# Patient Record
Sex: Female | Born: 1940 | Race: White | Hispanic: No | Smoking: Former smoker
Health system: Southern US, Community
[De-identification: ages and names within clinical notes are randomized; demographics above are authoritative.]

## PROBLEM LIST (undated history)

## (undated) DIAGNOSIS — Z7901 Long term (current) use of anticoagulants: Secondary | ICD-10-CM

## (undated) DIAGNOSIS — M8580 Other specified disorders of bone density and structure, unspecified site: Secondary | ICD-10-CM

## (undated) DIAGNOSIS — R29898 Other symptoms and signs involving the musculoskeletal system: Secondary | ICD-10-CM

## (undated) DIAGNOSIS — E559 Vitamin D deficiency, unspecified: Secondary | ICD-10-CM

## (undated) DIAGNOSIS — K2289 Other specified disease of esophagus: Secondary | ICD-10-CM

## (undated) DIAGNOSIS — T8859XA Other complications of anesthesia, initial encounter: Secondary | ICD-10-CM

## (undated) DIAGNOSIS — N390 Urinary tract infection, site not specified: Secondary | ICD-10-CM

## (undated) DIAGNOSIS — Z9989 Dependence on other enabling machines and devices: Secondary | ICD-10-CM

## (undated) DIAGNOSIS — R0602 Shortness of breath: Secondary | ICD-10-CM

## (undated) DIAGNOSIS — I5189 Other ill-defined heart diseases: Secondary | ICD-10-CM

## (undated) DIAGNOSIS — Z950 Presence of cardiac pacemaker: Secondary | ICD-10-CM

## (undated) DIAGNOSIS — F509 Eating disorder, unspecified: Secondary | ICD-10-CM

## (undated) DIAGNOSIS — J8410 Pulmonary fibrosis, unspecified: Secondary | ICD-10-CM

## (undated) DIAGNOSIS — R7689 Other specified abnormal immunological findings in serum: Secondary | ICD-10-CM

## (undated) DIAGNOSIS — R918 Other nonspecific abnormal finding of lung field: Secondary | ICD-10-CM

## (undated) DIAGNOSIS — G4733 Obstructive sleep apnea (adult) (pediatric): Secondary | ICD-10-CM

## (undated) DIAGNOSIS — I455 Other specified heart block: Secondary | ICD-10-CM

## (undated) DIAGNOSIS — M549 Dorsalgia, unspecified: Secondary | ICD-10-CM

## (undated) DIAGNOSIS — K219 Gastro-esophageal reflux disease without esophagitis: Secondary | ICD-10-CM

## (undated) DIAGNOSIS — I4891 Unspecified atrial fibrillation: Secondary | ICD-10-CM

## (undated) DIAGNOSIS — Z148 Genetic carrier of other disease: Secondary | ICD-10-CM

## (undated) DIAGNOSIS — M1990 Unspecified osteoarthritis, unspecified site: Secondary | ICD-10-CM

## (undated) DIAGNOSIS — R768 Other specified abnormal immunological findings in serum: Secondary | ICD-10-CM

## (undated) DIAGNOSIS — I700 Atherosclerosis of aorta: Secondary | ICD-10-CM

## (undated) DIAGNOSIS — F32A Depression, unspecified: Secondary | ICD-10-CM

## (undated) DIAGNOSIS — I495 Sick sinus syndrome: Secondary | ICD-10-CM

## (undated) DIAGNOSIS — I480 Paroxysmal atrial fibrillation: Secondary | ICD-10-CM

## (undated) DIAGNOSIS — E88819 Insulin resistance, unspecified: Secondary | ICD-10-CM

## (undated) DIAGNOSIS — G459 Transient cerebral ischemic attack, unspecified: Secondary | ICD-10-CM

## (undated) DIAGNOSIS — E669 Obesity, unspecified: Secondary | ICD-10-CM

## (undated) DIAGNOSIS — I341 Nonrheumatic mitral (valve) prolapse: Secondary | ICD-10-CM

## (undated) DIAGNOSIS — K228 Other specified diseases of esophagus: Secondary | ICD-10-CM

## (undated) DIAGNOSIS — I493 Ventricular premature depolarization: Secondary | ICD-10-CM

## (undated) DIAGNOSIS — T4145XA Adverse effect of unspecified anesthetic, initial encounter: Secondary | ICD-10-CM

## (undated) DIAGNOSIS — I639 Cerebral infarction, unspecified: Secondary | ICD-10-CM

## (undated) DIAGNOSIS — I519 Heart disease, unspecified: Secondary | ICD-10-CM

## (undated) DIAGNOSIS — M19041 Primary osteoarthritis, right hand: Secondary | ICD-10-CM

## (undated) DIAGNOSIS — E8881 Metabolic syndrome: Secondary | ICD-10-CM

## (undated) DIAGNOSIS — G4730 Sleep apnea, unspecified: Secondary | ICD-10-CM

## (undated) DIAGNOSIS — M19042 Primary osteoarthritis, left hand: Secondary | ICD-10-CM

## (undated) DIAGNOSIS — E781 Pure hyperglyceridemia: Secondary | ICD-10-CM

## (undated) DIAGNOSIS — I10 Essential (primary) hypertension: Secondary | ICD-10-CM

## (undated) HISTORY — DX: Vitamin D deficiency, unspecified: E55.9

## (undated) HISTORY — DX: Presence of cardiac pacemaker: Z95.0

## (undated) HISTORY — DX: Long term (current) use of anticoagulants: Z79.01

## (undated) HISTORY — DX: Other specified disorders of bone density and structure, unspecified site: M85.80

## (undated) HISTORY — DX: Paroxysmal atrial fibrillation: I48.0

## (undated) HISTORY — DX: Other symptoms and signs involving the musculoskeletal system: R29.898

## (undated) HISTORY — DX: Pure hyperglyceridemia: E78.1

## (undated) HISTORY — DX: Pulmonary fibrosis, unspecified: J84.10

## (undated) HISTORY — DX: Depression, unspecified: F32.A

## (undated) HISTORY — DX: Obstructive sleep apnea (adult) (pediatric): G47.33

## (undated) HISTORY — PX: ESOPHAGEAL DILATION: SHX303

## (undated) HISTORY — DX: Shortness of breath: R06.02

## (undated) HISTORY — PX: TUBAL LIGATION: SHX77

## (undated) HISTORY — DX: Genetic carrier of other disease: Z14.8

## (undated) HISTORY — DX: Dependence on other enabling machines and devices: Z99.89

## (undated) HISTORY — DX: Dorsalgia, unspecified: M54.9

## (undated) HISTORY — DX: Other specified abnormal immunological findings in serum: R76.89

## (undated) HISTORY — DX: Other nonspecific abnormal finding of lung field: R91.8

## (undated) HISTORY — DX: Sleep apnea, unspecified: G47.30

## (undated) HISTORY — DX: Ventricular premature depolarization: I49.3

## (undated) HISTORY — DX: Other ill-defined heart diseases: I51.89

## (undated) HISTORY — PX: EXCISION VAGINAL CYST: SHX5825

## (undated) HISTORY — DX: Atherosclerosis of aorta: I70.0

## (undated) HISTORY — DX: Unspecified osteoarthritis, unspecified site: M19.90

## (undated) HISTORY — DX: Insulin resistance, unspecified: E88.819

## (undated) HISTORY — DX: Primary osteoarthritis, right hand: M19.041

## (undated) HISTORY — DX: Obesity, unspecified: E66.9

## (undated) HISTORY — DX: Eating disorder, unspecified: F50.9

## (undated) HISTORY — DX: Nonrheumatic mitral (valve) prolapse: I34.1

## (undated) HISTORY — DX: Heart disease, unspecified: I51.9

## (undated) HISTORY — DX: Primary osteoarthritis, right hand: M19.042

## (undated) HISTORY — DX: Metabolic syndrome: E88.81

## (undated) HISTORY — DX: Other specified disease of esophagus: K22.89

## (undated) HISTORY — DX: Other specified abnormal immunological findings in serum: R76.8

## (undated) HISTORY — DX: Transient cerebral ischemic attack, unspecified: G45.9

---
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
## 1998-10-31 ENCOUNTER — Other Ambulatory Visit: Admission: RE | Admit: 1998-10-31 | Discharge: 1998-10-31 | Payer: Self-pay | Admitting: Obstetrics & Gynecology

## 1999-08-10 ENCOUNTER — Ambulatory Visit (HOSPITAL_COMMUNITY): Admission: RE | Admit: 1999-08-10 | Discharge: 1999-08-10 | Payer: Self-pay | Admitting: Gastroenterology

## 1999-09-27 ENCOUNTER — Encounter (INDEPENDENT_AMBULATORY_CARE_PROVIDER_SITE_OTHER): Payer: Self-pay

## 1999-09-27 ENCOUNTER — Other Ambulatory Visit: Admission: RE | Admit: 1999-09-27 | Discharge: 1999-09-27 | Payer: Self-pay | Admitting: Obstetrics & Gynecology

## 2000-06-10 ENCOUNTER — Other Ambulatory Visit: Admission: RE | Admit: 2000-06-10 | Discharge: 2000-06-10 | Payer: Self-pay | Admitting: Obstetrics & Gynecology

## 2000-08-29 ENCOUNTER — Other Ambulatory Visit: Admission: RE | Admit: 2000-08-29 | Discharge: 2000-08-29 | Payer: Self-pay | Admitting: Gastroenterology

## 2000-09-02 ENCOUNTER — Other Ambulatory Visit: Admission: RE | Admit: 2000-09-02 | Discharge: 2000-09-02 | Payer: Self-pay | Admitting: Obstetrics and Gynecology

## 2001-07-28 ENCOUNTER — Other Ambulatory Visit: Admission: RE | Admit: 2001-07-28 | Discharge: 2001-07-28 | Payer: Self-pay | Admitting: Obstetrics & Gynecology

## 2003-01-11 ENCOUNTER — Other Ambulatory Visit: Admission: RE | Admit: 2003-01-11 | Discharge: 2003-01-11 | Payer: Self-pay | Admitting: Obstetrics & Gynecology

## 2004-01-16 ENCOUNTER — Other Ambulatory Visit: Admission: RE | Admit: 2004-01-16 | Discharge: 2004-01-16 | Payer: Self-pay | Admitting: Obstetrics & Gynecology

## 2004-03-05 ENCOUNTER — Encounter: Admission: RE | Admit: 2004-03-05 | Discharge: 2004-03-05 | Payer: Self-pay | Admitting: Obstetrics & Gynecology

## 2005-01-30 ENCOUNTER — Other Ambulatory Visit: Admission: RE | Admit: 2005-01-30 | Discharge: 2005-01-30 | Payer: Self-pay | Admitting: Obstetrics & Gynecology

## 2005-05-09 ENCOUNTER — Ambulatory Visit: Payer: Self-pay | Admitting: Cardiology

## 2006-08-01 ENCOUNTER — Ambulatory Visit: Payer: Self-pay | Admitting: Cardiology

## 2006-09-11 ENCOUNTER — Encounter: Admission: RE | Admit: 2006-09-11 | Discharge: 2006-09-11 | Payer: Self-pay | Admitting: Orthopedic Surgery

## 2007-02-19 ENCOUNTER — Ambulatory Visit: Payer: Self-pay | Admitting: Cardiology

## 2007-10-09 ENCOUNTER — Ambulatory Visit: Payer: Self-pay | Admitting: Internal Medicine

## 2007-10-14 ENCOUNTER — Ambulatory Visit: Payer: Self-pay | Admitting: Internal Medicine

## 2007-10-19 ENCOUNTER — Telehealth: Payer: Self-pay | Admitting: Internal Medicine

## 2007-10-20 ENCOUNTER — Telehealth: Payer: Self-pay | Admitting: Internal Medicine

## 2007-12-18 HISTORY — PX: NM MYOCAR PERF WALL MOTION: HXRAD629

## 2008-01-07 DIAGNOSIS — Z8679 Personal history of other diseases of the circulatory system: Secondary | ICD-10-CM | POA: Insufficient documentation

## 2008-01-07 DIAGNOSIS — J40 Bronchitis, not specified as acute or chronic: Secondary | ICD-10-CM

## 2008-01-07 DIAGNOSIS — J209 Acute bronchitis, unspecified: Secondary | ICD-10-CM | POA: Insufficient documentation

## 2008-01-12 ENCOUNTER — Encounter: Payer: Self-pay | Admitting: Internal Medicine

## 2008-07-15 ENCOUNTER — Encounter: Payer: Self-pay | Admitting: Internal Medicine

## 2008-07-19 ENCOUNTER — Encounter: Admission: RE | Admit: 2008-07-19 | Discharge: 2008-07-19 | Payer: Self-pay | Admitting: Gastroenterology

## 2008-08-15 DIAGNOSIS — M129 Arthropathy, unspecified: Secondary | ICD-10-CM | POA: Insufficient documentation

## 2008-09-09 ENCOUNTER — Ambulatory Visit: Payer: Self-pay | Admitting: Pulmonary Disease

## 2010-05-15 HISTORY — PX: US ECHOCARDIOGRAPHY: HXRAD669

## 2010-10-01 ENCOUNTER — Encounter: Admission: RE | Admit: 2010-10-01 | Discharge: 2010-10-01 | Payer: Self-pay | Admitting: Gastroenterology

## 2010-12-22 ENCOUNTER — Encounter: Payer: Self-pay | Admitting: Gastroenterology

## 2011-04-16 NOTE — Assessment & Plan Note (Signed)
[facility]                             PULMONARY OFFICE NOTE   NAME:[name] [name], [name] [name]                        MRN:          [id number]  DATE:10/09/2007                            DOB:          12-14-1940    PROBLEM:  This is a 70 year old woman previously followed at my old  office where she was last seen around 2002 coming now as a new patient  to establish because of bronchitis. She sees Dr. Berneta Sages for primary  care and Dr. Mar Daring for cardiology. She is an alpha-1 heterozygote  (MZ) and her daughter is followed by me for alpha-1 antitrypsin  deficiency with severe chronic obstructive pulmonary disease. The  patient used to smoke, quitting years ago. She has noted increased  shortness of breath in the last 3 to 4 months, such that it has gotten  in the way of her water aerobics. Onset has been gradual and associated  with a nearly 30 pound weight gain. 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 She had a  history of cardiac arrhythmia controlled with Toprol. Occasional  palpitations, not much. Her feet do not swell. There is no chest pain.  She sleeps on one pillow with no increase in nocturia. She may feel a  little heavier in the chest, but nothing specific. Minor congestion in  the last few weeks, mainly in her head, blamed on weather change, and  her right ear hurts her a little. She says that she goes to a gym, but  admits mostly she is just eating more.   MEDICATIONS:  1. Folic acid.  2. MiraLax.  3. Prilosec 20 mg.  4. Toprol 25 mg.  5. Zoloft 25 mg.  6. Esterase.  7. Aspirin 81 mg daily.  8. Omega-3.  9. CoQ 10.  10.Vitamin D.  11.Calcium.  12.Tumeric.   ALLERGIES:  No medication allergy.   OBJECTIVE:  Weight 230 pounds compared with 202 pounds when recorded by  cardiology in March and 192 pounds when reported by cardiology in 2006.  Blood pressure 118/72, pulse 63, room air  saturation 97%. Alert, calm,  apparently comfortable overweight woman. Lung fields are clear.  Diaphragm excursion seems normal. There is no dullness. Heart sounds are  regular without murmur or gallop. I find no neck vein distension, no  peripheral edema, no cyanosis or clubbing. Homan's negative. Tympanic  membranes are little retracted. Heart sounds are slow without murmur.   IMPRESSION:  1. Dyspnea seems mostly related to deconditioning and weight gain.  2. Eustachian dysfunction.   PLAN:  1. Pulmonary function test.  2. Chest x-ray.  3. I emphasized weight loss and regular gradual resumption of exercise      for endurance and weight loss.  4. Flu vaccine discussed and given.  5. Schedule return 3 months, earlier p.r.n.     Clinton D. Annamaria Boots, MD, [facility], [place]  Electronically Signed    CDY/MedQ  DD: 10/10/2007  DT: 10/11/2007  Job #: [id number]   cc:   Berneta Sages,  M.D. 

## 2011-04-19 NOTE — Assessment & Plan Note (Signed)
[facility]                              CARDIOLOGY OFFICE NOTE   NAME:[name], [name] GASSER                        MRN:          [id number]  DATE:08/01/2006                            DOB:          July 23, 1941    [name] [name] returns today for further management of her palpitations.  She  carries a diagnosis of mitral valve prolapse.  Her last echo showed no  prolapse.  She has had a good year.  She states she is very happy but has  gained a considerable amount of weight.  Her weight is up from 192 to 214  from last June.   She denies any chest pain, shortness of breath, or ischemic symptoms.  She  has had very few palpitations.   She is maintained on Toprol XL 25 mg a day.   She wants to have some eye surgery for some eyelid droop on the left.  She  is going to be operated on by Dr. Penni Homans in [place]:  VITAL SIGNS:  Her blood pressure today is 124/60.  Her pulse 56 and regular.  Weight is 214.  NECK:  Carotids are full without JVD.  There is no thyromegaly.  Trachea is  midline.  LUNGS:  Clear.  HEART:  Reveals a regular rate and rhythm without murmur, rub, or gallop, or  a click.  ABDOMEN:  Soft.  EXTREMITIES:  Reveal no edema.  Pulses are intact.   EKG shows a sinus bradycardia otherwise normal.   ASSESSMENT/PLAN:  [name] [name] is doing well.  I have advised her to get her  weight down before she starts becoming glucose intolerant and has other  obesity related issues.  I have cleared her for surgery on her eye.  I will  see her back in a year.                               Thomas C. Verl Blalock, MD, [facility]    TCW/MedQ  DD:  08/01/2006  DT:  08/02/2006  Job #:  [id number]   cc:   Berneta Sages, MD

## 2011-04-19 NOTE — Assessment & Plan Note (Signed)
[facility]                            CARDIOLOGY OFFICE NOTE   NAME:[name] [name], [name] [name]                        MRN:          [id number]  DATE:02/19/2007                            DOB:          04-Mar-1941    [name] comes in today as an add on because of some palpitations on  Sunday at church.  She thinks she might have missed a couple of doses of  Toprol inadvertently.  She has a pillbox and is very compliant.  She  thinks she may have left the Toprol out.   She carries a diagnosis of mitral valve prolapse, but her last echo at  [facility] showed no prolapse.   She also had some aching in the middle of her shoulder blades.  It was  very well localized and did not radiate.  She had no shortness of  breath, diaphoresis, or nausea or vomiting.   She took her Toprol today and is having a good day.   1. Her only cardiac med is Toprol XL 25 mg a day.  2. She also takes an enteric-coated aspirin 81 mg a day.   EXAM:  Blood pressure 110/70, pulse is 58 and regular.  EKG shows sinus  brady with some low voltage.  Otherwise, normal.  HEENT:  Normocephalic, atraumatic.  PERRLA.  Extraocular movements are  intact.  Sclerae clear.  Facial symmetry is normal.  Carotid upstrokes  are equal bilaterally without bruits.  There is no JVD.  Thyroid is not  enlarged.  Trachea is midline.  LUNGS:  Clear.  HEART:  Nondisplaced PMI.  She has normal S1, S2 without click or  murmur.  ABDOMEN:  Soft with good bowel sounds.  EXTREMITIES:  No cyanosis, clubbing, or edema.  Pulses are intact.  NEURO:  Intact.   ASSESSMENT AND PLAN:  I suspect [name] [name] had an episode of tachy  palpitations.  This may have been related to not taking her Toprol.  We  renewed this today with a year's worth of refills.  I have reassured her  otherwise.     Thomas C. Verl Blalock, MD, [facility]  Electronically Signed    TCW/MedQ  DD: 02/19/2007  DT: 02/19/2007  Job #: [id number]   cc:   [name] [name], M.D.

## 2011-05-08 ENCOUNTER — Other Ambulatory Visit: Payer: Self-pay | Admitting: Orthopedic Surgery

## 2011-05-08 DIAGNOSIS — M25551 Pain in right hip: Secondary | ICD-10-CM

## 2011-05-09 ENCOUNTER — Ambulatory Visit
Admission: RE | Admit: 2011-05-09 | Discharge: 2011-05-09 | Disposition: A | Discharge status: Home / Self Care | Payer: Medicare Other | Source: Ambulatory Visit | Attending: Orthopedic Surgery | Admitting: Orthopedic Surgery

## 2011-05-09 DIAGNOSIS — M25551 Pain in right hip: Secondary | ICD-10-CM

## 2011-11-21 ENCOUNTER — Other Ambulatory Visit (HOSPITAL_COMMUNITY): Payer: Self-pay | Admitting: Family Medicine

## 2011-11-21 DIAGNOSIS — R0600 Dyspnea, unspecified: Secondary | ICD-10-CM

## 2011-12-19 ENCOUNTER — Ambulatory Visit (HOSPITAL_COMMUNITY)
Admission: RE | Admit: 2011-12-19 | Discharge: 2011-12-19 | Disposition: A | Discharge status: Home / Self Care | Payer: Medicare Other | Source: Ambulatory Visit | Attending: Family Medicine | Admitting: Family Medicine

## 2011-12-19 ENCOUNTER — Encounter (HOSPITAL_COMMUNITY): Payer: Medicare Other

## 2011-12-19 DIAGNOSIS — R0989 Other specified symptoms and signs involving the circulatory and respiratory systems: Secondary | ICD-10-CM | POA: Insufficient documentation

## 2011-12-19 DIAGNOSIS — R0609 Other forms of dyspnea: Secondary | ICD-10-CM | POA: Insufficient documentation

## 2012-02-14 ENCOUNTER — Other Ambulatory Visit: Payer: Self-pay | Admitting: Obstetrics & Gynecology

## 2012-12-03 DIAGNOSIS — I059 Rheumatic mitral valve disease, unspecified: Secondary | ICD-10-CM | POA: Diagnosis not present

## 2012-12-21 DIAGNOSIS — N390 Urinary tract infection, site not specified: Secondary | ICD-10-CM | POA: Diagnosis not present

## 2012-12-21 DIAGNOSIS — Z1212 Encounter for screening for malignant neoplasm of rectum: Secondary | ICD-10-CM | POA: Diagnosis not present

## 2012-12-21 DIAGNOSIS — N949 Unspecified condition associated with female genital organs and menstrual cycle: Secondary | ICD-10-CM | POA: Diagnosis not present

## 2012-12-21 DIAGNOSIS — Z124 Encounter for screening for malignant neoplasm of cervix: Secondary | ICD-10-CM | POA: Diagnosis not present

## 2012-12-21 DIAGNOSIS — N72 Inflammatory disease of cervix uteri: Secondary | ICD-10-CM | POA: Diagnosis not present

## 2013-01-11 DIAGNOSIS — L578 Other skin changes due to chronic exposure to nonionizing radiation: Secondary | ICD-10-CM | POA: Diagnosis not present

## 2013-01-11 DIAGNOSIS — L821 Other seborrheic keratosis: Secondary | ICD-10-CM | POA: Diagnosis not present

## 2013-01-11 DIAGNOSIS — L820 Inflamed seborrheic keratosis: Secondary | ICD-10-CM | POA: Diagnosis not present

## 2013-01-22 DIAGNOSIS — J209 Acute bronchitis, unspecified: Secondary | ICD-10-CM | POA: Diagnosis not present

## 2013-02-22 DIAGNOSIS — Z1231 Encounter for screening mammogram for malignant neoplasm of breast: Secondary | ICD-10-CM | POA: Diagnosis not present

## 2013-06-08 DIAGNOSIS — R350 Frequency of micturition: Secondary | ICD-10-CM | POA: Diagnosis not present

## 2013-06-08 DIAGNOSIS — F329 Major depressive disorder, single episode, unspecified: Secondary | ICD-10-CM | POA: Diagnosis not present

## 2013-06-08 DIAGNOSIS — Z13220 Encounter for screening for lipoid disorders: Secondary | ICD-10-CM | POA: Diagnosis not present

## 2013-06-08 DIAGNOSIS — I471 Supraventricular tachycardia: Secondary | ICD-10-CM | POA: Diagnosis not present

## 2013-06-08 DIAGNOSIS — D126 Benign neoplasm of colon, unspecified: Secondary | ICD-10-CM | POA: Diagnosis not present

## 2013-06-08 DIAGNOSIS — Z0000 Encounter for general adult medical examination without abnormal findings: Secondary | ICD-10-CM | POA: Diagnosis not present

## 2013-06-08 DIAGNOSIS — F3289 Other specified depressive episodes: Secondary | ICD-10-CM | POA: Diagnosis not present

## 2013-06-16 ENCOUNTER — Telehealth: Payer: Self-pay | Admitting: Cardiovascular Disease

## 2013-06-16 MED ORDER — METOPROLOL SUCCINATE ER 25 MG PO TB24: ORAL_TABLET | ORAL | Status: DC

## 2013-06-16 NOTE — Telephone Encounter (Signed)
Returned call.  Pt stated Dr. Loletha Grayer has her on Toprol for tachycardia and he told her she can take more if she needs to.  Stated she went to get a refill and was told it couldn't be refilled until August 3rd.  Pt asking for 120-days worth of meds.  Pt informed 90-day supply can be sent in, but will need to clarify w/ Dr. Sallyanne Kuster.  Pt verbalized understanding and agreed w/ plan.  Dr. Sallyanne Kuster notified and advised pt can take an extra 1/2 tab prn.  Call to pt and informed.  Verbalized understanding and Rx sent to [facility] per pt request.

## 2013-06-16 NOTE — Telephone Encounter (Signed)
Please call-said message was too long-just better that she talked to you!

## 2013-06-17 ENCOUNTER — Telehealth: Payer: Self-pay | Admitting: Cardiovascular Disease

## 2013-06-17 NOTE — Telephone Encounter (Signed)
PT WAS CHECKING ON HER MEDICINE AT [facility] IT WAS NOT THERE-SOUND OUT IT [place] UP!

## 2013-07-16 DIAGNOSIS — R300 Dysuria: Secondary | ICD-10-CM | POA: Diagnosis not present

## 2013-07-16 DIAGNOSIS — Z0000 Encounter for general adult medical examination without abnormal findings: Secondary | ICD-10-CM | POA: Diagnosis not present

## 2013-07-16 DIAGNOSIS — H68009 Unspecified Eustachian salpingitis, unspecified ear: Secondary | ICD-10-CM | POA: Diagnosis not present

## 2013-07-30 DIAGNOSIS — Z23 Encounter for immunization: Secondary | ICD-10-CM | POA: Diagnosis not present

## 2013-09-16 DIAGNOSIS — M629 Disorder of muscle, unspecified: Secondary | ICD-10-CM | POA: Diagnosis not present

## 2013-09-16 DIAGNOSIS — IMO0001 Reserved for inherently not codable concepts without codable children: Secondary | ICD-10-CM | POA: Diagnosis not present

## 2013-09-16 DIAGNOSIS — M62838 Other muscle spasm: Secondary | ICD-10-CM | POA: Diagnosis not present

## 2013-09-16 DIAGNOSIS — M999 Biomechanical lesion, unspecified: Secondary | ICD-10-CM | POA: Diagnosis not present

## 2013-09-20 DIAGNOSIS — IMO0001 Reserved for inherently not codable concepts without codable children: Secondary | ICD-10-CM | POA: Diagnosis not present

## 2013-09-20 DIAGNOSIS — M999 Biomechanical lesion, unspecified: Secondary | ICD-10-CM | POA: Diagnosis not present

## 2013-09-20 DIAGNOSIS — M62838 Other muscle spasm: Secondary | ICD-10-CM | POA: Diagnosis not present

## 2013-09-20 DIAGNOSIS — M629 Disorder of muscle, unspecified: Secondary | ICD-10-CM | POA: Diagnosis not present

## 2013-09-24 DIAGNOSIS — M629 Disorder of muscle, unspecified: Secondary | ICD-10-CM | POA: Diagnosis not present

## 2013-09-24 DIAGNOSIS — IMO0001 Reserved for inherently not codable concepts without codable children: Secondary | ICD-10-CM | POA: Diagnosis not present

## 2013-09-24 DIAGNOSIS — M62838 Other muscle spasm: Secondary | ICD-10-CM | POA: Diagnosis not present

## 2013-09-24 DIAGNOSIS — M999 Biomechanical lesion, unspecified: Secondary | ICD-10-CM | POA: Diagnosis not present

## 2013-10-01 DIAGNOSIS — IMO0001 Reserved for inherently not codable concepts without codable children: Secondary | ICD-10-CM | POA: Diagnosis not present

## 2013-10-01 DIAGNOSIS — M999 Biomechanical lesion, unspecified: Secondary | ICD-10-CM | POA: Diagnosis not present

## 2013-10-01 DIAGNOSIS — M62838 Other muscle spasm: Secondary | ICD-10-CM | POA: Diagnosis not present

## 2013-10-01 DIAGNOSIS — M629 Disorder of muscle, unspecified: Secondary | ICD-10-CM | POA: Diagnosis not present

## 2013-10-08 DIAGNOSIS — M62838 Other muscle spasm: Secondary | ICD-10-CM | POA: Diagnosis not present

## 2013-10-08 DIAGNOSIS — M999 Biomechanical lesion, unspecified: Secondary | ICD-10-CM | POA: Diagnosis not present

## 2013-10-08 DIAGNOSIS — IMO0001 Reserved for inherently not codable concepts without codable children: Secondary | ICD-10-CM | POA: Diagnosis not present

## 2013-10-08 DIAGNOSIS — M629 Disorder of muscle, unspecified: Secondary | ICD-10-CM | POA: Diagnosis not present

## 2013-10-19 ENCOUNTER — Telehealth: Payer: Self-pay | Admitting: Cardiovascular Disease

## 2013-10-19 MED ORDER — METOPROLOL SUCCINATE ER 50 MG PO TB24: ORAL_TABLET | ORAL | Status: DC

## 2013-10-19 NOTE — Telephone Encounter (Signed)
Last time her prescription was wrong. She need Metoprolol 50 mg #90

## 2013-10-19 NOTE — Telephone Encounter (Signed)
Returned call and pt verified x 2.  Pt requesting Rx for metoprolol for 50 mg tabs instead of 25 mg tabs.  Stated it costs her the same thing and she can split them in half.  Pt informed Rx can be sent, but she will NOT be able to 1/4 the pill for the additional 12.5 mg Dr. Sallyanne Kuster has authorized.  Pt stated she still has the 25 mg tabs and can use them.  Pt informed if Rx sent for 50 mg tabs, then she will have to use the 1/2 of the 25 mg tabs to get the 12.5 mg prn dose.  Pt verbalized understanding and agreed w/ plan.    Rx sent to pharmacy per pt request for #90 w/o refills and appt due in January.  Pt aware.

## 2013-10-21 DIAGNOSIS — F430 Acute stress reaction: Secondary | ICD-10-CM | POA: Diagnosis not present

## 2013-10-21 DIAGNOSIS — I10 Essential (primary) hypertension: Secondary | ICD-10-CM | POA: Diagnosis not present

## 2013-10-21 DIAGNOSIS — R413 Other amnesia: Secondary | ICD-10-CM | POA: Diagnosis not present

## 2013-11-08 DIAGNOSIS — F4323 Adjustment disorder with mixed anxiety and depressed mood: Secondary | ICD-10-CM | POA: Diagnosis not present

## 2013-11-19 DIAGNOSIS — N390 Urinary tract infection, site not specified: Secondary | ICD-10-CM | POA: Diagnosis not present

## 2013-11-19 DIAGNOSIS — N760 Acute vaginitis: Secondary | ICD-10-CM | POA: Diagnosis not present

## 2013-11-24 ENCOUNTER — Telehealth: Payer: Self-pay | Admitting: Cardiovascular Disease

## 2013-11-24 NOTE — Telephone Encounter (Signed)
Patient states that he blood pressure yesterday was 182/101 and this am it was 170/99.  Please call.

## 2013-11-24 NOTE — Telephone Encounter (Signed)
Returned call and informed pt per instructions by MD.  Pt verbalized understanding and stated she is not in any pain or have any stress.  Stated she doesn't know why her BP is up and why MD doesn't think it's a concern.  Pt informed she did state that she took Valium to calm herself down and that may have caused MD to look at stress as a factor.  Pt informed she can come in for BP check w/ pharmacist or PA/NP on Monday and advised she continue to monitor BP over the rest of the week and weekend.  Pt stated she doesn't see why the doctor doesn't want to see her.  Pt informed Dr. Loletha Grayer did not state he did not want to see her and informed his next available appt is in mid-January.  Pt informed this is why the other options were advised by RN.  Pt verbalized understanding and stated she will wait until after Christmas to call back for appt.  Pt again advised to continue to monitor BP and go to ER for severe pain or other symptoms.  Pt verbalized understanding and agreed w/ plan.

## 2013-11-24 NOTE — Telephone Encounter (Signed)
Returned call and pt verified x 2.  Pt stated she has never had problems w/ her BP and had HA yesterday.  Stated she took her BP and it was 182/101 and she took another dose of Toprol and Valium to calm her down.  Stated when she rechecked BP it was 133/79 later yesterday evening.  Stated BP was 170/99 this morning.  Pt rechecked BP while on phone and BP 152/90 HR 52.  Stated she took an extra metoprolol (total of 50 mg) about 45 mins ago.  Pt c/o a "feeling" at the top of her head and denied blurred vision or dizziness.  Pt also c/o a bluish tinge around her mouth, under her nose.  Stated she has started two new medicines, one is a vaginal cream.  RN asked pt to name meds: clobetasol propionate and nystatin/triamcinolone ointment and cipro.  Pt informed Dr. Sallyanne Kuster will be notified for further instructions and RN will call her back.  Pt verbalized understanding and agreed w/ plan.  Message forwarded to Dr. Sallyanne Kuster.

## 2013-11-24 NOTE — Telephone Encounter (Signed)
Neither one of those new meds should increase her BP. BP will increase if she is in pain/distress. Go to ED if her headache is severe. Should not take more than on eextra metoprolol dose (as she has done) since it also slows down her heart rate

## 2013-11-29 ENCOUNTER — Telehealth: Payer: Self-pay | Admitting: *Deleted

## 2013-11-29 NOTE — Telephone Encounter (Signed)
Pt was calling in regards to her elevated BP she stated that she was supposed to monitor it this weekend and call back today to speak to someone. Was also told to double her dose of Metoprolol.  [place]

## 2013-11-29 NOTE — Telephone Encounter (Signed)
Returned call and pt verified x 2.  Pt stated BP was 168/94 and 166/87.  Pt stated she was supposed to call back to be seen.  RN offered BP check today w/ NP or pharmacist and pt declined as she would rather see a PA.  Appt scheduled for Wednesday, 12.31.14 at 1:40pm w/ Kerin Ransom, PA-C for BP check.

## 2013-12-01 ENCOUNTER — Ambulatory Visit (INDEPENDENT_AMBULATORY_CARE_PROVIDER_SITE_OTHER): Payer: Medicare Other | Admitting: Cardiology

## 2013-12-01 ENCOUNTER — Encounter: Payer: Self-pay | Admitting: Cardiology

## 2013-12-01 VITALS — BP 130/84 | HR 56 | Ht 66.5 in | Wt 235.9 lb

## 2013-12-01 DIAGNOSIS — E669 Obesity, unspecified: Secondary | ICD-10-CM

## 2013-12-01 DIAGNOSIS — I10 Essential (primary) hypertension: Secondary | ICD-10-CM

## 2013-12-01 DIAGNOSIS — Z8679 Personal history of other diseases of the circulatory system: Secondary | ICD-10-CM | POA: Diagnosis not present

## 2013-12-01 NOTE — Progress Notes (Signed)
12/01/2013 [name] [name]   [birth year]  [id number]  Primary Physicia Reginia Naas, MD Primary Cardiologist: Dr Sallyanne Kuster  HPI:  72 y/o pleasant female followed in our practice for years. She remarried 3 years ago, they met on [address]. Her husband accompanied her today.She has been seen by Dr Rollene Fare, Dr Gwenlyn Found, and Dr Claiborne Billings in the past.  She now sees Dr Sallyanne Kuster. She has a history of palpitations. A Myoview in 2009 was negative, an echo in 2011 was normal. She takes Toprol 25 mg daily and CoQ10 which has helped her symptoms.           She and her husband recently attended a health screening and the pt was told she was "at increased risk for a stroke", she is not sure why they told her that. She started checking her B/P at home and noted systolic pressures as high as 076 and diastolic pressures as high as 100. She called the office and her Toprol was increased to 50 mg daily and she was given this appointment. She denies exertional chest pressure or SOB. Her B/P has been under better control with the increased dose of Toprol.    Current Outpatient Prescriptions  Medication Sig Dispense Refill  . aspirin EC 81 MG tablet Take 81 mg by mouth daily.      . Calcium Carbonate-Vit D-Min (CALCIUM 1200 PO) Take 800 mg by mouth daily.      . cholecalciferol (VITAMIN D) 1000 UNITS tablet Take 1,000 Units by mouth daily. 2 daily      . co-enzyme Q-10 30 MG capsule Take 30 mg by mouth daily.      Marland Kitchen estradiol (ESTRACE) 0.1 MG/GM vaginal cream Place 1 Applicatorful vaginally at bedtime.      . folic acid (FOLVITE) 1 MG tablet Take 1 mg by mouth daily.      Marland Kitchen glucosamine-chondroitin 500-400 MG tablet Take 1 tablet by mouth 3 (three) times daily.      Marland Kitchen loratadine (CLARITIN) 10 MG tablet Take 10 mg by mouth daily.      . metoprolol succinate (TOPROL-XL) 50 MG 24 hr tablet Take 50 mg by mouth daily. Take 1/2 tab daily and as directed. Need January appointment.      Marland Kitchen omega-3 acid ethyl esters (LOVAZA)  1 G capsule Take 1 g by mouth daily.      Marland Kitchen omeprazole (PRILOSEC) 20 MG capsule Take 20 mg by mouth daily.      . sertraline (ZOLOFT) 50 MG tablet Take 50 mg by mouth daily.      . simethicone (MYLICON) 226 MG chewable tablet Chew 125 mg by mouth every 6 (six) hours as needed for flatulence.      . vitamin E 100 UNIT capsule Take 800 Units by mouth daily.      . Clobetasol Prop Emollient Base 0.05 % emollient cream        No current facility-administered medications for this visit.    Allergies  Allergen Reactions  . Caffeine Palpitations    History   Social History  . Marital Status: Married    Spouse Name: N/A    Number of Children: N/A  . Years of Education: N/A   Occupational History  . Not on file.   Social History Main Topics  . Smoking status: Former Smoker -- 2.00 packs/day for 18 years    Types: Cigarettes  . Smokeless tobacco: Not on file  . Alcohol Use: Not on file  . Drug Use: Not  on file  . Sexual Activity: Not on file   Other Topics Concern  . Not on file   Social History Narrative  . No narrative on file     Review of Systems: General: negative for chills, fever, night sweats or weight changes.  Cardiovascular: negative for chest pain, dyspnea on exertion, edema, orthopnea, palpitations, paroxysmal nocturnal dyspnea or shortness of breath Dermatological: negative for rash Respiratory: negative for cough or wheezing Urologic: negative for hematuria Abdominal: negative for nausea, vomiting, diarrhea, bright red blood per rectum, melena, or hematemesis Neurologic: negative for visual changes, syncope, or dizziness She has GERD and DJD She does snore and admits to daytime sleepiness and fatigue.  Her husband has sleep apnea and thinks she has similar symptoms and needs to be checked. All other systems reviewed and are otherwise negative except as noted above.    Blood pressure 130/84, pulse 56, height 5' 6.5" (1.689 m), weight 235 lb 14.4 oz (107.004  kg).  General appearance: alert, cooperative, no distress and moderately obese Neck: no carotid bruit and no JVD Lungs: clear to auscultation bilaterally Heart: regular rate and rhythm Extremities: no edema  EKG NSR without acute changes  ASSESSMENT AND PLAN:   HTN (hypertension) Her B/P has recently been elevated  PALPITATIONS, HX OF Currently stable  Obesity (BMI 30-39.9) We discussed wgt loss.    PLAN: For now we will continue her current dose of Toprol. She will keep track of her B/P at home. 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 I suggested a sleep study and she will contact us about this in the future. I will arrange for her to follow up with Dr Sallyanne Kuster in three months. She may need a second agent if her B/P is till elevated - possibly a thiazide diuretic.   [facility] KPA-C 12/01/2013 2:53 PM

## 2013-12-01 NOTE — Assessment & Plan Note (Signed)
Currently stable.

## 2013-12-01 NOTE — Assessment & Plan Note (Signed)
We discussed wgt loss.

## 2013-12-01 NOTE — Assessment & Plan Note (Signed)
Her B/P has recently been elevated

## 2013-12-01 NOTE — Patient Instructions (Addendum)
Continue Toprol XL 50 mg daily. Low salt diet, low fat diet, walking for exercise. Check you blood pressure 3-4 times a week at different times of the day. Call if your top number consistently runs higher that 160, or your bottom number runs higher than 100. Your physician recommends that you schedule a follow-up appointment in:3 months with Dr Sallyanne Kuster Call if you are ready to have a sleep study

## 2013-12-03 ENCOUNTER — Encounter: Payer: Self-pay | Admitting: Cardiology

## 2013-12-14 DIAGNOSIS — F4323 Adjustment disorder with mixed anxiety and depressed mood: Secondary | ICD-10-CM | POA: Diagnosis not present

## 2013-12-23 DIAGNOSIS — F4323 Adjustment disorder with mixed anxiety and depressed mood: Secondary | ICD-10-CM | POA: Diagnosis not present

## 2013-12-25 ENCOUNTER — Other Ambulatory Visit: Payer: Self-pay | Admitting: Cardiovascular Disease

## 2013-12-27 NOTE — Telephone Encounter (Signed)
Rx was sent to pharmacy electronically.

## 2013-12-28 DIAGNOSIS — Z130 Encounter for screening for diseases of the blood and blood-forming organs and certain disorders involving the immune mechanism: Secondary | ICD-10-CM | POA: Diagnosis not present

## 2013-12-28 DIAGNOSIS — Z1212 Encounter for screening for malignant neoplasm of rectum: Secondary | ICD-10-CM | POA: Diagnosis not present

## 2013-12-28 DIAGNOSIS — N390 Urinary tract infection, site not specified: Secondary | ICD-10-CM | POA: Diagnosis not present

## 2014-01-04 DIAGNOSIS — H2589 Other age-related cataract: Secondary | ICD-10-CM | POA: Diagnosis not present

## 2014-01-11 DIAGNOSIS — F4323 Adjustment disorder with mixed anxiety and depressed mood: Secondary | ICD-10-CM | POA: Diagnosis not present

## 2014-01-17 DIAGNOSIS — F4323 Adjustment disorder with mixed anxiety and depressed mood: Secondary | ICD-10-CM | POA: Diagnosis not present

## 2014-01-30 DIAGNOSIS — I639 Cerebral infarction, unspecified: Secondary | ICD-10-CM

## 2014-01-30 HISTORY — DX: Cerebral infarction, unspecified: I63.9

## 2014-02-01 ENCOUNTER — Other Ambulatory Visit: Payer: Self-pay

## 2014-02-01 MED ORDER — METOPROLOL SUCCINATE ER 50 MG PO TB24: 50.0000 mg | ORAL_TABLET | Freq: Every day | ORAL | Status: DC

## 2014-02-01 NOTE — Telephone Encounter (Signed)
Rx was sent to pharmacy electronically.

## 2014-02-08 ENCOUNTER — Telehealth: Payer: Self-pay | Admitting: Cardiovascular Disease

## 2014-02-08 NOTE — Telephone Encounter (Signed)
Please call,she is unable to get her Metoprolol.Please call today. Her medicine was increased and that have made her use more medicine.

## 2014-02-08 NOTE — Telephone Encounter (Signed)
Returned call and pt verified x 2.  Pt stated she lives in [place] and [place] and the last time she was seen by the PA her Toprol was increased to a "full dose."  Stated [facility] said they don't have the prescription on file and she needs a refill.  Pt informed records indicate she should be taking Toprol 50 mg daily and script was last sent for 90-day supply on 3.3.15 w/ refills.  Pt informed she can call pharmacy to make sure she is not requesting a refill on her old Rx w/ previous dose instructions.  Pt stated she is not in [place] now, but in [place].  Pt informed she can have Rx transferred to a [facility] near her and pt stated there isn't one.  Pt advised to have Rx transferred to pharmacy of her choice as it has refills.  Pt verbalized understanding and agreed w/ plan.  Number to [facility] given to pt.

## 2014-02-09 DIAGNOSIS — F4323 Adjustment disorder with mixed anxiety and depressed mood: Secondary | ICD-10-CM | POA: Diagnosis not present

## 2014-02-16 DIAGNOSIS — F4323 Adjustment disorder with mixed anxiety and depressed mood: Secondary | ICD-10-CM | POA: Diagnosis not present

## 2014-02-23 DIAGNOSIS — Z1231 Encounter for screening mammogram for malignant neoplasm of breast: Secondary | ICD-10-CM | POA: Diagnosis not present

## 2014-03-08 ENCOUNTER — Emergency Department (HOSPITAL_COMMUNITY): Payer: Medicare Other

## 2014-03-08 ENCOUNTER — Observation Stay (HOSPITAL_COMMUNITY)
Admission: EM | Admit: 2014-03-08 | Discharge: 2014-03-09 | Disposition: A | Discharge status: Home / Self Care | Payer: Medicare Other | Attending: Internal Medicine | Admitting: Internal Medicine

## 2014-03-08 ENCOUNTER — Encounter (HOSPITAL_COMMUNITY): Payer: Self-pay | Admitting: Radiology

## 2014-03-08 DIAGNOSIS — I491 Atrial premature depolarization: Secondary | ICD-10-CM | POA: Insufficient documentation

## 2014-03-08 DIAGNOSIS — I499 Cardiac arrhythmia, unspecified: Secondary | ICD-10-CM | POA: Diagnosis not present

## 2014-03-08 DIAGNOSIS — Z79899 Other long term (current) drug therapy: Secondary | ICD-10-CM | POA: Insufficient documentation

## 2014-03-08 DIAGNOSIS — R29818 Other symptoms and signs involving the nervous system: Secondary | ICD-10-CM | POA: Diagnosis not present

## 2014-03-08 DIAGNOSIS — R300 Dysuria: Secondary | ICD-10-CM

## 2014-03-08 DIAGNOSIS — G459 Transient cerebral ischemic attack, unspecified: Principal | ICD-10-CM | POA: Insufficient documentation

## 2014-03-08 DIAGNOSIS — E669 Obesity, unspecified: Secondary | ICD-10-CM

## 2014-03-08 DIAGNOSIS — I10 Essential (primary) hypertension: Secondary | ICD-10-CM | POA: Insufficient documentation

## 2014-03-08 DIAGNOSIS — Z87891 Personal history of nicotine dependence: Secondary | ICD-10-CM | POA: Insufficient documentation

## 2014-03-08 DIAGNOSIS — R4789 Other speech disturbances: Secondary | ICD-10-CM | POA: Diagnosis not present

## 2014-03-08 DIAGNOSIS — M129 Arthropathy, unspecified: Secondary | ICD-10-CM | POA: Diagnosis not present

## 2014-03-08 DIAGNOSIS — Z7982 Long term (current) use of aspirin: Secondary | ICD-10-CM | POA: Diagnosis not present

## 2014-03-08 DIAGNOSIS — I6789 Other cerebrovascular disease: Secondary | ICD-10-CM | POA: Diagnosis not present

## 2014-03-08 HISTORY — DX: Unspecified osteoarthritis, unspecified site: M19.90

## 2014-03-08 HISTORY — DX: Essential (primary) hypertension: I10

## 2014-03-08 LAB — I-STAT TROPONIN, ED: Troponin i, poc: 0.01 ng/mL (ref 0.00–0.08)

## 2014-03-08 LAB — CBC
HEMATOCRIT: 44.9 % (ref 36.0–46.0)
HEMOGLOBIN: 15.6 g/dL — AB (ref 12.0–15.0)
MCH: 30.6 pg (ref 26.0–34.0)
MCHC: 34.7 g/dL (ref 30.0–36.0)
MCV: 88 fL (ref 78.0–100.0)
Platelets: 235 10*3/uL (ref 150–400)
RBC: 5.1 MIL/uL (ref 3.87–5.11)
RDW: 13.2 % (ref 11.5–15.5)
WBC: 11.2 10*3/uL — AB (ref 4.0–10.5)

## 2014-03-08 LAB — URINALYSIS, ROUTINE W REFLEX MICROSCOPIC
Bilirubin Urine: NEGATIVE
Glucose, UA: NEGATIVE mg/dL
Hgb urine dipstick: NEGATIVE
Ketones, ur: NEGATIVE mg/dL
Nitrite: NEGATIVE
PH: 5 (ref 5.0–8.0)
Protein, ur: NEGATIVE mg/dL
SPECIFIC GRAVITY, URINE: 1.018 (ref 1.005–1.030)
Urobilinogen, UA: 0.2 mg/dL (ref 0.0–1.0)

## 2014-03-08 LAB — I-STAT CHEM 8, ED
BUN: 26 mg/dL — AB (ref 6–23)
CALCIUM ION: 1.16 mmol/L (ref 1.13–1.30)
CHLORIDE: 102 meq/L (ref 96–112)
CREATININE: 0.9 mg/dL (ref 0.50–1.10)
GLUCOSE: 138 mg/dL — AB (ref 70–99)
HCT: 50 % — ABNORMAL HIGH (ref 36.0–46.0)
Hemoglobin: 17 g/dL — ABNORMAL HIGH (ref 12.0–15.0)
Potassium: 4 mEq/L (ref 3.7–5.3)
Sodium: 141 mEq/L (ref 137–147)
TCO2: 26 mmol/L (ref 0–100)

## 2014-03-08 LAB — DIFFERENTIAL
Basophils Absolute: 0.1 10*3/uL (ref 0.0–0.1)
Basophils Relative: 0 % (ref 0–1)
EOS PCT: 2 % (ref 0–5)
Eosinophils Absolute: 0.3 10*3/uL (ref 0.0–0.7)
LYMPHS PCT: 34 % (ref 12–46)
Lymphs Abs: 3.8 10*3/uL (ref 0.7–4.0)
MONO ABS: 1.2 10*3/uL — AB (ref 0.1–1.0)
MONOS PCT: 10 % (ref 3–12)
Neutro Abs: 6 10*3/uL (ref 1.7–7.7)
Neutrophils Relative %: 54 % (ref 43–77)

## 2014-03-08 LAB — COMPREHENSIVE METABOLIC PANEL
ALT: 16 U/L (ref 0–35)
AST: 22 U/L (ref 0–37)
Albumin: 3.5 g/dL (ref 3.5–5.2)
Alkaline Phosphatase: 76 U/L (ref 39–117)
BUN: 25 mg/dL — ABNORMAL HIGH (ref 6–23)
CALCIUM: 9.1 mg/dL (ref 8.4–10.5)
CO2: 22 mEq/L (ref 19–32)
CREATININE: 0.88 mg/dL (ref 0.50–1.10)
Chloride: 100 mEq/L (ref 96–112)
GFR calc Af Amer: 74 mL/min — ABNORMAL LOW (ref >90)
GFR, EST NON AFRICAN AMERICAN: 64 mL/min — AB (ref >90)
Glucose, Bld: 137 mg/dL — ABNORMAL HIGH (ref 70–99)
Potassium: 4.3 mEq/L (ref 3.7–5.3)
Sodium: 139 mEq/L (ref 137–147)
Total Protein: 7.4 g/dL (ref 6.0–8.3)

## 2014-03-08 LAB — URINE MICROSCOPIC-ADD ON

## 2014-03-08 LAB — PROTIME-INR
INR: 0.93 (ref 0.00–1.49)
Prothrombin Time: 12.3 seconds (ref 11.6–15.2)

## 2014-03-08 LAB — ETHANOL: Alcohol, Ethyl (B): <11 mg/dL (ref 0–11)

## 2014-03-08 LAB — APTT: aPTT: 26 seconds (ref 24–37)

## 2014-03-08 NOTE — ED Provider Notes (Signed)
CSN: [id number]     Arrival date & time 03/08/14  2217 History   First MD Initiated Contact with Patient 03/08/14 2307     Chief Complaint  Patient presents with  . Code Stroke    An emergency department physician performed an initial assessment on this suspected stroke patient at 2218. (Consider location/radiation/quality/duration/timing/severity/associated sxs/prior Treatment) HPI 73 yo wf brought in via [facility] for sudden onset Lt side weakness & slurred speech. Per pt she sat down at 2000 to watch TV and noticed around 2130 that she had Lt side weakness & then slurred speech when she spoke with a friend. Sx resolved as the patient was being brought into the ED by paramedics. No headache.      Past Medical History  Diagnosis Date  . Hypertension   . Arthritis    History reviewed. No pertinent past surgical history. History reviewed. No pertinent family history. History  Substance Use Topics  . Smoking status: Former Smoker -- 2.00 packs/day for 18 years    Types: Cigarettes  . Smokeless tobacco: Never Used  . Alcohol Use: Yes   OB History   Grav Para Term Preterm Abortions TAB SAB Ect Mult Living                 Review of Systems Ten point review of symptoms performed and is negative with the exception of symptoms noted above.     Allergies  Caffeine  Home Medications   Current Outpatient Rx  Name  Route  Sig  Dispense  Refill  . aspirin 81 MG tablet   Oral   Take 324 mg by mouth once.         Marland Kitchen aspirin EC 81 MG tablet   Oral   Take 81 mg by mouth daily.         . Calcium Carbonate-Vit D-Min (CALCIUM 1200 PO)   Oral   Take 1 tablet by mouth daily.          . Clobetasol Prop Emollient Base 0.05 % emollient cream   Topical   Apply 1 application topically daily as needed. Apply to external vagina         . co-enzyme Q-10 30 MG capsule   Oral   Take 30 mg by mouth daily.         Marland Kitchen estradiol (ESTRACE) 0.1 MG/GM vaginal cream   Vaginal   Place 1  Applicatorful vaginally 3 (three) times a week.          Marland Kitchen Fexofenadine HCl (ALLER-EASE PO)   Oral   Take 1 tablet by mouth daily as needed (for seasonal allergies).         Marland Kitchen glucosamine-chondroitin 500-400 MG tablet   Oral   Take 1 tablet by mouth daily.          . metoprolol succinate (TOPROL-XL) 50 MG 24 hr tablet   Oral   Take 1 tablet (50 mg total) by mouth daily. Take with or immediately following a meal.   90 tablet   2   . omega-3 acid ethyl esters (LOVAZA) 1 G capsule   Oral   Take 1 g by mouth daily.         Marland Kitchen omeprazole (PRILOSEC) 20 MG capsule   Oral   Take 20 mg by mouth daily as needed.          . sertraline (ZOLOFT) 50 MG tablet   Oral   Take 25 mg by mouth daily.          Marland Kitchen  simethicone (MYLICON) 352 MG chewable tablet   Oral   Chew 125 mg by mouth every 6 (six) hours as needed for flatulence.         Marland Kitchen VITAMIN E PO   Oral   Take 1 capsule by mouth daily.          BP 164/72  Pulse 56  Temp(Src) 97.7 F (36.5 C) (Oral)  Resp 21  Ht _0  (1.702 m)  Wt 231 lb 7.7 oz (104.999 kg)  BMI 36.25 kg/m2  SpO2 98% Physical Exam Gen: well developed and well nourished appearing Head: NCAT Eyes: PERL, EOMI Nose: no epistaixis or rhinorrhea Mouth/throat: mucosa is moist and pink Neck: supple, no stridor Lungs: CTA B, no wheezing, rhonchi or rales CV: RRR, no murmur, extremities appear well perfused.  Abd: soft, notender, nondistended Back: no ttp, no cva ttp Skin: warm and dry Ext: normal to inspection, no dependent edema Neuro: CN ii-xii grossly intact, normal speech, normal finger to nose. no focal deficits Psyche; normal affect,  calm and cooperative.   ED Course  Procedures (including critical care time) Labs Review Labs Reviewed  CBC - Abnormal; Notable for the following:    WBC 11.2 (*)    Hemoglobin 15.6 (*)    All other components within normal limits  DIFFERENTIAL - Abnormal; Notable for the following:    Monocytes  Absolute 1.2 (*)    All other components within normal limits  COMPREHENSIVE METABOLIC PANEL - Abnormal; Notable for the following:    Glucose, Bld 137 (*)    BUN 25 (*)    Total Bilirubin <0.2 (*)    GFR calc non Af Amer 64 (*)    GFR calc Af Amer 74 (*)    All other components within normal limits  I-STAT CHEM 8, ED - Abnormal; Notable for the following:    BUN 26 (*)    Glucose, Bld 138 (*)    Hemoglobin 17.0 (*)    HCT 50.0 (*)    All other components within normal limits  ETHANOL  APTT  PROTIME-INR  URINE RAPID DRUG SCREEN (HOSP PERFORMED)  URINALYSIS, ROUTINE W REFLEX MICROSCOPIC  I-STAT TROPOININ, ED  I-STAT TROPOININ, ED   Imaging Review Ct Head Wo Contrast  03/08/2014   CLINICAL DATA:  Left-sided weakness.  EXAM: CT HEAD WITHOUT CONTRAST  TECHNIQUE: Contiguous axial images were obtained from the base of the skull through the vertex without intravenous contrast.  COMPARISON:  None.  FINDINGS: Ventricles, cisterns and other CSF spaces are normal. There is no mass, mass effect, shift of midline structures or acute hemorrhage. No findings to suggest acute infarction. Remaining bones soft tissues are within normal.  IMPRESSION: No acute intracranial findings.  These results were called by telephone at the time of interpretation on 03/08/2014 at 10:37 PM to Dr. Mingo Amber, who verbally acknowledged these results.   Electronically Signed   By: Marin Olp M.D.   On: 03/08/2014 22:38    EKG: nsr, no acute ischemic changes, normal intervals, normal axis, normal qrs complex  MDM   Code stroke called. Dr. Leonel Ramsay examined the patient and emergency department. Her head CT is unremarkable. Patient's symptoms have resolved. She'll be admitted to medicine service TIA workup.     Elyn Peers, MD 03/13/14 [phone]

## 2014-03-08 NOTE — Consult Note (Signed)
Neurology Consultation Reason for Consult: Left sided weakness Referring Physician: Mingo Amber, B  CC: Left sided weakness  History is obtained from:patient   HPI: [name] [name] is a 73 y.o. female with a history of left sided weakness that she noticed around 9:30pm. She stood up and walked in to the hallway, then coughed and noticed something sounded funny. She reached down to pick something up and had some clumsiness of her hand and could not grip well.   She had been watching TV and sitting down since 8pm, so she is not sure she would have noticed her symptoms in the interim.   She is currently completely back to baseline.   There was some questions of Afib in EMS transport, however strips were reviewed with clear P waves.    LKW: 8pm tpa given?: no, resolved symptoms.     ROS: A 14 point ROS was performed and is negative except as noted in the HPI.  PMH: Palpitations.  Hypertension.   Family History: multiple family members with TIA or stroke.   Social History: Tob: remote history of smoking.   Exam: Current vital signs: BP 160/63  Resp 18  Wt 104.999 kg (231 lb 7.7 oz)  SpO2 98% Vital signs in last 24 hours: Resp:  [18] 18 (04/07 2249) BP: (160)/(63) 160/63 mmHg (04/07 2249) SpO2:  [98 %] 98 % (04/07 2249) Weight:  [104.999 kg (231 lb 7.7 oz)] 104.999 kg (231 lb 7.7 oz) (04/07 2249)  General: in bed, NAD CV: RRR Mental Status: Patient is awake, alert, oriented to person, place, month, year, and situation. Immediate and remote memory are intact. Patient is able to give a clear and coherent history. No signs of aphasia or neglect Cranial Nerves: II: Visual Fields are full. Pupils are equal, round, and reactive to light.  Discs are difficult to visualize. III,IV, VI: EOMI without ptosis or diploplia.  V: Facial sensation is symmetric to temperature VII: Facial movement is symmetric.  VIII: hearing is intact to voice X: Uvula elevates symmetrically XI:  Shoulder shrug is symmetric. XII: tongue is midline without atrophy or fasciculations.  Motor: Tone is normal. Bulk is normal. 5/5 strength was present in all four extremities.  Sensory: Sensation is symmetric to light touch and temperature in the arms and legs. Deep Tendon Reflexes: 2+ and symmetric in the biceps and patellae.  Plantars: Toes are downgoing bilaterally.  Cerebellar: FNF and HKS are intact bilaterally Gait: Not assessed due to acute nature of evaluation and multiple medical monitors in ED setting.   I have reviewed labs in epic and the results pertinent to this consultation are: Mild leukocytosis.   I have reviewed the images obtained: CT head - negative.   Impression: 73 yo F with a history of hypertension and "palpitations" who presents with transient left sided weakness. Her history is consistent with TIA and she will need to be admitted for TIA workup.   Recommendations: 1. HgbA1c, fasting lipid panel 2. MRI, MRA  of the brain without contrast 3. Frequent neuro checks 4. Echocardiogram 5. Carotid dopplers 6. Prophylactic therapy-Antiplatelet med: Aspirin - dose 354m PO or 3056mPR 7. Risk factor modification 8. Telemetry monitoring 9. PT consult, OT consult, Speech consult    McRoland RackMD [facility] [phone] 7pm- 7am, please page neurology on call as listed in [place]

## 2014-03-08 NOTE — H&P (Signed)
[facility] History and Physical  Patient: [name] [name]  XLK:[id number]  DOB: 07-Jan-1941  DOS: the patient was seen and examined on 03/08/2014 PCP: Reginia Naas, MD  Chief Complaint: Left-sided weakness  HPI: [name] [name] is a 73 y.o. female with Past medical history of hypertension and arthritis. The patient presented with complaints of left-sided weakness. She mentions that she was at her baseline at around 9:30 and was lying down. When she felt a funny sensation on her face. She stood up and walk to the hallway where she had felt that there was some numbness on her left side of the face. This was followed by difficulty with picking of stuff and coordinating the left hand. She also started having difficulty walking around and therefore they called EMS. At the time of my evaluation the patient mentions she is at her baseline without any symptoms. Pt denies any fever, chills, headache, cough, chest pain, palpitation, shortness of breath, orthopnea, PND, nausea, vomiting, abdominal pain, diarrhea, constipation, active bleeding, burning urination, dizziness, pedal edema,  focal neurological deficit.  Patient mentions she is compliant with her medication and is taking her medications regularly. No recent change in her medications. She mentions she is going through a difficult time with a lot of stress in her life.  The patient is coming from home. and at her baseline Independent for most of her  ADL.  Review of Systems: as mentioned in the history of present illness.  A Comprehensive review of the other systems is negative.  Past Medical History  Diagnosis Date  . Hypertension   . Arthritis    History reviewed. No pertinent past surgical history. Social History:  reports that she has quit smoking. Her smoking use included Cigarettes. She has a 36 pack-year smoking history. She has never used smokeless tobacco. She reports that she drinks alcohol. She reports that she does  not use illicit drugs.  Allergies  Allergen Reactions  . Caffeine Palpitations    History reviewed. No pertinent family history.  Prior to Admission medications   Medication Sig Start Date End Date Taking? Authorizing Provider  aspirin 81 MG tablet Take 324 mg by mouth once.   Yes Historical Provider, MD  aspirin EC 81 MG tablet Take 81 mg by mouth daily.   Yes Historical Provider, MD  Calcium Carbonate-Vit D-Min (CALCIUM 1200 PO) Take 1 tablet by mouth daily.    Yes Historical Provider, MD  Clobetasol Prop Emollient Base 0.05 % emollient cream Apply 1 application topically daily as needed. Apply to external vagina 11/19/13  Yes Historical Provider, MD  co-enzyme Q-10 30 MG capsule Take 30 mg by mouth daily.   Yes Historical Provider, MD  estradiol (ESTRACE) 0.1 MG/GM vaginal cream Place 1 Applicatorful vaginally 3 (three) times a week.    Yes Historical Provider, MD  Fexofenadine HCl (ALLER-EASE PO) Take 1 tablet by mouth daily as needed (for seasonal allergies).   Yes Historical Provider, MD  glucosamine-chondroitin 500-400 MG tablet Take 1 tablet by mouth daily.    Yes Historical Provider, MD  metoprolol succinate (TOPROL-XL) 50 MG 24 hr tablet Take 1 tablet (50 mg total) by mouth daily. Take with or immediately following a meal. 02/01/14  Yes Erlene Quan, PA-C  omega-3 acid ethyl esters (LOVAZA) 1 G capsule Take 1 g by mouth daily.   Yes Historical Provider, MD  omeprazole (PRILOSEC) 20 MG capsule Take 20 mg by mouth daily as needed.    Yes Historical Provider, MD  sertraline (  ZOLOFT) 50 MG tablet Take 25 mg by mouth daily.    Yes Historical Provider, MD  simethicone (MYLICON) 784 MG chewable tablet Chew 125 mg by mouth every 6 (six) hours as needed for flatulence.   Yes Historical Provider, MD  VITAMIN E PO Take 1 capsule by mouth daily.   Yes Historical Provider, MD    Physical Exam: Filed Vitals:   03/08/14 2249 03/08/14 2257 03/08/14 2300  BP: 160/63  164/72  Pulse: 60  56   Temp: 97.7 F (36.5 C) 97.7 F (36.5 C)   TempSrc: Oral    Resp: 18  21  Height: _0  (1.702 m)    Weight: 104.999 kg (231 lb 7.7 oz)    SpO2: 98%  98%    General: Alert, Awake and Oriented to Time, Place and Person. Appear in mild distress Eyes: PERRL ENT: Oral Mucosa clear moist. Neck: no JVD Cardiovascular: S1 and S2 Present, no Murmur, Peripheral Pulses Present Respiratory: Bilateral Air entry equal and Decreased, Clear to Auscultation,  no Crackles,no wheezes Abdomen: Bowel Sound Present, Soft and Non tender Skin: no Rash Extremities: no Pedal edema, no calf tenderness Neurologic: Mental status AAOx3, speech normal, attention normal, Cranial Nerves PERRL, EOM normal and present, facial sensation to light touch present: , Motor strength bilateral equal strength 5/5, Sensation present to light touch, reflexes present knee and biceps, babinski negative, Proprioception normal,  Cerebellar test abnormal finger nose finger on the left as well as difficulty with repeated movements. Normal heel-to-shin bilaterally..   Labs on Admission:  CBC:  Recent Labs Lab 03/08/14 2233 03/08/14 2243  WBC 11.2*  --   NEUTROABS 6.0  --   HGB 15.6* 17.0*  HCT 44.9 50.0*  MCV 88.0  --   PLT 235  --     CMP     Component Value Date/Time   NA 141 03/08/2014 2243   K 4.0 03/08/2014 2243   CL 102 03/08/2014 2243   CO2 22 03/08/2014 2233   GLUCOSE 138* 03/08/2014 2243   BUN 26* 03/08/2014 2243   CREATININE 0.90 03/08/2014 2243   CALCIUM 9.1 03/08/2014 2233   PROT 7.4 03/08/2014 2233   ALBUMIN 3.5 03/08/2014 2233   AST 22 03/08/2014 2233   ALT 16 03/08/2014 2233   ALKPHOS 76 03/08/2014 2233   BILITOT <0.2* 03/08/2014 2233   GFRNONAA 64* 03/08/2014 2233   GFRAA 74* 03/08/2014 2233    No results found for this basename: LIPASE, AMYLASE,  in the last 168 hours No results found for this basename: AMMONIA,  in the last 168 hours  No results found for this basename: CKTOTAL, CKMB, CKMBINDEX, TROPONINI,  in the  last 168 hours BNP (last 3 results) No results found for this basename: PROBNP,  in the last 8760 hours  Radiological Exams on Admission: Ct Head Wo Contrast  03/08/2014   CLINICAL DATA:  Left-sided weakness.  EXAM: CT HEAD WITHOUT CONTRAST  TECHNIQUE: Contiguous axial images were obtained from the base of the skull through the vertex without intravenous contrast.  COMPARISON:  None.  FINDINGS: Ventricles, cisterns and other CSF spaces are normal. There is no mass, mass effect, shift of midline structures or acute hemorrhage. No findings to suggest acute infarction. Remaining bones soft tissues are within normal.  IMPRESSION: No acute intracranial findings.  These results were called by telephone at the time of interpretation on 03/08/2014 at 10:37 PM to Dr. Mingo Amber, who verbally acknowledged these results.   Electronically Signed   By:  Marin Olp M.D.   On: 03/08/2014 22:38    EKG: Independently reviewed. normal sinus rhythm, PAC's noted.  Assessment/Plan Principal Problem:   TIA (transient ischemic attack) Active Problems:   CARDIAC ARRHYTHMIA   HTN (hypertension)   1. TIA (transient ischemic attack) The patient is presenting with complaints of left-sided weakness left-sided numbness involving upper and lower extremity as well as her face. Her symptoms are completely resolved at time of my evaluation. Initial CT scan is negative as well as initial lab work does not show any acute abnormality. She has some difficulty with coordination and repeated movements. With this she will be admitted to the hospital for further workup including MRI and MRA brain, echocardiogram, carotid Doppler, serial neuro checks, telemetry monitoring, secondary prophylaxis. Continue with aspirin 325 Add Lipitor 40 Permissive hypertension  2. Hypertension Patient initially had elevated blood pressure but currently her blood pressure is acceptable therefore I would continue monitoring her blood pressure.  3.  Premature atrial contraction Patient has multiple irregular beats with premature atrial contraction. At present she does not have any prior significant history of CVA or A. Fib Continue to monitor  Consults: Neurology  DVT Prophylaxis: subcutaneous Heparin Nutrition: Cardiac diet  Code Status: Full  Family Communication: Daughter was present at bedside, opportunity was given to ask question and all questions were answered satisfactorily at the time of interview. Disposition: Admitted to observation in telemetrye unit.  Author: Berle Mull, MD [facility] Pager: [phone] 03/08/2014, 11:59 PM    If 7PM-7AM, please contact night-coverage [email or web address] Password [email or web address]

## 2014-03-08 NOTE — ED Notes (Signed)
Family at bedside. 

## 2014-03-08 NOTE — ED Notes (Signed)
Per EMS: pt coming from home with c/o left side weakness and slurred speech. Pt was watching TV during onset. Slurred speech has resolved. LSN 2000. Pt took four 81 mg asa. 20 g left AC. Pt is A&Ox4, respirations equal and unlabored, skin warm and dry.

## 2014-03-08 NOTE — ED Notes (Signed)
Pt returned from CT. Dr. Leonel Ramsay at bedside

## 2014-03-08 NOTE — Code Documentation (Signed)
73 yo wf brought in via [facility] for sudden onset Lt side weakness & slurred speech.  Per pt she sat down at 2000 to watch TV and noticed around 2130 that she had Lt side weakness & then slurred speech when she spoke with a friend.  By arrival to [facility] her symptoms had resolved.  Pt will be on TIA alert.  See doc flowsheets for code stroke times.  Will admit to medicine service.

## 2014-03-09 ENCOUNTER — Encounter (HOSPITAL_COMMUNITY): Payer: Self-pay | Admitting: *Deleted

## 2014-03-09 ENCOUNTER — Observation Stay (HOSPITAL_COMMUNITY): Payer: Medicare Other

## 2014-03-09 DIAGNOSIS — I10 Essential (primary) hypertension: Secondary | ICD-10-CM

## 2014-03-09 DIAGNOSIS — G459 Transient cerebral ischemic attack, unspecified: Secondary | ICD-10-CM | POA: Diagnosis not present

## 2014-03-09 DIAGNOSIS — R300 Dysuria: Secondary | ICD-10-CM | POA: Diagnosis not present

## 2014-03-09 DIAGNOSIS — I519 Heart disease, unspecified: Secondary | ICD-10-CM | POA: Diagnosis not present

## 2014-03-09 DIAGNOSIS — I658 Occlusion and stenosis of other precerebral arteries: Secondary | ICD-10-CM | POA: Diagnosis not present

## 2014-03-09 LAB — CBC WITH DIFFERENTIAL/PLATELET
Basophils Absolute: 0 10*3/uL (ref 0.0–0.1)
Basophils Relative: 0 % (ref 0–1)
Eosinophils Absolute: 0.2 10*3/uL (ref 0.0–0.7)
Eosinophils Relative: 2 % (ref 0–5)
HEMATOCRIT: 43.8 % (ref 36.0–46.0)
Hemoglobin: 14.8 g/dL (ref 12.0–15.0)
LYMPHS PCT: 33 % (ref 12–46)
Lymphs Abs: 3.6 10*3/uL (ref 0.7–4.0)
MCH: 29.8 pg (ref 26.0–34.0)
MCHC: 33.8 g/dL (ref 30.0–36.0)
MCV: 88.1 fL (ref 78.0–100.0)
MONO ABS: 1.1 10*3/uL — AB (ref 0.1–1.0)
MONOS PCT: 10 % (ref 3–12)
NEUTROS ABS: 6 10*3/uL (ref 1.7–7.7)
Neutrophils Relative %: 55 % (ref 43–77)
Platelets: 240 10*3/uL (ref 150–400)
RBC: 4.97 MIL/uL (ref 3.87–5.11)
RDW: 13.4 % (ref 11.5–15.5)
WBC: 11 10*3/uL — AB (ref 4.0–10.5)

## 2014-03-09 LAB — COMPREHENSIVE METABOLIC PANEL
ALBUMIN: 3.4 g/dL — AB (ref 3.5–5.2)
ALK PHOS: 62 U/L (ref 39–117)
ALT: 14 U/L (ref 0–35)
AST: 22 U/L (ref 0–37)
BUN: 22 mg/dL (ref 6–23)
CHLORIDE: 104 meq/L (ref 96–112)
CO2: 27 meq/L (ref 19–32)
Calcium: 9.1 mg/dL (ref 8.4–10.5)
Creatinine, Ser: 0.87 mg/dL (ref 0.50–1.10)
GFR calc Af Amer: 75 mL/min — ABNORMAL LOW (ref >90)
GFR calc non Af Amer: 65 mL/min — ABNORMAL LOW (ref >90)
Glucose, Bld: 98 mg/dL (ref 70–99)
Potassium: 5.2 mEq/L (ref 3.7–5.3)
Sodium: 144 mEq/L (ref 137–147)
TOTAL PROTEIN: 6.9 g/dL (ref 6.0–8.3)
Total Bilirubin: 0.3 mg/dL (ref 0.3–1.2)

## 2014-03-09 LAB — LIPID PANEL
CHOL/HDL RATIO: 2.6 ratio
CHOLESTEROL: 152 mg/dL (ref 0–200)
HDL: 58 mg/dL (ref >39)
LDL Cholesterol: 77 mg/dL (ref 0–99)
TRIGLYCERIDES: 87 mg/dL (ref <150)
VLDL: 17 mg/dL (ref 0–40)

## 2014-03-09 LAB — RAPID URINE DRUG SCREEN, HOSP PERFORMED
Amphetamines: NOT DETECTED
BENZODIAZEPINES: NOT DETECTED
Barbiturates: NOT DETECTED
Cocaine: NOT DETECTED
Opiates: NOT DETECTED
Tetrahydrocannabinol: NOT DETECTED

## 2014-03-09 LAB — TROPONIN I: Troponin I: <0.3 ng/mL (ref <0.30)

## 2014-03-09 LAB — GLUCOSE, CAPILLARY
GLUCOSE-CAPILLARY: 103 mg/dL — AB (ref 70–99)
Glucose-Capillary: 112 mg/dL — ABNORMAL HIGH (ref 70–99)
Glucose-Capillary: 103 mg/dL — ABNORMAL HIGH (ref 70–99)

## 2014-03-09 LAB — HEMOGLOBIN A1C
Hgb A1c MFr Bld: 5.9 % — ABNORMAL HIGH (ref <5.7)
MEAN PLASMA GLUCOSE: 123 mg/dL — AB (ref <117)

## 2014-03-09 MED ORDER — ASPIRIN EC 325 MG PO TBEC
325.0000 mg | DELAYED_RELEASE_TABLET | Freq: Every day | ORAL | Status: DC
  Administered 2014-03-09: 325 mg via ORAL
  Filled 2014-03-09: qty 1

## 2014-03-09 MED ORDER — STUDY - INVESTIGATIONAL DRUG SIMPLE RECORD
100.0000 mg | Freq: Every day | Status: DC
  Filled 2014-03-09: qty 100

## 2014-03-09 MED ORDER — STUDY - INVESTIGATIONAL DRUG SIMPLE RECORD
180.0000 mg | Freq: Once | Status: AC
  Administered 2014-03-09: 180 mg via ORAL
  Filled 2014-03-09: qty 180

## 2014-03-09 MED ORDER — ENOXAPARIN SODIUM 40 MG/0.4ML ~~LOC~~ SOLN
40.0000 mg | SUBCUTANEOUS | Status: DC
  Administered 2014-03-09: 40 mg via SUBCUTANEOUS
  Filled 2014-03-09: qty 0.4

## 2014-03-09 MED ORDER — CIPROFLOXACIN HCL 500 MG PO TABS
500.0000 mg | ORAL_TABLET | Freq: Two times a day (BID) | ORAL | Status: DC
Start: 2014-03-09 — End: 2014-03-09
  Filled 2014-03-09 (×2): qty 1

## 2014-03-09 MED ORDER — STUDY - INVESTIGATIONAL DRUG SIMPLE RECORD
90.0000 mg | Freq: Two times a day (BID) | Status: DC
  Filled 2014-03-09: qty 90

## 2014-03-09 MED ORDER — CIPROFLOXACIN HCL 500 MG PO TABS: 500.0000 mg | ORAL_TABLET | Freq: Two times a day (BID) | ORAL | Status: DC

## 2014-03-09 MED ORDER — STUDY - INVESTIGATIONAL DRUG SIMPLE RECORD
300.0000 mg | Freq: Once | Status: AC
  Administered 2014-03-09: 300 mg via ORAL
  Filled 2014-03-09: qty 300

## 2014-03-09 NOTE — Evaluation (Signed)
Physical Therapy Evaluation Patient Details Name: [name] [name] MRN: [id number] DOB: 12-Aug-1941 Today's Date: 03/09/2014   History of Present Illness  HPI: [name] [name] is a 73 y.o. female with Past medical history of hypertension and arthritis. Patient with resolved left sided weakness and paresthesias. Work up for TIA.  Clinical Impression  Patient at baseline mobility at this time. Educated patient and family regarding BEFAST stroke signs. Patient and family appreciative. No further acute PT needs, will sign off.    Follow Up Recommendations No PT follow up    Equipment Recommendations  None recommended by PT    Recommendations for Other Services       Precautions / Restrictions        Mobility  Bed Mobility Overal bed mobility: Modified Independent             General bed mobility comments: increased time to perform, educated to sit EOB for several seconds as patient did experience some dizziness initially but this resolved quickly   Transfers Overall transfer level: Independent Equipment used: None                Ambulation/Gait Ambulation/Gait assistance: Independent Ambulation Distance (Feet): 380 Feet Assistive device: None Gait Pattern/deviations: WFL(Within Functional Limits) Gait velocity: decreased   General Gait Details: VCs for increased cadence, some baseline instability noted, educated patient and family on balance improvement strategies  Stairs            Wheelchair Mobility    Modified Rankin (Stroke Patients Only) Modified Rankin (Stroke Patients Only) Pre-Morbid Rankin Score: No symptoms Modified Rankin: No significant disability     Balance                                 Standardized Balance Assessment Standardized Balance Assessment : Dynamic Gait Index   Dynamic Gait Index Level Surface: Normal Change in Gait Speed: Mild Impairment Gait with Horizontal Head Turns: Mild Impairment Gait with  Vertical Head Turns: Mild Impairment Gait and Pivot Turn: Normal Step Over Obstacle: Normal Step Around Obstacles: Normal Steps: Mild Impairment Total Score: 20       Pertinent Vitals/Pain 4/10 headache reported at this time    Home Living Family/patient expects to be discharged to:: Private residence Living Arrangements: Alone Available Help at Discharge: Family Type of Home: House Home Access: Stairs to enter Entrance Stairs-Rails: Right Entrance Stairs-Number of Steps: 2 Home Layout: One level Home Equipment: None      Prior Function Level of Independence: Independent               Hand Dominance   Dominant Hand: Right    Extremity/Trunk Assessment   Upper Extremity Assessment: Overall WFL for tasks assessed           Lower Extremity Assessment: Overall WFL for tasks assessed (+ arthritic changes)         Communication   Communication: No difficulties  Cognition Arousal/Alertness: Awake/alert Behavior During Therapy: WFL for tasks assessed/performed Overall Cognitive Status: Within Functional Limits for tasks assessed                      General Comments General comments (skin integrity, edema, etc.): educated family at length regarding BE FAST signs and symptoms of stroke, educated patient regarding balance imporvement strategies.     Exercises        Assessment/Plan    PT Assessment Patent does  not need any further PT services  PT Diagnosis     PT Problem List    PT Treatment Interventions     PT Goals (Current goals can be found in the Care Plan section) Acute Rehab PT Goals Patient Stated Goal: to go home PT Goal Formulation: No goals set, d/c therapy Time For Goal Achievement: 03/23/14    Frequency     Barriers to discharge        Co-evaluation               End of Session Equipment Utilized During Treatment: Gait belt Activity Tolerance: Patient tolerated treatment well Patient left: in bed;with  family/visitor present (seated EOB with MD in room) Nurse Communication: Mobility status         Time: 4268-3419 PT Time Calculation (min): 25 min   Charges:   PT Evaluation $Initial PT Evaluation Tier I: 1 Procedure PT Treatments $Gait Training: 8-22 mins $Self Care/Home Management: 8-22   PT G Codes:          Duncan Dull 03/09/2014, 3:18 PM Alben Deeds, [place] DPT  [phone]

## 2014-03-09 NOTE — Progress Notes (Signed)
OT Cancellation Note  Patient Details Name: [name] [name] MRN: [id number] DOB: 01/21/1941   Cancelled Treatment:    Reason Eval/Treat Not Completed: OT screened, no needs identified, will sign off.   Benito Mccreedy OTR/L [phone] 03/09/2014, 3:43 PM

## 2014-03-09 NOTE — Progress Notes (Signed)
VASCULAR LAB PRELIMINARY  PRELIMINARY  PRELIMINARY  PRELIMINARY  Carotid duplex completed.    Preliminary report:  Bilateral:  1-39% ICA stenosis.  Vertebral artery flow is antegrade.      Nani Ravens, RVT 03/09/2014, 9:36 AM

## 2014-03-09 NOTE — Progress Notes (Signed)
Patient is discharged from room 4N24 at this time. Alert and in stable condition. IV site d/c'd as well as tele. Instructions read to patient and understanding verbalized. Meds given to take home per order. Patient left unit via wheelchair with belongings at side.

## 2014-03-09 NOTE — Progress Notes (Signed)
OT Cancellation Note  Patient Details Name: PAYTON MODER MRN: [id number] DOB: 24-Oct-1941   Cancelled Treatment:    Reason Eval/Treat Not Completed: Patient at procedure or test/ unavailable-MRI. Benito Mccreedy OTR/L [phone] 03/09/2014, 8:50 AM

## 2014-03-09 NOTE — Discharge Summary (Signed)
Physician Discharge Summary  [name] [name] YNW:[id number] DOB: 1941/02/28 DOA: 03/08/2014  PCP: Reginia Naas, MD  Admit date: 03/08/2014 Discharge date: 03/09/2014  Time spent: 65 minutes  Recommendations for Outpatient Follow-up:  1. Follow up with Dr. Leonie Man of neurology in 1 month. Patient has been enrolled in the Socrates trial. 2. Follow up with Reginia Naas, MD in 1-2 weeks.  Discharge Diagnoses:  Principal Problem:   TIA (transient ischemic attack) Active Problems:   CARDIAC ARRHYTHMIA   HTN (hypertension)   Dysuria   Discharge Condition: stable and improved  Diet recommendation: heart healthy  Filed Weights   03/08/14 2249  Weight: 104.999 kg (231 lb 7.7 oz)    History of present illness:  [name] [name] is a 73 y.o. female with Past medical history of hypertension and arthritis.  The patient presented with complaints of left-sided weakness. She mentions that she was at her baseline at around 9:30 and was lying down. When she felt a funny sensation on her face. She stood up and walk to the hallway where she had felt that there was some numbness on her left side of the face. This was followed by difficulty with picking of stuff and coordinating the left hand. She also started having difficulty walking around and therefore they called EMS. At the time of my evaluation the patient mentions she is at her baseline without any symptoms.  Pt denies any fever, chills, headache, cough, chest pain, palpitation, shortness of breath, orthopnea, PND, nausea, vomiting, abdominal pain, diarrhea, constipation, active bleeding, burning urination, dizziness, pedal edema, focal neurological deficit.  Patient mentions she is compliant with her medication and is taking her medications regularly. No recent change in her medications.  She mentions she is going through a difficult time with a lot of stress in her life.  The patient is coming from home. and at her baseline  Independent for most of her ADL.   Hospital Course:  #1. TIA Patient had presented with left-sided weakness and numbness which improved rapidly and a such was deemed not a TPA candidate. Patient was admitted for stroke workup. CT of the head which was done was unremarkable. MRI of the head which was done was negative for any acute abnormalities. Preliminary carotid Dopplers with no significant ICA stenosis. 2-D echo was unremarkable. LDL which was done came back at 77.hemoglobin A1c which was obtained came back at 5.9. Patient was initially placed on a full dose aspirin and neurology consultation was obtained. Patient was seen in consultation by Dr. Leonel Ramsay I was in agreement for stroke workup. Patient improved clinically and was back to baseline by day of discharge. Patient was enrolled in the Socrates trial and will followup with Dr. Leonie Man of neurology in 1 month. Patient discharged in stable and improved condition.  #2 hypertension Stable.  #3 dysuria Patient did state that has multiple UTIs.urinalysis which was done was nitrite negative had small leukocytes and 3-6 WBCs. Urine cultures was never obtained. Patient be discharged home on 3 days of oral ciprofloxacin to complete a course of antibiotic therapy.  #4 premature atrial contraction Stable.  Procedures:  MRI/MRA  03/09/14  CT head 03/08/14  Carotid Dopplers 03/09/2014  2-D echo 03/09/2014  Consultations:  Neurology: Dr Leonel Ramsay 03/08/14  Discharge Exam: Filed Vitals:   03/09/14 1420  BP: 114/69  Pulse: 81  Temp: 97.5 F (36.4 C)  Resp: 18    General: NAD Cardiovascular: RRR Respiratory: CTAB  Discharge Instructions You were cared for by a hospitalist  during your hospital stay. If you have any questions about your discharge medications or the care you received while you were in the hospital after you are discharged, you can call the unit and asked to speak with the hospitalist on call if the hospitalist that  took care of you is not available. Once you are discharged, your primary care physician will handle any further medical issues. Please note that NO REFILLS for any discharge medications will be authorized once you are discharged, as it is imperative that you return to your primary care physician (or establish a relationship with a primary care physician if you do not have one) for your aftercare needs so that they can reassess your need for medications and monitor your lab values.      Discharge Orders   Future Orders Complete By Expires   Diet - low sodium heart healthy  As directed    Discharge instructions  As directed    Increase activity slowly  As directed        Medication List    STOP taking these medications       aspirin 81 MG tablet     aspirin EC 81 MG tablet      TAKE these medications       ALLER-EASE PO  Take 1 tablet by mouth daily as needed (for seasonal allergies).     CALCIUM 1200 PO  Take 1 tablet by mouth daily.     ciprofloxacin 500 MG tablet  Commonly known as:  CIPRO  Take 1 tablet (500 mg total) by mouth 2 (two) times daily. Take for 3 days then stop.     Clobetasol Prop Emollient Base 0.05 % emollient cream  Apply 1 application topically daily as needed. Apply to external vagina     co-enzyme Q-10 30 MG capsule  Take 30 mg by mouth daily.     estradiol 0.1 MG/GM vaginal cream  Commonly known as:  ESTRACE  Place 1 Applicatorful vaginally 3 (three) times a week.     glucosamine-chondroitin 500-400 MG tablet  Take 1 tablet by mouth daily.     metoprolol succinate 50 MG 24 hr tablet  Commonly known as:  TOPROL-XL  Take 1 tablet (50 mg total) by mouth daily. Take with or immediately following a meal.     omega-3 acid ethyl esters 1 G capsule  Commonly known as:  LOVAZA  Take 1 g by mouth daily.     omeprazole 20 MG capsule  Commonly known as:  PRILOSEC  Take 20 mg by mouth daily as needed.     sertraline 50 MG tablet  Commonly known as:   ZOLOFT  Take 25 mg by mouth daily.     simethicone 125 MG chewable tablet  Commonly known as:  MYLICON  Chew 416 mg by mouth every 6 (six) hours as needed for flatulence.     VITAMIN E PO  Take 1 capsule by mouth daily.       Allergies  Allergen Reactions  . Caffeine Palpitations   Follow-up Information   Follow up with Reginia Naas, MD. Schedule an appointment as soon as possible for a visit in 1 week.   Specialty:  Family Medicine   Contact information:   [address], [place] [address] [phone]       Follow up with Forbes Cellar, MD In 1 month.   Specialties:  Neurology, Radiology   Contact information:   [place]  [place] [address] [phone]        The results of significant diagnostics from this hospitalization (including imaging, microbiology, ancillary and laboratory) are listed below for reference.    Significant Diagnostic Studies: Ct Head Wo Contrast  03/08/2014   CLINICAL DATA:  Left-sided weakness.  EXAM: CT HEAD WITHOUT CONTRAST  TECHNIQUE: Contiguous axial images were obtained from the base of the skull through the vertex without intravenous contrast.  COMPARISON:  None.  FINDINGS: Ventricles, cisterns and other CSF spaces are normal. There is no mass, mass effect, shift of midline structures or acute hemorrhage. No findings to suggest acute infarction. Remaining bones soft tissues are within normal.  IMPRESSION: No acute intracranial findings.  These results were called by telephone at the time of interpretation on 03/08/2014 at 10:37 PM to Dr. Mingo Amber, who verbally acknowledged these results.   Electronically Signed   By: Marin Olp M.D.   On: 03/08/2014 22:38   Mri Brain Without Contrast  03/09/2014   CLINICAL DATA:  TIA.  Left-sided weakness.  EXAM: MRI HEAD WITHOUT CONTRAST  MRA HEAD WITHOUT CONTRAST  TECHNIQUE: Multiplanar, multiecho pulse sequences of the brain and surrounding structures were  obtained without intravenous contrast. Angiographic images of the head were obtained using MRA technique without contrast.  COMPARISON:  CT 03/08/2014  FINDINGS: MRI HEAD FINDINGS  Ventricle size is normal. Craniocervical junction is normal. Pituitary normal in size.  Age-appropriate atrophy. Negative for acute infarct. No significant chronic ischemia. Cerebral white matter is normal. Basal ganglia and brainstem are normal.  Negative for hemorrhage or mass lesion.  Paranasal sinuses are clear.  MRA HEAD FINDINGS  Both vertebral arteries are patent to the basilar. PICA is patent bilaterally. Left vertebral artery is dominant. The basilar is widely patent. Mild disease in the posterior cerebral artery bilaterally without large vessel occlusion.  The internal carotid artery is widely patent bilaterally. There is an artifact of processing through the left A1 segment however this vessel shows no significant stenosis on source images. Anterior and middle cerebral arteries are patent bilaterally without stenosis  Negative for cerebral aneurysm.  IMPRESSION: Normal MRI of the head for age.  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 Otherwise negative MRA of the head.   Electronically Signed   By: Franchot Gallo M.D.   On: 03/09/2014 10:25   Mr Jodene Nam Head/brain Wo Cm  03/09/2014   CLINICAL DATA:  TIA.  Left-sided weakness.  EXAM: MRI HEAD WITHOUT CONTRAST  MRA HEAD WITHOUT CONTRAST  TECHNIQUE: Multiplanar, multiecho pulse sequences of the brain and surrounding structures were obtained without intravenous contrast. Angiographic images of the head were obtained using MRA technique without contrast.  COMPARISON:  CT 03/08/2014  FINDINGS: MRI HEAD FINDINGS  Ventricle size is normal. Craniocervical junction is normal. Pituitary normal in size.  Age-appropriate atrophy. Negative for acute infarct. No significant chronic ischemia. Cerebral white matter is normal. Basal ganglia and brainstem are normal.  Negative  for hemorrhage or mass lesion.  Paranasal sinuses are clear.  MRA HEAD FINDINGS  Both vertebral arteries are patent to the basilar. PICA is patent bilaterally. Left vertebral artery is dominant. The basilar is widely patent. Mild disease in the posterior cerebral artery bilaterally without large vessel occlusion.  The internal carotid artery is widely patent bilaterally. There is an artifact of processing through the left A1 segment however this vessel shows no significant stenosis on source images. Anterior and middle cerebral arteries are patent bilaterally without stenosis  Negative for cerebral aneurysm.  IMPRESSION: Normal MRI of the head for age.  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 Otherwise negative MRA of the head.   Electronically Signed   By: Franchot Gallo M.D.   On: 03/09/2014 10:25    Microbiology: No results found for this or any previous visit (from the past 240 hour(s)).   Labs: Basic Metabolic Panel:  Recent Labs Lab 03/08/14 2233 03/08/14 2243 03/09/14 0440  NA 139 141 144  K 4.3 4.0 5.2  CL 100 102 104  CO2 22  --  27  GLUCOSE 137* 138* 98  BUN 25* 26* 22  CREATININE 0.88 0.90 0.87  CALCIUM 9.1  --  9.1   Liver Function Tests:  Recent Labs Lab 03/08/14 2233 03/09/14 0440  AST 22 22  ALT 16 14  ALKPHOS 76 62  BILITOT <0.2* 0.3  PROT 7.4 6.9  ALBUMIN 3.5 3.4*   No results found for this basename: LIPASE, AMYLASE,  in the last 168 hours No results found for this basename: AMMONIA,  in the last 168 hours CBC:  Recent Labs Lab 03/08/14 2233 03/08/14 2243 03/09/14 0440  WBC 11.2*  --  11.0*  NEUTROABS 6.0  --  6.0  HGB 15.6* 17.0* 14.8  HCT 44.9 50.0* 43.8  MCV 88.0  --  88.1  PLT 235  --  240   Cardiac Enzymes:  Recent Labs Lab 03/09/14 0440  TROPONINI <0.30   BNP: BNP (last 3 results) No results found for this basename: PROBNP,  in the last 8760 hours CBG:  Recent Labs Lab 03/09/14 0630 03/09/14 1142  GLUCAP  112* 103*       Signed:  Eugenie Filler MD  [facility] 03/09/2014, 3:34 PM

## 2014-03-09 NOTE — Progress Notes (Signed)
Subjective: Patient has had no further focal events.  Reports that she is back to baseline.    Objective: Current vital signs: BP 147/62  Pulse 54  Temp(Src) 98.5 F (36.9 C) (Oral)  Resp 18  Ht _0  (1.702 m)  Wt 104.999 kg (231 lb 7.7 oz)  BMI 36.25 kg/m2  SpO2 96% Vital signs in last 24 hours: Temp:  [97.7 F (36.5 C)-98.5 F (36.9 C)] 98.5 F (36.9 C) (04/08 0610) Pulse Rate:  [48-60] 54 (04/08 0610) Resp:  [18-21] 18 (04/08 0610) BP: (146-164)/(61-72) 147/62 mmHg (04/08 0610) SpO2:  [95 %-98 %] 96 % (04/08 0610) Weight:  [104.999 kg (231 lb 7.7 oz)] 104.999 kg (231 lb 7.7 oz) (04/07 2249)  Intake/Output from previous day:   Intake/Output this shift:   Nutritional status: Cardiac  Neurologic Exam: Mental Status:  Awake and alert.  Follows three step commands.  Speech fluent.   Cranial Nerves:  II: Visual Fields are full. Pupils are equal, round, and reactive to light. Discs are difficult to visualize.  III,IV, VI: EOMI without ptosis or diploplia.  V: Facial sensation is symmetric to temperature  VII: Facial movement is symmetric.  VIII: hearing is intact to voice  X: Uvula elevates symmetrically  XI: Shoulder shrug is symmetric.  XII: tongue is midline without atrophy or fasciculations.  Motor:  Tone is normal. Bulk is normal. 5/5 strength was present in all four extremities.  Sensory:  Sensation is symmetric to light touch and temperature in the arms and legs.  Deep Tendon Reflexes:  2+ throughout Plantars:  Toes are downgoing bilaterally.  Cerebellar:  FNF and HKS are intact bilaterally    Lab Results: Basic Metabolic Panel:  Recent Labs Lab 03/08/14 2233 03/08/14 2243 03/09/14 0440  NA 139 141 144  K 4.3 4.0 5.2  CL 100 102 104  CO2 22  --  27  GLUCOSE 137* 138* 98  BUN 25* 26* 22  CREATININE 0.88 0.90 0.87  CALCIUM 9.1  --  9.1    Liver Function Tests:  Recent Labs Lab 03/08/14 2233 03/09/14 0440  AST 22 22  ALT 16 14  ALKPHOS  76 62  BILITOT <0.2* 0.3  PROT 7.4 6.9  ALBUMIN 3.5 3.4*   No results found for this basename: LIPASE, AMYLASE,  in the last 168 hours No results found for this basename: AMMONIA,  in the last 168 hours  CBC:  Recent Labs Lab 03/08/14 2233 03/08/14 2243 03/09/14 0440  WBC 11.2*  --  11.0*  NEUTROABS 6.0  --  6.0  HGB 15.6* 17.0* 14.8  HCT 44.9 50.0* 43.8  MCV 88.0  --  88.1  PLT 235  --  240    Cardiac Enzymes:  Recent Labs Lab 03/09/14 0440  TROPONINI <0.30    Lipid Panel:  Recent Labs Lab 03/09/14 0440  CHOL 152  TRIG 87  HDL 58  CHOLHDL 2.6  VLDL 17  LDLCALC 77    CBG:  Recent Labs Lab 03/09/14 0630 03/09/14 1142  GLUCAP 112* 103*    Microbiology: No results found for this or any previous visit.  Coagulation Studies:  Recent Labs  03/08/14 2233  LABPROT 12.3  INR 0.93    Imaging: Ct Head Wo Contrast  03/08/2014   CLINICAL DATA:  Left-sided weakness.  EXAM: CT HEAD WITHOUT CONTRAST  TECHNIQUE: Contiguous axial images were obtained from the base of the skull through the vertex without intravenous contrast.  COMPARISON:  None.  FINDINGS: Ventricles, cisterns  and other CSF spaces are normal. There is no mass, mass effect, shift of midline structures or acute hemorrhage. No findings to suggest acute infarction. Remaining bones soft tissues are within normal.  IMPRESSION: No acute intracranial findings.  These results were called by telephone at the time of interpretation on 03/08/2014 at 10:37 PM to Dr. Mingo Amber, who verbally acknowledged these results.   Electronically Signed   By: Marin Olp M.D.   On: 03/08/2014 22:38   Mri Brain Without Contrast  03/09/2014   CLINICAL DATA:  TIA.  Left-sided weakness.  EXAM: MRI HEAD WITHOUT CONTRAST  MRA HEAD WITHOUT CONTRAST  TECHNIQUE: Multiplanar, multiecho pulse sequences of the brain and surrounding structures were obtained without intravenous contrast. Angiographic images of the head were obtained using MRA  technique without contrast.  COMPARISON:  CT 03/08/2014  FINDINGS: MRI HEAD FINDINGS  Ventricle size is normal. Craniocervical junction is normal. Pituitary normal in size.  Age-appropriate atrophy. Negative for acute infarct. No significant chronic ischemia. Cerebral white matter is normal. Basal ganglia and brainstem are normal.  Negative for hemorrhage or mass lesion.  Paranasal sinuses are clear.  MRA HEAD FINDINGS  Both vertebral arteries are patent to the basilar. PICA is patent bilaterally. Left vertebral artery is dominant. The basilar is widely patent. Mild disease in the posterior cerebral artery bilaterally without large vessel occlusion.  The internal carotid artery is widely patent bilaterally. There is an artifact of processing through the left A1 segment however this vessel shows no significant stenosis on source images. Anterior and middle cerebral arteries are patent bilaterally without stenosis  Negative for cerebral aneurysm.  IMPRESSION: Normal MRI of the head for age.  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 Otherwise negative MRA of the head.   Electronically Signed   By: Franchot Gallo M.D.   On: 03/09/2014 10:25   Mr Jodene Nam Head/brain Wo Cm  03/09/2014   CLINICAL DATA:  TIA.  Left-sided weakness.  EXAM: MRI HEAD WITHOUT CONTRAST  MRA HEAD WITHOUT CONTRAST  TECHNIQUE: Multiplanar, multiecho pulse sequences of the brain and surrounding structures were obtained without intravenous contrast. Angiographic images of the head were obtained using MRA technique without contrast.  COMPARISON:  CT 03/08/2014  FINDINGS: MRI HEAD FINDINGS  Ventricle size is normal. Craniocervical junction is normal. Pituitary normal in size.  Age-appropriate atrophy. Negative for acute infarct. No significant chronic ischemia. Cerebral white matter is normal. Basal ganglia and brainstem are normal.  Negative for hemorrhage or mass lesion.  Paranasal sinuses are clear.  MRA HEAD FINDINGS  Both vertebral  arteries are patent to the basilar. PICA is patent bilaterally. Left vertebral artery is dominant. The basilar is widely patent. Mild disease in the posterior cerebral artery bilaterally without large vessel occlusion.  The internal carotid artery is widely patent bilaterally. There is an artifact of processing through the left A1 segment however this vessel shows no significant stenosis on source images. Anterior and middle cerebral arteries are patent bilaterally without stenosis  Negative for cerebral aneurysm.  IMPRESSION: Normal MRI of the head for age.  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 Otherwise negative MRA of the head.   Electronically Signed   By: Franchot Gallo M.D.   On: 03/09/2014 10:25    Medications:  I have reviewed the patient's current medications. Scheduled: . aspirin EC  325 mg Oral Daily  . enoxaparin (LOVENOX) injection  40 mg Subcutaneous Q24H  . research study medication  300 mg Oral Once  Followed by  . [START ON 03/10/2014] research study medication  100 mg Oral Daily  . research study medication  180 mg Oral Once   Followed by  . research study medication  90 mg Oral BID    Assessment/Plan: 73 year old female admitted after an episode of left sided weakness felt to represent a TIA.  Patient now back to baseline.  MRI of the brain reviewed and shows no acute changes.  Carotid dopplers show no significant stenosis.  Echocardiogram is unremarkable.  LDL is 77.  A1c is unremarkable.    Recommendations: 1.  Patient has been enrolled in a trial.  Stroke work up complete.  Agree with discharge and follow up with Dr. Leonie Man as an outpatient.      LOS: 1 day   Alexis Goodell, MD [facility] [phone] 03/09/2014  3:09 PM

## 2014-03-09 NOTE — Progress Notes (Signed)
PT Cancellation Note  Patient Details Name: [name] [name] MRN: [id number] DOB: 04/26/1941   Cancelled Treatment:    Reason Eval/Treat Not Completed: Patient at procedure or test/unavailable (MRI)   Duncan Dull 03/09/2014, 8:30 AM

## 2014-03-09 NOTE — Progress Notes (Signed)
UR completed.

## 2014-03-15 ENCOUNTER — Telehealth: Payer: Self-pay | Admitting: Neurology

## 2014-03-15 DIAGNOSIS — R42 Dizziness and giddiness: Secondary | ICD-10-CM | POA: Diagnosis not present

## 2014-03-15 NOTE — Telephone Encounter (Signed)
Gave message to Dr Sethi-03/15/14

## 2014-03-17 DIAGNOSIS — R300 Dysuria: Secondary | ICD-10-CM | POA: Diagnosis not present

## 2014-03-17 DIAGNOSIS — R42 Dizziness and giddiness: Secondary | ICD-10-CM | POA: Diagnosis not present

## 2014-03-18 ENCOUNTER — Encounter (INDEPENDENT_AMBULATORY_CARE_PROVIDER_SITE_OTHER): Payer: Medicare Other

## 2014-03-18 DIAGNOSIS — Z0289 Encounter for other administrative examinations: Secondary | ICD-10-CM

## 2014-03-18 DIAGNOSIS — F4323 Adjustment disorder with mixed anxiety and depressed mood: Secondary | ICD-10-CM | POA: Diagnosis not present

## 2014-03-23 DIAGNOSIS — F4323 Adjustment disorder with mixed anxiety and depressed mood: Secondary | ICD-10-CM | POA: Diagnosis not present

## 2014-03-25 DIAGNOSIS — R339 Retention of urine, unspecified: Secondary | ICD-10-CM | POA: Diagnosis not present

## 2014-03-25 DIAGNOSIS — M899 Disorder of bone, unspecified: Secondary | ICD-10-CM | POA: Diagnosis not present

## 2014-03-25 DIAGNOSIS — M949 Disorder of cartilage, unspecified: Secondary | ICD-10-CM | POA: Diagnosis not present

## 2014-03-25 DIAGNOSIS — R109 Unspecified abdominal pain: Secondary | ICD-10-CM | POA: Diagnosis not present

## 2014-04-01 DIAGNOSIS — F4323 Adjustment disorder with mixed anxiety and depressed mood: Secondary | ICD-10-CM | POA: Diagnosis not present

## 2014-04-06 DIAGNOSIS — F4323 Adjustment disorder with mixed anxiety and depressed mood: Secondary | ICD-10-CM | POA: Diagnosis not present

## 2014-04-07 ENCOUNTER — Encounter: Payer: Self-pay | Admitting: *Deleted

## 2014-04-07 DIAGNOSIS — G459 Transient cerebral ischemic attack, unspecified: Secondary | ICD-10-CM | POA: Diagnosis not present

## 2014-04-11 ENCOUNTER — Telehealth: Payer: Self-pay | Admitting: Cardiovascular Disease

## 2014-04-11 NOTE — Telephone Encounter (Signed)
Returned a call to patient. She complains of feeling sluggish. Just not feeling well. She has appointment to see Dr. Sallyanne Kuster this Friday and wanted to know if her appointment can be moved up. She states that she is under lots of stress. Presently going through a separation. Recently had a TIA. She is concerned because she took her B/P and it was 110/70 with a 51 heart rate. I informed her that these numbers are okay. She then proceeds to tell me that without a doctors order she increased her metoprolol ER from 25 mg to 50 mg for a few weeks own her own. The last couple of days she has dropped back down to 25 mg. I informed her the increased dose of metoprolol can cause her symptoms. She should start to feel a little better now that she has decreased the medication. Recommended that she keep her scheduled appointment with Dr. Sallyanne Kuster this Friday 04/15/14. If her symptoms change and or increase she should go to the nearest ED or urgent care. Patient voiced her understanding of this.

## 2014-04-11 NOTE — Telephone Encounter (Signed)
Said since taking plavix she is feeling very draggy and tired that feels like she would like to be seen sooner.  Her BP low for her normal  51 HR  Please call

## 2014-04-15 ENCOUNTER — Encounter (HOSPITAL_COMMUNITY): Payer: Self-pay | Admitting: Pharmacy Technician

## 2014-04-15 ENCOUNTER — Ambulatory Visit (INDEPENDENT_AMBULATORY_CARE_PROVIDER_SITE_OTHER): Payer: Medicare Other | Admitting: Cardiovascular Disease

## 2014-04-15 ENCOUNTER — Encounter: Payer: Self-pay | Admitting: Cardiovascular Disease

## 2014-04-15 VITALS — BP 120/68 | HR 62 | Resp 16 | Ht 67.0 in | Wt 226.1 lb

## 2014-04-15 DIAGNOSIS — G459 Transient cerebral ischemic attack, unspecified: Secondary | ICD-10-CM

## 2014-04-15 DIAGNOSIS — R002 Palpitations: Secondary | ICD-10-CM

## 2014-04-15 MED ORDER — METOPROLOL SUCCINATE ER 50 MG PO TB24: ORAL_TABLET | ORAL | Status: DC

## 2014-04-15 NOTE — Patient Instructions (Signed)
Wean off of Metoprolol - take 1/4 tablet x 4 days then stop.  Dr. Sallyanne Kuster recommends you have a Loop Recorder implanted which is a device inserted under the skin on your chest.  This is an outpatient procedure done at [facility]

## 2014-04-17 ENCOUNTER — Encounter: Payer: Self-pay | Admitting: Cardiovascular Disease

## 2014-04-17 NOTE — Progress Notes (Signed)
[name], female   DOB: [birth year], 73 y.o.   MRN: [id number]      Reason for office visit Palpitations, recent TIA  [name] [name] was recently hospitalized for a TIA (left sided weakness and left facial numbness, left hand ataxia). CT and MRI of the head, carotid Dopplers, echocardiogram were all unrevealing. She was started on Plavix as part of the SOCRATES trial. She has mild HTN and long standing palpitations.  She wonders whether her symptoms were stress related (undergoing a separation from her husband of three years).  She has occasional palpitations which she can interrupt with the Valsalva maneuver. She has reduced her metoprolol dose because of fatigue.   Allergies  Allergen Reactions  . Caffeine Palpitations    Current Outpatient Prescriptions  Medication Sig Dispense Refill  . Calcium Carbonate-Vit D-Min (CALCIUM 1200 PO) Take 2 tablets by mouth 2 (two) times daily.       . clopidogrel (PLAVIX) 75 MG tablet Take 75 mg by mouth daily with breakfast.      . estradiol (ESTRACE) 0.1 MG/GM vaginal cream Place 1 Applicatorful vaginally 3 (three) times a week.       . metoprolol succinate (TOPROL-XL) 50 MG 24 hr tablet Take 1/4 tablet x 4 days then stop.   Take with or immediately following a meal.  30 tablet  0  . sertraline (ZOLOFT) 50 MG tablet Take 50 mg by mouth daily.       . simethicone (MYLICON) 276 MG chewable tablet Chew 125 mg by mouth every 6 (six) hours as needed for flatulence.      . Biotin 5000 MCG CAPS Take 1 capsule by mouth daily.      . cetirizine (ZYRTEC) 10 MG tablet Take 10 mg by mouth daily.      . cholecalciferol (VITAMIN D) 1000 UNITS tablet Take 2,000 Units by mouth daily.      Marland Kitchen Co-Enzyme Q10 100 MG CAPS Take 300 mg by mouth daily.      . diazepam (VALIUM) 5 MG tablet Take 5 mg by mouth every 6 (six) hours as needed for anxiety.      . folic acid (FOLVITE) 394 MCG tablet Take 400 mcg by mouth daily.      . Glucosamine-Chondroit-Vit  C-Mn (GLUCOSAMINE CHONDR 1500 COMPLX PO) Take 2 tablets by mouth daily.      . meclizine (ANTIVERT) 25 MG tablet Take 25 mg by mouth as needed for dizziness.       . RESVERATROL PO Take 1 capsule by mouth daily.      . vitamin E (VITAMIN E) 400 UNIT capsule Take 400 Units by mouth daily.       No current facility-administered medications for this visit.    Past Medical History  Diagnosis Date  . Hypertension   . Arthritis   . MVP (mitral valve prolapse)   . PVC's (premature ventricular contractions)   . Obesity     Past Surgical History  Procedure Laterality Date  . Excision vaginal cyst      benign  . Tubal ligation    . US echocardiography  05/15/2010    LA mildly dilated,mild mitral annular ca+, AOV mildly sclerotic, mild asymmetric LVH  . Nm myocar perf wall motion  12/18/2007    normal    Family History  Problem Relation Age of Onset  . Heart failure Mother   . Stroke Father   . Heart failure Brother     History  Social History  . Marital Status: Married    Spouse Name: N/A    Number of Children: N/A  . Years of Education: N/A   Occupational History  . Not on file.   Social History Main Topics  . Smoking status: Former Smoker -- 2.00 packs/day for 18 years    Types: Cigarettes  . Smokeless tobacco: Never Used  . Alcohol Use: Yes  . Drug Use: No  . Sexual Activity: Not on file   Other Topics Concern  . Not on file   Social History Narrative  . No narrative on file    Review of systems: The patient specifically denies any chest pain at rest or with exertion, dyspnea at rest or with exertion, orthopnea, paroxysmal nocturnal dyspnea, syncope, intermittent claudication, lower extremity edema, unexplained weight gain, cough, hemoptysis or wheezing.  The patient also denies abdominal pain, nausea, vomiting, dysphagia, diarrhea, constipation, polyuria, polydipsia, dysuria, hematuria, frequency, urgency, abnormal bleeding or bruising, fever, chills,  unexpected weight changes, mood swings, change in skin or hair texture, change in voice quality, auditory or visual problems, allergic reactions or rashes, new musculoskeletal complaints other than usual "aches and pains".   PHYSICAL EXAM BP 120/68  Pulse 62  Resp 16  Ht _0  (1.702 m)  Wt 226 lb 1.6 oz (102.558 kg)  BMI 35.40 kg/m2  General: Alert, oriented x3, no distress Head: no evidence of trauma, PERRL, EOMI, no exophtalmos or lid lag, no myxedema, no xanthelasma; normal ears, nose and oropharynx Neck: normal jugular venous pulsations and no hepatojugular reflux; brisk carotid pulses without delay and no carotid bruits Chest: clear to auscultation, no signs of consolidation by percussion or palpation, normal fremitus, symmetrical and full respiratory excursions Cardiovascular: normal position and quality of the apical impulse, regular rhythm, normal first and second heart sounds, no murmurs, rubs or gallops Abdomen: no tenderness or distention, no masses by palpation, no abnormal pulsatility or arterial bruits, normal bowel sounds, no hepatosplenomegaly Extremities: no clubbing, cyanosis or edema; 2+ radial, ulnar and brachial pulses bilaterally; 2+ right femoral, posterior tibial and dorsalis pedis pulses; 2+ left femoral, posterior tibial and dorsalis pedis pulses; no subclavian or femoral bruits Neurological: grossly nonfocal   EKG: NSR  Lipid Panel     Component Value Date/Time   CHOL 152 03/09/2014 0440   TRIG 87 03/09/2014 0440   HDL 58 03/09/2014 0440   CHOLHDL 2.6 03/09/2014 0440   VLDL 17 03/09/2014 0440   LDLCALC 77 03/09/2014 0440    BMET    Component Value Date/Time   NA 144 03/09/2014 0440   K 5.2 03/09/2014 0440   CL 104 03/09/2014 0440   CO2 27 03/09/2014 0440   GLUCOSE 98 03/09/2014 0440   BUN 22 03/09/2014 0440   CREATININE 0.87 03/09/2014 0440   CALCIUM 9.1 03/09/2014 0440   GFRNONAA 65* 03/09/2014 0440   GFRAA 75* 03/09/2014 0440     ASSESSMENT AND PLAN TIA (transient  ischemic attack) No known history or major risk factors for atrial fibrillation, except age >43. It is reasonable to place a loop recorder, especially since she has rapid palpitations. 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 A loop recorder will be valuable for arrhythmia diagnosis. Do not intend to stop the clopidogrel for the implantation.   Orders Placed This Encounter  Procedures  . LOOP RECORDER IMPLANT   Meds ordered this encounter  Medications  . clopidogrel (PLAVIX) 75 MG tablet  Sig: Take 75 mg by mouth daily with breakfast.  . DISCONTD: metoprolol succinate (TOPROL-XL) 50 MG 24 hr tablet    Sig: Take 25 mg by mouth daily. Take with or immediately following a meal.  . meclizine (ANTIVERT) 25 MG tablet    Sig: Take 25 mg by mouth as needed for dizziness.   . metoprolol succinate (TOPROL-XL) 50 MG 24 hr tablet    Sig: Take 1/4 tablet x 4 days then stop.   Take with or immediately following a meal.    Dispense:  30 tablet    Refill:  0    Phil Michels  Sanda Klein, MD, [facility] [phone] office [phone] pager

## 2014-04-17 NOTE — Assessment & Plan Note (Signed)
No known history or major risk factors for atrial fibrillation, except age >73. It is reasonable to place a loop recorder, especially since she has rapid palpitations. 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 A loop recorder will be valuable for arrhythmia diagnosis. Do not intend to stop the clopidogrel for the implantation.

## 2014-04-18 ENCOUNTER — Other Ambulatory Visit: Payer: Self-pay | Admitting: *Deleted

## 2014-04-18 DIAGNOSIS — R002 Palpitations: Secondary | ICD-10-CM

## 2014-04-19 ENCOUNTER — Encounter (HOSPITAL_COMMUNITY)
Admission: RE | Disposition: A | Discharge status: Home / Self Care | Payer: Self-pay | Source: Ambulatory Visit | Attending: Cardiovascular Disease

## 2014-04-19 ENCOUNTER — Ambulatory Visit (HOSPITAL_COMMUNITY)
Admission: RE | Admit: 2014-04-19 | Discharge: 2014-04-19 | Disposition: A | Discharge status: Home / Self Care | Payer: Medicare Other | Source: Ambulatory Visit | Attending: Cardiovascular Disease | Admitting: Cardiovascular Disease

## 2014-04-19 DIAGNOSIS — Z7902 Long term (current) use of antithrombotics/antiplatelets: Secondary | ICD-10-CM | POA: Diagnosis not present

## 2014-04-19 DIAGNOSIS — I10 Essential (primary) hypertension: Secondary | ICD-10-CM | POA: Diagnosis not present

## 2014-04-19 DIAGNOSIS — E669 Obesity, unspecified: Secondary | ICD-10-CM | POA: Insufficient documentation

## 2014-04-19 DIAGNOSIS — G459 Transient cerebral ischemic attack, unspecified: Secondary | ICD-10-CM

## 2014-04-19 DIAGNOSIS — I499 Cardiac arrhythmia, unspecified: Secondary | ICD-10-CM

## 2014-04-19 DIAGNOSIS — I6350 Cerebral infarction due to unspecified occlusion or stenosis of unspecified cerebral artery: Secondary | ICD-10-CM

## 2014-04-19 DIAGNOSIS — Z87891 Personal history of nicotine dependence: Secondary | ICD-10-CM | POA: Diagnosis not present

## 2014-04-19 DIAGNOSIS — Z8673 Personal history of transient ischemic attack (TIA), and cerebral infarction without residual deficits: Secondary | ICD-10-CM | POA: Diagnosis not present

## 2014-04-19 DIAGNOSIS — R002 Palpitations: Secondary | ICD-10-CM | POA: Insufficient documentation

## 2014-04-19 DIAGNOSIS — I4949 Other premature depolarization: Secondary | ICD-10-CM | POA: Diagnosis not present

## 2014-04-19 HISTORY — PX: LOOP RECORDER IMPLANT: SHX5477

## 2014-04-19 SURGERY — LOOP RECORDER IMPLANT
Anesthesia: LOCAL

## 2014-04-19 MED ORDER — LIDOCAINE-EPINEPHRINE 1 %-1:100000 IJ SOLN
INTRAMUSCULAR | Status: AC
  Filled 2014-04-19: qty 1

## 2014-04-19 NOTE — Interval H&P Note (Signed)
History and Physical Interval Note:  04/19/2014 2:34 PM  [name] [name]  has presented today for surgery, with the diagnosis of syncope  The various methods of treatment have been discussed with the patient and family. After consideration of risks, benefits and other options for treatment, the patient has consented to  Procedure(s): LOOP RECORDER IMPLANT (N/A) as a surgical intervention .  The patient's history has been reviewed, patient examined, no change in status, stable for surgery.  I have reviewed the patient's chart and labs.  Questions were answered to the patient's satisfaction.     Elin Seats

## 2014-04-19 NOTE — Op Note (Signed)
LOOP RECORDER IMPLANT   Procedure report  Procedure performed:  1. Loop recorder implantation  Reason for procedure:  1. Cryptogenic stroke/TIA, palpitations Procedure performed by:  Sanda Klein, MD  Complications:  None  Estimated blood loss:  <5 mL  Medications administered during procedure:  Lidocaine 1% w epi 15 mL locally Device details:  Medtronic Reveal Linq model number [id number], serial number [id number] Procedure details:  After the risks and benefits of the procedure were discussed the patient provided informed consent. He was brought to the cardiac catheter lab in the fasting state. The patient was prepped and draped in usual sterile fashion. Local anesthesia with 1% lidocaine was administered to an area 2 cm to the left of the sternum in the 4th intercostal space. A horizontal incision was made using the incision tool. The introducer was then used to create a subcutaneous tunnel and carefully deploy the device. Local pressure was held to ensure hemostasis.  The incision was closed with SteriStrips and a sterile dressing was applied.   Sanda Klein, MD, [place] [phone] office [phone] pager 04/19/2014 2:35 PM

## 2014-04-19 NOTE — Discharge Instructions (Signed)
HOME CARE INSTRUCTIONS GIVEN BY [employer].

## 2014-04-19 NOTE — H&P (View-Only) (Signed)
[name], female   DOB: 12/29/1940, 73 y.o.   MRN: [id number]      Reason for office visit Palpitations, recent TIA  [name] was recently hospitalized for a TIA (left sided weakness and left facial numbness, left hand ataxia). CT and MRI of the head, carotid Dopplers, echocardiogram were all unrevealing. She was started on Plavix as part of the SOCRATES trial. She has mild HTN and long standing palpitations.  She wonders whether her symptoms were stress related (undergoing a separation from her husband of three years).  She has occasional palpitations which she can interrupt with the Valsalva maneuver. She has reduced her metoprolol dose because of fatigue.   Allergies  Allergen Reactions  . Caffeine Palpitations    Current Outpatient Prescriptions  Medication Sig Dispense Refill  . Calcium Carbonate-Vit D-Min (CALCIUM 1200 PO) Take 2 tablets by mouth 2 (two) times daily.       . clopidogrel (PLAVIX) 75 MG tablet Take 75 mg by mouth daily with breakfast.      . estradiol (ESTRACE) 0.1 MG/GM vaginal cream Place 1 Applicatorful vaginally 3 (three) times a week.       . metoprolol succinate (TOPROL-XL) 50 MG 24 hr tablet Take 1/4 tablet x 4 days then stop.   Take with or immediately following a meal.  30 tablet  0  . sertraline (ZOLOFT) 50 MG tablet Take 50 mg by mouth daily.       . simethicone (MYLICON) 125 MG chewable tablet Chew 125 mg by mouth every 6 (six) hours as needed for flatulence.      . Biotin 5000 MCG CAPS Take 1 capsule by mouth daily.      . cetirizine (ZYRTEC) 10 MG tablet Take 10 mg by mouth daily.      . cholecalciferol (VITAMIN D) 1000 UNITS tablet Take 2,000 Units by mouth daily.      . Co-Enzyme Q10 100 MG CAPS Take 300 mg by mouth daily.      . diazepam (VALIUM) 5 MG tablet Take 5 mg by mouth every 6 (six) hours as needed for anxiety.      . folic acid (FOLVITE) 400 MCG tablet Take 400 mcg by mouth daily.      . Glucosamine-Chondroit-Vit  C-Mn (GLUCOSAMINE CHONDR 1500 COMPLX PO) Take 2 tablets by mouth daily.      . meclizine (ANTIVERT) 25 MG tablet Take 25 mg by mouth as needed for dizziness.       . RESVERATROL PO Take 1 capsule by mouth daily.      . vitamin E (VITAMIN E) 400 UNIT capsule Take 400 Units by mouth daily.       No current facility-administered medications for this visit.    Past Medical History  Diagnosis Date  . Hypertension   . Arthritis   . MVP (mitral valve prolapse)   . PVC's (premature ventricular contractions)   . Obesity     Past Surgical History  Procedure Laterality Date  . Excision vaginal cyst      benign  . Tubal ligation    . Us echocardiography  05/15/2010    LA mildly dilated,mild mitral annular ca+, AOV mildly sclerotic, mild asymmetric LVH  . Nm myocar perf wall motion  12/18/2007    normal    Family History  Problem Relation Age of Onset  . Heart failure Mother   . Stroke Father   . Heart failure Brother     History     Social History  . Marital Status: Married    Spouse Name: N/A    Number of Children: N/A  . Years of Education: N/A   Occupational History  . Not on file.   Social History Main Topics  . Smoking status: Former Smoker -- 2.00 packs/day for 18 years    Types: Cigarettes  . Smokeless tobacco: Never Used  . Alcohol Use: Yes  . Drug Use: No  . Sexual Activity: Not on file   Other Topics Concern  . Not on file   Social History Narrative  . No narrative on file    Review of systems: The patient specifically denies any chest pain at rest or with exertion, dyspnea at rest or with exertion, orthopnea, paroxysmal nocturnal dyspnea, syncope, intermittent claudication, lower extremity edema, unexplained weight gain, cough, hemoptysis or wheezing.  The patient also denies abdominal pain, nausea, vomiting, dysphagia, diarrhea, constipation, polyuria, polydipsia, dysuria, hematuria, frequency, urgency, abnormal bleeding or bruising, fever, chills,  unexpected weight changes, mood swings, change in skin or hair texture, change in voice quality, auditory or visual problems, allergic reactions or rashes, new musculoskeletal complaints other than usual "aches and pains".   PHYSICAL EXAM BP 120/68  Pulse 62  Resp 16  Ht 5' 7" (1.702 m)  Wt 226 lb 1.6 oz (102.558 kg)  BMI 35.40 kg/m2  General: Alert, oriented x3, no distress Head: no evidence of trauma, PERRL, EOMI, no exophtalmos or lid lag, no myxedema, no xanthelasma; normal ears, nose and oropharynx Neck: normal jugular venous pulsations and no hepatojugular reflux; brisk carotid pulses without delay and no carotid bruits Chest: clear to auscultation, no signs of consolidation by percussion or palpation, normal fremitus, symmetrical and full respiratory excursions Cardiovascular: normal position and quality of the apical impulse, regular rhythm, normal first and second heart sounds, no murmurs, rubs or gallops Abdomen: no tenderness or distention, no masses by palpation, no abnormal pulsatility or arterial bruits, normal bowel sounds, no hepatosplenomegaly Extremities: no clubbing, cyanosis or edema; 2+ radial, ulnar and brachial pulses bilaterally; 2+ right femoral, posterior tibial and dorsalis pedis pulses; 2+ left femoral, posterior tibial and dorsalis pedis pulses; no subclavian or femoral bruits Neurological: grossly nonfocal   EKG: NSR  Lipid Panel     Component Value Date/Time   CHOL 152 03/09/2014 0440   TRIG 87 03/09/2014 0440   HDL 58 03/09/2014 0440   CHOLHDL 2.6 03/09/2014 0440   VLDL 17 03/09/2014 0440   LDLCALC 77 03/09/2014 0440    BMET    Component Value Date/Time   NA 144 03/09/2014 0440   K 5.2 03/09/2014 0440   CL 104 03/09/2014 0440   CO2 27 03/09/2014 0440   GLUCOSE 98 03/09/2014 0440   BUN 22 03/09/2014 0440   CREATININE 0.87 03/09/2014 0440   CALCIUM 9.1 03/09/2014 0440   GFRNONAA 65* 03/09/2014 0440   GFRAA 75* 03/09/2014 0440     ASSESSMENT AND PLAN TIA (transient  ischemic attack) No known history or major risk factors for atrial fibrillation, except age >70. It is reasonable to place a loop recorder, especially since she has rapid palpitations. 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 A loop recorder will be valuable for arrhythmia diagnosis. Do not intend to stop the clopidogrel for the implantation.   Orders Placed This Encounter  Procedures  . LOOP RECORDER IMPLANT   Meds ordered this encounter  Medications  . clopidogrel (PLAVIX) 75 MG tablet      Sig: Take 75 mg by mouth daily with breakfast.  . DISCONTD: metoprolol succinate (TOPROL-XL) 50 MG 24 hr tablet    Sig: Take 25 mg by mouth daily. Take with or immediately following a meal.  . meclizine (ANTIVERT) 25 MG tablet    Sig: Take 25 mg by mouth as needed for dizziness.   . metoprolol succinate (TOPROL-XL) 50 MG 24 hr tablet    Sig: Take 1/4 tablet x 4 days then stop.   Take with or immediately following a meal.    Dispense:  30 tablet    Refill:  0    [name] Lefevre  Starlyn Droge, MD, FACC [facility] [phone] office [phone] pager   

## 2014-04-21 ENCOUNTER — Encounter: Payer: Self-pay | Admitting: *Deleted

## 2014-04-21 ENCOUNTER — Telehealth: Payer: Self-pay | Admitting: Cardiovascular Disease

## 2014-04-21 ENCOUNTER — Telehealth: Payer: Self-pay | Admitting: *Deleted

## 2014-04-21 NOTE — Telephone Encounter (Signed)
[name] [name] enrolled patient. Monitor now working. Pt aware.

## 2014-04-21 NOTE — Telephone Encounter (Signed)
Pt. Asked if her loop was working correctly Phone # [phone]

## 2014-04-21 NOTE — Telephone Encounter (Signed)
Pt had a loop recorder put in on Tuesday,04-19-14. She wants to know if it is doing what it is suppose to do.

## 2014-04-21 NOTE — Telephone Encounter (Signed)
[name] [name] called stated she didn't think her loop was working, shakila said it wasn't and to let you know

## 2014-04-21 NOTE — Telephone Encounter (Signed)
Spoke with patient and let her know her remote is transmitting.

## 2014-04-27 LAB — PACEMAKER DEVICE OBSERVATION

## 2014-04-28 LAB — PACEMAKER DEVICE OBSERVATION

## 2014-05-02 ENCOUNTER — Telehealth: Payer: Self-pay | Admitting: Cardiovascular Disease

## 2014-05-02 NOTE — Telephone Encounter (Signed)
Pt. Wanted you to know that she was on vacation and forgot about the monitor

## 2014-05-02 NOTE — Telephone Encounter (Signed)
Pt is on vacation at [facility] and she wanted you to know she forgot her monitor.

## 2014-05-02 NOTE — Telephone Encounter (Signed)
Will let Tobin Chad know to follow up on the downloads

## 2014-05-04 NOTE — Telephone Encounter (Signed)
Forwarded to [employer] as Juluis Rainier

## 2014-05-20 ENCOUNTER — Ambulatory Visit (INDEPENDENT_AMBULATORY_CARE_PROVIDER_SITE_OTHER): Payer: Medicare Other | Admitting: *Deleted

## 2014-05-20 DIAGNOSIS — G459 Transient cerebral ischemic attack, unspecified: Secondary | ICD-10-CM

## 2014-05-27 NOTE — Progress Notes (Signed)
Loop recorder

## 2014-05-30 ENCOUNTER — Telehealth: Payer: Self-pay | Admitting: Cardiovascular Disease

## 2014-05-30 NOTE — Telephone Encounter (Signed)
Forward to [employer] Patient has loop recorder

## 2014-05-30 NOTE — Telephone Encounter (Signed)
Please call-wants to see what was recorded from the weekend,she had some episodes.

## 2014-05-30 NOTE — Telephone Encounter (Signed)
LMOVM w/ my direct #. 

## 2014-05-31 NOTE — Telephone Encounter (Signed)
Dr. Rayann Heman spoke w/ [name] [name] at [facility]. [name] [name] would like for [place] to address anti-coagulation. Pt will be contacted by Claiborne Billings to stop plavix and start eliquis.

## 2014-06-01 ENCOUNTER — Telehealth: Payer: Self-pay | Admitting: Cardiovascular Disease

## 2014-06-01 DIAGNOSIS — F4323 Adjustment disorder with mixed anxiety and depressed mood: Secondary | ICD-10-CM | POA: Diagnosis not present

## 2014-06-01 NOTE — Telephone Encounter (Signed)
Pt says she still have not received a call since she called on Monday.

## 2014-06-02 ENCOUNTER — Telehealth: Payer: Self-pay | Admitting: *Deleted

## 2014-06-02 ENCOUNTER — Other Ambulatory Visit: Payer: Self-pay | Admitting: Neurology

## 2014-06-02 NOTE — Telephone Encounter (Signed)
Gave message from Lillian at Dr Kendell Bane office (to contact them) to research to give to Ms [name] [name] when she comes in for her 4:00 appt. today

## 2014-06-02 NOTE — Telephone Encounter (Signed)
Left patient a message for patient that her LINQ monitor showed afib and Dr Rayann Heman had spoken with Burnetta Sabin at [facility] in regards to her Plavix as she is in a study.  Dr Rayann Heman recommends stopping Plavix and starting Eliquis 74m twice daily.  As Dr C is out I have arranged for her to see LCecilie Kicks NP on 06/06/14 to discuss recommendations.

## 2014-06-06 ENCOUNTER — Ambulatory Visit (INDEPENDENT_AMBULATORY_CARE_PROVIDER_SITE_OTHER): Payer: Medicare Other | Admitting: Cardiology

## 2014-06-06 ENCOUNTER — Telehealth: Payer: Self-pay | Admitting: Cardiovascular Disease

## 2014-06-06 ENCOUNTER — Encounter: Payer: Self-pay | Admitting: Cardiology

## 2014-06-06 VITALS — BP 100/60 | HR 75 | Ht 67.0 in | Wt 216.0 lb

## 2014-06-06 DIAGNOSIS — I4819 Other persistent atrial fibrillation: Secondary | ICD-10-CM

## 2014-06-06 DIAGNOSIS — I495 Sick sinus syndrome: Secondary | ICD-10-CM

## 2014-06-06 DIAGNOSIS — I4891 Unspecified atrial fibrillation: Secondary | ICD-10-CM | POA: Diagnosis not present

## 2014-06-06 DIAGNOSIS — I480 Paroxysmal atrial fibrillation: Secondary | ICD-10-CM | POA: Insufficient documentation

## 2014-06-06 DIAGNOSIS — G459 Transient cerebral ischemic attack, unspecified: Secondary | ICD-10-CM | POA: Diagnosis not present

## 2014-06-06 MED ORDER — APIXABAN 5 MG PO TABS: 5.0000 mg | ORAL_TABLET | Freq: Two times a day (BID) | ORAL | Status: DC

## 2014-06-06 NOTE — Assessment & Plan Note (Signed)
Now with documented atrial fib. Have added anticoagulant.

## 2014-06-06 NOTE — Progress Notes (Signed)
06/06/2014   PCP: Reginia Naas, MD   Chief Complaint  Patient presents with  . Follow-up    new a fibb    Primary Cardiologist:Dr. Jerilynn Mages. Croitoru   HPI: Pt here today after found to be in a fib on loop recorder.  She was recently hospitalized for TIA and rec'd loop recorder to rule out PAF as cause.  PAF is noted, HR 124.  She also relates her HR is down to 50 at times. She was started on Eliquis last Thursday and her Plavix was stopped.    Today she is in SR rate of 75, with 1st degree AV block.  PR 212 ms. She is concerned about her slow HR and her fast HR.  She had been on Toprol in the past but was stopped due to fatigue.  When her HR was 124 she did not feel well.   Allergies  Allergen Reactions  . Caffeine Palpitations    Current Outpatient Prescriptions  Medication Sig Dispense Refill  . Biotin 5000 MCG CAPS Take 1 capsule by mouth daily.      . Calcium Carbonate-Vit D-Min (CALCIUM 1200 PO) Take 2 tablets by mouth 2 (two) times daily.       . cholecalciferol (VITAMIN D) 1000 UNITS tablet Take 2,000 Units by mouth daily.      Marland Kitchen Co-Enzyme Q10 100 MG CAPS Take 300 mg by mouth daily.      . diazepam (VALIUM) 5 MG tablet Take 5 mg by mouth every 6 (six) hours as needed for anxiety.      Marland Kitchen estradiol (ESTRACE) 0.1 MG/GM vaginal cream Place 1 Applicatorful vaginally 3 (three) times a week.       . folic acid (FOLVITE) 335 MCG tablet Take 400 mcg by mouth daily.      . Glucosamine-Chondroit-Vit C-Mn (GLUCOSAMINE CHONDR 1500 COMPLX PO) Take 2 tablets by mouth daily.      . meclizine (ANTIVERT) 25 MG tablet Take 25 mg by mouth as needed for dizziness.       . RESVERATROL PO Take 1 capsule by mouth daily.      . sertraline (ZOLOFT) 50 MG tablet Take 50 mg by mouth daily.       . vitamin E (VITAMIN E) 400 UNIT capsule Take 400 Units by mouth daily.      Marland Kitchen apixaban (ELIQUIS) 5 MG TABS tablet Take 1 tablet (5 mg total) by mouth 2 (two) times daily.  60 tablet  6    No current facility-administered medications for this visit.    Past Medical History  Diagnosis Date  . Hypertension   . Arthritis   . MVP (mitral valve prolapse)   . PVC's (premature ventricular contractions)   . Obesity     Past Surgical History  Procedure Laterality Date  . Excision vaginal cyst      benign  . Tubal ligation    . US echocardiography  05/15/2010    LA mildly dilated,mild mitral annular ca+, AOV mildly sclerotic, mild asymmetric LVH  . Nm myocar perf wall motion  12/18/2007    normal    KTG:YBWLSLH:TD colds or fevers, + weight loss of 15 pounds, she has been trying. Skin:no rashes or ulcers HEENT:no blurred vision, no congestion CV:see HPI PUL:see HPI GI:no diarrhea constipation or melena, no indigestion GU:no hematuria, no dysuria MS:no joint pain, no claudication Neuro:no syncope, no lightheadedness Endo:no diabetes, no thyroid disease  Wt Readings from Last 3  Encounters:  06/06/14 216 lb (97.977 kg)  04/15/14 226 lb 1.6 oz (102.558 kg)  03/08/14 231 lb 7.7 oz (104.999 kg)    PHYSICAL EXAM BP 100/60  Pulse 75  Ht _0  (1.702 m)  Wt 216 lb (97.977 kg)  BMI 33.82 kg/m2 General:Pleasant affect, NAD Skin:Warm and dry, brisk capillary refill HEENT:normocephalic, sclera clear, mucus membranes moist Neck:supple, no JVD, no bruits  Heart:S1S2 RRR without murmur, gallup, rub or click Lungs:clear without rales, rhonchi, or wheezes KNL:ZJQB, non tender, + BS, do not palpate liver spleen or masses Ext:no lower ext edema, 2+ pedal pulses, 2+ radial pulses Neuro:alert and oriented, MAE, follows commands, + facial symmetry Psych:  Pt anxious about fast HR.  EKG:SR with PACs, no acute changes.  ASSESSMENT AND PLAN PAF (paroxysmal atrial fibrillation) After loop implanted pt has had episodes of PAF with rate at 120 at times.  She has been started on eliquis and her plavix stopped. She has no bleeding. Her eliquis will be quite expensive and other  agents will be as well.  She could take coumadin, but is preferring Eliquis.  We gave samples of eliquis and 30 day free card to allow her time to decide on treatment of eliquis vs. coumadin.     I will see her back on a day Dr. Jerilynn Mages. Croitoru is in the office in 2-3 weeks.    TIA (transient ischemic attack) Now with documented atrial fib. Have added anticoagulant.  Tachy-brady syndrome Pt relates her HR is down to 50 at some OV with Neuro. I do not have these records. Previously she had been on toprol but stopped due to fatigue.  Will discuss with Dr. Sallyanne Kuster.    Pt became tearful with the cost of eliquis.

## 2014-06-06 NOTE — Assessment & Plan Note (Addendum)
Pt relates her HR is down to 50 at some OV with Neuro. I do not have these records. Previously she had been on toprol but stopped due to fatigue.  Will discuss with Dr. Sallyanne Kuster.

## 2014-06-06 NOTE — Patient Instructions (Signed)
1. Your physician recommends that you schedule a follow-up appointment in: September to see Dr. Danford Bad  2. Your physician recommends that you schedule a follow-up appointment in: 3-4 weeks to see Cecilie Kicks on a day that Dr. Sallyanne Kuster is here

## 2014-06-06 NOTE — Telephone Encounter (Signed)
Patient has appt on 07/06 with Dr Dorene Ar, notes say D/C Plavix, starting Eliquis for AF

## 2014-06-06 NOTE — Telephone Encounter (Signed)
REVIEWED PREVIOUS   PHONE NOTE   PT  IS  TO  F/U WITH LAURA INGOLD NP  AT    3:00  TODAY  AND  ALSO HAS   SWITCHED MEDS AS   STATED IN NOTE  PT  WILL KEEP APPT  TO  DISCUSS  RECOMMENDATIONS./CY

## 2014-06-06 NOTE — Telephone Encounter (Signed)
New message     Returning [facility] call from last week.

## 2014-06-06 NOTE — Assessment & Plan Note (Signed)
After loop implanted pt has had episodes of PAF with rate at 120 at times.  She has been started on eliquis and her plavix stopped. She has no bleeding. Her eliquis will be quite expensive and other agents will be as well.  She could take coumadin, but is preferring Eliquis.  We gave samples of eliquis and 30 day free card to allow her time to decide on treatment of eliquis vs. coumadin.     I will see her back on a day Dr. Jerilynn Mages. Croitoru is in the office in 2-3 weeks.

## 2014-06-07 LAB — BASIC METABOLIC PANEL WITH GFR
BUN: 15 mg/dL (ref 6–23)
CHLORIDE: 101 meq/L (ref 96–112)
CO2: 28 mEq/L (ref 19–32)
Calcium: 9.6 mg/dL (ref 8.4–10.5)
Creat: 1 mg/dL (ref 0.50–1.10)
GFR, EST NON AFRICAN AMERICAN: 56 mL/min — AB
GFR, Est African American: 65 mL/min
GLUCOSE: 95 mg/dL (ref 70–99)
POTASSIUM: 4.4 meq/L (ref 3.5–5.3)
Sodium: 138 mEq/L (ref 135–145)

## 2014-06-07 LAB — TSH: TSH: 1.529 u[IU]/mL (ref 0.350–4.500)

## 2014-06-08 ENCOUNTER — Telehealth: Payer: Self-pay | Admitting: Cardiovascular Disease

## 2014-06-08 NOTE — Telephone Encounter (Signed)
Pt says her loop recorder is not feeling right.

## 2014-06-08 NOTE — Telephone Encounter (Signed)
Left message for pt to call

## 2014-06-20 ENCOUNTER — Ambulatory Visit (INDEPENDENT_AMBULATORY_CARE_PROVIDER_SITE_OTHER): Payer: Medicare Other | Admitting: *Deleted

## 2014-06-20 DIAGNOSIS — I4891 Unspecified atrial fibrillation: Secondary | ICD-10-CM

## 2014-06-20 DIAGNOSIS — I480 Paroxysmal atrial fibrillation: Secondary | ICD-10-CM

## 2014-06-22 NOTE — Progress Notes (Signed)
Loop recorder 

## 2014-06-27 ENCOUNTER — Encounter: Payer: Self-pay | Admitting: Cardiovascular Disease

## 2014-06-27 ENCOUNTER — Ambulatory Visit (INDEPENDENT_AMBULATORY_CARE_PROVIDER_SITE_OTHER): Payer: Medicare Other | Admitting: Cardiology

## 2014-06-27 ENCOUNTER — Encounter: Payer: Self-pay | Admitting: Cardiology

## 2014-06-27 VITALS — BP 111/70 | HR 63 | Ht 67.0 in | Wt 214.0 lb

## 2014-06-27 DIAGNOSIS — Z7901 Long term (current) use of anticoagulants: Secondary | ICD-10-CM | POA: Diagnosis not present

## 2014-06-27 DIAGNOSIS — I10 Essential (primary) hypertension: Secondary | ICD-10-CM | POA: Diagnosis not present

## 2014-06-27 DIAGNOSIS — R300 Dysuria: Secondary | ICD-10-CM | POA: Diagnosis not present

## 2014-06-27 DIAGNOSIS — N3000 Acute cystitis without hematuria: Secondary | ICD-10-CM

## 2014-06-27 DIAGNOSIS — G459 Transient cerebral ischemic attack, unspecified: Secondary | ICD-10-CM

## 2014-06-27 DIAGNOSIS — I480 Paroxysmal atrial fibrillation: Secondary | ICD-10-CM

## 2014-06-27 DIAGNOSIS — I4891 Unspecified atrial fibrillation: Secondary | ICD-10-CM

## 2014-06-27 DIAGNOSIS — N390 Urinary tract infection, site not specified: Secondary | ICD-10-CM | POA: Insufficient documentation

## 2014-06-27 DIAGNOSIS — Z79899 Other long term (current) drug therapy: Secondary | ICD-10-CM | POA: Insufficient documentation

## 2014-06-27 DIAGNOSIS — Z5181 Encounter for therapeutic drug level monitoring: Secondary | ICD-10-CM | POA: Insufficient documentation

## 2014-06-27 MED ORDER — CIPROFLOXACIN HCL 250 MG PO TABS: 250.0000 mg | ORAL_TABLET | Freq: Two times a day (BID) | ORAL | Status: DC

## 2014-06-27 NOTE — Assessment & Plan Note (Signed)
BP borderline without meds.

## 2014-06-27 NOTE — Progress Notes (Addendum)
06/27/2014   PCP: Reginia Naas, MD   Chief Complaint  Patient presents with  . Appointment    sob    Primary Cardiologist: Dr. Bertrum Sol   HPI:  73 year old female back today for follow up. She was recently hospitalized for TIA and rec'd loop recorder to rule out PAF as cause. PAF is noted, HR 124. She also relates her HR is down to 50 at times. She was started on Eliquis last Thursday and her Plavix was stopped.  Today she is in SR rate of 63, with borderline 1st degree AV block. PR  186 ms. She is concerned about her slow HR and her fast HR. She had been on Toprol in the past but was stopped due to fatigue. When her HR was 124 she did not feel well.    Today she presents to decide if she wishes to continue with eliquis or change to coumadin which would be easier to afford.  She was supposed to be scheduled on day with Dr. Jerilynn Mages. Croitoru in the office but that did not happen.  I gave her the choice to re schedule.    She had bad days last week with reported BP by home machine of 88 systolic.  With loop it did appear at least on one of the days her BP was low she had atrial fib.  Rate with better control.  Pt is stressed, though not as tearful today.  She is going through many role changes and is now afraid she may die with a stroke.  She realizes she needs to find things to do she enjoys. She is not yet there.  We did discuss stroke support group which may help- the phone number was given.    She also complains of urinary burning. She has freq UTIs, usually treated with Cipro.     Allergies  Allergen Reactions  . Caffeine Palpitations    Current Outpatient Prescriptions  Medication Sig Dispense Refill  . apixaban (ELIQUIS) 5 MG TABS tablet Take 1 tablet (5 mg total) by mouth 2 (two) times daily.  60 tablet  6  . Biotin 5000 MCG CAPS Take 1 capsule by mouth daily.      . Calcium Carbonate-Vit D-Min (CALCIUM 1200 PO) Take 2 tablets by mouth 2 (two) times daily.        . cholecalciferol (VITAMIN D) 1000 UNITS tablet Take 2,000 Units by mouth daily.      Marland Kitchen Co-Enzyme Q10 100 MG CAPS Take 300 mg by mouth daily.      . diazepam (VALIUM) 5 MG tablet Take 5 mg by mouth every 6 (six) hours as needed for anxiety.      Marland Kitchen estradiol (ESTRACE) 0.1 MG/GM vaginal cream Place 1 Applicatorful vaginally 3 (three) times a week.       . folic acid (FOLVITE) 244 MCG tablet Take 400 mcg by mouth daily.      . Glucosamine-Chondroit-Vit C-Mn (GLUCOSAMINE CHONDR 1500 COMPLX PO) Take 2 tablets by mouth daily.      Marland Kitchen MAGNESIUM CITRATE PO Take by mouth. 1 tab daily      . meclizine (ANTIVERT) 25 MG tablet Take 25 mg by mouth as needed for dizziness.       . NON FORMULARY All  cleor      . RESVERATROL PO Take 1 capsule by mouth daily.      . sertraline (ZOLOFT) 50 MG tablet Take 50 mg  by mouth daily.       . vitamin E (VITAMIN E) 400 UNIT capsule Take 400 Units by mouth daily.      . ciprofloxacin (CIPRO) 250 MG tablet Take 1 tablet (250 mg total) by mouth 2 (two) times daily.  10 tablet  0   No current facility-administered medications for this visit.    Past Medical History  Diagnosis Date  . Hypertension   . Arthritis   . MVP (mitral valve prolapse)   . PVC's (premature ventricular contractions)   . Obesity     Past Surgical History  Procedure Laterality Date  . Excision vaginal cyst      benign  . Tubal ligation    . US echocardiography  05/15/2010    LA mildly dilated,mild mitral annular ca+, AOV mildly sclerotic, mild asymmetric LVH  . Nm myocar perf wall motion  12/18/2007    normal    BHA:LPFXTKW:IO colds or fevers, + weight loss pt has been trying- she was congratulated.  Skin:no rashes or ulcers HEENT:no blurred vision, no congestion CV:see HPI PUL:see HPI GI:no diarrhea constipation or melena, no indigestion GU:no hematuria, no dysuria MS:no joint pain, no claudication Neuro:no syncope, + lightheadedness when her BP is low. Endo:no diabetes, no  thyroid disease  Wt Readings from Last 3 Encounters:  06/27/14 214 lb (97.07 kg)  06/06/14 216 lb (97.977 kg)  04/15/14 226 lb 1.6 oz (102.558 kg)    PHYSICAL EXAM BP 111/70  Pulse 63  Ht _0  (1.702 m)  Wt 214 lb (97.07 kg)  BMI 33.51 kg/m2 BP lying 110/62, p 62 BP sitting 104/61 p 63 Standing  107/61 P 70  Standing after 3 min  97/63  General:Pleasant affect, NAD Skin:Warm and dry, brisk capillary refill HEENT:normocephalic, sclera clear, mucus membranes moist Neck:supple, no JVD, no bruits  Heart:S1S2 RRR without murmur, gallup, rub or click Lungs:clear without rales, rhonchi, or wheezes XBD:ZHGD, non tender, + BS, do not palpate liver spleen or masses Ext:no lower ext edema, 2+ pedal pulses, 2+ radial pulses Neuro:alert and oriented, MAE, follows commands, + facial symmetry   EKG:SR with PACs  ASSESSMENT AND PLAN PAF (paroxysmal atrial fibrillation) Still with episodes of PAF and she feels very tired and BP with her cuff at home has systolic BP of 88.  Loop with A fib detected on one of the days she felt bad.  Her BP is borderline- she may need antiarrythmic. Will defer to Dr. Jerilynn Mages. Croitoru.  Will have him review chart and decide.  Chronic anticoagulation Since TIA/CVA she has been on eliquis but it is pricey for her.  She would like to go to coumadin but wants to talk with Dr. Jerilynn Mages. Croitoru first.    HTN (hypertension) BP borderline without meds.    UTI (urinary tract infection), prob Check u/a and add Cipro 250 mg BID for 5 days.    Will have Dr. Jerilynn Mages. Croitoru weigh in and make follow up according to his recommendation.

## 2014-06-27 NOTE — Assessment & Plan Note (Signed)
Since TIA/CVA she has been on eliquis but it is pricey for her.  She would like to go to coumadin but wants to talk with Dr. Jerilynn Mages. Croitoru first.

## 2014-06-27 NOTE — Assessment & Plan Note (Signed)
Still with episodes of PAF and she feels very tired and BP with her cuff at home has systolic BP of 88.  Loop with A fib detected on one of the days she felt bad.  Her BP is borderline- she may need antiarrythmic. Will defer to Dr. Jerilynn Mages. Croitoru.  Will have him review chart and decide.

## 2014-06-27 NOTE — Assessment & Plan Note (Signed)
Check u/a and add Cipro 250 mg BID for 5 days.

## 2014-06-27 NOTE — Patient Instructions (Addendum)
[name] Kicks, NP, has ordered a urinalysis to assess for bladder infection.   Your physician has recommended making the following medication changes:  START Cipro 250 mg - take 1 tablet twice daily for FIVE DAYS  We will call you with a follow-up appointment.  [place] stroke support group  [phone]  Try to call - they meet the 3rd Sunday of the month, except not in June, July and August.

## 2014-06-28 ENCOUNTER — Encounter: Payer: Self-pay | Admitting: Cardiovascular Disease

## 2014-06-28 ENCOUNTER — Telehealth: Payer: Self-pay | Admitting: *Deleted

## 2014-06-28 ENCOUNTER — Encounter: Payer: Self-pay | Admitting: Cardiology

## 2014-06-28 LAB — URINALYSIS, ROUTINE W REFLEX MICROSCOPIC
BILIRUBIN URINE: NEGATIVE
Glucose, UA: NEGATIVE mg/dL
Hgb urine dipstick: NEGATIVE
Ketones, ur: NEGATIVE mg/dL
NITRITE: NEGATIVE
Protein, ur: NEGATIVE mg/dL
SPECIFIC GRAVITY, URINE: 1.012 (ref 1.005–1.030)
UROBILINOGEN UA: 0.2 mg/dL (ref 0.0–1.0)
pH: 7 (ref 5.0–8.0)

## 2014-06-28 LAB — URINALYSIS, MICROSCOPIC ONLY
CRYSTALS: NONE SEEN
Casts: NONE SEEN

## 2014-06-28 MED ORDER — DIGOXIN 125 MCG PO TABS: 0.1250 mg | ORAL_TABLET | Freq: Every day | ORAL | Status: DC

## 2014-06-28 NOTE — Telephone Encounter (Signed)
Please ask Cyril Mourning to talk with pt about coumadin, though pt was going to wait one more month before she switched.  Also please let pt know about lanoxin that Dr. Ginnie Smart wants to start.  This will not cause BP problems and then when she does see Dr. Jerilynn Mages. Croitoru they will discuss further options.  She needs to be reassured.  Thank you.

## 2014-06-28 NOTE — Telephone Encounter (Signed)
Forward to [employer]

## 2014-06-28 NOTE — Telephone Encounter (Signed)
Returning your call.

## 2014-06-28 NOTE — Telephone Encounter (Signed)
MESSAGE FROM DR CROITORU--Please ask her to start digoxin 0.125 mg daily - this will help reduce HR in AF without causing low BP or dropping HR during normal rhythm. I have no problem with the switch to warfarin, but will ask Cyril Mourning to see if there is a cheaper NOAC option for her, if not to coordinate the transition to warfarin. Will review need for antiarrhythmics after we have a little longer data recording - Her appt in September will be a good time to review MCr   ----- Message ----- From: [name] Kicks, NP Sent: 06/27/2014 7:41 PM To: Sanda Klein, MD Please review, pt was supposed to follow up with me on day you were in the office, but she was scheduled on the wrong half of your half day. She has PAF with hypotension and on visits she is borderline hypotensive. She has the loop. ? Antiarrythmic - please let me know, though she is very anxious and will want to see you to discuss before she changes anything. Thank you  [name]. INFORMATION GIVEN TO START DIGOXIN 0.125MG-- E-SENT  RX - [facility]  WILLROUTE TO kristin to handle the warfarin vs cheaper  NOAC

## 2014-06-29 MED ORDER — RIVAROXABAN 20 MG PO TABS: 20.0000 mg | ORAL_TABLET | Freq: Every day | ORAL | Status: DC

## 2014-06-29 NOTE — Telephone Encounter (Signed)
Spoke with patient - her insurance copay for Eliquis $300/month.  Will try Xarelto 64m for her to see if copay is cheaper.   Rx sent to [facility] [place] pt is to call with copay information before picking up.  Explained SE profile and bleeding concerns with patient.  Pt voiced understanding.  If still cost prohibitive, will consider switch to warfarin

## 2014-06-29 NOTE — Telephone Encounter (Signed)
Pt has been seen

## 2014-06-30 ENCOUNTER — Telehealth: Payer: Self-pay | Admitting: *Deleted

## 2014-06-30 MED ORDER — APIXABAN 5 MG PO TABS: 5.0000 mg | ORAL_TABLET | Freq: Two times a day (BID) | ORAL | Status: DC

## 2014-06-30 NOTE — Telephone Encounter (Signed)
Samples of eliquis 5 mg 4 boxes   lot [id number]  exp 10/2016

## 2014-06-30 NOTE — Telephone Encounter (Signed)
Enter in error

## 2014-07-08 DIAGNOSIS — F4323 Adjustment disorder with mixed anxiety and depressed mood: Secondary | ICD-10-CM | POA: Diagnosis not present

## 2014-07-18 ENCOUNTER — Encounter: Payer: Self-pay | Admitting: Cardiovascular Disease

## 2014-07-19 ENCOUNTER — Ambulatory Visit (INDEPENDENT_AMBULATORY_CARE_PROVIDER_SITE_OTHER): Payer: Medicare Other | Admitting: *Deleted

## 2014-07-19 DIAGNOSIS — G459 Transient cerebral ischemic attack, unspecified: Secondary | ICD-10-CM | POA: Diagnosis not present

## 2014-07-22 NOTE — Progress Notes (Signed)
Loop recorder 

## 2014-07-25 ENCOUNTER — Telehealth: Payer: Self-pay | Admitting: Pharmacist Clinician (PhC)/ Clinical Pharmacy Specialist

## 2014-07-25 NOTE — Telephone Encounter (Signed)
Pt LMOM, has filled out assistance paperwork for Eliquis, needs [place] to fill out our portion.  Also needs samples.  Returned call [facility], have samples for patient to pick up,  Need to leave paperwork, with proof of income and pharamcy expenses for 2015.  We will sign and fax to [facility]

## 2014-07-26 DIAGNOSIS — F4323 Adjustment disorder with mixed anxiety and depressed mood: Secondary | ICD-10-CM | POA: Diagnosis not present

## 2014-07-27 DIAGNOSIS — L821 Other seborrheic keratosis: Secondary | ICD-10-CM | POA: Diagnosis not present

## 2014-07-27 DIAGNOSIS — D485 Neoplasm of uncertain behavior of skin: Secondary | ICD-10-CM | POA: Diagnosis not present

## 2014-07-27 DIAGNOSIS — L570 Actinic keratosis: Secondary | ICD-10-CM | POA: Diagnosis not present

## 2014-08-01 LAB — MDC_IDC_ENUM_SESS_TYPE_REMOTE: Date Time Interrogation Session: 20150722040500

## 2014-08-09 DIAGNOSIS — F4323 Adjustment disorder with mixed anxiety and depressed mood: Secondary | ICD-10-CM | POA: Diagnosis not present

## 2014-08-10 ENCOUNTER — Telehealth: Payer: Self-pay | Admitting: Cardiovascular Disease

## 2014-08-10 ENCOUNTER — Encounter: Payer: Self-pay | Admitting: Cardiovascular Disease

## 2014-08-11 ENCOUNTER — Encounter: Payer: Self-pay | Admitting: Cardiovascular Disease

## 2014-08-11 ENCOUNTER — Encounter: Payer: Medicare Other | Admitting: Cardiovascular Disease

## 2014-08-12 ENCOUNTER — Telehealth: Payer: Self-pay | Admitting: Pharmacist Clinician (PhC)/ Clinical Pharmacy Specialist

## 2014-08-12 LAB — MDC_IDC_ENUM_SESS_TYPE_REMOTE

## 2014-08-12 NOTE — Telephone Encounter (Signed)
Pt LMOM this am, concerned that with recent weight loss (25+ pounds), she may be on wrong dose of Eliquis.  Returned call, LMOM, explained guidelines for decreasing dose, she is still appropriately dosed at 26m twice daily.

## 2014-08-15 NOTE — Telephone Encounter (Signed)
Closed encounter °

## 2014-08-17 LAB — MDC_IDC_ENUM_SESS_TYPE_REMOTE: Date Time Interrogation Session: 20150813040500

## 2014-08-19 ENCOUNTER — Ambulatory Visit (INDEPENDENT_AMBULATORY_CARE_PROVIDER_SITE_OTHER): Payer: Medicare Other | Admitting: *Deleted

## 2014-08-19 DIAGNOSIS — G459 Transient cerebral ischemic attack, unspecified: Secondary | ICD-10-CM | POA: Diagnosis not present

## 2014-08-20 ENCOUNTER — Other Ambulatory Visit: Payer: Self-pay | Admitting: Family Medicine

## 2014-08-20 ENCOUNTER — Encounter: Payer: Self-pay | Admitting: Cardiovascular Disease

## 2014-08-20 ENCOUNTER — Encounter: Payer: Self-pay | Admitting: Family Medicine

## 2014-08-20 ENCOUNTER — Encounter: Payer: Self-pay | Admitting: Anesthesiology

## 2014-08-23 LAB — MDC_IDC_ENUM_SESS_TYPE_REMOTE

## 2014-08-23 NOTE — Progress Notes (Signed)
Loop recorder 

## 2014-08-30 ENCOUNTER — Encounter: Payer: Self-pay | Admitting: Cardiovascular Disease

## 2014-08-31 ENCOUNTER — Encounter: Payer: Self-pay | Admitting: Cardiovascular Disease

## 2014-08-31 ENCOUNTER — Other Ambulatory Visit: Payer: Self-pay | Admitting: *Deleted

## 2014-08-31 MED ORDER — APIXABAN 5 MG PO TABS: 5.0000 mg | ORAL_TABLET | Freq: Two times a day (BID) | ORAL | Status: DC

## 2014-08-31 NOTE — Telephone Encounter (Signed)
Rx for Eliquis 3 month supply faxed to [employer] [phone] for patient assistance.

## 2014-09-01 ENCOUNTER — Telehealth: Payer: Self-pay | Admitting: Nurse Practitioner

## 2014-09-01 ENCOUNTER — Ambulatory Visit: Payer: Self-pay | Admitting: Neurology

## 2014-09-01 ENCOUNTER — Ambulatory Visit: Payer: Self-pay | Admitting: Nurse Practitioner

## 2014-09-01 NOTE — Telephone Encounter (Signed)
Patient was no show for today's office appointment.

## 2014-09-06 ENCOUNTER — Telehealth: Payer: Self-pay | Admitting: Neurology

## 2014-09-07 ENCOUNTER — Telehealth: Payer: Self-pay

## 2014-09-07 NOTE — Telephone Encounter (Signed)
I returned patient's call and left a voice mail message to call me back with my phone number.  I called both her home and cell numbers.

## 2014-09-07 NOTE — Telephone Encounter (Signed)
Pt. Called regarding research trial. I told the pt. That Research Coordinator will return her call.

## 2014-09-12 ENCOUNTER — Ambulatory Visit: Payer: Self-pay | Admitting: Internal Medicine

## 2014-09-12 ENCOUNTER — Telehealth: Payer: Self-pay | Admitting: Pharmacist Clinician (PhC)/ Clinical Pharmacy Specialist

## 2014-09-12 NOTE — Telephone Encounter (Signed)
Gave 2 weeks samples Eliquis.  Also gave card for 30 day free Xarelto (don't believe pt has used previously), to switch in case we are unable to sample.

## 2014-09-16 ENCOUNTER — Ambulatory Visit (INDEPENDENT_AMBULATORY_CARE_PROVIDER_SITE_OTHER): Payer: Medicare Other | Admitting: *Deleted

## 2014-09-16 DIAGNOSIS — G459 Transient cerebral ischemic attack, unspecified: Secondary | ICD-10-CM

## 2014-09-19 DIAGNOSIS — N390 Urinary tract infection, site not specified: Secondary | ICD-10-CM | POA: Diagnosis not present

## 2014-09-19 DIAGNOSIS — Z23 Encounter for immunization: Secondary | ICD-10-CM | POA: Diagnosis not present

## 2014-09-22 ENCOUNTER — Telehealth: Payer: Self-pay | Admitting: *Deleted

## 2014-09-22 NOTE — Telephone Encounter (Signed)
Pt had pause episode on 09-18-14 around 129 am. Pt was sleeping at time of episode. Pt had episode on 10-17 in the pm where pt felt real tired and not felt well at all. Based on recordings from [facility], pt went into AF around 1804 that lasted 14 hours 46 minutes. Pt aware of pause episode and is concerned.

## 2014-09-26 ENCOUNTER — Ambulatory Visit (INDEPENDENT_AMBULATORY_CARE_PROVIDER_SITE_OTHER): Payer: Medicare Other | Admitting: *Deleted

## 2014-09-26 ENCOUNTER — Encounter: Payer: Self-pay | Admitting: Cardiovascular Disease

## 2014-09-26 ENCOUNTER — Ambulatory Visit (INDEPENDENT_AMBULATORY_CARE_PROVIDER_SITE_OTHER): Payer: Medicare Other | Admitting: Cardiovascular Disease

## 2014-09-26 ENCOUNTER — Other Ambulatory Visit: Payer: Self-pay | Admitting: Cardiovascular Disease

## 2014-09-26 VITALS — BP 112/66 | HR 62 | Resp 16 | Ht 67.0 in | Wt 209.0 lb

## 2014-09-26 DIAGNOSIS — R5381 Other malaise: Secondary | ICD-10-CM

## 2014-09-26 DIAGNOSIS — I480 Paroxysmal atrial fibrillation: Secondary | ICD-10-CM

## 2014-09-26 DIAGNOSIS — I455 Other specified heart block: Secondary | ICD-10-CM

## 2014-09-26 DIAGNOSIS — Z7901 Long term (current) use of anticoagulants: Secondary | ICD-10-CM

## 2014-09-26 DIAGNOSIS — I495 Sick sinus syndrome: Secondary | ICD-10-CM

## 2014-09-26 DIAGNOSIS — G459 Transient cerebral ischemic attack, unspecified: Secondary | ICD-10-CM

## 2014-09-26 DIAGNOSIS — I4891 Unspecified atrial fibrillation: Secondary | ICD-10-CM

## 2014-09-26 DIAGNOSIS — Z79899 Other long term (current) drug therapy: Secondary | ICD-10-CM | POA: Diagnosis not present

## 2014-09-26 DIAGNOSIS — I10 Essential (primary) hypertension: Secondary | ICD-10-CM

## 2014-09-26 LAB — MDC_IDC_ENUM_SESS_TYPE_INCLINIC

## 2014-09-26 NOTE — Assessment & Plan Note (Signed)
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 We discussed options for therapy including implantation of a pacemaker or referral for radiofrequency ablation. While a successful ablation might prevent atrial fibrillation and thus prevents the postconversion pauses, it will have no significant impact on her fatigue which is probably due to chronotropic incompetence.  We discussed pacemaker implantation in detail. Reviewed the potential procedural complications as well as the risks and benefits of long-term pacemaker therapy. She understands and agrees to proceed. She will stop her anticoagulant 24 hours before the procedure and resume it as soon as we are done. The loop recorder will be explanted at the time of the pacemaker implantation.

## 2014-09-26 NOTE — Patient Instructions (Signed)
Your physician has recommended that you have a pacemaker inserted. A pacemaker is a small device that is placed under the skin of your chest or abdomen to help control abnormal heart rhythms. This device uses electrical pulses to prompt the heart to beat at a normal rate. Pacemakers are used to treat heart rhythms that are too slow. Wire (leads) are attached to the pacemaker that goes into the chambers of you heart. This is done in the hospital and usually requires and overnight stay. Please see the instruction sheet given to you today for more information.  Your physician recommends that you return for lab work in: 5-7 days prior to the procedure.

## 2014-09-26 NOTE — Progress Notes (Signed)
Patient ID: [name] GAHM, female   DOB: March 25, 1941, 73 y.o.   MRN: [id number]      Reason for office visit atrial fibrillation, sinus pauses on loop recorder  Mrs. Tera Mater has a long-standing history of palpitations. Earlier this year she was hospitalized for TIA (transient left sided hemiparesis, facial numbness and ataxia). Her implantable loop recorder showed evidence of paroxysmal atrial fibrillation and she is now being treated with anticoagulants. Since May, the burden of atrial fibrillation has been around 3%. The longest episode lasted for roughly 40 hours.  A week ago her loop recorder showed evidence of 2 episodes of significant sinus node arrest, both during the early hours of October 17. One lasted for 3 seconds, the other for 4 seconds. Both occurred after the termination of atrial fibrillation and were asymptomatic (she was probably still in bed with both episodes). Preceding the sinus node pauses she had had a roughly 15 hour episode of paroxysmal atrial fibrillation and she felt very poorly. She remembers feeling unusually short of breath carrying light luggage during the trip to the beach. She also complains of constant fatigue and sluggishness. She has "no pep". She is currently not taking any beta blocker and is only on a very low-dose of digoxin. Ventricular rate control was fair during the episode of atrial fibrillation with average ventricular rate of 96 bpm, but with peak ventricular rates in the 160s.  He has not had any new neurological events and denies any bleeding complications on Eliquis. (Xarelto is also on her list of meds, but she is not taking it).  She has normal left ventricular systolic function and a left atrial want the upper limit of normal in size. There are no significant valvular abnormalities. No angina. Normal nuclear stress test in 2009.   Allergies  Allergen Reactions  . Caffeine Palpitations    Current Outpatient Prescriptions  Medication Sig  Dispense Refill  . apixaban (ELIQUIS) 5 MG TABS tablet Take 1 tablet (5 mg total) by mouth 2 (two) times daily.  180 tablet  3  . Biotin 5000 MCG CAPS Take 1 capsule by mouth daily.      . Calcium Carbonate-Vit D-Min (CALCIUM 1200 PO) Take 2 tablets by mouth 2 (two) times daily.       . cholecalciferol (VITAMIN D) 1000 UNITS tablet Take 2,000 Units by mouth daily.      . ciprofloxacin (CIPRO) 250 MG tablet Take 250 mg by mouth 2 (two) times daily as needed (bladder infection).      Marland Kitchen Co-Enzyme Q10 100 MG CAPS Take 300 mg by mouth daily.      . diazepam (VALIUM) 5 MG tablet Take 5 mg by mouth daily as needed for anxiety.       . digoxin (LANOXIN) 0.125 MG tablet Take 1 tablet (0.125 mg total) by mouth daily.  30 tablet  11  . estradiol (ESTRACE) 0.1 MG/GM vaginal cream Place 1 Applicatorful vaginally 3 (three) times a week.       . folic acid (FOLVITE) 664 MCG tablet Take 400 mcg by mouth daily.      . Glucosamine-Chondroit-Vit C-Mn (GLUCOSAMINE CHONDR 1500 COMPLX PO) Take 2 tablets by mouth daily.      Marland Kitchen MAGNESIUM CITRATE PO Take by mouth. 1 tab daily      . meclizine (ANTIVERT) 25 MG tablet Take 25 mg by mouth as needed for dizziness.       . NON FORMULARY All  cleor      .  RESVERATROL PO Take 1 capsule by mouth daily.      . rivaroxaban (XARELTO) 20 MG TABS tablet Take 1 tablet (20 mg total) by mouth daily with supper.  30 tablet  5  . sertraline (ZOLOFT) 50 MG tablet Take 50 mg by mouth daily.       . vitamin E (VITAMIN E) 400 UNIT capsule Take 400 Units by mouth daily.       No current facility-administered medications for this visit.    Past Medical History  Diagnosis Date  . Hypertension   . Arthritis   . MVP (mitral valve prolapse)   . PVC's (premature ventricular contractions)   . Obesity     Past Surgical History  Procedure Laterality Date  . Excision vaginal cyst      benign  . Tubal ligation    . US echocardiography  05/15/2010    LA mildly dilated,mild mitral annular  ca+, AOV mildly sclerotic, mild asymmetric LVH  . Nm myocar perf wall motion  12/18/2007    normal    Family History  Problem Relation Age of Onset  . Heart failure Mother   . Stroke Father   . Heart failure Brother     History   Social History  . Marital Status: Married    Spouse Name: N/A    Number of Children: N/A  . Years of Education: N/A   Occupational History  . Not on file.   Social History Main Topics  . Smoking status: Former Smoker -- 2.00 packs/day for 18 years    Types: Cigarettes  . Smokeless tobacco: Never Used  . Alcohol Use: Yes  . Drug Use: No  . Sexual Activity: Not on file   Other Topics Concern  . Not on file   Social History Narrative  . No narrative on file    Review of systems: Fatigue, shortness of breath on exertion during atrial fibrillation, no dyspnea at rest, no syncope or presyncope. The patient specifically denies any chest pain at rest or with exertion, orthopnea, paroxysmal nocturnal dyspnea, new focal neurological deficits, intermittent claudication, lower extremity edema, unexplained weight gain, cough, hemoptysis or wheezing.  The patient also denies abdominal pain, nausea, vomiting, dysphagia, diarrhea, constipation, polyuria, polydipsia, dysuria, hematuria, frequency, urgency, abnormal bleeding or bruising, fever, chills, unexpected weight changes, mood swings, change in skin or hair texture, change in voice quality, auditory or visual problems, allergic reactions or rashes, new musculoskeletal complaints other than usual "aches and pains".   PHYSICAL EXAM BP 112/66  Pulse 62  Resp 16  Ht _0  (1.702 m)  Wt 209 lb (94.802 kg)  BMI 32.73 kg/m2 General: Alert, oriented x3, no distress  Head: no evidence of trauma, PERRL, EOMI, no exophtalmos or lid lag, no myxedema, no xanthelasma; normal ears, nose and oropharynx  Neck: normal jugular venous pulsations and no hepatojugular reflux; brisk carotid pulses without delay and no  carotid bruits  Chest: clear to auscultation, no signs of consolidation by percussion or palpation, normal fremitus, symmetrical and full respiratory excursions ; healthy loop recorder site Cardiovascular: normal position and quality of the apical impulse, regular rhythm, normal first and second heart sounds, no murmurs, rubs or gallops  Abdomen: no tenderness or distention, no masses by palpation, no abnormal pulsatility or arterial bruits, normal bowel sounds, no hepatosplenomegaly  Extremities: no clubbing, cyanosis or edema; 2+ radial, ulnar and brachial pulses bilaterally; 2+ right femoral, posterior tibial and dorsalis pedis pulses; 2+ left femoral, posterior tibial and dorsalis  pedis pulses; no subclavian or femoral bruits  Neurological: grossly nonfocal   EKG: NSR  Lipid Panel     Component Value Date/Time   CHOL 152 03/09/2014 0440   TRIG 87 03/09/2014 0440   HDL 58 03/09/2014 0440   CHOLHDL 2.6 03/09/2014 0440   VLDL 17 03/09/2014 0440   LDLCALC 77 03/09/2014 0440    BMET    Component Value Date/Time   NA 138 06/06/2014 1653   K 4.4 06/06/2014 1653   CL 101 06/06/2014 1653   CO2 28 06/06/2014 1653   GLUCOSE 95 06/06/2014 1653   BUN 15 06/06/2014 1653   CREATININE 1.00 06/06/2014 1653   CREATININE 0.87 03/09/2014 0440   CALCIUM 9.6 06/06/2014 1653   GFRNONAA 56* 06/06/2014 1653   GFRNONAA 65* 03/09/2014 0440   GFRAA 65 06/06/2014 1653   GFRAA 75* 03/09/2014 0440     ASSESSMENT AND PLAN Tachy-brady syndrome Jalayia 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 We discussed options for therapy including implantation of a pacemaker or referral for radiofrequency ablation. While a successful ablation might prevent atrial fibrillation and thus prevents the postconversion pauses, it will have no significant impact on her fatigue which is probably due to chronotropic incompetence.  We discussed pacemaker  implantation in detail. Reviewed the potential procedural complications as well as the risks and benefits of long-term pacemaker therapy. She understands and agrees to proceed. She will stop her anticoagulant 24 hours before the procedure and resume it as soon as we are done. The loop recorder will be explanted at the time of the pacemaker implantation.  TIA (transient ischemic attack) Retrospectively, this is most likely secondary to atrial fibrillation. Keep interruption of anticoagulation to a minimum.  PAF (paroxysmal atrial fibrillation) Once the pacemaker is in will review the need for rate control versus rhythm control medications. She understands that if a pacemaker is implanted, radiofrequency ablation cannot be performed for a lengthy period of time, until the leads mature.   Orders Placed This Encounter  Procedures  . CBC  . Comprehensive metabolic panel  . APTT  . Protime-INR  . EKG 12-Lead  . PERMANENT PACEMAKER INSERTION   Meds ordered this encounter  Medications  . ciprofloxacin (CIPRO) 250 MG tablet    Sig: Take 250 mg by mouth 2 (two) times daily as needed (bladder infection).    Holli Humbles, MD, [place] [phone] office [phone] pager

## 2014-09-26 NOTE — Assessment & Plan Note (Signed)
Once the pacemaker is in will review the need for rate control versus rhythm control medications. She understands that if a pacemaker is implanted, radiofrequency ablation cannot be performed for a lengthy period of time, until the leads mature.

## 2014-09-26 NOTE — Assessment & Plan Note (Signed)
Excellent control without medications.

## 2014-09-26 NOTE — Assessment & Plan Note (Signed)
Retrospectively, this is most likely secondary to atrial fibrillation. Keep interruption of anticoagulation to a minimum.

## 2014-09-27 ENCOUNTER — Encounter (HOSPITAL_COMMUNITY): Payer: Self-pay | Admitting: Pharmacy Technician

## 2014-09-28 ENCOUNTER — Other Ambulatory Visit: Payer: Self-pay | Admitting: Cardiovascular Disease

## 2014-09-28 DIAGNOSIS — Z79899 Other long term (current) drug therapy: Secondary | ICD-10-CM | POA: Diagnosis not present

## 2014-09-28 DIAGNOSIS — Z7901 Long term (current) use of anticoagulants: Secondary | ICD-10-CM | POA: Diagnosis not present

## 2014-09-28 DIAGNOSIS — R5381 Other malaise: Secondary | ICD-10-CM | POA: Diagnosis not present

## 2014-09-28 LAB — CBC
HEMATOCRIT: 44.5 % (ref 36.0–46.0)
Hemoglobin: 14.7 g/dL (ref 12.0–15.0)
MCH: 29 pg (ref 26.0–34.0)
MCHC: 33 g/dL (ref 30.0–36.0)
MCV: 87.8 fL (ref 78.0–100.0)
Platelets: 252 10*3/uL (ref 150–400)
RBC: 5.07 MIL/uL (ref 3.87–5.11)
RDW: 12.7 % (ref 11.5–15.5)
WBC: 8.4 10*3/uL (ref 4.0–10.5)

## 2014-09-28 LAB — COMPREHENSIVE METABOLIC PANEL
ALT: 15 U/L (ref 0–35)
AST: 19 U/L (ref 0–37)
Albumin: 3.9 g/dL (ref 3.5–5.2)
Alkaline Phosphatase: 72 U/L (ref 39–117)
BUN: 18 mg/dL (ref 6–23)
CO2: 25 mEq/L (ref 19–32)
CREATININE: 0.86 mg/dL (ref 0.50–1.10)
Calcium: 8.9 mg/dL (ref 8.4–10.5)
Chloride: 103 mEq/L (ref 96–112)
Glucose, Bld: 82 mg/dL (ref 70–99)
Potassium: 4.6 mEq/L (ref 3.5–5.3)
Sodium: 139 mEq/L (ref 135–145)
Total Bilirubin: 0.4 mg/dL (ref 0.2–1.2)
Total Protein: 6.8 g/dL (ref 6.0–8.3)

## 2014-09-28 LAB — APTT: aPTT: 33 seconds (ref 24–37)

## 2014-09-28 LAB — PROTIME-INR
INR: 1.11 (ref <1.50)
Prothrombin Time: 14.3 seconds (ref 11.6–15.2)

## 2014-09-29 ENCOUNTER — Encounter: Payer: Self-pay | Admitting: Cardiovascular Disease

## 2014-09-29 DIAGNOSIS — Z23 Encounter for immunization: Secondary | ICD-10-CM | POA: Diagnosis not present

## 2014-09-29 LAB — MDC_IDC_ENUM_SESS_TYPE_REMOTE: MDC IDC SESS DTM: 20151022040500

## 2014-09-30 ENCOUNTER — Other Ambulatory Visit: Payer: Self-pay | Admitting: *Deleted

## 2014-09-30 ENCOUNTER — Other Ambulatory Visit: Payer: Self-pay

## 2014-09-30 DIAGNOSIS — I495 Sick sinus syndrome: Secondary | ICD-10-CM

## 2014-09-30 NOTE — Progress Notes (Signed)
Loop recorder 

## 2014-10-02 DIAGNOSIS — I455 Other specified heart block: Secondary | ICD-10-CM

## 2014-10-02 DIAGNOSIS — I495 Sick sinus syndrome: Secondary | ICD-10-CM

## 2014-10-02 HISTORY — DX: Sick sinus syndrome: I49.5

## 2014-10-02 HISTORY — DX: Other specified heart block: I45.5

## 2014-10-03 ENCOUNTER — Telehealth: Payer: Self-pay | Admitting: Cardiovascular Disease

## 2014-10-03 DIAGNOSIS — I480 Paroxysmal atrial fibrillation: Secondary | ICD-10-CM | POA: Diagnosis not present

## 2014-10-03 DIAGNOSIS — Z79899 Other long term (current) drug therapy: Secondary | ICD-10-CM | POA: Diagnosis not present

## 2014-10-03 DIAGNOSIS — E669 Obesity, unspecified: Secondary | ICD-10-CM | POA: Diagnosis not present

## 2014-10-03 DIAGNOSIS — Z7901 Long term (current) use of anticoagulants: Secondary | ICD-10-CM | POA: Diagnosis not present

## 2014-10-03 DIAGNOSIS — T50905A Adverse effect of unspecified drugs, medicaments and biological substances, initial encounter: Secondary | ICD-10-CM | POA: Diagnosis not present

## 2014-10-03 DIAGNOSIS — I10 Essential (primary) hypertension: Secondary | ICD-10-CM | POA: Diagnosis not present

## 2014-10-03 DIAGNOSIS — I455 Other specified heart block: Secondary | ICD-10-CM | POA: Diagnosis not present

## 2014-10-03 DIAGNOSIS — Z8673 Personal history of transient ischemic attack (TIA), and cerebral infarction without residual deficits: Secondary | ICD-10-CM | POA: Diagnosis not present

## 2014-10-03 DIAGNOSIS — I495 Sick sinus syndrome: Secondary | ICD-10-CM | POA: Diagnosis not present

## 2014-10-03 DIAGNOSIS — R21 Rash and other nonspecific skin eruption: Secondary | ICD-10-CM | POA: Diagnosis not present

## 2014-10-03 DIAGNOSIS — Z87891 Personal history of nicotine dependence: Secondary | ICD-10-CM | POA: Diagnosis not present

## 2014-10-03 DIAGNOSIS — R001 Bradycardia, unspecified: Secondary | ICD-10-CM | POA: Diagnosis not present

## 2014-10-03 DIAGNOSIS — Z6832 Body mass index (BMI) 32.0-32.9, adult: Secondary | ICD-10-CM | POA: Diagnosis not present

## 2014-10-03 MED ORDER — SODIUM CHLORIDE 0.9 % IJ SOLN: 3.0000 mL | INTRAMUSCULAR | Status: DC | PRN

## 2014-10-03 MED ORDER — CEFAZOLIN SODIUM-DEXTROSE 2-3 GM-% IV SOLR: 2.0000 g | INTRAVENOUS | Status: DC

## 2014-10-03 MED ORDER — MUPIROCIN 2 % EX OINT
1.0000 "application " | TOPICAL_OINTMENT | Freq: Once | CUTANEOUS | Status: AC
  Administered 2014-10-04: 1 via TOPICAL
  Filled 2014-10-03: qty 22

## 2014-10-03 MED ORDER — GENTAMICIN SULFATE 40 MG/ML IJ SOLN
80.0000 mg | INTRAMUSCULAR | Status: DC
  Filled 2014-10-03: qty 2

## 2014-10-03 MED ORDER — SODIUM CHLORIDE 0.9 % IV SOLN
INTRAVENOUS | Status: DC
  Administered 2014-10-04: 15:00:00 via INTRAVENOUS

## 2014-10-03 NOTE — Telephone Encounter (Signed)
Pt called in stating that she is going to have surgery tomorrow and was not told if she needed to come off her Eliquis prior to surgery. Please call  Thanks

## 2014-10-03 NOTE — Telephone Encounter (Signed)
Answering machine.

## 2014-10-03 NOTE — Telephone Encounter (Signed)
Returned call to patient [name] [name] already told patient about holding eliquis.Patient stated her PM insertion will be at 3:00 pm tomorrow wanting to know if she can eat breakfast.Spoke to Dr.Croitoru he advised nothing to eat or drink 6 hours before procedure.

## 2014-10-04 ENCOUNTER — Encounter (HOSPITAL_COMMUNITY)
Admission: RE | Disposition: A | Discharge status: Home / Self Care | Payer: Self-pay | Source: Ambulatory Visit | Attending: Cardiovascular Disease

## 2014-10-04 ENCOUNTER — Ambulatory Visit (HOSPITAL_COMMUNITY)
Admission: RE | Admit: 2014-10-04 | Discharge: 2014-10-05 | Disposition: A | Discharge status: Home / Self Care | Payer: Medicare Other | Source: Ambulatory Visit | Attending: Cardiovascular Disease | Admitting: Cardiovascular Disease

## 2014-10-04 ENCOUNTER — Encounter (HOSPITAL_COMMUNITY): Payer: Self-pay | Admitting: General Practice

## 2014-10-04 DIAGNOSIS — Z6832 Body mass index (BMI) 32.0-32.9, adult: Secondary | ICD-10-CM | POA: Insufficient documentation

## 2014-10-04 DIAGNOSIS — Z79899 Other long term (current) drug therapy: Secondary | ICD-10-CM | POA: Insufficient documentation

## 2014-10-04 DIAGNOSIS — R001 Bradycardia, unspecified: Secondary | ICD-10-CM | POA: Insufficient documentation

## 2014-10-04 DIAGNOSIS — Z950 Presence of cardiac pacemaker: Secondary | ICD-10-CM | POA: Diagnosis present

## 2014-10-04 DIAGNOSIS — Z7901 Long term (current) use of anticoagulants: Secondary | ICD-10-CM

## 2014-10-04 DIAGNOSIS — E669 Obesity, unspecified: Secondary | ICD-10-CM | POA: Insufficient documentation

## 2014-10-04 DIAGNOSIS — T50905A Adverse effect of unspecified drugs, medicaments and biological substances, initial encounter: Secondary | ICD-10-CM | POA: Insufficient documentation

## 2014-10-04 DIAGNOSIS — I495 Sick sinus syndrome: Secondary | ICD-10-CM | POA: Diagnosis not present

## 2014-10-04 DIAGNOSIS — R21 Rash and other nonspecific skin eruption: Secondary | ICD-10-CM | POA: Insufficient documentation

## 2014-10-04 DIAGNOSIS — I480 Paroxysmal atrial fibrillation: Secondary | ICD-10-CM | POA: Insufficient documentation

## 2014-10-04 DIAGNOSIS — I455 Other specified heart block: Secondary | ICD-10-CM | POA: Insufficient documentation

## 2014-10-04 DIAGNOSIS — Z87891 Personal history of nicotine dependence: Secondary | ICD-10-CM | POA: Insufficient documentation

## 2014-10-04 DIAGNOSIS — Z8673 Personal history of transient ischemic attack (TIA), and cerebral infarction without residual deficits: Secondary | ICD-10-CM | POA: Insufficient documentation

## 2014-10-04 DIAGNOSIS — G459 Transient cerebral ischemic attack, unspecified: Secondary | ICD-10-CM | POA: Diagnosis present

## 2014-10-04 DIAGNOSIS — I10 Essential (primary) hypertension: Secondary | ICD-10-CM | POA: Insufficient documentation

## 2014-10-04 HISTORY — DX: Adverse effect of unspecified anesthetic, initial encounter: T41.45XA

## 2014-10-04 HISTORY — DX: Other specified heart block: I45.5

## 2014-10-04 HISTORY — DX: Cerebral infarction, unspecified: I63.9

## 2014-10-04 HISTORY — DX: Other complications of anesthesia, initial encounter: T88.59XA

## 2014-10-04 HISTORY — DX: Unspecified atrial fibrillation: I48.91

## 2014-10-04 HISTORY — PX: INSERT / REPLACE / REMOVE PACEMAKER: SUR710

## 2014-10-04 HISTORY — DX: Urinary tract infection, site not specified: N39.0

## 2014-10-04 HISTORY — DX: Gastro-esophageal reflux disease without esophagitis: K21.9

## 2014-10-04 HISTORY — PX: LOOP RECORDER EXPLANT: SHX5476

## 2014-10-04 HISTORY — PX: PERMANENT PACEMAKER INSERTION: SHX5480

## 2014-10-04 HISTORY — DX: Sick sinus syndrome: I49.5

## 2014-10-04 HISTORY — DX: Presence of cardiac pacemaker: Z95.0

## 2014-10-04 LAB — MDC_IDC_ENUM_SESS_TYPE_INCLINIC

## 2014-10-04 LAB — URINE MICROSCOPIC-ADD ON

## 2014-10-04 LAB — SURGICAL PCR SCREEN
MRSA, PCR: NEGATIVE
Staphylococcus aureus: NEGATIVE

## 2014-10-04 LAB — URINALYSIS, ROUTINE W REFLEX MICROSCOPIC
Bilirubin Urine: NEGATIVE
GLUCOSE, UA: NEGATIVE mg/dL
HGB URINE DIPSTICK: NEGATIVE
Ketones, ur: NEGATIVE mg/dL
Nitrite: NEGATIVE
Protein, ur: NEGATIVE mg/dL
Specific Gravity, Urine: 1.013 (ref 1.005–1.030)
Urobilinogen, UA: 0.2 mg/dL (ref 0.0–1.0)
pH: 6 (ref 5.0–8.0)

## 2014-10-04 SURGERY — LOOP RECORDER EXPLANT
Anesthesia: LOCAL

## 2014-10-04 MED ORDER — HYDROCODONE-ACETAMINOPHEN 5-325 MG PO TABS: 1.0000 | ORAL_TABLET | ORAL | Status: DC | PRN

## 2014-10-04 MED ORDER — ACETAMINOPHEN 325 MG PO TABS
325.0000 mg | ORAL_TABLET | ORAL | Status: DC | PRN
  Administered 2014-10-05: 650 mg via ORAL
  Filled 2014-10-04: qty 2

## 2014-10-04 MED ORDER — LIDOCAINE HCL (PF) 1 % IJ SOLN
INTRAMUSCULAR | Status: AC
  Filled 2014-10-04: qty 30

## 2014-10-04 MED ORDER — LIDOCAINE HCL (PF) 1 % IJ SOLN
INTRAMUSCULAR | Status: AC
  Filled 2014-10-04: qty 60

## 2014-10-04 MED ORDER — YOU HAVE A PACEMAKER BOOK
Freq: Once | Status: DC
  Filled 2014-10-04: qty 1

## 2014-10-04 MED ORDER — DIAZEPAM 5 MG PO TABS
5.0000 mg | ORAL_TABLET | Freq: Every day | ORAL | Status: DC | PRN
  Administered 2014-10-04: 22:00:00 5 mg via ORAL
  Filled 2014-10-04: qty 1

## 2014-10-04 MED ORDER — CEFAZOLIN SODIUM-DEXTROSE 2-3 GM-% IV SOLR
INTRAVENOUS | Status: AC
  Filled 2014-10-04: qty 50

## 2014-10-04 MED ORDER — FENTANYL CITRATE 0.05 MG/ML IJ SOLN
INTRAMUSCULAR | Status: AC
  Filled 2014-10-04: qty 2

## 2014-10-04 MED ORDER — DIGOXIN 125 MCG PO TABS
0.1250 mg | ORAL_TABLET | Freq: Every day | ORAL | Status: DC
  Administered 2014-10-05: 11:00:00 0.125 mg via ORAL
  Filled 2014-10-04 (×2): qty 1

## 2014-10-04 MED ORDER — MIDAZOLAM HCL 5 MG/5ML IJ SOLN
INTRAMUSCULAR | Status: AC
  Filled 2014-10-04: qty 5

## 2014-10-04 MED ORDER — SODIUM CHLORIDE 0.9 % IV SOLN: INTRAVENOUS | Status: DC

## 2014-10-04 MED ORDER — SERTRALINE HCL 50 MG PO TABS
50.0000 mg | ORAL_TABLET | Freq: Every day | ORAL | Status: DC
  Administered 2014-10-05: 11:00:00 50 mg via ORAL
  Filled 2014-10-04 (×2): qty 1

## 2014-10-04 MED ORDER — SIMETHICONE 80 MG PO CHEW
160.0000 mg | CHEWABLE_TABLET | Freq: Four times a day (QID) | ORAL | Status: DC | PRN
  Filled 2014-10-04: qty 2

## 2014-10-04 MED ORDER — HEPARIN (PORCINE) IN NACL 2-0.9 UNIT/ML-% IJ SOLN
INTRAMUSCULAR | Status: AC
  Filled 2014-10-04: qty 500

## 2014-10-04 MED ORDER — CEFAZOLIN SODIUM 1-5 GM-% IV SOLN
1.0000 g | Freq: Four times a day (QID) | INTRAVENOUS | Status: DC
  Administered 2014-10-04 – 2014-10-05 (×2): 1 g via INTRAVENOUS
  Filled 2014-10-04 (×4): qty 50

## 2014-10-04 MED ORDER — MUPIROCIN 2 % EX OINT
TOPICAL_OINTMENT | CUTANEOUS | Status: AC
  Administered 2014-10-04: 1 via TOPICAL
  Filled 2014-10-04: qty 22

## 2014-10-04 MED ORDER — ONDANSETRON HCL 4 MG/2ML IJ SOLN: 4.0000 mg | Freq: Four times a day (QID) | INTRAMUSCULAR | Status: DC | PRN

## 2014-10-04 NOTE — Progress Notes (Signed)
Patient had been taking Bactrim for UTI, but not sure if she took it long enough (does not recall start date). Check UA to see if still necessary - I suspect not.

## 2014-10-04 NOTE — Plan of Care (Signed)
Problem: Consults Goal: Permanent Pacemaker Patient Education (See Patient Education module for education specifics.) Outcome: Completed/Met Date Met:  10/04/14

## 2014-10-04 NOTE — Interval H&P Note (Signed)
History and Physical Interval Note:  10/04/2014 1:12 PM  [name] [name]  has presented today for surgery, with the diagnosis of bradycardia  The various methods of treatment have been discussed with the patient and family. After consideration of risks, benefits and other options for treatment, the patient has consented to  Procedure(s): LOOP RECORDER EXPLANT (N/A) PERMANENT PACEMAKER INSERTION (N/A) as a surgical intervention .  The patient's history has been reviewed, patient examined, no change in status, stable for surgery.  I have reviewed the patient's chart and labs.  Questions were answered to the patient's satisfaction.     [name] [name]

## 2014-10-04 NOTE — Op Note (Signed)
Procedure report  Procedure performed:  1. Implantation of new dual chamber permanent pacemaker 2. Fluoroscopy 3. Light sedation 4. Loop recorder explantation  Reason for procedure: Symptomatic bradycardia due to: Sinus arrest Tachycardia-bradycardia syndrome Bradycardia due to necessary medications  Procedure performed by: Sanda Klein, MD  Complications: None  Estimated blood loss: <10 mL  Medications administered during procedure: Ancef 2g intravenously Lidocaine 1% 30 mL locally,  Fentanyl 100 mcg intravenously Versed 4 mg intravenously  Device details: Generator Medtronic Advisa model [id number] serial number [id number] H Right atrial lead Medtronic [id number] serial number [id number] Right ventricular lead Medtronic [id number] serial number [id number]  Procedure details:  After the risks and benefits of the procedure were discussed the patient provided informed consent and was brought to the cardiac cath lab in the fasting state. The patient was prepped and draped in usual sterile fashion. Local anesthesia with 1% lidocaine was administered to to the left infraclavicular area. A 5-6 cm horizontal incision was made parallel with and 2-3 cm caudal to the left clavicle. Using electrocautery and blunt dissection a prepectoral pocket was created down to the level of the pectoralis major muscle fascia. The pocket was carefully inspected for hemostasis. An antibiotic-soaked sponge was placed in the pocket.  Under fluoroscopic guidance and using the modified Seldinger technique a single venipunctures was performed to access the left subclavian vein. No difficulty was encountered accessing the vein. Two J-tipped guidewires were placed through the sheath and subsequently exchanged for two 7 French safe sheaths.  Under fluoroscopic guidance the ventricular lead was advanced to level of the mid to apical right ventricular septum and thet active-fixation helix was deployed. Prominent  current of injury was seen. Satisfactory pacing and sensing parameters were recorded. There was no evidence of diaphragmatic stimulation at maximum device output. The safe sheath was peeled away and the lead was secured in place with 2-0 silk.  In similar fashion the right atrial lead was advanced to the level of the atrial appendage. The active-fixation helix was deployed. There was prominent current of injury. Satisfactory  pacing and sensing parameters were recorded. There was no evidence of diaphragmatic stimulation with pacing at maximum device output. The safe sheath was peeled away and the lead was secured in place with 2-0 silk.  The antibiotic-soaked sponge was removed from the pocket. The pocket was flushed with copious amounts of antibiotic solution. Reinspection showed excellent hemostasis..  The ventricular lead was connected to the generator and appropriate ventricular pacing was seen. Subsequently the atrial lead was also connected. Repeat testing of the lead parameters later showed excellent values.  The entire system was then carefully inserted in the pocket with care been taking that the leads and device assumed a comfortable position without pressure on the incision. Great care was taken that the leads be located deep to the generator. The pocket was then closed in layers using 2 layers of 2-0 Vicryl and cutaneous staples, after which a sterile dressing was applied.  At the end of the procedure the following lead parameters were encountered:  Right atrial lead  sensed P waves 3.4 mV, impedance 925ohms, threshold 0.7 V at 0.5 ms pulse width.  Right ventricular lead sensed R waves 7.1 mV, impedance 1088ohms, threshold 0.5 V at 0.5 ms pulse width.  Sanda Klein, MD, [facility] [phone] office [phone] pager

## 2014-10-04 NOTE — H&P (View-Only) (Signed)
[name], female   DOB: 02/02/1941, 72 y.o.   MRN: [id number]      Reason for office visit atrial fibrillation, sinus pauses on loop recorder  [name] [name] has a long-standing history of palpitations. Earlier this year she was hospitalized for TIA (transient left sided hemiparesis, facial numbness and ataxia). Her implantable loop recorder showed evidence of paroxysmal atrial fibrillation and she is now being treated with anticoagulants. Since May, the burden of atrial fibrillation has been around 3%. The longest episode lasted for roughly 40 hours.  A week ago her loop recorder showed evidence of 2 episodes of significant sinus node arrest, both during the early hours of October 17. One lasted for 3 seconds, the other for 4 seconds. Both occurred after the termination of atrial fibrillation and were asymptomatic (she was probably still in bed with both episodes). Preceding the sinus node pauses she had had a roughly 15 hour episode of paroxysmal atrial fibrillation and she felt very poorly. She remembers feeling unusually short of breath carrying light luggage during the trip to the beach. She also complains of constant fatigue and sluggishness. She has "no pep". She is currently not taking any beta blocker and is only on a very low-dose of digoxin. Ventricular rate control was fair during the episode of atrial fibrillation with average ventricular rate of 96 bpm, but with peak ventricular rates in the 160s.  He has not had any new neurological events and denies any bleeding complications on Eliquis. (Xarelto is also on her list of meds, but she is not taking it).  She has normal left ventricular systolic function and a left atrial want the upper limit of normal in size. There are no significant valvular abnormalities. No angina. Normal nuclear stress test in 2009.   Allergies  Allergen Reactions  . Caffeine Palpitations    Current Outpatient Prescriptions  Medication Sig  Dispense Refill  . apixaban (ELIQUIS) 5 MG TABS tablet Take 1 tablet (5 mg total) by mouth 2 (two) times daily.  180 tablet  3  . Biotin 5000 MCG CAPS Take 1 capsule by mouth daily.      . Calcium Carbonate-Vit D-Min (CALCIUM 1200 PO) Take 2 tablets by mouth 2 (two) times daily.       . cholecalciferol (VITAMIN D) 1000 UNITS tablet Take 2,000 Units by mouth daily.      . ciprofloxacin (CIPRO) 250 MG tablet Take 250 mg by mouth 2 (two) times daily as needed (bladder infection).      . Co-Enzyme Q10 100 MG CAPS Take 300 mg by mouth daily.      . diazepam (VALIUM) 5 MG tablet Take 5 mg by mouth daily as needed for anxiety.       . digoxin (LANOXIN) 0.125 MG tablet Take 1 tablet (0.125 mg total) by mouth daily.  30 tablet  11  . estradiol (ESTRACE) 0.1 MG/GM vaginal cream Place 1 Applicatorful vaginally 3 (three) times a week.       . folic acid (FOLVITE) 400 MCG tablet Take 400 mcg by mouth daily.      . Glucosamine-Chondroit-Vit C-Mn (GLUCOSAMINE CHONDR 1500 COMPLX PO) Take 2 tablets by mouth daily.      . MAGNESIUM CITRATE PO Take by mouth. 1 tab daily      . meclizine (ANTIVERT) 25 MG tablet Take 25 mg by mouth as needed for dizziness.       . NON FORMULARY All  cleor      .   RESVERATROL PO Take 1 capsule by mouth daily.      . rivaroxaban (XARELTO) 20 MG TABS tablet Take 1 tablet (20 mg total) by mouth daily with supper.  30 tablet  5  . sertraline (ZOLOFT) 50 MG tablet Take 50 mg by mouth daily.       . vitamin E (VITAMIN E) 400 UNIT capsule Take 400 Units by mouth daily.       No current facility-administered medications for this visit.    Past Medical History  Diagnosis Date  . Hypertension   . Arthritis   . MVP (mitral valve prolapse)   . PVC's (premature ventricular contractions)   . Obesity     Past Surgical History  Procedure Laterality Date  . Excision vaginal cyst      benign  . Tubal ligation    . Us echocardiography  05/15/2010    LA mildly dilated,mild mitral annular  ca+, AOV mildly sclerotic, mild asymmetric LVH  . Nm myocar perf wall motion  12/18/2007    normal    Family History  Problem Relation Age of Onset  . Heart failure Mother   . Stroke Father   . Heart failure Brother     History   Social History  . Marital Status: Married    Spouse Name: N/A    Number of Children: N/A  . Years of Education: N/A   Occupational History  . Not on file.   Social History Main Topics  . Smoking status: Former Smoker -- 2.00 packs/day for 18 years    Types: Cigarettes  . Smokeless tobacco: Never Used  . Alcohol Use: Yes  . Drug Use: No  . Sexual Activity: Not on file   Other Topics Concern  . Not on file   Social History Narrative  . No narrative on file    Review of systems: Fatigue, shortness of breath on exertion during atrial fibrillation, no dyspnea at rest, no syncope or presyncope. The patient specifically denies any chest pain at rest or with exertion, orthopnea, paroxysmal nocturnal dyspnea, new focal neurological deficits, intermittent claudication, lower extremity edema, unexplained weight gain, cough, hemoptysis or wheezing.  The patient also denies abdominal pain, nausea, vomiting, dysphagia, diarrhea, constipation, polyuria, polydipsia, dysuria, hematuria, frequency, urgency, abnormal bleeding or bruising, fever, chills, unexpected weight changes, mood swings, change in skin or hair texture, change in voice quality, auditory or visual problems, allergic reactions or rashes, new musculoskeletal complaints other than usual "aches and pains".   PHYSICAL EXAM BP 112/66  Pulse 62  Resp 16  Ht 5' 7" (1.702 m)  Wt 209 lb (94.802 kg)  BMI 32.73 kg/m2 General: Alert, oriented x3, no distress  Head: no evidence of trauma, PERRL, EOMI, no exophtalmos or lid lag, no myxedema, no xanthelasma; normal ears, nose and oropharynx  Neck: normal jugular venous pulsations and no hepatojugular reflux; brisk carotid pulses without delay and no  carotid bruits  Chest: clear to auscultation, no signs of consolidation by percussion or palpation, normal fremitus, symmetrical and full respiratory excursions ; healthy loop recorder site Cardiovascular: normal position and quality of the apical impulse, regular rhythm, normal first and second heart sounds, no murmurs, rubs or gallops  Abdomen: no tenderness or distention, no masses by palpation, no abnormal pulsatility or arterial bruits, normal bowel sounds, no hepatosplenomegaly  Extremities: no clubbing, cyanosis or edema; 2+ radial, ulnar and brachial pulses bilaterally; 2+ right femoral, posterior tibial and dorsalis pedis pulses; 2+ left femoral, posterior tibial and dorsalis   pedis pulses; no subclavian or femoral bruits  Neurological: grossly nonfocal   EKG: NSR  Lipid Panel     Component Value Date/Time   CHOL 152 03/09/2014 0440   TRIG 87 03/09/2014 0440   HDL 58 03/09/2014 0440   CHOLHDL 2.6 03/09/2014 0440   VLDL 17 03/09/2014 0440   LDLCALC 77 03/09/2014 0440    BMET    Component Value Date/Time   NA 138 06/06/2014 1653   K 4.4 06/06/2014 1653   CL 101 06/06/2014 1653   CO2 28 06/06/2014 1653   GLUCOSE 95 06/06/2014 1653   BUN 15 06/06/2014 1653   CREATININE 1.00 06/06/2014 1653   CREATININE 0.87 03/09/2014 0440   CALCIUM 9.6 06/06/2014 1653   GFRNONAA 56* 06/06/2014 1653   GFRNONAA 65* 03/09/2014 0440   GFRAA 65 06/06/2014 1653   GFRAA 75* 03/09/2014 0440     ASSESSMENT AND PLAN Tachy-brady syndrome [name] [name] 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 We discussed options for therapy including implantation of a pacemaker or referral for radiofrequency ablation. While a successful ablation might prevent atrial fibrillation and thus prevents the postconversion pauses, it will have no significant impact on her fatigue which is probably due to chronotropic incompetence.  We discussed pacemaker  implantation in detail. Reviewed the potential procedural complications as well as the risks and benefits of long-term pacemaker therapy. She understands and agrees to proceed. She will stop her anticoagulant 24 hours before the procedure and resume it as soon as we are done. The loop recorder will be explanted at the time of the pacemaker implantation.  TIA (transient ischemic attack) Retrospectively, this is most likely secondary to atrial fibrillation. Keep interruption of anticoagulation to a minimum.  PAF (paroxysmal atrial fibrillation) Once the pacemaker is in will review the need for rate control versus rhythm control medications. She understands that if a pacemaker is implanted, radiofrequency ablation cannot be performed for a lengthy period of time, until the leads mature.   Orders Placed This Encounter  Procedures  . CBC  . Comprehensive metabolic panel  . APTT  . Protime-INR  . EKG 12-Lead  . PERMANENT PACEMAKER INSERTION   Meds ordered this encounter  Medications  . ciprofloxacin (CIPRO) 250 MG tablet    Sig: Take 250 mg by mouth 2 (two) times daily as needed (bladder infection).    [name] [name]  Calina Patrie, MD, FACC [facility] [phone] office [phone] pager   

## 2014-10-04 NOTE — Progress Notes (Signed)
Loop check in clinic (industry checked).  Pt with 0 tachy episodes;0 brady episodes; 2 asystole episodes---max dur. 4 sec; 38 AF episodes---overall burden 2.8% + Eliquis. Plan to follow up with a ppm implant first available.

## 2014-10-04 NOTE — Discharge Instructions (Signed)
Supplemental Discharge Instructions for  Pacemaker/Defibrillator Patients  Activity Do not raise your left/right arm above shoulder level or extend it backward beyond shoulder level for 2 weeks. Wear the arm sling as a reminder or as needed for comfort for 2 weeks. No heavy lifting or vigorous activity with your left/right arm for 6-8 weeks.    NO DRIVING is preferable for 2 weeks; If absolutely necessary, drive only short, familiar routes. DO wear your seatbelt, even if it crosses over the pacemaker site.  WOUND CARE - Keep the wound area clean and dry.  Remove the dressing the day after you return home (usually 48 hours after the procedure). - DO NOT SUBMERGE UNDER WATER UNTIL FULLY HEALED (no tub baths, hot tubs, swimming pools, etc.).  - You  may shower or take a sponge bath after the dressing is removed. DO NOT SOAK the area and do not allow the shower to directly spray on the site. - If you have staples, these will be removed in the office in 7-14 days. - If you have tape/steri-strips on your wound, these will fall off; do not pull them off prematurely.   - No bandage is needed on the site.  DO  NOT apply any creams, oils, or ointments to the wound area. - If you notice any drainage or discharge from the wound, any swelling, excessive redness or bruising at the site, or if you develop a fever > 101? F after you are discharged home, call the office at once.  Special Instructions - You are still able to use cellular telephones.  Avoid carrying your cellular phone near your device. - When traveling through airports, show security personnel your identification card to avoid being screened in the metal detectors.  - Avoid arc welding equipment, MRI testing (magnetic resonance imaging), TENS units (transcutaneous nerve stimulators).  Call the office for questions about other devices. - Avoid electrical appliances that are in poor condition or are not properly grounded. - Microwave ovens are  safe to be near or to operate.  Additional information for defibrillator patients should your device go off: - If your device goes off ONCE and you feel fine afterward, notify the clinic at [phone]. - If your device goes off ONCE and you do not feel well afterward, call 911. - If your device goes off TWICE or more in one day, call 911.  DO NOT DRIVE YOURSELF OR A FAMILY MEMBER WITH A DEFIBRILLATOR TO THE HOSPITAL--CALL 911.

## 2014-10-05 ENCOUNTER — Encounter (HOSPITAL_COMMUNITY): Payer: Self-pay | Admitting: Physician Assistant

## 2014-10-05 ENCOUNTER — Telehealth: Payer: Self-pay | Admitting: Cardiovascular Disease

## 2014-10-05 ENCOUNTER — Ambulatory Visit (HOSPITAL_COMMUNITY): Payer: Medicare Other

## 2014-10-05 DIAGNOSIS — Z7901 Long term (current) use of anticoagulants: Secondary | ICD-10-CM

## 2014-10-05 DIAGNOSIS — T50905A Adverse effect of unspecified drugs, medicaments and biological substances, initial encounter: Secondary | ICD-10-CM | POA: Diagnosis not present

## 2014-10-05 DIAGNOSIS — Z950 Presence of cardiac pacemaker: Secondary | ICD-10-CM | POA: Diagnosis not present

## 2014-10-05 DIAGNOSIS — I455 Other specified heart block: Secondary | ICD-10-CM | POA: Diagnosis not present

## 2014-10-05 DIAGNOSIS — R001 Bradycardia, unspecified: Secondary | ICD-10-CM | POA: Diagnosis not present

## 2014-10-05 DIAGNOSIS — I495 Sick sinus syndrome: Secondary | ICD-10-CM | POA: Diagnosis not present

## 2014-10-05 MED ORDER — DIPHENHYDRAMINE HCL 50 MG PO TABS: 25.0000 mg | ORAL_TABLET | Freq: Four times a day (QID) | ORAL | Status: DC | PRN

## 2014-10-05 MED ORDER — PREDNISONE 20 MG PO TABS: 60.0000 mg | ORAL_TABLET | Freq: Every day | ORAL | Status: DC

## 2014-10-05 MED ORDER — DIPHENHYDRAMINE HCL 50 MG/ML IJ SOLN: 25.0000 mg | Freq: Four times a day (QID) | INTRAMUSCULAR | Status: DC | PRN

## 2014-10-05 MED ORDER — CLOBETASOL PROPIONATE 0.05 % EX OINT
TOPICAL_OINTMENT | Freq: Two times a day (BID) | CUTANEOUS | Status: DC
  Administered 2014-10-05: 11:00:00 via TOPICAL
  Filled 2014-10-05 (×2): qty 15

## 2014-10-05 MED ORDER — APIXABAN 5 MG PO TABS: 5.0000 mg | ORAL_TABLET | Freq: Two times a day (BID) | ORAL | Status: DC

## 2014-10-05 MED ORDER — HYDROCORTISONE 1 % EX LOTN: 1.0000 "application " | TOPICAL_LOTION | Freq: Two times a day (BID) | CUTANEOUS | Status: DC

## 2014-10-05 MED ORDER — DIPHENHYDRAMINE HCL 50 MG/ML IJ SOLN
25.0000 mg | Freq: Four times a day (QID) | INTRAMUSCULAR | Status: DC | PRN
  Administered 2014-10-05: 07:00:00 25 mg via INTRAVENOUS
  Filled 2014-10-05: qty 1

## 2014-10-05 NOTE — Discharge Summary (Signed)
CARDIOLOGY DISCHARGE SUMMARY   [name] MRN: [id number] DOB/AGE: Jul 07, 1941 73 y.o.  Admit date: 10/04/2014 Discharge date: 10/05/2014  PCP: Reginia Naas, MD Primary Cardiologist: Dr. Sallyanne Kuster  Primary Discharge Diagnosis:    Sinus arrest -s/p Medtronic Advisa model [id number] serial number [id number] H - DDDR PPM  Secondary Discharge Diagnosis:    TIA (transient ischemic attack)   Tachy-brady syndrome   Chronic anticoagulation   Pacemaker  Procedure performed: 1. Implantation of new dual chamber permanent pacemaker 2. Fluoroscopy 3. Light sedation 4. Loop recorder explantation 5. Post-PPM CXR  Hospital Course: [name] [name] is a 73 y.o. female with a history of TIA, palpitations, loop recorder implanted--> PAF, now on Eliquis. Sinus arrest also seen, as well as symptomatic bradycardia. Pacemaker was scheduled and she came to the hospital for the procedure on 11/03.  She had a Medtronic dual lead pacemaker implanted without immediate complication.   She received IV and death per pacemaker protocol and initially tolerated this well. However, after the second dose she developed itching and welts. She was started on steroids topically and Benadryl. She felt that her symptoms were well-controlled with these medications. She did not have any swelling or shortness of breath. There was no wheezing noted and no perioral edema.  On 11/04 she was seen by Dr. Debara Pickett and all data were reviewed. Her symptoms were improved by the steroid cream and Benadryl. She was having no other problems with the pacemaker and her post-procedure chest x-ray showed no pneumothorax. No further inpatient workup was indicated and she is considered stable for discharge, to follow up as an outpatient.  Labs:   Lab Results  Component Value Date   WBC 8.4 09/28/2014   HGB 14.7 09/28/2014   HCT 44.5 09/28/2014   MCV 87.8 09/28/2014   PLT 252 09/28/2014    No results for input(s):  NA, K, CL, CO2, BUN, CREATININE, CALCIUM, PROT, BILITOT, ALKPHOS, ALT, AST, GLUCOSE in the last 168 hours.  Invalid input(s): LABALBU    Radiology: Dg Chest 2 View 10/05/2014   CLINICAL DATA:  Status post pacemaker placement  EXAM: CHEST  2 VIEW  COMPARISON:  10/09/2007  FINDINGS: A pacing device is now seen on the left. No pneumothorax is noted. The lungs are well aerated bilaterally with mild interstitial changes stable from the previous study. No acute bony abnormality is seen. The cardiac shadow is within normal limits.  IMPRESSION: No evidence of pneumothorax following pacemaker placement. No acute abnormality is seen.   Electronically Signed   By: Inez Catalina M.D.   On: 10/05/2014 08:12    Op note: 10/04/2014 Device details: Generator Medtronic Advisa model [id number] serial number [id number] H Right atrial lead Medtronic [id number] serial number [id number] Right ventricular lead Medtronic [id number] serial number [id number] Right atrial lead sensed P waves 3.4 mV, impedance 925ohms, threshold 0.7 V at 0.5 ms pulse width. Right ventricular lead sensed R waves 7.1 mV, impedance 1088ohms, threshold 0.5 V at 0.5 ms pulse width.  EKG: 10/05/2014 SR, A pacing, AV pacing, some fusion beats  FOLLOW UP PLANS AND APPOINTMENTS Allergies  Allergen Reactions  . Ancef [Cefazolin] Hives and Itching  . Caffeine Palpitations     Medication List    TAKE these medications        acetaminophen 325 MG tablet  Commonly known as:  TYLENOL  Take 325 mg by mouth daily as needed (pain).     apixaban 5 MG Tabs tablet  Commonly known as:  ELIQUIS  Take 1 tablet (5 mg total) by mouth 2 (two) times daily. HOLD for now, resume with 11/05 PM dose.     BIOFREEZE EX  Apply 1 application topically daily as needed (hip pain).     Biotin 5000 MCG Caps  Take 5,000 mcg by mouth daily.     CALCIUM 1200 PO  Take 2,400 mg by mouth daily.     cholecalciferol 1000 UNITS tablet  Commonly known as:  VITAMIN D    Take 1,000 Units by mouth daily.     ciprofloxacin 250 MG tablet  Commonly known as:  CIPRO  Take 250 mg by mouth See admin instructions. Take 1 tablet (250 mg) twice daily for 3 days as need for bladder infections     Co-Enzyme Q10 100 MG Caps  Take 100 mg by mouth daily.     diazepam 5 MG tablet  Commonly known as:  VALIUM  Take 5 mg by mouth daily as needed for anxiety.     digoxin 0.125 MG tablet  Commonly known as:  LANOXIN  Take 1 tablet (0.125 mg total) by mouth daily.     diphenhydrAMINE 50 MG tablet  Commonly known as:  BENADRYL  Take 0.5-1 tablets (25-50 mg total) by mouth every 6 (six) hours as needed for itching.  Notes to Patient:  New med     estradiol 0.1 MG/GM vaginal cream  Commonly known as:  ESTRACE  Place 1 Applicatorful vaginally 3 (three) times a week.     folic acid 498 MCG tablet  Commonly known as:  FOLVITE  Take 400 mcg by mouth daily.     GAS-X PO  Take 2 tablets by mouth daily as needed (bloating).     GLUCOSAMINE CHONDR 1500 COMPLX PO  Take 1,500 mg by mouth daily.     hydrocortisone 1 % lotion  Apply 1 application topically 2 (two) times daily.  Notes to Patient:  New med     KLS ALLERCLEAR 10 MG tablet  Generic drug:  loratadine  Take 10 mg by mouth daily.     MAGNESIUM CITRATE PO  Take 250 mg by mouth daily. 1 tab daily     meclizine 25 MG tablet  Commonly known as:  ANTIVERT  Take 25 mg by mouth daily as needed for dizziness.     Resveratrol 250 MG Caps  Take 250 mg by mouth daily.     sertraline 50 MG tablet  Commonly known as:  ZOLOFT  Take 50 mg by mouth daily.     sulfamethoxazole-trimethoprim 800-160 MG per tablet  Commonly known as:  BACTRIM DS,SEPTRA DS  Take 1 tablet by mouth 2 (two) times daily.     vitamin E 400 UNIT capsule  Generic drug:  vitamin E  Take 400 Units by mouth daily.        Discharge Instructions    Call MD for:  redness, tenderness, or signs of infection (pain, swelling, redness, odor or  green/yellow discharge around incision site)    Complete by:  As directed      Diet - low sodium heart healthy    Complete by:  As directed      Increase activity slowly    Complete by:  As directed           Follow-up Information    Follow up with Sanda Klein, MD.   Specialty:  Cardiology   Why:  The office will call with an appointment for a wound check and  follow-up with Dr. Sallyanne Kuster.   Contact information:   [address] [place] [address] [phone]       BRING ALL MEDICATIONS WITH YOU TO FOLLOW UP APPOINTMENTS  Time spent with patient to include physician time: 41 min Signed: Rosaria Ferries, PA-C 10/05/2014, 4:44 PM Co-Sign MD

## 2014-10-05 NOTE — Telephone Encounter (Signed)
Instructed to take Prednisone 15m daily x 3 days. Patient instructed and voiced understanding.  Rx sent to pharmacy electronically.

## 2014-10-05 NOTE — Progress Notes (Signed)
Notified Rosaria Ferries P.A. About patient developing itching with small whelps in inner upper legs and bilateral abdomen. Not sure of the cause the only new medication given is Ancef. Benadryl given and will continue to monitor.

## 2014-10-05 NOTE — Telephone Encounter (Signed)
Pt's daughter-in- law Rod Holler called in stating that [name] is having an allergic reaction to the antibiotic that was given to her in the hospital and she would like to know what to do. Pt has taken a benadryl which has not seem to help, she has hives. Please call   Thanks

## 2014-10-05 NOTE — Progress Notes (Signed)
Patient Name: [name] [name] Date of Encounter: 10/05/2014  Principal Problem:   Sinus arrest Active Problems:   TIA (transient ischemic attack)   Tachy-brady syndrome   Chronic anticoagulation   Pacemaker    Patient Profile: 73 yo female w/ hx TIA, palpitations, had loop--> PAF, now on Eliquis. Sinus arrest also seen, symptomatic bradycardia. Pt admitted 11/03 for PPM and loop explant.   SUBJECTIVE: Patient is having no significant issues or concerns except for welts and itching that started on her upper inner thighs and are now on her back and extremities.  OBJECTIVE Filed Vitals:   10/04/14 1730 10/04/14 2128 10/05/14 0001 10/05/14 0355  BP: 135/55 113/37 136/53 155/50  Pulse: 59 61 64 71  Temp: 98.1 F (36.7 C) 97.3 F (36.3 C) 97.8 F (36.6 C) 97.7 F (36.5 C)  TempSrc: Oral Oral Oral Oral  Resp:  _0 Height:      Weight:      SpO2: 94% 94% 93% 95%    Intake/Output Summary (Last 24 hours) at 10/05/14 0827 Last data filed at 10/05/14 0100  Gross per 24 hour  Intake 699.17 ml  Output    250 ml  Net 449.17 ml   Filed Weights   10/04/14 1402  Weight: 205 lb (92.987 kg)    PHYSICAL EXAM General: Well developed, well nourished, female in no acute distress. Head: Normocephalic, atraumatic.  Neck: Supple without bruits, JVD. Lungs:  Resp regular and unlabored, CTA. Heart: RRR, S1, S2, no S3, S4, or murmur; no rub. Abdomen: Soft, non-tender, non-distended, BS + x 4.  Extremities: No clubbing, cyanosis, edema.  Neuro: Alert and oriented X 3. Moves all extremities spontaneously. Psych: Normal affect.  LABS: None  TELE:  SR, A pacing, AV pacing, some fusion beats.      ECG: SR  Radiology/Studies: Dg Chest 2 View 10/05/2014   CLINICAL DATA:  Status post pacemaker placement  EXAM: CHEST  2 VIEW  COMPARISON:  10/09/2007  FINDINGS: A pacing device is now seen on the left. No pneumothorax is noted. The lungs are well aerated bilaterally with mild  interstitial changes stable from the previous study. No acute bony abnormality is seen. The cardiac shadow is within normal limits.  IMPRESSION: No evidence of pneumothorax following pacemaker placement. No acute abnormality is seen.   Electronically Signed   By: Inez Catalina M.D.   On: 10/05/2014 08:12   Current Medications:  .  ceFAZolin (ANCEF) IV  1 g Intravenous Q6H  . digoxin  0.125 mg Oral Daily  . sertraline  50 mg Oral Daily  . you have a pacemaker book   Does not apply Once   . sodium chloride 50 mL/hr at 10/05/14 0100    ASSESSMENT AND PLAN: Principal Problem:   Sinus arrest - s/p MDT PPM 11/03, funct well, CXR OK, f/u in office   Active Problems:   TIA (transient ischemic attack) - restart home rx    Tachy-brady syndrome - see above    Chronic anticoagulation - restart Eliquis when OK with MD, ?today    Pacemaker - Medtronic Advisa model [id number] serial number [id number]    Allergic reaction - she has multiple hives and itching, we'll try Benadryl and steroid cream, hopefully avoid oral steroids  Plan - use Benadryl and steroid cream for the allergic reaction, likely secondary to cefazolin, make sure pacemaker check is performed, hopefully discharged today.  Jonetta Speak , PA-C 8:27 AM 10/05/2014

## 2014-10-05 NOTE — Telephone Encounter (Signed)
Per Dr. Sallyanne Kuster take Prednisone 47m a day x 3 days.  Patient and daughter notified and voice understanding.  Rx sent to pharmacy.

## 2014-10-05 NOTE — Telephone Encounter (Signed)
The patient had a PPM inserted yesterday by Dr. Sallyanne Kuster. The patient's daughter in law is reporting that the patient may have had a reaction antibiotics given to her at the hospital. The patient did receive ancef and this has already been listed as a drug allergy prior to leaving the hospital this morning due to itching and hives. The patient was instructed to take benadryl as needed for itching. Per the patient's daughter in law, the patient took two benadryl ~ 1:00 pm. She had hives prior to discharge this morning, but since she has been home, she has developed a full body rash, swelling in between her legs and to her hands. She is breathing ok, but is shaking and very uncomfortable at this point. I spoke with Elberta Leatherwood, pharm D. I do not know when she received her last dose of ancef, but this should clear her system within 6 hours per Gay Filler. Will forward to Dr. Sallyanne Kuster for review. I am unsure if the patient will require steroid treatment.

## 2014-10-05 NOTE — Care Management Note (Addendum)
  Page 1 of 1   10/05/2014     10:01:38 AM CARE MANAGEMENT NOTE 10/05/2014  Patient:  [name] [name], [name] [name]   Account Number:  [id number]  Date Initiated:  10/05/2014  Documentation initiated by:  Mariann Laster  Subjective/Objective Assessment:   Bradycardia     Action/Plan:   CM to follow for disposition needs   Anticipated DC Date:  10/05/2014   Anticipated DC Plan:  HOME/SELF CARE         Choice offered to / List presented to:             Status of service:  Completed, signed off Medicare Important Message given?   (If response is "NO", the following Medicare IM given date fields will be blank) Date Medicare IM given:   Medicare IM given by:   Date Additional Medicare IM given:   Additional Medicare IM given by:    Discharge Disposition:  HOME/SELF CARE  Per UR Regulation:  Reviewed for med. necessity/level of care/duration of stay  If discussed at [place] of Stay Meetings, dates discussed:    Comments:  Salene Mohamud RN, BSN, MSHL, CCM  Nurse - Case Manager,  (Unit [phone]  10/05/2014 Specialty Med Review:  None identified Dispo Plan:  Home / [place]

## 2014-10-06 ENCOUNTER — Encounter: Payer: Self-pay | Admitting: Cardiovascular Disease

## 2014-10-07 ENCOUNTER — Telehealth: Payer: Self-pay | Admitting: Cardiovascular Disease

## 2014-10-07 ENCOUNTER — Telehealth: Payer: Self-pay | Admitting: *Deleted

## 2014-10-07 NOTE — Telephone Encounter (Signed)
Spoke with patient. She missed 1 IV abx dose r/t allergic rxn. Wanted to know if this was OK and if she needed PO abx to cover her. Spoke to [place], [place] for clarification and she should be fine. Communicated this to patient. She voiced understanding.

## 2014-10-07 NOTE — Telephone Encounter (Signed)
Calling because she is wanting to let Dr.Croitoru know that she did not get her last antibiotic after the placement of the pacemaker because she was allergic to the antibiotic and want to know shouldn't she have had something else to replace that. Please her at [phone]  Thanks

## 2014-10-11 ENCOUNTER — Encounter: Payer: Self-pay | Admitting: Cardiovascular Disease

## 2014-10-13 ENCOUNTER — Encounter: Payer: Self-pay | Admitting: Cardiology

## 2014-10-13 ENCOUNTER — Ambulatory Visit (INDEPENDENT_AMBULATORY_CARE_PROVIDER_SITE_OTHER): Payer: Medicare Other | Admitting: Cardiology

## 2014-10-13 VITALS — BP 140/70 | HR 68 | Ht 67.0 in | Wt 205.3 lb

## 2014-10-13 DIAGNOSIS — Z950 Presence of cardiac pacemaker: Secondary | ICD-10-CM

## 2014-10-13 NOTE — Progress Notes (Signed)
10/13/2014   PCP: Reginia Naas, MD   Chief Complaint  Patient presents with  . Follow-up    staple removal    Primary Cardiologist:Dr. Bertrum Sol   HPI:  73 y.o. female with a history of TIA, palpitations, loop recorder implanted--> PAF, now on Eliquis. Sinus arrest also seen, as well as symptomatic bradycardia. Pacemaker was scheduled.  10/04/14 she had a Medtronic dual lead pacemaker implanted without immediate complication.  She is back for staple removal.  No complaints except her lt shoulder painful.  She will begin moving her lt arm.  Also post procedure with ancef she developed whelp, chart now notes allergy.  She does have mild macular papular rash, easily blanches around pacer site.  She used sanitary pad to protect site and has since had this rash.  Also placed gel around area.  Not itching.       Allergies  Allergen Reactions  . Ancef [Cefazolin] Hives and Itching  . Caffeine Palpitations    Current Outpatient Prescriptions  Medication Sig Dispense Refill  . acetaminophen (TYLENOL) 325 MG tablet Take 325 mg by mouth daily as needed (pain).    Marland Kitchen apixaban (ELIQUIS) 5 MG TABS tablet Take 1 tablet (5 mg total) by mouth 2 (two) times daily. HOLD for now, resume with 11/05 PM dose. 180 tablet 3  . Biotin 5000 MCG CAPS Take 5,000 mcg by mouth daily.     . Calcium Carbonate-Vit D-Min (CALCIUM 1200 PO) Take 2,400 mg by mouth daily.     . cholecalciferol (VITAMIN D) 1000 UNITS tablet Take 1,000 Units by mouth daily.     . ciprofloxacin (CIPRO) 250 MG tablet Take 250 mg by mouth See admin instructions. Take 1 tablet (250 mg) twice daily for 3 days as need for bladder infections    . Co-Enzyme Q10 100 MG CAPS Take 100 mg by mouth daily.     . diazepam (VALIUM) 5 MG tablet Take 5 mg by mouth daily as needed for anxiety.     . digoxin (LANOXIN) 0.125 MG tablet Take 1 tablet (0.125 mg total) by mouth daily. 30 tablet 11  . diphenhydrAMINE (BENADRYL) 50 MG  tablet Take 0.5-1 tablets (25-50 mg total) by mouth every 6 (six) hours as needed for itching. 30 tablet 0  . estradiol (ESTRACE) 0.1 MG/GM vaginal cream Place 1 Applicatorful vaginally 3 (three) times a week.     . folic acid (FOLVITE) 962 MCG tablet Take 400 mcg by mouth daily.    . Glucosamine-Chondroit-Vit C-Mn (GLUCOSAMINE CHONDR 1500 COMPLX PO) Take 1,500 mg by mouth daily.     . hydrocortisone 1 % lotion Apply 1 application topically 2 (two) times daily. 118 mL 0  . loratadine (KLS ALLERCLEAR) 10 MG tablet Take 10 mg by mouth daily.    Marland Kitchen MAGNESIUM CITRATE PO Take 250 mg by mouth daily. 1 tab daily    . meclizine (ANTIVERT) 25 MG tablet Take 25 mg by mouth daily as needed for dizziness.     . Menthol, Topical Analgesic, (BIOFREEZE EX) Apply 1 application topically daily as needed (hip pain).    . Resveratrol 250 MG CAPS Take 250 mg by mouth daily.    . sertraline (ZOLOFT) 50 MG tablet Take 50 mg by mouth daily.     . Simethicone (GAS-X PO) Take 2 tablets by mouth daily as needed (bloating).    . vitamin E (VITAMIN E) 400 UNIT capsule Take 400 Units by mouth  daily.     No current facility-administered medications for this visit.    Past Medical History  Diagnosis Date  . Hypertension   . MVP (mitral valve prolapse)   . PVC's (premature ventricular contractions)   . Obesity   . Presence of permanent cardiac pacemaker   . Complication of anesthesia     "I woke up during 2 different procedures" (10/04/2014)  . Atrial fibrillation   . Stroke 01/2014    denies deficits on 10/04/2014  . GERD (gastroesophageal reflux disease)   . Arthritis     "all over"  . Frequent UTI   . 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 Sinus arrest 10/2014    s/p Medtronic Advisa model [id number] serial number [id number]    Past Surgical History  Procedure Laterality Date  . Excision vaginal cyst      benign nodules  . Tubal ligation    . US echocardiography  05/15/2010    LA mildly dilated,mild mitral annular  ca+, AOV mildly sclerotic, mild asymmetric LVH  . Nm myocar perf wall motion  12/18/2007    normal  . Insert / replace / remove pacemaker  10/04/2014    Medtronic Advisa model [id number] serial number [id number] H    PQD:IYMEBRA:XE colds or fevers, no weight changes Skin:+ rashes no ulcers HEENT:no blurred vision, no congestion CV:see HPI PUL:see HPI  Wt Readings from Last 3 Encounters:  10/13/14 205 lb 4.8 oz (93.123 kg)  10/04/14 205 lb (92.987 kg)  09/26/14 209 lb (94.802 kg)    PHYSICAL EXAM BP 140/70 mmHg  Pulse 68  Ht _0  (1.702 m)  Wt 205 lb 4.8 oz (93.123 kg)  BMI 32.15 kg/m2 General:Pleasant affect, NAD Skin:Warm and dry, brisk capillary refill, + rash papular lt ant chest, blanches easily HEENT:normocephalic, sclera clear, mucus membranes moist Heart:S1S2 RRR without murmur, gallup, rub or click Lungs:clear without rales, rhonchi, or wheezes Pacer site stable no redness, site cleaned and staples removed.    ASSESSMENT AND PLAN Pacemaker Staples removed without complications to follow up with device check. Follow up with Dr. Jerilynn Mages. Croitoru in 3 months.

## 2014-10-13 NOTE — Patient Instructions (Addendum)
Your physician recommends that you schedule a follow-up appointment in: keep appointment with Dr Sallyanne Kuster on 11/24  Your physician has recommended you make the following change in your medication: Take an over the counter benadryl as needed  Move left arm so it doesn't freeze

## 2014-10-13 NOTE — Assessment & Plan Note (Signed)
Staples removed without complications to follow up with device check. Follow up with Dr. Jerilynn Mages. Croitoru in 3 months.

## 2014-10-17 NOTE — Telephone Encounter (Signed)
Pt never returned my call concerning her ppm site. Patient saw Cecilie Kicks on 11-12. Site appeared stable at that appt be ofc note.

## 2014-10-18 ENCOUNTER — Telehealth: Payer: Self-pay | Admitting: Cardiovascular Disease

## 2014-10-18 NOTE — Telephone Encounter (Signed)
[name] [name] is calling because when she came in on last week and saw Mickel Baas , her stitches were removed but the two staples were not removed . Would it be ok for them to say until she sees Dr.Croitoru on  next week . Please call

## 2014-10-19 ENCOUNTER — Telehealth: Payer: Self-pay | Admitting: Cardiology

## 2014-10-19 NOTE — Telephone Encounter (Signed)
Updated pt to leave staples in place until appt w/ Dr. Sallyanne Kuster. Pt will comply. Pt also states she feel substantially better since getting her new pacemaker.

## 2014-10-19 NOTE — Telephone Encounter (Signed)
Opened in error

## 2014-10-24 DIAGNOSIS — I10 Essential (primary) hypertension: Secondary | ICD-10-CM | POA: Diagnosis not present

## 2014-10-24 DIAGNOSIS — Z0000 Encounter for general adult medical examination without abnormal findings: Secondary | ICD-10-CM | POA: Diagnosis not present

## 2014-10-24 DIAGNOSIS — K219 Gastro-esophageal reflux disease without esophagitis: Secondary | ICD-10-CM | POA: Diagnosis not present

## 2014-10-24 DIAGNOSIS — F329 Major depressive disorder, single episode, unspecified: Secondary | ICD-10-CM | POA: Diagnosis not present

## 2014-10-24 DIAGNOSIS — Z13220 Encounter for screening for lipoid disorders: Secondary | ICD-10-CM | POA: Diagnosis not present

## 2014-10-25 ENCOUNTER — Ambulatory Visit (INDEPENDENT_AMBULATORY_CARE_PROVIDER_SITE_OTHER): Payer: Medicare Other | Admitting: Cardiovascular Disease

## 2014-10-25 ENCOUNTER — Encounter: Payer: Self-pay | Admitting: Cardiovascular Disease

## 2014-10-25 VITALS — BP 132/84 | HR 92 | Resp 16 | Ht 67.0 in | Wt 207.1 lb

## 2014-10-25 DIAGNOSIS — Z950 Presence of cardiac pacemaker: Secondary | ICD-10-CM

## 2014-10-25 DIAGNOSIS — I495 Sick sinus syndrome: Secondary | ICD-10-CM | POA: Diagnosis not present

## 2014-10-25 DIAGNOSIS — Z7901 Long term (current) use of anticoagulants: Secondary | ICD-10-CM

## 2014-10-25 DIAGNOSIS — I480 Paroxysmal atrial fibrillation: Secondary | ICD-10-CM

## 2014-10-25 DIAGNOSIS — I455 Other specified heart block: Secondary | ICD-10-CM

## 2014-10-25 DIAGNOSIS — G459 Transient cerebral ischemic attack, unspecified: Secondary | ICD-10-CM

## 2014-10-25 LAB — MDC_IDC_ENUM_SESS_TYPE_INCLINIC
Battery Remaining Longevity: 12
Brady Statistic AP VP Percent: 0.1 % — CL
Lead Channel Impedance Value: 684 Ohm
Lead Channel Pacing Threshold Amplitude: 0.75 V
Lead Channel Pacing Threshold Pulse Width: 0.4 ms
Lead Channel Pacing Threshold Pulse Width: 0.4 ms
Lead Channel Sensing Intrinsic Amplitude: 11.8 mV
Lead Channel Setting Pacing Amplitude: 3.5 V
Lead Channel Setting Pacing Pulse Width: 0.4 ms
Lead Channel Setting Sensing Sensitivity: 2.8 mV
MDC IDC MSMT BATTERY VOLTAGE: 3.11 V
MDC IDC MSMT LEADCHNL RA IMPEDANCE VALUE: 437 Ohm
MDC IDC MSMT LEADCHNL RA PACING THRESHOLD AMPLITUDE: 0.5 V
MDC IDC MSMT LEADCHNL RA SENSING INTR AMPL: 5.1 mV
MDC IDC SET LEADCHNL RV PACING AMPLITUDE: 3.5 V
MDC IDC STAT BRADY AP VS PERCENT: 53.6 %
MDC IDC STAT BRADY AS VP PERCENT: 0.1 % — AB
MDC IDC STAT BRADY AS VS PERCENT: 46.3 %

## 2014-10-25 LAB — CBC AND DIFFERENTIAL
HEMATOCRIT: 42 % (ref 36–46)
HEMOGLOBIN: 13.6 g/dL (ref 12.0–16.0)
NEUTROS ABS: 4 /uL
PLATELETS: 215 10*3/uL (ref 150–399)
WBC: 8 10*3/mL

## 2014-10-25 LAB — BASIC METABOLIC PANEL
BUN: 17 mg/dL (ref 4–21)
CREATININE: 0.9 mg/dL (ref <=1.1)
Glucose: 94 mg/dL
Potassium: 4.2 mmol/L (ref 3.4–5.3)
SODIUM: 138 mmol/L (ref 137–147)

## 2014-10-25 LAB — LIPID PANEL
CHOLESTEROL: 184 mg/dL (ref 0–200)
HDL: 46 mg/dL (ref 35–70)
LDL Cholesterol: 138 mg/dL
TRIGLYCERIDES: 210 mg/dL — AB (ref 40–160)

## 2014-10-25 LAB — HEPATIC FUNCTION PANEL
ALT: 16 U/L (ref 7–35)
AST: 18 U/L (ref 13–35)
Alkaline Phosphatase: 71 U/L (ref 25–125)
BILIRUBIN, TOTAL: 0.3 mg/dL

## 2014-10-25 NOTE — Patient Instructions (Signed)
Dr. Sallyanne Kuster recommends that you schedule a follow-up appointment in: 3 months with device check.

## 2014-10-26 ENCOUNTER — Encounter: Payer: Self-pay | Admitting: Cardiovascular Disease

## 2014-10-26 NOTE — Progress Notes (Signed)
[name], female   DOB: [birth year], 73 y.o.   MRN: [id number]      Reason for office visit PAFib, sinus node arrest, s/p recent dual chamber pacemaker   [name] [name] underwent dual chamber Medtronic pacemaker implantation after her loop recorder (implanted for cryptogenic TIA) showed evidence of both PAF and sinus node arrest. The wound has healed well and her rash has resolved.   She states she feels better (I wonder if this is a placebo effect or related to sinus node dysfunction that was worse than we recognized - she has >50% atrial pacing). Lead parameters are excellent, but it is premature to reprogram to chronic output settings. No new AFib since pacemaker placement - her ILR had shown roughly 3% AF burden.  She is currently not taking any beta blocker and is only on a very low-dose of digoxin. Ventricular rate control was fair during the episode of atrial fibrillation with average ventricular rate of 96 bpm, but with peak ventricular rates in the 160s.  She has not had any new neurological events and denies any bleeding complications on Eliquis. Has trouble affording the Eliquis and we are looking into solutions until the end of the year.  She has normal left ventricular systolic function and a left atrium atthe upper limit of normal in size. There are no significant valvular abnormalities. No angina. Normal nuclear stress test in 2009.   Allergies  Allergen Reactions  . Ancef [Cefazolin] Hives and Itching  . Caffeine Palpitations      Past Medical History  Diagnosis Date  . Hypertension   . MVP (mitral valve prolapse)   . PVC's (premature ventricular contractions)   . Obesity   . Presence of permanent cardiac pacemaker   . Complication of anesthesia     "I woke up during 2 different procedures" (10/04/2014)  . Atrial fibrillation   . Stroke 01/2014    denies deficits on 10/04/2014  . GERD (gastroesophageal reflux disease)   . Arthritis     "all over"  .  Frequent UTI   . 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 Sinus arrest 10/2014    s/p Medtronic Advisa model [id number] serial number [id number] H    Past Surgical History  Procedure Laterality Date  . Excision vaginal cyst      benign nodules  . Tubal ligation    . US echocardiography  05/15/2010    LA mildly dilated,mild mitral annular ca+, AOV mildly sclerotic, mild asymmetric LVH  . Nm myocar perf wall motion  12/18/2007    normal  . Insert / replace / remove pacemaker  10/04/2014    Medtronic Advisa model A2DR01 serial number [id number] H    Family History  Problem Relation Age of Onset  . Heart failure Mother   . Stroke Father   . Heart failure Brother     History   Social History  . Marital Status: Married    Spouse Name: N/A    Number of Children: N/A  . Years of Education: N/A   Occupational History  . Not on file.   Social History Main Topics  . Smoking status: Former Smoker -- 2.00 packs/day for 18 years    Types: Cigarettes  . Smokeless tobacco: Never Used     Comment: "quit smoking in 1971"  . Alcohol Use: Yes     Comment: 10/04/2014 "might have a drink a couple times/yr"  . Drug Use: No  . Sexual Activity: No   Other Topics Concern  .  Not on file   Social History Narrative    Review of systems: Improved fatigue and dyspnea. The patient specifically denies any chest pain at rest or with exertion, orthopnea, paroxysmal nocturnal dyspnea, new focal neurological deficits, intermittent claudication, lower extremity edema, unexplained weight gain, cough, hemoptysis or wheezing.  The patient also denies abdominal pain, nausea, vomiting, dysphagia, diarrhea, constipation, polyuria, polydipsia, dysuria, hematuria, frequency, urgency, abnormal bleeding or bruising, fever, chills, unexpected weight changes, mood swings, change in skin or hair texture, change in voice quality, auditory or visual problems, allergic reactions or rashes, new musculoskeletal complaints other than  usual "aches and pains".  PHYSICAL EXAM BP 132/84 mmHg  Pulse 92  Resp 16  Ht _0  (1.702 m)  Wt 207 lb 1.6 oz (93.94 kg)  BMI 32.43 kg/m2 General: Alert, oriented x3, no distress  Head: no evidence of trauma, PERRL, EOMI, no exophtalmos or lid lag, no myxedema, no xanthelasma; normal ears, nose and oropharynx  Neck: normal jugular venous pulsations and no hepatojugular reflux; brisk carotid pulses without delay and no carotid bruits  Chest: clear to auscultation, no signs of consolidation by percussion or palpation, normal fremitus, symmetrical and full respiratory excursions ; healthy loop recorder scar and pacemaker site. Cardiovascular: normal position and quality of the apical impulse, regular rhythm, normal first and second heart sounds, no murmurs, rubs or gallops  Abdomen: no tenderness or distention, no masses by palpation, no abnormal pulsatility or arterial bruits, normal bowel sounds, no hepatosplenomegaly  Extremities: no clubbing, cyanosis or edema; 2+ radial, ulnar and brachial pulses bilaterally; 2+ right femoral, posterior tibial and dorsalis pedis pulses; 2+ left femoral, posterior tibial and dorsalis pedis pulses; no subclavian or femoral bruits  Neurological: grossly nonfocal  Lipid Panel     Component Value Date/Time   CHOL 152 03/09/2014 0440   TRIG 87 03/09/2014 0440   HDL 58 03/09/2014 0440   CHOLHDL 2.6 03/09/2014 0440   VLDL 17 03/09/2014 0440   LDLCALC 77 03/09/2014 0440    BMET    Component Value Date/Time   NA 139 09/28/2014 1557   K 4.6 09/28/2014 1557   CL 103 09/28/2014 1557   CO2 25 09/28/2014 1557   GLUCOSE 82 09/28/2014 1557   BUN 18 09/28/2014 1557   CREATININE 0.86 09/28/2014 1557   CREATININE 0.87 03/09/2014 0440   CALCIUM 8.9 09/28/2014 1557   GFRNONAA 56* 06/06/2014 1653   GFRNONAA 65* 03/09/2014 0440   GFRAA 65 06/06/2014 1653   GFRAA 75* 03/09/2014 0440     ASSESSMENT AND PLAN  Tachy-brady syndrome [name] [name] appears to  have improved functional status due to treatment for SSS with chronotropic incompetence (also had postconversion sinus node arrest lasting up to 4 seconds).  Will probably be able to improve upon this as we optimize sensor settings over time.  TIA (transient ischemic attack) Retrospectively, this is most likely secondary to atrial fibrillation. Keep on anticoagulation.  PAF (paroxysmal atrial fibrillation) No recurrence of AF recently. Will review the need for rate control versus rhythm control medications. She understands that if a pacemaker is implanted, radiofrequency ablation cannot be performed for a lengthy period of time, until the leads mature. Low threshold for digoxin discontinuation. Better off on beta blocker if RVR occurs. Discuss at next appointment.  Holli Humbles, MD, [place] [phone] office [phone] pager

## 2014-11-01 ENCOUNTER — Encounter: Payer: Self-pay | Admitting: Cardiovascular Disease

## 2014-11-01 DIAGNOSIS — N3020 Other chronic cystitis without hematuria: Secondary | ICD-10-CM | POA: Diagnosis not present

## 2014-11-08 ENCOUNTER — Encounter: Payer: Self-pay | Admitting: Cardiovascular Disease

## 2014-11-10 ENCOUNTER — Encounter (HOSPITAL_COMMUNITY): Payer: Self-pay | Admitting: Cardiovascular Disease

## 2014-11-16 ENCOUNTER — Telehealth: Payer: Self-pay | Admitting: Cardiovascular Disease

## 2014-11-16 MED ORDER — DIGOXIN 125 MCG PO TABS: 0.1250 mg | ORAL_TABLET | Freq: Every day | ORAL | Status: DC

## 2014-11-16 NOTE — Telephone Encounter (Signed)
Pt called in wanting to see if she could get her Digoxin script in a 90 day supply. She stated that she will be going to [facility] to get that prescription today. Please call  Thanks

## 2014-12-19 ENCOUNTER — Ambulatory Visit: Payer: Medicare Other | Admitting: Internal Medicine

## 2014-12-22 ENCOUNTER — Telehealth: Payer: Self-pay | Admitting: Pharmacist Clinician (PhC)/ Clinical Pharmacy Specialist

## 2014-12-22 NOTE — Telephone Encounter (Signed)
Pt LMOM concerned about Prior Auth for Eliquis  Returned call, pt not sure if insurance will require PA for her Eliquis.  Picked up first Rx on 1/2 without problem, copay was high due to deductible.  Advised her that if a PA is required the insurance will not pay, so probably not needed.  Advised that she call for refill in next 5-6 days and if the PA is required the pharmacy will call us.  Pt seems confused, but will call if not able to get at pharmacy.

## 2014-12-28 DIAGNOSIS — M7551 Bursitis of right shoulder: Secondary | ICD-10-CM | POA: Diagnosis not present

## 2014-12-28 DIAGNOSIS — M75101 Unspecified rotator cuff tear or rupture of right shoulder, not specified as traumatic: Secondary | ICD-10-CM | POA: Diagnosis not present

## 2014-12-28 DIAGNOSIS — M25511 Pain in right shoulder: Secondary | ICD-10-CM | POA: Diagnosis not present

## 2015-01-04 DIAGNOSIS — H3531 Nonexudative age-related macular degeneration: Secondary | ICD-10-CM | POA: Diagnosis not present

## 2015-01-04 DIAGNOSIS — H25813 Combined forms of age-related cataract, bilateral: Secondary | ICD-10-CM | POA: Diagnosis not present

## 2015-01-06 DIAGNOSIS — R1013 Epigastric pain: Secondary | ICD-10-CM | POA: Diagnosis not present

## 2015-01-09 DIAGNOSIS — M25511 Pain in right shoulder: Secondary | ICD-10-CM | POA: Diagnosis not present

## 2015-01-10 ENCOUNTER — Other Ambulatory Visit: Payer: Self-pay | Admitting: Gastroenterology

## 2015-01-10 DIAGNOSIS — IMO0001 Reserved for inherently not codable concepts without codable children: Secondary | ICD-10-CM

## 2015-01-10 DIAGNOSIS — R1310 Dysphagia, unspecified: Secondary | ICD-10-CM | POA: Diagnosis not present

## 2015-01-10 DIAGNOSIS — K219 Gastro-esophageal reflux disease without esophagitis: Secondary | ICD-10-CM | POA: Diagnosis not present

## 2015-01-11 ENCOUNTER — Ambulatory Visit
Admission: RE | Admit: 2015-01-11 | Discharge: 2015-01-11 | Disposition: A | Discharge status: Home / Self Care | Payer: Medicare Other | Source: Ambulatory Visit | Attending: Gastroenterology | Admitting: Gastroenterology

## 2015-01-11 DIAGNOSIS — IMO0001 Reserved for inherently not codable concepts without codable children: Secondary | ICD-10-CM

## 2015-01-11 DIAGNOSIS — K219 Gastro-esophageal reflux disease without esophagitis: Principal | ICD-10-CM

## 2015-01-11 DIAGNOSIS — R1310 Dysphagia, unspecified: Secondary | ICD-10-CM | POA: Diagnosis not present

## 2015-01-13 ENCOUNTER — Telehealth: Payer: Self-pay | Admitting: *Deleted

## 2015-01-13 NOTE — Telephone Encounter (Signed)
PA done for Eliquis 27m through CoverMyMeds.

## 2015-01-14 ENCOUNTER — Emergency Department (INDEPENDENT_AMBULATORY_CARE_PROVIDER_SITE_OTHER)
Admission: EM | Admit: 2015-01-14 | Discharge: 2015-01-14 | Disposition: A | Discharge status: Home / Self Care | Payer: Medicare Other | Source: Home / Self Care | Attending: Family Medicine | Admitting: Family Medicine

## 2015-01-14 ENCOUNTER — Encounter (HOSPITAL_COMMUNITY): Payer: Self-pay

## 2015-01-14 ENCOUNTER — Telehealth: Payer: Self-pay | Admitting: Physician Assistant

## 2015-01-14 DIAGNOSIS — N939 Abnormal uterine and vaginal bleeding, unspecified: Secondary | ICD-10-CM | POA: Diagnosis not present

## 2015-01-14 NOTE — Telephone Encounter (Signed)
Pt called in the answering service this morning c/o bright red bleeding from her vagina when she went to the bathroom this AM. She is on Eliquis at the appropriate dose per most recent labs in chart.  Has h/o stroke with CHADSVASC at least 5 so unfortunately just stopping Eliquis is not a good option.  Denies any other other symptoms.  Regardless of blood thinner status, post-menopausal bleeding is almost never normal thus I advised she proceed to urgent care for further evaluation today (for probable pelvic exam including r/o infections; will likely need further workup needed with GYN). She verbalized understanding and gratitude and plans to proceed. Samil Mecham PA-C

## 2015-01-14 NOTE — Discharge Instructions (Signed)
Call dr Verlon Au office on mon for recheck, [facility] if bleeding increases.[name] [name]

## 2015-01-14 NOTE — ED Notes (Signed)
States vaginal bleeding onset today

## 2015-01-14 NOTE — ED Provider Notes (Signed)
CSN: [id number]     Arrival date & time 01/14/15  1225 History   First MD Initiated Contact with Patient 01/14/15 1336     Chief Complaint  Patient presents with  . Vaginal Bleeding   (Consider location/radiation/quality/duration/timing/severity/associated sxs/prior Treatment) Patient is a 74 y.o. female presenting with vaginal bleeding. The history is provided by the patient.  Vaginal Bleeding Quality:  Spotting Severity:  Mild Onset quality:  Sudden Duration:  6 hours Progression:  Unchanged Chronicity:  New Number of pads used:  1 Possible pregnancy: no   Context: spontaneously   Context comment:  Started on eliquis 1-2 mos ago. Worsened by:  Nothing tried Ineffective treatments:  None tried Associated symptoms: no abdominal pain, no back pain, no dizziness, no fatigue and no vaginal discharge     Past Medical History  Diagnosis Date  . Hypertension   . MVP (mitral valve prolapse)   . PVC's (premature ventricular contractions)   . Obesity   . Presence of permanent cardiac pacemaker   . Complication of anesthesia     "I woke up during 2 different procedures" (10/04/2014)  . Atrial fibrillation   . Stroke 01/2014    denies deficits on 10/04/2014  . GERD (gastroesophageal reflux disease)   . Arthritis     "all over"  . Frequent UTI   . 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 Sinus arrest 10/2014    s/p Medtronic Advisa model [id number] serial number [id number]   Past Surgical History  Procedure Laterality Date  . Excision vaginal cyst      benign nodules  . Tubal ligation    . US echocardiography  05/15/2010    LA mildly dilated,mild mitral annular ca+, AOV mildly sclerotic, mild asymmetric LVH  . Nm myocar perf wall motion  12/18/2007    normal  . Insert / replace / remove pacemaker  10/04/2014    Medtronic Advisa model [id number] serial number [id number]  . Loop recorder implant N/A 04/19/2014    Procedure: LOOP RECORDER IMPLANT;  Surgeon: Sanda Klein, MD;  Location: [place];  Service: Cardiovascular;  Laterality: N/A;  . Loop recorder explant N/A 10/04/2014    Procedure: LOOP RECORDER EXPLANT;  Surgeon: Sanda Klein, MD;  Location: [facility] [facility];  Service: Cardiovascular;  Laterality: N/A;  . Permanent pacemaker insertion N/A 10/04/2014    Procedure: PERMANENT PACEMAKER INSERTION;  Surgeon: Sanda Klein, MD;  Location: [facility] [facility];  Service: Cardiovascular;  Laterality: N/A;   Family History  Problem Relation Age of Onset  . Heart failure Mother   . Stroke Father   . Heart failure Brother    History  Substance Use Topics  . Smoking status: Former Smoker -- 2.00 packs/day for 18 years    Types: Cigarettes  . Smokeless tobacco: Never Used     Comment: "quit smoking in 1971"  . Alcohol Use: Yes     Comment: 10/04/2014 "might have a drink a couple times/yr"   OB History    No data available     Review of Systems  Constitutional: Negative.  Negative for fatigue.  Gastrointestinal: Negative.  Negative for abdominal pain.  Genitourinary: Positive for vaginal bleeding. Negative for vaginal discharge and pelvic pain.  Musculoskeletal: Negative for back pain.  Neurological: Negative for dizziness.    Allergies  Ancef and Caffeine  Home Medications   Prior to Admission medications   Medication Sig Start Date End Date Taking? Authorizing Provider  acetaminophen (TYLENOL) 325 MG tablet Take 325 mg by mouth  daily as needed (pain).    Historical Provider, MD  apixaban (ELIQUIS) 5 MG TABS tablet Take 1 tablet (5 mg total) by mouth 2 (two) times daily. HOLD for now, resume with 11/05 PM dose. 10/05/14   Evelene Croon Barrett, PA-C  Biotin 5000 MCG CAPS Take 5,000 mcg by mouth daily.     Historical Provider, MD  Calcium Carbonate-Vit D-Min (CALCIUM 1200 PO) Take 2,400 mg by mouth daily.     Historical Provider, MD  cholecalciferol (VITAMIN D) 1000 UNITS tablet Take 1,000 Units by mouth daily.     Historical Provider, MD  ciprofloxacin (CIPRO) 250 MG tablet Take  250 mg by mouth See admin instructions. Take 1 tablet (250 mg) twice daily for 3 days as need for bladder infections 06/27/14   Isaiah Serge, NP  Co-Enzyme Q10 100 MG CAPS Take 100 mg by mouth daily.     Historical Provider, MD  diazepam (VALIUM) 5 MG tablet Take 5 mg by mouth daily as needed for anxiety.     Historical Provider, MD  digoxin (LANOXIN) 0.125 MG tablet Take 1 tablet (0.125 mg total) by mouth daily. 11/16/14   Mihai Croitoru, MD  diphenhydrAMINE (BENADRYL) 50 MG tablet Take 0.5-1 tablets (25-50 mg total) by mouth every 6 (six) hours as needed for itching. 10/05/14   Rhonda G Barrett, PA-C  estradiol (ESTRACE) 0.1 MG/GM vaginal cream Place 1 Applicatorful vaginally 3 (three) times a week.     Historical Provider, MD  folic acid (FOLVITE) 818 MCG tablet Take 400 mcg by mouth daily.    Historical Provider, MD  Glucosamine-Chondroit-Vit C-Mn (GLUCOSAMINE CHONDR 1500 COMPLX PO) Take 1,500 mg by mouth daily.     Historical Provider, MD  hydrocortisone 1 % lotion Apply 1 application topically 2 (two) times daily. 10/05/14   Rhonda G Barrett, PA-C  loratadine (KLS ALLERCLEAR) 10 MG tablet Take 10 mg by mouth daily.    Historical Provider, MD  MAGNESIUM CITRATE PO Take 250 mg by mouth daily. 1 tab daily    Historical Provider, MD  meclizine (ANTIVERT) 25 MG tablet Take 25 mg by mouth daily as needed for dizziness.  03/31/14   Historical Provider, MD  Menthol, Topical Analgesic, (BIOFREEZE EX) Apply 1 application topically daily as needed (hip pain).    Historical Provider, MD  Resveratrol 250 MG CAPS Take 250 mg by mouth daily.    Historical Provider, MD  sertraline (ZOLOFT) 50 MG tablet Take 50 mg by mouth daily.     Historical Provider, MD  Simethicone (GAS-X PO) Take 2 tablets by mouth daily as needed (bloating).    Historical Provider, MD  vitamin E (VITAMIN E) 400 UNIT capsule Take 400 Units by mouth daily.    Historical Provider, MD   BP 138/81 mmHg  Pulse 74  Temp(Src) 98 F (36.7 C)  (Oral)  Resp 16  SpO2 96% Physical Exam  Constitutional: She is oriented to person, place, and time. She appears well-developed and well-nourished. No distress.  Abdominal: Soft. Bowel sounds are normal. She exhibits no mass. There is no tenderness.  Neurological: She is alert and oriented to person, place, and time.  Skin: Skin is warm and dry.  Nursing note and vitals reviewed.   ED Course  Procedures (including critical care time) Labs Review Labs Reviewed - No data to display  Imaging Review No results found.   MDM   1. Abnormal vaginal bleeding    Discussed with dr Matthew Saras, pt can be seen on mon in  office for eval.    Billy Fischer, MD 01/14/15 [phone]

## 2015-01-16 ENCOUNTER — Telehealth: Payer: Self-pay | Admitting: *Deleted

## 2015-01-16 DIAGNOSIS — N950 Postmenopausal bleeding: Secondary | ICD-10-CM | POA: Diagnosis not present

## 2015-01-16 NOTE — Telephone Encounter (Signed)
PA approved for Eliquis 01/13/15 through 01/13/16 by [employer].

## 2015-01-19 DIAGNOSIS — M25511 Pain in right shoulder: Secondary | ICD-10-CM | POA: Diagnosis not present

## 2015-01-20 ENCOUNTER — Telehealth: Payer: Self-pay | Admitting: Cardiovascular Disease

## 2015-01-20 ENCOUNTER — Encounter: Payer: Self-pay | Admitting: Cardiovascular Disease

## 2015-01-20 NOTE — Telephone Encounter (Signed)
Letter faxed.

## 2015-01-20 NOTE — Telephone Encounter (Signed)
Hold eliquis 48 h before procedure. Will have letter ready in Epic if you could fax please

## 2015-01-20 NOTE — Telephone Encounter (Signed)
Pt is on  Eliquis,she is scheduled for Hysteroscopy with a DNC on 01-25-15. Does she need to hold her Eliquis and if so how long? Please fax this to [phone] Att:Tanya Please do this today if possible.

## 2015-01-31 ENCOUNTER — Ambulatory Visit (INDEPENDENT_AMBULATORY_CARE_PROVIDER_SITE_OTHER): Payer: Medicare Other | Admitting: Cardiovascular Disease

## 2015-01-31 ENCOUNTER — Encounter: Payer: Self-pay | Admitting: Cardiovascular Disease

## 2015-01-31 VITALS — BP 107/70 | HR 79 | Resp 16 | Ht 67.0 in | Wt 215.8 lb

## 2015-01-31 DIAGNOSIS — R5383 Other fatigue: Secondary | ICD-10-CM | POA: Diagnosis not present

## 2015-01-31 DIAGNOSIS — R0683 Snoring: Secondary | ICD-10-CM

## 2015-01-31 DIAGNOSIS — Z950 Presence of cardiac pacemaker: Secondary | ICD-10-CM

## 2015-01-31 DIAGNOSIS — I480 Paroxysmal atrial fibrillation: Secondary | ICD-10-CM | POA: Diagnosis not present

## 2015-01-31 DIAGNOSIS — I455 Other specified heart block: Secondary | ICD-10-CM | POA: Diagnosis not present

## 2015-01-31 DIAGNOSIS — G4700 Insomnia, unspecified: Secondary | ICD-10-CM

## 2015-01-31 DIAGNOSIS — I495 Sick sinus syndrome: Secondary | ICD-10-CM

## 2015-01-31 LAB — HM MAMMOGRAPHY: HM Mammogram: NORMAL (ref 0–4)

## 2015-01-31 LAB — HM PAP SMEAR

## 2015-01-31 NOTE — Progress Notes (Signed)
[name], female   DOB: 10-May-1941, 74 y.o.   MRN: [id number]     Cardiology Office Note   Date:  02/07/2015   ID:  [name] [name], DOB June 06, 1941, MRN [id number]  PCP:  Reginia Naas, MD  Cardiologist:   Sanda Klein, MD   Chief Complaint  Patient presents with  . Follow-up    3 months:  C/O SOB with minimal activity, fatigues easily.  No chest pain      History of Present Illness: [name] [name] is a 74 y.o. female who presents for paroxysmal atrial fibrillation, pacemaker for SSS and tachy-brady sd and HTN. She complains of fatigue, daytime sleepiness, inappropriate drowsiness/sleep and is a loud snorer. Had mild vaginal bleeding that has stopped and may need a D&C or biopsy in near future. No other eeding or embolic events on Eliquis.  Normal pacemaker check. Medtronic Advisa, estimated longevity 11 years. 62% A paced, 0.2% V paced. No AFib recorded and 3 brief episodes of NSVT, up to 18 beats.  She has normal left ventricular systolic function and a left atrium at the upper limit of normal in size. There are no significant valvular abnormalities. No angina. Normal nuclear stress test in 2009.     Past Medical History  Diagnosis Date  . Hypertension   . MVP (mitral valve prolapse)   . PVC's (premature ventricular contractions)   . Obesity   . Presence of permanent cardiac pacemaker   . Complication of anesthesia     "I woke up during 2 different procedures" (10/04/2014)  . Atrial fibrillation   . Stroke 01/2014    denies deficits on 10/04/2014  . GERD (gastroesophageal reflux disease)   . Arthritis     "all over"  . Frequent UTI   . 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 Sinus arrest 10/2014    s/p Medtronic Advisa model [id number] serial number [id number]    Past Surgical History  Procedure Laterality Date  . Excision vaginal cyst      benign nodules  . Tubal ligation    . US echocardiography  05/15/2010    LA mildly dilated,mild mitral  annular ca+, AOV mildly sclerotic, mild asymmetric LVH  . Nm myocar perf wall motion  12/18/2007    normal  . Insert / replace / remove pacemaker  10/04/2014    Medtronic Advisa model A2DR01 serial number [id number]  . Loop recorder implant N/A 04/19/2014    Procedure: LOOP RECORDER IMPLANT;  Surgeon: Sanda Klein, MD;  Location: [place] [facility];  Service: Cardiovascular;  Laterality: N/A;  . Loop recorder explant N/A 10/04/2014    Procedure: LOOP RECORDER EXPLANT;  Surgeon: Sanda Klein, MD;  Location: [facility];  Service: Cardiovascular;  Laterality: N/A;  . Permanent pacemaker insertion N/A 10/04/2014    Procedure: PERMANENT PACEMAKER INSERTION;  Surgeon: Sanda Klein, MD;  Location: [facility];  Service: Cardiovascular;  Laterality: N/A;     Current Outpatient Prescriptions  Medication Sig Dispense Refill  . acetaminophen (TYLENOL) 325 MG tablet Take 325 mg by mouth daily as needed (pain).    Marland Kitchen apixaban (ELIQUIS) 5 MG TABS tablet Take 1 tablet (5 mg total) by mouth 2 (two) times daily. HOLD for now, resume with 11/05 PM dose. 180 tablet 3  . Biotin 5000 MCG CAPS Take 5,000 mcg by mouth daily.     . Calcium Carbonate-Vit D-Min (CALCIUM 1200 PO) Take 2,400 mg by mouth daily.     . cholecalciferol (VITAMIN D) 1000 UNITS tablet  Take 1,000 Units by mouth daily.     . ciprofloxacin (CIPRO) 250 MG tablet Take 250 mg by mouth See admin instructions. Take 1 tablet (250 mg) twice daily for 3 days as need for bladder infections    . Co-Enzyme Q10 100 MG CAPS Take 100 mg by mouth daily.     . diazepam (VALIUM) 5 MG tablet Take 5 mg by mouth daily as needed for anxiety.     . diphenhydrAMINE (BENADRYL) 50 MG tablet Take 0.5-1 tablets (25-50 mg total) by mouth every 6 (six) hours as needed for itching. 30 tablet 0  . estradiol (ESTRACE) 0.1 MG/GM vaginal cream Place 1 Applicatorful vaginally 3 (three) times a week.     . folic acid (FOLVITE) 951 MCG tablet Take 400 mcg by mouth daily.    .  Glucosamine-Chondroit-Vit C-Mn (GLUCOSAMINE CHONDR 1500 COMPLX PO) Take 1,500 mg by mouth daily.     . hydrocortisone 1 % lotion Apply 1 application topically 2 (two) times daily. 118 mL 0  . loratadine (KLS ALLERCLEAR) 10 MG tablet Take 10 mg by mouth daily.    Marland Kitchen MAGNESIUM CITRATE PO Take 250 mg by mouth daily. 1 tab daily    . meclizine (ANTIVERT) 25 MG tablet Take 25 mg by mouth daily as needed for dizziness.     . Menthol, Topical Analgesic, (BIOFREEZE EX) Apply 1 application topically daily as needed (hip pain).    . Resveratrol 250 MG CAPS Take 250 mg by mouth daily.    . sertraline (ZOLOFT) 50 MG tablet Take 50 mg by mouth daily.     . Simethicone (GAS-X PO) Take 2 tablets by mouth daily as needed (bloating).    . vitamin E (VITAMIN E) 400 UNIT capsule Take 400 Units by mouth daily.     No current facility-administered medications for this visit.    Allergies:   Ancef and Caffeine    Social History:  The patient  reports that she has quit smoking. Her smoking use included Cigarettes. She has a 36 pack-year smoking history. She has never used smokeless tobacco. She reports that she drinks alcohol. She reports that she does not use illicit drugs.   Family History:  The patient's family history includes Heart failure in her brother and mother; Stroke in her father.    ROS:  Please see the history of present illness.     Mild (improved) fatigue and dyspnea. The patient specifically denies any chest pain at rest or with exertion, orthopnea, paroxysmal nocturnal dyspnea, new focal neurological deficits, intermittent claudication, lower extremity edema, unexplained weight gain, cough, hemoptysis or wheezing.  The patient also denies abdominal pain, nausea, vomiting, dysphagia, diarrhea, constipation, polyuria, polydipsia, dysuria, hematuria, frequency, urgency, abnormal bleeding or bruising, fever, chills, unexpected weight changes, mood swings, change in skin or hair texture, change in  voice quality, auditory or visual problems, allergic reactions or rashes, new musculoskeletal complaints other than usual "aches and pains".herwise, review of systems are positive for none.   All other systems are reviewed and negative.    PHYSICAL EXAM: VS:  BP 107/70 mmHg  Pulse 79  Resp 16  Ht _0  (1.702 m)  Wt 215 lb 12.8 oz (97.886 kg)  BMI 33.79 kg/m2 , BMI Body mass index is 33.79 kg/(m^2). General: Alert, oriented x3, no distress  Head: no evidence of trauma, PERRL, EOMI, no exophtalmos or lid lag, no myxedema, no xanthelasma; normal ears, nose and oropharynx  Neck: normal jugular venous pulsations and no hepatojugular  reflux; brisk carotid pulses without delay and no carotid bruits  Chest: clear to auscultation, no signs of consolidation by percussion or palpation, normal fremitus, symmetrical and full respiratory excursions ; healthy loop recorder scar and pacemaker site. Cardiovascular: normal position and quality of the apical impulse, regular rhythm, normal first and second heart sounds, no murmurs, rubs or gallops  Abdomen: no tenderness or distention, no masses by palpation, no abnormal pulsatility or arterial bruits, normal bowel sounds, no hepatosplenomegaly  Extremities: no clubbing, cyanosis or edema; 2+ radial, ulnar and brachial pulses bilaterally; 2+ right femoral, posterior tibial and dorsalis pedis pulses; 2+ left femoral, posterior tibial and dorsalis pedis pulses; no subclavian or femoral bruits  Neurological: grossly nonfocal Psych: euthymic mood, full affect   EKG:  EKG is not ordered today.    Recent Labs: 06/06/2014: TSH 1.529 09/28/2014: ALT 15; BUN 18; Creatinine 0.86; Hemoglobin 14.7; Platelets 252; Potassium 4.6; Sodium 139    Lipid Panel    Component Value Date/Time   CHOL 152 03/09/2014 0440   TRIG 87 03/09/2014 0440   HDL 58 03/09/2014 0440   CHOLHDL 2.6 03/09/2014 0440   VLDL 17 03/09/2014 0440   LDLCALC 77 03/09/2014 0440      Wt  Readings from Last 3 Encounters:  01/31/15 215 lb 12.8 oz (97.886 kg)  10/25/14 207 lb 1.6 oz (93.94 kg)  10/13/14 205 lb 4.8 oz (93.123 kg)     ASSESSMENT AND PLAN:  Burden of atrial arrhythmia is low. Indication for digoxin is marginal. Now that there is backup pacing there are better choices for AV node blocking agents without worrying about bradycardia. Stop digoxin.  Normal pacemaker function.  Symptoms suggest OSA. Loud snorer. Schedule outpatient sleep study.  If she needs gynecological procedures, stop Eliquis for 48 hours before the procedure and resume as soon as considered safe by the gynecologist (depending on bleeding risk of procedure).   Current medicines are reviewed at length with the patient today.  The patient does not have concerns regarding medicines.  The following changes have been made:  Stop digoxin  Labs/ tests ordered today include:  Orders Placed This Encounter  Procedures  . Implantable device check  . Split night study   Patient Instructions  STOP the Digoxin.  Your physician has recommended that you have a sleep study. This test records several body functions during sleep, including: brain activity, eye movement, oxygen and carbon dioxide blood levels, heart rate and rhythm, breathing rate and rhythm, the flow of air through your mouth and nose, snoring, body muscle movements, and chest and belly movement.  This study will be scheduled at [facility].  Dr. Sallyanne Kuster recommends that you schedule a follow-up appointment in: 3 months with Medtronic pacemaker check (blue).      Mikael Spray, MD  02/07/2015 5:02 PM    [facility] [address], [place], [place]  [address] Phone: [phone]; Fax: [phone]

## 2015-01-31 NOTE — Patient Instructions (Addendum)
STOP the Digoxin.  Your physician has recommended that you have a sleep study. This test records several body functions during sleep, including: brain activity, eye movement, oxygen and carbon dioxide blood levels, heart rate and rhythm, breathing rate and rhythm, the flow of air through your mouth and nose, snoring, body muscle movements, and chest and belly movement.  This study will be scheduled at [facility].  Dr. Sallyanne Kuster recommends that you schedule a follow-up appointment in: 3 months with Medtronic pacemaker check (blue).

## 2015-02-02 DIAGNOSIS — N950 Postmenopausal bleeding: Secondary | ICD-10-CM | POA: Diagnosis not present

## 2015-02-07 ENCOUNTER — Encounter: Payer: Self-pay | Admitting: Cardiovascular Disease

## 2015-02-07 LAB — MDC_IDC_ENUM_SESS_TYPE_INCLINIC
Battery Remaining Longevity: 132 mo
Battery Voltage: 3.05 V
Brady Statistic AP VP Percent: 0.04 %
Brady Statistic AS VS Percent: 37.98 %
Brady Statistic RA Percent Paced: 62 %
Brady Statistic RV Percent Paced: 0.06 %
Lead Channel Impedance Value: 570 Ohm
Lead Channel Pacing Threshold Pulse Width: 0.4 ms
Lead Channel Sensing Intrinsic Amplitude: 5 mV
Lead Channel Sensing Intrinsic Amplitude: 5 mV
Lead Channel Setting Pacing Amplitude: 3.5 V
Lead Channel Setting Pacing Amplitude: 3.5 V
Lead Channel Setting Pacing Pulse Width: 0.4 ms
Lead Channel Setting Sensing Sensitivity: 2.8 mV
MDC IDC MSMT LEADCHNL RA IMPEDANCE VALUE: 399 Ohm
MDC IDC MSMT LEADCHNL RA IMPEDANCE VALUE: 532 Ohm
MDC IDC MSMT LEADCHNL RA PACING THRESHOLD AMPLITUDE: 0.75 V
MDC IDC MSMT LEADCHNL RV IMPEDANCE VALUE: 475 Ohm
MDC IDC MSMT LEADCHNL RV PACING THRESHOLD AMPLITUDE: 0.5 V
MDC IDC MSMT LEADCHNL RV PACING THRESHOLD PULSEWIDTH: 0.4 ms
MDC IDC MSMT LEADCHNL RV SENSING INTR AMPL: 7.5 mV
MDC IDC MSMT LEADCHNL RV SENSING INTR AMPL: 7.5 mV
MDC IDC SESS DTM: 20160301174007
MDC IDC SET ZONE DETECTION INTERVAL: 400 ms
MDC IDC STAT BRADY AP VS PERCENT: 61.96 %
MDC IDC STAT BRADY AS VP PERCENT: 0.02 %
Zone Setting Detection Interval: 400 ms

## 2015-02-08 ENCOUNTER — Other Ambulatory Visit: Payer: Self-pay | Admitting: Obstetrics and Gynecology

## 2015-02-08 DIAGNOSIS — N719 Inflammatory disease of uterus, unspecified: Secondary | ICD-10-CM | POA: Diagnosis not present

## 2015-02-08 DIAGNOSIS — N950 Postmenopausal bleeding: Secondary | ICD-10-CM | POA: Diagnosis not present

## 2015-02-08 DIAGNOSIS — N8500 Endometrial hyperplasia, unspecified: Secondary | ICD-10-CM | POA: Diagnosis not present

## 2015-02-08 DIAGNOSIS — R938 Abnormal findings on diagnostic imaging of other specified body structures: Secondary | ICD-10-CM | POA: Diagnosis not present

## 2015-02-09 ENCOUNTER — Telehealth: Payer: Self-pay | Admitting: Cardiovascular Disease

## 2015-02-09 ENCOUNTER — Encounter: Payer: Self-pay | Admitting: Cardiovascular Disease

## 2015-02-09 NOTE — Telephone Encounter (Signed)
New message       1. Has your device fired? no 2. Is you device beeping? Yes----buzzing  3. Are you experiencing draining or swelling at device site? no  4. Are you calling to see if we received your device transmission? no  5. Have you passed out? no

## 2015-02-09 NOTE — Telephone Encounter (Signed)
Called pt and instructed her to send manual transmission. Pt had a D&C on Thursday. They did not use a magnet. Transmission sent successfully will have a device tech look at it and call pt back.

## 2015-02-09 NOTE — Telephone Encounter (Signed)
Pt having intermittent buzzing sensation. She further describes the sensation as a butterfly coming from/near her pacemaker. She sent a manual transmission. Remote shows very brief episodes of PAF--- estimated ~14mn in duration; <0.1% of time overall. I explained it appears she is having brief AF or Afl episodes. Pt aware her device will not intervene during PAF (atrial ATP currently deactivated). I advised pt to continue to her normal lifestyle routine of exercise, etc. She wanted advice on an ablation. I defined it briefly and explained that would be better discussed w/ Dr. CLoletha Grayer  Pt requested I update Dr. CSallyanne Kuster I let her know she would be contacted only if necessary prior to her appt. Otherwise, we will see her in June.   Pt does take her eliquis as instructed.  Pt aware ROV w/ Dr. CSallyanne Kuster6/7/16.

## 2015-02-13 ENCOUNTER — Telehealth: Payer: Self-pay | Admitting: *Deleted

## 2015-02-13 NOTE — Telephone Encounter (Signed)
LMOVM w/ my direct #.  Re: update from Dr. Loletha Grayer that recent remote was acceptable  (no need to call back unless she desires)

## 2015-02-27 DIAGNOSIS — Z1231 Encounter for screening mammogram for malignant neoplasm of breast: Secondary | ICD-10-CM | POA: Diagnosis not present

## 2015-03-20 ENCOUNTER — Telehealth: Payer: Self-pay | Admitting: Cardiovascular Disease

## 2015-03-20 MED ORDER — APIXABAN 5 MG PO TABS: 5.0000 mg | ORAL_TABLET | Freq: Two times a day (BID) | ORAL | Status: DC

## 2015-03-20 NOTE — Telephone Encounter (Signed)
Refill submitted to patient's preferred pharmacy. Informed patient. Pt voiced understanding, no other stated concerns at this time.

## 2015-03-20 NOTE — Telephone Encounter (Signed)
1. Which medications need to be refilled? Eliquis  2. Which pharmacy is medication to be sent to?[facility] on [address]  3. Do they need a 30 day or 90 day supply? 90 and refills 4. Would they like a call back once the medication has been sent to the pharmacy? yes

## 2015-03-21 NOTE — Telephone Encounter (Signed)
Closed encounter °

## 2015-03-27 ENCOUNTER — Telehealth: Payer: Self-pay | Admitting: Cardiovascular Disease

## 2015-03-27 NOTE — Telephone Encounter (Signed)
Spoke with pt, she describes a "twinge" of pain under the left breast that comes and goes. It has been happening for the last 2 to 3 days, once or twice a day. She can not really relate it to anything and it goes away so fast she can not describe it. It is not related to exertion and she can not reproduce the twinge. Reassurance given to the patient. She will call back if the symptoms change or worsen. Pt agreed with this plan.

## 2015-03-27 NOTE — Telephone Encounter (Signed)
Pt is having a twinging in her left side,nerve had this before. Please call,pt is concerned.

## 2015-04-07 ENCOUNTER — Encounter (HOSPITAL_BASED_OUTPATIENT_CLINIC_OR_DEPARTMENT_OTHER): Payer: Medicare Other

## 2015-04-24 ENCOUNTER — Telehealth: Payer: Self-pay | Admitting: Internal Medicine

## 2015-04-24 ENCOUNTER — Encounter (HOSPITAL_BASED_OUTPATIENT_CLINIC_OR_DEPARTMENT_OTHER): Payer: Medicare Other

## 2015-04-24 NOTE — Telephone Encounter (Signed)
Patient called concerned about having accidentally taken 4 melatonin capsules instead of cranberry pills and was concerned about overdose.  I reassured her that there should be no problem with this and that if anything changed to feel free to call back.

## 2015-04-25 DIAGNOSIS — J370 Chronic laryngitis: Secondary | ICD-10-CM | POA: Diagnosis not present

## 2015-04-25 DIAGNOSIS — J309 Allergic rhinitis, unspecified: Secondary | ICD-10-CM | POA: Diagnosis not present

## 2015-04-26 DIAGNOSIS — N644 Mastodynia: Secondary | ICD-10-CM | POA: Diagnosis not present

## 2015-05-09 ENCOUNTER — Ambulatory Visit (INDEPENDENT_AMBULATORY_CARE_PROVIDER_SITE_OTHER): Payer: Medicare Other | Admitting: Cardiovascular Disease

## 2015-05-09 ENCOUNTER — Encounter: Payer: Self-pay | Admitting: Cardiovascular Disease

## 2015-05-09 VITALS — BP 111/70 | HR 83 | Resp 16 | Ht 67.0 in | Wt 224.4 lb

## 2015-05-09 DIAGNOSIS — I10 Essential (primary) hypertension: Secondary | ICD-10-CM

## 2015-05-09 DIAGNOSIS — G459 Transient cerebral ischemic attack, unspecified: Secondary | ICD-10-CM | POA: Diagnosis not present

## 2015-05-09 DIAGNOSIS — I455 Other specified heart block: Secondary | ICD-10-CM

## 2015-05-09 DIAGNOSIS — I480 Paroxysmal atrial fibrillation: Secondary | ICD-10-CM | POA: Diagnosis not present

## 2015-05-09 DIAGNOSIS — I495 Sick sinus syndrome: Secondary | ICD-10-CM

## 2015-05-09 LAB — CUP PACEART INCLINIC DEVICE CHECK
Battery Remaining Longevity: 130 mo
Battery Voltage: 3.04 V
Brady Statistic AP VS Percent: 60 %
Brady Statistic AS VS Percent: 39.92 %
Brady Statistic RV Percent Paced: 0.08 %
Date Time Interrogation Session: 20160607135404
Lead Channel Impedance Value: 437 Ohm
Lead Channel Impedance Value: 475 Ohm
Lead Channel Impedance Value: 494 Ohm
Lead Channel Pacing Threshold Pulse Width: 0.4 ms
Lead Channel Sensing Intrinsic Amplitude: 4.5 mV
Lead Channel Sensing Intrinsic Amplitude: 4.5 mV
Lead Channel Sensing Intrinsic Amplitude: 6.5 mV
Lead Channel Setting Pacing Amplitude: 1.5 V
Lead Channel Setting Pacing Amplitude: 2 V
MDC IDC MSMT LEADCHNL RA IMPEDANCE VALUE: 399 Ohm
MDC IDC MSMT LEADCHNL RA PACING THRESHOLD AMPLITUDE: 0.75 V
MDC IDC MSMT LEADCHNL RV PACING THRESHOLD AMPLITUDE: 0.625 V
MDC IDC MSMT LEADCHNL RV PACING THRESHOLD PULSEWIDTH: 0.4 ms
MDC IDC MSMT LEADCHNL RV SENSING INTR AMPL: 6.5 mV
MDC IDC SET LEADCHNL RV PACING PULSEWIDTH: 0.4 ms
MDC IDC SET LEADCHNL RV SENSING SENSITIVITY: 2.8 mV
MDC IDC SET ZONE DETECTION INTERVAL: 400 ms
MDC IDC SET ZONE DETECTION INTERVAL: 400 ms
MDC IDC STAT BRADY AP VP PERCENT: 0.06 %
MDC IDC STAT BRADY AS VP PERCENT: 0.02 %
MDC IDC STAT BRADY RA PERCENT PACED: 60.06 %

## 2015-05-09 NOTE — Patient Instructions (Signed)
Remote monitoring is used to monitor your Pacemaker or ICD from home. This monitoring reduces the number of office visits required to check your device to one time per year. It allows [place] to monitor the functioning of your device to ensure it is working properly. You are scheduled for a device check from home on August 10, 2015. You may send your transmission at any time that day. If you have a wireless device, the transmission will be sent automatically. After your physician reviews your transmission, you will receive a postcard with your next transmission date.  Your physician recommends that you schedule a follow-up appointment in: 6 months.

## 2015-05-09 NOTE — Progress Notes (Signed)
[name], female   DOB: 08/12/1941, 74 y.o.   MRN: [id number]     Cardiology Office Note   Date:  05/09/2015   ID:  [name] [name], DOB 1941/01/16, MRN [id number]  PCP:  Reginia Naas, MD  Cardiologist:   Sanda Klein, MD   Chief Complaint  Patient presents with  . ROV 3 months    patient reports shortness of breath with minimal exertion.      History of Present Illness: [name] [name] is a 74 y.o. female who presents for follow-up of paroxysmal atrial fibrillation and sinus node dysfunction status post implantation of a dual-chamber permanent pacemaker (Medtronic advisa November 2015). She is tolerating anticoagulation with Ellik was without any bleeding side effects. She has a relatively recent history of transient ischemic attack which led to implantation of her loop recorder and ultimately the diagnosis of atrial fibrillation. No new neurological events have occurred since the index event.   Interrogation of her pacemaker shows normal device function and infrequent episodes of paroxysmal atrial tachycardia. The longest lasted for just a couple of seconds, roughly 10 beats. No new episodes of atrial fibrillation have been detected. Frequent premature atrial complexes are noted during device interrogation. She has very rare ventricular pacing. Battery longevity is estimated at 10.5 years.  She complains of shortness of breath with light exertion. She has gained weight. Relatively recent echocardiography showed normal left ventricular systolic function and minimal signs of diastolic dysfunction. Heart rate histograms do not suggest chronotropic incompetence.    Past Medical History  Diagnosis Date  . Hypertension   . MVP (mitral valve prolapse)   . PVC's (premature ventricular contractions)   . Obesity   . Presence of permanent cardiac pacemaker   . Complication of anesthesia     "I woke up during 2 different procedures" (10/04/2014)  . Atrial fibrillation     . Stroke 01/2014    denies deficits on 10/04/2014  . GERD (gastroesophageal reflux disease)   . Arthritis     "all over"  . Frequent UTI   . 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 Sinus arrest 10/2014    s/p Medtronic Advisa model [id number] serial number [id number]    Past Surgical History  Procedure Laterality Date  . Excision vaginal cyst      benign nodules  . Tubal ligation    . US echocardiography  05/15/2010    LA mildly dilated,mild mitral annular ca+, AOV mildly sclerotic, mild asymmetric LVH  . Nm myocar perf wall motion  12/18/2007    normal  . Insert / replace / remove pacemaker  10/04/2014    Medtronic Advisa model [id number] serial number [id number]  . Loop recorder implant N/A 04/19/2014    Procedure: LOOP RECORDER IMPLANT;  Surgeon: Sanda Klein, MD;  Location: [place] [facility];  Service: Cardiovascular;  Laterality: N/A;  . Loop recorder explant N/A 10/04/2014    Procedure: LOOP RECORDER EXPLANT;  Surgeon: Sanda Klein, MD;  Location: [place] [facility];  Service: Cardiovascular;  Laterality: N/A;  . Permanent pacemaker insertion N/A 10/04/2014    Procedure: PERMANENT PACEMAKER INSERTION;  Surgeon: Sanda Klein, MD;  Location: [place] [facility];  Service: Cardiovascular;  Laterality: N/A;     Current Outpatient Prescriptions  Medication Sig Dispense Refill  . acetaminophen (TYLENOL) 325 MG tablet Take 325 mg by mouth daily as needed (pain).    Marland Kitchen apixaban (ELIQUIS) 5 MG TABS tablet Take 1 tablet (5 mg total) by mouth 2 (two) times daily. 180 tablet  3  . Biotin 5000 MCG CAPS Take 5,000 mcg by mouth daily.     . Calcium Carbonate-Vit D-Min (CALCIUM 1200 PO) Take 2,400 mg by mouth daily.     . cholecalciferol (VITAMIN D) 1000 UNITS tablet Take 1,000 Units by mouth daily.     . ciprofloxacin (CIPRO) 250 MG tablet Take 250 mg by mouth See admin instructions. Take 1 tablet (250 mg) twice daily for 3 days as need for bladder infections    . Co-Enzyme Q10 100 MG CAPS Take 100 mg by mouth daily.     .  diazepam (VALIUM) 5 MG tablet Take 5 mg by mouth as needed for anxiety.     . diphenhydrAMINE (BENADRYL) 50 MG tablet Take 0.5-1 tablets (25-50 mg total) by mouth every 6 (six) hours as needed for itching. 30 tablet 0  . folic acid (FOLVITE) 607 MCG tablet Take 400 mcg by mouth daily.    . Glucosamine-Chondroit-Vit C-Mn (GLUCOSAMINE CHONDR 1500 COMPLX PO) Take 1,500 mg by mouth daily.     . hydrocortisone cream 1 % Apply 1 application topically as needed for itching.    . loratadine (KLS ALLERCLEAR) 10 MG tablet Take 10 mg by mouth daily.    Marland Kitchen MAGNESIUM CITRATE PO Take 250 mg by mouth daily. 1 tab daily    . meclizine (ANTIVERT) 25 MG tablet Take 25 mg by mouth as needed for dizziness.     . Menthol, Topical Analgesic, (BIOFREEZE EX) Apply 1 application topically as needed (hip pain).     . Resveratrol 250 MG CAPS Take 250 mg by mouth daily.    . sertraline (ZOLOFT) 50 MG tablet Take 50 mg by mouth daily.     . Simethicone (GAS-X PO) Take 2 tablets by mouth daily as needed (bloating).    . vitamin E (VITAMIN E) 400 UNIT capsule Take 400 Units by mouth daily.     No current facility-administered medications for this visit.    Allergies:   Ancef and Caffeine    Social History:  The patient  reports that she has quit smoking. Her smoking use included Cigarettes. She has a 36 pack-year smoking history. She has never used smokeless tobacco. She reports that she drinks alcohol. She reports that she does not use illicit drugs.   Family History:  The patient's family history includes Heart failure in her brother and mother; Stroke in her father.    ROS:  Please see the history of present illness.    Otherwise, review of systems positive for none.   All other systems are reviewed and negative.    PHYSICAL EXAM: VS:  BP 111/70 mmHg  Pulse 83  Resp 16  Ht _0  (1.702 m)  Wt 101.787 kg (224 lb 6.4 oz)  BMI 35.14 kg/m2 , BMI Body mass index is 35.14 kg/(m^2).  General: Alert, oriented x3,  no distress Head: no evidence of trauma, PERRL, EOMI, no exophtalmos or lid lag, no myxedema, no xanthelasma; normal ears, nose and oropharynx Neck: normal jugular venous pulsations and no hepatojugular reflux; brisk carotid pulses without delay and no carotid bruits Chest: clear to auscultation, no signs of consolidation by percussion or palpation, normal fremitus, symmetrical and full respiratory excursions Cardiovascular: normal position and quality of the apical impulse, regular rhythm, normal first and second heart sounds, no murmurs, rubs or gallops Abdomen: no tenderness or distention, no masses by palpation, no abnormal pulsatility or arterial bruits, normal bowel sounds, no hepatosplenomegaly Extremities: no clubbing, cyanosis or edema;  2+ radial, ulnar and brachial pulses bilaterally; 2+ right femoral, posterior tibial and dorsalis pedis pulses; 2+ left femoral, posterior tibial and dorsalis pedis pulses; no subclavian or femoral bruits Neurological: grossly nonfocal Psych: euthymic mood, full affect   EKG:  EKG is ordered today. The ekg ordered today demonstrates atrial paced ventricular sensed rhythm   Recent Labs: 06/06/2014: TSH 1.529 09/28/2014: ALT 15; BUN 18; Creatinine 0.86; Hemoglobin 14.7; Platelets 252; Potassium 4.6; Sodium 139    Lipid Panel    Component Value Date/Time   CHOL 152 03/09/2014 0440   TRIG 87 03/09/2014 0440   HDL 58 03/09/2014 0440   CHOLHDL 2.6 03/09/2014 0440   VLDL 17 03/09/2014 0440   LDLCALC 77 03/09/2014 0440      Wt Readings from Last 3 Encounters:  05/09/15 101.787 kg (224 lb 6.4 oz)  01/31/15 97.886 kg (215 lb 12.8 oz)  10/25/14 93.94 kg (207 lb 1.6 oz)     ASSESSMENT AND PLAN:  1. Paroxysmal atrial fibrillation with very infrequent events but with previous history of probable transient ischemic attack. CHADSVasc 4 (will actually be a score of 5 in November after her next birthday). Continue anticoagulation. She does have some  bursts of paroxysmal atrial tachycardia. Antiarrhythmics/rate control medications do not appear to be justified since her events are so infrequent and essentially asymptomatic. 2. Normal function of dual-chamber permanent pacemaker implanted for tachycardia-bradycardia syndrome. No programming changes were necessary today.   Current medicines are reviewed at length with the patient today.  The patient does not have concerns regarding medicines.  The following changes have been made:  no change  Labs/ tests ordered today include:  Orders Placed This Encounter  Procedures  . EKG 12-Lead    Patient Instructions  Remote monitoring is used to monitor your Pacemaker or ICD from home. This monitoring reduces the number of office visits required to check your device to one time per year. It allows [place] to monitor the functioning of your device to ensure it is working properly. You are scheduled for a device check from home on August 10, 2015. You may send your transmission at any time that day. If you have a wireless device, the transmission will be sent automatically. After your physician reviews your transmission, you will receive a postcard with your next transmission date.  Your physician recommends that you schedule a follow-up appointment in: 6 months.      Mikael Spray, MD  05/09/2015 1:42 PM    Sanda Klein, MD, [facility] [phone] office [phone] pager

## 2015-05-10 ENCOUNTER — Other Ambulatory Visit: Payer: Self-pay | Admitting: Obstetrics & Gynecology

## 2015-05-10 DIAGNOSIS — Z6835 Body mass index (BMI) 35.0-35.9, adult: Secondary | ICD-10-CM | POA: Diagnosis not present

## 2015-05-10 DIAGNOSIS — Z124 Encounter for screening for malignant neoplasm of cervix: Secondary | ICD-10-CM | POA: Diagnosis not present

## 2015-05-10 DIAGNOSIS — Z01419 Encounter for gynecological examination (general) (routine) without abnormal findings: Secondary | ICD-10-CM | POA: Diagnosis not present

## 2015-05-11 LAB — CYTOLOGY - PAP

## 2015-05-26 ENCOUNTER — Encounter: Payer: Self-pay | Admitting: Cardiovascular Disease

## 2015-06-28 ENCOUNTER — Telehealth: Payer: Self-pay | Admitting: Cardiovascular Disease

## 2015-06-28 DIAGNOSIS — Z8673 Personal history of transient ischemic attack (TIA), and cerebral infarction without residual deficits: Secondary | ICD-10-CM

## 2015-06-28 DIAGNOSIS — R5383 Other fatigue: Secondary | ICD-10-CM

## 2015-06-28 NOTE — Telephone Encounter (Signed)
Pt called in wanting to speak with a nurse about some stroke -like symptoms. She states that she had a stroke about a year ago and wanted to ask some questions about being able to recognize what the symptoms are. Please call  Thanks

## 2015-06-28 NOTE — Telephone Encounter (Signed)
Left message - advised for stroke-like symptoms go to ER - o/w can f/u w/ [place] - left callback number.

## 2015-06-29 NOTE — Telephone Encounter (Signed)
Patient states she has not received a call back from yesterday

## 2015-06-29 NOTE — Telephone Encounter (Signed)
Please call home phone number

## 2015-06-29 NOTE — Telephone Encounter (Signed)
Patient reports recent feelings of fatigue and swimmy-headedness for a couple of weeks now. States she feels like something is not quite right but she is unsure what it is, although she says it reminds her of her recovery from a previous stroke (March 2015). Denies tingling, numbness, facial droop, slurred speech, or other s/s stroke. Patient was able to verbalize s/s of stroke. Patient states she has life alert button and would call 911 if she felt like she was having sx.  Patient previously seen by Dr. Leonie Man, neurology. Patient has not seen him recently.  Patient prefers to go to another neurologist and would like Dr. Loletha Grayer to recommend someone to her that can provide her neurology care. Patient would also like appointment with Dr. Loletha Grayer - scheduled for mid-August. Routing message to Dr. Loletha Grayer for his recommendation for Neurology. Reiterated to patient that should she experience any sx of possible stroke to call 911 and be seen in ED. Patient verbalized understanding and agreement. Patient does take Eliquis.

## 2015-07-03 NOTE — Telephone Encounter (Signed)
LMTCB

## 2015-07-03 NOTE — Telephone Encounter (Signed)
Patient notified that referral to [facility] Neurology, Dr. Carles Collet, has been placed. She was given the phone number for this office.

## 2015-07-03 NOTE — Telephone Encounter (Signed)
Please refer to [facility] Neurology

## 2015-07-03 NOTE — Telephone Encounter (Signed)
Pt says she is still waiting to hear something.

## 2015-07-14 ENCOUNTER — Ambulatory Visit (INDEPENDENT_AMBULATORY_CARE_PROVIDER_SITE_OTHER): Payer: Medicare Other | Admitting: Cardiovascular Disease

## 2015-07-14 ENCOUNTER — Telehealth: Payer: Self-pay | Admitting: *Deleted

## 2015-07-14 ENCOUNTER — Encounter: Payer: Self-pay | Admitting: Cardiovascular Disease

## 2015-07-14 VITALS — BP 128/70 | HR 80 | Ht 67.0 in | Wt 228.0 lb

## 2015-07-14 DIAGNOSIS — I495 Sick sinus syndrome: Secondary | ICD-10-CM | POA: Diagnosis not present

## 2015-07-14 DIAGNOSIS — I471 Supraventricular tachycardia: Secondary | ICD-10-CM

## 2015-07-14 DIAGNOSIS — I455 Other specified heart block: Secondary | ICD-10-CM

## 2015-07-14 MED ORDER — VERAPAMIL HCL 120 MG PO TABS: 120.0000 mg | ORAL_TABLET | Freq: Three times a day (TID) | ORAL | Status: DC

## 2015-07-14 NOTE — Patient Instructions (Addendum)
Your physician wants you to follow-up in: 1 Year. You will receive a reminder letter in the mail two months in advance. If you don't receive a letter, please call our office to schedule the follow-up appointment.  Remote monitoring is used to monitor your Pacemaker of ICD from home. This monitoring reduces the number of office visits required to check your device to one time per year. It allows [place] to keep an eye on the functioning of your device to ensure it is working properly. You are scheduled for a device check from home on 10/03/2015. You may send your transmission at any time that day. If you have a wireless device, the transmission will be sent automatically. After your physician reviews your transmission, you will receive a postcard with your next transmission date.  Start verapamil SR 120 mg daily.  Possible side effects include ankle swelling, constipation or dizziness weakness due to low blood pressure. Please call us if he develop any of these complaints.

## 2015-07-14 NOTE — Telephone Encounter (Signed)
Left message that a new AVS- SUMMARY FROM 07/14/15 WAS MAILED TO HER WITH NEW MEDICATION INSTRUCTION FOR VERAPMIL CAN CALL BACK IF NEEDED

## 2015-07-14 NOTE — Progress Notes (Signed)
[name], female   DOB: Nov 06, 1941, 74 y.o.   MRN: [id number]      Cardiology Office Note   Date:  07/14/2015   ID:  GWYNN CHALKER, DOB 05/30/1941, MRN [id number]  PCP:  Reginia Naas, MD  Cardiologist:   Sanda Klein, MD   Chief Complaint  Patient presents with  . pacer check    Patient has felt light headed, dizzy, and SOB.      History of Present Illness: RASHAUNDA [name] is a 74 y.o. female who presents for "symptoms of stroke". This happened on July 24,  around 10:00 in the morning in church.  It only lasted for several seconds. She has had a few other milder episodes before and sense.  The symptoms are a poorly described uneasy sensation in her head, reminiscent of her hospitalization for transient ischemic attack last year. That previous event to implantation of a loop recorder that identified recurrent episodes of paroxysmal atrial tachycardia, but also at least one episode of atrial fibrillation.  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    interrogation of her pacemaker shows that she had a brief episode of nonsustained atrial tachycardia with a 1-1 atrioventricular conduction at about 175 bpm around the time of her symptoms. She has not had any episodes of sustained atrial tachycardia or atrial fibrillation since her last pacemaker check. She is taking her Eliquis faithfully and has not had bleeding events or any true focal neurological problems. She is not taking any rate control medications since her blood pressure tends to run low.  Past Medical History  Diagnosis Date  . Hypertension   . MVP (mitral valve prolapse)   . PVC's (premature ventricular contractions)   . Obesity   . Presence of permanent cardiac pacemaker   . Complication of anesthesia     "I woke up during 2 different procedures" (10/04/2014)  . Atrial fibrillation   . Stroke 01/2014    denies  deficits on 10/04/2014  . GERD (gastroesophageal reflux disease)   . Arthritis     "all over"  . Frequent UTI   . 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 Sinus arrest 10/2014    s/p Medtronic Advisa model [id number] serial number [id number]    Past Surgical History  Procedure Laterality Date  . Excision vaginal cyst      benign nodules  . Tubal ligation    . US echocardiography  05/15/2010    LA mildly dilated,mild mitral annular ca+, AOV mildly sclerotic, mild asymmetric LVH  . Nm myocar perf wall motion  12/18/2007    normal  . Insert / replace / remove pacemaker  10/04/2014    Medtronic Advisa model A2DR01 serial number [id number]  . Loop recorder implant N/A 04/19/2014    Procedure: LOOP RECORDER IMPLANT;  Surgeon: Sanda Klein, MD;  Location: [place] CATH LAB;  Service: Cardiovascular;  Laterality: N/A;  . Loop recorder explant N/A 10/04/2014    Procedure: LOOP RECORDER EXPLANT;  Surgeon: Sanda Klein, MD;  Location: [place] CATH [facility];  Service: Cardiovascular;  Laterality: N/A;  . Permanent pacemaker insertion N/A 10/04/2014    Procedure: PERMANENT PACEMAKER INSERTION;  Surgeon: Sanda Klein, MD;  Location: [place] C[facility];  Service: Cardiovascular;  Laterality: N/A;     Current Outpatient Prescriptions  Medication Sig Dispense Refill  . acetaminophen (TYLENOL) 325 MG tablet Take 325 mg by mouth daily as needed (pain).    Marland Kitchen apixaban (ELIQUIS) 5 MG TABS tablet  Take 1 tablet (5 mg total) by mouth 2 (two) times daily. 180 tablet 3  . Biotin 5000 MCG CAPS Take 5,000 mcg by mouth daily.     . Calcium Carbonate-Vit D-Min (CALCIUM 1200 PO) Take 2,400 mg by mouth daily.     . cholecalciferol (VITAMIN D) 1000 UNITS tablet Take 1,000 Units by mouth daily.     . ciprofloxacin (CIPRO) 250 MG tablet Take 250 mg by mouth See admin instructions. Take 1 tablet (250 mg) twice daily for 3 days as need for bladder infections    . Co-Enzyme Q10 100 MG CAPS Take 100 mg by mouth daily.     . diazepam (VALIUM) 5 MG tablet  Take 5 mg by mouth as needed for anxiety.     . diphenhydrAMINE (BENADRYL) 50 MG tablet Take 0.5-1 tablets (25-50 mg total) by mouth every 6 (six) hours as needed for itching. 30 tablet 0  . folic acid (FOLVITE) 631 MCG tablet Take 400 mcg by mouth daily.    . Glucosamine-Chondroit-Vit C-Mn (GLUCOSAMINE CHONDR 1500 COMPLX PO) Take 1,500 mg by mouth daily.     . hydrocortisone cream 1 % Apply 1 application topically as needed for itching.    . loratadine (KLS ALLERCLEAR) 10 MG tablet Take 10 mg by mouth daily.    Marland Kitchen LORazepam (ATIVAN) 0.5 MG tablet Take 0.5 mg by mouth as needed for anxiety.    Marland Kitchen MAGNESIUM CITRATE PO Take 250 mg by mouth daily. 1 tab daily    . meclizine (ANTIVERT) 25 MG tablet Take 25 mg by mouth as needed for dizziness.     . Menthol, Topical Analgesic, (BIOFREEZE EX) Apply 1 application topically as needed (hip pain).     . Resveratrol 250 MG CAPS Take 250 mg by mouth daily.    . sertraline (ZOLOFT) 50 MG tablet Take 50 mg by mouth daily.     . Simethicone (GAS-X PO) Take 2 tablets by mouth daily as needed (bloating).    . vitamin E (VITAMIN E) 400 UNIT capsule Take 400 Units by mouth daily.     No current facility-administered medications for this visit.    Allergies:   Ancef and Caffeine    Social History:  The patient  reports that she has quit smoking. Her smoking use included Cigarettes. She has a 36 pack-year smoking history. She has never used smokeless tobacco. She reports that she drinks alcohol. She reports that she does not use illicit drugs.   Family History:  The patient's family history includes Heart failure in her brother and mother; Stroke in her father.    ROS:  Please see the history of present illness.    Otherwise, review of systems positive for none.   She  Rashes, mood swings.specifically denies exertional dyspnea, angina, focal weakness or loss of sensation, visual or auditory disturbances, syncope, lower extremity edema, nausea or vomiting,  diarrhea or constipation, gastrointestinal bleeding, easy bruising or bleeding , Or Tenderness, Cough, Hemoptysis, Pleurisy , intolerance to heat or cold , polyuria, polydipsia, dysuria, frequency, urgency All other systems are reviewed and negative.    PHYSICAL EXAM: VS:  BP 128/70 mmHg  Pulse 80  Ht _0  (1.702 m)  Wt 228 lb (103.42 kg)  BMI 35.70 kg/m2 , BMI Body mass index is 35.7 kg/(m^2).  General: Alert, oriented x3, no distress Head: no evidence of trauma, PERRL, EOMI, no exophtalmos or lid lag, no myxedema, no xanthelasma; normal ears, nose and oropharynx Neck: normal jugular venous pulsations  and no hepatojugular reflux; brisk carotid pulses without delay and no carotid bruits Chest: clear to auscultation, no signs of consolidation by percussion or palpation, normal fremitus, symmetrical and full respiratory excursions,  Healthy left subclavian pacemaker site Cardiovascular: normal position and quality of the apical impulse, regular rhythm, normal first and second heart sounds, no murmurs, rubs or gallops Abdomen: no tenderness or distention, no masses by palpation, no abnormal pulsatility or arterial bruits, normal bowel sounds, no hepatosplenomegaly Extremities: no clubbing, cyanosis or edema; 2+ radial, ulnar and brachial pulses bilaterally; 2+ right femoral, posterior tibial and dorsalis pedis pulses; 2+ left femoral, posterior tibial and dorsalis pedis pulses; no subclavian or femoral bruits Neurological: grossly nonfocal Psych: euthymic mood, full affect   EKG:  EKG is not ordered today.   Recent Labs: 09/28/2014: ALT 15; BUN 18; Creat 0.86; Hemoglobin 14.7; Platelets 252; Potassium 4.6; Sodium 139    Lipid Panel    Component Value Date/Time   CHOL 152 03/09/2014 0440   TRIG 87 03/09/2014 0440   HDL 58 03/09/2014 0440   CHOLHDL 2.6 03/09/2014 0440   VLDL 17 03/09/2014 0440   LDLCALC 77 03/09/2014 0440      Wt Readings from Last 3 Encounters:  07/14/15 228 lb  (103.42 kg)  05/09/15 224 lb 6.4 oz (101.787 kg)  01/31/15 215 lb 12.8 oz (97.886 kg)     ASSESSMENT AND PLAN:  1.  Paroxysmal atrial tachycardia was probably the cause of her "stroke like symptoms". I think she is describing the equivalent of dizziness or near syncope. The episodes are very brief but quite fast at nearly 180 bpm. I recommended she start taking verapamil sustained release 180 mg daily and warned her of possible side effects. She is reluctant due to previous problems with low blood pressure. She is even more concerned about taking true antiarrhythmics due to there are multiple side effects.  If she should develop side effects with the verapamil, it is not unreasonable to withhold therapy since the symptoms are infrequent and not particularly severe.  2.  Paroxysmal atrial fibrillation detected by loop recorder in the past without recent recurrence. CHADSVasc 4. On appropriate anticoagulation.  3.  Normally functioning dual-chamber permanent pacemaker.  CareLink download in 3 months, office visit in 12 months.    Current medicines are reviewed at length with the patient today.  The patient has concerns regarding medicines.   Labs/ tests ordered today include:  No orders of the defined types were placed in this encounter.    Patient Instructions  Your physician wants you to follow-up in: 1 Year. You will receive a reminder letter in the mail two months in advance. If you don't receive a letter, please call our office to schedule the follow-up appointment.  Remote monitoring is used to monitor your Pacemaker of ICD from home. This monitoring reduces the number of office visits required to check your device to one time per year. It allows [place] to keep an eye on the functioning of your device to ensure it is working properly. You are scheduled for a device check from home on 10/03/2015. You may send your transmission at any time that day. If you have a wireless device, the  transmission will be sent automatically. After your physician reviews your transmission, you will receive a postcard with your next transmission date.  Start verapamil SR 120 mg daily.  Possible side effects include ankle swelling, constipation or dizziness weakness due to low blood pressure. Please call us if he  develop any of these complaints.       Mikael Spray, MD  07/14/2015 5:53 PM    Sanda Klein, MD, [facility] [phone] office [phone] pager

## 2015-07-21 LAB — CUP PACEART INCLINIC DEVICE CHECK
Brady Statistic AP VP Percent: 0.04 %
Brady Statistic AP VS Percent: 56.19 %
Brady Statistic AS VP Percent: 0.02 %
Date Time Interrogation Session: 20160812195231
Lead Channel Impedance Value: 399 Ohm
Lead Channel Impedance Value: 437 Ohm
Lead Channel Impedance Value: 551 Ohm
Lead Channel Pacing Threshold Amplitude: 0.5 V
Lead Channel Pacing Threshold Pulse Width: 0.4 ms
Lead Channel Sensing Intrinsic Amplitude: 6.625 mV
Lead Channel Sensing Intrinsic Amplitude: 6.625 mV
Lead Channel Setting Pacing Amplitude: 2 V
Lead Channel Setting Sensing Sensitivity: 2.8 mV
MDC IDC MSMT BATTERY REMAINING LONGEVITY: 125 mo
MDC IDC MSMT BATTERY VOLTAGE: 3.03 V
MDC IDC MSMT LEADCHNL RA PACING THRESHOLD AMPLITUDE: 0.75 V
MDC IDC MSMT LEADCHNL RA PACING THRESHOLD PULSEWIDTH: 0.4 ms
MDC IDC MSMT LEADCHNL RA SENSING INTR AMPL: 4.75 mV
MDC IDC MSMT LEADCHNL RA SENSING INTR AMPL: 4.75 mV
MDC IDC MSMT LEADCHNL RV IMPEDANCE VALUE: 475 Ohm
MDC IDC SET LEADCHNL RA PACING AMPLITUDE: 1.5 V
MDC IDC SET LEADCHNL RV PACING PULSEWIDTH: 0.4 ms
MDC IDC SET ZONE DETECTION INTERVAL: 400 ms
MDC IDC STAT BRADY AS VS PERCENT: 43.75 %
MDC IDC STAT BRADY RA PERCENT PACED: 56.23 %
MDC IDC STAT BRADY RV PERCENT PACED: 0.06 %
Zone Setting Detection Interval: 400 ms

## 2015-07-27 DIAGNOSIS — L821 Other seborrheic keratosis: Secondary | ICD-10-CM | POA: Diagnosis not present

## 2015-07-31 ENCOUNTER — Telehealth: Payer: Self-pay | Admitting: Cardiovascular Disease

## 2015-07-31 DIAGNOSIS — I480 Paroxysmal atrial fibrillation: Secondary | ICD-10-CM

## 2015-07-31 NOTE — Telephone Encounter (Signed)
[name]. She states when she established care w/ Dr. Sallyanne Kuster she was given option to f/u w an EP for issues she's been having w/ her heart - wanted to know if this was OK.  Advised should be fine to do EP referral, will sent to Dr. Loletha Grayer for specific recommendations - patient voiced understanding.

## 2015-07-31 NOTE — Telephone Encounter (Signed)
Pt called in stating that when she was last in to see Dr. Loletha Grayer, they talked about referring her to a electrophysiologist and she would like to look into setting up an appt with one. Please call  Thanks

## 2015-08-01 NOTE — Telephone Encounter (Signed)
Please make referral, no particular provider, first available.

## 2015-08-02 NOTE — Telephone Encounter (Signed)
Referral order submitted. Called patient to notify.

## 2015-08-14 ENCOUNTER — Encounter: Payer: Self-pay | Admitting: Cardiovascular Disease

## 2015-08-21 ENCOUNTER — Ambulatory Visit (INDEPENDENT_AMBULATORY_CARE_PROVIDER_SITE_OTHER): Payer: Medicare Other | Admitting: Neurology

## 2015-08-21 ENCOUNTER — Encounter: Payer: Self-pay | Admitting: Neurology

## 2015-08-21 VITALS — BP 128/80 | HR 65 | Temp 98.1°F | Resp 16 | Ht 67.0 in | Wt 227.6 lb

## 2015-08-21 DIAGNOSIS — I480 Paroxysmal atrial fibrillation: Secondary | ICD-10-CM | POA: Diagnosis not present

## 2015-08-21 DIAGNOSIS — I495 Sick sinus syndrome: Secondary | ICD-10-CM

## 2015-08-21 DIAGNOSIS — Z8673 Personal history of transient ischemic attack (TIA), and cerebral infarction without residual deficits: Secondary | ICD-10-CM

## 2015-08-21 DIAGNOSIS — R531 Weakness: Secondary | ICD-10-CM | POA: Diagnosis not present

## 2015-08-21 DIAGNOSIS — F411 Generalized anxiety disorder: Secondary | ICD-10-CM

## 2015-08-21 NOTE — Progress Notes (Signed)
NEUROLOGY CONSULTATION NOTE  [name] [name] MRN: [id number] DOB: 11/27/1941  Referring provider: Dr. Sallyanne Kuster (cardiologist) Primary care provider: Dr. Tamala Julian  Reason for consult:  TIAs  HISTORY OF PRESENT ILLNESS: [name] [name] is a 74 year old right-handed female with paroxysmal atrial fibrillation, tachy-brady syndrome, cardiac pacemaker, hypertension, GERD and history of stroke who presents for TIA.  History obtained from patient, cardiology note and prior hospital notes.  Images of brain MRI and MRA and CT from April 2015 reviewed.  Labs reviewed.  On 03/08/14, she was admitted to [facility] for TIA.  She presented with left sided weakness and numbness with difficulty walking.  Symptoms resolved when she arrived at hospital.  CT of head was unremarkable.  MRI of brain was negative for acute stroke or bleed.  MRA of head showed mild stenosis of both PCAs.  Carotid doppler showed no hemodynamically significant ICA stenosis.  2D echo showed  LVEF 50-55%.  She was initially placed on Plavix.  Hgb A1c was 5.9.  LDL was 77.  After discharge, she followed up with cardiology for palpitations.  A loop recorder revealed recurrent episodes of paroxysmal atrial tachycardia and at least one episode of atrial fibrillation.  She was started on anticoagulation therapy and had a dual-chamber permanent pacemaker implanted for tachycardia-bradycardia syndrome.    On 06/25/15, she was in church when she developed vague episode of strange head sensation and lightheadedness.  It lasted only a few seconds.  However, she has been experiencing these spells off an on ever since.  She said she had a similar sensation after her TIA for quite a while and then it spontaneously resolved.  There was no associated facial droop, focal numbness or weakness, headache, or vertigo.  Interrogation of her pacemaker showed brief episode of nonsustained atrial tachycardia during the time of the spell, but no sustained atrial tachycardia or  atrial fibrillation.  She does report anxiety, some of it related to the health of her daughter who has alpha-1-antitrypsin deficiency.  She is on sertraline and thinks it may need to be increased.  PAST MEDICAL HISTORY: Past Medical History  Diagnosis Date  . Hypertension   . MVP (mitral valve prolapse)   . PVC's (premature ventricular contractions)   . Obesity   . Presence of permanent cardiac pacemaker   . Complication of anesthesia     "I woke up during 2 different procedures" (10/04/2014)  . Atrial fibrillation   . Stroke 01/2014    denies deficits on 10/04/2014  . GERD (gastroesophageal reflux disease)   . Arthritis     "all over"  . Frequent UTI   . 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 Sinus arrest 10/2014    s/p Medtronic Advisa model [id number] serial number [id number]    PAST SURGICAL HISTORY: Past Surgical History  Procedure Laterality Date  . Excision vaginal cyst      benign nodules  . Tubal ligation    . US echocardiography  05/15/2010    LA mildly dilated,mild mitral annular ca+, AOV mildly sclerotic, mild asymmetric LVH  . Nm myocar perf wall motion  12/18/2007    normal  . Insert / replace / remove pacemaker  10/04/2014    Medtronic Advisa model [id number] serial number [id number]  . Loop recorder implant N/A 04/19/2014    Procedure: LOOP RECORDER IMPLANT;  Surgeon: Sanda Klein, MD;  Location: [place] CATH LAB;  Service: Cardiovascular;  Laterality: N/A;  . Loop recorder explant N/A 10/04/2014    Procedure: LOOP RECORDER  EXPLANT;  Surgeon: Sanda Klein, MD;  Location: [facility] CATH LAB;  Service: Cardiovascular;  Laterality: N/A;  . Permanent pacemaker insertion N/A 10/04/2014    Procedure: PERMANENT PACEMAKER INSERTION;  Surgeon: Sanda Klein, MD;  Location: [place] CATH LAB;  Service: Cardiovascular;  Laterality: N/A;    MEDICATIONS: Current Outpatient Prescriptions on File Prior to Visit  Medication Sig Dispense Refill  . acetaminophen (TYLENOL) 325 MG tablet Take 325 mg by mouth  daily as needed (pain).    Marland Kitchen apixaban (ELIQUIS) 5 MG TABS tablet Take 1 tablet (5 mg total) by mouth 2 (two) times daily. 180 tablet 3  . Biotin 5000 MCG CAPS Take 5,000 mcg by mouth daily.     . Calcium Carbonate-Vit D-Min (CALCIUM 1200 PO) Take 2,400 mg by mouth daily.     . cholecalciferol (VITAMIN D) 1000 UNITS tablet Take 1,000 Units by mouth daily.     . ciprofloxacin (CIPRO) 250 MG tablet Take 250 mg by mouth See admin instructions. Take 1 tablet (250 mg) twice daily for 3 days as need for bladder infections    . Co-Enzyme Q10 100 MG CAPS Take 100 mg by mouth daily.     . diazepam (VALIUM) 5 MG tablet Take 5 mg by mouth as needed for anxiety.     . diphenhydrAMINE (BENADRYL) 50 MG tablet Take 0.5-1 tablets (25-50 mg total) by mouth every 6 (six) hours as needed for itching. 30 tablet 0  . Glucosamine-Chondroit-Vit C-Mn (GLUCOSAMINE CHONDR 1500 COMPLX PO) Take 1,500 mg by mouth daily.     . hydrocortisone cream 1 % Apply 1 application topically as needed for itching.    . loratadine (KLS ALLERCLEAR) 10 MG tablet Take 10 mg by mouth daily.    Marland Kitchen LORazepam (ATIVAN) 0.5 MG tablet Take 0.5 mg by mouth as needed for anxiety.    Marland Kitchen MAGNESIUM CITRATE PO Take 250 mg by mouth daily. 1 tab daily    . Menthol, Topical Analgesic, (BIOFREEZE EX) Apply 1 application topically as needed (hip pain).     . Resveratrol 250 MG CAPS Take 250 mg by mouth daily.    . sertraline (ZOLOFT) 50 MG tablet Take 50 mg by mouth daily.     . Simethicone (GAS-X PO) Take 2 tablets by mouth daily as needed (bloating).    . vitamin E (VITAMIN E) 400 UNIT capsule Take 400 Units by mouth daily.    . folic acid (FOLVITE) 945 MCG tablet Take 400 mcg by mouth daily.    . meclizine (ANTIVERT) 25 MG tablet Take 25 mg by mouth as needed for dizziness.     . verapamil (CALAN) 120 MG tablet Take 1 tablet (120 mg total) by mouth 3 (three) times daily. (Patient not taking: Reported on 08/21/2015) 30 tablet 11   No current  facility-administered medications on file prior to visit.    ALLERGIES: Allergies  Allergen Reactions  . Ancef [Cefazolin] Hives and Itching  . Caffeine Palpitations    FAMILY HISTORY: Family History  Problem Relation Age of Onset  . Heart failure Mother   . Stroke Father   . Heart failure Brother     SOCIAL HISTORY: Social History   Social History  . Marital Status: Married    Spouse Name: N/A  . Number of Children: 3  . Years of Education: college   Occupational History  . Not on file.   Social History Main Topics  . Smoking status: Former Smoker -- 2.00 packs/day for 18 years  Types: Cigarettes  . Smokeless tobacco: Never Used     Comment: "quit smoking in 1971"  . Alcohol Use: 0.0 oz/week    0 Standard drinks or equivalent per week     Comment: 10/04/2014 "might have a drink a couple times/yr"  . Drug Use: No  . Sexual Activity: No   Other Topics Concern  . Not on file   Social History Narrative   Patient lives alone in one story house.   Patient is right handed.   Patient has a college degree.   Patient is retired.    REVIEW OF SYSTEMS: Constitutional: No fevers, chills, or sweats, no generalized fatigue, change in appetite Eyes: No visual changes, double vision, eye pain Ear, nose and throat: No hearing loss, ear pain, nasal congestion, sore throat Cardiovascular: No chest pain, palpitations Respiratory:  No shortness of breath at rest or with exertion, wheezes GastrointestinaI: No nausea, vomiting, diarrhea, abdominal pain, fecal incontinence Genitourinary:  No dysuria, urinary retention or frequency Musculoskeletal:  No neck pain, back pain Integumentary: No rash, pruritus, skin lesions Neurological: as above Psychiatric: No depression, insomnia, anxiety Endocrine: No palpitations, fatigue, diaphoresis, mood swings, change in appetite, change in weight, increased thirst Hematologic/Lymphatic:  No anemia, purpura,  petechiae. Allergic/Immunologic: no itchy/runny eyes, nasal congestion, recent allergic reactions, rashes  PHYSICAL EXAM: Filed Vitals:   08/21/15 1109  BP: 128/80  Pulse: 65  Temp: 98.1 F (36.7 C)  Resp: 16   General: No acute distress.  Patient appears well-groomed.  A little anxious. Head:  Normocephalic/atraumatic Eyes:  fundi unremarkable, without vessel changes, exudates, hemorrhages or papilledema. Neck: supple, no paraspinal tenderness, full range of motion Back: No paraspinal tenderness Heart: regular rate and rhythm Lungs: Clear to auscultation bilaterally. Vascular: No carotid bruits. Neurological Exam: Mental status: alert and oriented to person, place, and time, recent and remote memory intact, fund of knowledge intact, attention and concentration intact, speech fluent and not dysarthric, language intact. Cranial nerves: CN I: not tested CN II: pupils equal, round and reactive to light, visual fields intact, fundi unremarkable, without vessel changes, exudates, hemorrhages or papilledema. CN III, IV, VI:  full range of motion, no nystagmus, no ptosis CN V: facial sensation intact CN VII: upper and lower face symmetric CN VIII: hearing intact CN IX, X: gag intact, uvula midline CN XI: sternocleidomastoid and trapezius muscles intact CN XII: tongue midline Bulk & Tone: normal, no fasciculations. Motor:  5/5 throughout Sensation:   temperature intact. Decreased vibration sensation in toe of right foot. Deep Tendon Reflexes:  2+ throughout, toes downgoing.  Finger to nose testing:  Without dysmetria.  Heel to shin:  Without dysmetria.  Gait:  Antalgic gait (sprained ankle and needs hip replacement).  Able to tandem walk.  Romberg with mild sway.  IMPRESSION: Episodes of fatigue.  I don't suspect recurrent TIAs. Symptoms are vague and non-focal.  They are likely related to her arrhythmia (as it seems to correlate with spells).  They may also be enhanced by her  anxiety. Tachy-brady syndrome History of TIA Hypertension anxiety  PLAN: 1.  Continue Eliquis 2.  Continue care via cardiologist 3.  Repeat carotid doppler in April 2017 with follow up soon afterwards.  Thank you for allowing me to take part in the care of this patient.  Metta Clines, DO  CC:  Carol Ada, MD  Sanda Klein, MD

## 2015-08-21 NOTE — Patient Instructions (Signed)
I think the spells are related to the arrhythmia and maybe made worse by anxiety.  I don't suspect new TIAs. I think we should repeat Carotid doppler in April 2017 and follow up soon afterwards.

## 2015-08-23 ENCOUNTER — Ambulatory Visit (INDEPENDENT_AMBULATORY_CARE_PROVIDER_SITE_OTHER): Payer: Medicare Other | Admitting: Cardiology

## 2015-08-23 ENCOUNTER — Encounter: Payer: Self-pay | Admitting: Cardiovascular Disease

## 2015-08-23 ENCOUNTER — Encounter: Payer: Self-pay | Admitting: Cardiology

## 2015-08-23 VITALS — BP 118/70 | HR 75 | Ht 67.0 in | Wt 227.8 lb

## 2015-08-23 DIAGNOSIS — I471 Supraventricular tachycardia: Secondary | ICD-10-CM | POA: Diagnosis not present

## 2015-08-23 DIAGNOSIS — I480 Paroxysmal atrial fibrillation: Secondary | ICD-10-CM

## 2015-08-23 MED ORDER — METOPROLOL TARTRATE 25 MG PO TABS: 25.0000 mg | ORAL_TABLET | Freq: Two times a day (BID) | ORAL | Status: DC

## 2015-08-23 NOTE — Addendum Note (Signed)
Addended by: Stanton Kidney on: 08/23/2015 02:39 PM   Modules accepted: Orders

## 2015-08-23 NOTE — Patient Instructions (Addendum)
Medication Instructions:  Your physician has recommended you make the following change in your medication:  1) START Metoprolol 25 mg twice a day  Labwork: None ordered  Testing/Procedures: Your physician has recommended that you have a sleep study. This test records several body functions during sleep, including: brain activity, eye movement, oxygen and carbon dioxide blood levels, heart rate and rhythm, breathing rate and rhythm, the flow of air through your mouth and nose, snoring, body muscle movements, and chest and belly movement.  Follow-Up: No follow up is needed at this time with Dr. Curt Bears.  He will see you on an as needed basis.  Any Other Special Instructions Will Be Listed Below (If Applicable). Thank you for choosing [facility]!!   Trinidad Curet, RN [phone]   Metoprolol tablets What is this medicine? METOPROLOL (me TOE proe lole) is a beta-blocker. Beta-blockers reduce the workload on the heart and help it to beat more regularly. This medicine is used to treat high blood pressure and to prevent chest pain. It is also used to after a heart attack and to prevent an additional heart attack from occurring. This medicine may be used for other purposes; ask your health care provider or pharmacist if you have questions. COMMON BRAND NAME(S): Lopressor What should I tell my health care provider before I take this medicine? They need to know if you have any of these conditions: -diabetes -heart or vessel disease like slow heart rate, worsening heart failure, heart block, sick sinus syndrome or Raynaud's disease -kidney disease -liver disease -lung or breathing disease, like asthma or emphysema -pheochromocytoma -thyroid disease -an unusual or allergic reaction to metoprolol, other beta-blockers, medicines, foods, dyes, or preservatives -pregnant or trying to get pregnant -breast-feeding How should I use this medicine? Take this medicine by mouth with a drink of  water. Follow the directions on the prescription label. Take this medicine immediately after meals. Take your doses at regular intervals. Do not take more medicine than directed. Do not stop taking this medicine suddenly. This could lead to serious heart-related effects. Talk to your pediatrician regarding the use of this medicine in children. Special care may be needed. Overdosage: If you think you have taken too much of this medicine contact a poison control center or emergency room at once. NOTE: This medicine is only for you. Do not share this medicine with others. What if I miss a dose? If you miss a dose, take it as soon as you can. If it is almost time for your next dose, take only that dose. Do not take double or extra doses. What may interact with this medicine? This medicine may interact with the following medications: -certain medicines for blood pressure, heart disease, irregular heart beat -certain medicines for depression like monoamine oxidase (MAO) inhibitors, fluoxetine, or paroxetine -clonidine -dobutamine -epinephrine -isoproterenol -reserpine This list may not describe all possible interactions. Give your health care provider a list of all the medicines, herbs, non-prescription drugs, or dietary supplements you use. Also tell them if you smoke, drink alcohol, or use illegal drugs. Some items may interact with your medicine. What should I watch for while using this medicine? Visit your doctor or health care professional for regular check ups. Contact your doctor right away if your symptoms worsen. Check your blood pressure and pulse rate regularly. Ask your health care professional what your blood pressure and pulse rate should be, and when you should contact them. You may get drowsy or dizzy. Do not drive, use machinery,  or do anything that needs mental alertness until you know how this medicine affects you. Do not sit or stand up quickly, especially if you are an older patient.  This reduces the risk of dizzy or fainting spells. Contact your doctor if these symptoms continue. Alcohol may interfere with the effect of this medicine. Avoid alcoholic drinks. What side effects may I notice from receiving this medicine? Side effects that you should report to your doctor or health care professional as soon as possible: -allergic reactions like skin rash, itching or hives -cold or numb hands or feet -depression -difficulty breathing -faint -fever with sore throat -irregular heartbeat, chest pain -rapid weight gain -swollen legs or ankles Side effects that usually do not require medical attention (report to your doctor or health care professional if they continue or are bothersome): -anxiety or nervousness -change in sex drive or performance -dry skin -headache -nightmares or trouble sleeping -short term memory loss -stomach upset or diarrhea -unusually tired This list may not describe all possible side effects. Call your doctor for medical advice about side effects. You may report side effects to FDA at 1-800-FDA-1088. Where should I keep my medicine? Keep out of the reach of children. Store at room temperature between 15 and 30 degrees C (59 and 86 degrees F). Throw away any unused medicine after the expiration date. NOTE: This sheet is a summary. It may not cover all possible information. If you have questions about this medicine, talk to your doctor, pharmacist, or health care provider.  2015, [employer]. (2013-07-23 14:40:36)

## 2015-08-23 NOTE — Progress Notes (Signed)
Electrophysiology Office Note   Date:  08/23/2015   ID:  RIKA DAUGHDRILL, DOB Feb 05, 1941, MRN [id number]  PCP:  [name] Naas, MD  Cardiologist:  [name] Klein, MD Primary Electrophysiologist:  [name] Haw, MD    Chief Complaint  Patient presents with  . PAF     History of Present Illness: [name] [name] is a 74 y.o. female who presents today for electrophysiology evaluation.   She has a history of PVCs, pacemaker for tachy-brady syndrome, AF, stroke in 2015.  She had "symptoms of stroke". This happened on July 24, around 10:00 in the morning in church. It only lasted for several seconds. She has had a few other milder episodes before and sense. The symptoms are a poorly described uneasy sensation in her head, reminiscent of her hospitalization for transient ischemic attack last year. That previous event to implantation of a loop recorder that identified recurrent episodes of paroxysmal atrial tachycardia, but also at least one episode of atrial fibrillation. 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   Interrogation of her pacemaker shows that she had a brief episode of nonsustained atrial tachycardia with a 1-1 atrioventricular conduction at about 175 bpm around the time of her symptoms. She has not had any episodes of sustained atrial tachycardia or atrial fibrillation since her last pacemaker check.   Today, she denies symptoms of palpitations, chest pain, shortness of breath, orthopnea, PND, lower extremity edema, claudication, dizziness, presyncope, syncope, bleeding, or neurologic sequela. The patient is tolerating medications without difficulties and is otherwise without complaint today.  She does have fatigue at times.  This is unlikely related to tachycardia based off of her device interrogation.  She feels that it could be due to depression, and her daughter feels that she  may have OSA which could contribute.     Past Medical History  Diagnosis Date  . Hypertension   . MVP (mitral valve prolapse)   . PVC's (premature ventricular contractions)   . Obesity   . Presence of permanent cardiac pacemaker   . Complication of anesthesia     "I woke up during 2 different procedures" (10/04/2014)  . Atrial fibrillation   . Stroke 01/2014    denies deficits on 10/04/2014  . GERD (gastroesophageal reflux disease)   . Arthritis     "all over"  . Frequent UTI   . 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 Sinus arrest 10/2014    s/p Medtronic Advisa model [id number] serial number [id number]   Past Surgical History  Procedure Laterality Date  . Excision vaginal cyst      benign nodules  . Tubal ligation    . US echocardiography  05/15/2010    LA mildly dilated,mild mitral annular ca+, AOV mildly sclerotic, mild asymmetric LVH  . Nm myocar perf wall motion  12/18/2007    normal  . Insert / replace / remove pacemaker  10/04/2014    Medtronic Advisa model [id number] serial number [id number]  . Loop recorder implant N/A 04/19/2014    Procedure: LOOP RECORDER IMPLANT;  Surgeon: [name] Klein, MD;  Location: [place] [facility];  Service: Cardiovascular;  Laterality: N/A;  . Loop recorder explant N/A 10/04/2014    Procedure: LOOP RECORDER EXPLANT;  Surgeon: [name] Klein, MD;  Location: [place] [facility];  Service: Cardiovascular;  Laterality: N/A;  . Permanent pacemaker insertion N/A 10/04/2014    Procedure: PERMANENT PACEMAKER INSERTION;  Surgeon: [name] Klein, MD;  Location: [place] [facility];  Service: Cardiovascular;  Laterality: N/A;     Current Outpatient Prescriptions  Medication Sig Dispense Refill  . acetaminophen (TYLENOL) 325 MG tablet Take 325 mg by mouth daily as needed (pain).    Marland Kitchen apixaban (ELIQUIS) 5 MG TABS tablet Take 1 tablet (5 mg total) by mouth 2 (two) times daily. 180 tablet 3  . Biotin 5000 MCG CAPS Take 5,000 mcg by mouth daily.     . Calcium Carbonate-Vit D-Min (CALCIUM 1200  PO) Take 2,400 mg by mouth daily.     . cholecalciferol (VITAMIN D) 1000 UNITS tablet Take 1,000 Units by mouth daily.     . ciprofloxacin (CIPRO) 250 MG tablet Take 250 mg by mouth See admin instructions. Take 1 tablet (250 mg) twice daily for 3 days as need for bladder infections    . Co-Enzyme Q10 100 MG CAPS Take 100 mg by mouth daily.     . diazepam (VALIUM) 5 MG tablet Take 5 mg by mouth as needed for anxiety.     . diphenhydrAMINE (BENADRYL) 50 MG tablet Take 0.5-1 tablets (25-50 mg total) by mouth every 6 (six) hours as needed for itching. 30 tablet 0  . folic acid (FOLVITE) 604 MCG tablet Take 400 mcg by mouth daily.    . Glucosamine-Chondroit-Vit C-Mn (GLUCOSAMINE CHONDR 1500 COMPLX PO) Take 1,500 mg by mouth daily.     . hydrocortisone cream 1 % Apply 1 application topically as needed for itching.    . loratadine (KLS ALLERCLEAR) 10 MG tablet Take 10 mg by mouth daily.    Marland Kitchen LORazepam (ATIVAN) 0.5 MG tablet Take 0.5 mg by mouth as needed for anxiety.    Marland Kitchen MAGNESIUM CITRATE PO Take 250 mg by mouth daily. 1 tab daily    . meclizine (ANTIVERT) 25 MG tablet Take 25 mg by mouth as needed for dizziness.     . Menthol, Topical Analgesic, (BIOFREEZE EX) Apply 1 application topically as needed (hip pain).     . Resveratrol 250 MG CAPS Take 250 mg by mouth daily.    . sertraline (ZOLOFT) 50 MG tablet Take 50 mg by mouth daily.     . Simethicone (GAS-X PO) Take 2 tablets by mouth daily as needed (bloating).    . vitamin E (VITAMIN E) 400 UNIT capsule Take 400 Units by mouth daily.     No current facility-administered medications for this visit.    Allergies:   Ancef and Caffeine   Social History:  The patient  reports that she has quit smoking. Her smoking use included Cigarettes. She has a 36 pack-year smoking history. She has never used smokeless tobacco. She reports that she drinks alcohol. She reports that she does not use illicit drugs.   Family History:  The patient's family history  includes Heart failure in her brother and mother; Stroke in her father.    ROS:  Please see the history of present illness.   Otherwise, review of systems is positive for shortness of breath with activity, back pain, dizziness/lightheadedness, fatigue, skipped heart beats, irregular heart beats.   All other systems are reviewed and negative.    PHYSICAL EXAM: VS:  BP 118/70 mmHg  Pulse 75  Ht _0  (1.702 m)  Wt 227 lb 12.8 oz (103.329 kg)  BMI 35.67 kg/m2 , BMI Body mass index is 35.67 kg/(m^2). GEN: Well nourished, well developed, in no acute distress HEENT: normal Neck: no JVD, carotid bruits, or masses Cardiac: RRR; no murmurs, rubs, or gallops,no edema  Respiratory:  clear to  auscultation bilaterally, normal work of breathing GI: soft, nontender, nondistended, + BS MS: no deformity or atrophy Skin: warm and dry Neuro:  Strength and sensation are intact Psych: euthymic mood, full affect  EKG:  EKG is ordered today. The ekg ordered today shows rate 75, sinus rhythm  Device interrogation is reviewed today in detail.  See PaceArt for details.   Recent Labs: 09/28/2014: ALT 15; BUN 18; Creat 0.86; Hemoglobin 14.7; Platelets 252; Potassium 4.6; Sodium 139    Lipid Panel     Component Value Date/Time   CHOL 152 03/09/2014 0440   TRIG 87 03/09/2014 0440   HDL 58 03/09/2014 0440   CHOLHDL 2.6 03/09/2014 0440   VLDL 17 03/09/2014 0440   LDLCALC 77 03/09/2014 0440     Wt Readings from Last 3 Encounters:  08/23/15 227 lb 12.8 oz (103.329 kg)  08/21/15 227 lb 9.6 oz (103.239 kg)  07/14/15 228 lb (103.42 kg)      Other studies Reviewed: Additional studies/ records that were reviewed today include: TTE 03/09/14 Review of the above records today demonstrates:  Left ventricle: The cavity size was normal. Systolic function was normal. The estimated ejection fraction was in the range of 50% to 55%. Wall motion was normal; there were no regional wall motion abnormalities.  There was an increased relative contribution of atrial contraction to ventricular filling. Doppler parameters are consistent with abnormal left ventricular relaxation (grade 1 diastolic dysfunction).    ASSESSMENT AND PLAN:  1.  Atrial tachycardia: Seen on device interrogation with symptoms during church.  Has tried verapaiml in the past with increased fatigue.  Has also been on metoprolol in the past which was well tolerated.  She says that she her BP was low on metoprolol but she did not have any symptoms of weakness or tiredness.  Due to that, we will order metoprolol 15 mg BID.  This will hopefully decrease any further arrhythmia.  Did discuss ablation with her but she is not keen on that yet and will think about it in the future.  2. Paroxysmal atrial fibrillation: on Eliquis for anticoagulation and tolerating well.  She is not on any rate controlling medications.  Has CHADS2VASc of 5.  This patients CHA2DS2-VASc Score and unadjusted Ischemic Stroke Rate (% per year) is equal to 7.2 % stroke rate/year from a score of 5  Above score calculated as 1 point each if present [CHF, HTN, DM, Vascular=MI/PAD/Aortic Plaque, Age if 65-74, or Female] Above score calculated as 2 points each if present [Age > 75, or Stroke/TIA/TE]  3. Tachy-Brady syndrome: Pacemaker working appropriately, no changes necessary.  4. Fatigue: likely multifactorial.  Sleep study ordered.   Current medicines are reviewed at length with the patient today.   The patient does not have concerns regarding her medicines.  The following changes were made today:  Metoprolol 25 mg BID.  Labs/ tests ordered today include: sleep study  No orders of the defined types were placed in this encounter.     Disposition:   FU with Primary cardiologist to call if issue continues or worsens.  Signed, [name], MD  08/23/2015 1:54 PM     [address] [place]  [phone] (office) [phone] (fax)

## 2015-08-28 ENCOUNTER — Ambulatory Visit: Payer: Medicare Other | Admitting: Neurology

## 2015-09-15 LAB — CUP PACEART INCLINIC DEVICE CHECK
Battery Remaining Longevity: 125 mo
Battery Voltage: 3.03 V
Brady Statistic AS VP Percent: 0.1 %
Brady Statistic RA Percent Paced: 59.01 %
Implantable Lead Implant Date: 20151103
Implantable Lead Model: 5076
Implantable Lead Model: 5076
Lead Channel Impedance Value: 380 Ohm
Lead Channel Pacing Threshold Amplitude: 0.875 V
Lead Channel Pacing Threshold Pulse Width: 0.4 ms
Lead Channel Pacing Threshold Pulse Width: 0.4 ms
Lead Channel Sensing Intrinsic Amplitude: 5.125 mV
Lead Channel Sensing Intrinsic Amplitude: 6.25 mV
Lead Channel Sensing Intrinsic Amplitude: 8 mV
Lead Channel Setting Pacing Amplitude: 2 V
Lead Channel Setting Sensing Sensitivity: 2.8 mV
MDC IDC LEAD IMPLANT DT: 20151103
MDC IDC LEAD LOCATION: 753859
MDC IDC LEAD LOCATION: 753860
MDC IDC MSMT LEADCHNL RA IMPEDANCE VALUE: 532 Ohm
MDC IDC MSMT LEADCHNL RV IMPEDANCE VALUE: 456 Ohm
MDC IDC MSMT LEADCHNL RV IMPEDANCE VALUE: 475 Ohm
MDC IDC MSMT LEADCHNL RV PACING THRESHOLD AMPLITUDE: 0.5 V
MDC IDC MSMT LEADCHNL RV SENSING INTR AMPL: 6 mV
MDC IDC SESS DTM: 20160921174523
MDC IDC SET LEADCHNL RA PACING AMPLITUDE: 1.75 V
MDC IDC SET LEADCHNL RV PACING PULSEWIDTH: 0.4 ms
MDC IDC SET ZONE DETECTION INTERVAL: 400 ms
MDC IDC SET ZONE DETECTION INTERVAL: 400 ms
MDC IDC STAT BRADY AP VP PERCENT: 0.04 %
MDC IDC STAT BRADY AP VS PERCENT: 58.97 %
MDC IDC STAT BRADY AS VS PERCENT: 40.89 %
MDC IDC STAT BRADY RV PERCENT PACED: 0.14 %

## 2015-10-07 DIAGNOSIS — Z23 Encounter for immunization: Secondary | ICD-10-CM | POA: Diagnosis not present

## 2015-10-11 DIAGNOSIS — F419 Anxiety disorder, unspecified: Secondary | ICD-10-CM | POA: Diagnosis not present

## 2015-10-16 ENCOUNTER — Telehealth: Payer: Self-pay | Admitting: Cardiology

## 2015-10-16 ENCOUNTER — Ambulatory Visit (INDEPENDENT_AMBULATORY_CARE_PROVIDER_SITE_OTHER): Payer: Medicare Other | Admitting: *Deleted

## 2015-10-16 DIAGNOSIS — I480 Paroxysmal atrial fibrillation: Secondary | ICD-10-CM

## 2015-10-16 NOTE — Telephone Encounter (Signed)
Spoke with pt and reminded pt of remote transmission that is due today. Pt verbalized understanding.   

## 2015-10-17 NOTE — Progress Notes (Signed)
Loop recorder

## 2015-10-25 LAB — CUP PACEART REMOTE DEVICE CHECK
Battery Voltage: 3.03 V
Brady Statistic AS VP Percent: 0.01 %
Brady Statistic AS VS Percent: 11.79 %
Brady Statistic RA Percent Paced: 88.21 %
Implantable Lead Implant Date: 20151103
Implantable Lead Location: 753860
Implantable Lead Model: 5076
Lead Channel Impedance Value: 399 Ohm
Lead Channel Impedance Value: 532 Ohm
Lead Channel Pacing Threshold Amplitude: 0.5 V
Lead Channel Pacing Threshold Amplitude: 0.75 V
Lead Channel Pacing Threshold Pulse Width: 0.4 ms
Lead Channel Pacing Threshold Pulse Width: 0.4 ms
Lead Channel Sensing Intrinsic Amplitude: 4.375 mV
Lead Channel Sensing Intrinsic Amplitude: 4.375 mV
Lead Channel Sensing Intrinsic Amplitude: 5.75 mV
Lead Channel Setting Pacing Amplitude: 1.5 V
Lead Channel Setting Sensing Sensitivity: 2.8 mV
MDC IDC LEAD IMPLANT DT: 20151103
MDC IDC LEAD LOCATION: 753859
MDC IDC MSMT BATTERY REMAINING LONGEVITY: 118 mo
MDC IDC MSMT LEADCHNL RV IMPEDANCE VALUE: 456 Ohm
MDC IDC MSMT LEADCHNL RV IMPEDANCE VALUE: 494 Ohm
MDC IDC MSMT LEADCHNL RV SENSING INTR AMPL: 5.75 mV
MDC IDC SESS DTM: 20161114233024
MDC IDC SET LEADCHNL RV PACING AMPLITUDE: 2 V
MDC IDC SET LEADCHNL RV PACING PULSEWIDTH: 0.4 ms
MDC IDC STAT BRADY AP VP PERCENT: 0.15 %
MDC IDC STAT BRADY AP VS PERCENT: 88.06 %
MDC IDC STAT BRADY RV PERCENT PACED: 0.16 %

## 2015-11-01 ENCOUNTER — Other Ambulatory Visit: Payer: Self-pay | Admitting: Pharmacist Clinician (PhC)/ Clinical Pharmacy Specialist

## 2015-11-01 ENCOUNTER — Encounter: Payer: Self-pay | Admitting: Cardiology

## 2015-11-01 MED ORDER — APIXABAN 5 MG PO TABS: 5.0000 mg | ORAL_TABLET | Freq: Two times a day (BID) | ORAL | Status: DC

## 2015-11-30 ENCOUNTER — Ambulatory Visit (HOSPITAL_BASED_OUTPATIENT_CLINIC_OR_DEPARTMENT_OTHER): Payer: Medicare Other | Attending: Cardiology | Admitting: *Deleted

## 2015-11-30 VITALS — Ht 67.0 in | Wt 237.0 lb

## 2015-11-30 DIAGNOSIS — R5383 Other fatigue: Secondary | ICD-10-CM | POA: Insufficient documentation

## 2015-11-30 DIAGNOSIS — R0683 Snoring: Secondary | ICD-10-CM | POA: Diagnosis not present

## 2015-11-30 DIAGNOSIS — I480 Paroxysmal atrial fibrillation: Secondary | ICD-10-CM | POA: Insufficient documentation

## 2015-11-30 DIAGNOSIS — Z7901 Long term (current) use of anticoagulants: Secondary | ICD-10-CM | POA: Insufficient documentation

## 2015-11-30 DIAGNOSIS — I471 Supraventricular tachycardia: Secondary | ICD-10-CM | POA: Diagnosis not present

## 2015-11-30 DIAGNOSIS — I493 Ventricular premature depolarization: Secondary | ICD-10-CM | POA: Insufficient documentation

## 2015-11-30 DIAGNOSIS — I10 Essential (primary) hypertension: Secondary | ICD-10-CM | POA: Insufficient documentation

## 2015-11-30 DIAGNOSIS — Z79899 Other long term (current) drug therapy: Secondary | ICD-10-CM | POA: Insufficient documentation

## 2015-11-30 DIAGNOSIS — G4733 Obstructive sleep apnea (adult) (pediatric): Secondary | ICD-10-CM | POA: Diagnosis not present

## 2015-12-11 NOTE — Sleep Study (Signed)
NAME: [name] [name]  Patient Name: [name], [name] Date: 11/30/2015 Gender: Female D.O.B: 1941-07-24 Age (years): 66 Referring Provider: Will Camnitz Height (inches): 67 Interpreting Physician: Shelva Majestic MD, ABSM Weight (lbs): 237 RPSGT: Gerhard Perches BMI: 37 MRN: [id number] Neck Size: 15.00  CLINICAL INFORMATION Sleep Study Type: Split Night CPAP Indication for sleep study: Fatigue, Hypertension Epworth Sleepiness Score: 13  SLEEP STUDY TECHNIQUE As per the AASM Manual for the Scoring of Sleep and Associated Events v2.3 (April 2016) with a hypopnea requiring 4% desaturations. The channels recorded and monitored were frontal, central and occipital EEG, electrooculogram (EOG), submentalis EMG (chin), nasal and oral airflow, thoracic and abdominal wall motion, anterior tibialis EMG, snore microphone, electrocardiogram, and pulse oximetry. Continuous positive airway pressure (CPAP) was initiated when the patient met split night criteria and was titrated according to treat sleep-disordered breathing.  MEDICATIONS  acetaminophen (TYLENOL) 325 MG tablet 325 mg, Daily PRN     apixaban (ELIQUIS) 5 MG TABS tablet 5 mg, 2 times daily     Biotin 5000 MCG CAPS 5,000 mcg, Daily     Calcium Carbonate-Vit D-Min (CALCIUM 1200 PO) 2,400 mg, Daily     cholecalciferol (VITAMIN D) 1000 UNITS tablet 1,000 Units, Daily     ciprofloxacin (CIPRO) 250 MG tablet 250 mg, See admin instructions     Co-Enzyme Q10 100 MG CAPS 100 mg, Daily     diazepam (VALIUM) 5 MG tablet 5 mg, As needed     diphenhydrAMINE (BENADRYL) 50 MG tablet 25-50 mg, Every 6 hours PRN     folic acid (FOLVITE) 409 MCG tablet 400 mcg, Daily     Glucosamine-Chondroit-Vit C-Mn (GLUCOSAMINE CHONDR 1500 COMPLX PO) 1,500 mg, Daily     hydrocortisone cream 1 % 1 application, As needed     loratadine (KLS ALLERCLEAR) 10 MG tablet 10 mg, Daily     LORazepam (ATIVAN) 0.5 MG tablet 0.5 mg, As needed     MAGNESIUM CITRATE PO 250  mg, Daily     meclizine (ANTIVERT) 25 MG tablet 25 mg, As needed     Note: Received from: External Pharmacy Received Sig: (Written 04/15/2014 1059)   Menthol, Topical Analgesic, (BIOFREEZE EX) 1 application, As needed     metoprolol tartrate (LOPRESSOR) 25 MG tablet 25 mg, 2 times daily     Resveratrol 250 MG CAPS 250 mg, Daily     sertraline (ZOLOFT) 50 MG tablet 50 mg, Daily     Simethicone (GAS-X PO) 2 tablet, Daily PRN     vitamin E (VITAMIN E) 400 UNIT capsule 400 Units, Daily   Medications administered by patient during sleep study : No sleep medicine administered.  RESPIRATORY PARAMETERS Diagnostic Total AHI (/hr): 18.2 RDI (/hr): 29.2 OA Index (/hr): 1.3 CA Index (/hr): 0.0 REM AHI (/hr): N/A NREM AHI (/hr): 18.2 Supine AHI (/hr): 47.8 Non-supine AHI (/hr): 0.00 Min O2 Sat (%): 85.00 Mean O2 (%): 89.98 Time below 88% (min): 28.1   Titration Optimal Pressure (cm): 11 AHI at Optimal Pressure (/hr): 0.0 Min O2 at Optimal Pressure (%): 89.0 Supine % at Optimal (%): 61 Sleep % at Optimal (%): 86    SLEEP ARCHITECTURE The recording time for the entire night was 363.2 minutes.  During a baseline period of 170.9 minutes, the patient slept for 135.5 minutes in REM and nonREM, yielding a sleep efficiency of 79.3%. Sleep onset after lights out was 22.8 minutes with a REM latency of N/A minutes. The patient spent 16.61% of the night in stage  N1 sleep, 83.39% in stage N2 sleep, 0.00% in stage N3 and 0.00% in REM.   During the titration period of 180.3 minutes, the patient slept for 129.0 minutes in REM and nonREM, yielding a sleep efficiency of 71.6%. Sleep onset after CPAP initiation was 30.3 minutes with a REM latency of 36.5 minutes. The patient spent 17.83% of the night in stage N1 sleep, 49.99% in stage N2 sleep, 0.00% in stage N3 and 32.17% in REM.  CARDIAC DATA The 2 lead EKG demonstrated pacemaker generated. The mean heart rate was 64.49 beats per minute. Other EKG findings include:  PVCs.  LEG MOVEMENT DATA The total Periodic Limb Movements of Sleep (PLMS) were 429. The PLMS index was 97.32 .  IMPRESSIONS   - Moderate obstructive sleep apnea occurred during the diagnostic portion of the study (AHI = 18.2/hour). However, REM sleep was not achieved on the baseline study and the severity may be underestimated . An optimal PAP pressure was selected for this patient ( 11 cm of water) - No significant central sleep apnea occurred during the diagnostic portion of the study (CAI = 0.0/hour). - Mildly reduced sleep efficiency. - Mild oxygen desaturation was noted during the diagnostic portion of the study to a nadir of 85.00%. - The patient snored with Soft snoring volume during the diagnostic portion of the study which resolved with CPAP. - EKG findings include PVCs. - Clinically significant periodic limb movements did not occur during sleep.  DIAGNOSIS - Obstructive Sleep Apnea (327.23 [G47.33 ICD-10])  RECOMMENDATIONS - Recommend an initial trial of CPAP with an EPR of 3 at 11 cm H2O with heated humidification. A  Medium size Resmed Full Face Mask AirFit F10 mask was used during the titration study. - Efforts should be made to optimize nasal and oropharyngeal patency.  - Avoid alcohol, sedatives and other CNS depressants that may worsen sleep apnea and disrupt normal sleep architecture. - Sleep hygiene should be reviewed to assess factors that may improve sleep quality. - Weight management (BMI 37) and regular exercise should be initiated or continued. - Recommend a download be obtained in 30 days and sleep clinic evaluation.  Troy Sine, MD, [place], American Board of Sleep Medicine  ELECTRONICALLY SIGNED ON:  12/11/2015, 7:40 PM [place] PH: [phone]   FX: [phone] [place]

## 2015-12-14 ENCOUNTER — Telehealth: Payer: Self-pay | Admitting: Cardiology

## 2015-12-14 NOTE — Telephone Encounter (Signed)
New message       Calling to get sleep study results

## 2015-12-18 ENCOUNTER — Telehealth: Payer: Self-pay | Admitting: Cardiology

## 2015-12-18 NOTE — Telephone Encounter (Signed)
New message     Calling for sleep study results.  Pt said it has been 3 weeks since she had the study

## 2015-12-19 NOTE — Telephone Encounter (Signed)
Informed patient that I will forward request to Dr. Evette Georges assist, Barry Brunner, to call her and give her detailed findings of sleep study. Patient understands someone will call her from [place] office.

## 2015-12-19 NOTE — Telephone Encounter (Signed)
F/u  Pt returning RN phone call. Please call back and discuss.

## 2015-12-21 NOTE — Telephone Encounter (Signed)
Informed patient of sleep study results and recommendations. Referral made to [facility] home [facility].

## 2015-12-21 NOTE — Progress Notes (Signed)
Patient notified of sleep  Study results and recommendations. Referred to [facility] home [facility].

## 2015-12-25 NOTE — Telephone Encounter (Signed)
ok 

## 2016-01-08 DIAGNOSIS — J019 Acute sinusitis, unspecified: Secondary | ICD-10-CM | POA: Diagnosis not present

## 2016-01-11 ENCOUNTER — Telehealth: Payer: Self-pay | Admitting: Cardiovascular Disease

## 2016-01-11 NOTE — Telephone Encounter (Signed)
Spoke w/ Mariann Laster. This has been brought to her attention recently. There is an epic order which was sent which requires Dr. Berneice Heinrich. Alternatively, original paperwork can be signed (instead of stamp which was done) and resubmitted so medicare will cover equipment.  Pt aware I will defer to physician.

## 2016-01-11 NOTE — Telephone Encounter (Signed)
Pt is calling wanting to speak with [name] [name] about Dr. Claiborne Billings signing a form for her CPAP machine. Please f/u with her  Thanks

## 2016-01-12 DIAGNOSIS — J019 Acute sinusitis, unspecified: Secondary | ICD-10-CM | POA: Diagnosis not present

## 2016-01-15 ENCOUNTER — Ambulatory Visit (INDEPENDENT_AMBULATORY_CARE_PROVIDER_SITE_OTHER): Payer: Medicare Other | Admitting: *Deleted

## 2016-01-15 ENCOUNTER — Telehealth: Payer: Self-pay | Admitting: Cardiology

## 2016-01-15 ENCOUNTER — Encounter: Payer: Self-pay | Admitting: Cardiology

## 2016-01-15 DIAGNOSIS — I495 Sick sinus syndrome: Secondary | ICD-10-CM

## 2016-01-15 NOTE — Telephone Encounter (Signed)
Spoke with pt and reminded pt of remote transmission that is due today. Pt verbalized understanding.   

## 2016-01-16 DIAGNOSIS — H5203 Hypermetropia, bilateral: Secondary | ICD-10-CM | POA: Diagnosis not present

## 2016-01-16 DIAGNOSIS — H2513 Age-related nuclear cataract, bilateral: Secondary | ICD-10-CM | POA: Diagnosis not present

## 2016-01-16 DIAGNOSIS — H524 Presbyopia: Secondary | ICD-10-CM | POA: Diagnosis not present

## 2016-01-16 DIAGNOSIS — H52223 Regular astigmatism, bilateral: Secondary | ICD-10-CM | POA: Diagnosis not present

## 2016-01-16 NOTE — Progress Notes (Signed)
Remote pacemaker transmission.

## 2016-01-17 ENCOUNTER — Encounter: Payer: Self-pay | Admitting: Cardiology

## 2016-01-17 LAB — CUP PACEART REMOTE DEVICE CHECK
Battery Voltage: 3.03 V
Brady Statistic AP VP Percent: 0.15 %
Brady Statistic AP VS Percent: 89.36 %
Brady Statistic AS VP Percent: 0.2 %
Brady Statistic AS VS Percent: 10.29 %
Date Time Interrogation Session: 20170214065837
Implantable Lead Implant Date: 20151103
Implantable Lead Location: 753860
Implantable Lead Model: 5076
Lead Channel Impedance Value: 513 Ohm
Lead Channel Pacing Threshold Amplitude: 0.5 V
Lead Channel Pacing Threshold Pulse Width: 0.4 ms
Lead Channel Sensing Intrinsic Amplitude: 5 mV
Lead Channel Sensing Intrinsic Amplitude: 9.125 mV
Lead Channel Setting Pacing Amplitude: 2 V
Lead Channel Setting Pacing Pulse Width: 0.4 ms
Lead Channel Setting Sensing Sensitivity: 2.8 mV
MDC IDC LEAD IMPLANT DT: 20151103
MDC IDC LEAD LOCATION: 753859
MDC IDC MSMT BATTERY REMAINING LONGEVITY: 117 mo
MDC IDC MSMT LEADCHNL RA IMPEDANCE VALUE: 418 Ohm
MDC IDC MSMT LEADCHNL RA IMPEDANCE VALUE: 551 Ohm
MDC IDC MSMT LEADCHNL RA SENSING INTR AMPL: 5 mV
MDC IDC MSMT LEADCHNL RV IMPEDANCE VALUE: 532 Ohm
MDC IDC MSMT LEADCHNL RV SENSING INTR AMPL: 9.125 mV
MDC IDC SET LEADCHNL RA PACING AMPLITUDE: 1.5 V
MDC IDC STAT BRADY RA PERCENT PACED: 89.51 %
MDC IDC STAT BRADY RV PERCENT PACED: 0.35 %

## 2016-01-17 NOTE — Telephone Encounter (Signed)
Pt is very concerned,still have not gotten a signature from Dr Claiborne Billings. She had her test in December.

## 2016-01-18 DIAGNOSIS — R05 Cough: Secondary | ICD-10-CM | POA: Diagnosis not present

## 2016-01-19 ENCOUNTER — Telehealth: Payer: Self-pay | Admitting: Cardiovascular Disease

## 2016-01-19 NOTE — Telephone Encounter (Signed)
Mariann Laster handling this order.

## 2016-01-19 NOTE — Telephone Encounter (Signed)
Discussed w/ Mariann Laster - this will be signed on Monday.

## 2016-01-19 NOTE — Telephone Encounter (Signed)
I do not know where to sign

## 2016-01-19 NOTE — Telephone Encounter (Signed)
New message      Calling to let [name] [name] know that you will have to call your superuser because she does not know the answer to your question regarding putting an order in epic.

## 2016-01-25 ENCOUNTER — Telehealth: Payer: Self-pay | Admitting: Cardiology

## 2016-01-25 NOTE — Telephone Encounter (Signed)
[name] [name], please call this patient. I think she just got a machine. If so she will need to be scheduled out 2 months from the time that she got the machine in a sleep clinic.

## 2016-01-25 NOTE — Telephone Encounter (Signed)
Yes ma'am I will call her today and take care of it

## 2016-01-25 NOTE — Telephone Encounter (Signed)
New message   Pt calling for a sleep study appt with Claiborne Billings  Please call pt

## 2016-01-26 DIAGNOSIS — Z0000 Encounter for general adult medical examination without abnormal findings: Secondary | ICD-10-CM | POA: Diagnosis not present

## 2016-01-26 DIAGNOSIS — R350 Frequency of micturition: Secondary | ICD-10-CM | POA: Diagnosis not present

## 2016-01-26 DIAGNOSIS — R300 Dysuria: Secondary | ICD-10-CM | POA: Diagnosis not present

## 2016-02-21 ENCOUNTER — Telehealth: Payer: Self-pay | Admitting: Cardiovascular Disease

## 2016-02-21 NOTE — Telephone Encounter (Signed)
No atrial fibrillation since 01/16/16 remote transmission. HR histograms show highest ventricular rates in the 130-140bpm "bin". Spoke with patient and she is very excited by this finding. She was not symptomatic when her HRs were elevated. I explained that gym equipment might not read her HR accurately. She verbalizes understanding.

## 2016-02-21 NOTE — Telephone Encounter (Signed)
I'll be keeping my eyes open. Please make sure the device clinic knows that she is sending it

## 2016-02-21 NOTE — Telephone Encounter (Signed)
New Message  Pt stated that at the gym, the machine she was on recorded her HR @ 198. Please call back and discuss.   If home # unavailable please use cell #- [phone]

## 2016-02-21 NOTE — Telephone Encounter (Signed)
Spoke w/ pt and she is going to send a manual transmission. She wants to know if she had any Afib when she was at the gym.

## 2016-03-04 DIAGNOSIS — Z1231 Encounter for screening mammogram for malignant neoplasm of breast: Secondary | ICD-10-CM | POA: Diagnosis not present

## 2016-03-04 DIAGNOSIS — Z803 Family history of malignant neoplasm of breast: Secondary | ICD-10-CM | POA: Diagnosis not present

## 2016-03-07 ENCOUNTER — Other Ambulatory Visit: Payer: Self-pay | Admitting: Neurology

## 2016-03-07 ENCOUNTER — Ambulatory Visit (HOSPITAL_COMMUNITY)
Admission: RE | Admit: 2016-03-07 | Discharge: 2016-03-07 | Disposition: A | Discharge status: Home / Self Care | Payer: Medicare Other | Source: Ambulatory Visit | Attending: Cardiovascular Disease | Admitting: Cardiovascular Disease

## 2016-03-07 DIAGNOSIS — I10 Essential (primary) hypertension: Secondary | ICD-10-CM | POA: Insufficient documentation

## 2016-03-07 DIAGNOSIS — Z8673 Personal history of transient ischemic attack (TIA), and cerebral infarction without residual deficits: Secondary | ICD-10-CM | POA: Insufficient documentation

## 2016-03-07 DIAGNOSIS — Z87891 Personal history of nicotine dependence: Secondary | ICD-10-CM | POA: Diagnosis not present

## 2016-03-07 DIAGNOSIS — I6523 Occlusion and stenosis of bilateral carotid arteries: Secondary | ICD-10-CM | POA: Diagnosis not present

## 2016-03-08 ENCOUNTER — Other Ambulatory Visit: Payer: Self-pay

## 2016-03-08 ENCOUNTER — Telehealth: Payer: Self-pay

## 2016-03-08 MED ORDER — METOPROLOL TARTRATE 25 MG PO TABS: 25.0000 mg | ORAL_TABLET | Freq: Two times a day (BID) | ORAL | Status: DC

## 2016-03-08 NOTE — Telephone Encounter (Signed)
-----  Message from Pieter Partridge, DO sent at 03/08/2016  7:22 AM EDT ----- Carotid doppler looks okay

## 2016-03-08 NOTE — Telephone Encounter (Signed)
[name], MD at 08/23/2015 1:33 PM  Ordered Medications       Disp Refills Start End    metoprolol tartrate (LOPRESSOR) 25 MG tablet 60 tablet 6 08/23/2015     Take 1 tablet (25 mg total) by mouth 2 (two) times daily. - Oral    Notes to Pharmacy: New medication    Patient Instructions     Medication Instructions:  Your physician has recommended you make the following change in your medication:  1) START Metoprolol 25 mg twice a day

## 2016-03-08 NOTE — Telephone Encounter (Signed)
Message relayed to patient. Verbalized understanding and denied questions.   

## 2016-03-15 LAB — HM MAMMOGRAPHY: HM MAMMO: NORMAL (ref 0–4)

## 2016-03-27 ENCOUNTER — Telehealth: Payer: Self-pay | Admitting: Cardiovascular Disease

## 2016-03-27 NOTE — Telephone Encounter (Signed)
New message       What dental office are you calling from? [facility]. What is your office phone and fax number?  Fax [phone]  What type of procedure is the patient having performed? cleaning What date is procedure scheduled? Not scheduled 2. What is your question (ex. Antibiotics prior to procedure, holding medication-we need to know how long dentist wants pt to hold med)? Need to know if pt needs an antibiotic for cleaning, extractions, etc.  Need a note to have in her chart stating antibiotic requirements

## 2016-03-27 NOTE — Telephone Encounter (Signed)
Please advise.  Routing to Dr. Sallyanne Kuster.

## 2016-03-28 ENCOUNTER — Encounter: Payer: Self-pay | Admitting: Cardiovascular Disease

## 2016-03-28 NOTE — Telephone Encounter (Signed)
Provider not in epic. Addressed letter to PCP. Please fax to [facility]

## 2016-03-28 NOTE — Telephone Encounter (Signed)
Clearance letter printed and faxed to [facility]

## 2016-04-15 ENCOUNTER — Ambulatory Visit (INDEPENDENT_AMBULATORY_CARE_PROVIDER_SITE_OTHER): Payer: Medicare Other | Admitting: *Deleted

## 2016-04-15 ENCOUNTER — Telehealth: Payer: Self-pay | Admitting: Cardiology

## 2016-04-15 DIAGNOSIS — I495 Sick sinus syndrome: Secondary | ICD-10-CM

## 2016-04-15 NOTE — Progress Notes (Signed)
Remote pacemaker transmission.

## 2016-04-15 NOTE — Telephone Encounter (Signed)
Spoke with pt and reminded pt of remote transmission that is due today. Pt verbalized understanding.   

## 2016-04-16 DIAGNOSIS — R350 Frequency of micturition: Secondary | ICD-10-CM | POA: Diagnosis not present

## 2016-04-16 DIAGNOSIS — N390 Urinary tract infection, site not specified: Secondary | ICD-10-CM | POA: Diagnosis not present

## 2016-04-16 DIAGNOSIS — R300 Dysuria: Secondary | ICD-10-CM | POA: Diagnosis not present

## 2016-04-16 DIAGNOSIS — Z0000 Encounter for general adult medical examination without abnormal findings: Secondary | ICD-10-CM | POA: Diagnosis not present

## 2016-04-19 ENCOUNTER — Encounter: Payer: Self-pay | Admitting: Neurology

## 2016-04-19 ENCOUNTER — Telehealth: Payer: Self-pay | Admitting: Neurology

## 2016-04-19 ENCOUNTER — Ambulatory Visit (INDEPENDENT_AMBULATORY_CARE_PROVIDER_SITE_OTHER): Payer: Medicare Other | Admitting: Neurology

## 2016-04-19 VITALS — BP 118/70 | HR 84 | Ht 67.0 in | Wt 256.0 lb

## 2016-04-19 DIAGNOSIS — I495 Sick sinus syndrome: Secondary | ICD-10-CM | POA: Diagnosis not present

## 2016-04-19 DIAGNOSIS — Z8673 Personal history of transient ischemic attack (TIA), and cerebral infarction without residual deficits: Secondary | ICD-10-CM

## 2016-04-19 DIAGNOSIS — I480 Paroxysmal atrial fibrillation: Secondary | ICD-10-CM | POA: Diagnosis not present

## 2016-04-19 DIAGNOSIS — I6523 Occlusion and stenosis of bilateral carotid arteries: Secondary | ICD-10-CM | POA: Diagnosis not present

## 2016-04-19 NOTE — Telephone Encounter (Signed)
[name] [name] 2041-11-29. She called and left a message for you to please call her back. I believe she said she was returning a cal. Her number is [phone]. Thank you

## 2016-04-19 NOTE — Progress Notes (Signed)
NEUROLOGY FOLLOW UP OFFICE NOTE  [name] [id number]  HISTORY OF PRESENT ILLNESS: [name] [name] is a 75 year old right-handed female with paroxysmal atrial fibrillation, tachy-brady syndrome, cardiac pacemaker, hypertension, GERD and history of stroke who follows up for history of TIA.  UPDATE: Repeat carotid doppler was performed on 03/07/16 and did not demonstrate hemodynamically significant stenosis.  Since 01/16/16 remote transmission, there has been no demonstrated atrial fibrillation.    She has been feeling well.  She denies dizzy spells.  HISTORY: On 03/08/14, she was admitted to [facility] for TIA.  She presented with left sided weakness and numbness with difficulty walking.  Symptoms resolved when she arrived at hospital.  CT of head was unremarkable.  MRI of brain was negative for acute stroke or bleed.  MRA of head showed mild stenosis of both PCAs.  Carotid doppler showed no hemodynamically significant ICA stenosis.  2D echo showed  LVEF 50-55%.  She was initially placed on Plavix.  Hgb A1c was 5.9.  LDL was 77.  After discharge, she followed up with cardiology for palpitations.  A loop recorder revealed recurrent episodes of paroxysmal atrial tachycardia and at least one episode of atrial fibrillation.  She was started on anticoagulation therapy and had a dual-chamber permanent pacemaker implanted for tachycardia-bradycardia syndrome.    On 06/25/15, she was in church when she developed vague episode of strange head sensation and lightheadedness.  It lasted only a few seconds.  However, she has been experiencing these spells off an on ever since.  She said she had a similar sensation after her TIA for quite a while and then it spontaneously resolved.  There was no associated facial droop, focal numbness or weakness, headache, or vertigo.  Interrogation of her pacemaker showed brief episode of nonsustained atrial tachycardia during the time of the spell, but no sustained atrial  tachycardia or atrial fibrillation.  She does report anxiety, some of it related to the health of her daughter who has alpha-1-antitrypsin deficiency.  She is on sertraline and thinks it may need to be increased.  PAST MEDICAL HISTORY: Past Medical History  Diagnosis Date  . Hypertension   . MVP (mitral valve prolapse)   . PVC's (premature ventricular contractions)   . Obesity   . Presence of permanent cardiac pacemaker   . Complication of anesthesia     "I woke up during 2 different procedures" (10/04/2014)  . Atrial fibrillation ([place])   . Stroke [facility]) 01/2014    denies deficits on 10/04/2014  . GERD (gastroesophageal reflux disease)   . Arthritis     "all over"  . Frequent UTI   . Tachy-brady syndrome ([place]) 10/2014  . Sinus arrest 10/2014    s/p Medtronic Advisa model [id number] serial number [id number]    MEDICATIONS: Current Outpatient Prescriptions on File Prior to Visit  Medication Sig Dispense Refill  . acetaminophen (TYLENOL) 325 MG tablet Take 325 mg by mouth daily as needed (pain).    Marland Kitchen apixaban (ELIQUIS) 5 MG TABS tablet Take 1 tablet (5 mg total) by mouth 2 (two) times daily. 42 tablet 0  . Biotin 5000 MCG CAPS Take 5,000 mcg by mouth daily.     . Calcium Carbonate-Vit D-Min (CALCIUM 1200 PO) Take 2,400 mg by mouth daily.     . cholecalciferol (VITAMIN D) 1000 UNITS tablet Take 1,000 Units by mouth daily.     Marland Kitchen Co-Enzyme Q10 100 MG CAPS Take 100 mg by mouth daily.     Marland Kitchen  diazepam (VALIUM) 5 MG tablet Take 5 mg by mouth as needed for anxiety.     . diphenhydrAMINE (BENADRYL) 50 MG tablet Take 0.5-1 tablets (25-50 mg total) by mouth every 6 (six) hours as needed for itching. 30 tablet 0  . folic acid (FOLVITE) 421 MCG tablet Take 400 mcg by mouth daily.    . Glucosamine-Chondroit-Vit C-Mn (GLUCOSAMINE CHONDR 1500 COMPLX PO) Take 1,500 mg by mouth daily.     . hydrocortisone cream 1 % Apply 1 application topically as needed for itching.    . loratadine (KLS ALLERCLEAR) 10 MG  tablet Take 10 mg by mouth daily.    Marland Kitchen MAGNESIUM CITRATE PO Take 250 mg by mouth daily. 1 tab daily    . meclizine (ANTIVERT) 25 MG tablet Take 25 mg by mouth as needed for dizziness.     . Menthol, Topical Analgesic, (BIOFREEZE EX) Apply 1 application topically as needed (hip pain).     . metoprolol tartrate (LOPRESSOR) 25 MG tablet Take 1 tablet (25 mg total) by mouth 2 (two) times daily. 60 tablet 3  . Resveratrol 250 MG CAPS Take 250 mg by mouth daily.    . Simethicone (GAS-X PO) Take 2 tablets by mouth daily as needed (bloating).     No current facility-administered medications on file prior to visit.    ALLERGIES: Allergies  Allergen Reactions  . Ancef [Cefazolin] Hives and Itching  . Caffeine Palpitations    FAMILY HISTORY: Family History  Problem Relation Age of Onset  . Heart failure Mother   . Stroke Father   . Heart failure Brother     SOCIAL HISTORY: Social History   Social History  . Marital Status: Married    Spouse Name: N/A  . Number of Children: 3  . Years of Education: college   Occupational History  . Not on file.   Social History Main Topics  . Smoking status: Former Smoker -- 2.00 packs/day for 18 years    Types: Cigarettes  . Smokeless tobacco: Never Used     Comment: "quit smoking in 1971"  . Alcohol Use: 0.0 oz/week    0 Standard drinks or equivalent per week     Comment: 10/04/2014 "might have a drink a couple times/yr"  . Drug Use: No  . Sexual Activity: No   Other Topics Concern  . Not on file   Social History Narrative   Patient lives alone in one story house.   Patient is right handed.   Patient has a college degree.   Patient is retired.    REVIEW OF SYSTEMS: Constitutional: No fevers, chills, or sweats, no generalized fatigue, change in appetite Eyes: No visual changes, double vision, eye pain Ear, nose and throat: No hearing loss, ear pain, nasal congestion, sore throat Cardiovascular: No chest pain,  palpitations Respiratory:  No shortness of breath at rest or with exertion, wheezes GastrointestinaI: No nausea, vomiting, diarrhea, abdominal pain, fecal incontinence Genitourinary:  No dysuria, urinary retention or frequency Musculoskeletal:  No neck pain, back pain Integumentary: No rash, pruritus, skin lesions Neurological: as above Psychiatric: No depression, insomnia, anxiety Endocrine: No palpitations, fatigue, diaphoresis, mood swings, change in appetite, change in weight, increased thirst Hematologic/Lymphatic:  No purpura, petechiae. Allergic/Immunologic: no itchy/runny eyes, nasal congestion, recent allergic reactions, rashes  PHYSICAL EXAM: Filed Vitals:   04/19/16 1531  BP: 118/70  Pulse: 84   General: No acute distress.  Patient appears well-groomed.   Head:  Normocephalic/atraumatic Eyes:  Fundi examined but not visualized  Neck: supple, no paraspinal tenderness, full range of motion Heart:  Regular rate and rhythm Lungs:  Clear to auscultation bilaterally Back: No paraspinal tenderness Neurological Exam: alert and oriented to person, place, and time. Attention span and concentration intact, recent and remote memory intact, fund of knowledge intact.  Speech fluent and not dysarthric, language intact.  CN II-XII intact. Bulk and tone normal, muscle strength 5/5 throughout.  Sensation to light touch, temperature and vibration intact.  Deep tendon reflexes 2+ throughout.  Finger to nose and heel to shin testing intact.  Gait normal,   IMPRESSION: Episodes of fatigue.  I don't suspect recurrent TIAs.  Tachy-brady syndrome History of TIA Hypertension Anxiety  PLAN: 1.  Continue Eliquis 2.  Continue care via cardiologist 3.  Blood pressure control 4.  Recommend statin therapy as managed by PCP with LDL goal less than 70 5.  Weight loss 6.  Follow up as needed.  28 minutes spent face to face with patient, over 50% spent discussing management.  Metta Clines, DO  CC:   Carol Ada, MD

## 2016-04-19 NOTE — Patient Instructions (Signed)
I am glad you are feeling well.  You can follow up with me as needed.

## 2016-04-30 ENCOUNTER — Encounter: Payer: Self-pay | Admitting: Cardiovascular Disease

## 2016-04-30 ENCOUNTER — Ambulatory Visit (INDEPENDENT_AMBULATORY_CARE_PROVIDER_SITE_OTHER): Payer: Medicare Other | Admitting: Cardiovascular Disease

## 2016-04-30 VITALS — BP 130/86 | HR 76 | Ht 67.0 in | Wt 258.8 lb

## 2016-04-30 DIAGNOSIS — G4733 Obstructive sleep apnea (adult) (pediatric): Secondary | ICD-10-CM

## 2016-04-30 DIAGNOSIS — I480 Paroxysmal atrial fibrillation: Secondary | ICD-10-CM

## 2016-04-30 DIAGNOSIS — I495 Sick sinus syndrome: Secondary | ICD-10-CM | POA: Diagnosis not present

## 2016-04-30 DIAGNOSIS — Z9989 Dependence on other enabling machines and devices: Principal | ICD-10-CM

## 2016-04-30 DIAGNOSIS — Z950 Presence of cardiac pacemaker: Secondary | ICD-10-CM

## 2016-04-30 NOTE — Patient Instructions (Signed)
Your physician wants you to follow-up in: 1 year or sooner for sleep  If needed. You will receive a reminder letter in the mail two months in advance. If you don't receive a letter, please call our office to schedule the follow-up appointment.

## 2016-05-03 LAB — CUP PACEART REMOTE DEVICE CHECK
Battery Remaining Longevity: 114 mo
Battery Voltage: 3.02 V
Brady Statistic AS VS Percent: 14.86 %
Date Time Interrogation Session: 20170515143424
Implantable Lead Implant Date: 20151103
Implantable Lead Location: 753859
Implantable Lead Location: 753860
Lead Channel Impedance Value: 399 Ohm
Lead Channel Impedance Value: 475 Ohm
Lead Channel Impedance Value: 513 Ohm
Lead Channel Pacing Threshold Amplitude: 0.625 V
Lead Channel Pacing Threshold Amplitude: 0.75 V
Lead Channel Pacing Threshold Pulse Width: 0.4 ms
Lead Channel Pacing Threshold Pulse Width: 0.4 ms
Lead Channel Sensing Intrinsic Amplitude: 4.375 mV
Lead Channel Setting Pacing Amplitude: 1.5 V
MDC IDC LEAD IMPLANT DT: 20151103
MDC IDC MSMT LEADCHNL RA SENSING INTR AMPL: 4.375 mV
MDC IDC MSMT LEADCHNL RV IMPEDANCE VALUE: 513 Ohm
MDC IDC MSMT LEADCHNL RV SENSING INTR AMPL: 6.125 mV
MDC IDC MSMT LEADCHNL RV SENSING INTR AMPL: 6.125 mV
MDC IDC SET LEADCHNL RV PACING AMPLITUDE: 2 V
MDC IDC SET LEADCHNL RV PACING PULSEWIDTH: 0.4 ms
MDC IDC SET LEADCHNL RV SENSING SENSITIVITY: 2.8 mV
MDC IDC STAT BRADY AP VP PERCENT: 0.04 %
MDC IDC STAT BRADY AP VS PERCENT: 84.98 %
MDC IDC STAT BRADY AS VP PERCENT: 0.12 %
MDC IDC STAT BRADY RA PERCENT PACED: 85.02 %
MDC IDC STAT BRADY RV PERCENT PACED: 0.16 %

## 2016-05-04 ENCOUNTER — Encounter: Payer: Self-pay | Admitting: Cardiovascular Disease

## 2016-05-04 DIAGNOSIS — Z9989 Dependence on other enabling machines and devices: Principal | ICD-10-CM

## 2016-05-04 DIAGNOSIS — G4733 Obstructive sleep apnea (adult) (pediatric): Secondary | ICD-10-CM | POA: Insufficient documentation

## 2016-05-04 NOTE — Progress Notes (Signed)
[name], female   DOB: 1941-08-30, 75 y.o.   MRN: [id number]    PCP: Reginia Naas, MD Cardiologist: Sanda Klein, MD Primary Electrophysiologist: Constance Haw, MD   HPI: [name] [name] is a 75 y.o. female the clinic evaluation following initiation of CPAP therapy.  [name] [name] has a history of paroxysmal atrial fibrillation, sinus node dysfunction with tachybradycardia syndrome and is status post implantation of a dual-chamber permanent pacemaker.  She has a history of PVCs, and prior stroke in 2015.  She had developed significant fatigue, snoring, and daytime sleepiness.  He was referred for a sleep study which was performed in a split-night protocol on 11/30/2015.  On the diagnostic portion of the study, she was found to have moderate obstructive sleep apnea with an AHI of 18.2 per hour.  However, she was unable to achieve any rim sleep on the baseline study, and the severity may be underestimated.  She had mildly reduced sleep efficiency.  There was oxygen desaturation to a nadir of 85%.  She had evidence for soft snoring, and throughout the protocol had occasional PVCs.  She underwent CPAP titration and was titrated up to 11 cm water pressure with resolution of snoring and with an excellent AHI.  [facility] is her DME supply company.  Since initiating CPAP therapy, her sleep quality has improved.  She now admits that she is dreaming and could not recall previously dreaming suggestive that she is now being able to achieve REM sleep.  She is using a nasal mask.  She typically goes to bed between 12:30 to 1 AM and wakes up at 6:30 and is only sleeping approximate 6 hours.   A download was obtained from 03/31/2016 through 04/29/2016.  This shows 100% of usage stays.  There is 97% of days greater than 4 hours use and she is meeting Medicare compliance standards.  Average usage stays, however, is 6 hours and 19 minutes.  Her AHI is excellent at 1.3  per hour.  There is evidence for increased leak at 41.6 L/m. Marland Kitchen  She states that she does at times have her mouth open and has not been using a chinstrap.  An Epworth Sleepiness Scale score was recalculated in the office today and this endorsed at 11 and shown below.   Epworth Sleepiness Scale: Situation   Chance of Dozing/Sleeping (0 = never , 1 = slight chance , 2 = moderate chance , 3 = high chance )   sitting and reading 3   watching TV 2   sitting inactive in a public place 1   being a passenger in a motor vehicle for an hour or more 1   lying down in the afternoon 3   sitting and talking to someone 0   sitting quietly after lunch (no alcohol) 1   while stopped for a few minutes in traffic as the driver 0   Total Score  11    Past Medical History  Diagnosis Date  . Hypertension   . MVP (mitral valve prolapse)   . PVC's (premature ventricular contractions)   . Obesity   . Presence of permanent cardiac pacemaker   . Complication of anesthesia     "I woke up during 2 different procedures" (10/04/2014)  . Atrial fibrillation ([place])   . Stroke [facility]) 01/2014    denies deficits on 10/04/2014  . GERD (gastroesophageal reflux disease)   . Arthritis     "all over"  . Frequent UTI   .  Tachy-brady syndrome ([place]) 10/2014  . Sinus arrest 10/2014    s/p Medtronic Advisa model [id number] serial number [id number]    Past Surgical History  Procedure Laterality Date  . Excision vaginal cyst      benign nodules  . Tubal ligation    . US echocardiography  05/15/2010    LA mildly dilated,mild mitral annular ca+, AOV mildly sclerotic, mild asymmetric LVH  . Nm myocar perf wall motion  12/18/2007    normal  . Insert / replace / remove pacemaker  10/04/2014    Medtronic Advisa model [id number] serial number [id number]  . Loop recorder implant N/A 04/19/2014    Procedure: LOOP RECORDER IMPLANT;  Surgeon: Sanda Klein, MD;  Location: [place] CATH LAB;  Service: Cardiovascular;  Laterality: N/A;  . Loop  recorder explant N/A 10/04/2014    Procedure: LOOP RECORDER EXPLANT;  Surgeon: Sanda Klein, MD;  Location: [place] CATH LAB;  Service: Cardiovascular;  Laterality: N/A;  . Permanent pacemaker insertion N/A 10/04/2014    Procedure: PERMANENT PACEMAKER INSERTION;  Surgeon: Sanda Klein, MD;  Location: [place] CATH LAB;  Service: Cardiovascular;  Laterality: N/A;    Allergies  Allergen Reactions  . Ancef [Cefazolin] Hives and Itching  . Caffeine Palpitations    Current Outpatient Prescriptions  Medication Sig Dispense Refill  . acetaminophen (TYLENOL) 325 MG tablet Take 325 mg by mouth daily as needed (pain).    Marland Kitchen apixaban (ELIQUIS) 5 MG TABS tablet Take 1 tablet (5 mg total) by mouth 2 (two) times daily. 42 tablet 0  . Biotin 5000 MCG CAPS Take 5,000 mcg by mouth daily.     . Calcium Carbonate-Vit D-Min (CALCIUM 1200 PO) Take 2,400 mg by mouth daily.     . cholecalciferol (VITAMIN D) 1000 UNITS tablet Take 1,000 Units by mouth daily.     Marland Kitchen Co-Enzyme Q10 100 MG CAPS Take 100 mg by mouth daily.     . diazepam (VALIUM) 5 MG tablet Take 5 mg by mouth as needed for anxiety.     . diphenhydrAMINE (BENADRYL) 50 MG tablet Take 0.5-1 tablets (25-50 mg total) by mouth every 6 (six) hours as needed for itching. 30 tablet 0  . folic acid (FOLVITE) 401 MCG tablet Take 400 mcg by mouth daily.    . Glucosamine-Chondroit-Vit C-Mn (GLUCOSAMINE CHONDR 1500 COMPLX PO) Take 1,500 mg by mouth daily.     . hydrocortisone cream 1 % Apply 1 application topically as needed for itching.    . loratadine (KLS ALLERCLEAR) 10 MG tablet Take 10 mg by mouth daily.    Marland Kitchen MAGNESIUM CITRATE PO Take 250 mg by mouth daily. 1 tab daily    . meclizine (ANTIVERT) 25 MG tablet Take 25 mg by mouth as needed for dizziness.     . Menthol, Topical Analgesic, (BIOFREEZE EX) Apply 1 application topically as needed (hip pain).     . metoprolol tartrate (LOPRESSOR) 25 MG tablet Take 1 tablet (25 mg total) by mouth 2 (two) times daily. 60 tablet  3  . Resveratrol 250 MG CAPS Take 250 mg by mouth daily.    . sertraline (ZOLOFT) 100 MG tablet Take 100 mg by mouth. Take 1/2 tablet daily    . Simethicone (GAS-X PO) Take 2 tablets by mouth daily as needed (bloating).     No current facility-administered medications for this visit.    Social History   Social History  . Marital Status: Married    Spouse Name: N/A  . Number of Children: 3  .  Years of Education: college   Occupational History  . Not on file.   Social History Main Topics  . Smoking status: Former Smoker -- 2.00 packs/day for 18 years    Types: Cigarettes  . Smokeless tobacco: Never Used     Comment: "quit smoking in 1971"  . Alcohol Use: 0.0 oz/week    0 Standard drinks or equivalent per week     Comment: 10/04/2014 "might have a drink a couple times/yr"  . Drug Use: No  . Sexual Activity: No   Other Topics Concern  . Not on file   Social History Narrative   Patient lives alone in one story house.   Patient is right handed.   Patient has a college degree.   Patient is retired.    Family History  Problem Relation Age of Onset  . Heart failure Mother   . Stroke Father   . Heart failure Brother      ROS General: Negative; No fevers, chills, or night sweats HEENT: Negative; No changes in vision or hearing, sinus congestion, difficulty swallowing Pulmonary: Negative; No cough, wheezing, shortness of breath, hemoptysis Cardiovascular: Negative; No chest pain, presyncope, syncope, palpatations GI: Negative; No nausea, vomiting, diarrhea, or abdominal pain GU: Negative; No dysuria, hematuria, or difficulty voiding Musculoskeletal: Negative; no myalgias, joint pain, or weakness Hematologic: Negative; no easy bruising, bleeding Endocrine: Negative; no heat/cold intolerance Neuro: remote CVA Skin: Negative; No rashes or skin lesions Psychiatric: Negative; No behavioral problems, depression Sleep: See HPI, no bruxism, restless legs, hypnogognic  hallucinations, no cataplexy   Physical Exam BP 130/86 mmHg  Pulse 76  Ht _0  (1.702 m)  Wt 258 lb 12.8 oz (117.391 kg)  BMI 40.52 kg/m2  Morbid obesity.  Wt Readings from Last 3 Encounters:  04/30/16 258 lb 12.8 oz (117.391 kg)  04/19/16 256 lb (116.121 kg)  11/30/15 237 lb (107.502 kg)   General: Alert, oriented, no distress.  Skin: normal turgor, no rashes HEENT: Normocephalic, atraumatic. Pupils round and reactive; sclera anicteric; extraocular muscles intact; Fundi Without hemorrhages or exudates.  Disc flat. Nose without nasal septal hypertrophy Mouth/Parynx benign; Mallinpatti scale 3 Neck: No JVD, no carotid bruits Lungs: clear to ausculatation and percussion; no wheezing or rales  Chest wall: No tenderness to palpation Heart: RRR, s1 s2 normal; 1/6 systolic murmur.  No diastolic murmur.  No rubs thrills or heaves. Abdomen: soft, nontender; no hepatosplenomehaly, BS+; abdominal aorta nontender and not dilated by palpation. [name]: No CVA tenderness Pulses 2+ Extremities: no clubbinbg cyanosis or edema, Homan's sign negative  Neurologic: grossly nonfocal; cranial nerves intact. Psychological: Normal affect and mood.  ECG (independently read by me): Not done in the office today, but the 08/23/2015 ECG was reviewed which showed normal sinus rhythm at 75 with normal intervals.  LABS:  BMP Latest Ref Rng 09/28/2014 06/06/2014 03/09/2014  Glucose 70 - 99 mg/dL 82 95 98  BUN 6 - 23 mg/dL _1 Creatinine 0.50 - 1.10 mg/dL 0.86 1.00 0.87  Sodium 135 - 145 mEq/L 139 138 144  Potassium 3.5 - 5.3 mEq/L 4.6 4.4 5.2  Chloride 96 - 112 mEq/L 103 101 104  CO2 19 - 32 mEq/L _2 Calcium 8.4 - 10.5 mg/dL 8.9 9.6 9.1     Hepatic Function Latest Ref Rng 09/28/2014 03/09/2014 03/08/2014  Total Protein 6.0 - 8.3 g/dL 6.8 6.9 7.4  Albumin 3.5 - 5.2 g/dL 3.9 3.4(L) 3.5  AST 0 - 37 U/L 19 22 22  ALT 0 - 35 U/L _0 Alk Phosphatase 39 - 117 U/L 72 62 76  Total Bilirubin 0.2  - 1.2 mg/dL 0.4 0.3 <0.2(L)     CBC Latest Ref Rng 09/28/2014 03/09/2014 03/08/2014  WBC 4.0 - 10.5 K/uL 8.4 11.0(H) -  Hemoglobin 12.0 - 15.0 g/dL 14.7 14.8 17.0(H)  Hematocrit 36.0 - 46.0 % 44.5 43.8 50.0(H)  Platelets 150 - 400 K/uL 252 240 -   Lab Results  Component Value Date   MCV 87.8 09/28/2014   MCV 88.1 03/09/2014   MCV 88.0 03/08/2014    Lab Results  Component Value Date   TSH 1.529 06/06/2014    Lipid Panel     Component Value Date/Time   CHOL 152 03/09/2014 0440   TRIG 87 03/09/2014 0440   HDL 58 03/09/2014 0440   CHOLHDL 2.6 03/09/2014 0440   VLDL 17 03/09/2014 0440   LDLCALC 77 03/09/2014 0440     RADIOLOGY: No results found.   ASSESSMENT AND PLAN: [name] [name] is a 75 year old strip paroxysmal atrial fibrillation and is status post dual-chamber permanent pacemaker insertion for bradycardia tachycardia syndrome and sinus node dysfunction.  She is remote history of TIA.  I reviewed her sleep study in detail with her.  The diagnostic portion of her split-night protocol reveals moderate sleep apnea but I suspect this may be underestimated since she was unable to achieve any rim sleep on the diagnostic portion of the test.  There was evidence for oxygen desaturation to 85% with non-REM sleep.  She also had occasional PVCs.  Since initiating CPAP therapy and her current pressure of 11 cm water pressure. She has felt significantly improved since initiating therapy.  She is now dreaming vividly.  She is unaware of any breakthrough snoring.  She's is meeting Medicare compliance standards.  However, she still is mildly sleepy during the day.  I suspect this is due to inadequate sleep duration and that she is only sleeping  6 hours per night.  I discussed with her optimal sleep duration for an adult is 7-8 hours.  Her AHI is excellent on her current pressure.  However, she admits to oftentimes breathing through her mouth.  I will order a chinstrap to be help with her nasal  mask to prevent oral venting.  I discussed her morbid obesity and its impact on sleep apnea.  We also discussed the importance of continued CPAP compliance and reviewed with her data demonstrating a doubling of risk of recurrent atrial fibrillation if her CPAP is left untreated versus if she was using CPAP.  Discussed normal sleep architecture and the impact that her sleep apnea had on her previous abnormal sleep architecture, and absence of REM sleep.  She denies any awareness of recurrent arrhythmias on CPAP therapy.  He denies any chest pain.  She denies any neurologic symptoms.  Per Medicare guidelines, I will see her one year for follow-up evaluation.  Time spent: 25 minutes  Troy Sine, MD, [facility]  05/04/2016 12:57 PM

## 2016-05-06 ENCOUNTER — Encounter: Payer: Self-pay | Admitting: Cardiology

## 2016-05-15 ENCOUNTER — Encounter: Payer: Self-pay | Admitting: Cardiology

## 2016-05-20 ENCOUNTER — Other Ambulatory Visit: Payer: Self-pay | Admitting: *Deleted

## 2016-05-20 MED ORDER — METOPROLOL TARTRATE 25 MG PO TABS: 25.0000 mg | ORAL_TABLET | Freq: Two times a day (BID) | ORAL | Status: DC

## 2016-05-24 ENCOUNTER — Encounter: Payer: Self-pay | Admitting: General Practice

## 2016-05-24 DIAGNOSIS — R1310 Dysphagia, unspecified: Secondary | ICD-10-CM | POA: Diagnosis not present

## 2016-05-24 DIAGNOSIS — K219 Gastro-esophageal reflux disease without esophagitis: Secondary | ICD-10-CM | POA: Diagnosis not present

## 2016-06-01 ENCOUNTER — Other Ambulatory Visit: Payer: Self-pay | Admitting: Cardiovascular Disease

## 2016-06-01 LAB — HM DEXA SCAN: HM DEXA SCAN: NORMAL

## 2016-06-03 ENCOUNTER — Telehealth: Payer: Self-pay | Admitting: Cardiovascular Disease

## 2016-06-03 MED ORDER — APIXABAN 5 MG PO TABS: 5.0000 mg | ORAL_TABLET | Freq: Two times a day (BID) | ORAL | Status: DC

## 2016-06-03 NOTE — Telephone Encounter (Signed)
*  STAT* If patient is at the pharmacy, call can be transferred to refill team.   1. Which medications need to be refilled? (please list name of each medication and dose if known) Eliquis 5 mg   2. Which pharmacy/location (including street and city if local pharmacy) is medication to be sent to? [place]   3. Do they need a 30 day or 90 day supply? n/a

## 2016-06-03 NOTE — Telephone Encounter (Signed)
Refill sent.

## 2016-06-10 DIAGNOSIS — N958 Other specified menopausal and perimenopausal disorders: Secondary | ICD-10-CM | POA: Diagnosis not present

## 2016-06-13 ENCOUNTER — Telehealth: Payer: Self-pay | Admitting: *Deleted

## 2016-06-13 NOTE — Telephone Encounter (Signed)
Faxed for supporting documentation for CPAP.  04/30/16 office note faxed to [facility].

## 2016-06-17 ENCOUNTER — Telehealth: Payer: Self-pay

## 2016-06-21 NOTE — Telephone Encounter (Signed)
Patient to have colonoscopy and EGD with Dr Earlean Shawl.   Clearance given to pharmacist for review.  "Patient with history of TIA. Hold Eliquis for 2 days prior to procedure and restart the day after. Per office protocol."  Faxed clearance to [facility] on 06/21/2016.

## 2016-07-11 DIAGNOSIS — K641 Second degree hemorrhoids: Secondary | ICD-10-CM | POA: Diagnosis not present

## 2016-07-11 DIAGNOSIS — Z1211 Encounter for screening for malignant neoplasm of colon: Secondary | ICD-10-CM | POA: Diagnosis not present

## 2016-07-11 DIAGNOSIS — Z86010 Personal history of colonic polyps: Secondary | ICD-10-CM | POA: Diagnosis not present

## 2016-07-11 DIAGNOSIS — R1310 Dysphagia, unspecified: Secondary | ICD-10-CM | POA: Diagnosis not present

## 2016-07-11 DIAGNOSIS — K219 Gastro-esophageal reflux disease without esophagitis: Secondary | ICD-10-CM | POA: Diagnosis not present

## 2016-07-11 DIAGNOSIS — Z09 Encounter for follow-up examination after completed treatment for conditions other than malignant neoplasm: Secondary | ICD-10-CM | POA: Diagnosis not present

## 2016-07-11 DIAGNOSIS — K5730 Diverticulosis of large intestine without perforation or abscess without bleeding: Secondary | ICD-10-CM | POA: Diagnosis not present

## 2016-07-11 LAB — HM COLONOSCOPY: HM COLON: NORMAL

## 2016-07-12 ENCOUNTER — Encounter: Payer: Self-pay | Admitting: Emergency Medicine

## 2016-07-12 ENCOUNTER — Telehealth: Payer: Self-pay | Admitting: Emergency Medicine

## 2016-07-12 NOTE — Telephone Encounter (Signed)
Pre-Visit Call completed with patient and chart updated.   Pre-Visit Info documented in Specialty Comments under [employer].

## 2016-07-15 ENCOUNTER — Ambulatory Visit (INDEPENDENT_AMBULATORY_CARE_PROVIDER_SITE_OTHER): Payer: Medicare Other | Admitting: Family Medicine

## 2016-07-15 ENCOUNTER — Encounter: Payer: Self-pay | Admitting: Family Medicine

## 2016-07-15 ENCOUNTER — Encounter: Payer: Self-pay | Admitting: General Practice

## 2016-07-15 VITALS — BP 126/83 | HR 80 | Temp 97.9°F | Resp 16 | Ht 67.0 in | Wt 248.1 lb

## 2016-07-15 DIAGNOSIS — I6523 Occlusion and stenosis of bilateral carotid arteries: Secondary | ICD-10-CM | POA: Diagnosis not present

## 2016-07-15 DIAGNOSIS — F411 Generalized anxiety disorder: Secondary | ICD-10-CM | POA: Diagnosis not present

## 2016-07-15 DIAGNOSIS — I10 Essential (primary) hypertension: Secondary | ICD-10-CM | POA: Diagnosis not present

## 2016-07-15 DIAGNOSIS — I480 Paroxysmal atrial fibrillation: Secondary | ICD-10-CM

## 2016-07-15 DIAGNOSIS — E669 Obesity, unspecified: Secondary | ICD-10-CM | POA: Diagnosis not present

## 2016-07-15 LAB — TSH: TSH: 2.7 u[IU]/mL (ref 0.35–4.50)

## 2016-07-15 LAB — HEPATIC FUNCTION PANEL
ALBUMIN: 3.9 g/dL (ref 3.5–5.2)
ALT: 17 U/L (ref 0–35)
AST: 21 U/L (ref 0–37)
Alkaline Phosphatase: 77 U/L (ref 39–117)
BILIRUBIN DIRECT: 0.1 mg/dL (ref 0.0–0.3)
TOTAL PROTEIN: 6.6 g/dL (ref 6.0–8.3)
Total Bilirubin: 0.4 mg/dL (ref 0.2–1.2)

## 2016-07-15 LAB — LIPID PANEL
Cholesterol: 163 mg/dL (ref 0–200)
HDL: 45.7 mg/dL (ref >39.00)
NonHDL: 117.18
TRIGLYCERIDES: 208 mg/dL — AB (ref 0.0–149.0)
Total CHOL/HDL Ratio: 4
VLDL: 41.6 mg/dL — AB (ref 0.0–40.0)

## 2016-07-15 LAB — CBC WITH DIFFERENTIAL/PLATELET
BASOS ABS: 0 10*3/uL (ref 0.0–0.1)
Basophils Relative: 0.5 % (ref 0.0–3.0)
Eosinophils Absolute: 0.3 10*3/uL (ref 0.0–0.7)
Eosinophils Relative: 3.9 % (ref 0.0–5.0)
HEMATOCRIT: 43.7 % (ref 36.0–46.0)
HEMOGLOBIN: 15 g/dL (ref 12.0–15.0)
LYMPHS PCT: 37.1 % (ref 12.0–46.0)
Lymphs Abs: 3.1 10*3/uL (ref 0.7–4.0)
MCHC: 34.4 g/dL (ref 30.0–36.0)
MCV: 84.1 fl (ref 78.0–100.0)
MONOS PCT: 12.5 % — AB (ref 3.0–12.0)
Monocytes Absolute: 1 10*3/uL (ref 0.1–1.0)
Neutro Abs: 3.9 10*3/uL (ref 1.4–7.7)
Neutrophils Relative %: 46 % (ref 43.0–77.0)
Platelets: 228 10*3/uL (ref 150.0–400.0)
RBC: 5.19 Mil/uL — AB (ref 3.87–5.11)
RDW: 13.1 % (ref 11.5–15.5)
WBC: 8.4 10*3/uL (ref 4.0–10.5)

## 2016-07-15 LAB — BASIC METABOLIC PANEL
BUN: 12 mg/dL (ref 6–23)
CALCIUM: 9.3 mg/dL (ref 8.4–10.5)
CO2: 31 meq/L (ref 19–32)
CREATININE: 0.91 mg/dL (ref 0.40–1.20)
Chloride: 103 mEq/L (ref 96–112)
GFR: 63.93 mL/min (ref >60.00)
GLUCOSE: 92 mg/dL (ref 70–99)
Potassium: 4.2 mEq/L (ref 3.5–5.1)
SODIUM: 140 meq/L (ref 135–145)

## 2016-07-15 LAB — LDL CHOLESTEROL, DIRECT: Direct LDL: 81 mg/dL

## 2016-07-15 NOTE — Patient Instructions (Addendum)
Schedule your complete physical in 6 months We'll notify you of your lab results and make any changes if needed Continue to work on healthy diet and regular exercise- this is very important!!  You can do it!!! Call with any questions or concerns Welcome!!  We're glad to have you!!!

## 2016-07-15 NOTE — Progress Notes (Signed)
Pre visit review using our clinic review tool, if applicable. No additional management support is needed unless otherwise documented below in the visit note

## 2016-07-15 NOTE — Progress Notes (Signed)
   Subjective:    [name], female    DOB: [birth year], 75 y.o.   MRN: [id number]  HPI New to establish.  Previous MD- Suann Larry.  Health Maintenance- UTD on pap and mammo w/ Dr Nori Riis, UTD on colonoscopy ([facility]).  UTD on immunizations.   PAF- chronic problem, following w/ Cards (Dr Sallyanne Kuster)  On Eliquis for anticoagulation.  Metoprolol for rate control.  Denies CP, + SOB.  No abnormal bleeding/bruising.  + palpitations.  Mild fatigue.  HTN- chronic problem, currently well controlled on Metoprolol daily.  Denies CP, + SOB.  No HAs, visual changes, edema.    Anxiety- chronic problem, on Sertraline daily and Valium prn.  Pt reports sxs are well controlled, 'i'm just a little high strung'.   Review of Systems For ROS see HPI     Objective:   Physical Exam  Constitutional: She is oriented to person, place, and time. She appears well-developed and well-nourished. No distress.  HENT:  Head: Normocephalic and atraumatic.  Eyes: Conjunctivae and EOM are normal. Pupils are equal, round, and reactive to light.  Neck: Normal range of motion. Neck supple. No thyromegaly present.  Cardiovascular: Normal rate, regular rhythm, normal heart sounds and intact distal pulses.   No murmur heard. Pulmonary/Chest: Effort normal and breath sounds normal. No respiratory distress.  Abdominal: Soft. She exhibits no distension. There is no tenderness.  Musculoskeletal: She exhibits no edema.  Lymphadenopathy:    She has no cervical adenopathy.  Neurological: She is alert and oriented to person, place, and time.  Skin: Skin is warm and dry.  Psychiatric: She has a normal mood and affect. Her behavior is normal.  Vitals reviewed.         Assessment & Plan:

## 2016-07-16 ENCOUNTER — Encounter: Payer: Self-pay | Admitting: General Practice

## 2016-07-17 NOTE — Assessment & Plan Note (Signed)
New to provider, ongoing for pt.  Well controlled on Metoprolol.  Asymptomatic.  Stressed need for healthy diet and regular exercise.  No med changes at this time.

## 2016-07-17 NOTE — Assessment & Plan Note (Signed)
New to provider, ongoing for pt.  Following w/ Cardiology.  On Metoprolol for rate control.  On Eliquis for anticoagulation.  Will follow along and assist as able

## 2016-07-17 NOTE — Assessment & Plan Note (Signed)
New to provider, ongoing for pt.  Stressed need for healthy diet and regular exercise.  Check labs to risk stratify.  Will follow.

## 2016-07-17 NOTE — Assessment & Plan Note (Signed)
New to provider, ongoing for pt.  Currently well controlled on Sertraline and Valium (prn).  No med changes at this time.  Will follow.

## 2016-07-30 ENCOUNTER — Encounter: Payer: Self-pay | Admitting: Cardiovascular Disease

## 2016-07-30 ENCOUNTER — Ambulatory Visit (INDEPENDENT_AMBULATORY_CARE_PROVIDER_SITE_OTHER): Payer: Medicare Other | Admitting: Cardiovascular Disease

## 2016-07-30 VITALS — BP 117/75 | HR 84 | Ht 67.0 in | Wt 251.6 lb

## 2016-07-30 DIAGNOSIS — I480 Paroxysmal atrial fibrillation: Secondary | ICD-10-CM

## 2016-07-30 DIAGNOSIS — I471 Supraventricular tachycardia: Secondary | ICD-10-CM | POA: Diagnosis not present

## 2016-07-30 DIAGNOSIS — Z950 Presence of cardiac pacemaker: Secondary | ICD-10-CM | POA: Diagnosis not present

## 2016-07-30 DIAGNOSIS — Z6835 Body mass index (BMI) 35.0-35.9, adult: Secondary | ICD-10-CM

## 2016-07-30 DIAGNOSIS — R0602 Shortness of breath: Secondary | ICD-10-CM | POA: Diagnosis not present

## 2016-07-30 DIAGNOSIS — I455 Other specified heart block: Secondary | ICD-10-CM | POA: Diagnosis not present

## 2016-07-30 DIAGNOSIS — I495 Sick sinus syndrome: Secondary | ICD-10-CM

## 2016-07-30 DIAGNOSIS — Z7901 Long term (current) use of anticoagulants: Secondary | ICD-10-CM

## 2016-07-30 DIAGNOSIS — I6523 Occlusion and stenosis of bilateral carotid arteries: Secondary | ICD-10-CM

## 2016-07-30 LAB — CUP PACEART INCLINIC DEVICE CHECK
Battery Voltage: 3.02 V
Brady Statistic RA Percent Paced: 86.88 %
Date Time Interrogation Session: 20170829152445
Implantable Lead Location: 753860
Implantable Lead Model: 5076
Implantable Lead Model: 5076
Lead Channel Pacing Threshold Pulse Width: 0.4 ms
Lead Channel Pacing Threshold Pulse Width: 0.4 ms
Lead Channel Sensing Intrinsic Amplitude: 4.375 mV
Lead Channel Setting Pacing Pulse Width: 0.4 ms
Lead Channel Setting Sensing Sensitivity: 2.8 mV
MDC IDC LEAD IMPLANT DT: 20151103
MDC IDC LEAD IMPLANT DT: 20151103
MDC IDC LEAD LOCATION: 753859
MDC IDC MSMT BATTERY REMAINING LONGEVITY: 105 mo
MDC IDC MSMT LEADCHNL RA IMPEDANCE VALUE: 399 Ohm
MDC IDC MSMT LEADCHNL RA IMPEDANCE VALUE: 513 Ohm
MDC IDC MSMT LEADCHNL RA PACING THRESHOLD AMPLITUDE: 0.625 V
MDC IDC MSMT LEADCHNL RA SENSING INTR AMPL: 4.375 mV
MDC IDC MSMT LEADCHNL RV IMPEDANCE VALUE: 494 Ohm
MDC IDC MSMT LEADCHNL RV IMPEDANCE VALUE: 532 Ohm
MDC IDC MSMT LEADCHNL RV PACING THRESHOLD AMPLITUDE: 0.5 V
MDC IDC MSMT LEADCHNL RV SENSING INTR AMPL: 7.625 mV
MDC IDC MSMT LEADCHNL RV SENSING INTR AMPL: 7.625 mV
MDC IDC SET LEADCHNL RA PACING AMPLITUDE: 1.5 V
MDC IDC SET LEADCHNL RV PACING AMPLITUDE: 2 V
MDC IDC STAT BRADY AP VP PERCENT: 0.09 %
MDC IDC STAT BRADY AP VS PERCENT: 86.79 %
MDC IDC STAT BRADY AS VP PERCENT: 0.06 %
MDC IDC STAT BRADY AS VS PERCENT: 13.06 %
MDC IDC STAT BRADY RV PERCENT PACED: 0.15 %

## 2016-07-30 NOTE — Progress Notes (Signed)
Cardiology Office Note    Date:  07/31/2016   ID:  [name] [name], DOB 05-Sep-1941, MRN [id number]  PCP:  Annye Asa, MD  Cardiologist:   Sanda Klein, MD   Chief Complaint  Patient presents with  . Follow-up    History of Present Illness:  [name] [name] is a 75 y.o. female with complaints of dyspnea and cough. She specifically inquires whether she has congestive heart failure.  Her echocardiogram in April 2015 showed evidence of diastolic dysfunction without elevated filling pressures. She has not had clinically evident congestive heart failure in the past. She does not have systemic hypertension or coronary artery disease. She has a history of paroxysmal atrial fibrillation and transient ischemic attack as well as tachycardia-bradycardia syndrome with sinus node arrest for which she received a dual-chamber permanent pacemaker roughly a year ago.  Her pacemaker is a dual chamber Medtronic device MRI conditional device. Battery voltage is 3.02 V with an estimated longevity of 8.5 years. Lead parameters are normal. She has 87% atrial pacing but does not have a ventricular pacing. Heart rate histogram is relatively favorable without clear evidence of need for adjustment of the sensor settings. Burden of atrial fibrillation is low at only 0.1%, primarily made up of a 7 hour episode of atrial fibrillation in January and a 3-1/2 hour episode in May. During atrial fibrillation there is good ventricular rate control at 70-80 bpm.   She is compliant with anticoagulation and has not had any bleeding problems or new neurological complaints. She denies angina pectoris at rest or with exertion, syncope, palpitations, leg edema, orthopnea or PND.   Past Medical History:  Diagnosis Date  . Alpha-1-antitrypsin deficiency carrier ([place])   . Arthritis    "all over"  . Atrial fibrillation ([place])   . Complication of anesthesia    "I woke up during 2 different procedures" (10/04/2014)  . Frequent  UTI   . GERD (gastroesophageal reflux disease)   . Hypertension   . MVP (mitral valve prolapse)   . Obesity   . Presence of permanent cardiac pacemaker   . PVC's (premature ventricular contractions)   . 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 Stroke [facility]) 01/2014   denies deficits on 10/04/2014  . Tachy-brady syndrome ([place]) 10/2014    Past Surgical History:  Procedure Laterality Date  . EXCISION VAGINAL CYST     benign nodules  . INSERT / REPLACE / REMOVE PACEMAKER  10/04/2014   Medtronic Advisa model [id number] serial number [id number]  . LOOP RECORDER EXPLANT N/A 10/04/2014   Procedure: LOOP RECORDER EXPLANT;  Surgeon: Sanda Klein, MD;  Location: [facility] CATH LAB;  Service: Cardiovascular;  Laterality: N/A;  . LOOP RECORDER IMPLANT N/A 04/19/2014   Procedure: LOOP RECORDER IMPLANT;  Surgeon: Sanda Klein, MD;  Location: [facility] CATH LAB;  Service: Cardiovascular;  Laterality: N/A;  . NM MYOCAR PERF WALL MOTION  12/18/2007   normal  . PERMANENT PACEMAKER INSERTION N/A 10/04/2014   Procedure: PERMANENT PACEMAKER INSERTION;  Surgeon: Sanda Klein, MD;  Location: [facility] CATH LAB;  Service: Cardiovascular;  Laterality: N/A;  . TUBAL LIGATION    . US ECHOCARDIOGRAPHY  05/15/2010   LA mildly dilated,mild mitral annular ca+, AOV mildly sclerotic, mild asymmetric LVH    Current Medications: Outpatient Medications Prior to Visit  Medication Sig Dispense Refill  . apixaban (ELIQUIS) 5 MG TABS tablet Take 1 tablet (5 mg total) by mouth 2 (two) times daily. 180 tablet  0  . Biotin 5000 MCG CAPS Take 5,000 mcg by mouth daily.     . Calcium Carbonate-Vit D-Min (CALCIUM 1200 PO) Take 2,400 mg by mouth daily.     . cholecalciferol (VITAMIN D) 1000 UNITS tablet Take 1,000 Units by mouth daily.     Marland Kitchen Co-Enzyme Q10 100 MG CAPS Take 100 mg by mouth daily.     . diazepam (VALIUM) 5 MG tablet Take 5 mg by mouth as needed for anxiety.     . folic acid (FOLVITE) 937 MCG  tablet Take 400 mcg by mouth daily.    Marland Kitchen loratadine (KLS ALLERCLEAR) 10 MG tablet Take 10 mg by mouth daily.    Marland Kitchen MAGNESIUM CITRATE PO Take 250 mg by mouth daily. 1 tab daily    . metoprolol tartrate (LOPRESSOR) 25 MG tablet Take 1 tablet (25 mg total) by mouth 2 (two) times daily. 180 tablet 1  . Resveratrol 250 MG CAPS Take 250 mg by mouth daily.    . sertraline (ZOLOFT) 100 MG tablet Take 100 mg by mouth. Take 1/2 tablet daily    . Simethicone (GAS-X PO) Take 2 tablets by mouth daily as needed (bloating).    . Glucosamine-Chondroit-Vit C-Mn (GLUCOSAMINE CHONDR 1500 COMPLX PO) Take 1,500 mg by mouth daily.     . Menthol, Topical Analgesic, (BIOFREEZE EX) Apply 1 application topically as needed (hip pain).      No facility-administered medications prior to visit.      Allergies:   Ancef [cefazolin] and Caffeine   Social History   Social History  . Marital status: Married    Spouse name: N/A  . Number of children: 3  . Years of education: college   Social History Main Topics  . Smoking status: Former Smoker    Packs/day: 2.00    Years: 18.00    Types: Cigarettes  . Smokeless tobacco: Never Used     Comment: "quit smoking in 1971"  . Alcohol use 0.0 oz/week     Comment: 10/04/2014 "might have a drink a couple times/yr"  . Drug use: No  . Sexual activity: No   Other Topics Concern  . None   Social History Narrative   Patient lives alone in one story house.   Patient is right handed.   Patient has a college degree.   Patient is retired.     Family History:  The patient's family history includes Heart attack in her mother; Heart failure in her brother and father; Stroke in her brother and father.   ROS:   Please see the history of present illness.    ROS All other systems reviewed and are negative.   PHYSICAL EXAM:   VS:  BP 117/75 (BP Location: Left Arm, Patient Position: Sitting, Cuff Size: Normal)   Pulse 84   Ht _0  (1.702 m)   Wt 251 lb 9.6 oz (114.1 kg)    SpO2 93%   BMI 39.41 kg/m    GEN: Well nourished, well developed, in no acute distress  HEENT: normal  Neck: no JVD, carotid bruits, or masses Cardiac: RRR; no murmurs, rubs, or gallops,no edema ,  LC left subclavian pacemaker site Respiratory:  clear to auscultation bilaterally, normal work of breathing GI: soft, nontender, nondistended, + BS MS: no deformity or atrophy  Skin: warm and dry, no rash Neuro:  Alert and Oriented x 3, Strength and sensation are intact Psych: euthymic mood, full affect  Wt Readings from Last 3 Encounters:  07/30/16 251 lb  9.6 oz (114.1 kg)  07/15/16 248 lb 2 oz (112.5 kg)  04/30/16 258 lb 12.8 oz (117.4 kg)      Studies/Labs Reviewed:   EKG:  EKG is ordered today.  The ekg ordered today demonstrates Atrial paced ventricular sensed rhythm with long AV delay of 264 ms, QTC 427 ms there is otherwise normal  Recent Labs: 07/15/2016: ALT 17; BUN 12; Creatinine, Ser 0.91; Hemoglobin 15.0; Platelets 228.0; Potassium 4.2; Sodium 140; TSH 2.70   Lipid Panel    Component Value Date/Time   CHOL 163 07/15/2016 1419   TRIG 208.0 (H) 07/15/2016 1419   HDL 45.70 07/15/2016 1419   CHOLHDL 4 07/15/2016 1419   VLDL 41.6 (H) 07/15/2016 1419   LDLCALC 138 10/25/2014   LDLDIRECT 81.0 07/15/2016 1419    ASSESSMENT:    1. PAF (paroxysmal atrial fibrillation) ([place])   2. Sinus arrest   3. Pacemaker   4. Shortness of breath   5. Long term current use of anticoagulant      PLAN:  In order of problems listed above:  1. AFib: Arrhythmia burden is low and is not temporally associated with her complaints of dyspnea. Continue anticoagulation. CHADSVasc 5 Age 35, gender, TIA). 2. SSS: I don't think her shortness of breath is due to sensor settings/chronotropic incompetence, but may consider further evaluation with a treadmill test. 3. PPM: Normal device function, remote downloads every 3 months and yearly office visits 4. Dyspnea: Check echo to evaluate diastolic  function and filling pressures. She could have diastolic heart failure. Alternatively, obesity and deconditioning may be responsible for her dyspnea 5. Anticoagulation: tolerating Eliquis well without bleeding complications. 6. Obesity: Strongly recommend weight loss which will help with her dyspnea and reduced the burden of arrhythmia.    Medication Adjustments/Labs and Tests Ordered: Current medicines are reviewed at length with the patient today.  Concerns regarding medicines are outlined above.  Medication changes, Labs and Tests ordered today are listed in the Patient Instructions below. Patient Instructions  Dr Sallyanne Kuster recommends that you continue on your current medications as directed. Please refer to the Current Medication list given to you today.  Your physician has requested that you have an echocardiogram. Echocardiography is a painless test that uses sound waves to create images of your heart. It provides your doctor with information about the size and shape of your heart and how well your heart's chambers and valves are working. This procedure takes approximately one hour. There are no restrictions for this procedure. This will be done at our [facility] location - [address], [address].  Remote monitoring is used to monitor your Pacemaker of ICD from home. This monitoring reduces the number of office visits required to check your device to one time per year. It allows [place] to keep an eye on the functioning of your device to ensure it is working properly. You are scheduled for a device check from home on Tuesday, November 28th, 2017. You may send your transmission at any time that day. If you have a wireless device, the transmission will be sent automatically. After your physician reviews your transmission, you will receive a postcard with your next transmission date.  Dr Sallyanne Kuster recommends that you schedule a follow-up appointment in 12 months with a pacemaker check. You will  receive a reminder letter in the mail two months in advance. If you don't receive a letter, please call our office to schedule the follow-up appointment.  If you need a refill on  your cardiac medications before your next appointment, please call your pharmacy.    Signed, Sanda Klein, MD  07/31/2016 1:51 PM    [facility] [place] [place], [place], [place]  [address] Phone: [phone]; Fax: [phone]

## 2016-07-30 NOTE — Patient Instructions (Addendum)
Dr Sallyanne Kuster recommends that you continue on your current medications as directed. Please refer to the Current Medication list given to you today.  Your physician has requested that you have an echocardiogram. Echocardiography is a painless test that uses sound waves to create images of your heart. It provides your doctor with information about the size and shape of your heart and how well your heart's chambers and valves are working. This procedure takes approximately one hour. There are no restrictions for this procedure. This will be done at our [facility] location - [address], [address].  Remote monitoring is used to monitor your Pacemaker of ICD from home. This monitoring reduces the number of office visits required to check your device to one time per year. It allows [place] to keep an eye on the functioning of your device to ensure it is working properly. You are scheduled for a device check from home on Tuesday, November 28th, 2017. You may send your transmission at any time that day. If you have a wireless device, the transmission will be sent automatically. After your physician reviews your transmission, you will receive a postcard with your next transmission date.  Dr Sallyanne Kuster recommends that you schedule a follow-up appointment in 12 months with a pacemaker check. You will receive a reminder letter in the mail two months in advance. If you don't receive a letter, please call our office to schedule the follow-up appointment.  If you need a refill on your cardiac medications before your next appointment, please call your pharmacy.

## 2016-08-13 ENCOUNTER — Other Ambulatory Visit: Payer: Self-pay

## 2016-08-13 ENCOUNTER — Ambulatory Visit (HOSPITAL_COMMUNITY): Payer: Medicare Other | Attending: Cardiology

## 2016-08-13 DIAGNOSIS — Z8673 Personal history of transient ischemic attack (TIA), and cerebral infarction without residual deficits: Secondary | ICD-10-CM | POA: Insufficient documentation

## 2016-08-13 DIAGNOSIS — I495 Sick sinus syndrome: Secondary | ICD-10-CM | POA: Insufficient documentation

## 2016-08-13 DIAGNOSIS — R0602 Shortness of breath: Secondary | ICD-10-CM | POA: Diagnosis not present

## 2016-08-13 DIAGNOSIS — R0600 Dyspnea, unspecified: Secondary | ICD-10-CM | POA: Diagnosis present

## 2016-08-13 DIAGNOSIS — I4891 Unspecified atrial fibrillation: Secondary | ICD-10-CM | POA: Insufficient documentation

## 2016-08-13 DIAGNOSIS — I071 Rheumatic tricuspid insufficiency: Secondary | ICD-10-CM | POA: Diagnosis not present

## 2016-08-13 DIAGNOSIS — I119 Hypertensive heart disease without heart failure: Secondary | ICD-10-CM | POA: Insufficient documentation

## 2016-08-13 DIAGNOSIS — E669 Obesity, unspecified: Secondary | ICD-10-CM | POA: Diagnosis not present

## 2016-08-13 DIAGNOSIS — Z6839 Body mass index (BMI) 39.0-39.9, adult: Secondary | ICD-10-CM | POA: Insufficient documentation

## 2016-08-19 ENCOUNTER — Telehealth: Payer: Self-pay | Admitting: Cardiovascular Disease

## 2016-08-19 NOTE — Telephone Encounter (Signed)
Reviewed w/ patient, results given, she voiced understanding and thanks.

## 2016-08-19 NOTE — Telephone Encounter (Signed)
Returning [name] [name]'s call from Thursday,it might have been about her test results.

## 2016-08-21 ENCOUNTER — Telehealth: Payer: Self-pay | Admitting: Cardiovascular Disease

## 2016-08-21 DIAGNOSIS — L720 Epidermal cyst: Secondary | ICD-10-CM | POA: Diagnosis not present

## 2016-08-21 NOTE — Telephone Encounter (Signed)
New message     Pt states she had an "episode" last Monday around  5-6pm.  She was very weak, no chest pain and she always is sob. Pt has a pacemaker.  Can you look and see if anything showed up on her pacemaker readings?  Please call before 11:30-----pt has an appt at noon.

## 2016-08-22 NOTE — Telephone Encounter (Signed)
New Message  Pt states she had an episode last Monday around 5-6 pm. She was very weak, no chest pain and she always is sob. Pt has a pacemaker. Can you look and see if anything showed up on her pacemaker readings?  Please f/u with pt.

## 2016-08-22 NOTE — Telephone Encounter (Signed)
Pt returned your call.

## 2016-08-23 ENCOUNTER — Telehealth: Payer: Self-pay | Admitting: Family Medicine

## 2016-08-23 NOTE — Telephone Encounter (Signed)
Ok to increase Zoloft based on the discussion she had w/ the Cardiology nurse to 1 tab daily but I would like her to have an appt next week to discuss her symptoms and make sure there's nothing else going on

## 2016-08-23 NOTE — Telephone Encounter (Signed)
Please advise cardiology has not returned call to pt in regards to her weakness, they are suppose to be looking into whether or not she has had a cardiac event with her pacemaker

## 2016-08-23 NOTE — Telephone Encounter (Signed)
Pt informed of PCP recommendations and appt made for Monday at 1pm.

## 2016-08-23 NOTE — Telephone Encounter (Signed)
Spoke with patient and informed her that per her transmission there where no episodes that would explain her reported symptoms. She expressed that she was feeling "down", listless, and maybe a little depressed. She asked if she could increase her zoloft to help with this. I encouraged her to call her primary care doctor and discuss this option. She verbalized understanding and will call with any other concerns.

## 2016-08-23 NOTE — Telephone Encounter (Signed)
Patient calling requesting to speak with Dr. Virgil Benedict assistant.  Patient declined to provide specifics about what she needed other than she feels weak a lot.    (Note:  It appears patient called cardiologist for this concern as well)

## 2016-08-26 ENCOUNTER — Encounter: Payer: Self-pay | Admitting: Family Medicine

## 2016-08-26 ENCOUNTER — Ambulatory Visit (INDEPENDENT_AMBULATORY_CARE_PROVIDER_SITE_OTHER): Payer: Medicare Other | Admitting: Family Medicine

## 2016-08-26 VITALS — BP 130/82 | HR 68 | Temp 98.1°F | Resp 16 | Ht 67.0 in | Wt 255.0 lb

## 2016-08-26 DIAGNOSIS — F411 Generalized anxiety disorder: Secondary | ICD-10-CM | POA: Diagnosis not present

## 2016-08-26 DIAGNOSIS — R05 Cough: Secondary | ICD-10-CM

## 2016-08-26 DIAGNOSIS — R059 Cough, unspecified: Secondary | ICD-10-CM

## 2016-08-26 DIAGNOSIS — I6523 Occlusion and stenosis of bilateral carotid arteries: Secondary | ICD-10-CM | POA: Diagnosis not present

## 2016-08-26 MED ORDER — SERTRALINE HCL 100 MG PO TABS: 100.0000 mg | ORAL_TABLET | Freq: Every day | ORAL | 1 refills | Status: DC

## 2016-08-26 NOTE — Patient Instructions (Signed)
Go to [facility] and get your chest xray Restart the Prilosec daily to improve reflux and hopefully the cough Continue with the full tab of Zoloft daily Keep up the good work on regular activity- just ease into it!!! Call with any questions or concerns Happy Fall!!!

## 2016-08-26 NOTE — Assessment & Plan Note (Signed)
Deteriorated.  Pt is having increased anxiety as she is fearful about her heart and 'being old'.  She reports, 'it's not hard for me to go dark'.  Her biggest fears are b/c she doesn't fully understand what her specialists are telling her but she's too embarrassed to ask.  Encouraged her to speak up b/c it is our job to make sure she understands.  This made her feel better.  She does notice mild improvement in her sxs since increasing to 1 tab daily

## 2016-08-26 NOTE — Progress Notes (Signed)
Pre visit review using our clinic review tool, if applicable. No additional management support is needed unless otherwise documented below in the visit note

## 2016-08-26 NOTE — Progress Notes (Signed)
   Subjective:    [name], female    DOB: 12-30-1940, 75 y.o.   MRN: [id number]  HPI Cough- pt reports she has had ongoing sxs x18 months.  Pt reports feeling well despite the cough.  Not at any certain time during the day.  No fevers.  Taking Claritin daily.  + GERD.  Anxiety- chronic problem, pt sent MyChart message asking to increase her Zoloft due to increased anxiety and depression.  Pt reports she is still able to get together w/ friends, enjoy herself.  Pt reports she is having a hard time adjusting to 'being old'.  Decreased motivation which is unusual for her.  Pt is dealing w/ her daughter's very serious illness.  Pt is anxious about her heart conditions   Review of Systems For ROS see HPI     Objective:   Physical Exam  Constitutional: She is oriented to person, place, and time. She appears well-developed and well-nourished. No distress.  obese  HENT:  Head: Normocephalic and atraumatic.  Eyes: Conjunctivae and EOM are normal. Pupils are equal, round, and reactive to light.  Neck: Normal range of motion. Neck supple. No thyromegaly present.  Cardiovascular: Normal rate, regular rhythm, normal heart sounds and intact distal pulses.   No murmur heard. Pulmonary/Chest: Effort normal and breath sounds normal. No respiratory distress.  Abdominal: Soft. She exhibits no distension. There is no tenderness.  Musculoskeletal: She exhibits no edema.  Lymphadenopathy:    She has no cervical adenopathy.  Neurological: She is alert and oriented to person, place, and time.  Skin: Skin is warm and dry.  Psychiatric: She has a normal mood and affect. Her behavior is normal.  Vitals reviewed.         Assessment & Plan:  Cough- pt reports 18 months of sxs.  Denies fever.  Reports otherwise feeling well.  She has hx of GERD and PND due to allergies.  On daily antihistamine.  Encouraged her to restart PPI.  Will get CXR to assess.  Reviewed supportive care and red flags  that should prompt return.  Pt expressed understanding and is in agreement w/ plan.

## 2016-09-02 ENCOUNTER — Ambulatory Visit (INDEPENDENT_AMBULATORY_CARE_PROVIDER_SITE_OTHER)
Admission: RE | Admit: 2016-09-02 | Discharge: 2016-09-02 | Disposition: A | Discharge status: Home / Self Care | Payer: Medicare Other | Source: Ambulatory Visit | Attending: Family Medicine | Admitting: Family Medicine

## 2016-09-02 DIAGNOSIS — R05 Cough: Secondary | ICD-10-CM

## 2016-09-02 DIAGNOSIS — R059 Cough, unspecified: Secondary | ICD-10-CM

## 2016-09-05 ENCOUNTER — Telehealth: Payer: Self-pay | Admitting: Cardiovascular Disease

## 2016-09-05 NOTE — Telephone Encounter (Signed)
New Message  Pt call requesting to speak with RN. Pt stats she went to pick up meds from the pharmacy and was to expensive. Pt states she can not afford $400. Pt would like a call back from the RN to discuss other alternatives for getting the medication. Please call back to discuss

## 2016-09-05 NOTE — Telephone Encounter (Signed)
[name], and she states she wanted to speak to [name] regarding a medication switch - notes this was in reference to a previous discussion about her medication costs. She prefers to speak to [name] only. Aware I will route for f/u.

## 2016-09-06 NOTE — Telephone Encounter (Signed)
[name]. Copay for Eliquis has gone up she requests samples to get her through. Samples provided.   Pt states appreciation.

## 2016-09-13 DIAGNOSIS — Z23 Encounter for immunization: Secondary | ICD-10-CM | POA: Diagnosis not present

## 2016-10-03 ENCOUNTER — Telehealth: Payer: Self-pay | Admitting: Cardiovascular Disease

## 2016-10-03 NOTE — Telephone Encounter (Signed)
Pt request Eliquis samples. Samples left at front desk.   Pt states appreciation.

## 2016-10-03 NOTE — Telephone Encounter (Signed)
New Message  Pt voiced wanting to speak to someone in coumadin.  Please f/u

## 2016-10-09 ENCOUNTER — Telehealth: Payer: Self-pay | Admitting: Pharmacist Clinician (PhC)/ Clinical Pharmacy Specialist

## 2016-10-09 MED ORDER — APIXABAN 5 MG PO TABS: 5.0000 mg | ORAL_TABLET | Freq: Two times a day (BID) | ORAL | 0 refills | Status: DC

## 2016-10-09 NOTE — Telephone Encounter (Signed)
Pt called, was given 3 weeks of Eliquis samples last week, lost 2 of the boxes.    Gave 2 more boxes today

## 2016-10-10 DIAGNOSIS — H0011 Chalazion right upper eyelid: Secondary | ICD-10-CM | POA: Diagnosis not present

## 2016-10-21 ENCOUNTER — Telehealth: Payer: Self-pay | Admitting: Pharmacist

## 2016-10-21 NOTE — Telephone Encounter (Signed)
Pt calling for Eliquis samples - samples provided.

## 2016-10-29 ENCOUNTER — Ambulatory Visit (INDEPENDENT_AMBULATORY_CARE_PROVIDER_SITE_OTHER): Payer: Medicare Other | Admitting: *Deleted

## 2016-10-29 ENCOUNTER — Telehealth: Payer: Self-pay | Admitting: Cardiology

## 2016-10-29 DIAGNOSIS — I495 Sick sinus syndrome: Secondary | ICD-10-CM

## 2016-10-29 NOTE — Telephone Encounter (Signed)
Spoke with pt and reminded pt of remote transmission that is due today. Pt verbalized understanding.   

## 2016-10-30 DIAGNOSIS — D225 Melanocytic nevi of trunk: Secondary | ICD-10-CM | POA: Diagnosis not present

## 2016-10-30 DIAGNOSIS — L821 Other seborrheic keratosis: Secondary | ICD-10-CM | POA: Diagnosis not present

## 2016-10-30 DIAGNOSIS — D485 Neoplasm of uncertain behavior of skin: Secondary | ICD-10-CM | POA: Diagnosis not present

## 2016-10-30 DIAGNOSIS — D2339 Other benign neoplasm of skin of other parts of face: Secondary | ICD-10-CM | POA: Diagnosis not present

## 2016-10-30 NOTE — Progress Notes (Signed)
Remote pacemaker transmission.   

## 2016-10-31 ENCOUNTER — Encounter: Payer: Self-pay | Admitting: Cardiology

## 2016-11-18 LAB — CUP PACEART REMOTE DEVICE CHECK
Battery Voltage: 3.02 V
Brady Statistic AP VP Percent: 0.07 %
Brady Statistic AS VP Percent: 0 %
Brady Statistic RA Percent Paced: 92.59 %
Brady Statistic RV Percent Paced: 0.07 %
Implantable Lead Implant Date: 20151103
Implantable Lead Location: 753860
Implantable Lead Model: 5076
Implantable Lead Model: 5076
Implantable Pulse Generator Implant Date: 20151103
Lead Channel Impedance Value: 437 Ohm
Lead Channel Impedance Value: 513 Ohm
Lead Channel Impedance Value: 551 Ohm
Lead Channel Pacing Threshold Amplitude: 0.75 V
Lead Channel Pacing Threshold Pulse Width: 0.4 ms
Lead Channel Sensing Intrinsic Amplitude: 6.875 mV
Lead Channel Setting Pacing Amplitude: 1.5 V
Lead Channel Setting Pacing Pulse Width: 0.4 ms
Lead Channel Setting Sensing Sensitivity: 2.8 mV
MDC IDC LEAD IMPLANT DT: 20151103
MDC IDC LEAD LOCATION: 753859
MDC IDC MSMT BATTERY REMAINING LONGEVITY: 103 mo
MDC IDC MSMT LEADCHNL RA IMPEDANCE VALUE: 532 Ohm
MDC IDC MSMT LEADCHNL RA SENSING INTR AMPL: 4.125 mV
MDC IDC MSMT LEADCHNL RA SENSING INTR AMPL: 4.125 mV
MDC IDC MSMT LEADCHNL RV PACING THRESHOLD AMPLITUDE: 0.5 V
MDC IDC MSMT LEADCHNL RV PACING THRESHOLD PULSEWIDTH: 0.4 ms
MDC IDC MSMT LEADCHNL RV SENSING INTR AMPL: 6.875 mV
MDC IDC SESS DTM: 20171128182432
MDC IDC SET LEADCHNL RV PACING AMPLITUDE: 2 V
MDC IDC STAT BRADY AP VS PERCENT: 92.52 %
MDC IDC STAT BRADY AS VS PERCENT: 7.41 %

## 2016-11-28 ENCOUNTER — Telehealth: Payer: Self-pay | Admitting: Family Medicine

## 2016-11-28 ENCOUNTER — Telehealth: Payer: Self-pay | Admitting: Internal Medicine

## 2016-11-28 MED ORDER — DOXYCYCLINE HYCLATE 100 MG PO TABS: ORAL_TABLET | ORAL | 0 refills | Status: DC

## 2016-11-28 MED ORDER — PREDNISONE 20 MG PO TABS: 20.0000 mg | ORAL_TABLET | Freq: Every day | ORAL | 0 refills | Status: DC

## 2016-11-28 NOTE — Telephone Encounter (Signed)
I absolutely recommend ER evaluation immediately.  In fact, I recommend EMS transport so she can get immediate O2 if needed and eliminate any waiting at ER.  I do not know how to be more clear in my recommendations.

## 2016-11-28 NOTE — Telephone Encounter (Signed)
Called and spoke with pt who advised that she is having SOB (which is worse than she normally is), and a blue discoloration to her lips. Pt stated that her daughter was urging her to be seen. pt was winded with conversation, but was trying to downplay her symptoms.    Pt was again advised that per PCP she needed ER for evaluation as se has multiple diagnosis that could be a cause for concern. Pt stated that she "didn't feel like sitting in the Er for hours" and she was going to call pulmonology to see if they have any appts for today.   Pt refused ER three times on our conversation.

## 2016-11-28 NOTE — Telephone Encounter (Signed)
Spoke with pt, who states she hasn't felt good the past 4-5d. Pt states she has had increased sob, prod cough with green mucus & wheezing. Pt denies fever, sweats or chills.  Pt states her daughter feels that pt needs to be seen. We do not have any availability the rest of this week.  CY please advise. Thanks.

## 2016-11-28 NOTE — Telephone Encounter (Signed)
Per [facility] call I approach dr Birdie Riddle with patient refusal of 911 outcome. Dr Birdie Riddle agreed with 911 outcome. I called the patient and advised of dr Tabori's advice, which is to call 911 so they can bring O2 to her. She refused and states this is normal for her and that she isnt going to go to the ER or call 911

## 2016-11-28 NOTE — Telephone Encounter (Signed)
Prednisone won't affect either Afib or her pacemaker

## 2016-11-28 NOTE — Telephone Encounter (Signed)
Encounter routed to PCP for FYI.

## 2016-11-28 NOTE — Telephone Encounter (Signed)
[facility] Patient Name: [name] [name] DOB: 04/18/1941 Initial Comment Caller states she's coughing up green stuff. She feels like she isn't get enough air. Nurse Assessment Nurse: Andria Frames, RN, Aeriel Date/Time Eilene Ghazi Time): 11/28/2016 2:21:28 PM Confirm and document reason for call. If symptomatic, describe symptoms. ---Caller states, she's coughing up green stuff. For the last four of 5 days she feels like she can't get enough air. This morning she started coughing up the green stuff. No fever. Does the patient have any new or worsening symptoms? ---Yes Will a triage be completed? ---Yes Related visit to physician within the last 2 weeks? ---No Does the PT have any chronic conditions? (i.e. diabetes, asthma, etc.) ---Yes List chronic conditions. ---hx of tia, has a pacemaker Is this a behavioral health or substance abuse call? ---No Guidelines Guideline Title Affirmed Question Affirmed Notes Cough - Acute Productive Bluish lips, tongue, or face now Final Disposition User Call EMS 911 Now Hensel, RN, Aeriel Disagree/Comply: Comply

## 2016-11-28 NOTE — Telephone Encounter (Signed)
Spoke with pt and advised of Dr Janee Morn recommendations.  Rx sent to pharmacy.  Nothing further needed.

## 2016-11-28 NOTE — Telephone Encounter (Signed)
Called and spoke with pt and she is aware of CY recs.  She wanted to make sure that CY is aware that she has a pacemaker and afib and she was worried about taking the prednisone.  CY please advise. Thanks  Allergies  Allergen Reactions  . Ancef [Cefazolin] Hives and Itching  . Caffeine Palpitations

## 2016-11-28 NOTE — Telephone Encounter (Signed)
Since no openings to be seen in office- Offer:                           Doxycycline 100 mg, # 8, 2 today then one daily                            prednisone 20 mg, # 5, 1 daily

## 2016-12-08 ENCOUNTER — Other Ambulatory Visit: Payer: Self-pay | Admitting: Cardiovascular Disease

## 2016-12-10 ENCOUNTER — Encounter: Payer: Self-pay | Admitting: Adult Health

## 2016-12-10 ENCOUNTER — Ambulatory Visit (INDEPENDENT_AMBULATORY_CARE_PROVIDER_SITE_OTHER): Payer: Medicare Other | Admitting: Adult Health

## 2016-12-10 ENCOUNTER — Other Ambulatory Visit (INDEPENDENT_AMBULATORY_CARE_PROVIDER_SITE_OTHER): Payer: Medicare Other

## 2016-12-10 ENCOUNTER — Other Ambulatory Visit: Payer: Self-pay | Admitting: *Deleted

## 2016-12-10 ENCOUNTER — Telehealth: Payer: Self-pay | Admitting: Cardiology

## 2016-12-10 ENCOUNTER — Ambulatory Visit (INDEPENDENT_AMBULATORY_CARE_PROVIDER_SITE_OTHER)
Admission: RE | Admit: 2016-12-10 | Discharge: 2016-12-10 | Disposition: A | Discharge status: Home / Self Care | Payer: Medicare Other | Source: Ambulatory Visit | Attending: Adult Health | Admitting: Adult Health

## 2016-12-10 VITALS — BP 118/60 | HR 80 | Temp 98.1°F | Ht 67.0 in | Wt 267.0 lb

## 2016-12-10 DIAGNOSIS — R0600 Dyspnea, unspecified: Secondary | ICD-10-CM

## 2016-12-10 DIAGNOSIS — I495 Sick sinus syndrome: Secondary | ICD-10-CM

## 2016-12-10 DIAGNOSIS — R0609 Other forms of dyspnea: Secondary | ICD-10-CM

## 2016-12-10 DIAGNOSIS — I471 Supraventricular tachycardia: Secondary | ICD-10-CM

## 2016-12-10 DIAGNOSIS — K219 Gastro-esophageal reflux disease without esophagitis: Secondary | ICD-10-CM

## 2016-12-10 DIAGNOSIS — I480 Paroxysmal atrial fibrillation: Secondary | ICD-10-CM | POA: Diagnosis not present

## 2016-12-10 LAB — CBC WITH DIFFERENTIAL/PLATELET
Basophils Absolute: 0 10*3/uL (ref 0.0–0.1)
Basophils Relative: 0.1 % (ref 0.0–3.0)
EOS PCT: 4.2 % (ref 0.0–5.0)
Eosinophils Absolute: 0.5 10*3/uL (ref 0.0–0.7)
HCT: 45.8 % (ref 36.0–46.0)
Hemoglobin: 15.8 g/dL — ABNORMAL HIGH (ref 12.0–15.0)
LYMPHS ABS: 2.7 10*3/uL (ref 0.7–4.0)
Lymphocytes Relative: 24.1 % (ref 12.0–46.0)
MCHC: 34.4 g/dL (ref 30.0–36.0)
MCV: 84.4 fl (ref 78.0–100.0)
MONOS PCT: 14.5 % — AB (ref 3.0–12.0)
Monocytes Absolute: 1.6 10*3/uL — ABNORMAL HIGH (ref 0.1–1.0)
NEUTROS ABS: 6.5 10*3/uL (ref 1.4–7.7)
NEUTROS PCT: 57.1 % (ref 43.0–77.0)
PLATELETS: 273 10*3/uL (ref 150.0–400.0)
RBC: 5.43 Mil/uL — AB (ref 3.87–5.11)
RDW: 13.3 % (ref 11.5–15.5)
WBC: 11.4 10*3/uL — ABNORMAL HIGH (ref 4.0–10.5)

## 2016-12-10 MED ORDER — METOPROLOL TARTRATE 25 MG PO TABS
25.0000 mg | ORAL_TABLET | Freq: Two times a day (BID) | ORAL | 2 refills | Status: DC
Start: 2016-12-10 — End: 2017-08-20

## 2016-12-10 NOTE — Assessment & Plan Note (Addendum)
Dypsnea progressive DOE over last several months ? Etiology  Spirometry shows normal lung function .  She has had a recent URI/Bronchitis s/p ABX and steorids - check  cxr today to r/o acute illness.  Check labs with CBC, bmet and BNP  EKG shows A Fib w/ controlled rate. -get in with cardiology to discuss/ rule out card source Have her return for for full PFT - ? Asthma component   Plan  Patient Instructions  Chest xray today .  Labs today .  Refer to cardiology .  Mucinex DM Twice daily  As needed  Cough/congestion .  Begin Prilosec 20m daily before meal .  GERD diet .  Stop oil supplement .  Follow up with Dr. YAnnamaria Boots In 2  months with PFT and As needed

## 2016-12-10 NOTE — Progress Notes (Signed)
_0  ID: [name] [name], female    DOB: [birth year], 76 y.o.   MRN: [id number]  Chief Complaint  Patient presents with  . Acute Visit    sob     Referring provider: Midge Minium, MD  HPI: 76 yo former smoker , quit age 30 (smoked on/off x 11  Yr 1-2 PPD ) , MZ alpha 1  Hx of A Fib, tachybrachy syndrome  , s/p pacemaker on Eliquis  Has OSA on CPAP At bedtime   Daughter has alpha 1 antitrypsin on proloastin   TEST  Spirometry 12/12/2016 >FEV1 94%, ratio 85, FVC 84% Echo 08/2016 EF 50-55%, gr 1 DD   12/12/2016 Acute OV  Pt present for an acute office visit. Complains of that breathing has been getting worse for last several months. Gets winded easily . Complains over last 2 weeks of increased sob , cough, , congestion with green mucus . Last night she had episode hard to catch breath that last 30-60 min and then went away . She was called in Doxcycyline and prednisone last week . She says congestion is better but still has come cough and dyspnea. Gets winded if walking long distance.  Last seen in 2008 with Dr. Annamaria Boots   CXR in 09/2016 showed clear .  Says she has bad heartburn that goes up in throat . Taking essential oils for GERD . Takes gas x . Has taken prilosec in past but not for good while .  Wt is up 50 lbs last 2 yr She has tightness intermittently in along chest , no exertional chest pain or syncope.  She is on Elquis w/ no missed doses.      Allergies  Allergen Reactions  . Ancef [Cefazolin] Hives and Itching  . Caffeine Palpitations    Immunization History  Administered Date(s) Administered  . Influenza Whole 09/23/2008  . Influenza-Unspecified 09/17/2014, 09/09/2016  . Pneumococcal Conjugate-13 10/16/2015  . Pneumococcal-Unspecified 10/01/2014    Past Medical History:  Diagnosis Date  . Alpha-1-antitrypsin deficiency carrier ([place])   . Arthritis    "all over"  . Atrial fibrillation ([place])   . Complication of anesthesia    "I woke up during 2  different procedures" (10/04/2014)  . Frequent UTI   . GERD (gastroesophageal reflux disease)   . Hypertension   . MVP (mitral valve prolapse)   . Obesity   . Presence of permanent cardiac pacemaker   . PVC's (premature ventricular contractions)   . 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 Stroke [facility]) 01/2014   denies deficits on 10/04/2014  . Tachy-brady syndrome ([place]) 10/2014    Tobacco History: History  Smoking Status  . Former Smoker  . Packs/day: 2.00  . Years: 18.00  . Types: Cigarettes  Smokeless Tobacco  . Never Used    Comment: "quit smoking in 1971"   Counseling given: Not Answered   Outpatient Encounter Prescriptions as of 12/10/2016  Medication Sig  . apixaban (ELIQUIS) 5 MG TABS tablet Take 1 tablet (5 mg total) by mouth 2 (two) times daily.  . Biotin 5000 MCG CAPS Take 5,000 mcg by mouth daily.   . Calcium Carbonate-Vit D-Min (CALCIUM 1200 PO) Take 2,400 mg by mouth daily.   . cholecalciferol (VITAMIN D) 1000 UNITS tablet Take 1,000 Units by mouth daily.   Marland Kitchen Co-Enzyme Q10 100 MG CAPS Take 100 mg by mouth daily.   . diazepam (VALIUM) 5 MG tablet Take 5 mg by  mouth as needed for anxiety.   . folic acid (FOLVITE) 993 MCG tablet Take 400 mcg by mouth daily.  Marland Kitchen glucosamine-chondroitin 500-400 MG tablet Take 1 tablet by mouth daily.   Marland Kitchen loratadine (KLS ALLERCLEAR) 10 MG tablet Take 10 mg by mouth daily.  Marland Kitchen MAGNESIUM CITRATE PO Take 250 mg by mouth daily. 1 tab daily  . Resveratrol 250 MG CAPS Take 250 mg by mouth daily.  . sertraline (ZOLOFT) 100 MG tablet Take 1 tablet (100 mg total) by mouth daily.  . Simethicone (GAS-X PO) Take 2 tablets by mouth daily as needed (bloating).  . [DISCONTINUED] metoprolol tartrate (LOPRESSOR) 25 MG tablet Take 1 tablet (25 mg total) by mouth 2 (two) times daily.  . [DISCONTINUED] doxycycline (VIBRA-TABS) 100 MG tablet Take 2 tablets today then 1 tablet daily until gone  . [DISCONTINUED]  ELIQUIS 5 MG TABS tablet TAKE ONE TABLET BY MOUTH TWICE DAILY  . [DISCONTINUED] predniSONE (DELTASONE) 20 MG tablet Take 1 tablet (20 mg total) by mouth daily with breakfast.   No facility-administered encounter medications on file as of 12/10/2016.      Review of Systems  Constitutional:   No  weight loss, night sweats,  Fevers, chills, + fatigue, or  lassitude.  HEENT:   No headaches,  Difficulty swallowing,  Tooth/dental problems, or  Sore throat,                No sneezing, itching, ear ache, nasal congestion, post nasal drip,   CV:  No chest pain,  Orthopnea, PND, swelling in lower extremities, anasarca, dizziness, palpitations, syncope.   GI  No   abdominal pain, nausea, vomiting, diarrhea, change in bowel habits, loss of appetite, bloody stools.   Resp   No coughing up of blood.  No change in color of mucus.  No wheezing.  No chest wall deformity  Skin: no rash or lesions.  GU: no dysuria, change in color of urine, no urgency or frequency.  No flank pain, no hematuria   MS:  No joint pain or swelling.  No decreased range of motion.  No back pain.    Physical Exam  BP 118/60   Pulse 80   Temp 98.1 F (36.7 C) (Oral)   Ht _0  (1.702 m)   Wt 267 lb (121.1 kg)   SpO2 95%   BMI 41.82 kg/m   GEN: A/Ox3; pleasant , NAD, well nourished    HEENT:  [place]/AT,  EACs-clear, TMs-wnl, NOSE-clear, THROAT-clear, no lesions, no postnasal drip or exudate noted.   NECK:  Supple w/ fair ROM; no JVD; normal carotid impulses w/o bruits; no thyromegaly or nodules palpated; no lymphadenopathy.    RESP  Clear  P & A; w/o, wheezes/ rales/ or rhonchi. no accessory muscle use, no dullness to percussion  CARD: Irregular no m/r/g, no peripheral edema, pulses intact, no cyanosis or clubbing.  GI:   Soft & nt; nml bowel sounds; no organomegaly or masses detected.   Musco: Warm bil, no deformities or joint swelling noted.   Neuro: alert, no focal deficits noted.    Skin: Warm, no lesions  or rashes  Psych:  No change in mood or affect. No depression or anxiety.  No memory loss.  Lab Results:  CBC    Component Value Date/Time   WBC 11.4 (H) 12/10/2016 1724   RBC 5.43 (H) 12/10/2016 1724   HGB 15.8 (H) 12/10/2016 1724   HCT 45.8 12/10/2016 1724   PLT 273.0 12/10/2016 1724  MCV 84.4 12/10/2016 1724   MCH 29.0 09/28/2014 1557   MCHC 34.4 12/10/2016 1724   RDW 13.3 12/10/2016 1724   LYMPHSABS 2.7 12/10/2016 1724   MONOABS 1.6 (H) 12/10/2016 1724   EOSABS 0.5 12/10/2016 1724   BASOSABS 0.0 12/10/2016 1724    BMET    Component Value Date/Time   NA 135 12/10/2016 1724   NA 138 10/25/2014   K 4.9 12/10/2016 1724   CL 101 12/10/2016 1724   CO2 27 12/10/2016 1724   GLUCOSE 100 (H) 12/10/2016 1724   BUN 22 12/10/2016 1724   BUN 17 10/25/2014   CREATININE 0.93 12/10/2016 1724   CREATININE 0.86 09/28/2014 1557   CALCIUM 9.3 12/10/2016 1724   GFRNONAA 56 (L) 06/06/2014 1653   GFRAA 65 06/06/2014 1653    BNP No results found for: BNP  ProBNP    Component Value Date/Time   PROBNP 99.0 12/10/2016 1724    Imaging: Dg Chest 2 View  Result Date: 12/10/2016 CLINICAL DATA:  Three days of dyspnea. History atrial fibrillation, mitral valve prolapse, and pacemaker placement. Former smoker. EXAM: CHEST  2 VIEW COMPARISON:  PA and lateral chest x-ray of September 02, 2016 FINDINGS: The lungs are adequately inflated. The interstitial markings are coarse though stable. There is no alveolar infiltrate or pleural effusion. The heart and pulmonary vascularity are normal. There is calcification in the wall of the thoracic aorta. The ICD is in stable position. The bony thorax exhibits no acute abnormality. IMPRESSION: Chronic bronchitic changes. No pneumonia, CHF, nor other acute cardiopulmonary abnormality. Electronically Signed   By: David  Martinique M.D.   On: 12/10/2016 17:25    EKG >show A fib w/ rate 90   Assessment & Plan:   Dyspnea on exertion Dypsnea progressive DOE over  last several months ? Etiology  Spirometry shows normal lung function .  She has had a recent URI/Bronchitis s/p ABX and steorids - check  cxr today to r/o acute illness.  Check labs with CBC, bmet and BNP  EKG shows A Fib w/ controlled rate. -get in with cardiology to discuss/ rule out card source Have her return for for full PFT - ? Asthma component   Plan  Patient Instructions  Chest xray today .  Labs today .  Refer to cardiology .  Mucinex DM Twice daily  As needed  Cough/congestion .  Begin Prilosec 50m daily before meal .  GERD diet .  Stop oil supplement .  Follow up with Dr. YAnnamaria Boots In 2  months with PFT and As needed           TRexene Edison NP 12/12/2016

## 2016-12-10 NOTE — Telephone Encounter (Signed)
Remote transmission received. Presenting rhythm: ApVs. Ap: 93.3%, Vp: <1%. (1) VT-NS episode from 12/26 x 15bts. (Last echo 08/13/16--EF 50-55%).  Patient called in with c/o ShOB last night. She said that the ShOB lasted roughly one hour and then spontaneously went away. Patient denied any CP or other sx's. Patient stated that she had been taking an antibiotic and prednisone x 5days (Rx'd by pulmonologist) for a cold. I advised patient to contact her pulmonologist about her sx's since completing the medications.  Patient voiced understanding of information given.

## 2016-12-10 NOTE — Patient Instructions (Addendum)
Chest xray today .  Labs today .  Refer to cardiology .  Mucinex DM Twice daily  As needed  Cough/congestion .  Begin Prilosec 20m daily before meal .  GERD diet .  Stop oil supplement .  Follow up with Dr. YAnnamaria Boots In 2  months with PFT and As needed

## 2016-12-10 NOTE — Telephone Encounter (Signed)
Pt called and stated that last night she had a hard time catching her breath. No chest tightness, no chest pain, no dizziness or fatigue. Instructed pt to send a remote transmission.

## 2016-12-11 ENCOUNTER — Telehealth: Payer: Self-pay | Admitting: Adult Health

## 2016-12-11 DIAGNOSIS — R0600 Dyspnea, unspecified: Secondary | ICD-10-CM

## 2016-12-11 LAB — BRAIN NATRIURETIC PEPTIDE: Pro B Natriuretic peptide (BNP): 99 pg/mL (ref 0.0–100.0)

## 2016-12-11 LAB — BASIC METABOLIC PANEL
BUN: 22 mg/dL (ref 6–23)
CALCIUM: 9.3 mg/dL (ref 8.4–10.5)
CO2: 27 meq/L (ref 19–32)
CREATININE: 0.93 mg/dL (ref 0.40–1.20)
Chloride: 101 mEq/L (ref 96–112)
GFR: 62.28 mL/min (ref >60.00)
Glucose, Bld: 100 mg/dL — ABNORMAL HIGH (ref 70–99)
Potassium: 4.9 mEq/L (ref 3.5–5.1)
Sodium: 135 mEq/L (ref 135–145)

## 2016-12-11 NOTE — Telephone Encounter (Signed)
Pt returned call to Rexene Edison, NP  Pt states that if she cannot be reached at home to call her cell # [phone]

## 2016-12-11 NOTE — Telephone Encounter (Signed)
Tried to call , no answer Please notify of labs/xray results

## 2016-12-11 NOTE — Telephone Encounter (Signed)
Spoke with [name] [name] who confirmed that pt is already aware of results, only needs a hrct scheduled at this point.  hrct ordered.  Nothing further needed.

## 2016-12-11 NOTE — Telephone Encounter (Signed)
Result Notes   Notes Recorded by Jannette Spanner, CMA on 12/11/2016 at 12:18 PM EST Pt advised of lab results and verbalized understanding. She would like to schedule CT.   ------  Notes Recorded by Melvenia Needles, NP on 12/11/2016 at 10:20 AM EST Bronchitic changes in lungs . With her dyspnea would like to set up for HRCT Chest to further look at this to make sure not scarring /fibrosis  Please contact office for sooner follow up if symptoms do not improve or worsen or seek emergency care  Make sure she keeps follow up with cards , Dr. Annamaria Boots With PFT   Notes Recorded by Melvenia Needles, NP on 12/11/2016 at 12:45 PM EST Labs look ok  Cont w/ ov recs  Please contact office for sooner follow up if symptoms do not improve or worsen or seek emergency care    Please order High Res CT Chest once you have spoken with patient.

## 2016-12-12 ENCOUNTER — Ambulatory Visit (INDEPENDENT_AMBULATORY_CARE_PROVIDER_SITE_OTHER): Payer: Medicare Other | Admitting: Cardiology

## 2016-12-12 ENCOUNTER — Encounter: Payer: Self-pay | Admitting: Cardiology

## 2016-12-12 VITALS — BP 122/62 | HR 92 | Ht 67.0 in | Wt 268.0 lb

## 2016-12-12 DIAGNOSIS — R079 Chest pain, unspecified: Secondary | ICD-10-CM | POA: Diagnosis not present

## 2016-12-12 DIAGNOSIS — G4733 Obstructive sleep apnea (adult) (pediatric): Secondary | ICD-10-CM

## 2016-12-12 DIAGNOSIS — Z950 Presence of cardiac pacemaker: Secondary | ICD-10-CM | POA: Diagnosis not present

## 2016-12-12 DIAGNOSIS — Z7901 Long term (current) use of anticoagulants: Secondary | ICD-10-CM

## 2016-12-12 DIAGNOSIS — G458 Other transient cerebral ischemic attacks and related syndromes: Secondary | ICD-10-CM

## 2016-12-12 DIAGNOSIS — I480 Paroxysmal atrial fibrillation: Secondary | ICD-10-CM

## 2016-12-12 DIAGNOSIS — R0609 Other forms of dyspnea: Secondary | ICD-10-CM | POA: Diagnosis not present

## 2016-12-12 DIAGNOSIS — Z9989 Dependence on other enabling machines and devices: Secondary | ICD-10-CM

## 2016-12-12 DIAGNOSIS — R0600 Dyspnea, unspecified: Secondary | ICD-10-CM

## 2016-12-12 DIAGNOSIS — K219 Gastro-esophageal reflux disease without esophagitis: Secondary | ICD-10-CM | POA: Insufficient documentation

## 2016-12-12 MED ORDER — NITROGLYCERIN 0.4 MG SL SUBL: 0.4000 mg | SUBLINGUAL_TABLET | SUBLINGUAL | 5 refills | Status: DC | PRN

## 2016-12-12 NOTE — Assessment & Plan Note (Signed)
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

## 2016-12-12 NOTE — Assessment & Plan Note (Signed)
BMI 41 

## 2016-12-12 NOTE — Patient Instructions (Addendum)
Medication Instructions:  Use your NTG under your tongue for recurrent chest pain. May take one tablet every 5 minutes. If you are still having discomfort after 3 tablets in 15 minutes, call 911.  Labwork: NONE  Testing/Procedures: Your physician has requested that you have a lexiscan myoview. For further information please visit [email or web address]. Please follow instruction sheet, as given.. 2 DAY STUDY  Follow-Up: Your physician recommends that you schedule a follow-up appointment in: 3-4 [place]  If you need a refill on your cardiac medications before your next appointment, please call your pharmacy.

## 2016-12-12 NOTE — Assessment & Plan Note (Signed)
GERD sx  Would stop oil supplement  Begin PPI /GERD diet   Plan  Patient Instructions  Chest xray today .  Labs today .  Refer to cardiology .  Mucinex DM Twice daily  As needed  Cough/congestion .  Begin Prilosec 66m daily before meal .  GERD diet .  Stop oil supplement .  Follow up with Dr. YAnnamaria Boots In 2  months with PFT and As needed

## 2016-12-12 NOTE — Assessment & Plan Note (Signed)
In AF with CVR today

## 2016-12-12 NOTE — Progress Notes (Signed)
12/12/2016 [name] [name]   01-15-1941  [id number]  Primary Physician Annye Asa, MD Primary Cardiologist: Dr Sallyanne Kuster  HPI:  76 y.o. morbidly obese female(BMI 41) with complaints of dyspnea and cough. She presents to the office accompanied by her neighbor who is an Therapist, sports. The pt is followed by Dr Canary Brim for PAF and a pacemaker. She has no history of CAD. MI, or DM. She has treated OSA and is compliant with C-pap. Myoview in 2009 was low risk and an echo Sept 2017 showed an EF of 50-55% with grade 1 DD. She recently saw Rexene Edison NP at the pulmonary office. The relayed a history to her that was concerning for a possible cardiac etiology for her symptoms. The's spirometry was unrevealing. She had minor changes on her CXR and is to have a HRCT scan.   The pt says she has had epsisodes at rest of high chest and throat "burning" and SOB. When she went to see Tammy she noted early DOE and tachycardia. The pt does have a FMHx of CAD- her father had an MI in his 72's.   Current Outpatient Prescriptions  Medication Sig Dispense Refill  . apixaban (ELIQUIS) 5 MG TABS tablet Take 1 tablet (5 mg total) by mouth 2 (two) times daily. 28 tablet 0  . Biotin 5000 MCG CAPS Take 5,000 mcg by mouth daily.     . Calcium Carbonate-Vit D-Min (CALCIUM 1200 PO) Take 2,400 mg by mouth daily.     . cholecalciferol (VITAMIN D) 1000 UNITS tablet Take 1,000 Units by mouth daily.     Marland Kitchen Co-Enzyme Q10 100 MG CAPS Take 100 mg by mouth daily.     . diazepam (VALIUM) 5 MG tablet Take 5 mg by mouth as needed for anxiety.     . folic acid (FOLVITE) 767 MCG tablet Take 400 mcg by mouth daily.    Marland Kitchen glucosamine-chondroitin 500-400 MG tablet Take 1 tablet by mouth daily.     Marland Kitchen loratadine (KLS ALLERCLEAR) 10 MG tablet Take 10 mg by mouth daily.    Marland Kitchen MAGNESIUM CITRATE PO Take 250 mg by mouth daily. 1 tab daily    . metoprolol tartrate (LOPRESSOR) 25 MG tablet Take 1 tablet (25 mg total) by mouth 2 (two) times daily. 180  tablet 2  . Resveratrol 250 MG CAPS Take 250 mg by mouth daily.    . sertraline (ZOLOFT) 100 MG tablet Take 1 tablet (100 mg total) by mouth daily. 90 tablet 1  . Simethicone (GAS-X PO) Take 2 tablets by mouth daily as needed (bloating).    . nitroGLYCERIN (NITROSTAT) 0.4 MG SL tablet Place 1 tablet (0.4 mg total) under the tongue every 5 (five) minutes as needed for chest pain. 25 tablet 5   No current facility-administered medications for this visit.     Allergies  Allergen Reactions  . Ancef [Cefazolin] Hives and Itching  . Caffeine Palpitations    Social History   Social History  . Marital status: Married    Spouse name: N/A  . Number of children: 3  . Years of education: college   Occupational History  . Not on file.   Social History Main Topics  . Smoking status: Former Smoker    Packs/day: 2.00    Years: 18.00    Types: Cigarettes  . Smokeless tobacco: Never Used     Comment: "quit smoking in 1971"  . Alcohol use 0.0 oz/week     Comment: 10/04/2014 "might have a drink  a couple times/yr"  . Drug use: No  . Sexual activity: No   Other Topics Concern  . Not on file   Social History Narrative   Patient lives alone in one story house.   Patient is right handed.   Patient has a college degree.   Patient is retired.     Review of Systems: General: negative for chills, fever, night sweats or weight changes.  Cardiovascular: negative for chest pain, dyspnea on exertion, edema, orthopnea, palpitations, paroxysmal nocturnal dyspnea or shortness of breath Dermatological: negative for rash Respiratory: negative for cough or wheezing Urologic: negative for hematuria Abdominal: negative for nausea, vomiting, diarrhea, bright red blood per rectum, melena, or hematemesis Neurologic: negative for visual changes, syncope, or dizziness All other systems reviewed and are otherwise negative except as noted above.    Blood pressure 122/62, pulse 92, height 5' 7" (1.702 m),  weight 268 lb (121.6 kg).  General appearance: alert, cooperative, no distress and morbidly obese Neck: no carotid bruit and no JVD Lungs: clear to auscultation bilaterally Heart: regular rate and rhythm Extremities: extremities normal, atraumatic, no cyanosis or edema Pulses: 2+ and symmetric Skin: Skin color, texture, turgor normal. 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 I did give an Rx for SL NTG prn. F/U with Dr Sallyanne Kuster after Myoview.   Kerin Ransom PA-C 12/12/2016 11:52 AM

## 2016-12-12 NOTE — Assessment & Plan Note (Signed)
CHADs VASc=5

## 2016-12-12 NOTE — Assessment & Plan Note (Signed)
2011 MRI negative for CVA

## 2016-12-12 NOTE — Assessment & Plan Note (Addendum)
Pt complains of upper chest and throat discomfort concerning for angina

## 2016-12-12 NOTE — Assessment & Plan Note (Signed)
Reports compliance with C-pap

## 2016-12-12 NOTE — Assessment & Plan Note (Signed)
Pt referred for history of DOE and chest, throat discomfort

## 2016-12-13 ENCOUNTER — Telehealth (HOSPITAL_COMMUNITY): Payer: Self-pay

## 2016-12-13 NOTE — Telephone Encounter (Signed)
Encounter complete. 

## 2016-12-16 ENCOUNTER — Telehealth: Payer: Self-pay | Admitting: Cardiology

## 2016-12-16 NOTE — Telephone Encounter (Signed)
New message   Pt verbalized that she is calling to speak to [facility]  Pt wants to speak about the Echo that he stated she had 3 months ago

## 2016-12-16 NOTE — Telephone Encounter (Signed)
Spoke with pt states that she does remember having Echo done in September. Just wanted to call and let him/Nathan know that she did remember having this study done.

## 2016-12-18 ENCOUNTER — Inpatient Hospital Stay (HOSPITAL_COMMUNITY): Admission: RE | Admit: 2016-12-18 | Payer: Medicare Other | Source: Ambulatory Visit

## 2016-12-19 ENCOUNTER — Ambulatory Visit (HOSPITAL_COMMUNITY): Payer: Medicare Other

## 2016-12-24 ENCOUNTER — Telehealth (HOSPITAL_COMMUNITY): Payer: Self-pay

## 2016-12-24 NOTE — Telephone Encounter (Signed)
Encounter complete. 

## 2016-12-26 ENCOUNTER — Ambulatory Visit (HOSPITAL_COMMUNITY)
Admission: RE | Admit: 2016-12-26 | Discharge: 2016-12-26 | Disposition: A | Discharge status: Home / Self Care | Payer: Medicare Other | Source: Ambulatory Visit | Attending: Cardiovascular Disease | Admitting: Cardiovascular Disease

## 2016-12-26 DIAGNOSIS — R079 Chest pain, unspecified: Secondary | ICD-10-CM | POA: Diagnosis not present

## 2016-12-26 DIAGNOSIS — R0609 Other forms of dyspnea: Secondary | ICD-10-CM | POA: Diagnosis not present

## 2016-12-26 MED ORDER — REGADENOSON 0.4 MG/5ML IV SOLN
0.4000 mg | Freq: Once | INTRAVENOUS | Status: AC
  Administered 2016-12-26: 0.4 mg via INTRAVENOUS

## 2016-12-26 MED ORDER — TECHNETIUM TC 99M TETROFOSMIN IV KIT
31.6000 | PACK | Freq: Once | INTRAVENOUS | Status: AC | PRN
  Administered 2016-12-26: 31.6 via INTRAVENOUS
  Filled 2016-12-26: qty 32

## 2016-12-26 MED ORDER — AMINOPHYLLINE 25 MG/ML IV SOLN
75.0000 mg | Freq: Once | INTRAVENOUS | Status: AC
  Administered 2016-12-26: 75 mg via INTRAVENOUS

## 2016-12-27 ENCOUNTER — Ambulatory Visit (HOSPITAL_COMMUNITY)
Admission: RE | Admit: 2016-12-27 | Discharge: 2016-12-27 | Disposition: A | Discharge status: Home / Self Care | Payer: Medicare Other | Source: Ambulatory Visit | Attending: Cardiology | Admitting: Cardiology

## 2016-12-27 ENCOUNTER — Telehealth: Payer: Self-pay

## 2016-12-27 LAB — MYOCARDIAL PERFUSION IMAGING
Peak HR: 71 {beats}/min
Rest HR: 63 {beats}/min
SDS: 0
SRS: 0
SSS: 0
TID: 0.81

## 2016-12-27 MED ORDER — TECHNETIUM TC 99M TETROFOSMIN IV KIT
29.3000 | PACK | Freq: Once | INTRAVENOUS | Status: AC | PRN
  Administered 2016-12-27: 29.3 via INTRAVENOUS

## 2016-12-27 NOTE — Telephone Encounter (Signed)
LM requesting call back regarding AWV. Requesting patient have AWV completed by [facility] on 01/15/17 at 12:30 or 2pm to be followed by OV with PCP.

## 2016-12-31 ENCOUNTER — Telehealth: Payer: Self-pay | Admitting: Adult Health

## 2016-12-31 ENCOUNTER — Telehealth: Payer: Self-pay | Admitting: Cardiovascular Disease

## 2016-12-31 NOTE — Telephone Encounter (Signed)
[name]. She notes she'd really wanted to speak to [facility].   Expressed understanding regarding her nml stress test findings, but voiced frustration bc she still has unclear explanation for her DOE. Notes she already had pulmology r/o lung concerns. Reminded her that she has upcoming appt w Dr. Sallyanne Kuster on 2/5 and she notes she would like to discuss this further then. Confirmed appt details. She's aware to call if new concerns.  FYI

## 2016-12-31 NOTE — Telephone Encounter (Signed)
New Message   Pt requesting call back regarding stress test. Per pt would please like call back from nurse or PA.

## 2016-12-31 NOTE — Telephone Encounter (Signed)
lmomtcb x1 

## 2017-01-01 NOTE — Telephone Encounter (Signed)
PCCs  Spoke with pt. And she stated she is ready to schedule her ct scan

## 2017-01-01 NOTE — Telephone Encounter (Signed)
[phone] pt calling back

## 2017-01-02 NOTE — Telephone Encounter (Signed)
Ct is scheduled 01/06/17_0  pt is aware [name] [place]

## 2017-01-06 ENCOUNTER — Ambulatory Visit (INDEPENDENT_AMBULATORY_CARE_PROVIDER_SITE_OTHER): Payer: Medicare Other | Admitting: Cardiovascular Disease

## 2017-01-06 ENCOUNTER — Encounter: Payer: Self-pay | Admitting: Cardiovascular Disease

## 2017-01-06 ENCOUNTER — Ambulatory Visit (INDEPENDENT_AMBULATORY_CARE_PROVIDER_SITE_OTHER)
Admission: RE | Admit: 2017-01-06 | Discharge: 2017-01-06 | Disposition: A | Discharge status: Home / Self Care | Payer: Medicare Other | Source: Ambulatory Visit | Attending: Adult Health | Admitting: Adult Health

## 2017-01-06 VITALS — BP 119/67 | HR 86 | Ht 67.0 in | Wt 268.0 lb

## 2017-01-06 DIAGNOSIS — I495 Sick sinus syndrome: Secondary | ICD-10-CM | POA: Diagnosis not present

## 2017-01-06 DIAGNOSIS — Z950 Presence of cardiac pacemaker: Secondary | ICD-10-CM

## 2017-01-06 DIAGNOSIS — R0600 Dyspnea, unspecified: Secondary | ICD-10-CM | POA: Diagnosis not present

## 2017-01-06 DIAGNOSIS — R0609 Other forms of dyspnea: Secondary | ICD-10-CM | POA: Diagnosis not present

## 2017-01-06 DIAGNOSIS — J8410 Pulmonary fibrosis, unspecified: Secondary | ICD-10-CM | POA: Diagnosis not present

## 2017-01-06 DIAGNOSIS — Z7901 Long term (current) use of anticoagulants: Secondary | ICD-10-CM

## 2017-01-06 DIAGNOSIS — I480 Paroxysmal atrial fibrillation: Secondary | ICD-10-CM

## 2017-01-06 NOTE — Progress Notes (Signed)
Cardiology Office Note    Date:  01/06/2017   ID:  [name] [name], DOB 1941-08-25, MRN [id number]  PCP:  Annye Asa, MD  Cardiologist:   Sanda Klein, MD   Chief Complaint  Patient presents with  . Follow-up    pt c/o DOE     History of Present Illness:  [name] [name] is a 76 y.o. female with complaints of worsening dyspnea and cough.   She reports that she has to stop 3 times before she can finish making her bed. Getting dressed sometimes it makes her short of breath. She was in the pulmonology office recently and was doing a walking oximetry test. The nurse told her that her heart "went crazy" and that she should see her heart doctor urgently. It sounds like her oxygen levels did not drop, but the rhythm was irregular. He is scheduled for a high-resolution chest CT later today. She does not recall ever having pulmonary function tests, but I found a report of PFTs performed on January 11. FEV1 94%, FVC 84%. She has a follow-up visit with Dr. Annamaria Boots in March with PFTs again.  About 2 weeks ago she had a normal Lexiscan Myoview study. Chest x-ray last month showed chronic bronchitic changes, no acute cardiopulmonary disease.  Termination of her pacemaker today shows frequent episodes of very brief paroxysmal atrial fibrillation, usually lasting for a few minutes. Some of the very brief episodes of atrial fibrillation are associated with more rapid ventricular rates in the 140s or 150s Occasionally the episodes are a little longer lasting up to about 6 hours (for example in January 2017, April 2017 and  December 10, 2016. During these longer episodes ventricular rate control is generally very good, ventricular rates in the 70s and 80s. Overall burden of atrial fibrillation is very low at 0.2%.  Otherwise pacemaker function is normal. She has 92% atrial pacing and virtually no ventricular pacing. Heart rate histogram distribution is quite favorable suggesting that the current rate response  sensor settings are appropriate. Activity is relatively constant at 4 hours a day. Estimated battery longevity is about 8.5 years.  Her echocardiogram in April 2015 showed evidence of diastolic dysfunction without elevated filling pressures. She has not had clinically evident congestive heart failure in the past. She does not have systemic hypertension or coronary artery disease. She has a history of paroxysmal atrial fibrillation and transient ischemic attack as well as tachycardia-bradycardia syndrome with sinus node arrest for which she received a dual-chamber permanent pacemaker roughly a year ago.  She is compliant with anticoagulation and has not had any bleeding problems or new neurological complaints. She denies angina pectoris at rest or with exertion, syncope, palpitations, leg edema, orthopnea or PND.  Her daughter has alpha-1 antitrypsin deficiency and advanced emphysema.   Past Medical History:  Diagnosis Date  . Alpha-1-antitrypsin deficiency carrier ([place])   . Arthritis    "all over"  . Atrial fibrillation ([place])   . Complication of anesthesia    "I woke up during 2 different procedures" (10/04/2014)  . Frequent UTI   . GERD (gastroesophageal reflux disease)   . Hypertension   . MVP (mitral valve prolapse)   . Obesity   . Presence of permanent cardiac pacemaker   . PVC's (premature ventricular contractions)   . 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 Stroke [facility]) 01/2014   denies deficits on 10/04/2014  . Tachy-brady syndrome ([place]) 10/2014    Past  Surgical History:  Procedure Laterality Date  . EXCISION VAGINAL CYST     benign nodules  . INSERT / REPLACE / REMOVE PACEMAKER  10/04/2014   Medtronic Advisa model [id number] serial number [id number]  . LOOP RECORDER EXPLANT N/A 10/04/2014   Procedure: LOOP RECORDER EXPLANT;  Surgeon: Sanda Klein, MD;  Location: [facility] CATH LAB;  Service: Cardiovascular;  Laterality: N/A;  . LOOP  RECORDER IMPLANT N/A 04/19/2014   Procedure: LOOP RECORDER IMPLANT;  Surgeon: Sanda Klein, MD;  Location: [place] CATH LAB;  Service: Cardiovascular;  Laterality: N/A;  . NM MYOCAR PERF WALL MOTION  12/18/2007   normal  . PERMANENT PACEMAKER INSERTION N/A 10/04/2014   Procedure: PERMANENT PACEMAKER INSERTION;  Surgeon: Sanda Klein, MD;  Location: [place] [facility] CATH LAB;  Service: Cardiovascular;  Laterality: N/A;  . TUBAL LIGATION    . US ECHOCARDIOGRAPHY  05/15/2010   LA mildly dilated,mild mitral annular ca+, AOV mildly sclerotic, mild asymmetric LVH    Current Medications: Outpatient Medications Prior to Visit  Medication Sig Dispense Refill  . apixaban (ELIQUIS) 5 MG TABS tablet Take 1 tablet (5 mg total) by mouth 2 (two) times daily. 28 tablet 0  . Biotin 5000 MCG CAPS Take 5,000 mcg by mouth daily.     . Calcium Carbonate-Vit D-Min (CALCIUM 1200 PO) Take 2,400 mg by mouth daily.     . cholecalciferol (VITAMIN D) 1000 UNITS tablet Take 1,000 Units by mouth daily.     Marland Kitchen Co-Enzyme Q10 100 MG CAPS Take 100 mg by mouth daily.     . diazepam (VALIUM) 5 MG tablet Take 5 mg by mouth as needed for anxiety.     . folic acid (FOLVITE) 536 MCG tablet Take 400 mcg by mouth daily.    Marland Kitchen glucosamine-chondroitin 500-400 MG tablet Take 1 tablet by mouth daily.     Marland Kitchen loratadine (KLS ALLERCLEAR) 10 MG tablet Take 10 mg by mouth daily.    Marland Kitchen MAGNESIUM CITRATE PO Take 250 mg by mouth daily. 1 tab daily    . metoprolol tartrate (LOPRESSOR) 25 MG tablet Take 1 tablet (25 mg total) by mouth 2 (two) times daily. 180 tablet 2  . nitroGLYCERIN (NITROSTAT) 0.4 MG SL tablet Place 1 tablet (0.4 mg total) under the tongue every 5 (five) minutes as needed for chest pain. 25 tablet 5  . Resveratrol 250 MG CAPS Take 250 mg by mouth daily.    . sertraline (ZOLOFT) 100 MG tablet Take 1 tablet (100 mg total) by mouth daily. 90 tablet 1  . Simethicone (GAS-X PO) Take 2 tablets by mouth daily as needed (bloating).     No  facility-administered medications prior to visit.      Allergies:   Ancef [cefazolin] and Caffeine   Social History   Social History  . Marital status: Married    Spouse name: N/A  . Number of children: 3  . Years of education: college   Social History Main Topics  . Smoking status: Former Smoker    Packs/day: 2.00    Years: 18.00    Types: Cigarettes  . Smokeless tobacco: Never Used     Comment: "quit smoking in 1971"  . Alcohol use 0.0 oz/week     Comment: 10/04/2014 "might have a drink a couple times/yr"  . Drug use: No  . Sexual activity: No   Other Topics Concern  . None   Social History Narrative   Patient lives alone in one story house.   Patient is right handed.  Patient has a college degree.   Patient is retired.     Family History:  The patient's family history includes Heart attack in her mother; Heart failure in her brother and father; Stroke in her brother and father.   ROS:   Please see the history of present illness.    ROS All other systems reviewed and are negative.   PHYSICAL EXAM:   VS:  BP 119/67 (BP Location: Left Arm, Patient Position: Sitting, Cuff Size: Large)   Pulse 86   Ht _0  (1.702 m)   Wt 121.6 kg (268 lb)   BMI 41.97 kg/m    GEN: Well nourished, well developed, in no acute distress  HEENT: normal  Neck: no JVD, carotid bruits, or masses Cardiac: RRR; no murmurs, rubs, or gallops,no edema ,  LC left subclavian pacemaker site Respiratory:  clear to auscultation bilaterally, normal work of breathing GI: soft, nontender, nondistended, + BS MS: no deformity or atrophy  Skin: warm and dry, no rash Neuro:  Alert and Oriented x 3, Strength and sensation are intact Psych: euthymic mood, full affect  Wt Readings from Last 3 Encounters:  01/06/17 121.6 kg (268 lb)  12/26/16 121.6 kg (268 lb)  12/12/16 121.6 kg (268 lb)      Studies/Labs Reviewed:   EKG:  EKG is ordered today.  The ekg ordered today demonstrates Atrial paced  ventricular sensed rhythm with long AV delay of 264 ms, QTC 427 ms there is otherwise normal  Recent Labs: 07/15/2016: ALT 17; TSH 2.70 12/10/2016: BUN 22; Creatinine, Ser 0.93; Hemoglobin 15.8; Platelets 273.0; Potassium 4.9; Pro B Natriuretic peptide (BNP) 99.0; Sodium 135   Lipid Panel    Component Value Date/Time   CHOL 163 07/15/2016 1419   TRIG 208.0 (H) 07/15/2016 1419   HDL 45.70 07/15/2016 1419   CHOLHDL 4 07/15/2016 1419   VLDL 41.6 (H) 07/15/2016 1419   LDLCALC 138 10/25/2014   LDLDIRECT 81.0 07/15/2016 1419    ASSESSMENT:    1. PAF (paroxysmal atrial fibrillation) ([place])   2. SSS (sick sinus syndrome) ([place])   3. Pacemaker   4. Exertional dyspnea   5. Long term current use of anticoagulant   6. Severe obesity (BMI >= 40) ([facility])      PLAN:  In order of problems listed above:  1. AFib: Arrhythmia burden is very low, so's are asymptomatic and are not temporally associated with her complaints of dyspnea. Continue anticoagulation. CHADSVasc 5 (Age 51, gender, TIA). Rate control during the longer episodes of atrial fibrillation is excellent. I don't think she requires additional treatment for the very brief episodes of paroxysmal atrial tachycardia with rapid rates. I do not think atrial fibrillation can explain her dyspnea. 2. SSS: He heart rate histogram distribution on her pacemaker suggest that we are adequately compensating for chronotropic incompetence with the current device rate response settings.. 3. PPM: Normal device function, remote downloads every 3 months and yearly office visits 4. Dyspnea: Her echo does show diastolic function, at least at rest her filling pressures are normal. Is possible that filling pressures increase with activity. Despite obesity and previous smoking and family history of alpha-1 antitrypsin deficiency, her PFTs were not that bad. May need to consider a cardiopulmonary stress test or right and left heart catheterization to clarify the mechanism  of her dyspnea.. Alternatively, obesity and deconditioning may be responsible for her dyspnea 5. Anticoagulation: tolerating Eliquis well without bleeding complications. 6. Obesity: Strongly recommend weight loss which will help with her  dyspnea and reduce the burden of arrhythmia.    Medication Adjustments/Labs and Tests Ordered: Current medicines are reviewed at length with the patient today.  Concerns regarding medicines are outlined above.  Medication changes, Labs and Tests ordered today are listed in the Patient Instructions below. Patient Instructions  Dr Sallyanne Kuster recommends that you continue on your current medications as directed. Please refer to the Current Medication list given to you today.  Remote monitoring is used to monitor your Pacemaker of ICD from home. This monitoring reduces the number of office visits required to check your device to one time per year. It allows [place] to keep an eye on the functioning of your device to ensure it is working properly. You are scheduled for a device check from home on Monday, May 7th, 2018. You may send your transmission at any time that day. If you have a wireless device, the transmission will be sent automatically. After your physician reviews your transmission, you will receive a postcard with your next transmission date.  Dr Sallyanne Kuster recommends that you schedule a follow-up appointment in 6 months with a pacemaker check. You will receive a reminder letter in the mail two months in advance. If you don't receive a letter, please call our office to schedule the follow-up appointment.  If you need a refill on your cardiac medications before your next appointment, please call your pharmacy.    Signed, Sanda Klein, MD  01/06/2017 2:26 PM    [address], [place], [place]  [address] Phone: [phone]; Fax: [phone]

## 2017-01-06 NOTE — Patient Instructions (Signed)
Dr Sallyanne Kuster recommends that you continue on your current medications as directed. Please refer to the Current Medication list given to you today.  Remote monitoring is used to monitor your Pacemaker of ICD from home. This monitoring reduces the number of office visits required to check your device to one time per year. It allows [place] to keep an eye on the functioning of your device to ensure it is working properly. You are scheduled for a device check from home on Monday, May 7th, 2018. You may send your transmission at any time that day. If you have a wireless device, the transmission will be sent automatically. After your physician reviews your transmission, you will receive a postcard with your next transmission date.  Dr Sallyanne Kuster recommends that you schedule a follow-up appointment in 6 months with a pacemaker check. You will receive a reminder letter in the mail two months in advance. If you don't receive a letter, please call our office to schedule the follow-up appointment.  If you need a refill on your cardiac medications before your next appointment, please call your pharmacy.

## 2017-01-07 ENCOUNTER — Encounter: Payer: Self-pay | Admitting: Adult Health

## 2017-01-07 DIAGNOSIS — R918 Other nonspecific abnormal finding of lung field: Secondary | ICD-10-CM | POA: Insufficient documentation

## 2017-01-07 NOTE — Progress Notes (Signed)
Thanks, Clint. I'll wait for the pulmonary results before a CPX. The weight gain is definitely part of the problem. Would it be best for me to order the PFTs or should it be done through your office? Darrio Bade

## 2017-01-09 ENCOUNTER — Telehealth: Payer: Self-pay | Admitting: Internal Medicine

## 2017-01-09 DIAGNOSIS — R0602 Shortness of breath: Secondary | ICD-10-CM

## 2017-01-09 NOTE — Telephone Encounter (Signed)
Notes Recorded by Melvenia Needles, NP on 01/07/2017 at 9:19 AM EST CT chest does show some scarring in bases of lungs and scattered very small lung nodules  This could be contributing to some of her dyspnea.  Would like her to have full PFT and ov to discuss in more detail with Dr. Annamaria Boots In 6 weeks .  Will need CT chest w/o contrast in 3 months to follow lung nodules.  ----------------------------------------------------- Spoke with pt. She is aware of CT results. PFT and OV have been scheduled. Nothing further was needed.

## 2017-01-10 ENCOUNTER — Telehealth: Payer: Self-pay | Admitting: Internal Medicine

## 2017-01-10 LAB — CUP PACEART INCLINIC DEVICE CHECK
Battery Remaining Longevity: 102 mo
Battery Voltage: 3.02 V
Brady Statistic AP VP Percent: 0.07 %
Brady Statistic AS VS Percent: 7.63 %
Brady Statistic RA Percent Paced: 91.52 %
Implantable Lead Implant Date: 20151103
Implantable Lead Implant Date: 20151103
Implantable Lead Location: 753860
Implantable Pulse Generator Implant Date: 20151103
Lead Channel Impedance Value: 418 Ohm
Lead Channel Impedance Value: 532 Ohm
Lead Channel Impedance Value: 551 Ohm
Lead Channel Pacing Threshold Amplitude: 0.75 V
Lead Channel Pacing Threshold Pulse Width: 0.4 ms
Lead Channel Pacing Threshold Pulse Width: 0.4 ms
Lead Channel Sensing Intrinsic Amplitude: 5.125 mV
Lead Channel Sensing Intrinsic Amplitude: 5.125 mV
Lead Channel Setting Pacing Amplitude: 1.5 V
Lead Channel Setting Sensing Sensitivity: 2.8 mV
MDC IDC LEAD LOCATION: 753859
MDC IDC MSMT LEADCHNL RA IMPEDANCE VALUE: 513 Ohm
MDC IDC MSMT LEADCHNL RV PACING THRESHOLD AMPLITUDE: 0.5 V
MDC IDC MSMT LEADCHNL RV SENSING INTR AMPL: 7.375 mV
MDC IDC MSMT LEADCHNL RV SENSING INTR AMPL: 7.375 mV
MDC IDC SESS DTM: 20180205163939
MDC IDC SET LEADCHNL RV PACING AMPLITUDE: 2 V
MDC IDC SET LEADCHNL RV PACING PULSEWIDTH: 0.4 ms
MDC IDC STAT BRADY AP VS PERCENT: 92.24 %
MDC IDC STAT BRADY AS VP PERCENT: 0.06 %
MDC IDC STAT BRADY RV PERCENT PACED: 0.14 %

## 2017-01-10 NOTE — Telephone Encounter (Signed)
Spoke with pt. We spoke yesterday about her CT results. TP wanted her to be scheduled for a PFT and OV with CY in 6 weeks. These appointments were made for 02/24/17. Pt was concerned with waiting this long for her PFT and OV. I reassured her that if TP wanted these done ASAP, we would have worked things out to do that. Pt was reassured again about her CT results and that the lung nodules were small. Before hanging up with the pt, she was feeling much better about waiting until the end of March for her appointments. Nothing further was needed at this time.

## 2017-01-15 ENCOUNTER — Encounter: Payer: Self-pay | Admitting: Family Medicine

## 2017-01-15 ENCOUNTER — Encounter: Payer: Self-pay | Admitting: General Practice

## 2017-01-15 ENCOUNTER — Ambulatory Visit (INDEPENDENT_AMBULATORY_CARE_PROVIDER_SITE_OTHER): Payer: Medicare Other | Admitting: Family Medicine

## 2017-01-15 DIAGNOSIS — K219 Gastro-esophageal reflux disease without esophagitis: Secondary | ICD-10-CM | POA: Diagnosis not present

## 2017-01-15 DIAGNOSIS — R918 Other nonspecific abnormal finding of lung field: Secondary | ICD-10-CM | POA: Diagnosis not present

## 2017-01-15 DIAGNOSIS — I495 Sick sinus syndrome: Secondary | ICD-10-CM

## 2017-01-15 DIAGNOSIS — Z0000 Encounter for general adult medical examination without abnormal findings: Secondary | ICD-10-CM | POA: Diagnosis not present

## 2017-01-15 LAB — HEPATIC FUNCTION PANEL
ALK PHOS: 72 U/L (ref 39–117)
ALT: 15 U/L (ref 0–35)
AST: 22 U/L (ref 0–37)
Albumin: 3.9 g/dL (ref 3.5–5.2)
BILIRUBIN DIRECT: 0.1 mg/dL (ref 0.0–0.3)
TOTAL PROTEIN: 6.8 g/dL (ref 6.0–8.3)
Total Bilirubin: 0.3 mg/dL (ref 0.2–1.2)

## 2017-01-15 LAB — BASIC METABOLIC PANEL
BUN: 14 mg/dL (ref 6–23)
CHLORIDE: 104 meq/L (ref 96–112)
CO2: 32 meq/L (ref 19–32)
Calcium: 9.1 mg/dL (ref 8.4–10.5)
Creatinine, Ser: 0.97 mg/dL (ref 0.40–1.20)
GFR: 59.31 mL/min — ABNORMAL LOW (ref >60.00)
GLUCOSE: 92 mg/dL (ref 70–99)
Potassium: 4.5 mEq/L (ref 3.5–5.1)
SODIUM: 139 meq/L (ref 135–145)

## 2017-01-15 LAB — CBC WITH DIFFERENTIAL/PLATELET
BASOS ABS: 0.1 10*3/uL (ref 0.0–0.1)
Basophils Relative: 0.8 % (ref 0.0–3.0)
EOS ABS: 0.4 10*3/uL (ref 0.0–0.7)
Eosinophils Relative: 4.6 % (ref 0.0–5.0)
HCT: 43.8 % (ref 36.0–46.0)
Hemoglobin: 14.7 g/dL (ref 12.0–15.0)
LYMPHS ABS: 2.6 10*3/uL (ref 0.7–4.0)
Lymphocytes Relative: 31.4 % (ref 12.0–46.0)
MCHC: 33.5 g/dL (ref 30.0–36.0)
MCV: 86.3 fl (ref 78.0–100.0)
Monocytes Absolute: 1.1 10*3/uL — ABNORMAL HIGH (ref 0.1–1.0)
Monocytes Relative: 12.7 % — ABNORMAL HIGH (ref 3.0–12.0)
NEUTROS ABS: 4.2 10*3/uL (ref 1.4–7.7)
NEUTROS PCT: 50.5 % (ref 43.0–77.0)
PLATELETS: 248 10*3/uL (ref 150.0–400.0)
RBC: 5.08 Mil/uL (ref 3.87–5.11)
RDW: 13.5 % (ref 11.5–15.5)
WBC: 8.4 10*3/uL (ref 4.0–10.5)

## 2017-01-15 LAB — LIPID PANEL
CHOLESTEROL: 182 mg/dL (ref 0–200)
HDL: 54.6 mg/dL (ref >39.00)
LDL Cholesterol: 94 mg/dL (ref 0–99)
NONHDL: 127.79
Total CHOL/HDL Ratio: 3
Triglycerides: 168 mg/dL — ABNORMAL HIGH (ref 0.0–149.0)
VLDL: 33.6 mg/dL (ref 0.0–40.0)

## 2017-01-15 MED ORDER — DIAZEPAM 5 MG PO TABS: 5.0000 mg | ORAL_TABLET | ORAL | 1 refills | Status: DC | PRN

## 2017-01-15 MED ORDER — PANTOPRAZOLE SODIUM 40 MG PO TBEC
40.0000 mg | DELAYED_RELEASE_TABLET | Freq: Every day | ORAL | 3 refills | Status: DC
Start: 2017-01-15 — End: 2017-05-28

## 2017-01-15 NOTE — Addendum Note (Signed)
Addended by: Gerilyn Nestle on: 01/15/2017 03:24 PM   Modules accepted: Miquel Dunn

## 2017-01-15 NOTE — Patient Instructions (Addendum)
Follow up in 6 months to recheck weight loss progress and BP We'll notify you of your lab results and make any changes if needed You are up to date on mammo (due April), colonoscopy (you don't need another!), and immunizations- yay!!! Try and make healthy food choices and get regular exercise- you can do it!! STOP the Omeprazole and START the Protonix for the reflux Call with any questions or concerns Happy Valentine's Day!!!  Try to eat heart healthy diet (full of fruits, vegetables, whole grains, lean protein, water--limit salt, fat, and sugar intake) and increase physical activity as tolerated.  Continue doing brain stimulating activities (puzzles, reading, adult coloring books, staying active) to keep memory sharp.   Bring a copy of your advance directives to your next office visit.     Fall Prevention in the Home Introduction Falls can cause injuries. They can happen to people of all ages. There are many things you can do to make your home safe and to help prevent falls. What can I do on the outside of my home?  Regularly fix the edges of walkways and driveways and fix any cracks.  Remove anything that might make you trip as you walk through a door, such as a raised step or threshold.  Trim any bushes or trees on the path to your home.  Use bright outdoor lighting.  Clear any walking paths of anything that might make someone trip, such as rocks or tools.  Regularly check to see if handrails are loose or broken. Make sure that both sides of any steps have handrails.  Any raised decks and porches should have guardrails on the edges.  Have any leaves, snow, or ice cleared regularly.  Use sand or salt on walking paths during winter.  Clean up any spills in your garage right away. This includes oil or grease spills. What can I do in the bathroom?  Use night lights.  Install grab bars by the toilet and in the tub and shower. Do not use towel bars as grab bars.  Use  non-skid mats or decals in the tub or shower.  If you need to sit down in the shower, use a plastic, non-slip stool.  Keep the floor dry. Clean up any water that spills on the floor as soon as it happens.  Remove soap buildup in the tub or shower regularly.  Attach bath mats securely with double-sided non-slip rug tape.  Do not have throw rugs and other things on the floor that can make you trip. What can I do in the bedroom?  Use night lights.  Make sure that you have a light by your bed that is easy to reach.  Do not use any sheets or blankets that are too big for your bed. They should not hang down onto the floor.  Have a firm chair that has side arms. You can use this for support while you get dressed.  Do not have throw rugs and other things on the floor that can make you trip. What can I do in the kitchen?  Clean up any spills right away.  Avoid walking on wet floors.  Keep items that you use a lot in easy-to-reach places.  If you need to reach something above you, use a strong step stool that has a grab bar.  Keep electrical cords out of the way.  Do not use floor polish or wax that makes floors slippery. If you must use wax, use non-skid floor wax.  Do  not have throw rugs and other things on the floor that can make you trip. What can I do with my stairs?  Do not leave any items on the stairs.  Make sure that there are handrails on both sides of the stairs and use them. Fix handrails that are broken or loose. Make sure that handrails are as long as the stairways.  Check any carpeting to make sure that it is firmly attached to the stairs. Fix any carpet that is loose or worn.  Avoid having throw rugs at the top or bottom of the stairs. If you do have throw rugs, attach them to the floor with carpet tape.  Make sure that you have a light switch at the top of the stairs and the bottom of the stairs. If you do not have them, ask someone to add them for you. What  else can I do to help prevent falls?  Wear shoes that:  Do not have high heels.  Have rubber bottoms.  Are comfortable and fit you well.  Are closed at the toe. Do not wear sandals.  If you use a stepladder:  Make sure that it is fully opened. Do not climb a closed stepladder.  Make sure that both sides of the stepladder are locked into place.  Ask someone to hold it for you, if possible.  Clearly mark and make sure that you can see:  Any grab bars or handrails.  First and last steps.  Where the edge of each step is.  Use tools that help you move around (mobility aids) if they are needed. These include:  Canes.  Walkers.  Scooters.  Crutches.  Turn on the lights when you go into a dark area. Replace any light bulbs as soon as they burn out.  Set up your furniture so you have a clear path. Avoid moving your furniture around.  If any of your floors are uneven, fix them.  If there are any pets around you, be aware of where they are.  Review your medicines with your doctor. Some medicines can make you feel dizzy. This can increase your chance of falling. Ask your doctor what other things that you can do to help prevent falls. This information is not intended to replace advice given to you by your health care provider. Make sure you discuss any questions you have with your health care provider. Document Released: 09/14/2009 Document Revised: 04/25/2016 Document Reviewed: 12/23/2014  2017 [employer]  Health Maintenance, Female Introduction Adopting a healthy lifestyle and getting preventive care can go a long way to promote health and wellness. Talk with your health care provider about what schedule of regular examinations is right for you. This is a good chance for you to check in with your provider about disease prevention and staying healthy. In between checkups, there are plenty of things you can do on your own. Experts have done a lot of research about which  lifestyle changes and preventive measures are most likely to keep you healthy. Ask your health care provider for more information. Weight and diet Eat a healthy diet  Be sure to include plenty of vegetables, fruits, low-fat dairy products, and lean protein.  Do not eat a lot of foods high in solid fats, added sugars, or salt.  Get regular exercise. This is one of the most important things you can do for your health.  Most adults should exercise for at least 150 minutes each week. The exercise should increase your heart rate  and make you sweat (moderate-intensity exercise).  Most adults should also do strengthening exercises at least twice a week. This is in addition to the moderate-intensity exercise. Maintain a healthy weight  Body mass index (BMI) is a measurement that can be used to identify possible weight problems. It estimates body fat based on height and weight. Your health care provider can help determine your BMI and help you achieve or maintain a healthy weight.  For females 53 years of age and older:  A BMI below 18.5 is considered underweight.  A BMI of 18.5 to 24.9 is normal.  A BMI of 25 to 29.9 is considered overweight.  A BMI of 30 and above is considered obese. Watch levels of cholesterol and blood lipids  You should start having your blood tested for lipids and cholesterol at 77 years of age, then have this test every 5 years.  You may need to have your cholesterol levels checked more often if:  Your lipid or cholesterol levels are high.  You are older than 77 years of age.  You are at high risk for heart disease. Cancer screening Lung Cancer  Lung cancer screening is recommended for adults 18-24 years old who are at high risk for lung cancer because of a history of smoking.  A yearly low-dose CT scan of the lungs is recommended for people who:  Currently smoke.  Have quit within the past 15 years.  Have at least a 30-pack-year history of smoking. A  pack year is smoking an average of one pack of cigarettes a day for 1 year.  Yearly screening should continue until it has been 15 years since you quit.  Yearly screening should stop if you develop a health problem that would prevent you from having lung cancer treatment. Breast Cancer  Practice breast self-awareness. This means understanding how your breasts normally appear and feel.  It also means doing regular breast self-exams. Let your health care provider know about any changes, no matter how small.  If you are in your 20s or 30s, you should have a clinical breast exam (CBE) by a health care provider every 1-3 years as part of a regular health exam.  If you are 21 or older, have a CBE every year. Also consider having a breast X-ray (mammogram) every year.  If you have a family history of breast cancer, talk to your health care provider about genetic screening.  If you are at high risk for breast cancer, talk to your health care provider about having an MRI and a mammogram every year.  Breast cancer gene (BRCA) assessment is recommended for women who have family members with BRCA-related cancers. BRCA-related cancers include:  Breast.  Ovarian.  Tubal.  Peritoneal cancers.  Results of the assessment will determine the need for genetic counseling and BRCA1 and BRCA2 testing. Cervical Cancer  Your health care provider may recommend that you be screened regularly for cancer of the pelvic organs (ovaries, uterus, and vagina). This screening involves a pelvic examination, including checking for microscopic changes to the surface of your cervix (Pap test). You may be encouraged to have this screening done every 3 years, beginning at age 74.  For women ages 43-65, health care providers may recommend pelvic exams and Pap testing every 3 years, or they may recommend the Pap and pelvic exam, combined with testing for human papilloma virus (HPV), every 5 years. Some types of HPV increase  your risk of cervical cancer. Testing for HPV may also be  done on women of any age with unclear Pap test results.  Other health care providers may not recommend any screening for nonpregnant women who are considered low risk for pelvic cancer and who do not have symptoms. Ask your health care provider if a screening pelvic exam is right for you.  If you have had past treatment for cervical cancer or a condition that could lead to cancer, you need Pap tests and screening for cancer for at least 20 years after your treatment. If Pap tests have been discontinued, your risk factors (such as having a new sexual partner) need to be reassessed to determine if screening should resume. Some women have medical problems that increase the chance of getting cervical cancer. In these cases, your health care provider may recommend more frequent screening and Pap tests. Colorectal Cancer  This type of cancer can be detected and often prevented.  Routine colorectal cancer screening usually begins at 77 years of age and continues through 77 years of age.  Your health care provider may recommend screening at an earlier age if you have risk factors for colon cancer.  Your health care provider may also recommend using home test kits to check for hidden blood in the stool.  A small camera at the end of a tube can be used to examine your colon directly (sigmoidoscopy or colonoscopy). This is done to check for the earliest forms of colorectal cancer.  Routine screening usually begins at age 7.  Direct examination of the colon should be repeated every 5-10 years through 77 years of age. However, you may need to be screened more often if early forms of precancerous polyps or small growths are found. Skin Cancer  Check your skin from head to toe regularly.  Tell your health care provider about any new moles or changes in moles, especially if there is a change in a mole's shape or color.  Also tell your health care  provider if you have a mole that is larger than the size of a pencil eraser.  Always use sunscreen. Apply sunscreen liberally and repeatedly throughout the day.  Protect yourself by wearing long sleeves, pants, a wide-brimmed hat, and sunglasses whenever you are outside. Heart disease, diabetes, and high blood pressure  High blood pressure causes heart disease and increases the risk of stroke. High blood pressure is more likely to develop in:  People who have blood pressure in the high end of the normal range (130-139/85-89 mm Hg).  People who are overweight or obese.  People who are African American.  If you are 67-15 years of age, have your blood pressure checked every 3-5 years. If you are 2 years of age or older, have your blood pressure checked every year. You should have your blood pressure measured twice-once when you are at a hospital or clinic, and once when you are not at a hospital or clinic. Record the average of the two measurements. To check your blood pressure when you are not at a hospital or clinic, you can use:  An automated blood pressure machine at a pharmacy.  A home blood pressure monitor.  If you are between 73 years and 74 years old, ask your health care provider if you should take aspirin to prevent strokes.  Have regular diabetes screenings. This involves taking a blood sample to check your fasting blood sugar level.  If you are at a normal weight and have a low risk for diabetes, have this test once every three years  after 77 years of age.  If you are overweight and have a high risk for diabetes, consider being tested at a younger age or more often. Preventing infection Hepatitis B  If you have a higher risk for hepatitis B, you should be screened for this virus. You are considered at high risk for hepatitis B if:  You were born in a country where hepatitis B is common. Ask your health care provider which countries are considered high risk.  Your parents  were born in a high-risk country, and you have not been immunized against hepatitis B (hepatitis B vaccine).  You have HIV or AIDS.  You use needles to inject street drugs.  You live with someone who has hepatitis B.  You have had sex with someone who has hepatitis B.  You get hemodialysis treatment.  You take certain medicines for conditions, including cancer, organ transplantation, and autoimmune conditions. Hepatitis C  Blood testing is recommended for:  Everyone born from [birth year] through 1966.  Anyone with known risk factors for hepatitis C. Sexually transmitted infections (STIs)  You should be screened for sexually transmitted infections (STIs) including gonorrhea and chlamydia if:  You are sexually active and are younger than 77 years of age.  You are older than 77 years of age and your health care provider tells you that you are at risk for this type of infection.  Your sexual activity has changed since you were last screened and you are at an increased risk for chlamydia or gonorrhea. Ask your health care provider if you are at risk.  If you do not have HIV, but are at risk, it may be recommended that you take a prescription medicine daily to prevent HIV infection. This is called pre-exposure prophylaxis (PrEP). You are considered at risk if:  You are sexually active and do not regularly use condoms or know the HIV status of your partner(s).  You take drugs by injection.  You are sexually active with a partner who has HIV. Talk with your health care provider about whether you are at high risk of being infected with HIV. If you choose to begin PrEP, you should first be tested for HIV. You should then be tested every 3 months for as long as you are taking PrEP. Pregnancy  If you are premenopausal and you may become pregnant, ask your health care provider about preconception counseling.  If you may become pregnant, take 400 to 800 micrograms (mcg) of folic acid every  day.  If you want to prevent pregnancy, talk to your health care provider about birth control (contraception). Osteoporosis and menopause  Osteoporosis is a disease in which the bones lose minerals and strength with aging. This can result in serious bone fractures. Your risk for osteoporosis can be identified using a bone density scan.  If you are 49 years of age or older, or if you are at risk for osteoporosis and fractures, ask your health care provider if you should be screened.  Ask your health care provider whether you should take a calcium or vitamin D supplement to lower your risk for osteoporosis.  Menopause may have certain physical symptoms and risks.  Hormone replacement therapy may reduce some of these symptoms and risks. Talk to your health care provider about whether hormone replacement therapy is right for you. Follow these instructions at home:  Schedule regular health, dental, and eye exams.  Stay current with your immunizations.  Do not use any tobacco products including cigarettes, chewing tobacco,  or electronic cigarettes.  If you are pregnant, do not drink alcohol.  If you are breastfeeding, limit how much and how often you drink alcohol.  Limit alcohol intake to no more than 1 drink per day for nonpregnant women. One drink equals 12 ounces of beer, 5 ounces of wine, or 1 ounces of hard liquor.  Do not use street drugs.  Do not share needles.  Ask your health care provider for help if you need support or information about quitting drugs.  Tell your health care provider if you often feel depressed.  Tell your health care provider if you have ever been abused or do not feel safe at home. This information is not intended to replace advice given to you by your health care provider. Make sure you discuss any questions you have with your health care provider. Document Released: 06/03/2011 Document Revised: 04/25/2016 Document Reviewed: 08/22/2015  2017  Elsevier  Heart-Healthy Eating Plan Introduction Heart-healthy meal planning includes:  Limiting unhealthy fats.  Increasing healthy fats.  Making other small dietary changes. You may need to talk with your doctor or a diet specialist (dietitian) to create an eating plan that is right for you. What types of fat should I choose?  Choose healthy fats. These include olive oil and canola oil, flaxseeds, walnuts, almonds, and seeds.  Eat more omega-3 fats. These include salmon, mackerel, sardines, tuna, flaxseed oil, and ground flaxseeds. Try to eat fish at least twice each week.  Limit saturated fats.  Saturated fats are often found in animal products, such as meats, butter, and cream.  Plant sources of saturated fats include palm oil, palm kernel oil, and coconut oil.  Avoid foods with partially hydrogenated oils in them. These include stick margarine, some tub margarines, cookies, crackers, and other baked goods. These contain trans fats. What general guidelines do I need to follow?  Check food labels carefully. Identify foods with trans fats or high amounts of saturated fat.  Fill one half of your plate with vegetables and green salads. Eat 4-5 servings of vegetables per day. A serving of vegetables is:  1 cup of raw leafy vegetables.   cup of raw or cooked cut-up vegetables.   cup of vegetable juice.  Fill one fourth of your plate with whole grains. Look for the word "whole" as the first word in the ingredient list.  Fill one fourth of your plate with lean protein foods.  Eat 4-5 servings of fruit per day. A serving of fruit is:  One medium whole fruit.   cup of dried fruit.   cup of fresh, frozen, or canned fruit.   cup of 100% fruit juice.  Eat more foods that contain soluble fiber. These include apples, broccoli, carrots, beans, peas, and barley. Try to get 20-30 g of fiber per day.  Eat more home-cooked food. Eat less restaurant, buffet, and fast  food.  Limit or avoid alcohol.  Limit foods high in starch and sugar.  Avoid fried foods.  Avoid frying your food. Try baking, boiling, grilling, or broiling it instead. You can also reduce fat by:  Removing the skin from poultry.  Removing all visible fats from meats.  Skimming the fat off of stews, soups, and gravies before serving them.  Steaming vegetables in water or broth.  Lose weight if you are overweight.  Eat 4-5 servings of nuts, legumes, and seeds per week:  One serving of dried beans or legumes equals  cup after being cooked.  One serving of  nuts equals 1 ounces.  One serving of seeds equals  ounce or one tablespoon.  You may need to keep track of how much salt or sodium you eat. This is especially true if you have high blood pressure. Talk with your doctor or dietitian to get more information. What foods can I eat? Grains  Breads, including [place], white, pita, wheat, raisin, rye, oatmeal, and [place]. Tortillas that are neither fried nor made with lard or trans fat. Low-fat rolls, including hotdog and hamburger buns and English muffins. Biscuits. Muffins. Waffles. Pancakes. Light popcorn. Whole-grain cereals. Flatbread. Melba toast. Pretzels. Breadsticks. Rusks. Low-fat snacks. Low-fat crackers, including oyster, saltine, matzo, graham, animal, and rye. Rice and pasta, including brown rice and pastas that are made with whole wheat. Vegetables  All vegetables. Fruits  All fruits, but limit coconut. Meats and Other Protein Sources  Lean, well-trimmed beef, veal, pork, and lamb. Chicken and [place] without skin. All fish and shellfish. Wild duck, rabbit, pheasant, and venison. Egg whites or low-cholesterol egg substitutes. Dried beans, peas, lentils, and tofu. Seeds and most nuts. Dairy  Low-fat or nonfat cheeses, including ricotta, string, and mozzarella. Skim or 1% milk that is liquid, powdered, or evaporated. Buttermilk that is made with low-fat milk. Nonfat or  low-fat yogurt. Beverages  Mineral water. Diet carbonated beverages. Sweets and Desserts  Sherbets and fruit ices. Honey, jam, marmalade, jelly, and syrups. Meringues and gelatins. Pure sugar candy, such as hard candy, jelly beans, gumdrops, mints, marshmallows, and small amounts of dark chocolate. [employer] Eat all sweets and desserts in moderation. Fats and Oils  Nonhydrogenated (trans-free) margarines. Vegetable oils, including soybean, sesame, sunflower, olive, peanut, safflower, corn, canola, and cottonseed. Salad dressings or mayonnaise made with a vegetable oil. Limit added fats and oils that you use for cooking, baking, salads, and as spreads. Other  Cocoa powder. Coffee and tea. All seasonings and condiments. The items listed above may not be a complete list of recommended foods or beverages. Contact your dietitian for more options.  What foods are not recommended? Grains  Breads that are made with saturated or trans fats, oils, or whole milk. Croissants. Butter rolls. Cheese breads. Sweet rolls. Donuts. Buttered popcorn. Chow mein noodles. High-fat crackers, such as cheese or butter crackers. Meats and Other Protein Sources  Fatty meats, such as hotdogs, short ribs, sausage, spareribs, bacon, rib eye roast or steak, and mutton. High-fat deli meats, such as salami and bologna. Caviar. Domestic duck and goose. Organ meats, such as kidney, liver, sweetbreads, and heart. Dairy  Cream, sour cream, cream cheese, and creamed cottage cheese. Whole-milk cheeses, including blue (bleu), Monte[place] Jack, [place], [place], [place], [place], Swiss, cheddar, [place], and [place]. Whole or 2% milk that is liquid, evaporated, or condensed. Whole buttermilk. Cream sauce or high-fat cheese sauce. Yogurt that is made from whole milk. Beverages  Regular sodas and juice drinks with added sugar. Sweets and Desserts  Frosting. Pudding. Cookies. Cakes other than angel food cake. Candy that has milk  chocolate or white chocolate, hydrogenated fat, butter, coconut, or unknown ingredients. Buttered syrups. Full-fat ice cream or ice cream drinks. Fats and Oils  Gravy that has suet, meat fat, or shortening. Cocoa butter, hydrogenated oils, palm oil, coconut oil, palm kernel oil. These can often be found in baked products, candy, fried foods, nondairy creamers, and whipped toppings. Solid fats and shortenings, including bacon fat, salt pork, lard, and butter. Nondairy cream substitutes, such as coffee creamers and sour cream substitutes. Salad dressings that are made  of unknown oils, cheese, or sour cream. The items listed above may not be a complete list of foods and beverages to avoid. Contact your dietitian for more information.  This information is not intended to replace advice given to you by your health care provider. Make sure you discuss any questions you have with your health care provider. Document Released: 05/19/2012 Document Revised: 04/25/2016 Document Reviewed: 05/12/2014  2017 [employer]vier

## 2017-01-15 NOTE — Progress Notes (Addendum)
Subjective:   [name] [name] is a 76 y.o. female who presents for Medicare Annual (Subsequent) preventive examination.  Review of Systems:  No ROS.  Medicare Wellness Visit.  Cardiac Risk Factors include: advanced age (>92mn, >>33women);family history of premature cardiovascular disease;sedentary lifestyle;obesity (BMI >30kg/m2)   Sleep patterns:Uses CPAP, sleeps 6 hours. Home Safety/Smoke Alarms:  Smoke detectors in place.  Living environment; residence and Firearm Safety: Lives alone in 1 story home with few steps at door, uses rail. Brother currently lives with patient (temporary).  Seat Belt Safety/Bike Helmet: Wears seat belt.   Counseling:   Eye Exam-Last exam 01/2016, yearly by Dr. MCharlesetta Shanksexam 07/2016, yearly by Dr. NMariea Clonts Dentures on top. Gum care discussed.   Female:   Pap-2016      Mammo-03/15/2016, normal      Dexa scan-06/01/2016, normal.       CCS-colonoscopy 07/11/2016, normal.       Objective:     Vitals: BP 120/72   Pulse 66   Temp 98.1 F (36.7 C) (Oral)   Resp 16   Ht _0  (1.702 m)   Wt 269 lb 4 oz (122.1 kg)   SpO2 98%   BMI 42.17 kg/m   Body mass index is 42.17 kg/m.   Tobacco History  Smoking Status  . Former Smoker  . Packs/day: 2.00  . Years: 18.00  . Types: Cigarettes  Smokeless Tobacco  . Never Used    Comment: "quit smoking in 1971"     Counseling given: Yes   Past Medical History:  Diagnosis Date  . 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 Arthritis    "all over"  . Atrial fibrillation ([place]   . Complication of anesthesia    "I woke up during 2 different procedures" (10/04/2014)  . Frequent UTI   . GERD (gastroesophageal reflux disease)   . Hypertension   . MVP (mitral valve prolapse)   . Obesity   . Presence of permanent cardiac pacemaker   . PVC's (premature ventricular contractions)   . Sinus arrest 10/2014   s/p Medtronic Advisa model [id number] number [id number]  . Stroke ([facility] 01/2014     denies deficits on 10/04/2014  . Tachy-brady syndrome ([place] 10/2014   Past Surgical History:  Procedure Laterality Date  . EXCISION VAGINAL CYST     benign nodules  . INSERT / REPLACE / REMOVE PACEMAKER  10/04/2014   Medtronic Advisa model [id number] number [id number]  . LOOP RECORDER EXPLANT N/A 10/04/2014   Procedure: LOOP RECORDER EXPLANT;  Surgeon: MSanda Klein MD;  [facility];  Service: Cardiovascular;  Laterality: N/A;  . LOOP RECORDER IMPLANT N/A 04/19/2014   Procedure: LOOP RECORDER IMPLANT;  Surgeon: MSanda Klein MD;  [place] [facility];  Service: Cardiovascular;  Laterality: N/A;  . NM MYOCAR PERF WALL MOTION  12/18/2007   normal  . PERMANENT PACEMAKER INSERTION N/A 10/04/2014   Procedure: PERMANENT PACEMAKER INSERTION;  Surgeon: MSanda Klein MD;  [place];  Service: Cardiovascular;  Laterality: N/A;  . TUBAL LIGATION    . [place]  05/15/2010   LA mildly dilated,mild mitral annular ca+, AOV mildly sclerotic, mild asymmetric LVH   Family History  Problem Relation Age of Onset  . Heart attack Mother   . Stroke Father   . Heart failure Father   . Heart failure Brother   . Stroke Brother    History  Sexual Activity  . Sexual activity: No  Outpatient Encounter Prescriptions as of 01/15/2017  Medication Sig  . apixaban (ELIQUIS) 5 MG TABS tablet Take 1 tablet (5 mg total) by mouth 2 (two) times daily.  . Biotin 5000 MCG CAPS Take 5,000 mcg by mouth daily.   . Calcium Carbonate-Vit D-Min (CALCIUM 1200 PO) Take 2,400 mg by mouth daily.   . cholecalciferol (VITAMIN D) 1000 UNITS tablet Take 1,000 Units by mouth daily.   Marland Kitchen Co-Enzyme Q10 100 MG CAPS Take 100 mg by mouth daily.   . diazepam (VALIUM) 5 MG tablet Take 1 tablet (5 mg total) by mouth as needed for anxiety.  . folic acid (FOLVITE) 761 MCG tablet Take 400 mcg by mouth daily.  Marland Kitchen glucosamine-chondroitin 500-400 MG tablet Take 1 tablet by mouth daily.   Marland Kitchen loratadine  (KLS ALLERCLEAR) 10 MG tablet Take 10 mg by mouth daily.  Marland Kitchen MAGNESIUM CITRATE PO Take 250 mg by mouth daily. 1 tab daily  . metoprolol tartrate (LOPRESSOR) 25 MG tablet Take 1 tablet (25 mg total) by mouth 2 (two) times daily.  . nitroGLYCERIN (NITROSTAT) 0.4 MG SL tablet Place 1 tablet (0.4 mg total) under the tongue every 5 (five) minutes as needed for chest pain.  Marland Kitchen Resveratrol 250 MG CAPS Take 250 mg by mouth daily.  . sertraline (ZOLOFT) 100 MG tablet Take 1 tablet (100 mg total) by mouth daily.  . Simethicone (GAS-X PO) Take 2 tablets by mouth daily as needed (bloating).  . [DISCONTINUED] diazepam (VALIUM) 5 MG tablet Take 5 mg by mouth as needed for anxiety.   . pantoprazole (PROTONIX) 40 MG tablet Take 1 tablet (40 mg total) by mouth daily.   No facility-administered encounter medications on file as of 01/15/2017.     Activities of Daily Living In your present state of health, do you have any difficulty performing the following activities: 01/15/2017 01/15/2017  Hearing? N -  Vision? N -  Difficulty concentrating or making decisions? N -  Walking or climbing stairs? Y -  Dressing or bathing? N -  Doing errands, shopping? N -  Preparing Food and eating ? N N  Using the Toilet? N N  In the past six months, have you accidently leaked urine? N Y  Do you have problems with loss of bowel control? N N  Managing your Medications? N N  Managing your Finances? N N  Housekeeping or managing your Housekeeping? N N  Some recent data might be hidden    Patient Care Team: Midge Minium, MD as PCP - General (Family Medicine) Maisie Fus, MD as Consulting Physician (Obstetrics and Gynecology) Richmond Campbell, MD as Consulting Physician (Gastroenterology) Sanda Klein, MD as Consulting Physician (Cardiology) Kathie Rhodes, MD as Consulting Physician (Urology) Deneise Lever, MD as Consulting Physician (Pulmonary Disease) Danella Sensing, MD as Consulting Physician (Dermatology)     Assessment:    Physical assessment deferred to PCP.  Exercise Activities and Dietary recommendations Current Exercise Habits: The patient does not participate in regular exercise at present, Exercise limited by: cardiac condition(s);orthopedic condition(s);respiratory conditions(s)  Diet (meal preparation, eat out, water intake, caffeinated beverages, dairy products, fruits and vegetables): Drinks water.   Breakfast: cereal, fruit Lunch and Dinner: sandwich, soup, left overs.   Snacks: candy, pb crackers, chips with dip.  Discussed and provided heart healthy diet. Encouraged to be more active (as tolerated and approved by cardiology and pulmonologist).   Goals    . Weight (lb) < 240 lb (108.9 kg)  Would like to lose weight by increasing activity and making healthier food choices.       Fall Risk Fall Risk  01/15/2017 01/15/2017 07/15/2016  Falls in the past year? No No No   Depression Screen PHQ 2/9 Scores 01/15/2017 07/15/2016  PHQ - 2 Score 0 0     Cognitive Function       Ad8 score reviewed for issues:  Issues making decisions: no  Less interest in hobbies / activities:  Repeats questions, stories (family complaining):no  Trouble using ordinary gadgets (microwave, computer, phone):no  Forgets the month or year: no  Mismanaging finances:  no  Remembering appts: no  Daily problems with thinking and/or memory:no Ad8 score is=0     Immunization History  Administered Date(s) Administered  . Influenza Whole 09/23/2008  . Influenza-Unspecified 09/17/2014, 09/09/2016  . Pneumococcal Conjugate-13 10/16/2015  . Pneumococcal-Unspecified 10/01/2014   Screening Tests Health Maintenance  Topic Date Due  . ZOSTAVAX  09/01/2017 (Originally 10/20/2000)  . TETANUS/TDAP  09/01/2017 (Originally 10/21/1959)  . MAMMOGRAM  03/15/2017  . INFLUENZA VACCINE  Completed  . DEXA SCAN  Completed  . PNA vac Low Risk Adult  Completed      Plan:     Try to eat  heart healthy diet (full of fruits, vegetables, whole grains, lean protein, water--limit salt, fat, and sugar intake) and increase physical activity as tolerated.  Continue doing brain stimulating activities (puzzles, reading, adult coloring books, staying active) to keep memory sharp.   Bring a copy of your advance directives to your next office visit.   During the course of the visit the patient was educated and counseled about the following appropriate screening and preventive services:   Vaccines to include Pneumoccal, Influenza, Hepatitis B, Td, Zostavax, HCV  Cardiovascular Disease  Colorectal cancer screening  Bone density screening  Diabetes screening  Glaucoma screening  Mammography/PAP  Nutrition counseling   Patient Instructions (the written plan) was given to the patient.   Gerilyn Nestle, RN  01/15/2017

## 2017-01-15 NOTE — Assessment & Plan Note (Signed)
Pt w/ multiple lung nodules.  Following w/ Dr Annamaria Boots- has appt upcoming.  Continues to have SOB and cough.  No evidence of bacterial infxn on exam today.  Will follow along.

## 2017-01-15 NOTE — Assessment & Plan Note (Signed)
Ongoing issue for pt.  She continues to gain weight.  BMI now 42.17.  Stressed need for healthy diet and regular exercise.  Check labs to risk stratify.  Will follow.

## 2017-01-15 NOTE — Assessment & Plan Note (Signed)
Ongoing issue.  Pt is following w/ Dr C.  Pulse was initially 31 but came up to 66.  Pt is asymptomatic at this time.  Has pacer in place.  Will not adjust meds at this time.  Will follow along and assist as able.

## 2017-01-15 NOTE — Progress Notes (Signed)
Pre visit review using our clinic review tool, if applicable. No additional management support is needed unless otherwise documented below in the visit note

## 2017-01-15 NOTE — Progress Notes (Addendum)
   Subjective:    Patient ID: [name], female    DOB: 1941/06/26, 76 y.o.   MRN: [id number]  HPI Obesity- pt's BMI is 42.17  Pt is using CPAP nightly due to size.  Not exercising or following a particular diet  Pulmonary nodules- noted on CT of chest.  Based on recommendations she is supposed to have f/u CT in 3 months.  She has upcoming appt and PFTs w/ Dr Annamaria Boots.  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  Pt reports ongoing cough, SOB x9 months.  Pt is a former smoker.  Tachy-Brady- pt's pulse today was 31 initially but rebounded to 66.  Denies CP.  + ongoing SOB.  No edema.  Anxiety- Well controlled on Zoloft and Valium prn.  Pt has been getting Valium from Dr Nori Riis but would prefer to get meds here if possible.  GERD- ongoing issue for pt.  Taking OTC Omeprazole w/o improvement.  Continues to have regular symptoms.     Review of Systems For ROS see HPI     Objective:   Physical Exam General Appearance:    Alert, cooperative, no distress, appears stated age, obese  Head:    Normocephalic, without obvious abnormality, atraumatic  Eyes:    PERRL, conjunctiva/corneas clear, EOM's intact, fundi    benign, both eyes  Ears:    Normal TM's and external ear canals, both ears  Nose:   Nares normal, septum midline, mucosa normal, no drainage    or sinus tenderness  Throat:   Lips, mucosa, and tongue normal; teeth and gums normal  Neck:   Supple, symmetrical, trachea midline, no adenopathy;    Thyroid: no enlargement/tenderness/nodules  Back:     Symmetric, no curvature, ROM normal, no CVA tenderness  Lungs:     Clear to auscultation bilaterally, respirations unlabored  Chest Wall:    No tenderness or deformity   Heart:    Regular rate and rhythm, S1 and S2 normal, no murmur, rub   or gallop  Breast Exam:    Deferred to GYN  Abdomen:     Soft, non-tender, bowel sounds active all four quadrants,    no masses, no organomegaly  Genitalia:    Deferred to GYN  Rectal:      Extremities:   Extremities normal, atraumatic, no cyanosis or edema  Pulses:   2+ and symmetric all extremities  Skin:   Skin color, texture, turgor normal, no rashes or lesions  Lymph nodes:   Cervical, supraclavicular, and axillary nodes normal  Neurologic:   CNII-XII intact, normal strength, sensation and reflexes    throughout          Assessment & Plan:  Reviewed AWV documentation provided by RN- agree w/ what is written.

## 2017-01-15 NOTE — Assessment & Plan Note (Signed)
Deteriorated.  Stop OTC Omeprazole and switch to Protonix.  Reviewed dietary and lifestyle modifications.  Will follow.

## 2017-01-16 ENCOUNTER — Encounter: Payer: Self-pay | Admitting: General Practice

## 2017-01-17 ENCOUNTER — Telehealth: Payer: Self-pay | Admitting: Family Medicine

## 2017-01-17 NOTE — Telephone Encounter (Signed)
My fault, I accidentally placed it at the front desk for pick up. It will be faxed today.

## 2017-01-17 NOTE — Telephone Encounter (Signed)
Please call [facility] to provide clarification on diazepam sig. Attached phone # above

## 2017-01-17 NOTE — Telephone Encounter (Signed)
Called and medication SIG clarified.

## 2017-01-17 NOTE — Telephone Encounter (Signed)
Patient is calling because valium was not at the pharmacy.  She said only one of her medications was called in.  Thank you,  -LL

## 2017-01-28 ENCOUNTER — Ambulatory Visit (INDEPENDENT_AMBULATORY_CARE_PROVIDER_SITE_OTHER): Payer: Medicare Other | Admitting: Family Medicine

## 2017-01-28 ENCOUNTER — Encounter: Payer: Self-pay | Admitting: Family Medicine

## 2017-01-28 VITALS — BP 121/76 | HR 70 | Temp 98.1°F | Resp 16 | Ht 67.0 in

## 2017-01-28 DIAGNOSIS — M7061 Trochanteric bursitis, right hip: Secondary | ICD-10-CM

## 2017-01-28 DIAGNOSIS — R300 Dysuria: Secondary | ICD-10-CM

## 2017-01-28 DIAGNOSIS — R8299 Other abnormal findings in urine: Secondary | ICD-10-CM

## 2017-01-28 DIAGNOSIS — R82998 Other abnormal findings in urine: Secondary | ICD-10-CM

## 2017-01-28 LAB — POCT URINALYSIS DIPSTICK
Bilirubin, UA: NEGATIVE
GLUCOSE UA: NEGATIVE
Ketones, UA: NEGATIVE
NITRITE UA: NEGATIVE
Protein, UA: NEGATIVE
Spec Grav, UA: 1.015
UROBILINOGEN UA: 0.2
pH, UA: 6

## 2017-01-28 MED ORDER — NITROFURANTOIN MONOHYD MACRO 100 MG PO CAPS
100.0000 mg | ORAL_CAPSULE | Freq: Two times a day (BID) | ORAL | 0 refills | Status: DC
Start: 2017-01-28 — End: 2017-04-21

## 2017-01-28 MED ORDER — PREDNISONE 20 MG PO TABS: 20.0000 mg | ORAL_TABLET | Freq: Every day | ORAL | 0 refills | Status: DC

## 2017-01-28 MED ORDER — CIPROFLOXACIN HCL 250 MG PO TABS: 250.0000 mg | ORAL_TABLET | Freq: Two times a day (BID) | ORAL | 0 refills | Status: DC

## 2017-01-28 NOTE — Patient Instructions (Signed)
Follow up as needed/scheduled Start Prednisone once daily w/ food Do some gentle stretching of the R hip- rotate outward until you feel a pull and then hold, pull knee to chest and hold Alternate ice and heat- or whichever feels better Call with any questions or concerns- particularly if no improvement Hang in there!!!

## 2017-01-28 NOTE — Progress Notes (Signed)
Pre visit review using our clinic review tool, if applicable. No additional management support is needed unless otherwise documented below in the visit note

## 2017-01-28 NOTE — Addendum Note (Signed)
Addended by: Midge Minium on: 01/28/2017 11:37 AM   Modules accepted: Orders

## 2017-01-28 NOTE — Progress Notes (Signed)
   Subjective:    [name], female    DOB: 01-15-1941, 76 y.o.   MRN: [id number]  HPI Leg pain- sxs started 5 days, R sided.  Describes it as a 'buckling'.  Pt reports pain started in foot, traveled up leg and across anterior knee cap, up thigh and into R groin.  Pt spoke w/ nurse through [facility] and they told her to work on some stretching.  Pt reports this has helped somewhat.  Able to walk on level ground in a straight line.  External rotation of hip is still painful.  Dysuria- started this AM.  Hx of UTIs.  No blood.  No frequency.  Review of Systems For ROS see HPI     Objective:   Physical Exam  Constitutional: She is oriented to person, place, and time. She appears well-developed and well-nourished. No distress.  HENT:  Head: Normocephalic and atraumatic.  Cardiovascular: Intact distal pulses.   Musculoskeletal: She exhibits tenderness (TTP over R greater trochanteric bursa). She exhibits no edema.  Pain w/ external rotation of R hip Full flexion/extension of R hip No pain w/ internal rotation of R hip  Neurological: She is alert and oriented to person, place, and time. She has normal reflexes. No cranial nerve deficit. Coordination normal.  Skin: Skin is warm and dry. No rash noted. No erythema.  Psychiatric: She has a normal mood and affect. Her behavior is normal. Thought content normal.  Vitals reviewed.         Assessment & Plan:  R trochanteric bursitis- new.  Pt is on Eliquis and not able to take NSAIDs.  Start low dose pred burst to improve pain.  Reviewed stretching w/ pt.  Alternate ice/heat.  Reviewed supportive care and red flags that should prompt return.  Pt expressed understanding and is in agreement w/ plan.   Dysuria- pt has hx of UTIs.  UA + for blood and leuks.  Start Cipro until culture results available.  Pt expressed understanding and is in agreement w/ plan.

## 2017-01-29 LAB — URINE CULTURE

## 2017-02-04 DIAGNOSIS — H5203 Hypermetropia, bilateral: Secondary | ICD-10-CM | POA: Diagnosis not present

## 2017-02-04 DIAGNOSIS — H2513 Age-related nuclear cataract, bilateral: Secondary | ICD-10-CM | POA: Diagnosis not present

## 2017-02-04 DIAGNOSIS — H52223 Regular astigmatism, bilateral: Secondary | ICD-10-CM | POA: Diagnosis not present

## 2017-02-04 DIAGNOSIS — H43813 Vitreous degeneration, bilateral: Secondary | ICD-10-CM | POA: Diagnosis not present

## 2017-02-07 ENCOUNTER — Telehealth: Payer: Self-pay | Admitting: Cardiovascular Disease

## 2017-02-12 DIAGNOSIS — L821 Other seborrheic keratosis: Secondary | ICD-10-CM | POA: Diagnosis not present

## 2017-02-13 ENCOUNTER — Telehealth: Payer: Self-pay | Admitting: Internal Medicine

## 2017-02-13 NOTE — Telephone Encounter (Signed)
Spoke with the pt  She is asking when her CT Chest needs to be repeated  I advised per result notes on last ct done 01/06/17 that CT would need to be repeated in 3 months:   Notes Recorded by Melvenia Needles, NP on 01/07/2017 at 9:19 AM EST CT chest does show some scarring in bases of lungs and scattered very small lung nodules  This could be contributing to some of her dyspnea.  Would like her to have full PFT and ov to discuss in more detail with Dr. Annamaria Boots In 6 weeks .  Will need CT chest w/o contrast in 3 months to follow lung nodules.  She has ov with CY 02/24/17  I advised to just keep ov and we can set up ct for May at that time  She verbalized understanding and ok with this plan  Nothing further needed

## 2017-02-19 DIAGNOSIS — R350 Frequency of micturition: Secondary | ICD-10-CM | POA: Diagnosis not present

## 2017-02-19 DIAGNOSIS — R300 Dysuria: Secondary | ICD-10-CM | POA: Diagnosis not present

## 2017-02-24 ENCOUNTER — Encounter: Payer: Self-pay | Admitting: Internal Medicine

## 2017-02-24 ENCOUNTER — Ambulatory Visit (INDEPENDENT_AMBULATORY_CARE_PROVIDER_SITE_OTHER): Payer: Medicare Other | Admitting: Internal Medicine

## 2017-02-24 ENCOUNTER — Other Ambulatory Visit (INDEPENDENT_AMBULATORY_CARE_PROVIDER_SITE_OTHER): Payer: Medicare Other

## 2017-02-24 VITALS — BP 136/80 | HR 93 | Ht 66.0 in | Wt 271.0 lb

## 2017-02-24 DIAGNOSIS — R918 Other nonspecific abnormal finding of lung field: Secondary | ICD-10-CM

## 2017-02-24 DIAGNOSIS — R0609 Other forms of dyspnea: Secondary | ICD-10-CM | POA: Diagnosis not present

## 2017-02-24 DIAGNOSIS — M1990 Unspecified osteoarthritis, unspecified site: Secondary | ICD-10-CM

## 2017-02-24 DIAGNOSIS — Z9989 Dependence on other enabling machines and devices: Secondary | ICD-10-CM | POA: Diagnosis not present

## 2017-02-24 DIAGNOSIS — G4733 Obstructive sleep apnea (adult) (pediatric): Secondary | ICD-10-CM

## 2017-02-24 DIAGNOSIS — R0602 Shortness of breath: Secondary | ICD-10-CM

## 2017-02-24 LAB — PULMONARY FUNCTION TEST
DL/VA % PRED: 79 %
DL/VA: 4.01 ml/min/mmHg/L
DLCO cor % pred: 55 %
DLCO cor: 14.93 ml/min/mmHg
DLCO unc % pred: 59 %
DLCO unc: 16.15 ml/min/mmHg
FEF 25-75 POST: 2.79 L/s
FEF 25-75 PRE: 2.67 L/s
FEF2575-%Change-Post: 4 %
FEF2575-%PRED-POST: 163 %
FEF2575-%PRED-PRE: 156 %
FEV1-%Change-Post: 1 %
FEV1-%Pred-Post: 94 %
FEV1-%Pred-Pre: 93 %
FEV1-PRE: 2.12 L
FEV1-Post: 2.14 L
FEV1FVC-%Change-Post: 6 %
FEV1FVC-%PRED-PRE: 109 %
FEV6-%Change-Post: -4 %
FEV6-%PRED-POST: 85 %
FEV6-%Pred-Pre: 90 %
FEV6-POST: 2.45 L
FEV6-Pre: 2.58 L
FEV6FVC-%PRED-POST: 105 %
FEV6FVC-%Pred-Pre: 105 %
FVC-%CHANGE-POST: -4 %
FVC-%Pred-Post: 81 %
FVC-%Pred-Pre: 85 %
FVC-PRE: 2.58 L
FVC-Post: 2.45 L
PRE FEV1/FVC RATIO: 82 %
Post FEV1/FVC ratio: 87 %
Post FEV6/FVC ratio: 100 %
Pre FEV6/FVC Ratio: 100 %

## 2017-02-24 LAB — CBC WITH DIFFERENTIAL/PLATELET
Basophils Absolute: 0.1 10*3/uL (ref 0.0–0.1)
Basophils Relative: 0.8 % (ref 0.0–3.0)
EOS ABS: 0.3 10*3/uL (ref 0.0–0.7)
Eosinophils Relative: 3.6 % (ref 0.0–5.0)
HCT: 43.2 % (ref 36.0–46.0)
Hemoglobin: 14.6 g/dL (ref 12.0–15.0)
Lymphocytes Relative: 30.9 % (ref 12.0–46.0)
Lymphs Abs: 2.8 10*3/uL (ref 0.7–4.0)
MCHC: 33.9 g/dL (ref 30.0–36.0)
MCV: 84.8 fl (ref 78.0–100.0)
MONO ABS: 1.3 10*3/uL — AB (ref 0.1–1.0)
Monocytes Relative: 14 % — ABNORMAL HIGH (ref 3.0–12.0)
NEUTROS ABS: 4.6 10*3/uL (ref 1.4–7.7)
Neutrophils Relative %: 50.7 % (ref 43.0–77.0)
PLATELETS: 251 10*3/uL (ref 150.0–400.0)
RBC: 5.09 Mil/uL (ref 3.87–5.11)
RDW: 13.5 % (ref 11.5–15.5)
WBC: 9.1 10*3/uL (ref 4.0–10.5)

## 2017-02-24 LAB — SEDIMENTATION RATE: SED RATE: 31 mm/h — AB (ref 0–30)

## 2017-02-24 MED ORDER — FLUTICASONE FUROATE-VILANTEROL 100-25 MCG/INH IN AEPB: 1.0000 | INHALATION_SPRAY | Freq: Every day | RESPIRATORY_TRACT | 0 refills | Status: DC

## 2017-02-24 NOTE — Progress Notes (Signed)
PFT done today.

## 2017-02-24 NOTE — Progress Notes (Signed)
HPI  female former smoker followed for dyspnea on exertion, lung nodules, fibrosis, complicated by A. 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 She does not qualify for portable oxygen ------------------------------------------------------------------------------------------  02/24/2017-76 year old female former smoker followed for dyspnea on exertion, lung nodules, fibrosis, complicated by A. 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 fib, schedule PFT. Reviewed CT chest. Which shows very minimal nonspecific fibrotic changes peripherally. This will need to be watched. Normal nuclear stress test by cardiology 12/27/2016 CBC 01/15/2017-not anemic Disability parking. She has gained most of 40 pounds in the last year and she accepts this is important for her dyspnea on exertion. There is some variation from day-to-day with her dyspnea. Much cough with white/yellow/green sputum. No sinus discomfort, sweating or fever. No adenopathy or blood. Admits some wheezing. Admits she is very depressed about declining health of her daughter who has alpha-1 antitrypsin disease ZZ / chronic respiratory failure, oxygen and wheelchair dependent. Cardiology manages her CPAP and she says she sleeps better using it/Advanced. PFT 02/24/2017-moderate restriction, moderately severe diffusion deficit FVC 2.45/81%, FEV1 2.14/94%, ratio 0.87, TLC 62%, DLCO 59% Walk Test on room air 02/24/2017-94%, 93%, 92%, 92%. She does not qualify for portable oxygen CT chest 01/06/2017 IMPRESSION: 1. Mild basilar predominant subpleural fibrosis, new or progressive from 07/19/2008. Differential diagnosis includes nonspecific interstitial pneumonitis as well as early/mild  usual interstitial pneumonitis. 2. Scattered pulmonary nodules measure up to 8 mm. Non-contrast chest CT at 3-6 months is recommended. If the nodules are stable at time of repeat CT, then future CT at 18-24 months (from today's scan) is considered optional for low-risk patients, but is recommended for high-risk patients. This recommendation follows the consensus statement: Guidelines for Management of Incidental Pulmonary Nodules Detected on CT Images: From the Fleischner Society 2017; Radiology 2017; 284:228-243. 3.  Aortic atherosclerosis (ICD10-170.0).

## 2017-02-24 NOTE — Patient Instructions (Addendum)
Order- walk test on room air    Dx dyspnea on exertion  Sample Breo 100   Inhale 1 puff then rinse mouth, once daily  I really think if you can make a steady effort to move and exercise and walk more, you will feel your stamina improving  Order- schedule CT chest- no contrast - in June- dx lung nodules  Order- lab- CBC w diff, ANA, sed rate, Rheumatoid factor    Dx arthritis  Please call as needed

## 2017-02-25 ENCOUNTER — Telehealth: Payer: Self-pay | Admitting: Internal Medicine

## 2017-02-25 LAB — RHEUMATOID FACTOR

## 2017-02-25 NOTE — Telephone Encounter (Signed)
Pt wanted to know when she should call back to let us know how Memory Dance is working for her. Pt will contact us after 10 days of use.   Also, patient is concerned about dx from [facility] yesterday based on CT results below: CT chest 01/06/2017 IMPRESSION: 1. Mild basilar predominant subpleural fibrosis, new or progressive from 07/19/2008. Differential diagnosis includes nonspecific interstitial pneumonitis as well as early/mild usual interstitial pneumonitis. 2. Scattered pulmonary nodules measure up to 8 mm. Non-contrast chest CT at 3-6 months is recommended. If the nodules are stable at time of repeat CT, then future CT at 18-24 months (from today's scan) is considered optional for low-risk patients, but is recommended for high-risk patients. This recommendation follows the consensus statement: Guidelines for Management of Incidental Pulmonary Nodules Detected on CT Images: From the Fleischner Society 2017; Radiology 2017; 284:228-243. 3. Aortic atherosclerosis (ICD10-170.0).    Pt requests a call from CY to discuss in better detail. Thanks.

## 2017-02-26 LAB — ANTI-NUCLEAR AB-TITER (ANA TITER)

## 2017-02-26 LAB — ANA: Anti Nuclear Antibody(ANA): POSITIVE — AB

## 2017-02-26 NOTE — Telephone Encounter (Signed)
I returned her call as requested, left message to call us back on her ans machine.

## 2017-03-02 NOTE — Assessment & Plan Note (Signed)
She describes good compliance and control. CPAP is managed by cardiology.

## 2017-03-02 NOTE — Assessment & Plan Note (Signed)
She has gained most of 40 pounds in the past year. She attributes this to depression about the declining health of her daughter. The vicious cycle of difficulty exercising because of overweight is also apparent.

## 2017-03-02 NOTE — Assessment & Plan Note (Signed)
Not sure if her obesity alone is sufficient to explain reduced diffusion capacity but it would explain her restrictive pattern on PFT. Very mild changes of fibrosis on CT don't look severe enough to explain all of this yet but will indicate significant problems if they are progressive. Certainly correcting her obesity with substantially help her exercise tolerance.

## 2017-03-02 NOTE — Assessment & Plan Note (Addendum)
We reviewed her CT scan images and discussed her PFT. Plan-follow-up CT in June, watching for progression of fibrosis and nodules. Labs ordered looking for inflammatory markers which might correlate with interstitial fibrosis

## 2017-03-03 ENCOUNTER — Telehealth: Payer: Self-pay | Admitting: Internal Medicine

## 2017-03-03 NOTE — Telephone Encounter (Signed)
lmtcb X1 for pt  

## 2017-03-04 ENCOUNTER — Telehealth: Payer: Self-pay | Admitting: Family Medicine

## 2017-03-04 DIAGNOSIS — R700 Elevated erythrocyte sedimentation rate: Secondary | ICD-10-CM

## 2017-03-04 DIAGNOSIS — R768 Other specified abnormal immunological findings in serum: Secondary | ICD-10-CM

## 2017-03-04 DIAGNOSIS — J8410 Pulmonary fibrosis, unspecified: Secondary | ICD-10-CM

## 2017-03-04 MED ORDER — FLUTICASONE FUROATE-VILANTEROL 100-25 MCG/INH IN AEPB: 1.0000 | INHALATION_SPRAY | Freq: Every day | RESPIRATORY_TRACT | 11 refills | Status: DC

## 2017-03-04 NOTE — Telephone Encounter (Signed)
Pt calling regarding labs from Dr. Annamaria Boots, pt was advise to call us for results.

## 2017-03-04 NOTE — Telephone Encounter (Signed)
Spoke with pt and advised of results. Referral placed.

## 2017-03-04 NOTE — Telephone Encounter (Signed)
Reviewing in Dr. Virgil Benedict absence. Would recommend giving positive ESR, ANA and ANA titer, that we set her up with rheumatology for further evaluation of potential lupus.

## 2017-03-04 NOTE — Telephone Encounter (Signed)
Spoke with the pt She states that she thinks that she may have taken her Memory Dance wrong  She could not explain why she thought this, and seemed offended that I asked in what way she thinks she used it wrong She does state it seems to be helping her, despite using it wrong one time? I will send rx per her request  We discussed her labs and she is aware to call Dr Birdie Riddle to discuss  Lastly, she states that she was speaking with CDY regarding her CT results and wants to speak with him again  Please advise thanks

## 2017-03-04 NOTE — Telephone Encounter (Signed)
See PN dated 03/03/17

## 2017-03-04 NOTE — Telephone Encounter (Signed)
LMOM to call us back tomorrow if she still wishes to discuss results.

## 2017-03-04 NOTE — Progress Notes (Signed)
Spoke with pt and notified of results per Dr. Annamaria Boots. Pt verbalized understanding and denied any questions.

## 2017-03-04 NOTE — Telephone Encounter (Signed)
Please advise? Per chart Dr. Janee Morn office advised of results today.

## 2017-03-06 NOTE — Telephone Encounter (Signed)
Pt is returning CY's call in regards to CT results. Pt states she will be home until 2pm today.  CY please. Thanks.

## 2017-03-06 NOTE — Telephone Encounter (Signed)
She wanted me to go over the results of CT again in context of elevated ANA. Has appointment with Rheumatology. She was scared by on-line discussion of pulmonary fibrosis. I explained that discussion may not apply to her.

## 2017-03-06 NOTE — Telephone Encounter (Signed)
Patient returned phone call, contact # [phone].[name] [name]

## 2017-03-12 DIAGNOSIS — Z1231 Encounter for screening mammogram for malignant neoplasm of breast: Secondary | ICD-10-CM | POA: Diagnosis not present

## 2017-03-12 DIAGNOSIS — Z803 Family history of malignant neoplasm of breast: Secondary | ICD-10-CM | POA: Diagnosis not present

## 2017-03-22 LAB — HM MAMMOGRAPHY: HM Mammogram: NORMAL (ref 0–4)

## 2017-04-01 DIAGNOSIS — Z6841 Body Mass Index (BMI) 40.0 and over, adult: Secondary | ICD-10-CM | POA: Diagnosis not present

## 2017-04-01 DIAGNOSIS — J849 Interstitial pulmonary disease, unspecified: Secondary | ICD-10-CM | POA: Diagnosis not present

## 2017-04-01 DIAGNOSIS — M2550 Pain in unspecified joint: Secondary | ICD-10-CM | POA: Diagnosis not present

## 2017-04-01 DIAGNOSIS — R768 Other specified abnormal immunological findings in serum: Secondary | ICD-10-CM | POA: Diagnosis not present

## 2017-04-07 ENCOUNTER — Ambulatory Visit (INDEPENDENT_AMBULATORY_CARE_PROVIDER_SITE_OTHER): Payer: Medicare Other | Admitting: *Deleted

## 2017-04-07 DIAGNOSIS — I495 Sick sinus syndrome: Secondary | ICD-10-CM | POA: Diagnosis not present

## 2017-04-07 NOTE — Progress Notes (Signed)
Remote pacemaker transmission.

## 2017-04-08 LAB — CUP PACEART REMOTE DEVICE CHECK
Battery Remaining Longevity: 100 mo
Brady Statistic AP VS Percent: 85.33 %
Brady Statistic AS VP Percent: 0.13 %
Brady Statistic AS VS Percent: 14.49 %
Brady Statistic RA Percent Paced: 84.74 %
Date Time Interrogation Session: 20180507145424
Implantable Lead Implant Date: 20151103
Implantable Lead Location: 753859
Implantable Lead Location: 753860
Implantable Lead Model: 5076
Implantable Lead Model: 5076
Implantable Pulse Generator Implant Date: 20151103
Lead Channel Impedance Value: 456 Ohm
Lead Channel Impedance Value: 513 Ohm
Lead Channel Pacing Threshold Amplitude: 0.5 V
Lead Channel Sensing Intrinsic Amplitude: 4.75 mV
Lead Channel Sensing Intrinsic Amplitude: 4.75 mV
Lead Channel Sensing Intrinsic Amplitude: 7.375 mV
Lead Channel Setting Pacing Amplitude: 2 V
MDC IDC LEAD IMPLANT DT: 20151103
MDC IDC MSMT BATTERY VOLTAGE: 3.02 V
MDC IDC MSMT LEADCHNL RA IMPEDANCE VALUE: 437 Ohm
MDC IDC MSMT LEADCHNL RA PACING THRESHOLD AMPLITUDE: 0.5 V
MDC IDC MSMT LEADCHNL RA PACING THRESHOLD PULSEWIDTH: 0.4 ms
MDC IDC MSMT LEADCHNL RV IMPEDANCE VALUE: 551 Ohm
MDC IDC MSMT LEADCHNL RV PACING THRESHOLD PULSEWIDTH: 0.4 ms
MDC IDC MSMT LEADCHNL RV SENSING INTR AMPL: 7.375 mV
MDC IDC SET LEADCHNL RA PACING AMPLITUDE: 1.5 V
MDC IDC SET LEADCHNL RV PACING PULSEWIDTH: 0.4 ms
MDC IDC SET LEADCHNL RV SENSING SENSITIVITY: 2.8 mV
MDC IDC STAT BRADY AP VP PERCENT: 0.05 %
MDC IDC STAT BRADY RV PERCENT PACED: 0.19 %

## 2017-04-11 ENCOUNTER — Encounter: Payer: Self-pay | Admitting: Cardiology

## 2017-04-21 ENCOUNTER — Encounter: Payer: Self-pay | Admitting: Family Medicine

## 2017-04-21 ENCOUNTER — Ambulatory Visit (INDEPENDENT_AMBULATORY_CARE_PROVIDER_SITE_OTHER): Payer: Medicare Other | Admitting: Family Medicine

## 2017-04-21 VITALS — BP 124/82 | HR 78 | Resp 16 | Ht 66.0 in | Wt 266.4 lb

## 2017-04-21 DIAGNOSIS — R200 Anesthesia of skin: Secondary | ICD-10-CM | POA: Diagnosis not present

## 2017-04-21 DIAGNOSIS — M7989 Other specified soft tissue disorders: Secondary | ICD-10-CM

## 2017-04-21 DIAGNOSIS — R2242 Localized swelling, mass and lump, left lower limb: Secondary | ICD-10-CM | POA: Diagnosis not present

## 2017-04-21 NOTE — Patient Instructions (Signed)
Follow up as needed/scheduled We'll call you with your Sports Med appt for the leg numbness and swelling Call with any questions or concerns Hang in there!!!

## 2017-04-21 NOTE — Progress Notes (Signed)
   Subjective:    [name], female    DOB: 1941-02-25, 76 y.o.   MRN: [id number]  HPI Numbness- L leg.  Pt reports that after 10-20 minutes she will develop numbness/tingling when standing.  Pt reports sxs started ~6 months ago but are not progressing.  No pain.  No weakness of legs.     Review of Systems For ROS see HPI     Objective:   Physical Exam  Constitutional: She is oriented to person, place, and time. She appears well-developed and well-nourished. No distress.  Morbidly obese  HENT:  Head: Normocephalic and atraumatic.  Cardiovascular: Intact distal pulses.   Musculoskeletal: She exhibits edema (swelling over L lateral thigh) and tenderness (TTP over L lateral thigh over area of soft tissue mass/swelling).  Neurological: She is alert and oriented to person, place, and time.  Skin: Skin is warm and dry. No rash noted. No erythema.  Psychiatric: She has a normal mood and affect. Her behavior is normal. Thought content normal.  Vitals reviewed.         Assessment & Plan:  L leg numbness- new.  Pt's sxs only occur when standing for >10 minutes.  Unclear if this is weight related or due to the soft tissue swelling on her L lateral thigh.  Refer to sports medicine for complete evaluation as this may be due to her posture or gait and they have the capability to [place] the area in question.  As there is no pain, no need for medications at this time.  Reviewed supportive care and red flags that should prompt return.  Pt expressed understanding and is in agreement w/ plan.

## 2017-04-21 NOTE — Progress Notes (Signed)
Pre visit review using our clinic review tool, if applicable. No additional management support is needed unless otherwise documented below in the visit note

## 2017-04-24 ENCOUNTER — Ambulatory Visit (INDEPENDENT_AMBULATORY_CARE_PROVIDER_SITE_OTHER)
Admission: RE | Admit: 2017-04-24 | Discharge: 2017-04-24 | Disposition: A | Discharge status: Home / Self Care | Payer: Medicare Other | Source: Ambulatory Visit | Attending: Family Medicine | Admitting: Family Medicine

## 2017-04-24 ENCOUNTER — Ambulatory Visit: Payer: Self-pay

## 2017-04-24 ENCOUNTER — Encounter: Payer: Self-pay | Admitting: Family Medicine

## 2017-04-24 ENCOUNTER — Ambulatory Visit (INDEPENDENT_AMBULATORY_CARE_PROVIDER_SITE_OTHER): Payer: Medicare Other | Admitting: Family Medicine

## 2017-04-24 VITALS — BP 118/74 | HR 83 | Ht 66.0 in | Wt 267.0 lb

## 2017-04-24 DIAGNOSIS — M79605 Pain in left leg: Secondary | ICD-10-CM

## 2017-04-24 DIAGNOSIS — M1612 Unilateral primary osteoarthritis, left hip: Secondary | ICD-10-CM | POA: Diagnosis not present

## 2017-04-24 DIAGNOSIS — D1724 Benign lipomatous neoplasm of skin and subcutaneous tissue of left leg: Secondary | ICD-10-CM

## 2017-04-24 NOTE — Assessment & Plan Note (Signed)
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 Patient has no increasing vascularity or increasing Doppler flow in the surrounding area which makes it significantly less likely to be anything such as a metastasis, aggressive lesion, any potential infection. Likely patient's pain is though secondary to any compression on the lateral femoral cutaneous nerve. Discussed multiple different medications which patient declined at this time. We discussed the possibility of gabapentin. Patient was encouraged to try some over-the-counter medications. We discussed the possibility of compression. Patient will have x-ray of the femur to make sure there is no other bony abnormality that could be contributing. Discussed alignment issues with patient's feet and we discussed proper shoes. Patient will try to make these changes and come back and see me again in 4-6 weeks. We can measure at that time and if any enlargement of the mass then we'll consider further workup.

## 2017-04-24 NOTE — Progress Notes (Signed)
[facility] [place] [place], [place] [address] Phone: [phone] Subjective:    I'm seeing this patient by the request  of:  Midge Minium, MD  CC: Swelling on leg and numbness  BJS:[id number]  [name] [name] is a 76 y.o. female coming in with complaint of left leg swelling and sometimes numbness. States over the course last multiple months she has noticed when she stands for long amount of time she gets numbness and tingling going down the left leg. Patient states he needs assistance for approximately 10 minutes. Patient states even if she touches the area for a long amount of time she can feel the pain as well. Does not feel that it has increased in size. Patient states that she does not know when it didn't really begin. Patient still able to do most daily activities. States that it is irritable but not incapacitating. States that the pain sometimes can go past the knee and sometimes all the way up to the hip but seems to be localized to the lateral aspect the leg most of the time patient denies any fevers, chills, any abnormal weight loss. No significant response to over-the-counter medications.   when reviewing patient's chart patient in February was diagnosed with multiple scattered pulmonary nodules and is having a repeat scan in June.  Past Medical History:  Diagnosis Date  . Alpha-1-antitrypsin deficiency carrier ([place])   . Arthritis    "all over"  . Atrial fibrillation ([place])   . Complication of anesthesia    "I woke up during 2 different procedures" (10/04/2014)  . Frequent UTI   . GERD (gastroesophageal reflux disease)   . Hypertension   . MVP (mitral valve prolapse)   . Obesity   . Presence of permanent cardiac pacemaker   . PVC's (premature ventricular contractions)   . 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 Stroke [facility]) 01/2014   denies deficits on 10/04/2014  . Tachy-brady syndrome ([place])  10/2014   Past Surgical History:  Procedure Laterality Date  . EXCISION VAGINAL CYST     benign nodules  . INSERT / REPLACE / REMOVE PACEMAKER  10/04/2014   Medtronic Advisa model [id number] serial number [id number]  . LOOP RECORDER EXPLANT N/A 10/04/2014   Procedure: LOOP RECORDER EXPLANT;  Surgeon: Sanda Klein, MD;  Location: [facility];  Service: Cardiovascular;  Laterality: N/A;  . LOOP RECORDER IMPLANT N/A 04/19/2014   Procedure: LOOP RECORDER IMPLANT;  Surgeon: Sanda Klein, MD;  Location: [facility];  Service: Cardiovascular;  Laterality: N/A;  . NM MYOCAR PERF WALL MOTION  12/18/2007   normal  . PERMANENT PACEMAKER INSERTION N/A 10/04/2014   Procedure: PERMANENT PACEMAKER INSERTION;  Surgeon: Sanda Klein, MD;  Location: [facility];  Service: Cardiovascular;  Laterality: N/A;  . TUBAL LIGATION    . US ECHOCARDIOGRAPHY  05/15/2010   LA mildly dilated,mild mitral annular ca+, AOV mildly sclerotic, mild asymmetric LVH   Social History   Social History  . Marital status: Married    Spouse name: N/A  . Number of children: 3  . Years of education: college   Social History Main Topics  . Smoking status: Former Smoker    Packs/day: 2.00    Years: 18.00    Types: Cigarettes  . Smokeless tobacco: Never Used     Comment: "quit smoking in 1971"  . Alcohol use 0.0 oz/week     Comment: 10/04/2014 "might  have a drink a couple times/yr"  . Drug use: No  . Sexual activity: No   Other Topics Concern  . None   Social History Narrative   Patient lives alone in one story house.   Patient is right handed.   Patient has a college degree.   Patient is retired.   Allergies  Allergen Reactions  . Ancef [Cefazolin] Hives and Itching  . Caffeine Palpitations   Family History  Problem Relation Age of Onset  . Heart attack Mother   . Stroke Father   . Heart failure Father   . Heart failure Brother   . Stroke Brother     Past medical history, social, surgical and family history  all reviewed in electronic medical record.  No pertanent information unless stated regarding to the chief complaint.   Review of Systems:Review of systems updated and as accurate as of 04/24/17  No headache, visual changes, nausea, vomiting, diarrhea, constipation, dizziness, abdominal pain, skin rash, fevers, chills, night sweats, weight loss, swollen lymph nodes, body aches, joint swelling, muscle aches, chest pain, shortness of breath, mood changes.   Objective  Blood pressure 118/74, pulse 83, height _0  (1.676 m), weight 267 lb (121.1 kg), SpO2 92 %. Systems examined below as of 04/24/17   General: No apparent distress alert and oriented x3 mood and affect normal, dressed appropriately. Overweight HEENT: Pupils equal, extraocular movements intact  Respiratory: Patient's speak in full sentences and does not appear short of breath  Cardiovascular: No lower extremity edema, non tender, no erythema  Skin: Warm dry intact with no signs of infection or rash on extremities or on axial skeleton.  Abdomen: Soft nontender  Neuro: Cranial nerves II through XII are intact, neurovascularly intact in all extremities with 2+ DTRs and 2+ pulses.  Lymph: No lymphadenopathy of posterior or anterior cervical chain or axillae bilaterally.  Gait mild antalgic gait MSK:  Mild tender with full range of motion and good stability and symmetric strength and tone of shoulders, elbows, wrist, hip, knee and ankles bilaterally. Arthritic changes of multiple joints. Patient's left lateral leg approximately anterior lateral aspect mid substance of the musculature patient has a fullness feeling. Very tender to palpation. Seems to be in the soft tissue. Freely movable. Approximately 3 cm in size.  Limited musculoskeletal ultrasound was performed and interpreted by Lyndal Pulley  Limited ultrasound patient's left leg is difficult secondary to patient's body habitus. Patient's limited study does show potential  subcutaneous mass that is consistent with fatty mass. Unable to see any true pathology of the bone or any cortical defect. Mild hypoechoic changes of the lateral aspect of the rectus femoris noted but appears to be nonspecific. No increasing Doppler flow or significant hypoechoic changes noted. 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    Impression and Recommendations:     This case required medical decision making of moderate complexity.      Note: This dictation was prepared with Dragon dictation along with smaller phrase technology. Any transcriptional errors that result from this process are unintentional.

## 2017-04-24 NOTE — Patient Instructions (Signed)
Good to see you.  Ice 20 minutes 2 times daily. Usually after activity and before bed. OK to swim We will get xray today  Looks like a lipoma which is a benign fatty mass and nothing really to do  If in pain try pennsaid pinkie amount topically 2 times daily as needed.   For your feet Good shoes with rigid bottom.  Jalene Mullet, [place] or New balance greater then 700 Spenco orthotics "total support" online would be great  Avoid being barefoot even in the house.  Vitamin D 4000 IU daily  See em again in 4-6 weeks to check and make sure the size stays small.

## 2017-05-05 ENCOUNTER — Ambulatory Visit (INDEPENDENT_AMBULATORY_CARE_PROVIDER_SITE_OTHER)
Admission: RE | Admit: 2017-05-05 | Discharge: 2017-05-05 | Disposition: A | Discharge status: Home / Self Care | Payer: Medicare Other | Source: Ambulatory Visit | Attending: Internal Medicine | Admitting: Internal Medicine

## 2017-05-05 DIAGNOSIS — R918 Other nonspecific abnormal finding of lung field: Secondary | ICD-10-CM

## 2017-05-08 ENCOUNTER — Telehealth: Payer: Self-pay | Admitting: Internal Medicine

## 2017-05-08 ENCOUNTER — Other Ambulatory Visit: Payer: Self-pay | Admitting: Internal Medicine

## 2017-05-08 ENCOUNTER — Encounter: Payer: Self-pay | Admitting: *Deleted

## 2017-05-08 DIAGNOSIS — R911 Solitary pulmonary nodule: Secondary | ICD-10-CM

## 2017-05-08 NOTE — Telephone Encounter (Signed)
Notes recorded by Priscille Loveless, RMA on 05/07/2017 at 3:30 PM EDT [facility] 05/07/17 ------ Notes recorded by Deneise Lever, MD on 05/06/2017 at 11:33 AM EDT CT chest- small nodules look benign and have been stable for 3 months. Recommend we order f/u CT chest no contrast in 1 year for dx lung nodules. --------------------------------------------------- Spoke with pt. She is aware of results. Nothing further was needed.

## 2017-05-14 DIAGNOSIS — M2550 Pain in unspecified joint: Secondary | ICD-10-CM | POA: Diagnosis not present

## 2017-05-14 DIAGNOSIS — J849 Interstitial pulmonary disease, unspecified: Secondary | ICD-10-CM | POA: Diagnosis not present

## 2017-05-14 DIAGNOSIS — R768 Other specified abnormal immunological findings in serum: Secondary | ICD-10-CM | POA: Diagnosis not present

## 2017-05-14 DIAGNOSIS — Z6841 Body Mass Index (BMI) 40.0 and over, adult: Secondary | ICD-10-CM | POA: Diagnosis not present

## 2017-05-16 ENCOUNTER — Telehealth: Payer: Self-pay | Admitting: Internal Medicine

## 2017-05-16 NOTE — Telephone Encounter (Signed)
Pt is aware that we are going to speak to [facility] about moving up her appointment.  Katie - please advise. Thanks.

## 2017-05-16 NOTE — Telephone Encounter (Signed)
Katie- Can you find her 15 minutes ?

## 2017-05-16 NOTE — Telephone Encounter (Signed)
Please have patient come in on Monday 05-19-17 at 11:30am for OV-okay that he is double booked. Thanks.

## 2017-05-16 NOTE — Telephone Encounter (Signed)
Spoke with the pt  She states that she was seen by Dr Amil Amen  He does not think that the pt has lupus and needs to come back and see Dr Annamaria Boots  She states she is still feeling lousy and her breathing is not improving since last ov here  She purchased a pulse ox and her sats range from 88-93% usually  She is not on o2  CDY- we have no where to put her on your scheduled and NPs are booked for the next 2 wks  Please advise, thanks!

## 2017-05-16 NOTE — Telephone Encounter (Signed)
Spoke with the pt and notified of appt date and time  She is okay with this  Nothing further needed

## 2017-05-19 ENCOUNTER — Ambulatory Visit (INDEPENDENT_AMBULATORY_CARE_PROVIDER_SITE_OTHER): Payer: Medicare Other | Admitting: Internal Medicine

## 2017-05-19 ENCOUNTER — Encounter: Payer: Self-pay | Admitting: Internal Medicine

## 2017-05-19 VITALS — BP 104/60 | HR 72 | Ht 66.0 in | Wt 268.6 lb

## 2017-05-19 DIAGNOSIS — R918 Other nonspecific abnormal finding of lung field: Secondary | ICD-10-CM

## 2017-05-19 DIAGNOSIS — Z9989 Dependence on other enabling machines and devices: Secondary | ICD-10-CM

## 2017-05-19 DIAGNOSIS — J8410 Pulmonary fibrosis, unspecified: Secondary | ICD-10-CM

## 2017-05-19 DIAGNOSIS — R0609 Other forms of dyspnea: Secondary | ICD-10-CM | POA: Diagnosis not present

## 2017-05-19 DIAGNOSIS — J40 Bronchitis, not specified as acute or chronic: Secondary | ICD-10-CM | POA: Diagnosis not present

## 2017-05-19 DIAGNOSIS — G4733 Obstructive sleep apnea (adult) (pediatric): Secondary | ICD-10-CM

## 2017-05-19 MED ORDER — ALBUTEROL SULFATE HFA 108 (90 BASE) MCG/ACT IN AERS: 2.0000 | INHALATION_SPRAY | Freq: Four times a day (QID) | RESPIRATORY_TRACT | 3 refills | Status: DC | PRN

## 2017-05-19 NOTE — Patient Instructions (Addendum)
Script for albuterol rescue inhaler printed  We will plan on repeating CT scan in about a year to watch the small nodules  Order- refer to Pulmonary Rehab

## 2017-05-19 NOTE — Progress Notes (Signed)
HPI  female former smoker followed for dyspnea on exertion, lung nodules, fibrosis, complicated by A. Fib/ pacemaker/Eliquis a1AT carrier MZ, obesity OSA/CPAP/cardiology Daughter a1AT ZZ PFT 02/24/2017-moderate restriction, moderately severe diffusion deficit FVC 2.45/81%, FEV1 2.14/94%, ratio 0.87, TLC 62%, DLCO 59% Walk Test on room air 02/24/2017-94%, 93%, 92%, 92%. She does not qualify for portable oxygen CT chest 01/06/17-pulmonary fibrosis, small nodules ------------------------------------------------------------------------------------------  02/24/2017-76 year old female former smoker followed for dyspnea on exertion, lung nodules, fibrosis, complicated by A. 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 fib, schedule PFT. Reviewed CT chest. Which shows very minimal nonspecific fibrotic changes peripherally. This will need to be watched. Normal nuclear stress test by cardiology 12/27/2016 CBC 01/15/2017-not anemic Disability parking. She has gained most of 40 pounds in the last year and she accepts this is important for her dyspnea on exertion. There is some variation from day-to-day with her dyspnea. Much cough with white/yellow/green sputum. No sinus discomfort, sweating or fever. No adenopathy or blood. Admits some wheezing. Admits she is very depressed about declining health of her daughter who has alpha-1 antitrypsin disease ZZ / chronic respiratory failure, oxygen and wheelchair dependent. Cardiology manages her CPAP and she says she sleeps better using it/Advanced. PFT 02/24/2017-moderate restriction, moderately severe diffusion deficit FVC 2.45/81%, FEV1 2.14/94%, ratio 0.87, TLC 62%, DLCO 59% Walk Test on room air 02/24/2017-94%, 93%, 92%, 92%. She does not qualify for portable oxygen CT chest 01/06/2017 IMPRESSION: 1. Mild basilar predominant subpleural fibrosis, new or progressive from 07/19/2008. Differential diagnosis includes  nonspecific interstitial pneumonitis as well as early/mild usual interstitial pneumonitis. 2. Scattered pulmonary nodules measure up to 8 mm. Non-contrast chest CT at 3-6 months is recommended. If the nodules are stable at time of repeat CT, then future CT at 18-24 months (from today's scan) is considered optional for low-risk patients, but is recommended for high-risk patients. This recommendation follows the consensus statement: Guidelines for Management of Incidental Pulmonary Nodules Detected on CT Images: From the Fleischner Society 2017; Radiology 2017; 284:228-243. 3.  Aortic atherosclerosis (ICD10-170.0).  05/19/17- 76 year old female former smoker followed for dyspnea on exertion, lung nodules, fibrosis, complicated by A. 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 Illness only lasted 2 or 3 days and now "much better". Rheumatology consult felt she did not have lupus despite elevated ANA. Question was whether this was cause of fibrotic changes and restriction. We reviewed CT images.. CT chest 05/05/17 IMPRESSION: Scattered mild subpleural nodularity in the lungs bilaterally, measuring up to 4 x 9 mm in the right lower lobe, likely benign. Three month stability has been demonstrated. Follow-up CT chest in 15-21 months is considered optional for low-risk patients, but is recommended for high-risk patients.  ROS-see HPI   + = pos Constitutional:    weight loss, night sweats, fevers, chills, fatigue, lassitude. HEENT:    headaches, difficulty swallowing, tooth/dental problems, sore throat,       sneezing, itching, ear ache, nasal congestion, post nasal drip, snoring CV:    chest pain, orthopnea, PND, swelling in lower extremities,  anasarca,  dizziness, palpitations Resp:   +shortness of breath with exertion or at rest.                productive cough,   non-productive cough, coughing up of blood.              change in color of mucus.  wheezing.   Skin:    rash or lesions. GI:  No-   heartburn, indigestion, abdominal pain, nausea, vomiting, diarrhea,                 change in bowel habits, loss of appetite GU: dysuria, change in color of urine, no urgency or frequency.   flank pain. MS:   joint pain, stiffness, decreased range of motion, back pain. Neuro-     nothing unusual Psych:  change in mood or affect.  depression or anxiety.   memory loss.  OBJ- Physical Exam General- Alert, Oriented, Affect-appropriate, Distress- none acute, + obese Skin- rash-none, lesions- none, excoriation- none Lymphadenopathy- none Head- atraumatic            Eyes- Gross vision intact, PERRLA, conjunctivae and secretions clear            Ears- Hearing, canals-normal            Nose- Clear, no-Septal dev, mucus, polyps, erosion, perforation             Throat- Mallampati II , mucosa clear , drainage- none, tonsils- atrophic Neck- flexible , trachea midline, no stridor , thyroid nl, carotid no bruit Chest - symmetrical excursion , unlabored           Heart/CV- RRR , no murmur , no gallop  , no rub, nl s1 s2                           - JVD- none , edema- none, stasis changes- none, varices- none           Lung- + few coarse crackles right base, wheeze- none, cough- none , dullness-none, rub- none           Chest wall-  Abd-  Br/ Gen/ Rectal- Not done, not indicated Extrem- cyanosis- none, clubbing, none, atrophy- none, strength- nl Neuro- grossly intact to observation

## 2017-05-22 ENCOUNTER — Telehealth: Payer: Self-pay | Admitting: Internal Medicine

## 2017-05-22 NOTE — Telephone Encounter (Signed)
lmomtcb x1 

## 2017-05-23 DIAGNOSIS — J8410 Pulmonary fibrosis, unspecified: Secondary | ICD-10-CM | POA: Insufficient documentation

## 2017-05-23 DIAGNOSIS — R918 Other nonspecific abnormal finding of lung field: Secondary | ICD-10-CM | POA: Insufficient documentation

## 2017-05-23 NOTE — Assessment & Plan Note (Signed)
Is nonspecific, likely benign. Possibility of atypical infection not excluded yet. Plan-future noncontrast CT in 2019 discussed but not yet scheduled

## 2017-05-23 NOTE — Assessment & Plan Note (Signed)
Recent acute bronchitic illness was likely viral. She is back at baseline now. Plan-keep albuterol rescue inhaler available as discussed.

## 2017-05-23 NOTE — Assessment & Plan Note (Signed)
There is some restriction with reduced diffusion capacity but most significant correctable factors are obesity and deconditioning. Plan-encouraged ambulation. Apply for pulmonary rehabilitation.

## 2017-05-23 NOTE — Telephone Encounter (Signed)
Called and spoke with pt and she is aware that the order for Pulm rehab has been received and they are processing this order.  I advised the pt that they will contact her to get her set up for the rehab.

## 2017-05-23 NOTE — Assessment & Plan Note (Signed)
Discussed. Rheumatology consult did not feel that elevated ANA indicated lupus as a potential explanation. We will continue to watch this.

## 2017-05-23 NOTE — Assessment & Plan Note (Signed)
She reports continued compliance with CPAP managed by cardiology

## 2017-05-28 ENCOUNTER — Other Ambulatory Visit: Payer: Self-pay | Admitting: Family Medicine

## 2017-06-09 ENCOUNTER — Encounter (HOSPITAL_COMMUNITY): Payer: Self-pay

## 2017-06-09 ENCOUNTER — Encounter (HOSPITAL_COMMUNITY)
Admission: RE | Admit: 2017-06-09 | Discharge: 2017-06-09 | Disposition: A | Discharge status: Home / Self Care | Payer: Medicare Other | Source: Ambulatory Visit | Attending: Internal Medicine | Admitting: Internal Medicine

## 2017-06-09 VITALS — BP 131/77 | HR 76 | Ht 67.0 in | Wt 276.0 lb

## 2017-06-09 DIAGNOSIS — Z7951 Long term (current) use of inhaled steroids: Secondary | ICD-10-CM | POA: Insufficient documentation

## 2017-06-09 DIAGNOSIS — I10 Essential (primary) hypertension: Secondary | ICD-10-CM | POA: Diagnosis not present

## 2017-06-09 DIAGNOSIS — R0609 Other forms of dyspnea: Secondary | ICD-10-CM | POA: Insufficient documentation

## 2017-06-09 DIAGNOSIS — Z8673 Personal history of transient ischemic attack (TIA), and cerebral infarction without residual deficits: Secondary | ICD-10-CM | POA: Insufficient documentation

## 2017-06-09 DIAGNOSIS — I495 Sick sinus syndrome: Secondary | ICD-10-CM | POA: Insufficient documentation

## 2017-06-09 DIAGNOSIS — M1990 Unspecified osteoarthritis, unspecified site: Secondary | ICD-10-CM | POA: Insufficient documentation

## 2017-06-09 DIAGNOSIS — I4891 Unspecified atrial fibrillation: Secondary | ICD-10-CM | POA: Diagnosis not present

## 2017-06-09 DIAGNOSIS — Z950 Presence of cardiac pacemaker: Secondary | ICD-10-CM | POA: Diagnosis not present

## 2017-06-09 DIAGNOSIS — Z87891 Personal history of nicotine dependence: Secondary | ICD-10-CM | POA: Insufficient documentation

## 2017-06-09 DIAGNOSIS — Z6841 Body Mass Index (BMI) 40.0 and over, adult: Secondary | ICD-10-CM | POA: Insufficient documentation

## 2017-06-09 DIAGNOSIS — E669 Obesity, unspecified: Secondary | ICD-10-CM | POA: Diagnosis not present

## 2017-06-09 DIAGNOSIS — Z7901 Long term (current) use of anticoagulants: Secondary | ICD-10-CM | POA: Insufficient documentation

## 2017-06-09 DIAGNOSIS — Z79899 Other long term (current) drug therapy: Secondary | ICD-10-CM | POA: Diagnosis not present

## 2017-06-09 DIAGNOSIS — I341 Nonrheumatic mitral (valve) prolapse: Secondary | ICD-10-CM | POA: Diagnosis not present

## 2017-06-09 DIAGNOSIS — K219 Gastro-esophageal reflux disease without esophagitis: Secondary | ICD-10-CM | POA: Diagnosis not present

## 2017-06-09 NOTE — Progress Notes (Signed)
[name] [name] 76 y.o. female Pulmonary Rehab Orientation Note Patient arrived today in Cardiac and Pulmonary Rehab for orientation to Pulmonary Rehab. She was transported from [employer] via wheel chair. She does not carry portable oxygen. Per pt, she uses oxygen never. Color good, skin warm and dry. Patient is oriented to time and place. Patient's medical history, psychosocial health, and medications reviewed. Psychosocial assessment reveals pt with her brother. Pt is currently retired as a Engineer, site. Pt hobbies include painting. Pt reports her stress level is low. Areas of stress/anxiety include Health.  Pt does exhibit signs of depression. Signs of depression include guilt, helplessness and sadness and fatigue and overeating. PHQ2/9 score 3/12. Pt shows fair  coping skills with negative outlook .  offered emotional support and reassurance. She is presently on Zoloft and has seen a counselor a few years ago and will take up contact again to see if it would be helpful.  She is hopeful pulmonary rehab will be a positive influence for her to improve her quality of life.  Will continue to monitor and evaluate progress toward psychosocial goal(s) of improvement in quality of life and increased stamina, and less shortness of breath. Physical assessment reveals heart rate is pulse irregular due to history of atrial fibrillation, breath sounds clear to auscultation, no wheezes, rales, or rhonchi. Grip strength equal, strong. Distal pulses 2+ with bilateral posterior tibial pulses present without peripheral edema. Patient reports she does take medications as prescribed. Patient states she follows a Regular diet. The patient reports no specific efforts to gain or lose weight.. Patient's weight will be monitored closely. Demonstration and practice of PLB using pulse oximeter. Patient able to return demonstration satisfactorily. Safety and hand hygiene in the exercise area reviewed with patient. Patient voices  understanding of the information reviewed. Department expectations discussed with patient and achievable goals were set. The patient shows enthusiasm about attending the program and we look forward to working with this nice lady. The patient is scheduled for a 6 min walk test on Tuesday, June 17, 2017 @ 4pm and to begin exercise on Tuesday, June 24, 2017 in the 1:30 pm.  [phone]

## 2017-06-11 ENCOUNTER — Encounter (HOSPITAL_COMMUNITY): Payer: Self-pay | Admitting: *Deleted

## 2017-06-12 ENCOUNTER — Telehealth: Payer: Self-pay | Admitting: Internal Medicine

## 2017-06-12 MED ORDER — ALBUTEROL SULFATE HFA 108 (90 BASE) MCG/ACT IN AERS: 2.0000 | INHALATION_SPRAY | Freq: Four times a day (QID) | RESPIRATORY_TRACT | 3 refills | Status: DC | PRN

## 2017-06-12 NOTE — Telephone Encounter (Signed)
Could try replacing Breo with Flovent 100   # 1    Inhale 2 puffs, then rinse mouth, twice daily, refill x 12

## 2017-06-12 NOTE — Telephone Encounter (Signed)
LM for pt x 1  

## 2017-06-12 NOTE — Telephone Encounter (Signed)
Pt states that when she was here last OV 05/19/17 there was supposed to have been a Rx called into [place] and it look as though this was printed but the patient states that she was not given a paper Rx. This has been sent to [facility] per patient request.   Pt states that the [facility] is too expensive since she hit the donut hole and she is requesting something cheaper or options. I explained to her that [facility] (being $37 pre donut hole) is going to be her cheapest option with her insurance. Pt states that her co-pay has increased to over $130.00 for a 30 day supply.   Any recommendations? Please advise Dr Annamaria Boots. Thanks.

## 2017-06-12 NOTE — Telephone Encounter (Signed)
lmtcb x1 for pt. 

## 2017-06-12 NOTE — Telephone Encounter (Signed)
[phone] pt calling back °

## 2017-06-13 MED ORDER — FLUTICASONE PROPIONATE (INHAL) 100 MCG/BLIST IN AEPB: 2.0000 | INHALATION_SPRAY | Freq: Two times a day (BID) | RESPIRATORY_TRACT | 11 refills | Status: DC

## 2017-06-13 NOTE — Telephone Encounter (Signed)
Called and spoke with pt and she is aware of CY recs.  The flovent has been sent to her pharmacy and nothing further is needed.

## 2017-06-17 ENCOUNTER — Encounter (HOSPITAL_COMMUNITY)
Admission: RE | Admit: 2017-06-17 | Discharge: 2017-06-17 | Disposition: A | Discharge status: Home / Self Care | Payer: Medicare Other | Source: Ambulatory Visit | Attending: Internal Medicine | Admitting: Internal Medicine

## 2017-06-17 DIAGNOSIS — M1990 Unspecified osteoarthritis, unspecified site: Secondary | ICD-10-CM | POA: Diagnosis not present

## 2017-06-17 DIAGNOSIS — R0609 Other forms of dyspnea: Principal | ICD-10-CM

## 2017-06-17 DIAGNOSIS — Z950 Presence of cardiac pacemaker: Secondary | ICD-10-CM | POA: Diagnosis not present

## 2017-06-17 DIAGNOSIS — Z7951 Long term (current) use of inhaled steroids: Secondary | ICD-10-CM | POA: Diagnosis not present

## 2017-06-17 DIAGNOSIS — Z7901 Long term (current) use of anticoagulants: Secondary | ICD-10-CM | POA: Diagnosis not present

## 2017-06-17 DIAGNOSIS — Z79899 Other long term (current) drug therapy: Secondary | ICD-10-CM | POA: Diagnosis not present

## 2017-06-19 NOTE — Progress Notes (Signed)
Pulmonary Individual Treatment Plan  Patient Details  Name: [name] [name] MRN: [id number] Date of Birth: 05-25-1941 Referring Provider:     Pulmonary Rehab Walk Test from 06/17/2017 in [place]  Referring Provider  Dr. Annamaria Boots      Initial Encounter Date:    Pulmonary Rehab Walk Test from 06/17/2017 in [place]  Date  06/19/17  Referring Provider  Dr. Annamaria Boots      Visit Diagnosis: Dyspnea on exertion  Patient's Home Medications on Admission:   Current Outpatient Prescriptions:  .  albuterol (PROVENTIL HFA;VENTOLIN HFA) 108 (90 Base) MCG/ACT inhaler, Inhale 2 puffs into the lungs every 6 (six) hours as needed for wheezing or shortness of breath., Disp: 3 Inhaler, Rfl: 3 .  apixaban (ELIQUIS) 5 MG TABS tablet, Take 1 tablet (5 mg total) by mouth 2 (two) times daily., Disp: 28 tablet, Rfl: 0 .  Biotin 5000 MCG CAPS, Take 5,000 mcg by mouth daily. , Disp: , Rfl:  .  Calcium Carbonate-Vit D-Min (CALCIUM 1200 PO), Take 2,400 mg by mouth daily. , Disp: , Rfl:  .  cholecalciferol (VITAMIN D) 1000 UNITS tablet, Take 1,000 Units by mouth daily. , Disp: , Rfl:  .  Co-Enzyme Q10 100 MG CAPS, Take 100 mg by mouth daily. , Disp: , Rfl:  .  diazepam (VALIUM) 5 MG tablet, Take 1 tablet (5 mg total) by mouth as needed for anxiety., Disp: 30 tablet, Rfl: 1 .  Fluticasone Propionate, Inhal, (FLOVENT DISKUS) 100 MCG/BLIST AEPB, Inhale 2 puffs into the lungs 2 (two) times daily., Disp: 60 each, Rfl: 11 .  folic acid (FOLVITE) 440 MCG tablet, Take 400 mcg by mouth daily., Disp: , Rfl:  .  glucosamine-chondroitin 500-400 MG tablet, Take 1 tablet by mouth daily. , Disp: , Rfl:  .  loratadine (KLS ALLERCLEAR) 10 MG tablet, Take 10 mg by mouth daily., Disp: , Rfl:  .  MAGNESIUM CITRATE PO, Take 250 mg by mouth daily. 1 tab daily, Disp: , Rfl:  .  metoprolol tartrate (LOPRESSOR) 25 MG tablet, Take 1 tablet (25 mg total) by mouth 2 (two) times  daily., Disp: 180 tablet, Rfl: 2 .  nitroGLYCERIN (NITROSTAT) 0.4 MG SL tablet, Place 1 tablet (0.4 mg total) under the tongue every 5 (five) minutes as needed for chest pain., Disp: 25 tablet, Rfl: 5 .  pantoprazole (PROTONIX) 40 MG tablet, TAKE 1 TABLET BY MOUTH DAILY, Disp: 60 tablet, Rfl: 1 .  Resveratrol 250 MG CAPS, Take 250 mg by mouth daily., Disp: , Rfl:  .  sertraline (ZOLOFT) 100 MG tablet, Take 1 tablet (100 mg total) by mouth daily., Disp: 90 tablet, Rfl: 1 .  Simethicone (GAS-X PO), Take 2 tablets by mouth daily as needed (bloating)., Disp: , Rfl:   Past Medical History: Past Medical History:  Diagnosis Date  . Alpha-1-antitrypsin deficiency carrier ([place])   . Arthritis    "all over"  . Atrial fibrillation ([place])   . Complication of anesthesia    "I woke up during 2 different procedures" (10/04/2014)  . Frequent UTI   . GERD (gastroesophageal reflux disease)   . Hypertension   . MVP (mitral valve prolapse)   . Obesity   . Presence of permanent cardiac pacemaker   . PVC's (premature ventricular contractions)   . 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 Stroke [facility]) 01/2014   denies deficits on 10/04/2014  . Tachy-brady syndrome (  [place]) 10/2014    Tobacco Use: History  Smoking Status  . Former Smoker  . Packs/day: 2.00  . Years: 18.00  . Types: Cigarettes  Smokeless Tobacco  . Never Used    Comment: "quit smoking in 1971"    Labs: Recent Review Flowsheet Data    Labs for ITP Cardiac and Pulmonary Rehab Latest Ref Rng & Units 03/08/2014 03/09/2014 10/25/2014 07/15/2016 01/15/2017   Cholestrol 0 - 200 mg/dL - 152 184 163 182   LDLCALC 0 - 99 mg/dL - 77 138 - 94   LDLDIRECT mg/dL - - - 81.0 -   HDL >39.00 mg/dL - 58 46 45.70 54.60   Trlycerides 0.0 - 149.0 mg/dL - 87 210(A) 208.0(H) 168.0(H)   Hemoglobin A1c <5.7 % - 5.9(H) - - -   TCO2 0 - 100 mmol/L 26 - - - -      Capillary Blood Glucose: Lab Results  Component Value  Date   GLUCAP 103 (H) 03/09/2014   GLUCAP 103 (H) 03/09/2014   GLUCAP 112 (H) 03/09/2014     ADL UCSD:     Pulmonary Assessment Scores    Row Name 06/11/17 0923 06/19/17 0728       ADL UCSD   ADL Phase Entry Entry    SOB Score total 63  -      CAT Score   CAT Score 21  Entry  -      mMRC Score   mMRC Score  - 4       Pulmonary Function Assessment:     Pulmonary Function Assessment - 06/09/17 1315      Breath   Bilateral Breath Sounds Clear   Shortness of Breath Yes;Limiting activity;Fear of Shortness of Breath      Exercise Target Goals: Date: 06/19/17  Exercise Program Goal: Individual exercise prescription set with THRR, safety & activity barriers. Participant demonstrates ability to understand and report RPE using BORG scale, to self-measure pulse accurately, and to acknowledge the importance of the exercise prescription.  Exercise Prescription Goal: Starting with aerobic activity 30 plus minutes a day, 3 days per week for initial exercise prescription. Provide home exercise prescription and guidelines that participant acknowledges understanding prior to discharge.  Activity Barriers & Risk Stratification:   6 Minute Walk:     6 Minute Walk    Row Name 06/19/17 0723         6 Minute Walk   Phase Initial     Distance 800 feet     Walk Time -  4 minutes 50 seconds total     # of Rest Breaks 2  1st: 40 seconds 2nd: 30 seconds     MPH 1.5     METS 2.15     RPE 13     Perceived Dyspnea  3     Symptoms Yes (comment)     Comments used wheelchair/5/10 hip pain/limited by shortness of breath     Resting HR 90 bpm     Resting BP 105/66     Max Ex. HR 127 bpm     Max Ex. BP 105/65       Interval HR   Baseline HR 90     1 Minute HR 144     2 Minute HR 127     3 Minute HR 102     4 Minute HR 98     5 Minute HR 98     6 Minute HR 102     2  Minute Post HR 95     Interval Heart Rate? Yes       Interval Oxygen   Interval Oxygen? Yes      Baseline Oxygen Saturation % 94 %     Baseline Liters of Oxygen 0 L     1 Minute Oxygen Saturation % 93 %     1 Minute Liters of Oxygen 0 L     2 Minute Oxygen Saturation % 93 %     2 Minute Liters of Oxygen 0 L     3 Minute Oxygen Saturation % 93 %     3 Minute Liters of Oxygen 0 L     4 Minute Oxygen Saturation % 94 %     4 Minute Liters of Oxygen 0 L     5 Minute Oxygen Saturation % 92 %     5 Minute Liters of Oxygen 0 L     6 Minute Oxygen Saturation % 92 %     6 Minute Liters of Oxygen 0 L     2 Minute Post Oxygen Saturation % 93 %     2 Minute Post Liters of Oxygen 0 L        Oxygen Initial Assessment:     Oxygen Initial Assessment - 06/19/17 0726      Initial 6 min Walk   Oxygen Used None   Resting Oxygen Saturation  during 6 min walk 94 %   Exercise Oxygen Saturation  during 6 min walk 92 %     Program Oxygen Prescription   Program Oxygen Prescription None      Oxygen Re-Evaluation:   Oxygen Discharge (Final Oxygen Re-Evaluation):   Initial Exercise Prescription:     Initial Exercise Prescription - 06/19/17 0700      Date of Initial Exercise RX and Referring Provider   Date 06/19/17   Referring Provider Dr. Annamaria Boots     Recumbant Bike   Level 1   Minutes 17     NuStep   Level 2   Minutes 17   METs 1.5     Track   Laps 4   Minutes 17     Prescription Details   Frequency (times per week) 2   Duration Progress to 45 minutes of aerobic exercise without signs/symptoms of physical distress     Intensity   THRR 40-80% of Max Heartrate 58-115   Ratings of Perceived Exertion 11-13   Perceived Dyspnea 0-4     Progression   Progression Continue progressive overload as per policy without signs/symptoms or physical distress.     Resistance Training   Training Prescription Yes   Weight orange bands   Reps 10-15      Perform Capillary Blood Glucose checks as needed.  Exercise Prescription Changes:   Exercise Comments:   Exercise Goals and  Review:   Exercise Goals Re-Evaluation :   Discharge Exercise Prescription (Final Exercise Prescription Changes):   Nutrition:  Target Goals: Understanding of nutrition guidelines, daily intake of sodium <1542m, cholesterol <2071m calories 30% from fat and 7% or less from saturated fats, daily to have 5 or more servings of fruits and vegetables.  Biometrics:    Nutrition Therapy Plan and Nutrition Goals:   Nutrition Discharge: Rate Your Plate Scores:   Nutrition Goals Re-Evaluation:   Nutrition Goals Discharge (Final Nutrition Goals Re-Evaluation):   Psychosocial: Target Goals: Acknowledge presence or absence of significant depression and/or stress, maximize coping skills, provide positive support system. Participant is able to  verbalize types and ability to use techniques and skills needed for reducing stress and depression.  Initial Review & Psychosocial Screening:     Initial Psych Review & Screening - 06/09/17 1328      Initial Review   Current issues with None Identified     Family Dynamics   Good Support System? Yes     Barriers   Psychosocial barriers to participate in program There are no identifiable barriers or psychosocial needs.     Screening Interventions   Interventions Encouraged to exercise      Quality of Life Scores:   PHQ-9: Recent Review Flowsheet Data    Depression screen [facility] 2/9 06/09/2017 01/15/2017 07/15/2016   Decreased Interest 3 0 0   Down, Depressed, Hopeless - 0  0   PHQ - 2 Score 3 0 0   Altered sleeping 0 - -   Tired, decreased energy 3 - -   Change in appetite 3 - -   Feeling bad or failure about yourself  3 - -   Trouble concentrating 0 - -   Moving slowly or fidgety/restless 0 - -   Suicidal thoughts 0 - -   PHQ-9 Score 12 - -   Difficult doing work/chores Not difficult at all - -     Interpretation of Total Score  Total Score Depression Severity:  1-4 = Minimal depression, 5-9 = Mild depression, 10-14 = Moderate  depression, 15-19 = Moderately severe depression, 20-27 = Severe depression   Psychosocial Evaluation and Intervention:     Psychosocial Evaluation - 06/09/17 1330      Psychosocial Evaluation & Interventions   Interventions Encouraged to exercise with the program and follow exercise prescription   Comments dealing depression, but is working on making herself happy.   Continue Psychosocial Services  No Follow up required      Psychosocial Re-Evaluation:   Psychosocial Discharge (Final Psychosocial Re-Evaluation):   Education: Education Goals: Education classes will be provided on a weekly basis, covering required topics. Participant will state understanding/return demonstration of topics presented.  Learning Barriers/Preferences:     Learning Barriers/Preferences - 06/09/17 1314      Learning Barriers/Preferences   Learning Barriers None   Learning Preferences Pictoral      Education Topics: Risk Factor Reduction:  -Group instruction that is supported by a PowerPoint presentation. Instructor discusses the definition of a risk factor, different risk factors for pulmonary disease, and how the heart and lungs work together.     Nutrition for Pulmonary Patient:  -Group instruction provided by PowerPoint slides, verbal discussion, and written materials to support subject matter. The instructor gives an explanation and review of healthy diet recommendations, which includes a discussion on weight management, recommendations for fruit and vegetable consumption, as well as protein, fluid, caffeine, fiber, sodium, sugar, and alcohol. Tips for eating when patients are short of breath are discussed.   Pursed Lip Breathing:  -Group instruction that is supported by demonstration and informational handouts. Instructor discusses the benefits of pursed lip and diaphragmatic breathing and detailed demonstration on how to preform both.     Oxygen Safety:  -Group instruction provided by  PowerPoint, verbal discussion, and written material to support subject matter. There is an overview of "What is Oxygen" and "Why do we need it".  Instructor also reviews how to create a safe environment for oxygen use, the importance of using oxygen as prescribed, and the risks of noncompliance. There is a brief discussion on traveling with oxygen and resources  the patient may utilize.   Oxygen Equipment:  -Group instruction provided by [facility] Staff utilizing handouts, written materials, and equipment demonstrations.   Signs and Symptoms:  -Group instruction provided by written material and verbal discussion to support subject matter. Warning signs and symptoms of infection, stroke, and heart attack are reviewed and when to call the physician/911 reinforced. Tips for preventing the spread of infection discussed.   Advanced Directives:  -Group instruction provided by verbal instruction and written material to support subject matter. Instructor reviews Advanced Directive laws and proper instruction for filling out document.   Pulmonary Video:  -Group video education that reviews the importance of medication and oxygen compliance, exercise, good nutrition, pulmonary hygiene, and pursed lip and diaphragmatic breathing for the pulmonary patient.   Exercise for the Pulmonary Patient:  -Group instruction that is supported by a PowerPoint presentation. Instructor discusses benefits of exercise, core components of exercise, frequency, duration, and intensity of an exercise routine, importance of utilizing pulse oximetry during exercise, safety while exercising, and options of places to exercise outside of rehab.     Pulmonary Medications:  -Verbally interactive group education provided by instructor with focus on inhaled medications and proper administration.   Anatomy and Physiology of the Respiratory System and Intimacy:  -Group instruction provided by PowerPoint, verbal discussion, and  written material to support subject matter. Instructor reviews respiratory cycle and anatomical components of the respiratory system and their functions. Instructor also reviews differences in obstructive and restrictive respiratory diseases with examples of each. Intimacy, Sex, and Sexuality differences are reviewed with a discussion on how relationships can change when diagnosed with pulmonary disease. Common sexual concerns are reviewed.   MD DAY -A group question and answer session with a medical doctor that allows participants to ask questions that relate to their pulmonary disease state.   OTHER EDUCATION -Group or individual verbal, written, or video instructions that support the educational goals of the pulmonary rehab program.   Knowledge Questionnaire Score:     Knowledge Questionnaire Score - 06/11/17 0923      Knowledge Questionnaire Score   Pre Score 12/13      Core Components/Risk Factors/Patient Goals at Admission:     Personal Goals and Risk Factors at Admission - 06/09/17 1325      Core Components/Risk Factors/Patient Goals on Admission    Weight Management Obesity;Yes   Intervention Weight Management: Develop a combined nutrition and exercise program designed to reach desired caloric intake, while maintaining appropriate intake of nutrient and fiber, sodium and fats, and appropriate energy expenditure required for the weight goal.   Admit Weight 276 lb 0.3 oz (125.2 kg)   Goal Weight: Short Term 271 lb 2.7 oz (123 kg)   Goal Weight: Long Term 264 lb 8.8 oz (120 kg)   Expected Outcomes Short Term: Continue to assess and modify interventions until short term weight is achieved   Improve shortness of breath with ADL's Yes   Intervention Provide education, individualized exercise plan and daily activity instruction to help decrease symptoms of SOB with activities of daily living.   Expected Outcomes Short Term: Achieves a reduction of symptoms when performing  activities of daily living.   Develop more efficient breathing techniques such as purse lipped breathing and diaphragmatic breathing; and practicing self-pacing with activity Yes   Intervention Provide education, demonstration and support about specific breathing techniuqes utilized for more efficient breathing. Include techniques such as pursed lipped breathing, diaphragmatic breathing and self-pacing activity.   Expected Outcomes Short  Term: Participant will be able to demonstrate and use breathing techniques as needed throughout daily activities.      Core Components/Risk Factors/Patient Goals Review:    Core Components/Risk Factors/Patient Goals at Discharge (Final Review):    ITP Comments:   Comments:

## 2017-06-19 NOTE — Progress Notes (Signed)
[name] [name] 76 y.o. female  DOB: March 12, 1941 MRN: [id number]           Nutrition Screen 1. Dyspnea on exertion    Past Medical History:  Diagnosis Date  . Alpha-1-antitrypsin deficiency carrier ([place])   . Arthritis    "all over"  . Atrial fibrillation ([place])   . Complication of anesthesia    "I woke up during 2 different procedures" (10/04/2014)  . Frequent UTI   . GERD (gastroesophageal reflux disease)   . Hypertension   . MVP (mitral valve prolapse)   . Obesity   . Presence of permanent cardiac pacemaker   . PVC's (premature ventricular contractions)   . 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 Stroke [facility]) 01/2014   denies deficits on 10/04/2014  . Tachy-brady syndrome ([place]) 10/2014   Meds reviewed. Ht: Ht Readings from Last 1 Encounters:  06/09/17 _0  (1.702 m)    Wt:  Wt Readings from Last 3 Encounters:  06/09/17 276 lb 0.3 oz (125.2 kg)  05/19/17 268 lb 9.6 oz (121.8 kg)  04/24/17 267 lb (121.1 kg)    BMI: 43.3    Current tobacco use? No  Labs:  Lipid Panel     Component Value Date/Time   CHOL 182 01/15/2017 1417   TRIG 168.0 (H) 01/15/2017 1417   HDL 54.60 01/15/2017 1417   CHOLHDL 3 01/15/2017 1417   VLDL 33.6 01/15/2017 1417   LDLCALC 94 01/15/2017 1417   LDLDIRECT 81.0 07/15/2016 1419   Nutrition Diagnosis ? Food-and nutrition-related knowledge deficit related to lack of exposure to information as related to diagnosis of pulmonary disease ? 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 Identify food quantities necessary to achieve wt loss of  -2# per week to a goal wt loss of 2.7-10.9 kg (6-24 lb) at graduation from pulmonary rehab.  Plan:  Pt to attend Pulmonary Nutrition class Will provide client-centered nutrition education as part of interdisciplinary care.   Monitor and evaluate progress toward nutrition goal with team.  Monitor and Evaluate progress  toward nutrition goal with team.   Derek Mound, M.Ed, RD, LDN, CDE 06/19/2017 8:36 AM

## 2017-06-24 ENCOUNTER — Other Ambulatory Visit: Payer: Self-pay | Admitting: General Practice

## 2017-06-24 ENCOUNTER — Encounter (HOSPITAL_COMMUNITY)
Admission: RE | Admit: 2017-06-24 | Discharge: 2017-06-24 | Disposition: A | Discharge status: Home / Self Care | Payer: Medicare Other | Source: Ambulatory Visit | Attending: Internal Medicine | Admitting: Internal Medicine

## 2017-06-24 VITALS — Wt 276.9 lb

## 2017-06-24 DIAGNOSIS — Z7951 Long term (current) use of inhaled steroids: Secondary | ICD-10-CM | POA: Diagnosis not present

## 2017-06-24 DIAGNOSIS — R0609 Other forms of dyspnea: Principal | ICD-10-CM

## 2017-06-24 DIAGNOSIS — Z950 Presence of cardiac pacemaker: Secondary | ICD-10-CM | POA: Diagnosis not present

## 2017-06-24 DIAGNOSIS — Z79899 Other long term (current) drug therapy: Secondary | ICD-10-CM | POA: Diagnosis not present

## 2017-06-24 DIAGNOSIS — M1990 Unspecified osteoarthritis, unspecified site: Secondary | ICD-10-CM | POA: Diagnosis not present

## 2017-06-24 DIAGNOSIS — Z7901 Long term (current) use of anticoagulants: Secondary | ICD-10-CM | POA: Diagnosis not present

## 2017-06-24 MED ORDER — SERTRALINE HCL 100 MG PO TABS: 100.0000 mg | ORAL_TABLET | Freq: Every day | ORAL | 0 refills | Status: DC

## 2017-06-24 NOTE — Progress Notes (Signed)
Daily Session Note  Patient Details  Name: BEAUX VERNE MRN: [id number] Date of Birth: 06/15/1941 Referring Provider:     Pulmonary Rehab Walk Test from 06/17/2017 in [place]  Referring Provider  Dr. Annamaria Boots      Encounter Date: 06/24/2017  Check In:     Session Check In - 06/24/17 1527      Check-In   Location [facility]   Staff Present Rosebud Poles, RN, BSN;Ramon Dredge, RN, MHA;Jourdin Gens Ysidro Evert, RN;Olinty Celesta Aver, MS, ACSM CEP, Exercise Physiologist   Supervising physician immediately available to respond to emergencies [facility] immediately available   Physician(s) Dr. Burnis Medin   Medication changes reported     No   Fall or balance concerns reported    No   Tobacco Cessation No Change   Warm-up and Cool-down Performed as group-led instruction   Resistance Training Performed Yes   VAD Patient? No     Pain Assessment   Currently in Pain? No/denies   Multiple Pain Sites No      Capillary Blood Glucose: No results found for this or any previous visit (from the past 24 hour(s)).      Exercise Prescription Changes - 06/24/17 1500      Response to Exercise   Blood Pressure (Admit) 104/60   Blood Pressure (Exercise) 124/70   Blood Pressure (Exit) 100/60   Heart Rate (Admit) 79 bpm   Heart Rate (Exercise) 105 bpm   Heart Rate (Exit) 83 bpm   Oxygen Saturation (Admit) 94 %   Oxygen Saturation (Exercise) 94 %   Oxygen Saturation (Exit) 93 %   Rating of Perceived Exertion (Exercise) 13   Perceived Dyspnea (Exercise) 1   Duration Progress to 45 minutes of aerobic exercise without signs/symptoms of physical distress   Intensity Other (comment)  40-8-% of HRR     Progression   Progression Continue to progress workloads to maintain intensity without signs/symptoms of physical distress.     Resistance Training   Training Prescription Yes   Weight orange bands   Reps 10-15   Time 10 Minutes     NuStep   Level 2   Minutes 17   METs 1.9     Arm Ergometer   Level 1   Minutes 17     Track   Laps 5   Minutes 17      History  Smoking Status  . Former Smoker  . Packs/day: 2.00  . Years: 18.00  . Types: Cigarettes  Smokeless Tobacco  . Never Used    Comment: "quit smoking in 1971"    Goals Met:  No report of cardiac concerns or symptoms Strength training completed today  Goals Unmet:  Not Applicable  Comments: Service time is from 1330 to 1505    Dr. Rush Farmer is Medical Director for Pulmonary Rehab at [facility].

## 2017-06-26 ENCOUNTER — Encounter (HOSPITAL_COMMUNITY)
Admission: RE | Admit: 2017-06-26 | Discharge: 2017-06-26 | Disposition: A | Discharge status: Home / Self Care | Payer: Medicare Other | Source: Ambulatory Visit | Attending: Internal Medicine | Admitting: Internal Medicine

## 2017-06-26 VITALS — Wt 274.7 lb

## 2017-06-26 DIAGNOSIS — Z7901 Long term (current) use of anticoagulants: Secondary | ICD-10-CM | POA: Diagnosis not present

## 2017-06-26 DIAGNOSIS — Z7951 Long term (current) use of inhaled steroids: Secondary | ICD-10-CM | POA: Diagnosis not present

## 2017-06-26 DIAGNOSIS — R0609 Other forms of dyspnea: Principal | ICD-10-CM

## 2017-06-26 DIAGNOSIS — M1990 Unspecified osteoarthritis, unspecified site: Secondary | ICD-10-CM | POA: Diagnosis not present

## 2017-06-26 DIAGNOSIS — Z79899 Other long term (current) drug therapy: Secondary | ICD-10-CM | POA: Diagnosis not present

## 2017-06-26 DIAGNOSIS — Z950 Presence of cardiac pacemaker: Secondary | ICD-10-CM | POA: Diagnosis not present

## 2017-06-26 NOTE — Progress Notes (Signed)
Daily Session Note  Patient Details  Name: [name] [name] MRN: [id number] Date of Birth: 1941/07/28 Referring Provider:     Pulmonary Rehab Walk Test from 06/17/2017 in [place]  Referring Provider  Dr. Annamaria Boots      Encounter Date: 06/26/2017  Check In:     Session Check In - 06/26/17 1508      Check-In   Location [facility]   Staff Present Su Hilt, MS, ACSM RCEP, Exercise Physiologist;Joan Leonia Reeves, RN, Roque Cash, RN   Supervising physician immediately available to respond to emergencies [facility] immediately available   Physician(s) Dr. [place] Pink   Medication changes reported     No   Fall or balance concerns reported    No   Tobacco Cessation No Change   Warm-up and Cool-down Performed as group-led instruction   Resistance Training Performed Yes   VAD Patient? No     Pain Assessment   Currently in Pain? No/denies   Multiple Pain Sites No      Capillary Blood Glucose: No results found for this or any previous visit (from the past 24 hour(s)).      Exercise Prescription Changes - 06/26/17 1500      Response to Exercise   Blood Pressure (Admit) 120/60   Blood Pressure (Exercise) 130/84   Blood Pressure (Exit) 108/74   Heart Rate (Admit) 78 bpm   Heart Rate (Exercise) 94 bpm   Heart Rate (Exit) 70 bpm   Oxygen Saturation (Admit) 94 %   Oxygen Saturation (Exercise) 93 %   Oxygen Saturation (Exit) 95 %   Rating of Perceived Exertion (Exercise) 11   Perceived Dyspnea (Exercise) 1   Duration Progress to 45 minutes of aerobic exercise without signs/symptoms of physical distress   Intensity THRR unchanged     Progression   Progression Continue to progress workloads to maintain intensity without signs/symptoms of physical distress.     Resistance Training   Training Prescription Yes   Weight orange bands   Reps 10-15   Time 10 Minutes     NuStep   Level 2   Minutes 17   METs 1.1     Arm  Ergometer   Level 3   Minutes 17      History  Smoking Status  . Former Smoker  . Packs/day: 2.00  . Years: 18.00  . Types: Cigarettes  Smokeless Tobacco  . Never Used    Comment: "quit smoking in 1971"    Goals Met:  Exercise tolerated well No report of cardiac concerns or symptoms Strength training completed today  Goals Unmet:  Not Applicable  Comments: Service time is from 1330 to 1500   Dr. Rush Farmer is Medical Director for Pulmonary Rehab at [facility].

## 2017-06-30 ENCOUNTER — Ambulatory Visit (INDEPENDENT_AMBULATORY_CARE_PROVIDER_SITE_OTHER): Payer: Medicare Other | Admitting: Internal Medicine

## 2017-06-30 ENCOUNTER — Encounter: Payer: Self-pay | Admitting: Internal Medicine

## 2017-06-30 VITALS — BP 126/72 | HR 82 | Ht 66.0 in | Wt 273.6 lb

## 2017-06-30 DIAGNOSIS — Z9989 Dependence on other enabling machines and devices: Secondary | ICD-10-CM

## 2017-06-30 DIAGNOSIS — G4733 Obstructive sleep apnea (adult) (pediatric): Secondary | ICD-10-CM

## 2017-06-30 DIAGNOSIS — R918 Other nonspecific abnormal finding of lung field: Secondary | ICD-10-CM

## 2017-06-30 DIAGNOSIS — J40 Bronchitis, not specified as acute or chronic: Secondary | ICD-10-CM | POA: Diagnosis not present

## 2017-06-30 DIAGNOSIS — J8410 Pulmonary fibrosis, unspecified: Secondary | ICD-10-CM

## 2017-06-30 NOTE — Patient Instructions (Addendum)
Ok to stop Breo since it doesn't seem to be helping your breathing  Referral number to Bariatric Program for weight loss options  Order- future CT scan no contrast    June, 2019      Dx lung nodules  Please call as needed

## 2017-06-30 NOTE — Progress Notes (Signed)
HPI  female former smoker followed for dyspnea on exertion, lung nodules, fibrosis, complicated by A. 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 She does not qualify for portable oxygen CT chest 01/06/17-pulmonary fibrosis, small nodules ANA 1:160 ------------------------------------------------------------------------------------------ 05/19/17- 76 year old female former smoker followed for dyspnea on exertion, lung nodules, fibrosis, complicated by A. 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 Illness only lasted 2 or 3 days and now "much better". Rheumatology consult felt she did not have lupus despite elevated ANA. Question was whether this was cause of fibrotic changes and restriction. We reviewed CT images.. CT chest 05/05/17 IMPRESSION: Scattered mild subpleural nodularity in the lungs bilaterally, measuring up to 4 x 9 mm in the right lower lobe, likely benign. Three month stability has been demonstrated. Follow-up CT chest in 15-21 months is considered optional for low-risk patients, but is recommended for high-risk patients.  06/30/17- 76 year old female former smoker followed for dyspnea on exertion, lung nodules, fibrosis, ANA 3:664, complicated by A. Fib/ pacemaker/Eliquis ,a1AT carrierMZ, obesity OSA/CPAP/Advanced-cardiology FOLLOWS FOR: Pt continues to have SOB with exertion. Pt states Breo inhaler has caused her to have burning in mouth and no taste. Pt has been rinising her mouth well after use.  Likes her  pulmonary debilitation course. Cardiology manages her CPAP/Advanced? 41 We reviewed her CT image. There is minimal subpleural fibrosis, nonspecific. Tiny nodules have been stable. CT chest 05/05/17 IMPRESSION: Scattered mild subpleural nodularity in the lungs bilaterally, measuring up to 4 x 9 mm in the right lower lobe, likely benign. Three month stability has been demonstrated. Follow-up CT chest in 15-21 months is considered optional for low-risk patients, but is recommended for high-risk patients. This recommendation follows the consensus statement: Guidelines for Management of Incidental Pulmonary Nodules Detected on CT Images: From the Fleischner Society 2017; Radiology 2017; 284:228-243.  ROS-see HPI   + = pos Constitutional:    weight loss, night sweats, fevers, chills, fatigue, lassitude. HEENT:    headaches, difficulty swallowing, tooth/dental problems, sore throat,       sneezing, itching, ear ache, nasal congestion, post nasal drip, snoring CV:    chest pain, orthopnea, PND, swelling in lower extremities, anasarca,                                                   dizziness, palpitations Resp:   +shortness of breath with exertion or at rest.                productive cough,   non-productive cough, coughing up of blood.              change in color of mucus.  wheezing.   Skin:    rash or lesions. GI:  No-   heartburn, indigestion, abdominal pain, nausea, vomiting, diarrhea,                 change in bowel habits, loss of appetite GU: dysuria, change in color of urine, no urgency or frequency.  flank pain. MS:   joint pain, stiffness, decreased range of motion, back pain. Neuro-     nothing unusual Psych:  change in mood or affect.  depression or anxiety.   memory loss.  OBJ- Physical Exam General- Alert, Oriented, Affect-appropriate, Distress- none acute, + obese Skin- rash-none, lesions- none, excoriation- none Lymphadenopathy- none Head- atraumatic            Eyes-  Gross vision intact, PERRLA, conjunctivae and secretions clear            Ears- Hearing, canals-normal            Nose- Clear, no-Septal dev, mucus, polyps, erosion, perforation             Throat- Mallampati II , mucosa clear , drainage- none, tonsils- atrophic Neck- flexible , trachea midline, no stridor , thyroid nl, carotid no bruit Chest - symmetrical excursion , unlabored           Heart/CV- RRR , no murmur , no gallop  , no rub, nl s1 s2                           - JVD- none , edema- none, stasis changes- none, varices- none           Lung- + clear, wheeze- none, cough- none , dullness-none, rub- none           Chest wall- +Pacemaker Left Abd-  Br/ Gen/ Rectal- Not done, not indicated Extrem- cyanosis- none, clubbing, none, atrophy- none, strength- nl Neuro- grossly intact to observation

## 2017-07-01 ENCOUNTER — Encounter (HOSPITAL_COMMUNITY)
Admission: RE | Admit: 2017-07-01 | Discharge: 2017-07-01 | Disposition: A | Discharge status: Home / Self Care | Payer: Medicare Other | Source: Ambulatory Visit | Attending: Internal Medicine | Admitting: Internal Medicine

## 2017-07-01 VITALS — BP 100/56 | HR 97 | Wt 274.5 lb

## 2017-07-01 DIAGNOSIS — Z950 Presence of cardiac pacemaker: Secondary | ICD-10-CM | POA: Diagnosis not present

## 2017-07-01 DIAGNOSIS — R0609 Other forms of dyspnea: Secondary | ICD-10-CM | POA: Diagnosis not present

## 2017-07-01 DIAGNOSIS — Z7901 Long term (current) use of anticoagulants: Secondary | ICD-10-CM | POA: Diagnosis not present

## 2017-07-01 DIAGNOSIS — M1990 Unspecified osteoarthritis, unspecified site: Secondary | ICD-10-CM | POA: Diagnosis not present

## 2017-07-01 DIAGNOSIS — Z79899 Other long term (current) drug therapy: Secondary | ICD-10-CM | POA: Diagnosis not present

## 2017-07-01 DIAGNOSIS — Z7951 Long term (current) use of inhaled steroids: Secondary | ICD-10-CM | POA: Diagnosis not present

## 2017-07-01 NOTE — Progress Notes (Signed)
Daily Session Note  Patient Details  Name: [name] [name] MRN: [id number] Date of Birth: 1941/05/02 Referring Provider:     Pulmonary Rehab Walk Test from 06/17/2017 in [place]  Referring Provider  Dr. Annamaria Boots      Encounter Date: 07/01/2017  Check In:     Session Check In - 07/01/17 1524      Check-In   Location [facility]   Staff Present Su Hilt, MS, ACSM RCEP, Exercise Physiologist;Lisa Ysidro Evert, RN;Hanni Milford Celesta Aver, MS, ACSM CEP, Exercise Physiologist   Supervising physician immediately available to respond to emergencies [facility] immediately available   Physician(s) Dr. Maryland Pink   Medication changes reported     No   Fall or balance concerns reported    No   Tobacco Cessation No Change   Warm-up and Cool-down Performed as group-led instruction   Resistance Training Performed Yes   VAD Patient? No     Pain Assessment   Currently in Pain? No/denies   Multiple Pain Sites No      Capillary Blood Glucose: No results found for this or any previous visit (from the past 24 hour(s)).      Exercise Prescription Changes - 07/01/17 1500      Response to Exercise   Blood Pressure (Admit) 100/56   Blood Pressure (Exercise) 122/82   Blood Pressure (Exit) 104/60   Heart Rate (Admit) 97 bpm   Heart Rate (Exercise) 107 bpm   Heart Rate (Exit) 79 bpm   Oxygen Saturation (Admit) 96 %   Oxygen Saturation (Exercise) 93 %   Oxygen Saturation (Exit) 94 %   Rating of Perceived Exertion (Exercise) 13   Perceived Dyspnea (Exercise) 2   Duration Progress to 45 minutes of aerobic exercise without signs/symptoms of physical distress   Intensity THRR unchanged     Progression   Progression Continue to progress workloads to maintain intensity without signs/symptoms of physical distress.     Resistance Training   Training Prescription Yes   Weight orange bands   Reps 10-15   Time 10 Minutes     NuStep   Level 2   Minutes 17   METs 1.9     Arm Ergometer   Level 3   Minutes 17     Track   Laps 9   Minutes 17      History  Smoking Status  . Former Smoker  . Packs/day: 2.00  . Years: 18.00  . Types: Cigarettes  Smokeless Tobacco  . Never Used    Comment: "quit smoking in 1971"    Goals Met:  Exercise tolerated well Queuing for purse lip breathing No report of cardiac concerns or symptoms Strength training completed today  Goals Unmet:  Not Applicable  Comments: Service time is from 1330 to 1450    Dr. Rush Farmer is Medical Director for Pulmonary Rehab at [facility].

## 2017-07-01 NOTE — Progress Notes (Signed)
Pulmonary Individual Treatment Plan  Patient Details  Name: [name] [name] MRN: [id number] Date of Birth: 1941-02-23 Referring Provider:     Pulmonary Rehab Walk Test from 06/17/2017 in [place]  Referring Provider  Dr. Annamaria Boots      Initial Encounter Date:    Pulmonary Rehab Walk Test from 06/17/2017 in [place]  Date  06/19/17  Referring Provider  Dr. Annamaria Boots      Visit Diagnosis: Dyspnea on exertion  Patient's Home Medications on Admission:   Current Outpatient Prescriptions:  .  apixaban (ELIQUIS) 5 MG TABS tablet, Take 1 tablet (5 mg total) by mouth 2 (two) times daily., Disp: 28 tablet, Rfl: 0 .  Biotin 5000 MCG CAPS, Take 5,000 mcg by mouth daily. , Disp: , Rfl:  .  BREO ELLIPTA 100-25 MCG/INH AEPB, Take 1 puff by mouth daily., Disp: , Rfl:  .  Calcium Carbonate-Vit D-Min (CALCIUM 1200 PO), Take 2,400 mg by mouth daily. , Disp: , Rfl:  .  cholecalciferol (VITAMIN D) 1000 UNITS tablet, Take 1,000 Units by mouth daily. , Disp: , Rfl:  .  Co-Enzyme Q10 100 MG CAPS, Take 100 mg by mouth daily. , Disp: , Rfl:  .  diazepam (VALIUM) 5 MG tablet, Take 1 tablet (5 mg total) by mouth as needed for anxiety., Disp: 30 tablet, Rfl: 1 .  folic acid (FOLVITE) 193 MCG tablet, Take 400 mcg by mouth daily., Disp: , Rfl:  .  glucosamine-chondroitin 500-400 MG tablet, Take 1 tablet by mouth daily. , Disp: , Rfl:  .  loratadine (KLS ALLERCLEAR) 10 MG tablet, Take 10 mg by mouth daily., Disp: , Rfl:  .  MAGNESIUM CITRATE PO, Take 250 mg by mouth daily. 1 tab daily, Disp: , Rfl:  .  metoprolol tartrate (LOPRESSOR) 25 MG tablet, Take 1 tablet (25 mg total) by mouth 2 (two) times daily., Disp: 180 tablet, Rfl: 2 .  pantoprazole (PROTONIX) 40 MG tablet, TAKE 1 TABLET BY MOUTH DAILY, Disp: 60 tablet, Rfl: 1 .  Resveratrol 250 MG CAPS, Take 250 mg by mouth daily., Disp: , Rfl:  .  sertraline (ZOLOFT) 100 MG tablet, Take 1 tablet (100 mg  total) by mouth daily., Disp: 90 tablet, Rfl: 0 .  Simethicone (GAS-X PO), Take 2 tablets by mouth daily as needed (bloating)., Disp: , Rfl:   Past Medical History: Past Medical History:  Diagnosis Date  . Alpha-1-antitrypsin deficiency carrier ([place])   . Arthritis    "all over"  . Atrial fibrillation ([place])   . Complication of anesthesia    "I woke up during 2 different procedures" (10/04/2014)  . Frequent UTI   . GERD (gastroesophageal reflux disease)   . Hypertension   . MVP (mitral valve prolapse)   . Obesity   . Presence of permanent cardiac pacemaker   . PVC's (premature ventricular contractions)   . 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 Stroke [facility]) 01/2014   denies deficits on 10/04/2014  . Tachy-brady syndrome ([place]) 10/2014    Tobacco Use: History  Smoking Status  . Former Smoker  . Packs/day: 2.00  . Years: 18.00  . Types: Cigarettes  Smokeless Tobacco  . Never Used    Comment: "quit smoking in 1971"    Labs: Recent Review Flowsheet Data    Labs for ITP Cardiac and Pulmonary Rehab Latest Ref Rng & Units 03/08/2014 03/09/2014 10/25/2014 07/15/2016 01/15/2017   Cholestrol  0 - 200 mg/dL - 152 184 163 182   LDLCALC 0 - 99 mg/dL - 77 138 - 94   LDLDIRECT mg/dL - - - 81.0 -   HDL >39.00 mg/dL - 58 46 45.70 54.60   Trlycerides 0.0 - 149.0 mg/dL - 87 210(A) 208.0(H) 168.0(H)   Hemoglobin A1c <5.7 % - 5.9(H) - - -   TCO2 0 - 100 mmol/L 26 - - - -      Capillary Blood Glucose: Lab Results  Component Value Date   GLUCAP 103 (H) 03/09/2014   GLUCAP 103 (H) 03/09/2014   GLUCAP 112 (H) 03/09/2014     ADL UCSD:     Pulmonary Assessment Scores    Row Name 06/11/17 0923 06/19/17 0728       ADL UCSD   ADL Phase Entry Entry    SOB Score total 63  -      CAT Score   CAT Score 21  Entry  -      mMRC Score   mMRC Score  - 4       Pulmonary Function Assessment:     Pulmonary Function Assessment - 06/09/17 1315       Breath   Bilateral Breath Sounds Clear   Shortness of Breath Yes;Limiting activity;Fear of Shortness of Breath      Exercise Target Goals:    Exercise Program Goal: Individual exercise prescription set with THRR, safety & activity barriers. Participant demonstrates ability to understand and report RPE using BORG scale, to self-measure pulse accurately, and to acknowledge the importance of the exercise prescription.  Exercise Prescription Goal: Starting with aerobic activity 30 plus minutes a day, 3 days per week for initial exercise prescription. Provide home exercise prescription and guidelines that participant acknowledges understanding prior to discharge.  Activity Barriers & Risk Stratification:   6 Minute Walk:     6 Minute Walk    Row Name 06/19/17 0723         6 Minute Walk   Phase Initial     Distance 800 feet     Walk Time -  4 minutes 50 seconds total     # of Rest Breaks 2  1st: 40 seconds 2nd: 30 seconds     MPH 1.5     METS 2.15     RPE 13     Perceived Dyspnea  3     Symptoms Yes (comment)     Comments used wheelchair/5/10 hip pain/limited by shortness of breath     Resting HR 90 bpm     Resting BP 105/66     Max Ex. HR 127 bpm     Max Ex. BP 105/65       Interval HR   Baseline HR 90     1 Minute HR 144     2 Minute HR 127     3 Minute HR 102     4 Minute HR 98     5 Minute HR 98     6 Minute HR 102     2 Minute Post HR 95     Interval Heart Rate? Yes       Interval Oxygen   Interval Oxygen? Yes     Baseline Oxygen Saturation % 94 %     Baseline Liters of Oxygen 0 L     1 Minute Oxygen Saturation % 93 %     1 Minute Liters of Oxygen 0 L     2 Minute  Oxygen Saturation % 93 %     2 Minute Liters of Oxygen 0 L     3 Minute Oxygen Saturation % 93 %     3 Minute Liters of Oxygen 0 L     4 Minute Oxygen Saturation % 94 %     4 Minute Liters of Oxygen 0 L     5 Minute Oxygen Saturation % 92 %     5 Minute Liters of Oxygen 0 L     6  Minute Oxygen Saturation % 92 %     6 Minute Liters of Oxygen 0 L     2 Minute Post Oxygen Saturation % 93 %     2 Minute Post Liters of Oxygen 0 L        Oxygen Initial Assessment:     Oxygen Initial Assessment - 06/19/17 0726      Initial 6 min Walk   Oxygen Used None   Resting Oxygen Saturation  during 6 min walk 94 %   Exercise Oxygen Saturation  during 6 min walk 92 %     Program Oxygen Prescription   Program Oxygen Prescription None      Oxygen Re-Evaluation:     Oxygen Re-Evaluation    Row Name 07/01/17 1924             Program Oxygen Prescription   Program Oxygen Prescription None         Home Oxygen   Home Oxygen Device None       Sleep Oxygen Prescription CPAP       Liters per minute 0       Home Exercise Oxygen Prescription None       Home at Rest Exercise Oxygen Prescription None       Compliance with Home Oxygen Use Yes          Oxygen Discharge (Final Oxygen Re-Evaluation):     Oxygen Re-Evaluation - 07/01/17 1924      Program Oxygen Prescription   Program Oxygen Prescription None     Home Oxygen   Home Oxygen Device None   Sleep Oxygen Prescription CPAP   Liters per minute 0   Home Exercise Oxygen Prescription None   Home at Rest Exercise Oxygen Prescription None   Compliance with Home Oxygen Use Yes      Initial Exercise Prescription:     Initial Exercise Prescription - 06/19/17 0700      Date of Initial Exercise RX and Referring Provider   Date 06/19/17   Referring Provider Dr. Annamaria Boots     Recumbant Bike   Level 1   Minutes 17     NuStep   Level 2   Minutes 17   METs 1.5     Track   Laps 4   Minutes 17     Prescription Details   Frequency (times per week) 2   Duration Progress to 45 minutes of aerobic exercise without signs/symptoms of physical distress     Intensity   THRR 40-80% of Max Heartrate 58-115   Ratings of Perceived Exertion 11-13   Perceived Dyspnea 0-4     Progression   Progression Continue  progressive overload as per policy without signs/symptoms or physical distress.     Resistance Training   Training Prescription Yes   Weight orange bands   Reps 10-15      Perform Capillary Blood Glucose checks as needed.  Exercise Prescription Changes:     Exercise Prescription Changes  Row Name 06/24/17 1500 06/26/17 1500 07/01/17 1500         Response to Exercise   Blood Pressure (Admit) 104/60 120/60 100/56     Blood Pressure (Exercise) 124/70 130/84 122/82     Blood Pressure (Exit) 100/60 108/74 104/60     Heart Rate (Admit) 79 bpm 78 bpm 97 bpm     Heart Rate (Exercise) 105 bpm 94 bpm 107 bpm     Heart Rate (Exit) 83 bpm 70 bpm 79 bpm     Oxygen Saturation (Admit) 94 % 94 % 96 %     Oxygen Saturation (Exercise) 94 % 93 % 93 %     Oxygen Saturation (Exit) 93 % 95 % 94 %     Rating of Perceived Exertion (Exercise) _0 Perceived Dyspnea (Exercise) _1 Duration Progress to 45 minutes of aerobic exercise without signs/symptoms of physical distress Progress to 45 minutes of aerobic exercise without signs/symptoms of physical distress Progress to 45 minutes of aerobic exercise without signs/symptoms of physical distress     Intensity Other (comment)  40-8-% of HRR THRR unchanged THRR unchanged       Progression   Progression Continue to progress workloads to maintain intensity without signs/symptoms of physical distress. Continue to progress workloads to maintain intensity without signs/symptoms of physical distress. Continue to progress workloads to maintain intensity without signs/symptoms of physical distress.       Resistance Training   Training Prescription Yes Yes Yes     Weight orange bands orange bands orange bands     Reps 10-15 10-15 10-15     Time 10 Minutes 10 Minutes 10 Minutes       NuStep   Level _2 Minutes _3 METs 1.9 1.1 1.9       Arm Ergometer   Level _4 Minutes _5 Track   Laps 5  - 9      Minutes 17  - 17        Exercise Comments:   Exercise Goals and Review:   Exercise Goals Re-Evaluation :     Exercise Goals Re-Evaluation    Row Name 06/27/17 1042             Exercise Goal Re-Evaluation   Exercise Goals Review Increase Physical Activity;Increase Strenth and Stamina       Comments Patient has only attended two exercise sessions. Will cont. to monitor and progress as able.        Expected Outcomes Through exercising at rehab and at home, patient will increase physical strength and stamina and find ADL's easier to perform.           Discharge Exercise Prescription (Final Exercise Prescription Changes):     Exercise Prescription Changes - 07/01/17 1500      Response to Exercise   Blood Pressure (Admit) 100/56   Blood Pressure (Exercise) 122/82   Blood Pressure (Exit) 104/60   Heart Rate (Admit) 97 bpm   Heart Rate (Exercise) 107 bpm   Heart Rate (Exit) 79 bpm   Oxygen Saturation (Admit) 96 %   Oxygen Saturation (Exercise) 93 %   Oxygen Saturation (Exit) 94 %   Rating of Perceived Exertion (Exercise) 13   Perceived Dyspnea (Exercise) 2   Duration Progress to 45 minutes of aerobic exercise  without signs/symptoms of physical distress   Intensity THRR unchanged     Progression   Progression Continue to progress workloads to maintain intensity without signs/symptoms of physical distress.     Resistance Training   Training Prescription Yes   Weight orange bands   Reps 10-15   Time 10 Minutes     NuStep   Level 2   Minutes 17   METs 1.9     Arm Ergometer   Level 3   Minutes 17     Track   Laps 9   Minutes 17      Nutrition:  Target Goals: Understanding of nutrition guidelines, daily intake of sodium <1571m, cholesterol <2025m calories 30% from fat and 7% or less from saturated fats, daily to have 5 or more servings of fruits and vegetables.  Biometrics:    Nutrition Therapy Plan and Nutrition Goals:     Nutrition Therapy &  Goals - 06/19/17 0842      Nutrition Therapy   Diet General, Healthful     Personal Nutrition Goals   Nutrition Goal Identify food quantities necessary to achieve wt loss of  -2# per week to a goal wt loss of 2.7-10.9 kg (6-24 lb) at graduation from pulmonary rehab.     Intervention Plan   Intervention Prescribe, educate and counsel regarding individualized specific dietary modifications aiming towards targeted core components such as weight, hypertension, lipid management, diabetes, heart failure and other comorbidities.   Expected Outcomes Short Term Goal: Understand basic principles of dietary content, such as calories, fat, sodium, cholesterol and nutrients.;Long Term Goal: Adherence to prescribed nutrition plan.      Nutrition Discharge: Rate Your Plate Scores:     Nutrition Assessments - 06/19/17 0836      Rate Your Plate Scores   Pre Score 46      Nutrition Goals Re-Evaluation:   Nutrition Goals Discharge (Final Nutrition Goals Re-Evaluation):   Psychosocial: Target Goals: Acknowledge presence or absence of significant depression and/or stress, maximize coping skills, provide positive support system. Participant is able to verbalize types and ability to use techniques and skills needed for reducing stress and depression.  Initial Review & Psychosocial Screening:     Initial Psych Review & Screening - 06/09/17 1328      Initial Review   Current issues with None Identified     Family Dynamics   Good Support System? Yes     Barriers   Psychosocial barriers to participate in program There are no identifiable barriers or psychosocial needs.     Screening Interventions   Interventions Encouraged to exercise      Quality of Life Scores:   PHQ-9: Recent Review Flowsheet Data    [facility]9 06/09/2017 01/15/2017 07/15/2016   Decreased Interest 3 0 0   Down, Depressed, Hopeless - 0  0   PHQ - 2 Score 3 0 0   Altered sleeping 0 - -   Tired,  decreased energy 3 - -   Change in appetite 3 - -   Feeling bad or failure about yourself  3 - -   Trouble concentrating 0 - -   Moving slowly or fidgety/restless 0 - -   Suicidal thoughts 0 - -   PHQ-9 Score 12 - -   Difficult doing work/chores Not difficult at all - -     Interpretation of Total Score  Total Score Depression Severity:  1-4 = Minimal depression, 5-9 = Mild depression, 10-14 = Moderate depression,  15-19 = Moderately severe depression, 20-27 = Severe depression   Psychosocial Evaluation and Intervention:     Psychosocial Evaluation - 06/09/17 1330      Psychosocial Evaluation & Interventions   Interventions Encouraged to exercise with the program and follow exercise prescription   Comments dealing depression, but is working on making herself happy.   Continue Psychosocial Services  No Follow up required      Psychosocial Re-Evaluation:     Psychosocial Re-Evaluation    [place] Name 07/01/17 1926             Psychosocial Re-Evaluation   Current issues with None Identified          Psychosocial Discharge (Final Psychosocial Re-Evaluation):     Psychosocial Re-Evaluation - 07/01/17 1926      Psychosocial Re-Evaluation   Current issues with None Identified      Education: Education Goals: Education classes will be provided on a weekly basis, covering required topics. Participant will state understanding/return demonstration of topics presented.  Learning Barriers/Preferences:     Learning Barriers/Preferences - 06/09/17 1314      Learning Barriers/Preferences   Learning Barriers None   Learning Preferences Pictoral      Education Topics: Risk Factor Reduction:  -Group instruction that is supported by a PowerPoint presentation. Instructor discusses the definition of a risk factor, different risk factors for pulmonary disease, and how the heart and lungs work together.     PULMONARY REHAB OTHER RESPIRATORY from 06/26/2017 in [place]  Date  06/26/17  Educator  ep  Instruction Review Code  2- meets goals/outcomes      Nutrition for Pulmonary Patient:  -Group instruction provided by PowerPoint slides, verbal discussion, and written materials to support subject matter. The instructor gives an explanation and review of healthy diet recommendations, which includes a discussion on weight management, recommendations for fruit and vegetable consumption, as well as protein, fluid, caffeine, fiber, sodium, sugar, and alcohol. Tips for eating when patients are short of breath are discussed.   Pursed Lip Breathing:  -Group instruction that is supported by demonstration and informational handouts. Instructor discusses the benefits of pursed lip and diaphragmatic breathing and detailed demonstration on how to preform both.     Oxygen Safety:  -Group instruction provided by PowerPoint, verbal discussion, and written material to support subject matter. There is an overview of "What is Oxygen" and "Why do we need it".  Instructor also reviews how to create a safe environment for oxygen use, the importance of using oxygen as prescribed, and the risks of noncompliance. There is a brief discussion on traveling with oxygen and resources the patient may utilize.   Oxygen Equipment:  -Group instruction provided by [facility] Staff utilizing handouts, written materials, and equipment demonstrations.   Signs and Symptoms:  -Group instruction provided by written material and verbal discussion to support subject matter. Warning signs and symptoms of infection, stroke, and heart attack are reviewed and when to call the physician/911 reinforced. Tips for preventing the spread of infection discussed.   Advanced Directives:  -Group instruction provided by verbal instruction and written material to support subject matter. Instructor reviews Advanced Directive laws and proper instruction for filling out  document.   Pulmonary Video:  -Group video education that reviews the importance of medication and oxygen compliance, exercise, good nutrition, pulmonary hygiene, and pursed lip and diaphragmatic breathing for the pulmonary patient.   Exercise for the Pulmonary Patient:  -Group instruction that is supported  by a PowerPoint presentation. Instructor discusses benefits of exercise, core components of exercise, frequency, duration, and intensity of an exercise routine, importance of utilizing pulse oximetry during exercise, safety while exercising, and options of places to exercise outside of rehab.     Pulmonary Medications:  -Verbally interactive group education provided by instructor with focus on inhaled medications and proper administration.   Anatomy and Physiology of the Respiratory System and Intimacy:  -Group instruction provided by PowerPoint, verbal discussion, and written material to support subject matter. Instructor reviews respiratory cycle and anatomical components of the respiratory system and their functions. Instructor also reviews differences in obstructive and restrictive respiratory diseases with examples of each. Intimacy, Sex, and Sexuality differences are reviewed with a discussion on how relationships can change when diagnosed with pulmonary disease. Common sexual concerns are reviewed.   MD DAY -A group question and answer session with a medical doctor that allows participants to ask questions that relate to their pulmonary disease state.   OTHER EDUCATION -Group or individual verbal, written, or video instructions that support the educational goals of the pulmonary rehab program.   Knowledge Questionnaire Score:     Knowledge Questionnaire Score - 06/11/17 0923      Knowledge Questionnaire Score   Pre Score 12/13      Core Components/Risk Factors/Patient Goals at Admission:     Personal Goals and Risk Factors at Admission - 06/09/17 1325      Core  Components/Risk Factors/Patient Goals on Admission    Weight Management Obesity;Yes   Intervention Weight Management: Develop a combined nutrition and exercise program designed to reach desired caloric intake, while maintaining appropriate intake of nutrient and fiber, sodium and fats, and appropriate energy expenditure required for the weight goal.   Admit Weight 276 lb 0.3 oz (125.2 kg)   Goal Weight: Short Term 271 lb 2.7 oz (123 kg)   Goal Weight: Long Term 264 lb 8.8 oz (120 kg)   Expected Outcomes Short Term: Continue to assess and modify interventions until short term weight is achieved   Improve shortness of breath with ADL's Yes   Intervention Provide education, individualized exercise plan and daily activity instruction to help decrease symptoms of SOB with activities of daily living.   Expected Outcomes Short Term: Achieves a reduction of symptoms when performing activities of daily living.   Develop more efficient breathing techniques such as purse lipped breathing and diaphragmatic breathing; and practicing self-pacing with activity Yes   Intervention Provide education, demonstration and support about specific breathing techniuqes utilized for more efficient breathing. Include techniques such as pursed lipped breathing, diaphragmatic breathing and self-pacing activity.   Expected Outcomes Short Term: Participant will be able to demonstrate and use breathing techniques as needed throughout daily activities.      Core Components/Risk Factors/Patient Goals Review:      Goals and Risk Factor Review    Row Name 07/01/17 1924             Core Components/Risk Factors/Patient Goals Review   Review patient has only attended 3 exercise sessions since admission and is too soon to evaluate progression towards rehab goals       Expected Outcomes see admission expected outcomes          Core Components/Risk Factors/Patient Goals at Discharge (Final Review):      Goals and Risk  Factor Review - 07/01/17 1924      Core Components/Risk Factors/Patient Goals Review   Review patient has only attended 3 exercise sessions  since admission and is too soon to evaluate progression towards rehab goals   Expected Outcomes see admission expected outcomes      ITP Comments:   Comments: ITP REVIEW Unable to evaluate patients expected progress toward pulmonary rehab goals after completing 3 sessions. Expect to see progress over the next 30 days. Recommend continued exercise, life style modification, education, and utilization of breathing techniques to increase stamina and strength and decrease shortness of breath with exertion.

## 2017-07-03 ENCOUNTER — Encounter (HOSPITAL_COMMUNITY)
Admission: RE | Admit: 2017-07-03 | Discharge: 2017-07-03 | Disposition: A | Discharge status: Home / Self Care | Payer: Medicare Other | Source: Ambulatory Visit | Attending: Internal Medicine | Admitting: Internal Medicine

## 2017-07-03 VITALS — Wt 274.5 lb

## 2017-07-03 DIAGNOSIS — E669 Obesity, unspecified: Secondary | ICD-10-CM | POA: Insufficient documentation

## 2017-07-03 DIAGNOSIS — Z87891 Personal history of nicotine dependence: Secondary | ICD-10-CM | POA: Diagnosis not present

## 2017-07-03 DIAGNOSIS — Z950 Presence of cardiac pacemaker: Secondary | ICD-10-CM | POA: Diagnosis not present

## 2017-07-03 DIAGNOSIS — R0609 Other forms of dyspnea: Secondary | ICD-10-CM | POA: Diagnosis not present

## 2017-07-03 DIAGNOSIS — Z7901 Long term (current) use of anticoagulants: Secondary | ICD-10-CM | POA: Diagnosis not present

## 2017-07-03 DIAGNOSIS — I10 Essential (primary) hypertension: Secondary | ICD-10-CM | POA: Diagnosis not present

## 2017-07-03 DIAGNOSIS — M1990 Unspecified osteoarthritis, unspecified site: Secondary | ICD-10-CM | POA: Insufficient documentation

## 2017-07-03 DIAGNOSIS — Z7951 Long term (current) use of inhaled steroids: Secondary | ICD-10-CM | POA: Insufficient documentation

## 2017-07-03 DIAGNOSIS — Z79899 Other long term (current) drug therapy: Secondary | ICD-10-CM | POA: Insufficient documentation

## 2017-07-03 DIAGNOSIS — I495 Sick sinus syndrome: Secondary | ICD-10-CM | POA: Diagnosis not present

## 2017-07-03 DIAGNOSIS — I341 Nonrheumatic mitral (valve) prolapse: Secondary | ICD-10-CM | POA: Diagnosis not present

## 2017-07-03 DIAGNOSIS — K219 Gastro-esophageal reflux disease without esophagitis: Secondary | ICD-10-CM | POA: Insufficient documentation

## 2017-07-03 DIAGNOSIS — Z6841 Body Mass Index (BMI) 40.0 and over, adult: Secondary | ICD-10-CM | POA: Insufficient documentation

## 2017-07-03 DIAGNOSIS — I4891 Unspecified atrial fibrillation: Secondary | ICD-10-CM | POA: Insufficient documentation

## 2017-07-03 DIAGNOSIS — Z8673 Personal history of transient ischemic attack (TIA), and cerebral infarction without residual deficits: Secondary | ICD-10-CM | POA: Insufficient documentation

## 2017-07-03 NOTE — Progress Notes (Signed)
Daily Session Note  Patient Details  Name: [name] [name] MRN: [id number] Date of Birth: [birth year] Referring Provider:     Pulmonary Rehab Walk Test from 06/17/2017 in [place]  Referring Provider  Dr. Annamaria Boots      Encounter Date: 07/03/2017  Check In:     Session Check In - 07/03/17 1339      Check-In   Location [facility]   Staff Present Su Hilt, MS, ACSM RCEP, Exercise Physiologist;Keylor Rands Ysidro Evert, RN;Portia Rollene Rotunda, RN, BSN   Supervising physician immediately available to respond to emergencies [facility] immediately available   Physician(s) Dr. Jonnie Finner   Medication changes reported     No   Fall or balance concerns reported    No   Tobacco Cessation No Change   Warm-up and Cool-down Performed as group-led instruction   Resistance Training Performed Yes   VAD Patient? No     Pain Assessment   Currently in Pain? No/denies   Multiple Pain Sites No      Capillary Blood Glucose: No results found for this or any previous visit (from the past 24 hour(s)).      Exercise Prescription Changes - 07/03/17 1500      Response to Exercise   Blood Pressure (Admit) 96/60   Blood Pressure (Exercise) 130/86   Blood Pressure (Exit) 126/60   Heart Rate (Admit) 79 bpm   Heart Rate (Exercise) 105 bpm   Heart Rate (Exit) 71 bpm   Oxygen Saturation (Admit) 96 %   Oxygen Saturation (Exercise) 87 %  Sat increased to 92% with rest   Oxygen Saturation (Exit) 93 %   Rating of Perceived Exertion (Exercise) 15   Perceived Dyspnea (Exercise) 3   Duration Progress to 45 minutes of aerobic exercise without signs/symptoms of physical distress   Intensity THRR unchanged     Progression   Progression Continue to progress workloads to maintain intensity without signs/symptoms of physical distress.     Resistance Training   Training Prescription Yes   Weight orange bands   Reps 10-15   Time 10 Minutes     Arm Ergometer   Level  3   Minutes 17     Track   Laps 11   Minutes 17      History  Smoking Status  . Former Smoker  . Packs/day: 2.00  . Years: 18.00  . Types: Cigarettes  Smokeless Tobacco  . Never Used    Comment: "quit smoking in 1971"    Goals Met:  Exercise tolerated well No report of cardiac concerns or symptoms Strength training completed today  Goals Unmet:  Not Applicable  Comments: Service time is from 1330 to 1515    Dr. Rush Farmer is Medical Director for Pulmonary Rehab at [facility].

## 2017-07-04 NOTE — Assessment & Plan Note (Signed)
She can't tell that Memory Dance is making any difference so we're going to let her stop for observation. She is not demonstrating a reactive component. This is mainly emphysema.

## 2017-07-04 NOTE — Assessment & Plan Note (Signed)
She continues to benefit from CPAP managed by cardiology/[facility]

## 2017-07-04 NOTE — Assessment & Plan Note (Signed)
Currently stable. For future CT.

## 2017-07-04 NOTE — Assessment & Plan Note (Addendum)
Minimal nonspecific subpleural interstitial prominence Plan-continue to follow with future CT scan

## 2017-07-04 NOTE — Assessment & Plan Note (Signed)
She is anxious to have some help with weight loss. I offered bariatric referral for counseling and she accepted.

## 2017-07-07 ENCOUNTER — Ambulatory Visit (INDEPENDENT_AMBULATORY_CARE_PROVIDER_SITE_OTHER): Payer: Medicare Other | Admitting: *Deleted

## 2017-07-07 DIAGNOSIS — I495 Sick sinus syndrome: Secondary | ICD-10-CM

## 2017-07-08 ENCOUNTER — Encounter (HOSPITAL_COMMUNITY)
Admission: RE | Admit: 2017-07-08 | Discharge: 2017-07-08 | Disposition: A | Discharge status: Home / Self Care | Payer: Medicare Other | Source: Ambulatory Visit | Attending: Internal Medicine | Admitting: Internal Medicine

## 2017-07-08 VITALS — Wt 276.5 lb

## 2017-07-08 DIAGNOSIS — R0609 Other forms of dyspnea: Principal | ICD-10-CM

## 2017-07-08 DIAGNOSIS — Z950 Presence of cardiac pacemaker: Secondary | ICD-10-CM | POA: Diagnosis not present

## 2017-07-08 DIAGNOSIS — Z7951 Long term (current) use of inhaled steroids: Secondary | ICD-10-CM | POA: Diagnosis not present

## 2017-07-08 DIAGNOSIS — Z7901 Long term (current) use of anticoagulants: Secondary | ICD-10-CM | POA: Diagnosis not present

## 2017-07-08 DIAGNOSIS — Z79899 Other long term (current) drug therapy: Secondary | ICD-10-CM | POA: Diagnosis not present

## 2017-07-08 DIAGNOSIS — M1990 Unspecified osteoarthritis, unspecified site: Secondary | ICD-10-CM | POA: Diagnosis not present

## 2017-07-08 NOTE — Progress Notes (Signed)
Daily Session Note  Patient Details  Name: [name] [name] MRN: [id number] Date of Birth: December 20, 1940 Referring Provider:     Pulmonary Rehab Walk Test from 06/17/2017 in [place]  Referring Provider  Dr. Annamaria Boots      Encounter Date: 07/08/2017  Check In:     Session Check In - 07/08/17 1330      Check-In   Location [facility]   Staff Present Su Hilt, MS, ACSM RCEP, Exercise Physiologist;Lisa Ysidro Evert, RN;Portia Rollene Rotunda, Therapist, sports, BSN;Ramon Dredge, RN, [facility]   Supervising physician immediately available to respond to emergencies [facility] immediately available   Physician(s) Dr. Allyson Sabal   Medication changes reported     No   Fall or balance concerns reported    No   Tobacco Cessation No Change   Warm-up and Cool-down Performed as group-led instruction   Resistance Training Performed Yes   VAD Patient? No     Pain Assessment   Currently in Pain? No/denies   Multiple Pain Sites No      Capillary Blood Glucose: No results found for this or any previous visit (from the past 24 hour(s)).      Exercise Prescription Changes - 07/08/17 1500      Response to Exercise   Blood Pressure (Admit) 110/64   Blood Pressure (Exercise) 118/70   Blood Pressure (Exit) 108/78   Heart Rate (Admit) 72 bpm   Heart Rate (Exercise) 94 bpm   Heart Rate (Exit) 76 bpm   Oxygen Saturation (Admit) 94 %   Oxygen Saturation (Exercise) 93 %  Sat increased to 92% with rest   Oxygen Saturation (Exit) 96 %   Rating of Perceived Exertion (Exercise) 13   Perceived Dyspnea (Exercise) 1   Duration Progress to 45 minutes of aerobic exercise without signs/symptoms of physical distress   Intensity THRR unchanged     Progression   Progression Continue to progress workloads to maintain intensity without signs/symptoms of physical distress.     Resistance Training   Training Prescription Yes   Weight orange bands   Reps 10-15   Time 10 Minutes      NuStep   Level 2   Minutes 17   METs 1.8     Arm Ergometer   Level 3   Minutes 17     Track   Laps 12   Minutes 17      History  Smoking Status  . Former Smoker  . Packs/day: 2.00  . Years: 18.00  . Types: Cigarettes  Smokeless Tobacco  . Never Used    Comment: "quit smoking in 1971"    Goals Met:  Exercise tolerated well No report of cardiac concerns or symptoms Strength training completed today  Goals Unmet:  Not Applicable  Comments: Service time is from 1:30P to 3:10P    Dr. Rush Farmer is Medical Director for Pulmonary Rehab at [facility].

## 2017-07-08 NOTE — Progress Notes (Signed)
Remote pacemaker transmission.   

## 2017-07-09 ENCOUNTER — Encounter: Payer: Self-pay | Admitting: Cardiology

## 2017-07-10 ENCOUNTER — Encounter (HOSPITAL_COMMUNITY)
Admission: RE | Admit: 2017-07-10 | Discharge: 2017-07-10 | Disposition: A | Discharge status: Home / Self Care | Payer: Medicare Other | Source: Ambulatory Visit | Attending: Internal Medicine | Admitting: Internal Medicine

## 2017-07-10 VITALS — Wt 276.2 lb

## 2017-07-10 DIAGNOSIS — Z7951 Long term (current) use of inhaled steroids: Secondary | ICD-10-CM | POA: Diagnosis not present

## 2017-07-10 DIAGNOSIS — Z79899 Other long term (current) drug therapy: Secondary | ICD-10-CM | POA: Diagnosis not present

## 2017-07-10 DIAGNOSIS — M1990 Unspecified osteoarthritis, unspecified site: Secondary | ICD-10-CM | POA: Diagnosis not present

## 2017-07-10 DIAGNOSIS — Z7901 Long term (current) use of anticoagulants: Secondary | ICD-10-CM | POA: Diagnosis not present

## 2017-07-10 DIAGNOSIS — Z950 Presence of cardiac pacemaker: Secondary | ICD-10-CM | POA: Diagnosis not present

## 2017-07-10 DIAGNOSIS — R0609 Other forms of dyspnea: Secondary | ICD-10-CM | POA: Diagnosis not present

## 2017-07-10 NOTE — Progress Notes (Signed)
Daily Session Note  Patient Details  Name: [name] [name] MRN: [id number] Date of Birth: May 16, 1941 Referring Provider:     Pulmonary Rehab Walk Test from 06/17/2017 in [place]  Referring Provider  Dr. Annamaria Boots      Encounter Date: 07/10/2017  Check In:     Session Check In - 07/10/17 1348      Check-In   [facility]   Staff Present Su Hilt, MS, ACSM RCEP, Exercise Physiologist;Dameir Gentzler Ysidro Evert, RN;Portia Rollene Rotunda, RN, BSN   Supervising physician immediately available to respond to emergencies [facility] immediately available   Physician(s) Dr. Clementeen Graham   Medication changes reported     No   Fall or balance concerns reported    No   Tobacco Cessation No Change   Warm-up and Cool-down Performed as group-led instruction   Resistance Training Performed Yes   VAD Patient? No     Pain Assessment   Currently in Pain? No/denies   Multiple Pain Sites No      Capillary Blood Glucose: No results found for this or any previous visit (from the past 24 hour(s)).      Exercise Prescription Changes - 07/10/17 1500      Response to Exercise   Blood Pressure (Admit) 96/64   Blood Pressure (Exercise) 112/86   Blood Pressure (Exit) 100/60   Heart Rate (Admit) 75 bpm   Heart Rate (Exercise) 88 bpm   Heart Rate (Exit) 74 bpm   Oxygen Saturation (Admit) 96 %   Oxygen Saturation (Exercise) 95 %   Oxygen Saturation (Exit) 96 %   Rating of Perceived Exertion (Exercise) 11   Perceived Dyspnea (Exercise) 1   Duration Progress to 45 minutes of aerobic exercise without signs/symptoms of physical distress   Intensity THRR unchanged     Progression   Progression Continue to progress workloads to maintain intensity without signs/symptoms of physical distress.     Resistance Training   Training Prescription Yes   Weight orange bands   Reps 10-15   Time 10 Minutes     NuStep   Level 3   Minutes 17   METs 2.4     Arm  Ergometer   Level 3   Minutes 17      History  Smoking Status  . Former Smoker  . Packs/day: 2.00  . Years: 18.00  . Types: Cigarettes  Smokeless Tobacco  . Never Used    Comment: "quit smoking in 1971"    Goals Met:  Exercise tolerated well No report of cardiac concerns or symptoms Strength training completed today  Goals Unmet:  Not Applicable  Comments: Service time is from 1330 to 1530    Dr. Rush Farmer is Medical Director for Pulmonary Rehab at [facility].

## 2017-07-15 ENCOUNTER — Encounter (HOSPITAL_COMMUNITY)
Admission: RE | Admit: 2017-07-15 | Discharge: 2017-07-15 | Disposition: A | Discharge status: Home / Self Care | Payer: Medicare Other | Source: Ambulatory Visit | Attending: Internal Medicine | Admitting: Internal Medicine

## 2017-07-15 VITALS — Wt 275.4 lb

## 2017-07-15 DIAGNOSIS — R0609 Other forms of dyspnea: Secondary | ICD-10-CM | POA: Diagnosis not present

## 2017-07-15 DIAGNOSIS — Z79899 Other long term (current) drug therapy: Secondary | ICD-10-CM | POA: Diagnosis not present

## 2017-07-15 DIAGNOSIS — M1990 Unspecified osteoarthritis, unspecified site: Secondary | ICD-10-CM | POA: Diagnosis not present

## 2017-07-15 DIAGNOSIS — Z7901 Long term (current) use of anticoagulants: Secondary | ICD-10-CM | POA: Diagnosis not present

## 2017-07-15 DIAGNOSIS — Z950 Presence of cardiac pacemaker: Secondary | ICD-10-CM | POA: Diagnosis not present

## 2017-07-15 DIAGNOSIS — Z7951 Long term (current) use of inhaled steroids: Secondary | ICD-10-CM | POA: Diagnosis not present

## 2017-07-15 NOTE — Progress Notes (Signed)
Daily Session Note  Patient Details  Name: [name] [name] MRN: [id number] Date of Birth: 16-Oct-1941 Referring Provider:     Pulmonary Rehab Walk Test from 06/17/2017 in [place]  Referring Provider  Dr. Annamaria Boots      Encounter Date: 07/15/2017  Check In:   Capillary Blood Glucose: No results found for this or any previous visit (from the past 24 hour(s)).      Exercise Prescription Changes - 07/15/17 1500      Response to Exercise   Blood Pressure (Admit) 94/60   Blood Pressure (Exercise) 110/74   Blood Pressure (Exit) 100/64   Heart Rate (Admit) 70 bpm   Heart Rate (Exercise) 77 bpm   Heart Rate (Exit) 92 bpm   Oxygen Saturation (Admit) 93 %   Oxygen Saturation (Exercise) 94 %   Oxygen Saturation (Exit) 96 %   Rating of Perceived Exertion (Exercise) 13   Perceived Dyspnea (Exercise) 1   Duration Progress to 45 minutes of aerobic exercise without signs/symptoms of physical distress   Intensity THRR unchanged     Progression   Progression Continue to progress workloads to maintain intensity without signs/symptoms of physical distress.     Resistance Training   Training Prescription Yes   Weight orange bands   Reps 10-15   Time 10 Minutes     NuStep   Level 4   Minutes 17   METs 2     Arm Ergometer   Level 3   Minutes 17      History  Smoking Status  . Former Smoker  . Packs/day: 2.00  . Years: 18.00  . Types: Cigarettes  Smokeless Tobacco  . Never Used    Comment: "quit smoking in 1971"    Goals Met:  Exercise tolerated well No report of cardiac concerns or symptoms Strength training completed today  Goals Unmet:  Not Applicable  Comments: Service time is from 1330 to 1500    Dr. Rush Farmer is Medical Director for Pulmonary Rehab at [facility].

## 2017-07-17 ENCOUNTER — Encounter (HOSPITAL_COMMUNITY)
Admission: RE | Admit: 2017-07-17 | Discharge: 2017-07-17 | Disposition: A | Discharge status: Home / Self Care | Payer: Medicare Other | Source: Ambulatory Visit | Attending: Internal Medicine | Admitting: Internal Medicine

## 2017-07-17 VITALS — Wt 274.9 lb

## 2017-07-17 DIAGNOSIS — R0609 Other forms of dyspnea: Secondary | ICD-10-CM | POA: Diagnosis not present

## 2017-07-17 DIAGNOSIS — Z7951 Long term (current) use of inhaled steroids: Secondary | ICD-10-CM | POA: Diagnosis not present

## 2017-07-17 DIAGNOSIS — Z79899 Other long term (current) drug therapy: Secondary | ICD-10-CM | POA: Diagnosis not present

## 2017-07-17 DIAGNOSIS — Z950 Presence of cardiac pacemaker: Secondary | ICD-10-CM | POA: Diagnosis not present

## 2017-07-17 DIAGNOSIS — Z7901 Long term (current) use of anticoagulants: Secondary | ICD-10-CM | POA: Diagnosis not present

## 2017-07-17 DIAGNOSIS — M1990 Unspecified osteoarthritis, unspecified site: Secondary | ICD-10-CM | POA: Diagnosis not present

## 2017-07-17 NOTE — Progress Notes (Signed)
Daily Session Note  Patient Details  Name: [name] [name] MRN: [id number] Date of Birth: [birth year] Referring Provider:     Pulmonary Rehab Walk Test from 06/17/2017 in [place]  Referring Provider  Dr. Annamaria Boots      Encounter Date: 07/17/2017  Check In:     Session Check In - 07/17/17 1514      Check-In   Location [facility]   Staff Present Trish Fountain, RN, BSN;Trinda Harlacher Ysidro Evert, RN;Molly diVincenzo, MS, ACSM RCEP, Exercise Physiologist   Supervising physician immediately available to respond to emergencies [facility] immediately available   Physician(s) Dr. Wendee Beavers   Medication changes reported     No   Fall or balance concerns reported    No   Tobacco Cessation No Change   Warm-up and Cool-down Performed as group-led instruction   Resistance Training Performed Yes   VAD Patient? No     Pain Assessment   Currently in Pain? Yes   Pain Score 2    Pain Location Groin   Pain Descriptors / Indicators Aching   Pain Type Chronic pain   Pain Onset More than a month ago   Pain Frequency Constant   Multiple Pain Sites No      Capillary Blood Glucose: No results found for this or any previous visit (from the past 24 hour(s)).      Exercise Prescription Changes - 07/17/17 1500      Response to Exercise   Blood Pressure (Admit) 90/50   Blood Pressure (Exercise) 98/50   Blood Pressure (Exit) 100/60   Heart Rate (Admit) 80 bpm   Heart Rate (Exercise) 94 bpm   Heart Rate (Exit) 75 bpm   Oxygen Saturation (Admit) 96 %   Oxygen Saturation (Exercise) 90 %   Oxygen Saturation (Exit) 95 %   Rating of Perceived Exertion (Exercise) 15   Perceived Dyspnea (Exercise) 3   Duration Progress to 45 minutes of aerobic exercise without signs/symptoms of physical distress   Intensity THRR unchanged     Progression   Progression Continue to progress workloads to maintain intensity without signs/symptoms of physical distress.     Resistance Training   Training Prescription Yes   Weight orange bands   Reps 10-15   Time 10 Minutes      History  Smoking Status  . Former Smoker  . Packs/day: 2.00  . Years: 18.00  . Types: Cigarettes  Smokeless Tobacco  . Never Used    Comment: "quit smoking in 1971"    Goals Met:  Exercise tolerated well No report of cardiac concerns or symptoms Strength training completed today  Goals Unmet:  Not Applicable  Comments: Service time is from 1330 to 1500    Dr. Rush Farmer is Medical Director for Pulmonary Rehab at [facility].

## 2017-07-22 ENCOUNTER — Telehealth (HOSPITAL_COMMUNITY): Payer: Self-pay | Admitting: Family Medicine

## 2017-07-22 ENCOUNTER — Encounter (HOSPITAL_COMMUNITY): Payer: Medicare Other

## 2017-07-24 ENCOUNTER — Encounter (HOSPITAL_COMMUNITY)
Admission: RE | Admit: 2017-07-24 | Discharge: 2017-07-24 | Disposition: A | Discharge status: Home / Self Care | Payer: Medicare Other | Source: Ambulatory Visit | Attending: Internal Medicine | Admitting: Internal Medicine

## 2017-07-24 DIAGNOSIS — M1990 Unspecified osteoarthritis, unspecified site: Secondary | ICD-10-CM | POA: Diagnosis not present

## 2017-07-24 DIAGNOSIS — Z7951 Long term (current) use of inhaled steroids: Secondary | ICD-10-CM | POA: Diagnosis not present

## 2017-07-24 DIAGNOSIS — Z7901 Long term (current) use of anticoagulants: Secondary | ICD-10-CM | POA: Diagnosis not present

## 2017-07-24 DIAGNOSIS — R0609 Other forms of dyspnea: Secondary | ICD-10-CM | POA: Diagnosis not present

## 2017-07-24 DIAGNOSIS — Z950 Presence of cardiac pacemaker: Secondary | ICD-10-CM | POA: Diagnosis not present

## 2017-07-24 DIAGNOSIS — Z79899 Other long term (current) drug therapy: Secondary | ICD-10-CM | POA: Diagnosis not present

## 2017-07-24 LAB — CUP PACEART REMOTE DEVICE CHECK
Battery Remaining Longevity: 80 mo
Battery Voltage: 3.01 V
Brady Statistic RA Percent Paced: 8.95 %
Brady Statistic RV Percent Paced: 23.89 %
Date Time Interrogation Session: 20180806145959
Implantable Lead Implant Date: 20151103
Implantable Lead Location: 753859
Implantable Lead Location: 753860
Implantable Lead Model: 5076
Implantable Lead Model: 5076
Implantable Pulse Generator Implant Date: 20151103
Lead Channel Impedance Value: 551 Ohm
Lead Channel Pacing Threshold Amplitude: 0.5 V
Lead Channel Pacing Threshold Pulse Width: 0.4 ms
Lead Channel Sensing Intrinsic Amplitude: 2 mV
Lead Channel Setting Pacing Amplitude: 2 V
Lead Channel Setting Sensing Sensitivity: 2.8 mV
MDC IDC LEAD IMPLANT DT: 20151103
MDC IDC MSMT LEADCHNL RA IMPEDANCE VALUE: 399 Ohm
MDC IDC MSMT LEADCHNL RA IMPEDANCE VALUE: 437 Ohm
MDC IDC MSMT LEADCHNL RA SENSING INTR AMPL: 2 mV
MDC IDC MSMT LEADCHNL RV IMPEDANCE VALUE: 513 Ohm
MDC IDC MSMT LEADCHNL RV PACING THRESHOLD AMPLITUDE: 0.5 V
MDC IDC MSMT LEADCHNL RV PACING THRESHOLD PULSEWIDTH: 0.4 ms
MDC IDC MSMT LEADCHNL RV SENSING INTR AMPL: 7.75 mV
MDC IDC MSMT LEADCHNL RV SENSING INTR AMPL: 7.75 mV
MDC IDC SET LEADCHNL RA PACING AMPLITUDE: 1.5 V
MDC IDC SET LEADCHNL RV PACING PULSEWIDTH: 0.4 ms
MDC IDC STAT BRADY AP VP PERCENT: 0.01 %
MDC IDC STAT BRADY AP VS PERCENT: 14.35 %
MDC IDC STAT BRADY AS VP PERCENT: 23.16 %
MDC IDC STAT BRADY AS VS PERCENT: 62.49 %

## 2017-07-24 NOTE — Progress Notes (Signed)
Daily Session Note  Patient Details  Name: [name] [name] MRN: [id number] Date of Birth: 16-May-1941 Referring Provider:     Pulmonary Rehab Walk Test from 06/17/2017 in [place]  Referring Provider  Dr. Annamaria Boots      Encounter Date: 07/24/2017  Check In:   Capillary Blood Glucose: No results found for this or any previous visit (from the past 24 hour(s)).      Exercise Prescription Changes - 07/24/17 1605      Response to Exercise   Blood Pressure (Admit) 104/70   Blood Pressure (Exercise) 108/64   Blood Pressure (Exit) 104/60   Heart Rate (Admit) 74 bpm   Heart Rate (Exercise) 88 bpm   Heart Rate (Exit) 86 bpm   Oxygen Saturation (Admit) 93 %   Oxygen Saturation (Exercise) 94 %   Oxygen Saturation (Exit) 96 %   Rating of Perceived Exertion (Exercise) 13   Perceived Dyspnea (Exercise) 1   Duration Progress to 45 minutes of aerobic exercise without signs/symptoms of physical distress   Intensity THRR unchanged     Progression   Progression Continue to progress workloads to maintain intensity without signs/symptoms of physical distress.     Resistance Training   Training Prescription Yes   Weight orange bands   Reps 10-15   Time 10 Minutes     NuStep   Level 4   Minutes 17   METs 1.4     Arm Ergometer   Level 5   Minutes 17      History  Smoking Status  . Former Smoker  . Packs/day: 2.00  . Years: 18.00  . Types: Cigarettes  Smokeless Tobacco  . Never Used    Comment: "quit smoking in 1971"    Goals Met:  Independence with exercise equipment Improved SOB with ADL's Using PLB without cueing & demonstrates good technique Exercise tolerated well No report of cardiac concerns or symptoms Strength training completed today  Goals Unmet:  Not Applicable  Comments: Service time is from 1330 to 1525   Dr. Rush Farmer is Medical Director for Pulmonary Rehab at [facility].

## 2017-07-28 ENCOUNTER — Other Ambulatory Visit: Payer: Self-pay | Admitting: Cardiovascular Disease

## 2017-07-28 ENCOUNTER — Encounter: Payer: Self-pay | Admitting: Cardiology

## 2017-07-29 ENCOUNTER — Encounter (HOSPITAL_COMMUNITY)
Admission: RE | Admit: 2017-07-29 | Discharge: 2017-07-29 | Disposition: A | Discharge status: Home / Self Care | Payer: Medicare Other | Source: Ambulatory Visit | Attending: Internal Medicine | Admitting: Internal Medicine

## 2017-07-30 ENCOUNTER — Telehealth: Payer: Self-pay | Admitting: Cardiovascular Disease

## 2017-07-30 NOTE — Telephone Encounter (Signed)
Returned call to patient advised office out of Eliquis samples.

## 2017-07-30 NOTE — Progress Notes (Signed)
Pulmonary Individual Treatment Plan  Patient Details  Name: [name] [name] MRN: [id number] Date of Birth: Nov 24, 1941 Referring Provider:     Pulmonary Rehab Walk Test from 06/17/2017 in [place]  Referring Provider  Dr. Annamaria Boots      Initial Encounter Date:    Pulmonary Rehab Walk Test from 06/17/2017 in [place]  Date  06/19/17  Referring Provider  Dr. Annamaria Boots      Visit Diagnosis: Dyspnea on exertion  Patient's Home Medications on Admission:   Current Outpatient Prescriptions:  .  apixaban (ELIQUIS) 5 MG TABS tablet, Take 1 tablet (5 mg total) by mouth 2 (two) times daily., Disp: 28 tablet, Rfl: 0 .  Biotin 5000 MCG CAPS, Take 5,000 mcg by mouth daily. , Disp: , Rfl:  .  BREO ELLIPTA 100-25 MCG/INH AEPB, Take 1 puff by mouth daily., Disp: , Rfl:  .  Calcium Carbonate-Vit D-Min (CALCIUM 1200 PO), Take 2,400 mg by mouth daily. , Disp: , Rfl:  .  cholecalciferol (VITAMIN D) 1000 UNITS tablet, Take 1,000 Units by mouth daily. , Disp: , Rfl:  .  Co-Enzyme Q10 100 MG CAPS, Take 100 mg by mouth daily. , Disp: , Rfl:  .  diazepam (VALIUM) 5 MG tablet, Take 1 tablet (5 mg total) by mouth as needed for anxiety., Disp: 30 tablet, Rfl: 1 .  ELIQUIS 5 MG TABS tablet, TAKE ONE TABLET BY MOUTH TWICE DAILY, Disp: 180 tablet, Rfl: 1 .  folic acid (FOLVITE) 379 MCG tablet, Take 400 mcg by mouth daily., Disp: , Rfl:  .  glucosamine-chondroitin 500-400 MG tablet, Take 1 tablet by mouth daily. , Disp: , Rfl:  .  loratadine (KLS ALLERCLEAR) 10 MG tablet, Take 10 mg by mouth daily., Disp: , Rfl:  .  MAGNESIUM CITRATE PO, Take 250 mg by mouth daily. 1 tab daily, Disp: , Rfl:  .  metoprolol tartrate (LOPRESSOR) 25 MG tablet, Take 1 tablet (25 mg total) by mouth 2 (two) times daily., Disp: 180 tablet, Rfl: 2 .  pantoprazole (PROTONIX) 40 MG tablet, TAKE 1 TABLET BY MOUTH DAILY, Disp: 60 tablet, Rfl: 1 .  Resveratrol 250 MG CAPS, Take 250 mg by  mouth daily., Disp: , Rfl:  .  sertraline (ZOLOFT) 100 MG tablet, Take 1 tablet (100 mg total) by mouth daily., Disp: 90 tablet, Rfl: 0 .  Simethicone (GAS-X PO), Take 2 tablets by mouth daily as needed (bloating)., Disp: , Rfl:   Past Medical History: Past Medical History:  Diagnosis Date  . Alpha-1-antitrypsin deficiency carrier ([place])   . Arthritis    "all over"  . Atrial fibrillation ([place])   . Complication of anesthesia    "I woke up during 2 different procedures" (10/04/2014)  . Frequent UTI   . GERD (gastroesophageal reflux disease)   . Hypertension   . MVP (mitral valve prolapse)   . Obesity   . Presence of permanent cardiac pacemaker   . PVC's (premature ventricular contractions)   . 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 Stroke [facility]) 01/2014   denies deficits on 10/04/2014  . Tachy-brady syndrome ([place]) 10/2014    Tobacco Use: History  Smoking Status  . Former Smoker  . Packs/day: 2.00  . Years: 18.00  . Types: Cigarettes  Smokeless Tobacco  . Never Used    Comment: "quit smoking in 1971"    Labs: Recent Review [employer]  for ITP Cardiac and Pulmonary Rehab Latest Ref Rng & Units 03/08/2014 03/09/2014 10/25/2014 07/15/2016 01/15/2017   Cholestrol 0 - 200 mg/dL - 152 184 163 182   LDLCALC 0 - 99 mg/dL - 77 138 - 94   LDLDIRECT mg/dL - - - 81.0 -   HDL >39.00 mg/dL - 58 46 45.70 54.60   Trlycerides 0.0 - 149.0 mg/dL - 87 210(A) 208.0(H) 168.0(H)   Hemoglobin A1c <5.7 % - 5.9(H) - - -   TCO2 0 - 100 mmol/L 26 - - - -      Capillary Blood Glucose: Lab Results  Component Value Date   GLUCAP 103 (H) 03/09/2014   GLUCAP 103 (H) 03/09/2014   GLUCAP 112 (H) 03/09/2014       POCT Glucose    Row Name 07/15/17 1555             POCT Blood Glucose   Pre-Exercise 220 mg/dL       Post-Exercise 165 mg/dL          Pulmonary Assessment Scores:     Pulmonary Assessment Scores    Row Name 06/11/17 0923  06/19/17 0728       ADL UCSD   ADL Phase Entry Entry    SOB Score total 63  -      CAT Score   CAT Score 21  Entry  -      mMRC Score   mMRC Score  - 4       Pulmonary Function Assessment:     Pulmonary Function Assessment - 06/09/17 1315      Breath   Bilateral Breath Sounds Clear   Shortness of Breath Yes;Limiting activity;Fear of Shortness of Breath      Exercise Target Goals:    Exercise Program Goal: Individual exercise prescription set with THRR, safety & activity barriers. Participant demonstrates ability to understand and report RPE using BORG scale, to self-measure pulse accurately, and to acknowledge the importance of the exercise prescription.  Exercise Prescription Goal: Starting with aerobic activity 30 plus minutes a day, 3 days per week for initial exercise prescription. Provide home exercise prescription and guidelines that participant acknowledges understanding prior to discharge.  Activity Barriers & Risk Stratification:   6 Minute Walk:     6 Minute Walk    Row Name 06/19/17 0723         6 Minute Walk   Phase Initial     Distance 800 feet     Walk Time -  4 minutes 50 seconds total     # of Rest Breaks 2  1st: 40 seconds 2nd: 30 seconds     MPH 1.5     METS 2.15     RPE 13     Perceived Dyspnea  3     Symptoms Yes (comment)     Comments used wheelchair/5/10 hip pain/limited by shortness of breath     Resting HR 90 bpm     Resting BP 105/66     Max Ex. HR 127 bpm     Max Ex. BP 105/65       Interval HR   Baseline HR (retired) 90     1 Minute HR 144     2 Minute HR 127     3 Minute HR 102     4 Minute HR 98     5 Minute HR 98     6 Minute HR 102     2 Minute Post HR  95     Interval Heart Rate? Yes       Interval Oxygen   Interval Oxygen? Yes     Baseline Oxygen Saturation % 94 %     Resting Liters of Oxygen 0 L     1 Minute Oxygen Saturation % 93 %     1 Minute Liters of Oxygen 0 L     2 Minute Oxygen Saturation % 93 %      2 Minute Liters of Oxygen 0 L     3 Minute Oxygen Saturation % 93 %     3 Minute Liters of Oxygen 0 L     4 Minute Oxygen Saturation % 94 %     4 Minute Liters of Oxygen 0 L     5 Minute Oxygen Saturation % 92 %     5 Minute Liters of Oxygen 0 L     6 Minute Oxygen Saturation % 92 %     6 Minute Liters of Oxygen 0 L     2 Minute Post Oxygen Saturation % 93 %     2 Minute Post Liters of Oxygen 0 L        Oxygen Initial Assessment:     Oxygen Initial Assessment - 06/19/17 0726      Initial 6 min Walk   Oxygen Used None   Resting Oxygen Saturation  94 %   Exercise Oxygen Saturation  during 6 min walk 92 %     Program Oxygen Prescription   Program Oxygen Prescription None      Oxygen Re-Evaluation:     Oxygen Re-Evaluation    Row Name 07/01/17 1924 07/30/17 2103           Program Oxygen Prescription   Program Oxygen Prescription None None        Home Oxygen   Home Oxygen Device None None      Sleep Oxygen Prescription CPAP CPAP      Liters per minute 0  -      Home Exercise Oxygen Prescription None None      Home at Rest Exercise Oxygen Prescription None None      Compliance with Home Oxygen Use Yes  -         Oxygen Discharge (Final Oxygen Re-Evaluation):     Oxygen Re-Evaluation - 07/30/17 2103      Program Oxygen Prescription   Program Oxygen Prescription None     Home Oxygen   Home Oxygen Device None   Sleep Oxygen Prescription CPAP   Home Exercise Oxygen Prescription None   Home at Rest Exercise Oxygen Prescription None      Initial Exercise Prescription:     Initial Exercise Prescription - 06/19/17 0700      Date of Initial Exercise RX and Referring Provider   Date 06/19/17   Referring Provider Dr. Annamaria Boots     Recumbant Bike   Level 1   Minutes 17     NuStep   Level 2   Minutes 17   METs 1.5     Track   Laps 4   Minutes 17     Prescription Details   Frequency (times per week) 2   Duration Progress to 45 minutes of aerobic  exercise without signs/symptoms of physical distress     Intensity   THRR 40-80% of Max Heartrate 58-115   Ratings of Perceived Exertion 11-13   Perceived Dyspnea 0-4     Progression   Progression Continue  progressive overload as per policy without signs/symptoms or physical distress.     Resistance Training   Training Prescription Yes   Weight orange bands   Reps 10-15      Perform Capillary Blood Glucose checks as needed.  Exercise Prescription Changes:     Exercise Prescription Changes    Row Name 06/24/17 1500 06/26/17 1500 07/01/17 1500 07/03/17 1500 07/08/17 1500     Response to Exercise   Blood Pressure (Admit) 104/60 120/60 100/56 96/60 110/64   Blood Pressure (Exercise) 124/70 130/84 122/82 130/86 118/70   Blood Pressure (Exit) 100/60 108/74 104/60 126/60 108/78   Heart Rate (Admit) 79 bpm 78 bpm 97 bpm 79 bpm 72 bpm   Heart Rate (Exercise) 105 bpm 94 bpm 107 bpm 105 bpm 94 bpm   Heart Rate (Exit) 83 bpm 70 bpm 79 bpm 71 bpm 76 bpm   Oxygen Saturation (Admit) 94 % 94 % 96 % 96 % 94 %   Oxygen Saturation (Exercise) 94 % 93 % 93 % 87 %  Sat increased to 92% with rest 93 %  Sat increased to 92% with rest   Oxygen Saturation (Exit) 93 % 95 % 94 % 93 % 96 %   Rating of Perceived Exertion (Exercise) _0 Perceived Dyspnea (Exercise) _1 Duration Progress to 45 minutes of aerobic exercise without signs/symptoms of physical distress Progress to 45 minutes of aerobic exercise without signs/symptoms of physical distress Progress to 45 minutes of aerobic exercise without signs/symptoms of physical distress Progress to 45 minutes of aerobic exercise without signs/symptoms of physical distress Progress to 45 minutes of aerobic exercise without signs/symptoms of physical distress   Intensity Other (comment)  40-8-% of HRR THRR unchanged THRR unchanged THRR unchanged THRR unchanged     Progression   Progression Continue to progress workloads to maintain  intensity without signs/symptoms of physical distress. Continue to progress workloads to maintain intensity without signs/symptoms of physical distress. Continue to progress workloads to maintain intensity without signs/symptoms of physical distress. Continue to progress workloads to maintain intensity without signs/symptoms of physical distress. Continue to progress workloads to maintain intensity without signs/symptoms of physical distress.     Resistance Training   Training Prescription _2    Weight _3    Reps 10-15 10-15 10-15 10-15 10-15   Time 10 Minutes 10 Minutes 10 Minutes 10 Minutes 10 Minutes     NuStep   Level _4 - 2   Minutes _5 - 17   METs 1.9 1.1 1.9  - 1.8     Arm Ergometer   Level _6 Minutes _7 Track   Laps 5  - _8 Minutes 17  - _9 Row Name 07/10/17 1500 07/15/17 1500 07/17/17 1500 07/24/17 1605       Response to Exercise   Blood Pressure (Admit) 96/64 94/60 90/50 104/70    Blood Pressure (Exercise) 112/86 110/74 98/50 108/64    Blood Pressure (Exit) 100/60 100/64 100/60 104/60    Heart Rate (Admit) 75 bpm 70 bpm 80 bpm 74 bpm    Heart Rate (Exercise) 88 bpm 77 bpm 94 bpm 88 bpm    Heart Rate (Exit) 74 bpm 92 bpm 75 bpm 86 bpm  Oxygen Saturation (Admit) 96 % 93 % 96 % 93 %    Oxygen Saturation (Exercise) 95 % 94 % 90 % 94 %    Oxygen Saturation (Exit) 96 % 96 % 95 % 96 %    Rating of Perceived Exertion (Exercise) _0 Perceived Dyspnea (Exercise) _1 Duration Progress to 45 minutes of aerobic exercise without signs/symptoms of physical distress Progress to 45 minutes of aerobic exercise without signs/symptoms of physical distress Progress to 45 minutes of aerobic exercise without signs/symptoms of physical distress Progress to 45 minutes of aerobic exercise without signs/symptoms of physical distress    Intensity THRR  unchanged THRR unchanged THRR unchanged THRR unchanged      Progression   Progression Continue to progress workloads to maintain intensity without signs/symptoms of physical distress. Continue to progress workloads to maintain intensity without signs/symptoms of physical distress. Continue to progress workloads to maintain intensity without signs/symptoms of physical distress. Continue to progress workloads to maintain intensity without signs/symptoms of physical distress.      Resistance Training   Training Prescription Yes Yes Yes Yes    Weight orange bands orange bands orange bands orange bands    Reps 10-15 10-15 10-15 10-15    Time 10 Minutes 10 Minutes 10 Minutes 10 Minutes      NuStep   Level 3 4  - 4    Minutes 17 17  - 17    METs 2.4 2  - 1.4      Arm Ergometer   Level 3 3  - 5    Minutes 17 17  - 17       Exercise Comments:   Exercise Goals and Review:   Exercise Goals Re-Evaluation :     Exercise Goals Re-Evaluation    Row Name 06/27/17 1042 07/29/17 1005           Exercise Goal Re-Evaluation   Exercise Goals Review Increase Physical Activity;Increase Strenth and Stamina Increase Physical Activity;Increase Strength and Stamina;Able to understand and use Dyspnea scale;Able to understand and use rate of perceived exertion (RPE) scale;Knowledge and understanding of Target Heart Rate Range (THRR);Understanding of Exercise Prescription      Comments Patient has only attended two exercise sessions. Will cont. to monitor and progress as able.  Patient is progressing slowly in program. She is limited by orthopedic issues. Will cont. to progress as able.      Expected Outcomes Through exercising at rehab and at home, patient will increase physical strength and stamina and find ADL's easier to perform.  Through exercising at rehab and at home, patient will increase physical strength and stamina and find ADL's easier to perform.          Discharge Exercise Prescription  (Final Exercise Prescription Changes):     Exercise Prescription Changes - 07/24/17 1605      Response to Exercise   Blood Pressure (Admit) 104/70   Blood Pressure (Exercise) 108/64   Blood Pressure (Exit) 104/60   Heart Rate (Admit) 74 bpm   Heart Rate (Exercise) 88 bpm   Heart Rate (Exit) 86 bpm   Oxygen Saturation (Admit) 93 %   Oxygen Saturation (Exercise) 94 %   Oxygen Saturation (Exit) 96 %   Rating of Perceived Exertion (Exercise) 13   Perceived Dyspnea (Exercise) 1   Duration Progress to 45 minutes of aerobic exercise without signs/symptoms of physical distress   Intensity THRR unchanged  Progression   Progression Continue to progress workloads to maintain intensity without signs/symptoms of physical distress.     Resistance Training   Training Prescription Yes   Weight orange bands   Reps 10-15   Time 10 Minutes     NuStep   Level 4   Minutes 17   METs 1.4     Arm Ergometer   Level 5   Minutes 17      Nutrition:  Target Goals: Understanding of nutrition guidelines, daily intake of sodium <1529m, cholesterol <205m calories 30% from fat and 7% or less from saturated fats, daily to have 5 or more servings of fruits and vegetables.  Biometrics:    Nutrition Therapy Plan and Nutrition Goals:     Nutrition Therapy & Goals - 06/19/17 0842      Nutrition Therapy   Diet General, Healthful     Personal Nutrition Goals   Nutrition Goal Identify food quantities necessary to achieve wt loss of  -2# per week to a goal wt loss of 2.7-10.9 kg (6-24 lb) at graduation from pulmonary rehab.     Intervention Plan   Intervention Prescribe, educate and counsel regarding individualized specific dietary modifications aiming towards targeted core components such as weight, hypertension, lipid management, diabetes, heart failure and other comorbidities.   Expected Outcomes Short Term Goal: Understand basic principles of dietary content, such as calories, fat, sodium,  cholesterol and nutrients.;Long Term Goal: Adherence to prescribed nutrition plan.      Nutrition Discharge: Rate Your Plate Scores:     Nutrition Assessments - 06/19/17 0836      Rate Your Plate Scores   Pre Score 46      Nutrition Goals Re-Evaluation:   Nutrition Goals Discharge (Final Nutrition Goals Re-Evaluation):   Psychosocial: Target Goals: Acknowledge presence or absence of significant depression and/or stress, maximize coping skills, provide positive support system. Participant is able to verbalize types and ability to use techniques and skills needed for reducing stress and depression.  Initial Review & Psychosocial Screening:     Initial Psych Review & Screening - 06/09/17 1328      Initial Review   Current issues with None Identified     Family Dynamics   Good Support System? Yes     Barriers   Psychosocial barriers to participate in program There are no identifiable barriers or psychosocial needs.     Screening Interventions   Interventions Encouraged to exercise      Quality of Life Scores:   PHQ-9: Recent Review Flowsheet Data    [facility] 06/09[facility]2017 01/15/2017 07/15/2016   Decreased Interest 3 0 0   Down, Depressed, Hopeless - 0  0   PHQ - 2 Score 3 0 0   Altered sleeping 0 - -   Tired, decreased energy 3 - -   Change in appetite 3 - -   Feeling bad or failure about yourself  3 - -   Trouble concentrating 0 - -   Moving slowly or fidgety/restless 0 - -   Suicidal thoughts 0 - -   PHQ-9 Score 12 - -   Difficult doing work/chores Not difficult at all - -     Interpretation of Total Score  Total Score Depression Severity:  1-4 = Minimal depression, 5-9 = Mild depression, 10-14 = Moderate depression, 15-19 = Moderately severe depression, 20-27 = Severe depression   Psychosocial Evaluation and Intervention:     Psychosocial Evaluation - 06/09/17 1330  Psychosocial Evaluation & Interventions   Interventions Encouraged  to exercise with the program and follow exercise prescription   Comments dealing depression, but is working on making herself happy.   Continue Psychosocial Services  No Follow up required      Psychosocial Re-Evaluation:     Psychosocial Re-Evaluation    [place] Name 07/01/17 1926 07/30/17 2105           Psychosocial Re-Evaluation   Current issues with None Identified None Identified      Expected Outcomes  - patient will remain free from psychosocial barriers to participation      Interventions  - Encouraged to attend Pulmonary Rehabilitation for the exercise      Continue Psychosocial Services   - No Follow up required         Psychosocial Discharge (Final Psychosocial Re-Evaluation):     Psychosocial Re-Evaluation - 07/30/17 2105      Psychosocial Re-Evaluation   Current issues with None Identified   Expected Outcomes patient will remain free from psychosocial barriers to participation   Interventions Encouraged to attend Pulmonary Rehabilitation for the exercise   Continue Psychosocial Services  No Follow up required      Education: Education Goals: Education classes will be provided on a weekly basis, covering required topics. Participant will state understanding/return demonstration of topics presented.  Learning Barriers/Preferences:     Learning Barriers/Preferences - 06/09/17 1314      Learning Barriers/Preferences   Learning Barriers None   Learning Preferences Pictoral      Education Topics: Risk Factor Reduction:  -Group instruction that is supported by a PowerPoint presentation. Instructor discusses the definition of a risk factor, different risk factors for pulmonary disease, and how the heart and lungs work together.     PULMONARY REHAB OTHER RESPIRATORY from 07/24/2017 in [place]  Date  06/26/17  Educator  ep  Instruction Review Code  2- meets goals/outcomes      Nutrition for Pulmonary Patient:  -Group  instruction provided by PowerPoint slides, verbal discussion, and written materials to support subject matter. The instructor gives an explanation and review of healthy diet recommendations, which includes a discussion on weight management, recommendations for fruit and vegetable consumption, as well as protein, fluid, caffeine, fiber, sodium, sugar, and alcohol. Tips for eating when patients are short of breath are discussed.   PULMONARY REHAB OTHER RESPIRATORY from 07/24/2017 in [place]  Date  07/24/17  Educator  [place]na  Instruction Review Code  2- meets goals/outcomes      Pursed Lip Breathing:  -Group instruction that is supported by demonstration and informational handouts. Instructor discusses the benefits of pursed lip and diaphragmatic breathing and detailed demonstration on how to preform both.     Oxygen Safety:  -Group instruction provided by PowerPoint, verbal discussion, and written material to support subject matter. There is an overview of "What is Oxygen" and "Why do we need it".  Instructor also reviews how to create a safe environment for oxygen use, the importance of using oxygen as prescribed, and the risks of noncompliance. There is a brief discussion on traveling with oxygen and resources the patient may utilize.   PULMONARY REHAB OTHER RESPIRATORY from 07/24/2017 in [place]  Date  07/10/17  Educator  Truddie Crumble  Instruction Review Code  2- meets goals/outcomes      Oxygen Equipment:  -Group instruction provided by [facility] Staff utilizing handouts, written materials, and  equipment demonstrations.   Signs and Symptoms:  -Group instruction provided by written material and verbal discussion to support subject matter. Warning signs and symptoms of infection, stroke, and heart attack are reviewed and when to call the physician/911 reinforced. Tips for preventing the spread of infection  discussed.   Advanced Directives:  -Group instruction provided by verbal instruction and written material to support subject matter. Instructor reviews Advanced Directive laws and proper instruction for filling out document.   Pulmonary Video:  -Group video education that reviews the importance of medication and oxygen compliance, exercise, good nutrition, pulmonary hygiene, and pursed lip and diaphragmatic breathing for the pulmonary patient.   Exercise for the Pulmonary Patient:  -Group instruction that is supported by a PowerPoint presentation. Instructor discusses benefits of exercise, core components of exercise, frequency, duration, and intensity of an exercise routine, importance of utilizing pulse oximetry during exercise, safety while exercising, and options of places to exercise outside of rehab.     Pulmonary Medications:  -Verbally interactive group education provided by instructor with focus on inhaled medications and proper administration.   Anatomy and Physiology of the Respiratory System and Intimacy:  -Group instruction provided by PowerPoint, verbal discussion, and written material to support subject matter. Instructor reviews respiratory cycle and anatomical components of the respiratory system and their functions. Instructor also reviews differences in obstructive and restrictive respiratory diseases with examples of each. Intimacy, Sex, and Sexuality differences are reviewed with a discussion on how relationships can change when diagnosed with pulmonary disease. Common sexual concerns are reviewed.   MD DAY -A group question and answer session with a medical doctor that allows participants to ask questions that relate to their pulmonary disease state.   OTHER EDUCATION -Group or individual verbal, written, or video instructions that support the educational goals of the pulmonary rehab program.   Knowledge Questionnaire Score:     Knowledge Questionnaire Score -  06/11/17 0923      Knowledge Questionnaire Score   Pre Score 12/13      Core Components/Risk Factors/Patient Goals at Admission:     Personal Goals and Risk Factors at Admission - 06/09/17 1325      Core Components/Risk Factors/Patient Goals on Admission    Weight Management Obesity;Yes   Intervention Weight Management: Develop a combined nutrition and exercise program designed to reach desired caloric intake, while maintaining appropriate intake of nutrient and fiber, sodium and fats, and appropriate energy expenditure required for the weight goal.   Admit Weight 276 lb 0.3 oz (125.2 kg)   Goal Weight: Short Term 271 lb 2.7 oz (123 kg)   Goal Weight: Long Term 264 lb 8.8 oz (120 kg)   Expected Outcomes Short Term: Continue to assess and modify interventions until short term weight is achieved   Improve shortness of breath with ADL's Yes   Intervention Provide education, individualized exercise plan and daily activity instruction to help decrease symptoms of SOB with activities of daily living.   Expected Outcomes Short Term: Achieves a reduction of symptoms when performing activities of daily living.   Develop more efficient breathing techniques such as purse lipped breathing and diaphragmatic breathing; and practicing self-pacing with activity Yes   Intervention Provide education, demonstration and support about specific breathing techniuqes utilized for more efficient breathing. Include techniques such as pursed lipped breathing, diaphragmatic breathing and self-pacing activity.   Expected Outcomes Short Term: Participant will be able to demonstrate and use breathing techniques as needed throughout daily activities.  Core Components/Risk Factors/Patient Goals Review:      Goals and Risk Factor Review    Row Name 07/01/17 1924 07/30/17 2104           Core Components/Risk Factors/Patient Goals Review   Personal Goals Review  - Weight Management/Obesity;Improve shortness of  breath with ADL's;Develop more efficient breathing techniques such as purse lipped breathing and diaphragmatic breathing and practicing self-pacing with activity.      Review patient has only attended 3 exercise sessions since admission and is too soon to evaluate progression towards rehab goals patient doing well in rehab. tolerating workload increases and utilizing PLB for her shortness of breath      Expected Outcomes see admission expected outcomes see admission expected outcomes         Core Components/Risk Factors/Patient Goals at Discharge (Final Review):      Goals and Risk Factor Review - 07/30/17 2104      Core Components/Risk Factors/Patient Goals Review   Personal Goals Review Weight Management/Obesity;Improve shortness of breath with ADL's;Develop more efficient breathing techniques such as purse lipped breathing and diaphragmatic breathing and practicing self-pacing with activity.   Review patient doing well in rehab. tolerating workload increases and utilizing PLB for her shortness of breath   Expected Outcomes see admission expected outcomes      ITP Comments:   Comments: ITP REVIEW Pt is making slow expected progress toward pulmonary rehab goals after completing 9 sessions. Recommend continued exercise, life style modification, education, and utilization of breathing techniques to increase stamina and strength and decrease shortness of breath with exertion.

## 2017-07-30 NOTE — Telephone Encounter (Signed)
New message       Patient calling the office for samples of medication:   1.  What medication and dosage are you requesting samples for? eliquis 14m  2.  Are you currently out of this medication? Almost out

## 2017-07-31 ENCOUNTER — Encounter (HOSPITAL_COMMUNITY)
Admission: RE | Admit: 2017-07-31 | Discharge: 2017-07-31 | Disposition: A | Discharge status: Home / Self Care | Payer: Medicare Other | Source: Ambulatory Visit | Attending: Internal Medicine | Admitting: Internal Medicine

## 2017-07-31 VITALS — Wt 274.5 lb

## 2017-07-31 DIAGNOSIS — Z7901 Long term (current) use of anticoagulants: Secondary | ICD-10-CM | POA: Diagnosis not present

## 2017-07-31 DIAGNOSIS — Z950 Presence of cardiac pacemaker: Secondary | ICD-10-CM | POA: Diagnosis not present

## 2017-07-31 DIAGNOSIS — Z79899 Other long term (current) drug therapy: Secondary | ICD-10-CM | POA: Diagnosis not present

## 2017-07-31 DIAGNOSIS — Z7951 Long term (current) use of inhaled steroids: Secondary | ICD-10-CM | POA: Diagnosis not present

## 2017-07-31 DIAGNOSIS — M1990 Unspecified osteoarthritis, unspecified site: Secondary | ICD-10-CM | POA: Diagnosis not present

## 2017-07-31 DIAGNOSIS — R0609 Other forms of dyspnea: Secondary | ICD-10-CM | POA: Diagnosis not present

## 2017-07-31 NOTE — Progress Notes (Signed)
[name] [name] 76 y.o. female  DOB: [birth year] MRN: [id number]           Nutrition Note 1. Dyspnea on exertion   Spoke with pt. Pt is obese and wants to lose wt. Pt states she gained 53 lb over the past 2 years "due to divorce and her daughter has a terminal illness." Pt has an appt in Sept with Dr. Leafy Ro, who specializes in bariatics and wt loss. There are some ways the pt can make her eating habits healthier Pt's Rate Your Plate results reviewed with pt. Per discussion, barriers to pt eating healthier and losing wt include pt is an emotional eater. Techniques to help pt eat more mindfully discussed. Pt states she does not avoid salty food "because I never thought I needed to." The role of sodium in lung disease reviewed with pt. Pt expressed understanding of the information reviewed via feedback method.    Nutrition Diagnosis ? Food-and nutrition-related knowledge deficit related to lack of exposure to information as related to diagnosis of pulmonary disease ? 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 Identify food quantities necessary to achieve wt loss of  -2# per week to a goal wt loss of 2.7-10.9 kg (6-24 lb) at graduation from pulmonary rehab.  Plan:  Pt to attend Pulmonary Nutrition class - met; 07/24/17 Will provide client-centered nutrition education as part of interdisciplinary care.   Monitor and evaluate progress toward nutrition goal with team.  Monitor and Evaluate progress toward nutrition goal with team.   Derek Mound, M.Ed, RD, LDN, CDE 07/31/2017 3:31 PM

## 2017-07-31 NOTE — Progress Notes (Signed)
Daily Session Note  Patient Details  Name: [name] [name] MRN: [id number] Date of Birth: 03/07/1941 Referring Provider:     Pulmonary Rehab Walk Test from 06/17/2017 in [facility]  Referring Provider  Dr. Young      Encounter Date: 07/31/2017  Check In:     Session Check In - 07/31/17 1330      Check-In   Location [facility]   Staff Present Annedrea Stackhouse, RN, MHA;Portia Payne, RN, BSN; , RN   Supervising physician immediately available to respond to emergencies [facility] immediately available   Physician(s) Dr. Vega   Medication changes reported     No   Fall or balance concerns reported    No   Tobacco Cessation No Change   Warm-up and Cool-down Performed as group-led instruction   Resistance Training Performed Yes   VAD Patient? No     Pain Assessment   Currently in Pain? No/denies   Multiple Pain Sites No      Capillary Blood Glucose: No results found for this or any previous visit (from the past 24 hour(s)).      Exercise Prescription Changes - 07/31/17 1600      Response to Exercise   Blood Pressure (Admit) 110/62   Blood Pressure (Exercise) 114/60   Blood Pressure (Exit) 98/0   Heart Rate (Admit) 80 bpm   Heart Rate (Exercise) 88 bpm   Heart Rate (Exit) 83 bpm   Oxygen Saturation (Admit) 94 %   Oxygen Saturation (Exercise) 95 %   Oxygen Saturation (Exit) 93 %   Rating of Perceived Exertion (Exercise) 13   Perceived Dyspnea (Exercise) 1   Duration Progress to 45 minutes of aerobic exercise without signs/symptoms of physical distress   Intensity THRR unchanged     Progression   Progression Continue to progress workloads to maintain intensity without signs/symptoms of physical distress.     Resistance Training   Training Prescription Yes   Weight orange bands   Reps 10-15   Time 10 Minutes     NuStep   Level 5   Minutes 17   METs 1.7     Arm Ergometer   Level 6   Minutes  17      History  Smoking Status  . Former Smoker  . Packs/day: 2.00  . Years: 18.00  . Types: Cigarettes  Smokeless Tobacco  . Never Used    Comment: "quit smoking in 1971"    Goals Met:  Exercise tolerated well No report of cardiac concerns or symptoms Strength training completed today  Goals Unmet:  Not Applicable  Comments: Service time is from 1330 to 1520    Dr. Wesam G. Yacoub is Medical Director for Pulmonary Rehab at  Hospital. 

## 2017-08-05 ENCOUNTER — Telehealth: Payer: Self-pay

## 2017-08-05 ENCOUNTER — Encounter (HOSPITAL_COMMUNITY)
Admission: RE | Admit: 2017-08-05 | Discharge: 2017-08-05 | Disposition: A | Discharge status: Home / Self Care | Payer: Medicare Other | Source: Ambulatory Visit | Attending: Internal Medicine | Admitting: Internal Medicine

## 2017-08-05 DIAGNOSIS — Z87891 Personal history of nicotine dependence: Secondary | ICD-10-CM | POA: Diagnosis not present

## 2017-08-05 DIAGNOSIS — Z7951 Long term (current) use of inhaled steroids: Secondary | ICD-10-CM | POA: Diagnosis not present

## 2017-08-05 DIAGNOSIS — I4891 Unspecified atrial fibrillation: Secondary | ICD-10-CM | POA: Diagnosis not present

## 2017-08-05 DIAGNOSIS — M1990 Unspecified osteoarthritis, unspecified site: Secondary | ICD-10-CM | POA: Diagnosis not present

## 2017-08-05 DIAGNOSIS — Z8673 Personal history of transient ischemic attack (TIA), and cerebral infarction without residual deficits: Secondary | ICD-10-CM | POA: Insufficient documentation

## 2017-08-05 DIAGNOSIS — I10 Essential (primary) hypertension: Secondary | ICD-10-CM | POA: Insufficient documentation

## 2017-08-05 DIAGNOSIS — I495 Sick sinus syndrome: Secondary | ICD-10-CM | POA: Diagnosis not present

## 2017-08-05 DIAGNOSIS — I341 Nonrheumatic mitral (valve) prolapse: Secondary | ICD-10-CM | POA: Insufficient documentation

## 2017-08-05 DIAGNOSIS — Z7901 Long term (current) use of anticoagulants: Secondary | ICD-10-CM | POA: Diagnosis not present

## 2017-08-05 DIAGNOSIS — R0609 Other forms of dyspnea: Secondary | ICD-10-CM | POA: Insufficient documentation

## 2017-08-05 DIAGNOSIS — E669 Obesity, unspecified: Secondary | ICD-10-CM | POA: Insufficient documentation

## 2017-08-05 DIAGNOSIS — Z79899 Other long term (current) drug therapy: Secondary | ICD-10-CM | POA: Diagnosis not present

## 2017-08-05 DIAGNOSIS — Z950 Presence of cardiac pacemaker: Secondary | ICD-10-CM | POA: Insufficient documentation

## 2017-08-05 DIAGNOSIS — Z6841 Body Mass Index (BMI) 40.0 and over, adult: Secondary | ICD-10-CM | POA: Insufficient documentation

## 2017-08-05 DIAGNOSIS — K219 Gastro-esophageal reflux disease without esophagitis: Secondary | ICD-10-CM | POA: Diagnosis not present

## 2017-08-05 NOTE — Progress Notes (Signed)
Daily Session Note  Patient Details  Name: [name] [name] MRN: [id number] Date of Birth: 1941/04/21 Referring Provider:     Pulmonary Rehab Walk Test from 06/17/2017 in [place]  Referring Provider  Dr. Annamaria Boots      Encounter Date: 08/05/2017  Check In:     Session Check In - 08/05/17 1330      Check-In   [facility]   Staff Present Kathline Magic, MS, ACSM RCEP, Exercise Physiologist;Iyad Deroo Ysidro Evert, RN;Portia Rollene Rotunda, RN, BSN   Supervising physician immediately available to respond to emergencies [facility] immediately available   Physician(s) Dr. Bonner Puna   Medication changes reported     No   Fall or balance concerns reported    No   Tobacco Cessation No Change   Warm-up and Cool-down Performed as group-led instruction   Resistance Training Performed Yes   VAD Patient? No     Pain Assessment   Currently in Pain? No/denies   Multiple Pain Sites No      Capillary Blood Glucose: No results found for this or any previous visit (from the past 24 hour(s)).      Exercise Prescription Changes - 08/05/17 1500      Response to Exercise   Blood Pressure (Admit) 100/60   Blood Pressure (Exercise) 122/66   Blood Pressure (Exit) 110/60   Heart Rate (Admit) 76 bpm   Heart Rate (Exercise) 94 bpm   Heart Rate (Exit) 65 bpm   Oxygen Saturation (Admit) 96 %   Oxygen Saturation (Exercise) 94 %   Oxygen Saturation (Exit) 91 %   Rating of Perceived Exertion (Exercise) 14   Perceived Dyspnea (Exercise) 3   Duration Progress to 45 minutes of aerobic exercise without signs/symptoms of physical distress   Intensity THRR unchanged     Progression   Progression Continue to progress workloads to maintain intensity without signs/symptoms of physical distress.     Resistance Training   Training Prescription Yes   Weight orange bands   Reps 10-15   Time 10 Minutes     NuStep   Level 5   Minutes 17   METs 2.2     Arm Ergometer   Level 6.5   Minutes 17      History  Smoking Status  . Former Smoker  . Packs/day: 2.00  . Years: 18.00  . Types: Cigarettes  Smokeless Tobacco  . Never Used    Comment: "quit smoking in 1971"    Goals Met:  Exercise tolerated well No report of cardiac concerns or symptoms Strength training completed today  Goals Unmet:  Not Applicable  Comments: Service time is from 1330 to 1500    Dr. Rush Farmer is Medical Director for Pulmonary Rehab at [facility].

## 2017-08-05 NOTE — Telephone Encounter (Signed)
Left message on personal voice mail Eliquis 5 mg samples left at [facility] office front desk.

## 2017-08-07 ENCOUNTER — Encounter (HOSPITAL_COMMUNITY): Payer: Medicare Other

## 2017-08-12 ENCOUNTER — Encounter (HOSPITAL_COMMUNITY)
Admission: RE | Admit: 2017-08-12 | Discharge: 2017-08-12 | Disposition: A | Discharge status: Home / Self Care | Payer: Medicare Other | Source: Ambulatory Visit | Attending: Internal Medicine | Admitting: Internal Medicine

## 2017-08-12 VITALS — Wt 272.7 lb

## 2017-08-12 DIAGNOSIS — R0609 Other forms of dyspnea: Principal | ICD-10-CM

## 2017-08-12 DIAGNOSIS — Z7951 Long term (current) use of inhaled steroids: Secondary | ICD-10-CM | POA: Diagnosis not present

## 2017-08-12 DIAGNOSIS — Z950 Presence of cardiac pacemaker: Secondary | ICD-10-CM | POA: Diagnosis not present

## 2017-08-12 DIAGNOSIS — M1990 Unspecified osteoarthritis, unspecified site: Secondary | ICD-10-CM | POA: Diagnosis not present

## 2017-08-12 DIAGNOSIS — Z7901 Long term (current) use of anticoagulants: Secondary | ICD-10-CM | POA: Diagnosis not present

## 2017-08-12 DIAGNOSIS — Z79899 Other long term (current) drug therapy: Secondary | ICD-10-CM | POA: Diagnosis not present

## 2017-08-12 NOTE — Progress Notes (Signed)
Daily Session Note  Patient Details  Name: [name] [name] MRN: [id number] Date of Birth: Oct 24, 1941 Referring Provider:     Pulmonary Rehab Walk Test from 06/17/2017 in [place]  Referring Provider  Dr. Annamaria Boots      Encounter Date: 08/12/2017  Check In:     Session Check In - 08/12/17 1330      Check-In   Location [facility]   Staff Present Ramon Dredge, RN, MHA;Endre Coutts Rollene Rotunda, RN, BSN;Molly diVincenzo, MS, ACSM RCEP, Exercise Physiologist   Supervising physician immediately available to respond to emergencies [facility] immediately available   Physician(s) Dr. Bonner Puna   Medication changes reported     No   Fall or balance concerns reported    No   Tobacco Cessation No Change   Warm-up and Cool-down Performed as group-led instruction   Resistance Training Performed Yes   VAD Patient? No     Pain Assessment   Currently in Pain? No/denies   Multiple Pain Sites No      Capillary Blood Glucose: No results found for this or any previous visit (from the past 24 hour(s)).      Exercise Prescription Changes - 08/12/17 1559      Response to Exercise   Blood Pressure (Admit) 100/62   Blood Pressure (Exercise) 104/62   Blood Pressure (Exit) 104/64   Heart Rate (Admit) 70 bpm   Heart Rate (Exercise) 99 bpm   Heart Rate (Exit) 70 bpm   Oxygen Saturation (Admit) 95 %   Oxygen Saturation (Exercise) 96 %   Oxygen Saturation (Exit) 95 %   Rating of Perceived Exertion (Exercise) 13   Perceived Dyspnea (Exercise) 3   Duration Progress to 45 minutes of aerobic exercise without signs/symptoms of physical distress   Intensity THRR unchanged     Progression   Progression Continue to progress workloads to maintain intensity without signs/symptoms of physical distress.     Resistance Training   Training Prescription Yes   Weight orange bands   Reps 10-15   Time 10 Minutes     NuStep   Level 5   Minutes 17   METs 1.9      Arm Ergometer   Level 6.5   Minutes 17      History  Smoking Status  . Former Smoker  . Packs/day: 2.00  . Years: 18.00  . Types: Cigarettes  Smokeless Tobacco  . Never Used    Comment: "quit smoking in 1971"    Goals Met:  Independence with exercise equipment Improved SOB with ADL's Using PLB without cueing & demonstrates good technique Exercise tolerated well Personal goals reviewed No report of cardiac concerns or symptoms Strength training completed today  Goals Unmet:  Not Applicable  Comments: Service time is from 1330 to 1530   Dr. Rush Farmer is Medical Director for Pulmonary Rehab at [facility].

## 2017-08-14 ENCOUNTER — Encounter (HOSPITAL_COMMUNITY)
Admission: RE | Admit: 2017-08-14 | Discharge: 2017-08-14 | Disposition: A | Discharge status: Home / Self Care | Payer: Medicare Other | Source: Ambulatory Visit | Attending: Internal Medicine | Admitting: Internal Medicine

## 2017-08-14 VITALS — Wt 273.1 lb

## 2017-08-14 DIAGNOSIS — Z7901 Long term (current) use of anticoagulants: Secondary | ICD-10-CM | POA: Diagnosis not present

## 2017-08-14 DIAGNOSIS — R0609 Other forms of dyspnea: Principal | ICD-10-CM

## 2017-08-14 DIAGNOSIS — Z7951 Long term (current) use of inhaled steroids: Secondary | ICD-10-CM | POA: Diagnosis not present

## 2017-08-14 DIAGNOSIS — Z950 Presence of cardiac pacemaker: Secondary | ICD-10-CM | POA: Diagnosis not present

## 2017-08-14 DIAGNOSIS — M1990 Unspecified osteoarthritis, unspecified site: Secondary | ICD-10-CM | POA: Diagnosis not present

## 2017-08-14 DIAGNOSIS — Z79899 Other long term (current) drug therapy: Secondary | ICD-10-CM | POA: Diagnosis not present

## 2017-08-14 NOTE — Progress Notes (Signed)
Daily Session Note  Patient Details  Name: [name] [name] MRN: [id number] Date of Birth: 12/14/1940 Referring Provider:     Pulmonary Rehab Walk Test from 06/17/2017 in [place]  Referring Provider  Dr. Annamaria Boots      Encounter Date: 08/14/2017  Check In:     Session Check In - 08/14/17 1528      Check-In   [facility]   Staff Present Su Hilt, MS, ACSM RCEP, Exercise Physiologist;Lilias Lorensen Rollene Rotunda, RN, BSN   Supervising physician immediately available to respond to emergencies [facility] immediately available   Physician(s) Dr. Carolin Sicks   Medication changes reported     No   Fall or balance concerns reported    No   Tobacco Cessation No Change   Warm-up and Cool-down Performed as group-led instruction   Resistance Training Performed Yes   VAD Patient? No     Pain Assessment   Currently in Pain? No/denies   Multiple Pain Sites No      Capillary Blood Glucose: No results found for this or any previous visit (from the past 24 hour(s)).      Exercise Prescription Changes - 08/14/17 1546      Response to Exercise   Blood Pressure (Admit) 98/62   Blood Pressure (Exercise) 98/56   Blood Pressure (Exit) 100/54   Heart Rate (Admit) 77 bpm   Heart Rate (Exercise) 86 bpm   Heart Rate (Exit) 82 bpm   Oxygen Saturation (Admit) 95 %   Oxygen Saturation (Exercise) 95 %   Oxygen Saturation (Exit) 96 %   Rating of Perceived Exertion (Exercise) 12   Perceived Dyspnea (Exercise) 2   Duration Progress to 45 minutes of aerobic exercise without signs/symptoms of physical distress   Intensity THRR unchanged     Progression   Progression Continue to progress workloads to maintain intensity without signs/symptoms of physical distress.     Resistance Training   Training Prescription Yes   Weight orange bands   Reps 10-15   Time 10 Minutes     NuStep   Level 5   Minutes 51   METs 2      History  Smoking  Status  . Former Smoker  . Packs/day: 2.00  . Years: 18.00  . Types: Cigarettes  Smokeless Tobacco  . Never Used    Comment: "quit smoking in 1971"    Goals Met:  Independence with exercise equipment Improved SOB with ADL's Using PLB without cueing & demonstrates good technique Exercise tolerated well No report of cardiac concerns or symptoms Strength training completed today  Goals Unmet:  Not Applicable  Comments: Service time is from 1330 to 1450   Dr. Rush Farmer is Medical Director for Pulmonary Rehab at [facility].

## 2017-08-14 NOTE — Progress Notes (Signed)
I have reviewed a Home Exercise Prescription with [name] [name] . [name] [name] is not currently exercising at home.  The patient was advised to walk 2-3 days a week for 30 minutes.  [name] [name] and I discussed how to progress their exercise prescription.  The patient stated that their goals were to increase walking distances and gain strength.  The patient stated that they understand the exercise prescription.  We reviewed exercise guidelines, target heart rate during exercise, oxygen use, weather, home pulse oximeter, endpoints for exercise, and goals.  Patient is encouraged to come to me with any questions. I will continue to follow up with the patient to assist them with progression and safety.

## 2017-08-19 ENCOUNTER — Encounter (HOSPITAL_COMMUNITY)
Admission: RE | Admit: 2017-08-19 | Discharge: 2017-08-19 | Disposition: A | Discharge status: Home / Self Care | Payer: Medicare Other | Source: Ambulatory Visit | Attending: Internal Medicine | Admitting: Internal Medicine

## 2017-08-19 VITALS — Wt 273.4 lb

## 2017-08-19 DIAGNOSIS — Z79899 Other long term (current) drug therapy: Secondary | ICD-10-CM | POA: Diagnosis not present

## 2017-08-19 DIAGNOSIS — Z7901 Long term (current) use of anticoagulants: Secondary | ICD-10-CM | POA: Diagnosis not present

## 2017-08-19 DIAGNOSIS — Z950 Presence of cardiac pacemaker: Secondary | ICD-10-CM | POA: Diagnosis not present

## 2017-08-19 DIAGNOSIS — R0609 Other forms of dyspnea: Secondary | ICD-10-CM | POA: Diagnosis not present

## 2017-08-19 DIAGNOSIS — M1990 Unspecified osteoarthritis, unspecified site: Secondary | ICD-10-CM | POA: Diagnosis not present

## 2017-08-19 DIAGNOSIS — Z7951 Long term (current) use of inhaled steroids: Secondary | ICD-10-CM | POA: Diagnosis not present

## 2017-08-19 NOTE — Progress Notes (Signed)
Daily Session Note  Patient Details  Name: [name] [name] MRN: [id number] Date of Birth: 11-23-1941 Referring Provider:     Pulmonary Rehab Walk Test from 06/17/2017 in [place]  Referring Provider  Dr. Annamaria Boots      Encounter Date: 08/19/2017  Check In:     Session Check In - 08/19/17 1330      Check-In   Location [facility]   Staff Present Rodney Langton, RN;Molly diVincenzo, MS, ACSM RCEP, Exercise Physiologist;Katty Fretwell Rollene Rotunda, RN, BSN   Supervising physician immediately available to respond to emergencies [facility] immediately available   Physician(s) Dr. Carolin Sicks   Medication changes reported     No   Fall or balance concerns reported    No   Tobacco Cessation No Change   Warm-up and Cool-down Performed as group-led instruction   Resistance Training Performed Yes   VAD Patient? No     Pain Assessment   Currently in Pain? No/denies   Multiple Pain Sites No      Capillary Blood Glucose: No results found for this or any previous visit (from the past 24 hour(s)).      Exercise Prescription Changes - 08/19/17 1555      Response to Exercise   Blood Pressure (Admit) 98/62   Blood Pressure (Exercise) 97/67   Blood Pressure (Exit) 114/74   Heart Rate (Admit) 68 bpm   Heart Rate (Exercise) 100 bpm   Heart Rate (Exit) 90 bpm   Oxygen Saturation (Admit) 94 %   Oxygen Saturation (Exercise) 94 %   Oxygen Saturation (Exit) 94 %   Rating of Perceived Exertion (Exercise) 14   Perceived Dyspnea (Exercise) 3   Duration Progress to 45 minutes of aerobic exercise without signs/symptoms of physical distress   Intensity THRR unchanged     Progression   Progression Continue to progress workloads to maintain intensity without signs/symptoms of physical distress.     Resistance Training   Training Prescription Yes   Weight orange bands   Reps 10-15   Time 10 Minutes     NuStep   Level 5   Minutes 17   METs 2.1     Arm  Ergometer   Level 6   Minutes 17     Track   Laps 8   Minutes 17      History  Smoking Status  . Former Smoker  . Packs/day: 2.00  . Years: 18.00  . Types: Cigarettes  Smokeless Tobacco  . Never Used    Comment: "quit smoking in 1971"    Goals Met:  Improved SOB with ADL's Using PLB without cueing & demonstrates good technique Exercise tolerated well Strength training completed today  Goals Unmet:  Not Applicable  Comments: Service time is from 1330 to 1505   Dr. Rush Farmer is Medical Director for Pulmonary Rehab at [facility].

## 2017-08-19 NOTE — Progress Notes (Deleted)
Daily Session Note  Patient Details  Name: [name] [name] MRN: [id number] Date of Birth: 09/22/1941 Referring Provider:     Pulmonary Rehab Walk Test from 06/17/2017 in [place]  Referring Provider  Dr. Annamaria Boots      Encounter Date: 08/19/2017  Check In:     Session Check In - 08/19/17 1330      Check-In   Location [facility]   Staff Present Rodney Langton, RN;Molly diVincenzo, MS, ACSM RCEP, Exercise Physiologist;Katelee Schupp Rollene Rotunda, RN, BSN   Supervising physician immediately available to respond to emergencies [facility] immediately available   Physician(s) Dr. Carolin Sicks   Medication changes reported     No   Fall or balance concerns reported    No   Tobacco Cessation No Change   Warm-up and Cool-down Performed as group-led instruction   Resistance Training Performed Yes   VAD Patient? No     Pain Assessment   Currently in Pain? No/denies   Multiple Pain Sites No      Capillary Blood Glucose: No results found for this or any previous visit (from the past 24 hour(s)).      Exercise Prescription Changes - 08/19/17 1555      Response to Exercise   Blood Pressure (Admit) 100/60   Blood Pressure (Exercise) 122/66   Blood Pressure (Exit) 110/60   Heart Rate (Admit) 76 bpm   Heart Rate (Exercise) 94 bpm   Heart Rate (Exit) 65 bpm   Oxygen Saturation (Admit) 96 %   Oxygen Saturation (Exercise) 94 %   Oxygen Saturation (Exit) 91 %   Rating of Perceived Exertion (Exercise) 14   Perceived Dyspnea (Exercise) 3   Duration Progress to 45 minutes of aerobic exercise without signs/symptoms of physical distress   Intensity THRR unchanged     Progression   Progression Continue to progress workloads to maintain intensity without signs/symptoms of physical distress.     Resistance Training   Training Prescription Yes   Weight orange bands   Reps 10-15   Time 10 Minutes     NuStep   Level 5   Minutes 17   METs 2.2     Arm  Ergometer   Level 6.5   Minutes 17      History  Smoking Status  . Former Smoker  . Packs/day: 2.00  . Years: 18.00  . Types: Cigarettes  Smokeless Tobacco  . Never Used    Comment: "quit smoking in 1971"    Goals Met:  Using PLB without cueing & demonstrates good technique Exercise tolerated well No report of cardiac concerns or symptoms Strength training completed today  Goals Unmet:  Not Applicable  Comments: Service time is from 1330 to 1505   Dr. Rush Farmer is Medical Director for Pulmonary Rehab at [facility].

## 2017-08-20 ENCOUNTER — Ambulatory Visit (INDEPENDENT_AMBULATORY_CARE_PROVIDER_SITE_OTHER): Payer: Medicare Other | Admitting: Cardiovascular Disease

## 2017-08-20 ENCOUNTER — Encounter: Payer: Self-pay | Admitting: Cardiovascular Disease

## 2017-08-20 VITALS — BP 102/70 | HR 77 | Ht 66.0 in | Wt 273.4 lb

## 2017-08-20 DIAGNOSIS — Z950 Presence of cardiac pacemaker: Secondary | ICD-10-CM

## 2017-08-20 DIAGNOSIS — Z7901 Long term (current) use of anticoagulants: Secondary | ICD-10-CM | POA: Diagnosis not present

## 2017-08-20 DIAGNOSIS — I495 Sick sinus syndrome: Secondary | ICD-10-CM

## 2017-08-20 DIAGNOSIS — I480 Paroxysmal atrial fibrillation: Secondary | ICD-10-CM | POA: Diagnosis not present

## 2017-08-20 DIAGNOSIS — R0609 Other forms of dyspnea: Secondary | ICD-10-CM | POA: Diagnosis not present

## 2017-08-20 DIAGNOSIS — Z9989 Dependence on other enabling machines and devices: Secondary | ICD-10-CM

## 2017-08-20 DIAGNOSIS — G4733 Obstructive sleep apnea (adult) (pediatric): Secondary | ICD-10-CM | POA: Diagnosis not present

## 2017-08-20 MED ORDER — METOPROLOL TARTRATE 25 MG PO TABS: 12.5000 mg | ORAL_TABLET | Freq: Two times a day (BID) | ORAL | 3 refills | Status: DC

## 2017-08-20 NOTE — Patient Instructions (Signed)
Dr Sallyanne Kuster has recommended making the following medication changes: 1. DECREASE Metoprolol to 12.5 mg (0.5 tablet) twice daily  Your physician has recommended that you have a Cardioversion (DCCV). This has been scheduled for Tuesday, October 2nd, 2018 with Dr Sallyanne Kuster. Please arrive to the hospital no later than 12:30p. Electrical Cardioversion uses a jolt of electricity to your heart either through paddles or wired patches attached to your chest. This is a controlled, usually prescheduled, procedure. Defibrillation is done under light anesthesia in the hospital, and you usually go home the day of the procedure. This is done to get your heart back into a normal rhythm. You are not awake for the procedure. Please see the instruction sheet given to you today.

## 2017-08-20 NOTE — Progress Notes (Signed)
 Cardiology Office Note    Date:  08/20/2017   ID:  [name] [name], DOB 11/18/1941, MRN [id number]  PCP:  Tabori, Katherine E, MD  Cardiologist:   Merinda Victorino, MD   Chief Complaint  Patient presents with  . Follow-up    pt c/o SOB and fatigue, pt denied any chest pain    History of Present Illness:  [name] [name] is a 75 y.o. female with complaints of worsening dyspnea and cough.   These problems have been going on for about a year. She has been seeing Dr. Young for lung nodules and fibrosis. These findings seem to be relatively stable on 3 month CT. 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 [name] [name] has a mildly positive ANA at 1:160, but felt not to have lupus after rheumatology consultation. She has a long-standing history of obstructive sleep apnea but uses CPAP. 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 She does not have angina pectoris. Nuclear stress test in January 2018 was normal/low risk.  She has had episodes of brief paroxysmal atrial fibrillation the past, usually lasting a few minutes to a maximum of 6 hours. Interrogation of her pacemaker today shows that she has been an uninterrupted persistent atrial fibrillation for about 3 months. Detailed interrogation about her symptoms and functional ability does not appear to suggest that there has been any additional deterioration in her symptoms after the onset of persistent atrial fibrillation. She relates the onset of her fatigue and dyspnea to a year ago.  By pacemaker monitoring ventricular rate control is very good. This is also confirmed with the frequent vital signs submitted from pulmonary rehabilitation. At rest her heart rate is typically  in the 70s. With exercise it peaks at about 105-110  Otherwise pacemaker function is normal. Before she had virtually no ventricular pacing, after atrial fibrillation onset she has roughly 16% ventricular pacing due to the erratic R-R interval. Heart rate histogram distribution is  appropriate. Activity is relatively constant at 2.6 hours a day, this is substantially worse than the average 4 hours per day at her last office pacemaker check.. Estimated battery longevity is about 6.5 years.  Her echocardiogram in April 2015 showed evidence of diastolic dysfunction without elevated filling pressures. Similar findings on echo in September 2017 with EF 50-55% and grade 1 diastolic dysfunction without elevated filling pressures. She has not had clinically evident congestive heart failure in the past. She does not have systemic hypertension.in fact, her blood pressure has been running relatively low (she does take metoprolol twice daily for ventricular rate control).   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  She is compliant with anticoagulation and has not had any bleeding problems or new neurological complaints. She denies angina pectoris at rest or with exertion, syncope, palpitations, leg edema, orthopnea or PND.     Past Medical History:  Diagnosis Date  . Alpha-1-antitrypsin deficiency carrier (HCC)   . Arthritis    "all over"  . Atrial fibrillation (HCC)   . Complication of anesthesia    "I woke up during 2 different procedures" (10/04/2014)  . Frequent UTI   . GERD (gastroesophageal reflux disease)   .   Hypertension   . MVP (mitral valve prolapse)   . Obesity   . Presence of permanent cardiac pacemaker   . PVC's (premature ventricular contractions)   . 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 Stroke [facility])  01/2014   denies deficits on 10/04/2014  . Tachy-brady syndrome ([place]) 10/2014    Past Surgical History:  Procedure Laterality Date  . EXCISION VAGINAL CYST     benign nodules  . INSERT / REPLACE / REMOVE PACEMAKER  10/04/2014   Medtronic Advisa model [id number] serial number [id number]  . LOOP RECORDER EXPLANT N/A 10/04/2014   Procedure: LOOP RECORDER EXPLANT;  Surgeon: Sanda Klein, MD;  Location: [facility];  Service: Cardiovascular;  Laterality: N/A;  . LOOP RECORDER IMPLANT N/A 04/19/2014   Procedure: LOOP RECORDER IMPLANT;  Surgeon: Sanda Klein, MD;  Location: [facility];  Service: Cardiovascular;  Laterality: N/A;  . NM MYOCAR PERF WALL MOTION  12/18/2007   normal  . PERMANENT PACEMAKER INSERTION N/A 10/04/2014   Procedure: PERMANENT PACEMAKER INSERTION;  Surgeon: Sanda Klein, MD;  Location: [facility];  Service: Cardiovascular;  Laterality: N/A;  . TUBAL LIGATION    . US ECHOCARDIOGRAPHY  05/15/2010   LA mildly dilated,mild mitral annular ca+, AOV mildly sclerotic, mild asymmetric LVH    Current Medications: Outpatient Medications Prior to Visit  Medication Sig Dispense Refill  . apixaban (ELIQUIS) 5 MG TABS tablet Take 1 tablet (5 mg total) by mouth 2 (two) times daily. 28 tablet 0  . Biotin 5000 MCG CAPS Take 5,000 mcg by mouth daily.     . Calcium Carbonate-Vit D-Min (CALCIUM 1200 PO) Take 2,400 mg by mouth daily.     . cholecalciferol (VITAMIN D) 1000 UNITS tablet Take 1,000 Units by mouth daily.     Marland Kitchen Co-Enzyme Q10 100 MG CAPS Take 100 mg by mouth daily.     . diazepam (VALIUM) 5 MG tablet Take 1 tablet (5 mg total) by mouth as needed for anxiety. 30 tablet 1  . ELIQUIS 5 MG TABS tablet TAKE ONE TABLET BY MOUTH TWICE DAILY 034 tablet 1  . folic acid (FOLVITE) 742 MCG tablet Take 400 mcg by mouth daily.    Marland Kitchen glucosamine-chondroitin 500-400 MG tablet Take 1 tablet by mouth daily.     Marland Kitchen loratadine (KLS ALLERCLEAR) 10 MG tablet Take 10 mg by mouth daily.    Marland Kitchen MAGNESIUM  CITRATE PO Take 250 mg by mouth daily. 1 tab daily    . pantoprazole (PROTONIX) 40 MG tablet TAKE 1 TABLET BY MOUTH DAILY 60 tablet 1  . Resveratrol 250 MG CAPS Take 250 mg by mouth daily.    . sertraline (ZOLOFT) 100 MG tablet Take 1 tablet (100 mg total) by mouth daily. 90 tablet 0  . Simethicone (GAS-X PO) Take 2 tablets by mouth daily as needed (bloating).    . metoprolol tartrate (LOPRESSOR) 25 MG tablet Take 1 tablet (25 mg total) by mouth 2 (two) times daily. 180 tablet 2  . BREO ELLIPTA 100-25 MCG/INH AEPB Take 1 puff by mouth daily.     No facility-administered medications prior to visit.      Allergies:   Ancef [cefazolin]; Pseudoephedrine hcl; and Caffeine   Social History   Social History  . Marital status: Married    Spouse name: N/A  . Number of children: 3  . Years of education: college   Social History Main Topics  . Smoking status: Former Smoker  Packs/day: 2.00    Years: 18.00    Types: Cigarettes  . Smokeless tobacco: Never Used     Comment: "quit smoking in 1971"  . Alcohol use 0.0 oz/week     Comment: 10/04/2014 "might have a drink a couple times/yr"  . Drug use: No  . Sexual activity: No   Other Topics Concern  . Not on file   Social History Narrative   Patient lives alone in one story house.   Patient is right handed.   Patient has a college degree.   Patient is retired.     Family History:  The patient's family history includes Heart attack in her mother; Heart failure in her brother and father; Stroke in her brother and father.   ROS:   Please see the history of present illness.    ROS All other systems reviewed and are negative.   PHYSICAL EXAM:   VS:  BP 102/70   Pulse 77   Ht 5' 6" (1.676 m)   Wt 273 lb 6.4 oz (124 kg)   BMI 44.13 kg/m    GEN: Well nourished, well developed, in no acute distress  HEENT: normal  Neck: no JVD, carotid bruits, or masses Cardiac: RRR; no murmurs, rubs, or gallops,no edema ,  LC left subclavian  pacemaker site Respiratory:  clear to auscultation bilaterally, normal work of breathing GI: soft, nontender, nondistended, + BS MS: no deformity or atrophy  Skin: warm and dry, no rash Neuro:  Alert and Oriented x 3, Strength and sensation are intact Psych: euthymic mood, full affect  Wt Readings from Last 3 Encounters:  08/20/17 273 lb 6.4 oz (124 kg)  08/19/17 273 lb 5.9 oz (124 kg)  08/14/17 273 lb 2.4 oz (123.9 kg)      Studies/Labs Reviewed:   EKG:  EKG is ordered today.  The ekg ordered today demonstrates atrial fibrillation, ventricular pacing occurs roughly 50% of the time on 12-lead tracing, QTC 402 ms  Recent Labs: 12/10/2016: Pro B Natriuretic peptide (BNP) 99.0 01/15/2017: ALT 15; BUN 14; Creatinine, Ser 0.97; Potassium 4.5; Sodium 139 02/24/2017: Hemoglobin 14.6; Platelets 251.0   Lipid Panel    Component Value Date/Time   CHOL 182 01/15/2017 1417   TRIG 168.0 (H) 01/15/2017 1417   HDL 54.60 01/15/2017 1417   CHOLHDL 3 01/15/2017 1417   VLDL 33.6 01/15/2017 1417   LDLCALC 94 01/15/2017 1417   LDLDIRECT 81.0 07/15/2016 1419    ASSESSMENT:    1. PAF (paroxysmal atrial fibrillation) (HCC)   2. Tachy-brady syndrome (HCC)   3. Pacemaker   4. Dyspnea on exertion   5. Long term current use of anticoagulant   6. Morbid obesity (HCC)   7. OSA on CPAP      PLAN:  In order of problems listed above:  1. AFib: this is her first episode of persistent atrial fibrillation and it has been going on for about 3 months. As far as I can tell it's really not responsible for any of her symptoms. It seems to be the consequence of respiratory problems, rather than its cause. Rate control is adequate and there is probably room to reduce the dose of beta blocker. We'll cut back metoprolol to 12.5 mg twice daily. CHADSVasc 5 (Age 1, gender, TIA). Rate control during the longer episodes of atrial fibrillation is excellent.I would recommend a 1 time shot at cardioversion. If there is  substantial improvement in symptoms when she is in normal rhythm, that would justify further aggressive   antiarrhythmic pursuits (whether with medications or ablation). However, I anticipate there'll be little symptom improvement when we return to normal rhythm. This procedure has been fully reviewed with the patient and written informed consent has been obtained. 2. SSS: prior to onset of atrial fibrillation she had a very high prevalence of atrial pacing with satisfactory heart rate histogram distribution with the current pacemaker settings. 3. PPM: Normal device function, remote downloads every 3 months and yearly office visits 4. Dyspnea: unclear to what degree the abnormal radiological findings are responsible for her symptoms. The PFTs were really not that bad. I also don't think that her dyspnea is related to heart failure. Ultimately could consider right and left heart catheterization to clarify this. Will discuss with Dr. Young. I had always assumed that a lot of her complaints were related to obesity and deconditioning, but there does seem to be a significant downturn over the last 1 year. 5. Anticoagulation: no bleeding complications on Eliquis 6. Obesity: Strongly recommend weight loss which will help with her dyspnea and reduce the burden of arrhythmia. 7. OSA reports compliance with CPAP    Medication Adjustments/Labs and Tests Ordered: Current medicines are reviewed at length with the patient today.  Concerns regarding medicines are outlined above.  Medication changes, Labs and Tests ordered today are listed in the Patient Instructions below. Patient Instructions  Dr Coran Dipaola has recommended making the following medication changes: 1. DECREASE Metoprolol to 12.5 mg (0.5 tablet) twice daily  Your physician has recommended that you have a Cardioversion (DCCV). This has been scheduled for Tuesday, October 2nd, 2018 with Dr Shenea Giacobbe. Please arrive to the hospital no later than 12:30p.  Electrical Cardioversion uses a jolt of electricity to your heart either through paddles or wired patches attached to your chest. This is a controlled, usually prescheduled, procedure. Defibrillation is done under light anesthesia in the hospital, and you usually go home the day of the procedure. This is done to get your heart back into a normal rhythm. You are not awake for the procedure. Please see the instruction sheet given to you today.    Signed, Jajaira Ruis, MD  08/20/2017 4:33 PM    [facility] [address], [place], [place]  [address] Phone: [phone]; Fax: [phone]   

## 2017-08-21 ENCOUNTER — Encounter (HOSPITAL_COMMUNITY)
Admission: RE | Admit: 2017-08-21 | Discharge: 2017-08-21 | Disposition: A | Discharge status: Home / Self Care | Payer: Medicare Other | Source: Ambulatory Visit | Attending: Internal Medicine | Admitting: Internal Medicine

## 2017-08-21 VITALS — Wt 276.2 lb

## 2017-08-21 DIAGNOSIS — Z950 Presence of cardiac pacemaker: Secondary | ICD-10-CM | POA: Diagnosis not present

## 2017-08-21 DIAGNOSIS — Z7901 Long term (current) use of anticoagulants: Secondary | ICD-10-CM | POA: Diagnosis not present

## 2017-08-21 DIAGNOSIS — R0609 Other forms of dyspnea: Secondary | ICD-10-CM | POA: Diagnosis not present

## 2017-08-21 DIAGNOSIS — M1990 Unspecified osteoarthritis, unspecified site: Secondary | ICD-10-CM | POA: Diagnosis not present

## 2017-08-21 DIAGNOSIS — Z7951 Long term (current) use of inhaled steroids: Secondary | ICD-10-CM | POA: Diagnosis not present

## 2017-08-21 DIAGNOSIS — Z79899 Other long term (current) drug therapy: Secondary | ICD-10-CM | POA: Diagnosis not present

## 2017-08-21 LAB — CUP PACEART INCLINIC DEVICE CHECK
Battery Remaining Longevity: 78 mo
Brady Statistic AP VS Percent: 40.23 %
Brady Statistic AS VP Percent: 16.25 %
Brady Statistic AS VS Percent: 43.5 %
Brady Statistic RV Percent Paced: 16.89 %
Date Time Interrogation Session: 20180919160626
Implantable Lead Implant Date: 20151103
Implantable Lead Location: 753860
Implantable Lead Model: 5076
Lead Channel Impedance Value: 475 Ohm
Lead Channel Impedance Value: 532 Ohm
Lead Channel Pacing Threshold Amplitude: 0.5 V
Lead Channel Sensing Intrinsic Amplitude: 1.875 mV
Lead Channel Sensing Intrinsic Amplitude: 2.25 mV
Lead Channel Sensing Intrinsic Amplitude: 7.375 mV
Lead Channel Setting Pacing Amplitude: 2 V
MDC IDC LEAD IMPLANT DT: 20151103
MDC IDC LEAD LOCATION: 753859
MDC IDC MSMT BATTERY VOLTAGE: 3.01 V
MDC IDC MSMT LEADCHNL RA IMPEDANCE VALUE: 380 Ohm
MDC IDC MSMT LEADCHNL RA PACING THRESHOLD AMPLITUDE: 0.5 V
MDC IDC MSMT LEADCHNL RA PACING THRESHOLD PULSEWIDTH: 0.4 ms
MDC IDC MSMT LEADCHNL RV IMPEDANCE VALUE: 494 Ohm
MDC IDC MSMT LEADCHNL RV PACING THRESHOLD PULSEWIDTH: 0.4 ms
MDC IDC MSMT LEADCHNL RV SENSING INTR AMPL: 9.625 mV
MDC IDC PG IMPLANT DT: 20151103
MDC IDC SET LEADCHNL RA PACING AMPLITUDE: 1.5 V
MDC IDC SET LEADCHNL RV PACING PULSEWIDTH: 0.4 ms
MDC IDC SET LEADCHNL RV SENSING SENSITIVITY: 2.8 mV
MDC IDC STAT BRADY AP VP PERCENT: 0.02 %
MDC IDC STAT BRADY RA PERCENT PACED: 29.04 %

## 2017-08-21 NOTE — Progress Notes (Signed)
Daily Session Note  Patient Details  Name: [name] [name] MRN: [id number] Date of Birth: [birth year] Referring Provider:     Pulmonary Rehab Walk Test from 06/17/2017 in [facility]  Referring Provider  Dr. Annamaria Boots      Encounter Date: 08/21/2017  Check In:     Session Check In - 08/21/17 1352      Check-In   Location [facility]   Staff Present Su Hilt, MS, ACSM RCEP, Exercise Physiologist;Portia Rollene Rotunda, RN, BSN   Supervising physician immediately available to respond to emergencies [facility] immediately available   Physician(s) Dr. Wynelle Cleveland   Medication changes reported     No   Fall or balance concerns reported    No   Tobacco Cessation No Change   Warm-up and Cool-down Performed as group-led instruction   Resistance Training Performed Yes   VAD Patient? No     Pain Assessment   Currently in Pain? No/denies   Multiple Pain Sites No      Capillary Blood Glucose: No results found for this or any previous visit (from the past 24 hour(s)).      Exercise Prescription Changes - 08/21/17 1600      Response to Exercise   Blood Pressure (Admit) 92/68   Blood Pressure (Exercise) 112/62   Blood Pressure (Exit) 100/80   Heart Rate (Admit) 80 bpm   Heart Rate (Exercise) 98 bpm   Heart Rate (Exit) 86 bpm   Oxygen Saturation (Admit) 96 %   Oxygen Saturation (Exercise) 95 %   Oxygen Saturation (Exit) 96 %   Rating of Perceived Exertion (Exercise) 13   Perceived Dyspnea (Exercise) 2   Duration Progress to 45 minutes of aerobic exercise without signs/symptoms of physical distress   Intensity THRR unchanged     Progression   Progression Continue to progress workloads to maintain intensity without signs/symptoms of physical distress.     Resistance Training   Training Prescription Yes   Weight orange bands   Reps 10-15   Time 10 Minutes     NuStep   Level 5   Minutes 17   METs 1.2     Arm Ergometer   Level  6   Minutes 17      History  Smoking Status  . Former Smoker  . Packs/day: 2.00  . Years: 18.00  . Types: Cigarettes  Smokeless Tobacco  . Never Used    Comment: "quit smoking in 1971"    Goals Met:  Exercise tolerated well No report of cardiac concerns or symptoms Strength training completed today  Goals Unmet:  Not Applicable  Comments: Service time is from 1:30p to 3:30p    Dr. Rush Farmer is Medical Director for Pulmonary Rehab at [facility].

## 2017-08-22 NOTE — Progress Notes (Signed)
Pulmonary Individual Treatment Plan  Patient Details  Name: [name] [name] MRN: [id number] Date of Birth: July 09, 1941 Referring Provider:     Pulmonary Rehab Walk Test from 06/17/2017 in [place]  Referring Provider  Dr. Annamaria Boots      Initial Encounter Date:    Pulmonary Rehab Walk Test from 06/17/2017 in [place]  Date  06/19/17  Referring Provider  Dr. Annamaria Boots      Visit Diagnosis: Dyspnea on exertion  Patient's Home Medications on Admission:   Current Outpatient Prescriptions:  .  apixaban (ELIQUIS) 5 MG TABS tablet, Take 1 tablet (5 mg total) by mouth 2 (two) times daily., Disp: 28 tablet, Rfl: 0 .  Biotin 5000 MCG CAPS, Take 5,000 mcg by mouth daily. , Disp: , Rfl:  .  Calcium Carbonate-Vit D-Min (CALCIUM 1200 PO), Take 2,400 mg by mouth daily. , Disp: , Rfl:  .  cholecalciferol (VITAMIN D) 1000 UNITS tablet, Take 1,000 Units by mouth daily. , Disp: , Rfl:  .  Co-Enzyme Q10 100 MG CAPS, Take 100 mg by mouth daily. , Disp: , Rfl:  .  diazepam (VALIUM) 5 MG tablet, Take 1 tablet (5 mg total) by mouth as needed for anxiety., Disp: 30 tablet, Rfl: 1 .  ELIQUIS 5 MG TABS tablet, TAKE ONE TABLET BY MOUTH TWICE DAILY, Disp: 180 tablet, Rfl: 1 .  folic acid (FOLVITE) 240 MCG tablet, Take 400 mcg by mouth daily., Disp: , Rfl:  .  glucosamine-chondroitin 500-400 MG tablet, Take 1 tablet by mouth daily. , Disp: , Rfl:  .  loratadine (KLS ALLERCLEAR) 10 MG tablet, Take 10 mg by mouth daily., Disp: , Rfl:  .  MAGNESIUM CITRATE PO, Take 250 mg by mouth daily. 1 tab daily, Disp: , Rfl:  .  metoprolol tartrate (LOPRESSOR) 25 MG tablet, Take 0.5 tablets (12.5 mg total) by mouth 2 (two) times daily., Disp: 90 tablet, Rfl: 3 .  pantoprazole (PROTONIX) 40 MG tablet, TAKE 1 TABLET BY MOUTH DAILY, Disp: 60 tablet, Rfl: 1 .  Resveratrol 250 MG CAPS, Take 250 mg by mouth daily., Disp: , Rfl:  .  sertraline (ZOLOFT) 100 MG tablet, Take 1  tablet (100 mg total) by mouth daily., Disp: 90 tablet, Rfl: 0 .  Simethicone (GAS-X PO), Take 2 tablets by mouth daily as needed (bloating)., Disp: , Rfl:   Past Medical History: Past Medical History:  Diagnosis Date  . Alpha-1-antitrypsin deficiency carrier ([place])   . Arthritis    "all over"  . Atrial fibrillation ([place])   . Complication of anesthesia    "I woke up during 2 different procedures" (10/04/2014)  . Frequent UTI   . GERD (gastroesophageal reflux disease)   . Hypertension   . MVP (mitral valve prolapse)   . Obesity   . Presence of permanent cardiac pacemaker   . PVC's (premature ventricular contractions)   . 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 Stroke [facility]) 01/2014   denies deficits on 10/04/2014  . Tachy-brady syndrome ([place]) 10/2014    Tobacco Use: History  Smoking Status  . Former Smoker  . Packs/day: 2.00  . Years: 18.00  . Types: Cigarettes  Smokeless Tobacco  . Never Used    Comment: "quit smoking in 1971"    Labs: Recent Review Flowsheet Data    Labs for ITP Cardiac and Pulmonary Rehab Latest Ref Rng & Units 03/08/2014 03/09/2014 10/25/2014 07/15/2016 01/15/2017  Cholestrol 0 - 200 mg/dL - 152 184 163 182   LDLCALC 0 - 99 mg/dL - 77 138 - 94   LDLDIRECT mg/dL - - - 81.0 -   HDL >39.00 mg/dL - 58 46 45.70 54.60   Trlycerides 0.0 - 149.0 mg/dL - 87 210(A) 208.0(H) 168.0(H)   Hemoglobin A1c <5.7 % - 5.9(H) - - -   TCO2 0 - 100 mmol/L 26 - - - -      Capillary Blood Glucose: Lab Results  Component Value Date   GLUCAP 103 (H) 03/09/2014   GLUCAP 103 (H) 03/09/2014   GLUCAP 112 (H) 03/09/2014       POCT Glucose    Row Name 07/15/17 1555             POCT Blood Glucose   Pre-Exercise 220 mg/dL       Post-Exercise 165 mg/dL          Pulmonary Assessment Scores:     Pulmonary Assessment Scores    Row Name 06/19/17 0728         ADL UCSD   ADL Phase Entry       mMRC Score   mMRC Score 4         Pulmonary Function Assessment:     Pulmonary Function Assessment - 06/09/17 1315      Breath   Bilateral Breath Sounds Clear   Shortness of Breath Yes;Limiting activity;Fear of Shortness of Breath      Exercise Target Goals:    Exercise Program Goal: Individual exercise prescription set with THRR, safety & activity barriers. Participant demonstrates ability to understand and report RPE using BORG scale, to self-measure pulse accurately, and to acknowledge the importance of the exercise prescription.  Exercise Prescription Goal: Starting with aerobic activity 30 plus minutes a day, 3 days per week for initial exercise prescription. Provide home exercise prescription and guidelines that participant acknowledges understanding prior to discharge.  Activity Barriers & Risk Stratification:   6 Minute Walk:     6 Minute Walk    Row Name 06/19/17 0723         6 Minute Walk   Phase Initial     Distance 800 feet     Walk Time -  4 minutes 50 seconds total     # of Rest Breaks 2  1st: 40 seconds 2nd: 30 seconds     MPH 1.5     METS 2.15     RPE 13     Perceived Dyspnea  3     Symptoms Yes (comment)     Comments used wheelchair/5/10 hip pain/limited by shortness of breath     Resting HR 90 bpm     Resting BP 105/66     Max Ex. HR 127 bpm     Max Ex. BP 105/65       Interval HR   Baseline HR (retired) 90     1 Minute HR 144     2 Minute HR 127     3 Minute HR 102     4 Minute HR 98     5 Minute HR 98     6 Minute HR 102     2 Minute Post HR 95     Interval Heart Rate? Yes       Interval Oxygen   Interval Oxygen? Yes     Baseline Oxygen Saturation % 94 %     Resting Liters of Oxygen 0 L  1 Minute Oxygen Saturation % 93 %     1 Minute Liters of Oxygen 0 L     2 Minute Oxygen Saturation % 93 %     2 Minute Liters of Oxygen 0 L     3 Minute Oxygen Saturation % 93 %     3 Minute Liters of Oxygen 0 L     4 Minute Oxygen Saturation % 94 %     4 Minute  Liters of Oxygen 0 L     5 Minute Oxygen Saturation % 92 %     5 Minute Liters of Oxygen 0 L     6 Minute Oxygen Saturation % 92 %     6 Minute Liters of Oxygen 0 L     2 Minute Post Oxygen Saturation % 93 %     2 Minute Post Liters of Oxygen 0 L        Oxygen Initial Assessment:     Oxygen Initial Assessment - 06/19/17 0726      Initial 6 min Walk   Oxygen Used None   Resting Oxygen Saturation  94 %   Exercise Oxygen Saturation  during 6 min walk 92 %     Program Oxygen Prescription   Program Oxygen Prescription None      Oxygen Re-Evaluation:     Oxygen Re-Evaluation    Row Name 07/01/17 1924 07/30/17 2103 08/22/17 1357         Program Oxygen Prescription   Program Oxygen Prescription None None None       Home Oxygen   Home Oxygen Device None None None     Sleep Oxygen Prescription CPAP CPAP CPAP     Liters per minute 0  -  -     Home Exercise Oxygen Prescription None None None     Home at Rest Exercise Oxygen Prescription None None None     Compliance with Home Oxygen Use Yes  -  -        Oxygen Discharge (Final Oxygen Re-Evaluation):     Oxygen Re-Evaluation - 08/22/17 1357      Program Oxygen Prescription   Program Oxygen Prescription None     Home Oxygen   Home Oxygen Device None   Sleep Oxygen Prescription CPAP   Home Exercise Oxygen Prescription None   Home at Rest Exercise Oxygen Prescription None      Initial Exercise Prescription:     Initial Exercise Prescription - 06/19/17 0700      Date of Initial Exercise RX and Referring Provider   Date 06/19/17   Referring Provider Dr. Annamaria Boots     Recumbant Bike   Level 1   Minutes 17     NuStep   Level 2   Minutes 17   METs 1.5     Track   Laps 4   Minutes 17     Prescription Details   Frequency (times per week) 2   Duration Progress to 45 minutes of aerobic exercise without signs/symptoms of physical distress     Intensity   THRR 40-80% of Max Heartrate 58-115   Ratings of  Perceived Exertion 11-13   Perceived Dyspnea 0-4     Progression   Progression Continue progressive overload as per policy without signs/symptoms or physical distress.     Resistance Training   Training Prescription Yes   Weight orange bands   Reps 10-15      Perform Capillary Blood Glucose checks as needed.  Exercise  Prescription Changes:     Exercise Prescription Changes    Row Name 06/24/17 1500 06/26/17 1500 07/01/17 1500 07/03/17 1500 07/08/17 1500     Response to Exercise   Blood Pressure (Admit) 104/60 120/60 100/56 96/60 110/64   Blood Pressure (Exercise) 124/70 130/84 122/82 130/86 118/70   Blood Pressure (Exit) 100/60 108/74 104/60 126/60 108/78   Heart Rate (Admit) 79 bpm 78 bpm 97 bpm 79 bpm 72 bpm   Heart Rate (Exercise) 105 bpm 94 bpm 107 bpm 105 bpm 94 bpm   Heart Rate (Exit) 83 bpm 70 bpm 79 bpm 71 bpm 76 bpm   Oxygen Saturation (Admit) 94 % 94 % 96 % 96 % 94 %   Oxygen Saturation (Exercise) 94 % 93 % 93 % 87 %  Sat increased to 92% with rest 93 %  Sat increased to 92% with rest   Oxygen Saturation (Exit) 93 % 95 % 94 % 93 % 96 %   Rating of Perceived Exertion (Exercise) _0 Perceived Dyspnea (Exercise) _1 Duration Progress to 45 minutes of aerobic exercise without signs/symptoms of physical distress Progress to 45 minutes of aerobic exercise without signs/symptoms of physical distress Progress to 45 minutes of aerobic exercise without signs/symptoms of physical distress Progress to 45 minutes of aerobic exercise without signs/symptoms of physical distress Progress to 45 minutes of aerobic exercise without signs/symptoms of physical distress   Intensity Other (comment)  40-8-% of HRR THRR unchanged THRR unchanged THRR unchanged THRR unchanged     Progression   Progression Continue to progress workloads to maintain intensity without signs/symptoms of physical distress. Continue to progress workloads to maintain intensity without  signs/symptoms of physical distress. Continue to progress workloads to maintain intensity without signs/symptoms of physical distress. Continue to progress workloads to maintain intensity without signs/symptoms of physical distress. Continue to progress workloads to maintain intensity without signs/symptoms of physical distress.     Resistance Training   Training Prescription _2    Weight _3    Reps 10-15 10-15 10-15 10-15 10-15   Time 10 Minutes 10 Minutes 10 Minutes 10 Minutes 10 Minutes     NuStep   Level _4 - 2   Minutes _5 - 17   METs 1.9 1.1 1.9  - 1.8     Arm Ergometer   Level _6 Minutes _7 Track   Laps 5  - _8 Minutes 17  - _9 Row Name 07/10/17 1500 07/15/17 1500 07/17/17 1500 07/24/17 1605 07/31/17 1600     Response to Exercise   Blood Pressure (Admit) _10 104/70 110/62   Blood Pressure (Exercise) 112/86 110/74 98/50 108/64 114/60   Blood Pressure (Exit) 100/60 100/64 100/60 104/60 98/0   Heart Rate (Admit) 75 bpm 70 bpm 80 bpm 74 bpm 80 bpm   Heart Rate (Exercise) 88 bpm 77 bpm 94 bpm 88 bpm 88 bpm   Heart Rate (Exit) 74 bpm 92 bpm 75 bpm 86 bpm 83 bpm   Oxygen Saturation (Admit) 96 % 93 % 96 % 93 % 94 %   Oxygen Saturation (Exercise) 95 % 94 % 90 % 94 % 95 %   Oxygen Saturation (Exit) 96 % 96 % 95 % 96 %  93 %   Rating of Perceived Exertion (Exercise) _0 Perceived Dyspnea (Exercise) _1 Duration Progress to 45 minutes of aerobic exercise without signs/symptoms of physical distress Progress to 45 minutes of aerobic exercise without signs/symptoms of physical distress Progress to 45 minutes of aerobic exercise without signs/symptoms of physical distress Progress to 45 minutes of aerobic exercise without signs/symptoms of physical distress Progress to 45 minutes of aerobic exercise without signs/symptoms of  physical distress   Intensity _2      Progression   Progression Continue to progress workloads to maintain intensity without signs/symptoms of physical distress. Continue to progress workloads to maintain intensity without signs/symptoms of physical distress. Continue to progress workloads to maintain intensity without signs/symptoms of physical distress. Continue to progress workloads to maintain intensity without signs/symptoms of physical distress. Continue to progress workloads to maintain intensity without signs/symptoms of physical distress.     Resistance Training   Training Prescription _3    Weight _4    Reps 10-15 10-15 10-15 10-15 10-15   Time 10 Minutes 10 Minutes 10 Minutes 10 Minutes 10 Minutes     NuStep   Level 3 4  - 4 5   Minutes 17 17  - 17 17   METs 2.4 2  - 1.4 1.7     Arm Ergometer   Level 3 3  - 5 6   Minutes 17 17  - 17 17   Row Name 08/05/17 1500 08/12/17 1559 08/14/17 1500 08/14/17 1546 08/19/17 1555     Response to Exercise   Blood Pressure (Admit) 100/60 100/62  - 98/62 98/62   Blood Pressure (Exercise) 122/66 104/62  - 98/56 97/67   Blood Pressure (Exit) 110/60 104/64  - 100/54 114/74   Heart Rate (Admit) 76 bpm 70 bpm  - 77 bpm 68 bpm   Heart Rate (Exercise) 94 bpm 99 bpm  - 86 bpm 100 bpm   Heart Rate (Exit) 65 bpm 70 bpm  - 82 bpm 90 bpm   Oxygen Saturation (Admit) 96 % 95 %  - 95 % 94 %   Oxygen Saturation (Exercise) 94 % 96 %  - 95 % 94 %   Oxygen Saturation (Exit) 91 % 95 %  - 96 % 94 %   Rating of Perceived Exertion (Exercise) 14 13  - 12 14   Perceived Dyspnea (Exercise) 3 3  - 2 3   Duration Progress to 45 minutes of aerobic exercise without signs/symptoms of physical distress Progress to 45 minutes of aerobic exercise without signs/symptoms of physical distress  - Progress to 45 minutes of aerobic  exercise without signs/symptoms of physical distress Progress to 45 minutes of aerobic exercise without signs/symptoms of physical distress   Intensity THRR unchanged THRR unchanged  - THRR unchanged THRR unchanged     Progression   Progression Continue to progress workloads to maintain intensity without signs/symptoms of physical distress. Continue to progress workloads to maintain intensity without signs/symptoms of physical distress.  - Continue to progress workloads to maintain intensity without signs/symptoms of physical distress. Continue to progress workloads to maintain intensity without signs/symptoms of physical distress.     Resistance Training   Training Prescription Yes Yes  - Yes Yes   Weight orange bands orange bands  - orange bands orange bands   Reps 10-15  10-15  - 10-15 10-15   Time 10 Minutes 10 Minutes  - 10 Minutes 10 Minutes     NuStep   Level 5 5  - 5 5   Minutes 17 17  - 51 17   METs 2.2 1.9  - 2 2.1     Arm Ergometer   Level 6.5 6.5  -  - 6   Minutes 17 17  -  - 17     Track   Laps  -  -  -  - 8   Minutes  -  -  -  - 17     Home Exercise Plan   Plans to continue exercise at  -  - [employer] (comment)  water treading at [employer] and walking at home  -  -   Frequency  -  - Add 2 additional days to program exercise sessions.  -  -   Row Name 08/21/17 1600             Response to Exercise   Blood Pressure (Admit) 92/68       Blood Pressure (Exercise) 112/62       Blood Pressure (Exit) 100/80       Heart Rate (Admit) 80 bpm       Heart Rate (Exercise) 98 bpm       Heart Rate (Exit) 86 bpm       Oxygen Saturation (Admit) 96 %       Oxygen Saturation (Exercise) 95 %       Oxygen Saturation (Exit) 96 %       Rating of Perceived Exertion (Exercise) 13       Perceived Dyspnea (Exercise) 2       Duration Progress to 45 minutes of aerobic exercise without signs/symptoms of physical distress       Intensity THRR unchanged         Progression    Progression Continue to progress workloads to maintain intensity without signs/symptoms of physical distress.         Resistance Training   Training Prescription Yes       Weight orange bands       Reps 10-15       Time 10 Minutes         NuStep   Level 5       Minutes 17       METs 1.2         Arm Ergometer   Level 6       Minutes 17          Exercise Comments:     Exercise Comments    Row Name 08/14/17 1530           Exercise Comments home exercise completed          Exercise Goals and Review:   Exercise Goals Re-Evaluation :     Exercise Goals Re-Evaluation    [place] Name 06/27/17 1042 07/29/17 1005 08/18/17 1402         Exercise Goal Re-Evaluation   Exercise Goals Review Increase Physical Activity;Increase Strenth and Stamina Increase Physical Activity;Increase Strength and Stamina;Able to understand and use Dyspnea scale;Able to understand and use rate of perceived exertion (RPE) scale;Knowledge and understanding of Target Heart Rate Range (THRR);Understanding of Exercise Prescription Increase Strength and Stamina;Increase Physical Activity;Able to understand and use Dyspnea scale;Able to understand and use rate of perceived exertion (RPE) scale;Knowledge and understanding of Target Heart Rate Range (THRR);Understanding of  Exercise Prescription     Comments Patient has only attended two exercise sessions. Will cont. to monitor and progress as able.  Patient is progressing slowly in program. She is limited by orthopedic issues. Will cont. to progress as able. Patient is doing well in program. She is showing new initiative to push herself. Home exercise completed. Hesitant to exercise at home. Will cont. to monitor and motivate patient.      Expected Outcomes Through exercising at rehab and at home, patient will increase physical strength and stamina and find ADL's easier to perform.  Through exercising at rehab and at home, patient will increase physical strength and  stamina and find ADL's easier to perform.  Through exercising at rehab and at home, patient will increase physical strength and stamina and find ADL's easier to perform.         Discharge Exercise Prescription (Final Exercise Prescription Changes):     Exercise Prescription Changes - 08/21/17 1600      Response to Exercise   Blood Pressure (Admit) 92/68   Blood Pressure (Exercise) 112/62   Blood Pressure (Exit) 100/80   Heart Rate (Admit) 80 bpm   Heart Rate (Exercise) 98 bpm   Heart Rate (Exit) 86 bpm   Oxygen Saturation (Admit) 96 %   Oxygen Saturation (Exercise) 95 %   Oxygen Saturation (Exit) 96 %   Rating of Perceived Exertion (Exercise) 13   Perceived Dyspnea (Exercise) 2   Duration Progress to 45 minutes of aerobic exercise without signs/symptoms of physical distress   Intensity THRR unchanged     Progression   Progression Continue to progress workloads to maintain intensity without signs/symptoms of physical distress.     Resistance Training   Training Prescription Yes   Weight orange bands   Reps 10-15   Time 10 Minutes     NuStep   Level 5   Minutes 17   METs 1.2     Arm Ergometer   Level 6   Minutes 17      Nutrition:  Target Goals: Understanding of nutrition guidelines, daily intake of sodium <153m, cholesterol <2043m calories 30% from fat and 7% or less from saturated fats, daily to have 5 or more servings of fruits and vegetables.  Biometrics:    Nutrition Therapy Plan and Nutrition Goals:     Nutrition Therapy & Goals - 06/19/17 0842      Nutrition Therapy   Diet General, Healthful     Personal Nutrition Goals   Nutrition Goal Identify food quantities necessary to achieve wt loss of  -2# per week to a goal wt loss of 2.7-10.9 kg (6-24 lb) at graduation from pulmonary rehab.     Intervention Plan   Intervention Prescribe, educate and counsel regarding individualized specific dietary modifications aiming towards targeted core components  such as weight, hypertension, lipid management, diabetes, heart failure and other comorbidities.   Expected Outcomes Short Term Goal: Understand basic principles of dietary content, such as calories, fat, sodium, cholesterol and nutrients.;Long Term Goal: Adherence to prescribed nutrition plan.      Nutrition Discharge: Rate Your Plate Scores:     Nutrition Assessments - 06/19/17 0836      Rate Your Plate Scores   Pre Score 46      Nutrition Goals Re-Evaluation:   Nutrition Goals Discharge (Final Nutrition Goals Re-Evaluation):   Psychosocial: Target Goals: Acknowledge presence or absence of significant depression and/or stress, maximize coping skills, provide positive support system. Participant is able to verbalize types  and ability to use techniques and skills needed for reducing stress and depression.  Initial Review & Psychosocial Screening:     Initial Psych Review & Screening - 06/09/17 1328      Initial Review   Current issues with None Identified     Family Dynamics   Good Support System? Yes     Barriers   Psychosocial barriers to participate in program There are no identifiable barriers or psychosocial needs.     Screening Interventions   Interventions Encouraged to exercise      Quality of Life Scores:   PHQ-9: Recent Review Flowsheet Data    Depression screen [facility] 2/9 06/09/2017 01/15/2017 07/15/2016   Decreased Interest 3 0 0   Down, Depressed, Hopeless - 0  0   PHQ - 2 Score 3 0 0   Altered sleeping 0 - -   Tired, decreased energy 3 - -   Change in appetite 3 - -   Feeling bad or failure about yourself  3 - -   Trouble concentrating 0 - -   Moving slowly or fidgety/restless 0 - -   Suicidal thoughts 0 - -   PHQ-9 Score 12 - -   Difficult doing work/chores Not difficult at all - -     Interpretation of Total Score  Total Score Depression Severity:  1-4 = Minimal depression, 5-9 = Mild depression, 10-14 = Moderate depression, 15-19 = Moderately  severe depression, 20-27 = Severe depression   Psychosocial Evaluation and Intervention:     Psychosocial Evaluation - 06/09/17 1330      Psychosocial Evaluation & Interventions   Interventions Encouraged to exercise with the program and follow exercise prescription   Comments dealing depression, but is working on making herself happy.   Continue Psychosocial Services  No Follow up required      Psychosocial Re-Evaluation:     Psychosocial Re-Evaluation    [place] Name 07/01/17 1926 07/30/17 2105 08/22/17 1357         Psychosocial Re-Evaluation   Current issues with None Identified None Identified Current Stress Concerns     Comments  -  - patient worries about dauther who has terminal pulmonary disease and requires high flow oxygen. She worries that she is unable to help her daughter because of her own shortness of breath and deconditioning.      Expected Outcomes  - patient will remain free from psychosocial barriers to participation patient will remain free from psychosocial barriers to participation     Interventions  - Encouraged to attend Pulmonary Rehabilitation for the exercise Encouraged to attend Pulmonary Rehabilitation for the exercise     Continue Psychosocial Services   - No Follow up required No Follow up required     Comments  -  - daughters terminal pulmonary illness       Initial Review   Source of Stress Concerns  -  - Family        Psychosocial Discharge (Final Psychosocial Re-Evaluation):     Psychosocial Re-Evaluation - 08/22/17 1357      Psychosocial Re-Evaluation   Current issues with Current Stress Concerns   Comments patient worries about dauther who has terminal pulmonary disease and requires high flow oxygen. She worries that she is unable to help her daughter because of her own shortness of breath and deconditioning.    Expected Outcomes patient will remain free from psychosocial barriers to participation   Interventions Encouraged to attend  Pulmonary Rehabilitation for the exercise  Continue Psychosocial Services  No Follow up required   Comments daughters terminal pulmonary illness     Initial Review   Source of Stress Concerns Family      Education: Education Goals: Education classes will be provided on a weekly basis, covering required topics. Participant will state understanding/return demonstration of topics presented.  Learning Barriers/Preferences:     Learning Barriers/Preferences - 06/09/17 1314      Learning Barriers/Preferences   Learning Barriers None   Learning Preferences Pictoral      Education Topics: Risk Factor Reduction:  -Group instruction that is supported by a PowerPoint presentation. Instructor discusses the definition of a risk factor, different risk factors for pulmonary disease, and how the heart and lungs work together.     PULMONARY REHAB OTHER RESPIRATORY from 08/21/2017 in [place]  Date  06/26/17  Educator  ep  Instruction Review Code  2- meets goals/outcomes      Nutrition for Pulmonary Patient:  -Group instruction provided by PowerPoint slides, verbal discussion, and written materials to support subject matter. The instructor gives an explanation and review of healthy diet recommendations, which includes a discussion on weight management, recommendations for fruit and vegetable consumption, as well as protein, fluid, caffeine, fiber, sodium, sugar, and alcohol. Tips for eating when patients are short of breath are discussed.   PULMONARY REHAB OTHER RESPIRATORY from 08/21/2017 in [place]  Date  07/24/17  Educator  edna  Instruction Review Code  2- meets goals/outcomes      Pursed Lip Breathing:  -Group instruction that is supported by demonstration and informational handouts. Instructor discusses the benefits of pursed lip and diaphragmatic breathing and detailed demonstration on how to preform both.      Oxygen Safety:  -Group instruction provided by PowerPoint, verbal discussion, and written material to support subject matter. There is an overview of "What is Oxygen" and "Why do we need it".  Instructor also reviews how to create a safe environment for oxygen use, the importance of using oxygen as prescribed, and the risks of noncompliance. There is a brief discussion on traveling with oxygen and resources the patient may utilize.   PULMONARY REHAB OTHER RESPIRATORY from 08/21/2017 in [place]  Date  07/10/17  Educator  Truddie Crumble  Instruction Review Code  2- meets goals/outcomes      Oxygen Equipment:  -Group instruction provided by [employer] Staff utilizing handouts, written materials, and equipment demonstrations.   Signs and Symptoms:  -Group instruction provided by written material and verbal discussion to support subject matter. Warning signs and symptoms of infection, stroke, and heart attack are reviewed and when to call the physician/911 reinforced. Tips for preventing the spread of infection discussed.   PULMONARY REHAB OTHER RESPIRATORY from 08/21/2017 in [place]  Date  08/21/17  Educator  rn  Instruction Review Code  2- meets goals/outcomes      Advanced Directives:  -Group instruction provided by verbal instruction and written material to support subject matter. Instructor reviews Advanced Directive laws and proper instruction for filling out document.   Pulmonary Video:  -Group video education that reviews the importance of medication and oxygen compliance, exercise, good nutrition, pulmonary hygiene, and pursed lip and diaphragmatic breathing for the pulmonary patient.   Exercise for the Pulmonary Patient:  -Group instruction that is supported by a PowerPoint presentation. Instructor discusses benefits of exercise, core components of exercise, frequency, duration, and intensity  of an exercise  routine, importance of utilizing pulse oximetry during exercise, safety while exercising, and options of places to exercise outside of rehab.     PULMONARY REHAB OTHER RESPIRATORY from 08/21/2017 in [place]  Date  08/12/17  Educator  ep  Instruction Review Code  2- meets goals/outcomes      Pulmonary Medications:  -Verbally interactive group education provided by instructor with focus on inhaled medications and proper administration.   PULMONARY REHAB OTHER RESPIRATORY from 08/21/2017 in [place]  Date  07/31/17  Educator  pharmacist  Instruction Review Code  2- meets goals/outcomes      Anatomy and Physiology of the Respiratory System and Intimacy:  -Group instruction provided by PowerPoint, verbal discussion, and written material to support subject matter. Instructor reviews respiratory cycle and anatomical components of the respiratory system and their functions. Instructor also reviews differences in obstructive and restrictive respiratory diseases with examples of each. Intimacy, Sex, and Sexuality differences are reviewed with a discussion on how relationships can change when diagnosed with pulmonary disease. Common sexual concerns are reviewed.   MD DAY -A group question and answer session with a medical doctor that allows participants to ask questions that relate to their pulmonary disease state.   OTHER EDUCATION -Group or individual verbal, written, or video instructions that support the educational goals of the pulmonary rehab program.   Knowledge Questionnaire Score:   Core Components/Risk Factors/Patient Goals at Admission:     Personal Goals and Risk Factors at Admission - 06/09/17 1325      Core Components/Risk Factors/Patient Goals on Admission    Weight Management Obesity;Yes   Intervention Weight Management: Develop a combined nutrition and exercise program designed to reach desired caloric  intake, while maintaining appropriate intake of nutrient and fiber, sodium and fats, and appropriate energy expenditure required for the weight goal.   Admit Weight 276 lb 0.3 oz (125.2 kg)   Goal Weight: Short Term 271 lb 2.7 oz (123 kg)   Goal Weight: Long Term 264 lb 8.8 oz (120 kg)   Expected Outcomes Short Term: Continue to assess and modify interventions until short term weight is achieved   Improve shortness of breath with ADL's Yes   Intervention Provide education, individualized exercise plan and daily activity instruction to help decrease symptoms of SOB with activities of daily living.   Expected Outcomes Short Term: Achieves a reduction of symptoms when performing activities of daily living.   Develop more efficient breathing techniques such as purse lipped breathing and diaphragmatic breathing; and practicing self-pacing with activity Yes   Intervention Provide education, demonstration and support about specific breathing techniuqes utilized for more efficient breathing. Include techniques such as pursed lipped breathing, diaphragmatic breathing and self-pacing activity.   Expected Outcomes Short Term: Participant will be able to demonstrate and use breathing techniques as needed throughout daily activities.      Core Components/Risk Factors/Patient Goals Review:      Goals and Risk Factor Review    Row Name 07/01/17 1924 07/30/17 2104 08/22/17 1357         Core Components/Risk Factors/Patient Goals Review   Personal Goals Review  - Weight Management/Obesity;Improve shortness of breath with ADL's;Develop more efficient breathing techniques such as purse lipped breathing and diaphragmatic breathing and practicing self-pacing with activity. Weight Management/Obesity;Improve shortness of breath with ADL's;Develop more efficient breathing techniques such as purse lipped breathing and diaphragmatic breathing and practicing self-pacing with activity.  Review patient has only  attended 3 exercise sessions since admission and is too soon to evaluate progression towards rehab goals patient doing well in rehab. tolerating workload increases and utilizing PLB for her shortness of breath patient doing well in rehab. tolerating workload increases and utilizing PLB for her shortness of breath     Expected Outcomes see admission expected outcomes see admission expected outcomes see admission expected outcomes        Core Components/Risk Factors/Patient Goals at Discharge (Final Review):      Goals and Risk Factor Review - 08/22/17 1357      Core Components/Risk Factors/Patient Goals Review   Personal Goals Review Weight Management/Obesity;Improve shortness of breath with ADL's;Develop more efficient breathing techniques such as purse lipped breathing and diaphragmatic breathing and practicing self-pacing with activity.   Review patient doing well in rehab. tolerating workload increases and utilizing PLB for her shortness of breath   Expected Outcomes see admission expected outcomes      ITP Comments:   Comments: ITP REVIEW Pt is making expected progress toward pulmonary rehab goals after completing 15 sessions. Recommend continued exercise, life style modification, education, and utilization of breathing techniques to increase stamina and strength and decrease shortness of breath with exertion.

## 2017-08-26 ENCOUNTER — Encounter (HOSPITAL_COMMUNITY)
Admission: RE | Admit: 2017-08-26 | Discharge: 2017-08-26 | Disposition: A | Discharge status: Home / Self Care | Payer: Medicare Other | Source: Ambulatory Visit | Attending: Internal Medicine | Admitting: Internal Medicine

## 2017-08-26 ENCOUNTER — Encounter (INDEPENDENT_AMBULATORY_CARE_PROVIDER_SITE_OTHER): Payer: Medicare Other

## 2017-08-26 VITALS — Wt 276.7 lb

## 2017-08-26 DIAGNOSIS — R0609 Other forms of dyspnea: Secondary | ICD-10-CM | POA: Diagnosis not present

## 2017-08-26 DIAGNOSIS — Z7901 Long term (current) use of anticoagulants: Secondary | ICD-10-CM | POA: Diagnosis not present

## 2017-08-26 DIAGNOSIS — Z7951 Long term (current) use of inhaled steroids: Secondary | ICD-10-CM | POA: Diagnosis not present

## 2017-08-26 DIAGNOSIS — Z950 Presence of cardiac pacemaker: Secondary | ICD-10-CM | POA: Diagnosis not present

## 2017-08-26 DIAGNOSIS — M1990 Unspecified osteoarthritis, unspecified site: Secondary | ICD-10-CM | POA: Diagnosis not present

## 2017-08-26 DIAGNOSIS — Z79899 Other long term (current) drug therapy: Secondary | ICD-10-CM | POA: Diagnosis not present

## 2017-08-26 NOTE — Progress Notes (Signed)
Daily Session Note  Patient Details  Name: [name] [name] MRN: [id number] Date of Birth: 1941/05/28 Referring Provider:     Pulmonary Rehab Walk Test from 06/17/2017 in [place]  Referring Provider  Dr. Annamaria Boots      Encounter Date: 08/26/2017  Check In:     Session Check In - 08/26/17 1330      Check-In   Location [facility]   Staff Present Su Hilt, MS, ACSM RCEP, Exercise Physiologist;Portia Rollene Rotunda, RN, Roque Cash, RN   Supervising physician immediately available to respond to emergencies [facility] immediately available   Physician(s) Dr. Wynelle Cleveland   Medication changes reported     No   Fall or balance concerns reported    No   Tobacco Cessation No Change   Warm-up and Cool-down Performed as group-led instruction   Resistance Training Performed Yes   VAD Patient? No     Pain Assessment   Currently in Pain? No/denies   Multiple Pain Sites No      Capillary Blood Glucose: No results found for this or any previous visit (from the past 24 hour(s)).      Exercise Prescription Changes - 08/26/17 1500      Response to Exercise   Blood Pressure (Admit) 104/64   Blood Pressure (Exercise) 110/60   Blood Pressure (Exit) 118/62   Heart Rate (Admit) 83 bpm   Heart Rate (Exercise) 110 bpm   Heart Rate (Exit) 78 bpm   Oxygen Saturation (Admit) 95 %   Oxygen Saturation (Exercise) 93 %   Oxygen Saturation (Exit) 95 %   Rating of Perceived Exertion (Exercise) 13   Perceived Dyspnea (Exercise) 2   Duration Progress to 45 minutes of aerobic exercise without signs/symptoms of physical distress   Intensity THRR unchanged     Progression   Progression Continue to progress workloads to maintain intensity without signs/symptoms of physical distress.     Resistance Training   Training Prescription Yes   Weight orange bands   Reps 10-15   Time 10 Minutes     NuStep   Level 5   Minutes 17   METs 2.3     Arm  Ergometer   Level 6   Minutes 17     Track   Laps 9   Minutes 17      History  Smoking Status  . Former Smoker  . Packs/day: 2.00  . Years: 18.00  . Types: Cigarettes  Smokeless Tobacco  . Never Used    Comment: "quit smoking in 1971"    Goals Met:  Exercise tolerated well No report of cardiac concerns or symptoms Strength training completed today  Goals Unmet:  Not Applicable  Comments: Service time is from 1330 to 1455    Dr. Rush Farmer is Medical Director for Pulmonary Rehab at [facility].

## 2017-08-27 ENCOUNTER — Telehealth: Payer: Self-pay | Admitting: Cardiovascular Disease

## 2017-08-27 ENCOUNTER — Telehealth: Payer: Self-pay | Admitting: Cardiology

## 2017-08-27 DIAGNOSIS — I480 Paroxysmal atrial fibrillation: Secondary | ICD-10-CM | POA: Diagnosis not present

## 2017-08-27 NOTE — Telephone Encounter (Signed)
Returned call to patient, I spoke to [name] [name] she advised cardioversion scheduled as planned.Advised to have lab work today.

## 2017-08-27 NOTE — Telephone Encounter (Signed)
New message    Patient concerned about cardioversion. Has questions about what to expect and how far in advance should she get blood work. Please call

## 2017-08-27 NOTE — Telephone Encounter (Signed)
Patient called and stated that she is supposed to have a cardioversion on 09-02-17 and she wants to send a remote transmission before going to the hospital. I informed pt that she can send a remote transmission on 09-02-17 at 8:00 AM and to call our office after the transmission is completed. Patient verbalized understanding.

## 2017-08-27 NOTE — Telephone Encounter (Signed)
Returned call to patient she was calling to ask [name] [name] if cardioversion still scheduled for 10/2.Stated she is suppose to have lab work today.Advised I will send message to [facility].

## 2017-08-28 ENCOUNTER — Encounter (HOSPITAL_COMMUNITY)
Admission: RE | Admit: 2017-08-28 | Discharge: 2017-08-28 | Disposition: A | Discharge status: Home / Self Care | Payer: Medicare Other | Source: Ambulatory Visit | Attending: Internal Medicine | Admitting: Internal Medicine

## 2017-08-28 VITALS — Wt 276.2 lb

## 2017-08-28 DIAGNOSIS — Z950 Presence of cardiac pacemaker: Secondary | ICD-10-CM | POA: Diagnosis not present

## 2017-08-28 DIAGNOSIS — M1990 Unspecified osteoarthritis, unspecified site: Secondary | ICD-10-CM | POA: Diagnosis not present

## 2017-08-28 DIAGNOSIS — R0609 Other forms of dyspnea: Principal | ICD-10-CM

## 2017-08-28 DIAGNOSIS — Z7901 Long term (current) use of anticoagulants: Secondary | ICD-10-CM | POA: Diagnosis not present

## 2017-08-28 DIAGNOSIS — Z7951 Long term (current) use of inhaled steroids: Secondary | ICD-10-CM | POA: Diagnosis not present

## 2017-08-28 DIAGNOSIS — Z79899 Other long term (current) drug therapy: Secondary | ICD-10-CM | POA: Diagnosis not present

## 2017-08-28 LAB — CBC
HEMOGLOBIN: 15 g/dL (ref 11.1–15.9)
Hematocrit: 45.9 % (ref 34.0–46.6)
MCH: 28.7 pg (ref 26.6–33.0)
MCHC: 32.7 g/dL (ref 31.5–35.7)
MCV: 88 fL (ref 79–97)
Platelets: 258 10*3/uL (ref 150–379)
RBC: 5.22 x10E6/uL (ref 3.77–5.28)
RDW: 13.7 % (ref 12.3–15.4)
WBC: 9.2 10*3/uL (ref 3.4–10.8)

## 2017-08-28 LAB — BASIC METABOLIC PANEL
BUN/Creatinine Ratio: 16 (ref 12–28)
BUN: 16 mg/dL (ref 8–27)
CALCIUM: 9.2 mg/dL (ref 8.7–10.3)
CHLORIDE: 103 mmol/L (ref 96–106)
CO2: 25 mmol/L (ref 20–29)
Creatinine, Ser: 1.03 mg/dL — ABNORMAL HIGH (ref 0.57–1.00)
GFR calc Af Amer: 61 mL/min/{1.73_m2} (ref >59)
GFR, EST NON AFRICAN AMERICAN: 53 mL/min/{1.73_m2} — AB (ref >59)
Glucose: 80 mg/dL (ref 65–99)
POTASSIUM: 4.9 mmol/L (ref 3.5–5.2)
SODIUM: 140 mmol/L (ref 134–144)

## 2017-08-28 LAB — PROTIME-INR
INR: 1.1 (ref 0.8–1.2)
Prothrombin Time: 11.2 s (ref 9.1–12.0)

## 2017-08-28 NOTE — Progress Notes (Signed)
Daily Session Note  Patient Details  Name: [name] [name] MRN: [id number] Date of Birth: [birth year] Referring Provider:     Pulmonary Rehab Walk Test from 06/17/2017 in [place]  Referring Provider  Dr. Annamaria Boots      Encounter Date: 08/28/2017  Check In:     Session Check In - 08/28/17 1340      Check-In   Location [facility]   Staff Present Su Hilt, MS, ACSM RCEP, Exercise Physiologist;Portia Rollene Rotunda, RN, Roque Cash, RN   Supervising physician immediately available to respond to emergencies [facility] immediately available   Physician(s) Dr. Wendee Beavers   Medication changes reported     No   Fall or balance concerns reported    No   Tobacco Cessation No Change   Warm-up and Cool-down Performed as group-led instruction   Resistance Training Performed Yes   VAD Patient? No     Pain Assessment   Currently in Pain? No/denies   Multiple Pain Sites No      Capillary Blood Glucose: No results found for this or any previous visit (from the past 24 hour(s)).      Exercise Prescription Changes - 08/28/17 1500      Response to Exercise   Blood Pressure (Admit) 100/60   Blood Pressure (Exercise) 130/74   Blood Pressure (Exit) 98/64   Heart Rate (Admit) 71 bpm   Heart Rate (Exercise) 88 bpm   Heart Rate (Exit) 69 bpm   Oxygen Saturation (Admit) 96 %   Oxygen Saturation (Exercise) 95 %   Oxygen Saturation (Exit) 94 %   Rating of Perceived Exertion (Exercise) 11   Perceived Dyspnea (Exercise) 1   Duration Progress to 45 minutes of aerobic exercise without signs/symptoms of physical distress   Intensity THRR unchanged     Progression   Progression Continue to progress workloads to maintain intensity without signs/symptoms of physical distress.     Resistance Training   Training Prescription Yes   Weight orange bands   Reps 10-15   Time 10 Minutes     NuStep   Level 6   Minutes 17   METs 2     Arm  Ergometer   Level 6.5   Minutes 17      History  Smoking Status  . Former Smoker  . Packs/day: 2.00  . Years: 18.00  . Types: Cigarettes  Smokeless Tobacco  . Never Used    Comment: "quit smoking in 1971"    Goals Met:  Exercise tolerated well No report of cardiac concerns or symptoms Strength training completed today  Goals Unmet:  Not Applicable  Comments: Service time is from 1330 to 1505    Dr. Rush Farmer is Medical Director for Pulmonary Rehab at [facility].

## 2017-09-01 ENCOUNTER — Other Ambulatory Visit: Payer: Self-pay | Admitting: Family Medicine

## 2017-09-01 NOTE — Telephone Encounter (Signed)
Medication filled to pharmacy as requested.   

## 2017-09-01 NOTE — Telephone Encounter (Signed)
Last OV 04/21/17 Diazepam last filled 01/15/17 #30 with 1

## 2017-09-02 ENCOUNTER — Ambulatory Visit (HOSPITAL_COMMUNITY): Payer: Medicare Other | Admitting: Anesthesiology

## 2017-09-02 ENCOUNTER — Encounter (HOSPITAL_COMMUNITY): Payer: Self-pay | Admitting: Anesthesiology

## 2017-09-02 ENCOUNTER — Encounter (HOSPITAL_COMMUNITY): Payer: Medicare Other

## 2017-09-02 ENCOUNTER — Encounter (HOSPITAL_COMMUNITY)
Admission: RE | Disposition: A | Discharge status: Home / Self Care | Payer: Self-pay | Source: Ambulatory Visit | Attending: Cardiovascular Disease

## 2017-09-02 ENCOUNTER — Ambulatory Visit (HOSPITAL_COMMUNITY)
Admission: RE | Admit: 2017-09-02 | Discharge: 2017-09-02 | Disposition: A | Discharge status: Home / Self Care | Payer: Medicare Other | Source: Ambulatory Visit | Attending: Cardiovascular Disease | Admitting: Cardiovascular Disease

## 2017-09-02 ENCOUNTER — Ambulatory Visit (INDEPENDENT_AMBULATORY_CARE_PROVIDER_SITE_OTHER): Payer: Self-pay | Admitting: *Deleted

## 2017-09-02 DIAGNOSIS — K219 Gastro-esophageal reflux disease without esophagitis: Secondary | ICD-10-CM | POA: Diagnosis not present

## 2017-09-02 DIAGNOSIS — I480 Paroxysmal atrial fibrillation: Secondary | ICD-10-CM | POA: Diagnosis not present

## 2017-09-02 DIAGNOSIS — I10 Essential (primary) hypertension: Secondary | ICD-10-CM | POA: Insufficient documentation

## 2017-09-02 DIAGNOSIS — Z6841 Body Mass Index (BMI) 40.0 and over, adult: Secondary | ICD-10-CM | POA: Diagnosis not present

## 2017-09-02 DIAGNOSIS — I4819 Other persistent atrial fibrillation: Secondary | ICD-10-CM

## 2017-09-02 DIAGNOSIS — I481 Persistent atrial fibrillation: Secondary | ICD-10-CM | POA: Insufficient documentation

## 2017-09-02 DIAGNOSIS — Z8249 Family history of ischemic heart disease and other diseases of the circulatory system: Secondary | ICD-10-CM | POA: Insufficient documentation

## 2017-09-02 DIAGNOSIS — Z950 Presence of cardiac pacemaker: Secondary | ICD-10-CM

## 2017-09-02 DIAGNOSIS — I341 Nonrheumatic mitral (valve) prolapse: Secondary | ICD-10-CM | POA: Insufficient documentation

## 2017-09-02 DIAGNOSIS — J8489 Other specified interstitial pulmonary diseases: Secondary | ICD-10-CM | POA: Diagnosis not present

## 2017-09-02 DIAGNOSIS — Z7901 Long term (current) use of anticoagulants: Secondary | ICD-10-CM | POA: Insufficient documentation

## 2017-09-02 DIAGNOSIS — Z87440 Personal history of urinary (tract) infections: Secondary | ICD-10-CM | POA: Insufficient documentation

## 2017-09-02 DIAGNOSIS — Z8673 Personal history of transient ischemic attack (TIA), and cerebral infarction without residual deficits: Secondary | ICD-10-CM | POA: Insufficient documentation

## 2017-09-02 DIAGNOSIS — G4733 Obstructive sleep apnea (adult) (pediatric): Secondary | ICD-10-CM | POA: Diagnosis not present

## 2017-09-02 DIAGNOSIS — I499 Cardiac arrhythmia, unspecified: Secondary | ICD-10-CM | POA: Diagnosis not present

## 2017-09-02 DIAGNOSIS — Z87891 Personal history of nicotine dependence: Secondary | ICD-10-CM | POA: Diagnosis not present

## 2017-09-02 DIAGNOSIS — I495 Sick sinus syndrome: Secondary | ICD-10-CM | POA: Diagnosis not present

## 2017-09-02 DIAGNOSIS — G459 Transient cerebral ischemic attack, unspecified: Secondary | ICD-10-CM | POA: Diagnosis not present

## 2017-09-02 HISTORY — PX: CARDIOVERSION: SHX1299

## 2017-09-02 SURGERY — CARDIOVERSION
Anesthesia: General

## 2017-09-02 MED ORDER — PROPOFOL 10 MG/ML IV BOLUS
INTRAVENOUS | Status: DC | PRN
  Administered 2017-09-02: 60 mg via INTRAVENOUS

## 2017-09-02 MED ORDER — LIDOCAINE 2% (20 MG/ML) 5 ML SYRINGE
INTRAMUSCULAR | Status: DC | PRN
  Administered 2017-09-02: 40 mg via INTRAVENOUS

## 2017-09-02 MED ORDER — SODIUM CHLORIDE 0.9 % IV SOLN
INTRAVENOUS | Status: DC
  Administered 2017-09-02: 13:00:00 via INTRAVENOUS

## 2017-09-02 NOTE — Progress Notes (Signed)
Remote pacemaker transmission.   

## 2017-09-02 NOTE — Anesthesia Preprocedure Evaluation (Addendum)
Anesthesia Evaluation  Patient identified by MRN, date of birth, ID band Patient awake    Reviewed: Allergy & Precautions, NPO status , Patient's Chart, lab work & pertinent test results  Airway Mallampati: II  TM Distance: >3 FB Neck ROM: Full    Dental no notable dental hx. (+) Lower Dentures, Upper Dentures   Pulmonary former smoker,    Pulmonary exam normal breath sounds clear to auscultation       Cardiovascular hypertension, Normal cardiovascular exam Rhythm:Regular Rate:Normal     Neuro/Psych    GI/Hepatic   Endo/Other    Renal/GU      Musculoskeletal   Abdominal   Peds  Hematology   Anesthesia Other Findings   Reproductive/Obstetrics                                                             Anesthesia Evaluation  Patient identified by MRN, date of birth, ID band Patient awake    Reviewed: Allergy & Precautions, NPO status , Patient's Chart, lab work & pertinent test results  Airway Mallampati: II       Dental  (+) Lower Dentures, Upper Dentures   Pulmonary former smoker,           Cardiovascular hypertension,      Neuro/Psych    GI/Hepatic   Endo/Other    Renal/GU      Musculoskeletal   Abdominal   Peds  Hematology   Anesthesia Other Findings   Reproductive/Obstetrics                             Anesthesia Physical Anesthesia Plan  ASA: III  Anesthesia Plan: General   Post-op Pain Management:    Induction: Intravenous  PONV Risk Score and Plan:   Airway Management Planned: Mask  Additional Equipment:   Intra-op Plan:   Post-operative Plan:   Informed Consent: I have reviewed the patients History and Physical, chart, labs and discussed the procedure including the risks, benefits and alternatives for the proposed anesthesia with the patient or authorized representative who has indicated his/her  understanding and acceptance.   Dental advisory given  Plan Discussed with: CRNA and Anesthesiologist  Anesthesia Plan Comments:         Anesthesia Quick Evaluation  Anesthesia Physical Anesthesia Plan  ASA: III  Anesthesia Plan: General   Post-op Pain Management:    Induction: Intravenous  PONV Risk Score and Plan:   Airway Management Planned: Mask  Additional Equipment:   Intra-op Plan:   Post-operative Plan:   Informed Consent: I have reviewed the patients History and Physical, chart, labs and discussed the procedure including the risks, benefits and alternatives for the proposed anesthesia with the patient or authorized representative who has indicated his/her understanding and acceptance.   Dental advisory given  Plan Discussed with: CRNA and Anesthesiologist  Anesthesia Plan Comments:         Anesthesia Quick Evaluation

## 2017-09-02 NOTE — Anesthesia Procedure Notes (Signed)
Procedure Name: General with mask airway Date/Time: 09/02/2017 1:41 PM Performed by: Rush Farmer E Pre-anesthesia Checklist: Patient identified, Emergency Drugs available, Suction available, Patient being monitored and Timeout performed Patient Re-evaluated:Patient Re-evaluated prior to induction Oxygen Delivery Method: Ambu bag Preoxygenation: Pre-oxygenation with 100% oxygen Induction Type: IV induction Ventilation: Mask ventilation without difficulty Placement Confirmation: breath sounds checked- equal and bilateral Dental Injury: Teeth and Oropharynx as per pre-operative assessment

## 2017-09-02 NOTE — Anesthesia Postprocedure Evaluation (Signed)
Anesthesia Post Note  Patient: [name] [name]  Procedure(s) Performed: CARDIOVERSION (N/A )     Patient location during evaluation: PACU Anesthesia Type: General Level of consciousness: awake and alert Pain management: pain level controlled Vital Signs Assessment: post-procedure vital signs reviewed and stable Respiratory status: spontaneous breathing, nonlabored ventilation and respiratory function stable Cardiovascular status: blood pressure returned to baseline and stable Postop Assessment: no apparent nausea or vomiting Anesthetic complications: no    Last Vitals:  Vitals:   09/02/17 1400 09/02/17 1406  BP: (!) 121/46 (!) 132/96  Pulse: 64 61  Resp: 16 19  Temp:    SpO2: 99% 94%    Last Pain:  Vitals:   09/02/17 1349  TempSrc: Oral                 Lynda Rainwater

## 2017-09-02 NOTE — Interval H&P Note (Signed)
History and Physical Interval Note:  09/02/2017 12:49 PM  [name] [name]  has presented today for surgery, with the diagnosis of afib  The various methods of treatment have been discussed with the patient and family. After consideration of risks, benefits and other options for treatment, the patient has consented to  Procedure(s): CARDIOVERSION (N/A) as a surgical intervention .  The patient's history has been reviewed, patient examined, no change in status, stable for surgery.  I have reviewed the patient's chart and labs.  Questions were answered to the patient's satisfaction.     Adamariz Gillott

## 2017-09-02 NOTE — H&P (View-Only) (Signed)
Cardiology Office Note    Date:  08/20/2017   ID:  [name] [name], DOB 1941/11/29, MRN [id number]  PCP:  Midge Minium, MD  Cardiologist:   Sanda Klein, MD   Chief Complaint  Patient presents with  . Follow-up    pt c/o SOB and fatigue, pt denied any chest pain    History of Present Illness:  [name] [name] is a 76 y.o. female with complaints of worsening dyspnea and cough.   These problems have been going on for about a year. She has been seeing Dr. Annamaria Boots for lung nodules and fibrosis. These findings seem to be relatively stable on 3 month CT. 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 [name] has a mildly positive ANA at 1:160, but felt not to have lupus after rheumatology consultation. She has a long-standing history of obstructive sleep apnea but uses CPAP. 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), her daughter has severe chronic respiratory failure due to homozygous (ZZ) status and is deteriorating, now on 10 L of oxygen.Mrs  She has evidence of atherosclerosis of the aorta. She does not have angina pectoris. Nuclear stress test in January 2018 was normal/low risk.  She has had episodes of brief paroxysmal atrial fibrillation the past, usually lasting a few minutes to a maximum of 6 hours. Interrogation of her pacemaker today shows that she has been an uninterrupted persistent atrial fibrillation for about 3 months. Detailed interrogation about her symptoms and functional ability does not appear to suggest that there has been any additional deterioration in her symptoms after the onset of persistent atrial fibrillation. She relates the onset of her fatigue and dyspnea to a year ago.  By pacemaker monitoring ventricular rate control is very good. This is also confirmed with the frequent vital signs submitted from pulmonary rehabilitation. At rest her heart rate is typically  in the 70s. With exercise it peaks at about 105-110  Otherwise pacemaker function is normal. Before she had virtually no ventricular pacing, after atrial fibrillation onset she has roughly 16% ventricular pacing due to the erratic R-R interval. Heart rate histogram distribution is  appropriate. Activity is relatively constant at 2.6 hours a day, this is substantially worse than the average 4 hours per day at her last office pacemaker check.. Estimated battery longevity is about 6.5 years.  Her echocardiogram in April 2015 showed evidence of diastolic dysfunction without elevated filling pressures. Similar findings on echo in September 2017 with EF 50-55% and grade 1 diastolic dysfunction without elevated filling pressures. She has not had clinically evident congestive heart failure in the past. She does not have systemic hypertension.in fact, her blood pressure has been running relatively low (she does take metoprolol twice daily for ventricular rate control).   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  She is compliant with anticoagulation and has not had any bleeding problems or new neurological complaints. She denies angina pectoris at rest or with exertion, syncope, palpitations, leg edema, orthopnea or PND.     Past Medical History:  Diagnosis Date  . Alpha-1-antitrypsin deficiency carrier ([place])   . Arthritis    "all over"  . Atrial fibrillation ([place])   . Complication of anesthesia    "I woke up during 2 different procedures" (10/04/2014)  . Frequent UTI   . GERD (gastroesophageal reflux disease)   .  Hypertension   . MVP (mitral valve prolapse)   . Obesity   . Presence of permanent cardiac pacemaker   . PVC's (premature ventricular contractions)   . 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 Stroke [facility])  01/2014   denies deficits on 10/04/2014  . Tachy-brady syndrome ([place]) 10/2014    Past Surgical History:  Procedure Laterality Date  . EXCISION VAGINAL CYST     benign nodules  . INSERT / REPLACE / REMOVE PACEMAKER  10/04/2014   Medtronic Advisa model [id number] serial number [id number]  . LOOP RECORDER EXPLANT N/A 10/04/2014   Procedure: LOOP RECORDER EXPLANT;  Surgeon: Sanda Klein, MD;  Location: [facility] CATH LAB;  Service: Cardiovascular;  Laterality: N/A;  . LOOP RECORDER IMPLANT N/A 04/19/2014   Procedure: LOOP RECORDER IMPLANT;  Surgeon: Sanda Klein, MD;  Location: [facility] CATH LAB;  Service: Cardiovascular;  Laterality: N/A;  . NM MYOCAR PERF WALL MOTION  12/18/2007   normal  . PERMANENT PACEMAKER INSERTION N/A 10/04/2014   Procedure: PERMANENT PACEMAKER INSERTION;  Surgeon: Sanda Klein, MD;  Location: [facility] CATH LAB;  Service: Cardiovascular;  Laterality: N/A;  . TUBAL LIGATION    . US ECHOCARDIOGRAPHY  05/15/2010   LA mildly dilated,mild mitral annular ca+, AOV mildly sclerotic, mild asymmetric LVH    Current Medications: Outpatient Medications Prior to Visit  Medication Sig Dispense Refill  . apixaban (ELIQUIS) 5 MG TABS tablet Take 1 tablet (5 mg total) by mouth 2 (two) times daily. 28 tablet 0  . Biotin 5000 MCG CAPS Take 5,000 mcg by mouth daily.     . Calcium Carbonate-Vit D-Min (CALCIUM 1200 PO) Take 2,400 mg by mouth daily.     . cholecalciferol (VITAMIN D) 1000 UNITS tablet Take 1,000 Units by mouth daily.     Marland Kitchen Co-Enzyme Q10 100 MG CAPS Take 100 mg by mouth daily.     . diazepam (VALIUM) 5 MG tablet Take 1 tablet (5 mg total) by mouth as needed for anxiety. 30 tablet 1  . ELIQUIS 5 MG TABS tablet TAKE ONE TABLET BY MOUTH TWICE DAILY 034 tablet 1  . folic acid (FOLVITE) 742 MCG tablet Take 400 mcg by mouth daily.    Marland Kitchen glucosamine-chondroitin 500-400 MG tablet Take 1 tablet by mouth daily.     Marland Kitchen loratadine (KLS ALLERCLEAR) 10 MG tablet Take 10 mg by mouth daily.    Marland Kitchen MAGNESIUM  CITRATE PO Take 250 mg by mouth daily. 1 tab daily    . pantoprazole (PROTONIX) 40 MG tablet TAKE 1 TABLET BY MOUTH DAILY 60 tablet 1  . Resveratrol 250 MG CAPS Take 250 mg by mouth daily.    . sertraline (ZOLOFT) 100 MG tablet Take 1 tablet (100 mg total) by mouth daily. 90 tablet 0  . Simethicone (GAS-X PO) Take 2 tablets by mouth daily as needed (bloating).    . metoprolol tartrate (LOPRESSOR) 25 MG tablet Take 1 tablet (25 mg total) by mouth 2 (two) times daily. 180 tablet 2  . BREO ELLIPTA 100-25 MCG/INH AEPB Take 1 puff by mouth daily.     No facility-administered medications prior to visit.      Allergies:   Ancef [cefazolin]; Pseudoephedrine hcl; and Caffeine   Social History   Social History  . Marital status: Married    Spouse name: N/A  . Number of children: 3  . Years of education: college   Social History Main Topics  . Smoking status: Former Smoker  Packs/day: 2.00    Years: 18.00    Types: Cigarettes  . Smokeless tobacco: Never Used     Comment: "quit smoking in 1971"  . Alcohol use 0.0 oz/week     Comment: 10/04/2014 "might have a drink a couple times/yr"  . Drug use: No  . Sexual activity: No   Other Topics Concern  . Not on file   Social History Narrative   Patient lives alone in one story house.   Patient is right handed.   Patient has a college degree.   Patient is retired.     Family History:  The patient's family history includes Heart attack in her mother; Heart failure in her brother and father; Stroke in her brother and father.   ROS:   Please see the history of present illness.    ROS All other systems reviewed and are negative.   PHYSICAL EXAM:   VS:  BP 102/70   Pulse 77   Ht _0  (1.676 m)   Wt 273 lb 6.4 oz (124 kg)   BMI 44.13 kg/m    GEN: Well nourished, well developed, in no acute distress  HEENT: normal  Neck: no JVD, carotid bruits, or masses Cardiac: RRR; no murmurs, rubs, or gallops,no edema ,  LC left subclavian  pacemaker site Respiratory:  clear to auscultation bilaterally, normal work of breathing GI: soft, nontender, nondistended, + BS MS: no deformity or atrophy  Skin: warm and dry, no rash Neuro:  Alert and Oriented x 3, Strength and sensation are intact Psych: euthymic mood, full affect  Wt Readings from Last 3 Encounters:  08/20/17 273 lb 6.4 oz (124 kg)  08/19/17 273 lb 5.9 oz (124 kg)  08/14/17 273 lb 2.4 oz (123.9 kg)      Studies/Labs Reviewed:   EKG:  EKG is ordered today.  The ekg ordered today demonstrates atrial fibrillation, ventricular pacing occurs roughly 50% of the time on 12-lead tracing, QTC 402 ms  Recent Labs: 12/10/2016: Pro B Natriuretic peptide (BNP) 99.0 01/15/2017: ALT 15; BUN 14; Creatinine, Ser 0.97; Potassium 4.5; Sodium 139 02/24/2017: Hemoglobin 14.6; Platelets 251.0   Lipid Panel    Component Value Date/Time   CHOL 182 01/15/2017 1417   TRIG 168.0 (H) 01/15/2017 1417   HDL 54.60 01/15/2017 1417   CHOLHDL 3 01/15/2017 1417   VLDL 33.6 01/15/2017 1417   LDLCALC 94 01/15/2017 1417   LDLDIRECT 81.0 07/15/2016 1419    ASSESSMENT:    1. PAF (paroxysmal atrial fibrillation) ([place])   2. Tachy-brady syndrome ([place])   3. Pacemaker   4. Dyspnea on exertion   5. Long term current use of anticoagulant   6. Morbid obesity ([place])   7. OSA on CPAP      PLAN:  In order of problems listed above:  1. AFib: this is her first episode of persistent atrial fibrillation and it has been going on for about 3 months. As far as I can tell it's really not responsible for any of her symptoms. It seems to be the consequence of respiratory problems, rather than its cause. Rate control is adequate and there is probably room to reduce the dose of beta blocker. We'll cut back metoprolol to 12.5 mg twice daily. CHADSVasc 5 (Age 57, gender, TIA). Rate control during the longer episodes of atrial fibrillation is excellent.I would recommend a 1 time shot at cardioversion. If there is  substantial improvement in symptoms when she is in normal rhythm, that would justify further aggressive  antiarrhythmic pursuits (whether with medications or ablation). However, I anticipate there'll be little symptom improvement when we return to normal rhythm. This procedure has been fully reviewed with the patient and written informed consent has been obtained. 2. SSS: prior to onset of atrial fibrillation she had a very high prevalence of atrial pacing with satisfactory heart rate histogram distribution with the current pacemaker settings. 3. PPM: Normal device function, remote downloads every 3 months and yearly office visits 4. Dyspnea: unclear to what degree the abnormal radiological findings are responsible for her symptoms. The PFTs were really not that bad. I also don't think that her dyspnea is related to heart failure. Ultimately could consider right and left heart catheterization to clarify this. Will discuss with Dr. Annamaria Boots. I had always assumed that a lot of her complaints were related to obesity and deconditioning, but there does seem to be a significant downturn over the last 1 year. 5. Anticoagulation: no bleeding complications on Eliquis 6. Obesity: Strongly recommend weight loss which will help with her dyspnea and reduce the burden of arrhythmia. 7. OSA reports compliance with CPAP    Medication Adjustments/Labs and Tests Ordered: Current medicines are reviewed at length with the patient today.  Concerns regarding medicines are outlined above.  Medication changes, Labs and Tests ordered today are listed in the Patient Instructions below. Patient Instructions  Dr Sallyanne Kuster has recommended making the following medication changes: 1. DECREASE Metoprolol to 12.5 mg (0.5 tablet) twice daily  Your physician has recommended that you have a Cardioversion (DCCV). This has been scheduled for Tuesday, October 2nd, 2018 with Dr Sallyanne Kuster. Please arrive to the hospital no later than 12:30p.  Electrical Cardioversion uses a jolt of electricity to your heart either through paddles or wired patches attached to your chest. This is a controlled, usually prescheduled, procedure. Defibrillation is done under light anesthesia in the hospital, and you usually go home the day of the procedure. This is done to get your heart back into a normal rhythm. You are not awake for the procedure. Please see the instruction sheet given to you today.    Signed, Sanda Klein, MD  08/20/2017 4:33 PM    [address], [place],   [address] Phone: [phone]; Fax: [phone]

## 2017-09-02 NOTE — Op Note (Signed)
Procedure: Electrical Cardioversion Indications:  Atrial Fibrillation  Procedure Details:  Consent: Risks of procedure as well as the alternatives and risks of each were explained to the (patient/caregiver).  Consent for procedure obtained.  Time Out: Verified patient identification, verified procedure, site/side was marked, verified correct patient position, special equipment/implants available, medications/allergies/relevent history reviewed, required imaging and test results available.  Performed  Patient placed on cardiac monitor, pulse oximetry, supplemental oxygen as necessary.  Sedation given: propofol 60 mg IV. Dr. Sabra Heck Pacer pads placed anterior and posterior chest.  Cardioverted 2 time(s).  Initial 120J shock was unsuccessful. Cardioversion with synchronized biphasic 200J shock.  Evaluation: Findings: Post procedure EKG shows: A paced V sensed rhythm with PACs Complications: None Patient did tolerate procedure well.  Time Spent Directly with the Patient:  30 minutes   [name] [name] 09/02/2017, 1:51 PM

## 2017-09-02 NOTE — Transfer of Care (Signed)
Immediate Anesthesia Transfer of Care Note  Patient: [name] [name]  Procedure(s) Performed: CARDIOVERSION (N/A )  Patient Location: Endoscopy Unit  Anesthesia Type:General  Level of Consciousness: awake, alert  and oriented  Airway & Oxygen Therapy: Patient Spontanous Breathing and Patient connected to nasal cannula oxygen  Post-op Assessment: Report given to RN, Post -op Vital signs reviewed and stable and Patient moving all extremities X 4  Post vital signs: Reviewed and stable  Last Vitals:  Vitals:   09/02/17 1304 09/02/17 1349  BP: (!) 133/55 (!) 117/52  Pulse: 75 63  Resp: 15 (!) 26  Temp: 36.8 C 36.7 C  SpO2: 98% 98%    Last Pain:  Vitals:   09/02/17 1349  TempSrc: Oral         Complications: No apparent anesthesia complications

## 2017-09-03 ENCOUNTER — Encounter: Payer: Self-pay | Admitting: Cardiology

## 2017-09-03 ENCOUNTER — Encounter (HOSPITAL_COMMUNITY): Payer: Self-pay | Admitting: Cardiovascular Disease

## 2017-09-04 ENCOUNTER — Encounter (HOSPITAL_COMMUNITY): Payer: Medicare Other

## 2017-09-04 DIAGNOSIS — Z23 Encounter for immunization: Secondary | ICD-10-CM | POA: Diagnosis not present

## 2017-09-04 LAB — CUP PACEART REMOTE DEVICE CHECK
Battery Remaining Longevity: 78 mo
Battery Voltage: 3.01 V
Brady Statistic RA Percent Paced: 0 %
Date Time Interrogation Session: 20181002132842
Implantable Lead Implant Date: 20151103
Implantable Lead Location: 753859
Implantable Lead Location: 753860
Implantable Lead Model: 5076
Lead Channel Pacing Threshold Pulse Width: 0.4 ms
Lead Channel Sensing Intrinsic Amplitude: 2.625 mV
Lead Channel Sensing Intrinsic Amplitude: 2.625 mV
Lead Channel Setting Pacing Pulse Width: 0.4 ms
MDC IDC LEAD IMPLANT DT: 20151103
MDC IDC MSMT LEADCHNL RA IMPEDANCE VALUE: 380 Ohm
MDC IDC MSMT LEADCHNL RA IMPEDANCE VALUE: 475 Ohm
MDC IDC MSMT LEADCHNL RA PACING THRESHOLD AMPLITUDE: 0.5 V
MDC IDC MSMT LEADCHNL RV IMPEDANCE VALUE: 494 Ohm
MDC IDC MSMT LEADCHNL RV IMPEDANCE VALUE: 513 Ohm
MDC IDC MSMT LEADCHNL RV PACING THRESHOLD AMPLITUDE: 0.5 V
MDC IDC MSMT LEADCHNL RV PACING THRESHOLD PULSEWIDTH: 0.4 ms
MDC IDC MSMT LEADCHNL RV SENSING INTR AMPL: 7.75 mV
MDC IDC MSMT LEADCHNL RV SENSING INTR AMPL: 7.75 mV
MDC IDC PG IMPLANT DT: 20151103
MDC IDC SET LEADCHNL RA PACING AMPLITUDE: 1.5 V
MDC IDC SET LEADCHNL RV PACING AMPLITUDE: 2 V
MDC IDC SET LEADCHNL RV SENSING SENSITIVITY: 2.8 mV
MDC IDC STAT BRADY AP VP PERCENT: 0 %
MDC IDC STAT BRADY AP VS PERCENT: 0 %
MDC IDC STAT BRADY AS VP PERCENT: 24.51 %
MDC IDC STAT BRADY AS VS PERCENT: 75.49 %
MDC IDC STAT BRADY RV PERCENT PACED: 25.62 %

## 2017-09-04 NOTE — Telephone Encounter (Signed)
Spoke with pt and informed her that Dr. Loletha Grayer would like for her to send a transmission next week to verify that she is maintaining NSR~ pt agree to send transmission on 09/10/2017

## 2017-09-04 NOTE — Telephone Encounter (Signed)
-----  Message from Sanda Klein, MD sent at 09/04/2017  2:08 PM EDT ----- Remote reviewed.   Not pacemaker dependent. Battery status is good. Lead measurements are stable. Heart rate histogram is favorable. Persistent atrial fibrillation for last 3 months.  Had cardioversion on 09-02-2017. Please ask her to download next week to see if she is maintaining normal rhythm and call to see if symptoms are better after the DCCV.

## 2017-09-09 ENCOUNTER — Encounter (HOSPITAL_COMMUNITY)
Admission: RE | Admit: 2017-09-09 | Discharge: 2017-09-09 | Disposition: A | Discharge status: Home / Self Care | Payer: Medicare Other | Source: Ambulatory Visit | Attending: Internal Medicine | Admitting: Internal Medicine

## 2017-09-09 VITALS — Wt 272.9 lb

## 2017-09-09 DIAGNOSIS — Z6841 Body Mass Index (BMI) 40.0 and over, adult: Secondary | ICD-10-CM | POA: Diagnosis not present

## 2017-09-09 DIAGNOSIS — Z8673 Personal history of transient ischemic attack (TIA), and cerebral infarction without residual deficits: Secondary | ICD-10-CM | POA: Insufficient documentation

## 2017-09-09 DIAGNOSIS — M1990 Unspecified osteoarthritis, unspecified site: Secondary | ICD-10-CM | POA: Insufficient documentation

## 2017-09-09 DIAGNOSIS — I341 Nonrheumatic mitral (valve) prolapse: Secondary | ICD-10-CM | POA: Insufficient documentation

## 2017-09-09 DIAGNOSIS — I495 Sick sinus syndrome: Secondary | ICD-10-CM | POA: Insufficient documentation

## 2017-09-09 DIAGNOSIS — Z7951 Long term (current) use of inhaled steroids: Secondary | ICD-10-CM | POA: Diagnosis not present

## 2017-09-09 DIAGNOSIS — Z950 Presence of cardiac pacemaker: Secondary | ICD-10-CM | POA: Diagnosis not present

## 2017-09-09 DIAGNOSIS — Z7901 Long term (current) use of anticoagulants: Secondary | ICD-10-CM | POA: Insufficient documentation

## 2017-09-09 DIAGNOSIS — Z87891 Personal history of nicotine dependence: Secondary | ICD-10-CM | POA: Diagnosis not present

## 2017-09-09 DIAGNOSIS — K219 Gastro-esophageal reflux disease without esophagitis: Secondary | ICD-10-CM | POA: Insufficient documentation

## 2017-09-09 DIAGNOSIS — I10 Essential (primary) hypertension: Secondary | ICD-10-CM | POA: Insufficient documentation

## 2017-09-09 DIAGNOSIS — R0609 Other forms of dyspnea: Secondary | ICD-10-CM | POA: Insufficient documentation

## 2017-09-09 DIAGNOSIS — Z79899 Other long term (current) drug therapy: Secondary | ICD-10-CM | POA: Diagnosis not present

## 2017-09-09 DIAGNOSIS — E669 Obesity, unspecified: Secondary | ICD-10-CM | POA: Diagnosis not present

## 2017-09-09 DIAGNOSIS — I4891 Unspecified atrial fibrillation: Secondary | ICD-10-CM | POA: Insufficient documentation

## 2017-09-09 NOTE — Progress Notes (Signed)
Daily Session Note  Patient Details  Name: [name] [name] MRN: [id number] Date of Birth: 11/25/1941 Referring Provider:     Pulmonary Rehab Walk Test from 06/17/2017 in [facility]  Referring Provider  Dr. Young      Encounter Date: 09/09/2017  Check In:     Session Check In - 09/09/17 1354      Check-In   Location [facility]   Staff Present Molly diVincenzo, MS, ACSM RCEP, Exercise Physiologist;Portia Payne, RN, BSN   Supervising physician immediately available to respond to emergencies [facility] immediately available   Physician(s) Dr. Matthews   Medication changes reported     No   Fall or balance concerns reported    No   Tobacco Cessation No Change   Warm-up and Cool-down Performed as group-led instruction   Resistance Training Performed Yes   VAD Patient? No     Pain Assessment   Currently in Pain? No/denies   Multiple Pain Sites No      Capillary Blood Glucose: No results found for this or any previous visit (from the past 24 hour(s)).      Exercise Prescription Changes - 09/09/17 1500      Response to Exercise   Blood Pressure (Admit) 96/64   Blood Pressure (Exercise) 110/66   Blood Pressure (Exit) 104/60   Heart Rate (Admit) 64 bpm   Heart Rate (Exercise) 94 bpm   Heart Rate (Exit) 87 bpm   Oxygen Saturation (Admit) 96 %   Oxygen Saturation (Exercise) 93 %   Oxygen Saturation (Exit) 93 %   Rating of Perceived Exertion (Exercise) 11   Perceived Dyspnea (Exercise) 3   Duration Progress to 45 minutes of aerobic exercise without signs/symptoms of physical distress   Intensity THRR unchanged     Progression   Progression Continue to progress workloads to maintain intensity without signs/symptoms of physical distress.     Resistance Training   Training Prescription Yes   Weight orange bands   Reps 10-15   Time 10 Minutes     NuStep   Level 6   Minutes 17   METs 2.2     Track   Laps 12   Minutes 17      History  Smoking Status  . Former Smoker  . Packs/day: 2.00  . Years: 18.00  . Types: Cigarettes  Smokeless Tobacco  . Never Used    Comment: "quit smoking in 1971"    Goals Met:  Exercise tolerated well No report of cardiac concerns or symptoms Strength training completed today  Goals Unmet:  Not Applicable  Comments: Service time is from 1330 to 1520    Dr. Wesam G. Yacoub is Medical Director for Pulmonary Rehab at  Hospital. 

## 2017-09-10 ENCOUNTER — Ambulatory Visit (INDEPENDENT_AMBULATORY_CARE_PROVIDER_SITE_OTHER): Payer: Self-pay | Admitting: *Deleted

## 2017-09-10 DIAGNOSIS — I495 Sick sinus syndrome: Secondary | ICD-10-CM

## 2017-09-10 LAB — CUP PACEART REMOTE DEVICE CHECK
Brady Statistic AP VP Percent: 0.07 %
Brady Statistic AS VP Percent: 0.01 %
Brady Statistic AS VS Percent: 12.06 %
Date Time Interrogation Session: 20181010124728
Implantable Lead Implant Date: 20151103
Implantable Lead Location: 753860
Implantable Lead Model: 5076
Lead Channel Impedance Value: 418 Ohm
Lead Channel Impedance Value: 475 Ohm
Lead Channel Pacing Threshold Amplitude: 0.625 V
Lead Channel Pacing Threshold Pulse Width: 0.4 ms
Lead Channel Pacing Threshold Pulse Width: 0.4 ms
Lead Channel Setting Pacing Amplitude: 1.5 V
Lead Channel Setting Pacing Pulse Width: 0.4 ms
MDC IDC LEAD IMPLANT DT: 20151103
MDC IDC LEAD LOCATION: 753859
MDC IDC MSMT BATTERY REMAINING LONGEVITY: 78 mo
MDC IDC MSMT BATTERY VOLTAGE: 3.02 V
MDC IDC MSMT LEADCHNL RA IMPEDANCE VALUE: 475 Ohm
MDC IDC MSMT LEADCHNL RA SENSING INTR AMPL: 5.5 mV
MDC IDC MSMT LEADCHNL RA SENSING INTR AMPL: 5.5 mV
MDC IDC MSMT LEADCHNL RV IMPEDANCE VALUE: 513 Ohm
MDC IDC MSMT LEADCHNL RV PACING THRESHOLD AMPLITUDE: 0.5 V
MDC IDC MSMT LEADCHNL RV SENSING INTR AMPL: 6.25 mV
MDC IDC MSMT LEADCHNL RV SENSING INTR AMPL: 6.25 mV
MDC IDC PG IMPLANT DT: 20151103
MDC IDC SET LEADCHNL RV PACING AMPLITUDE: 2 V
MDC IDC SET LEADCHNL RV SENSING SENSITIVITY: 2.8 mV
MDC IDC STAT BRADY AP VS PERCENT: 87.85 %
MDC IDC STAT BRADY RA PERCENT PACED: 87.65 %
MDC IDC STAT BRADY RV PERCENT PACED: 0.09 %

## 2017-09-10 NOTE — Progress Notes (Signed)
Remote pacemaker transmission.   

## 2017-09-11 ENCOUNTER — Encounter (HOSPITAL_COMMUNITY): Payer: Medicare Other

## 2017-09-11 ENCOUNTER — Telehealth: Payer: Self-pay | Admitting: *Deleted

## 2017-09-11 NOTE — Telephone Encounter (Signed)
Remote transmission from 09/10/17 received. Presenting rhythm: ApVs. No episodes since DCCV. Stable device function.  Called patient about sx's. Patient states that she has noticed an improvement in her sx's since the DCCV. She said that she's able to be more active and her breathing is better. She said overall she thinks she's improved by at least 30%. Will forward information to Dr.Croitoru.

## 2017-09-11 NOTE — Telephone Encounter (Signed)
-----  Message from Sanda Klein, MD sent at 09/04/2017  2:08 PM EDT ----- Remote reviewed.   Not pacemaker dependent. Battery status is good. Lead measurements are stable. Heart rate histogram is favorable. Persistent atrial fibrillation for last 3 months.  Had cardioversion on 09-02-2017. Please ask her to download next week to see if she is maintaining normal rhythm and call to see if symptoms are better after the DCCV.

## 2017-09-12 ENCOUNTER — Encounter: Payer: Self-pay | Admitting: Cardiology

## 2017-09-16 ENCOUNTER — Encounter (HOSPITAL_COMMUNITY)
Admission: RE | Admit: 2017-09-16 | Discharge: 2017-09-16 | Disposition: A | Discharge status: Home / Self Care | Payer: Medicare Other | Source: Ambulatory Visit | Attending: Internal Medicine | Admitting: Internal Medicine

## 2017-09-16 VITALS — Wt 274.3 lb

## 2017-09-16 DIAGNOSIS — Z7901 Long term (current) use of anticoagulants: Secondary | ICD-10-CM | POA: Diagnosis not present

## 2017-09-16 DIAGNOSIS — Z7951 Long term (current) use of inhaled steroids: Secondary | ICD-10-CM | POA: Diagnosis not present

## 2017-09-16 DIAGNOSIS — M1990 Unspecified osteoarthritis, unspecified site: Secondary | ICD-10-CM | POA: Diagnosis not present

## 2017-09-16 DIAGNOSIS — R0609 Other forms of dyspnea: Principal | ICD-10-CM

## 2017-09-16 DIAGNOSIS — Z79899 Other long term (current) drug therapy: Secondary | ICD-10-CM | POA: Diagnosis not present

## 2017-09-16 DIAGNOSIS — Z950 Presence of cardiac pacemaker: Secondary | ICD-10-CM | POA: Diagnosis not present

## 2017-09-16 NOTE — Progress Notes (Signed)
Daily Session Note  Patient Details  Name: [name] [name] MRN: [id number] Date of Birth: Mar 04, 1941 Referring Provider:     Pulmonary Rehab Walk Test from 06/17/2017 in [facility]  Referring Provider  Dr. Annamaria Boots      Encounter Date: 09/16/2017  Check In:     Session Check In - 09/16/17 1333      Check-In   Location [facility]   Staff Present Su Hilt, MS, ACSM RCEP, Exercise Physiologist;Lisa Ysidro Evert, RN;Vihan Santagata Rollene Rotunda, RN, BSN   Supervising physician immediately available to respond to emergencies [facility] immediately available   Physician(s) Dr. Verlon Au   Medication changes reported     No   Fall or balance concerns reported    No   Tobacco Cessation No Change   Warm-up and Cool-down Performed as group-led instruction   Resistance Training Performed Yes   VAD Patient? No     Pain Assessment   Currently in Pain? No/denies   Multiple Pain Sites No      Capillary Blood Glucose: No results found for this or any previous visit (from the past 24 hour(s)).      Exercise Prescription Changes - 09/16/17 1529      Response to Exercise   Blood Pressure (Admit) 144/64   Blood Pressure (Exercise) 120/74   Blood Pressure (Exit) 126/60   Heart Rate (Admit) 66 bpm   Heart Rate (Exercise) 88 bpm   Heart Rate (Exit) 61 bpm   Oxygen Saturation (Admit) 95 %   Oxygen Saturation (Exercise) 94 %   Oxygen Saturation (Exit) 91 %   Rating of Perceived Exertion (Exercise) 13   Perceived Dyspnea (Exercise) 2   Duration Progress to 45 minutes of aerobic exercise without signs/symptoms of physical distress   Intensity THRR unchanged     Progression   Progression Continue to progress workloads to maintain intensity without signs/symptoms of physical distress.     Resistance Training   Training Prescription Yes   Weight orange bands   Reps 10-15   Time 10 Minutes     NuStep   Level 6   Minutes 34   METs 2.2     Track   Laps 12   Minutes 17      History  Smoking Status  . Former Smoker  . Packs/day: 2.00  . Years: 18.00  . Types: Cigarettes  Smokeless Tobacco  . Never Used    Comment: "quit smoking in 1971"    Goals Met:  Improved SOB with ADL's Using PLB without cueing & demonstrates good technique Exercise tolerated well No report of cardiac concerns or symptoms Strength training completed today  Goals Unmet:  Not Applicable  Comments: Service time is from 1330 to 1505   Dr. Rush Farmer is Medical Director for Pulmonary Rehab at [facility].

## 2017-09-17 ENCOUNTER — Ambulatory Visit (INDEPENDENT_AMBULATORY_CARE_PROVIDER_SITE_OTHER): Payer: Medicare Other | Admitting: Family Medicine

## 2017-09-17 ENCOUNTER — Encounter (INDEPENDENT_AMBULATORY_CARE_PROVIDER_SITE_OTHER): Payer: Self-pay | Admitting: Family Medicine

## 2017-09-17 VITALS — BP 91/58 | HR 70 | Temp 98.0°F | Ht 67.0 in | Wt 267.0 lb

## 2017-09-17 DIAGNOSIS — I4891 Unspecified atrial fibrillation: Secondary | ICD-10-CM | POA: Diagnosis not present

## 2017-09-17 DIAGNOSIS — Z6841 Body Mass Index (BMI) 40.0 and over, adult: Secondary | ICD-10-CM

## 2017-09-17 DIAGNOSIS — R739 Hyperglycemia, unspecified: Secondary | ICD-10-CM

## 2017-09-17 DIAGNOSIS — Z1331 Encounter for screening for depression: Secondary | ICD-10-CM

## 2017-09-17 DIAGNOSIS — E559 Vitamin D deficiency, unspecified: Secondary | ICD-10-CM | POA: Diagnosis not present

## 2017-09-17 DIAGNOSIS — R5383 Other fatigue: Secondary | ICD-10-CM

## 2017-09-17 DIAGNOSIS — Z0289 Encounter for other administrative examinations: Secondary | ICD-10-CM

## 2017-09-17 DIAGNOSIS — R0602 Shortness of breath: Secondary | ICD-10-CM | POA: Diagnosis not present

## 2017-09-17 DIAGNOSIS — E781 Pure hyperglyceridemia: Secondary | ICD-10-CM | POA: Diagnosis not present

## 2017-09-17 NOTE — Progress Notes (Signed)
.  Office: [phone]  /  Fax: [phone]   HPI:   Chief Complaint: OBESITY  [name] [name] (MR# [id number]) is a 76 y.o. female who presents on 09/17/2017 for obesity evaluation and treatment. Current BMI is Body mass index is 41.82 kg/m.Marland Kitchen [name] [name] has struggled with obesity for years and has been unsuccessful in either losing weight or maintaining long term weight loss. [name] [name] attended our information session and states she is currently in the action stage of change and ready to dedicate time achieving and maintaining a healthier weight.  [name] [name] states she struggles with family and or coworkers weight loss sabotage her desired weight loss is 89 lbs she started gaining weight 2 1/2 yrs ago her heaviest weight ever was 274 lbs. she is a picky eater and doesn't like to eat healthier foods  she has significant food cravings issues  she snacks frequently in the evenings she frequently makes poor food choices she frequently eats larger portions than normal  she has binge eating behaviors she struggles with emotional eating    Fatigue [name] [name] feels her energy is lower than it should be. This has worsened with weight gain and has not worsened recently. [name] [name] admits to daytime somnolence and  admits to waking up still tired. Patient is at risk for obstructive sleep apnea. Patent has a history of symptoms of daytime fatigue. Patient generally gets 7 hours of sleep per night, and states they generally have restful sleep. Snoring is present. Apneic episodes are not present. Epworth Sleepiness Score is 16  Atrial Fibrillation [name] [name] has a positive history of TIA and is on Eliquis and metoprolol. She is status post pacemaker and [name] [name] denies chest pain.  Vitamin D deficiency [name] [name] has a diagnosis of vitamin D deficiency. She is currently taking OTC vit D and admits fatigue but denies nausea, vomiting or muscle weakness.  Hyperglycemia [name] [name] has had fasting glucose levels > 99 in the past and  she has a positive family history of diabetes. She denies polyphagia.  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 She is not on oxygen and has some lung scarring, likely her weight is also affecting her breathing and exercise tolerance. She notes increasing shortness of breath with activity and seems to be worsening over time with weight gain. She notes getting out of breath sooner with activity than she used to. This has not gotten worse recently. [name] [name] denies shortness of breath at rest or orthopnea.  Hypertriglyceridemia [name] [name] has hypertriglyceridemia and is not currently on statin. She is attempting to improve her cholesterol levels with intensive lifestyle modification including a low saturated fat diet, exercise and weight loss. She denies any chest pain, claudication or myalgias.  Depression Screen [name] [name]'s Food and Mood (modified PHQ-9) score was  Depression screen PHQ 2/9 09/17/2017  Decreased Interest 2  Down, Depressed, Hopeless 2  PHQ - 2 Score 4  Altered sleeping 0  Tired, decreased energy 3  Change in appetite 3  Feeling bad or failure about yourself  2  Trouble concentrating 3  Moving slowly or fidgety/restless 0  Suicidal thoughts 0  PHQ-9 Score 15  Difficult doing work/chores Very difficult    ALLERGIES: Allergies  Allergen Reactions  . Ancef [Cefazolin] Hives and Itching  . Pseudoephedrine Hcl     Other reaction(s): palpitations (moderate)  . Caffeine Palpitations    MEDICATIONS: Current Outpatient Prescriptions on File Prior to Visit  Medication Sig Dispense Refill  . apixaban (ELIQUIS) 5 MG TABS tablet Take  1 tablet (5 mg total) by mouth 2 (two) times daily. 28 tablet 0  . Biotin 5000 MCG CAPS Take 5,000 mcg by mouth daily.     . Calcium Carbonate-Vit D-Min (CALCIUM 1200 PO) Take 2,400 mg by mouth daily.     . cholecalciferol (VITAMIN D) 1000 UNITS tablet Take 1,000 Units by mouth daily.     Marland Kitchen Co-Enzyme Q10 100 MG CAPS  Take 100 mg by mouth daily.     . diazepam (VALIUM) 5 MG tablet TAKE 1 TABLET BY MOUTH ONCE DAILY AS NEEDED FOR ANXIETY 30 tablet 1  . folic acid (FOLVITE) 277 MCG tablet Take 400 mcg by mouth daily.    Marland Kitchen glucosamine-chondroitin 500-400 MG tablet Take 1 tablet by mouth daily.     Marland Kitchen loratadine (KLS ALLERCLEAR) 10 MG tablet Take 10 mg by mouth daily.    Marland Kitchen MAGNESIUM CITRATE PO Take 250 mg by mouth daily. 1 tab daily    . metoprolol tartrate (LOPRESSOR) 25 MG tablet Take 0.5 tablets (12.5 mg total) by mouth 2 (two) times daily. 90 tablet 3  . pantoprazole (PROTONIX) 40 MG tablet TAKE 1 TABLET BY MOUTH DAILY 60 tablet 1  . Resveratrol 250 MG CAPS Take 250 mg by mouth daily.    . sertraline (ZOLOFT) 100 MG tablet Take 1 tablet (100 mg total) by mouth daily. 90 tablet 0  . Simethicone (GAS-X PO) Take 2 tablets by mouth daily as needed (bloating).     No current facility-administered medications on file prior to visit.     PAST MEDICAL HISTORY: Past Medical History:  Diagnosis Date  . Alpha-1-antitrypsin deficiency carrier ([place])   . Arthritis    "all over"  . Atrial fibrillation ([place])   . Back pain   . Complication of anesthesia    "I woke up during 2 different procedures" (10/04/2014)  . Frequent UTI   . GERD (gastroesophageal reflux disease)   . Hypertension   . MVP (mitral valve prolapse)   . Obesity   . Osteoarthritis   . Presence of permanent cardiac pacemaker   . PVC's (premature ventricular contractions)   . Shortness of breath   . 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 Sleep apnea   . Stroke [facility]) 01/2014   denies deficits on 10/04/2014  . Tachy-brady syndrome ([place]) 10/2014  . TIA (transient ischemic attack)     PAST SURGICAL HISTORY: Past Surgical History:  Procedure Laterality Date  . CARDIOVERSION N/A 09/02/2017   Procedure: CARDIOVERSION;  Surgeon: Sanda Klein, MD;  Location: [facility] ENDOSCOPY;  Service: Cardiovascular;   Laterality: N/A;  . EXCISION VAGINAL CYST     benign nodules  . INSERT / REPLACE / REMOVE PACEMAKER  10/04/2014   Medtronic Advisa model [id number] serial number [id number]  . LOOP RECORDER EXPLANT N/A 10/04/2014   Procedure: LOOP RECORDER EXPLANT;  Surgeon: Sanda Klein, MD;  Location: [place] CATH LAB;  Service: Cardiovascular;  Laterality: N/A;  . LOOP RECORDER IMPLANT N/A 04/19/2014   Procedure: LOOP RECORDER IMPLANT;  Surgeon: Sanda Klein, MD;  Location: [place] CATH LAB;  Service: Cardiovascular;  Laterality: N/A;  . NM MYOCAR PERF WALL MOTION  12/18/2007   normal  . PERMANENT PACEMAKER INSERTION N/A 10/04/2014   Procedure: PERMANENT PACEMAKER INSERTION;  Surgeon: Sanda Klein, MD;  Location: [place] CATH LAB;  Service: Cardiovascular;  Laterality: N/A;  . TUBAL LIGATION    . US ECHOCARDIOGRAPHY  05/15/2010   LA mildly dilated,mild mitral annular  ca+, AOV mildly sclerotic, mild asymmetric LVH    SOCIAL HISTORY: Social History  Substance Use Topics  . Smoking status: Former Smoker    Packs/day: 2.00    Years: 18.00    Types: Cigarettes  . Smokeless tobacco: Never Used     Comment: "quit smoking in 1971"  . Alcohol use 0.0 oz/week     Comment: 10/04/2014 "might have a drink a couple times/yr"    FAMILY HISTORY: Family History  Problem Relation Age of Onset  . Heart attack Mother   . Sudden death Mother   . Depression Mother   . Stroke Father   . Heart failure Father   . Depression Father   . Heart failure Brother   . Stroke Brother     ROS: Review of Systems  Constitutional: Positive for malaise/fatigue.  HENT: Positive for hearing loss.        Dentures Dry Mouth  Eyes:       Vision changes Wear Glasses or Contacts  Respiratory: Positive for cough and shortness of breath.        Painful or Difficulty Breathing  Cardiovascular: Positive for palpitations. Negative for chest pain, orthopnea and claudication.       Shortness of Breath with Activity  Gastrointestinal: Positive  for heartburn. Negative for nausea and vomiting.  Musculoskeletal: Positive for back pain and neck pain. Negative for myalgias.       Neck Stiffness Muscle Stiffness Muscle or Joint Pain Negative muscle weakness  Skin: Positive for itching.       Dryness   Endo/Heme/Allergies:       Negative polyphagia  Psychiatric/Behavioral: Positive for depression.    PHYSICAL EXAM: Blood pressure (!) 91/58, pulse 70, temperature 98 F (36.7 C), temperature source Oral, height 5' 7" (1.702 m), weight 267 lb (121.1 kg), SpO2 97 %. Body mass index is 41.82 kg/m. Physical Exam  Constitutional: She is oriented to person, place, and time. She appears well-developed and well-nourished.  Cardiovascular: Normal rate.   Pulmonary/Chest: Effort normal.  Musculoskeletal: Normal range of motion.  Neurological: She is oriented to person, place, and time.  Skin: Skin is warm and dry.  Psychiatric: She has a normal mood and affect. Her behavior is normal.  Vitals reviewed.   RECENT LABS AND TESTS: BMET    Component Value Date/Time   NA 140 08/27/2017 1509   K 4.9 08/27/2017 1509   CL 103 08/27/2017 1509   CO2 25 08/27/2017 1509   GLUCOSE 80 08/27/2017 1509   GLUCOSE 92 01/15/2017 1417   BUN 16 08/27/2017 1509   CREATININE 1.03 (H) 08/27/2017 1509   CREATININE 0.86 09/28/2014 1557   CALCIUM 9.2 08/27/2017 1509   GFRNONAA 53 (L) 08/27/2017 1509   GFRNONAA 56 (L) 06/06/2014 1653   GFRAA 61 08/27/2017 1509   GFRAA 65 06/06/2014 1653   Lab Results  Component Value Date   HGBA1C 5.9 (H) 03/09/2014   No results found for: INSULIN CBC    Component Value Date/Time   WBC 9.2 08/27/2017 1509   WBC 9.1 02/24/2017 1555   RBC 5.22 08/27/2017 1509   RBC 5.09 02/24/2017 1555   HGB 15.0 08/27/2017 1509   HCT 45.9 08/27/2017 1509   PLT 258 08/27/2017 1509   MCV 88 08/27/2017 1509   MCH 28.7 08/27/2017 1509   MCH 29.0 09/28/2014 1557   MCHC 32.7 08/27/2017 1509   MCHC 33.9 02/24/2017 1555   RDW  13.7 08/27/2017 1509   LYMPHSABS 2.8 02/24/2017 1555  MONOABS 1.3 (H) 02/24/2017 1555   EOSABS 0.3 02/24/2017 1555   BASOSABS 0.1 02/24/2017 1555   Iron/TIBC/Ferritin/ %Sat No results found for: IRON, TIBC, FERRITIN, IRONPCTSAT Lipid Panel     Component Value Date/Time   CHOL 182 01/15/2017 1417   TRIG 168.0 (H) 01/15/2017 1417   HDL 54.60 01/15/2017 1417   CHOLHDL 3 01/15/2017 1417   VLDL 33.6 01/15/2017 1417   LDLCALC 94 01/15/2017 1417   LDLDIRECT 81.0 07/15/2016 1419   Hepatic Function Panel     Component Value Date/Time   PROT 6.8 01/15/2017 1417   ALBUMIN 3.9 01/15/2017 1417   AST 22 01/15/2017 1417   ALT 15 01/15/2017 1417   ALKPHOS 72 01/15/2017 1417   BILITOT 0.3 01/15/2017 1417   BILIDIR 0.1 01/15/2017 1417      Component Value Date/Time   TSH 2.70 07/15/2016 1419   TSH 1.529 06/06/2014 1608    ECG  shows NSR with a rate of 77 BPM INDIRECT CALORIMETER done today shows a VO2 of 256 and a REE of 1779. Her calculated basal metabolic rate is 9833 thus her basal metabolic rate is worse than expected.    ASSESSMENT AND PLAN: Other fatigue - Plan: CBC With Differential, Lipid Panel With LDL/HDL Ratio, T3, T4, free, TSH, CANCELED: EKG 12-Lead  Shortness of breath on exertion  Atrial fibrillation, unspecified type (HCC)  Vitamin D deficiency - Plan: VITAMIN D 25 Hydroxy (Vit-D Deficiency, Fractures)  Hyperglycemia - Plan: Comprehensive metabolic panel, Hemoglobin A1c, Insulin, random  Depression screening  Class 3 severe obesity with serious comorbidity and body mass index (BMI) of 40.0 to 44.9 in adult, unspecified obesity type (HCC)  PLAN:  Fatigue [name] [name] was informed that her fatigue may be related to obesity, depression or many other causes. Labs will be ordered, and in the meanwhile [name] [name] has agreed to work on diet, exercise and weight loss to help with fatigue. Proper sleep hygiene was discussed including the need for 7-8 hours of quality sleep  each night. A sleep study was not ordered based on symptoms and Epworth score.  Atrial Fibrillation We will factor cardiac issues into exercise plan and will monitor closely. [name] [name] agrees to follow up with our clinic in 2 weeks.  Vitamin D Deficiency [name] [name] was informed that low vitamin D levels contributes to fatigue and are associated with obesity, breast, and colon cancer. She agrees to continue to take OTC vitamin D for now. We will check labs and will follow up for routine testing of vitamin D, at least 2-3 times per year. She was informed of the risk of over-replacement of vitamin D and agrees to not increase her dose unless he discusses this with [place] first. [name] [name] agrees to follow up with our clinic in 2 weeks.  Hyperglycemia Fasting labs will be obtained and results with be discussed with [name] [name] in 2 weeks at her follow up visit. In the meanwhile Syona was started on a lower simple carbohydrate diet and will work on weight loss efforts.  Dyspnea with Activity We will order indirect calorimetry and will check labs. She has agreed to work on weight loss and gradually increase exercise to treat her exercise induced shortness of breath. If [name] [name] follows our instructions and loses weight without improvement of her shortness of breath, we will plan to refer to pulmonology. [name] [name] agrees to this plan. [name] [name] agrees to follow up with our clinic in 2 weeks.  Hypertriglyceridemia [name] [name] was informed of the American Heart Association Guidelines emphasizing intensive lifestyle  modifications as the first line treatment for hypertriglyceridemia. We discussed many lifestyle modifications today in depth, and [name] [name] will continue to work on decreasing saturated fats such as fatty red meat, butter and many fried foods. She will also increase vegetables and lean protein in her diet and continue to work on exercise and weight loss efforts. We will check labs and [name] [name] agrees to follow up with our clinic in 2  weeks.  Depression Screen [name] [name] had a strongly positive depression screening. Depression is commonly associated with obesity and often results in emotional eating behaviors. We will monitor this closely and work on CBT to help improve the non-hunger eating patterns. Referral to Psychology may be required if no improvement is seen as she continues in our clinic.  Obesity [name] [name] is currently in the action stage of change and her goal is to continue with weight loss efforts She has agreed to follow the Category 2 plan +100 calories [name] [name] has been instructed to work up to a goal of 150 minutes of combined cardio and strengthening exercise per week for weight loss and overall health benefits. We discussed the following Behavioral Modification Strategies today: no skipping meals, increasing lean protein intake and decreasing simple carbohydrates   Keira has agreed to follow up with our clinic in 2 weeks. She was informed of the importance of frequent follow up visits to maximize her success with intensive lifestyle modifications for her multiple health conditions. She was informed we would discuss her lab results at her next visit unless there is a critical issue that needs to be addressed sooner. Jasmyn agreed to keep her next visit at the agreed upon time to discuss these results.  I, Doreene Nest, am acting as transcriptionist for Dennard Nip, MD  I have reviewed the above documentation for accuracy and completeness, and I agree with the above. -Dennard Nip, MD    OBESITY BEHAVIORAL INTERVENTION VISIT  Today's visit was # 1 out of 35.  Starting weight: 267 lbs Starting date: 09/17/17 Today's weight : 267 lbs  Today's date: 09/17/2017 Total lbs lost to date: 0 (Patients must lose 7 lbs in the first 6 months to continue with counseling)   ASK: We discussed the diagnosis of obesity with [name] today and Birda agreed to give [place] permission to discuss obesity behavioral  modification therapy today.  ASSESS: Kiffany has the diagnosis of obesity and her BMI today is 44.81 Rande is in the action stage of change   ADVISE: Tokiko was educated on the multiple health risks of obesity as well as the benefit of weight loss to improve her health. She was advised of the need for long term treatment and the importance of lifestyle modifications.  AGREE: Multiple dietary modification options and treatment options were discussed and  Chaney agreed to follow the Category 2 plan +100 calories We discussed the following Behavioral Modification Strategies today: no skipping meals, increasing lean protein intake and decreasing simple carbohydrates

## 2017-09-18 ENCOUNTER — Encounter (HOSPITAL_COMMUNITY): Payer: Medicare Other

## 2017-09-18 ENCOUNTER — Encounter: Payer: Self-pay | Admitting: Internal Medicine

## 2017-09-18 ENCOUNTER — Ambulatory Visit (INDEPENDENT_AMBULATORY_CARE_PROVIDER_SITE_OTHER): Payer: Medicare Other | Admitting: Internal Medicine

## 2017-09-18 DIAGNOSIS — R911 Solitary pulmonary nodule: Secondary | ICD-10-CM | POA: Diagnosis not present

## 2017-09-18 DIAGNOSIS — R918 Other nonspecific abnormal finding of lung field: Secondary | ICD-10-CM | POA: Diagnosis not present

## 2017-09-18 DIAGNOSIS — Z6841 Body Mass Index (BMI) 40.0 and over, adult: Secondary | ICD-10-CM | POA: Diagnosis not present

## 2017-09-18 LAB — COMPREHENSIVE METABOLIC PANEL
A/G RATIO: 1.3 (ref 1.2–2.2)
ALT: 16 IU/L (ref 0–32)
AST: 30 IU/L (ref 0–40)
Albumin: 3.8 g/dL (ref 3.5–4.8)
Alkaline Phosphatase: 81 IU/L (ref 39–117)
BUN/Creatinine Ratio: 13 (ref 12–28)
BUN: 14 mg/dL (ref 8–27)
Bilirubin Total: 0.4 mg/dL (ref 0.0–1.2)
CALCIUM: 8.9 mg/dL (ref 8.7–10.3)
CO2: 21 mmol/L (ref 20–29)
Chloride: 102 mmol/L (ref 96–106)
Creatinine, Ser: 1.07 mg/dL — ABNORMAL HIGH (ref 0.57–1.00)
GFR calc Af Amer: 58 mL/min/{1.73_m2} — ABNORMAL LOW (ref >59)
GFR calc non Af Amer: 51 mL/min/{1.73_m2} — ABNORMAL LOW (ref >59)
GLUCOSE: 97 mg/dL (ref 65–99)
Globulin, Total: 3 g/dL (ref 1.5–4.5)
POTASSIUM: 4.2 mmol/L (ref 3.5–5.2)
Sodium: 139 mmol/L (ref 134–144)
TOTAL PROTEIN: 6.8 g/dL (ref 6.0–8.5)

## 2017-09-18 LAB — LIPID PANEL WITH LDL/HDL RATIO
CHOLESTEROL TOTAL: 164 mg/dL (ref 100–199)
HDL: 47 mg/dL (ref >39)
LDL Calculated: 94 mg/dL (ref 0–99)
LDl/HDL Ratio: 2 ratio (ref 0.0–3.2)
Triglycerides: 117 mg/dL (ref 0–149)
VLDL CHOLESTEROL CAL: 23 mg/dL (ref 5–40)

## 2017-09-18 LAB — CBC WITH DIFFERENTIAL
BASOS ABS: 0 10*3/uL (ref 0.0–0.2)
Basos: 0 %
EOS (ABSOLUTE): 0.2 10*3/uL (ref 0.0–0.4)
Eos: 3 %
Hematocrit: 45.1 % (ref 34.0–46.6)
Hemoglobin: 14.9 g/dL (ref 11.1–15.9)
IMMATURE GRANS (ABS): 0 10*3/uL (ref 0.0–0.1)
IMMATURE GRANULOCYTES: 1 %
LYMPHS: 24 %
Lymphocytes Absolute: 2 10*3/uL (ref 0.7–3.1)
MCH: 28.5 pg (ref 26.6–33.0)
MCHC: 33 g/dL (ref 31.5–35.7)
MCV: 86 fL (ref 79–97)
MONOS ABS: 1.2 10*3/uL — AB (ref 0.1–0.9)
Monocytes: 14 %
NEUTROS PCT: 58 %
Neutrophils Absolute: 5 10*3/uL (ref 1.4–7.0)
RBC: 5.22 x10E6/uL (ref 3.77–5.28)
RDW: 13.5 % (ref 12.3–15.4)
WBC: 8.5 10*3/uL (ref 3.4–10.8)

## 2017-09-18 LAB — HEMOGLOBIN A1C
ESTIMATED AVERAGE GLUCOSE: 131 mg/dL
Hgb A1c MFr Bld: 6.2 % — ABNORMAL HIGH (ref 4.8–5.6)

## 2017-09-18 LAB — T4, FREE: FREE T4: 1.04 ng/dL (ref 0.82–1.77)

## 2017-09-18 LAB — INSULIN, RANDOM: INSULIN: 47.7 u[IU]/mL — AB (ref 2.6–24.9)

## 2017-09-18 LAB — VITAMIN D 25 HYDROXY (VIT D DEFICIENCY, FRACTURES): Vit D, 25-Hydroxy: 49.4 ng/mL (ref 30.0–100.0)

## 2017-09-18 LAB — T3: T3 TOTAL: 140 ng/dL (ref 71–180)

## 2017-09-18 LAB — TSH: TSH: 2.04 u[IU]/mL (ref 0.450–4.500)

## 2017-09-18 NOTE — Progress Notes (Signed)
HPI  female former smoker followed for dyspnea on exertion, lung nodules, fibrosis, complicated by A. 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 She does not qualify for portable oxygen CT chest 01/06/17-pulmonary fibrosis, small nodules ANA 1:160 ------------------------------------------------------------------------------------------  06/30/17- 76 year old female former smoker followed for dyspnea on exertion, lung nodules, fibrosis, ANA 7:353, complicated by A. Fib/ pacemaker/Eliquis ,a1AT carrierMZ, obesity OSA/CPAP/Advanced-cardiology FOLLOWS FOR: Pt continues to have SOB with exertion. Pt states Breo inhaler has caused her to have burning in mouth and no taste. Pt has been rinising her mouth well after use.  Likes her pulmonary rehabilitation course. Cardiology manages her CPAP/Advanced? 94 We reviewed her CT image. There is minimal subpleural fibrosis, nonspecific. Tiny nodules have been stable. CT chest 05/05/17 IMPRESSION: Scattered mild subpleural nodularity in the lungs bilaterally, measuring up to 4 x 9 mm in the right lower lobe, likely benign. Three month stability has been demonstrated. Follow-up CT chest in 15-21 months is considered optional for low-risk patients, but is recommended for high-risk patients. This recommendation follows the consensus statement: Guidelines for Management of Incidental Pulmonary Nodules Detected on CT Images: From the Fleischner Society 2017; Radiology 2017; [phone].  09/18/17- 76 year old female former smoker followed for dyspnea on exertion, lung nodules, fibrosis, ANA 4:196, complicated by A. 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 Sleeps better with CPAP- managed by  cardiology No real change in DOE and no significant cough or wheeze.  No acute events.  ROS-see HPI   + = pos Constitutional:    weight loss, night sweats, fevers, chills, fatigue, lassitude. HEENT:    headaches, difficulty swallowing, tooth/dental problems, sore throat,       sneezing, itching, ear ache, nasal congestion, post nasal drip, snoring CV:    chest pain, orthopnea, PND, swelling in lower extremities, anasarca,                                                   dizziness, palpitations Resp:   +shortness of breath with exertion or at rest.                productive cough,   non-productive cough, coughing up of blood.              change in color of mucus.  wheezing.   Skin:    rash or lesions. GI:  No-   heartburn, indigestion, abdominal pain, nausea, vomiting, diarrhea,                 change in bowel habits, loss of appetite GU: dysuria, change in color of urine, no urgency or frequency.   flank pain. MS:   joint pain, stiffness, decreased range of motion, back pain. Neuro-     nothing unusual Psych:  change in mood or affect.  depression or anxiety.   memory loss.  OBJ- Physical Exam General- Alert, Oriented, Affect-appropriate, Distress- none acute, + obese Skin- rash-none, lesions- none, excoriation- none Lymphadenopathy- none Head- atraumatic            Eyes- Gross vision intact, PERRLA, conjunctivae and secretions clear            Ears- Hearing, canals-normal  Nose- Clear, no-Septal dev, mucus, polyps, erosion, perforation             Throat- Mallampati II , mucosa clear , drainage- none, tonsils- atrophic Neck- flexible , trachea midline, no stridor , thyroid nl, carotid no bruit Chest - symmetrical excursion , unlabored           Heart/CV- RRR , no murmur , no gallop  , no rub, nl s1 s2                           - JVD- none , edema- none, stasis changes- none, varices- none           Lung- + clear, wheeze- none, cough- none , dullness-none, rub- none            Chest wall- +Pacemaker Left Abd-  Br/ Gen/ Rectal- Not done, not indicated Extrem- cyanosis- none, clubbing, none, atrophy- none, strength- nl Neuro- grossly intact to observation

## 2017-09-18 NOTE — Patient Instructions (Addendum)
Order- schedule future CT chest no contrast   June, 2019, dx multiple lung nodules  Please call as needed

## 2017-09-19 ENCOUNTER — Telehealth: Payer: Self-pay | Admitting: Family Medicine

## 2017-09-19 MED ORDER — ZOSTER VAC RECOMB ADJUVANTED 50 MCG/0.5ML IM SUSR: 0.5000 mL | Freq: Once | INTRAMUSCULAR | 1 refills | Status: AC

## 2017-09-19 NOTE — Telephone Encounter (Signed)
Ok to send prescription to pharmacy

## 2017-09-19 NOTE — Telephone Encounter (Signed)
Please advise?  

## 2017-09-19 NOTE — Telephone Encounter (Signed)
Pt informed and rx sent to pharmacy as requested

## 2017-09-19 NOTE — Telephone Encounter (Signed)
Pt asking if she could get a shingles shot, please advise

## 2017-09-22 NOTE — Progress Notes (Signed)
Pulmonary Individual Treatment Plan  Patient Details  Name: [name] [name] MRN: [id number] Date of Birth: 1941-08-13 Referring Provider:     Pulmonary Rehab Walk Test from 06/17/2017 in [facility]  Referring Provider  Dr. Annamaria Boots      Initial Encounter Date:    Pulmonary Rehab Walk Test from 06/17/2017 in [place]  Date  06/19/17  Referring Provider  Dr. Annamaria Boots      Visit Diagnosis: Dyspnea on exertion  Patient's Home Medications on Admission:   Current Outpatient Prescriptions:  .  apixaban (ELIQUIS) 5 MG TABS tablet, Take 1 tablet (5 mg total) by mouth 2 (two) times daily., Disp: 28 tablet, Rfl: 0 .  Biotin 5000 MCG CAPS, Take 5,000 mcg by mouth daily. , Disp: , Rfl:  .  Calcium Carbonate-Vit D-Min (CALCIUM 1200 PO), Take 2,400 mg by mouth daily. , Disp: , Rfl:  .  cholecalciferol (VITAMIN D) 1000 UNITS tablet, Take 1,000 Units by mouth daily. , Disp: , Rfl:  .  Co-Enzyme Q10 100 MG CAPS, Take 100 mg by mouth daily. , Disp: , Rfl:  .  diazepam (VALIUM) 5 MG tablet, TAKE 1 TABLET BY MOUTH ONCE DAILY AS NEEDED FOR ANXIETY, Disp: 30 tablet, Rfl: 1 .  folic acid (FOLVITE) 826 MCG tablet, Take 400 mcg by mouth daily., Disp: , Rfl:  .  glucosamine-chondroitin 500-400 MG tablet, Take 1 tablet by mouth daily. , Disp: , Rfl:  .  loratadine (KLS ALLERCLEAR) 10 MG tablet, Take 10 mg by mouth daily., Disp: , Rfl:  .  MAGNESIUM CITRATE PO, Take 250 mg by mouth daily. 1 tab daily, Disp: , Rfl:  .  metoprolol tartrate (LOPRESSOR) 25 MG tablet, Take 0.5 tablets (12.5 mg total) by mouth 2 (two) times daily., Disp: 90 tablet, Rfl: 3 .  nitroGLYCERIN (NITROSTAT) 0.4 MG SL tablet, Place 0.4 mg under the tongue every 5 (five) minutes as needed for chest pain., Disp: , Rfl:  .  pantoprazole (PROTONIX) 40 MG tablet, TAKE 1 TABLET BY MOUTH DAILY, Disp: 60 tablet, Rfl: 1 .  Resveratrol 250 MG CAPS, Take 250 mg by mouth daily., Disp: , Rfl:  .   sertraline (ZOLOFT) 100 MG tablet, Take 1 tablet (100 mg total) by mouth daily., Disp: 90 tablet, Rfl: 0 .  Simethicone (GAS-X PO), Take 2 tablets by mouth daily as needed (bloating)., Disp: , Rfl:   Past Medical History: Past Medical History:  Diagnosis Date  . Alpha-1-antitrypsin deficiency carrier ([place])   . Arthritis    "all over"  . Atrial fibrillation ([place])   . Back pain   . Complication of anesthesia    "I woke up during 2 different procedures" (10/04/2014)  . Frequent UTI   . GERD (gastroesophageal reflux disease)   . Hypertension   . MVP (mitral valve prolapse)   . Obesity   . Osteoarthritis   . Presence of permanent cardiac pacemaker   . PVC's (premature ventricular contractions)   . Shortness of breath   . 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 Sleep apnea   . Stroke [facility]) 01/2014   denies deficits on 10/04/2014  . Tachy-brady syndrome ([place]) 10/2014  . TIA (transient ischemic attack)     Tobacco Use: History  Smoking Status  . Former Smoker  . Packs/day: 2.00  . Years: 18.00  . Types: Cigarettes  Smokeless Tobacco  . Never Used  Comment: "quit smoking in 1971"    Labs: Recent Review Flowsheet Data    Labs for ITP Cardiac and Pulmonary Rehab Latest Ref Rng & Units 03/09/2014 10/25/2014 07/15/2016 01/15/2017 09/17/2017   Cholestrol 100 - 199 mg/dL 152 184 163 182 164   LDLCALC 0 - 99 mg/dL 77 138 - 94 94   LDLDIRECT mg/dL - - 81.0 - -   HDL >39 mg/dL 58 46 45.70 54.60 47   Trlycerides 0 - 149 mg/dL 87 210(A) 208.0(H) 168.0(H) 117   Hemoglobin A1c 4.8 - 5.6 % 5.9(H) - - - 6.2(H)   TCO2 0 - 100 mmol/L - - - - -      Capillary Blood Glucose: Lab Results  Component Value Date   GLUCAP 103 (H) 03/09/2014   GLUCAP 103 (H) 03/09/2014   GLUCAP 112 (H) 03/09/2014       POCT Glucose    Row Name 07/15/17 1555             POCT Blood Glucose   Pre-Exercise 220 mg/dL       Post-Exercise 165 mg/dL           Pulmonary Assessment Scores:     Pulmonary Assessment Scores    Row Name 06/19/17 0728         ADL UCSD   ADL Phase Entry       mMRC Score   mMRC Score 4        Pulmonary Function Assessment:     Pulmonary Function Assessment - 06/09/17 1315      Breath   Bilateral Breath Sounds Clear   Shortness of Breath Yes;Limiting activity;Fear of Shortness of Breath      Exercise Target Goals:    Exercise Program Goal: Individual exercise prescription set with THRR, safety & activity barriers. Participant demonstrates ability to understand and report RPE using BORG scale, to self-measure pulse accurately, and to acknowledge the importance of the exercise prescription.  Exercise Prescription Goal: Starting with aerobic activity 30 plus minutes a day, 3 days per week for initial exercise prescription. Provide home exercise prescription and guidelines that participant acknowledges understanding prior to discharge.  Activity Barriers & Risk Stratification:   6 Minute Walk:     6 Minute Walk    Row Name 06/19/17 0723         6 Minute Walk   Phase Initial     Distance 800 feet     Walk Time -  4 minutes 50 seconds total     # of Rest Breaks 2  1st: 40 seconds 2nd: 30 seconds     MPH 1.5     METS 2.15     RPE 13     Perceived Dyspnea  3     Symptoms Yes (comment)     Comments used wheelchair/5/10 hip pain/limited by shortness of breath     Resting HR 90 bpm     Resting BP 105/66     Max Ex. HR 127 bpm     Max Ex. BP 105/65       Interval HR   Baseline HR (retired) 90     1 Minute HR 144     2 Minute HR 127     3 Minute HR 102     4 Minute HR 98     5 Minute HR 98     6 Minute HR 102     2 Minute Post HR 95     Interval Heart  Rate? Yes       Interval Oxygen   Interval Oxygen? Yes     Baseline Oxygen Saturation % 94 %     Resting Liters of Oxygen 0 L     1 Minute Oxygen Saturation % 93 %     1 Minute Liters of Oxygen 0 L     2 Minute Oxygen  Saturation % 93 %     2 Minute Liters of Oxygen 0 L     3 Minute Oxygen Saturation % 93 %     3 Minute Liters of Oxygen 0 L     4 Minute Oxygen Saturation % 94 %     4 Minute Liters of Oxygen 0 L     5 Minute Oxygen Saturation % 92 %     5 Minute Liters of Oxygen 0 L     6 Minute Oxygen Saturation % 92 %     6 Minute Liters of Oxygen 0 L     2 Minute Post Oxygen Saturation % 93 %     2 Minute Post Liters of Oxygen 0 L        Oxygen Initial Assessment:     Oxygen Initial Assessment - 06/19/17 0726      Initial 6 min Walk   Oxygen Used None   Resting Oxygen Saturation  94 %   Exercise Oxygen Saturation  during 6 min walk 92 %     Program Oxygen Prescription   Program Oxygen Prescription None      Oxygen Re-Evaluation:     Oxygen Re-Evaluation    Row Name 07/01/17 1924 07/30/17 2103 08/22/17 1357 09/22/17 0834       Program Oxygen Prescription   Program Oxygen Prescription None None None None      Home Oxygen   Home Oxygen Device None None None None    Sleep Oxygen Prescription CPAP CPAP CPAP CPAP    Liters per minute 0  -  - 0    Home Exercise Oxygen Prescription None None None None    Home at Rest Exercise Oxygen Prescription None None None None    Compliance with Home Oxygen Use Yes  -  - Yes       Oxygen Discharge (Final Oxygen Re-Evaluation):     Oxygen Re-Evaluation - 09/22/17 0834      Program Oxygen Prescription   Program Oxygen Prescription None     Home Oxygen   Home Oxygen Device None   Sleep Oxygen Prescription CPAP   Liters per minute 0   Home Exercise Oxygen Prescription None   Home at Rest Exercise Oxygen Prescription None   Compliance with Home Oxygen Use Yes      Initial Exercise Prescription:     Initial Exercise Prescription - 06/19/17 0700      Date of Initial Exercise RX and Referring Provider   Date 06/19/17   Referring Provider Dr. Annamaria Boots     Recumbant Bike   Level 1   Minutes 17     NuStep   Level 2   Minutes 17    METs 1.5     Track   Laps 4   Minutes 17     Prescription Details   Frequency (times per week) 2   Duration Progress to 45 minutes of aerobic exercise without signs/symptoms of physical distress     Intensity   THRR 40-80% of Max Heartrate 58-115   Ratings of Perceived Exertion 11-13   Perceived Dyspnea 0-4  Progression   Progression Continue progressive overload as per policy without signs/symptoms or physical distress.     Resistance Training   Training Prescription Yes   Weight orange bands   Reps 10-15      Perform Capillary Blood Glucose checks as needed.  Exercise Prescription Changes:     Exercise Prescription Changes    Row Name 06/24/17 1500 06/26/17 1500 07/01/17 1500 07/03/17 1500 07/08/17 1500     Response to Exercise   Blood Pressure (Admit) 104/60 120/60 100/56 96/60 110/64   Blood Pressure (Exercise) 124/70 130/84 122/82 130/86 118/70   Blood Pressure (Exit) 100/60 108/74 104/60 126/60 108/78   Heart Rate (Admit) 79 bpm 78 bpm 97 bpm 79 bpm 72 bpm   Heart Rate (Exercise) 105 bpm 94 bpm 107 bpm 105 bpm 94 bpm   Heart Rate (Exit) 83 bpm 70 bpm 79 bpm 71 bpm 76 bpm   Oxygen Saturation (Admit) 94 % 94 % 96 % 96 % 94 %   Oxygen Saturation (Exercise) 94 % 93 % 93 % 87 %  Sat increased to 92% with rest 93 %  Sat increased to 92% with rest   Oxygen Saturation (Exit) 93 % 95 % 94 % 93 % 96 %   Rating of Perceived Exertion (Exercise) _0 Perceived Dyspnea (Exercise) _1 Duration Progress to 45 minutes of aerobic exercise without signs/symptoms of physical distress Progress to 45 minutes of aerobic exercise without signs/symptoms of physical distress Progress to 45 minutes of aerobic exercise without signs/symptoms of physical distress Progress to 45 minutes of aerobic exercise without signs/symptoms of physical distress Progress to 45 minutes of aerobic exercise without signs/symptoms of physical distress   Intensity Other (comment)   40-8-% of HRR THRR unchanged THRR unchanged THRR unchanged THRR unchanged     Progression   Progression Continue to progress workloads to maintain intensity without signs/symptoms of physical distress. Continue to progress workloads to maintain intensity without signs/symptoms of physical distress. Continue to progress workloads to maintain intensity without signs/symptoms of physical distress. Continue to progress workloads to maintain intensity without signs/symptoms of physical distress. Continue to progress workloads to maintain intensity without signs/symptoms of physical distress.     Resistance Training   Training Prescription _2    Weight _3    Reps 10-15 10-15 10-15 10-15 10-15   Time 10 Minutes 10 Minutes 10 Minutes 10 Minutes 10 Minutes     NuStep   Level _4 - 2   Minutes _5 - 17   METs 1.9 1.1 1.9  - 1.8     Arm Ergometer   Level _6 Minutes _7 Track   Laps 5  - _8 Minutes 17  - _9 Row Name 07/10/17 1500 07/15/17 1500 07/17/17 1500 07/24/17 1605 07/31/17 1600     Response to Exercise   Blood Pressure (Admit) 96/64 94/60 90/50 104/70 110/62   Blood Pressure (Exercise) 112/86 110/74 98/50 108/64 114/60   Blood Pressure (Exit) 100/60 100/64 100/60 104/60 98/0   Heart Rate (Admit) 75 bpm 70 bpm 80 bpm 74 bpm 80 bpm   Heart Rate (Exercise) 88 bpm 77 bpm 94 bpm 88 bpm 88 bpm   Heart Rate (Exit) 74 bpm 92  bpm 75 bpm 86 bpm 83 bpm   Oxygen Saturation (Admit) 96 % 93 % 96 % 93 % 94 %   Oxygen Saturation (Exercise) 95 % 94 % 90 % 94 % 95 %   Oxygen Saturation (Exit) 96 % 96 % 95 % 96 % 93 %   Rating of Perceived Exertion (Exercise) _0 Perceived Dyspnea (Exercise) _1 Duration Progress to 45 minutes of aerobic exercise without signs/symptoms of physical distress Progress to 45 minutes of aerobic exercise without signs/symptoms of  physical distress Progress to 45 minutes of aerobic exercise without signs/symptoms of physical distress Progress to 45 minutes of aerobic exercise without signs/symptoms of physical distress Progress to 45 minutes of aerobic exercise without signs/symptoms of physical distress   Intensity _2      Progression   Progression Continue to progress workloads to maintain intensity without signs/symptoms of physical distress. Continue to progress workloads to maintain intensity without signs/symptoms of physical distress. Continue to progress workloads to maintain intensity without signs/symptoms of physical distress. Continue to progress workloads to maintain intensity without signs/symptoms of physical distress. Continue to progress workloads to maintain intensity without signs/symptoms of physical distress.     Resistance Training   Training Prescription _3    Weight _4    Reps 10-15 10-15 10-15 10-15 10-15   Time 10 Minutes 10 Minutes 10 Minutes 10 Minutes 10 Minutes     NuStep   Level 3 4  - 4 5   Minutes 17 17  - 17 17   METs 2.4 2  - 1.4 1.7     Arm Ergometer   Level 3 3  - 5 6   Minutes 17 17  - 17 17   Row Name 08/05/17 1500 08/12/17 1559 08/14/17 1500 08/14/17 1546 08/19/17 1555     Response to Exercise   Blood Pressure (Admit) 100/60 100/62  - 98/62 98/62   Blood Pressure (Exercise) 122/66 104/62  - 98/56 97/67   Blood Pressure (Exit) 110/60 104/64  - 100/54 114/74   Heart Rate (Admit) 76 bpm 70 bpm  - 77 bpm 68 bpm   Heart Rate (Exercise) 94 bpm 99 bpm  - 86 bpm 100 bpm   Heart Rate (Exit) 65 bpm 70 bpm  - 82 bpm 90 bpm   Oxygen Saturation (Admit) 96 % 95 %  - 95 % 94 %   Oxygen Saturation (Exercise) 94 % 96 %  - 95 % 94 %   Oxygen Saturation (Exit) 91 % 95 %  - 96 % 94 %   Rating of Perceived Exertion (Exercise) 14 13  - 12 14    Perceived Dyspnea (Exercise) 3 3  - 2 3   Duration Progress to 45 minutes of aerobic exercise without signs/symptoms of physical distress Progress to 45 minutes of aerobic exercise without signs/symptoms of physical distress  - Progress to 45 minutes of aerobic exercise without signs/symptoms of physical distress Progress to 45 minutes of aerobic exercise without signs/symptoms of physical distress   Intensity THRR unchanged THRR unchanged  - THRR unchanged THRR unchanged     Progression   Progression Continue to progress workloads to maintain intensity without signs/symptoms of physical distress. Continue to progress workloads to maintain intensity without signs/symptoms of physical distress.  - Continue to progress workloads to maintain intensity  without signs/symptoms of physical distress. Continue to progress workloads to maintain intensity without signs/symptoms of physical distress.     Resistance Training   Training Prescription Yes Yes  - Yes Yes   Weight orange bands orange bands  - orange bands orange bands   Reps 10-15 10-15  - 10-15 10-15   Time 10 Minutes 10 Minutes  - 10 Minutes 10 Minutes     NuStep   Level 5 5  - 5 5   Minutes 17 17  - 51 17   METs 2.2 1.9  - 2 2.1     Arm Ergometer   Level 6.5 6.5  -  - 6   Minutes 17 17  -  - 17     Track   Laps  -  -  -  - 8   Minutes  -  -  -  - 17     Home Exercise Plan   Plans to continue exercise at  -  - [employer] (comment)  water treading at [employer] and walking at home  -  -   Frequency  -  - Add 2 additional days to program exercise sessions.  -  -   Row Name 08/21/17 1600 08/26/17 1500 08/28/17 1500 09/09/17 1500 09/16/17 1529     Response to Exercise   Blood Pressure (Admit) 92/68 104/64 100/60 96/64 144/64   Blood Pressure (Exercise) 112/62 110/60 130/74 110/66 120/74   Blood Pressure (Exit) 100/80 118/62 98/64 104/60 126/60   Heart Rate (Admit) 80 bpm 83 bpm 71 bpm 64 bpm 66 bpm   Heart Rate (Exercise) 98 bpm  110 bpm 88 bpm 94 bpm 88 bpm   Heart Rate (Exit) 86 bpm 78 bpm 69 bpm 87 bpm 61 bpm   Oxygen Saturation (Admit) 96 % 95 % 96 % 96 % 95 %   Oxygen Saturation (Exercise) 95 % 93 % 95 % 93 % 94 %   Oxygen Saturation (Exit) 96 % 95 % 94 % 93 % 91 %   Rating of Perceived Exertion (Exercise) _0 Perceived Dyspnea (Exercise) _1 Duration Progress to 45 minutes of aerobic exercise without signs/symptoms of physical distress Progress to 45 minutes of aerobic exercise without signs/symptoms of physical distress Progress to 45 minutes of aerobic exercise without signs/symptoms of physical distress Progress to 45 minutes of aerobic exercise without signs/symptoms of physical distress Progress to 45 minutes of aerobic exercise without signs/symptoms of physical distress   Intensity _2      Progression   Progression Continue to progress workloads to maintain intensity without signs/symptoms of physical distress. Continue to progress workloads to maintain intensity without signs/symptoms of physical distress. Continue to progress workloads to maintain intensity without signs/symptoms of physical distress. Continue to progress workloads to maintain intensity without signs/symptoms of physical distress. Continue to progress workloads to maintain intensity without signs/symptoms of physical distress.     Resistance Training   Training Prescription _3    Weight _4    Reps 10-15 10-15 10-15 10-15 10-15   Time 10 Minutes 10 Minutes 10 Minutes 10 Minutes 10 Minutes     NuStep   Level _5 Minutes _6 34   METs 1.2 2.3 2 2.2 2.2     Arm Ergometer  Level 6 6 6.5  -  -   Minutes _0 -  -     Track   Laps  - 9  - 12 12   Minutes  - 17  - 17 17      Exercise Comments:     Exercise Comments    Row Name 08/14/17 1530            Exercise Comments home exercise completed          Exercise Goals and Review:   Exercise Goals Re-Evaluation :     Exercise Goals Re-Evaluation    [place] Name 06/27/17 1042 07/29/17 1005 08/18/17 1402 09/20/17 1337       Exercise Goal Re-Evaluation   Exercise Goals Review Increase Physical Activity;Increase Strenth and Stamina Increase Physical Activity;Increase Strength and Stamina;Able to understand and use Dyspnea scale;Able to understand and use rate of perceived exertion (RPE) scale;Knowledge and understanding of Target Heart Rate Range (THRR);Understanding of Exercise Prescription Increase Strength and Stamina;Increase Physical Activity;Able to understand and use Dyspnea scale;Able to understand and use rate of perceived exertion (RPE) scale;Knowledge and understanding of Target Heart Rate Range (THRR);Understanding of Exercise Prescription Increase Physical Activity;Increase Strength and Stamina;Able to understand and use Dyspnea scale;Able to understand and use rate of perceived exertion (RPE) scale;Knowledge and understanding of Target Heart Rate Range (THRR);Understanding of Exercise Prescription    Comments Patient has only attended two exercise sessions. Will cont. to monitor and progress as able.  Patient is progressing slowly in program. She is limited by orthopedic issues. Will cont. to progress as able. Patient is doing well in program. She is showing new initiative to push herself. Home exercise completed. Hesitant to exercise at home. Will cont. to monitor and motivate patient.  Patient has been progressing well. Mets still range in "low" level. Works hard while she is here. Patient has missed a few sessions recently because her daughter is in the hospital.    Expected Outcomes Through exercising at rehab and at home, patient will increase physical strength and stamina and find ADL's easier to perform.  Through exercising at rehab and at home, patient will increase  physical strength and stamina and find ADL's easier to perform.  Through exercising at rehab and at home, patient will increase physical strength and stamina and find ADL's easier to perform.  Through exercising at rehab and at home, patient will increase physical strength and stamina and find ADL's easier to perform.        Discharge Exercise Prescription (Final Exercise Prescription Changes):     Exercise Prescription Changes - 09/16/17 1529      Response to Exercise   Blood Pressure (Admit) 144/64   Blood Pressure (Exercise) 120/74   Blood Pressure (Exit) 126/60   Heart Rate (Admit) 66 bpm   Heart Rate (Exercise) 88 bpm   Heart Rate (Exit) 61 bpm   Oxygen Saturation (Admit) 95 %   Oxygen Saturation (Exercise) 94 %   Oxygen Saturation (Exit) 91 %   Rating of Perceived Exertion (Exercise) 13   Perceived Dyspnea (Exercise) 2   Duration Progress to 45 minutes of aerobic exercise without signs/symptoms of physical distress   Intensity THRR unchanged     Progression   Progression Continue to progress workloads to maintain intensity without signs/symptoms of physical distress.     Resistance Training   Training Prescription Yes   Weight orange bands   Reps 10-15   Time 10 Minutes  NuStep   Level 6   Minutes 34   METs 2.2     Track   Laps 12   Minutes 17      Nutrition:  Target Goals: Understanding of nutrition guidelines, daily intake of sodium <1581m, cholesterol <2081m calories 30% from fat and 7% or less from saturated fats, daily to have 5 or more servings of fruits and vegetables.  Biometrics:    Nutrition Therapy Plan and Nutrition Goals:     Nutrition Therapy & Goals - 06/19/17 0842      Nutrition Therapy   Diet General, Healthful     Personal Nutrition Goals   Nutrition Goal Identify food quantities necessary to achieve wt loss of  -2# per week to a goal wt loss of 2.7-10.9 kg (6-24 lb) at graduation from pulmonary rehab.     Intervention Plan    Intervention Prescribe, educate and counsel regarding individualized specific dietary modifications aiming towards targeted core components such as weight, hypertension, lipid management, diabetes, heart failure and other comorbidities.   Expected Outcomes Short Term Goal: Understand basic principles of dietary content, such as calories, fat, sodium, cholesterol and nutrients.;Long Term Goal: Adherence to prescribed nutrition plan.      Nutrition Discharge: Rate Your Plate Scores:     Nutrition Assessments - 06/19/17 0836      Rate Your Plate Scores   Pre Score 46      Nutrition Goals Re-Evaluation:   Nutrition Goals Discharge (Final Nutrition Goals Re-Evaluation):   Psychosocial: Target Goals: Acknowledge presence or absence of significant depression and/or stress, maximize coping skills, provide positive support system. Participant is able to verbalize types and ability to use techniques and skills needed for reducing stress and depression.  Initial Review & Psychosocial Screening:     Initial Psych Review & Screening - 06/09/17 1328      Initial Review   Current issues with None Identified     Family Dynamics   Good Support System? Yes     Barriers   Psychosocial barriers to participate in program There are no identifiable barriers or psychosocial needs.     Screening Interventions   Interventions Encouraged to exercise      Quality of Life Scores:   PHQ-9: Recent Review Flowsheet Data    [facility] 09/17/2017 06/09/2017 01/15/2017 07/15/2016   Decreased Interest 2 3 0 0   Down, Depressed, Hopeless 2 - 0  0   PHQ - 2 Score 4 3 0 0   Altered sleeping 0 0 - -   Tired, decreased energy 3 3 - -   Change in appetite 3 3 - -   Feeling bad or failure about yourself  2 3 - -   Trouble concentrating 3 0 - -   Moving slowly or fidgety/restless 0 0 - -   Suicidal thoughts 0 0 - -   PHQ-9 Score 15 12 - -   Difficult doing work/chores Very difficult Not  difficult at all - -     Interpretation of Total Score  Total Score Depression Severity:  1-4 = Minimal depression, 5-9 = Mild depression, 10-14 = Moderate depression, 15-19 = Moderately severe depression, 20-27 = Severe depression   Psychosocial Evaluation and Intervention:     Psychosocial Evaluation - 06/09/17 1330      Psychosocial Evaluation & Interventions   Interventions Encouraged to exercise with the program and follow exercise prescription   Comments dealing depression, but is working on making herself happy.  Continue Psychosocial Services  No Follow up required      Psychosocial Re-Evaluation:     Psychosocial Re-Evaluation    [place] Name 07/01/17 1926 07/30/17 2105 08/22/17 1357 09/22/17 0836       Psychosocial Re-Evaluation   Current issues with None Identified None Identified Current Stress Concerns Current Stress Concerns    Comments  -  - patient worries about dauther who has terminal pulmonary disease and requires high flow oxygen. She worries that she is unable to help her daughter because of her own shortness of breath and deconditioning.  patient worries about dauther who has terminal pulmonary disease and requires high flow oxygen. She worries that she is unable to help her daughter because of her own shortness of breath and deconditioning.  her daughter has been hospitalized over the last week since she ran out of oxygen from the [place] and the patient has not been at her rehab sessions.     Expected Outcomes  - patient will remain free from psychosocial barriers to participation patient will remain free from psychosocial barriers to participation patient will remain free from psychosocial barriers to participation    Interventions  - Encouraged to attend Pulmonary Rehabilitation for the exercise Encouraged to attend Pulmonary Rehabilitation for the exercise Encouraged to attend Pulmonary Rehabilitation for the exercise    Continue Psychosocial Services   - No  Follow up required No Follow up required Follow up required by staff    Comments  -  - daughters terminal pulmonary illness daughters terminal pulmonary illness      Initial Review   Source of Stress Concerns  -  - Family Family       Psychosocial Discharge (Final Psychosocial Re-Evaluation):     Psychosocial Re-Evaluation - 09/22/17 0836      Psychosocial Re-Evaluation   Current issues with Current Stress Concerns   Comments patient worries about dauther who has terminal pulmonary disease and requires high flow oxygen. She worries that she is unable to help her daughter because of her own shortness of breath and deconditioning.  her daughter has been hospitalized over the last week since she ran out of oxygen from the huricaine and the patient has not been at her rehab sessions.    Expected Outcomes patient will remain free from psychosocial barriers to participation   Interventions Encouraged to attend Pulmonary Rehabilitation for the exercise   Continue Psychosocial Services  Follow up required by staff   Comments daughters terminal pulmonary illness     Initial Review   Source of Stress Concerns Family      Education: Education Goals: Education classes will be provided on a weekly basis, covering required topics. Participant will state understanding/return demonstration of topics presented.  Learning Barriers/Preferences:     Learning Barriers/Preferences - 06/09/17 1314      Learning Barriers/Preferences   Learning Barriers None   Learning Preferences Pictoral      Education Topics: Risk Factor Reduction:  -Group instruction that is supported by a PowerPoint presentation. Instructor discusses the definition of a risk factor, different risk factors for pulmonary disease, and how the heart and lungs work together.     PULMONARY REHAB OTHER RESPIRATORY from 09/09/2017 in [place]  Date  06/26/17  Educator  ep  Instruction Review Code   2- meets goals/outcomes      Nutrition for Pulmonary Patient:  -Group instruction provided by PowerPoint slides, verbal discussion, and written materials to support subject matter. The instructor  gives an explanation and review of healthy diet recommendations, which includes a discussion on weight management, recommendations for fruit and vegetable consumption, as well as protein, fluid, caffeine, fiber, sodium, sugar, and alcohol. Tips for eating when patients are short of breath are discussed.   PULMONARY REHAB OTHER RESPIRATORY from 09/09/2017 in [place]  Date  07/24/17  Educator  edna  Instruction Review Code  2- meets goals/outcomes      Pursed Lip Breathing:  -Group instruction that is supported by demonstration and informational handouts. Instructor discusses the benefits of pursed lip and diaphragmatic breathing and detailed demonstration on how to preform both.     Oxygen Safety:  -Group instruction provided by PowerPoint, verbal discussion, and written material to support subject matter. There is an overview of "What is Oxygen" and "Why do we need it".  Instructor also reviews how to create a safe environment for oxygen use, the importance of using oxygen as prescribed, and the risks of noncompliance. There is a brief discussion on traveling with oxygen and resources the patient may utilize.   PULMONARY REHAB OTHER RESPIRATORY from 09/09/2017 in [place]  Date  07/10/17  Educator  Truddie Crumble  Instruction Review Code  2- meets goals/outcomes      Oxygen Equipment:  -Group instruction provided by [employer] Staff utilizing handouts, written materials, and equipment demonstrations.   Signs and Symptoms:  -Group instruction provided by written material and verbal discussion to support subject matter. Warning signs and symptoms of infection, stroke, and heart attack are reviewed and when to call the physician/911  reinforced. Tips for preventing the spread of infection discussed.   PULMONARY REHAB OTHER RESPIRATORY from 09/09/2017 in [place]  Date  08/21/17  Educator  rn  Instruction Review Code  2- meets goals/outcomes      Advanced Directives:  -Group instruction provided by verbal instruction and written material to support subject matter. Instructor reviews Advanced Directive laws and proper instruction for filling out document.   Pulmonary Video:  -Group video education that reviews the importance of medication and oxygen compliance, exercise, good nutrition, pulmonary hygiene, and pursed lip and diaphragmatic breathing for the pulmonary patient.   PULMONARY REHAB OTHER RESPIRATORY from 09/09/2017 in [place]  Date  08/28/17  Instruction Review Code  2- meets goals/outcomes      Exercise for the Pulmonary Patient:  -Group instruction that is supported by a PowerPoint presentation. Instructor discusses benefits of exercise, core components of exercise, frequency, duration, and intensity of an exercise routine, importance of utilizing pulse oximetry during exercise, safety while exercising, and options of places to exercise outside of rehab.     PULMONARY REHAB OTHER RESPIRATORY from 09/09/2017 in [place]  Date  08/12/17  Educator  ep  Instruction Review Code  2- meets goals/outcomes      Pulmonary Medications:  -Verbally interactive group education provided by instructor with focus on inhaled medications and proper administration.   PULMONARY REHAB OTHER RESPIRATORY from 09/09/2017 in [place]  Date  07/31/17  Educator  pharmacist  Instruction Review Code  2- meets goals/outcomes      Anatomy and Physiology of the Respiratory System and Intimacy:  -Group instruction provided by PowerPoint, verbal discussion, and written material to support subject  matter. Instructor reviews respiratory cycle and anatomical components of the respiratory system and their functions. Instructor also reviews  differences in obstructive and restrictive respiratory diseases with examples of each. Intimacy, Sex, and Sexuality differences are reviewed with a discussion on how relationships can change when diagnosed with pulmonary disease. Common sexual concerns are reviewed.   MD DAY -A group question and answer session with a medical doctor that allows participants to ask questions that relate to their pulmonary disease state.   PULMONARY REHAB OTHER RESPIRATORY from 09/09/2017 in [place]  Date  09/09/17  Educator  yacoub  Instruction Review Code  2- meets goals/outcomes      OTHER EDUCATION -Group or individual verbal, written, or video instructions that support the educational goals of the pulmonary rehab program.   Knowledge Questionnaire Score:   Core Components/Risk Factors/Patient Goals at Admission:     Personal Goals and Risk Factors at Admission - 06/09/17 1325      Core Components/Risk Factors/Patient Goals on Admission    Weight Management Obesity;Yes   Intervention Weight Management: Develop a combined nutrition and exercise program designed to reach desired caloric intake, while maintaining appropriate intake of nutrient and fiber, sodium and fats, and appropriate energy expenditure required for the weight goal.   Admit Weight 276 lb 0.3 oz (125.2 kg)   Goal Weight: Short Term 271 lb 2.7 oz (123 kg)   Goal Weight: Long Term 264 lb 8.8 oz (120 kg)   Expected Outcomes Short Term: Continue to assess and modify interventions until short term weight is achieved   Improve shortness of breath with ADL's Yes   Intervention Provide education, individualized exercise plan and daily activity instruction to help decrease symptoms of SOB with activities of daily living.   Expected Outcomes Short Term: Achieves a  reduction of symptoms when performing activities of daily living.   Develop more efficient breathing techniques such as purse lipped breathing and diaphragmatic breathing; and practicing self-pacing with activity Yes   Intervention Provide education, demonstration and support about specific breathing techniuqes utilized for more efficient breathing. Include techniques such as pursed lipped breathing, diaphragmatic breathing and self-pacing activity.   Expected Outcomes Short Term: Participant will be able to demonstrate and use breathing techniques as needed throughout daily activities.      Core Components/Risk Factors/Patient Goals Review:      Goals and Risk Factor Review    Row Name 07/01/17 1924 07/30/17 2104 08/22/17 1357 09/22/17 0834       Core Components/Risk Factors/Patient Goals Review   Personal Goals Review  - Weight Management/Obesity;Improve shortness of breath with ADL's;Develop more efficient breathing techniques such as purse lipped breathing and diaphragmatic breathing and practicing self-pacing with activity. Weight Management/Obesity;Improve shortness of breath with ADL's;Develop more efficient breathing techniques such as purse lipped breathing and diaphragmatic breathing and practicing self-pacing with activity.  -    Review patient has only attended 3 exercise sessions since admission and is too soon to evaluate progression towards rehab goals patient doing well in rehab. tolerating workload increases and utilizing PLB for her shortness of breath patient doing well in rehab. tolerating workload increases and utilizing PLB for her shortness of breath patient doing well in rehab. tolerating workload increases and utilizing PLB for her shortness of breath. she is s/p cardioversion two weeks ago but is unable to tell a difference in how she feels.     Expected Outcomes see admission expected outcomes see admission expected outcomes see admission expected outcomes see admission  expected outcomes       Core Components/Risk Factors/Patient Goals at Discharge (Final  Review):      Goals and Risk Factor Review - 09/22/17 0834      Core Components/Risk Factors/Patient Goals Review   Review patient doing well in rehab. tolerating workload increases and utilizing PLB for her shortness of breath. she is s/p cardioversion two weeks ago but is unable to tell a difference in how she feels.    Expected Outcomes see admission expected outcomes      ITP Comments:   Comments: ITP REVIEW Pt is making expected progress toward pulmonary rehab goals after completing 19 sessions. Recommend continued exercise, life style modification, education, and utilization of breathing techniques to increase stamina and strength and decrease shortness of breath with exertion.

## 2017-09-23 ENCOUNTER — Encounter (HOSPITAL_COMMUNITY)
Admission: RE | Admit: 2017-09-23 | Discharge: 2017-09-23 | Disposition: A | Discharge status: Home / Self Care | Payer: Medicare Other | Source: Ambulatory Visit | Attending: Internal Medicine | Admitting: Internal Medicine

## 2017-09-23 VITALS — Wt 276.2 lb

## 2017-09-23 DIAGNOSIS — Z7901 Long term (current) use of anticoagulants: Secondary | ICD-10-CM | POA: Diagnosis not present

## 2017-09-23 DIAGNOSIS — M1990 Unspecified osteoarthritis, unspecified site: Secondary | ICD-10-CM | POA: Diagnosis not present

## 2017-09-23 DIAGNOSIS — R0609 Other forms of dyspnea: Principal | ICD-10-CM

## 2017-09-23 DIAGNOSIS — Z950 Presence of cardiac pacemaker: Secondary | ICD-10-CM | POA: Diagnosis not present

## 2017-09-23 DIAGNOSIS — Z79899 Other long term (current) drug therapy: Secondary | ICD-10-CM | POA: Diagnosis not present

## 2017-09-23 DIAGNOSIS — Z7951 Long term (current) use of inhaled steroids: Secondary | ICD-10-CM | POA: Diagnosis not present

## 2017-09-23 NOTE — Progress Notes (Signed)
Daily Session Note  Patient Details  Name: [name] [name] MRN: [id number] Date of Birth: [birth year] Referring Provider:     Pulmonary Rehab Walk Test from 06/17/2017 in [place]  Referring Provider  Dr. Annamaria Boots      Encounter Date: 09/23/2017  Check In:     Session Check In - 09/23/17 1330      Check-In   Location [facility]   Staff Present Rosebud Poles, RN, BSN;Molly diVincenzo, MS, ACSM RCEP, Exercise Physiologist   Supervising physician immediately available to respond to emergencies [facility] immediately available   Physician(s) Dr. Horris Latino   Medication changes reported     No   Fall or balance concerns reported    No   Tobacco Cessation No Change   Warm-up and Cool-down Performed as group-led instruction   Resistance Training Performed Yes   VAD Patient? No     Pain Assessment   Currently in Pain? No/denies   Multiple Pain Sites No      Capillary Blood Glucose: No results found for this or any previous visit (from the past 24 hour(s)).      Exercise Prescription Changes - 09/23/17 1500      Response to Exercise   Blood Pressure (Admit) 126/84   Blood Pressure (Exercise) 104/60   Blood Pressure (Exit) 100/70   Heart Rate (Admit) 71 bpm   Heart Rate (Exercise) 94 bpm   Heart Rate (Exit) 64 bpm   Oxygen Saturation (Admit) 95 %   Oxygen Saturation (Exercise) 93 %   Oxygen Saturation (Exit) 95 %   Rating of Perceived Exertion (Exercise) 13   Perceived Dyspnea (Exercise) 3   Duration Progress to 45 minutes of aerobic exercise without signs/symptoms of physical distress   Intensity THRR unchanged     Progression   Progression Continue to progress workloads to maintain intensity without signs/symptoms of physical distress.     Resistance Training   Training Prescription Yes   Weight orange bands   Reps 10-15   Time 10 Minutes     Arm Ergometer   Level 6.5   Minutes 34     Track   Laps 13   Minutes 17      History  Smoking Status  . Former Smoker  . Packs/day: 2.00  . Years: 18.00  . Types: Cigarettes  Smokeless Tobacco  . Never Used    Comment: "quit smoking in 1971"    Goals Met:  Exercise tolerated well No report of cardiac concerns or symptoms Strength training completed today  Goals Unmet:  Not Applicable  Comments: Service time is from 1330 to 1500    Dr. Rush Farmer is Medical Director for Pulmonary Rehab at [facility].

## 2017-09-23 NOTE — Progress Notes (Signed)
[name] [name] 76 y.o. female  DOB: Apr 30, 1941 MRN: [id number]           Nutrition Note 1. Dyspnea on exertion   Spoke with pt. Pt had an appointment last week with Dr. Leafy Ro, a wt loss specialist. Pt reports she has not started the diet Dr. Leafy Ro gave her "yet." Barriers to starting the diet include pt's daughter is now under palliative care and "I'm making excuses to eat foods I won't have for a while." Pt encouraged to call Dr. Leafy Ro and request assistance with barriers. Pt expressed understanding of the information reviewed via feedback method.    Nutrition Diagnosis ? Food-and nutrition-related knowledge deficit related to lack of exposure to information as related to diagnosis of pulmonary disease ? 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 Identify food quantities necessary to achieve wt loss of  -2# per week to a goal wt loss of 2.7-10.9 kg (6-24 lb) at graduation from pulmonary rehab.  Nutrition Intervention ? ? If pt may benefit from a referral to a psychologist to help with wt loss barriers/coping with daughter's health. ? Pt's individual nutrition plan and goals reviewed with pt.  Plan:  Pt to attend Pulmonary Nutrition class - met; 07/24/17 Will provide client-centered nutrition education as part of interdisciplinary care.   Monitor and evaluate progress toward nutrition goal with team.  Monitor and Evaluate progress toward nutrition goal with team.   Derek Mound, M.Ed, RD, LDN, CDE 09/23/2017 2:39 PM

## 2017-09-25 ENCOUNTER — Telehealth (INDEPENDENT_AMBULATORY_CARE_PROVIDER_SITE_OTHER): Payer: Self-pay | Admitting: Family Medicine

## 2017-09-25 ENCOUNTER — Encounter (HOSPITAL_COMMUNITY)
Admission: RE | Admit: 2017-09-25 | Discharge: 2017-09-25 | Disposition: A | Discharge status: Home / Self Care | Payer: Medicare Other | Source: Ambulatory Visit | Attending: Internal Medicine | Admitting: Internal Medicine

## 2017-09-25 DIAGNOSIS — R0609 Other forms of dyspnea: Principal | ICD-10-CM

## 2017-09-25 NOTE — Progress Notes (Signed)
Incomplete Session Note  Patient Details  Name: [name] [name] MRN: [id number] Date of Birth: 1941/04/06 Referring Provider:     Pulmonary Rehab Walk Test from 06/17/2017 in [place]  Referring Provider  [name] did not complete her rehab session.  She stayed for the education session on oxygen safety, but had pallative care meeting her to discuss her daughter's care.

## 2017-09-26 ENCOUNTER — Encounter: Payer: Self-pay | Admitting: Cardiology

## 2017-09-30 ENCOUNTER — Encounter (HOSPITAL_COMMUNITY)
Admission: RE | Admit: 2017-09-30 | Discharge: 2017-09-30 | Disposition: A | Discharge status: Home / Self Care | Payer: Medicare Other | Source: Ambulatory Visit | Attending: Internal Medicine | Admitting: Internal Medicine

## 2017-09-30 ENCOUNTER — Other Ambulatory Visit: Payer: Self-pay | Admitting: Family Medicine

## 2017-09-30 VITALS — Wt 270.3 lb

## 2017-09-30 DIAGNOSIS — M1990 Unspecified osteoarthritis, unspecified site: Secondary | ICD-10-CM | POA: Diagnosis not present

## 2017-09-30 DIAGNOSIS — Z950 Presence of cardiac pacemaker: Secondary | ICD-10-CM | POA: Diagnosis not present

## 2017-09-30 DIAGNOSIS — Z7951 Long term (current) use of inhaled steroids: Secondary | ICD-10-CM | POA: Diagnosis not present

## 2017-09-30 DIAGNOSIS — Z79899 Other long term (current) drug therapy: Secondary | ICD-10-CM | POA: Diagnosis not present

## 2017-09-30 DIAGNOSIS — R0609 Other forms of dyspnea: Secondary | ICD-10-CM | POA: Diagnosis not present

## 2017-09-30 DIAGNOSIS — Z7901 Long term (current) use of anticoagulants: Secondary | ICD-10-CM | POA: Diagnosis not present

## 2017-09-30 NOTE — Progress Notes (Signed)
Daily Session Note  Patient Details  Name: [name] [name] MRN: [id number] Date of Birth: [birth year] Referring Provider:     Pulmonary Rehab Walk Test from 06/17/2017 in [place]  Referring Provider  Dr. Annamaria Boots      Encounter Date: 09/30/2017  Check In:     Session Check In - 09/30/17 1330      Check-In   Location [facility]   Staff Present Su Hilt, MS, ACSM RCEP, Exercise Physiologist;Oral Hallgren Leonia Reeves, RN, BSN   Supervising physician immediately available to respond to emergencies [facility] immediately available   Physician(s) Dr. Wendee Beavers   Medication changes reported     No   Fall or balance concerns reported    No   Tobacco Cessation No Change   Warm-up and Cool-down Performed as group-led instruction   Resistance Training Performed Yes   VAD Patient? No     Pain Assessment   Currently in Pain? No/denies   Multiple Pain Sites No      Capillary Blood Glucose: No results found for this or any previous visit (from the past 24 hour(s)).      Exercise Prescription Changes - 09/30/17 1500      Response to Exercise   Blood Pressure (Admit) 106/60   Blood Pressure (Exercise) 122/70   Blood Pressure (Exit) 102/66   Heart Rate (Admit) 68 bpm   Heart Rate (Exercise) 96 bpm   Heart Rate (Exit) 66 bpm   Oxygen Saturation (Admit) 96 %   Oxygen Saturation (Exercise) 94 %   Oxygen Saturation (Exit) 92 %   Rating of Perceived Exertion (Exercise) 15   Perceived Dyspnea (Exercise) 2   Duration Progress to 45 minutes of aerobic exercise without signs/symptoms of physical distress   Intensity THRR unchanged     Progression   Progression Continue to progress workloads to maintain intensity without signs/symptoms of physical distress.     Resistance Training   Training Prescription Yes   Weight orange bands   Reps 10-15   Time 10 Minutes     Interval Training   Interval Training No     NuStep   Level 6   Minutes 17   METs 2.3     Arm Ergometer   Level 6.5   Minutes 17     Track   Laps 11   Minutes 17      History  Smoking Status  . Former Smoker  . Packs/day: 2.00  . Years: 18.00  . Types: Cigarettes  Smokeless Tobacco  . Never Used    Comment: "quit smoking in 1971"    Goals Met:  Proper associated with RPD/PD & O2 Sat Strength training completed today  Goals Unmet:  Not Applicable  Comments: Service time is from 1330 to 1500    Dr. Rush Farmer is Medical Director for Pulmonary Rehab at [facility].

## 2017-10-01 ENCOUNTER — Ambulatory Visit (INDEPENDENT_AMBULATORY_CARE_PROVIDER_SITE_OTHER): Payer: Medicare Other | Admitting: Family Medicine

## 2017-10-01 VITALS — BP 117/74 | HR 67 | Temp 97.9°F | Ht 67.0 in | Wt 265.0 lb

## 2017-10-01 DIAGNOSIS — Z6841 Body Mass Index (BMI) 40.0 and over, adult: Secondary | ICD-10-CM

## 2017-10-01 DIAGNOSIS — R7303 Prediabetes: Secondary | ICD-10-CM

## 2017-10-01 DIAGNOSIS — N189 Chronic kidney disease, unspecified: Secondary | ICD-10-CM | POA: Diagnosis not present

## 2017-10-01 NOTE — Progress Notes (Signed)
Office: [phone]  /  Fax: [phone]   HPI:   Chief Complaint: OBESITY [name] [name] is here to discuss her progress with her obesity treatment plan. She is on the Category 2 plan +100 calories and is following her eating plan approximately 90 % of the time. She states she is exercising 0 minutes 0 times per week. [name] [name] did well with weight loss but she hasn't been able to follow her plan closely. Her daughter is now in hospice with terminal pulmonary failure from alpha-1-antitrypsin deficiency. She questions if this is a good time to work on weight loss. Her weight is 265 lb (120.2 kg) today and has had a weight loss of 2 pounds over a period of 2 weeks since her last visit. She has lost 2 lbs since starting treatment with [place].  Chronic Renal Insufficiency [name] [name] has a diagnosis of chronic renal insufficiency  With GFR slowly decreasing to 51. She has mild edema in her legs and denies shortness of breath. [name] [name] is on Lasix and is going to cardiac rehab for exercise.  Pre-Diabetes [name] [name] has a new diagnosis of pre-diabetes based on her elevated Hgb A1c at 6.2 and was informed this puts her at greater risk of developing diabetes. She notes eating a lot of simple carbohydrates and doing more comfort eating. She is not taking metformin currently and continues to work on diet and exercise to decrease risk of diabetes. She denies nausea or hypoglycemia.  ALLERGIES: Allergies  Allergen Reactions  . Ancef [Cefazolin] Hives and Itching  . Pseudoephedrine Hcl     Other reaction(s): palpitations (moderate)  . Caffeine Palpitations    MEDICATIONS: Current Outpatient Prescriptions on File Prior to Visit  Medication Sig Dispense Refill  . apixaban (ELIQUIS) 5 MG TABS tablet Take 1 tablet (5 mg total) by mouth 2 (two) times daily. 28 tablet 0  . Biotin 5000 MCG CAPS Take 5,000 mcg by mouth daily.     . Calcium Carbonate-Vit D-Min (CALCIUM 1200 PO) Take 2,400 mg by mouth daily.     .  cholecalciferol (VITAMIN D) 1000 UNITS tablet Take 1,000 Units by mouth daily.     Marland Kitchen Co-Enzyme Q10 100 MG CAPS Take 100 mg by mouth daily.     . diazepam (VALIUM) 5 MG tablet TAKE 1 TABLET BY MOUTH ONCE DAILY AS NEEDED FOR ANXIETY 30 tablet 1  . folic acid (FOLVITE) 976 MCG tablet Take 400 mcg by mouth daily.    Marland Kitchen glucosamine-chondroitin 500-400 MG tablet Take 1 tablet by mouth daily.     Marland Kitchen loratadine (KLS ALLERCLEAR) 10 MG tablet Take 10 mg by mouth daily.    Marland Kitchen MAGNESIUM CITRATE PO Take 250 mg by mouth daily. 1 tab daily    . metoprolol tartrate (LOPRESSOR) 25 MG tablet Take 0.5 tablets (12.5 mg total) by mouth 2 (two) times daily. 90 tablet 3  . nitroGLYCERIN (NITROSTAT) 0.4 MG SL tablet Place 0.4 mg under the tongue every 5 (five) minutes as needed for chest pain.    . pantoprazole (PROTONIX) 40 MG tablet TAKE 1 TABLET BY MOUTH DAILY 90 tablet 1  . Resveratrol 250 MG CAPS Take 250 mg by mouth daily.    . sertraline (ZOLOFT) 100 MG tablet Take 1 tablet (100 mg total) by mouth daily. 90 tablet 0  . Simethicone (GAS-X PO) Take 2 tablets by mouth daily as needed (bloating).     No current facility-administered medications on file prior to visit.     PAST MEDICAL HISTORY: Past Medical  History:  Diagnosis Date  . Alpha-1-antitrypsin deficiency carrier ([place])   . Arthritis    "all over"  . Atrial fibrillation ([place])   . Back pain   . Complication of anesthesia    "I woke up during 2 different procedures" (10/04/2014)  . Frequent UTI   . GERD (gastroesophageal reflux disease)   . Hypertension   . MVP (mitral valve prolapse)   . Obesity   . Osteoarthritis   . Presence of permanent cardiac pacemaker   . PVC's (premature ventricular contractions)   . Shortness of breath   . 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 Sleep apnea   . Stroke [facility]) 01/2014   denies deficits on 10/04/2014  . Tachy-brady syndrome ([place]) 10/2014  . TIA (transient  ischemic attack)     PAST SURGICAL HISTORY: Past Surgical History:  Procedure Laterality Date  . CARDIOVERSION N/A 09/02/2017   Procedure: CARDIOVERSION;  Surgeon: Sanda Klein, MD;  Location: [facility];  Service: Cardiovascular;  Laterality: N/A;  . EXCISION VAGINAL CYST     benign nodules  . INSERT / REPLACE / REMOVE PACEMAKER  10/04/2014   Medtronic Advisa model [id number] serial number [id number]  . LOOP RECORDER EXPLANT N/A 10/04/2014   Procedure: LOOP RECORDER EXPLANT;  Surgeon: Sanda Klein, MD;  Location: [place] CATH LAB;  Service: Cardiovascular;  Laterality: N/A;  . LOOP RECORDER IMPLANT N/A 04/19/2014   Procedure: LOOP RECORDER IMPLANT;  Surgeon: Sanda Klein, MD;  Location: [place] CATH LAB;  Service: Cardiovascular;  Laterality: N/A;  . NM MYOCAR PERF WALL MOTION  12/18/2007   normal  . PERMANENT PACEMAKER INSERTION N/A 10/04/2014   Procedure: PERMANENT PACEMAKER INSERTION;  Surgeon: Sanda Klein, MD;  Location: [place] CATH LAB;  Service: Cardiovascular;  Laterality: N/A;  . TUBAL LIGATION    . US ECHOCARDIOGRAPHY  05/15/2010   LA mildly dilated,mild mitral annular ca+, AOV mildly sclerotic, mild asymmetric LVH    SOCIAL HISTORY: Social History  Substance Use Topics  . Smoking status: Former Smoker    Packs/day: 2.00    Years: 18.00    Types: Cigarettes  . Smokeless tobacco: Never Used     Comment: "quit smoking in 1971"  . Alcohol use 0.0 oz/week     Comment: 10/04/2014 "might have a drink a couple times/yr"    FAMILY HISTORY: Family History  Problem Relation Age of Onset  . Heart attack Mother   . Sudden death Mother   . Depression Mother   . Stroke Father   . Heart failure Father   . Depression Father   . Heart failure Brother   . Stroke Brother     ROS: Review of Systems  Constitutional: Positive for weight loss.  Respiratory: Negative for shortness of breath.   Gastrointestinal: Negative for nausea.  Musculoskeletal:       Mild edema    Endo/Heme/Allergies:       Negative hypoglycemia    PHYSICAL EXAM: Blood pressure 117/74, pulse 67, temperature 97.9 F (36.6 C), temperature source Oral, height 5' 7" (1.702 m), weight 265 lb (120.2 kg), SpO2 98 %. Body mass index is 41.5 kg/m. Physical Exam  Constitutional: She is oriented to person, place, and time. She appears well-developed and well-nourished.  Cardiovascular: Normal rate.   Pulmonary/Chest: Effort normal.  Musculoskeletal: Normal range of motion.  Neurological: She is oriented to person, place, and time.  Skin: Skin is warm and dry.  Psychiatric: She has a normal mood and  affect. Her behavior is normal.  Vitals reviewed.   RECENT LABS AND TESTS: BMET    Component Value Date/Time   NA 139 09/17/2017 1131   K 4.2 09/17/2017 1131   CL 102 09/17/2017 1131   CO2 21 09/17/2017 1131   GLUCOSE 97 09/17/2017 1131   GLUCOSE 92 01/15/2017 1417   BUN 14 09/17/2017 1131   CREATININE 1.07 (H) 09/17/2017 1131   CREATININE 0.86 09/28/2014 1557   CALCIUM 8.9 09/17/2017 1131   GFRNONAA 51 (L) 09/17/2017 1131   GFRNONAA 56 (L) 06/06/2014 1653   GFRAA 58 (L) 09/17/2017 1131   GFRAA 65 06/06/2014 1653   Lab Results  Component Value Date   HGBA1C 6.2 (H) 09/17/2017   HGBA1C 5.9 (H) 03/09/2014   Lab Results  Component Value Date   INSULIN 47.7 (H) 09/17/2017   CBC    Component Value Date/Time   WBC 8.5 09/17/2017 1131   WBC 9.1 02/24/2017 1555   RBC 5.22 09/17/2017 1131   RBC 5.09 02/24/2017 1555   HGB 14.9 09/17/2017 1131   HCT 45.1 09/17/2017 1131   PLT 258 08/27/2017 1509   MCV 86 09/17/2017 1131   MCH 28.5 09/17/2017 1131   MCH 29.0 09/28/2014 1557   MCHC 33.0 09/17/2017 1131   MCHC 33.9 02/24/2017 1555   RDW 13.5 09/17/2017 1131   LYMPHSABS 2.0 09/17/2017 1131   MONOABS 1.3 (H) 02/24/2017 1555   EOSABS 0.2 09/17/2017 1131   BASOSABS 0.0 09/17/2017 1131   Iron/TIBC/Ferritin/ %Sat No results found for: IRON, TIBC, FERRITIN, IRONPCTSAT Lipid  Panel     Component Value Date/Time   CHOL 164 09/17/2017 1131   TRIG 117 09/17/2017 1131   HDL 47 09/17/2017 1131   CHOLHDL 3 01/15/2017 1417   VLDL 33.6 01/15/2017 1417   LDLCALC 94 09/17/2017 1131   LDLDIRECT 81.0 07/15/2016 1419   Hepatic Function Panel     Component Value Date/Time   PROT 6.8 09/17/2017 1131   ALBUMIN 3.8 09/17/2017 1131   AST 30 09/17/2017 1131   ALT 16 09/17/2017 1131   ALKPHOS 81 09/17/2017 1131   BILITOT 0.4 09/17/2017 1131   BILIDIR 0.1 01/15/2017 1417      Component Value Date/Time   TSH 2.040 09/17/2017 1131   TSH 2.70 07/15/2016 1419   TSH 1.529 06/06/2014 1608    ASSESSMENT AND PLAN: Chronic renal impairment, unspecified CKD stage  Prediabetes  Class 3 severe obesity with serious comorbidity and body mass index (BMI) of 40.0 to 44.9 in adult, unspecified obesity type ([place])  PLAN:  Chronic Renal Insufficiency Heath was encouraged to increase H2O intake and work on proper diet and weight loss to halt progression of renal disease.  Pre-Diabetes [name] [name] will continue to work on weight loss, exercise, and decreasing simple carbohydrates in her diet to help decrease the risk of diabetes. She was informed that eating too many simple carbohydrates or too many calories at one sitting increases the likelihood of GI side effects. We will defer metformin due to renal status and will recheck labs in 3 months. [name] [name] agreed to follow up with [place] as directed to monitor her progress.  We spent > than 50% of the 30 minute visit on the counseling as documented in the note.  Obesity [name] [name] is currently in the action stage of change. As such, her goal is to maintain weight for now She has agreed to portion control better and make smarter food choices, such as increase vegetables and decrease simple carbohydrates  [name] [name] has been instructed to work up to a goal of 150 minutes of combined cardio and strengthening exercise per week for weight loss and overall  health benefits. We discussed the following Behavioral Modification Strategies today: increase H2O intake, no skipping meals, planning for success, increasing lean protein intake, decreasing simple carbohydrates , increasing vegetables, decrease eating out, work on meal planning and easy cooking plans, holiday eating strategies  and emotional eating strategies  [name] [name] was advised that we will change her strategy to help her not gain weight while she is going through this difficult time and will get back to weight loss when she is ready.  [name] [name] has agreed to follow up with our clinic in 3 weeks. She was informed of the importance of frequent follow up visits to maximize her success with intensive lifestyle modifications for her multiple health conditions.  I, [name] [name], am acting as transcriptionist for Dennard Nip, MD  I have reviewed the above documentation for accuracy and completeness, and I agree with the above. -Dennard Nip, MD    OBESITY BEHAVIORAL INTERVENTION VISIT  Today's visit was # 2 out of 42.  Starting weight: 267 lbs Starting date: 09/17/17 Today's weight : 265 lbs Today's date: 10/01/2017 Total lbs lost to date: 2 (Patients must lose 7 lbs in the first 6 months to continue with counseling)   ASK: We discussed the diagnosis of obesity with [name] [name] today and [name] [name] agreed to give [place] permission to discuss obesity behavioral modification therapy today.  ASSESS: [name] [name] has the diagnosis of obesity and her BMI today is 41.5 [name] [name] is in the action stage of change   ADVISE: [name] [name] was educated on the multiple health risks of obesity as well as the benefit of weight loss to improve her health. She was advised of the need for long term treatment and the importance of lifestyle modifications.  AGREE: Multiple dietary modification options and treatment options were discussed and  [name] [name] agreed to portion control better and make smarter food choices, such as  increase vegetables and decrease simple carbohydrates  We discussed the following Behavioral Modification Strategies today: increase H2O intake, no skipping meals, planning for success, increasing lean protein intake, decreasing simple carbohydrates , increasing vegetables, decrease eating out, work on meal planning and easy cooking plans, holiday eating strategies  and emotional eating strategies

## 2017-10-01 NOTE — Telephone Encounter (Signed)
error

## 2017-10-02 ENCOUNTER — Encounter (HOSPITAL_COMMUNITY)
Admission: RE | Admit: 2017-10-02 | Discharge: 2017-10-02 | Disposition: A | Discharge status: Home / Self Care | Payer: Medicare Other | Source: Ambulatory Visit | Attending: Internal Medicine | Admitting: Internal Medicine

## 2017-10-02 VITALS — Wt 269.0 lb

## 2017-10-02 DIAGNOSIS — M1990 Unspecified osteoarthritis, unspecified site: Secondary | ICD-10-CM | POA: Insufficient documentation

## 2017-10-02 DIAGNOSIS — Z7951 Long term (current) use of inhaled steroids: Secondary | ICD-10-CM | POA: Insufficient documentation

## 2017-10-02 DIAGNOSIS — I495 Sick sinus syndrome: Secondary | ICD-10-CM | POA: Diagnosis not present

## 2017-10-02 DIAGNOSIS — Z950 Presence of cardiac pacemaker: Secondary | ICD-10-CM | POA: Insufficient documentation

## 2017-10-02 DIAGNOSIS — Z87891 Personal history of nicotine dependence: Secondary | ICD-10-CM | POA: Insufficient documentation

## 2017-10-02 DIAGNOSIS — Z6841 Body Mass Index (BMI) 40.0 and over, adult: Secondary | ICD-10-CM | POA: Insufficient documentation

## 2017-10-02 DIAGNOSIS — I4891 Unspecified atrial fibrillation: Secondary | ICD-10-CM | POA: Diagnosis not present

## 2017-10-02 DIAGNOSIS — Z8673 Personal history of transient ischemic attack (TIA), and cerebral infarction without residual deficits: Secondary | ICD-10-CM | POA: Diagnosis not present

## 2017-10-02 DIAGNOSIS — Z79899 Other long term (current) drug therapy: Secondary | ICD-10-CM | POA: Insufficient documentation

## 2017-10-02 DIAGNOSIS — K219 Gastro-esophageal reflux disease without esophagitis: Secondary | ICD-10-CM | POA: Diagnosis not present

## 2017-10-02 DIAGNOSIS — I10 Essential (primary) hypertension: Secondary | ICD-10-CM | POA: Diagnosis not present

## 2017-10-02 DIAGNOSIS — Z7901 Long term (current) use of anticoagulants: Secondary | ICD-10-CM | POA: Insufficient documentation

## 2017-10-02 DIAGNOSIS — I341 Nonrheumatic mitral (valve) prolapse: Secondary | ICD-10-CM | POA: Insufficient documentation

## 2017-10-02 DIAGNOSIS — R0609 Other forms of dyspnea: Secondary | ICD-10-CM | POA: Diagnosis not present

## 2017-10-02 DIAGNOSIS — E669 Obesity, unspecified: Secondary | ICD-10-CM | POA: Diagnosis not present

## 2017-10-02 DIAGNOSIS — Z23 Encounter for immunization: Secondary | ICD-10-CM | POA: Diagnosis not present

## 2017-10-02 NOTE — Progress Notes (Signed)
Daily Session Note  Patient Details  Name: [name] [name] MRN: [id number] Date of Birth: [birth year] Referring Provider:     Pulmonary Rehab Walk Test from 06/17/2017 in [place]  Referring Provider  Dr. Annamaria Boots      Encounter Date: 10/02/2017  Check In:     Session Check In - 10/02/17 1330      Check-In   Location [facility]   Staff Present Su Hilt, MS, ACSM RCEP, Exercise Physiologist;Joan Leonia Reeves, RN, Luisa Hart, RN, BSN   Supervising physician immediately available to respond to emergencies [facility] immediately available   Physician(s) Dr. Wynetta Emery   Medication changes reported     No   Fall or balance concerns reported    No   Tobacco Cessation No Change   Warm-up and Cool-down Performed as group-led instruction   Resistance Training Performed Yes   VAD Patient? No     Pain Assessment   Currently in Pain? No/denies   Multiple Pain Sites No      Capillary Blood Glucose: No results found for this or any previous visit (from the past 24 hour(s)).      Exercise Prescription Changes - 10/02/17 1613      Response to Exercise   Blood Pressure (Admit) 142/80   Blood Pressure (Exercise) 140/66   Blood Pressure (Exit) 124/80   Heart Rate (Admit) 87 bpm   Heart Rate (Exercise) 104 bpm   Heart Rate (Exit) 59 bpm   Oxygen Saturation (Admit) 92 %   Oxygen Saturation (Exercise) 95 %   Oxygen Saturation (Exit) 96 %   Rating of Perceived Exertion (Exercise) 13   Perceived Dyspnea (Exercise) 1   Duration Progress to 45 minutes of aerobic exercise without signs/symptoms of physical distress   Intensity THRR unchanged     Progression   Progression Continue to progress workloads to maintain intensity without signs/symptoms of physical distress.     Resistance Training   Training Prescription Yes   Weight orange bands   Reps 10-15   Time 10 Minutes     Interval Training   Interval Training No     NuStep   Level 6   Minutes 17   METs 2.1     Arm Ergometer   Level 5  workload reduced related to level 6.5 causes shoulder pain   Minutes 17      History  Smoking Status  . Former Smoker  . Packs/day: 2.00  . Years: 18.00  . Types: Cigarettes  Smokeless Tobacco  . Never Used    Comment: "quit smoking in 1971"    Goals Met:  Independence with exercise equipment Improved SOB with ADL's Using PLB without cueing & demonstrates good technique Exercise tolerated well No report of cardiac concerns or symptoms Strength training completed today  Goals Unmet:  Not Applicable  Comments: Service time is from 1330 to 1515   Dr. Rush Farmer is Medical Director for Pulmonary Rehab at [facility].

## 2017-10-04 MED ORDER — DIAZEPAM 5 MG PO TABS: ORAL_TABLET | ORAL | 1 refills | Status: DC

## 2017-10-04 NOTE — Telephone Encounter (Signed)
Call from [place] - pt is out of her valium. It seems that her daughter is dying and pt really needs her medication. Per [place] she last filled on 10/1 so she is due for RF.  I will refill but we let her know that it is out general policy not to refill controlled sub on the weekend, so she will be aware

## 2017-10-06 ENCOUNTER — Telehealth: Payer: Self-pay | Admitting: General Practice

## 2017-10-06 ENCOUNTER — Ambulatory Visit (INDEPENDENT_AMBULATORY_CARE_PROVIDER_SITE_OTHER): Payer: Medicare Other | Admitting: *Deleted

## 2017-10-06 DIAGNOSIS — I495 Sick sinus syndrome: Secondary | ICD-10-CM | POA: Diagnosis not present

## 2017-10-06 NOTE — Telephone Encounter (Signed)
This is just an [place].   [facility] Patient Name: AALIVIA MCGRAW Gender: Female DOB: 08/03/1941 Age: 76 Y 11 M 15 D Return Phone Number: [phone] (Primary) Address: City/State/Zip: [place] [address] Client [place] Client Site [place] Physician Dimple Nanas - MD Contact Type Call Who Is Calling Patient / Member / Family / Caregiver Call Type Triage / Clinical Relationship To Patient Self Return Phone Number [phone] (Primary) Chief Complaint Prescription Refill or Medication Request (non symptomatic) Reason for Call Medication Question / Request Initial Comment Caller needs a refill for her meds Translation No No Triage Reason Patient declined Nurse Assessment Nurse: Emilio Math, RN, Estill Bamberg Date/Time (Eastern Time): 10/04/2017 10:03:59 AM Confirm and document reason for call. If symptomatic, describe symptoms. ---Caller states she needs a refill on Diazepam. States daughter is at the end of her life, has needed more medication. Was recently filled. Has 2 pills left, will run out this weekend. Does the patient have any new or worsening symptoms? ---Yes Will a triage be completed? ---No Select reason for no triage. ---Patient declined Please document clinical information provided and list any resource used. ---Caller denies need for triage at this time, requesting refill on medication. Nurse: Emilio Math, RN, Estill Bamberg Date/Time (Eastern Time): 10/04/2017 10:15:44 AM Please select the assessment type ---Refill Additional Documentation ---Caller requesting refill on Diazepam. Does the patient have enough medication to last until the office opens? ---No Additional Documentation ---/ Guidelines Guideline Title Affirmed Question Affirmed Notes Nurse Date/Time ([place] Time) Disp. Time Eilene Ghazi Time)  Disposition Final User 10/04/2017 10:14:34 AM Called On-Call Provider [place], RN, Estill Bamberg 10/04/2017 10:16:47 AM Attempt made - message left Emilio Math, RN, Estill Bamberg PLEASE NOTE: All timestamps contained within this report are represented as [place] Standard Time. CONFIDENTIALTY NOTICE: This fax transmission is intended only for the addressee. It contains information that is legally privileged, confidential or otherwise protected from use or disclosure. If you are not the intended recipient, you are strictly prohibited from reviewing, disclosing, copying using or disseminating any of this information or taking any action in reliance on or regarding this information. If you have received this fax in error, please notify us immediately by telephone so that we can arrange for its return to [place]. Phone: [phone], Toll-Free: [phone], Fax: [phone] Page: 2 of 2 Call Id: [id number] [place]. Time Eilene Ghazi Time) Disposition Final User 10/04/2017 10:16:59 AM Send To RN Personal Emilio Math, RN, Estill Bamberg 10/04/2017 10:34:23 AM Attempt made - message left Emilio Math, RNEstill Bamberg 10/04/2017 10:34:34 AM Send To RN Personal Emilio Math, RN, Amanda 10/04/2017 10:41:47 AM Call Completed Emilio Math, RN, Estill Bamberg 10/04/2017 10:16:38 AM Clinical Call Yes Emilio Math, RN, Estill Bamberg Comments User: Richardean Sale, RN Date/Time Eilene Ghazi Time): 10/04/2017 10:41:39 AM Caller updated medication called to pharmacy, verbalized understanding. Paging DoctorName Phone DateTime Result/Outcome Message Type Notes Lamar Blinks MD [phone] 10/04/2017 10:14:34 AM Called On Call Provider - Reached Doctor Paged Lamar Blinks- MD 10/04/2017 10:15:32 AM Spoke with On Call - [place] with MD; states she will call medication to pharmacy. Also, per MD request, will notify caller controlled substances not refilled on weekends, but d/t circumstance will make an exception.

## 2017-10-07 LAB — CUP PACEART REMOTE DEVICE CHECK
Battery Remaining Longevity: 96 mo
Battery Voltage: 3.02 V
Brady Statistic AP VS Percent: 90.82 %
Brady Statistic AS VS Percent: 9.13 %
Brady Statistic RV Percent Paced: 0.06 %
Date Time Interrogation Session: 20181106003408
Implantable Lead Implant Date: 20151103
Implantable Lead Location: 753860
Implantable Pulse Generator Implant Date: 20151103
Lead Channel Impedance Value: 513 Ohm
Lead Channel Pacing Threshold Amplitude: 0.625 V
Lead Channel Sensing Intrinsic Amplitude: 4.625 mV
Lead Channel Sensing Intrinsic Amplitude: 4.625 mV
Lead Channel Sensing Intrinsic Amplitude: 7.375 mV
Lead Channel Setting Pacing Amplitude: 2 V
Lead Channel Setting Sensing Sensitivity: 2.8 mV
MDC IDC LEAD IMPLANT DT: 20151103
MDC IDC LEAD LOCATION: 753859
MDC IDC MSMT LEADCHNL RA IMPEDANCE VALUE: 437 Ohm
MDC IDC MSMT LEADCHNL RA IMPEDANCE VALUE: 494 Ohm
MDC IDC MSMT LEADCHNL RA PACING THRESHOLD PULSEWIDTH: 0.4 ms
MDC IDC MSMT LEADCHNL RV IMPEDANCE VALUE: 494 Ohm
MDC IDC MSMT LEADCHNL RV PACING THRESHOLD AMPLITUDE: 0.5 V
MDC IDC MSMT LEADCHNL RV PACING THRESHOLD PULSEWIDTH: 0.4 ms
MDC IDC MSMT LEADCHNL RV SENSING INTR AMPL: 7.375 mV
MDC IDC SET LEADCHNL RA PACING AMPLITUDE: 1.5 V
MDC IDC SET LEADCHNL RV PACING PULSEWIDTH: 0.4 ms
MDC IDC STAT BRADY AP VP PERCENT: 0.05 %
MDC IDC STAT BRADY AS VP PERCENT: 0 %
MDC IDC STAT BRADY RA PERCENT PACED: 90.48 %

## 2017-10-07 NOTE — Progress Notes (Signed)
Remote pacemaker transmission.   

## 2017-10-08 ENCOUNTER — Encounter: Payer: Self-pay | Admitting: Cardiology

## 2017-10-14 ENCOUNTER — Encounter (HOSPITAL_COMMUNITY)
Admission: RE | Admit: 2017-10-14 | Discharge: 2017-10-14 | Disposition: A | Discharge status: Home / Self Care | Payer: Medicare Other | Source: Ambulatory Visit

## 2017-10-16 ENCOUNTER — Encounter (HOSPITAL_COMMUNITY)
Admission: RE | Admit: 2017-10-16 | Discharge: 2017-10-16 | Disposition: A | Discharge status: Home / Self Care | Payer: Medicare Other | Source: Ambulatory Visit

## 2017-10-16 DIAGNOSIS — R0609 Other forms of dyspnea: Principal | ICD-10-CM

## 2017-10-16 NOTE — Progress Notes (Signed)
Pulmonary Individual Treatment Plan  Patient Details  Name: [name] [name] MRN: [id number] Date of Birth: 02/03/1941 Referring Provider:     Pulmonary Rehab Walk Test from 06/17/2017 in [place]  Referring Provider  Dr. Annamaria Boots      Initial Encounter Date:    Pulmonary Rehab Walk Test from 06/17/2017 in [place]  Date  06/19/17  Referring Provider  Dr. Annamaria Boots      Visit Diagnosis: Dyspnea on exertion  Patient's Home Medications on Admission:   Current Outpatient Medications:  .  apixaban (ELIQUIS) 5 MG TABS tablet, Take 1 tablet (5 mg total) by mouth 2 (two) times daily., Disp: 28 tablet, Rfl: 0 .  Biotin 5000 MCG CAPS, Take 5,000 mcg by mouth daily. , Disp: , Rfl:  .  Calcium Carbonate-Vit D-Min (CALCIUM 1200 PO), Take 2,400 mg by mouth daily. , Disp: , Rfl:  .  cholecalciferol (VITAMIN D) 1000 UNITS tablet, Take 1,000 Units by mouth daily. , Disp: , Rfl:  .  Co-Enzyme Q10 100 MG CAPS, Take 100 mg by mouth daily. , Disp: , Rfl:  .  diazepam (VALIUM) 5 MG tablet, TAKE 1 TABLET BY MOUTH ONCE DAILY AS NEEDED FOR ANXIETY, Disp: 30 tablet, Rfl: 1 .  folic acid (FOLVITE) 403 MCG tablet, Take 400 mcg by mouth daily., Disp: , Rfl:  .  glucosamine-chondroitin 500-400 MG tablet, Take 1 tablet by mouth daily. , Disp: , Rfl:  .  loratadine (KLS ALLERCLEAR) 10 MG tablet, Take 10 mg by mouth daily., Disp: , Rfl:  .  MAGNESIUM CITRATE PO, Take 250 mg by mouth daily. 1 tab daily, Disp: , Rfl:  .  metoprolol tartrate (LOPRESSOR) 25 MG tablet, Take 0.5 tablets (12.5 mg total) by mouth 2 (two) times daily., Disp: 90 tablet, Rfl: 3 .  nitroGLYCERIN (NITROSTAT) 0.4 MG SL tablet, Place 0.4 mg under the tongue every 5 (five) minutes as needed for chest pain., Disp: , Rfl:  .  pantoprazole (PROTONIX) 40 MG tablet, TAKE 1 TABLET BY MOUTH DAILY, Disp: 90 tablet, Rfl: 1 .  Resveratrol 250 MG CAPS, Take 250 mg by mouth daily., Disp: , Rfl:  .   sertraline (ZOLOFT) 100 MG tablet, Take 1 tablet (100 mg total) by mouth daily., Disp: 90 tablet, Rfl: 0 .  Simethicone (GAS-X PO), Take 2 tablets by mouth daily as needed (bloating)., Disp: , Rfl:   Past Medical History: Past Medical History:  Diagnosis Date  . Alpha-1-antitrypsin deficiency carrier ([place])   . Arthritis    "all over"  . Atrial fibrillation ([place])   . Back pain   . Complication of anesthesia    "I woke up during 2 different procedures" (10/04/2014)  . Frequent UTI   . GERD (gastroesophageal reflux disease)   . Hypertension   . MVP (mitral valve prolapse)   . Obesity   . Osteoarthritis   . Presence of permanent cardiac pacemaker   . PVC's (premature ventricular contractions)   . Shortness of breath   . 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 Sleep apnea   . Stroke [facility]) 01/2014   denies deficits on 10/04/2014  . Tachy-brady syndrome ([place]) 10/2014  . TIA (transient ischemic attack)     Tobacco Use: Social History   Tobacco Use  Smoking Status Former Smoker  . Packs/day: 2.00  . Years: 18.00  . Pack years: 36.00  . Types: Cigarettes  Smokeless  Tobacco Never Used  Tobacco Comment   "quit smoking in 1971"    Labs: Recent Review Flowsheet Data    Labs for ITP Cardiac and Pulmonary Rehab Latest Ref Rng & Units 03/09/2014 10/25/2014 07/15/2016 01/15/2017 09/17/2017   Cholestrol 100 - 199 mg/dL 152 184 163 182 164   LDLCALC 0 - 99 mg/dL 77 138 - 94 94   LDLDIRECT mg/dL - - 81.0 - -   HDL >39 mg/dL 58 46 45.70 54.60 47   Trlycerides 0 - 149 mg/dL 87 210(A) 208.0(H) 168.0(H) 117   Hemoglobin A1c 4.8 - 5.6 % 5.9(H) - - - 6.2(H)   TCO2 0 - 100 mmol/L - - - - -      Capillary Blood Glucose: Lab Results  Component Value Date   GLUCAP 103 (H) 03/09/2014   GLUCAP 103 (H) 03/09/2014   GLUCAP 112 (H) 03/09/2014   POCT Glucose    Row Name 07/15/17 1555             POCT Blood Glucose   Pre-Exercise  220 mg/dL        Post-Exercise  165 mg/dL          Pulmonary Assessment Scores: Pulmonary Assessment Scores    Row Name 06/19/17 0728         ADL UCSD   ADL Phase  Entry       mMRC Score   mMRC Score  4        Pulmonary Function Assessment: Pulmonary Function Assessment - 06/09/17 1315      Breath   Bilateral Breath Sounds  Clear    Shortness of Breath  Yes;Limiting activity;Fear of Shortness of Breath       Exercise Target Goals:    Exercise Program Goal: Individual exercise prescription set with THRR, safety & activity barriers. Participant demonstrates ability to understand and report RPE using BORG scale, to self-measure pulse accurately, and to acknowledge the importance of the exercise prescription.  Exercise Prescription Goal: Starting with aerobic activity 30 plus minutes a day, 3 days per week for initial exercise prescription. Provide home exercise prescription and guidelines that participant acknowledges understanding prior to discharge.  Activity Barriers & Risk Stratification:   6 Minute Walk: 6 Minute Walk    Row Name 06/19/17 0723         6 Minute Walk   Phase  Initial     Distance  800 feet     Walk Time  - 4 minutes 50 seconds total     # of Rest Breaks  2 1st: 40 seconds 2nd: 30 seconds     MPH  1.5     METS  2.15     RPE  13     Perceived Dyspnea   3     Symptoms  Yes (comment)     Comments  used wheelchair/5/10 hip pain/limited by shortness of breath     Resting HR  90 bpm     Resting BP  105/66     Max Ex. HR  127 bpm     Max Ex. BP  105/65       Interval HR   Baseline HR (retired)  90     1 Minute HR  144     2 Minute HR  127     3 Minute HR  102     4 Minute HR  98     5 Minute HR  98     6 Minute  HR  102     2 Minute Post HR  95     Interval Heart Rate?  Yes       Interval Oxygen   Interval Oxygen?  Yes     Baseline Oxygen Saturation %  94 %     Resting Liters of Oxygen  0 L     1 Minute Oxygen Saturation %  93 %     1 Minute  Liters of Oxygen  0 L     2 Minute Oxygen Saturation %  93 %     2 Minute Liters of Oxygen  0 L     3 Minute Oxygen Saturation %  93 %     3 Minute Liters of Oxygen  0 L     4 Minute Oxygen Saturation %  94 %     4 Minute Liters of Oxygen  0 L     5 Minute Oxygen Saturation %  92 %     5 Minute Liters of Oxygen  0 L     6 Minute Oxygen Saturation %  92 %     6 Minute Liters of Oxygen  0 L     2 Minute Post Oxygen Saturation %  93 %     2 Minute Post Liters of Oxygen  0 L        Oxygen Initial Assessment: Oxygen Initial Assessment - 06/19/17 0726      Initial 6 min Walk   Oxygen Used  None    Resting Oxygen Saturation   94 %    Exercise Oxygen Saturation  during 6 min walk  92 %      Program Oxygen Prescription   Program Oxygen Prescription  None       Oxygen Re-Evaluation: Oxygen Re-Evaluation    Row Name 07/01/17 1924 07/30/17 2103 08/22/17 1357 09/22/17 0834 10/14/17 1543     Program Oxygen Prescription   Program Oxygen Prescription  None  None  None  None  None     Home Oxygen   Home Oxygen Device  None  None  None  None  None   Sleep Oxygen Prescription  CPAP  CPAP  CPAP  CPAP  CPAP   Liters per minute  0  -  -  0  -   Home Exercise Oxygen Prescription  None  None  None  None  None   Home at Rest Exercise Oxygen Prescription  None  None  None  None  None   Compliance with Home Oxygen Use  Yes  -  -  Yes  Yes      Oxygen Discharge (Final Oxygen Re-Evaluation): Oxygen Re-Evaluation - 10/14/17 1543      Program Oxygen Prescription   Program Oxygen Prescription  None      Home Oxygen   Home Oxygen Device  None    Sleep Oxygen Prescription  CPAP    Home Exercise Oxygen Prescription  None    Home at Rest Exercise Oxygen Prescription  None    Compliance with Home Oxygen Use  Yes       Initial Exercise Prescription: Initial Exercise Prescription - 06/19/17 0700      Date of Initial Exercise RX and Referring Provider   Date  06/19/17    Referring Provider   Dr. Annamaria Boots      Recumbant Bike   Level  1    Minutes  17      NuStep   Level  2    Minutes  17    METs  1.5      Track   Laps  4    Minutes  17      Prescription Details   Frequency (times per week)  2    Duration  Progress to 45 minutes of aerobic exercise without signs/symptoms of physical distress      Intensity   THRR 40-80% of Max Heartrate  58-115    Ratings of Perceived Exertion  11-13    Perceived Dyspnea  0-4      Progression   Progression  Continue progressive overload as per policy without signs/symptoms or physical distress.      Resistance Training   Training Prescription  Yes    Weight  orange bands    Reps  10-15       Perform Capillary Blood Glucose checks as needed.  Exercise Prescription Changes: Exercise Prescription Changes    Row Name 06/24/17 1500 06/26/17 1500 07/01/17 1500 07/03/17 1500 07/08/17 1500     Response to Exercise   Blood Pressure (Admit)  104/60  120/60  100/56  96/60  110/64   Blood Pressure (Exercise)  124/70  130/84  122/82  130/86  118/70   Blood Pressure (Exit)  100/60  108/74  104/60  126/60  108/78   Heart Rate (Admit)  79 bpm  78 bpm  97 bpm  79 bpm  72 bpm   Heart Rate (Exercise)  105 bpm  94 bpm  107 bpm  105 bpm  94 bpm   Heart Rate (Exit)  83 bpm  70 bpm  79 bpm  71 bpm  76 bpm   Oxygen Saturation (Admit)  94 %  94 %  96 %  96 %  94 %   Oxygen Saturation (Exercise)  94 %  93 %  93 %  87 % Sat increased to 92% with rest  93 % Sat increased to 92% with rest   Oxygen Saturation (Exit)  93 %  95 %  94 %  93 %  96 %   Rating of Perceived Exertion (Exercise)  _0 Perceived Dyspnea (Exercise)  _1 Duration  Progress to 45 minutes of aerobic exercise without signs/symptoms of physical distress  Progress to 45 minutes of aerobic exercise without signs/symptoms of physical distress  Progress to 45 minutes of aerobic exercise without signs/symptoms of physical distress  Progress to 45 minutes of  aerobic exercise without signs/symptoms of physical distress  Progress to 45 minutes of aerobic exercise without signs/symptoms of physical distress   Intensity  Other (comment) 40-8-% of HRR  THRR unchanged  THRR unchanged  THRR unchanged  THRR unchanged     Progression   Progression  Continue to progress workloads to maintain intensity without signs/symptoms of physical distress.  Continue to progress workloads to maintain intensity without signs/symptoms of physical distress.  Continue to progress workloads to maintain intensity without signs/symptoms of physical distress.  Continue to progress workloads to maintain intensity without signs/symptoms of physical distress.  Continue to progress workloads to maintain intensity without signs/symptoms of physical distress.     Resistance Training   Training Prescription  Yes  Yes  Yes  Yes  Yes   Weight  orange bands  orange bands  orange bands  orange bands  orange bands   Reps  10-15  10-15  10-15  10-15  10-15   Time  10 Minutes  10 Minutes  10 Minutes  10 Minutes  10 Minutes     NuStep   Level  _0 -  2   Minutes  _1 -  17   METs  1.9  1.1  1.9  -  1.8     Arm Ergometer   Level  _2 Minutes  _3 Track   Laps  5  -  _4 Minutes  17  -  _5 Row Name 07/10/17 1500 07/15/17 1500 07/17/17 1500 07/24/17 1605 07/31/17 1600     Response to Exercise   Blood Pressure (Admit)  96/64  94/60  90/50  104/70  110/62   Blood Pressure (Exercise)  112/86  110/74  98/50  108/64  114/60   Blood Pressure (Exit)  100/60  100/64  100/60  104/60  98/0   Heart Rate (Admit)  75 bpm  70 bpm  80 bpm  74 bpm  80 bpm   Heart Rate (Exercise)  88 bpm  77 bpm  94 bpm  88 bpm  88 bpm   Heart Rate (Exit)  74 bpm  92 bpm  75 bpm  86 bpm  83 bpm   Oxygen Saturation (Admit)  96 %  93 %  96 %  93 %  94 %   Oxygen Saturation (Exercise)  95 %  94 %  90 %  94 %  95 %   Oxygen Saturation (Exit)  96 %  96 %  95 %   96 %  93 %   Rating of Perceived Exertion (Exercise)  _6 Perceived Dyspnea (Exercise)  _7 Duration  Progress to 45 minutes of aerobic exercise without signs/symptoms of physical distress  Progress to 45 minutes of aerobic exercise without signs/symptoms of physical distress  Progress to 45 minutes of aerobic exercise without signs/symptoms of physical distress  Progress to 45 minutes of aerobic exercise without signs/symptoms of physical distress  Progress to 45 minutes of aerobic exercise without signs/symptoms of physical distress   Intensity  THRR unchanged  THRR unchanged  THRR unchanged  THRR unchanged  THRR unchanged     Progression   Progression  Continue to progress workloads to maintain intensity without signs/symptoms of physical distress.  Continue to progress workloads to maintain intensity without signs/symptoms of physical distress.  Continue to progress workloads to maintain intensity without signs/symptoms of physical distress.  Continue to progress workloads to maintain intensity without signs/symptoms of physical distress.  Continue to progress workloads to maintain intensity without signs/symptoms of physical distress.     Resistance Training   Training Prescription  Yes  Yes  Yes  Yes  Yes   Weight  orange bands  orange bands  orange bands  orange bands  orange bands   Reps  10-15  10-15  10-15  10-15  10-15   Time  10 Minutes  10 Minutes  10 Minutes  10 Minutes  10 Minutes     NuStep   Level  3  4  -  4  5   Minutes  17  17  -  17  17   METs  2.4  2  -  1.4  1.7     Arm Ergometer   Level  3  3  -  5  6   Minutes  17  17  -  17  17   Row Name 08/05/17 1500 08/12/17 1559 08/14/17 1500 08/14/17 1546 08/19/17 1555     Response to Exercise   Blood Pressure (Admit)  100/60  100/62  -  98/62  98/62   Blood Pressure (Exercise)  122/66  104/62  -  98/56  97/67   Blood Pressure (Exit)  110/60  104/64  -  100/54  114/74   Heart Rate (Admit)  76  bpm  70 bpm  -  77 bpm  68 bpm   Heart Rate (Exercise)  94 bpm  99 bpm  -  86 bpm  100 bpm   Heart Rate (Exit)  65 bpm  70 bpm  -  82 bpm  90 bpm   Oxygen Saturation (Admit)  96 %  95 %  -  95 %  94 %   Oxygen Saturation (Exercise)  94 %  96 %  -  95 %  94 %   Oxygen Saturation (Exit)  91 %  95 %  -  96 %  94 %   Rating of Perceived Exertion (Exercise)  14  13  -  12  14   Perceived Dyspnea (Exercise)  3  3  -  2  3   Duration  Progress to 45 minutes of aerobic exercise without signs/symptoms of physical distress  Progress to 45 minutes of aerobic exercise without signs/symptoms of physical distress  -  Progress to 45 minutes of aerobic exercise without signs/symptoms of physical distress  Progress to 45 minutes of aerobic exercise without signs/symptoms of physical distress   Intensity  THRR unchanged  THRR unchanged  -  THRR unchanged  THRR unchanged     Progression   Progression  Continue to progress workloads to maintain intensity without signs/symptoms of physical distress.  Continue to progress workloads to maintain intensity without signs/symptoms of physical distress.  -  Continue to progress workloads to maintain intensity without signs/symptoms of physical distress.  Continue to progress workloads to maintain intensity without signs/symptoms of physical distress.     Resistance Training   Training Prescription  Yes  Yes  -  Yes  Yes   Weight  orange bands  orange bands  -  orange bands  orange bands   Reps  10-15  10-15  -  10-15  10-15   Time  10 Minutes  10 Minutes  -  10 Minutes  10 Minutes     NuStep   Level  5  5  -  5  5   Minutes  17  17  -  51  17   METs  2.2  1.9  -  2  2.1     Arm Ergometer   Level  6.5  6.5  -  -  6   Minutes  17  17  -  -  17     Track   Laps  -  -  -  -  8   Minutes  -  -  -  -  17     Home Exercise Plan   Plans to continue exercise at  [facility] (comment) water treading at  [employer] and walking at home  -  -   Frequency  -  -  Add  2 additional days to program exercise sessions.  -  -   Row Name 08/21/17 1600 08/26/17 1500 08/28/17 1500 09/09/17 1500 09/16/17 1529     Response to Exercise   Blood Pressure (Admit)  92/68  104/64  100/60  96/64  144/64   Blood Pressure (Exercise)  112/62  110/60  130/74  110/66  120/74   Blood Pressure (Exit)  100/80  118/62  98/64  104/60  126/60   Heart Rate (Admit)  80 bpm  83 bpm  71 bpm  64 bpm  66 bpm   Heart Rate (Exercise)  98 bpm  110 bpm  88 bpm  94 bpm  88 bpm   Heart Rate (Exit)  86 bpm  78 bpm  69 bpm  87 bpm  61 bpm   Oxygen Saturation (Admit)  96 %  95 %  96 %  96 %  95 %   Oxygen Saturation (Exercise)  95 %  93 %  95 %  93 %  94 %   Oxygen Saturation (Exit)  96 %  95 %  94 %  93 %  91 %   Rating of Perceived Exertion (Exercise)  _0 Perceived Dyspnea (Exercise)  _1 Duration  Progress to 45 minutes of aerobic exercise without signs/symptoms of physical distress  Progress to 45 minutes of aerobic exercise without signs/symptoms of physical distress  Progress to 45 minutes of aerobic exercise without signs/symptoms of physical distress  Progress to 45 minutes of aerobic exercise without signs/symptoms of physical distress  Progress to 45 minutes of aerobic exercise without signs/symptoms of physical distress   Intensity  THRR unchanged  THRR unchanged  THRR unchanged  THRR unchanged  THRR unchanged     Progression   Progression  Continue to progress workloads to maintain intensity without signs/symptoms of physical distress.  Continue to progress workloads to maintain intensity without signs/symptoms of physical distress.  Continue to progress workloads to maintain intensity without signs/symptoms of physical distress.  Continue to progress workloads to maintain intensity without signs/symptoms of physical distress.  Continue to progress workloads to maintain intensity without signs/symptoms of physical distress.     Resistance Training   Training  Prescription  Yes  Yes  Yes  Yes  Yes   Weight  orange bands  orange bands  orange bands  orange bands  orange bands   Reps  10-15  10-15  10-15  10-15  10-15   Time  10 Minutes  10 Minutes  10 Minutes  10 Minutes  10 Minutes     NuStep   Level  _2 Minutes  _3 34   METs  1.2  2.3  2  2.2  2.2     Arm Ergometer   Level  6  6  6.5  -  -   Minutes  _4 -  -     Track   Laps  -  9  -  12  12   Minutes  -  [place] Name 09/23/17 1500 09/30/17 1500 10/02/17 1613  Response to Exercise   Blood Pressure (Admit)  126/84  106/60  142/80     Blood Pressure (Exercise)  104/60  122/70  140/66     Blood Pressure (Exit)  100/70  102/66  124/80     Heart Rate (Admit)  71 bpm  68 bpm  87 bpm     Heart Rate (Exercise)  94 bpm  96 bpm  104 bpm     Heart Rate (Exit)  64 bpm  66 bpm  59 bpm     Oxygen Saturation (Admit)  95 %  96 %  92 %     Oxygen Saturation (Exercise)  93 %  94 %  95 %     Oxygen Saturation (Exit)  95 %  92 %  96 %     Rating of Perceived Exertion (Exercise)  _0 Perceived Dyspnea (Exercise)  _1 Duration  Progress to 45 minutes of aerobic exercise without signs/symptoms of physical distress  Progress to 45 minutes of aerobic exercise without signs/symptoms of physical distress  Progress to 45 minutes of aerobic exercise without signs/symptoms of physical distress     Intensity  THRR unchanged  THRR unchanged  THRR unchanged       Progression   Progression  Continue to progress workloads to maintain intensity without signs/symptoms of physical distress.  Continue to progress workloads to maintain intensity without signs/symptoms of physical distress.  Continue to progress workloads to maintain intensity without signs/symptoms of physical distress.       Resistance Training   Training Prescription  Yes  Yes  Yes     Weight  orange bands  orange bands  orange bands     Reps  10-15  10-15  10-15     Time  10  Minutes  10 Minutes  10 Minutes       Interval Training   Interval Training  -  No  No       NuStep   Level  -  6  6     Minutes  -  17  17     METs  -  2.3  2.1       Arm Ergometer   Level  6.5  6.5  5 workload reduced related to level 6.5 causes shoulder pain     Minutes  34  17  [place]  13  11  -     Minutes  17  17  -        Exercise Comments: Exercise Comments    Row Name 08/14/17 1530           Exercise Comments  home exercise completed          Exercise Goals and Review:   Exercise Goals Re-Evaluation : Exercise Goals Re-Evaluation    [place] Name 06/27/17 1042 07/29/17 1005 08/18/17 1402 09/20/17 1337 10/13/17 1638     Exercise Goal Re-Evaluation   Exercise Goals Review  Increase Physical Activity;Increase Strenth and Stamina  Increase Physical Activity;Increase Strength and Stamina;Able to understand and use Dyspnea scale;Able to understand and use rate of perceived exertion (RPE) scale;Knowledge and understanding of Target Heart Rate Range (THRR);Understanding of Exercise Prescription  Increase Strength and Stamina;Increase Physical Activity;Able to understand and use Dyspnea scale;Able to understand and use rate of perceived exertion (RPE) scale;Knowledge and understanding of Target  Heart Rate Range (THRR);Understanding of Exercise Prescription  Increase Physical Activity;Increase Strength and Stamina;Able to understand and use Dyspnea scale;Able to understand and use rate of perceived exertion (RPE) scale;Knowledge and understanding of Target Heart Rate Range (THRR);Understanding of Exercise Prescription  Increase Strength and Stamina;Increase Physical Activity;Able to understand and use rate of perceived exertion (RPE) scale;Knowledge and understanding of Target Heart Rate Range (THRR);Understanding of Exercise Prescription;Able to understand and use Dyspnea scale   Comments  Patient has only attended two exercise sessions. Will cont. to monitor and  progress as able.   Patient is progressing slowly in program. She is limited by orthopedic issues. Will cont. to progress as able.  Patient is doing well in program. She is showing new initiative to push herself. Home exercise completed. Hesitant to exercise at home. Will cont. to monitor and motivate patient.   Patient has been progressing well. Mets still range in "low" level. Works hard while she is here. Patient has missed a few sessions recently because her daughter is in the hospital.  Patient is motivated to work hard and make improvements. Daughter has just past away. Wants to cont. to come to rehab. We have extended her to 36 sessions. Will cont. to monitor and progress as able.    Expected Outcomes  Through exercising at rehab and at home, patient will increase physical strength and stamina and find ADL's easier to perform.   Through exercising at rehab and at home, patient will increase physical strength and stamina and find ADL's easier to perform.   Through exercising at rehab and at home, patient will increase physical strength and stamina and find ADL's easier to perform.   Through exercising at rehab and at home, patient will increase physical strength and stamina and find ADL's easier to perform.   Through exercise at rehab and at home, patient will increase strength and stamina and will find that ADL's are easier to preform.       Discharge Exercise Prescription (Final Exercise Prescription Changes): Exercise Prescription Changes - 10/02/17 1613      Response to Exercise   Blood Pressure (Admit)  142/80    Blood Pressure (Exercise)  140/66    Blood Pressure (Exit)  124/80    Heart Rate (Admit)  87 bpm    Heart Rate (Exercise)  104 bpm    Heart Rate (Exit)  59 bpm    Oxygen Saturation (Admit)  92 %    Oxygen Saturation (Exercise)  95 %    Oxygen Saturation (Exit)  96 %    Rating of Perceived Exertion (Exercise)  13    Perceived Dyspnea (Exercise)  1    Duration  Progress to 45  minutes of aerobic exercise without signs/symptoms of physical distress    Intensity  THRR unchanged      Progression   Progression  Continue to progress workloads to maintain intensity without signs/symptoms of physical distress.      Resistance Training   Training Prescription  Yes    Weight  orange bands    Reps  10-15    Time  10 Minutes      Interval Training   Interval Training  No      NuStep   Level  6    Minutes  17    METs  2.1      Arm Ergometer   Level  5 workload reduced related to level 6.5 causes shoulder pain    Minutes  17  Nutrition:  Target Goals: Understanding of nutrition guidelines, daily intake of sodium <1585m, cholesterol <2069m calories 30% from fat and 7% or less from saturated fats, daily to have 5 or more servings of fruits and vegetables.  Biometrics:    Nutrition Therapy Plan and Nutrition Goals: Nutrition Therapy & Goals - 06/19/17 0842      Nutrition Therapy   Diet  General, Healthful      Personal Nutrition Goals   Nutrition Goal  Identify food quantities necessary to achieve wt loss of  -2# per week to a goal wt loss of 2.7-10.9 kg (6-24 lb) at graduation from pulmonary rehab.      Intervention Plan   Intervention  Prescribe, educate and counsel regarding individualized specific dietary modifications aiming towards targeted core components such as weight, hypertension, lipid management, diabetes, heart failure and other comorbidities.    Expected Outcomes  Short Term Goal: Understand basic principles of dietary content, such as calories, fat, sodium, cholesterol and nutrients.;Long Term Goal: Adherence to prescribed nutrition plan.       Nutrition Discharge: Rate Your Plate Scores: Nutrition Assessments - 06/19/17 0836      Rate Your Plate Scores   Pre Score  46       Nutrition Goals Re-Evaluation:   Nutrition Goals Discharge (Final Nutrition Goals Re-Evaluation):   Psychosocial: Target Goals: Acknowledge  presence or absence of significant depression and/or stress, maximize coping skills, provide positive support system. Participant is able to verbalize types and ability to use techniques and skills needed for reducing stress and depression.  Initial Review & Psychosocial Screening: Initial Psych Review & Screening - 06/09/17 1328      Initial Review   Current issues with  None Identified      Family Dynamics   Good Support System?  Yes      Barriers   Psychosocial barriers to participate in program  There are no identifiable barriers or psychosocial needs.      Screening Interventions   Interventions  Encouraged to exercise       Quality of Life Scores:   PHQ-9: Recent Review Flowsheet Data    [facility] 09/17[facility]2017 06/09/2017 01/15/2017 07/15/2016   Decreased Interest 2 3 0 0   Down, Depressed, Hopeless 2 - 0  0   PHQ - 2 Score 4 3 0 0   Altered sleeping 0 0 - -   Tired, decreased energy 3 3 - -   Change in appetite 3 3 - -   Feeling bad or failure about yourself  2 3 - -   Trouble concentrating 3 0 - -   Moving slowly or fidgety/restless 0 0 - -   Suicidal thoughts 0 0 - -   PHQ-9 Score 15 12 - -   Difficult doing work/chores Very difficult Not difficult at all - -     Interpretation of Total Score  Total Score Depression Severity:  1-4 = Minimal depression, 5-9 = Mild depression, 10-14 = Moderate depression, 15-19 = Moderately severe depression, 20-27 = Severe depression   Psychosocial Evaluation and Intervention: Psychosocial Evaluation - 06/09/17 1330      Psychosocial Evaluation & Interventions   Interventions  Encouraged to exercise with the program and follow exercise prescription    Comments  dealing depression, but is working on making herself happy.    Continue Psychosocial Services   No Follow up required       Psychosocial Re-Evaluation: Psychosocial Re-Evaluation    [place] 07/01/17  1926 07/30/17 2105 08/22/17 1357 09/22/17 0836 10/14/17  1545     Psychosocial Re-Evaluation   Current issues with  None Identified  None Identified  Current Stress Concerns  Current Stress Concerns  Current Stress Concerns her daughter died after a long illness   Comments  -  -  patient worries about dauther who has terminal pulmonary disease and requires high flow oxygen. She worries that she is unable to help her daughter because of her own shortness of breath and deconditioning.   patient worries about dauther who has terminal pulmonary disease and requires high flow oxygen. She worries that she is unable to help her daughter because of her own shortness of breath and deconditioning.  her daughter has been hospitalized over the last week since she ran out of oxygen from the huricaine and the patient has not been at her rehab sessions.   She has not returned to the program since her daughter died, unable to evaluate psychosocial issue(s)   Expected Outcomes  -  patient will remain free from psychosocial barriers to participation  patient will remain free from psychosocial barriers to participation  patient will remain free from psychosocial barriers to participation  patient will remain free from psychosocial barriers to participation   Interventions  -  Encouraged to attend Pulmonary Rehabilitation for the exercise  Encouraged to attend Pulmonary Rehabilitation for the exercise  Encouraged to attend Pulmonary Rehabilitation for the exercise  Encouraged to attend Pulmonary Rehabilitation for the exercise   Continue Psychosocial Services   -  No Follow up required  No Follow up required  Follow up required by staff  Follow up required by staff   Comments  -  -  daughters terminal pulmonary illness  daughters terminal pulmonary illness  now she is dealing with her daughter's death     Initial Review   Source of Stress Concerns  -  -  Family  Family  Family      Psychosocial Discharge (Final Psychosocial Re-Evaluation): Psychosocial Re-Evaluation -  10/14/17 1545      Psychosocial Re-Evaluation   Current issues with  Current Stress Concerns her daughter died after a long illness    Comments  She has not returned to the program since her daughter died, unable to evaluate psychosocial issue(s)    Expected Outcomes  patient will remain free from psychosocial barriers to participation    Interventions  Encouraged to attend Pulmonary Rehabilitation for the exercise    Continue Psychosocial Services   Follow up required by staff    Comments  now she is dealing with her daughter's death      Initial Review   Source of Stress Concerns  Family       Education: Education Goals: Education classes will be provided on a weekly basis, covering required topics. Participant will state understanding/return demonstration of topics presented.  Learning Barriers/Preferences: Learning Barriers/Preferences - 06/09/17 1314      Learning Barriers/Preferences   Learning Barriers  None    Learning Preferences  Pictoral       Education Topics: Risk Factor Reduction:  -Group instruction that is supported by a PowerPoint presentation. Instructor discusses the definition of a risk factor, different risk factors for pulmonary disease, and how the heart and lungs work together.     PULMONARY REHAB OTHER RESPIRATORY from 10/02/2017 in [place]  Date  06/26/17  Educator  ep  Instruction Review Code  2- meets goals/outcomes  Nutrition for Pulmonary Patient:  -Group instruction provided by PowerPoint slides, verbal discussion, and written materials to support subject matter. The instructor gives an explanation and review of healthy diet recommendations, which includes a discussion on weight management, recommendations for fruit and vegetable consumption, as well as protein, fluid, caffeine, fiber, sodium, sugar, and alcohol. Tips for eating when patients are short of breath are discussed.   PULMONARY REHAB OTHER  RESPIRATORY from 10/02/2017 in [place]  Date  07/24/17  Educator  edna  Instruction Review Code  2- meets goals/outcomes      Pursed Lip Breathing:  -Group instruction that is supported by demonstration and informational handouts. Instructor discusses the benefits of pursed lip and diaphragmatic breathing and detailed demonstration on how to preform both.     Oxygen Safety:  -Group instruction provided by PowerPoint, verbal discussion, and written material to support subject matter. There is an overview of "What is Oxygen" and "Why do we need it".  Instructor also reviews how to create a safe environment for oxygen use, the importance of using oxygen as prescribed, and the risks of noncompliance. There is a brief discussion on traveling with oxygen and resources the patient may utilize.   PULMONARY REHAB OTHER RESPIRATORY from 10/02/2017 in [place]  Date  09/25/17  Educator  Truddie Crumble  Instruction Review Code  2- meets goals/outcomes      Oxygen Equipment:  -Group instruction provided by [employer] Staff utilizing handouts, written materials, and equipment demonstrations.   Signs and Symptoms:  -Group instruction provided by written material and verbal discussion to support subject matter. Warning signs and symptoms of infection, stroke, and heart attack are reviewed and when to call the physician/911 reinforced. Tips for preventing the spread of infection discussed.   PULMONARY REHAB OTHER RESPIRATORY from 10/02/2017 in [place]  Date  08/21/17  Educator  rn  Instruction Review Code  2- meets goals/outcomes      Advanced Directives:  -Group instruction provided by verbal instruction and written material to support subject matter. Instructor reviews Advanced Directive laws and proper instruction for filling out document.   Pulmonary Video:  -Group video education that reviews the  importance of medication and oxygen compliance, exercise, good nutrition, pulmonary hygiene, and pursed lip and diaphragmatic breathing for the pulmonary patient.   PULMONARY REHAB OTHER RESPIRATORY from 10/02/2017 in [place]  Date  08/28/17  Instruction Review Code  2- meets goals/outcomes      Exercise for the Pulmonary Patient:  -Group instruction that is supported by a PowerPoint presentation. Instructor discusses benefits of exercise, core components of exercise, frequency, duration, and intensity of an exercise routine, importance of utilizing pulse oximetry during exercise, safety while exercising, and options of places to exercise outside of rehab.     PULMONARY REHAB OTHER RESPIRATORY from 10/02/2017 in [place]  Date  08/12/17  Educator  ep  Instruction Review Code  2- meets goals/outcomes      Pulmonary Medications:  -Verbally interactive group education provided by instructor with focus on inhaled medications and proper administration.   PULMONARY REHAB OTHER RESPIRATORY from 10/02/2017 in [place]  Date  10/02/17  Educator  pharmacist  Instruction Review Code  R- Review/reinforce      Anatomy and Physiology of the Respiratory System and Intimacy:  -Group instruction provided by PowerPoint, verbal discussion, and written material  to support subject matter. Instructor reviews respiratory cycle and anatomical components of the respiratory system and their functions. Instructor also reviews differences in obstructive and restrictive respiratory diseases with examples of each. Intimacy, Sex, and Sexuality differences are reviewed with a discussion on how relationships can change when diagnosed with pulmonary disease. Common sexual concerns are reviewed.   MD DAY -A group question and answer session with a medical doctor that allows participants to ask questions that relate to  their pulmonary disease state.   PULMONARY REHAB OTHER RESPIRATORY from 10/02/2017 in [place]  Date  09/09/17  Educator  yacoub  Instruction Review Code  2- meets goals/outcomes      OTHER EDUCATION -Group or individual verbal, written, or video instructions that support the educational goals of the pulmonary rehab program.   Knowledge Questionnaire Score:   Core Components/Risk Factors/Patient Goals at Admission: Personal Goals and Risk Factors at Admission - 06/09/17 1325      Core Components/Risk Factors/Patient Goals on Admission    Weight Management  Obesity;Yes    Intervention  Weight Management: Develop a combined nutrition and exercise program designed to reach desired caloric intake, while maintaining appropriate intake of nutrient and fiber, sodium and fats, and appropriate energy expenditure required for the weight goal.    Admit Weight  276 lb 0.3 oz (125.2 kg)    Goal Weight: Short Term  271 lb 2.7 oz (123 kg)    Goal Weight: Long Term  264 lb 8.8 oz (120 kg)    Expected Outcomes  Short Term: Continue to assess and modify interventions until short term weight is achieved    Improve shortness of breath with ADL's  Yes    Intervention  Provide education, individualized exercise plan and daily activity instruction to help decrease symptoms of SOB with activities of daily living.    Expected Outcomes  Short Term: Achieves a reduction of symptoms when performing activities of daily living.    Develop more efficient breathing techniques such as purse lipped breathing and diaphragmatic breathing; and practicing self-pacing with activity  Yes    Intervention  Provide education, demonstration and support about specific breathing techniuqes utilized for more efficient breathing. Include techniques such as pursed lipped breathing, diaphragmatic breathing and self-pacing activity.    Expected Outcomes  Short Term: Participant will be able to  demonstrate and use breathing techniques as needed throughout daily activities.       Core Components/Risk Factors/Patient Goals Review:  Goals and Risk Factor Review    Row Name 07/01/17 1924 07/30/17 2104 08/22/17 1357 09/22/17 0834 10/14/17 1543     Core Components/Risk Factors/Patient Goals Review   Personal Goals Review  -  Weight Management/Obesity;Improve shortness of breath with ADL's;Develop more efficient breathing techniques such as purse lipped breathing and diaphragmatic breathing and practicing self-pacing with activity.  Weight Management/Obesity;Improve shortness of breath with ADL's;Develop more efficient breathing techniques such as purse lipped breathing and diaphragmatic breathing and practicing self-pacing with activity.  -  Weight Management/Obesity;Improve shortness of breath with ADL's;Develop more efficient breathing techniques such as purse lipped breathing and diaphragmatic breathing and practicing self-pacing with activity.   Review  patient has only attended 3 exercise sessions since admission and is too soon to evaluate progression towards rehab goals  patient doing well in rehab. tolerating workload increases and utilizing PLB for her shortness of breath  patient doing well in rehab. tolerating workload increases and utilizing PLB for her shortness of breath  patient doing  well in rehab. tolerating workload increases and utilizing PLB for her shortness of breath. she is s/p cardioversion two weeks ago but is unable to tell a difference in how she feels.   Has been doing well in pulmonary rehab physically, but her daughter died 2 weeks ago and she has been absent.  She is nearing graduation.   Expected Outcomes  see admission expected outcomes  see admission expected outcomes  see admission expected outcomes  see admission expected outcomes  see admission expected outcomes      Core Components/Risk Factors/Patient Goals at Discharge (Final Review):  Goals and Risk Factor  Review - 10/14/17 1543      Core Components/Risk Factors/Patient Goals Review   Personal Goals Review  Weight Management/Obesity;Improve shortness of breath with ADL's;Develop more efficient breathing techniques such as purse lipped breathing and diaphragmatic breathing and practicing self-pacing with activity.    Review  Has been doing well in pulmonary rehab physically, but her daughter died 2 weeks ago and she has been absent.  She is nearing graduation.    Expected Outcomes  see admission expected outcomes       ITP Comments:   Comments: ITP REVIEW Pt is making expected progress toward pulmonary rehab goals after completing 23 sessions. Recommend continued exercise, life style modification, education, and utilization of breathing techniques to increase stamina and strength and decrease shortness of breath with exertion.

## 2017-10-20 ENCOUNTER — Ambulatory Visit (INDEPENDENT_AMBULATORY_CARE_PROVIDER_SITE_OTHER): Payer: Medicare Other | Admitting: Family Medicine

## 2017-10-20 VITALS — BP 116/71 | HR 65 | Temp 97.8°F | Ht 67.0 in | Wt 259.0 lb

## 2017-10-20 DIAGNOSIS — R5383 Other fatigue: Secondary | ICD-10-CM | POA: Diagnosis not present

## 2017-10-20 DIAGNOSIS — Z6841 Body Mass Index (BMI) 40.0 and over, adult: Secondary | ICD-10-CM | POA: Diagnosis not present

## 2017-10-21 ENCOUNTER — Encounter (HOSPITAL_COMMUNITY)
Admission: RE | Admit: 2017-10-21 | Discharge: 2017-10-21 | Disposition: A | Discharge status: Home / Self Care | Payer: Medicare Other | Source: Ambulatory Visit | Attending: Internal Medicine | Admitting: Internal Medicine

## 2017-10-21 VITALS — Wt 264.8 lb

## 2017-10-21 DIAGNOSIS — Z950 Presence of cardiac pacemaker: Secondary | ICD-10-CM | POA: Diagnosis not present

## 2017-10-21 DIAGNOSIS — R0609 Other forms of dyspnea: Secondary | ICD-10-CM | POA: Diagnosis not present

## 2017-10-21 DIAGNOSIS — M1990 Unspecified osteoarthritis, unspecified site: Secondary | ICD-10-CM | POA: Diagnosis not present

## 2017-10-21 DIAGNOSIS — Z7951 Long term (current) use of inhaled steroids: Secondary | ICD-10-CM | POA: Diagnosis not present

## 2017-10-21 DIAGNOSIS — Z79899 Other long term (current) drug therapy: Secondary | ICD-10-CM | POA: Diagnosis not present

## 2017-10-21 DIAGNOSIS — Z7901 Long term (current) use of anticoagulants: Secondary | ICD-10-CM | POA: Diagnosis not present

## 2017-10-21 NOTE — Progress Notes (Signed)
Daily Session Note  Patient Details  Name: [name] [name] MRN: [id number] Date of Birth: June 01, 1941 Referring Provider:     Pulmonary Rehab Walk Test from 06/17/2017 in [place]  Referring Provider  Dr. Annamaria Boots      Encounter Date: 10/21/2017  Check In: Session Check In - 10/21/17 1330      Check-In   Location  [facility]    Staff Present  Su Hilt, MS, ACSM RCEP, Exercise Physiologist;Joan Leonia Reeves, RN, Luisa Hart, RN, BSN    Supervising physician immediately available to respond to emergencies  [facility] immediately available    Physician(s)  Dr. Maylene Roes    Medication changes reported      No    Fall or balance concerns reported     No    Tobacco Cessation  No Change    Warm-up and Cool-down  Performed as group-led instruction    Resistance Training Performed  Yes    VAD Patient?  No      Pain Assessment   Currently in Pain?  No/denies    Multiple Pain Sites  No       Capillary Blood Glucose: No results found for this or any previous visit (from the past 24 hour(s)).  Exercise Prescription Changes - 10/21/17 1500      Response to Exercise   Blood Pressure (Admit)  120/80    Blood Pressure (Exercise)  100/66    Blood Pressure (Exit)  90/46    Heart Rate (Admit)  73 bpm    Heart Rate (Exercise)  96 bpm    Heart Rate (Exit)  67 bpm    Oxygen Saturation (Admit)  94 %    Oxygen Saturation (Exercise)  94 %    Oxygen Saturation (Exit)  96 %    Rating of Perceived Exertion (Exercise)  13    Perceived Dyspnea (Exercise)  2    Duration  Progress to 45 minutes of aerobic exercise without signs/symptoms of physical distress    Intensity  THRR unchanged      Progression   Progression  Continue to progress workloads to maintain intensity without signs/symptoms of physical distress.      Resistance Training   Training Prescription  Yes    Weight  orange bands    Reps  10-15    Time  10 Minutes      Interval Training   Interval Training  No      NuStep   Level  6    Minutes  17    METs  3.2      Arm Ergometer   Level  5 workload reduced related to level 6.5 causes shoulder pain    Minutes  17      Track   Laps  11    Minutes  17       Social History   Tobacco Use  Smoking Status Former Smoker  . Packs/day: 2.00  . Years: 18.00  . Pack years: 36.00  . Types: Cigarettes  Smokeless Tobacco Never Used  Tobacco Comment   "quit smoking in 1971"    Goals Met:  Independence with exercise equipment Improved SOB with ADL's Using PLB without cueing & demonstrates good technique No report of cardiac concerns or symptoms Strength training completed today  Goals Unmet:  Not Applicable  Comments: Service time is from 1330 to 1500   Dr. Rush Farmer is Medical Director for Pulmonary Rehab at [facility].

## 2017-10-27 ENCOUNTER — Telehealth: Payer: Self-pay | Admitting: Cardiovascular Disease

## 2017-10-27 NOTE — Telephone Encounter (Signed)
New Message  Patient calling the office for samples of medication:   1.  What medication and dosage are you requesting samples for? Eliquis 86m   2.  Are you currently out of this medication? Yes

## 2017-10-27 NOTE — Telephone Encounter (Signed)
Patient aware samples are at the front desk for pick up  

## 2017-10-28 ENCOUNTER — Encounter: Payer: Self-pay | Admitting: Physician Assistant

## 2017-10-28 ENCOUNTER — Telehealth: Payer: Self-pay

## 2017-10-28 ENCOUNTER — Ambulatory Visit (INDEPENDENT_AMBULATORY_CARE_PROVIDER_SITE_OTHER): Payer: Medicare Other | Admitting: Physician Assistant

## 2017-10-28 ENCOUNTER — Encounter (HOSPITAL_COMMUNITY)
Admission: RE | Admit: 2017-10-28 | Discharge: 2017-10-28 | Disposition: A | Discharge status: Home / Self Care | Payer: Medicare Other | Source: Ambulatory Visit | Attending: Internal Medicine | Admitting: Internal Medicine

## 2017-10-28 VITALS — BP 108/70 | HR 56 | Temp 97.7°F | Resp 14 | Ht 67.0 in | Wt 260.0 lb

## 2017-10-28 DIAGNOSIS — S0990XA Unspecified injury of head, initial encounter: Secondary | ICD-10-CM

## 2017-10-28 NOTE — Progress Notes (Signed)
[name] [name] arrived at pulmonary rehab to exercise with bilateral blue/black eyes.  She states Sunday night she got tangled up in her telephone cord and fell on her face.  She is on an anti-coagulant.  She voiced that she has noticed blood running down the back of her throat and coughing up blood, but that has resolved today.  She was not allowed to exercise and was requested to see her PCP. Molly Divincenzo called and got an appointment for [name] [name] to see her PCP this afternoon.  She did not want to go, but we encouraged her to be checked out for safety measures.

## 2017-10-28 NOTE — Progress Notes (Signed)
Pre visit review using our clinic review tool, if applicable. No additional management support is needed unless otherwise documented below in the visit note

## 2017-10-28 NOTE — Patient Instructions (Signed)
Please continue to stay well-hydrated and get plenty of rest. You likely have a nasal fracture but without imaging we cannot be sure. The MD and I discussed and both examined you and we feel there is a low risk of bleed but as discussed cannot be certain without imaging. You are declining imaging today so if there is any sign of vision changes, headaches, dizziness, etc, you need to be taken to the ER for assessment.   Tylenol if needed for pain. Make sure you take it easy!

## 2017-10-28 NOTE — Progress Notes (Signed)
Patient presents to clinic today c/o fall and facial injury. Patient endorses tripping on a cord in her home Sunday afternoon and falling forward. States she put her hands up to her face to protect it but was not quick enough. Endorses smacking face into the floor. Denies loss of consciousness. Noted some initial epistaxis that resolved with applying pressure. Was able to go about her business after this incident. Woke up the next morning with bruising around her eyes and forehead. Has noted pressure and aching pain in her nose and forehead. Denies vision changes, dizziness, nausea or vomiting. Denies continued epistaxis. Denies clear drainage from nose. Patient was at her Cardiopulmonary rehab appointment today and was encouraged to get assessment giving injury and that she is on anticoagulation (Eliquis).  Past Medical History:  Diagnosis Date  . Alpha-1-antitrypsin deficiency carrier ([place])   . Arthritis    "all over"  . Atrial fibrillation ([place])   . Back pain   . Complication of anesthesia    "I woke up during 2 different procedures" (10/04/2014)  . Frequent UTI   . GERD (gastroesophageal reflux disease)   . Hypertension   . MVP (mitral valve prolapse)   . Obesity   . Osteoarthritis   . Presence of permanent cardiac pacemaker   . PVC's (premature ventricular contractions)   . Shortness of breath   . 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 Sleep apnea   . Stroke [facility]) 01/2014   denies deficits on 10/04/2014  . Tachy-brady syndrome ([place]) 10/2014  . TIA (transient ischemic attack)     Current Outpatient Medications on File Prior to Visit  Medication Sig Dispense Refill  . apixaban (ELIQUIS) 5 MG TABS tablet Take 1 tablet (5 mg total) by mouth 2 (two) times daily. 28 tablet 0  . Biotin 5000 MCG CAPS Take 5,000 mcg by mouth daily.     . Calcium Carbonate-Vit D-Min (CALCIUM 1200 PO) Take 2,400 mg by mouth daily.     . cholecalciferol  (VITAMIN D) 1000 UNITS tablet Take 1,000 Units by mouth daily.     Marland Kitchen Co-Enzyme Q10 100 MG CAPS Take 100 mg by mouth daily.     . diazepam (VALIUM) 5 MG tablet TAKE 1 TABLET BY MOUTH ONCE DAILY AS NEEDED FOR ANXIETY 30 tablet 1  . folic acid (FOLVITE) 354 MCG tablet Take 400 mcg by mouth daily.    Marland Kitchen glucosamine-chondroitin 500-400 MG tablet Take 1 tablet by mouth daily.     Marland Kitchen loratadine (KLS ALLERCLEAR) 10 MG tablet Take 10 mg by mouth daily.    Marland Kitchen MAGNESIUM CITRATE PO Take 250 mg by mouth daily. 1 tab daily    . metoprolol tartrate (LOPRESSOR) 25 MG tablet Take 0.5 tablets (12.5 mg total) by mouth 2 (two) times daily. 90 tablet 3  . nitroGLYCERIN (NITROSTAT) 0.4 MG SL tablet Place 0.4 mg under the tongue every 5 (five) minutes as needed for chest pain.    . pantoprazole (PROTONIX) 40 MG tablet TAKE 1 TABLET BY MOUTH DAILY 90 tablet 1  . Resveratrol 250 MG CAPS Take 250 mg by mouth daily.    . sertraline (ZOLOFT) 100 MG tablet Take 1 tablet (100 mg total) by mouth daily. 90 tablet 0  . Simethicone (GAS-X PO) Take 2 tablets by mouth daily as needed (bloating).     No current facility-administered medications on file prior to visit.     Allergies  Allergen Reactions  .  Ancef [Cefazolin] Hives and Itching  . Pseudoephedrine Hcl     Other reaction(s): palpitations (moderate)  . Caffeine Palpitations    Family History  Problem Relation Age of Onset  . Heart attack Mother   . Sudden death Mother   . Depression Mother   . Stroke Father   . Heart failure Father   . Depression Father   . Heart failure Brother   . Stroke Brother     Social History   Socioeconomic History  . Marital status: Divorced    Spouse name: None  . Number of children: 3  . Years of education: college  . Highest education level: None  Social Needs  . Financial resource strain: None  . Food insecurity - worry: None  . Food insecurity - inability: None  . Transportation needs - medical: None  .  Transportation needs - non-medical: None  Occupational History  . Occupation: Retired Engineer, site  Tobacco Use  . Smoking status: Former Smoker    Packs/day: 2.00    Years: 18.00    Pack years: 36.00    Types: Cigarettes  . Smokeless tobacco: Never Used  . Tobacco comment: "quit smoking in 1971"  Substance and Sexual Activity  . Alcohol use: Yes    Alcohol/week: 0.0 oz    Comment: 10/04/2014 "might have a drink a couple times/yr"  . Drug use: No  . Sexual activity: No  Other Topics Concern  . None  Social History Narrative   Patient lives alone in one story house.   Patient is right handed.   Patient has a college degree.   Patient is retired.    Review of Systems - See HPI.  All other ROS are negative.  BP 108/70   Pulse (!) 56   Temp 97.7 F (36.5 C) (Oral)   Resp 14   Ht _0  (1.702 m)   Wt 260 lb (117.9 kg)   SpO2 94%   BMI 40.72 kg/m   Physical Exam  Constitutional: She is oriented to person, place, and time and well-developed, well-nourished, and in no distress.  HENT:  Head: Normocephalic. Head is with abrasion (1.5 cm superficial abrasion on the bridge of the nose. ) and with contusion. Head is without Battle's sign.    Nose: Sinus tenderness present. No mucosal edema or rhinorrhea. No epistaxis.  Neck: Normal range of motion and full passive range of motion without pain. Neck supple. No spinous process tenderness and no muscular tenderness present.  Cardiovascular: Normal rate, regular rhythm, normal heart sounds and intact distal pulses.  Pulmonary/Chest: Effort normal and breath sounds normal. No respiratory distress.  Musculoskeletal:       Cervical back: She exhibits normal range of motion and no tenderness.  Neurological: She is alert and oriented to person, place, and time. No cranial nerve deficit. GCS score is 15.  Skin: Skin is warm and dry.  Psychiatric: Affect normal.  Vitals reviewed.   Recent Results (from the past 2160 hour(s))    CUP PACEART INCLINIC DEVICE CHECK     Status: None   Collection Time: 08/20/17  4:06 PM  Result Value Ref Range   Date Time Interrogation Session [id number]    Pulse Generator Manufacturer MERM    Pulse Gen Model A2DR01 Advisa DR MRI    Pulse Gen Serial Number [id number]    Clinic Name [place] Pulse Generator Type Implantable Pulse Generator    Implantable Pulse Generator Implant Date [id number]  Implantable Lead Manufacturer MERM    Implantable Lead Model 5076 CapSureFix Novus    Implantable Lead Serial Number [id number]    Implantable Lead Implant Date 18299371    Implantable Lead Location Detail 1 APPENDAGE    Implantable Lead Location [phone]    Implantable Lead Manufacturer [facility]    Implantable Lead Model 5076 CapSureFix Novus    Implantable Lead Serial Number [id number]    Implantable Lead Implant Date 58527782    Implantable Lead Location Detail 1 APEX    Implantable Lead Location [phone]    Lead Channel Setting Sensing Sensitivity 2.8 mV   Lead Channel Setting Pacing Amplitude 1.5 V   Lead Channel Setting Pacing Pulse Width 0.4 ms   Lead Channel Setting Pacing Amplitude 2 V   Lead Channel Impedance Value 475 ohm   Lead Channel Impedance Value 380 ohm   Lead Channel Sensing Intrinsic Amplitude 1.875 mV   Lead Channel Sensing Intrinsic Amplitude 2.25 mV   Lead Channel Pacing Threshold Amplitude 0.5 V   Lead Channel Pacing Threshold Pulse Width 0.4 ms   Lead Channel Impedance Value 532 ohm   Lead Channel Impedance Value 494 ohm   Lead Channel Sensing Intrinsic Amplitude 9.625 mV   Lead Channel Sensing Intrinsic Amplitude 7.375 mV   Lead Channel Pacing Threshold Amplitude 0.5 V   Lead Channel Pacing Threshold Pulse Width 0.4 ms   Battery Status OK    Battery Remaining Longevity 78 mo   Battery Voltage 3.01 V   Brady Statistic RA Percent Paced 29.04 %   Brady Statistic RV Percent Paced 16.89 %   Brady Statistic AP VP Percent 0.02 %   Brady  Statistic AS VP Percent 16.25 %   Brady Statistic AP VS Percent 40.23 %   Brady Statistic AS VS Percent 43.50 %   Eval Rhythm AF/Vs   Basic metabolic panel     Status: Abnormal   Collection Time: 08/27/17  3:09 PM  Result Value Ref Range   Glucose 80 65 - 99 mg/dL   BUN 16 8 - 27 mg/dL   Creatinine, Ser 1.03 (H) 0.57 - 1.00 mg/dL   GFR calc non Af Amer 53 (L) >59 mL/min/1.73   GFR calc Af Amer 61 >59 mL/min/1.73   BUN/Creatinine Ratio 16 12 - 28   Sodium 140 134 - 144 mmol/L   Potassium 4.9 3.5 - 5.2 mmol/L   Chloride 103 96 - 106 mmol/L   CO2 25 20 - 29 mmol/L   Calcium 9.2 8.7 - 10.3 mg/dL  CBC     Status: None   Collection Time: 08/27/17  3:09 PM  Result Value Ref Range   WBC 9.2 3.4 - 10.8 x10E3/uL   RBC 5.22 3.77 - 5.28 x10E6/uL   Hemoglobin 15.0 11.1 - 15.9 g/dL   Hematocrit 45.9 34.0 - 46.6 %   MCV 88 79 - 97 fL   MCH 28.7 26.6 - 33.0 pg   MCHC 32.7 31.5 - 35.7 g/dL   RDW 13.7 12.3 - 15.4 %   Platelets 258 150 - 379 x10E3/uL  Protime-INR     Status: None   Collection Time: 08/27/17  3:09 PM  Result Value Ref Range   INR 1.1 0.8 - 1.2    Comment: Reference interval is for non-anticoagulated patients. Suggested INR therapeutic range for Vitamin K antagonist therapy:    Standard Dose (moderate intensity                   therapeutic  range):       2.0 - 3.0    Higher intensity therapeutic range       2.5 - 3.5    Prothrombin Time 11.2 9.1 - 12.0 sec  CUP PACEART REMOTE DEVICE CHECK     Status: None   Collection Time: 09/02/17  1:28 PM  Result Value Ref Range   Date Time Interrogation Session [phone]    Pulse Generator Manufacturer MERM    Pulse Gen Model A2DR01 Advisa DR MRI    Pulse Gen Serial Number [id number]    Clinic Name [place] Pulse Generator Type Implantable Pulse Generator    Implantable Pulse Generator Implant Date [id number]    Implantable Lead Manufacturer MERM    Implantable Lead Model 5076 CapSureFix Novus     Implantable Lead Serial Number [id number]    Implantable Lead Implant Date 09983382    Implantable Lead Location Detail 1 APPENDAGE    Implantable Lead Location [id number]    Implantable Lead Manufacturer MERM    Implantable Lead Model 5076 CapSureFix Novus    Implantable Lead Serial Number [id number]    Implantable Lead Implant Date 50539767    Implantable Lead Location Detail 1 APEX    Implantable Lead Location [id number]    Lead Channel Setting Sensing Sensitivity 2.8 mV   Lead Channel Setting Pacing Amplitude 1.5 V   Lead Channel Setting Pacing Pulse Width 0.4 ms   Lead Channel Setting Pacing Amplitude 2 V   Lead Channel Impedance Value 475 ohm   Lead Channel Impedance Value 380 ohm   Lead Channel Sensing Intrinsic Amplitude 2.625 mV   Lead Channel Sensing Intrinsic Amplitude 2.625 mV   Lead Channel Pacing Threshold Amplitude 0.5 V   Lead Channel Pacing Threshold Pulse Width 0.4 ms   Lead Channel Impedance Value 513 ohm   Lead Channel Impedance Value 494 ohm   Lead Channel Sensing Intrinsic Amplitude 7.75 mV   Lead Channel Sensing Intrinsic Amplitude 7.75 mV   Lead Channel Pacing Threshold Amplitude 0.5 V   Lead Channel Pacing Threshold Pulse Width 0.4 ms   Battery Status OK    Battery Remaining Longevity 78 mo   Battery Voltage 3.01 V   Brady Statistic RA Percent Paced 0 %   Brady Statistic RV Percent Paced 25.62 %   Brady Statistic AP VP Percent 0 %   Brady Statistic AS VP Percent 24.51 %   Brady Statistic AP VS Percent 0 %   Brady Statistic AS VS Percent 75.49 %   Eval Rhythm AF/Vs/Vp   CUP PACEART REMOTE DEVICE CHECK     Status: None   Collection Time: 09/10/17 12:47 PM  Result Value Ref Range   Date Time Interrogation Session [phone]    Pulse Generator Manufacturer MERM    Pulse Gen Model A2DR01 Advisa DR MRI    Pulse Gen Serial Number [id number]    Clinic Name [place]    Implantable Pulse Generator Type Implantable Pulse Generator    Implantable Pulse  Generator Implant Date 53299242    Implantable Lead Manufacturer [facility]    Implantable Lead Model 5076 CapSureFix Novus    Implantable Lead Serial Number [id number]    Implantable Lead Implant Date 97989211    Implantable Lead Location Detail 1 APPENDAGE    Implantable Lead Location [id number]    Implantable Lead Manufacturer [facility]    Implantable Lead Model 5076 CapSureFix Novus    Implantable Lead Serial Number [id number]    Implantable Lead Implant  Date 32671245    Implantable Lead Location Detail 1 APEX    Implantable Lead Location [phone]    Lead Channel Setting Sensing Sensitivity 2.8 mV   Lead Channel Setting Pacing Amplitude 1.5 V   Lead Channel Setting Pacing Pulse Width 0.4 ms   Lead Channel Setting Pacing Amplitude 2 V   Lead Channel Impedance Value 475 ohm   Lead Channel Impedance Value 418 ohm   Lead Channel Sensing Intrinsic Amplitude 5.5 mV   Lead Channel Sensing Intrinsic Amplitude 5.5 mV   Lead Channel Pacing Threshold Amplitude 0.625 V   Lead Channel Pacing Threshold Pulse Width 0.4 ms   Lead Channel Impedance Value 513 ohm   Lead Channel Impedance Value 475 ohm   Lead Channel Sensing Intrinsic Amplitude 6.25 mV   Lead Channel Sensing Intrinsic Amplitude 6.25 mV   Lead Channel Pacing Threshold Amplitude 0.5 V   Lead Channel Pacing Threshold Pulse Width 0.4 ms   Battery Status OK    Battery Remaining Longevity 78 mo   Battery Voltage 3.02 V   Brady Statistic RA Percent Paced 87.65 %   Brady Statistic RV Percent Paced 0.09 %   Brady Statistic AP VP Percent 0.07 %   Brady Statistic AS VP Percent 0.01 %   Brady Statistic AP VS Percent 87.85 %   Brady Statistic AS VS Percent 12.06 %   Eval Rhythm ApVs   CBC With Differential     Status: Abnormal   Collection Time: 09/17/17 11:31 AM  Result Value Ref Range   WBC 8.5 3.4 - 10.8 x10E3/uL   RBC 5.22 3.77 - 5.28 x10E6/uL   Hemoglobin 14.9 11.1 - 15.9 g/dL   Hematocrit 45.1 34.0 - 46.6 %   MCV 86 79 - 97 fL   MCH 28.5  26.6 - 33.0 pg   MCHC 33.0 31.5 - 35.7 g/dL   RDW 13.5 12.3 - 15.4 %   Neutrophils 58 Not Estab. %   Lymphs 24 Not Estab. %   Monocytes 14 Not Estab. %   Eos 3 Not Estab. %   Basos 0 Not Estab. %   Neutrophils Absolute 5.0 1.4 - 7.0 x10E3/uL   Lymphocytes Absolute 2.0 0.7 - 3.1 x10E3/uL   Monocytes Absolute 1.2 (H) 0.1 - 0.9 x10E3/uL   EOS (ABSOLUTE) 0.2 0.0 - 0.4 x10E3/uL   Basophils Absolute 0.0 0.0 - 0.2 x10E3/uL   Immature Granulocytes 1 Not Estab. %   Immature Grans (Abs) 0.0 0.0 - 0.1 x10E3/uL  Comprehensive metabolic panel     Status: Abnormal   Collection Time: 09/17/17 11:31 AM  Result Value Ref Range   Glucose 97 65 - 99 mg/dL   BUN 14 8 - 27 mg/dL   Creatinine, Ser 1.07 (H) 0.57 - 1.00 mg/dL   GFR calc non Af Amer 51 (L) >59 mL/min/1.73   GFR calc Af Amer 58 (L) >59 mL/min/1.73   BUN/Creatinine Ratio 13 12 - 28   Sodium 139 134 - 144 mmol/L   Potassium 4.2 3.5 - 5.2 mmol/L   Chloride 102 96 - 106 mmol/L   CO2 21 20 - 29 mmol/L   Calcium 8.9 8.7 - 10.3 mg/dL   Total Protein 6.8 6.0 - 8.5 g/dL   Albumin 3.8 3.5 - 4.8 g/dL   Globulin, Total 3.0 1.5 - 4.5 g/dL   Albumin/Globulin Ratio 1.3 1.2 - 2.2   Bilirubin Total 0.4 0.0 - 1.2 mg/dL   Alkaline Phosphatase 81 39 - 117 IU/L   AST 30  0 - 40 IU/L   ALT 16 0 - 32 IU/L  Hemoglobin A1c     Status: Abnormal   Collection Time: 09/17/17 11:31 AM  Result Value Ref Range   Hgb A1c MFr Bld 6.2 (H) 4.8 - 5.6 %    Comment:          Prediabetes: 5.7 - 6.4          Diabetes: >6.4          Glycemic control for adults with diabetes: <7.0    Est. average glucose Bld gHb Est-mCnc 131 mg/dL  Insulin, random     Status: Abnormal   Collection Time: 09/17/17 11:31 AM  Result Value Ref Range   INSULIN 47.7 (H) 2.6 - 24.9 uIU/mL  Lipid Panel With LDL/HDL Ratio     Status: None   Collection Time: 09/17/17 11:31 AM  Result Value Ref Range   Cholesterol, Total 164 100 - 199 mg/dL   Triglycerides 117 0 - 149 mg/dL   HDL 47 >39 mg/dL    VLDL Cholesterol Cal 23 5 - 40 mg/dL   LDL Calculated 94 0 - 99 mg/dL   LDl/HDL Ratio 2.0 0.0 - 3.2 ratio    Comment:                                     LDL/HDL Ratio                                             Men  Women                               1/2 Avg.Risk  1.0    1.5                                   Avg.Risk  3.6    3.2                                2X Avg.Risk  6.2    5.0                                3X Avg.Risk  8.0    6.1   T3     Status: None   Collection Time: 09/17/17 11:31 AM  Result Value Ref Range   T3, Total 140 71 - 180 ng/dL  T4, free     Status: None   Collection Time: 09/17/17 11:31 AM  Result Value Ref Range   Free T4 1.04 0.82 - 1.77 ng/dL  TSH     Status: None   Collection Time: 09/17/17 11:31 AM  Result Value Ref Range   TSH 2.040 0.450 - 4.500 uIU/mL  VITAMIN D 25 Hydroxy (Vit-D Deficiency, Fractures)     Status: None   Collection Time: 09/17/17 11:31 AM  Result Value Ref Range   Vit D, 25-Hydroxy 49.4 30.0 - 100.0 ng/mL    Comment: Vitamin D deficiency has been defined by the Institute of Medicine and an Endocrine Society practice guideline as a level  of serum 25-OH vitamin D less than 20 ng/mL (1,2). The Endocrine Society went on to further define vitamin D insufficiency as a level between 21 and 29 ng/mL (2). 1. IOM (Institute of Medicine). 2010. Dietary reference    intakes for calcium and D. [place]: The    [employer]. 2. Holick MF, Binkley Ehrenberg, Bischoff-Ferrari HA, et al.    Evaluation, treatment, and prevention of vitamin D    deficiency: an Endocrine Society clinical practice    guideline. JCEM. 2011 Jul; 96(7):1911-30.   CUP PACEART REMOTE DEVICE CHECK     Status: None   Collection Time: 10/07/17 12:34 AM  Result Value Ref Range   Date Time Interrogation Session [phone]    Pulse Generator Manufacturer MERM    Pulse Gen Model A2DR01 Advisa DR MRI    Pulse Gen Serial Number [id number]    Clinic Name  [place]    Implantable Pulse Generator Type Implantable Pulse Generator    Implantable Pulse Generator Implant Date [id number]    Implantable Lead Manufacturer MERM    Implantable Lead Model 5076 CapSureFix Novus    Implantable Lead Serial Number [id number]    Implantable Lead Implant Date 19509326    Implantable Lead Location Detail 1 APPENDAGE    Implantable Lead Location [id number]    Implantable Lead Manufacturer MERM    Implantable Lead Model 5076 CapSureFix Novus    Implantable Lead Serial Number [id number]    Implantable Lead Implant Date 71245809    Implantable Lead Location Detail 1 APEX    Implantable Lead Location [id number]    Lead Channel Setting Sensing Sensitivity 2.8 mV   Lead Channel Setting Pacing Amplitude 1.5 V   Lead Channel Setting Pacing Pulse Width 0.4 ms   Lead Channel Setting Pacing Amplitude 2 V   Lead Channel Impedance Value 494 ohm   Lead Channel Impedance Value 437 ohm   Lead Channel Sensing Intrinsic Amplitude 4.625 mV   Lead Channel Sensing Intrinsic Amplitude 4.625 mV   Lead Channel Pacing Threshold Amplitude 0.625 V   Lead Channel Pacing Threshold Pulse Width 0.4 ms   Lead Channel Impedance Value 513 ohm   Lead Channel Impedance Value 494 ohm   Lead Channel Sensing Intrinsic Amplitude 7.375 mV   Lead Channel Sensing Intrinsic Amplitude 7.375 mV   Lead Channel Pacing Threshold Amplitude 0.5 V   Lead Channel Pacing Threshold Pulse Width 0.4 ms   Battery Status OK    Battery Remaining Longevity 96 mo   Battery Voltage 3.02 V   Brady Statistic RA Percent Paced 90.48 %   Brady Statistic RV Percent Paced 0.06 %   Brady Statistic AP VP Percent 0.05 %   Brady Statistic AS VP Percent 0 %   Brady Statistic AP VS Percent 90.82 %   Brady Statistic AS VS Percent 9.13 %   Eval Rhythm ApVs     Assessment/Plan: 1. Traumatic injury of head, initial encounter 47 hours post incident. Neuro examination unremarkable which is reassuring. Discussed potential  for fracture of facial bone and potential for slow bleed giving trauma and anticoagulation. Patient agrees to x-ray stating she will have done in the morning. Declines CT scan. Spoke with supervising MD who also assessed patient and feels we are outside the window of major concern. We both discussed that the only way to be certain would be with a CT scan. Patient endorses knowledge of risks of foregoing imaging. Declines CT. Supportive measures and OTC medications reviewed. Will call with  x-ray result. Strict ER precautions reviewed with patient.     Leeanne Rio, PA-C

## 2017-10-28 NOTE — Telephone Encounter (Signed)
Copied from [place]. Topic: Quick Communication - Office Called Patient >> Oct 28, 2017  1:40 PM Gerilyn Nestle, RN wrote: Reason for CRM: LM requesting call back to discuss fall. When patient calls back please warm transfer to Roderic Ovens, RN @ [phone].   Spoke with patients daughter, Manuela Schwartz.  Manuela Schwartz reports on Sunday, 10/26/17, patient tripped over phone cord and fell on her face. Daughter reports patient has bruised/black eyes bilaterally and a cut on her nose. Denies loss of consciousness, headache, visual or mental changes. Patient scheduled to see Elyn Aquas, PA-C today at 3:45 pm.

## 2017-10-28 NOTE — Telephone Encounter (Signed)
Noted. Will see patient at visit.

## 2017-10-29 ENCOUNTER — Ambulatory Visit (INDEPENDENT_AMBULATORY_CARE_PROVIDER_SITE_OTHER)
Admission: RE | Admit: 2017-10-29 | Discharge: 2017-10-29 | Disposition: A | Discharge status: Home / Self Care | Payer: Medicare Other | Source: Ambulatory Visit | Attending: Physician Assistant | Admitting: Physician Assistant

## 2017-10-29 ENCOUNTER — Ambulatory Visit: Payer: Medicare Other

## 2017-10-29 ENCOUNTER — Other Ambulatory Visit: Payer: Self-pay | Admitting: Physician Assistant

## 2017-10-29 ENCOUNTER — Telehealth: Payer: Self-pay | Admitting: Physician Assistant

## 2017-10-29 DIAGNOSIS — S0990XA Unspecified injury of head, initial encounter: Secondary | ICD-10-CM

## 2017-10-29 DIAGNOSIS — S0993XA Unspecified injury of face, initial encounter: Secondary | ICD-10-CM | POA: Diagnosis not present

## 2017-10-29 NOTE — Telephone Encounter (Signed)
Contacted by X-ray at [facility] office. Patient there for x-ray but now wanting to proceed with Ct that she refused at yesterday's visit. Orders changed. Will call with results.

## 2017-10-30 ENCOUNTER — Telehealth: Payer: Self-pay | Admitting: *Deleted

## 2017-10-30 ENCOUNTER — Encounter (HOSPITAL_COMMUNITY): Payer: Medicare Other

## 2017-10-30 NOTE — Telephone Encounter (Signed)
Spoke with patient and notified, she verbalized understanding. She will contact the office with any changes.

## 2017-10-30 NOTE — Telephone Encounter (Signed)
She likely suffered a minor concussion from the fall. No major concussion suspected and again CT head looked good which is very reassuring.

## 2017-10-30 NOTE — Telephone Encounter (Signed)
  Spoke with patient, she had more questions regarding CT and wanted to clarify which bones in her bone were broken, which we went over and explained your notes. She wanted to know if she needs to be tested for a concussion?    Copied from [place] [phone]. Topic: Referral - Question >> Oct 30, 2017 10:36 AM Marja Kays F wrote: Reason for CRM: pt states someone called her this morning regarding her  Referral but she was not clear on everything and would like a call back [phone]

## 2017-10-30 NOTE — Progress Notes (Signed)
Office: [phone]  /  Fax: [phone]   HPI:   Chief Complaint: OBESITY [name] [name] is here to discuss her progress with her obesity treatment plan. She is on the portion control better and make smarter food choices, such as increase vegetables and decrease simple carbohydrates plan and is following her eating plan approximately 50 % of the time. She states she is exercising 0 minutes 0 times per week. [name] [name] continues to do well with weight loss efforts, but she is sometimes skipping meals and not eating enough protein. Her weight is 259 lb (117.5 kg) today and has had a weight loss of 6 pounds over a period of 3 weeks since her last visit. She has lost 8 lbs since starting treatment with [place].  Fatigue [name] [name] notes feeling more fatigued in the last 2 to 3 weeks and wonders if her Afib has gotten out of control. Physical exam shows normal rate and rhythm and no lung crackles or wheezes.   ALLERGIES: Allergies  Allergen Reactions  . Ancef [Cefazolin] Hives and Itching  . Pseudoephedrine Hcl     Other reaction(s): palpitations (moderate)  . Caffeine Palpitations    MEDICATIONS: Current Outpatient Medications on File Prior to Visit  Medication Sig Dispense Refill  . apixaban (ELIQUIS) 5 MG TABS tablet Take 1 tablet (5 mg total) by mouth 2 (two) times daily. 28 tablet 0  . Biotin 5000 MCG CAPS Take 5,000 mcg by mouth daily.     . Calcium Carbonate-Vit D-Min (CALCIUM 1200 PO) Take 2,400 mg by mouth daily.     . cholecalciferol (VITAMIN D) 1000 UNITS tablet Take 1,000 Units by mouth daily.     Marland Kitchen Co-Enzyme Q10 100 MG CAPS Take 100 mg by mouth daily.     . diazepam (VALIUM) 5 MG tablet TAKE 1 TABLET BY MOUTH ONCE DAILY AS NEEDED FOR ANXIETY 30 tablet 1  . folic acid (FOLVITE) 244 MCG tablet Take 400 mcg by mouth daily.    Marland Kitchen glucosamine-chondroitin 500-400 MG tablet Take 1 tablet by mouth daily.     Marland Kitchen loratadine (KLS ALLERCLEAR) 10 MG tablet Take 10 mg by mouth daily.    Marland Kitchen MAGNESIUM  CITRATE PO Take 250 mg by mouth daily. 1 tab daily    . metoprolol tartrate (LOPRESSOR) 25 MG tablet Take 0.5 tablets (12.5 mg total) by mouth 2 (two) times daily. 90 tablet 3  . nitroGLYCERIN (NITROSTAT) 0.4 MG SL tablet Place 0.4 mg under the tongue every 5 (five) minutes as needed for chest pain.    . pantoprazole (PROTONIX) 40 MG tablet TAKE 1 TABLET BY MOUTH DAILY 90 tablet 1  . Resveratrol 250 MG CAPS Take 250 mg by mouth daily.    . sertraline (ZOLOFT) 100 MG tablet Take 1 tablet (100 mg total) by mouth daily. 90 tablet 0  . Simethicone (GAS-X PO) Take 2 tablets by mouth daily as needed (bloating).     No current facility-administered medications on file prior to visit.     PAST MEDICAL HISTORY: Past Medical History:  Diagnosis Date  . Alpha-1-antitrypsin deficiency carrier ([place])   . Arthritis    "all over"  . Atrial fibrillation ([place])   . Back pain   . Complication of anesthesia    "I woke up during 2 different procedures" (10/04/2014)  . Frequent UTI   . GERD (gastroesophageal reflux disease)   . Hypertension   . MVP (mitral valve prolapse)   . Obesity   . Osteoarthritis   . Presence of  permanent cardiac pacemaker   . PVC's (premature ventricular contractions)   . Shortness of breath   . 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 Sleep apnea   . Stroke [facility]) 01/2014   denies deficits on 10/04/2014  . Tachy-brady syndrome ([place]) 10/2014  . TIA (transient ischemic attack)     PAST SURGICAL HISTORY: Past Surgical History:  Procedure Laterality Date  . CARDIOVERSION N/A 09/02/2017   Procedure: CARDIOVERSION;  Surgeon: Sanda Klein, MD;  Location: [facility];  Service: Cardiovascular;  Laterality: N/A;  . EXCISION VAGINAL CYST     benign nodules  . INSERT / REPLACE / REMOVE PACEMAKER  10/04/2014   Medtronic Advisa model [id number] serial number [id number]  . LOOP RECORDER EXPLANT N/A 10/04/2014   Procedure: LOOP RECORDER  EXPLANT;  Surgeon: Sanda Klein, MD;  Location: [place] CATH LAB;  Service: Cardiovascular;  Laterality: N/A;  . LOOP RECORDER IMPLANT N/A 04/19/2014   Procedure: LOOP RECORDER IMPLANT;  Surgeon: Sanda Klein, MD;  Location: [place] CATH LAB;  Service: Cardiovascular;  Laterality: N/A;  . NM MYOCAR PERF WALL MOTION  12/18/2007   normal  . PERMANENT PACEMAKER INSERTION N/A 10/04/2014   Procedure: PERMANENT PACEMAKER INSERTION;  Surgeon: Sanda Klein, MD;  Location: [place] CATH LAB;  Service: Cardiovascular;  Laterality: N/A;  . TUBAL LIGATION    . US ECHOCARDIOGRAPHY  05/15/2010   LA mildly dilated,mild mitral annular ca+, AOV mildly sclerotic, mild asymmetric LVH    SOCIAL HISTORY: Social History   Tobacco Use  . Smoking status: Former Smoker    Packs/day: 2.00    Years: 18.00    Pack years: 36.00    Types: Cigarettes  . Smokeless tobacco: Never Used  . Tobacco comment: "quit smoking in 1971"  Substance Use Topics  . Alcohol use: Yes    Alcohol/week: 0.0 oz    Comment: 10/04/2014 "might have a drink a couple times/yr"  . Drug use: No    FAMILY HISTORY: Family History  Problem Relation Age of Onset  . Heart attack Mother   . Sudden death Mother   . Depression Mother   . Stroke Father   . Heart failure Father   . Depression Father   . Heart failure Brother   . Stroke Brother     ROS: Review of Systems  Constitutional: Positive for malaise/fatigue and weight loss.  Respiratory: Negative for wheezing.        Negative crackles    PHYSICAL EXAM: Blood pressure 116/71, pulse 65, temperature 97.8 F (36.6 C), temperature source Oral, height _0  (1.702 m), weight 259 lb (117.5 kg), SpO2 94 %. Body mass index is 40.57 kg/m. Physical Exam  Constitutional: She is oriented to person, place, and time. She appears well-developed and well-nourished.  Cardiovascular: Normal rate.  Pulmonary/Chest: Effort normal. She has no wheezes.  Negative crackles  Musculoskeletal: Normal range of  motion.  Neurological: She is oriented to person, place, and time.  Skin: Skin is warm and dry.  Psychiatric: She has a normal mood and affect. Her behavior is normal.  Vitals reviewed.   RECENT LABS AND TESTS: BMET    Component Value Date/Time   NA 139 09/17/2017 1131   K 4.2 09/17/2017 1131   CL 102 09/17/2017 1131   CO2 21 09/17/2017 1131   GLUCOSE 97 09/17/2017 1131   GLUCOSE 92 01/15/2017 1417   BUN 14 09/17/2017 1131   CREATININE 1.07 (H) 09/17/2017 1131   CREATININE 0.86 09/28/2014  1557   CALCIUM 8.9 09/17/2017 1131   GFRNONAA 51 (L) 09/17/2017 1131   GFRNONAA 56 (L) 06/06/2014 1653   GFRAA 58 (L) 09/17/2017 1131   GFRAA 65 06/06/2014 1653   Lab Results  Component Value Date   HGBA1C 6.2 (H) 09/17/2017   HGBA1C 5.9 (H) 03/09/2014   Lab Results  Component Value Date   INSULIN 47.7 (H) 09/17/2017   CBC    Component Value Date/Time   WBC 8.5 09/17/2017 1131   WBC 9.1 02/24/2017 1555   RBC 5.22 09/17/2017 1131   RBC 5.09 02/24/2017 1555   HGB 14.9 09/17/2017 1131   HCT 45.1 09/17/2017 1131   PLT 258 08/27/2017 1509   MCV 86 09/17/2017 1131   MCH 28.5 09/17/2017 1131   MCH 29.0 09/28/2014 1557   MCHC 33.0 09/17/2017 1131   MCHC 33.9 02/24/2017 1555   RDW 13.5 09/17/2017 1131   LYMPHSABS 2.0 09/17/2017 1131   MONOABS 1.3 (H) 02/24/2017 1555   EOSABS 0.2 09/17/2017 1131   BASOSABS 0.0 09/17/2017 1131   Iron/TIBC/Ferritin/ %Sat No results found for: IRON, TIBC, FERRITIN, IRONPCTSAT Lipid Panel     Component Value Date/Time   CHOL 164 09/17/2017 1131   TRIG 117 09/17/2017 1131   HDL 47 09/17/2017 1131   CHOLHDL 3 01/15/2017 1417   VLDL 33.6 01/15/2017 1417   LDLCALC 94 09/17/2017 1131   LDLDIRECT 81.0 07/15/2016 1419   Hepatic Function Panel     Component Value Date/Time   PROT 6.8 09/17/2017 1131   ALBUMIN 3.8 09/17/2017 1131   AST 30 09/17/2017 1131   ALT 16 09/17/2017 1131   ALKPHOS 81 09/17/2017 1131   BILITOT 0.4 09/17/2017 1131    BILIDIR 0.1 01/15/2017 1417      Component Value Date/Time   TSH 2.040 09/17/2017 1131   TSH 2.70 07/15/2016 1419   TSH 1.529 06/06/2014 1608    ASSESSMENT AND PLAN: Other fatigue  Class 3 severe obesity with serious comorbidity and body mass index (BMI) of 40.0 to 44.9 in adult, unspecified obesity type (HCC)  PLAN:  Fatigue [name] [name] was informed that her fatigue may be related to obesity, depression or many other causes. She was reassured her fatigue was unlikely to be due to a current Afib exacerbation and to follow up with cardiology as directed. [name] [name] has agreed to work on diet, exercise and weight loss to help with fatigue.   We spent > than 50% of the 15 minute visit on the counseling as documented in the note.   Obesity [name] [name] is currently in the action stage of change. As such, her goal is to continue with weight loss efforts She has agreed to portion control better and make smarter food choices, such as increase vegetables and decrease simple carbohydrates  [name] [name] has been instructed to work up to a goal of 150 minutes of combined cardio and strengthening exercise per week for weight loss and overall health benefits. We discussed the following Behavioral Modification Strategies today: holiday eating strategies and no skipping meals  [name] [name] has agreed to follow up with our clinic in 2 to 3 weeks. She was informed of the importance of frequent follow up visits to maximize her success with intensive lifestyle modifications for her multiple health conditions.  I, Doreene Nest, am acting as transcriptionist for Dennard Nip, MD  I have reviewed the above documentation for accuracy and completeness, and I agree with the above. -Dennard Nip, MD    OBESITY BEHAVIORAL INTERVENTION VISIT  Today's  visit was # 3 out of 22.  Starting weight: 267 lbs Starting date: 09/17/17 Today's weight : 259 lbs Today's date: 10/20/2017 Total lbs lost to date: 8 (Patients must lose 7  lbs in the first 6 months to continue with counseling)   ASK: We discussed the diagnosis of obesity with [name] [name] today and [name] [name] agreed to give [place] permission to discuss obesity behavioral modification therapy today.  ASSESS: [name] [name] has the diagnosis of obesity and her BMI today is 40.56 [name] [name] is in the action stage of change   ADVISE: [name] [name] was educated on the multiple health risks of obesity as well as the benefit of weight loss to improve her health. She was advised of the need for long term treatment and the importance of lifestyle modifications.  AGREE: Multiple dietary modification options and treatment options were discussed and  [name] [name] agreed to portion control better and make smarter food choices, such as increase vegetables and decrease simple carbohydrates  We discussed the following Behavioral Modification Strategies today: holiday eating strategies and no skipping meals

## 2017-11-01 NOTE — Assessment & Plan Note (Signed)
She points to significant distress related to the age COPD of her daughter who has alpha-1 antitrypsin deficiency.  Not getting regular exercise no meaningful weight loss effort.  Encouragement offered.

## 2017-11-01 NOTE — Assessment & Plan Note (Signed)
chest CT follow-up June 2019

## 2017-11-01 NOTE — Assessment & Plan Note (Signed)
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  Plan-follow-up chest CT around June, 2019

## 2017-11-04 ENCOUNTER — Encounter (HOSPITAL_COMMUNITY)
Admission: RE | Admit: 2017-11-04 | Discharge: 2017-11-04 | Disposition: A | Discharge status: Home / Self Care | Payer: Medicare Other | Source: Ambulatory Visit | Attending: Internal Medicine | Admitting: Internal Medicine

## 2017-11-04 VITALS — Wt 260.6 lb

## 2017-11-04 DIAGNOSIS — I10 Essential (primary) hypertension: Secondary | ICD-10-CM | POA: Diagnosis not present

## 2017-11-04 DIAGNOSIS — Z950 Presence of cardiac pacemaker: Secondary | ICD-10-CM | POA: Insufficient documentation

## 2017-11-04 DIAGNOSIS — E669 Obesity, unspecified: Secondary | ICD-10-CM | POA: Insufficient documentation

## 2017-11-04 DIAGNOSIS — Z8673 Personal history of transient ischemic attack (TIA), and cerebral infarction without residual deficits: Secondary | ICD-10-CM | POA: Insufficient documentation

## 2017-11-04 DIAGNOSIS — Z6841 Body Mass Index (BMI) 40.0 and over, adult: Secondary | ICD-10-CM | POA: Diagnosis not present

## 2017-11-04 DIAGNOSIS — Z79899 Other long term (current) drug therapy: Secondary | ICD-10-CM | POA: Insufficient documentation

## 2017-11-04 DIAGNOSIS — Z7901 Long term (current) use of anticoagulants: Secondary | ICD-10-CM | POA: Insufficient documentation

## 2017-11-04 DIAGNOSIS — I4891 Unspecified atrial fibrillation: Secondary | ICD-10-CM | POA: Diagnosis not present

## 2017-11-04 DIAGNOSIS — I495 Sick sinus syndrome: Secondary | ICD-10-CM | POA: Insufficient documentation

## 2017-11-04 DIAGNOSIS — Z7951 Long term (current) use of inhaled steroids: Secondary | ICD-10-CM | POA: Diagnosis not present

## 2017-11-04 DIAGNOSIS — Z87891 Personal history of nicotine dependence: Secondary | ICD-10-CM | POA: Insufficient documentation

## 2017-11-04 DIAGNOSIS — I341 Nonrheumatic mitral (valve) prolapse: Secondary | ICD-10-CM | POA: Diagnosis not present

## 2017-11-04 DIAGNOSIS — M1990 Unspecified osteoarthritis, unspecified site: Secondary | ICD-10-CM | POA: Diagnosis not present

## 2017-11-04 DIAGNOSIS — R0609 Other forms of dyspnea: Secondary | ICD-10-CM | POA: Insufficient documentation

## 2017-11-04 DIAGNOSIS — K219 Gastro-esophageal reflux disease without esophagitis: Secondary | ICD-10-CM | POA: Diagnosis not present

## 2017-11-04 NOTE — Progress Notes (Signed)
Daily Session Note  Patient Details  Name: [name] [name] MRN: [id number] Date of Birth: 21-Sep-1941 Referring Provider:     Pulmonary Rehab Walk Test from 06/17/2017 in [place]  Referring Provider  Dr. Annamaria Boots      Encounter Date: 11/04/2017  Check In: Session Check In - 11/04/17 1330      Check-In   Location  [facility]    Staff Present  Su Hilt, MS, ACSM RCEP, Exercise Physiologist;Lisa Ysidro Evert, RN;Portia [place], RN, Maxcine Ham, RN, BSN    Supervising physician immediately available to respond to emergencies  [facility] immediately available    Physician(s)  Dr. [place] Crane    Medication changes reported      No    Fall or balance concerns reported     No    Tobacco Cessation  No Change    Warm-up and Cool-down  Performed as group-led instruction    Resistance Training Performed  Yes    VAD Patient?  No      Pain Assessment   Currently in Pain?  No/denies    Multiple Pain Sites  No       Capillary Blood Glucose: No results found for this or any previous visit (from the past 24 hour(s)).  Exercise Prescription Changes - 11/04/17 1500      Response to Exercise   Blood Pressure (Admit)  114/70    Blood Pressure (Exercise)  102/60    Blood Pressure (Exit)  138/80    Heart Rate (Admit)  84 bpm    Heart Rate (Exercise)  99 bpm    Heart Rate (Exit)  93 bpm    Oxygen Saturation (Admit)  96 %    Oxygen Saturation (Exercise)  94 %    Oxygen Saturation (Exit)  93 %    Rating of Perceived Exertion (Exercise)  13    Perceived Dyspnea (Exercise)  3    Duration  Progress to 45 minutes of aerobic exercise without signs/symptoms of physical distress    Intensity  THRR unchanged      Progression   Progression  Continue to progress workloads to maintain intensity without signs/symptoms of physical distress.      Resistance Training   Training Prescription  Yes    Weight  orange bands    Reps  10-15    Time  10  Minutes      Interval Training   Interval Training  No      NuStep   Level  6    Minutes  17    METs  2.3      Arm Ergometer   Level  5    Minutes  17      Track   Laps  7    Minutes  17       Social History   Tobacco Use  Smoking Status Former Smoker  . Packs/day: 2.00  . Years: 18.00  . Pack years: 36.00  . Types: Cigarettes  Smokeless Tobacco Never Used  Tobacco Comment   "quit smoking in 1971"    Goals Met:  Exercise tolerated well Strength training completed today  Goals Unmet:  Not Applicable  Comments: Service time is from 1330 to 1500    Dr. Rush Farmer is Medical Director for Pulmonary Rehab at [facility].

## 2017-11-05 ENCOUNTER — Ambulatory Visit (INDEPENDENT_AMBULATORY_CARE_PROVIDER_SITE_OTHER): Payer: Medicare Other | Admitting: Family Medicine

## 2017-11-05 VITALS — BP 114/75 | HR 75 | Temp 97.5°F | Ht 67.0 in | Wt 256.0 lb

## 2017-11-05 DIAGNOSIS — R7303 Prediabetes: Secondary | ICD-10-CM

## 2017-11-05 DIAGNOSIS — Z6841 Body Mass Index (BMI) 40.0 and over, adult: Secondary | ICD-10-CM

## 2017-11-05 DIAGNOSIS — E8881 Metabolic syndrome: Secondary | ICD-10-CM | POA: Insufficient documentation

## 2017-11-05 DIAGNOSIS — S0990XD Unspecified injury of head, subsequent encounter: Secondary | ICD-10-CM

## 2017-11-05 NOTE — Progress Notes (Signed)
Office: [phone]  /  Fax: [phone]   HPI:   Chief Complaint: OBESITY [name] [name] is here to discuss her progress with her obesity treatment plan. She is on the portion control better and make smarter food choices, such as increase vegetables and decrease simple carbohydrates  and is following her eating plan approximately 75 % of the time. She states she is exercising pulmonary rehab for 45 minutes 2 times per week. [name] [name] continues to do well with weight loss, even over Thanksgiving. She sometimes skips meals and has had friends and family bring her food since her daughter died. Her weight is 256 lb (116.1 kg) today and has had a weight loss of 3 pounds over a period of 2 weeks since her last visit. She has lost 11 lbs since starting treatment with [place].  Traumatic Injury of Head, subsequent encounter (#2) [name] [name] fell 10 days ago and was seen by her PCP. She has residual bilateral subocular ecchymosis, but extra occular muscles intact bilaterally and no ataxia or slurred speech. CT showed nasal bone fracture.  ALLERGIES: Allergies  Allergen Reactions   Ancef [Cefazolin] Hives and Itching   Pseudoephedrine Hcl     Other reaction(s): palpitations (moderate)   Caffeine Palpitations    MEDICATIONS: Current Outpatient Medications on File Prior to Visit  Medication Sig Dispense Refill   apixaban (ELIQUIS) 5 MG TABS tablet Take 1 tablet (5 mg total) by mouth 2 (two) times daily. 28 tablet 0   Biotin 5000 MCG CAPS Take 5,000 mcg by mouth daily.      Calcium Carbonate-Vit D-Min (CALCIUM 1200 PO) Take 2,400 mg by mouth daily.      cholecalciferol (VITAMIN D) 1000 UNITS tablet Take 1,000 Units by mouth daily.      Co-Enzyme Q10 100 MG CAPS Take 100 mg by mouth daily.      diazepam (VALIUM) 5 MG tablet TAKE 1 TABLET BY MOUTH ONCE DAILY AS NEEDED FOR ANXIETY 30 tablet 1   folic acid (FOLVITE) 564 MCG tablet Take 400 mcg by mouth daily.     glucosamine-chondroitin 500-400 MG tablet  Take 1 tablet by mouth daily.      loratadine (KLS ALLERCLEAR) 10 MG tablet Take 10 mg by mouth daily.     MAGNESIUM CITRATE PO Take 250 mg by mouth daily. 1 tab daily     metoprolol tartrate (LOPRESSOR) 25 MG tablet Take 0.5 tablets (12.5 mg total) by mouth 2 (two) times daily. 90 tablet 3   nitroGLYCERIN (NITROSTAT) 0.4 MG SL tablet Place 0.4 mg under the tongue every 5 (five) minutes as needed for chest pain.     pantoprazole (PROTONIX) 40 MG tablet TAKE 1 TABLET BY MOUTH DAILY 90 tablet 1   Resveratrol 250 MG CAPS Take 250 mg by mouth daily.     sertraline (ZOLOFT) 100 MG tablet Take 1 tablet (100 mg total) by mouth daily. 90 tablet 0   Simethicone (GAS-X PO) Take 2 tablets by mouth daily as needed (bloating).     No current facility-administered medications on file prior to visit.     PAST MEDICAL HISTORY: Past Medical History:  Diagnosis Date   Alpha-1-antitrypsin deficiency carrier [facility])    Arthritis    "all over"   Atrial fibrillation ([place])    Back pain    Complication of anesthesia    "I woke up during 2 different procedures" (10/04/2014)   Frequent UTI    GERD (gastroesophageal reflux disease)    Hypertension    MVP (mitral  valve prolapse)    Obesity    Osteoarthritis    Presence of permanent cardiac pacemaker    PVC's (premature ventricular contractions)    Shortness of breath    Sinus arrest 10/2014   s/p Medtronic Advisa model A2DR01 serial number [id number]   Sleep apnea    Stroke ([place]) 01/2014   denies deficits on 10/04/2014   Tachy-brady syndrome ([place]) 10/2014   TIA (transient ischemic attack)     PAST SURGICAL HISTORY: Past Surgical History:  Procedure Laterality Date   CARDIOVERSION N/A 09/02/2017   Procedure: CARDIOVERSION;  Surgeon: Sanda Klein, MD;  Location: [facility];  Service: Cardiovascular;  Laterality: N/A;   EXCISION VAGINAL CYST     benign nodules   INSERT / REPLACE / REMOVE PACEMAKER  10/04/2014   Medtronic  Advisa model A2DR01 serial number [id number]   LOOP RECORDER EXPLANT N/A 10/04/2014   Procedure: LOOP RECORDER EXPLANT;  Surgeon: Sanda Klein, MD;  Location: [facility];  Service: Cardiovascular;  Laterality: N/A;   LOOP RECORDER IMPLANT N/A 04/19/2014   Procedure: LOOP RECORDER IMPLANT;  Surgeon: Sanda Klein, MD;  Location: [place] [facility];  Service: Cardiovascular;  Laterality: N/A;   NM MYOCAR PERF WALL MOTION  12/18/2007   normal   PERMANENT PACEMAKER INSERTION N/A 10/04/2014   Procedure: PERMANENT PACEMAKER INSERTION;  Surgeon: Sanda Klein, MD;  Location: [facility];  Service: Cardiovascular;  Laterality: N/A;   TUBAL LIGATION     US ECHOCARDIOGRAPHY  05/15/2010   LA mildly dilated,mild mitral annular ca+, AOV mildly sclerotic, mild asymmetric LVH    SOCIAL HISTORY: Social History   Tobacco Use   Smoking status: Former Smoker    Packs/day: 2.00    Years: 18.00    Pack years: 36.00    Types: Cigarettes   Smokeless tobacco: Never Used   Tobacco comment: "quit smoking in 1971"  Substance Use Topics   Alcohol use: Yes    Alcohol/week: 0.0 oz    Comment: 10/04/2014 "might have a drink a couple times/yr"   Drug use: No    FAMILY HISTORY: Family History  Problem Relation Age of Onset   Heart attack Mother    Sudden death Mother    Depression Mother    Stroke Father    Heart failure Father    Depression Father    Heart failure Brother    Stroke Brother     ROS: Review of Systems  Constitutional: Positive for weight loss.    PHYSICAL EXAM: Blood pressure 114/75, pulse 75, temperature (!) 97.5 F (36.4 C), temperature source Oral, height _0  (1.702 m), weight 256 lb (116.1 kg), SpO2 95 %. Body mass index is 40.1 kg/m. Physical Exam  Constitutional: She is oriented to person, place, and time. She appears well-developed.  Cardiovascular: Normal rate.  Pulmonary/Chest: Effort normal.  Musculoskeletal: Normal range of motion.  Neurological: She  is oriented to person, place, and time.  Skin: Skin is warm and dry.  Psychiatric: She has a normal mood and affect. Her behavior is normal.  Vitals reviewed.   RECENT LABS AND TESTS: BMET    Component Value Date/Time   NA 139 09/17/2017 1131   K 4.2 09/17/2017 1131   CL 102 09/17/2017 1131   CO2 21 09/17/2017 1131   GLUCOSE 97 09/17/2017 1131   GLUCOSE 92 01/15/2017 1417   BUN 14 09/17/2017 1131   CREATININE 1.07 (H) 09/17/2017 1131   CREATININE 0.86 09/28/2014 1557   CALCIUM 8.9 09/17/2017 1131  GFRNONAA 51 (L) 09/17/2017 1131   GFRNONAA 56 (L) 06/06/2014 1653   GFRAA 58 (L) 09/17/2017 1131   GFRAA 65 06/06/2014 1653   Lab Results  Component Value Date   HGBA1C 6.2 (H) 09/17/2017   HGBA1C 5.9 (H) 03/09/2014   Lab Results  Component Value Date   INSULIN 47.7 (H) 09/17/2017   CBC    Component Value Date/Time   WBC 8.5 09/17/2017 1131   WBC 9.1 02/24/2017 1555   RBC 5.22 09/17/2017 1131   RBC 5.09 02/24/2017 1555   HGB 14.9 09/17/2017 1131   HCT 45.1 09/17/2017 1131   PLT 258 08/27/2017 1509   MCV 86 09/17/2017 1131   MCH 28.5 09/17/2017 1131   MCH 29.0 09/28/2014 1557   MCHC 33.0 09/17/2017 1131   MCHC 33.9 02/24/2017 1555   RDW 13.5 09/17/2017 1131   LYMPHSABS 2.0 09/17/2017 1131   MONOABS 1.3 (H) 02/24/2017 1555   EOSABS 0.2 09/17/2017 1131   BASOSABS 0.0 09/17/2017 1131   Iron/TIBC/Ferritin/ %Sat No results found for: IRON, TIBC, FERRITIN, IRONPCTSAT Lipid Panel     Component Value Date/Time   CHOL 164 09/17/2017 1131   TRIG 117 09/17/2017 1131   HDL 47 09/17/2017 1131   CHOLHDL 3 01/15/2017 1417   VLDL 33.6 01/15/2017 1417   LDLCALC 94 09/17/2017 1131   LDLDIRECT 81.0 07/15/2016 1419   Hepatic Function Panel     Component Value Date/Time   PROT 6.8 09/17/2017 1131   ALBUMIN 3.8 09/17/2017 1131   AST 30 09/17/2017 1131   ALT 16 09/17/2017 1131   ALKPHOS 81 09/17/2017 1131   BILITOT 0.4 09/17/2017 1131   BILIDIR 0.1 01/15/2017 1417       Component Value Date/Time   TSH 2.040 09/17/2017 1131   TSH 2.70 07/15/2016 1419   TSH 1.529 06/06/2014 1608    ASSESSMENT AND PLAN: Prediabetes  Traumatic injury of head, subsequent encounter  Class 3 severe obesity with serious comorbidity and body mass index (BMI) of 40.0 to 44.9 in adult, unspecified obesity type ([place])  PLAN:  Traumatic Injury of Head, subsequent encounter (#2) [name] [name] was reassured, she appeared to be healing normally and was encouraged to be especially careful with ambulation, especially since she is still slightly unsteady when she stands up from a seated position. [name] [name] agrees to follow up with our clinic in 2 weeks.  We spent > than 50% of the 15 minute visit on the counseling as documented in the note.  Obesity [name] [name] is currently in the action stage of change. As such, her goal is to continue with weight loss efforts She has agreed to follow the Category 2 plan [name] [name] has been instructed to work up to a goal of 150 minutes of combined cardio and strengthening exercise per week for weight loss and overall health benefits. We discussed the following Behavioral Modification Strategies today: dealing with family or coworker sabotage and no skipping meals Back to category 2 plan when she is able. We discussed ways to redistribute food, given to her, that she doesn't want or need.  [name] [name] has agreed to follow up with our clinic in 2 weeks. She was informed of the importance of frequent follow up visits to maximize her success with intensive lifestyle modifications for her multiple health conditions.  I, Doreene Nest, am acting as transcriptionist for Dennard Nip, MD  I have reviewed the above documentation for accuracy and completeness, and I agree with the above. -Dennard Nip, MD   OBESITY BEHAVIORAL INTERVENTION  VISIT  Today's visit was # 4 out of 22.  Starting weight: 267 lbs Starting date: 09/17/17 Today's weight : 256 lbs Today's date:  11/05/2017 Total lbs lost to date: 68 (Patients must lose 7 lbs in the first 6 months to continue with counseling)   ASK: We discussed the diagnosis of obesity with [name] [name] today and [name] [name] agreed to give [place] permission to discuss obesity behavioral modification therapy today.  ASSESS: [name] [name] has the diagnosis of obesity and her BMI today is 40.09 [name] [name] is in the action stage of change   ADVISE: [name] [name] was educated on the multiple health risks of obesity as well as the benefit of weight loss to improve her health. She was advised of the need for long term treatment and the importance of lifestyle modifications.  AGREE: Multiple dietary modification options and treatment options were discussed and  [name] [name] agreed to follow the Category 2 plan We discussed the following Behavioral Modification Strategies today: dealing with family or coworker sabotage and no skipping meals

## 2017-11-06 ENCOUNTER — Encounter (HOSPITAL_COMMUNITY)
Admission: RE | Admit: 2017-11-06 | Discharge: 2017-11-06 | Disposition: A | Discharge status: Home / Self Care | Payer: Medicare Other | Source: Ambulatory Visit | Attending: Internal Medicine | Admitting: Internal Medicine

## 2017-11-06 VITALS — Wt 259.0 lb

## 2017-11-06 DIAGNOSIS — Z7951 Long term (current) use of inhaled steroids: Secondary | ICD-10-CM | POA: Diagnosis not present

## 2017-11-06 DIAGNOSIS — R0609 Other forms of dyspnea: Principal | ICD-10-CM

## 2017-11-06 DIAGNOSIS — Z79899 Other long term (current) drug therapy: Secondary | ICD-10-CM | POA: Diagnosis not present

## 2017-11-06 DIAGNOSIS — Z950 Presence of cardiac pacemaker: Secondary | ICD-10-CM | POA: Diagnosis not present

## 2017-11-06 DIAGNOSIS — Z7901 Long term (current) use of anticoagulants: Secondary | ICD-10-CM | POA: Diagnosis not present

## 2017-11-06 DIAGNOSIS — M1990 Unspecified osteoarthritis, unspecified site: Secondary | ICD-10-CM | POA: Diagnosis not present

## 2017-11-06 NOTE — Progress Notes (Signed)
Pulmonary Individual Treatment Plan  Patient Details  Name: [name] [name] MRN: [id number] Date of Birth: 02/03/1941 Referring Provider:     Pulmonary Rehab Walk Test from 06/17/2017 in [place]  Referring Provider  Dr. Annamaria Boots      Initial Encounter Date:    Pulmonary Rehab Walk Test from 06/17/2017 in [place]  Date  06/19/17  Referring Provider  Dr. Annamaria Boots      Visit Diagnosis: Dyspnea on exertion  Patient's Home Medications on Admission:   Current Outpatient Medications:  .  apixaban (ELIQUIS) 5 MG TABS tablet, Take 1 tablet (5 mg total) by mouth 2 (two) times daily., Disp: 28 tablet, Rfl: 0 .  Biotin 5000 MCG CAPS, Take 5,000 mcg by mouth daily. , Disp: , Rfl:  .  Calcium Carbonate-Vit D-Min (CALCIUM 1200 PO), Take 2,400 mg by mouth daily. , Disp: , Rfl:  .  cholecalciferol (VITAMIN D) 1000 UNITS tablet, Take 1,000 Units by mouth daily. , Disp: , Rfl:  .  Co-Enzyme Q10 100 MG CAPS, Take 100 mg by mouth daily. , Disp: , Rfl:  .  diazepam (VALIUM) 5 MG tablet, TAKE 1 TABLET BY MOUTH ONCE DAILY AS NEEDED FOR ANXIETY, Disp: 30 tablet, Rfl: 1 .  folic acid (FOLVITE) 403 MCG tablet, Take 400 mcg by mouth daily., Disp: , Rfl:  .  glucosamine-chondroitin 500-400 MG tablet, Take 1 tablet by mouth daily. , Disp: , Rfl:  .  loratadine (KLS ALLERCLEAR) 10 MG tablet, Take 10 mg by mouth daily., Disp: , Rfl:  .  MAGNESIUM CITRATE PO, Take 250 mg by mouth daily. 1 tab daily, Disp: , Rfl:  .  metoprolol tartrate (LOPRESSOR) 25 MG tablet, Take 0.5 tablets (12.5 mg total) by mouth 2 (two) times daily., Disp: 90 tablet, Rfl: 3 .  nitroGLYCERIN (NITROSTAT) 0.4 MG SL tablet, Place 0.4 mg under the tongue every 5 (five) minutes as needed for chest pain., Disp: , Rfl:  .  pantoprazole (PROTONIX) 40 MG tablet, TAKE 1 TABLET BY MOUTH DAILY, Disp: 90 tablet, Rfl: 1 .  Resveratrol 250 MG CAPS, Take 250 mg by mouth daily., Disp: , Rfl:  .   sertraline (ZOLOFT) 100 MG tablet, Take 1 tablet (100 mg total) by mouth daily., Disp: 90 tablet, Rfl: 0 .  Simethicone (GAS-X PO), Take 2 tablets by mouth daily as needed (bloating)., Disp: , Rfl:   Past Medical History: Past Medical History:  Diagnosis Date  . Alpha-1-antitrypsin deficiency carrier ([place])   . Arthritis    "all over"  . Atrial fibrillation ([place])   . Back pain   . Complication of anesthesia    "I woke up during 2 different procedures" (10/04/2014)  . Frequent UTI   . GERD (gastroesophageal reflux disease)   . Hypertension   . MVP (mitral valve prolapse)   . Obesity   . Osteoarthritis   . Presence of permanent cardiac pacemaker   . PVC's (premature ventricular contractions)   . Shortness of breath   . 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 Sleep apnea   . Stroke [facility]) 01/2014   denies deficits on 10/04/2014  . Tachy-brady syndrome ([place]) 10/2014  . TIA (transient ischemic attack)     Tobacco Use: Social History   Tobacco Use  Smoking Status Former Smoker  . Packs/day: 2.00  . Years: 18.00  . Pack years: 36.00  . Types: Cigarettes  Smokeless  Tobacco Never Used  Tobacco Comment   "quit smoking in 1971"    Labs: Recent Review Flowsheet Data    Labs for ITP Cardiac and Pulmonary Rehab Latest Ref Rng & Units 03/09/2014 10/25/2014 07/15/2016 01/15/2017 09/17/2017   Cholestrol 100 - 199 mg/dL 152 184 163 182 164   LDLCALC 0 - 99 mg/dL 77 138 - 94 94   LDLDIRECT mg/dL - - 81.0 - -   HDL >39 mg/dL 58 46 45.70 54.60 47   Trlycerides 0 - 149 mg/dL 87 210(A) 208.0(H) 168.0(H) 117   Hemoglobin A1c 4.8 - 5.6 % 5.9(H) - - - 6.2(H)   TCO2 0 - 100 mmol/L - - - - -      Capillary Blood Glucose: Lab Results  Component Value Date   GLUCAP 103 (H) 03/09/2014   GLUCAP 103 (H) 03/09/2014   GLUCAP 112 (H) 03/09/2014   POCT Glucose    Row Name 07/15/17 1555             POCT Blood Glucose   Pre-Exercise  220 mg/dL        Post-Exercise  165 mg/dL          Pulmonary Assessment Scores: Pulmonary Assessment Scores    Row Name 06/19/17 0728         ADL UCSD   ADL Phase  Entry       mMRC Score   mMRC Score  4        Pulmonary Function Assessment: Pulmonary Function Assessment - 06/09/17 1315      Breath   Bilateral Breath Sounds  Clear    Shortness of Breath  Yes;Limiting activity;Fear of Shortness of Breath       Exercise Target Goals:    Exercise Program Goal: Individual exercise prescription set with THRR, safety & activity barriers. Participant demonstrates ability to understand and report RPE using BORG scale, to self-measure pulse accurately, and to acknowledge the importance of the exercise prescription.  Exercise Prescription Goal: Starting with aerobic activity 30 plus minutes a day, 3 days per week for initial exercise prescription. Provide home exercise prescription and guidelines that participant acknowledges understanding prior to discharge.  Activity Barriers & Risk Stratification:   6 Minute Walk: 6 Minute Walk    Row Name 06/19/17 0723         6 Minute Walk   Phase  Initial     Distance  800 feet     Walk Time  - 4 minutes 50 seconds total     # of Rest Breaks  2 1st: 40 seconds 2nd: 30 seconds     MPH  1.5     METS  2.15     RPE  13     Perceived Dyspnea   3     Symptoms  Yes (comment)     Comments  used wheelchair/5/10 hip pain/limited by shortness of breath     Resting HR  90 bpm     Resting BP  105/66     Max Ex. HR  127 bpm     Max Ex. BP  105/65       Interval HR   Baseline HR (retired)  90     1 Minute HR  144     2 Minute HR  127     3 Minute HR  102     4 Minute HR  98     5 Minute HR  98     6 Minute  HR  102     2 Minute Post HR  95     Interval Heart Rate?  Yes       Interval Oxygen   Interval Oxygen?  Yes     Baseline Oxygen Saturation %  94 %     Resting Liters of Oxygen  0 L     1 Minute Oxygen Saturation %  93 %     1 Minute  Liters of Oxygen  0 L     2 Minute Oxygen Saturation %  93 %     2 Minute Liters of Oxygen  0 L     3 Minute Oxygen Saturation %  93 %     3 Minute Liters of Oxygen  0 L     4 Minute Oxygen Saturation %  94 %     4 Minute Liters of Oxygen  0 L     5 Minute Oxygen Saturation %  92 %     5 Minute Liters of Oxygen  0 L     6 Minute Oxygen Saturation %  92 %     6 Minute Liters of Oxygen  0 L     2 Minute Post Oxygen Saturation %  93 %     2 Minute Post Liters of Oxygen  0 L        Oxygen Initial Assessment: Oxygen Initial Assessment - 06/19/17 0726      Initial 6 min Walk   Oxygen Used  None    Resting Oxygen Saturation   94 %    Exercise Oxygen Saturation  during 6 min walk  92 %      Program Oxygen Prescription   Program Oxygen Prescription  None       Oxygen Re-Evaluation: Oxygen Re-Evaluation    Row Name 07/01/17 1924 07/30/17 2103 08/22/17 1357 09/22/17 0834 10/14/17 1543     Program Oxygen Prescription   Program Oxygen Prescription  None  None  None  None  None     Home Oxygen   Home Oxygen Device  None  None  None  None  None   Sleep Oxygen Prescription  CPAP  CPAP  CPAP  CPAP  CPAP   Liters per minute  0  -  -  0  -   Home Exercise Oxygen Prescription  None  None  None  None  None   Home at Rest Exercise Oxygen Prescription  None  None  None  None  None   Compliance with Home Oxygen Use  Yes  -  -  Yes  Yes   Row Name 11/04/17 1549             Program Oxygen Prescription   Program Oxygen Prescription  None         Home Oxygen   Home Oxygen Device  None       Sleep Oxygen Prescription  CPAP       Home Exercise Oxygen Prescription  None       Home at Rest Exercise Oxygen Prescription  None       Compliance with Home Oxygen Use  Yes          Oxygen Discharge (Final Oxygen Re-Evaluation): Oxygen Re-Evaluation - 11/04/17 1549      Program Oxygen Prescription   Program Oxygen Prescription  None      Home Oxygen   Home Oxygen Device  None    Sleep  Oxygen Prescription  CPAP  Home Exercise Oxygen Prescription  None    Home at Rest Exercise Oxygen Prescription  None    Compliance with Home Oxygen Use  Yes       Initial Exercise Prescription: Initial Exercise Prescription - 06/19/17 0700      Date of Initial Exercise RX and Referring Provider   Date  06/19/17    Referring Provider  Dr. Annamaria Boots      Recumbant Bike   Level  1    Minutes  17      NuStep   Level  2    Minutes  17    METs  1.5      Track   Laps  4    Minutes  17      Prescription Details   Frequency (times per week)  2    Duration  Progress to 45 minutes of aerobic exercise without signs/symptoms of physical distress      Intensity   THRR 40-80% of Max Heartrate  58-115    Ratings of Perceived Exertion  11-13    Perceived Dyspnea  0-4      Progression   Progression  Continue progressive overload as per policy without signs/symptoms or physical distress.      Resistance Training   Training Prescription  Yes    Weight  orange bands    Reps  10-15       Perform Capillary Blood Glucose checks as needed.  Exercise Prescription Changes: Exercise Prescription Changes    Row Name 06/24/17 1500 06/26/17 1500 07/01/17 1500 07/03/17 1500 07/08/17 1500     Response to Exercise   Blood Pressure (Admit)  104/60  120/60  100/56  96/60  110/64   Blood Pressure (Exercise)  124/70  130/84  122/82  130/86  118/70   Blood Pressure (Exit)  100/60  108/74  104/60  126/60  108/78   Heart Rate (Admit)  79 bpm  78 bpm  97 bpm  79 bpm  72 bpm   Heart Rate (Exercise)  105 bpm  94 bpm  107 bpm  105 bpm  94 bpm   Heart Rate (Exit)  83 bpm  70 bpm  79 bpm  71 bpm  76 bpm   Oxygen Saturation (Admit)  94 %  94 %  96 %  96 %  94 %   Oxygen Saturation (Exercise)  94 %  93 %  93 %  87 % Sat increased to 92% with rest  93 % Sat increased to 92% with rest   Oxygen Saturation (Exit)  93 %  95 %  94 %  93 %  96 %   Rating of Perceived Exertion (Exercise)  _0 Perceived Dyspnea (Exercise)  _1 Duration  Progress to 45 minutes of aerobic exercise without signs/symptoms of physical distress  Progress to 45 minutes of aerobic exercise without signs/symptoms of physical distress  Progress to 45 minutes of aerobic exercise without signs/symptoms of physical distress  Progress to 45 minutes of aerobic exercise without signs/symptoms of physical distress  Progress to 45 minutes of aerobic exercise without signs/symptoms of physical distress   Intensity  Other (comment) 40-8-% of HRR  THRR unchanged  THRR unchanged  THRR unchanged  THRR unchanged     Progression   Progression  Continue to progress workloads to maintain intensity without signs/symptoms of physical distress.  Continue  to progress workloads to maintain intensity without signs/symptoms of physical distress.  Continue to progress workloads to maintain intensity without signs/symptoms of physical distress.  Continue to progress workloads to maintain intensity without signs/symptoms of physical distress.  Continue to progress workloads to maintain intensity without signs/symptoms of physical distress.     Resistance Training   Training Prescription  Yes  Yes  Yes  Yes  Yes   Weight  orange bands  orange bands  orange bands  orange bands  orange bands   Reps  10-15  10-15  10-15  10-15  10-15   Time  10 Minutes  10 Minutes  10 Minutes  10 Minutes  10 Minutes     NuStep   Level  _0 -  2   Minutes  _1 -  17   METs  1.9  1.1  1.9  -  1.8     Arm Ergometer   Level  _2 Minutes  _3 Track   Laps  5  -  _4 Minutes  17  -  _5 Row Name 07/10/17 1500 07/15/17 1500 07/17/17 1500 07/24/17 1605 07/31/17 1600     Response to Exercise   Blood Pressure (Admit)  96/64  94/60  90/50  104/70  110/62   Blood Pressure (Exercise)  112/86  110/74  98/50  108/64  114/60   Blood Pressure (Exit)  100/60  100/64  100/60  104/60  98/0    Heart Rate (Admit)  75 bpm  70 bpm  80 bpm  74 bpm  80 bpm   Heart Rate (Exercise)  88 bpm  77 bpm  94 bpm  88 bpm  88 bpm   Heart Rate (Exit)  74 bpm  92 bpm  75 bpm  86 bpm  83 bpm   Oxygen Saturation (Admit)  96 %  93 %  96 %  93 %  94 %   Oxygen Saturation (Exercise)  95 %  94 %  90 %  94 %  95 %   Oxygen Saturation (Exit)  96 %  96 %  95 %  96 %  93 %   Rating of Perceived Exertion (Exercise)  _6 Perceived Dyspnea (Exercise)  _7 Duration  Progress to 45 minutes of aerobic exercise without signs/symptoms of physical distress  Progress to 45 minutes of aerobic exercise without signs/symptoms of physical distress  Progress to 45 minutes of aerobic exercise without signs/symptoms of physical distress  Progress to 45 minutes of aerobic exercise without signs/symptoms of physical distress  Progress to 45 minutes of aerobic exercise without signs/symptoms of physical distress   Intensity  THRR unchanged  THRR unchanged  THRR unchanged  THRR unchanged  THRR unchanged     Progression   Progression  Continue to progress workloads to maintain intensity without signs/symptoms of physical distress.  Continue to progress workloads to maintain intensity without signs/symptoms of physical distress.  Continue to progress workloads to maintain intensity without signs/symptoms of physical distress.  Continue to progress workloads to maintain intensity without signs/symptoms of physical distress.  Continue to progress workloads to maintain intensity without  signs/symptoms of physical distress.     Resistance Training   Training Prescription  Yes  Yes  Yes  Yes  Yes   Weight  orange bands  orange bands  orange bands  orange bands  orange bands   Reps  10-15  10-15  10-15  10-15  10-15   Time  10 Minutes  10 Minutes  10 Minutes  10 Minutes  10 Minutes     NuStep   Level  3  4  -  4  5   Minutes  17  17  -  17  17   METs  2.4  2  -  1.4  1.7     Arm Ergometer   Level  3  3  -   5  6   Minutes  17  17  -  17  17   Row Name 08/05/17 1500 08/12/17 1559 08/14/17 1500 08/14/17 1546 08/19/17 1555     Response to Exercise   Blood Pressure (Admit)  100/60  100/62  -  98/62  98/62   Blood Pressure (Exercise)  122/66  104/62  -  98/56  97/67   Blood Pressure (Exit)  110/60  104/64  -  100/54  114/74   Heart Rate (Admit)  76 bpm  70 bpm  -  77 bpm  68 bpm   Heart Rate (Exercise)  94 bpm  99 bpm  -  86 bpm  100 bpm   Heart Rate (Exit)  65 bpm  70 bpm  -  82 bpm  90 bpm   Oxygen Saturation (Admit)  96 %  95 %  -  95 %  94 %   Oxygen Saturation (Exercise)  94 %  96 %  -  95 %  94 %   Oxygen Saturation (Exit)  91 %  95 %  -  96 %  94 %   Rating of Perceived Exertion (Exercise)  14  13  -  12  14   Perceived Dyspnea (Exercise)  3  3  -  2  3   Duration  Progress to 45 minutes of aerobic exercise without signs/symptoms of physical distress  Progress to 45 minutes of aerobic exercise without signs/symptoms of physical distress  -  Progress to 45 minutes of aerobic exercise without signs/symptoms of physical distress  Progress to 45 minutes of aerobic exercise without signs/symptoms of physical distress   Intensity  THRR unchanged  THRR unchanged  -  THRR unchanged  THRR unchanged     Progression   Progression  Continue to progress workloads to maintain intensity without signs/symptoms of physical distress.  Continue to progress workloads to maintain intensity without signs/symptoms of physical distress.  -  Continue to progress workloads to maintain intensity without signs/symptoms of physical distress.  Continue to progress workloads to maintain intensity without signs/symptoms of physical distress.     Resistance Training   Training Prescription  Yes  Yes  -  Yes  Yes   Weight  orange bands  orange bands  -  orange bands  orange bands   Reps  10-15  10-15  -  10-15  10-15   Time  10 Minutes  10 Minutes  -  10 Minutes  10 Minutes     NuStep   Level  5  5  -  5  5   Minutes  17   17  -  51  17   METs  2.2  1.9  -  2  2.1     Arm Ergometer   Level  6.5  6.5  -  -  6   Minutes  17  17  -  -  17     Track   Laps  -  -  -  -  8   Minutes  -  -  -  -  17     Home Exercise Plan   Plans to continue exercise at  [facility] (comment) water treading at [employer] and walking at home  -  -   Frequency  -  -  Add 2 additional days to program exercise sessions.  -  -   Row Name 08/21/17 1600 08/26/17 1500 08/28/17 1500 09/09/17 1500 09/16/17 1529     Response to Exercise   Blood Pressure (Admit)  92/68  104/64  100/60  96/64  144/64   Blood Pressure (Exercise)  112/62  110/60  130/74  110/66  120/74   Blood Pressure (Exit)  100/80  118/62  98/64  104/60  126/60   Heart Rate (Admit)  80 bpm  83 bpm  71 bpm  64 bpm  66 bpm   Heart Rate (Exercise)  98 bpm  110 bpm  88 bpm  94 bpm  88 bpm   Heart Rate (Exit)  86 bpm  78 bpm  69 bpm  87 bpm  61 bpm   Oxygen Saturation (Admit)  96 %  95 %  96 %  96 %  95 %   Oxygen Saturation (Exercise)  95 %  93 %  95 %  93 %  94 %   Oxygen Saturation (Exit)  96 %  95 %  94 %  93 %  91 %   Rating of Perceived Exertion (Exercise)  _0 Perceived Dyspnea (Exercise)  _1 Duration  Progress to 45 minutes of aerobic exercise without signs/symptoms of physical distress  Progress to 45 minutes of aerobic exercise without signs/symptoms of physical distress  Progress to 45 minutes of aerobic exercise without signs/symptoms of physical distress  Progress to 45 minutes of aerobic exercise without signs/symptoms of physical distress  Progress to 45 minutes of aerobic exercise without signs/symptoms of physical distress   Intensity  THRR unchanged  THRR unchanged  THRR unchanged  THRR unchanged  THRR unchanged     Progression   Progression  Continue to progress workloads to maintain intensity without signs/symptoms of physical distress.  Continue to progress workloads to maintain intensity without signs/symptoms of  physical distress.  Continue to progress workloads to maintain intensity without signs/symptoms of physical distress.  Continue to progress workloads to maintain intensity without signs/symptoms of physical distress.  Continue to progress workloads to maintain intensity without signs/symptoms of physical distress.     Resistance Training   Training Prescription  Yes  Yes  Yes  Yes  Yes   Weight  orange bands  orange bands  orange bands  orange bands  orange bands   Reps  10-15  10-15  10-15  10-15  10-15   Time  10 Minutes  10 Minutes  10 Minutes  10 Minutes  10 Minutes     NuStep   Level  _2 Minutes  39  76  73  17  34   METs  1.2  2.3  2  2.2  2.2     Arm Ergometer   Level  6  6  6.5  -  -   Minutes  _0 -  -     Track   Laps  -  9  -  12  12   Minutes  -  17  -  17  17   Row Name 09/23/17 1500 09/30/17 1500 10/02/17 1613 10/21/17 1500 11/04/17 1500     Response to Exercise   Blood Pressure (Admit)  126/84  106/60  142/80  120/80  114/70   Blood Pressure (Exercise)  104/60  122/70  140/66  100/66  102/60   Blood Pressure (Exit)  100/70  102/66  124/80  90/46  138/80   Heart Rate (Admit)  71 bpm  68 bpm  87 bpm  73 bpm  84 bpm   Heart Rate (Exercise)  94 bpm  96 bpm  104 bpm  96 bpm  99 bpm   Heart Rate (Exit)  64 bpm  66 bpm  59 bpm  67 bpm  93 bpm   Oxygen Saturation (Admit)  95 %  96 %  92 %  94 %  96 %   Oxygen Saturation (Exercise)  93 %  94 %  95 %  94 %  94 %   Oxygen Saturation (Exit)  95 %  92 %  96 %  96 %  93 %   Rating of Perceived Exertion (Exercise)  _1 Perceived Dyspnea (Exercise)  _2 Duration  Progress to 45 minutes of aerobic exercise without signs/symptoms of physical distress  Progress to 45 minutes of aerobic exercise without signs/symptoms of physical distress  Progress to 45 minutes of aerobic exercise without signs/symptoms of physical distress  Progress to 45 minutes of aerobic exercise without  signs/symptoms of physical distress  Progress to 45 minutes of aerobic exercise without signs/symptoms of physical distress   Intensity  THRR unchanged  THRR unchanged  THRR unchanged  THRR unchanged  THRR unchanged     Progression   Progression  Continue to progress workloads to maintain intensity without signs/symptoms of physical distress.  Continue to progress workloads to maintain intensity without signs/symptoms of physical distress.  Continue to progress workloads to maintain intensity without signs/symptoms of physical distress.  Continue to progress workloads to maintain intensity without signs/symptoms of physical distress.  Continue to progress workloads to maintain intensity without signs/symptoms of physical distress.     Resistance Training   Training Prescription  Yes  Yes  Yes  Yes  Yes   Weight  orange bands  orange bands  orange bands  orange bands  orange bands   Reps  10-15  10-15  10-15  10-15  10-15   Time  10 Minutes  10 Minutes  10 Minutes  10 Minutes  10 Minutes     Interval Training   Interval Training  -  No  No  No  No     NuStep   Level  -  _3 Minutes  -  _4 METs  -  2.3  2.1  3.2  2.3     Arm Ergometer   Level  6.5  6.5  5 workload reduced related to level 6.5 causes shoulder pain  5 workload reduced related to level 6.5 causes shoulder pain  5   Minutes  34  _0 Track   Laps  13  11  -  11  7   Minutes  17  17  -  17  17      Exercise Comments: Exercise Comments    Row Name 08/14/17 1530           Exercise Comments  home exercise completed          Exercise Goals and Review:   Exercise Goals Re-Evaluation : Exercise Goals Re-Evaluation    [place] Name 06/27/17 1042 07/29/17 1005 08/18/17 1402 09/20/17 1337 10/13/17 1638     Exercise Goal Re-Evaluation   Exercise Goals Review  Increase Physical Activity;Increase Strenth and Stamina  Increase Physical Activity;Increase Strength and Stamina;Able to  understand and use Dyspnea scale;Able to understand and use rate of perceived exertion (RPE) scale;Knowledge and understanding of Target Heart Rate Range (THRR);Understanding of Exercise Prescription  Increase Strength and Stamina;Increase Physical Activity;Able to understand and use Dyspnea scale;Able to understand and use rate of perceived exertion (RPE) scale;Knowledge and understanding of Target Heart Rate Range (THRR);Understanding of Exercise Prescription  Increase Physical Activity;Increase Strength and Stamina;Able to understand and use Dyspnea scale;Able to understand and use rate of perceived exertion (RPE) scale;Knowledge and understanding of Target Heart Rate Range (THRR);Understanding of Exercise Prescription  Increase Strength and Stamina;Increase Physical Activity;Able to understand and use rate of perceived exertion (RPE) scale;Knowledge and understanding of Target Heart Rate Range (THRR);Understanding of Exercise Prescription;Able to understand and use Dyspnea scale   Comments  Patient has only attended two exercise sessions. Will cont. to monitor and progress as able.   Patient is progressing slowly in program. She is limited by orthopedic issues. Will cont. to progress as able.  Patient is doing well in program. She is showing new initiative to push herself. Home exercise completed. Hesitant to exercise at home. Will cont. to monitor and motivate patient.   Patient has been progressing well. Mets still range in "low" level. Works hard while she is here. Patient has missed a few sessions recently because her daughter is in the hospital.  Patient is motivated to work hard and make improvements. Daughter has just past away. Wants to cont. to come to rehab. We have extended her to 36 sessions. Will cont. to monitor and progress as able.    Expected Outcomes  Through exercising at rehab and at home, patient will increase physical strength and stamina and find ADL's easier to perform.   Through  exercising at rehab and at home, patient will increase physical strength and stamina and find ADL's easier to perform.   Through exercising at rehab and at home, patient will increase physical strength and stamina and find ADL's easier to perform.   Through exercising at rehab and at home, patient will increase physical strength and stamina and find ADL's easier to perform.   Through exercise at rehab and at home, patient will increase strength and stamina and will find that ADL's are easier to preform.    [place] Name 11/03/17 1522             Exercise Goal Re-Evaluation   Exercise Goals Review  Increase Physical Activity;Increase Strength and Stamina;Able to understand and use Dyspnea scale;Able to understand and use rate of perceived exertion (  RPE) scale;Knowledge and understanding of Target Heart Rate Range (THRR);Understanding of Exercise Prescription       Comments  Patient is motivated to work hard and make improvements. Daughter has just past away. Patient had a bad fall that broke her nose. Wants to cont. to come to rehab. We have extended her to 36 sessions. Will cont. to monitor and progress as able.        Expected Outcomes  Through exercise at rehab and at home, patient will increase strength and stamina and find that ADL's are easier to preform.           Discharge Exercise Prescription (Final Exercise Prescription Changes): Exercise Prescription Changes - 11/04/17 1500      Response to Exercise   Blood Pressure (Admit)  114/70    Blood Pressure (Exercise)  102/60    Blood Pressure (Exit)  138/80    Heart Rate (Admit)  84 bpm    Heart Rate (Exercise)  99 bpm    Heart Rate (Exit)  93 bpm    Oxygen Saturation (Admit)  96 %    Oxygen Saturation (Exercise)  94 %    Oxygen Saturation (Exit)  93 %    Rating of Perceived Exertion (Exercise)  13    Perceived Dyspnea (Exercise)  3    Duration  Progress to 45 minutes of aerobic exercise without signs/symptoms of physical distress     Intensity  THRR unchanged      Progression   Progression  Continue to progress workloads to maintain intensity without signs/symptoms of physical distress.      Resistance Training   Training Prescription  Yes    Weight  orange bands    Reps  10-15    Time  10 Minutes      Interval Training   Interval Training  No      NuStep   Level  6    Minutes  17    METs  2.3      Arm Ergometer   Level  5    Minutes  17      Track   Laps  7    Minutes  17       Nutrition:  Target Goals: Understanding of nutrition guidelines, daily intake of sodium <1521m, cholesterol <2066m calories 30% from fat and 7% or less from saturated fats, daily to have 5 or more servings of fruits and vegetables.  Biometrics:    Nutrition Therapy Plan and Nutrition Goals: Nutrition Therapy & Goals - 06/19/17 0842      Nutrition Therapy   Diet  General, Healthful      Personal Nutrition Goals   Nutrition Goal  Identify food quantities necessary to achieve wt loss of  -2# per week to a goal wt loss of 2.7-10.9 kg (6-24 lb) at graduation from pulmonary rehab.      Intervention Plan   Intervention  Prescribe, educate and counsel regarding individualized specific dietary modifications aiming towards targeted core components such as weight, hypertension, lipid management, diabetes, heart failure and other comorbidities.    Expected Outcomes  Short Term Goal: Understand basic principles of dietary content, such as calories, fat, sodium, cholesterol and nutrients.;Long Term Goal: Adherence to prescribed nutrition plan.       Nutrition Discharge: Rate Your Plate Scores: Nutrition Assessments - 06/19/17 0836      Rate Your Plate Scores   Pre Score  46       Nutrition Goals Re-Evaluation:   Nutrition  Goals Discharge (Final Nutrition Goals Re-Evaluation):   Psychosocial: Target Goals: Acknowledge presence or absence of significant depression and/or stress, maximize coping skills, provide positive  support system. Participant is able to verbalize types and ability to use techniques and skills needed for reducing stress and depression.  Initial Review & Psychosocial Screening: Initial Psych Review & Screening - 06/09/17 1328      Initial Review   Current issues with  None Identified      Family Dynamics   Good Support System?  Yes      Barriers   Psychosocial barriers to participate in program  There are no identifiable barriers or psychosocial needs.      Screening Interventions   Interventions  Encouraged to exercise       Quality of Life Scores:   PHQ-9: Recent Review Flowsheet Data    Depression screen [facility] 2/9 09/17/2017 06/09/2017 01/15/2017 07/15/2016   Decreased Interest 2 3 0 0   Down, Depressed, Hopeless 2 - 0  0   PHQ - 2 Score 4 3 0 0   Altered sleeping 0 0 - -   Tired, decreased energy 3 3 - -   Change in appetite 3 3 - -   Feeling bad or failure about yourself  2 3 - -   Trouble concentrating 3 0 - -   Moving slowly or fidgety/restless 0 0 - -   Suicidal thoughts 0 0 - -   PHQ-9 Score 15 12 - -   Difficult doing work/chores Very difficult Not difficult at all - -     Interpretation of Total Score  Total Score Depression Severity:  1-4 = Minimal depression, 5-9 = Mild depression, 10-14 = Moderate depression, 15-19 = Moderately severe depression, 20-27 = Severe depression   Psychosocial Evaluation and Intervention: Psychosocial Evaluation - 06/09/17 1330      Psychosocial Evaluation & Interventions   Interventions  Encouraged to exercise with the program and follow exercise prescription    Comments  dealing depression, but is working on making herself happy.    Continue Psychosocial Services   No Follow up required       Psychosocial Re-Evaluation: Psychosocial Re-Evaluation    [place] Name 07/01/17 1926 07/30/17 2105 08/22/17 1357 09/22/17 0836 10/14/17 1545     Psychosocial Re-Evaluation   Current issues with  None Identified  None Identified   Current Stress Concerns  Current Stress Concerns  Current Stress Concerns her daughter died after a long illness   Comments  -  -  patient worries about dauther who has terminal pulmonary disease and requires high flow oxygen. She worries that she is unable to help her daughter because of her own shortness of breath and deconditioning.   patient worries about dauther who has terminal pulmonary disease and requires high flow oxygen. She worries that she is unable to help her daughter because of her own shortness of breath and deconditioning.  her daughter has been hospitalized over the last week since she ran out of oxygen from the huricaine and the patient has not been at her rehab sessions.   She has not returned to the program since her daughter died, unable to evaluate psychosocial issue(s)   Expected Outcomes  -  patient will remain free from psychosocial barriers to participation  patient will remain free from psychosocial barriers to participation  patient will remain free from psychosocial barriers to participation  patient will remain free from psychosocial barriers to participation   Interventions  -  Encouraged to attend Pulmonary Rehabilitation for the exercise  Encouraged to attend Pulmonary Rehabilitation for the exercise  Encouraged to attend Pulmonary Rehabilitation for the exercise  Encouraged to attend Pulmonary Rehabilitation for the exercise   Continue Psychosocial Services   -  No Follow up required  No Follow up required  Follow up required by staff  Follow up required by staff   Comments  -  -  daughters terminal pulmonary illness  daughters terminal pulmonary illness  now she is dealing with her daughter's death     Initial Review   Source of Stress Concerns  -  -  Family  Family  Family   [place] Name 11/04/17 1558             Psychosocial Re-Evaluation   Current issues with  Current Stress Concerns       Comments  going through the grief process, has good and bad days, but  overall is doing well       Expected Outcomes  patient will remain free from psychosocial barriers to participation       Interventions  Encouraged to attend Pulmonary Rehabilitation for the exercise       Continue Psychosocial Services   Follow up required by staff         Initial Review   Source of Stress Concerns  Family          Psychosocial Discharge (Final Psychosocial Re-Evaluation): Psychosocial Re-Evaluation - 11/04/17 1558      Psychosocial Re-Evaluation   Current issues with  Current Stress Concerns    Comments  going through the grief process, has good and bad days, but overall is doing well    Expected Outcomes  patient will remain free from psychosocial barriers to participation    Interventions  Encouraged to attend Pulmonary Rehabilitation for the exercise    Continue Psychosocial Services   Follow up required by staff      Initial Review   Source of Stress Concerns  Family       Education: Education Goals: Education classes will be provided on a weekly basis, covering required topics. Participant will state understanding/return demonstration of topics presented.  Learning Barriers/Preferences: Learning Barriers/Preferences - 06/09/17 1314      Learning Barriers/Preferences   Learning Barriers  None    Learning Preferences  Pictoral       Education Topics: Risk Factor Reduction:  -Group instruction that is supported by a PowerPoint presentation. Instructor discusses the definition of a risk factor, different risk factors for pulmonary disease, and how the heart and lungs work together.     PULMONARY REHAB OTHER RESPIRATORY from 10/02/2017 in [place]  Date  06/26/17  Educator  ep  Instruction Review Code  2- meets goals/outcomes      Nutrition for Pulmonary Patient:  -Group instruction provided by PowerPoint slides, verbal discussion, and written materials to support subject matter. The instructor gives an explanation  and review of healthy diet recommendations, which includes a discussion on weight management, recommendations for fruit and vegetable consumption, as well as protein, fluid, caffeine, fiber, sodium, sugar, and alcohol. Tips for eating when patients are short of breath are discussed.   PULMONARY REHAB OTHER RESPIRATORY from 10/02/2017 in [place]  Date  07/24/17  Educator  edna  Instruction Review Code  2- meets goals/outcomes      Pursed Lip Breathing:  -Group instruction that is supported by demonstration and informational handouts.  Instructor discusses the benefits of pursed lip and diaphragmatic breathing and detailed demonstration on how to preform both.     Oxygen Safety:  -Group instruction provided by PowerPoint, verbal discussion, and written material to support subject matter. There is an overview of "What is Oxygen" and "Why do we need it".  Instructor also reviews how to create a safe environment for oxygen use, the importance of using oxygen as prescribed, and the risks of noncompliance. There is a brief discussion on traveling with oxygen and resources the patient may utilize.   PULMONARY REHAB OTHER RESPIRATORY from 10/02/2017 in [place]  Date  09/25/17  Educator  Truddie Crumble  Instruction Review Code  2- meets goals/outcomes      Oxygen Equipment:  -Group instruction provided by [employer] Staff utilizing handouts, written materials, and equipment demonstrations.   Signs and Symptoms:  -Group instruction provided by written material and verbal discussion to support subject matter. Warning signs and symptoms of infection, stroke, and heart attack are reviewed and when to call the physician/911 reinforced. Tips for preventing the spread of infection discussed.   PULMONARY REHAB OTHER RESPIRATORY from 10/02/2017 in [place]  Date  08/21/17  Educator  rn  Instruction Review Code  2-  meets goals/outcomes      Advanced Directives:  -Group instruction provided by verbal instruction and written material to support subject matter. Instructor reviews Advanced Directive laws and proper instruction for filling out document.   Pulmonary Video:  -Group video education that reviews the importance of medication and oxygen compliance, exercise, good nutrition, pulmonary hygiene, and pursed lip and diaphragmatic breathing for the pulmonary patient.   PULMONARY REHAB OTHER RESPIRATORY from 10/02/2017 in [place]  Date  08/28/17  Instruction Review Code  2- meets goals/outcomes      Exercise for the Pulmonary Patient:  -Group instruction that is supported by a PowerPoint presentation. Instructor discusses benefits of exercise, core components of exercise, frequency, duration, and intensity of an exercise routine, importance of utilizing pulse oximetry during exercise, safety while exercising, and options of places to exercise outside of rehab.     PULMONARY REHAB OTHER RESPIRATORY from 10/02/2017 in [place]  Date  08/12/17  Educator  ep  Instruction Review Code  2- meets goals/outcomes      Pulmonary Medications:  -Verbally interactive group education provided by instructor with focus on inhaled medications and proper administration.   PULMONARY REHAB OTHER RESPIRATORY from 10/02/2017 in [place]  Date  10/02/17  Educator  pharmacist  Instruction Review Code  R- Review/reinforce      Anatomy and Physiology of the Respiratory System and Intimacy:  -Group instruction provided by PowerPoint, verbal discussion, and written material to support subject matter. Instructor reviews respiratory cycle and anatomical components of the respiratory system and their functions. Instructor also reviews differences in obstructive and restrictive respiratory diseases with examples of each.  Intimacy, Sex, and Sexuality differences are reviewed with a discussion on how relationships can change when diagnosed with pulmonary disease. Common sexual concerns are reviewed.   MD DAY -A group question and answer session with a medical doctor that allows participants to ask questions that relate to their pulmonary disease state.   PULMONARY REHAB OTHER RESPIRATORY from 10/02/2017 in [place]  Date  09/09/17  Educator  yacoub  Instruction Review Code  2- meets goals/outcomes  OTHER EDUCATION -Group or individual verbal, written, or video instructions that support the educational goals of the pulmonary rehab program.   Knowledge Questionnaire Score:   Core Components/Risk Factors/Patient Goals at Admission: Personal Goals and Risk Factors at Admission - 06/09/17 1325      Core Components/Risk Factors/Patient Goals on Admission    Weight Management  Obesity;Yes    Intervention  Weight Management: Develop a combined nutrition and exercise program designed to reach desired caloric intake, while maintaining appropriate intake of nutrient and fiber, sodium and fats, and appropriate energy expenditure required for the weight goal.    Admit Weight  276 lb 0.3 oz (125.2 kg)    Goal Weight: Short Term  271 lb 2.7 oz (123 kg)    Goal Weight: Long Term  264 lb 8.8 oz (120 kg)    Expected Outcomes  Short Term: Continue to assess and modify interventions until short term weight is achieved    Improve shortness of breath with ADL's  Yes    Intervention  Provide education, individualized exercise plan and daily activity instruction to help decrease symptoms of SOB with activities of daily living.    Expected Outcomes  Short Term: Achieves a reduction of symptoms when performing activities of daily living.    Develop more efficient breathing techniques such as purse lipped breathing and diaphragmatic breathing; and practicing self-pacing with activity  Yes     Intervention  Provide education, demonstration and support about specific breathing techniuqes utilized for more efficient breathing. Include techniques such as pursed lipped breathing, diaphragmatic breathing and self-pacing activity.    Expected Outcomes  Short Term: Participant will be able to demonstrate and use breathing techniques as needed throughout daily activities.       Core Components/Risk Factors/Patient Goals Review:  Goals and Risk Factor Review    [place] Name 07/01/17 1924 07/30/17 2104 08/22/17 1357 09/22/17 0834 10/14/17 1543     Core Components/Risk Factors/Patient Goals Review   Personal Goals Review  -  Weight Management/Obesity;Improve shortness of breath with ADL's;Develop more efficient breathing techniques such as purse lipped breathing and diaphragmatic breathing and practicing self-pacing with activity.  Weight Management/Obesity;Improve shortness of breath with ADL's;Develop more efficient breathing techniques such as purse lipped breathing and diaphragmatic breathing and practicing self-pacing with activity.  -  Weight Management/Obesity;Improve shortness of breath with ADL's;Develop more efficient breathing techniques such as purse lipped breathing and diaphragmatic breathing and practicing self-pacing with activity.   Review  patient has only attended 3 exercise sessions since admission and is too soon to evaluate progression towards rehab goals  patient doing well in rehab. tolerating workload increases and utilizing PLB for her shortness of breath  patient doing well in rehab. tolerating workload increases and utilizing PLB for her shortness of breath  patient doing well in rehab. tolerating workload increases and utilizing PLB for her shortness of breath. she is s/p cardioversion two weeks ago but is unable to tell a difference in how she feels.   Has been doing well in pulmonary rehab physically, but her daughter died 2 weeks ago and she has been absent.  She is nearing  graduation.   Expected Outcomes  see admission expected outcomes  see admission expected outcomes  see admission expected outcomes  see admission expected outcomes  see admission expected outcomes   Row Name 11/04/17 1550             Core Components/Risk Factors/Patient Goals Review   Personal Goals Review  Weight Management/Obesity;Improve shortness  of breath with ADL's;Develop more efficient breathing techniques such as purse lipped breathing and diaphragmatic breathing and practicing self-pacing with activity.       Review  She has lost 7 kg, is going through the grief process and has her good and bad days and is in the process of possibly moving to an assisted living facility, she fell at home and broke her nose  and is recovering from this, otherwise is progressing well.       Expected Outcomes  to continue to progress as on admission outcomes          Core Components/Risk Factors/Patient Goals at Discharge (Final Review):  Goals and Risk Factor Review - 11/04/17 1550      Core Components/Risk Factors/Patient Goals Review   Personal Goals Review  Weight Management/Obesity;Improve shortness of breath with ADL's;Develop more efficient breathing techniques such as purse lipped breathing and diaphragmatic breathing and practicing self-pacing with activity.    Review  She has lost 7 kg, is going through the grief process and has her good and bad days and is in the process of possibly moving to an assisted living facility, she fell at home and broke her nose  and is recovering from this, otherwise is progressing well.    Expected Outcomes  to continue to progress as on admission outcomes       ITP Comments:   Comments: ITP REVIEW Pt is making expected progress toward pulmonary rehab goals after completing 25 sessions. Recommend continued exercise, life style modification, education, and utilization of breathing techniques to increase stamina and strength and decrease shortness of breath  with exertion.

## 2017-11-06 NOTE — Progress Notes (Signed)
Daily Session Note  Patient Details  Name: [name] [name] MRN: [id number] Date of Birth: Sep 22, 1941 Referring Provider:     Pulmonary Rehab Walk Test from 06/17/2017 in [place]  Referring Provider  Dr. Annamaria Boots      Encounter Date: 11/06/2017  Check In: Session Check In - 11/06/17 1330      Check-In   Location  [facility]    Staff Present  Su Hilt, MS, ACSM RCEP, Exercise Physiologist;Lisa Ysidro Evert, RN;Portia Trevose, RN, Maxcine Ham, RN, BSN    Supervising physician immediately available to respond to emergencies  [facility] immediately available    Physician(s)  Dr. Starla Link    Medication changes reported      No    Fall or balance concerns reported     No    Tobacco Cessation  No Change    Warm-up and Cool-down  Performed as group-led instruction    Resistance Training Performed  Yes    VAD Patient?  No      Pain Assessment   Currently in Pain?  No/denies    Multiple Pain Sites  No       Capillary Blood Glucose: No results found for this or any previous visit (from the past 24 hour(s)).  Exercise Prescription Changes - 11/06/17 1500      Response to Exercise   Blood Pressure (Admit)  112/74    Blood Pressure (Exercise)  100/70    Blood Pressure (Exit)  94/64    Heart Rate (Admit)  71 bpm    Heart Rate (Exercise)  106 bpm    Heart Rate (Exit)  69 bpm    Oxygen Saturation (Admit)  95 %    Oxygen Saturation (Exercise)  95 %    Oxygen Saturation (Exit)  97 %    Rating of Perceived Exertion (Exercise)  13    Perceived Dyspnea (Exercise)  1    Duration  Progress to 45 minutes of aerobic exercise without signs/symptoms of physical distress    Intensity  THRR unchanged      Progression   Progression  Continue to progress workloads to maintain intensity without signs/symptoms of physical distress.      Resistance Training   Training Prescription  Yes    Weight  orange bands    Reps  10-15    Time  10  Minutes      Interval Training   Interval Training  No      NuStep   Level  6    Minutes  17    METs  2.6      Track   Laps  13    Minutes  17       Social History   Tobacco Use  Smoking Status Former Smoker  . Packs/day: 2.00  . Years: 18.00  . Pack years: 36.00  . Types: Cigarettes  Smokeless Tobacco Never Used  Tobacco Comment   "quit smoking in 1971"    Goals Met:  Exercise tolerated well Strength training completed today  Goals Unmet:  Not Applicable  Comments: Service time is from 1330 to [place]    Dr. Rush Farmer is Medical Director for Pulmonary Rehab at [facility].

## 2017-11-11 ENCOUNTER — Encounter (HOSPITAL_COMMUNITY): Payer: Medicare Other

## 2017-11-13 ENCOUNTER — Encounter (HOSPITAL_COMMUNITY)
Admission: RE | Admit: 2017-11-13 | Discharge: 2017-11-13 | Disposition: A | Discharge status: Home / Self Care | Payer: Medicare Other | Source: Ambulatory Visit | Attending: Internal Medicine | Admitting: Internal Medicine

## 2017-11-13 VITALS — Wt 260.8 lb

## 2017-11-13 DIAGNOSIS — Z7901 Long term (current) use of anticoagulants: Secondary | ICD-10-CM | POA: Diagnosis not present

## 2017-11-13 DIAGNOSIS — Z79899 Other long term (current) drug therapy: Secondary | ICD-10-CM | POA: Diagnosis not present

## 2017-11-13 DIAGNOSIS — Z950 Presence of cardiac pacemaker: Secondary | ICD-10-CM | POA: Diagnosis not present

## 2017-11-13 DIAGNOSIS — Z7951 Long term (current) use of inhaled steroids: Secondary | ICD-10-CM | POA: Diagnosis not present

## 2017-11-13 DIAGNOSIS — R0609 Other forms of dyspnea: Secondary | ICD-10-CM | POA: Diagnosis not present

## 2017-11-13 DIAGNOSIS — M1990 Unspecified osteoarthritis, unspecified site: Secondary | ICD-10-CM | POA: Diagnosis not present

## 2017-11-13 NOTE — Progress Notes (Signed)
Daily Session Note  Patient Details  Name: [name] [name] MRN: [id number] Date of Birth: 25-Dec-1940 Referring Provider:     Pulmonary Rehab Walk Test from 06/17/2017 in [place]  Referring Provider  Dr. Annamaria Boots      Encounter Date: 11/13/2017  Check In: Session Check In - 11/13/17 1330      Check-In   Location  [facility]    Staff Present  Su Hilt, MS, ACSM RCEP, Exercise Physiologist;Salma Walrond Rollene Rotunda, RN, Maxcine Ham, RN, BSN    Supervising physician immediately available to respond to emergencies  [facility] immediately available    Physician(s)  Dr. Starla Link    Medication changes reported      No    Fall or balance concerns reported     No    Tobacco Cessation  No Change    Warm-up and Cool-down  Performed as group-led instruction    Resistance Training Performed  Yes    VAD Patient?  No      Pain Assessment   Currently in Pain?  No/denies    Multiple Pain Sites  No       Capillary Blood Glucose: No results found for this or any previous visit (from the past 24 hour(s)).  Exercise Prescription Changes - 11/13/17 1533      Response to Exercise   Blood Pressure (Admit)  112/54    Blood Pressure (Exercise)  110/66    Blood Pressure (Exit)  100/60    Heart Rate (Admit)  70 bpm    Heart Rate (Exercise)  112 bpm    Heart Rate (Exit)  75 bpm    Oxygen Saturation (Admit)  95 %    Oxygen Saturation (Exercise)  95 %    Oxygen Saturation (Exit)  93 %    Rating of Perceived Exertion (Exercise)  13    Perceived Dyspnea (Exercise)  3    Duration  Progress to 45 minutes of aerobic exercise without signs/symptoms of physical distress    Intensity  THRR unchanged      Progression   Progression  Continue to progress workloads to maintain intensity without signs/symptoms of physical distress.      Resistance Training   Training Prescription  Yes    Weight  orange bands    Reps  10-15    Time  10 Minutes      Interval Training   Interval Training  No      NuStep   Level  6    Minutes  17    METs  2.3      Arm Ergometer   Level  6    Minutes  17      Track   Laps  14    Minutes  17       Social History   Tobacco Use  Smoking Status Former Smoker  . Packs/day: 2.00  . Years: 18.00  . Pack years: 36.00  . Types: Cigarettes  Smokeless Tobacco Never Used  Tobacco Comment   "quit smoking in 1971"    Goals Met:  Independence with exercise equipment Improved SOB with ADL's Using PLB without cueing & demonstrates good technique Exercise tolerated well No report of cardiac concerns or symptoms Strength training completed today  Goals Unmet:  Not Applicable  Comments: Service time is from 1330 to 1500   Dr. Rush Farmer is Medical Director for Pulmonary Rehab at [facility].

## 2017-11-14 ENCOUNTER — Telehealth: Payer: Self-pay | Admitting: Cardiovascular Disease

## 2017-11-14 NOTE — Telephone Encounter (Signed)
Pt aware medications sample left at front desk

## 2017-11-14 NOTE — Telephone Encounter (Signed)
Patient calling the office for samples of medication:   1.  What medication and dosage are you requesting samples for? eliquis 13m  2.  Are you currently out of this medication? 3 days

## 2017-11-17 ENCOUNTER — Telehealth: Payer: Self-pay | Admitting: Internal Medicine

## 2017-11-17 NOTE — Telephone Encounter (Signed)
Called and spoke with pt. Pt reports of nasal drainage clear in color, mild right lower back discomfort & prod cough with clear mucus x2-3d. Pt states sob is baseline.   Denies fever, chills or sweats.  Pt has not taken any OTC medications.   CY please advise. Thanks.   Current Outpatient Medications on File Prior to Visit  Medication Sig Dispense Refill  . apixaban (ELIQUIS) 5 MG TABS tablet Take 1 tablet (5 mg total) by mouth 2 (two) times daily. 28 tablet 0  . Biotin 5000 MCG CAPS Take 5,000 mcg by mouth daily.     . Calcium Carbonate-Vit D-Min (CALCIUM 1200 PO) Take 2,400 mg by mouth daily.     . cholecalciferol (VITAMIN D) 1000 UNITS tablet Take 1,000 Units by mouth daily.     Marland Kitchen Co-Enzyme Q10 100 MG CAPS Take 100 mg by mouth daily.     . diazepam (VALIUM) 5 MG tablet TAKE 1 TABLET BY MOUTH ONCE DAILY AS NEEDED FOR ANXIETY 30 tablet 1  . folic acid (FOLVITE) 500 MCG tablet Take 400 mcg by mouth daily.    Marland Kitchen glucosamine-chondroitin 500-400 MG tablet Take 1 tablet by mouth daily.     Marland Kitchen loratadine (KLS ALLERCLEAR) 10 MG tablet Take 10 mg by mouth daily.    Marland Kitchen MAGNESIUM CITRATE PO Take 250 mg by mouth daily. 1 tab daily    . metoprolol tartrate (LOPRESSOR) 25 MG tablet Take 0.5 tablets (12.5 mg total) by mouth 2 (two) times daily. 90 tablet 3  . nitroGLYCERIN (NITROSTAT) 0.4 MG SL tablet Place 0.4 mg under the tongue every 5 (five) minutes as needed for chest pain.    . pantoprazole (PROTONIX) 40 MG tablet TAKE 1 TABLET BY MOUTH DAILY 90 tablet 1  . Resveratrol 250 MG CAPS Take 250 mg by mouth daily.    . sertraline (ZOLOFT) 100 MG tablet Take 1 tablet (100 mg total) by mouth daily. 90 tablet 0  . Simethicone (GAS-X PO) Take 2 tablets by mouth daily as needed (bloating).     No current facility-administered medications on file prior to visit.     Allergies  Allergen Reactions  . Ancef [Cefazolin] Hives and Itching  . Pseudoephedrine Hcl     Other reaction(s): palpitations (moderate)  .  Caffeine Palpitations

## 2017-11-17 NOTE — Telephone Encounter (Signed)
Spoke with pt,aware of recs.  Nothing further needed.  

## 2017-11-17 NOTE — Telephone Encounter (Signed)
This is likely viral. Don't expect antibiotic to help unless it gets worse. Suggest otc cold and flu products like Advil Cold or etc, for symptom help. Throat lozenges, Mucinex-DM might help.

## 2017-11-19 ENCOUNTER — Ambulatory Visit (INDEPENDENT_AMBULATORY_CARE_PROVIDER_SITE_OTHER): Payer: Medicare Other | Admitting: Family Medicine

## 2017-11-19 VITALS — BP 118/75 | HR 76 | Temp 97.9°F | Ht 67.0 in | Wt 254.0 lb

## 2017-11-19 DIAGNOSIS — Z6839 Body mass index (BMI) 39.0-39.9, adult: Secondary | ICD-10-CM | POA: Diagnosis not present

## 2017-11-19 DIAGNOSIS — F4321 Adjustment disorder with depressed mood: Secondary | ICD-10-CM

## 2017-11-19 NOTE — Progress Notes (Signed)
Office: [phone]  /  Fax: [phone]   HPI:   Chief Complaint: OBESITY [name] [name] is here to discuss her progress with her obesity treatment plan. She is on the  follow the Category 2 plan and is following her eating plan approximately 60 % of the time. She states she is exercising 0 minutes 0 times per week. [name] [name] continues to do well with weight loss even with increase temptations. She is sometimes skipping meals and has increased eating out.  Her weight is 254 lb (115.2 kg) today and has had a weight loss of 2 pounds over a period of 2 weeks since her last visit. She has lost 13 lbs since starting treatment with [place].  Grieving Reaction [name] [name]'s daughter passed away recently and she is still struggling with grieving, but it appears to be a healthy reaction. She is briefly tearful in the office but recovered well.  ALLERGIES: Allergies  Allergen Reactions  . Ancef [Cefazolin] Hives and Itching  . Pseudoephedrine Hcl     Other reaction(s): palpitations (moderate)  . Caffeine Palpitations    MEDICATIONS: Current Outpatient Medications on File Prior to Visit  Medication Sig Dispense Refill  . apixaban (ELIQUIS) 5 MG TABS tablet Take 1 tablet (5 mg total) by mouth 2 (two) times daily. 28 tablet 0  . Biotin 5000 MCG CAPS Take 5,000 mcg by mouth daily.     . Calcium Carbonate-Vit D-Min (CALCIUM 1200 PO) Take 2,400 mg by mouth daily.     . cholecalciferol (VITAMIN D) 1000 UNITS tablet Take 1,000 Units by mouth daily.     Marland Kitchen Co-Enzyme Q10 100 MG CAPS Take 100 mg by mouth daily.     . diazepam (VALIUM) 5 MG tablet TAKE 1 TABLET BY MOUTH ONCE DAILY AS NEEDED FOR ANXIETY 30 tablet 1  . folic acid (FOLVITE) 203 MCG tablet Take 400 mcg by mouth daily.    Marland Kitchen glucosamine-chondroitin 500-400 MG tablet Take 1 tablet by mouth daily.     Marland Kitchen loratadine (KLS ALLERCLEAR) 10 MG tablet Take 10 mg by mouth daily.    Marland Kitchen MAGNESIUM CITRATE PO Take 250 mg by mouth daily. 1 tab daily    . metoprolol tartrate  (LOPRESSOR) 25 MG tablet Take 0.5 tablets (12.5 mg total) by mouth 2 (two) times daily. 90 tablet 3  . nitroGLYCERIN (NITROSTAT) 0.4 MG SL tablet Place 0.4 mg under the tongue every 5 (five) minutes as needed for chest pain.    . pantoprazole (PROTONIX) 40 MG tablet TAKE 1 TABLET BY MOUTH DAILY 90 tablet 1  . Resveratrol 250 MG CAPS Take 250 mg by mouth daily.    . sertraline (ZOLOFT) 100 MG tablet Take 1 tablet (100 mg total) by mouth daily. 90 tablet 0  . Simethicone (GAS-X PO) Take 2 tablets by mouth daily as needed (bloating).     No current facility-administered medications on file prior to visit.     PAST MEDICAL HISTORY: Past Medical History:  Diagnosis Date  . Alpha-1-antitrypsin deficiency carrier ([place])   . Arthritis    "all over"  . Atrial fibrillation ([place])   . Back pain   . Complication of anesthesia    "I woke up during 2 different procedures" (10/04/2014)  . Frequent UTI   . GERD (gastroesophageal reflux disease)   . Hypertension   . MVP (mitral valve prolapse)   . Obesity   . Osteoarthritis   . Presence of permanent cardiac pacemaker   . PVC's (premature ventricular contractions)   .  Shortness of breath   . 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 Sleep apnea   . Stroke [facility]) 01/2014   denies deficits on 10/04/2014  . Tachy-brady syndrome ([place]) 10/2014  . TIA (transient ischemic attack)     PAST SURGICAL HISTORY: Past Surgical History:  Procedure Laterality Date  . CARDIOVERSION N/A 09/02/2017   Procedure: CARDIOVERSION;  Surgeon: Sanda Klein, MD;  Location: [facility] ENDOSCOPY;  Service: Cardiovascular;  Laterality: N/A;  . EXCISION VAGINAL CYST     benign nodules  . INSERT / REPLACE / REMOVE PACEMAKER  10/04/2014   Medtronic Advisa model [id number] serial number [id number]  . LOOP RECORDER EXPLANT N/A 10/04/2014   Procedure: LOOP RECORDER EXPLANT;  Surgeon: Sanda Klein, MD;  Location: [place] CATH LAB;  Service:  Cardiovascular;  Laterality: N/A;  . LOOP RECORDER IMPLANT N/A 04/19/2014   Procedure: LOOP RECORDER IMPLANT;  Surgeon: Sanda Klein, MD;  Location: [place] CATH LAB;  Service: Cardiovascular;  Laterality: N/A;  . NM MYOCAR PERF WALL MOTION  12/18/2007   normal  . PERMANENT PACEMAKER INSERTION N/A 10/04/2014   Procedure: PERMANENT PACEMAKER INSERTION;  Surgeon: Sanda Klein, MD;  Location: [place] CATH LAB;  Service: Cardiovascular;  Laterality: N/A;  . TUBAL LIGATION    . US ECHOCARDIOGRAPHY  05/15/2010   LA mildly dilated,mild mitral annular ca+, AOV mildly sclerotic, mild asymmetric LVH    SOCIAL HISTORY: Social History   Tobacco Use  . Smoking status: Former Smoker    Packs/day: 2.00    Years: 18.00    Pack years: 36.00    Types: Cigarettes  . Smokeless tobacco: Never Used  . Tobacco comment: "quit smoking in 1971"  Substance Use Topics  . Alcohol use: Yes    Alcohol/week: 0.0 oz    Comment: 10/04/2014 "might have a drink a couple times/yr"  . Drug use: No    FAMILY HISTORY: Family History  Problem Relation Age of Onset  . Heart attack Mother   . Sudden death Mother   . Depression Mother   . Stroke Father   . Heart failure Father   . Depression Father   . Heart failure Brother   . Stroke Brother     ROS: Review of Systems  Constitutional: Positive for weight loss.    PHYSICAL EXAM: Blood pressure 118/75, pulse 76, temperature 97.9 F (36.6 C), temperature source Oral, height _0  (1.702 m), weight 254 lb (115.2 kg), SpO2 99 %. Body mass index is 39.78 kg/m. Physical Exam  Constitutional: She is oriented to person, place, and time. She appears well-developed and well-nourished.  Cardiovascular: Normal rate.  Pulmonary/Chest: Effort normal.  Musculoskeletal: Normal range of motion.  Neurological: She is oriented to person, place, and time.  Skin: Skin is warm and dry.  Psychiatric: She has a normal mood and affect. Her behavior is normal.  Vitals  reviewed.   RECENT LABS AND TESTS: BMET    Component Value Date/Time   NA 139 09/17/2017 1131   K 4.2 09/17/2017 1131   CL 102 09/17/2017 1131   CO2 21 09/17/2017 1131   GLUCOSE 97 09/17/2017 1131   GLUCOSE 92 01/15/2017 1417   BUN 14 09/17/2017 1131   CREATININE 1.07 (H) 09/17/2017 1131   CREATININE 0.86 09/28/2014 1557   CALCIUM 8.9 09/17/2017 1131   GFRNONAA 51 (L) 09/17/2017 1131   GFRNONAA 56 (L) 06/06/2014 1653   GFRAA 58 (L) 09/17/2017 1131   GFRAA 65 06/06/2014 1653  Lab Results  Component Value Date   HGBA1C 6.2 (H) 09/17/2017   HGBA1C 5.9 (H) 03/09/2014   Lab Results  Component Value Date   INSULIN 47.7 (H) 09/17/2017   CBC    Component Value Date/Time   WBC 8.5 09/17/2017 1131   WBC 9.1 02/24/2017 1555   RBC 5.22 09/17/2017 1131   RBC 5.09 02/24/2017 1555   HGB 14.9 09/17/2017 1131   HCT 45.1 09/17/2017 1131   PLT 258 08/27/2017 1509   MCV 86 09/17/2017 1131   MCH 28.5 09/17/2017 1131   MCH 29.0 09/28/2014 1557   MCHC 33.0 09/17/2017 1131   MCHC 33.9 02/24/2017 1555   RDW 13.5 09/17/2017 1131   LYMPHSABS 2.0 09/17/2017 1131   MONOABS 1.3 (H) 02/24/2017 1555   EOSABS 0.2 09/17/2017 1131   BASOSABS 0.0 09/17/2017 1131   Iron/TIBC/Ferritin/ %Sat No results found for: IRON, TIBC, FERRITIN, IRONPCTSAT Lipid Panel     Component Value Date/Time   CHOL 164 09/17/2017 1131   TRIG 117 09/17/2017 1131   HDL 47 09/17/2017 1131   CHOLHDL 3 01/15/2017 1417   VLDL 33.6 01/15/2017 1417   LDLCALC 94 09/17/2017 1131   LDLDIRECT 81.0 07/15/2016 1419   Hepatic Function Panel     Component Value Date/Time   PROT 6.8 09/17/2017 1131   ALBUMIN 3.8 09/17/2017 1131   AST 30 09/17/2017 1131   ALT 16 09/17/2017 1131   ALKPHOS 81 09/17/2017 1131   BILITOT 0.4 09/17/2017 1131   BILIDIR 0.1 01/15/2017 1417      Component Value Date/Time   TSH 2.040 09/17/2017 1131   TSH 2.70 07/15/2016 1419   TSH 1.529 06/06/2014 1608    ASSESSMENT AND PLAN: Grieving  - reaction  Class 2 severe obesity with serious comorbidity and body mass index (BMI) of 39.0 to 39.9 in adult, unspecified obesity type ([place])  PLAN:  Grieving Reaction We discussed how [name] [name] is dealing with stress and grief and she was offered support and reassurance. We will monitor her mood closely to watch for signs of depression. [name] [name] agrees to follow up with our clinic in 3 weeks.  Obesity [name] [name] is currently in the action stage of change. As such, her goal is to continue with weight loss efforts She has agreed to follow the Category 2 plan [name] [name] has been instructed to work up to a goal of 150 minutes of combined cardio and strengthening exercise per week for weight loss and overall health benefits. We discussed the following Behavioral Modification Strategies today: dealing with family or coworker sabotage, no skipping meals, and travel eating strategies   [name] [name] has agreed to follow up with our clinic in 3 weeks. She was informed of the importance of frequent follow up visits to maximize her success with intensive lifestyle modifications for her multiple health conditions.  I, Trixie Dredge, am acting as transcriptionist for Dennard Nip, MD  I have reviewed the above documentation for accuracy and completeness, and I agree with the above. -Dennard Nip, MD     Today's visit was # 5 out of 22.  Starting weight: 267 lbs Starting date: 09/17/17 Today's weight : 254 lbs  Today's date: 11/19/2017 Total lbs lost to date: 13 (Patients must lose 7 lbs in the first 6 months to continue with counseling)   ASK: We discussed the diagnosis of obesity with [name] [name] today and [name] [name] agreed to give [place] permission to discuss obesity behavioral modification therapy today.  ASSESS: [name] [name] has the diagnosis of obesity  and her BMI today is 39.77 [name] [name] is in the action stage of change   ADVISE: [name] [name] was educated on the multiple health risks of obesity as well as the  benefit of weight loss to improve her health. She was advised of the need for long term treatment and the importance of lifestyle modifications.  AGREE: Multiple dietary modification options and treatment options were discussed and  [name] [name] agreed to follow the Category 2 plan We discussed the following Behavioral Modification Strategies today: dealing with family or coworker sabotage, no skipping meals, and travel eating strategies

## 2017-11-20 ENCOUNTER — Ambulatory Visit (HOSPITAL_COMMUNITY): Payer: Medicare Other

## 2017-11-20 ENCOUNTER — Encounter (HOSPITAL_COMMUNITY)
Admission: RE | Admit: 2017-11-20 | Discharge: 2017-11-20 | Disposition: A | Discharge status: Home / Self Care | Payer: Medicare Other | Source: Ambulatory Visit

## 2017-11-27 ENCOUNTER — Encounter (HOSPITAL_COMMUNITY)
Admission: RE | Admit: 2017-11-27 | Discharge: 2017-11-27 | Disposition: A | Discharge status: Home / Self Care | Payer: Medicare Other | Source: Ambulatory Visit | Attending: Internal Medicine | Admitting: Internal Medicine

## 2017-11-27 ENCOUNTER — Ambulatory Visit (HOSPITAL_COMMUNITY): Payer: Medicare Other

## 2017-11-27 VITALS — Wt 259.5 lb

## 2017-11-27 DIAGNOSIS — Z79899 Other long term (current) drug therapy: Secondary | ICD-10-CM | POA: Diagnosis not present

## 2017-11-27 DIAGNOSIS — R0609 Other forms of dyspnea: Secondary | ICD-10-CM | POA: Diagnosis not present

## 2017-11-27 DIAGNOSIS — Z7901 Long term (current) use of anticoagulants: Secondary | ICD-10-CM | POA: Diagnosis not present

## 2017-11-27 DIAGNOSIS — Z7951 Long term (current) use of inhaled steroids: Secondary | ICD-10-CM | POA: Diagnosis not present

## 2017-11-27 DIAGNOSIS — M1990 Unspecified osteoarthritis, unspecified site: Secondary | ICD-10-CM | POA: Diagnosis not present

## 2017-11-27 DIAGNOSIS — Z950 Presence of cardiac pacemaker: Secondary | ICD-10-CM | POA: Diagnosis not present

## 2017-11-27 NOTE — Progress Notes (Signed)
Pulmonary Individual Treatment Plan  Patient Details  Name: [name] [name] MRN: [id number] Date of Birth: 02/03/1941 Referring Provider:     Pulmonary Rehab Walk Test from 06/17/2017 in [place]  Referring Provider  Dr. Annamaria Boots      Initial Encounter Date:    Pulmonary Rehab Walk Test from 06/17/2017 in [place]  Date  06/19/17  Referring Provider  Dr. Annamaria Boots      Visit Diagnosis: Dyspnea on exertion  Patient's Home Medications on Admission:   Current Outpatient Medications:  .  apixaban (ELIQUIS) 5 MG TABS tablet, Take 1 tablet (5 mg total) by mouth 2 (two) times daily., Disp: 28 tablet, Rfl: 0 .  Biotin 5000 MCG CAPS, Take 5,000 mcg by mouth daily. , Disp: , Rfl:  .  Calcium Carbonate-Vit D-Min (CALCIUM 1200 PO), Take 2,400 mg by mouth daily. , Disp: , Rfl:  .  cholecalciferol (VITAMIN D) 1000 UNITS tablet, Take 1,000 Units by mouth daily. , Disp: , Rfl:  .  Co-Enzyme Q10 100 MG CAPS, Take 100 mg by mouth daily. , Disp: , Rfl:  .  diazepam (VALIUM) 5 MG tablet, TAKE 1 TABLET BY MOUTH ONCE DAILY AS NEEDED FOR ANXIETY, Disp: 30 tablet, Rfl: 1 .  folic acid (FOLVITE) 403 MCG tablet, Take 400 mcg by mouth daily., Disp: , Rfl:  .  glucosamine-chondroitin 500-400 MG tablet, Take 1 tablet by mouth daily. , Disp: , Rfl:  .  loratadine (KLS ALLERCLEAR) 10 MG tablet, Take 10 mg by mouth daily., Disp: , Rfl:  .  MAGNESIUM CITRATE PO, Take 250 mg by mouth daily. 1 tab daily, Disp: , Rfl:  .  metoprolol tartrate (LOPRESSOR) 25 MG tablet, Take 0.5 tablets (12.5 mg total) by mouth 2 (two) times daily., Disp: 90 tablet, Rfl: 3 .  nitroGLYCERIN (NITROSTAT) 0.4 MG SL tablet, Place 0.4 mg under the tongue every 5 (five) minutes as needed for chest pain., Disp: , Rfl:  .  pantoprazole (PROTONIX) 40 MG tablet, TAKE 1 TABLET BY MOUTH DAILY, Disp: 90 tablet, Rfl: 1 .  Resveratrol 250 MG CAPS, Take 250 mg by mouth daily., Disp: , Rfl:  .   sertraline (ZOLOFT) 100 MG tablet, Take 1 tablet (100 mg total) by mouth daily., Disp: 90 tablet, Rfl: 0 .  Simethicone (GAS-X PO), Take 2 tablets by mouth daily as needed (bloating)., Disp: , Rfl:   Past Medical History: Past Medical History:  Diagnosis Date  . Alpha-1-antitrypsin deficiency carrier ([place])   . Arthritis    "all over"  . Atrial fibrillation ([place])   . Back pain   . Complication of anesthesia    "I woke up during 2 different procedures" (10/04/2014)  . Frequent UTI   . GERD (gastroesophageal reflux disease)   . Hypertension   . MVP (mitral valve prolapse)   . Obesity   . Osteoarthritis   . Presence of permanent cardiac pacemaker   . PVC's (premature ventricular contractions)   . Shortness of breath   . 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 Sleep apnea   . Stroke [facility]) 01/2014   denies deficits on 10/04/2014  . Tachy-brady syndrome ([place]) 10/2014  . TIA (transient ischemic attack)     Tobacco Use: Social History   Tobacco Use  Smoking Status Former Smoker  . Packs/day: 2.00  . Years: 18.00  . Pack years: 36.00  . Types: Cigarettes  Smokeless  Tobacco Never Used  Tobacco Comment   "quit smoking in 1971"    Labs: Recent Review Flowsheet Data    Labs for ITP Cardiac and Pulmonary Rehab Latest Ref Rng & Units 03/09/2014 10/25/2014 07/15/2016 01/15/2017 09/17/2017   Cholestrol 100 - 199 mg/dL 152 184 163 182 164   LDLCALC 0 - 99 mg/dL 77 138 - 94 94   LDLDIRECT mg/dL - - 81.0 - -   HDL >39 mg/dL 58 46 45.70 54.60 47   Trlycerides 0 - 149 mg/dL 87 210(A) 208.0(H) 168.0(H) 117   Hemoglobin A1c 4.8 - 5.6 % 5.9(H) - - - 6.2(H)   TCO2 0 - 100 mmol/L - - - - -      Capillary Blood Glucose: Lab Results  Component Value Date   GLUCAP 103 (H) 03/09/2014   GLUCAP 103 (H) 03/09/2014   GLUCAP 112 (H) 03/09/2014   POCT Glucose    Row Name 07/15/17 1555             POCT Blood Glucose   Pre-Exercise  220 mg/dL        Post-Exercise  165 mg/dL          Pulmonary Assessment Scores: Pulmonary Assessment Scores    Row Name 06/19/17 0728         ADL UCSD   ADL Phase  Entry       mMRC Score   mMRC Score  4        Pulmonary Function Assessment: Pulmonary Function Assessment - 06/09/17 1315      Breath   Bilateral Breath Sounds  Clear    Shortness of Breath  Yes;Limiting activity;Fear of Shortness of Breath       Exercise Target Goals:    Exercise Program Goal: Individual exercise prescription set with THRR, safety & activity barriers. Participant demonstrates ability to understand and report RPE using BORG scale, to self-measure pulse accurately, and to acknowledge the importance of the exercise prescription.  Exercise Prescription Goal: Starting with aerobic activity 30 plus minutes a day, 3 days per week for initial exercise prescription. Provide home exercise prescription and guidelines that participant acknowledges understanding prior to discharge.  Activity Barriers & Risk Stratification:   6 Minute Walk: 6 Minute Walk    Row Name 06/19/17 0723         6 Minute Walk   Phase  Initial     Distance  800 feet     Walk Time  - 4 minutes 50 seconds total     # of Rest Breaks  2 1st: 40 seconds 2nd: 30 seconds     MPH  1.5     METS  2.15     RPE  13     Perceived Dyspnea   3     Symptoms  Yes (comment)     Comments  used wheelchair/5/10 hip pain/limited by shortness of breath     Resting HR  90 bpm     Resting BP  105/66     Max Ex. HR  127 bpm     Max Ex. BP  105/65       Interval HR   Baseline HR (retired)  90     1 Minute HR  144     2 Minute HR  127     3 Minute HR  102     4 Minute HR  98     5 Minute HR  98     6 Minute  HR  102     2 Minute Post HR  95     Interval Heart Rate?  Yes       Interval Oxygen   Interval Oxygen?  Yes     Baseline Oxygen Saturation %  94 %     Resting Liters of Oxygen  0 L     1 Minute Oxygen Saturation %  93 %     1 Minute  Liters of Oxygen  0 L     2 Minute Oxygen Saturation %  93 %     2 Minute Liters of Oxygen  0 L     3 Minute Oxygen Saturation %  93 %     3 Minute Liters of Oxygen  0 L     4 Minute Oxygen Saturation %  94 %     4 Minute Liters of Oxygen  0 L     5 Minute Oxygen Saturation %  92 %     5 Minute Liters of Oxygen  0 L     6 Minute Oxygen Saturation %  92 %     6 Minute Liters of Oxygen  0 L     2 Minute Post Oxygen Saturation %  93 %     2 Minute Post Liters of Oxygen  0 L        Oxygen Initial Assessment: Oxygen Initial Assessment - 06/19/17 0726      Initial 6 min Walk   Oxygen Used  None    Resting Oxygen Saturation   94 %    Exercise Oxygen Saturation  during 6 min walk  92 %      Program Oxygen Prescription   Program Oxygen Prescription  None       Oxygen Re-Evaluation: Oxygen Re-Evaluation    Row Name 07/01/17 1924 07/30/17 2103 08/22/17 1357 09/22/17 0834 10/14/17 1543     Program Oxygen Prescription   Program Oxygen Prescription  None  None  None  None  None     Home Oxygen   Home Oxygen Device  None  None  None  None  None   Sleep Oxygen Prescription  CPAP  CPAP  CPAP  CPAP  CPAP   Liters per minute  0  -  -  0  -   Home Exercise Oxygen Prescription  None  None  None  None  None   Home at Rest Exercise Oxygen Prescription  None  None  None  None  None   Compliance with Home Oxygen Use  Yes  -  -  Yes  Yes   Row Name 11/04/17 1549 11/21/17 0845           Program Oxygen Prescription   Program Oxygen Prescription  None  None        Home Oxygen   Home Oxygen Device  None  None      Sleep Oxygen Prescription  CPAP  CPAP      Liters per minute  -  0      Home Exercise Oxygen Prescription  None  None      Home at Rest Exercise Oxygen Prescription  None  None      Compliance with Home Oxygen Use  Yes  Yes         Oxygen Discharge (Final Oxygen Re-Evaluation): Oxygen Re-Evaluation - 11/21/17 0845      Program Oxygen Prescription   Program Oxygen  Prescription  None  Home Oxygen   Home Oxygen Device  None    Sleep Oxygen Prescription  CPAP    Liters per minute  0    Home Exercise Oxygen Prescription  None    Home at Rest Exercise Oxygen Prescription  None    Compliance with Home Oxygen Use  Yes       Initial Exercise Prescription: Initial Exercise Prescription - 06/19/17 0700      Date of Initial Exercise RX and Referring Provider   Date  06/19/17    Referring Provider  Dr. Annamaria Boots      Recumbant Bike   Level  1    Minutes  17      NuStep   Level  2    Minutes  17    METs  1.5      Track   Laps  4    Minutes  17      Prescription Details   Frequency (times per week)  2    Duration  Progress to 45 minutes of aerobic exercise without signs/symptoms of physical distress      Intensity   THRR 40-80% of Max Heartrate  58-115    Ratings of Perceived Exertion  11-13    Perceived Dyspnea  0-4      Progression   Progression  Continue progressive overload as per policy without signs/symptoms or physical distress.      Resistance Training   Training Prescription  Yes    Weight  orange bands    Reps  10-15       Perform Capillary Blood Glucose checks as needed.  Exercise Prescription Changes: Exercise Prescription Changes    Row Name 06/24/17 1500 06/26/17 1500 07/01/17 1500 07/03/17 1500 07/08/17 1500     Response to Exercise   Blood Pressure (Admit)  104/60  120/60  100/56  96/60  110/64   Blood Pressure (Exercise)  124/70  130/84  122/82  130/86  118/70   Blood Pressure (Exit)  100/60  108/74  104/60  126/60  108/78   Heart Rate (Admit)  79 bpm  78 bpm  97 bpm  79 bpm  72 bpm   Heart Rate (Exercise)  105 bpm  94 bpm  107 bpm  105 bpm  94 bpm   Heart Rate (Exit)  83 bpm  70 bpm  79 bpm  71 bpm  76 bpm   Oxygen Saturation (Admit)  94 %  94 %  96 %  96 %  94 %   Oxygen Saturation (Exercise)  94 %  93 %  93 %  87 % Sat increased to 92% with rest  93 % Sat increased to 92% with rest   Oxygen Saturation  (Exit)  93 %  95 %  94 %  93 %  96 %   Rating of Perceived Exertion (Exercise)  _0 Perceived Dyspnea (Exercise)  _1 Duration  Progress to 45 minutes of aerobic exercise without signs/symptoms of physical distress  Progress to 45 minutes of aerobic exercise without signs/symptoms of physical distress  Progress to 45 minutes of aerobic exercise without signs/symptoms of physical distress  Progress to 45 minutes of aerobic exercise without signs/symptoms of physical distress  Progress to 45 minutes of aerobic exercise without signs/symptoms of physical distress   Intensity  Other (comment) 40-8-% of HRR  THRR unchanged  THRR unchanged  THRR unchanged  THRR unchanged     Progression   Progression  Continue to progress workloads to maintain intensity without signs/symptoms of physical distress.  Continue to progress workloads to maintain intensity without signs/symptoms of physical distress.  Continue to progress workloads to maintain intensity without signs/symptoms of physical distress.  Continue to progress workloads to maintain intensity without signs/symptoms of physical distress.  Continue to progress workloads to maintain intensity without signs/symptoms of physical distress.     Resistance Training   Training Prescription  Yes  Yes  Yes  Yes  Yes   Weight  orange bands  orange bands  orange bands  orange bands  orange bands   Reps  10-15  10-15  10-15  10-15  10-15   Time  10 Minutes  10 Minutes  10 Minutes  10 Minutes  10 Minutes     NuStep   Level  _0 -  2   Minutes  _1 -  17   METs  1.9  1.1  1.9  -  1.8     Arm Ergometer   Level  _2 Minutes  _3 Track   Laps  5  -  _4 Minutes  17  -  _5 Row Name 07/10/17 1500 07/15/17 1500 07/17/17 1500 07/24/17 1605 07/31/17 1600     Response to Exercise   Blood Pressure (Admit)  96/64  94/60  90/50  104/70  110/62   Blood Pressure (Exercise)   112/86  110/74  98/50  108/64  114/60   Blood Pressure (Exit)  100/60  100/64  100/60  104/60  98/0   Heart Rate (Admit)  75 bpm  70 bpm  80 bpm  74 bpm  80 bpm   Heart Rate (Exercise)  88 bpm  77 bpm  94 bpm  88 bpm  88 bpm   Heart Rate (Exit)  74 bpm  92 bpm  75 bpm  86 bpm  83 bpm   Oxygen Saturation (Admit)  96 %  93 %  96 %  93 %  94 %   Oxygen Saturation (Exercise)  95 %  94 %  90 %  94 %  95 %   Oxygen Saturation (Exit)  96 %  96 %  95 %  96 %  93 %   Rating of Perceived Exertion (Exercise)  _6 Perceived Dyspnea (Exercise)  _7 Duration  Progress to 45 minutes of aerobic exercise without signs/symptoms of physical distress  Progress to 45 minutes of aerobic exercise without signs/symptoms of physical distress  Progress to 45 minutes of aerobic exercise without signs/symptoms of physical distress  Progress to 45 minutes of aerobic exercise without signs/symptoms of physical distress  Progress to 45 minutes of aerobic exercise without signs/symptoms of physical distress   Intensity  THRR unchanged  THRR unchanged  THRR unchanged  THRR unchanged  THRR unchanged     Progression   Progression  Continue to progress workloads to maintain intensity without signs/symptoms of physical distress.  Continue to progress workloads to maintain intensity without signs/symptoms of physical distress.  Continue to progress workloads to maintain  intensity without signs/symptoms of physical distress.  Continue to progress workloads to maintain intensity without signs/symptoms of physical distress.  Continue to progress workloads to maintain intensity without signs/symptoms of physical distress.     Resistance Training   Training Prescription  Yes  Yes  Yes  Yes  Yes   Weight  orange bands  orange bands  orange bands  orange bands  orange bands   Reps  10-15  10-15  10-15  10-15  10-15   Time  10 Minutes  10 Minutes  10 Minutes  10 Minutes  10 Minutes     NuStep   Level  3  4  -   4  5   Minutes  17  17  -  17  17   METs  2.4  2  -  1.4  1.7     Arm Ergometer   Level  3  3  -  5  6   Minutes  17  17  -  17  17   Row Name 08/05/17 1500 08/12/17 1559 08/14/17 1500 08/14/17 1546 08/19/17 1555     Response to Exercise   Blood Pressure (Admit)  100/60  100/62  -  98/62  98/62   Blood Pressure (Exercise)  122/66  104/62  -  98/56  97/67   Blood Pressure (Exit)  110/60  104/64  -  100/54  114/74   Heart Rate (Admit)  76 bpm  70 bpm  -  77 bpm  68 bpm   Heart Rate (Exercise)  94 bpm  99 bpm  -  86 bpm  100 bpm   Heart Rate (Exit)  65 bpm  70 bpm  -  82 bpm  90 bpm   Oxygen Saturation (Admit)  96 %  95 %  -  95 %  94 %   Oxygen Saturation (Exercise)  94 %  96 %  -  95 %  94 %   Oxygen Saturation (Exit)  91 %  95 %  -  96 %  94 %   Rating of Perceived Exertion (Exercise)  14  13  -  12  14   Perceived Dyspnea (Exercise)  3  3  -  2  3   Duration  Progress to 45 minutes of aerobic exercise without signs/symptoms of physical distress  Progress to 45 minutes of aerobic exercise without signs/symptoms of physical distress  -  Progress to 45 minutes of aerobic exercise without signs/symptoms of physical distress  Progress to 45 minutes of aerobic exercise without signs/symptoms of physical distress   Intensity  THRR unchanged  THRR unchanged  -  THRR unchanged  THRR unchanged     Progression   Progression  Continue to progress workloads to maintain intensity without signs/symptoms of physical distress.  Continue to progress workloads to maintain intensity without signs/symptoms of physical distress.  -  Continue to progress workloads to maintain intensity without signs/symptoms of physical distress.  Continue to progress workloads to maintain intensity without signs/symptoms of physical distress.     Resistance Training   Training Prescription  Yes  Yes  -  Yes  Yes   Weight  orange bands  orange bands  -  orange bands  orange bands   Reps  10-15  10-15  -  10-15  10-15   Time   10 Minutes  10 Minutes  -  10 Minutes  10 Minutes     NuStep  Level  5  5  -  5  5   Minutes  17  17  -  51  17   METs  2.2  1.9  -  2  2.1     Arm Ergometer   Level  6.5  6.5  -  -  6   Minutes  17  17  -  -  17     Track   Laps  -  -  -  -  8   Minutes  -  -  -  -  17     Home Exercise Plan   Plans to continue exercise at  -  [facility] (comment) water treading at [employer] and walking at home  -  -   Frequency  -  -  Add 2 additional days to program exercise sessions.  -  -   Row Name 08/21/17 1600 08/26/17 1500 08/28/17 1500 09/09/17 1500 09/16/17 1529     Response to Exercise   Blood Pressure (Admit)  92/68  104/64  100/60  96/64  144/64   Blood Pressure (Exercise)  112/62  110/60  130/74  110/66  120/74   Blood Pressure (Exit)  100/80  118/62  98/64  104/60  126/60   Heart Rate (Admit)  80 bpm  83 bpm  71 bpm  64 bpm  66 bpm   Heart Rate (Exercise)  98 bpm  110 bpm  88 bpm  94 bpm  88 bpm   Heart Rate (Exit)  86 bpm  78 bpm  69 bpm  87 bpm  61 bpm   Oxygen Saturation (Admit)  96 %  95 %  96 %  96 %  95 %   Oxygen Saturation (Exercise)  95 %  93 %  95 %  93 %  94 %   Oxygen Saturation (Exit)  96 %  95 %  94 %  93 %  91 %   Rating of Perceived Exertion (Exercise)  _0 Perceived Dyspnea (Exercise)  _1 Duration  Progress to 45 minutes of aerobic exercise without signs/symptoms of physical distress  Progress to 45 minutes of aerobic exercise without signs/symptoms of physical distress  Progress to 45 minutes of aerobic exercise without signs/symptoms of physical distress  Progress to 45 minutes of aerobic exercise without signs/symptoms of physical distress  Progress to 45 minutes of aerobic exercise without signs/symptoms of physical distress   Intensity  THRR unchanged  THRR unchanged  THRR unchanged  THRR unchanged  THRR unchanged     Progression   Progression  Continue to progress workloads to maintain intensity without signs/symptoms of  physical distress.  Continue to progress workloads to maintain intensity without signs/symptoms of physical distress.  Continue to progress workloads to maintain intensity without signs/symptoms of physical distress.  Continue to progress workloads to maintain intensity without signs/symptoms of physical distress.  Continue to progress workloads to maintain intensity without signs/symptoms of physical distress.     Resistance Training   Training Prescription  Yes  Yes  Yes  Yes  Yes   Weight  orange bands  orange bands  orange bands  orange bands  orange bands   Reps  10-15  10-15  10-15  10-15  10-15   Time  10 Minutes  10 Minutes  10 Minutes  10 Minutes  10  Minutes     NuStep   Level  _0 Minutes  _1 34   METs  1.2  2.3  2  2.2  2.2     Arm Ergometer   Level  6  6  6.5  -  -   Minutes  _2 -  -     Track   Laps  -  9  -  12  12   Minutes  -  17  -  17  17   Row Name 09/23/17 1500 09/30/17 1500 10/02/17 1613 10/21/17 1500 11/04/17 1500     Response to Exercise   Blood Pressure (Admit)  126/84  106/60  142/80  120/80  114/70   Blood Pressure (Exercise)  104/60  122/70  140/66  100/66  102/60   Blood Pressure (Exit)  100/70  102/66  124/80  90/46  138/80   Heart Rate (Admit)  71 bpm  68 bpm  87 bpm  73 bpm  84 bpm   Heart Rate (Exercise)  94 bpm  96 bpm  104 bpm  96 bpm  99 bpm   Heart Rate (Exit)  64 bpm  66 bpm  59 bpm  67 bpm  93 bpm   Oxygen Saturation (Admit)  95 %  96 %  92 %  94 %  96 %   Oxygen Saturation (Exercise)  93 %  94 %  95 %  94 %  94 %   Oxygen Saturation (Exit)  95 %  92 %  96 %  96 %  93 %   Rating of Perceived Exertion (Exercise)  _3 Perceived Dyspnea (Exercise)  _4 Duration  Progress to 45 minutes of aerobic exercise without signs/symptoms of physical distress  Progress to 45 minutes of aerobic exercise without signs/symptoms of physical distress  Progress to 45 minutes of aerobic exercise without  signs/symptoms of physical distress  Progress to 45 minutes of aerobic exercise without signs/symptoms of physical distress  Progress to 45 minutes of aerobic exercise without signs/symptoms of physical distress   Intensity  THRR unchanged  THRR unchanged  THRR unchanged  THRR unchanged  THRR unchanged     Progression   Progression  Continue to progress workloads to maintain intensity without signs/symptoms of physical distress.  Continue to progress workloads to maintain intensity without signs/symptoms of physical distress.  Continue to progress workloads to maintain intensity without signs/symptoms of physical distress.  Continue to progress workloads to maintain intensity without signs/symptoms of physical distress.  Continue to progress workloads to maintain intensity without signs/symptoms of physical distress.     Resistance Training   Training Prescription  Yes  Yes  Yes  Yes  Yes   Weight  orange bands  orange bands  orange bands  orange bands  orange bands   Reps  10-15  10-15  10-15  10-15  10-15   Time  10 Minutes  10 Minutes  10 Minutes  10 Minutes  10 Minutes     Interval Training   Interval Training  -  No  No  No  No     NuStep   Level  -  _5 Minutes  -  17  _0 METs  -  2.3  2.1  3.2  2.3     Arm Ergometer   Level  6.5  6.5  5 workload reduced related to level 6.5 causes shoulder pain  5 workload reduced related to level 6.5 causes shoulder pain  5   Minutes  34  _1 Track   Laps  13  11  -  11  7   Minutes  17  17  -  17  17   Row Name 11/06/17 1500 11/13/17 1533           Response to Exercise   Blood Pressure (Admit)  112/74  112/54      Blood Pressure (Exercise)  100/70  110/66      Blood Pressure (Exit)  94/64  100/60      Heart Rate (Admit)  71 bpm  70 bpm      Heart Rate (Exercise)  106 bpm  112 bpm      Heart Rate (Exit)  69 bpm  75 bpm      Oxygen Saturation (Admit)  95 %  95 %      Oxygen Saturation (Exercise)  95 %   95 %      Oxygen Saturation (Exit)  97 %  93 %      Rating of Perceived Exertion (Exercise)  13  13      Perceived Dyspnea (Exercise)  1  3      Duration  Progress to 45 minutes of aerobic exercise without signs/symptoms of physical distress  Progress to 45 minutes of aerobic exercise without signs/symptoms of physical distress      Intensity  THRR unchanged  THRR unchanged        Progression   Progression  Continue to progress workloads to maintain intensity without signs/symptoms of physical distress.  Continue to progress workloads to maintain intensity without signs/symptoms of physical distress.        Resistance Training   Training Prescription  Yes  Yes      Weight  orange bands  orange bands      Reps  10-15  10-15      Time  10 Minutes  10 Minutes        Interval Training   Interval Training  No  No        NuStep   Level  6  6      Minutes  17  17      METs  2.6  2.3        Arm Ergometer   Level  -  6      Minutes  -  17        Track   Laps  13  14      Minutes  17  17         Exercise Comments: Exercise Comments    Row Name 08/14/17 1530           Exercise Comments  home exercise completed          Exercise Goals and Review:   Exercise Goals Re-Evaluation : Exercise Goals Re-Evaluation    [place] Name 06/27/17 1042 07/29/17 1005 08/18/17 1402 09/20/17 1337 10/13/17 1638     Exercise Goal Re-Evaluation   Exercise Goals Review  Increase Physical Activity;Increase Strenth and Stamina  Increase Physical Activity;Increase Strength and Stamina;Able to understand and  use Dyspnea scale;Able to understand and use rate of perceived exertion (RPE) scale;Knowledge and understanding of Target Heart Rate Range (THRR);Understanding of Exercise Prescription  Increase Strength and Stamina;Increase Physical Activity;Able to understand and use Dyspnea scale;Able to understand and use rate of perceived exertion (RPE) scale;Knowledge and understanding of Target Heart Rate Range  (THRR);Understanding of Exercise Prescription  Increase Physical Activity;Increase Strength and Stamina;Able to understand and use Dyspnea scale;Able to understand and use rate of perceived exertion (RPE) scale;Knowledge and understanding of Target Heart Rate Range (THRR);Understanding of Exercise Prescription  Increase Strength and Stamina;Increase Physical Activity;Able to understand and use rate of perceived exertion (RPE) scale;Knowledge and understanding of Target Heart Rate Range (THRR);Understanding of Exercise Prescription;Able to understand and use Dyspnea scale   Comments  Patient has only attended two exercise sessions. Will cont. to monitor and progress as able.   Patient is progressing slowly in program. She is limited by orthopedic issues. Will cont. to progress as able.  Patient is doing well in program. She is showing new initiative to push herself. Home exercise completed. Hesitant to exercise at home. Will cont. to monitor and motivate patient.   Patient has been progressing well. Mets still range in "low" level. Works hard while she is here. Patient has missed a few sessions recently because her daughter is in the hospital.  Patient is motivated to work hard and make improvements. Daughter has just past away. Wants to cont. to come to rehab. We have extended her to 36 sessions. Will cont. to monitor and progress as able.    Expected Outcomes  Through exercising at rehab and at home, patient will increase physical strength and stamina and find ADL's easier to perform.   Through exercising at rehab and at home, patient will increase physical strength and stamina and find ADL's easier to perform.   Through exercising at rehab and at home, patient will increase physical strength and stamina and find ADL's easier to perform.   Through exercising at rehab and at home, patient will increase physical strength and stamina and find ADL's easier to perform.   Through exercise at rehab and at home, patient  will increase strength and stamina and will find that ADL's are easier to preform.    [place] Name 11/03/17 1522 11/20/17 0955           Exercise Goal Re-Evaluation   Exercise Goals Review  Increase Physical Activity;Increase Strength and Stamina;Able to understand and use Dyspnea scale;Able to understand and use rate of perceived exertion (RPE) scale;Knowledge and understanding of Target Heart Rate Range (THRR);Understanding of Exercise Prescription  Increase Strength and Stamina;Increase Physical Activity;Able to understand and use rate of perceived exertion (RPE) scale;Knowledge and understanding of Target Heart Rate Range (THRR);Understanding of Exercise Prescription;Able to understand and use Dyspnea scale      Comments  Patient is motivated to work hard and make improvements. Daughter has just past away. Patient had a bad fall that broke her nose. Wants to cont. to come to rehab. We have extended her to 36 sessions. Will cont. to monitor and progress as able.   Patient is motivated to work hard and make improvements. Daughter has just past away. Patient had a bad fall that broke her nose. Wants to cont. to come to rehab. We have extended her to 36 sessions. Will cont. to monitor and progress as able.       Expected Outcomes  Through exercise at rehab and at home, patient will increase strength and stamina and  find that ADL's are easier to preform.    Through exercise at rehab and at home, patient will increase strength and stamina and be able to perform ADL's easier.          Discharge Exercise Prescription (Final Exercise Prescription Changes): Exercise Prescription Changes - 11/13/17 1533      Response to Exercise   Blood Pressure (Admit)  112/54    Blood Pressure (Exercise)  110/66    Blood Pressure (Exit)  100/60    Heart Rate (Admit)  70 bpm    Heart Rate (Exercise)  112 bpm    Heart Rate (Exit)  75 bpm    Oxygen Saturation (Admit)  95 %    Oxygen Saturation (Exercise)  95 %    Oxygen  Saturation (Exit)  93 %    Rating of Perceived Exertion (Exercise)  13    Perceived Dyspnea (Exercise)  3    Duration  Progress to 45 minutes of aerobic exercise without signs/symptoms of physical distress    Intensity  THRR unchanged      Progression   Progression  Continue to progress workloads to maintain intensity without signs/symptoms of physical distress.      Resistance Training   Training Prescription  Yes    Weight  orange bands    Reps  10-15    Time  10 Minutes      Interval Training   Interval Training  No      NuStep   Level  6    Minutes  17    METs  2.3      Arm Ergometer   Level  6    Minutes  17      Track   Laps  14    Minutes  17       Nutrition:  Target Goals: Understanding of nutrition guidelines, daily intake of sodium <1559m, cholesterol <2036m calories 30% from fat and 7% or less from saturated fats, daily to have 5 or more servings of fruits and vegetables.  Biometrics:    Nutrition Therapy Plan and Nutrition Goals: Nutrition Therapy & Goals - 06/19/17 0842      Nutrition Therapy   Diet  General, Healthful      Personal Nutrition Goals   Nutrition Goal  Identify food quantities necessary to achieve wt loss of  -2# per week to a goal wt loss of 2.7-10.9 kg (6-24 lb) at graduation from pulmonary rehab.      Intervention Plan   Intervention  Prescribe, educate and counsel regarding individualized specific dietary modifications aiming towards targeted core components such as weight, hypertension, lipid management, diabetes, heart failure and other comorbidities.    Expected Outcomes  Short Term Goal: Understand basic principles of dietary content, such as calories, fat, sodium, cholesterol and nutrients.;Long Term Goal: Adherence to prescribed nutrition plan.       Nutrition Discharge: Rate Your Plate Scores: Nutrition Assessments - 06/19/17 0836      Rate Your Plate Scores   Pre Score  46       Nutrition Goals  Re-Evaluation:   Nutrition Goals Discharge (Final Nutrition Goals Re-Evaluation):   Psychosocial: Target Goals: Acknowledge presence or absence of significant depression and/or stress, maximize coping skills, provide positive support system. Participant is able to verbalize types and ability to use techniques and skills needed for reducing stress and depression.  Initial Review & Psychosocial Screening: Initial Psych Review & Screening - 06/09/17 1328      Initial Review  Current issues with  None Identified      Family Dynamics   Good Support System?  Yes      Barriers   Psychosocial barriers to participate in program  There are no identifiable barriers or psychosocial needs.      Screening Interventions   Interventions  Encouraged to exercise       Quality of Life Scores:   PHQ-9: Recent Review Flowsheet Data    Depression screen [facility] 2/9 09/17/2017 06/09/2017 01/15/2017 07/15/2016   Decreased Interest 2 3 0 0   Down, Depressed, Hopeless 2 - 0  0   PHQ - 2 Score 4 3 0 0   Altered sleeping 0 0 - -   Tired, decreased energy 3 3 - -   Change in appetite 3 3 - -   Feeling bad or failure about yourself  2 3 - -   Trouble concentrating 3 0 - -   Moving slowly or fidgety/restless 0 0 - -   Suicidal thoughts 0 0 - -   PHQ-9 Score 15 12 - -   Difficult doing work/chores Very difficult Not difficult at all - -     Interpretation of Total Score  Total Score Depression Severity:  1-4 = Minimal depression, 5-9 = Mild depression, 10-14 = Moderate depression, 15-19 = Moderately severe depression, 20-27 = Severe depression   Psychosocial Evaluation and Intervention: Psychosocial Evaluation - 06/09/17 1330      Psychosocial Evaluation & Interventions   Interventions  Encouraged to exercise with the program and follow exercise prescription    Comments  dealing depression, but is working on making herself happy.    Continue Psychosocial Services   No Follow up required        Psychosocial Re-Evaluation: Psychosocial Re-Evaluation    [place] Name 07/01/17 1926 07/30/17 2105 08/22/17 1357 09/22/17 0836 10/14/17 1545     Psychosocial Re-Evaluation   Current issues with  None Identified  None Identified  Current Stress Concerns  Current Stress Concerns  Current Stress Concerns her daughter died after a long illness   Comments  -  -  patient worries about dauther who has terminal pulmonary disease and requires high flow oxygen. She worries that she is unable to help her daughter because of her own shortness of breath and deconditioning.   patient worries about dauther who has terminal pulmonary disease and requires high flow oxygen. She worries that she is unable to help her daughter because of her own shortness of breath and deconditioning.  her daughter has been hospitalized over the last week since she ran out of oxygen from the [place]aine and the patient has not been at her rehab sessions.   She has not returned to the program since her daughter died, unable to evaluate psychosocial issue(s)   Expected Outcomes  -  patient will remain free from psychosocial barriers to participation  patient will remain free from psychosocial barriers to participation  patient will remain free from psychosocial barriers to participation  patient will remain free from psychosocial barriers to participation   Interventions  -  Encouraged to attend Pulmonary Rehabilitation for the exercise  Encouraged to attend Pulmonary Rehabilitation for the exercise  Encouraged to attend Pulmonary Rehabilitation for the exercise  Encouraged to attend Pulmonary Rehabilitation for the exercise   Continue Psychosocial Services   -  No Follow up required  No Follow up required  Follow up required by staff  Follow up required by staff   Comments  -  -  daughters terminal pulmonary illness  daughters terminal pulmonary illness  now she is dealing with her daughter's death     Initial Review   Source of Stress  Concerns  -  -  Family  Family  Family   [place] Name 11/04/17 1558 11/21/17 0847           Psychosocial Re-Evaluation   Current issues with  Current Stress Concerns  Current Stress Concerns      Comments  going through the grief process, has good and bad days, but overall is doing well  going through the grief process, has good and bad days, but overall is doing well      Expected Outcomes  patient will remain free from psychosocial barriers to participation  patient will remain free from psychosocial barriers to participation      Interventions  Encouraged to attend Pulmonary Rehabilitation for the exercise  Encouraged to attend Pulmonary Rehabilitation for the exercise      Continue Psychosocial Services   Follow up required by staff  Follow up required by staff      Comments  -  still dealing with her daughter's death        Initial Review   Source of Stress Concerns  Family  Family         Psychosocial Discharge (Final Psychosocial Re-Evaluation): Psychosocial Re-Evaluation - 11/21/17 0847      Psychosocial Re-Evaluation   Current issues with  Current Stress Concerns    Comments  going through the grief process, has good and bad days, but overall is doing well    Expected Outcomes  patient will remain free from psychosocial barriers to participation    Interventions  Encouraged to attend Pulmonary Rehabilitation for the exercise    Continue Psychosocial Services   Follow up required by staff    Comments  still dealing with her daughter's death      Initial Review   Source of Stress Concerns  Family       Education: Education Goals: Education classes will be provided on a weekly basis, covering required topics. Participant will state understanding/return demonstration of topics presented.  Learning Barriers/Preferences: Learning Barriers/Preferences - 06/09/17 1314      Learning Barriers/Preferences   Learning Barriers  None    Learning Preferences  Pictoral        Education Topics: Risk Factor Reduction:  -Group instruction that is supported by a PowerPoint presentation. Instructor discusses the definition of a risk factor, different risk factors for pulmonary disease, and how the heart and lungs work together.     PULMONARY REHAB OTHER RESPIRATORY from 11/06/2017 in [place]  Date  06/26/17  Educator  ep  Instruction Review Code  2- meets goals/outcomes      Nutrition for Pulmonary Patient:  -Group instruction provided by PowerPoint slides, verbal discussion, and written materials to support subject matter. The instructor gives an explanation and review of healthy diet recommendations, which includes a discussion on weight management, recommendations for fruit and vegetable consumption, as well as protein, fluid, caffeine, fiber, sodium, sugar, and alcohol. Tips for eating when patients are short of breath are discussed.   PULMONARY REHAB OTHER RESPIRATORY from 11/06/2017 in [place]  Date  07/24/17  Educator  edna  Instruction Review Code  2- meets goals/outcomes      Pursed Lip Breathing:  -Group instruction that is supported by demonstration and informational handouts. Instructor discusses the benefits of pursed  lip and diaphragmatic breathing and detailed demonstration on how to preform both.     Oxygen Safety:  -Group instruction provided by PowerPoint, verbal discussion, and written material to support subject matter. There is an overview of "What is Oxygen" and "Why do we need it".  Instructor also reviews how to create a safe environment for oxygen use, the importance of using oxygen as prescribed, and the risks of noncompliance. There is a brief discussion on traveling with oxygen and resources the patient may utilize.   PULMONARY REHAB OTHER RESPIRATORY from 11/06/2017 in [place]  Date  09/25/17  Educator  Truddie Crumble  Instruction Review  Code  2- meets goals/outcomes      Oxygen Equipment:  -Group instruction provided by [employer] Staff utilizing handouts, written materials, and equipment demonstrations.   Signs and Symptoms:  -Group instruction provided by written material and verbal discussion to support subject matter. Warning signs and symptoms of infection, stroke, and heart attack are reviewed and when to call the physician/911 reinforced. Tips for preventing the spread of infection discussed.   PULMONARY REHAB OTHER RESPIRATORY from 11/06/2017 in [place]  Date  08/21/17  Educator  rn  Instruction Review Code  2- meets goals/outcomes      Advanced Directives:  -Group instruction provided by verbal instruction and written material to support subject matter. Instructor reviews Advanced Directive laws and proper instruction for filling out document.   Pulmonary Video:  -Group video education that reviews the importance of medication and oxygen compliance, exercise, good nutrition, pulmonary hygiene, and pursed lip and diaphragmatic breathing for the pulmonary patient.   PULMONARY REHAB OTHER RESPIRATORY from 11/06/2017 in [place]  Date  08/28/17  Instruction Review Code  2- meets goals/outcomes      Exercise for the Pulmonary Patient:  -Group instruction that is supported by a PowerPoint presentation. Instructor discusses benefits of exercise, core components of exercise, frequency, duration, and intensity of an exercise routine, importance of utilizing pulse oximetry during exercise, safety while exercising, and options of places to exercise outside of rehab.     PULMONARY REHAB OTHER RESPIRATORY from 11/06/2017 in [place]  Date  08/12/17  Educator  ep  Instruction Review Code  2- meets goals/outcomes      Pulmonary Medications:  -Verbally interactive group education provided by instructor with focus on  inhaled medications and proper administration.   PULMONARY REHAB OTHER RESPIRATORY from 11/06/2017 in [place]  Date  10/02/17  Educator  pharmacist  Instruction Review Code  R- Review/reinforce      Anatomy and Physiology of the Respiratory System and Intimacy:  -Group instruction provided by PowerPoint, verbal discussion, and written material to support subject matter. Instructor reviews respiratory cycle and anatomical components of the respiratory system and their functions. Instructor also reviews differences in obstructive and restrictive respiratory diseases with examples of each. Intimacy, Sex, and Sexuality differences are reviewed with a discussion on how relationships can change when diagnosed with pulmonary disease. Common sexual concerns are reviewed.   MD DAY -A group question and answer session with a medical doctor that allows participants to ask questions that relate to their pulmonary disease state.   PULMONARY REHAB OTHER RESPIRATORY from 11/06/2017 in [place]  Date  09/09/17  Educator  yacoub  Instruction Review Code  2- meets goals/outcomes      OTHER EDUCATION -  Group or individual verbal, written, or video instructions that support the educational goals of the pulmonary rehab program.   Knowledge Questionnaire Score:   Core Components/Risk Factors/Patient Goals at Admission: Personal Goals and Risk Factors at Admission - 06/09/17 1325      Core Components/Risk Factors/Patient Goals on Admission    Weight Management  Obesity;Yes    Intervention  Weight Management: Develop a combined nutrition and exercise program designed to reach desired caloric intake, while maintaining appropriate intake of nutrient and fiber, sodium and fats, and appropriate energy expenditure required for the weight goal.    Admit Weight  276 lb 0.3 oz (125.2 kg)    Goal Weight: Short Term  271 lb 2.7 oz (123 kg)    Goal  Weight: Long Term  264 lb 8.8 oz (120 kg)    Expected Outcomes  Short Term: Continue to assess and modify interventions until short term weight is achieved    Improve shortness of breath with ADL's  Yes    Intervention  Provide education, individualized exercise plan and daily activity instruction to help decrease symptoms of SOB with activities of daily living.    Expected Outcomes  Short Term: Achieves a reduction of symptoms when performing activities of daily living.    Develop more efficient breathing techniques such as purse lipped breathing and diaphragmatic breathing; and practicing self-pacing with activity  Yes    Intervention  Provide education, demonstration and support about specific breathing techniuqes utilized for more efficient breathing. Include techniques such as pursed lipped breathing, diaphragmatic breathing and self-pacing activity.    Expected Outcomes  Short Term: Participant will be able to demonstrate and use breathing techniques as needed throughout daily activities.       Core Components/Risk Factors/Patient Goals Review:  Goals and Risk Factor Review    [place] Name 07/01/17 1924 07/30/17 2104 08/22/17 1357 09/22/17 0834 10/14/17 1543     Core Components/Risk Factors/Patient Goals Review   Personal Goals Review  -  Weight Management/Obesity;Improve shortness of breath with ADL's;Develop more efficient breathing techniques such as purse lipped breathing and diaphragmatic breathing and practicing self-pacing with activity.  Weight Management/Obesity;Improve shortness of breath with ADL's;Develop more efficient breathing techniques such as purse lipped breathing and diaphragmatic breathing and practicing self-pacing with activity.  -  Weight Management/Obesity;Improve shortness of breath with ADL's;Develop more efficient breathing techniques such as purse lipped breathing and diaphragmatic breathing and practicing self-pacing with activity.   Review  patient has only attended  3 exercise sessions since admission and is too soon to evaluate progression towards rehab goals  patient doing well in rehab. tolerating workload increases and utilizing PLB for her shortness of breath  patient doing well in rehab. tolerating workload increases and utilizing PLB for her shortness of breath  patient doing well in rehab. tolerating workload increases and utilizing PLB for her shortness of breath. she is s/p cardioversion two weeks ago but is unable to tell a difference in how she feels.   Has been doing well in pulmonary rehab physically, but her daughter died 2 weeks ago and she has been absent.  She is nearing graduation.   Expected Outcomes  see admission expected outcomes  see admission expected outcomes  see admission expected outcomes  see admission expected outcomes  see admission expected outcomes   [place] Name 11/04/17 1550 11/21/17 0845           Core Components/Risk Factors/Patient Goals Review   Personal Goals Review  Weight Management/Obesity;Improve shortness of breath  with ADL's;Develop more efficient breathing techniques such as purse lipped breathing and diaphragmatic breathing and practicing self-pacing with activity.  Weight Management/Obesity;Improve shortness of breath with ADL's;Develop more efficient breathing techniques such as purse lipped breathing and diaphragmatic breathing and practicing self-pacing with activity.      Review  She has lost 7 kg, is going through the grief process and has her good and bad days and is in the process of possibly moving to an assisted living facility, she fell at home and broke her nose  and is recovering from this, otherwise is progressing well.  Maintaing the 7 kg weight loss, back exercising with [place], but had a viral infection the last week and was not able to exercise with [place].      Expected Outcomes  to continue to progress as on admission outcomes  to continue to progress as on admission outcomes         Core Components/Risk  Factors/Patient Goals at Discharge (Final Review):  Goals and Risk Factor Review - 11/21/17 0845      Core Components/Risk Factors/Patient Goals Review   Personal Goals Review  Weight Management/Obesity;Improve shortness of breath with ADL's;Develop more efficient breathing techniques such as purse lipped breathing and diaphragmatic breathing and practicing self-pacing with activity.    Review  Maintaing the 7 kg weight loss, back exercising with [place], but had a viral infection the last week and was not able to exercise with [place].    Expected Outcomes  to continue to progress as on admission outcomes       ITP Comments:   Comments: ITP REVIEW Pt is making expected progress toward pulmonary rehab goals after completing 27 sessions. Recommend continued exercise, life style modification, education, and utilization of breathing techniques to increase stamina and strength and decrease shortness of breath with exertion.

## 2017-11-27 NOTE — Progress Notes (Signed)
Daily Session Note  Patient Details  Name: [name] [name] MRN: [id number] Date of Birth: [birth year] Referring Provider:     Pulmonary Rehab Walk Test from 06/17/2017 in [place]  Referring Provider  Dr. Annamaria Boots      Encounter Date: 11/27/2017  Check In: Session Check In - 11/27/17 1330      Check-In   Location  [facility]    Staff Present  Rosebud Poles, RN, BSN;Lisa Ysidro Evert, RN;Arn Mcomber Rollene Rotunda, RN, BSN    Supervising physician immediately available to respond to emergencies  [facility] immediately available    Physician(s)  Dr. Rockne Menghini    Medication changes reported      No    Fall or balance concerns reported     No    Tobacco Cessation  No Change    Warm-up and Cool-down  Performed as group-led instruction    Resistance Training Performed  Yes    VAD Patient?  No      Pain Assessment   Currently in Pain?  No/denies    Multiple Pain Sites  No       Capillary Blood Glucose: No results found for this or any previous visit (from the past 24 hour(s)).  Exercise Prescription Changes - 11/27/17 1500      Response to Exercise   Blood Pressure (Admit)  100/60    Blood Pressure (Exercise)  100/60    Blood Pressure (Exit)  106/60    Heart Rate (Admit)  77 bpm    Heart Rate (Exercise)  105 bpm    Heart Rate (Exit)  63 bpm    Oxygen Saturation (Admit)  95 %    Oxygen Saturation (Exercise)  93 %    Oxygen Saturation (Exit)  95 %    Rating of Perceived Exertion (Exercise)  13    Perceived Dyspnea (Exercise)  3    Duration  Progress to 45 minutes of aerobic exercise without signs/symptoms of physical distress    Intensity  THRR unchanged      Progression   Progression  Continue to progress workloads to maintain intensity without signs/symptoms of physical distress.      Resistance Training   Training Prescription  Yes    Weight  orange bands    Reps  10-15    Time  10 Minutes      Interval Training   Interval Training   No      NuStep   Level  6    Minutes  17    METs  2.3      Arm Ergometer   Level  5.2    Minutes  17      Track   Laps  14    Minutes  17       Social History   Tobacco Use  Smoking Status Former Smoker  . Packs/day: 2.00  . Years: 18.00  . Pack years: 36.00  . Types: Cigarettes  Smokeless Tobacco Never Used  Tobacco Comment   "quit smoking in 1971"    Goals Met:  Independence with exercise equipment Improved SOB with ADL's Using PLB without cueing & demonstrates good technique Exercise tolerated well No report of cardiac concerns or symptoms Strength training completed today  Goals Unmet:  Not Applicable  Comments: Service time is from 1330 to 1515   Dr. Rush Farmer is Medical Director for Pulmonary Rehab at [facility].

## 2017-12-02 HISTORY — PX: COLONOSCOPY: SHX174

## 2017-12-04 ENCOUNTER — Encounter (HOSPITAL_COMMUNITY)
Admission: RE | Admit: 2017-12-04 | Discharge: 2017-12-04 | Disposition: A | Discharge status: Home / Self Care | Payer: Medicare Other | Source: Ambulatory Visit | Attending: Internal Medicine | Admitting: Internal Medicine

## 2017-12-04 ENCOUNTER — Ambulatory Visit (HOSPITAL_COMMUNITY): Payer: Medicare Other

## 2017-12-04 DIAGNOSIS — Z7951 Long term (current) use of inhaled steroids: Secondary | ICD-10-CM | POA: Insufficient documentation

## 2017-12-04 DIAGNOSIS — Z6841 Body Mass Index (BMI) 40.0 and over, adult: Secondary | ICD-10-CM | POA: Insufficient documentation

## 2017-12-04 DIAGNOSIS — Z7901 Long term (current) use of anticoagulants: Secondary | ICD-10-CM | POA: Diagnosis not present

## 2017-12-04 DIAGNOSIS — Z8673 Personal history of transient ischemic attack (TIA), and cerebral infarction without residual deficits: Secondary | ICD-10-CM | POA: Insufficient documentation

## 2017-12-04 DIAGNOSIS — I495 Sick sinus syndrome: Secondary | ICD-10-CM | POA: Diagnosis not present

## 2017-12-04 DIAGNOSIS — Z87891 Personal history of nicotine dependence: Secondary | ICD-10-CM | POA: Diagnosis not present

## 2017-12-04 DIAGNOSIS — Z950 Presence of cardiac pacemaker: Secondary | ICD-10-CM | POA: Diagnosis not present

## 2017-12-04 DIAGNOSIS — I4891 Unspecified atrial fibrillation: Secondary | ICD-10-CM | POA: Insufficient documentation

## 2017-12-04 DIAGNOSIS — K219 Gastro-esophageal reflux disease without esophagitis: Secondary | ICD-10-CM | POA: Insufficient documentation

## 2017-12-04 DIAGNOSIS — Z79899 Other long term (current) drug therapy: Secondary | ICD-10-CM | POA: Diagnosis not present

## 2017-12-04 DIAGNOSIS — R0609 Other forms of dyspnea: Secondary | ICD-10-CM | POA: Diagnosis not present

## 2017-12-04 DIAGNOSIS — I341 Nonrheumatic mitral (valve) prolapse: Secondary | ICD-10-CM | POA: Insufficient documentation

## 2017-12-04 DIAGNOSIS — I10 Essential (primary) hypertension: Secondary | ICD-10-CM | POA: Diagnosis not present

## 2017-12-04 DIAGNOSIS — E669 Obesity, unspecified: Secondary | ICD-10-CM | POA: Diagnosis not present

## 2017-12-04 DIAGNOSIS — M1990 Unspecified osteoarthritis, unspecified site: Secondary | ICD-10-CM | POA: Diagnosis not present

## 2017-12-04 NOTE — Progress Notes (Signed)
Daily Session Note  Patient Details  Name: [name] [name] MRN: [id number] Date of Birth: 1941/02/21 Referring Provider:     Pulmonary Rehab Walk Test from 06/17/2017 in [place]  Referring Provider  Dr. Annamaria Boots      Encounter Date: 12/04/2017  Check In: Session Check In - 12/04/17 1404      Check-In   Location  [facility]    Staff Present  Rosebud Poles, RN, BSN;Lisa Ysidro Evert, RN;Portia Rollene Rotunda, RN, BSN;Molly diVincenzo, MS, ACSM RCEP, Exercise Physiologist    Supervising physician immediately available to respond to emergencies  [facility] immediately available    Physician(s)  Dr. Eliseo Squires    Medication changes reported      No    Fall or balance concerns reported     No    Tobacco Cessation  No Change    Warm-up and Cool-down  Performed as group-led instruction    Resistance Training Performed  Yes    VAD Patient?  No      Pain Assessment   Currently in Pain?  No/denies    Multiple Pain Sites  No       Capillary Blood Glucose: No results found for this or any previous visit (from the past 24 hour(s)).  Exercise Prescription Changes - 12/04/17 1600      Response to Exercise   Blood Pressure (Admit)  104/56    Blood Pressure (Exercise)  158/70    Blood Pressure (Exit)  100/60    Heart Rate (Admit)  85 bpm    Heart Rate (Exercise)  112 bpm    Heart Rate (Exit)  81 bpm    Oxygen Saturation (Admit)  96 %    Oxygen Saturation (Exercise)  94 %    Oxygen Saturation (Exit)  96 %    Rating of Perceived Exertion (Exercise)  13    Perceived Dyspnea (Exercise)  3    Duration  Progress to 45 minutes of aerobic exercise without signs/symptoms of physical distress    Intensity  THRR unchanged      Progression   Progression  Continue to progress workloads to maintain intensity without signs/symptoms of physical distress.      Resistance Training   Training Prescription  Yes    Weight  orange bands    Reps  10-15    Time  10  Minutes      Interval Training   Interval Training  No      Arm Ergometer   Level  5.2    Minutes  17      Track   Laps  16    Minutes  17       Social History   Tobacco Use  Smoking Status Former Smoker  . Packs/day: 2.00  . Years: 18.00  . Pack years: 36.00  . Types: Cigarettes  Smokeless Tobacco Never Used  Tobacco Comment   "quit smoking in 1971"    Goals Met:  Exercise tolerated well Strength training completed today  Goals Unmet:  Not Applicable  Comments: Service time is from 1330 to [place]    Dr. Rush Farmer is Medical Director for Pulmonary Rehab at [facility].

## 2017-12-09 ENCOUNTER — Encounter (HOSPITAL_COMMUNITY)
Admission: RE | Admit: 2017-12-09 | Discharge: 2017-12-09 | Disposition: A | Discharge status: Home / Self Care | Payer: Medicare Other | Source: Ambulatory Visit | Attending: Internal Medicine | Admitting: Internal Medicine

## 2017-12-09 ENCOUNTER — Ambulatory Visit (HOSPITAL_COMMUNITY): Payer: Medicare Other

## 2017-12-09 VITALS — Wt 258.6 lb

## 2017-12-09 DIAGNOSIS — Z7951 Long term (current) use of inhaled steroids: Secondary | ICD-10-CM | POA: Diagnosis not present

## 2017-12-09 DIAGNOSIS — M1990 Unspecified osteoarthritis, unspecified site: Secondary | ICD-10-CM | POA: Diagnosis not present

## 2017-12-09 DIAGNOSIS — Z950 Presence of cardiac pacemaker: Secondary | ICD-10-CM | POA: Diagnosis not present

## 2017-12-09 DIAGNOSIS — Z7901 Long term (current) use of anticoagulants: Secondary | ICD-10-CM | POA: Diagnosis not present

## 2017-12-09 DIAGNOSIS — R0609 Other forms of dyspnea: Secondary | ICD-10-CM | POA: Diagnosis not present

## 2017-12-09 DIAGNOSIS — Z79899 Other long term (current) drug therapy: Secondary | ICD-10-CM | POA: Diagnosis not present

## 2017-12-09 NOTE — Progress Notes (Signed)
Daily Session Note  Patient Details  Name: [name] [name] MRN: [id number] Date of Birth: 01/17/1941 Referring Provider:     Pulmonary Rehab Walk Test from 06/17/2017 in [place]  Referring Provider  Dr. Annamaria Boots      Encounter Date: 12/09/2017  Check In: Session Check In - 12/09/17 1450      Check-In   Location  [facility]    Staff Present  Rosebud Poles, RN, BSN;Molly diVincenzo, MS, ACSM RCEP, Exercise Physiologist;Khalfani Weideman Rollene Rotunda, RN, BSN    Supervising physician immediately available to respond to emergencies  [facility] immediately available    Physician(s)  Dr. Eliseo Squires    Medication changes reported      No    Fall or balance concerns reported     No    Tobacco Cessation  No Change    Warm-up and Cool-down  Performed as group-led instruction    Resistance Training Performed  Yes    VAD Patient?  No      Pain Assessment   Currently in Pain?  No/denies    Multiple Pain Sites  No       Capillary Blood Glucose: No results found for this or any previous visit (from the past 24 hour(s)).  Exercise Prescription Changes - 12/09/17 1614      Response to Exercise   Blood Pressure (Admit)  116/66    Blood Pressure (Exercise)  100/60    Blood Pressure (Exit)  114/64    Heart Rate (Admit)  78 bpm    Heart Rate (Exercise)  107 bpm    Heart Rate (Exit)  71 bpm    Oxygen Saturation (Admit)  97 %    Oxygen Saturation (Exercise)  95 %    Oxygen Saturation (Exit)  94 %    Rating of Perceived Exertion (Exercise)  15    Perceived Dyspnea (Exercise)  3    Duration  Progress to 45 minutes of aerobic exercise without signs/symptoms of physical distress    Intensity  THRR unchanged      Progression   Progression  Continue to progress workloads to maintain intensity without signs/symptoms of physical distress.      Resistance Training   Training Prescription  Yes    Weight  orange bands    Reps  10-15    Time  10 Minutes      Interval Training   Interval Training  No      NuStep   Level  6    Minutes  17    METs  2.3      Arm Ergometer   Level  5.2    Minutes  17      Track   Laps  16    Minutes  17       Social History   Tobacco Use  Smoking Status Former Smoker  . Packs/day: 2.00  . Years: 18.00  . Pack years: 36.00  . Types: Cigarettes  Smokeless Tobacco Never Used  Tobacco Comment   "quit smoking in 1971"    Goals Met:  Independence with exercise equipment Improved SOB with ADL's Using PLB without cueing & demonstrates good technique Exercise tolerated well No report of cardiac concerns or symptoms Strength training completed today  Goals Unmet:  Not Applicable  Comments: Service time is from 1330 to 1515   Dr. Rush Farmer is Medical Director for Pulmonary Rehab at [facility].

## 2017-12-11 ENCOUNTER — Encounter (HOSPITAL_COMMUNITY)
Admission: RE | Admit: 2017-12-11 | Discharge: 2017-12-11 | Disposition: A | Discharge status: Home / Self Care | Payer: Medicare Other | Source: Ambulatory Visit | Attending: Internal Medicine | Admitting: Internal Medicine

## 2017-12-11 ENCOUNTER — Ambulatory Visit (HOSPITAL_COMMUNITY): Payer: Medicare Other

## 2017-12-11 ENCOUNTER — Ambulatory Visit (INDEPENDENT_AMBULATORY_CARE_PROVIDER_SITE_OTHER): Payer: Medicare Other | Admitting: Family Medicine

## 2017-12-11 VITALS — Wt 257.3 lb

## 2017-12-11 VITALS — BP 102/67 | HR 64 | Temp 98.1°F | Ht 67.0 in | Wt 254.0 lb

## 2017-12-11 DIAGNOSIS — Z6839 Body mass index (BMI) 39.0-39.9, adult: Secondary | ICD-10-CM

## 2017-12-11 DIAGNOSIS — Z950 Presence of cardiac pacemaker: Secondary | ICD-10-CM | POA: Diagnosis not present

## 2017-12-11 DIAGNOSIS — Z7901 Long term (current) use of anticoagulants: Secondary | ICD-10-CM | POA: Diagnosis not present

## 2017-12-11 DIAGNOSIS — Z79899 Other long term (current) drug therapy: Secondary | ICD-10-CM | POA: Diagnosis not present

## 2017-12-11 DIAGNOSIS — K219 Gastro-esophageal reflux disease without esophagitis: Secondary | ICD-10-CM

## 2017-12-11 DIAGNOSIS — M1990 Unspecified osteoarthritis, unspecified site: Secondary | ICD-10-CM | POA: Diagnosis not present

## 2017-12-11 DIAGNOSIS — R0609 Other forms of dyspnea: Principal | ICD-10-CM

## 2017-12-11 DIAGNOSIS — Z7951 Long term (current) use of inhaled steroids: Secondary | ICD-10-CM | POA: Diagnosis not present

## 2017-12-11 NOTE — Progress Notes (Signed)
Office: [phone]  /  Fax: [phone]   HPI:   Chief Complaint: OBESITY [name] [name] is here to discuss her progress with her obesity treatment plan. She is on the Category 2 plan and is following her eating plan approximately 0 % of the time. She states she is swimming for 35 minutes 1 times per week. [name] [name] did well maintaining weight over the holidays. [name] [name] is deviating pretty significantly from her plan and she decreased her protein enough to decrease her metabolism. Her weight is 254 lb (115.2 kg) today and has maintained weight over a period of 3 weeks since her last visit. She has lost 10 lbs since starting treatment with [place].  Gastroesophageal Reflux Disease [name] [name] notes a recent increase in substernal discomfort that is worse when bending over and worse on an empty stomach. She has had increased belching. She denies chest pain or shortness of breath. [name] [name] is on Protonix as needed.  ALLERGIES: Allergies  Allergen Reactions  . Ancef [Cefazolin] Hives and Itching  . Pseudoephedrine Hcl     Other reaction(s): palpitations (moderate)  . Caffeine Palpitations    MEDICATIONS: Current Outpatient Medications on File Prior to Visit  Medication Sig Dispense Refill  . apixaban (ELIQUIS) 5 MG TABS tablet Take 1 tablet (5 mg total) by mouth 2 (two) times daily. 28 tablet 0  . Biotin 5000 MCG CAPS Take 5,000 mcg by mouth daily.     . Calcium Carbonate-Vit D-Min (CALCIUM 1200 PO) Take 2,400 mg by mouth daily.     . cholecalciferol (VITAMIN D) 1000 UNITS tablet Take 1,000 Units by mouth daily.     Marland Kitchen Co-Enzyme Q10 100 MG CAPS Take 100 mg by mouth daily.     . diazepam (VALIUM) 5 MG tablet TAKE 1 TABLET BY MOUTH ONCE DAILY AS NEEDED FOR ANXIETY 30 tablet 1  . folic acid (FOLVITE) 132 MCG tablet Take 400 mcg by mouth daily.    Marland Kitchen glucosamine-chondroitin 500-400 MG tablet Take 1 tablet by mouth daily.     Marland Kitchen loratadine (KLS ALLERCLEAR) 10 MG tablet Take 10 mg by mouth daily.    Marland Kitchen MAGNESIUM  CITRATE PO Take 250 mg by mouth daily. 1 tab daily    . metoprolol tartrate (LOPRESSOR) 25 MG tablet Take 0.5 tablets (12.5 mg total) by mouth 2 (two) times daily. 90 tablet 3  . nitroGLYCERIN (NITROSTAT) 0.4 MG SL tablet Place 0.4 mg under the tongue every 5 (five) minutes as needed for chest pain.    . pantoprazole (PROTONIX) 40 MG tablet TAKE 1 TABLET BY MOUTH DAILY 90 tablet 1  . Resveratrol 250 MG CAPS Take 250 mg by mouth daily.    . sertraline (ZOLOFT) 100 MG tablet Take 1 tablet (100 mg total) by mouth daily. 90 tablet 0  . Simethicone (GAS-X PO) Take 2 tablets by mouth daily as needed (bloating).     No current facility-administered medications on file prior to visit.     PAST MEDICAL HISTORY: Past Medical History:  Diagnosis Date  . Alpha-1-antitrypsin deficiency carrier ([place])   . Arthritis    "all over"  . Atrial fibrillation ([place])   . Back pain   . Complication of anesthesia    "I woke up during 2 different procedures" (10/04/2014)  . Frequent UTI   . GERD (gastroesophageal reflux disease)   . Hypertension   . MVP (mitral valve prolapse)   . Obesity   . Osteoarthritis   . Presence of permanent cardiac pacemaker   . PVC's (  premature ventricular contractions)   . Shortness of breath   . 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 Sleep apnea   . Stroke [facility]) 01/2014   denies deficits on 10/04/2014  . Tachy-brady syndrome ([place]) 10/2014  . TIA (transient ischemic attack)     PAST SURGICAL HISTORY: Past Surgical History:  Procedure Laterality Date  . CARDIOVERSION N/A 09/02/2017   Procedure: CARDIOVERSION;  Surgeon: Sanda Klein, MD;  [facility];  Service: Cardiovascular;  Laterality: N/A;  . EXCISION VAGINAL CYST     benign nodules  . INSERT / REPLACE / REMOVE PACEMAKER  10/04/2014   Medtronic Advisa model [id number] serial number [id number]  . LOOP RECORDER EXPLANT N/A 10/04/2014   Procedure: LOOP RECORDER  EXPLANT;  Surgeon: Sanda Klein, MD;  Location: [place] CATH LAB;  Service: Cardiovascular;  Laterality: N/A;  . LOOP RECORDER IMPLANT N/A 04/19/2014   Procedure: LOOP RECORDER IMPLANT;  Surgeon: Sanda Klein, MD;  Location: [place] CATH LAB;  Service: Cardiovascular;  Laterality: N/A;  . NM MYOCAR PERF WALL MOTION  12/18/2007   normal  . PERMANENT PACEMAKER INSERTION N/A 10/04/2014   Procedure: PERMANENT PACEMAKER INSERTION;  Surgeon: Sanda Klein, MD;  Location: [place] CATH LAB;  Service: Cardiovascular;  Laterality: N/A;  . TUBAL LIGATION    . US ECHOCARDIOGRAPHY  05/15/2010   LA mildly dilated,mild mitral annular ca+, AOV mildly sclerotic, mild asymmetric LVH    SOCIAL HISTORY: Social History   Tobacco Use  . Smoking status: Former Smoker    Packs/day: 2.00    Years: 18.00    Pack years: 36.00    Types: Cigarettes  . Smokeless tobacco: Never Used  . Tobacco comment: "quit smoking in 1971"  Substance Use Topics  . Alcohol use: Yes    Alcohol/week: 0.0 oz    Comment: 10/04/2014 "might have a drink a couple times/yr"  . Drug use: No    FAMILY HISTORY: Family History  Problem Relation Age of Onset  . Heart attack Mother   . Sudden death Mother   . Depression Mother   . Stroke Father   . Heart failure Father   . Depression Father   . Heart failure Brother   . Stroke Brother     ROS: Review of Systems  Constitutional: Negative for weight loss.  Respiratory: Negative for shortness of breath.   Cardiovascular: Negative for chest pain.       Positive substernal discomfort  Gastrointestinal:       Positive belching    PHYSICAL EXAM: Blood pressure 102/67, pulse 64, temperature 98.1 F (36.7 C), temperature source Oral, height _0  (1.702 m), weight 254 lb (115.2 kg), SpO2 98 %. Body mass index is 39.78 kg/m. Physical Exam  Constitutional: She is oriented to person, place, and time. She appears well-developed and well-nourished.  Cardiovascular: Normal rate.    Pulmonary/Chest: Effort normal.  Musculoskeletal: Normal range of motion.  Neurological: She is oriented to person, place, and time.  Skin: Skin is warm and dry.  Psychiatric: She has a normal mood and affect. Her behavior is normal.  Vitals reviewed.   RECENT LABS AND TESTS: BMET    Component Value Date/Time   NA 139 09/17/2017 1131   K 4.2 09/17/2017 1131   CL 102 09/17/2017 1131   CO2 21 09/17/2017 1131   GLUCOSE 97 09/17/2017 1131   GLUCOSE 92 01/15/2017 1417   BUN 14 09/17/2017 1131   CREATININE 1.07 (H) 09/17/2017 1131  CREATININE 0.86 09/28/2014 1557   CALCIUM 8.9 09/17/2017 1131   GFRNONAA 51 (L) 09/17/2017 1131   GFRNONAA 56 (L) 06/06/2014 1653   GFRAA 58 (L) 09/17/2017 1131   GFRAA 65 06/06/2014 1653   Lab Results  Component Value Date   HGBA1C 6.2 (H) 09/17/2017   HGBA1C 5.9 (H) 03/09/2014   Lab Results  Component Value Date   INSULIN 47.7 (H) 09/17/2017   CBC    Component Value Date/Time   WBC 8.5 09/17/2017 1131   WBC 9.1 02/24/2017 1555   RBC 5.22 09/17/2017 1131   RBC 5.09 02/24/2017 1555   HGB 14.9 09/17/2017 1131   HCT 45.1 09/17/2017 1131   PLT 258 08/27/2017 1509   MCV 86 09/17/2017 1131   MCH 28.5 09/17/2017 1131   MCH 29.0 09/28/2014 1557   MCHC 33.0 09/17/2017 1131   MCHC 33.9 02/24/2017 1555   RDW 13.5 09/17/2017 1131   LYMPHSABS 2.0 09/17/2017 1131   MONOABS 1.3 (H) 02/24/2017 1555   EOSABS 0.2 09/17/2017 1131   BASOSABS 0.0 09/17/2017 1131   Iron/TIBC/Ferritin/ %Sat No results found for: IRON, TIBC, FERRITIN, IRONPCTSAT Lipid Panel     Component Value Date/Time   CHOL 164 09/17/2017 1131   TRIG 117 09/17/2017 1131   HDL 47 09/17/2017 1131   CHOLHDL 3 01/15/2017 1417   VLDL 33.6 01/15/2017 1417   LDLCALC 94 09/17/2017 1131   LDLDIRECT 81.0 07/15/2016 1419   Hepatic Function Panel     Component Value Date/Time   PROT 6.8 09/17/2017 1131   ALBUMIN 3.8 09/17/2017 1131   AST 30 09/17/2017 1131   ALT 16 09/17/2017 1131    ALKPHOS 81 09/17/2017 1131   BILITOT 0.4 09/17/2017 1131   BILIDIR 0.1 01/15/2017 1417      Component Value Date/Time   TSH 2.040 09/17/2017 1131   TSH 2.70 07/15/2016 1419   TSH 1.529 06/06/2014 1608    ASSESSMENT AND PLAN: Gastroesophageal reflux disease without esophagitis  Class 2 severe obesity with serious comorbidity and body mass index (BMI) of 39.0 to 39.9 in adult, unspecified obesity type ([place])  PLAN:  Gastroesophageal Reflux Disease [name] [name] was educated on foods to decrease acid reflux and continue her Protonix on a regular basis. She was advised that if burning worsens ore spreads to jaw or arms, to take an ambulance to the hospital. [name] [name] agreed with this plan. She will follow up with our clinic in 2 to 3 weeks.  We spent > than 50% of the 30 minute visit on the counseling as documented in the note.  Obesity [name] [name] is currently in the action stage of change. As such, her goal is to continue with weight loss efforts She has agreed to get back to strict Category 2 plan + breakfast options [name] [name] has been instructed to work up to a goal of 150 minutes of combined cardio and strengthening exercise per week for weight loss and overall health benefits. We discussed the following Behavioral Modification Strategies today: increase H2O intake, increasing lean protein intake and work on meal planning and easy cooking plans  [name] [name] has agreed to follow up with our clinic in 2 to 3 weeks. She was informed of the importance of frequent follow up visits to maximize her success with intensive lifestyle modifications for her multiple health conditions.   OBESITY BEHAVIORAL INTERVENTION VISIT  Today's visit was # 6 out of 22.  Starting weight: 267 lbs Starting date: 09/17/17 Today's weight : 257 lbs Today's date: 12/11/2017 Total lbs  lost to date: 40 (Patients must lose 7 lbs in the first 6 months to continue with counseling)   ASK: We discussed the diagnosis of obesity  with [name] [name] today and [name] [name] agreed to give [place] permission to discuss obesity behavioral modification therapy today.  ASSESS: [name] [name] has the diagnosis of obesity and her BMI today is 40.29 [name] [name] is in the action stage of change   ADVISE: [name] [name] was educated on the multiple health risks of obesity as well as the benefit of weight loss to improve her health. She was advised of the need for long term treatment and the importance of lifestyle modifications.  AGREE: Multiple dietary modification options and treatment options were discussed and  [name] [name] agreed to the above obesity treatment plan.  I, Doreene Nest, am acting as transcriptionist for Dennard Nip, MD  I have reviewed the above documentation for accuracy and completeness, and I agree with the above. -Dennard Nip, MD

## 2017-12-11 NOTE — Progress Notes (Signed)
Daily Session Note  Patient Details  Name: [name] [name] MRN: [id number] Date of Birth: 1941-01-17 Referring Provider:     Pulmonary Rehab Walk Test from 06/17/2017 in [place]  Referring Provider  Dr. Annamaria Boots      Encounter Date: 12/11/2017  Check In: Session Check In - 12/11/17 1330      Check-In   Location  [facility]    Staff Present  Rosebud Poles, RN, BSN;Molly diVincenzo, MS, ACSM RCEP, Exercise Physiologist;Portia Rollene Rotunda, RN, Roque Cash, RN    Supervising physician immediately available to respond to emergencies  [facility] immediately available    Physician(s)  Dr. Cruzita Lederer    Medication changes reported      No    Fall or balance concerns reported     No    Tobacco Cessation  No Change    Warm-up and Cool-down  Performed as group-led instruction    Resistance Training Performed  Yes    VAD Patient?  No      Pain Assessment   Currently in Pain?  No/denies    Multiple Pain Sites  No       Capillary Blood Glucose: No results found for this or any previous visit (from the past 24 hour(s)).  Exercise Prescription Changes - 12/11/17 1500      Response to Exercise   Blood Pressure (Admit)  90/50    Blood Pressure (Exercise)  134/80    Blood Pressure (Exit)  106/60    Heart Rate (Admit)  67 bpm    Heart Rate (Exercise)  107 bpm    Heart Rate (Exit)  77 bpm    Oxygen Saturation (Admit)  96 %    Oxygen Saturation (Exercise)  93 %    Oxygen Saturation (Exit)  97 %    Rating of Perceived Exertion (Exercise)  13    Perceived Dyspnea (Exercise)  3    Duration  Progress to 45 minutes of aerobic exercise without signs/symptoms of physical distress    Intensity  THRR unchanged      Progression   Progression  Continue to progress workloads to maintain intensity without signs/symptoms of physical distress.      Resistance Training   Training Prescription  Yes    Weight  orange bands    Reps  10-15    Time  10  Minutes      Interval Training   Interval Training  No      NuStep   Level  7    Minutes  17    METs  3.3      Track   Laps  7    Minutes  17       Social History   Tobacco Use  Smoking Status Former Smoker  . Packs/day: 2.00  . Years: 18.00  . Pack years: 36.00  . Types: Cigarettes  Smokeless Tobacco Never Used  Tobacco Comment   "quit smoking in 1971"    Goals Met:  Exercise tolerated well Strength training completed today  Goals Unmet:  Not Applicable  Comments: Service time is from 1330 to 1520    Dr. Rush Farmer is Medical Director for Pulmonary Rehab at [facility].

## 2017-12-16 ENCOUNTER — Ambulatory Visit (HOSPITAL_COMMUNITY): Payer: Medicare Other

## 2017-12-16 ENCOUNTER — Telehealth (HOSPITAL_COMMUNITY): Payer: Self-pay | Admitting: Family Medicine

## 2017-12-16 ENCOUNTER — Encounter (HOSPITAL_COMMUNITY): Payer: Medicare Other

## 2017-12-18 ENCOUNTER — Encounter (HOSPITAL_COMMUNITY)
Admission: RE | Admit: 2017-12-18 | Discharge: 2017-12-18 | Disposition: A | Discharge status: Home / Self Care | Payer: Medicare Other | Source: Ambulatory Visit | Attending: Internal Medicine | Admitting: Internal Medicine

## 2017-12-18 ENCOUNTER — Ambulatory Visit (HOSPITAL_COMMUNITY): Payer: Medicare Other

## 2017-12-18 DIAGNOSIS — M1990 Unspecified osteoarthritis, unspecified site: Secondary | ICD-10-CM | POA: Diagnosis not present

## 2017-12-18 DIAGNOSIS — Z79899 Other long term (current) drug therapy: Secondary | ICD-10-CM | POA: Diagnosis not present

## 2017-12-18 DIAGNOSIS — Z7951 Long term (current) use of inhaled steroids: Secondary | ICD-10-CM | POA: Diagnosis not present

## 2017-12-18 DIAGNOSIS — R0609 Other forms of dyspnea: Secondary | ICD-10-CM | POA: Diagnosis not present

## 2017-12-18 DIAGNOSIS — Z950 Presence of cardiac pacemaker: Secondary | ICD-10-CM | POA: Diagnosis not present

## 2017-12-18 DIAGNOSIS — Z7901 Long term (current) use of anticoagulants: Secondary | ICD-10-CM | POA: Diagnosis not present

## 2017-12-18 NOTE — Progress Notes (Signed)
Daily Session Note  Patient Details  Name: [name] [name] MRN: [id number] Date of Birth: Aug 06, 1941 Referring Provider:     Pulmonary Rehab Walk Test from 06/17/2017 in [place]  Referring Provider  Dr. Annamaria Boots      Encounter Date: 12/18/2017  Check In: Session Check In - 12/18/17 1330      Check-In   Location  [facility]    Staff Present  Rosebud Poles, RN, BSN;Molly diVincenzo, MS, ACSM RCEP, Exercise Physiologist;Portia Rollene Rotunda, RN, Roque Cash, RN    Supervising physician immediately available to respond to emergencies  [facility] immediately available    Physician(s)  Dr. Eliseo Squires    Medication changes reported      No    Fall or balance concerns reported     No    Tobacco Cessation  No Change    Warm-up and Cool-down  Performed as group-led instruction    Resistance Training Performed  Yes    VAD Patient?  No      Pain Assessment   Currently in Pain?  No/denies    Multiple Pain Sites  No       Capillary Blood Glucose: No results found for this or any previous visit (from the past 24 hour(s)).  Exercise Prescription Changes - 12/18/17 1500      Response to Exercise   Blood Pressure (Admit)  100/60    Blood Pressure (Exercise)  126/74    Blood Pressure (Exit)  114/62    Heart Rate (Admit)  74 bpm    Heart Rate (Exercise)  107 bpm    Heart Rate (Exit)  62 bpm    Oxygen Saturation (Admit)  95 %    Oxygen Saturation (Exercise)  94 %    Oxygen Saturation (Exit)  94 %    Rating of Perceived Exertion (Exercise)  13    Perceived Dyspnea (Exercise)  3    Duration  Progress to 45 minutes of aerobic exercise without signs/symptoms of physical distress    Intensity  THRR unchanged      Progression   Progression  Continue to progress workloads to maintain intensity without signs/symptoms of physical distress.      Resistance Training   Training Prescription  Yes    Weight  orange bands    Reps  10-15    Time  10  Minutes      Interval Training   Interval Training  No      NuStep   Level  6    Minutes  17    METs  2.9      Track   Laps  14    Minutes  17       Social History   Tobacco Use  Smoking Status Former Smoker  . Packs/day: 2.00  . Years: 18.00  . Pack years: 36.00  . Types: Cigarettes  Smokeless Tobacco Never Used  Tobacco Comment   "quit smoking in 1971"    Goals Met:  Exercise tolerated well Strength training completed today  Goals Unmet:  Not Applicable  Comments: Service time is from 1330 to [place]    Dr. Rush Farmer is Medical Director for Pulmonary Rehab at [facility].

## 2017-12-23 ENCOUNTER — Ambulatory Visit (HOSPITAL_COMMUNITY): Payer: Medicare Other

## 2017-12-23 ENCOUNTER — Encounter (HOSPITAL_COMMUNITY)
Admission: RE | Admit: 2017-12-23 | Discharge: 2017-12-23 | Disposition: A | Discharge status: Home / Self Care | Payer: Medicare Other | Source: Ambulatory Visit

## 2017-12-23 DIAGNOSIS — R0609 Other forms of dyspnea: Principal | ICD-10-CM

## 2017-12-23 NOTE — Progress Notes (Signed)
Pulmonary Individual Treatment Plan  Patient Details  Name: TAGEN BRETHAUER MRN: [id number] Date of Birth: 02/03/1941 Referring Provider:     Pulmonary Rehab Walk Test from 06/17/2017 in [place]  Referring Provider  Dr. Annamaria Boots      Initial Encounter Date:    Pulmonary Rehab Walk Test from 06/17/2017 in [place]  Date  06/19/17  Referring Provider  Dr. Annamaria Boots      Visit Diagnosis: Dyspnea on exertion  Patient's Home Medications on Admission:   Current Outpatient Medications:  .  apixaban (ELIQUIS) 5 MG TABS tablet, Take 1 tablet (5 mg total) by mouth 2 (two) times daily., Disp: 28 tablet, Rfl: 0 .  Biotin 5000 MCG CAPS, Take 5,000 mcg by mouth daily. , Disp: , Rfl:  .  Calcium Carbonate-Vit D-Min (CALCIUM 1200 PO), Take 2,400 mg by mouth daily. , Disp: , Rfl:  .  cholecalciferol (VITAMIN D) 1000 UNITS tablet, Take 1,000 Units by mouth daily. , Disp: , Rfl:  .  Co-Enzyme Q10 100 MG CAPS, Take 100 mg by mouth daily. , Disp: , Rfl:  .  diazepam (VALIUM) 5 MG tablet, TAKE 1 TABLET BY MOUTH ONCE DAILY AS NEEDED FOR ANXIETY, Disp: 30 tablet, Rfl: 1 .  folic acid (FOLVITE) 403 MCG tablet, Take 400 mcg by mouth daily., Disp: , Rfl:  .  glucosamine-chondroitin 500-400 MG tablet, Take 1 tablet by mouth daily. , Disp: , Rfl:  .  loratadine (KLS ALLERCLEAR) 10 MG tablet, Take 10 mg by mouth daily., Disp: , Rfl:  .  MAGNESIUM CITRATE PO, Take 250 mg by mouth daily. 1 tab daily, Disp: , Rfl:  .  metoprolol tartrate (LOPRESSOR) 25 MG tablet, Take 0.5 tablets (12.5 mg total) by mouth 2 (two) times daily., Disp: 90 tablet, Rfl: 3 .  nitroGLYCERIN (NITROSTAT) 0.4 MG SL tablet, Place 0.4 mg under the tongue every 5 (five) minutes as needed for chest pain., Disp: , Rfl:  .  pantoprazole (PROTONIX) 40 MG tablet, TAKE 1 TABLET BY MOUTH DAILY, Disp: 90 tablet, Rfl: 1 .  Resveratrol 250 MG CAPS, Take 250 mg by mouth daily., Disp: , Rfl:  .   sertraline (ZOLOFT) 100 MG tablet, Take 1 tablet (100 mg total) by mouth daily., Disp: 90 tablet, Rfl: 0 .  Simethicone (GAS-X PO), Take 2 tablets by mouth daily as needed (bloating)., Disp: , Rfl:   Past Medical History: Past Medical History:  Diagnosis Date  . Alpha-1-antitrypsin deficiency carrier ([place])   . Arthritis    "all over"  . Atrial fibrillation ([place])   . Back pain   . Complication of anesthesia    "I woke up during 2 different procedures" (10/04/2014)  . Frequent UTI   . GERD (gastroesophageal reflux disease)   . Hypertension   . MVP (mitral valve prolapse)   . Obesity   . Osteoarthritis   . Presence of permanent cardiac pacemaker   . PVC's (premature ventricular contractions)   . Shortness of breath   . 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 Sleep apnea   . Stroke [facility]) 01/2014   denies deficits on 10/04/2014  . Tachy-brady syndrome ([place]) 10/2014  . TIA (transient ischemic attack)     Tobacco Use: Social History   Tobacco Use  Smoking Status Former Smoker  . Packs/day: 2.00  . Years: 18.00  . Pack years: 36.00  . Types: Cigarettes  Smokeless  Tobacco Never Used  Tobacco Comment   "quit smoking in 1971"    Labs: Recent Review Flowsheet Data    Labs for ITP Cardiac and Pulmonary Rehab Latest Ref Rng & Units 03/09/2014 10/25/2014 07/15/2016 01/15/2017 09/17/2017   Cholestrol 100 - 199 mg/dL 152 184 163 182 164   LDLCALC 0 - 99 mg/dL 77 138 - 94 94   LDLDIRECT mg/dL - - 81.0 - -   HDL >39 mg/dL 58 46 45.70 54.60 47   Trlycerides 0 - 149 mg/dL 87 210(A) 208.0(H) 168.0(H) 117   Hemoglobin A1c 4.8 - 5.6 % 5.9(H) - - - 6.2(H)   TCO2 0 - 100 mmol/L - - - - -      Capillary Blood Glucose: Lab Results  Component Value Date   GLUCAP 103 (H) 03/09/2014   GLUCAP 103 (H) 03/09/2014   GLUCAP 112 (H) 03/09/2014   POCT Glucose    Row Name 07/15/17 1555             POCT Blood Glucose   Pre-Exercise  220 mg/dL        Post-Exercise  165 mg/dL          Pulmonary Assessment Scores:   Pulmonary Function Assessment:   Exercise Target Goals:    Exercise Program Goal: Individual exercise prescription set using results from initial 6 min walk test and THRR while considering  patient's activity barriers and safety.    Exercise Prescription Goal: Initial exercise prescription builds to 30-45 minutes a day of aerobic activity, 2-3 days per week.  Home exercise guidelines will be given to patient during program as part of exercise prescription that the participant will acknowledge.  Activity Barriers & Risk Stratification:   6 Minute Walk:   Oxygen Initial Assessment:   Oxygen Re-Evaluation: Oxygen Re-Evaluation    Row Name 07/01/17 1924 07/30/17 2103 08/22/17 1357 09/22/17 0834 10/14/17 1543     Program Oxygen Prescription   Program Oxygen Prescription  None  None  None  None  None     Home Oxygen   Home Oxygen Device  None  None  None  None  None   Sleep Oxygen Prescription  CPAP  CPAP  CPAP  CPAP  CPAP   Liters per minute  0  -  -  0  -   Home Exercise Oxygen Prescription  None  None  None  None  None   Home at Rest Exercise Oxygen Prescription  None  None  None  None  None   Compliance with Home Oxygen Use  Yes  -  -  Yes  Yes   Row Name 11/04/17 1549 11/21/17 0845 12/22/17 1010         Program Oxygen Prescription   Program Oxygen Prescription  None  None  None       Home Oxygen   Home Oxygen Device  None  None  None     Sleep Oxygen Prescription  CPAP  CPAP  CPAP     Liters per minute  -  0  -     Home Exercise Oxygen Prescription  None  None  None     Home at Rest Exercise Oxygen Prescription  None  None  None     Compliance with Home Oxygen Use  Yes  Yes  Yes        Oxygen Discharge (Final Oxygen Re-Evaluation): Oxygen Re-Evaluation - 12/22/17 1010      Program Oxygen Prescription   Program Oxygen  Prescription  None      Home Oxygen   Home Oxygen Device  None     Sleep Oxygen Prescription  CPAP    Home Exercise Oxygen Prescription  None    Home at Rest Exercise Oxygen Prescription  None    Compliance with Home Oxygen Use  Yes       Initial Exercise Prescription:   Perform Capillary Blood Glucose checks as needed.  Exercise Prescription Changes: Exercise Prescription Changes    Row Name 06/26/17 1500 07/01/17 1500 07/03/17 1500 07/08/17 1500 07/10/17 1500     Response to Exercise   Blood Pressure (Admit)  120/60  100/56  96/60  110/64  96/64   Blood Pressure (Exercise)  130/84  122/82  130/86  118/70  112/86   Blood Pressure (Exit)  108/74  104/60  126/60  108/78  100/60   Heart Rate (Admit)  78 bpm  97 bpm  79 bpm  72 bpm  75 bpm   Heart Rate (Exercise)  94 bpm  107 bpm  105 bpm  94 bpm  88 bpm   Heart Rate (Exit)  70 bpm  79 bpm  71 bpm  76 bpm  74 bpm   Oxygen Saturation (Admit)  94 %  96 %  96 %  94 %  96 %   Oxygen Saturation (Exercise)  93 %  93 %  87 % Sat increased to 92% with rest  93 % Sat increased to 92% with rest  95 %   Oxygen Saturation (Exit)  95 %  94 %  93 %  96 %  96 %   Rating of Perceived Exertion (Exercise)  _0 Perceived Dyspnea (Exercise)  _1 Duration  Progress to 45 minutes of aerobic exercise without signs/symptoms of physical distress  Progress to 45 minutes of aerobic exercise without signs/symptoms of physical distress  Progress to 45 minutes of aerobic exercise without signs/symptoms of physical distress  Progress to 45 minutes of aerobic exercise without signs/symptoms of physical distress  Progress to 45 minutes of aerobic exercise without signs/symptoms of physical distress   Intensity  THRR unchanged  THRR unchanged  THRR unchanged  THRR unchanged  THRR unchanged     Progression   Progression  Continue to progress workloads to maintain intensity without signs/symptoms of physical distress.  Continue to progress workloads to maintain intensity without signs/symptoms of physical  distress.  Continue to progress workloads to maintain intensity without signs/symptoms of physical distress.  Continue to progress workloads to maintain intensity without signs/symptoms of physical distress.  Continue to progress workloads to maintain intensity without signs/symptoms of physical distress.     Resistance Training   Training Prescription  Yes  Yes  Yes  Yes  Yes   Weight  orange bands  orange bands  orange bands  orange bands  orange bands   Reps  10-15  10-15  10-15  10-15  10-15   Time  10 Minutes  10 Minutes  10 Minutes  10 Minutes  10 Minutes     NuStep   Level  2  2  -  2  3   Minutes  17  17  -  17  17   METs  1.1  1.9  -  1.8  2.4     Arm Ergometer   Level  3  3  3  3  3   Minutes  _0 Track   Laps  -  _1 -   Minutes  -  _2 -   Row Name 07/15/17 1500 07/17/17 1500 07/24/17 1605 07/31/17 1600 08/05/17 1500     Response to Exercise   Blood Pressure (Admit)  94/60  90/50  104/70  110/62  100/60   Blood Pressure (Exercise)  110/74  98/50  108/64  114/60  122/66   Blood Pressure (Exit)  100/64  100/60  104/60  98/0  110/60   Heart Rate (Admit)  70 bpm  80 bpm  74 bpm  80 bpm  76 bpm   Heart Rate (Exercise)  77 bpm  94 bpm  88 bpm  88 bpm  94 bpm   Heart Rate (Exit)  92 bpm  75 bpm  86 bpm  83 bpm  65 bpm   Oxygen Saturation (Admit)  93 %  96 %  93 %  94 %  96 %   Oxygen Saturation (Exercise)  94 %  90 %  94 %  95 %  94 %   Oxygen Saturation (Exit)  96 %  95 %  96 %  93 %  91 %   Rating of Perceived Exertion (Exercise)  _3 Perceived Dyspnea (Exercise)  _4 Duration  Progress to 45 minutes of aerobic exercise without signs/symptoms of physical distress  Progress to 45 minutes of aerobic exercise without signs/symptoms of physical distress  Progress to 45 minutes of aerobic exercise without signs/symptoms of physical distress  Progress to 45 minutes of aerobic exercise without signs/symptoms of physical  distress  Progress to 45 minutes of aerobic exercise without signs/symptoms of physical distress   Intensity  THRR unchanged  THRR unchanged  THRR unchanged  THRR unchanged  THRR unchanged     Progression   Progression  Continue to progress workloads to maintain intensity without signs/symptoms of physical distress.  Continue to progress workloads to maintain intensity without signs/symptoms of physical distress.  Continue to progress workloads to maintain intensity without signs/symptoms of physical distress.  Continue to progress workloads to maintain intensity without signs/symptoms of physical distress.  Continue to progress workloads to maintain intensity without signs/symptoms of physical distress.     Resistance Training   Training Prescription  Yes  Yes  Yes  Yes  Yes   Weight  orange bands  orange bands  orange bands  orange bands  orange bands   Reps  10-15  10-15  10-15  10-15  10-15   Time  10 Minutes  10 Minutes  10 Minutes  10 Minutes  10 Minutes     NuStep   Level  4  -  _5 Minutes  17  -  _6 METs  2  -  1.4  1.7  2.2     Arm Ergometer   Level  3  -  5  6  6.5   Minutes  17  -  _7 Row Name 08/12/17 1559 08/14/17 1500 08/14/17 1546 08/19/17 1555 08/21/17 1600     Response to Exercise   Blood Pressure (Admit)  100/62  -  98/62  98/62  92/68   Blood Pressure (Exercise)  104/62  -  98/56  97/67  112/62   Blood Pressure (Exit)  104/64  -  100/54  114/74  100/80   Heart Rate (Admit)  70 bpm  -  77 bpm  68 bpm  80 bpm   Heart Rate (Exercise)  99 bpm  -  86 bpm  100 bpm  98 bpm   Heart Rate (Exit)  70 bpm  -  82 bpm  90 bpm  86 bpm   Oxygen Saturation (Admit)  95 %  -  95 %  94 %  96 %   Oxygen Saturation (Exercise)  96 %  -  95 %  94 %  95 %   Oxygen Saturation (Exit)  95 %  -  96 %  94 %  96 %   Rating of Perceived Exertion (Exercise)  13  -  _0 Perceived Dyspnea (Exercise)  3  -  _1 Duration  Progress to 45 minutes of aerobic  exercise without signs/symptoms of physical distress  -  Progress to 45 minutes of aerobic exercise without signs/symptoms of physical distress  Progress to 45 minutes of aerobic exercise without signs/symptoms of physical distress  Progress to 45 minutes of aerobic exercise without signs/symptoms of physical distress   Intensity  THRR unchanged  -  THRR unchanged  THRR unchanged  THRR unchanged     Progression   Progression  Continue to progress workloads to maintain intensity without signs/symptoms of physical distress.  -  Continue to progress workloads to maintain intensity without signs/symptoms of physical distress.  Continue to progress workloads to maintain intensity without signs/symptoms of physical distress.  Continue to progress workloads to maintain intensity without signs/symptoms of physical distress.     Resistance Training   Training Prescription  Yes  -  Yes  Yes  Yes   Weight  orange bands  -  orange bands  orange bands  orange bands   Reps  10-15  -  10-15  10-15  10-15   Time  10 Minutes  -  10 Minutes  10 Minutes  10 Minutes     NuStep   Level  5  -  _2 Minutes  17  -  51  17  17   METs  1.9  -  2  2.1  1.2     Arm Ergometer   Level  6.5  -  -  6  6   Minutes  17  -  -  17  17     Track   Laps  -  -  -  8  -   Minutes  -  -  -  17  -     Home Exercise Plan   Plans to continue exercise at  [facility] (comment) water treading at [facility] and walking at home  -  -  -   Frequency  -  Add 2 additional days to program exercise sessions.  -  -  -   Row Name 08/26/17 1500 08/28/17 1500 09/09/17 1500 09/16/17 1529 09/23/17 1500     Response to Exercise   Blood Pressure (Admit)  104/64  100/60  96/64  144/64  126/84   Blood Pressure (Exercise)  110/60  130/74  110/66  120/74  104/60   Blood  Pressure (Exit)  118/62  98/64  104/60  126/60  100/70   Heart Rate (Admit)  83 bpm  71 bpm  64 bpm  66 bpm  71 bpm   Heart Rate (Exercise)  110 bpm  88 bpm  94 bpm  88  bpm  94 bpm   Heart Rate (Exit)  78 bpm  69 bpm  87 bpm  61 bpm  64 bpm   Oxygen Saturation (Admit)  95 %  96 %  96 %  95 %  95 %   Oxygen Saturation (Exercise)  93 %  95 %  93 %  94 %  93 %   Oxygen Saturation (Exit)  95 %  94 %  93 %  91 %  95 %   Rating of Perceived Exertion (Exercise)  _0 Perceived Dyspnea (Exercise)  _1 Duration  Progress to 45 minutes of aerobic exercise without signs/symptoms of physical distress  Progress to 45 minutes of aerobic exercise without signs/symptoms of physical distress  Progress to 45 minutes of aerobic exercise without signs/symptoms of physical distress  Progress to 45 minutes of aerobic exercise without signs/symptoms of physical distress  Progress to 45 minutes of aerobic exercise without signs/symptoms of physical distress   Intensity  THRR unchanged  THRR unchanged  THRR unchanged  THRR unchanged  THRR unchanged     Progression   Progression  Continue to progress workloads to maintain intensity without signs/symptoms of physical distress.  Continue to progress workloads to maintain intensity without signs/symptoms of physical distress.  Continue to progress workloads to maintain intensity without signs/symptoms of physical distress.  Continue to progress workloads to maintain intensity without signs/symptoms of physical distress.  Continue to progress workloads to maintain intensity without signs/symptoms of physical distress.     Resistance Training   Training Prescription  Yes  Yes  Yes  Yes  Yes   Weight  orange bands  orange bands  orange bands  orange bands  orange bands   Reps  10-15  10-15  10-15  10-15  10-15   Time  10 Minutes  10 Minutes  10 Minutes  10 Minutes  10 Minutes     NuStep   Level  _2 -   Minutes  _3 34  -   METs  2.3  2  2.2  2.2  -     Arm Ergometer   Level  6  6.5  -  -  6.5   Minutes  17  17  -  -  34     Track   Laps  9  -  _4 Minutes  17  -  _5 Row  Name 09/30/17 1500 10/02/17 1613 10/21/17 1500 11/04/17 1500 11/06/17 1500     Response to Exercise   Blood Pressure (Admit)  106/60  142/80  120/80  114/70  112/74   Blood Pressure (Exercise)  122/70  140/66  100/66  102/60  100/70   Blood Pressure (Exit)  102/66  124/80  90/46  138/80  94/64   Heart Rate (Admit)  68 bpm  87 bpm  73 bpm  84 bpm  71 bpm   Heart Rate (Exercise)  96 bpm  104 bpm  96  bpm  99 bpm  106 bpm   Heart Rate (Exit)  66 bpm  59 bpm  67 bpm  93 bpm  69 bpm   Oxygen Saturation (Admit)  96 %  92 %  94 %  96 %  95 %   Oxygen Saturation (Exercise)  94 %  95 %  94 %  94 %  95 %   Oxygen Saturation (Exit)  92 %  96 %  96 %  93 %  97 %   Rating of Perceived Exertion (Exercise)  _0 Perceived Dyspnea (Exercise)  _1 Duration  Progress to 45 minutes of aerobic exercise without signs/symptoms of physical distress  Progress to 45 minutes of aerobic exercise without signs/symptoms of physical distress  Progress to 45 minutes of aerobic exercise without signs/symptoms of physical distress  Progress to 45 minutes of aerobic exercise without signs/symptoms of physical distress  Progress to 45 minutes of aerobic exercise without signs/symptoms of physical distress   Intensity  THRR unchanged  THRR unchanged  THRR unchanged  THRR unchanged  THRR unchanged     Progression   Progression  Continue to progress workloads to maintain intensity without signs/symptoms of physical distress.  Continue to progress workloads to maintain intensity without signs/symptoms of physical distress.  Continue to progress workloads to maintain intensity without signs/symptoms of physical distress.  Continue to progress workloads to maintain intensity without signs/symptoms of physical distress.  Continue to progress workloads to maintain intensity without signs/symptoms of physical distress.     Resistance Training   Training Prescription  Yes  Yes  Yes  Yes  Yes   Weight  orange  bands  orange bands  orange bands  orange bands  orange bands   Reps  10-15  10-15  10-15  10-15  10-15   Time  10 Minutes  10 Minutes  10 Minutes  10 Minutes  10 Minutes     Interval Training   Interval Training  No  No  No  No  No     NuStep   Level  _2 Minutes  _3 METs  2.3  2.1  3.2  2.3  2.6     Arm Ergometer   Level  6.5  5 workload reduced related to level 6.5 causes shoulder pain  5 workload reduced related to level 6.5 causes shoulder pain  5  -   Minutes  _4 -     Track   Laps  11  -  _5 Minutes  17  -  _6 Row Name 11/13/17 1533 11/27/17 1500 12/04/17 1600 12/09/17 1614 12/11/17 1500     Response to Exercise   Blood Pressure (Admit)  112/54  100/60  104/56  116/66  90/50   Blood Pressure (Exercise)  110/66  100/60  158/70  100/60  134/80   Blood Pressure (Exit)  100/60  106/60  100/60  114/64  106/60   Heart Rate (Admit)  70 bpm  77 bpm  85 bpm  78 bpm  67 bpm   Heart Rate (Exercise)  112 bpm  105 bpm  112 bpm  107 bpm  107 bpm  Heart Rate (Exit)  75 bpm  63 bpm  81 bpm  71 bpm  77 bpm   Oxygen Saturation (Admit)  95 %  95 %  96 %  97 %  96 %   Oxygen Saturation (Exercise)  95 %  93 %  94 %  95 %  93 %   Oxygen Saturation (Exit)  93 %  95 %  96 %  94 %  97 %   Rating of Perceived Exertion (Exercise)  _0 Perceived Dyspnea (Exercise)  _1 Duration  Progress to 45 minutes of aerobic exercise without signs/symptoms of physical distress  Progress to 45 minutes of aerobic exercise without signs/symptoms of physical distress  Progress to 45 minutes of aerobic exercise without signs/symptoms of physical distress  Progress to 45 minutes of aerobic exercise without signs/symptoms of physical distress  Progress to 45 minutes of aerobic exercise without signs/symptoms of physical distress   Intensity  THRR unchanged  THRR unchanged  THRR unchanged  THRR unchanged  THRR unchanged      Progression   Progression  Continue to progress workloads to maintain intensity without signs/symptoms of physical distress.  Continue to progress workloads to maintain intensity without signs/symptoms of physical distress.  Continue to progress workloads to maintain intensity without signs/symptoms of physical distress.  Continue to progress workloads to maintain intensity without signs/symptoms of physical distress.  Continue to progress workloads to maintain intensity without signs/symptoms of physical distress.     Resistance Training   Training Prescription  Yes  Yes  Yes  Yes  Yes   Weight  orange bands  orange bands  orange bands  orange bands  orange bands   Reps  10-15  10-15  10-15  10-15  10-15   Time  10 Minutes  10 Minutes  10 Minutes  10 Minutes  10 Minutes     Interval Training   Interval Training  No  No  No  No  No     NuStep   Level  6  6  -  6  7   Minutes  17  17  -  17  17   METs  2.3  2.3  -  2.3  3.3     Arm Ergometer   Level  6  5.2  5.2  5.2  -   Minutes  _2 -     Track   Laps  _3 Minutes  _4 Row Name 12/18/17 1500             Response to Exercise   Blood Pressure (Admit)  100/60       Blood Pressure (Exercise)  126/74       Blood Pressure (Exit)  114/62       Heart Rate (Admit)  74 bpm       Heart Rate (Exercise)  107 bpm       Heart Rate (Exit)  62 bpm       Oxygen Saturation (Admit)  95 %       Oxygen Saturation (Exercise)  94 %       Oxygen Saturation (Exit)  94 %       Rating of Perceived Exertion (Exercise)  13       Perceived Dyspnea (Exercise)  3       Duration  Progress to 45 minutes of aerobic exercise without signs/symptoms of physical distress       Intensity  THRR unchanged         Progression   Progression  Continue to progress workloads to maintain intensity without signs/symptoms of physical distress.         Resistance Training   Training Prescription  Yes       Weight  orange  bands       Reps  10-15       Time  10 Minutes         Interval Training   Interval Training  No         NuStep   Level  6       Minutes  17       METs  2.9         Track   Laps  14       Minutes  17          Exercise Comments: Exercise Comments    Row Name 08/14/17 1530           Exercise Comments  home exercise completed          Exercise Goals and Review:   Exercise Goals Re-Evaluation : Exercise Goals Re-Evaluation    [place] Name 06/27/17 1042 07/29/17 1005 08/18/17 1402 09/20/17 1337 10/13/17 1638     Exercise Goal Re-Evaluation   Exercise Goals Review  Increase Physical Activity;Increase Strenth and Stamina  Increase Physical Activity;Increase Strength and Stamina;Able to understand and use Dyspnea scale;Able to understand and use rate of perceived exertion (RPE) scale;Knowledge and understanding of Target Heart Rate Range (THRR);Understanding of Exercise Prescription  Increase Strength and Stamina;Increase Physical Activity;Able to understand and use Dyspnea scale;Able to understand and use rate of perceived exertion (RPE) scale;Knowledge and understanding of Target Heart Rate Range (THRR);Understanding of Exercise Prescription  Increase Physical Activity;Increase Strength and Stamina;Able to understand and use Dyspnea scale;Able to understand and use rate of perceived exertion (RPE) scale;Knowledge and understanding of Target Heart Rate Range (THRR);Understanding of Exercise Prescription  Increase Strength and Stamina;Increase Physical Activity;Able to understand and use rate of perceived exertion (RPE) scale;Knowledge and understanding of Target Heart Rate Range (THRR);Understanding of Exercise Prescription;Able to understand and use Dyspnea scale   Comments  Patient has only attended two exercise sessions. Will cont. to monitor and progress as able.   Patient is progressing slowly in program. She is limited by orthopedic issues. Will cont. to progress as able.  Patient is  doing well in program. She is showing new initiative to push herself. Home exercise completed. Hesitant to exercise at home. Will cont. to monitor and motivate patient.   Patient has been progressing well. Mets still range in "low" level. Works hard while she is here. Patient has missed a few sessions recently because her daughter is in the hospital.  Patient is motivated to work hard and make improvements. Daughter has just past away. Wants to cont. to come to rehab. We have extended her to 36 sessions. Will cont. to monitor and progress as able.    Expected Outcomes  Through exercising at rehab and at home, patient will increase physical strength and stamina and find ADL's easier to perform.   Through exercising at rehab and at home, patient will increase physical strength and stamina and find ADL's easier to perform.  Through exercising at rehab and at home, patient will increase physical strength and stamina and find ADL's easier to perform.   Through exercising at rehab and at home, patient will increase physical strength and stamina and find ADL's easier to perform.   Through exercise at rehab and at home, patient will increase strength and stamina and will find that ADL's are easier to preform.    [place] Name 11/03/17 1522 11/20/17 0955 12/19/17 1516         Exercise Goal Re-Evaluation   Exercise Goals Review  Increase Physical Activity;Increase Strength and Stamina;Able to understand and use Dyspnea scale;Able to understand and use rate of perceived exertion (RPE) scale;Knowledge and understanding of Target Heart Rate Range (THRR);Understanding of Exercise Prescription  Increase Strength and Stamina;Increase Physical Activity;Able to understand and use rate of perceived exertion (RPE) scale;Knowledge and understanding of Target Heart Rate Range (THRR);Understanding of Exercise Prescription;Able to understand and use Dyspnea scale  Increase Strength and Stamina;Increase Physical Activity;Able to  understand and use Dyspnea scale;Able to understand and use rate of perceived exertion (RPE) scale;Knowledge and understanding of Target Heart Rate Range (THRR);Understanding of Exercise Prescription     Comments  Patient is motivated to work hard and make improvements. Daughter has just past away. Patient had a bad fall that broke her nose. Wants to cont. to come to rehab. We have extended her to 36 sessions. Will cont. to monitor and progress as able.   Patient is motivated to work hard and make improvements. Daughter has just past away. Patient had a bad fall that broke her nose. Wants to cont. to come to rehab. We have extended her to 36 sessions. Will cont. to monitor and progress as able.   Patient has put in noticably more effort than in the past. She is losing weight and has a great attitude. Patient is walking up to 14 laps (200 feet each) in 15 minutes. Will cont. to progress and motivate.     Expected Outcomes  Through exercise at rehab and at home, patient will increase strength and stamina and find that ADL's are easier to preform.    Through exercise at rehab and at home, patient will increase strength and stamina and be able to perform ADL's easier.   Through exercise at rehab and at home, patient will increase endurance and strength. Patient will also be able to perform ADL's with less shortness of breath and fatigue.        Discharge Exercise Prescription (Final Exercise Prescription Changes): Exercise Prescription Changes - 12/18/17 1500      Response to Exercise   Blood Pressure (Admit)  100/60    Blood Pressure (Exercise)  126/74    Blood Pressure (Exit)  114/62    Heart Rate (Admit)  74 bpm    Heart Rate (Exercise)  107 bpm    Heart Rate (Exit)  62 bpm    Oxygen Saturation (Admit)  95 %    Oxygen Saturation (Exercise)  94 %    Oxygen Saturation (Exit)  94 %    Rating of Perceived Exertion (Exercise)  13    Perceived Dyspnea (Exercise)  3    Duration  Progress to 45 minutes  of aerobic exercise without signs/symptoms of physical distress    Intensity  THRR unchanged      Progression   Progression  Continue to progress workloads to maintain intensity without signs/symptoms of physical distress.      Resistance Training   Training Prescription  Yes  Weight  orange bands    Reps  10-15    Time  10 Minutes      Interval Training   Interval Training  No      NuStep   Level  6    Minutes  17    METs  2.9      Track   Laps  14    Minutes  17       Nutrition:  Target Goals: Understanding of nutrition guidelines, daily intake of sodium <1573m, cholesterol <2028m calories 30% from fat and 7% or less from saturated fats, daily to have 5 or more servings of fruits and vegetables.  Biometrics:    Nutrition Therapy Plan and Nutrition Goals:   Nutrition Assessments:   Nutrition Goals Re-Evaluation:   Nutrition Goals Discharge (Final Nutrition Goals Re-Evaluation):   Psychosocial: Target Goals: Acknowledge presence or absence of significant depression and/or stress, maximize coping skills, provide positive support system. Participant is able to verbalize types and ability to use techniques and skills needed for reducing stress and depression.  Initial Review & Psychosocial Screening:   Quality of Life Scores:  Scores of 19 and below usually indicate a poorer quality of life in these areas.  A difference of  2-3 points is a clinically meaningful difference.  A difference of 2-3 points in the total score of the Quality of Life Index has been associated with significant improvement in overall quality of life, self-image, physical symptoms, and general health in studies assessing change in quality of life.   PHQ-9: Recent Review Flowsheet Data    Dep[facility]9 09/17/2017 06/09/2017 01/15/2017 07/15/2016   Decreased Interest 2 3 0 0   Down, Depressed, Hopeless 2 - 0  0   PHQ - 2 Score 4 3 0 0   Altered sleeping 0 0 - -   Tired,  decreased energy 3 3 - -   Change in appetite 3 3 - -   Feeling bad or failure about yourself  2 3 - -   Trouble concentrating 3 0 - -   Moving slowly or fidgety/restless 0 0 - -   Suicidal thoughts 0 0 - -   PHQ-9 Score 15 12 - -   Difficult doing work/chores Very difficult Not difficult at all - -     Interpretation of Total Score  Total Score Depression Severity:  1-4 = Minimal depression, 5-9 = Mild depression, 10-14 = Moderate depression, 15-19 = Moderately severe depression, 20-27 = Severe depression   Psychosocial Evaluation and Intervention:   Psychosocial Re-Evaluation: Psychosocial Re-Evaluation    Row Name 07/01/17 1926 07/30/17 2105 08/22/17 1357 09/22/17 0836 10/14/17 1545     Psychosocial Re-Evaluation   Current issues with  None Identified  None Identified  Current Stress Concerns  Current Stress Concerns  Current Stress Concerns her daughter died after a long illness   Comments  -  -  patient worries about dauther who has terminal pulmonary disease and requires high flow oxygen. She worries that she is unable to help her daughter because of her own shortness of breath and deconditioning.   patient worries about dauther who has terminal pulmonary disease and requires high flow oxygen. She worries that she is unable to help her daughter because of her own shortness of breath and deconditioning.  her daughter has been hospitalized over the last week since she ran out of oxygen from the huricaine and the patient has not been at her rehab sessions.  She has not returned to the program since her daughter died, unable to evaluate psychosocial issue(s)   Expected Outcomes  -  patient will remain free from psychosocial barriers to participation  patient will remain free from psychosocial barriers to participation  patient will remain free from psychosocial barriers to participation  patient will remain free from psychosocial barriers to participation   Interventions  -  Encouraged to  attend Pulmonary Rehabilitation for the exercise  Encouraged to attend Pulmonary Rehabilitation for the exercise  Encouraged to attend Pulmonary Rehabilitation for the exercise  Encouraged to attend Pulmonary Rehabilitation for the exercise   Continue Psychosocial Services   -  No Follow up required  No Follow up required  Follow up required by staff  Follow up required by staff   Comments  -  -  daughters terminal pulmonary illness  daughters terminal pulmonary illness  now she is dealing with her daughter's death     Initial Review   Source of Stress Concerns  -  -  Family  Family  Family   Row Name 11/04/17 1558 11/21/17 0847 12/22/17 1025         Psychosocial Re-Evaluation   Current issues with  Current Stress Concerns  Current Stress Concerns  Current Stress Concerns     Comments  going through the grief process, has good and bad days, but overall is doing well  going through the grief process, has good and bad days, but overall is doing well  continues to greive for her daughter appropriately     Expected Outcomes  patient will remain free from psychosocial barriers to participation  patient will remain free from psychosocial barriers to participation  patient will remain free from psychosocial barriers to participation     Interventions  Encouraged to attend Pulmonary Rehabilitation for the exercise  Encouraged to attend Pulmonary Rehabilitation for the exercise  Encouraged to attend Pulmonary Rehabilitation for the exercise     Continue Psychosocial Services   Follow up required by staff  Follow up required by staff  Follow up required by staff     Comments  -  still dealing with her daughter's death  weight loss is from going to a weight loss clinic not due to depression       Initial Review   Source of Stress Concerns  Family  Family  Family        Psychosocial Discharge (Final Psychosocial Re-Evaluation): Psychosocial Re-Evaluation - 12/22/17 1025      Psychosocial Re-Evaluation     Current issues with  Current Stress Concerns    Comments  continues to greive for her daughter appropriately    Expected Outcomes  patient will remain free from psychosocial barriers to participation    Interventions  Encouraged to attend Pulmonary Rehabilitation for the exercise    Continue Psychosocial Services   Follow up required by staff    Comments  weight loss is from going to a weight loss clinic not due to depression      Initial Review   Source of Stress Concerns  Family       Education: Education Goals: Education classes will be provided on a weekly basis, covering required topics. Participant will state understanding/return demonstration of topics presented.  Learning Barriers/Preferences:   Education Topics: Risk Factor Reduction:  -Group instruction that is supported by a PowerPoint presentation. Instructor discusses the definition of a risk factor, different risk factors for pulmonary disease, and how the heart and lungs work together.  PULMONARY REHAB OTHER RESPIRATORY from 12/18/2017 in [place]  Date  06/26/17  Educator  ep  Instruction Review Code  2- meets goals/outcomes      Nutrition for Pulmonary Patient:  -Group instruction provided by PowerPoint slides, verbal discussion, and written materials to support subject matter. The instructor gives an explanation and review of healthy diet recommendations, which includes a discussion on weight management, recommendations for fruit and vegetable consumption, as well as protein, fluid, caffeine, fiber, sodium, sugar, and alcohol. Tips for eating when patients are short of breath are discussed.   PULMONARY REHAB OTHER RESPIRATORY from 12/18/2017 in [place]  Date  12/04/17  Educator  edna  Instruction Review Code  R- Review/reinforce      Pursed Lip Breathing:  -Group instruction that is supported by demonstration and informational handouts.  Instructor discusses the benefits of pursed lip and diaphragmatic breathing and detailed demonstration on how to preform both.     Oxygen Safety:  -Group instruction provided by PowerPoint, verbal discussion, and written material to support subject matter. There is an overview of "What is Oxygen" and "Why do we need it".  Instructor also reviews how to create a safe environment for oxygen use, the importance of using oxygen as prescribed, and the risks of noncompliance. There is a brief discussion on traveling with oxygen and resources the patient may utilize.   PULMONARY REHAB OTHER RESPIRATORY from 12/18/2017 in [place]  Date  09/25/17  Educator  Truddie Crumble  Instruction Review Code  2- meets goals/outcomes      Oxygen Equipment:  -Group instruction provided by [employer] Staff utilizing handouts, written materials, and equipment demonstrations.   Signs and Symptoms:  -Group instruction provided by written material and verbal discussion to support subject matter. Warning signs and symptoms of infection, stroke, and heart attack are reviewed and when to call the physician/911 reinforced. Tips for preventing the spread of infection discussed.   PULMONARY REHAB OTHER RESPIRATORY from 12/18/2017 in [place]  Date  08/21/17  Educator  rn  Instruction Review Code  2- meets goals/outcomes      Advanced Directives:  -Group instruction provided by verbal instruction and written material to support subject matter. Instructor reviews Advanced Directive laws and proper instruction for filling out document.   Pulmonary Video:  -Group video education that reviews the importance of medication and oxygen compliance, exercise, good nutrition, pulmonary hygiene, and pursed lip and diaphragmatic breathing for the pulmonary patient.   PULMONARY REHAB OTHER RESPIRATORY from 12/18/2017 in [place]  Date   08/28/17  Instruction Review Code  2- meets goals/outcomes      Exercise for the Pulmonary Patient:  -Group instruction that is supported by a PowerPoint presentation. Instructor discusses benefits of exercise, core components of exercise, frequency, duration, and intensity of an exercise routine, importance of utilizing pulse oximetry during exercise, safety while exercising, and options of places to exercise outside of rehab.     PULMONARY REHAB OTHER RESPIRATORY from 12/18/2017 in [place]  Date  08/12/17  Educator  ep  Instruction Review Code  2- meets goals/outcomes      Pulmonary Medications:  -Verbally interactive group education provided by instructor with focus on inhaled medications and proper administration.   PULMONARY REHAB OTHER RESPIRATORY from 12/18/2017 in [place]  Date  12/18/17  Educator  pharmacist  Instruction Review Code  R- Review/reinforce      Anatomy and Physiology of the Respiratory System and Intimacy:  -Group instruction provided by PowerPoint, verbal discussion, and written material to support subject matter. Instructor reviews respiratory cycle and anatomical components of the respiratory system and their functions. Instructor also reviews differences in obstructive and restrictive respiratory diseases with examples of each. Intimacy, Sex, and Sexuality differences are reviewed with a discussion on how relationships can change when diagnosed with pulmonary disease. Common sexual concerns are reviewed.   MD DAY -A group question and answer session with a medical doctor that allows participants to ask questions that relate to their pulmonary disease state.   PULMONARY REHAB OTHER RESPIRATORY from 12/18/2017 in [place]  Date  12/11/17  Educator  yacoub  Instruction Review Code  R- Review/reinforce      OTHER EDUCATION -Group or individual verbal,  written, or video instructions that support the educational goals of the pulmonary rehab program.   Knowledge Questionnaire Score:   Core Components/Risk Factors/Patient Goals at Admission:   Core Components/Risk Factors/Patient Goals Review:  Goals and Risk Factor Review    Row Name 07/01/17 1924 07/30/17 2104 08/22/17 1357 09/22/17 0834 10/14/17 1543     Core Components/Risk Factors/Patient Goals Review   Personal Goals Review  -  Weight Management/Obesity;Improve shortness of breath with ADL's;Develop more efficient breathing techniques such as purse lipped breathing and diaphragmatic breathing and practicing self-pacing with activity.  Weight Management/Obesity;Improve shortness of breath with ADL's;Develop more efficient breathing techniques such as purse lipped breathing and diaphragmatic breathing and practicing self-pacing with activity.  -  Weight Management/Obesity;Improve shortness of breath with ADL's;Develop more efficient breathing techniques such as purse lipped breathing and diaphragmatic breathing and practicing self-pacing with activity.   Review  patient has only attended 3 exercise sessions since admission and is too soon to evaluate progression towards rehab goals  patient doing well in rehab. tolerating workload increases and utilizing PLB for her shortness of breath  patient doing well in rehab. tolerating workload increases and utilizing PLB for her shortness of breath  patient doing well in rehab. tolerating workload increases and utilizing PLB for her shortness of breath. she is s/p cardioversion two weeks ago but is unable to tell a difference in how she feels.   Has been doing well in pulmonary rehab physically, but her daughter died 2 weeks ago and she has been absent.  She is nearing graduation.   Expected Outcomes  see admission expected outcomes  see admission expected outcomes  see admission expected outcomes  see admission expected outcomes  see admission expected  outcomes   Row Name 11/04/17 1550 11/21/17 0845 12/22/17 1022         Core Components/Risk Factors/Patient Goals Review   Personal Goals Review  Weight Management/Obesity;Improve shortness of breath with ADL's;Develop more efficient breathing techniques such as purse lipped breathing and diaphragmatic breathing and practicing self-pacing with activity.  Weight Management/Obesity;Improve shortness of breath with ADL's;Develop more efficient breathing techniques such as purse lipped breathing and diaphragmatic breathing and practicing self-pacing with activity.  Weight Management/Obesity;Improve shortness of breath with ADL's;Develop more efficient breathing techniques such as purse lipped breathing and diaphragmatic breathing and practicing self-pacing with activity.     Review  She has lost 7 kg, is going through the grief process and has her good and bad days and is in the process of possibly moving to an assisted living facility, she fell at home  and broke her nose  and is recovering from this, otherwise is progressing well.  Maintaing the 7 kg weight loss, back exercising with [place], but had a viral infection the last week and was not able to exercise with [place].  continues to lose weight, has presently lost 10 kg, making progress with exercise, level 6 on nustep and walking 14 laps on the track.     Expected Outcomes  to continue to progress as on admission outcomes  to continue to progress as on admission outcomes  see admission goals        Core Components/Risk Factors/Patient Goals at Discharge (Final Review):  Goals and Risk Factor Review - 12/22/17 1022      Core Components/Risk Factors/Patient Goals Review   Personal Goals Review  Weight Management/Obesity;Improve shortness of breath with ADL's;Develop more efficient breathing techniques such as purse lipped breathing and diaphragmatic breathing and practicing self-pacing with activity.    Review  continues to lose weight, has presently lost 10  kg, making progress with exercise, level 6 on nustep and walking 14 laps on the track.    Expected Outcomes  see admission goals       ITP Comments:   Comments: ITP REVIEW Pt is making expected progress toward pulmonary rehab goals after completing 32 sessions. Recommend continued exercise, life style modification, education, and utilization of breathing techniques to increase stamina and strength and decrease shortness of breath with exertion.

## 2017-12-24 ENCOUNTER — Ambulatory Visit (INDEPENDENT_AMBULATORY_CARE_PROVIDER_SITE_OTHER): Payer: Medicare Other | Admitting: Family Medicine

## 2017-12-24 VITALS — BP 102/68 | HR 66 | Temp 97.5°F | Ht 67.0 in | Wt 246.0 lb

## 2017-12-24 DIAGNOSIS — Z6838 Body mass index (BMI) 38.0-38.9, adult: Secondary | ICD-10-CM | POA: Diagnosis not present

## 2017-12-24 DIAGNOSIS — F3289 Other specified depressive episodes: Secondary | ICD-10-CM

## 2017-12-24 DIAGNOSIS — E559 Vitamin D deficiency, unspecified: Secondary | ICD-10-CM

## 2017-12-24 DIAGNOSIS — R7303 Prediabetes: Secondary | ICD-10-CM | POA: Diagnosis not present

## 2017-12-24 NOTE — Progress Notes (Signed)
Office: [phone]  /  Fax: [phone]   HPI:   Chief Complaint: OBESITY [name] [name] is here to discuss her progress with her obesity treatment plan. She is on the Category 2 plan + breakfast options and is following her eating plan approximately 85 % of the time. She states she is doing cardiac rehab for 45 minutes 2 times per week. [name] [name] has done well with weight loss. Hunger is mostly controlled. She is working on meal planning and increasing lean protein and vegetables. Her weight is 246 lb (111.6 kg) today and has had a weight loss of 8 pounds over a period of 2 weeks since her last visit. She has lost 21 lbs since starting treatment with [place].  Pre-Diabetes [name] [name] has a diagnosis of pre-diabetes based on her elevated Hgb A1c and was informed this puts her at greater risk of developing diabetes. She is not taking metformin currently and is attempting to control with diet and exercise. [name] [name] continues to work on diet and exercise to decrease risk of diabetes. She denies nausea or hypoglycemia. [name] [name] is due for labs.  Vitamin D deficiency [name] [name] has a diagnosis of vitamin D deficiency. She is currently taking OTC vit D and is not yet at goal. Monic denies nausea, vomiting or muscle weakness.   Ref. 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 [name] [name] struggles with emotional eating and using food for comfort to the extent that it is negatively impacting her health. She often snacks when she is not hungry. [name] [name] sometimes feels she is out of control and then feels guilty that she made poor food choices. She has been working on behavior modification techniques to help reduce her emotional eating and has been somewhat successful. She shows no sign of suicidal or homicidal  ideations.  Depression screen [facility] 2/9 09/17/2017 06/09/2017 01/15/2017 07/15/2016  Decreased Interest 2 3 0 0  Down, Depressed, Hopeless 2 - 0 0  PHQ - 2 Score 4 3 0 0  Altered sleeping 0 0 - -  Tired, decreased energy 3 3 - -  Change in appetite 3 3 - -  Feeling bad or failure about yourself  2 3 - -  Trouble concentrating 3 0 - -  Moving slowly or fidgety/restless 0 0 - -  Suicidal thoughts 0 0 - -  PHQ-9 Score 15 12 - -  Difficult doing work/chores Very difficult Not difficult at all - -      ALLERGIES: Allergies  Allergen Reactions  . Ancef [Cefazolin] Hives and Itching  . Pseudoephedrine Hcl     Other reaction(s): palpitations (moderate)  . Caffeine Palpitations    MEDICATIONS: Current Outpatient Medications on File Prior to Visit  Medication Sig Dispense Refill  . apixaban (ELIQUIS) 5 MG TABS tablet Take 1 tablet (5 mg total) by mouth 2 (two) times daily. 28 tablet 0  . Biotin 5000 MCG CAPS Take 5,000 mcg by mouth daily.     . Calcium Carbonate-Vit D-Min (CALCIUM 1200 PO) Take 2,400 mg by mouth daily.     . cholecalciferol (VITAMIN D) 1000 UNITS tablet Take 1,000 Units by mouth daily.     Marland Kitchen Co-Enzyme Q10 100 MG CAPS Take 100 mg by mouth daily.     . diazepam (VALIUM) 5 MG tablet TAKE 1 TABLET BY MOUTH ONCE  DAILY AS NEEDED FOR ANXIETY 30 tablet 1  . folic acid (FOLVITE) 263 MCG tablet Take 400 mcg by mouth daily.    Marland Kitchen glucosamine-chondroitin 500-400 MG tablet Take 1 tablet by mouth daily.     Marland Kitchen loratadine (KLS ALLERCLEAR) 10 MG tablet Take 10 mg by mouth daily.    Marland Kitchen MAGNESIUM CITRATE PO Take 250 mg by mouth daily. 1 tab daily    . metoprolol tartrate (LOPRESSOR) 25 MG tablet Take 0.5 tablets (12.5 mg total) by mouth 2 (two) times daily. 90 tablet 3  . nitroGLYCERIN (NITROSTAT) 0.4 MG SL tablet Place 0.4 mg under the tongue every 5 (five) minutes as needed for chest pain.    . pantoprazole (PROTONIX) 40 MG tablet TAKE 1 TABLET BY MOUTH DAILY 90 tablet 1  . Resveratrol 250  MG CAPS Take 250 mg by mouth daily.    . sertraline (ZOLOFT) 100 MG tablet Take 1 tablet (100 mg total) by mouth daily. 90 tablet 0  . Simethicone (GAS-X PO) Take 2 tablets by mouth daily as needed (bloating).     No current facility-administered medications on file prior to visit.     PAST MEDICAL HISTORY: Past Medical History:  Diagnosis Date  . Alpha-1-antitrypsin deficiency carrier ([place])   . Arthritis    "all over"  . Atrial fibrillation ([place])   . Back pain   . Complication of anesthesia    "I woke up during 2 different procedures" (10/04/2014)  . Frequent UTI   . GERD (gastroesophageal reflux disease)   . Hypertension   . MVP (mitral valve prolapse)   . Obesity   . Osteoarthritis   . Presence of permanent cardiac pacemaker   . PVC's (premature ventricular contractions)   . Shortness of breath   . 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 Sleep apnea   . Stroke [facility]) 01/2014   denies deficits on 10/04/2014  . Tachy-brady syndrome ([place]) 10/2014  . TIA (transient ischemic attack)     PAST SURGICAL HISTORY: Past Surgical History:  Procedure Laterality Date  . CARDIOVERSION N/A 09/02/2017   Procedure: CARDIOVERSION;  Surgeon: Sanda Klein, MD;  Location: [facility];  Service: Cardiovascular;  Laterality: N/A;  . EXCISION VAGINAL CYST     benign nodules  . INSERT / REPLACE / REMOVE PACEMAKER  10/04/2014   Medtronic Advisa model [id number] serial number [id number]  . LOOP RECORDER EXPLANT N/A 10/04/2014   Procedure: LOOP RECORDER EXPLANT;  Surgeon: Sanda Klein, MD;  Location: [place] CATH LAB;  Service: Cardiovascular;  Laterality: N/A;  . LOOP RECORDER IMPLANT N/A 04/19/2014   Procedure: LOOP RECORDER IMPLANT;  Surgeon: Sanda Klein, MD;  Location: [place] CATH LAB;  Service: Cardiovascular;  Laterality: N/A;  . NM MYOCAR PERF WALL MOTION  12/18/2007   normal  . PERMANENT PACEMAKER INSERTION N/A 10/04/2014   Procedure: PERMANENT  PACEMAKER INSERTION;  Surgeon: Sanda Klein, MD;  Location: [place] CATH LAB;  Service: Cardiovascular;  Laterality: N/A;  . TUBAL LIGATION    . US ECHOCARDIOGRAPHY  05/15/2010   LA mildly dilated,mild mitral annular ca+, AOV mildly sclerotic, mild asymmetric LVH    SOCIAL HISTORY: Social History   Tobacco Use  . Smoking status: Former Smoker    Packs/day: 2.00    Years: 18.00    Pack years: 36.00    Types: Cigarettes  . Smokeless tobacco: Never Used  . Tobacco comment: "quit smoking in 1971"  Substance Use Topics  . Alcohol use:  Yes    Alcohol/week: 0.0 oz    Comment: 10/04/2014 "might have a drink a couple times/yr"  . Drug use: No    FAMILY HISTORY: Family History  Problem Relation Age of Onset  . Heart attack Mother   . Sudden death Mother   . Depression Mother   . Stroke Father   . Heart failure Father   . Depression Father   . Heart failure Brother   . Stroke Brother     ROS: Review of Systems  Constitutional: Positive for weight loss.  Gastrointestinal: Negative for nausea and vomiting.  Musculoskeletal:       Negative muscle weakness  Endo/Heme/Allergies:       Negative hypoglycemia  Psychiatric/Behavioral: Positive for depression. Negative for suicidal ideas.    PHYSICAL EXAM: Blood pressure 102/68, pulse 66, temperature (!) 97.5 F (36.4 C), temperature source Oral, height 5' 7" (1.702 m), weight 246 lb (111.6 kg), SpO2 96 %. Body mass index is 38.53 kg/m. Physical Exam  RECENT LABS AND TESTS: BMET    Component Value Date/Time   NA 139 09/17/2017 1131   K 4.2 09/17/2017 1131   CL 102 09/17/2017 1131   CO2 21 09/17/2017 1131   GLUCOSE 97 09/17/2017 1131   GLUCOSE 92 01/15/2017 1417   BUN 14 09/17/2017 1131   CREATININE 1.07 (H) 09/17/2017 1131   CREATININE 0.86 09/28/2014 1557   CALCIUM 8.9 09/17/2017 1131   GFRNONAA 51 (L) 09/17/2017 1131   GFRNONAA 56 (L) 06/06/2014 1653   GFRAA 58 (L) 09/17/2017 1131   GFRAA 65 06/06/2014 1653   Lab  Results  Component Value Date   HGBA1C 6.2 (H) 09/17/2017   HGBA1C 5.9 (H) 03/09/2014   Lab Results  Component Value Date   INSULIN 47.7 (H) 09/17/2017   CBC    Component Value Date/Time   WBC 8.5 09/17/2017 1131   WBC 9.1 02/24/2017 1555   RBC 5.22 09/17/2017 1131   RBC 5.09 02/24/2017 1555   HGB 14.9 09/17/2017 1131   HCT 45.1 09/17/2017 1131   PLT 258 08/27/2017 1509   MCV 86 09/17/2017 1131   MCH 28.5 09/17/2017 1131   MCH 29.0 09/28/2014 1557   MCHC 33.0 09/17/2017 1131   MCHC 33.9 02/24/2017 1555   RDW 13.5 09/17/2017 1131   LYMPHSABS 2.0 09/17/2017 1131   MONOABS 1.3 (H) 02/24/2017 1555   EOSABS 0.2 09/17/2017 1131   BASOSABS 0.0 09/17/2017 1131   Iron/TIBC/Ferritin/ %Sat No results found for: IRON, TIBC, FERRITIN, IRONPCTSAT Lipid Panel     Component Value Date/Time   CHOL 164 09/17/2017 1131   TRIG 117 09/17/2017 1131   HDL 47 09/17/2017 1131   CHOLHDL 3 01/15/2017 1417   VLDL 33.6 01/15/2017 1417   LDLCALC 94 09/17/2017 1131   LDLDIRECT 81.0 07/15/2016 1419   Hepatic Function Panel     Component Value Date/Time   PROT 6.8 09/17/2017 1131   ALBUMIN 3.8 09/17/2017 1131   AST 30 09/17/2017 1131   ALT 16 09/17/2017 1131   ALKPHOS 81 09/17/2017 1131   BILITOT 0.4 09/17/2017 1131   BILIDIR 0.1 01/15/2017 1417      Component Value Date/Time   TSH 2.040 09/17/2017 1131   TSH 2.70 07/15/2016 1419   TSH 1.529 06/06/2014 1608     Ref. Range 09/17/2017 11:31  Vitamin D, 25-Hydroxy Latest Ref Range: 30.0 - 100.0 ng/mL 49.4   ASSESSMENT AND PLAN: Prediabetes - Plan: Comprehensive metabolic panel, Hemoglobin A1c, Insulin, random  Vitamin D deficiency -  Plan: VITAMIN D 25 Hydroxy (Vit-D Deficiency, Fractures)  Other depression - with emotional eating  Class 2 severe obesity with serious comorbidity and body mass index (BMI) of 38.0 to 38.9 in adult, unspecified obesity type [facility])  PLAN:  Pre-Diabetes [name] [name] will continue to work on weight loss,  exercise, and decreasing simple carbohydrates in her diet to help decrease the risk of diabetes. She was informed that eating too many simple carbohydrates or too many calories at one sitting increases the likelihood of GI side effects. We will check labs and [name] [name] agreed to follow up with [place] as directed to monitor her progress.  Vitamin D Deficiency [name] [name] was informed that low vitamin D levels contributes to fatigue and are associated with obesity, breast, and colon cancer. She agrees to continue to OTC vitamin D. We will check labs and will follow up for routine testing of vitamin D, at least 2-3 times per year. She was informed of the risk of over-replacement of vitamin D and agrees to not increase her dose unless she discusses this with [place] first. [name] [name] agrees to follow up with our clinic in 2 weeks.  Depression with Emotional Eating Behaviors We discussed behavior modification techniques today to help [name] [name] deal with her emotional eating and depression. She has agreed to continue Zoloft 100 mg qd and follow up as directed.  Obesity [name] [name] is currently in the action stage of change. As such, her goal is to continue with weight loss efforts She has agreed to follow the Category 2 plan [name] [name] has been instructed to work up to a goal of 150 minutes of combined cardio and strengthening exercise per week for weight loss and overall health benefits. We discussed the following Behavioral Modification Strategies today: increase H2O intake, increasing lean protein intake, work on meal planning and easy cooking plans and emotional eating strategies  [name] [name] has agreed to follow up with our clinic in 2 weeks. She was informed of the importance of frequent follow up visits to maximize her success with intensive lifestyle modifications for her multiple health conditions.   OBESITY BEHAVIORAL INTERVENTION VISIT  Today's visit was # 7 out of 22.  Starting weight: 267 lbs Starting date:  09/17/17 Today's weight : 246 lbs Today's date: 12/24/2017 Total lbs lost to date: 21 (Patients must lose 7 lbs in the first 6 months to continue with counseling)   ASK: We discussed the diagnosis of obesity with [name] [name] today and [name] [name] agreed to give [place] permission to discuss obesity behavioral modification therapy today.  ASSESS: Anahid has the diagnosis of obesity and her BMI today is 38.52 Torry is in the action stage of change   ADVISE: [name] [name] was educated on the multiple health risks of obesity as well as the benefit of weight loss to improve her health. She was advised of the need for long term treatment and the importance of lifestyle modifications.  AGREE: Multiple dietary modification options and treatment options were discussed and  [name] [name] agreed to the above obesity treatment plan.  I, Doreene Nest, am acting as transcriptionist for Dennard Nip, MD  I have reviewed the above documentation for accuracy and completeness, and I agree with the above. -Dennard Nip, MD

## 2017-12-25 ENCOUNTER — Encounter (HOSPITAL_COMMUNITY)
Admission: RE | Admit: 2017-12-25 | Discharge: 2017-12-25 | Disposition: A | Discharge status: Home / Self Care | Payer: Medicare Other | Source: Ambulatory Visit | Attending: Internal Medicine | Admitting: Internal Medicine

## 2017-12-25 VITALS — Wt 252.9 lb

## 2017-12-25 DIAGNOSIS — M1990 Unspecified osteoarthritis, unspecified site: Secondary | ICD-10-CM | POA: Diagnosis not present

## 2017-12-25 DIAGNOSIS — R0609 Other forms of dyspnea: Secondary | ICD-10-CM | POA: Diagnosis not present

## 2017-12-25 DIAGNOSIS — Z79899 Other long term (current) drug therapy: Secondary | ICD-10-CM | POA: Diagnosis not present

## 2017-12-25 DIAGNOSIS — Z950 Presence of cardiac pacemaker: Secondary | ICD-10-CM | POA: Diagnosis not present

## 2017-12-25 DIAGNOSIS — Z7901 Long term (current) use of anticoagulants: Secondary | ICD-10-CM | POA: Diagnosis not present

## 2017-12-25 DIAGNOSIS — Z7951 Long term (current) use of inhaled steroids: Secondary | ICD-10-CM | POA: Diagnosis not present

## 2017-12-25 LAB — COMPREHENSIVE METABOLIC PANEL
ALBUMIN: 4.2 g/dL (ref 3.5–4.8)
ALT: 17 IU/L (ref 0–32)
AST: 27 IU/L (ref 0–40)
Albumin/Globulin Ratio: 1.2 (ref 1.2–2.2)
Alkaline Phosphatase: 77 IU/L (ref 39–117)
BUN/Creatinine Ratio: 15 (ref 12–28)
BUN: 17 mg/dL (ref 8–27)
Bilirubin Total: 0.5 mg/dL (ref 0.0–1.2)
CO2: 24 mmol/L (ref 20–29)
Calcium: 9.5 mg/dL (ref 8.7–10.3)
Chloride: 104 mmol/L (ref 96–106)
Creatinine, Ser: 1.12 mg/dL — ABNORMAL HIGH (ref 0.57–1.00)
GFR, EST AFRICAN AMERICAN: 55 mL/min/{1.73_m2} — AB (ref >59)
GFR, EST NON AFRICAN AMERICAN: 47 mL/min/{1.73_m2} — AB (ref >59)
GLUCOSE: 121 mg/dL — AB (ref 65–99)
Globulin, Total: 3.4 g/dL (ref 1.5–4.5)
Potassium: 4.7 mmol/L (ref 3.5–5.2)
Sodium: 142 mmol/L (ref 134–144)
TOTAL PROTEIN: 7.6 g/dL (ref 6.0–8.5)

## 2017-12-25 LAB — INSULIN, RANDOM: INSULIN: 19.4 u[IU]/mL (ref 2.6–24.9)

## 2017-12-25 LAB — HEMOGLOBIN A1C
Est. average glucose Bld gHb Est-mCnc: 120 mg/dL
Hgb A1c MFr Bld: 5.8 % — ABNORMAL HIGH (ref 4.8–5.6)

## 2017-12-25 LAB — VITAMIN D 25 HYDROXY (VIT D DEFICIENCY, FRACTURES): VIT D 25 HYDROXY: 46.7 ng/mL (ref 30.0–100.0)

## 2017-12-25 NOTE — Progress Notes (Signed)
Daily Session Note  Patient Details  Name: [name] [name] MRN: [id number] Date of Birth: 1941-05-30 Referring Provider:     Pulmonary Rehab Walk Test from 06/17/2017 in [place]  Referring Provider  Dr. Annamaria Boots      Encounter Date: 12/25/2017  Check In: Session Check In - 12/25/17 1330      Check-In   Location  [facility]    Staff Present  Rosebud Poles, RN, Luisa Hart, RN, BSN;Molly diVincenzo, MS, ACSM RCEP, Exercise Physiologist    Supervising physician immediately available to respond to emergencies  [facility] immediately available    Physician(s)  Dr. Bonner Puna    Medication changes reported      No    Fall or balance concerns reported     No    Tobacco Cessation  No Change    Warm-up and Cool-down  Performed as group-led instruction    Resistance Training Performed  Yes    VAD Patient?  No      Pain Assessment   Currently in Pain?  No/denies    Multiple Pain Sites  No       Capillary Blood Glucose: No results found for this or any previous visit (from the past 24 hour(s)).  Exercise Prescription Changes - 12/25/17 1600      Response to Exercise   Blood Pressure (Admit)  96/54    Blood Pressure (Exercise)  136/70    Blood Pressure (Exit)  104/60    Heart Rate (Admit)  74 bpm    Heart Rate (Exercise)  115 bpm    Heart Rate (Exit)  65 bpm    Oxygen Saturation (Admit)  96 %    Oxygen Saturation (Exercise)  92 %    Oxygen Saturation (Exit)  96 %    Rating of Perceived Exertion (Exercise)  15    Perceived Dyspnea (Exercise)  3    Duration  Progress to 45 minutes of aerobic exercise without signs/symptoms of physical distress    Intensity  THRR unchanged      Progression   Progression  Continue to progress workloads to maintain intensity without signs/symptoms of physical distress.      Resistance Training   Training Prescription  Yes    Weight  orange bands    Reps  10-15    Time  10 Minutes      Interval Training   Interval Training  No      Arm Ergometer   Level  5    Minutes  17      Track   Laps  11    Minutes  17       Social History   Tobacco Use  Smoking Status Former Smoker  . Packs/day: 2.00  . Years: 18.00  . Pack years: 36.00  . Types: Cigarettes  Smokeless Tobacco Never Used  Tobacco Comment   "quit smoking in 1971"    Goals Met:  Exercise tolerated well Strength training completed today  Goals Unmet:  Not Applicable  Comments: Service time is from 1330 to 1530    Dr. Rush Farmer is Medical Director for Pulmonary Rehab at [facility].

## 2017-12-29 ENCOUNTER — Other Ambulatory Visit: Payer: Self-pay | Admitting: Family Medicine

## 2017-12-30 ENCOUNTER — Encounter (HOSPITAL_COMMUNITY)
Admission: RE | Admit: 2017-12-30 | Discharge: 2017-12-30 | Disposition: A | Discharge status: Home / Self Care | Payer: Medicare Other | Source: Ambulatory Visit | Attending: Internal Medicine | Admitting: Internal Medicine

## 2017-12-30 VITALS — Wt 250.7 lb

## 2017-12-30 DIAGNOSIS — R0609 Other forms of dyspnea: Secondary | ICD-10-CM | POA: Diagnosis not present

## 2017-12-30 DIAGNOSIS — Z950 Presence of cardiac pacemaker: Secondary | ICD-10-CM | POA: Diagnosis not present

## 2017-12-30 DIAGNOSIS — Z79899 Other long term (current) drug therapy: Secondary | ICD-10-CM | POA: Diagnosis not present

## 2017-12-30 DIAGNOSIS — Z7901 Long term (current) use of anticoagulants: Secondary | ICD-10-CM | POA: Diagnosis not present

## 2017-12-30 DIAGNOSIS — M1990 Unspecified osteoarthritis, unspecified site: Secondary | ICD-10-CM | POA: Diagnosis not present

## 2017-12-30 DIAGNOSIS — Z7951 Long term (current) use of inhaled steroids: Secondary | ICD-10-CM | POA: Diagnosis not present

## 2017-12-30 NOTE — Progress Notes (Signed)
Daily Session Note  Patient Details  Name: [name] [name] MRN: [id number] Date of Birth: 1941-07-30 Referring Provider:     Pulmonary Rehab Walk Test from 06/17/2017 in [place]  Referring Provider  Dr. Annamaria Boots      Encounter Date: 12/30/2017  Check In: Session Check In - 12/30/17 1551      Check-In   Location  [facility]    Staff Present  Rosebud Poles, RN, BSN;Molly diVincenzo, MS, ACSM RCEP, Exercise Physiologist;Ayelen Sciortino Rollene Rotunda, RN, BSN    Supervising physician immediately available to respond to emergencies  [facility] immediately available    Physician(s)  Dr. Bonner Puna    Medication changes reported      No    Fall or balance concerns reported     No    Tobacco Cessation  No Change    Warm-up and Cool-down  Performed as group-led instruction    Resistance Training Performed  Yes    VAD Patient?  No      Pain Assessment   Currently in Pain?  No/denies    Multiple Pain Sites  No       Capillary Blood Glucose: No results found for this or any previous visit (from the past 24 hour(s)).  Exercise Prescription Changes - 12/30/17 1500      Response to Exercise   Blood Pressure (Admit)  96/54    Blood Pressure (Exercise)  94/54    Blood Pressure (Exit)  100/58    Heart Rate (Admit)  73 bpm    Heart Rate (Exercise)  113 bpm    Heart Rate (Exit)  70 bpm    Oxygen Saturation (Admit)  94 %    Oxygen Saturation (Exercise)  95 %    Oxygen Saturation (Exit)  91 %    Rating of Perceived Exertion (Exercise)  15    Perceived Dyspnea (Exercise)  3    Duration  Progress to 45 minutes of aerobic exercise without signs/symptoms of physical distress    Intensity  THRR unchanged      Progression   Progression  Continue to progress workloads to maintain intensity without signs/symptoms of physical distress.      Resistance Training   Training Prescription  Yes    Weight  orange bands    Reps  10-15    Time  10 Minutes      Interval Training   Interval Training  No      NuStep   Level  6    Minutes  17    METs  2.6      Arm Ergometer   Level  5    Minutes  17      Track   Laps  11    Minutes  17       Social History   Tobacco Use  Smoking Status Former Smoker  . Packs/day: 2.00  . Years: 18.00  . Pack years: 36.00  . Types: Cigarettes  Smokeless Tobacco Never Used  Tobacco Comment   "quit smoking in 1971"    Goals Met:  Independence with exercise equipment Improved SOB with ADL's Using PLB without cueing & demonstrates good technique Achieving weight loss Exercise tolerated well No report of cardiac concerns or symptoms Strength training completed today  Goals Unmet:  Not Applicable  Comments: Service time is from 1330 to 1510   Dr. Rush Farmer is Medical Director for Pulmonary Rehab at [facility].

## 2018-01-05 ENCOUNTER — Ambulatory Visit (INDEPENDENT_AMBULATORY_CARE_PROVIDER_SITE_OTHER): Payer: Medicare Other | Admitting: *Deleted

## 2018-01-05 ENCOUNTER — Telehealth: Payer: Self-pay | Admitting: Cardiology

## 2018-01-05 DIAGNOSIS — I495 Sick sinus syndrome: Secondary | ICD-10-CM | POA: Diagnosis not present

## 2018-01-05 NOTE — Telephone Encounter (Signed)
Spoke with pt and reminded pt of remote transmission that is due today. Pt verbalized understanding.   

## 2018-01-06 ENCOUNTER — Encounter: Payer: Self-pay | Admitting: Cardiology

## 2018-01-06 ENCOUNTER — Encounter (HOSPITAL_COMMUNITY)
Admission: RE | Admit: 2018-01-06 | Discharge: 2018-01-06 | Disposition: A | Discharge status: Home / Self Care | Payer: Medicare Other | Source: Ambulatory Visit | Attending: Internal Medicine | Admitting: Internal Medicine

## 2018-01-06 DIAGNOSIS — R0609 Other forms of dyspnea: Secondary | ICD-10-CM | POA: Insufficient documentation

## 2018-01-06 DIAGNOSIS — I4891 Unspecified atrial fibrillation: Secondary | ICD-10-CM | POA: Diagnosis not present

## 2018-01-06 DIAGNOSIS — Z950 Presence of cardiac pacemaker: Secondary | ICD-10-CM | POA: Diagnosis not present

## 2018-01-06 DIAGNOSIS — I341 Nonrheumatic mitral (valve) prolapse: Secondary | ICD-10-CM | POA: Diagnosis not present

## 2018-01-06 DIAGNOSIS — I495 Sick sinus syndrome: Secondary | ICD-10-CM | POA: Diagnosis not present

## 2018-01-06 DIAGNOSIS — Z87891 Personal history of nicotine dependence: Secondary | ICD-10-CM | POA: Insufficient documentation

## 2018-01-06 DIAGNOSIS — Z7901 Long term (current) use of anticoagulants: Secondary | ICD-10-CM | POA: Insufficient documentation

## 2018-01-06 DIAGNOSIS — Z7951 Long term (current) use of inhaled steroids: Secondary | ICD-10-CM | POA: Diagnosis not present

## 2018-01-06 DIAGNOSIS — Z8673 Personal history of transient ischemic attack (TIA), and cerebral infarction without residual deficits: Secondary | ICD-10-CM | POA: Diagnosis not present

## 2018-01-06 DIAGNOSIS — E669 Obesity, unspecified: Secondary | ICD-10-CM | POA: Insufficient documentation

## 2018-01-06 DIAGNOSIS — Z6841 Body Mass Index (BMI) 40.0 and over, adult: Secondary | ICD-10-CM | POA: Diagnosis not present

## 2018-01-06 DIAGNOSIS — K219 Gastro-esophageal reflux disease without esophagitis: Secondary | ICD-10-CM | POA: Insufficient documentation

## 2018-01-06 DIAGNOSIS — Z79899 Other long term (current) drug therapy: Secondary | ICD-10-CM | POA: Insufficient documentation

## 2018-01-06 DIAGNOSIS — I10 Essential (primary) hypertension: Secondary | ICD-10-CM | POA: Diagnosis not present

## 2018-01-06 DIAGNOSIS — M1990 Unspecified osteoarthritis, unspecified site: Secondary | ICD-10-CM | POA: Diagnosis not present

## 2018-01-06 NOTE — Progress Notes (Signed)
Remote pacemaker transmission.   

## 2018-01-07 ENCOUNTER — Ambulatory Visit (INDEPENDENT_AMBULATORY_CARE_PROVIDER_SITE_OTHER): Payer: Medicare Other | Admitting: Family Medicine

## 2018-01-07 VITALS — BP 113/64 | HR 67 | Temp 98.1°F | Ht 67.0 in | Wt 248.0 lb

## 2018-01-07 DIAGNOSIS — Z6838 Body mass index (BMI) 38.0-38.9, adult: Secondary | ICD-10-CM | POA: Diagnosis not present

## 2018-01-07 DIAGNOSIS — F3289 Other specified depressive episodes: Secondary | ICD-10-CM

## 2018-01-07 NOTE — Progress Notes (Signed)
Office: [phone]  /  Fax: [phone]   HPI:   Chief Complaint: OBESITY [name] [name] is here to discuss her progress with her obesity treatment plan. She is on the Category 2 plan and is following her eating plan approximately 70 % of the time. She states she is walking for 10 minutes 3 times per week and doing pulmonary rehab for 40 minutes 2 times per week. [name] [name] is retaining fluid today. She is doing well following her category 2 plan mostly, but she has had increased cravings and emotional eating. Her weight is 248 lb (112.5 kg) today and has had a weight gain of 2 pounds over a period of 2 weeks since her last visit. She has lost 19 lbs since starting treatment with [place].  Depression with emotional eating behaviors [name] [name] is stable on zoloft. She is still grieving her daughters death and is tearful on occasion. Her grieving appears appropriate and she has no suicidal or homicidal ideations. She still struggles with some emotional eating. [name] [name] struggles with emotional eating and using food for comfort to the extent that it is negatively impacting her health. She often snacks when she is not hungry. [name] [name] sometimes feels she is out of control and then feels guilty that she made poor food choices. She has been working on behavior modification techniques to help reduce her emotional eating and has been somewhat successful.   Depression screen [facility] 2/9 09/17/2017 06/09/2017 01/15/2017 07/15/2016  Decreased Interest 2 3 0 0  Down, Depressed, Hopeless 2 - 0 0  PHQ - 2 Score 4 3 0 0  Altered sleeping 0 0 - -  Tired, decreased energy 3 3 - -  Change in appetite 3 3 - -  Feeling bad or failure about yourself  2 3 - -  Trouble concentrating 3 0 - -  Moving slowly or fidgety/restless 0 0 - -  Suicidal thoughts 0 0 - -  PHQ-9 Score 15 12 - -  Difficult doing work/chores Very difficult Not difficult at all - -      ALLERGIES: Allergies  Allergen Reactions  . Ancef [Cefazolin] Hives and  Itching  . Pseudoephedrine Hcl     Other reaction(s): palpitations (moderate)  . Caffeine Palpitations    MEDICATIONS: Current Outpatient Medications on File Prior to Visit  Medication Sig Dispense Refill  . apixaban (ELIQUIS) 5 MG TABS tablet Take 1 tablet (5 mg total) by mouth 2 (two) times daily. 28 tablet 0  . Biotin 5000 MCG CAPS Take 5,000 mcg by mouth daily.     . Calcium Carbonate-Vit D-Min (CALCIUM 1200 PO) Take 2,400 mg by mouth daily.     . cholecalciferol (VITAMIN D) 1000 UNITS tablet Take 1,000 Units by mouth daily.     Marland Kitchen Co-Enzyme Q10 100 MG CAPS Take 100 mg by mouth daily.     . diazepam (VALIUM) 5 MG tablet TAKE 1 TABLET BY MOUTH ONCE DAILY AS NEEDED FOR ANXIETY 30 tablet 1  . folic acid (FOLVITE) 308 MCG tablet Take 400 mcg by mouth daily.    Marland Kitchen glucosamine-chondroitin 500-400 MG tablet Take 1 tablet by mouth daily.     Marland Kitchen loratadine (KLS ALLERCLEAR) 10 MG tablet Take 10 mg by mouth daily.    Marland Kitchen MAGNESIUM CITRATE PO Take 250 mg by mouth daily. 1 tab daily    . metoprolol tartrate (LOPRESSOR) 25 MG tablet Take 0.5 tablets (12.5 mg total) by mouth 2 (two) times daily. 90 tablet 3  . nitroGLYCERIN (NITROSTAT) 0.4  MG SL tablet Place 0.4 mg under the tongue every 5 (five) minutes as needed for chest pain.    . pantoprazole (PROTONIX) 40 MG tablet TAKE 1 TABLET BY MOUTH DAILY 90 tablet 1  . Resveratrol 250 MG CAPS Take 250 mg by mouth daily.    . sertraline (ZOLOFT) 100 MG tablet TAKE 1 TABLET BY  MOUTH DAILY 90 tablet 0  . Simethicone (GAS-X PO) Take 2 tablets by mouth daily as needed (bloating).     No current facility-administered medications on file prior to visit.     PAST MEDICAL HISTORY: Past Medical History:  Diagnosis Date  . Alpha-1-antitrypsin deficiency carrier ([place])   . Arthritis    "all over"  . Atrial fibrillation ([place])   . Back pain   . Complication of anesthesia    "I woke up during 2 different procedures" (10/04/2014)  . Frequent UTI   . GERD  (gastroesophageal reflux disease)   . Hypertension   . MVP (mitral valve prolapse)   . Obesity   . Osteoarthritis   . Presence of permanent cardiac pacemaker   . PVC's (premature ventricular contractions)   . Shortness of breath   . 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 Sleep apnea   . Stroke [facility]) 01/2014   denies deficits on 10/04/2014  . Tachy-brady syndrome ([place]) 10/2014  . TIA (transient ischemic attack)     PAST SURGICAL HISTORY: Past Surgical History:  Procedure Laterality Date  . CARDIOVERSION N/A 09/02/2017   Procedure: CARDIOVERSION;  Surgeon: Sanda Klein, MD;  Location: [facility];  Service: Cardiovascular;  Laterality: N/A;  . EXCISION VAGINAL CYST     benign nodules  . INSERT / REPLACE / REMOVE PACEMAKER  10/04/2014   Medtronic Advisa model [id number] serial number [id number]  . LOOP RECORDER EXPLANT N/A 10/04/2014   Procedure: LOOP RECORDER EXPLANT;  Surgeon: Sanda Klein, MD;  Location: [place] CATH LAB;  Service: Cardiovascular;  Laterality: N/A;  . LOOP RECORDER IMPLANT N/A 04/19/2014   Procedure: LOOP RECORDER IMPLANT;  Surgeon: Sanda Klein, MD;  Location: [place] CATH LAB;  Service: Cardiovascular;  Laterality: N/A;  . NM MYOCAR PERF WALL MOTION  12/18/2007   normal  . PERMANENT PACEMAKER INSERTION N/A 10/04/2014   Procedure: PERMANENT PACEMAKER INSERTION;  Surgeon: Sanda Klein, MD;  Location: [place] CATH LAB;  Service: Cardiovascular;  Laterality: N/A;  . TUBAL LIGATION    . US ECHOCARDIOGRAPHY  05/15/2010   LA mildly dilated,mild mitral annular ca+, AOV mildly sclerotic, mild asymmetric LVH    SOCIAL HISTORY: Social History   Tobacco Use  . Smoking status: Former Smoker    Packs/day: 2.00    Years: 18.00    Pack years: 36.00    Types: Cigarettes  . Smokeless tobacco: Never Used  . Tobacco comment: "quit smoking in 1971"  Substance Use Topics  . Alcohol use: Yes    Alcohol/week: 0.0 oz    Comment: 10/04/2014  "might have a drink a couple times/yr"  . Drug use: No    FAMILY HISTORY: Family History  Problem Relation Age of Onset  . Heart attack Mother   . Sudden death Mother   . Depression Mother   . Stroke Father   . Heart failure Father   . Depression Father   . Heart failure Brother   . Stroke Brother     ROS: Review of Systems  Constitutional: Negative for weight loss.  Psychiatric/Behavioral: Positive for depression. Negative for  suicidal ideas.    PHYSICAL EXAM: Blood pressure 113/64, pulse 67, temperature 98.1 F (36.7 C), temperature source Oral, height _0  (1.702 m), weight 248 lb (112.5 kg), SpO2 95 %. Body mass index is 38.84 kg/m. Physical Exam  Constitutional: She is oriented to person, place, and time. She appears well-developed and well-nourished.  Cardiovascular: Normal rate.  Pulmonary/Chest: Effort normal.  Musculoskeletal: Normal range of motion.  Neurological: She is oriented to person, place, and time.  Skin: Skin is warm and dry.  Vitals reviewed.   RECENT LABS AND TESTS: BMET    Component Value Date/Time   NA 142 12/24/2017 1140   K 4.7 12/24/2017 1140   CL 104 12/24/2017 1140   CO2 24 12/24/2017 1140   GLUCOSE 121 (H) 12/24/2017 1140   GLUCOSE 92 01/15/2017 1417   BUN 17 12/24/2017 1140   CREATININE 1.12 (H) 12/24/2017 1140   CREATININE 0.86 09/28/2014 1557   CALCIUM 9.5 12/24/2017 1140   GFRNONAA 47 (L) 12/24/2017 1140   GFRNONAA 56 (L) 06/06/2014 1653   GFRAA 55 (L) 12/24/2017 1140   GFRAA 65 06/06/2014 1653   Lab Results  Component Value Date   HGBA1C 5.8 (H) 12/24/2017   HGBA1C 6.2 (H) 09/17/2017   HGBA1C 5.9 (H) 03/09/2014   Lab Results  Component Value Date   INSULIN 19.4 12/24/2017   INSULIN 47.7 (H) 09/17/2017   CBC    Component Value Date/Time   WBC 8.5 09/17/2017 1131   WBC 9.1 02/24/2017 1555   RBC 5.22 09/17/2017 1131   RBC 5.09 02/24/2017 1555   HGB 14.9 09/17/2017 1131   HCT 45.1 09/17/2017 1131   PLT 258  08/27/2017 1509   MCV 86 09/17/2017 1131   MCH 28.5 09/17/2017 1131   MCH 29.0 09/28/2014 1557   MCHC 33.0 09/17/2017 1131   MCHC 33.9 02/24/2017 1555   RDW 13.5 09/17/2017 1131   LYMPHSABS 2.0 09/17/2017 1131   MONOABS 1.3 (H) 02/24/2017 1555   EOSABS 0.2 09/17/2017 1131   BASOSABS 0.0 09/17/2017 1131   Iron/TIBC/Ferritin/ %Sat No results found for: IRON, TIBC, FERRITIN, IRONPCTSAT Lipid Panel     Component Value Date/Time   CHOL 164 09/17/2017 1131   TRIG 117 09/17/2017 1131   HDL 47 09/17/2017 1131   CHOLHDL 3 01/15/2017 1417   VLDL 33.6 01/15/2017 1417   LDLCALC 94 09/17/2017 1131   LDLDIRECT 81.0 07/15/2016 1419   Hepatic Function Panel     Component Value Date/Time   PROT 7.6 12/24/2017 1140   ALBUMIN 4.2 12/24/2017 1140   AST 27 12/24/2017 1140   ALT 17 12/24/2017 1140   ALKPHOS 77 12/24/2017 1140   BILITOT 0.5 12/24/2017 1140   BILIDIR 0.1 01/15/2017 1417      Component Value Date/Time   TSH 2.040 09/17/2017 1131   TSH 2.70 07/15/2016 1419   TSH 1.529 06/06/2014 1608    ASSESSMENT AND PLAN: Other depression  Class 2 severe obesity with serious comorbidity and body mass index (BMI) of 38.0 to 38.9 in adult, unspecified obesity type (HCC)  PLAN:  Depression with Emotional Eating Behaviors We discussed behavior modification techniques today to help decrease her emotional eating and depression. She has agreed to continue Zoloft 100 mg qd and follow up as directed.  We spent > than 50% of the 15 minute visit on the counseling as documented in the note.  Obesity Ticara is currently in the action stage of change. As such, her goal is to continue with weight loss  efforts She has agreed to follow the Category 2 plan Rether has been instructed to work up to a goal of 150 minutes of combined cardio and strengthening exercise per week for weight loss and overall health benefits. We discussed the following Behavioral Modification Strategies today: keeping  healthy foods in the home, work on meal planning and easy cooking plans and emotional eating strategies  Terria has agreed to follow up with our clinic in 2 weeks. She was informed of the importance of frequent follow up visits to maximize her success with intensive lifestyle modifications for her multiple health conditions.   OBESITY BEHAVIORAL INTERVENTION VISIT  Today's visit was # 8 out of 22.  Starting weight: 267 lbs Starting date: 09/17/17 Today's weight : 248 lbs Today's date: 01/07/2018 Total lbs lost to date: 56 (Patients must lose 7 lbs in the first 6 months to continue with counseling)   ASK: We discussed the diagnosis of obesity with [name] today and Marisal agreed to give [place] permission to discuss obesity behavioral modification therapy today.  ASSESS: Ednah has the diagnosis of obesity and her BMI today is 38.83 Weslee is in the action stage of change   ADVISE: Ozell was educated on the multiple health risks of obesity as well as the benefit of weight loss to improve her health. She was advised of the need for long term treatment and the importance of lifestyle modifications.  AGREE: Multiple dietary modification options and treatment options were discussed and  Alva agreed to the above obesity treatment plan.  I, Doreene Nest, am acting as transcriptionist for Dennard Nip, MD  I have reviewed the above documentation for accuracy and completeness, and I agree with the above. -Dennard Nip, MD

## 2018-01-08 ENCOUNTER — Ambulatory Visit: Payer: Medicare Other | Admitting: Internal Medicine

## 2018-01-08 DIAGNOSIS — N644 Mastodynia: Secondary | ICD-10-CM | POA: Diagnosis not present

## 2018-01-15 ENCOUNTER — Other Ambulatory Visit: Payer: Self-pay

## 2018-01-15 ENCOUNTER — Ambulatory Visit: Payer: Self-pay | Admitting: *Deleted

## 2018-01-15 ENCOUNTER — Ambulatory Visit (INDEPENDENT_AMBULATORY_CARE_PROVIDER_SITE_OTHER): Payer: Medicare Other | Admitting: Family Medicine

## 2018-01-15 ENCOUNTER — Encounter: Payer: Self-pay | Admitting: Family Medicine

## 2018-01-15 ENCOUNTER — Telehealth: Payer: Self-pay | Admitting: Family Medicine

## 2018-01-15 VITALS — BP 108/70 | HR 68 | Temp 98.0°F | Resp 17 | Ht 67.0 in | Wt 253.1 lb

## 2018-01-15 DIAGNOSIS — H6981 Other specified disorders of Eustachian tube, right ear: Secondary | ICD-10-CM

## 2018-01-15 MED ORDER — FLUTICASONE PROPIONATE 50 MCG/ACT NA SUSP: 2.0000 | Freq: Every day | NASAL | 6 refills | Status: DC

## 2018-01-15 NOTE — Telephone Encounter (Signed)
LM for patient letting her know that she does need to be seen today in order to receive medication.

## 2018-01-15 NOTE — Telephone Encounter (Signed)
Pt will need to keep her appt

## 2018-01-15 NOTE — Patient Instructions (Addendum)
Schedule a follow up with me in 6 months to recheck mood and other issues You have what's called Eustachian Tube Dysfunction Continue the daily Claritin and Zyrtec Drink plenty of fluids Call with any questions or concerns Hang in there! Happy Valentine's Day!!!

## 2018-01-15 NOTE — Telephone Encounter (Signed)
Triage note has been routed to PCP.  I will also route this phone note to Dr. Birdie Riddle since it is a second call.  Not sure that anything will be called in without an appointment, but forwarding to PCP for her to make that decision.

## 2018-01-15 NOTE — Telephone Encounter (Signed)
Pt will need to keep appt

## 2018-01-15 NOTE — Telephone Encounter (Signed)
Patient is calling to request medication be sent to pharmacy for her- she has a lot of medical problems and does not want to come to the office to be exposed to other illnesses if she does not have to. Told patient she would probably have to come in in order to be evaluated for her ear pain. She may ask for a mask when she arrives. She did want the request sent in and ask for a call back to let her know. A message may be left on her voice mail tell her the provider's response. Her number is [phone]. Reason for Disposition . Earache  (Exceptions: brief ear pain of < 60 minutes duration, earache occurring during air travel  Answer Assessment - Initial Assessment Questions 1. LOCATION: "Which ear is involved?"     right 2. ONSET: "When did the ear start hurting"      10 days ago 3. SEVERITY: "How bad is the pain?"  (Scale 1-10; mild, moderate or severe)   - MILD (1-3): doesn't interfere with normal activities    - MODERATE (4-7): interferes with normal activities or awakens from sleep    - SEVERE (8-10): excruciating pain, unable to do any normal activities      2 4. URI SYMPTOMS: " Do you have a runny nose or cough?"     Patient has lung disease so she has cough- she has a lot of sinus drainage 5. FEVER: "Do you have a fever?" If so, ask: "What is your temperature, how was it measured, and when did it start?"     no 6. CAUSE: "Have you been swimming recently?", "How often do you use Q-TIPS?", "Have you had any recent air travel or scuba diving?"     no 7. OTHER SYMPTOMS: "Do you have any other symptoms?" (e.g., headache, stiff neck, dizziness, vomiting, runny nose, decreased hearing)     No- patient had big fall and wonders if she hurt her sinus when she fell 8. PREGNANCY: "Is there any chance you are pregnant?" "When was your last menstrual period?"     n/a  Protocols used: EARACHE-A-AH

## 2018-01-15 NOTE — Progress Notes (Signed)
   Subjective:    [name], female    DOB: Apr 21, 1941, 77 y.o.   MRN: [id number]  HPI Ear pain- R ear.  sxs started 'in the last couple weeks'.  No drainage.  No fevers.  No dizziness.  No itching of ears.  No known sick contacts.   Review of Systems For ROS see HPI     Objective:   Physical Exam  Constitutional: She appears well-developed and well-nourished. No distress.  HENT:  Head: Normocephalic and atraumatic.  Right Ear: Tympanic membrane is retracted.  Left Ear: Tympanic membrane normal.  Nose: Mucosal edema and rhinorrhea present. Right sinus exhibits no maxillary sinus tenderness and no frontal sinus tenderness. Left sinus exhibits no maxillary sinus tenderness and no frontal sinus tenderness.  Mouth/Throat: Mucous membranes are normal. Posterior oropharyngeal erythema (w/ PND) present.  Eyes: Conjunctivae and EOM are normal. Pupils are equal, round, and reactive to light.  Neck: Normal range of motion. Neck supple.  Cardiovascular: Normal rate, regular rhythm and normal heart sounds.  Pulmonary/Chest: Effort normal and breath sounds normal. No respiratory distress. She has no wheezes. She has no rales.  Lymphadenopathy:    She has no cervical adenopathy.  Vitals reviewed.         Assessment & Plan:  R eustachian tube dysfxn- new.  Reviewed dx w/ pt and tx plans.  Continue daily antihistamine and add nasal steroid.  Reviewed supportive care and red flags that should prompt return.  Pt expressed understanding and is in agreement w/ plan.

## 2018-01-15 NOTE — Telephone Encounter (Signed)
Copied from [place]. Topic: Quick Communication - See Telephone Encounter >> Jan 15, 2018  9:26 AM Boyd Kerbs wrote: CRM for notification. See Telephone encounter for:   Pt. Is wanting medicine called in for her ear.. She has appt. Today at 3:15 with Dr. Birdie Riddle, but did not want to come in if she did have to.  She said with her age and with issues she has, she does not want to be around a lot of sick patients.  Please call patient and let her know.   If calls something in for her, please cancel appt for this afternoon 01/15/18.

## 2018-01-15 NOTE — Telephone Encounter (Signed)
See triage note- call to patient and triaged for provider.

## 2018-01-19 NOTE — Progress Notes (Signed)
Discharge Progress Report  Patient Details  Name: [name] [name] MRN: [id number] Date of Birth: Jan 26, 1941 Referring Provider:     Pulmonary Rehab Walk Test from 06/17/2017 in [place]  Referring Provider  Dr. Annamaria Boots       Number of Visits: 42  Reason for Discharge:  Patient reached a stable level of exercise.  Smoking History:  Social History   Tobacco Use  Smoking Status Former Smoker  . Packs/day: 2.00  . Years: 18.00  . Pack years: 36.00  . Types: Cigarettes  Smokeless Tobacco Never Used  Tobacco Comment   "quit smoking in 1971"    Diagnosis:  Dyspnea on exertion  ADL UCSD: Pulmonary Assessment Scores    Row Name 01/06/18 1631 01/12/18 1512       ADL UCSD   ADL Phase  Exit  Exit    SOB Score total  -  46      CAT Score   CAT Score  -  14      mMRC Score   mMRC Score  2  -       Initial Exercise Prescription:   Discharge Exercise Prescription (Final Exercise Prescription Changes): Exercise Prescription Changes - 12/30/17 1500      Response to Exercise   Blood Pressure (Admit)  96/54    Blood Pressure (Exercise)  94/54    Blood Pressure (Exit)  100/58    Heart Rate (Admit)  73 bpm    Heart Rate (Exercise)  113 bpm    Heart Rate (Exit)  70 bpm    Oxygen Saturation (Admit)  94 %    Oxygen Saturation (Exercise)  95 %    Oxygen Saturation (Exit)  91 %    Rating of Perceived Exertion (Exercise)  15    Perceived Dyspnea (Exercise)  3    Duration  Progress to 45 minutes of aerobic exercise without signs/symptoms of physical distress    Intensity  THRR unchanged      Progression   Progression  Continue to progress workloads to maintain intensity without signs/symptoms of physical distress.      Resistance Training   Training Prescription  Yes    Weight  orange bands    Reps  10-15    Time  10 Minutes      Interval Training   Interval Training  No      NuStep   Level  6    Minutes  17    METs  2.6       Arm Ergometer   Level  5    Minutes  17      Track   Laps  11    Minutes  17       Functional Capacity: 6 Minute Walk    Row Name 01/06/18 1629         6 Minute Walk   Phase  Discharge     Distance  1240 feet     Distance Feet Change  440 ft     Walk Time  6 minutes     # of Rest Breaks  0     MPH  2.34     METS  2.76     RPE  13     Perceived Dyspnea   1     Symptoms  No     Comments  None     Resting HR  74 bpm     Resting BP  114/64  Resting Oxygen Saturation   96 %     Exercise Oxygen Saturation  during 6 min walk  93 %     Max Ex. HR  118 bpm     Max Ex. BP  100/60     2 Minute Post BP  96/63       Interval HR   1 Minute HR  105     2 Minute HR  113     3 Minute HR  118     4 Minute HR  111     5 Minute HR  111     6 Minute HR  110     2 Minute Post HR  110     Interval Heart Rate?  Yes       Interval Oxygen   Interval Oxygen?  Yes     Baseline Oxygen Saturation %  96 %     1 Minute Oxygen Saturation %  94 %     1 Minute Liters of Oxygen  0 L     2 Minute Oxygen Saturation %  93 %     2 Minute Liters of Oxygen  0 L     3 Minute Oxygen Saturation %  93 %     3 Minute Liters of Oxygen  0 L     4 Minute Oxygen Saturation %  94 %     4 Minute Liters of Oxygen  0 L     5 Minute Oxygen Saturation %  93 %     5 Minute Liters of Oxygen  0 L     6 Minute Oxygen Saturation %  94 %     6 Minute Liters of Oxygen  0 L     2 Minute Post Oxygen Saturation %  95 %     2 Minute Post Liters of Oxygen  0 L        Psychological, QOL, Others - Outcomes: PHQ 2/9: Depression screen [facility] 2/9 01/15/2018 09/17/2017 06/09/2017 01/15/2017 07/15/2016  Decreased Interest _0 0 0  Down, Depressed, Hopeless 0 2 - 0 0  PHQ - 2 Score _1 0 0  Altered sleeping 0 0 0 - -  Tired, decreased energy 0 3 3 - -  Change in appetite 0 3 3 - -  Feeling bad or failure about yourself  0 2 3 - -  Trouble concentrating 0 3 0 - -  Moving slowly or fidgety/restless 0 0 0 - -   Suicidal thoughts 0 0 0 - -  PHQ-9 Score _2 - -  Difficult doing work/chores Not difficult at all Very difficult Not difficult at all - -    Quality of Life:   Personal Goals: Goals established at orientation with interventions provided to work toward goal.    Personal Goals Discharge: Goals and Risk Factor Review    Row Name 07/30/17 2104 08/22/17 1357 09/22/17 0834 10/14/17 1543 11/04/17 1550     Core Components/Risk Factors/Patient Goals Review   Personal Goals Review  Weight Management/Obesity;Improve shortness of breath with ADL's;Develop more efficient breathing techniques such as purse lipped breathing and diaphragmatic breathing and practicing self-pacing with activity.  Weight Management/Obesity;Improve shortness of breath with ADL's;Develop more efficient breathing techniques such as purse lipped breathing and diaphragmatic breathing and practicing self-pacing with activity.  -  Weight Management/Obesity;Improve shortness of breath with ADL's;Develop more efficient breathing techniques such as purse lipped breathing and diaphragmatic breathing and practicing  self-pacing with activity.  Weight Management/Obesity;Improve shortness of breath with ADL's;Develop more efficient breathing techniques such as purse lipped breathing and diaphragmatic breathing and practicing self-pacing with activity.   Review  patient doing well in rehab. tolerating workload increases and utilizing PLB for her shortness of breath  patient doing well in rehab. tolerating workload increases and utilizing PLB for her shortness of breath  patient doing well in rehab. tolerating workload increases and utilizing PLB for her shortness of breath. she is s/p cardioversion two weeks ago but is unable to tell a difference in how she feels.   Has been doing well in pulmonary rehab physically, but her daughter died 2 weeks ago and she has been absent.  She is nearing graduation.  She has lost 7 kg, is going through the  grief process and has her good and bad days and is in the process of possibly moving to an assisted living facility, she fell at home and broke her nose  and is recovering from this, otherwise is progressing well.   Expected Outcomes  see admission expected outcomes  see admission expected outcomes  see admission expected outcomes  see admission expected outcomes  to continue to progress as on admission outcomes   Row Name 11/21/17 0845 12/22/17 1022           Core Components/Risk Factors/Patient Goals Review   Personal Goals Review  Weight Management/Obesity;Improve shortness of breath with ADL's;Develop more efficient breathing techniques such as purse lipped breathing and diaphragmatic breathing and practicing self-pacing with activity.  Weight Management/Obesity;Improve shortness of breath with ADL's;Develop more efficient breathing techniques such as purse lipped breathing and diaphragmatic breathing and practicing self-pacing with activity.      Review  Maintaing the 7 kg weight loss, back exercising with [place], but had a viral infection the last week and was not able to exercise with [place].  continues to lose weight, has presently lost 10 kg, making progress with exercise, level 6 on nustep and walking 14 laps on the track.      Expected Outcomes  to continue to progress as on admission outcomes  see admission goals         Exercise Goals and Review:   Nutrition & Weight - Outcomes:    Nutrition:   Nutrition Discharge: Nutrition Assessments - 01/13/18 0853      Rate Your Plate Scores   Pre Score  46    Post Score  -- returned survey; incomplete       Education Questionnaire Score: Knowledge Questionnaire Score - 01/12/18 1512      Knowledge Questionnaire Score   Pre Score  12/13    Post Score  9/13       Goals reviewed with patient; copy given to patient.

## 2018-01-20 NOTE — Progress Notes (Addendum)
Subjective:   [name] [name] is a 77 y.o. female who presents for Medicare Annual (Subsequent) preventive examination.  Review of Systems:  No ROS.  Medicare Wellness Visit. Additional risk factors are reflected in the social history.  Cardiac Risk Factors include: advanced age (>55mn, >>6women);hypertension;dyslipidemia;family history of premature cardiovascular disease;obesity (BMI >30kg/m2);sedentary lifestyle   Sleep patterns: Sleeps 7 hours. Uses CPAP.  Home Safety/Smoke Alarms: Feels safe in home. Smoke alarms in place.  Living environment; residence and Firearm Safety: Lives with brother in single story home.  Seat Belt Safety/Bike Helmet: Wears seat belt.   Female:   [id number]      Mammo-03/22/2017, normal. Scheduled for 03/14/18.      Dexa scan-06/01/2016, normal        CCS-Colonoscopy 07/11/2016, normal.      Objective:     Vitals: BP 120/60 (BP Location: Left Arm, Patient Position: Sitting, Cuff Size: Large)   Pulse 75   Ht _0  (1.702 m)   Wt 248 lb (112.5 kg)   SpO2 98%   BMI 38.84 kg/m   Body mass index is 38.84 kg/m.  Advanced Directives 01/21/2018 09/02/2017 06/09/2017 01/15/2017 11/30/2015 08/21/2015 10/04/2014  Does Patient Have a Medical Advance Directive? _1  Yes Yes  Type of AParamed[place]of [place]Living will [place] will [place] will Living will;Healthcare Power of [place]Living will [place]Living will  Does patient want to make changes to medical advance directive? - - - - No - Patient declined - No - Patient declined  Copy of HRyein Chart? No - copy requested No - copy requested No - copy requested No - copy requested No - copy requested - No - copy requested    Tobacco Social History   Tobacco Use  Smoking Status Former Smoker  . Packs/day: 2.00  .  Years: 18.00  . Pack years: 36.00  . Types: Cigarettes  Smokeless Tobacco Never Used  Tobacco Comment   "quit smoking in 1971"     Counseling given: Not Answered Comment: "quit smoking in 1971"   Past Medical History:  Diagnosis Date  . Alpha-1-antitrypsin deficiency carrier ([place]   . Arthritis    "all over"  . Atrial fibrillation ([place]   . Back pain   . Complication of anesthesia    "I woke up during 2 different procedures" (10/04/2014)  . Frequent UTI   . GERD (gastroesophageal reflux disease)   . Hypertension   . MVP (mitral valve prolapse)   . Obesity   . Osteoarthritis   . Presence of permanent cardiac pacemaker   . PVC's (premature ventricular contractions)   . Shortness of breath   . Sinus arrest 10/2014   s/p Medtronic Advisa model [id number] number [id number]  . Sleep apnea   . Stroke ([facility] 01/2014   denies deficits on 10/04/2014  . Tachy-brady syndrome ([place] 10/2014  . TIA (transient ischemic attack)    Past Surgical History:  Procedure Laterality Date  . CARDIOVERSION N/A 09/02/2017   Procedure: CARDIOVERSION;  Surgeon: CSanda Klein MD;  Location: [facility] ENDOSCOPY;  Service: Cardiovascular;  Laterality: N/A;  . EXCISION VAGINAL CYST     benign nodules  . INSERT / REPLACE / REMOVE PACEMAKER  10/04/2014   Medtronic Advisa model [id number] number [id number]  . LOOP RECORDER EXPLANT N/A 10/04/2014   Procedure: LOOP RECORDER EXPLANT;  Surgeon:  Sanda Klein, MD;  Location: [place] CATH LAB;  Service: Cardiovascular;  Laterality: N/A;  . LOOP RECORDER IMPLANT N/A 04/19/2014   Procedure: LOOP RECORDER IMPLANT;  Surgeon: Sanda Klein, MD;  Location: [place] CATH LAB;  Service: Cardiovascular;  Laterality: N/A;  . NM MYOCAR PERF WALL MOTION  12/18/2007   normal  . PERMANENT PACEMAKER INSERTION N/A 10/04/2014   Procedure: PERMANENT PACEMAKER INSERTION;  Surgeon: Sanda Klein, MD;  Location: [place] CATH LAB;  Service: Cardiovascular;  Laterality: N/A;  . TUBAL LIGATION    .  US ECHOCARDIOGRAPHY  05/15/2010   LA mildly dilated,mild mitral annular ca+, AOV mildly sclerotic, mild asymmetric LVH   Family History  Problem Relation Age of Onset  . Heart attack Mother   . Sudden death Mother   . Depression Mother   . Stroke Father   . Heart failure Father   . Depression Father   . Heart failure Brother   . Stroke Brother   . Alpha-1 antitrypsin deficiency Daughter    Social History   Socioeconomic History  . Marital status: Divorced    Spouse name: None  . Number of children: 3  . Years of education: college  . Highest education level: None  Social Needs  . Financial resource strain: None  . Food insecurity - worry: None  . Food insecurity - inability: None  . Transportation needs - medical: None  . Transportation needs - non-medical: None  Occupational History  . Occupation: Retired Engineer, site  Tobacco Use  . Smoking status: Former Smoker    Packs/day: 2.00    Years: 18.00    Pack years: 36.00    Types: Cigarettes  . Smokeless tobacco: Never Used  . Tobacco comment: "quit smoking in 1971"  Substance and Sexual Activity  . Alcohol use: Yes    Alcohol/week: 0.0 oz    Comment: 10/04/2014 "might have a drink a couple times/yr"  . Drug use: No  . Sexual activity: No  Other Topics Concern  . None  Social History Narrative   Patient lives alone in one story house.   Patient is right handed.   Patient has a college degree.   Patient is retired.    Outpatient Encounter Medications as of 01/21/2018  Medication Sig  . apixaban (ELIQUIS) 5 MG TABS tablet Take 1 tablet (5 mg total) by mouth 2 (two) times daily.  . Biotin 5000 MCG CAPS Take 5,000 mcg by mouth daily.   . Calcium Carbonate-Vit D-Min (CALCIUM 1200 PO) Take 2,400 mg by mouth daily.   . cholecalciferol (VITAMIN D) 1000 UNITS tablet Take 1,000 Units by mouth daily.   Marland Kitchen Co-Enzyme Q10 100 MG CAPS Take 100 mg by mouth daily.   . diazepam (VALIUM) 5 MG tablet TAKE 1 TABLET BY MOUTH  ONCE DAILY AS NEEDED FOR ANXIETY  . fluticasone (FLONASE) 50 MCG/ACT nasal spray Place 2 sprays into both nostrils daily.  . folic acid (FOLVITE) 500 MCG tablet Take 400 mcg by mouth daily.  Marland Kitchen glucosamine-chondroitin 500-400 MG tablet Take 1 tablet by mouth daily.   Marland Kitchen loratadine (KLS ALLERCLEAR) 10 MG tablet Take 10 mg by mouth daily.  Marland Kitchen MAGNESIUM CITRATE PO Take 250 mg by mouth daily. 1 tab daily  . metoprolol tartrate (LOPRESSOR) 25 MG tablet Take 0.5 tablets (12.5 mg total) by mouth 2 (two) times daily.  . nitroGLYCERIN (NITROSTAT) 0.4 MG SL tablet Place 0.4 mg under the tongue every 5 (five) minutes as needed for chest pain.  . pantoprazole (  PROTONIX) 40 MG tablet TAKE 1 TABLET BY MOUTH DAILY  . Resveratrol 250 MG CAPS Take 250 mg by mouth daily.  . sertraline (ZOLOFT) 100 MG tablet TAKE 1 TABLET BY  MOUTH DAILY  . Simethicone (GAS-X PO) Take 2 tablets by mouth daily as needed (bloating).   No facility-administered encounter medications on file as of 01/21/2018.     Activities of Daily Living In your present state of health, do you have any difficulty performing the following activities: 01/21/2018 01/15/2018  Hearing? N N  Vision? N N  Difficulty concentrating or making decisions? N N  Walking or climbing stairs? Y N  Comment SOB with walking upstairs -  Dressing or bathing? N N  Doing errands, shopping? N N  Preparing Food and eating ? N -  Using the Toilet? N -  In the past six months, have you accidently leaked urine? N -  Do you have problems with loss of bowel control? N -  Managing your Medications? N -  Managing your Finances? N -  Housekeeping or managing your Housekeeping? N -  Some recent data might be hidden    Patient Care Team: Midge Minium, MD as PCP - General (Family Medicine) Maisie Fus, MD as Consulting Physician (Obstetrics and Gynecology) Richmond Campbell, MD as Consulting Physician (Gastroenterology) Croitoru, Dani Gobble, MD as Consulting Physician  (Cardiology) Kathie Rhodes, MD as Consulting Physician (Urology) Deneise Lever, MD as Consulting Physician (Pulmonary Disease) Danella Sensing, MD as Consulting Physician (Dermatology) Starlyn Skeans, MD as Consulting Physician (Family Medicine)    Assessment:   This is a routine wellness examination for Chelise.  Exercise Activities and Dietary recommendations Current Exercise Habits: The patient does not participate in regular exercise at present(Cardiac Rehab recently), Exercise limited by: cardiac condition(s);respiratory conditions(s)   Diet (meal preparation, eat out, water intake, caffeinated beverages, dairy products, fruits and vegetables): Drinks water and hot tea.   Breakfast: [place] bacon/sausage, egg, toast, coffee;cereal with fruit Lunch: sandwich; frozen meal Dinner: protein and veggies     Snacks on yogurt/fruit nightly.   Currently followed by Dr. Leafy Ro  Goals    . Patient Stated     Lose weight by increasing activity.     . Weight (lb) < 240 lb (108.9 kg)     Would like to lose weight by increasing activity and making healthier food choices.        Fall Risk Fall Risk  01/21/2018 01/15/2018 06/09/2017 01/15/2017 01/15/2017  Falls in the past year? Yes No No No No  Number falls in past yr: 1 - - - -  Injury with Fall? Yes - - - -  Risk Factor Category  High Fall Risk - - - -  Follow up Falls prevention discussed - - - -     Depression Screen PHQ 2/9 Scores 01/21/2018 01/15/2018 09/17/2017 06/09/2017  PHQ - 2 Score _0 PHQ- 9 Score _1 Cognitive Function MMSE - Mini Mental State Exam 01/21/2018  Orientation to time 5  Orientation to Place 5  Registration 3  Attention/ Calculation 5  Recall 3  Language- name 2 objects 2  Language- repeat 1  Language- follow 3 step command 3  Language- read & follow direction 1  Write a sentence 1  Copy design 1  Total score 30        Immunization History  Administered Date(s) Administered  .  Influenza Whole 09/23/2008  . Influenza-Unspecified  09/17/2014, 09/09/2016, 09/04/2017  . Pneumococcal Conjugate-13 10/16/2015  . Pneumococcal-Unspecified 10/01/2014    Screening Tests Health Maintenance  Topic Date Due  . TETANUS/TDAP  01/15/2019 (Originally 10/21/1960)  . MAMMOGRAM  03/22/2018  . INFLUENZA VACCINE  Completed  . DEXA SCAN  Completed  . PNA vac Low Risk Adult  Completed       Plan:    Bring a copy of your living will and/or healthcare power of attorney to your next office visit.  Continue doing brain stimulating activities (puzzles, reading, adult coloring books, staying active) to keep memory sharp.   I have personally reviewed and noted the following in the patient's chart:   . Medical and social history . Use of alcohol, tobacco or illicit drugs  . Current medications and supplements . Functional ability and status . Nutritional status . Physical activity . Advanced directives . List of other physicians . Hospitalizations, surgeries, and ER visits in previous 12 months . Vitals . Screenings to include cognitive, depression, and falls . Referrals and appointments  In addition, I have reviewed and discussed with patient certain preventive protocols, quality metrics, and best practice recommendations. A written personalized care plan for preventive services as well as general preventive health recommendations were provided to patient.     Gerilyn Nestle, RN  01/21/2018  PCP Notes: -PHQ9=12. Daughter recently (2017/10/17) passed away.  -Offered f/u with PCP (overdue by 6 months), plans to call back to schedule.   Reviewed documentation provided by RN and agree w/ above.  Pt is on Zoloft 194m daily- we can increase her dose to help with her grief but this would need to be done at an appt.  KAnnye Asa MD

## 2018-01-21 ENCOUNTER — Ambulatory Visit (INDEPENDENT_AMBULATORY_CARE_PROVIDER_SITE_OTHER): Payer: Medicare Other

## 2018-01-21 ENCOUNTER — Other Ambulatory Visit: Payer: Self-pay

## 2018-01-21 VITALS — BP 120/60 | HR 75 | Ht 67.0 in | Wt 248.0 lb

## 2018-01-21 DIAGNOSIS — Z0000 Encounter for general adult medical examination without abnormal findings: Secondary | ICD-10-CM

## 2018-01-21 NOTE — Patient Instructions (Addendum)
Bring a copy of your living will and/or healthcare power of attorney to your next office visit.  Continue doing brain stimulating activities (puzzles, reading, adult coloring books, staying active) to keep memory sharp.    Fall Prevention in the Home Falls can cause injuries. They can happen to people of all ages. There are many things you can do to make your home safe and to help prevent falls. What can I do on the outside of my home?  Regularly fix the edges of walkways and driveways and fix any cracks.  Remove anything that might make you trip as you walk through a door, such as a raised step or threshold.  Trim any bushes or trees on the path to your home.  Use bright outdoor lighting.  Clear any walking paths of anything that might make someone trip, such as rocks or tools.  Regularly check to see if handrails are loose or broken. Make sure that both sides of any steps have handrails.  Any raised decks and porches should have guardrails on the edges.  Have any leaves, snow, or ice cleared regularly.  Use sand or salt on walking paths during winter.  Clean up any spills in your garage right away. This includes oil or grease spills. What can I do in the bathroom?  Use night lights.  Install grab bars by the toilet and in the tub and shower. Do not use towel bars as grab bars.  Use non-skid mats or decals in the tub or shower.  If you need to sit down in the shower, use a plastic, non-slip stool.  Keep the floor dry. Clean up any water that spills on the floor as soon as it happens.  Remove soap buildup in the tub or shower regularly.  Attach bath mats securely with double-sided non-slip rug tape.  Do not have throw rugs and other things on the floor that can make you trip. What can I do in the bedroom?  Use night lights.  Make sure that you have a light by your bed that is easy to reach.  Do not use any sheets or blankets that are too big for your bed. They  should not hang down onto the floor.  Have a firm chair that has side arms. You can use this for support while you get dressed.  Do not have throw rugs and other things on the floor that can make you trip. What can I do in the kitchen?  Clean up any spills right away.  Avoid walking on wet floors.  Keep items that you use a lot in easy-to-reach places.  If you need to reach something above you, use a strong step stool that has a grab bar.  Keep electrical cords out of the way.  Do not use floor polish or wax that makes floors slippery. If you must use wax, use non-skid floor wax.  Do not have throw rugs and other things on the floor that can make you trip. What can I do with my stairs?  Do not leave any items on the stairs.  Make sure that there are handrails on both sides of the stairs and use them. Fix handrails that are broken or loose. Make sure that handrails are as long as the stairways.  Check any carpeting to make sure that it is firmly attached to the stairs. Fix any carpet that is loose or worn.  Avoid having throw rugs at the top or bottom of the stairs. If  you do have throw rugs, attach them to the floor with carpet tape.  Make sure that you have a light switch at the top of the stairs and the bottom of the stairs. If you do not have them, ask someone to add them for you. What else can I do to help prevent falls?  Wear shoes that: ? Do not have high heels. ? Have rubber bottoms. ? Are comfortable and fit you well. ? Are closed at the toe. Do not wear sandals.  If you use a stepladder: ? Make sure that it is fully opened. Do not climb a closed stepladder. ? Make sure that both sides of the stepladder are locked into place. ? Ask someone to hold it for you, if possible.  Clearly mark and make sure that you can see: ? Any grab bars or handrails. ? First and last steps. ? Where the edge of each step is.  Use tools that help you move around (mobility aids) if  they are needed. These include: ? Canes. ? Walkers. ? Scooters. ? Crutches.  Turn on the lights when you go into a dark area. Replace any light bulbs as soon as they burn out.  Set up your furniture so you have a clear path. Avoid moving your furniture around.  If any of your floors are uneven, fix them.  If there are any pets around you, be aware of where they are.  Review your medicines with your doctor. Some medicines can make you feel dizzy. This can increase your chance of falling. Ask your doctor what other things that you can do to help prevent falls. This information is not intended to replace advice given to you by your health care provider. Make sure you discuss any questions you have with your health care provider. Document Released: 09/14/2009 Document Revised: 04/25/2016 Document Reviewed: 12/23/2014 Elsevier Interactive Patient Education  2018 [place] Maintenance, Female Adopting a healthy lifestyle and getting preventive care can go a long way to promote health and wellness. Talk with your health care provider about what schedule of regular examinations is right for you. This is a good chance for you to check in with your provider about disease prevention and staying healthy. In between checkups, there are plenty of things you can do on your own. Experts have done a lot of research about which lifestyle changes and preventive measures are most likely to keep you healthy. Ask your health care provider for more information. Weight and diet Eat a healthy diet  Be sure to include plenty of vegetables, fruits, low-fat dairy products, and lean protein.  Do not eat a lot of foods high in solid fats, added sugars, or salt.  Get regular exercise. This is one of the most important things you can do for your health. ? Most adults should exercise for at least 150 minutes each week. The exercise should increase your heart rate and make you sweat (moderate-intensity  exercise). ? Most adults should also do strengthening exercises at least twice a week. This is in addition to the moderate-intensity exercise.  Maintain a healthy weight  Body mass index (BMI) is a measurement that can be used to identify possible weight problems. It estimates body fat based on height and weight. Your health care provider can help determine your BMI and help you achieve or maintain a healthy weight.  For females 24 years of age and older: ? A BMI below 18.5 is considered underweight. ? A BMI of  18.5 to 24.9 is normal. ? A BMI of 25 to 29.9 is considered overweight. ? A BMI of 30 and above is considered obese.  Watch levels of cholesterol and blood lipids  You should start having your blood tested for lipids and cholesterol at 78 years of age, then have this test every 5 years.  You may need to have your cholesterol levels checked more often if: ? Your lipid or cholesterol levels are high. ? You are older than 78 years of age. ? You are at high risk for heart disease.  Cancer screening Lung Cancer  Lung cancer screening is recommended for adults 82-57 years old who are at high risk for lung cancer because of a history of smoking.  A yearly low-dose CT scan of the lungs is recommended for people who: ? Currently smoke. ? Have quit within the past 15 years. ? Have at least a 30-pack-year history of smoking. A pack year is smoking an average of one pack of cigarettes a day for 1 year.  Yearly screening should continue until it has been 15 years since you quit.  Yearly screening should stop if you develop a health problem that would prevent you from having lung cancer treatment.  Breast Cancer  Practice breast self-awareness. This means understanding how your breasts normally appear and feel.  It also means doing regular breast self-exams. Let your health care provider know about any changes, no matter how small.  If you are in your 20s or 30s, you should have a  clinical breast exam (CBE) by a health care provider every 1-3 years as part of a regular health exam.  If you are 70 or older, have a CBE every year. Also consider having a breast X-ray (mammogram) every year.  If you have a family history of breast cancer, talk to your health care provider about genetic screening.  If you are at high risk for breast cancer, talk to your health care provider about having an MRI and a mammogram every year.  Breast cancer gene (BRCA) assessment is recommended for women who have family members with BRCA-related cancers. BRCA-related cancers include: ? Breast. ? Ovarian. ? Tubal. ? Peritoneal cancers.  Results of the assessment will determine the need for genetic counseling and BRCA1 and BRCA2 testing.  Cervical Cancer Your health care provider may recommend that you be screened regularly for cancer of the pelvic organs (ovaries, uterus, and vagina). This screening involves a pelvic examination, including checking for microscopic changes to the surface of your cervix (Pap test). You may be encouraged to have this screening done every 3 years, beginning at age 8.  For women ages 48-65, health care providers may recommend pelvic exams and Pap testing every 3 years, or they may recommend the Pap and pelvic exam, combined with testing for human papilloma virus (HPV), every 5 years. Some types of HPV increase your risk of cervical cancer. Testing for HPV may also be done on women of any age with unclear Pap test results.  Other health care providers may not recommend any screening for nonpregnant women who are considered low risk for pelvic cancer and who do not have symptoms. Ask your health care provider if a screening pelvic exam is right for you.  If you have had past treatment for cervical cancer or a condition that could lead to cancer, you need Pap tests and screening for cancer for at least 20 years after your treatment. If Pap tests have been discontinued,  your  risk factors (such as having a new sexual partner) need to be reassessed to determine if screening should resume. Some women have medical problems that increase the chance of getting cervical cancer. In these cases, your health care provider may recommend more frequent screening and Pap tests.  Colorectal Cancer  This type of cancer can be detected and often prevented.  Routine colorectal cancer screening usually begins at 78 years of age and continues through 78 years of age.  Your health care provider may recommend screening at an earlier age if you have risk factors for colon cancer.  Your health care provider may also recommend using home test kits to check for hidden blood in the stool.  A small camera at the end of a tube can be used to examine your colon directly (sigmoidoscopy or colonoscopy). This is done to check for the earliest forms of colorectal cancer.  Routine screening usually begins at age 64.  Direct examination of the colon should be repeated every 5-10 years through 78 years of age. However, you may need to be screened more often if early forms of precancerous polyps or small growths are found.  Skin Cancer  Check your skin from head to toe regularly.  Tell your health care provider about any new moles or changes in moles, especially if there is a change in a mole's shape or color.  Also tell your health care provider if you have a mole that is larger than the size of a pencil eraser.  Always use sunscreen. Apply sunscreen liberally and repeatedly throughout the day.  Protect yourself by wearing long sleeves, pants, a wide-brimmed hat, and sunglasses whenever you are outside.  Heart disease, diabetes, and high blood pressure  High blood pressure causes heart disease and increases the risk of stroke. High blood pressure is more likely to develop in: ? People who have blood pressure in the high end of the normal range (130-139/85-89 mm Hg). ? People who are  overweight or obese. ? People who are African American.  If you are 54-25 years of age, have your blood pressure checked every 3-5 years. If you are 32 years of age or older, have your blood pressure checked every year. You should have your blood pressure measured twice-once when you are at a hospital or clinic, and once when you are not at a hospital or clinic. Record the average of the two measurements. To check your blood pressure when you are not at a hospital or clinic, you can use: ? An automated blood pressure machine at a pharmacy. ? A home blood pressure monitor.  If you are between 73 years and 63 years old, ask your health care provider if you should take aspirin to prevent strokes.  Have regular diabetes screenings. This involves taking a blood sample to check your fasting blood sugar level. ? If you are at a normal weight and have a low risk for diabetes, have this test once every three years after 78 years of age. ? If you are overweight and have a high risk for diabetes, consider being tested at a younger age or more often. Preventing infection Hepatitis B  If you have a higher risk for hepatitis B, you should be screened for this virus. You are considered at high risk for hepatitis B if: ? You were born in a country where hepatitis B is common. Ask your health care provider which countries are considered high risk. ? Your parents were born in a high-risk country,  and you have not been immunized against hepatitis B (hepatitis B vaccine). ? You have HIV or AIDS. ? You use needles to inject street drugs. ? You live with someone who has hepatitis B. ? You have had sex with someone who has hepatitis B. ? You get hemodialysis treatment. ? You take certain medicines for conditions, including cancer, organ transplantation, and autoimmune conditions.  Hepatitis C  Blood testing is recommended for: ? Everyone born from [birth year] through 1966. ? Anyone with known risk factors for  hepatitis C.  Sexually transmitted infections (STIs)  You should be screened for sexually transmitted infections (STIs) including gonorrhea and chlamydia if: ? You are sexually active and are younger than 78 years of age. ? You are older than 78 years of age and your health care provider tells you that you are at risk for this type of infection. ? Your sexual activity has changed since you were last screened and you are at an increased risk for chlamydia or gonorrhea. Ask your health care provider if you are at risk.  If you do not have HIV, but are at risk, it may be recommended that you take a prescription medicine daily to prevent HIV infection. This is called pre-exposure prophylaxis (PrEP). You are considered at risk if: ? You are sexually active and do not regularly use condoms or know the HIV status of your partner(s). ? You take drugs by injection. ? You are sexually active with a partner who has HIV.  Talk with your health care provider about whether you are at high risk of being infected with HIV. If you choose to begin PrEP, you should first be tested for HIV. You should then be tested every 3 months for as long as you are taking PrEP. Pregnancy  If you are premenopausal and you may become pregnant, ask your health care provider about preconception counseling.  If you may become pregnant, take 400 to 800 micrograms (mcg) of folic acid every day.  If you want to prevent pregnancy, talk to your health care provider about birth control (contraception). Osteoporosis and menopause  Osteoporosis is a disease in which the bones lose minerals and strength with aging. This can result in serious bone fractures. Your risk for osteoporosis can be identified using a bone density scan.  If you are 51 years of age or older, or if you are at risk for osteoporosis and fractures, ask your health care provider if you should be screened.  Ask your health care provider whether you should take a  calcium or vitamin D supplement to lower your risk for osteoporosis.  Menopause may have certain physical symptoms and risks.  Hormone replacement therapy may reduce some of these symptoms and risks. Talk to your health care provider about whether hormone replacement therapy is right for you. Follow these instructions at home:  Schedule regular health, dental, and eye exams.  Stay current with your immunizations.  Do not use any tobacco products including cigarettes, chewing tobacco, or electronic cigarettes.  If you are pregnant, do not drink alcohol.  If you are breastfeeding, limit how much and how often you drink alcohol.  Limit alcohol intake to no more than 1 drink per day for nonpregnant women. One drink equals 12 ounces of beer, 5 ounces of wine, or 1 ounces of hard liquor.  Do not use street drugs.  Do not share needles.  Ask your health care provider for help if you need support or information about quitting drugs.  Tell your health care provider if you often feel depressed.  Tell your health care provider if you have ever been abused or do not feel safe at home. This information is not intended to replace advice given to you by your health care provider. Make sure you discuss any questions you have with your health care provider. Document Released: 06/03/2011 Document Revised: 04/25/2016 Document Reviewed: 08/22/2015 Elsevier Interactive Patient Education  [employer].

## 2018-01-25 LAB — CUP PACEART REMOTE DEVICE CHECK
Brady Statistic AP VP Percent: 0.05 %
Brady Statistic AP VS Percent: 97.59 %
Brady Statistic AS VP Percent: 0 %
Brady Statistic RV Percent Paced: 0.05 %
Date Time Interrogation Session: 20190204205141
Implantable Lead Location: 753859
Implantable Lead Model: 5076
Lead Channel Impedance Value: 532 Ohm
Lead Channel Sensing Intrinsic Amplitude: 6.75 mV
Lead Channel Sensing Intrinsic Amplitude: 6.75 mV
Lead Channel Setting Pacing Amplitude: 2 V
Lead Channel Setting Pacing Pulse Width: 0.4 ms
Lead Channel Setting Sensing Sensitivity: 2.8 mV
MDC IDC LEAD IMPLANT DT: 20151103
MDC IDC LEAD IMPLANT DT: 20151103
MDC IDC LEAD LOCATION: 753860
MDC IDC MSMT BATTERY REMAINING LONGEVITY: 86 mo
MDC IDC MSMT BATTERY VOLTAGE: 3.01 V
MDC IDC MSMT LEADCHNL RA IMPEDANCE VALUE: 456 Ohm
MDC IDC MSMT LEADCHNL RA IMPEDANCE VALUE: 532 Ohm
MDC IDC MSMT LEADCHNL RA PACING THRESHOLD AMPLITUDE: 0.625 V
MDC IDC MSMT LEADCHNL RA PACING THRESHOLD PULSEWIDTH: 0.4 ms
MDC IDC MSMT LEADCHNL RA SENSING INTR AMPL: 5.375 mV
MDC IDC MSMT LEADCHNL RA SENSING INTR AMPL: 5.375 mV
MDC IDC MSMT LEADCHNL RV IMPEDANCE VALUE: 494 Ohm
MDC IDC MSMT LEADCHNL RV PACING THRESHOLD AMPLITUDE: 0.5 V
MDC IDC MSMT LEADCHNL RV PACING THRESHOLD PULSEWIDTH: 0.4 ms
MDC IDC PG IMPLANT DT: 20151103
MDC IDC SET LEADCHNL RA PACING AMPLITUDE: 1.5 V
MDC IDC STAT BRADY AS VS PERCENT: 2.36 %
MDC IDC STAT BRADY RA PERCENT PACED: 97.44 %

## 2018-01-26 ENCOUNTER — Ambulatory Visit (INDEPENDENT_AMBULATORY_CARE_PROVIDER_SITE_OTHER): Payer: Medicare Other | Admitting: Family Medicine

## 2018-01-26 VITALS — BP 99/66 | HR 65 | Temp 98.0°F | Ht 67.0 in | Wt 243.0 lb

## 2018-01-26 DIAGNOSIS — N189 Chronic kidney disease, unspecified: Secondary | ICD-10-CM | POA: Diagnosis not present

## 2018-01-26 DIAGNOSIS — Z6838 Body mass index (BMI) 38.0-38.9, adult: Secondary | ICD-10-CM

## 2018-01-27 NOTE — Progress Notes (Signed)
Office: [phone]  /  Fax: [phone]   HPI:   Chief Complaint: OBESITY [name] [name] is here to discuss her progress with her obesity treatment plan. She is on the  follow the Category 2 plan and is following her eating plan approximately 65 % of the time. She states she is exercising 0 minutes 0 times per week. [name] [name] did well with weight loss. Hunger is controlled and she is working on eating all her food. [name] [name] is not skipping meals and she is not drinking much water (or anything else). Her weight is 243 lb (110.2 kg) today and has had a weight loss of 5 pounds over a period of 2 to 3 weeks since her last visit. She has lost 24 lbs since starting treatment with [place].  Chronic Renal Insufficiency [name] [name] has a GFR of 47 and she notes she is not drinking as much water and denies thirst.  ALLERGIES: Allergies  Allergen Reactions  . Ancef [Cefazolin] Hives and Itching  . Pseudoephedrine Hcl     Other reaction(s): palpitations (moderate)  . Caffeine Palpitations    MEDICATIONS: Current Outpatient Medications on File Prior to Visit  Medication Sig Dispense Refill  . apixaban (ELIQUIS) 5 MG TABS tablet Take 1 tablet (5 mg total) by mouth 2 (two) times daily. 28 tablet 0  . Biotin 5000 MCG CAPS Take 5,000 mcg by mouth daily.     . Calcium Carbonate-Vit D-Min (CALCIUM 1200 PO) Take 2,400 mg by mouth daily.     . cholecalciferol (VITAMIN D) 1000 UNITS tablet Take 1,000 Units by mouth daily.     Marland Kitchen Co-Enzyme Q10 100 MG CAPS Take 100 mg by mouth daily.     . diazepam (VALIUM) 5 MG tablet TAKE 1 TABLET BY MOUTH ONCE DAILY AS NEEDED FOR ANXIETY 30 tablet 1  . fluticasone (FLONASE) 50 MCG/ACT nasal spray Place 2 sprays into both nostrils daily. 16 g 6  . folic acid (FOLVITE) 381 MCG tablet Take 400 mcg by mouth daily.    Marland Kitchen glucosamine-chondroitin 500-400 MG tablet Take 1 tablet by mouth daily.     Marland Kitchen loratadine (KLS ALLERCLEAR) 10 MG tablet Take 10 mg by mouth daily.    Marland Kitchen MAGNESIUM CITRATE PO  Take 250 mg by mouth daily. 1 tab daily    . metoprolol tartrate (LOPRESSOR) 25 MG tablet Take 0.5 tablets (12.5 mg total) by mouth 2 (two) times daily. 90 tablet 3  . nitroGLYCERIN (NITROSTAT) 0.4 MG SL tablet Place 0.4 mg under the tongue every 5 (five) minutes as needed for chest pain.    . pantoprazole (PROTONIX) 40 MG tablet TAKE 1 TABLET BY MOUTH DAILY 90 tablet 1  . Resveratrol 250 MG CAPS Take 250 mg by mouth daily.    . sertraline (ZOLOFT) 100 MG tablet TAKE 1 TABLET BY  MOUTH DAILY 90 tablet 0  . Simethicone (GAS-X PO) Take 2 tablets by mouth daily as needed (bloating).     No current facility-administered medications on file prior to visit.     PAST MEDICAL HISTORY: Past Medical History:  Diagnosis Date  . Alpha-1-antitrypsin deficiency carrier ([name] [name])   . Arthritis    "all over"  . Atrial fibrillation ([place])   . Back pain   . Complication of anesthesia    "I woke up during 2 different procedures" (10/04/2014)  . Frequent UTI   . GERD (gastroesophageal reflux disease)   . Hypertension   . MVP (mitral valve prolapse)   . Obesity   . Osteoarthritis   .  Presence of permanent cardiac pacemaker   . PVC's (premature ventricular contractions)   . Shortness of breath   . 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 Sleep apnea   . Stroke [facility]) 01/2014   denies deficits on 10/04/2014  . Tachy-brady syndrome ([place]) 10/2014  . TIA (transient ischemic attack)     PAST SURGICAL HISTORY: Past Surgical History:  Procedure Laterality Date  . CARDIOVERSION N/A 09/02/2017   Procedure: CARDIOVERSION;  Surgeon: Sanda Klein, MD;  [facility];  Service: Cardiovascular;  Laterality: N/A;  . EXCISION VAGINAL CYST     benign nodules  . INSERT / REPLACE / REMOVE PACEMAKER  10/04/2014   Medtronic Advisa model [id number] serial number [id number]  . LOOP RECORDER EXPLANT N/A 10/04/2014   Procedure: LOOP RECORDER EXPLANT;  Surgeon: Sanda Klein, MD;  Location: [place] CATH LAB;  Service: Cardiovascular;  Laterality: N/A;  . LOOP RECORDER IMPLANT N/A 04/19/2014   Procedure: LOOP RECORDER IMPLANT;  Surgeon: Sanda Klein, MD;  Location: [place] CATH LAB;  Service: Cardiovascular;  Laterality: N/A;  . NM MYOCAR PERF WALL MOTION  12/18/2007   normal  . PERMANENT PACEMAKER INSERTION N/A 10/04/2014   Procedure: PERMANENT PACEMAKER INSERTION;  Surgeon: Sanda Klein, MD;  Location: [place] CATH LAB;  Service: Cardiovascular;  Laterality: N/A;  . TUBAL LIGATION    . US ECHOCARDIOGRAPHY  05/15/2010   LA mildly dilated,mild mitral annular ca+, AOV mildly sclerotic, mild asymmetric LVH    SOCIAL HISTORY: Social History   Tobacco Use  . Smoking status: Former Smoker    Packs/day: 2.00    Years: 18.00    Pack years: 36.00    Types: Cigarettes  . Smokeless tobacco: Never Used  . Tobacco comment: "quit smoking in 1971"  Substance Use Topics  . Alcohol use: Yes    Alcohol/week: 0.0 oz    Comment: 10/04/2014 "might have a drink a couple times/yr"  . Drug use: No    FAMILY HISTORY: Family History  Problem Relation Age of Onset  . Heart attack Mother   . Sudden death Mother   . Depression Mother   . Stroke Father   . Heart failure Father   . Depression Father   . Heart failure Brother   . Stroke Brother   . Alpha-1 antitrypsin deficiency Daughter     ROS: Review of Systems  Constitutional: Positive for weight loss.    PHYSICAL EXAM: Blood pressure 99/66, pulse 65, temperature 98 F (36.7 C), temperature source Oral, height 5' 7" (1.702 m), weight 243 lb (110.2 kg), SpO2 97 %. Body mass index is 38.06 kg/m. Physical Exam  Constitutional: She is oriented to person, place, and time. She appears well-developed and well-nourished.  Cardiovascular: Normal rate.  Pulmonary/Chest: Effort normal.  Musculoskeletal: Normal range of motion.  Neurological: She is oriented to person, place, and time.  Skin: Skin is warm and dry.    Psychiatric: She has a normal mood and affect. Her behavior is normal.  Vitals reviewed.   RECENT LABS AND TESTS: BMET    Component Value Date/Time   NA 142 12/24/2017 1140   K 4.7 12/24/2017 1140   CL 104 12/24/2017 1140   CO2 24 12/24/2017 1140   GLUCOSE 121 (H) 12/24/2017 1140   GLUCOSE 92 01/15/2017 1417   BUN 17 12/24/2017 1140   CREATININE 1.12 (H) 12/24/2017 1140   CREATININE 0.86 09/28/2014 1557   CALCIUM 9.5 12/24/2017 1140   GFRNONAA 47 (L)  12/24/2017 1140   GFRNONAA 56 (L) 06/06/2014 1653   GFRAA 55 (L) 12/24/2017 1140   GFRAA 65 06/06/2014 1653   Lab Results  Component Value Date   HGBA1C 5.8 (H) 12/24/2017   HGBA1C 6.2 (H) 09/17/2017   HGBA1C 5.9 (H) 03/09/2014   Lab Results  Component Value Date   INSULIN 19.4 12/24/2017   INSULIN 47.7 (H) 09/17/2017   CBC    Component Value Date/Time   WBC 8.5 09/17/2017 1131   WBC 9.1 02/24/2017 1555   RBC 5.22 09/17/2017 1131   RBC 5.09 02/24/2017 1555   HGB 14.9 09/17/2017 1131   HCT 45.1 09/17/2017 1131   PLT 258 08/27/2017 1509   MCV 86 09/17/2017 1131   MCH 28.5 09/17/2017 1131   MCH 29.0 09/28/2014 1557   MCHC 33.0 09/17/2017 1131   MCHC 33.9 02/24/2017 1555   RDW 13.5 09/17/2017 1131   LYMPHSABS 2.0 09/17/2017 1131   MONOABS 1.3 (H) 02/24/2017 1555   EOSABS 0.2 09/17/2017 1131   BASOSABS 0.0 09/17/2017 1131   Iron/TIBC/Ferritin/ %Sat No results found for: IRON, TIBC, FERRITIN, IRONPCTSAT Lipid Panel     Component Value Date/Time   CHOL 164 09/17/2017 1131   TRIG 117 09/17/2017 1131   HDL 47 09/17/2017 1131   CHOLHDL 3 01/15/2017 1417   VLDL 33.6 01/15/2017 1417   LDLCALC 94 09/17/2017 1131   LDLDIRECT 81.0 07/15/2016 1419   Hepatic Function Panel     Component Value Date/Time   PROT 7.6 12/24/2017 1140   ALBUMIN 4.2 12/24/2017 1140   AST 27 12/24/2017 1140   ALT 17 12/24/2017 1140   ALKPHOS 77 12/24/2017 1140   BILITOT 0.5 12/24/2017 1140   BILIDIR 0.1 01/15/2017 1417       Component Value Date/Time   TSH 2.040 09/17/2017 1131   TSH 2.70 07/15/2016 1419   TSH 1.529 06/06/2014 1608    ASSESSMENT AND PLAN: Chronic renal impairment, unspecified CKD stage  Class 2 severe obesity with serious comorbidity and body mass index (BMI) of 38.0 to 38.9 in adult, unspecified obesity type (HCC)  PLAN:  Chronic Renal Insufficiency [name] [name] was educated on decreased thirst response with age and to work on increasing water to at least 80 ounces per day. [name] [name] agreed to try.  We spent > than 50% of the 15 minute visit on the counseling as documented in the note.  Obesity [name] [name] is currently in the action stage of change. As such, her goal is to continue with weight loss efforts She has agreed to follow the Category 2 plan [name] [name] has been instructed to work up to a goal of 150 minutes of combined cardio and strengthening exercise per week for weight loss and overall health benefits. We discussed the following Behavioral Modification Strategies today: increasing lean protein intake, decreasing simple carbohydrates  and work on meal planning and easy cooking plans  [name] [name] has agreed to follow up with our clinic in 2 to 3 weeks. She was informed of the importance of frequent follow up visits to maximize her success with intensive lifestyle modifications for her multiple health conditions.   OBESITY BEHAVIORAL INTERVENTION VISIT  Today's visit was # 9 out of 22.  Starting weight: 267 lbs Starting date: 09/17/17 Today's weight : 243 lbs  Today's date: 01/26/2018 Total lbs lost to date: 24 (Patients must lose 7 lbs in the first 6 months to continue with counseling)   ASK: We discussed the diagnosis of obesity with [name] [name] today and [name] [name]  agreed to give [place] permission to discuss obesity behavioral modification therapy today.  ASSESS: [name] [name] has the diagnosis of obesity and her BMI today is 38.05 [name] [name] is in the action stage of change   ADVISE: [name] [name] was  educated on the multiple health risks of obesity as well as the benefit of weight loss to improve her health. She was advised of the need for long term treatment and the importance of lifestyle modifications.  AGREE: Multiple dietary modification options and treatment options were discussed and  [name] [name] agreed to the above obesity treatment plan.  I, Doreene Nest, am acting as transcriptionist for Dennard Nip, MD  I have reviewed the above documentation for accuracy and completeness, and I agree with the above. -Dennard Nip, MD

## 2018-01-30 DIAGNOSIS — N644 Mastodynia: Secondary | ICD-10-CM | POA: Diagnosis not present

## 2018-02-03 DIAGNOSIS — N644 Mastodynia: Secondary | ICD-10-CM | POA: Diagnosis not present

## 2018-02-04 DIAGNOSIS — H2513 Age-related nuclear cataract, bilateral: Secondary | ICD-10-CM | POA: Diagnosis not present

## 2018-02-04 DIAGNOSIS — H5203 Hypermetropia, bilateral: Secondary | ICD-10-CM | POA: Diagnosis not present

## 2018-02-04 DIAGNOSIS — H52223 Regular astigmatism, bilateral: Secondary | ICD-10-CM | POA: Diagnosis not present

## 2018-02-04 DIAGNOSIS — H524 Presbyopia: Secondary | ICD-10-CM | POA: Diagnosis not present

## 2018-02-09 ENCOUNTER — Ambulatory Visit (INDEPENDENT_AMBULATORY_CARE_PROVIDER_SITE_OTHER): Payer: Medicare Other | Admitting: Family Medicine

## 2018-02-09 VITALS — BP 115/75 | HR 73 | Temp 97.5°F | Ht 67.0 in | Wt 244.0 lb

## 2018-02-09 DIAGNOSIS — Z6838 Body mass index (BMI) 38.0-38.9, adult: Secondary | ICD-10-CM | POA: Diagnosis not present

## 2018-02-09 DIAGNOSIS — F3289 Other specified depressive episodes: Secondary | ICD-10-CM

## 2018-02-09 NOTE — Progress Notes (Signed)
Office: [phone]  /  Fax: [phone]   HPI:   Chief Complaint: OBESITY [name] [name] is here to discuss her progress with her obesity treatment plan. She is on the Category 2 plan and is following her eating plan approximately 75 % of the time. She states she is exercising 0 minutes 0 times per week. [name] [name] is retaining a bit of fluid. She has increased eating out, but she tried to make better choices. Her weight is 244 lb (110.7 kg) today and has had a weight gain of 1 pound over a period of 2 weeks since her last visit. She has lost 23 lbs since starting treatment with [place].  Depression with emotional eating behaviors [name] [name] is on Zoloft and her mood is stable, dealing with her daughter's death last year. [name] [name] is doing better with recognizing emotional eating. [name] [name] struggles with emotional eating and using food for comfort to the extent that it is negatively impacting her health. She often snacks when she is not hungry. [name] [name] sometimes feels she is out of control and then feels guilty that she made poor food choices. She has been working on behavior modification techniques to help reduce her emotional eating and has been somewhat successful. She shows no sign of suicidal or homicidal ideations.  Depression screen [facility] 2/9 01/21/2018 01/15/2018 09/17/2017 06/09/2017 01/15/2017  Decreased Interest _0 0  Down, Depressed, Hopeless 1 0 2 - 0  PHQ - 2 Score _1 0  Altered sleeping 1 0 0 0 -  Tired, decreased energy 2 0 3 3 -  Change in appetite 0 0 3 3 -  Feeling bad or failure about yourself  2 0 2 3 -  Trouble concentrating 2 0 3 0 -  Moving slowly or fidgety/restless 2 0 0 0 -  Suicidal thoughts 1 0 0 0 -  PHQ-9 Score _2 -  Difficult doing work/chores Somewhat difficult Not difficult at all Very difficult Not difficult at all -     ALLERGIES: Allergies  Allergen Reactions  . Ancef [Cefazolin] Hives and Itching  . Pseudoephedrine Hcl     Other reaction(s):  palpitations (moderate)  . Caffeine Palpitations    MEDICATIONS: Current Outpatient Medications on File Prior to Visit  Medication Sig Dispense Refill  . apixaban (ELIQUIS) 5 MG TABS tablet Take 1 tablet (5 mg total) by mouth 2 (two) times daily. 28 tablet 0  . Biotin 5000 MCG CAPS Take 5,000 mcg by mouth daily.     . Calcium Carbonate-Vit D-Min (CALCIUM 1200 PO) Take 2,400 mg by mouth daily.     . cholecalciferol (VITAMIN D) 1000 UNITS tablet Take 1,000 Units by mouth daily.     Marland Kitchen Co-Enzyme Q10 100 MG CAPS Take 100 mg by mouth daily.     . diazepam (VALIUM) 5 MG tablet TAKE 1 TABLET BY MOUTH ONCE DAILY AS NEEDED FOR ANXIETY 30 tablet 1  . fluticasone (FLONASE) 50 MCG/ACT nasal spray Place 2 sprays into both nostrils daily. 16 g 6  . folic acid (FOLVITE) 588 MCG tablet Take 400 mcg by mouth daily.    Marland Kitchen glucosamine-chondroitin 500-400 MG tablet Take 1 tablet by mouth daily.     Marland Kitchen loratadine (KLS ALLERCLEAR) 10 MG tablet Take 10 mg by mouth daily.    Marland Kitchen MAGNESIUM CITRATE PO Take 250 mg by mouth daily. 1 tab daily    . metoprolol tartrate (LOPRESSOR) 25 MG tablet Take 0.5 tablets (12.5 mg total) by  mouth 2 (two) times daily. 90 tablet 3  . nitroGLYCERIN (NITROSTAT) 0.4 MG SL tablet Place 0.4 mg under the tongue every 5 (five) minutes as needed for chest pain.    . pantoprazole (PROTONIX) 40 MG tablet TAKE 1 TABLET BY MOUTH DAILY 90 tablet 1  . Resveratrol 250 MG CAPS Take 250 mg by mouth daily.    . sertraline (ZOLOFT) 100 MG tablet TAKE 1 TABLET BY  MOUTH DAILY 90 tablet 0  . Simethicone (GAS-X PO) Take 2 tablets by mouth daily as needed (bloating).     No current facility-administered medications on file prior to visit.     PAST MEDICAL HISTORY: Past Medical History:  Diagnosis Date  . Alpha-1-antitrypsin deficiency carrier ([place])   . Arthritis    "all over"  . Atrial fibrillation ([place])   . Back pain   . Complication of anesthesia    "I woke up during 2 different procedures"  (10/04/2014)  . Frequent UTI   . GERD (gastroesophageal reflux disease)   . Hypertension   . MVP (mitral valve prolapse)   . Obesity   . Osteoarthritis   . Presence of permanent cardiac pacemaker   . PVC's (premature ventricular contractions)   . Shortness of breath   . 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 Sleep apnea   . Stroke [facility]) 01/2014   denies deficits on 10/04/2014  . Tachy-brady syndrome ([place]) 10/2014  . TIA (transient ischemic attack)     PAST SURGICAL HISTORY: Past Surgical History:  Procedure Laterality Date  . CARDIOVERSION N/A 09/02/2017   Procedure: CARDIOVERSION;  Surgeon: Sanda Klein, MD;  [facility]: [facility];  Service: Cardiovascular;  Laterality: N/A;  . EXCISION VAGINAL CYST     benign nodules  . INSERT / REPLACE / REMOVE PACEMAKER  10/04/2014   Medtronic Advisa model [id number] serial number [id number]  . LOOP RECORDER EXPLANT N/A 10/04/2014   Procedure: LOOP RECORDER EXPLANT;  Surgeon: Sanda Klein, MD;  Location: [place] CATH LAB;  Service: Cardiovascular;  Laterality: N/A;  . LOOP RECORDER IMPLANT N/A 04/19/2014   Procedure: LOOP RECORDER IMPLANT;  Surgeon: Sanda Klein, MD;  Location: [place] CATH LAB;  Service: Cardiovascular;  Laterality: N/A;  . NM MYOCAR PERF WALL MOTION  12/18/2007   normal  . PERMANENT PACEMAKER INSERTION N/A 10/04/2014   Procedure: PERMANENT PACEMAKER INSERTION;  Surgeon: Sanda Klein, MD;  Location: [place] CATH LAB;  Service: Cardiovascular;  Laterality: N/A;  . TUBAL LIGATION    . US ECHOCARDIOGRAPHY  05/15/2010   LA mildly dilated,mild mitral annular ca+, AOV mildly sclerotic, mild asymmetric LVH    SOCIAL HISTORY: Social History   Tobacco Use  . Smoking status: Former Smoker    Packs/day: 2.00    Years: 18.00    Pack years: 36.00    Types: Cigarettes  . Smokeless tobacco: Never Used  . Tobacco comment: "quit smoking in 1971"  Substance Use Topics  . Alcohol use: Yes     Alcohol/week: 0.0 oz    Comment: 10/04/2014 "might have a drink a couple times/yr"  . Drug use: No    FAMILY HISTORY: Family History  Problem Relation Age of Onset  . Heart attack Mother   . Sudden death Mother   . Depression Mother   . Stroke Father   . Heart failure Father   . Depression Father   . Heart failure Brother   . Stroke Brother   . Alpha-1 antitrypsin deficiency Daughter  ROS: Review of Systems  Constitutional: Negative for weight loss.  Psychiatric/Behavioral: Positive for depression. Negative for suicidal ideas.    PHYSICAL EXAM: Blood pressure 115/75, pulse 73, temperature (!) 97.5 F (36.4 C), temperature source Oral, height _0  (1.702 m), weight 244 lb (110.7 kg), SpO2 96 %. Body mass index is 38.22 kg/m. Physical Exam  Constitutional: She is oriented to person, place, and time. She appears well-developed and well-nourished.  Cardiovascular: Normal rate.  Pulmonary/Chest: Effort normal.  Musculoskeletal: Normal range of motion.  Neurological: She is oriented to person, place, and time.  Skin: Skin is warm and dry.  Psychiatric: She has a normal mood and affect. Her behavior is normal.  Vitals reviewed.   RECENT LABS AND TESTS: BMET    Component Value Date/Time   NA 142 12/24/2017 1140   K 4.7 12/24/2017 1140   CL 104 12/24/2017 1140   CO2 24 12/24/2017 1140   GLUCOSE 121 (H) 12/24/2017 1140   GLUCOSE 92 01/15/2017 1417   BUN 17 12/24/2017 1140   CREATININE 1.12 (H) 12/24/2017 1140   CREATININE 0.86 09/28/2014 1557   CALCIUM 9.5 12/24/2017 1140   GFRNONAA 47 (L) 12/24/2017 1140   GFRNONAA 56 (L) 06/06/2014 1653   GFRAA 55 (L) 12/24/2017 1140   GFRAA 65 06/06/2014 1653   Lab Results  Component Value Date   HGBA1C 5.8 (H) 12/24/2017   HGBA1C 6.2 (H) 09/17/2017   HGBA1C 5.9 (H) 03/09/2014   Lab Results  Component Value Date   INSULIN 19.4 12/24/2017   INSULIN 47.7 (H) 09/17/2017   CBC    Component Value Date/Time   WBC 8.5  09/17/2017 1131   WBC 9.1 02/24/2017 1555   RBC 5.22 09/17/2017 1131   RBC 5.09 02/24/2017 1555   HGB 14.9 09/17/2017 1131   HCT 45.1 09/17/2017 1131   PLT 258 08/27/2017 1509   MCV 86 09/17/2017 1131   MCH 28.5 09/17/2017 1131   MCH 29.0 09/28/2014 1557   MCHC 33.0 09/17/2017 1131   MCHC 33.9 02/24/2017 1555   RDW 13.5 09/17/2017 1131   LYMPHSABS 2.0 09/17/2017 1131   MONOABS 1.3 (H) 02/24/2017 1555   EOSABS 0.2 09/17/2017 1131   BASOSABS 0.0 09/17/2017 1131   Iron/TIBC/Ferritin/ %Sat No results found for: IRON, TIBC, FERRITIN, IRONPCTSAT Lipid Panel     Component Value Date/Time   CHOL 164 09/17/2017 1131   TRIG 117 09/17/2017 1131   HDL 47 09/17/2017 1131   CHOLHDL 3 01/15/2017 1417   VLDL 33.6 01/15/2017 1417   LDLCALC 94 09/17/2017 1131   LDLDIRECT 81.0 07/15/2016 1419   Hepatic Function Panel     Component Value Date/Time   PROT 7.6 12/24/2017 1140   ALBUMIN 4.2 12/24/2017 1140   AST 27 12/24/2017 1140   ALT 17 12/24/2017 1140   ALKPHOS 77 12/24/2017 1140   BILITOT 0.5 12/24/2017 1140   BILIDIR 0.1 01/15/2017 1417      Component Value Date/Time   TSH 2.040 09/17/2017 1131   TSH 2.70 07/15/2016 1419   TSH 1.529 06/06/2014 1608    ASSESSMENT AND PLAN: Other depression - with emotional eating  Class 2 severe obesity with serious comorbidity and body mass index (BMI) of 38.0 to 38.9 in adult, unspecified obesity type ([place])  PLAN:  Depression with Emotional Eating Behaviors We discussed behavior modification techniques today to help [name] [name] deal with her emotional eating and depression. She has agreed to continue Zoloft 100 mg daily and follow up as directed. We discussed ways  to decrease emotional eating.  We spent > than 50% of the 15 minute visit on the counseling as documented in the note.  Obesity [name] [name] is currently in the action stage of change. As such, her goal is to continue with weight loss efforts She has agreed to get back to the Category 2  plan [name] [name] has been instructed to work up to a goal of 150 minutes of combined cardio and strengthening exercise per week or start exercising 15 minutes 3 times per week for weight loss and overall health benefits. We discussed the following Behavioral Modification Strategies today: work on meal planning and easy cooking plans and emotional eating strategies  [name] [name] has agreed to follow up with our clinic in 3 weeks. She was informed of the importance of frequent follow up visits to maximize her success with intensive lifestyle modifications for her multiple health conditions.   OBESITY BEHAVIORAL INTERVENTION VISIT  Today's visit was # 10 out of 22.  Starting weight: 267 lbs Starting date: 09/17/17 Today's weight : 244 lbs Today's date: 02/09/2018 Total lbs lost to date: 23 (Patients must lose 7 lbs in the first 6 months to continue with counseling)   ASK: We discussed the diagnosis of obesity with [name] [name] today and [name] [name] agreed to give [place] permission to discuss obesity behavioral modification therapy today.  ASSESS: [name] [name] has the diagnosis of obesity and her BMI today is 38.21 [name] [name] is in the action stage of change   ADVISE: [name] [name] was educated on the multiple health risks of obesity as well as the benefit of weight loss to improve her health. She was advised of the need for long term treatment and the importance of lifestyle modifications.  AGREE: Multiple dietary modification options and treatment options were discussed and  [name] [name] agreed to the above obesity treatment plan.  I, Doreene Nest, am acting as transcriptionist for Dennard Nip, MD  I have reviewed the above documentation for accuracy and completeness, and I agree with the above. -Dennard Nip, MD

## 2018-02-13 DIAGNOSIS — J8410 Pulmonary fibrosis, unspecified: Secondary | ICD-10-CM | POA: Diagnosis not present

## 2018-02-13 DIAGNOSIS — M25551 Pain in right hip: Secondary | ICD-10-CM | POA: Diagnosis not present

## 2018-02-13 DIAGNOSIS — I4891 Unspecified atrial fibrillation: Secondary | ICD-10-CM | POA: Diagnosis not present

## 2018-02-13 DIAGNOSIS — I639 Cerebral infarction, unspecified: Secondary | ICD-10-CM | POA: Diagnosis not present

## 2018-02-27 DIAGNOSIS — H9201 Otalgia, right ear: Secondary | ICD-10-CM | POA: Diagnosis not present

## 2018-02-27 DIAGNOSIS — H6991 Unspecified Eustachian tube disorder, right ear: Secondary | ICD-10-CM | POA: Diagnosis not present

## 2018-02-27 DIAGNOSIS — N952 Postmenopausal atrophic vaginitis: Secondary | ICD-10-CM | POA: Diagnosis not present

## 2018-02-27 DIAGNOSIS — M25551 Pain in right hip: Secondary | ICD-10-CM | POA: Diagnosis not present

## 2018-02-27 DIAGNOSIS — Z0001 Encounter for general adult medical examination with abnormal findings: Secondary | ICD-10-CM | POA: Diagnosis not present

## 2018-03-03 ENCOUNTER — Ambulatory Visit (INDEPENDENT_AMBULATORY_CARE_PROVIDER_SITE_OTHER): Payer: Medicare Other | Admitting: Family Medicine

## 2018-03-03 VITALS — BP 93/60 | HR 75 | Temp 98.2°F | Ht 67.0 in | Wt 240.0 lb

## 2018-03-03 DIAGNOSIS — Z6837 Body mass index (BMI) 37.0-37.9, adult: Secondary | ICD-10-CM | POA: Diagnosis not present

## 2018-03-03 DIAGNOSIS — F3289 Other specified depressive episodes: Secondary | ICD-10-CM

## 2018-03-04 ENCOUNTER — Other Ambulatory Visit: Payer: Self-pay | Admitting: Pharmacist Clinician (PhC)/ Clinical Pharmacy Specialist

## 2018-03-04 MED ORDER — APIXABAN 5 MG PO TABS: 5.0000 mg | ORAL_TABLET | Freq: Two times a day (BID) | ORAL | 1 refills | Status: DC

## 2018-03-04 NOTE — Progress Notes (Signed)
Office: [phone]  /  Fax: [phone]   HPI:   Chief Complaint: OBESITY [name] [name] is here to discuss her progress with her obesity treatment plan. She is on the Category 2 plan and is following her eating plan approximately 65 % of the time. She states she is bike riding, swimming, and the elliptical for 30 minutes 4 times per week. [name] [name] has done very well with weight loss. She has increased exercise and hunger is controlled. She denies feeling deprived but has increased eating out.  Her weight is 240 lb (108.9 kg) today and has had a weight loss of 4 pounds over a period of 3 weeks since her last visit. She has lost 27 lbs since starting treatment with [place].  Depression with emotional eating behaviors [name] [name] is stable on Zoloft, feels her mood has improved. She is not needing xanax as often. She started OTC CBD oil and feels she is mentally sharper. [name] [name] struggles with emotional eating and using food for comfort to the extent that it is negatively impacting her health. She often snacks when she is not hungry. [name] [name] sometimes feels she is out of control and then feels guilty that she made poor food choices. She has been working on behavior modification techniques to help reduce her emotional eating and has been somewhat successful. She shows no sign of suicidal or homicidal ideations.  Depression screen [facility] 2/9 01/21/2018 01/15/2018 09/17/2017 06/09/2017 01/15/2017  Decreased Interest _0 0  Down, Depressed, Hopeless 1 0 2 - 0  PHQ - 2 Score _1 0  Altered sleeping 1 0 0 0 -  Tired, decreased energy 2 0 3 3 -  Change in appetite 0 0 3 3 -  Feeling bad or failure about yourself  2 0 2 3 -  Trouble concentrating 2 0 3 0 -  Moving slowly or fidgety/restless 2 0 0 0 -  Suicidal thoughts 1 0 0 0 -  PHQ-9 Score _2 -  Difficult doing work/chores Somewhat difficult Not difficult at all Very difficult Not difficult at all -   ALLERGIES: Allergies  Allergen Reactions  . Ancef  [Cefazolin] Hives and Itching  . Pseudoephedrine Hcl     Other reaction(s): palpitations (moderate)  . Caffeine Palpitations    MEDICATIONS: Current Outpatient Medications on File Prior to Visit  Medication Sig Dispense Refill  . Biotin 5000 MCG CAPS Take 5,000 mcg by mouth daily.     . Calcium Carbonate-Vit D-Min (CALCIUM 1200 PO) Take 2,400 mg by mouth daily.     . cholecalciferol (VITAMIN D) 1000 UNITS tablet Take 1,000 Units by mouth daily.     Marland Kitchen Co-Enzyme Q10 100 MG CAPS Take 100 mg by mouth daily.     . diazepam (VALIUM) 5 MG tablet TAKE 1 TABLET BY MOUTH ONCE DAILY AS NEEDED FOR ANXIETY 30 tablet 1  . fluticasone (FLONASE) 50 MCG/ACT nasal spray Place 2 sprays into both nostrils daily. 16 g 6  . folic acid (FOLVITE) 162 MCG tablet Take 400 mcg by mouth daily.    Marland Kitchen glucosamine-chondroitin 500-400 MG tablet Take 1 tablet by mouth daily.     Marland Kitchen loratadine (KLS ALLERCLEAR) 10 MG tablet Take 10 mg by mouth daily.    Marland Kitchen MAGNESIUM CITRATE PO Take 250 mg by mouth daily. 1 tab daily    . metoprolol tartrate (LOPRESSOR) 25 MG tablet Take 0.5 tablets (12.5 mg total) by mouth 2 (two) times daily. 90 tablet 3  .  nitroGLYCERIN (NITROSTAT) 0.4 MG SL tablet Place 0.4 mg under the tongue every 5 (five) minutes as needed for chest pain.    . pantoprazole (PROTONIX) 40 MG tablet TAKE 1 TABLET BY MOUTH DAILY 90 tablet 1  . Resveratrol 250 MG CAPS Take 250 mg by mouth daily.    . sertraline (ZOLOFT) 100 MG tablet TAKE 1 TABLET BY  MOUTH DAILY 90 tablet 0  . Simethicone (GAS-X PO) Take 2 tablets by mouth daily as needed (bloating).     No current facility-administered medications on file prior to visit.     PAST MEDICAL HISTORY: Past Medical History:  Diagnosis Date  . Alpha-1-antitrypsin deficiency carrier ([place])   . Arthritis    "all over"  . Atrial fibrillation ([place])   . Back pain   . Complication of anesthesia    "I woke up during 2 different procedures" (10/04/2014)  . Frequent UTI   . GERD  (gastroesophageal reflux disease)   . Hypertension   . MVP (mitral valve prolapse)   . Obesity   . Osteoarthritis   . Presence of permanent cardiac pacemaker   . PVC's (premature ventricular contractions)   . Shortness of breath   . 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 Sleep apnea   . Stroke [facility]) 01/2014   denies deficits on 10/04/2014  . Tachy-brady syndrome ([place]) 10/2014  . TIA (transient ischemic attack)     PAST SURGICAL HISTORY: Past Surgical History:  Procedure Laterality Date  . CARDIOVERSION N/A 09/02/2017   Procedure: CARDIOVERSION;  Surgeon: Sanda Klein, MD;  [facility] ENDOSCOPY;  Service: Cardiovascular;  Laterality: N/A;  . EXCISION VAGINAL CYST     benign nodules  . INSERT / REPLACE / REMOVE PACEMAKER  10/04/2014   Medtronic Advisa model [id number] serial number [id number]  . LOOP RECORDER EXPLANT N/A 10/04/2014   Procedure: LOOP RECORDER EXPLANT;  Surgeon: Sanda Klein, MD;  Location: [place] CATH LAB;  Service: Cardiovascular;  Laterality: N/A;  . LOOP RECORDER IMPLANT N/A 04/19/2014   Procedure: LOOP RECORDER IMPLANT;  Surgeon: Sanda Klein, MD;  Location: [place] CATH LAB;  Service: Cardiovascular;  Laterality: N/A;  . NM MYOCAR PERF WALL MOTION  12/18/2007   normal  . PERMANENT PACEMAKER INSERTION N/A 10/04/2014   Procedure: PERMANENT PACEMAKER INSERTION;  Surgeon: Sanda Klein, MD;  Location: [place] CATH LAB;  Service: Cardiovascular;  Laterality: N/A;  . TUBAL LIGATION    . US ECHOCARDIOGRAPHY  05/15/2010   LA mildly dilated,mild mitral annular ca+, AOV mildly sclerotic, mild asymmetric LVH    SOCIAL HISTORY: Social History   Tobacco Use  . Smoking status: Former Smoker    Packs/day: 2.00    Years: 18.00    Pack years: 36.00    Types: Cigarettes  . Smokeless tobacco: Never Used  . Tobacco comment: "quit smoking in 1971"  Substance Use Topics  . Alcohol use: Yes    Alcohol/week: 0.0 oz    Comment: 10/04/2014  "might have a drink a couple times/yr"  . Drug use: No    FAMILY HISTORY: Family History  Problem Relation Age of Onset  . Heart attack Mother   . Sudden death Mother   . Depression Mother   . Stroke Father   . Heart failure Father   . Depression Father   . Heart failure Brother   . Stroke Brother   . Alpha-1 antitrypsin deficiency Daughter     ROS: Review of Systems  Constitutional: Positive  for weight loss.  Psychiatric/Behavioral: Positive for depression. Negative for suicidal ideas.    PHYSICAL EXAM: Blood pressure 93/60, pulse 75, temperature 98.2 F (36.8 C), temperature source Oral, height _0  (1.702 m), weight 240 lb (108.9 kg), SpO2 96 %. Body mass index is 37.59 kg/m. Physical Exam  Constitutional: She is oriented to person, place, and time. She appears well-developed and well-nourished.  Cardiovascular: Normal rate.  Pulmonary/Chest: Effort normal.  Musculoskeletal: Normal range of motion.  Neurological: She is oriented to person, place, and time.  Skin: Skin is warm and dry.  Psychiatric: She has a normal mood and affect. Her behavior is normal.  Vitals reviewed.   RECENT LABS AND TESTS: BMET    Component Value Date/Time   NA 142 12/24/2017 1140   K 4.7 12/24/2017 1140   CL 104 12/24/2017 1140   CO2 24 12/24/2017 1140   GLUCOSE 121 (H) 12/24/2017 1140   GLUCOSE 92 01/15/2017 1417   BUN 17 12/24/2017 1140   CREATININE 1.12 (H) 12/24/2017 1140   CREATININE 0.86 09/28/2014 1557   CALCIUM 9.5 12/24/2017 1140   GFRNONAA 47 (L) 12/24/2017 1140   GFRNONAA 56 (L) 06/06/2014 1653   GFRAA 55 (L) 12/24/2017 1140   GFRAA 65 06/06/2014 1653   Lab Results  Component Value Date   HGBA1C 5.8 (H) 12/24/2017   HGBA1C 6.2 (H) 09/17/2017   HGBA1C 5.9 (H) 03/09/2014   Lab Results  Component Value Date   INSULIN 19.4 12/24/2017   INSULIN 47.7 (H) 09/17/2017   CBC    Component Value Date/Time   WBC 8.5 09/17/2017 1131   WBC 9.1 02/24/2017 1555   RBC  5.22 09/17/2017 1131   RBC 5.09 02/24/2017 1555   HGB 14.9 09/17/2017 1131   HCT 45.1 09/17/2017 1131   PLT 258 08/27/2017 1509   MCV 86 09/17/2017 1131   MCH 28.5 09/17/2017 1131   MCH 29.0 09/28/2014 1557   MCHC 33.0 09/17/2017 1131   MCHC 33.9 02/24/2017 1555   RDW 13.5 09/17/2017 1131   LYMPHSABS 2.0 09/17/2017 1131   MONOABS 1.3 (H) 02/24/2017 1555   EOSABS 0.2 09/17/2017 1131   BASOSABS 0.0 09/17/2017 1131   Iron/TIBC/Ferritin/ %Sat No results found for: IRON, TIBC, FERRITIN, IRONPCTSAT Lipid Panel     Component Value Date/Time   CHOL 164 09/17/2017 1131   TRIG 117 09/17/2017 1131   HDL 47 09/17/2017 1131   CHOLHDL 3 01/15/2017 1417   VLDL 33.6 01/15/2017 1417   LDLCALC 94 09/17/2017 1131   LDLDIRECT 81.0 07/15/2016 1419   Hepatic Function Panel     Component Value Date/Time   PROT 7.6 12/24/2017 1140   ALBUMIN 4.2 12/24/2017 1140   AST 27 12/24/2017 1140   ALT 17 12/24/2017 1140   ALKPHOS 77 12/24/2017 1140   BILITOT 0.5 12/24/2017 1140   BILIDIR 0.1 01/15/2017 1417      Component Value Date/Time   TSH 2.040 09/17/2017 1131   TSH 2.70 07/15/2016 1419   TSH 1.529 06/06/2014 1608    ASSESSMENT AND PLAN: Other depression - with emotional eating  Class 2 severe obesity with serious comorbidity and body mass index (BMI) of 37.0 to 37.9 in adult, unspecified obesity type ([place])  PLAN:  Depression with Emotional Eating Behaviors We discussed behavior modification techniques today to help [name] [name] deal with her emotional eating and depression. [name] [name] agrees to continue taking Zoloft and will continue to monitor her mood especially while taking CBD oil. She was informed we don't have evidence  of safety or effectiveness, and [name] [name] understood. [name] [name] agrees to follow up with our clinic in 2 weeks.  We spent > than 50% of the 15 minute visit on the counseling as documented in the note.  Obesity [name] [name] is currently in the action stage of change. As such, her goal  is to continue with weight loss efforts She has agreed to follow the Category 2 plan [name] [name] has been instructed to work up to a goal of 150 minutes of combined cardio and strengthening exercise per week for weight loss and overall health benefits. We discussed the following Behavioral Modification Strategies today: increasing lean protein intake, decreasing simple carbohydrates, work on meal planning and easy cooking plans and emotional eating strategies   [name] [name] has agreed to follow up with our clinic in 2 weeks. She was informed of the importance of frequent follow up visits to maximize her success with intensive lifestyle modifications for her multiple health conditions.   OBESITY BEHAVIORAL INTERVENTION VISIT  Today's visit was # 11 out of 22.  Starting weight: 267 lbs Starting date: 09/17/17 Today's weight : 240 lbs  Today's date: 03/03/2018 Total lbs lost to date: 17 (Patients must lose 7 lbs in the first 6 months to continue with counseling)   ASK: We discussed the diagnosis of obesity with [name] [name] today and [name] [name] agreed to give [place] permission to discuss obesity behavioral modification therapy today.  ASSESS: [name] [name] has the diagnosis of obesity and her BMI today is 37.58 [name] [name] is in the action stage of change   ADVISE: [name] [name] was educated on the multiple health risks of obesity as well as the benefit of weight loss to improve her health. She was advised of the need for long term treatment and the importance of lifestyle modifications.  AGREE: Multiple dietary modification options and treatment options were discussed and  [name] [name] agreed to the above obesity treatment plan.  I, Trixie Dredge, am acting as transcriptionist for Dennard Nip, MD  I have reviewed the above documentation for accuracy and completeness, and I agree with the above. -Dennard Nip, MD

## 2018-03-05 DIAGNOSIS — L821 Other seborrheic keratosis: Secondary | ICD-10-CM | POA: Diagnosis not present

## 2018-03-05 DIAGNOSIS — D1801 Hemangioma of skin and subcutaneous tissue: Secondary | ICD-10-CM | POA: Diagnosis not present

## 2018-03-05 DIAGNOSIS — D2262 Melanocytic nevi of left upper limb, including shoulder: Secondary | ICD-10-CM | POA: Diagnosis not present

## 2018-03-17 ENCOUNTER — Ambulatory Visit (INDEPENDENT_AMBULATORY_CARE_PROVIDER_SITE_OTHER): Payer: Medicare Other | Admitting: Family Medicine

## 2018-03-17 VITALS — BP 107/69 | HR 80 | Temp 97.5°F | Ht 67.0 in | Wt 242.0 lb

## 2018-03-17 DIAGNOSIS — Z6837 Body mass index (BMI) 37.0-37.9, adult: Secondary | ICD-10-CM | POA: Diagnosis not present

## 2018-03-17 DIAGNOSIS — F3289 Other specified depressive episodes: Secondary | ICD-10-CM | POA: Diagnosis not present

## 2018-03-17 NOTE — Progress Notes (Signed)
Office: [phone]  /  Fax: [phone]   HPI:   Chief Complaint: OBESITY [name] [name] is here to discuss her progress with her obesity treatment plan. She is on the Category 2 plan and is following her eating plan approximately 50 % of the time. She states she is swimming, walking, and bike riding for 30 minutes 3-4 times per week. [name] [name] has increased her exercise with H20 exercises. She is deviating from her diet prescription more, mostly trying to portion control and making smarter choices and has started to gain weight.  Her weight is 242 lb (109.8 kg) today and has gained 2 pounds since her last visit. She has lost 15 lbs since starting treatment with [place].  Depression with emotional eating behaviors [name] [name] is on Zoloft and has had multiple stressors. She is frustrated with herself that she is not losing weight but also that she is not following her plan. [name] [name] struggles with emotional eating and using food for comfort to the extent that it is negatively impacting her health. She often snacks when she is not hungry. [name] [name] sometimes feels she is out of control and then feels guilty that she made poor food choices. She has been working on behavior modification techniques to help reduce her emotional eating and has been somewhat successful. She shows no sign of suicidal or homicidal ideations.  Depression screen [facility] 2/9 01/21/2018 01/15/2018 09/17/2017 06/09/2017 01/15/2017  Decreased Interest _0 0  Down, Depressed, Hopeless 1 0 2 - 0  PHQ - 2 Score _1 0  Altered sleeping 1 0 0 0 -  Tired, decreased energy 2 0 3 3 -  Change in appetite 0 0 3 3 -  Feeling bad or failure about yourself  2 0 2 3 -  Trouble concentrating 2 0 3 0 -  Moving slowly or fidgety/restless 2 0 0 0 -  Suicidal thoughts 1 0 0 0 -  PHQ-9 Score _2 -  Difficult doing work/chores Somewhat difficult Not difficult at all Very difficult Not difficult at all -    ALLERGIES: Allergies  Allergen Reactions  .  Ancef [Cefazolin] Hives and Itching  . Pseudoephedrine Hcl     Other reaction(s): palpitations (moderate)  . Caffeine Palpitations    MEDICATIONS: Current Outpatient Medications on File Prior to Visit  Medication Sig Dispense Refill  . apixaban (ELIQUIS) 5 MG TABS tablet Take 1 tablet (5 mg total) by mouth 2 (two) times daily. 180 tablet 1  . Biotin 5000 MCG CAPS Take 5,000 mcg by mouth daily.     . Calcium Carbonate-Vit D-Min (CALCIUM 1200 PO) Take 2,400 mg by mouth daily.     . cholecalciferol (VITAMIN D) 1000 UNITS tablet Take 1,000 Units by mouth daily.     Marland Kitchen Co-Enzyme Q10 100 MG CAPS Take 100 mg by mouth daily.     . diazepam (VALIUM) 5 MG tablet TAKE 1 TABLET BY MOUTH ONCE DAILY AS NEEDED FOR ANXIETY 30 tablet 1  . fluticasone (FLONASE) 50 MCG/ACT nasal spray Place 2 sprays into both nostrils daily. 16 g 6  . folic acid (FOLVITE) 032 MCG tablet Take 400 mcg by mouth daily.    Marland Kitchen glucosamine-chondroitin 500-400 MG tablet Take 1 tablet by mouth daily.     Marland Kitchen loratadine (KLS ALLERCLEAR) 10 MG tablet Take 10 mg by mouth daily.    Marland Kitchen MAGNESIUM CITRATE PO Take 250 mg by mouth daily. 1 tab daily    . metoprolol  tartrate (LOPRESSOR) 25 MG tablet Take 0.5 tablets (12.5 mg total) by mouth 2 (two) times daily. 90 tablet 3  . nitroGLYCERIN (NITROSTAT) 0.4 MG SL tablet Place 0.4 mg under the tongue every 5 (five) minutes as needed for chest pain.    . pantoprazole (PROTONIX) 40 MG tablet TAKE 1 TABLET BY MOUTH DAILY 90 tablet 1  . Resveratrol 250 MG CAPS Take 250 mg by mouth daily.    . sertraline (ZOLOFT) 100 MG tablet TAKE 1 TABLET BY  MOUTH DAILY 90 tablet 0  . Simethicone (GAS-X PO) Take 2 tablets by mouth daily as needed (bloating).     No current facility-administered medications on file prior to visit.     PAST MEDICAL HISTORY: Past Medical History:  Diagnosis Date  . Alpha-1-antitrypsin deficiency carrier ([place])   . Arthritis    "all over"  . Atrial fibrillation ([place])   . Back pain    . Complication of anesthesia    "I woke up during 2 different procedures" (10/04/2014)  . Frequent UTI   . GERD (gastroesophageal reflux disease)   . Hypertension   . MVP (mitral valve prolapse)   . Obesity   . Osteoarthritis   . Presence of permanent cardiac pacemaker   . PVC's (premature ventricular contractions)   . Shortness of breath   . 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 Sleep apnea   . Stroke [facility]) 01/2014   denies deficits on 10/04/2014  . Tachy-brady syndrome ([place]) 10/2014  . TIA (transient ischemic attack)     PAST SURGICAL HISTORY: Past Surgical History:  Procedure Laterality Date  . CARDIOVERSION N/A 09/02/2017   Procedure: CARDIOVERSION;  Surgeon: Sanda Klein, MD;  [facility] ENDOSCOPY;  Service: Cardiovascular;  Laterality: N/A;  . EXCISION VAGINAL CYST     benign nodules  . INSERT / REPLACE / REMOVE PACEMAKER  10/04/2014   Medtronic Advisa model [id number] serial number [id number]  . LOOP RECORDER EXPLANT N/A 10/04/2014   Procedure: LOOP RECORDER EXPLANT;  Surgeon: Sanda Klein, MD;  Location: [place] CATH LAB;  Service: Cardiovascular;  Laterality: N/A;  . LOOP RECORDER IMPLANT N/A 04/19/2014   Procedure: LOOP RECORDER IMPLANT;  Surgeon: Sanda Klein, MD;  Location: [place] CATH LAB;  Service: Cardiovascular;  Laterality: N/A;  . NM MYOCAR PERF WALL MOTION  12/18/2007   normal  . PERMANENT PACEMAKER INSERTION N/A 10/04/2014   Procedure: PERMANENT PACEMAKER INSERTION;  Surgeon: Sanda Klein, MD;  Location: [place] CATH LAB;  Service: Cardiovascular;  Laterality: N/A;  . TUBAL LIGATION    . US ECHOCARDIOGRAPHY  05/15/2010   LA mildly dilated,mild mitral annular ca+, AOV mildly sclerotic, mild asymmetric LVH    SOCIAL HISTORY: Social History   Tobacco Use  . Smoking status: Former Smoker    Packs/day: 2.00    Years: 18.00    Pack years: 36.00    Types: Cigarettes  . Smokeless tobacco: Never Used  . Tobacco comment:  "quit smoking in 1971"  Substance Use Topics  . Alcohol use: Yes    Alcohol/week: 0.0 oz    Comment: 10/04/2014 "might have a drink a couple times/yr"  . Drug use: No    FAMILY HISTORY: Family History  Problem Relation Age of Onset  . Heart attack Mother   . Sudden death Mother   . Depression Mother   . Stroke Father   . Heart failure Father   . Depression Father   . Heart failure Brother   .  Stroke Brother   . Alpha-1 antitrypsin deficiency Daughter     ROS: Review of Systems  Constitutional: Negative for weight loss.  Psychiatric/Behavioral: Positive for depression. Negative for suicidal ideas.    PHYSICAL EXAM: Blood pressure 107/69, pulse 80, temperature (!) 97.5 F (36.4 C), temperature source Oral, height _0  (1.702 m), weight 242 lb (109.8 kg), SpO2 97 %. Body mass index is 37.9 kg/m. Physical Exam  Constitutional: She is oriented to person, place, and time. She appears well-developed and well-nourished.  Cardiovascular: Normal rate.  Pulmonary/Chest: Effort normal.  Musculoskeletal: Normal range of motion.  Neurological: She is oriented to person, place, and time.  Skin: Skin is warm and dry.  Psychiatric: She has a normal mood and affect. Her behavior is normal.  Vitals reviewed.   RECENT LABS AND TESTS: BMET    Component Value Date/Time   NA 142 12/24/2017 1140   K 4.7 12/24/2017 1140   CL 104 12/24/2017 1140   CO2 24 12/24/2017 1140   GLUCOSE 121 (H) 12/24/2017 1140   GLUCOSE 92 01/15/2017 1417   BUN 17 12/24/2017 1140   CREATININE 1.12 (H) 12/24/2017 1140   CREATININE 0.86 09/28/2014 1557   CALCIUM 9.5 12/24/2017 1140   GFRNONAA 47 (L) 12/24/2017 1140   GFRNONAA 56 (L) 06/06/2014 1653   GFRAA 55 (L) 12/24/2017 1140   GFRAA 65 06/06/2014 1653   Lab Results  Component Value Date   HGBA1C 5.8 (H) 12/24/2017   HGBA1C 6.2 (H) 09/17/2017   HGBA1C 5.9 (H) 03/09/2014   Lab Results  Component Value Date   INSULIN 19.4 12/24/2017   INSULIN  47.7 (H) 09/17/2017   CBC    Component Value Date/Time   WBC 8.5 09/17/2017 1131   WBC 9.1 02/24/2017 1555   RBC 5.22 09/17/2017 1131   RBC 5.09 02/24/2017 1555   HGB 14.9 09/17/2017 1131   HCT 45.1 09/17/2017 1131   PLT 258 08/27/2017 1509   MCV 86 09/17/2017 1131   MCH 28.5 09/17/2017 1131   MCH 29.0 09/28/2014 1557   MCHC 33.0 09/17/2017 1131   MCHC 33.9 02/24/2017 1555   RDW 13.5 09/17/2017 1131   LYMPHSABS 2.0 09/17/2017 1131   MONOABS 1.3 (H) 02/24/2017 1555   EOSABS 0.2 09/17/2017 1131   BASOSABS 0.0 09/17/2017 1131   Iron/TIBC/Ferritin/ %Sat No results found for: IRON, TIBC, FERRITIN, IRONPCTSAT Lipid Panel     Component Value Date/Time   CHOL 164 09/17/2017 1131   TRIG 117 09/17/2017 1131   HDL 47 09/17/2017 1131   CHOLHDL 3 01/15/2017 1417   VLDL 33.6 01/15/2017 1417   LDLCALC 94 09/17/2017 1131   LDLDIRECT 81.0 07/15/2016 1419   Hepatic Function Panel     Component Value Date/Time   PROT 7.6 12/24/2017 1140   ALBUMIN 4.2 12/24/2017 1140   AST 27 12/24/2017 1140   ALT 17 12/24/2017 1140   ALKPHOS 77 12/24/2017 1140   BILITOT 0.5 12/24/2017 1140   BILIDIR 0.1 01/15/2017 1417      Component Value Date/Time   TSH 2.040 09/17/2017 1131   TSH 2.70 07/15/2016 1419   TSH 1.529 06/06/2014 1608    ASSESSMENT AND PLAN: Other depression - with emotional eating  Class 2 severe obesity with serious comorbidity and body mass index (BMI) of 37.0 to 37.9 in adult, unspecified obesity type ([place])  PLAN:  Depression with Emotional Eating Behaviors We discussed behavior modification techniques today to help [name] [name] deal with her emotional eating and depression. She was offered cognitive  behavioral therapy to help identify the details she can change to help her plan better to get back on track. We will continue to monitor her closely. [name] [name] agrees to follow up with our clinic in 3 weeks.  We spent > than 50% of the 15 minute visit on the counseling as documented  in the note.  Obesity [name] [name] is currently in the action stage of change. As such, her goal is to continue with weight loss efforts She has agreed to follow the Category 2 plan [name] [name] has been instructed to work up to a goal of 150 minutes of combined cardio and strengthening exercise per week for weight loss and overall health benefits. We discussed the following Behavioral Modification Strategies today: increasing lean protein intake, decreasing simple carbohydrates  and emotional eating strategies   [name] [name] has agreed to follow up with our clinic in 3 weeks. She was informed of the importance of frequent follow up visits to maximize her success with intensive lifestyle modifications for her multiple health conditions.   OBESITY BEHAVIORAL INTERVENTION VISIT  Today's visit was # 2 out of 22.  Starting weight: 267 lbs Starting date: 09/17/17 Today's weight : 242 lbs Today's date: 03/17/2018 Total lbs lost to date: 15 (Patients must lose 7 lbs in the first 6 months to continue with counseling)   ASK: We discussed the diagnosis of obesity with [name] [name] today and [name] [name] agreed to give [place] permission to discuss obesity behavioral modification therapy today.  ASSESS: [name] [name] has the diagnosis of obesity and her BMI today is 37.89 [name] [name] is in the action stage of change   ADVISE: [name] [name] was educated on the multiple health risks of obesity as well as the benefit of weight loss to improve her health. She was advised of the need for long term treatment and the importance of lifestyle modifications.  AGREE: Multiple dietary modification options and treatment options were discussed and  [name] [name] agreed to the above obesity treatment plan.  I, Trixie Dredge, am acting as transcriptionist for Dennard Nip, MD  I have reviewed the above documentation for accuracy and completeness, and I agree with the above. -Dennard Nip, MD

## 2018-03-19 ENCOUNTER — Encounter: Payer: Self-pay | Admitting: Internal Medicine

## 2018-03-19 ENCOUNTER — Ambulatory Visit (INDEPENDENT_AMBULATORY_CARE_PROVIDER_SITE_OTHER): Payer: Medicare Other | Admitting: Internal Medicine

## 2018-03-19 VITALS — BP 126/84 | HR 100 | Ht 66.0 in | Wt 246.2 lb

## 2018-03-19 DIAGNOSIS — R918 Other nonspecific abnormal finding of lung field: Secondary | ICD-10-CM | POA: Diagnosis not present

## 2018-03-19 DIAGNOSIS — J3089 Other allergic rhinitis: Secondary | ICD-10-CM | POA: Diagnosis not present

## 2018-03-19 DIAGNOSIS — J849 Interstitial pulmonary disease, unspecified: Secondary | ICD-10-CM

## 2018-03-19 DIAGNOSIS — J8410 Pulmonary fibrosis, unspecified: Secondary | ICD-10-CM

## 2018-03-19 DIAGNOSIS — J302 Other seasonal allergic rhinitis: Secondary | ICD-10-CM | POA: Diagnosis not present

## 2018-03-19 MED ORDER — PHENYLEPHRINE HCL 1 % NA SOLN
3.0000 [drp] | Freq: Once | NASAL | Status: AC
  Administered 2018-03-19: 3 [drp] via NASAL

## 2018-03-19 NOTE — Progress Notes (Signed)
HPI  female former smoker followed for dyspnea on exertion, lung nodules, fibrosis, complicated by A. 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 She does not qualify for portable oxygen CT chest 01/06/17-pulmonary fibrosis, small nodules ANA 1:160 ------------------------------------------------------------------------------------------  06/30/17- 77 year old female former smoker followed for dyspnea on exertion, lung nodules, fibrosis, ANA 2:951, complicated by A. Fib/ pacemaker/Eliquis ,a1AT carrierMZ, obesity OSA/CPAP/Advanced-cardiology FOLLOWS FOR: Pt continues to have SOB with exertion. Pt states Breo inhaler has caused her to have burning in mouth and no taste. Pt has been rinising her mouth well after use.  Likes her pulmonary rehabilitation course. Cardiology manages her CPAP/Advanced? 86 We reviewed her CT image. There is minimal subpleural fibrosis, nonspecific. Tiny nodules have been stable. CT chest 05/05/17 IMPRESSION: Scattered mild subpleural nodularity in the lungs bilaterally, measuring up to 4 x 9 mm in the right lower lobe, likely benign. Three month stability has been demonstrated. Follow-up CT chest in 15-21 months is considered optional for low-risk patients, but is recommended for high-risk patients. This recommendation follows the consensus statement: Guidelines for Management of Incidental Pulmonary Nodules Detected on CT Images: From the Fleischner Society 2017; Radiology 2017; [id number].  09/18/17- 77 year old female former smoker followed for dyspnea on exertion, lung nodules, fibrosis, ANA 0:160, complicated by A. 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 Sleeps better with CPAP- managed by  cardiology No real change in DOE and no significant cough or wheeze.  No acute events.  03/19/2018- 77 year old female former smoker followed for dyspnea on exertion, lung nodules, fibrosis, ANA 1:093, complicated by A. 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  Occasional cough.  Left eustachian pressure without hearing loss.  Dyspnea on exertion consistent with her obesity and deconditioning.  No acute event. We reviewed her CT images from last year showing subpleural nodules and minimal interstitial fibrotic change.  ROS-see HPI   + = pos Constitutional:    weight loss, night sweats, fevers, chills, fatigue, lassitude. HEENT:    headaches, difficulty swallowing, tooth/dental problems, sore throat,       sneezing, itching, ear ache, nasal congestion, post nasal drip, snoring CV:    chest pain, orthopnea, PND, swelling in lower extremities, anasarca,                                                dizziness, palpitations Resp:   +shortness of breath with exertion or at rest.                +productive cough,   non-productive cough, coughing up of blood.              change in color of mucus.  wheezing.   Skin:    rash or lesions. GI:  No-   heartburn, indigestion, abdominal pain, nausea, vomiting, diarrhea,                 change in bowel habits, loss of appetite GU: dysuria, change in color of urine, no urgency or frequency.   flank pain. MS:   joint pain, stiffness, decreased range of motion, back pain. Neuro-  nothing unusual Psych:  change in mood or affect.  depression or anxiety.   memory loss.  OBJ- Physical Exam General- Alert, Oriented, Affect-appropriate, Distress- none acute, + obese Skin- rash-none, lesions- none, excoriation- none Lymphadenopathy- none Head- atraumatic            Eyes- Gross vision intact, PERRLA, conjunctivae and  secretions clear            Ears- Hearing, canals-normal            Nose- Clear, no-Septal dev, mucus, polyps, erosion, perforation             Throat- Mallampati II , mucosa clear , drainage- none, tonsils- atrophic Neck- flexible , trachea midline, no stridor , thyroid nl, carotid no bruit Chest - symmetrical excursion , unlabored           Heart/CV- RRR , no murmur , no gallop  , no rub, nl s1 s2                           - JVD- none , edema- none, stasis changes- none, varices- none           Lung- + clear, wheeze- none, cough- none , dullness-none, rub- none           Chest wall- +Pacemaker Left Abd-  Br/ Gen/ Rectal- Not done, not indicated Extrem- cyanosis- none, clubbing, none, atrophy- none, strength- nl Neuro- grossly intact to observation

## 2018-03-19 NOTE — Patient Instructions (Signed)
Order- schedule CT chest Hi Res, ILD protocol    Dx lung nodules bilateral 4x9 mm, ILD  Neb neo nasal     Dx seasonal and perennial allergic rhinitis, eustachian dysfunction  Please call if needed

## 2018-03-23 NOTE — Assessment & Plan Note (Signed)
Small nodules, nonspecific. Plan-CT

## 2018-03-23 NOTE — Assessment & Plan Note (Signed)
We reviewed the images. Plan-update high-resolution CT chest for comparison

## 2018-04-01 ENCOUNTER — Telehealth: Payer: Self-pay | Admitting: *Deleted

## 2018-04-01 NOTE — Telephone Encounter (Signed)
Faxed order for nasal mask supplies ordered on 03/24/18 to [facility].

## 2018-04-02 ENCOUNTER — Other Ambulatory Visit: Payer: Self-pay | Admitting: Family Medicine

## 2018-04-03 ENCOUNTER — Ambulatory Visit (INDEPENDENT_AMBULATORY_CARE_PROVIDER_SITE_OTHER)
Admission: RE | Admit: 2018-04-03 | Discharge: 2018-04-03 | Disposition: A | Discharge status: Home / Self Care | Payer: Medicare Other | Source: Ambulatory Visit | Attending: Internal Medicine | Admitting: Internal Medicine

## 2018-04-03 DIAGNOSIS — J849 Interstitial pulmonary disease, unspecified: Secondary | ICD-10-CM | POA: Diagnosis not present

## 2018-04-03 DIAGNOSIS — R918 Other nonspecific abnormal finding of lung field: Secondary | ICD-10-CM | POA: Diagnosis not present

## 2018-04-06 ENCOUNTER — Telehealth: Payer: Self-pay | Admitting: Internal Medicine

## 2018-04-06 ENCOUNTER — Ambulatory Visit (INDEPENDENT_AMBULATORY_CARE_PROVIDER_SITE_OTHER): Payer: Medicare Other | Admitting: *Deleted

## 2018-04-06 ENCOUNTER — Ambulatory Visit (INDEPENDENT_AMBULATORY_CARE_PROVIDER_SITE_OTHER): Payer: Medicare Other | Admitting: Family Medicine

## 2018-04-06 DIAGNOSIS — I495 Sick sinus syndrome: Secondary | ICD-10-CM

## 2018-04-06 NOTE — Telephone Encounter (Signed)
Notes recorded by Deneise Lever, MD on 04/06/2018 at 8:56 AM EDT CT chest- small nodules are stable or even a little smaller- radiologist thinks they are benign. We will discuss at next ov.  Pt is aware of above results and voiced her understanding.  Nothing further is needed.

## 2018-04-06 NOTE — Progress Notes (Signed)
Remote pacemaker transmission.   

## 2018-04-07 ENCOUNTER — Ambulatory Visit (INDEPENDENT_AMBULATORY_CARE_PROVIDER_SITE_OTHER): Payer: Medicare Other | Admitting: Family Medicine

## 2018-04-07 VITALS — BP 104/66 | HR 65 | Temp 98.2°F | Ht 66.0 in | Wt 240.0 lb

## 2018-04-07 DIAGNOSIS — Z6837 Body mass index (BMI) 37.0-37.9, adult: Secondary | ICD-10-CM | POA: Diagnosis not present

## 2018-04-07 DIAGNOSIS — F418 Other specified anxiety disorders: Secondary | ICD-10-CM

## 2018-04-08 ENCOUNTER — Other Ambulatory Visit: Payer: Self-pay | Admitting: Family Medicine

## 2018-04-08 ENCOUNTER — Encounter: Payer: Self-pay | Admitting: Cardiology

## 2018-04-09 NOTE — Progress Notes (Signed)
Office: [phone]  /  Fax: [phone]   HPI:   Chief Complaint: OBESITY [name] [name] is here to discuss her progress with her obesity treatment plan. She is on the Category 2 plan and is following her eating plan approximately 40 % of the time. She states she is doing water aerobics and biking for 20 to 30 minutes 3 times per week. [name] [name] continues to do well with weight loss, but she has had increased stress and anxiety and some increased emotional eating. Her weight is 240 lb (108.9 kg) today and has had a weight loss of 2 pounds over a period of 3 weeks since her last visit. She has lost 27 lbs since starting treatment with [place].  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 Her mood improved quickly, after deciding to stop as she was feeling overwhelmed. [name] [name] struggles with emotional eating and using food for comfort to the extent that it is negatively impacting her health. She often snacks when she is not hungry. [name] [name] sometimes feels she is out of control and then feels guilty that she made poor food choices. She has been working on behavior modification techniques to help reduce her emotional eating and has been somewhat successful. She shows no sign of suicidal or homicidal ideations.  Depression screen [facility] 2/9 01/21/2018 01/15/2018 09/17/2017 06/09/2017 01/15/2017  Decreased Interest _0 0  Down, Depressed, Hopeless 1 0 2 - 0  PHQ - 2 Score _1 0  Altered sleeping 1 0 0 0 -  Tired, decreased energy 2 0 3 3 -  Change in appetite 0 0 3 3 -  Feeling bad or failure about yourself  2 0 2 3 -  Trouble concentrating 2 0 3 0 -  Moving slowly or fidgety/restless 2 0 0 0 -  Suicidal thoughts 1 0 0 0 -  PHQ-9 Score _2 -  Difficult doing work/chores Somewhat difficult Not difficult at all Very difficult Not difficult at all -      ALLERGIES: Allergies  Allergen Reactions  . Ancef [Cefazolin] Hives and Itching  .  Pseudoephedrine Hcl     Other reaction(s): palpitations (moderate)  . Caffeine Palpitations    MEDICATIONS: Current Outpatient Medications on File Prior to Visit  Medication Sig Dispense Refill  . apixaban (ELIQUIS) 5 MG TABS tablet Take 1 tablet (5 mg total) by mouth 2 (two) times daily. 180 tablet 1  . Biotin 5000 MCG CAPS Take 5,000 mcg by mouth daily.     . Calcium Carbonate-Vit D-Min (CALCIUM 1200 PO) Take 2,400 mg by mouth daily.     . cholecalciferol (VITAMIN D) 1000 UNITS tablet Take 1,000 Units by mouth daily.     Marland Kitchen Co-Enzyme Q10 100 MG CAPS Take 100 mg by mouth daily.     . diazepam (VALIUM) 5 MG tablet TAKE 1 TABLET BY MOUTH ONCE DAILY AS NEEDED FOR ANXIETY 30 tablet 1  . fluticasone (FLONASE) 50 MCG/ACT nasal spray Place 2 sprays into both nostrils daily. 16 g 6  . folic acid (FOLVITE) 574 MCG tablet Take 400 mcg by mouth daily.    Marland Kitchen glucosamine-chondroitin 500-400 MG tablet Take 1 tablet by mouth daily.     Marland Kitchen loratadine (KLS ALLERCLEAR) 10 MG tablet Take 10 mg by mouth daily.    Marland Kitchen MAGNESIUM CITRATE PO Take 250 mg by mouth daily. 1 tab daily    . metoprolol tartrate (LOPRESSOR) 25  MG tablet Take 0.5 tablets (12.5 mg total) by mouth 2 (two) times daily. 90 tablet 3  . nitroGLYCERIN (NITROSTAT) 0.4 MG SL tablet Place 0.4 mg under the tongue every 5 (five) minutes as needed for chest pain.    Marland Kitchen Resveratrol 250 MG CAPS Take 250 mg by mouth daily.    . sertraline (ZOLOFT) 100 MG tablet TAKE 1 TABLET BY MOUTH ONCE DAILY 90 tablet 0  . Simethicone (GAS-X PO) Take 2 tablets by mouth daily as needed (bloating).     No current facility-administered medications on file prior to visit.     PAST MEDICAL HISTORY: Past Medical History:  Diagnosis Date  . Alpha-1-antitrypsin deficiency carrier ([place])   . Arthritis    "all over"  . Atrial fibrillation ([place])   . Back pain   . Complication of anesthesia    "I woke up during 2 different procedures" (10/04/2014)  . Frequent UTI   . GERD  (gastroesophageal reflux disease)   . Hypertension   . MVP (mitral valve prolapse)   . Obesity   . Osteoarthritis   . Presence of permanent cardiac pacemaker   . PVC's (premature ventricular contractions)   . Shortness of breath   . 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 Sleep apnea   . Stroke [facility]) 01/2014   denies deficits on 10/04/2014  . Tachy-brady syndrome ([place]) 10/2014  . TIA (transient ischemic attack)     PAST SURGICAL HISTORY: Past Surgical History:  Procedure Laterality Date  . CARDIOVERSION N/A 09/02/2017   Procedure: CARDIOVERSION;  Surgeon: Sanda Klein, MD;  Location: [facility] ENDOSCOPY;  Service: Cardiovascular;  Laterality: N/A;  . EXCISION VAGINAL CYST     benign nodules  . INSERT / REPLACE / REMOVE PACEMAKER  10/04/2014   Medtronic Advisa model [id number] serial number [id number]  . LOOP RECORDER EXPLANT N/A 10/04/2014   Procedure: LOOP RECORDER EXPLANT;  Surgeon: Sanda Klein, MD;  Location: [place] CATH LAB;  Service: Cardiovascular;  Laterality: N/A;  . LOOP RECORDER IMPLANT N/A 04/19/2014   Procedure: LOOP RECORDER IMPLANT;  Surgeon: Sanda Klein, MD;  Location: [place] CATH LAB;  Service: Cardiovascular;  Laterality: N/A;  . NM MYOCAR PERF WALL MOTION  12/18/2007   normal  . PERMANENT PACEMAKER INSERTION N/A 10/04/2014   Procedure: PERMANENT PACEMAKER INSERTION;  Surgeon: Sanda Klein, MD;  Location: [place] CATH LAB;  Service: Cardiovascular;  Laterality: N/A;  . TUBAL LIGATION    . US ECHOCARDIOGRAPHY  05/15/2010   LA mildly dilated,mild mitral annular ca+, AOV mildly sclerotic, mild asymmetric LVH    SOCIAL HISTORY: Social History   Tobacco Use  . Smoking status: Former Smoker    Packs/day: 2.00    Years: 18.00    Pack years: 36.00    Types: Cigarettes  . Smokeless tobacco: Never Used  . Tobacco comment: "quit smoking in 1971"  Substance Use Topics  . Alcohol use: Yes    Alcohol/week: 0.0 oz    Comment: 10/04/2014  "might have a drink a couple times/yr"  . Drug use: No    FAMILY HISTORY: Family History  Problem Relation Age of Onset  . Heart attack Mother   . Sudden death Mother   . Depression Mother   . Stroke Father   . Heart failure Father   . Depression Father   . Heart failure Brother   . Stroke Brother   . Alpha-1 antitrypsin deficiency Daughter     ROS: Review of Systems  Constitutional: Positive for weight loss.  Psychiatric/Behavioral: Positive for depression. Negative for suicidal ideas. The patient is nervous/anxious (anxiety).        Positive for Stress     PHYSICAL EXAM: Blood pressure 104/66, pulse 65, temperature 98.2 F (36.8 C), temperature source Oral, height _0  (1.676 m), weight 240 lb (108.9 kg), SpO2 96 %. Body mass index is 38.74 kg/m. Physical Exam  Constitutional: She is oriented to person, place, and time. She appears well-developed and well-nourished.  Cardiovascular: Normal rate.  Pulmonary/Chest: Effort normal.  Musculoskeletal: Normal range of motion.  Neurological: She is oriented to person, place, and time.  Skin: Skin is warm and dry.  Psychiatric: She has a normal mood and affect. Her behavior is normal.  Vitals reviewed.   RECENT LABS AND TESTS: BMET    Component Value Date/Time   NA 142 12/24/2017 1140   K 4.7 12/24/2017 1140   CL 104 12/24/2017 1140   CO2 24 12/24/2017 1140   GLUCOSE 121 (H) 12/24/2017 1140   GLUCOSE 92 01/15/2017 1417   BUN 17 12/24/2017 1140   CREATININE 1.12 (H) 12/24/2017 1140   CREATININE 0.86 09/28/2014 1557   CALCIUM 9.5 12/24/2017 1140   GFRNONAA 47 (L) 12/24/2017 1140   GFRNONAA 56 (L) 06/06/2014 1653   GFRAA 55 (L) 12/24/2017 1140   GFRAA 65 06/06/2014 1653   Lab Results  Component Value Date   HGBA1C 5.8 (H) 12/24/2017   HGBA1C 6.2 (H) 09/17/2017   HGBA1C 5.9 (H) 03/09/2014   Lab Results  Component Value Date   INSULIN 19.4 12/24/2017   INSULIN 47.7 (H) 09/17/2017   CBC    Component Value  Date/Time   WBC 8.5 09/17/2017 1131   WBC 9.1 02/24/2017 1555   RBC 5.22 09/17/2017 1131   RBC 5.09 02/24/2017 1555   HGB 14.9 09/17/2017 1131   HCT 45.1 09/17/2017 1131   PLT 258 08/27/2017 1509   MCV 86 09/17/2017 1131   MCH 28.5 09/17/2017 1131   MCH 29.0 09/28/2014 1557   MCHC 33.0 09/17/2017 1131   MCHC 33.9 02/24/2017 1555   RDW 13.5 09/17/2017 1131   LYMPHSABS 2.0 09/17/2017 1131   MONOABS 1.3 (H) 02/24/2017 1555   EOSABS 0.2 09/17/2017 1131   BASOSABS 0.0 09/17/2017 1131   Iron/TIBC/Ferritin/ %Sat No results found for: IRON, TIBC, FERRITIN, IRONPCTSAT Lipid Panel     Component Value Date/Time   CHOL 164 09/17/2017 1131   TRIG 117 09/17/2017 1131   HDL 47 09/17/2017 1131   CHOLHDL 3 01/15/2017 1417   VLDL 33.6 01/15/2017 1417   LDLCALC 94 09/17/2017 1131   LDLDIRECT 81.0 07/15/2016 1419   Hepatic Function Panel     Component Value Date/Time   PROT 7.6 12/24/2017 1140   ALBUMIN 4.2 12/24/2017 1140   AST 27 12/24/2017 1140   ALT 17 12/24/2017 1140   ALKPHOS 77 12/24/2017 1140   BILITOT 0.5 12/24/2017 1140   BILIDIR 0.1 01/15/2017 1417      Component Value Date/Time   TSH 2.040 09/17/2017 1131   TSH 2.70 07/15/2016 1419   TSH 1.529 06/06/2014 1608   Results for SHRESTA, RISDEN (MRN [id number]) as of 04/09/2018 12:46  Ref. Range 12/24/2017 11:40  Vitamin D, 25-Hydroxy Latest Ref Range: 30.0 - 100.0 ng/mL 46.7   ASSESSMENT AND PLAN: Depression with anxiety  Class 2 severe obesity with serious comorbidity and body mass index (BMI) of 37.0 to 37.9 in adult, unspecified obesity type ([place])  PLAN:  Depression with  Anxiety We discussed signs of anxiety and comfort eating, as well as strategies to avoid being overwhelmed. Patient was encouraged to use valium sparingly, only when she needs it. She has agreed to follow up as directed.  We spent > than 50% of the 30 minute visit on the counseling as documented in the note.  Obesity Alissandra is currently in the  action stage of change. As such, her goal is to continue with weight loss efforts She has agreed to keep a food journal with 1200 to 1300 calories and 70+ grams of protein daily Torianne has been instructed to work up to a goal of 150 minutes of combined cardio and strengthening exercise per week for weight loss and overall health benefits. We discussed the following Behavioral Modification Strategies today: increase H2O intake, keep a strict food journal, increasing vegetables and emotional eating strategies  Margeaux has agreed to follow up with our clinic in 2 to 3 weeks. She was informed of the importance of frequent follow up visits to maximize her success with intensive lifestyle modifications for her multiple health conditions.   OBESITY BEHAVIORAL INTERVENTION VISIT  Today's visit was # 13 out of 22.  Starting weight: 267 lbs Starting date: 09/17/17 Today's weight : 240 lbs  Today's date: 04/07/2018 Total lbs lost to date: 15 (Patients must lose 7 lbs in the first 6 months to continue with counseling)   ASK: We discussed the diagnosis of obesity with [name] [name] today and Avelina agreed to give [place] permission to discuss obesity behavioral modification therapy today.  ASSESS: Azelea has the diagnosis of obesity and her BMI today is 38.76 Lilu is in the action stage of change   ADVISE: Berlene was educated on the multiple health risks of obesity as well as the benefit of weight loss to improve her health. She was advised of the need for long term treatment and the importance of lifestyle modifications.  AGREE: Multiple dietary modification options and treatment options were discussed and  Haru agreed to the above obesity treatment plan.  I, Doreene Nest, am acting as transcriptionist for Dennard Nip, MD  I have reviewed the above documentation for accuracy and completeness, and I agree with the above. -Dennard Nip, MD

## 2018-04-17 DIAGNOSIS — M160 Bilateral primary osteoarthritis of hip: Secondary | ICD-10-CM | POA: Diagnosis not present

## 2018-04-24 LAB — CUP PACEART REMOTE DEVICE CHECK
Battery Voltage: 3.01 V
Brady Statistic AP VP Percent: 0.04 %
Brady Statistic AP VS Percent: 96 %
Brady Statistic AS VS Percent: 3.85 %
Brady Statistic RA Percent Paced: 95.72 %
Brady Statistic RV Percent Paced: 0.16 %
Implantable Lead Implant Date: 20151103
Implantable Lead Location: 753859
Implantable Lead Model: 5076
Implantable Pulse Generator Implant Date: 20151103
Lead Channel Impedance Value: 456 Ohm
Lead Channel Impedance Value: 513 Ohm
Lead Channel Impedance Value: 532 Ohm
Lead Channel Pacing Threshold Amplitude: 0.5 V
Lead Channel Sensing Intrinsic Amplitude: 8.875 mV
Lead Channel Sensing Intrinsic Amplitude: 8.875 mV
Lead Channel Setting Pacing Amplitude: 1.5 V
Lead Channel Setting Pacing Amplitude: 2 V
Lead Channel Setting Pacing Pulse Width: 0.4 ms
Lead Channel Setting Sensing Sensitivity: 2.8 mV
MDC IDC LEAD IMPLANT DT: 20151103
MDC IDC LEAD LOCATION: 753860
MDC IDC MSMT BATTERY REMAINING LONGEVITY: 84 mo
MDC IDC MSMT LEADCHNL RA IMPEDANCE VALUE: 437 Ohm
MDC IDC MSMT LEADCHNL RA PACING THRESHOLD AMPLITUDE: 0.5 V
MDC IDC MSMT LEADCHNL RA PACING THRESHOLD PULSEWIDTH: 0.4 ms
MDC IDC MSMT LEADCHNL RA SENSING INTR AMPL: 5.125 mV
MDC IDC MSMT LEADCHNL RA SENSING INTR AMPL: 5.125 mV
MDC IDC MSMT LEADCHNL RV PACING THRESHOLD PULSEWIDTH: 0.4 ms
MDC IDC SESS DTM: 20190506142656
MDC IDC STAT BRADY AS VP PERCENT: 0.11 %

## 2018-04-29 ENCOUNTER — Ambulatory Visit (INDEPENDENT_AMBULATORY_CARE_PROVIDER_SITE_OTHER): Payer: Medicare Other | Admitting: Family Medicine

## 2018-04-29 VITALS — BP 106/63 | HR 71 | Temp 97.9°F | Ht 66.0 in | Wt 239.0 lb

## 2018-04-29 DIAGNOSIS — Z6838 Body mass index (BMI) 38.0-38.9, adult: Secondary | ICD-10-CM | POA: Diagnosis not present

## 2018-04-29 DIAGNOSIS — I10 Essential (primary) hypertension: Secondary | ICD-10-CM | POA: Diagnosis not present

## 2018-04-29 NOTE — Progress Notes (Signed)
Office: [phone]  /  Fax: [phone]   HPI:   Chief Complaint: OBESITY [name] [name] is here to discuss her progress with her obesity treatment plan. She is on the keep a food journal with 1200-1300 calories and 70+ grams of protein daily and is following her eating plan approximately 50 % of the time. She states she is swimming, bicycling, and on the elliptical for 30 minutes 3 times per week. Kirti continues to do well with weight loss and is doing better meeting her protein goals. She has had some increased in simple carbohydrates over holidays but has started back on track now.  Her weight is 239 lb (108.4 kg) today and has had a weight loss of 1 pounds over a period of 3 weeks since her last visit. She has lost 28 lbs since starting treatment with [place].  Hypertension [name] [name] is a 77 y.o. female with hypertension. Albirtha's blood pressure is well controlled on metoprolol, she denies chest pain or headache. She is working on diet, exercise, and weight loss as well to help control her blood pressure with the goal of decreasing her risk of heart attack and stroke.  ALLERGIES: Allergies  Allergen Reactions  . Ancef [Cefazolin] Hives and Itching  . Pseudoephedrine Hcl     Other reaction(s): palpitations (moderate)  . Caffeine Palpitations    MEDICATIONS: Current Outpatient Medications on File Prior to Visit  Medication Sig Dispense Refill  . apixaban (ELIQUIS) 5 MG TABS tablet Take 1 tablet (5 mg total) by mouth 2 (two) times daily. 180 tablet 1  . Biotin 5000 MCG CAPS Take 5,000 mcg by mouth daily.     . Calcium Carbonate-Vit D-Min (CALCIUM 1200 PO) Take 2,400 mg by mouth daily.     . cholecalciferol (VITAMIN D) 1000 UNITS tablet Take 1,000 Units by mouth daily.     Marland Kitchen Co-Enzyme Q10 100 MG CAPS Take 100 mg by mouth daily.     . diazepam (VALIUM) 5 MG tablet TAKE 1 TABLET BY MOUTH ONCE DAILY AS NEEDED FOR ANXIETY 30 tablet 1  . fluticasone (FLONASE) 50 MCG/ACT nasal spray Place 2  sprays into both nostrils daily. 16 g 6  . folic acid (FOLVITE) 915 MCG tablet Take 400 mcg by mouth daily.    Marland Kitchen glucosamine-chondroitin 500-400 MG tablet Take 1 tablet by mouth daily.     Marland Kitchen loratadine (KLS ALLERCLEAR) 10 MG tablet Take 10 mg by mouth daily.    Marland Kitchen MAGNESIUM CITRATE PO Take 250 mg by mouth daily. 1 tab daily    . metoprolol tartrate (LOPRESSOR) 25 MG tablet Take 0.5 tablets (12.5 mg total) by mouth 2 (two) times daily. 90 tablet 3  . nitroGLYCERIN (NITROSTAT) 0.4 MG SL tablet Place 0.4 mg under the tongue every 5 (five) minutes as needed for chest pain.    . pantoprazole (PROTONIX) 40 MG tablet TAKE 1 TABLET BY MOUTH DAILY 90 tablet 1  . Resveratrol 250 MG CAPS Take 250 mg by mouth daily.    . sertraline (ZOLOFT) 100 MG tablet TAKE 1 TABLET BY MOUTH ONCE DAILY 90 tablet 0  . Simethicone (GAS-X PO) Take 2 tablets by mouth daily as needed (bloating).     No current facility-administered medications on file prior to visit.     PAST MEDICAL HISTORY: Past Medical History:  Diagnosis Date  . Alpha-1-antitrypsin deficiency carrier ([place])   . Arthritis    "all over"  . Atrial fibrillation ([place])   . Back pain   .  Complication of anesthesia    "I woke up during 2 different procedures" (10/04/2014)  . Frequent UTI   . GERD (gastroesophageal reflux disease)   . Hypertension   . MVP (mitral valve prolapse)   . Obesity   . Osteoarthritis   . Presence of permanent cardiac pacemaker   . PVC's (premature ventricular contractions)   . Shortness of breath   . 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 Sleep apnea   . Stroke [facility]) 01/2014   denies deficits on 10/04/2014  . Tachy-brady syndrome ([place]) 10/2014  . TIA (transient ischemic attack)     PAST SURGICAL HISTORY: Past Surgical History:  Procedure Laterality Date  . CARDIOVERSION N/A 09/02/2017   Procedure: CARDIOVERSION;  Surgeon: Sanda Klein, MD;  Location: [facility] ENDOSCOPY;   Service: Cardiovascular;  Laterality: N/A;  . EXCISION VAGINAL CYST     benign nodules  . INSERT / REPLACE / REMOVE PACEMAKER  10/04/2014   Medtronic Advisa model [id number] serial number [id number]  . LOOP RECORDER EXPLANT N/A 10/04/2014   Procedure: LOOP RECORDER EXPLANT;  Surgeon: Sanda Klein, MD;  Location: [place] CATH LAB;  Service: Cardiovascular;  Laterality: N/A;  . LOOP RECORDER IMPLANT N/A 04/19/2014   Procedure: LOOP RECORDER IMPLANT;  Surgeon: Sanda Klein, MD;  Location: [place] CATH LAB;  Service: Cardiovascular;  Laterality: N/A;  . NM MYOCAR PERF WALL MOTION  12/18/2007   normal  . PERMANENT PACEMAKER INSERTION N/A 10/04/2014   Procedure: PERMANENT PACEMAKER INSERTION;  Surgeon: Sanda Klein, MD;  Location: [place] CATH LAB;  Service: Cardiovascular;  Laterality: N/A;  . TUBAL LIGATION    . US ECHOCARDIOGRAPHY  05/15/2010   LA mildly dilated,mild mitral annular ca+, AOV mildly sclerotic, mild asymmetric LVH    SOCIAL HISTORY: Social History   Tobacco Use  . Smoking status: Former Smoker    Packs/day: 2.00    Years: 18.00    Pack years: 36.00    Types: Cigarettes  . Smokeless tobacco: Never Used  . Tobacco comment: "quit smoking in 1971"  Substance Use Topics  . Alcohol use: Yes    Alcohol/week: 0.0 oz    Comment: 10/04/2014 "might have a drink a couple times/yr"  . Drug use: No    FAMILY HISTORY: Family History  Problem Relation Age of Onset  . Heart attack Mother   . Sudden death Mother   . Depression Mother   . Stroke Father   . Heart failure Father   . Depression Father   . Heart failure Brother   . Stroke Brother   . Alpha-1 antitrypsin deficiency Daughter     ROS: Review of Systems  Constitutional: Positive for weight loss.  Cardiovascular: Negative for chest pain.  Neurological: Negative for headaches.    PHYSICAL EXAM: Blood pressure 106/63, pulse 71, temperature 97.9 F (36.6 C), temperature source Oral, height _0  (1.676 m), weight 239 lb (108.4  kg), SpO2 97 %. Body mass index is 38.58 kg/m. Physical Exam  Constitutional: She is oriented to person, place, and time. She appears well-developed and well-nourished.  Cardiovascular: Normal rate.  Pulmonary/Chest: Effort normal.  Musculoskeletal: Normal range of motion.  Neurological: She is oriented to person, place, and time.  Skin: Skin is warm and dry.  Psychiatric: She has a normal mood and affect. Her behavior is normal.  Vitals reviewed.   RECENT LABS AND TESTS: BMET    Component Value Date/Time   NA 142 12/24/2017 1140   K 4.7  12/24/2017 1140   CL 104 12/24/2017 1140   CO2 24 12/24/2017 1140   GLUCOSE 121 (H) 12/24/2017 1140   GLUCOSE 92 01/15/2017 1417   BUN 17 12/24/2017 1140   CREATININE 1.12 (H) 12/24/2017 1140   CREATININE 0.86 09/28/2014 1557   CALCIUM 9.5 12/24/2017 1140   GFRNONAA 47 (L) 12/24/2017 1140   GFRNONAA 56 (L) 06/06/2014 1653   GFRAA 55 (L) 12/24/2017 1140   GFRAA 65 06/06/2014 1653   Lab Results  Component Value Date   HGBA1C 5.8 (H) 12/24/2017   HGBA1C 6.2 (H) 09/17/2017   HGBA1C 5.9 (H) 03/09/2014   Lab Results  Component Value Date   INSULIN 19.4 12/24/2017   INSULIN 47.7 (H) 09/17/2017   CBC    Component Value Date/Time   WBC 8.5 09/17/2017 1131   WBC 9.1 02/24/2017 1555   RBC 5.22 09/17/2017 1131   RBC 5.09 02/24/2017 1555   HGB 14.9 09/17/2017 1131   HCT 45.1 09/17/2017 1131   PLT 258 08/27/2017 1509   MCV 86 09/17/2017 1131   MCH 28.5 09/17/2017 1131   MCH 29.0 09/28/2014 1557   MCHC 33.0 09/17/2017 1131   MCHC 33.9 02/24/2017 1555   RDW 13.5 09/17/2017 1131   LYMPHSABS 2.0 09/17/2017 1131   MONOABS 1.3 (H) 02/24/2017 1555   EOSABS 0.2 09/17/2017 1131   BASOSABS 0.0 09/17/2017 1131   Iron/TIBC/Ferritin/ %Sat No results found for: IRON, TIBC, FERRITIN, IRONPCTSAT Lipid Panel     Component Value Date/Time   CHOL 164 09/17/2017 1131   TRIG 117 09/17/2017 1131   HDL 47 09/17/2017 1131   CHOLHDL 3 01/15/2017  1417   VLDL 33.6 01/15/2017 1417   LDLCALC 94 09/17/2017 1131   LDLDIRECT 81.0 07/15/2016 1419   Hepatic Function Panel     Component Value Date/Time   PROT 7.6 12/24/2017 1140   ALBUMIN 4.2 12/24/2017 1140   AST 27 12/24/2017 1140   ALT 17 12/24/2017 1140   ALKPHOS 77 12/24/2017 1140   BILITOT 0.5 12/24/2017 1140   BILIDIR 0.1 01/15/2017 1417      Component Value Date/Time   TSH 2.040 09/17/2017 1131   TSH 2.70 07/15/2016 1419   TSH 1.529 06/06/2014 1608    ASSESSMENT AND PLAN: Essential hypertension  Class 2 severe obesity with serious comorbidity and body mass index (BMI) of 38.0 to 38.9 in adult, unspecified obesity type (HCC)  PLAN:  Hypertension We discussed sodium restriction, working on healthy weight loss, and a regular exercise program as the means to achieve improved blood pressure control. Nashia agreed with this plan and agreed to follow up as directed. We will continue to monitor her blood pressure as well as her progress with the above lifestyle modifications. She will continue diet, exercise, and metoprolol and will follow closely. Goal is to decrease medications over time. She will watch for signs of hypotension as she continues her lifestyle modifications. Xaniyah agrees to follow up with our clinic in 2 to 3 weeks.  We spent > than 50% of the 15 minute visit on the counseling as documented in the note.  Obesity Dao is currently in the action stage of change. As such, her goal is to continue with weight loss efforts She has agreed to keep a food journal with 1200-1300 calories and 70+ grams of protein daily Seanne has been instructed to work up to a goal of 150 minutes of combined cardio and strengthening exercise per week for weight loss and overall health benefits. We discussed  the following Behavioral Modification Strategies today: increasing lean protein intake, decreasing simple carbohydrates  and work on meal planning and easy cooking  plans   Jackilyn has agreed to follow up with our clinic in 2 to 3 weeks. She was informed of the importance of frequent follow up visits to maximize her success with intensive lifestyle modifications for her multiple health conditions.   OBESITY BEHAVIORAL INTERVENTION VISIT  Today's visit was # 14 out of 22.  Starting weight: 267 lbs Starting date: 09/17/17 Today's weight : 239 lbs  Today's date: 04/29/2018 Total lbs lost to date: 42 (Patients must lose 7 lbs in the first 6 months to continue with counseling)   ASK: We discussed the diagnosis of obesity with [name] today and Daleigh agreed to give [place] permission to discuss obesity behavioral modification therapy today.  ASSESS: Sheronda has the diagnosis of obesity and her BMI today is 38.59 Kamesha is in the action stage of change   ADVISE: Carreen was educated on the multiple health risks of obesity as well as the benefit of weight loss to improve her health. She was advised of the need for long term treatment and the importance of lifestyle modifications.  AGREE: Multiple dietary modification options and treatment options were discussed and  Haruna agreed to the above obesity treatment plan.  I, Trixie Dredge, am acting as transcriptionist for Dennard Nip, MD  I have reviewed the above documentation for accuracy and completeness, and I agree with the above. -Dennard Nip, MD

## 2018-05-07 ENCOUNTER — Other Ambulatory Visit: Payer: Self-pay

## 2018-05-07 ENCOUNTER — Ambulatory Visit (INDEPENDENT_AMBULATORY_CARE_PROVIDER_SITE_OTHER): Payer: Medicare Other | Admitting: Family Medicine

## 2018-05-07 ENCOUNTER — Encounter: Payer: Self-pay | Admitting: Family Medicine

## 2018-05-07 VITALS — BP 130/70 | HR 85 | Temp 97.8°F | Ht 66.0 in | Wt 244.0 lb

## 2018-05-07 DIAGNOSIS — M150 Primary generalized (osteo)arthritis: Secondary | ICD-10-CM

## 2018-05-07 DIAGNOSIS — M159 Polyosteoarthritis, unspecified: Secondary | ICD-10-CM

## 2018-05-07 DIAGNOSIS — M1611 Unilateral primary osteoarthritis, right hip: Secondary | ICD-10-CM | POA: Diagnosis not present

## 2018-05-07 MED ORDER — DICLOFENAC SODIUM 1 % TD GEL: 2.0000 g | Freq: Four times a day (QID) | TRANSDERMAL | 3 refills | Status: DC | PRN

## 2018-05-07 NOTE — Patient Instructions (Signed)
Please return in 4 weeks with Dr. Birdie Riddle to follow up on your pain.   If you have any questions or concerns, please don't hesitate to send me a message via MyChart or call the office at [phone]. Thank you for visiting with [place] today! It's our pleasure caring for you.

## 2018-05-07 NOTE — Progress Notes (Signed)
Subjective  CC:  Chief Complaint  Patient presents with  . Arthritis    Patient states she is having pain in her knees, hips and back     HPI: [name] [name] is a 77 y.o. female who presents to the office today to address the problems listed above in the chief complaint.  Seen at weight loss center and c/o joint pain; told may have RA and would like a referral to Rheum  Has known YQ:MVHQI, back, hips. Sees ortho. Had steroid injection in left hip today by ortho.  Feels tired and has upper and lower back pain and hand pain.    Assessment  1. Primary osteoarthritis involving multiple joints      Plan   OA:  C/w OA; f/u with ortho. voltaren gel for hand pain ordered. Educated on RA vs OA. F/u with PCP to discuss further.   Follow up: 2 weeks    No orders of the defined types were placed in this encounter.  Meds ordered this encounter  Medications  . diclofenac sodium (VOLTAREN) 1 % GEL    Sig: Apply 2 g topically 4 (four) times daily as needed.    Dispense:  200 g    Refill:  3      I reviewed the patients updated PMH, FH, and SocHx.    Patient Active Problem List   Diagnosis Date Noted  . Prediabetes 11/05/2017  . Vitamin D deficiency 09/17/2017  . Class 3 severe obesity with serious comorbidity and body mass index (BMI) of 40.0 to 44.9 in adult ([place]) 09/17/2017  . Hypertriglyceridemia 09/17/2017  . Persistent atrial fibrillation ([place])   . Multiple lung nodules 05/23/2017  . Pulmonary fibrosis ([place]) 05/23/2017  . 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 Abnormal CT scan of lung 01/07/2017  . Chest pain with moderate risk of acute coronary syndrome 12/12/2016  . GERD (gastroesophageal reflux disease) 12/12/2016  . Dyspnea on exertion 12/10/2016  . Anxiety state 07/15/2016  . OSA on CPAP 05/04/2016  . Sinus arrest 10/04/2014  . Pacemaker 10/04/2014  . Long term current use of anticoagulant 06/27/2014  . PAF  (paroxysmal atrial fibrillation) ([place]) 06/06/2014  . Tachy-brady syndrome ([place]) 06/06/2014  . TIA (transient ischemic attack) 03/08/2014  . Disorder of bone and cartilage 08/15/2008   Current Meds  Medication Sig  . apixaban (ELIQUIS) 5 MG TABS tablet Take 1 tablet (5 mg total) by mouth 2 (two) times daily.  . Biotin 5000 MCG CAPS Take 5,000 mcg by mouth daily.   . Calcium Carbonate-Vit D-Min (CALCIUM 1200 PO) Take 2,400 mg by mouth daily.   . cholecalciferol (VITAMIN D) 1000 UNITS tablet Take 1,000 Units by mouth daily.   Marland Kitchen Co-Enzyme Q10 100 MG CAPS Take 100 mg by mouth daily.   . diazepam (VALIUM) 5 MG tablet TAKE 1 TABLET BY MOUTH ONCE DAILY AS NEEDED FOR ANXIETY  . fluticasone (FLONASE) 50 MCG/ACT nasal spray Place 2 sprays into both nostrils daily.  . folic acid (FOLVITE) 696 MCG tablet Take 400 mcg by mouth daily.  Marland Kitchen glucosamine-chondroitin 500-400 MG tablet Take 1 tablet by mouth daily.   Marland Kitchen loratadine (KLS ALLERCLEAR) 10 MG tablet Take 10 mg by mouth daily.  Marland Kitchen MAGNESIUM CITRATE PO Take 250 mg by mouth daily. 1 tab daily  . metoprolol tartrate (LOPRESSOR) 25 MG tablet Take 0.5 tablets (12.5 mg total) by mouth 2 (two) times daily.  . nitroGLYCERIN (NITROSTAT) 0.4 MG SL  tablet Place 0.4 mg under the tongue every 5 (five) minutes as needed for chest pain.  . pantoprazole (PROTONIX) 40 MG tablet TAKE 1 TABLET BY MOUTH DAILY  . Resveratrol 250 MG CAPS Take 250 mg by mouth daily.  . sertraline (ZOLOFT) 100 MG tablet TAKE 1 TABLET BY MOUTH ONCE DAILY  . Simethicone (GAS-X PO) Take 2 tablets by mouth daily as needed (bloating).    Allergies: Patient is allergic to ancef [cefazolin]; pseudoephedrine hcl; and caffeine. Family History: Patient family history includes Alpha-1 antitrypsin deficiency in her daughter; Depression in her father and mother; Heart attack in her mother; Heart failure in her brother and father; Stroke in her brother and father; Sudden death in her mother. Social  History:  Patient  reports that she has quit smoking. Her smoking use included cigarettes. She has a 36.00 pack-year smoking history. She has never used smokeless tobacco. She reports that she drinks alcohol. She reports that she does not use drugs.  Review of Systems: Constitutional: Negative for fever malaise or anorexia Cardiovascular: negative for chest pain Respiratory: negative for SOB or persistent cough Gastrointestinal: negative for abdominal pain  Objective  Vitals: BP 130/70   Pulse 85   Temp 97.8 F (36.6 C)   Ht 5' 6" (1.676 m)   Wt 244 lb (110.7 kg)   BMI 39.38 kg/m  General: no acute distress , A&Ox3 Hands: 2nd and 3rd right  DIP is tender and swollen, no mcp or PIP swelling or pain, nl gait Skin:  Warm, no rashes     Commons side effects, risks, benefits, and alternatives for medications and treatment plan prescribed today were discussed, and the patient expressed understanding of the given instructions. Patient is instructed to call or message via MyChart if he/she has any questions or concerns regarding our treatment plan. No barriers to understanding were identified. We discussed Red Flag symptoms and signs in detail. Patient expressed understanding regarding what to do in case of urgent or emergency type symptoms.   Medication list was reconciled, printed and provided to the patient in AVS. Patient instructions and summary information was reviewed with the patient as documented in the AVS. This note was prepared with assistance of Dragon voice recognition software. Occasional wrong-word or sound-a-like substitutions may have occurred due to the inherent limitations of voice recognition software

## 2018-05-08 ENCOUNTER — Telehealth: Payer: Self-pay | Admitting: *Deleted

## 2018-05-08 NOTE — Telephone Encounter (Signed)
PA started for Diclofenac through Cover My Meds  Key: [place]  Will update with decision from insurance.

## 2018-05-08 NOTE — Telephone Encounter (Signed)
Medication approved.   Information has been faxed to pharmacy.

## 2018-05-11 ENCOUNTER — Other Ambulatory Visit: Payer: Self-pay | Admitting: Family Medicine

## 2018-05-11 NOTE — Telephone Encounter (Signed)
Last OV: 01/15/2018 Last Fill: 10/04/2017

## 2018-05-20 ENCOUNTER — Ambulatory Visit (INDEPENDENT_AMBULATORY_CARE_PROVIDER_SITE_OTHER): Payer: Medicare Other | Admitting: Family Medicine

## 2018-05-28 ENCOUNTER — Telehealth: Payer: Self-pay | Admitting: Internal Medicine

## 2018-05-28 NOTE — Telephone Encounter (Signed)
LMOM TCB x1

## 2018-05-28 NOTE — Telephone Encounter (Signed)
Pt states she is set to go out of town and unable to come in for apt. Currently taken antihistamine BID and nasal spray. Also taking Corcidin HBP OTC. Pt would like to Prednisone Rx to take with her.  Dr.Mannam, please advise as CY if out of the office.     Pharmacy [facility]

## 2018-05-28 NOTE — Telephone Encounter (Signed)
Patient states she will be leaving soon and asks for [place] to call her back at [phone].

## 2018-05-28 NOTE — Telephone Encounter (Signed)
Attempted to call patient today regarding PM recommendations. I did not receive an answer at time of call. I have left a voicemail message for pt to return call. X2

## 2018-05-28 NOTE — Telephone Encounter (Signed)
Okay to call in prednisone starting at 40 mg.  Reduce dose by 10 mg every 3 days.

## 2018-05-28 NOTE — Telephone Encounter (Signed)
Attempted to call patient today regarding prior message below. I did not receive an answer at time of call. I have left a voicemail message for pt to return call. X1  Pt states she is set to go out of town and unable to come in for apt.  Currently taken antihistamine BID and nasal spray.  Also taking Corcidin HBP OTC. Pt would like to Prednisone Rx to take with her.  Dr.Mannam, please advise as CY if out of the office.

## 2018-05-29 MED ORDER — PREDNISONE 10 MG PO TABS: ORAL_TABLET | ORAL | 0 refills | Status: DC

## 2018-05-29 NOTE — Telephone Encounter (Signed)
Spoke with patient-aware of Rx from [place] and called to pharmacy STAT as patient is heading out of town. Nothing more needed at this time.

## 2018-06-02 ENCOUNTER — Ambulatory Visit (INDEPENDENT_AMBULATORY_CARE_PROVIDER_SITE_OTHER): Payer: Medicare Other | Admitting: Family Medicine

## 2018-06-03 ENCOUNTER — Ambulatory Visit (INDEPENDENT_AMBULATORY_CARE_PROVIDER_SITE_OTHER): Payer: Medicare Other | Admitting: Family Medicine

## 2018-06-03 VITALS — BP 109/73 | HR 82 | Temp 97.6°F | Ht 66.0 in | Wt 235.0 lb

## 2018-06-03 DIAGNOSIS — Z6838 Body mass index (BMI) 38.0-38.9, adult: Secondary | ICD-10-CM | POA: Diagnosis not present

## 2018-06-03 DIAGNOSIS — R7303 Prediabetes: Secondary | ICD-10-CM

## 2018-06-03 DIAGNOSIS — E559 Vitamin D deficiency, unspecified: Secondary | ICD-10-CM | POA: Diagnosis not present

## 2018-06-03 DIAGNOSIS — E7849 Other hyperlipidemia: Secondary | ICD-10-CM

## 2018-06-04 LAB — LIPID PANEL WITH LDL/HDL RATIO
Cholesterol, Total: 176 mg/dL (ref 100–199)
HDL: 53 mg/dL (ref >39)
LDL CALC: 100 mg/dL — AB (ref 0–99)
LDl/HDL Ratio: 1.9 ratio (ref 0.0–3.2)
Triglycerides: 113 mg/dL (ref 0–149)
VLDL CHOLESTEROL CAL: 23 mg/dL (ref 5–40)

## 2018-06-04 LAB — COMPREHENSIVE METABOLIC PANEL
A/G RATIO: 1.3 (ref 1.2–2.2)
ALK PHOS: 84 IU/L (ref 39–117)
ALT: 16 IU/L (ref 0–32)
AST: 24 IU/L (ref 0–40)
Albumin: 3.9 g/dL (ref 3.5–4.8)
BILIRUBIN TOTAL: 0.3 mg/dL (ref 0.0–1.2)
BUN / CREAT RATIO: 19 (ref 12–28)
BUN: 20 mg/dL (ref 8–27)
CALCIUM: 9.3 mg/dL (ref 8.7–10.3)
CO2: 23 mmol/L (ref 20–29)
Chloride: 104 mmol/L (ref 96–106)
Creatinine, Ser: 1.06 mg/dL — ABNORMAL HIGH (ref 0.57–1.00)
GFR calc Af Amer: 59 mL/min/{1.73_m2} — ABNORMAL LOW (ref >59)
GFR calc non Af Amer: 51 mL/min/{1.73_m2} — ABNORMAL LOW (ref >59)
Globulin, Total: 2.9 g/dL (ref 1.5–4.5)
Glucose: 110 mg/dL — ABNORMAL HIGH (ref 65–99)
POTASSIUM: 4.5 mmol/L (ref 3.5–5.2)
Sodium: 142 mmol/L (ref 134–144)
Total Protein: 6.8 g/dL (ref 6.0–8.5)

## 2018-06-04 LAB — INSULIN, RANDOM: INSULIN: 17.6 u[IU]/mL (ref 2.6–24.9)

## 2018-06-04 LAB — VITAMIN D 25 HYDROXY (VIT D DEFICIENCY, FRACTURES): VIT D 25 HYDROXY: 53.1 ng/mL (ref 30.0–100.0)

## 2018-06-04 LAB — HEMOGLOBIN A1C
Est. average glucose Bld gHb Est-mCnc: 120 mg/dL
HEMOGLOBIN A1C: 5.8 % — AB (ref 4.8–5.6)

## 2018-06-08 NOTE — Progress Notes (Signed)
Office: [phone]  /  Fax: [phone]   HPI:   Chief Complaint: OBESITY Jezebel is here to discuss her progress with her obesity treatment plan. She is on the keep a food journal with 1200 to 1300 calories and 70+ grams of protein daily and is following her eating plan approximately 20 % of the time. She states she is swimming 30 minutes 1 time per week. Sylvester continues to do well with weight loss. She was mindful of her food choices and is doing well with controlling her portions. She has increased H2O intake and vegetables. Her weight is 235 lb (106.6 kg) today and has had a weight loss of 4 pounds over a period of 5 weeks since her last visit. She has lost 32 lbs since starting treatment with [place].  Vitamin D deficiency Kaylan has a diagnosis of vitamin D deficiency. She is currently taking OTC vit D 1,000 IU daily and is due for labs. Blayke denies nausea, vomiting or muscle weakness.  Pre-Diabetes Latasia has a diagnosis of prediabetes based on her elevated Hgb A1c and was informed this puts her at greater risk of developing diabetes. She is not taking metformin currently. Zakayla is stable on diet and she continues to work on diet and exercise to decrease risk of diabetes. Anyah has decreased polyphagia and she denies nausea, vomiting or hypoglycemia.  Hyperlipidemia Joselyne has hyperlipidemia and is not on statin. She has been attempting to control her cholesterol levels with intensive lifestyle modification including a low saturated fat diet, exercise and weight loss. She denies any chest pain, claudication or myalgias.  ALLERGIES: Allergies  Allergen Reactions  . Ancef [Cefazolin] Hives and Itching  . Pseudoephedrine Hcl     Other reaction(s): palpitations (moderate)  . Caffeine Palpitations    MEDICATIONS: Current Outpatient Medications on File Prior to Visit  Medication Sig Dispense Refill  . apixaban (ELIQUIS) 5 MG TABS tablet Take 1 tablet (5 mg total) by mouth 2  (two) times daily. 180 tablet 1  . Biotin 5000 MCG CAPS Take 5,000 mcg by mouth daily.     . Calcium Carbonate-Vit D-Min (CALCIUM 1200 PO) Take 2,400 mg by mouth daily.     . cholecalciferol (VITAMIN D) 1000 UNITS tablet Take 1,000 Units by mouth daily.     Marland Kitchen Co-Enzyme Q10 100 MG CAPS Take 100 mg by mouth daily.     . diazepam (VALIUM) 5 MG tablet TAKE 1 TABLET BY MOUTH ONCE DAILY AS NEEDED FOR ANXIETY 30 tablet 1  . diclofenac sodium (VOLTAREN) 1 % GEL Apply 2 g topically 4 (four) times daily as needed. 200 g 3  . fluticasone (FLONASE) 50 MCG/ACT nasal spray Place 2 sprays into both nostrils daily. 16 g 6  . folic acid (FOLVITE) 300 MCG tablet Take 400 mcg by mouth daily.    Marland Kitchen glucosamine-chondroitin 500-400 MG tablet Take 1 tablet by mouth daily.     Marland Kitchen loratadine (KLS ALLERCLEAR) 10 MG tablet Take 10 mg by mouth daily.    Marland Kitchen MAGNESIUM CITRATE PO Take 250 mg by mouth daily. 1 tab daily    . metoprolol tartrate (LOPRESSOR) 25 MG tablet Take 0.5 tablets (12.5 mg total) by mouth 2 (two) times daily. 90 tablet 3  . nitroGLYCERIN (NITROSTAT) 0.4 MG SL tablet Place 0.4 mg under the tongue every 5 (five) minutes as needed for chest pain.    . pantoprazole (PROTONIX) 40 MG tablet TAKE 1 TABLET BY MOUTH DAILY 90 tablet 1  . predniSONE (DELTASONE)  10 MG tablet Take 18m x 3 days,340mx 3 days, 2018m 3 days, 51m25m3 days 30 tablet 0  . Resveratrol 250 MG CAPS Take 250 mg by mouth daily.    . sertraline (ZOLOFT) 100 MG tablet TAKE 1 TABLET BY MOUTH ONCE DAILY 90 tablet 0  . Simethicone (GAS-X PO) Take 2 tablets by mouth daily as needed (bloating).     No current facility-administered medications on file prior to visit.     PAST MEDICAL HISTORY: Past Medical History:  Diagnosis Date  . Alpha-1-antitrypsin deficiency carrier (HCC)[place]. Arthritis    "all over"  . Atrial fibrillation (HCC)[place]. Back pain   . Complication of anesthesia    "I woke up during 2 different procedures" (10/04/2014)  .  Frequent UTI   . GERD (gastroesophageal reflux disease)   . Hypertension   . MVP (mitral valve prolapse)   . Obesity   . Osteoarthritis   . Presence of permanent cardiac pacemaker   . PVC's (premature ventricular contractions)   . Shortness of breath   . Sinus arrest 10/2014   s/p Medtronic Advisa model [id number] number [id number]. Sleep apnea   . [facility]   denies deficits on 10/04/2014  . Tachy-brady syndrome (HCC)[place]/2015  . TIA (transient ischemic attack)     PAST SURGICAL HISTORY: Past Surgical History:  Procedure Laterality Date  . CARDIOVERSION N/A 09/02/2017   Procedure: CARDIOVERSION;  Surgeon: CroiSanda Klein;  Location: [facility];  Service: Cardiovascular;  Laterality: N/A;  . EXCISION VAGINAL CYST     benign nodules  . INSERT / REPLACE / REMOVE PACEMAKER  10/04/2014   Medtronic Advisa model [id number] number [id number]. LOOP RECORDER EXPLANT N/A 10/04/2014   Procedure: LOOP RECORDER EXPLANT;  Surgeon: MihaSanda Klein;  [facility];  Service: Cardiovascular;  Laterality: N/A;  . LOOP RECORDER IMPLANT N/A 04/19/2014   Procedure: LOOP RECORDER IMPLANT;  Surgeon: MihaSanda Klein;  [facility];  Service: Cardiovascular;  Laterality: N/A;  . NM MYOCAR PERF WALL MOTION  12/18/2007   normal  . PERMANENT PACEMAKER INSERTION N/A 10/04/2014   Procedure: PERMANENT PACEMAKER INSERTION;  Surgeon: MihaSanda Klein;  [facility];  Service: Cardiovascular;  Laterality: N/A;  . TUBAL LIGATION    . [place]  05/15/2010   LA mildly dilated,mild mitral annular ca+, AOV mildly sclerotic, mild asymmetric LVH    SOCIAL HISTORY: Social History   Tobacco Use  . Smoking status: Former Smoker    Packs/day: 2.00    Years: 18.00    Pack years: 36.00    Types: Cigarettes  . Smokeless tobacco: Never Used  . Tobacco comment: "quit smoking in 1971"  Substance Use Topics  . Alcohol use: Yes    Alcohol/week: 0.0 oz     Comment: 10/04/2014 "might have a drink a couple times/yr"  . Drug use: No    FAMILY HISTORY: Family History  Problem Relation Age of Onset  . Heart attack Mother   . Sudden death Mother   . Depression Mother   . Stroke Father   . Heart failure Father   . Depression Father   . Heart failure Brother   . Stroke Brother   . Alpha-1 antitrypsin deficiency Daughter     ROS: Review of Systems  Constitutional: Positive for weight loss.  Cardiovascular: Negative for chest pain and claudication.  Gastrointestinal: Negative for nausea and  vomiting.  Musculoskeletal: Negative for myalgias.       Negative for muscle weakness  Endo/Heme/Allergies:       Positive for polyphagia Negative for hypoglycemia    PHYSICAL EXAM: Blood pressure 109/73, pulse 82, temperature 97.6 F (36.4 C), temperature source Oral, height _0  (1.676 m), weight 235 lb (106.6 kg), SpO2 96 %. Body mass index is 37.93 kg/m. Physical Exam  Constitutional: She is oriented to person, place, and time. She appears well-developed and well-nourished.  Cardiovascular: Normal rate.  Pulmonary/Chest: Effort normal.  Musculoskeletal: Normal range of motion.  Neurological: She is oriented to person, place, and time.  Skin: Skin is warm and dry.  Psychiatric: She has a normal mood and affect. Her behavior is normal.  Vitals reviewed.   RECENT LABS AND TESTS: BMET    Component Value Date/Time   NA 142 06/03/2018 1022   K 4.5 06/03/2018 1022   CL 104 06/03/2018 1022   CO2 23 06/03/2018 1022   GLUCOSE 110 (H) 06/03/2018 1022   GLUCOSE 92 01/15/2017 1417   BUN 20 06/03/2018 1022   CREATININE 1.06 (H) 06/03/2018 1022   CREATININE 0.86 09/28/2014 1557   CALCIUM 9.3 06/03/2018 1022   GFRNONAA 51 (L) 06/03/2018 1022   GFRNONAA 56 (L) 06/06/2014 1653   GFRAA 59 (L) 06/03/2018 1022   GFRAA 65 06/06/2014 1653   Lab Results  Component Value Date   HGBA1C 5.8 (H) 06/03/2018   HGBA1C 5.8 (H) 12/24/2017   HGBA1C 6.2  (H) 09/17/2017   HGBA1C 5.9 (H) 03/09/2014   Lab Results  Component Value Date   INSULIN 17.6 06/03/2018   INSULIN 19.4 12/24/2017   INSULIN 47.7 (H) 09/17/2017   CBC    Component Value Date/Time   WBC 8.5 09/17/2017 1131   WBC 9.1 02/24/2017 1555   RBC 5.22 09/17/2017 1131   RBC 5.09 02/24/2017 1555   HGB 14.9 09/17/2017 1131   HCT 45.1 09/17/2017 1131   PLT 258 08/27/2017 1509   MCV 86 09/17/2017 1131   MCH 28.5 09/17/2017 1131   MCH 29.0 09/28/2014 1557   MCHC 33.0 09/17/2017 1131   MCHC 33.9 02/24/2017 1555   RDW 13.5 09/17/2017 1131   LYMPHSABS 2.0 09/17/2017 1131   MONOABS 1.3 (H) 02/24/2017 1555   EOSABS 0.2 09/17/2017 1131   BASOSABS 0.0 09/17/2017 1131   Iron/TIBC/Ferritin/ %Sat No results found for: IRON, TIBC, FERRITIN, IRONPCTSAT Lipid Panel     Component Value Date/Time   CHOL 176 06/03/2018 1022   TRIG 113 06/03/2018 1022   HDL 53 06/03/2018 1022   CHOLHDL 3 01/15/2017 1417   VLDL 33.6 01/15/2017 1417   LDLCALC 100 (H) 06/03/2018 1022   LDLDIRECT 81.0 07/15/2016 1419   Hepatic Function Panel     Component Value Date/Time   PROT 6.8 06/03/2018 1022   ALBUMIN 3.9 06/03/2018 1022   AST 24 06/03/2018 1022   ALT 16 06/03/2018 1022   ALKPHOS 84 06/03/2018 1022   BILITOT 0.3 06/03/2018 1022   BILIDIR 0.1 01/15/2017 1417      Component Value Date/Time   TSH 2.040 09/17/2017 1131   TSH 2.70 07/15/2016 1419   TSH 1.529 06/06/2014 1608   Results for BERIT, RACZKOWSKI (MRN [id number]) as of 06/08/2018 09:36  Ref. Range 06/03/2018 10:22  Vitamin D, 25-Hydroxy Latest Ref Range: 30.0 - 100.0 ng/mL 53.1   ASSESSMENT AND PLAN: Prediabetes - Plan: Hemoglobin A1c, Insulin, random  Other hyperlipidemia - Plan: Comprehensive metabolic panel, Lipid Panel With LDL/HDL  Ratio  Vitamin D deficiency - Plan: VITAMIN D 25 Hydroxy (Vit-D Deficiency, Fractures)  Class 2 severe obesity with serious comorbidity and body mass index (BMI) of 38.0 to 38.9 in adult,  unspecified obesity type ([place])  PLAN:  Vitamin D Deficiency Talma was informed that low vitamin D levels contributes to fatigue and are associated with obesity, breast, and colon cancer. She agrees to continue to take OTC Vit D _0 ,000 IU daily. We will check labs and she will follow up for routine testing of vitamin D, at least 2-3 times per year. She was informed of the risk of over-replacement of vitamin D and agrees to not increase her dose unless she discusses this with [place] first.  Pre-Diabetes Abbagayle will continue to work on weight loss, exercise, and decreasing simple carbohydrates in her diet to help decrease the risk of diabetes. She was informed that eating too many simple carbohydrates or too many calories at one sitting increases the likelihood of GI side effects. We will check labs and follow. Devri agreed to follow up with [place] as directed to monitor her progress.  Hyperlipidemia Estoria was informed of the American Heart Association Guidelines emphasizing intensive lifestyle modifications as the first line treatment for hyperlipidemia. We discussed many lifestyle modifications today in depth, and Deniss will continue to work on decreasing saturated fats such as fatty red meat, butter and many fried foods. She will also increase vegetables and lean protein in her diet and continue to work on exercise and weight loss efforts. We will check labs and Angellynn will follow up at the agreed upon time.  Obesity Camden is currently in the action stage of change. As such, her goal is to continue with weight loss efforts She has agreed to portion control better and make smarter food choices, such as increase vegetables and decrease simple carbohydrates  Zakirah has been instructed to work up to a goal of 150 minutes of combined cardio and strengthening exercise per week for weight loss and overall health benefits. We discussed the following Behavioral Modification Strategies today: better snacking  choices, increasing lean protein intake and work on meal planning and easy cooking plans  Linden has agreed to follow up with our clinic in 2 to 3 weeks. She was informed of the importance of frequent follow up visits to maximize her success with intensive lifestyle modifications for her multiple health conditions.   OBESITY BEHAVIORAL INTERVENTION VISIT  Today's visit was # 15 out of 22.  Starting weight: 267 lbs Starting date: 09/17/17 Today's weight : 235 lbs  Today's date: 06/03/2018 Total lbs lost to date: 62 (Patients must lose 7 lbs in the first 6 months to continue with counseling)   ASK: We discussed the diagnosis of obesity with [name] today and Amellia agreed to give [place] permission to discuss obesity behavioral modification therapy today.  ASSESS: Aliece has the diagnosis of obesity and her BMI today is 37.95 Elaf is in the action stage of change   ADVISE: Olivette was educated on the multiple health risks of obesity as well as the benefit of weight loss to improve her health. She was advised of the need for long term treatment and the importance of lifestyle modifications.  AGREE: Multiple dietary modification options and treatment options were discussed and  Edgar agreed to the above obesity treatment plan.  I, Doreene Nest, am acting as transcriptionist for Dennard Nip, MD  I have reviewed the above documentation for accuracy and completeness, and I agree with  the above. -Dennard Nip, MD

## 2018-06-11 DIAGNOSIS — M1611 Unilateral primary osteoarthritis, right hip: Secondary | ICD-10-CM | POA: Diagnosis not present

## 2018-06-12 ENCOUNTER — Other Ambulatory Visit: Payer: Self-pay | Admitting: Family Medicine

## 2018-06-18 ENCOUNTER — Telehealth: Payer: Self-pay | Admitting: Cardiology

## 2018-06-18 NOTE — Telephone Encounter (Signed)
Remote transmission reviewed. Presenting rhythm: ApVs. (1) AF episode on 05/20/18 x 8days. No VHR episodes. Stable lead measurements. Normal device function.  Called patient with results of ppm transmission. Informed patient that her device function is stable and she is in SR. I instructed patient to contact her PCP about recent sx's. Patient verbalized understanding.

## 2018-06-18 NOTE — Telephone Encounter (Signed)
Patient called and stated that she does not feel well since Tuesday (06-16-2018). I instructed pt to send a manual transmission w/ her home monitor.

## 2018-06-24 ENCOUNTER — Ambulatory Visit (INDEPENDENT_AMBULATORY_CARE_PROVIDER_SITE_OTHER): Payer: Medicare Other | Admitting: Family Medicine

## 2018-06-24 VITALS — BP 101/66 | HR 78 | Temp 97.4°F | Ht 66.0 in | Wt 237.0 lb

## 2018-06-24 DIAGNOSIS — Z6838 Body mass index (BMI) 38.0-38.9, adult: Secondary | ICD-10-CM | POA: Diagnosis not present

## 2018-06-24 DIAGNOSIS — E559 Vitamin D deficiency, unspecified: Secondary | ICD-10-CM | POA: Diagnosis not present

## 2018-06-24 DIAGNOSIS — R7303 Prediabetes: Secondary | ICD-10-CM | POA: Diagnosis not present

## 2018-06-24 NOTE — Progress Notes (Signed)
Office: [phone]  /  Fax: [phone]   HPI:   Chief Complaint: OBESITY [name] [name] is here to discuss her progress with her obesity treatment plan. She is on the portion control better and make smarter food choices, such as increase vegetables and decrease simple carbohydrates and is following her eating plan approximately 35 % of the time. She states she is at the gym doing cardio and swimming for 30 minutes 2 times per week. [name] [name] reports struggling with portion control over the last few weeks. She states that she is snacking more, especially at night.  Her weight is 237 lb (107.5 kg) today and has gained 2 pounds since her last visit. She has lost 30 lbs since starting treatment with [place].  Vitamin D Deficiency [name] [name] has a diagnosis of vitamin D deficiency. She is not on Vit D, last level at goal. She denies nausea, vomiting or muscle weakness.  Pre-Diabetes [name] [name] has a diagnosis of pre-diabetes based on her elevated Hgb A1c and was informed this puts her at greater risk of developing diabetes. She is not on medication and she reports polyphagia. Last A1c not yet at goal. She continues to work on diet and exercise to decrease risk of diabetes. She denies nausea or hypoglycemia.  ALLERGIES: Allergies  Allergen Reactions  . Ancef [Cefazolin] Hives and Itching  . Pseudoephedrine Hcl     Other reaction(s): palpitations (moderate)  . Caffeine Palpitations    MEDICATIONS: Current Outpatient Medications on File Prior to Visit  Medication Sig Dispense Refill  . apixaban (ELIQUIS) 5 MG TABS tablet Take 1 tablet (5 mg total) by mouth 2 (two) times daily. 180 tablet 1  . Biotin 5000 MCG CAPS Take 5,000 mcg by mouth daily.     . Calcium Carbonate-Vit D-Min (CALCIUM 1200 PO) Take 2,400 mg by mouth daily.     . cholecalciferol (VITAMIN D) 1000 UNITS tablet Take 1,000 Units by mouth daily.     Marland Kitchen Co-Enzyme Q10 100 MG CAPS Take 100 mg by mouth daily.     . diazepam (VALIUM) 5 MG tablet TAKE  1 TABLET BY MOUTH ONCE DAILY AS NEEDED FOR ANXIETY 30 tablet 1  . diclofenac sodium (VOLTAREN) 1 % GEL Apply 2 g topically 4 (four) times daily as needed. 200 g 3  . fluticasone (FLONASE) 50 MCG/ACT nasal spray Place 2 sprays into both nostrils daily. 16 g 6  . folic acid (FOLVITE) 614 MCG tablet Take 400 mcg by mouth daily.    Marland Kitchen glucosamine-chondroitin 500-400 MG tablet Take 1 tablet by mouth daily.     Marland Kitchen loratadine (KLS ALLERCLEAR) 10 MG tablet Take 10 mg by mouth daily.    Marland Kitchen MAGNESIUM CITRATE PO Take 250 mg by mouth daily. 1 tab daily    . metoprolol tartrate (LOPRESSOR) 25 MG tablet Take 0.5 tablets (12.5 mg total) by mouth 2 (two) times daily. 90 tablet 3  . nitroGLYCERIN (NITROSTAT) 0.4 MG SL tablet Place 0.4 mg under the tongue every 5 (five) minutes as needed for chest pain.    . pantoprazole (PROTONIX) 40 MG tablet TAKE 1 TABLET BY MOUTH DAILY 90 tablet 1  . predniSONE (DELTASONE) 10 MG tablet Take 81m x 3 days,361mx 3 days, 2063m 3 days, 23m64m3 days 30 tablet 0  . Resveratrol 250 MG CAPS Take 250 mg by mouth daily.    . sertraline (ZOLOFT) 100 MG tablet TAKE 1 TABLET BY MOUTH ONCE DAILY 90 tablet 0  . Simethicone (GAS-X PO) Take  2 tablets by mouth daily as needed (bloating).     No current facility-administered medications on file prior to visit.     PAST MEDICAL HISTORY: Past Medical History:  Diagnosis Date  . 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 Arthritis    "all over"  . Atrial fibrillation ([place])   . Back pain   . Complication of anesthesia    "I woke up during 2 different procedures" (10/04/2014)  . Frequent UTI   . GERD (gastroesophageal reflux disease)   . Hypertension   . MVP (mitral valve prolapse)   . Obesity   . Osteoarthritis   . Presence of permanent cardiac pacemaker   . PVC's (premature ventricular contractions)   . Shortness of breath   . 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 Sleep apnea     . Stroke [facility]) 01/2014   denies deficits on 10/04/2014  . Tachy-brady syndrome ([place]) 10/2014  . TIA (transient ischemic attack)     PAST SURGICAL HISTORY: Past Surgical History:  Procedure Laterality Date  . CARDIOVERSION N/A 09/02/2017   Procedure: CARDIOVERSION;  Surgeon: Sanda Klein, MD;  [facility];  Service: Cardiovascular;  Laterality: N/A;  . EXCISION VAGINAL CYST     benign nodules  . INSERT / REPLACE / REMOVE PACEMAKER  10/04/2014   Medtronic Advisa model [id number] serial number [id number]  . LOOP RECORDER EXPLANT N/A 10/04/2014   Procedure: LOOP RECORDER EXPLANT;  Surgeon: Sanda Klein, MD;  Location: [place] CATH LAB;  Service: Cardiovascular;  Laterality: N/A;  . LOOP RECORDER IMPLANT N/A 04/19/2014   Procedure: LOOP RECORDER IMPLANT;  Surgeon: Sanda Klein, MD;  Location: [place] CATH LAB;  Service: Cardiovascular;  Laterality: N/A;  . NM MYOCAR PERF WALL MOTION  12/18/2007   normal  . PERMANENT PACEMAKER INSERTION N/A 10/04/2014   Procedure: PERMANENT PACEMAKER INSERTION;  Surgeon: Sanda Klein, MD;  Location: [place] CATH LAB;  Service: Cardiovascular;  Laterality: N/A;  . TUBAL LIGATION    . US ECHOCARDIOGRAPHY  05/15/2010   LA mildly dilated,mild mitral annular ca+, AOV mildly sclerotic, mild asymmetric LVH    SOCIAL HISTORY: Social History   Tobacco Use  . Smoking status: Former Smoker    Packs/day: 2.00    Years: 18.00    Pack years: 36.00    Types: Cigarettes  . Smokeless tobacco: Never Used  . Tobacco comment: "quit smoking in 1971"  Substance Use Topics  . Alcohol use: Yes    Alcohol/week: 0.0 oz    Comment: 10/04/2014 "might have a drink a couple times/yr"  . Drug use: No    FAMILY HISTORY: Family History  Problem Relation Age of Onset  . Heart attack Mother   . Sudden death Mother   . Depression Mother   . Stroke Father   . Heart failure Father   . Depression Father   . Heart failure Brother   . Stroke Brother   . Alpha-1 antitrypsin  deficiency Daughter     ROS: Review of Systems  Constitutional: Negative for weight loss.  Gastrointestinal: Negative for nausea and vomiting.  Musculoskeletal:       Negative muscle weakness  Endo/Heme/Allergies:       Positive polyphagia Negative hypoglycemia    PHYSICAL EXAM: Blood pressure 101/66, pulse 78, temperature (!) 97.4 F (36.3 C), temperature source Oral, height _0  (1.676 m), weight 237 lb (107.5 kg), SpO2 95 %. Body mass index is 38.25 kg/m. Physical Exam  Constitutional:  She is oriented to person, place, and time. She appears well-developed and well-nourished.  Cardiovascular: Normal rate.  Pulmonary/Chest: Effort normal.  Musculoskeletal: Normal range of motion.  Neurological: She is oriented to person, place, and time.  Skin: Skin is warm and dry.  Psychiatric: She has a normal mood and affect. Her behavior is normal.  Vitals reviewed.   RECENT LABS AND TESTS: BMET    Component Value Date/Time   NA 142 06/03/2018 1022   K 4.5 06/03/2018 1022   CL 104 06/03/2018 1022   CO2 23 06/03/2018 1022   GLUCOSE 110 (H) 06/03/2018 1022   GLUCOSE 92 01/15/2017 1417   BUN 20 06/03/2018 1022   CREATININE 1.06 (H) 06/03/2018 1022   CREATININE 0.86 09/28/2014 1557   CALCIUM 9.3 06/03/2018 1022   GFRNONAA 51 (L) 06/03/2018 1022   GFRNONAA 56 (L) 06/06/2014 1653   GFRAA 59 (L) 06/03/2018 1022   GFRAA 65 06/06/2014 1653   Lab Results  Component Value Date   HGBA1C 5.8 (H) 06/03/2018   HGBA1C 5.8 (H) 12/24/2017   HGBA1C 6.2 (H) 09/17/2017   HGBA1C 5.9 (H) 03/09/2014   Lab Results  Component Value Date   INSULIN 17.6 06/03/2018   INSULIN 19.4 12/24/2017   INSULIN 47.7 (H) 09/17/2017   CBC    Component Value Date/Time   WBC 8.5 09/17/2017 1131   WBC 9.1 02/24/2017 1555   RBC 5.22 09/17/2017 1131   RBC 5.09 02/24/2017 1555   HGB 14.9 09/17/2017 1131   HCT 45.1 09/17/2017 1131   PLT 258 08/27/2017 1509   MCV 86 09/17/2017 1131   MCH 28.5 09/17/2017  1131   MCH 29.0 09/28/2014 1557   MCHC 33.0 09/17/2017 1131   MCHC 33.9 02/24/2017 1555   RDW 13.5 09/17/2017 1131   LYMPHSABS 2.0 09/17/2017 1131   MONOABS 1.3 (H) 02/24/2017 1555   EOSABS 0.2 09/17/2017 1131   BASOSABS 0.0 09/17/2017 1131   Iron/TIBC/Ferritin/ %Sat No results found for: IRON, TIBC, FERRITIN, IRONPCTSAT Lipid Panel     Component Value Date/Time   CHOL 176 06/03/2018 1022   TRIG 113 06/03/2018 1022   HDL 53 06/03/2018 1022   CHOLHDL 3 01/15/2017 1417   VLDL 33.6 01/15/2017 1417   LDLCALC 100 (H) 06/03/2018 1022   LDLDIRECT 81.0 07/15/2016 1419   Hepatic Function Panel     Component Value Date/Time   PROT 6.8 06/03/2018 1022   ALBUMIN 3.9 06/03/2018 1022   AST 24 06/03/2018 1022   ALT 16 06/03/2018 1022   ALKPHOS 84 06/03/2018 1022   BILITOT 0.3 06/03/2018 1022   BILIDIR 0.1 01/15/2017 1417      Component Value Date/Time   TSH 2.040 09/17/2017 1131   TSH 2.70 07/15/2016 1419   TSH 1.529 06/06/2014 1608  Results for LEESHA, VENO (MRN [id number]) as of 06/24/2018 15:08  Ref. Range 06/03/2018 10:22  Vitamin D, 25-Hydroxy Latest Ref Range: 30.0 - 100.0 ng/mL 53.1    ASSESSMENT AND PLAN: Vitamin D deficiency  Prediabetes  Class 2 severe obesity with serious comorbidity and body mass index (BMI) of 38.0 to 38.9 in adult, unspecified obesity type ([place])  PLAN:  Vitamin D Deficiency [name] [name] was informed that low vitamin D levels contributes to fatigue and are associated with obesity, breast, and colon cancer. She will follow up for routine testing of vitamin D, at least 2-3 times per year. She was informed of the risk of over-replacement of vitamin D and agrees to not increase her dose unless she  discusses this with [place] first. We will recheck labs in 3 months and [name] [name] agrees to follow up with our clinic in 2 to 3 weeks.  Pre-Diabetes [name] [name] will continue to work on weight loss, diet, exercise, and decreasing simple carbohydrates in her diet to help  decrease the risk of diabetes. We dicussed metformin including benefits and risks. She was informed that eating too many simple carbohydrates or too many calories at one sitting increases the likelihood of GI side effects. [name] [name] declined metformin for now and a prescription was not written today. [name] [name] agrees to follow up with our clinic in 2 to 3 weeks as directed to monitor her progress.  Obesity [name] [name] is currently in the action stage of change. As such, her goal is to continue with weight loss efforts She has agreed to portion control better and make smarter food choices, such as increase vegetables and decrease simple carbohydrates  [name] [name] has been instructed to work up to a goal of 150 minutes of combined cardio and strengthening exercise per week for weight loss and overall health benefits. We discussed the following Behavioral Modification Strategies today: increasing lean protein intake, decreasing simple carbohydrates  and work on meal planning and easy cooking plans   [name] [name] has agreed to follow up with our clinic in 2 to 3 weeks. She was informed of the importance of frequent follow up visits to maximize her success with intensive lifestyle modifications for her multiple health conditions.  We spent > than 50% of the 25 minute visit on the counseling as documented in the note.   OBESITY BEHAVIORAL INTERVENTION VISIT  Today's visit was # 16 out of 22.  Starting weight: 267 lbs Starting date: 09/17/17 Today's weight : 237 lbs  Today's date: 06/24/2018 Total lbs lost to date: 30    ASK: We discussed the diagnosis of obesity with [name] [name] today and [name] [name] agreed to give [place] permission to discuss obesity behavioral modification therapy today.  ASSESS: [name] [name] has the diagnosis of obesity and her BMI today is 38.27 [name] [name] is in the action stage of change   ADVISE: [name] [name] was educated on the multiple health risks of obesity as well as the benefit of weight loss to improve  her health. She was advised of the need for long term treatment and the importance of lifestyle modifications.  AGREE: Multiple dietary modification options and treatment options were discussed and  [name] [name] agreed to the above obesity treatment plan.  [name] [name], am acting as transcriptionist for Dennard Nip, MD

## 2018-07-06 ENCOUNTER — Ambulatory Visit (INDEPENDENT_AMBULATORY_CARE_PROVIDER_SITE_OTHER): Payer: Medicare Other | Admitting: *Deleted

## 2018-07-06 DIAGNOSIS — I495 Sick sinus syndrome: Secondary | ICD-10-CM | POA: Diagnosis not present

## 2018-07-06 NOTE — Progress Notes (Signed)
Remote pacemaker transmission.   

## 2018-07-08 ENCOUNTER — Ambulatory Visit (INDEPENDENT_AMBULATORY_CARE_PROVIDER_SITE_OTHER): Payer: Self-pay | Admitting: Family Medicine

## 2018-07-08 ENCOUNTER — Encounter (INDEPENDENT_AMBULATORY_CARE_PROVIDER_SITE_OTHER): Payer: Self-pay

## 2018-07-14 LAB — CUP PACEART REMOTE DEVICE CHECK
Battery Remaining Longevity: 84 mo
Battery Voltage: 3.01 V
Brady Statistic AS VP Percent: 0.41 %
Brady Statistic RA Percent Paced: 92.82 %
Date Time Interrogation Session: 20190805120705
Implantable Lead Implant Date: 20151103
Implantable Lead Location: 753860
Implantable Lead Model: 5076
Implantable Pulse Generator Implant Date: 20151103
Lead Channel Impedance Value: 418 Ohm
Lead Channel Pacing Threshold Amplitude: 0.5 V
Lead Channel Pacing Threshold Pulse Width: 0.4 ms
Lead Channel Pacing Threshold Pulse Width: 0.4 ms
Lead Channel Sensing Intrinsic Amplitude: 5.5 mV
Lead Channel Sensing Intrinsic Amplitude: 5.5 mV
Lead Channel Sensing Intrinsic Amplitude: 7.875 mV
Lead Channel Setting Pacing Amplitude: 1.5 V
MDC IDC LEAD IMPLANT DT: 20151103
MDC IDC LEAD LOCATION: 753859
MDC IDC MSMT LEADCHNL RA IMPEDANCE VALUE: 456 Ohm
MDC IDC MSMT LEADCHNL RV IMPEDANCE VALUE: 551 Ohm
MDC IDC MSMT LEADCHNL RV IMPEDANCE VALUE: 589 Ohm
MDC IDC MSMT LEADCHNL RV PACING THRESHOLD AMPLITUDE: 0.5 V
MDC IDC MSMT LEADCHNL RV SENSING INTR AMPL: 7.875 mV
MDC IDC SET LEADCHNL RV PACING AMPLITUDE: 2 V
MDC IDC SET LEADCHNL RV PACING PULSEWIDTH: 0.4 ms
MDC IDC SET LEADCHNL RV SENSING SENSITIVITY: 2.8 mV
MDC IDC STAT BRADY AP VP PERCENT: 0.04 %
MDC IDC STAT BRADY AP VS PERCENT: 93.8 %
MDC IDC STAT BRADY AS VS PERCENT: 5.75 %
MDC IDC STAT BRADY RV PERCENT PACED: 0.47 %

## 2018-07-21 ENCOUNTER — Ambulatory Visit (INDEPENDENT_AMBULATORY_CARE_PROVIDER_SITE_OTHER): Payer: Medicare Other | Admitting: Family Medicine

## 2018-07-21 VITALS — BP 104/68 | HR 66 | Temp 97.3°F | Ht 66.0 in | Wt 238.0 lb

## 2018-07-21 DIAGNOSIS — Z6837 Body mass index (BMI) 37.0-37.9, adult: Secondary | ICD-10-CM | POA: Diagnosis not present

## 2018-07-21 DIAGNOSIS — I10 Essential (primary) hypertension: Secondary | ICD-10-CM

## 2018-07-21 NOTE — Progress Notes (Signed)
Office: [phone]  /  Fax: [phone]   HPI:   Chief Complaint: OBESITY [name] [name] is here to discuss her progress with her obesity treatment plan. She is on the portion control better and make smarter food choices, such as increase vegetables and decrease simple carbohydrates and is following her eating plan approximately 25 % of the time. She states she is exercising 0 minutes 0 times per week. [name] [name] continues to gain weight slowly on the PC/[name] [name] plan which was given to her when she struggled to follow any structured diet.  Her weight is 238 lb (108 kg) today and has gained 1 pound since her last visit. She has lost 29 lbs since starting treatment with [place].  Hypertension [name] [name] is a 77 y.o. female with hypertension. [name] [name] blood pressure is well controlled on medications. She denies lightheadedness and is working on diet and ready to start exercising.   ALLERGIES: Allergies  Allergen Reactions  . Ancef [Cefazolin] Hives and Itching  . Pseudoephedrine Hcl     Other reaction(s): palpitations (moderate)  . Caffeine Palpitations    MEDICATIONS: Current Outpatient Medications on File Prior to Visit  Medication Sig Dispense Refill  . apixaban (ELIQUIS) 5 MG TABS tablet Take 1 tablet (5 mg total) by mouth 2 (two) times daily. 180 tablet 1  . Biotin 5000 MCG CAPS Take 5,000 mcg by mouth daily.     . Calcium Carbonate-Vit D-Min (CALCIUM 1200 PO) Take 2,400 mg by mouth daily.     . cholecalciferol (VITAMIN D) 1000 UNITS tablet Take 1,000 Units by mouth daily.     Marland Kitchen Co-Enzyme Q10 100 MG CAPS Take 100 mg by mouth daily.     . diazepam (VALIUM) 5 MG tablet TAKE 1 TABLET BY MOUTH ONCE DAILY AS NEEDED FOR ANXIETY 30 tablet 1  . diclofenac sodium (VOLTAREN) 1 % GEL Apply 2 g topically 4 (four) times daily as needed. 200 g 3  . fluticasone (FLONASE) 50 MCG/ACT nasal spray Place 2 sprays into both nostrils daily. 16 g 6  . folic acid (FOLVITE) 286 MCG tablet Take 400 mcg by mouth daily.     Marland Kitchen glucosamine-chondroitin 500-400 MG tablet Take 1 tablet by mouth daily.     Marland Kitchen loratadine (KLS ALLERCLEAR) 10 MG tablet Take 10 mg by mouth daily.    Marland Kitchen MAGNESIUM CITRATE PO Take 250 mg by mouth daily. 1 tab daily    . metoprolol tartrate (LOPRESSOR) 25 MG tablet Take 0.5 tablets (12.5 mg total) by mouth 2 (two) times daily. 90 tablet 3  . nitroGLYCERIN (NITROSTAT) 0.4 MG SL tablet Place 0.4 mg under the tongue every 5 (five) minutes as needed for chest pain.    . pantoprazole (PROTONIX) 40 MG tablet TAKE 1 TABLET BY MOUTH DAILY 90 tablet 1  . Resveratrol 250 MG CAPS Take 250 mg by mouth daily.    . sertraline (ZOLOFT) 100 MG tablet TAKE 1 TABLET BY MOUTH ONCE DAILY 90 tablet 0  . Simethicone (GAS-X PO) Take 2 tablets by mouth daily as needed (bloating).     No current facility-administered medications on file prior to visit.     PAST MEDICAL HISTORY: Past Medical History:  Diagnosis Date  . 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 Arthritis    "all over"  . Atrial fibrillation ([place])   . Back pain   . Complication of anesthesia    "I woke up during 2 different procedures" (10/04/2014)  . Frequent UTI   . GERD (gastroesophageal  reflux disease)   . Hypertension   . MVP (mitral valve prolapse)   . Obesity   . Osteoarthritis   . Presence of permanent cardiac pacemaker   . PVC's (premature ventricular contractions)   . Shortness of breath   . 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 Sleep apnea   . Stroke [facility]) 01/2014   denies deficits on 10/04/2014  . Tachy-brady syndrome ([place]) 10/2014  . TIA (transient ischemic attack)     PAST SURGICAL HISTORY: Past Surgical History:  Procedure Laterality Date  . CARDIOVERSION N/A 09/02/2017   Procedure: CARDIOVERSION;  Surgeon: Sanda Klein, MD;  [facility];  Service: Cardiovascular;  Laterality: N/A;  . EXCISION VAGINAL CYST     benign nodules  . INSERT / REPLACE /  REMOVE PACEMAKER  10/04/2014   Medtronic Advisa model [id number] serial number [id number]  . LOOP RECORDER EXPLANT N/A 10/04/2014   Procedure: LOOP RECORDER EXPLANT;  Surgeon: Sanda Klein, MD;  Location: [place] CATH LAB;  Service: Cardiovascular;  Laterality: N/A;  . LOOP RECORDER IMPLANT N/A 04/19/2014   Procedure: LOOP RECORDER IMPLANT;  Surgeon: Sanda Klein, MD;  Location: [place] CATH LAB;  Service: Cardiovascular;  Laterality: N/A;  . NM MYOCAR PERF WALL MOTION  12/18/2007   normal  . PERMANENT PACEMAKER INSERTION N/A 10/04/2014   Procedure: PERMANENT PACEMAKER INSERTION;  Surgeon: Sanda Klein, MD;  Location: [place] CATH LAB;  Service: Cardiovascular;  Laterality: N/A;  . TUBAL LIGATION    . US ECHOCARDIOGRAPHY  05/15/2010   LA mildly dilated,mild mitral annular ca+, AOV mildly sclerotic, mild asymmetric LVH    SOCIAL HISTORY: Social History   Tobacco Use  . Smoking status: Former Smoker    Packs/day: 2.00    Years: 18.00    Pack years: 36.00    Types: Cigarettes  . Smokeless tobacco: Never Used  . Tobacco comment: "quit smoking in 1971"  Substance Use Topics  . Alcohol use: Yes    Alcohol/week: 0.0 standard drinks    Comment: 10/04/2014 "might have a drink a couple times/yr"  . Drug use: No    FAMILY HISTORY: Family History  Problem Relation Age of Onset  . Heart attack Mother   . Sudden death Mother   . Depression Mother   . Stroke Father   . Heart failure Father   . Depression Father   . Heart failure Brother   . Stroke Brother   . Alpha-1 antitrypsin deficiency Daughter     ROS: Review of Systems  Constitutional: Negative for weight loss.  Neurological:       Negative lightheadedness    PHYSICAL EXAM: Blood pressure 104/68, pulse 66, temperature (!) 97.3 F (36.3 C), temperature source Oral, height 5' 6" (1.676 m), weight 238 lb (108 kg), SpO2 96 %. Body mass index is 38.41 kg/m. Physical Exam  Constitutional: She is oriented to person, place, and time. She  appears well-developed and well-nourished.  Cardiovascular: Normal rate.  Pulmonary/Chest: Effort normal.  Musculoskeletal: Normal range of motion.  Neurological: She is oriented to person, place, and time.  Skin: Skin is warm and dry.  Psychiatric: She has a normal mood and affect. Her behavior is normal.  Vitals reviewed.   RECENT LABS AND TESTS: BMET    Component Value Date/Time   NA 142 06/03/2018 1022   K 4.5 06/03/2018 1022   CL 104 06/03/2018 1022   CO2 23 06/03/2018 1022   GLUCOSE 110 (H) 06/03/2018 1022  GLUCOSE 92 01/15/2017 1417   BUN 20 06/03/2018 1022   CREATININE 1.06 (H) 06/03/2018 1022   CREATININE 0.86 09/28/2014 1557   CALCIUM 9.3 06/03/2018 1022   GFRNONAA 51 (L) 06/03/2018 1022   GFRNONAA 56 (L) 06/06/2014 1653   GFRAA 59 (L) 06/03/2018 1022   GFRAA 65 06/06/2014 1653   Lab Results  Component Value Date   HGBA1C 5.8 (H) 06/03/2018   HGBA1C 5.8 (H) 12/24/2017   HGBA1C 6.2 (H) 09/17/2017   HGBA1C 5.9 (H) 03/09/2014   Lab Results  Component Value Date   INSULIN 17.6 06/03/2018   INSULIN 19.4 12/24/2017   INSULIN 47.7 (H) 09/17/2017   CBC    Component Value Date/Time   WBC 8.5 09/17/2017 1131   WBC 9.1 02/24/2017 1555   RBC 5.22 09/17/2017 1131   RBC 5.09 02/24/2017 1555   HGB 14.9 09/17/2017 1131   HCT 45.1 09/17/2017 1131   PLT 258 08/27/2017 1509   MCV 86 09/17/2017 1131   MCH 28.5 09/17/2017 1131   MCH 29.0 09/28/2014 1557   MCHC 33.0 09/17/2017 1131   MCHC 33.9 02/24/2017 1555   RDW 13.5 09/17/2017 1131   LYMPHSABS 2.0 09/17/2017 1131   MONOABS 1.3 (H) 02/24/2017 1555   EOSABS 0.2 09/17/2017 1131   BASOSABS 0.0 09/17/2017 1131   Iron/TIBC/Ferritin/ %Sat No results found for: IRON, TIBC, FERRITIN, IRONPCTSAT Lipid Panel     Component Value Date/Time   CHOL 176 06/03/2018 1022   TRIG 113 06/03/2018 1022   HDL 53 06/03/2018 1022   CHOLHDL 3 01/15/2017 1417   VLDL 33.6 01/15/2017 1417   LDLCALC 100 (H) 06/03/2018 1022    LDLDIRECT 81.0 07/15/2016 1419   Hepatic Function Panel     Component Value Date/Time   PROT 6.8 06/03/2018 1022   ALBUMIN 3.9 06/03/2018 1022   AST 24 06/03/2018 1022   ALT 16 06/03/2018 1022   ALKPHOS 84 06/03/2018 1022   BILITOT 0.3 06/03/2018 1022   BILIDIR 0.1 01/15/2017 1417      Component Value Date/Time   TSH 2.040 09/17/2017 1131   TSH 2.70 07/15/2016 1419   TSH 1.529 06/06/2014 1608    ASSESSMENT AND PLAN: Essential hypertension  Class 2 severe obesity with serious comorbidity and body mass index (BMI) of 37.0 to 37.9 in adult, unspecified obesity type ([facility])  PLAN:  Hypertension We discussed sodium restriction, working on healthy weight loss, and a regular exercise program as the means to achieve improved blood pressure control. Doriana agreed with this plan and agreed to follow up as directed. We will continue to monitor her blood pressure as well as her progress with the above lifestyle modifications. She will continue her medications, and continue with diet and increase exercise, and will continue to monitor. She will watch for signs of hypotension as she continues her lifestyle modifications. [name] [name] agrees to follow up with our clinic in 2 weeks.  We spent > than 50% of the 15 minute visit on the counseling as documented in the note.  Obesity [name] [name] is currently in the action stage of change. As such, her goal is to continue with weight loss efforts She has agreed to follow the Category 2 plan [name] [name] has been instructed to work up to a goal of 150 minutes of combined cardio and strengthening exercise per week or start [employer] walking off the pounds tapes for 15 minutes daily for weight loss and overall health benefits. We discussed the following Behavioral Modification Strategies today: increasing lean protein  intake, decreasing simple carbohydrates  and work on meal planning and easy cooking plans   [name] [name] has agreed to follow up with our clinic in 2  weeks. She was informed of the importance of frequent follow up visits to maximize her success with intensive lifestyle modifications for her multiple health conditions.   OBESITY BEHAVIORAL INTERVENTION VISIT  Today's visit was # 17 out of 22.  Starting weight: 267 lbs Starting date: 09/17/17 Today's weight : 238 lbs  Today's date: 07/21/2018 Total lbs lost to date: 27    ASK: We discussed the diagnosis of obesity with [name] [name] today and [name] [name] agreed to give [place] permission to discuss obesity behavioral modification therapy today.  ASSESS: [name] [name] has the diagnosis of obesity and her BMI today is 38.43 [name] [name] is in the action stage of change   ADVISE: [name] [name] was educated on the multiple health risks of obesity as well as the benefit of weight loss to improve her health. She was advised of the need for long term treatment and the importance of lifestyle modifications.  AGREE: Multiple dietary modification options and treatment options were discussed and  [name] [name] agreed to the above obesity treatment plan.  I, Trixie Dredge, am acting as transcriptionist for Dennard Nip, MD  I have reviewed the above documentation for accuracy and completeness, and I agree with the above. -Dennard Nip, MD

## 2018-07-22 ENCOUNTER — Telehealth: Payer: Self-pay | Admitting: Emergency Medicine

## 2018-07-22 NOTE — Telephone Encounter (Signed)
Okay 

## 2018-07-22 NOTE — Telephone Encounter (Signed)
Copied from [place] [id number]. Topic: Appointment Scheduling - Scheduling Inquiry for Clinic >> Jul 22, 2018  1:37 PM Burchel, Abbi R wrote: Pt would like to Transfer from Dr Birdie Riddle to Dr Juleen China.  Please advise.  Pt: [phone]    [place] to [place] Care?

## 2018-07-22 NOTE — Telephone Encounter (Signed)
Ok to transfer.  Please give her my best

## 2018-07-22 NOTE — Telephone Encounter (Signed)
OK to Transfer?  Marland Kitchen

## 2018-07-23 ENCOUNTER — Other Ambulatory Visit: Payer: Self-pay | Admitting: Pharmacist Clinician (PhC)/ Clinical Pharmacy Specialist

## 2018-07-23 NOTE — Telephone Encounter (Signed)
Samples of Eliquis were given to the patient, quantity 42, Lot Number [id number]  Patient copay is $120/month in [place]

## 2018-07-27 NOTE — Telephone Encounter (Signed)
Please call to schedule TOC

## 2018-08-04 ENCOUNTER — Ambulatory Visit (INDEPENDENT_AMBULATORY_CARE_PROVIDER_SITE_OTHER): Payer: Medicare Other | Admitting: Family Medicine

## 2018-08-04 VITALS — BP 96/62 | HR 80 | Temp 97.6°F | Ht 66.0 in | Wt 238.0 lb

## 2018-08-04 DIAGNOSIS — I10 Essential (primary) hypertension: Secondary | ICD-10-CM | POA: Diagnosis not present

## 2018-08-04 DIAGNOSIS — Z6838 Body mass index (BMI) 38.0-38.9, adult: Secondary | ICD-10-CM | POA: Diagnosis not present

## 2018-08-04 NOTE — Progress Notes (Signed)
Office: [phone]  /  Fax: [phone]   HPI:   Chief Complaint: OBESITY [name] [name] is here to discuss her progress with her obesity treatment plan. She is on the Category 2 plan and is following her eating plan approximately 40 % of the time. She states she is exercising 0 minutes 0 times per week. [name] [name] is struggling with some binge like behaviors at night. She will emotionally eat large volumes of snacks.  Her weight is 238 lb (108 kg) today and has not lost weight since her last visit. She has lost 29 lbs since starting treatment with [place].  Hypertension [name] [name] is a 77 y.o. female with hypertension. [name] [name]'s blood pressure is low today. She denies dizziness, lightheadedness, or falls. She is working weight loss to help control her blood pressure with the goal of decreasing her risk of heart attack and stroke. [name] [name]'s blood pressure is not currently controlled.  Depression with emotional eating behaviors [name] [name] is struggling with a lot of boredom eating, she is on Zoloft. [name] [name] struggles with emotional eating and using food for comfort to the extent that it is negatively impacting her health. She often snacks when she is not hungry. [name] [name] sometimes feels she is out of control and then feels guilty that she made poor food choices. She has been working on behavior modification techniques to help reduce her emotional eating and has been somewhat successful. She shows no sign of suicidal or homicidal ideations.  Depression screen [name] [name] 2/9 05/07/2018 05/07/2018 01/21/2018 01/15/2018 09/17/2017  Decreased Interest 0 0 _0 Down, Depressed, Hopeless 0 0 1 0 2  PHQ - 2 Score 0 0 _1 Altered sleeping 0 0 1 0 0  Tired, decreased energy 0 0 2 0 3  Change in appetite 0 0 0 0 3  Feeling bad or failure about yourself  0 0 2 0 2  Trouble concentrating 0 0 2 0 3  Moving slowly or fidgety/restless 0 0 2 0 0  Suicidal thoughts 0 0 1 0 0  PHQ-9 Score 0 0 _2 Difficult doing work/chores -  Not difficult at all Somewhat difficult Not difficult at all Very difficult  Some recent data might be hidden    ALLERGIES: Allergies  Allergen Reactions  . Ancef [Cefazolin] Hives and Itching  . Pseudoephedrine Hcl     Other reaction(s): palpitations (moderate)  . Caffeine Palpitations    MEDICATIONS: Current Outpatient Medications on File Prior to Visit  Medication Sig Dispense Refill  . apixaban (ELIQUIS) 5 MG TABS tablet Take 1 tablet (5 mg total) by mouth 2 (two) times daily. 180 tablet 1  . Biotin 5000 MCG CAPS Take 5,000 mcg by mouth daily.     . Calcium Carbonate-Vit D-Min (CALCIUM 1200 PO) Take 2,400 mg by mouth daily.     . cholecalciferol (VITAMIN D) 1000 UNITS tablet Take 1,000 Units by mouth daily.     Marland Kitchen Co-Enzyme Q10 100 MG CAPS Take 100 mg by mouth daily.     . diazepam (VALIUM) 5 MG tablet TAKE 1 TABLET BY MOUTH ONCE DAILY AS NEEDED FOR ANXIETY 30 tablet 1  . diclofenac sodium (VOLTAREN) 1 % GEL Apply 2 g topically 4 (four) times daily as needed. 200 g 3  . fluticasone (FLONASE) 50 MCG/ACT nasal spray Place 2 sprays into both nostrils daily. 16 g 6  . folic acid (FOLVITE) 820 MCG tablet Take 400 mcg by mouth daily.    Marland Kitchen  glucosamine-chondroitin 500-400 MG tablet Take 1 tablet by mouth daily.     Marland Kitchen loratadine (KLS ALLERCLEAR) 10 MG tablet Take 10 mg by mouth daily.    Marland Kitchen MAGNESIUM CITRATE PO Take 250 mg by mouth daily. 1 tab daily    . metoprolol tartrate (LOPRESSOR) 25 MG tablet Take 0.5 tablets (12.5 mg total) by mouth 2 (two) times daily. 90 tablet 3  . nitroGLYCERIN (NITROSTAT) 0.4 MG SL tablet Place 0.4 mg under the tongue every 5 (five) minutes as needed for chest pain.    . pantoprazole (PROTONIX) 40 MG tablet TAKE 1 TABLET BY MOUTH DAILY 90 tablet 1  . Resveratrol 250 MG CAPS Take 250 mg by mouth daily.    . sertraline (ZOLOFT) 100 MG tablet TAKE 1 TABLET BY MOUTH ONCE DAILY 90 tablet 0  . Simethicone (GAS-X PO) Take 2 tablets by mouth daily as needed (bloating).      No current facility-administered medications on file prior to visit.     PAST MEDICAL HISTORY: Past Medical History:  Diagnosis Date  . Alpha-1-antitrypsin deficiency carrier ([place])   . Arthritis    "all over"  . Atrial fibrillation ([place])   . Back pain   . Complication of anesthesia    "I woke up during 2 different procedures" (10/04/2014)  . Frequent UTI   . GERD (gastroesophageal reflux disease)   . Hypertension   . MVP (mitral valve prolapse)   . Obesity   . Osteoarthritis   . Presence of permanent cardiac pacemaker   . PVC's (premature ventricular contractions)   . Shortness of breath   . 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 Sleep apnea   . Stroke [name]) 01/2014   denies deficits on 10/04/2014  . Tachy-brady syndrome ([place]) 10/2014  . TIA (transient ischemic attack)     PAST SURGICAL HISTORY: Past Surgical History:  Procedure Laterality Date  . CARDIOVERSION N/A 09/02/2017   Procedure: CARDIOVERSION;  Surgeon: Sanda Klein, MD;  Location: [facility] ENDOSCOPY;  Service: Cardiovascular;  Laterality: N/A;  . EXCISION VAGINAL CYST     benign nodules  . INSERT / REPLACE / REMOVE PACEMAKER  10/04/2014   Medtronic Advisa model [id number] serial number [id number]  . LOOP RECORDER EXPLANT N/A 10/04/2014   Procedure: LOOP RECORDER EXPLANT;  Surgeon: Sanda Klein, MD;  Location: [place] CATH LAB;  Service: Cardiovascular;  Laterality: N/A;  . LOOP RECORDER IMPLANT N/A 04/19/2014   Procedure: LOOP RECORDER IMPLANT;  Surgeon: Sanda Klein, MD;  Location: [place] CATH LAB;  Service: Cardiovascular;  Laterality: N/A;  . NM MYOCAR PERF WALL MOTION  12/18/2007   normal  . PERMANENT PACEMAKER INSERTION N/A 10/04/2014   Procedure: PERMANENT PACEMAKER INSERTION;  Surgeon: Sanda Klein, MD;  Location: [place] CATH LAB;  Service: Cardiovascular;  Laterality: N/A;  . TUBAL LIGATION    . US ECHOCARDIOGRAPHY  05/15/2010   LA mildly dilated,mild mitral annular ca+,  AOV mildly sclerotic, mild asymmetric LVH    SOCIAL HISTORY: Social History   Tobacco Use  . Smoking status: Former Smoker    Packs/day: 2.00    Years: 18.00    Pack years: 36.00    Types: Cigarettes  . Smokeless tobacco: Never Used  . Tobacco comment: "quit smoking in 1971"  Substance Use Topics  . Alcohol use: Yes    Alcohol/week: 0.0 standard drinks    Comment: 10/04/2014 "might have a drink a couple times/yr"  . Drug use: No  FAMILY HISTORY: Family History  Problem Relation Age of Onset  . Heart attack Mother   . Sudden death Mother   . Depression Mother   . Stroke Father   . Heart failure Father   . Depression Father   . Heart failure Brother   . Stroke Brother   . Alpha-1 antitrypsin deficiency Daughter     ROS: Review of Systems  Constitutional: Negative for weight loss.  Neurological: Negative for dizziness.       Negative lightheadedness Negative Falls  Psychiatric/Behavioral: Positive for depression. Negative for suicidal ideas.    PHYSICAL EXAM: Blood pressure 96/62, pulse 80, temperature 97.6 F (36.4 C), temperature source Oral, height _0  (1.676 m), weight 238 lb (108 kg), SpO2 97 %. Body mass index is 38.41 kg/m. Physical Exam  Constitutional: She is oriented to person, place, and time. She appears well-developed and well-nourished.  Cardiovascular: Normal rate.  Pulmonary/Chest: Effort normal.  Musculoskeletal: Normal range of motion.  Neurological: She is oriented to person, place, and time.  Skin: Skin is warm and dry.  Psychiatric: She has a normal mood and affect. Her behavior is normal.  Vitals reviewed.   RECENT LABS AND TESTS: BMET    Component Value Date/Time   NA 142 06/03/2018 1022   K 4.5 06/03/2018 1022   CL 104 06/03/2018 1022   CO2 23 06/03/2018 1022   GLUCOSE 110 (H) 06/03/2018 1022   GLUCOSE 92 01/15/2017 1417   BUN 20 06/03/2018 1022   CREATININE 1.06 (H) 06/03/2018 1022   CREATININE 0.86 09/28/2014 1557    CALCIUM 9.3 06/03/2018 1022   GFRNONAA 51 (L) 06/03/2018 1022   GFRNONAA 56 (L) 06/06/2014 1653   GFRAA 59 (L) 06/03/2018 1022   GFRAA 65 06/06/2014 1653   Lab Results  Component Value Date   HGBA1C 5.8 (H) 06/03/2018   HGBA1C 5.8 (H) 12/24/2017   HGBA1C 6.2 (H) 09/17/2017   HGBA1C 5.9 (H) 03/09/2014   Lab Results  Component Value Date   INSULIN 17.6 06/03/2018   INSULIN 19.4 12/24/2017   INSULIN 47.7 (H) 09/17/2017   CBC    Component Value Date/Time   WBC 8.5 09/17/2017 1131   WBC 9.1 02/24/2017 1555   RBC 5.22 09/17/2017 1131   RBC 5.09 02/24/2017 1555   HGB 14.9 09/17/2017 1131   HCT 45.1 09/17/2017 1131   PLT 258 08/27/2017 1509   MCV 86 09/17/2017 1131   MCH 28.5 09/17/2017 1131   MCH 29.0 09/28/2014 1557   MCHC 33.0 09/17/2017 1131   MCHC 33.9 02/24/2017 1555   RDW 13.5 09/17/2017 1131   LYMPHSABS 2.0 09/17/2017 1131   MONOABS 1.3 (H) 02/24/2017 1555   EOSABS 0.2 09/17/2017 1131   BASOSABS 0.0 09/17/2017 1131   Iron/TIBC/Ferritin/ %Sat No results found for: IRON, TIBC, FERRITIN, IRONPCTSAT Lipid Panel     Component Value Date/Time   CHOL 176 06/03/2018 1022   TRIG 113 06/03/2018 1022   HDL 53 06/03/2018 1022   CHOLHDL 3 01/15/2017 1417   VLDL 33.6 01/15/2017 1417   LDLCALC 100 (H) 06/03/2018 1022   LDLDIRECT 81.0 07/15/2016 1419   Hepatic Function Panel     Component Value Date/Time   PROT 6.8 06/03/2018 1022   ALBUMIN 3.9 06/03/2018 1022   AST 24 06/03/2018 1022   ALT 16 06/03/2018 1022   ALKPHOS 84 06/03/2018 1022   BILITOT 0.3 06/03/2018 1022   BILIDIR 0.1 01/15/2017 1417      Component Value Date/Time   TSH 2.040  09/17/2017 1131   TSH 2.70 07/15/2016 1419   TSH 1.529 06/06/2014 1608    ASSESSMENT AND PLAN: Essential hypertension  Class 2 severe obesity with serious comorbidity and body mass index (BMI) of 38.0 to 38.9 in adult, unspecified obesity type ([place])  PLAN:  Hypertension We discussed sodium restriction, working on  healthy weight loss, and a regular exercise program as the means to achieve improved blood pressure control. Oluwatoyin agreed with this plan and agreed to follow up as directed. We will continue to monitor her blood pressure as well as her progress with the above lifestyle modifications. Talene agrees to continue taking her medications, if blood pressure is low again at next appointment I will decrease metoprolol. She will watch for signs of hypotension as she continues her lifestyle modifications. Kimiah agrees to follow up with our clinic in 2 weeks.  Depression with Emotional Eating Behaviors We discussed behavior modification techniques today to help Madalynn deal with her emotional eating and depression. Maleea agrees to continue taking her medications and we will refer her to Dr. Mallie Mussel, our bariatric psychologist. Audryana agrees to follow up with our clinic in 2 weeks.  Obesity Lagina is currently in the action stage of change. As such, her goal is to continue with weight loss efforts She has agreed to follow the Category 2 plan Wreatha has been instructed to work up to a goal of 150 minutes of combined cardio and strengthening exercise per week for weight loss and overall health benefits. We discussed the following Behavioral Modification Strategies today: increasing lean protein intake, increasing vegetables, work on meal planning and easy cooking plans, and planning for success   Alfa has agreed to follow up with our clinic in 2 weeks. She was informed of the importance of frequent follow up visits to maximize her success with intensive lifestyle modifications for her multiple health conditions.   OBESITY BEHAVIORAL INTERVENTION VISIT  Today's visit was # 18   Starting weight: 267 lbs Starting date: 09/17/17 Today's weight : 238 lbs  Today's date: 08/04/2018 Total lbs lost to date: 29 At least 15 minutes were spent on discussing the following behavioral intervention visit.   ASK: We  discussed the diagnosis of obesity with [name] today and Doralyn agreed to give [place] permission to discuss obesity behavioral modification therapy today.  ASSESS: Lillien has the diagnosis of obesity and her BMI today is 38.43 Derinda is in the action stage of change   ADVISE: Constanza was educated on the multiple health risks of obesity as well as the benefit of weight loss to improve her health. She was advised of the need for long term treatment and the importance of lifestyle modifications to improve her current health and to decrease her risk of future health problems.  AGREE: Multiple dietary modification options and treatment options were discussed and  Calla agreed to follow the recommendations documented in the above note.  ARRANGE: Ilia was educated on the importance of frequent visits to treat obesity as outlined per CMS and USPSTF guidelines and agreed to schedule her next follow up appointment today.  I, Trixie Dredge, am acting as transcriptionist for Ilene Qua, MD  I have reviewed the above documentation for accuracy and completeness, and I agree with the above. - Ilene Qua, MD
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
## 2018-08-18 ENCOUNTER — Ambulatory Visit (INDEPENDENT_AMBULATORY_CARE_PROVIDER_SITE_OTHER): Payer: Medicare Other | Admitting: Psychology

## 2018-08-18 ENCOUNTER — Ambulatory Visit (INDEPENDENT_AMBULATORY_CARE_PROVIDER_SITE_OTHER): Payer: Medicare Other | Admitting: Family Medicine

## 2018-08-18 VITALS — BP 107/72 | HR 80 | Temp 97.3°F | Ht 66.0 in | Wt 238.0 lb

## 2018-08-18 DIAGNOSIS — R7303 Prediabetes: Secondary | ICD-10-CM | POA: Diagnosis not present

## 2018-08-18 DIAGNOSIS — F331 Major depressive disorder, recurrent, moderate: Secondary | ICD-10-CM | POA: Diagnosis not present

## 2018-08-18 DIAGNOSIS — Z6838 Body mass index (BMI) 38.0-38.9, adult: Secondary | ICD-10-CM | POA: Diagnosis not present

## 2018-08-18 DIAGNOSIS — I495 Sick sinus syndrome: Secondary | ICD-10-CM | POA: Diagnosis not present

## 2018-08-19 NOTE — Progress Notes (Signed)
Office: [phone]  /  Fax: [phone]   HPI:   Chief Complaint: OBESITY [name] [name] is here to discuss her progress with her obesity treatment plan. She is on the Category 2 plan and is following her eating plan approximately 55 % of the time. She states she is exercising 0 minutes 0 times per week. [name] is boredom eating at night. She is not following the plan per say, trying to make smart choices.  Her weight is 238 lb (108 kg) today and has not lost weight since her last visit. She has lost 29 lbs since starting treatment with [place].  Sick Sinus Syndrome [name] [name] has sick sinus syndrome. She sees Cardiology, and her blood pressure and pulse are controlled.  Pre-Diabetes [name] [name] has a diagnosis of pre-diabetes based on her elevated Hgb A1c and was informed this puts her at greater risk of developing diabetes. Her last Hgb A1c and insulin were on 06/03/18, and she notes carbohydrates cravings. She is not taking metformin currently and continues to work on diet and exercise to decrease risk of diabetes. She denies nausea or hypoglycemia.  ALLERGIES: Allergies  Allergen Reactions  . Ancef [Cefazolin] Hives and Itching  . Pseudoephedrine Hcl     Other reaction(s): palpitations (moderate)  . Caffeine Palpitations    MEDICATIONS: Current Outpatient Medications on File Prior to Visit  Medication Sig Dispense Refill  . apixaban (ELIQUIS) 5 MG TABS tablet Take 1 tablet (5 mg total) by mouth 2 (two) times daily. 180 tablet 1  . Biotin 5000 MCG CAPS Take 5,000 mcg by mouth daily.     . Calcium Carbonate-Vit D-Min (CALCIUM 1200 PO) Take 2,400 mg by mouth daily.     . cholecalciferol (VITAMIN D) 1000 UNITS tablet Take 1,000 Units by mouth daily.     Marland Kitchen Co-Enzyme Q10 100 MG CAPS Take 100 mg by mouth daily.     . diazepam (VALIUM) 5 MG tablet TAKE 1 TABLET BY MOUTH ONCE DAILY AS NEEDED FOR ANXIETY 30 tablet 1  . diclofenac sodium (VOLTAREN) 1 % GEL Apply 2 g topically 4 (four) times daily as  needed. 200 g 3  . fluticasone (FLONASE) 50 MCG/ACT nasal spray Place 2 sprays into both nostrils daily. 16 g 6  . folic acid (FOLVITE) 211 MCG tablet Take 400 mcg by mouth daily.    Marland Kitchen glucosamine-chondroitin 500-400 MG tablet Take 1 tablet by mouth daily.     Marland Kitchen loratadine (KLS ALLERCLEAR) 10 MG tablet Take 10 mg by mouth daily.    Marland Kitchen MAGNESIUM CITRATE PO Take 250 mg by mouth daily. 1 tab daily    . metoprolol tartrate (LOPRESSOR) 25 MG tablet Take 0.5 tablets (12.5 mg total) by mouth 2 (two) times daily. 90 tablet 3  . nitroGLYCERIN (NITROSTAT) 0.4 MG SL tablet Place 0.4 mg under the tongue every 5 (five) minutes as needed for chest pain.    . pantoprazole (PROTONIX) 40 MG tablet TAKE 1 TABLET BY MOUTH DAILY 90 tablet 1  . Resveratrol 250 MG CAPS Take 250 mg by mouth daily.    . sertraline (ZOLOFT) 100 MG tablet TAKE 1 TABLET BY MOUTH ONCE DAILY 90 tablet 0  . Simethicone (GAS-X PO) Take 2 tablets by mouth daily as needed (bloating).     No current facility-administered medications on file prior to visit.     PAST MEDICAL HISTORY: Past Medical History:  Diagnosis Date  . Alpha-1-antitrypsin deficiency carrier ([place])   . Arthritis    "all over"  . Atrial  fibrillation ([place])   . Back pain   . Complication of anesthesia    "I woke up during 2 different procedures" (10/04/2014)  . Frequent UTI   . GERD (gastroesophageal reflux disease)   . Hypertension   . MVP (mitral valve prolapse)   . Obesity   . Osteoarthritis   . Presence of permanent cardiac pacemaker   . PVC's (premature ventricular contractions)   . Shortness of breath   . 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 Sleep apnea   . Stroke [facility]) 01/2014   denies deficits on 10/04/2014  . Tachy-brady syndrome ([place]) 10/2014  . TIA (transient ischemic attack)     PAST SURGICAL HISTORY: Past Surgical History:  Procedure Laterality Date  . CARDIOVERSION N/A 09/02/2017   Procedure:  CARDIOVERSION;  Surgeon: Sanda Klein, MD;  Location: [facility] ENDOSCOPY;  Service: Cardiovascular;  Laterality: N/A;  . EXCISION VAGINAL CYST     benign nodules  . INSERT / REPLACE / REMOVE PACEMAKER  10/04/2014   Medtronic Advisa model [id number] serial number [id number]  . LOOP RECORDER EXPLANT N/A 10/04/2014   Procedure: LOOP RECORDER EXPLANT;  Surgeon: Sanda Klein, MD;  Location: [place] CATH LAB;  Service: Cardiovascular;  Laterality: N/A;  . LOOP RECORDER IMPLANT N/A 04/19/2014   Procedure: LOOP RECORDER IMPLANT;  Surgeon: Sanda Klein, MD;  Location: [place] CATH LAB;  Service: Cardiovascular;  Laterality: N/A;  . NM MYOCAR PERF WALL MOTION  12/18/2007   normal  . PERMANENT PACEMAKER INSERTION N/A 10/04/2014   Procedure: PERMANENT PACEMAKER INSERTION;  Surgeon: Sanda Klein, MD;  Location: [place] CATH LAB;  Service: Cardiovascular;  Laterality: N/A;  . TUBAL LIGATION    . US ECHOCARDIOGRAPHY  05/15/2010   LA mildly dilated,mild mitral annular ca+, AOV mildly sclerotic, mild asymmetric LVH    SOCIAL HISTORY: Social History   Tobacco Use  . Smoking status: Former Smoker    Packs/day: 2.00    Years: 18.00    Pack years: 36.00    Types: Cigarettes  . Smokeless tobacco: Never Used  . Tobacco comment: "quit smoking in 1971"  Substance Use Topics  . Alcohol use: Yes    Alcohol/week: 0.0 standard drinks    Comment: 10/04/2014 "might have a drink a couple times/yr"  . Drug use: No    FAMILY HISTORY: Family History  Problem Relation Age of Onset  . Heart attack Mother   . Sudden death Mother   . Depression Mother   . Stroke Father   . Heart failure Father   . Depression Father   . Heart failure Brother   . Stroke Brother   . Alpha-1 antitrypsin deficiency Daughter     ROS: Review of Systems  Constitutional: Negative for weight loss.  Gastrointestinal: Negative for nausea.  Endo/Heme/Allergies:       Negative hypoglycemia    PHYSICAL EXAM: Blood pressure 107/72, pulse 80,  temperature (!) 97.3 F (36.3 C), temperature source Oral, height 5' 6" (1.676 m), weight 238 lb (108 kg), SpO2 97 %. Body mass index is 38.41 kg/m. Physical Exam  Constitutional: She is oriented to person, place, and time. She appears well-developed and well-nourished.  Cardiovascular: Normal rate.  Pulmonary/Chest: Effort normal.  Musculoskeletal: Normal range of motion.  Neurological: She is oriented to person, place, and time.  Skin: Skin is warm and dry.  Psychiatric: She has a normal mood and affect. Her behavior is normal.  Vitals reviewed.   RECENT LABS AND TESTS: BMET  Component Value Date/Time   NA 142 06/03/2018 1022   K 4.5 06/03/2018 1022   CL 104 06/03/2018 1022   CO2 23 06/03/2018 1022   GLUCOSE 110 (H) 06/03/2018 1022   GLUCOSE 92 01/15/2017 1417   BUN 20 06/03/2018 1022   CREATININE 1.06 (H) 06/03/2018 1022   CREATININE 0.86 09/28/2014 1557   CALCIUM 9.3 06/03/2018 1022   GFRNONAA 51 (L) 06/03/2018 1022   GFRNONAA 56 (L) 06/06/2014 1653   GFRAA 59 (L) 06/03/2018 1022   GFRAA 65 06/06/2014 1653   Lab Results  Component Value Date   HGBA1C 5.8 (H) 06/03/2018   HGBA1C 5.8 (H) 12/24/2017   HGBA1C 6.2 (H) 09/17/2017   HGBA1C 5.9 (H) 03/09/2014   Lab Results  Component Value Date   INSULIN 17.6 06/03/2018   INSULIN 19.4 12/24/2017   INSULIN 47.7 (H) 09/17/2017   CBC    Component Value Date/Time   WBC 8.5 09/17/2017 1131   WBC 9.1 02/24/2017 1555   RBC 5.22 09/17/2017 1131   RBC 5.09 02/24/2017 1555   HGB 14.9 09/17/2017 1131   HCT 45.1 09/17/2017 1131   PLT 258 08/27/2017 1509   MCV 86 09/17/2017 1131   MCH 28.5 09/17/2017 1131   MCH 29.0 09/28/2014 1557   MCHC 33.0 09/17/2017 1131   MCHC 33.9 02/24/2017 1555   RDW 13.5 09/17/2017 1131   LYMPHSABS 2.0 09/17/2017 1131   MONOABS 1.3 (H) 02/24/2017 1555   EOSABS 0.2 09/17/2017 1131   BASOSABS 0.0 09/17/2017 1131   Iron/TIBC/Ferritin/ %Sat No results found for: IRON, TIBC, FERRITIN,  IRONPCTSAT Lipid Panel     Component Value Date/Time   CHOL 176 06/03/2018 1022   TRIG 113 06/03/2018 1022   HDL 53 06/03/2018 1022   CHOLHDL 3 01/15/2017 1417   VLDL 33.6 01/15/2017 1417   LDLCALC 100 (H) 06/03/2018 1022   LDLDIRECT 81.0 07/15/2016 1419   Hepatic Function Panel     Component Value Date/Time   PROT 6.8 06/03/2018 1022   ALBUMIN 3.9 06/03/2018 1022   AST 24 06/03/2018 1022   ALT 16 06/03/2018 1022   ALKPHOS 84 06/03/2018 1022   BILITOT 0.3 06/03/2018 1022   BILIDIR 0.1 01/15/2017 1417      Component Value Date/Time   TSH 2.040 09/17/2017 1131   TSH 2.70 07/15/2016 1419   TSH 1.529 06/06/2014 1608    ASSESSMENT AND PLAN: SSS (sick sinus syndrome) (HCC)  Prediabetes  Class 2 severe obesity with serious comorbidity and body mass index (BMI) of 38.0 to 38.9 in adult, unspecified obesity type [facility])  PLAN:  Sick Sinus Syndrome Maniyah is to follow up with her Cardiologist as needed, and she agrees follow up with our clinic in 2 weeks.  Pre-Diabetes Zury will continue with portion control and smarter food choices, and will continue to work on weight loss, exercise, and decreasing simple carbohydrates in her diet to help decrease the risk of diabetes. We dicussed metformin including benefits and risks. She was informed that eating too many simple carbohydrates or too many calories at one sitting increases the likelihood of GI side effects. Deletha declined metformin for now and a prescription was not written today. Maleeyah agrees to follow up with our clinic in 2 weeks as directed to monitor her progress.  I spent > than 50% of the 15 minute visit on counseling as documented in the note.  Obesity Ilse is currently in the action stage of change. As such, her goal is to continue with weight  loss efforts She has agreed to portion control better and make smarter food choices, such as increase vegetables and decrease simple carbohydrates  Veronique has been  instructed to work up to a goal of 150 minutes of combined cardio and strengthening exercise per week for weight loss and overall health benefits. We discussed the following Behavioral Modification Strategies today: increasing lean protein intake, increasing vegetables, work on meal planning and easy cooking plans, and planning for success   Sidney has agreed to follow up with our clinic in 2 weeks. She was informed of the importance of frequent follow up visits to maximize her success with intensive lifestyle modifications for her multiple health conditions.   OBESITY BEHAVIORAL INTERVENTION VISIT  Today's visit was # 19   Starting weight: 267 lbs Starting date: 09/17/17 Today's weight : 238 lbs  Today's date: 08/18/2018 Total lbs lost to date: 47    ASK: We discussed the diagnosis of obesity with [name] today and Emara agreed to give [place] permission to discuss obesity behavioral modification therapy today.  ASSESS: Liseth has the diagnosis of obesity and her BMI today is 38.43 Divine is in the action stage of change   ADVISE: Amparo was educated on the multiple health risks of obesity as well as the benefit of weight loss to improve her health. She was advised of the need for long term treatment and the importance of lifestyle modifications to improve her current health and to decrease her risk of future health problems.  AGREE: Multiple dietary modification options and treatment options were discussed and  Makaylah agreed to follow the recommendations documented in the above note.  ARRANGE: Naveyah was educated on the importance of frequent visits to treat obesity as outlined per CMS and USPSTF guidelines and agreed to schedule her next follow up appointment today.  I, Trixie Dredge, am acting as transcriptionist for Ilene Qua, MD  I have reviewed the above documentation for accuracy and completeness, and I agree with the above. - Ilene Qua, MD

## 2018-08-21 ENCOUNTER — Encounter: Payer: Self-pay | Admitting: Family Medicine

## 2018-08-21 ENCOUNTER — Ambulatory Visit (INDEPENDENT_AMBULATORY_CARE_PROVIDER_SITE_OTHER): Payer: Medicare Other | Admitting: Family Medicine

## 2018-08-21 VITALS — BP 122/70 | HR 66 | Temp 97.7°F | Ht 66.0 in | Wt 239.0 lb

## 2018-08-21 DIAGNOSIS — R0982 Postnasal drip: Secondary | ICD-10-CM | POA: Diagnosis not present

## 2018-08-21 DIAGNOSIS — J0100 Acute maxillary sinusitis, unspecified: Secondary | ICD-10-CM

## 2018-08-21 MED ORDER — FLUTICASONE PROPIONATE 50 MCG/ACT NA SUSP: 2.0000 | Freq: Every day | NASAL | 6 refills | Status: DC

## 2018-08-21 MED ORDER — AZITHROMYCIN 250 MG PO TABS: ORAL_TABLET | ORAL | 0 refills | Status: DC

## 2018-08-21 NOTE — Progress Notes (Signed)
Patient: [name] [name] MRN: [id number] DOB: [birth year] PCP: Midge Minium, MD     Subjective:  Chief Complaint  Patient presents with  . Sore Throat  . Cough  . Sinusitis    HPI: The patient is a 77 y.o. female who presents today for sore throat, cough and some sinus symptoms. She stated symptoms started yesterday. She woke up with a sore throat yesterday. She has a chronic cough, but she feels like she is coughing more today. She has pressure/pain in her sinuses and her right ear feels "pressure or something." No fever, but does feel cold at times. No sick contacts that she is aware of. She is producing more mucous than normal as well. She has a history of pulmonary fibrosis. No asthma and does not smoke. No second hand smoke. She has used [facility] allergy spray for her nose yesterday and today. It has helped her in the past, but not really helping her today. She has not been using her inhalers due to thrush. No shortness of breath and no wheezing. She does wear a cpap at night. No oxygen during the day.   Review of Systems  Constitutional: Positive for chills and fatigue. Negative for fever.  HENT: Positive for congestion, ear pain, rhinorrhea, sinus pressure, sinus pain and sore throat.   Respiratory: Positive for cough and shortness of breath.   Cardiovascular: Negative for chest pain.  Gastrointestinal: Negative for abdominal pain and nausea.  Musculoskeletal: Positive for neck pain. Negative for back pain.       Pt has arthritis  Neurological: Positive for light-headedness. Negative for headaches.  Psychiatric/Behavioral: Negative for sleep disturbance.    Allergies Patient is allergic to ancef [cefazolin]; pseudoephedrine hcl; and caffeine.  Past Medical History Patient  has a past medical history of Alpha-1-antitrypsin deficiency carrier ([place]), Arthritis, Atrial fibrillation ([place]), Back pain, Complication of anesthesia, Frequent UTI, GERD (gastroesophageal reflux  disease), Hypertension, MVP (mitral valve prolapse), Obesity, Osteoarthritis, Presence of permanent cardiac pacemaker, PVC's (premature ventricular contractions), Shortness of breath, Sinus arrest (10/2014), Sleep apnea, Stroke ([place]) (01/2014), Tachy-brady syndrome ([place]) (10/2014), and TIA (transient ischemic attack).  Surgical History Patient  has a past surgical history that includes Excision vaginal cyst; Tubal ligation; US ECHOCARDIOGRAPHY (05/15/2010); NM MYOCAR PERF WALL MOTION (12/18/2007); Insert / replace / remove pacemaker (10/04/2014); loop recorder implant (N/A, 04/19/2014); loop recorder explant (N/A, 10/04/2014); permanent pacemaker insertion (N/A, 10/04/2014); and Cardioversion (N/A, 09/02/2017).  Family History Pateint's family history includes Alpha-1 antitrypsin deficiency in her daughter; Depression in her father and mother; Heart attack in her mother; Heart failure in her brother and father; Stroke in her brother and father; Sudden death in her mother.  Social History Patient  reports that she has quit smoking. Her smoking use included cigarettes. She has a 36.00 pack-year smoking history. She has never used smokeless tobacco. She reports that she drinks alcohol. She reports that she does not use drugs.    Objective: Vitals:   08/21/18 1425  BP: 122/70  Pulse: 66  Temp: 97.7 F (36.5 C)  TempSrc: Oral  SpO2: 96%  Weight: 239 lb (108.4 kg)  Height: _0  (1.676 m)    Body mass index is 38.58 kg/m.  Physical Exam  Constitutional: She appears well-developed and well-nourished.  Non-toxic appearance. She does not appear ill.  HENT:  Right Ear: Hearing, tympanic membrane and ear canal normal.  Left Ear: Hearing, tympanic membrane and ear canal normal.  Mouth/Throat: Mucous membranes are normal. Posterior oropharyngeal erythema present. No  posterior oropharyngeal edema. Tonsils are 0 on the right. Tonsils are 0 on the left. No tonsillar exudate.  Nasal turbinates with mild  edema bilaterally  Quite significant maxillary sinus tenderness on palpation bilaterally   Cardiovascular: Normal rate, regular rhythm and normal heart sounds.  Pulmonary/Chest: Effort normal and breath sounds normal. She has no wheezes.  Lymphadenopathy:    She has cervical adenopathy.  Vitals reviewed.      Assessment/plan: 1. Acute non-recurrent maxillary sinusitis Will do zpack since she has anapylaxis with the anceph. Can also do a zyrtec with the flonase. F/u with pcp if not getting better.   2. Post-nasal drip She has flonase on her med list, but states she has none of this at home. Will send this in and have her stop whatever she bought at the store since she can't tell me what she got. Likely cause of her sore throat.    Return if symptoms worsen or fail to improve.   Orma Flaming, MD [place]   08/21/2018

## 2018-08-25 ENCOUNTER — Ambulatory Visit (INDEPENDENT_AMBULATORY_CARE_PROVIDER_SITE_OTHER): Payer: Medicare Other | Admitting: Primary Care

## 2018-08-25 ENCOUNTER — Other Ambulatory Visit: Payer: Self-pay | Admitting: Cardiovascular Disease

## 2018-08-25 ENCOUNTER — Encounter: Payer: Self-pay | Admitting: Primary Care

## 2018-08-25 VITALS — BP 92/62 | HR 71 | Ht 67.0 in | Wt 239.0 lb

## 2018-08-25 DIAGNOSIS — J0100 Acute maxillary sinusitis, unspecified: Secondary | ICD-10-CM

## 2018-08-25 DIAGNOSIS — J302 Other seasonal allergic rhinitis: Secondary | ICD-10-CM | POA: Diagnosis not present

## 2018-08-25 DIAGNOSIS — J40 Bronchitis, not specified as acute or chronic: Secondary | ICD-10-CM

## 2018-08-25 DIAGNOSIS — J3089 Other allergic rhinitis: Secondary | ICD-10-CM

## 2018-08-25 DIAGNOSIS — I959 Hypotension, unspecified: Secondary | ICD-10-CM | POA: Insufficient documentation

## 2018-08-25 MED ORDER — LEVALBUTEROL HCL 0.63 MG/3ML IN NEBU
0.6300 mg | INHALATION_SOLUTION | RESPIRATORY_TRACT | Status: AC
  Administered 2018-08-25: 0.63 mg via RESPIRATORY_TRACT

## 2018-08-25 MED ORDER — DOXYCYCLINE HYCLATE 100 MG PO TABS: 100.0000 mg | ORAL_TABLET | Freq: Two times a day (BID) | ORAL | 0 refills | Status: DC

## 2018-08-25 MED ORDER — AEROCHAMBER MV MISC: 0 refills | Status: DC

## 2018-08-25 NOTE — Progress Notes (Signed)
_0  ID: [name], female    DOB: [birth year], 77 y.o.   MRN: [id number]  Chief Complaint  Patient presents with  . Acute Visit    coughing up green mucus x1 days, chest congestion    Referring provider: Midge Minium, MD  HPI: 77 year old female, former smoker (hx 36 pack years). PMH dyspnea on exertion, pulmonary fibrosis, lung nodules, OSA on CPAP, GERD, pAFib. Patient of Dr. Annamaria Boots, last seen in April 2019.   08/25/2018 Patient presents today for acute visit with complaints of cough and sinus congestion. Saw PCP last week for acute sinusitis treated with zpack, she reports that she started feeling better until today. Cough is productive with green tinge mucus. Associated sinus pressure and ear fullness. Breathing is ok, some shortness of breath which is not abnormal for her d/t fibrosis. She has completed pulmonary rehab. Used albuterol rescue inhaler in the past but got thrush. Denies fever, chills, wheeze.   Testing reviewed:  CT chest 01/06/17- pulmonary fibrosis, small nodules PFT 02/24/2017- moderate restriction, moderately severe diffusion deficit FVC 2.45/81%, FEV1 2.14/94%, ratio 0.87, TLC 62%, DLCO 59% Walk Test on room air 02/24/2017-94%, 93%, 92%, 92%. She does not qualify for portable oxygen ANA 1:160  Allergies  Allergen Reactions  . Ancef [Cefazolin] Hives and Itching  . Pseudoephedrine Hcl     Other reaction(s): palpitations (moderate)  . Caffeine Palpitations    Immunization History  Administered Date(s) Administered  . Influenza Whole 09/23/2008  . Influenza,inj,quad, With Preservative 09/04/2017  . Influenza-Unspecified 09/17/2014, 09/09/2016, 09/04/2017  . Pneumococcal Conjugate-13 10/16/2015  . Pneumococcal Polysaccharide-23 10/02/2017  . Pneumococcal-Unspecified 10/01/2014, 10/02/2017    Past Medical History:  Diagnosis Date  . Alpha-1-antitrypsin deficiency carrier ([place])   . Arthritis    "all over"  . Atrial fibrillation ([place])   .  Back pain   . Complication of anesthesia    "I woke up during 2 different procedures" (10/04/2014)  . Frequent UTI   . GERD (gastroesophageal reflux disease)   . Hypertension   . MVP (mitral valve prolapse)   . Obesity   . Osteoarthritis   . Presence of permanent cardiac pacemaker   . PVC's (premature ventricular contractions)   . Shortness of breath   . 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 Sleep apnea   . Stroke [facility]) 01/2014   denies deficits on 10/04/2014  . Tachy-brady syndrome ([place]) 10/2014  . TIA (transient ischemic attack)     Tobacco History: Social History   Tobacco Use  Smoking Status Former Smoker  . Packs/day: 2.00  . Years: 18.00  . Pack years: 36.00  . Types: Cigarettes  Smokeless Tobacco Never Used  Tobacco Comment   "quit smoking in 1971"   Counseling given: Not Answered Comment: "quit smoking in 1971"   Outpatient Medications Prior to Visit  Medication Sig Dispense Refill  . apixaban (ELIQUIS) 5 MG TABS tablet Take 1 tablet (5 mg total) by mouth 2 (two) times daily. 180 tablet 1  . Biotin 5000 MCG CAPS Take 5,000 mcg by mouth daily.     . Calcium Carbonate-Vit D-Min (CALCIUM 1200 PO) Take 2,400 mg by mouth daily.     . cholecalciferol (VITAMIN D) 1000 UNITS tablet Take 1,000 Units by mouth daily.     Marland Kitchen Co-Enzyme Q10 100 MG CAPS Take 100 mg by mouth daily.     . diazepam (VALIUM) 5 MG tablet TAKE 1 TABLET BY MOUTH  ONCE DAILY AS NEEDED FOR ANXIETY 30 tablet 1  . diclofenac sodium (VOLTAREN) 1 % GEL Apply 2 g topically 4 (four) times daily as needed. 200 g 3  . fluticasone (FLONASE) 50 MCG/ACT nasal spray Place 2 sprays into both nostrils daily. 16 g 6  . folic acid (FOLVITE) 333 MCG tablet Take 400 mcg by mouth daily.    Marland Kitchen glucosamine-chondroitin 500-400 MG tablet Take 1 tablet by mouth daily.     Marland Kitchen loratadine (KLS ALLERCLEAR) 10 MG tablet Take 10 mg by mouth daily.    Marland Kitchen MAGNESIUM CITRATE PO Take 250 mg by  mouth daily. 1 tab daily    . metoprolol tartrate (LOPRESSOR) 25 MG tablet Take 0.5 tablets (12.5 mg total) by mouth 2 (two) times daily. Patient needs to schedule appointment for refill 90 tablet 3  . nitroGLYCERIN (NITROSTAT) 0.4 MG SL tablet Place 0.4 mg under the tongue every 5 (five) minutes as needed for chest pain.    . pantoprazole (PROTONIX) 40 MG tablet TAKE 1 TABLET BY MOUTH DAILY 90 tablet 1  . Resveratrol 250 MG CAPS Take 250 mg by mouth daily.    . sertraline (ZOLOFT) 100 MG tablet TAKE 1 TABLET BY MOUTH ONCE DAILY 90 tablet 0  . Simethicone (GAS-X PO) Take 2 tablets by mouth daily as needed (bloating).    . Triamcinolone Acetonide ([employer] NASAL ALLERGY SPRAY NA) Place into the nose.    Marland Kitchen azithromycin (ZITHROMAX) 250 MG tablet 2 pills today and then 1 pill days 2-5. 6 tablet 0  . fluticasone (FLONASE) 50 MCG/ACT nasal spray Place 2 sprays into both nostrils daily. 16 g 6   No facility-administered medications prior to visit.     Review of Systems  Review of Systems  Constitutional: Negative.   HENT: Positive for congestion, postnasal drip and sinus pressure.   Respiratory: Positive for cough and shortness of breath. Negative for apnea, choking, chest tightness, wheezing and stridor.   Cardiovascular: Negative.     Physical Exam  BP 92/62 (BP Location: Right Arm, Cuff Size: Normal)   Pulse 71   Ht _0  (1.702 m)   Wt 239 lb (108.4 kg)   LMP  (LMP Unknown)   SpO2 96%   BMI 37.43 kg/m  Physical Exam  Constitutional: She is oriented to person, place, and time. She appears well-developed and well-nourished. No distress.  HENT:  Head: Normocephalic and atraumatic.  Nose: Right sinus exhibits maxillary sinus tenderness. Left sinus exhibits maxillary sinus tenderness.  Eyes: Pupils are equal, round, and reactive to light. EOM are normal.  Neck: Normal range of motion. Neck supple.  Cardiovascular: Normal rate and regular rhythm.  Pulmonary/Chest: Effort normal and breath  sounds normal. No stridor. No respiratory distress. She has no wheezes. She has no rales.  Musculoskeletal: Normal range of motion.  Neurological: She is alert and oriented to person, place, and time.  Skin: Skin is warm and dry.  Psychiatric: She has a normal mood and affect. Her behavior is normal. Judgment and thought content normal.     Lab Results:  CBC    Component Value Date/Time   WBC 8.5 09/17/2017 1131   WBC 9.1 02/24/2017 1555   RBC 5.22 09/17/2017 1131   RBC 5.09 02/24/2017 1555   HGB 14.9 09/17/2017 1131   HCT 45.1 09/17/2017 1131   PLT 258 08/27/2017 1509   MCV 86 09/17/2017 1131   MCH 28.5 09/17/2017 1131   MCH 29.0 09/28/2014 1557   MCHC 33.0 09/17/2017  1131   MCHC 33.9 02/24/2017 1555   RDW 13.5 09/17/2017 1131   LYMPHSABS 2.0 09/17/2017 1131   MONOABS 1.3 (H) 02/24/2017 1555   EOSABS 0.2 09/17/2017 1131   BASOSABS 0.0 09/17/2017 1131    BMET    Component Value Date/Time   NA 142 06/03/2018 1022   K 4.5 06/03/2018 1022   CL 104 06/03/2018 1022   CO2 23 06/03/2018 1022   GLUCOSE 110 (H) 06/03/2018 1022   GLUCOSE 92 01/15/2017 1417   BUN 20 06/03/2018 1022   CREATININE 1.06 (H) 06/03/2018 1022   CREATININE 0.86 09/28/2014 1557   CALCIUM 9.3 06/03/2018 1022   GFRNONAA 51 (L) 06/03/2018 1022   GFRNONAA 56 (L) 06/06/2014 1653   GFRAA 59 (L) 06/03/2018 1022   GFRAA 65 06/06/2014 1653    BNP No results found for: BNP  ProBNP    Component Value Date/Time   PROBNP 99.0 12/10/2016 1724    Imaging: No results found.   Assessment & Plan:   Sinusitis, acute maxillary - Rx doxycyline BID x 7 days  - Advised Flonase and nasal sinus rinse - FU in 1 week   Bronchitis - Exacerbated by sinusitis symptoms  - Lungs CTA, O2 sat 96% RA - Advise Mucinex and delsym OTC twice daily  - Xopenex neb x1 today with no real improvement - Denies allergy to Albuterol, holding off on rescue inhaler treatment at this time - FU in 1 week, consider CXR and  prednisone taper if not better    Hypotension - BP 92/62 today, patient states this is around her baseline- reviewed flow chart blood pressure in September 96/62, 107/72.  - Advised to hold metoprolol if SBP <100. Patient able to check blood pressure at home.      Martyn Ehrich, NP 08/25/2018

## 2018-08-25 NOTE — Assessment & Plan Note (Addendum)
-  Rx doxycyline BID x 7 days  - Advised Flonase and nasal sinus rinse - FU in 1 week

## 2018-08-25 NOTE — Assessment & Plan Note (Addendum)
-  Exacerbated by sinusitis symptoms  - Lungs CTA, O2 sat 96% RA - Advise Mucinex and delsym OTC twice daily  - Xopenex neb x1 today with no real improvement - Denies allergy to Albuterol, holding off on rescue inhaler treatment at this time - FU in 1 week, consider CXR and prednisone taper if not better

## 2018-08-25 NOTE — Patient Instructions (Addendum)
Doxycyline twice daily x 7 days Mucinex and delsym cough syrup twice a day  Flonase nasal spray and nasal irrigation  FU in 7-10 days with [facility] NP or sooner if needed

## 2018-08-25 NOTE — Assessment & Plan Note (Addendum)
-  BP 92/62 today, patient states this is around her baseline- reviewed flow chart blood pressure in September 96/62, 107/72.  - Advised to hold metoprolol if SBP <100. Patient able to check blood pressure at home.

## 2018-08-27 ENCOUNTER — Telehealth: Payer: Self-pay | Admitting: Primary Care

## 2018-08-27 MED ORDER — ALBUTEROL SULFATE HFA 108 (90 BASE) MCG/ACT IN AERS: 2.0000 | INHALATION_SPRAY | Freq: Four times a day (QID) | RESPIRATORY_TRACT | 6 refills | Status: DC | PRN

## 2018-08-27 NOTE — Progress Notes (Unsigned)
Office: [phone]  /  Fax: [phone]   Date: September 01, 2018 Time Seen:*** Duration:*** Provider: Glennie Isle, Psy.D. Type of Session: Individual Therapy   HPI: [name] [name] [name] was referred by [name] [name] [name] due to depression with emotional eating behaviors. She was seen for an initial appointment with this provider on August 18, 2018. Per the note for the visit with [name] [name] [name] on August 04, 2018, "[name] [name] [name] struggling with a lot of boredom eating, she is on Zoloft. [name] [name] [name] struggles withemotional eating and using food for comfort to the extent that it is negatively impactingherhealth. Sheoften snacks when sheis not hungry. [name] [name] [name] feels sheis out of control and then feels guilty that shemade poor food choices. [name] [name] [name] on behavior modification techniques to help reduce heremotional eating and has [name] [name] somewhat successful.Sheshows no sign of suicidal or homicidal ideations."  In addition, [name] [name] [name] was initially seen at this clinic by Dr. Dennard [name] [name] on September 17, 2017.  During that initial appointment, partner shared that she started gaining weight two and a half years ago and her heaviest weight ever was 274 pounds. [name] [name] [name] reported experiencing the following: significant food craving issues; snacking frequently in the evening; frequently making poor food choices; frequently eating larger portions than normal; binge eating behaviors; and struggling with emotional eating. [name] [name] [name] also disclosed she is a picky eater and does not like to eat healthier foods. Moreover, during the initial appointment with this provider, [name] [name] [name] shared, "Dr. Adair [name] [name] thinks I'm depressed and I'm not really sure that is right." She further explained, "Sometimes, I just eat ferociously. I binge." She described experiencing a "preoccupation with food." [name] [name] [name] shared her last binge was the night before the initial appointment with this provider, which consisted of "five or six fig  bars and some popcorn" followed by "two stuffed peppers and two chocolate popsicles" over the course of four hours. She clarified her binge eating behaviors are usually in the evening, specifically after dinner. [name] [name] [name] described craving sweets. [name] [name] [name] indicated binge eating behaviors starting in childhood. She denied a history of purging and engaging in any other compensatory behaviors. She denied a history of a eating disorder diagnosis. Furthermore, [name] [name] [name] was asked to complete a questionnaire assessing various behaviors related to emotional eating. [name] [name] [name] endorsed the following: overeat when you are celebrating, eat certain foods when you are anxious, stressed, depressed, or your feelings are hurt, use food to help you cope with emotional situations, find food is comforting to you, overeat when you are worried about something, overeat frequently when you are bored or lonely and overeat when you are alone, but eat much less when you are with other people.  Session Content: Session focused on the following treatment goal: decrease emotional eating. The session was initiated with the administration of the PHQ-9 and GAD-7, as well as a brief check-in.   [name] [name] [name] was receptive to today's session as evidenced by ***.  Mental Status Examination: [name] [name] [name] arrived on time for the appointment. She presented as appropriately dressed and groomed. [name] [name] [name] appeared her stated age and demonstrated adequate orientation to time, place, person, and purpose of the appointment. She also demonstrated appropriate eye contact. No psychomotor abnormalities or behavioral peculiarities noted. Her mood was {gbmood:21757} with congruent affect. Her thought processes were logical, linear, and goal-directed. No hallucinations, delusions, bizarre thinking or behavior reported or observed. Judgment, insight, and impulse control appeared to be grossly intact. There was no evidence of paraphasias (i.e., errors in speech, gross  mispronunciations, and word substitutions), repetition deficits, or disturbances in volume  or prosody (i.e., rhythm and intonation). There was no evidence of attention or memory impairments. [name] [name] [name] denied current suicidal and homicidal ideation, intent or plan.  Structured Assessment Results: The Patient Health Questionnaire-9 (PHQ-9) is a self-report measure that assesses symptoms and severity of depression over the course of the last two weeks. [name] [name] obtained a score of *** suggesting {GBPHQ9SEVERITY:21752}. [name] [name] [name] finds the endorsed symptoms to be {gbphq9difficulty:21754}.  The Generalized Anxiety Disorder-7 (GAD-7) is a brief self-report measure that assesses symptoms of anxiety over the course of the last two weeks. [name] [name] [name] obtained a score of *** suggesting {gbgad7severity:21753}.  Interventions: [name] [name] [name] was administered the PHQ-9 and GAD-7 for symptom monitoring. Content from the last session was reviewed. Throughout today's session, empathic reflections and validation were provided. Psychoeducation regarding *** was provided and *** [insert other interventions].   DSM-5 Diagnosis: 296.32 (F33.1) Major Depressive Disorder, Recurrent Episode, Moderate, With Anxious Distress  Treatment Goal & Progress: [name] [name] [name] was seen for an initial appointment with this provider on August 18, 2018 during which the following treatment goal was established: decrease emotional eating. [name] [name] [name] has demonstrated progress in her goal of *** as evidenced by ***  Plan: [name] [name] continues to appear able and willing to participate as evidenced by engagement in reciprocal conversation, and asking questions for clarification as appropriate.*** The next appointment will be scheduled in {gbweeks:21758}. The next session will focus on reviewing learned skills, and [name] [name] towards the established treatment goal.***

## 2018-08-27 NOTE — Telephone Encounter (Signed)
Spoke with the patient who was upset and was that she didn't have any albuterol sent to the pharmacy.   Pre Derl Barrow Np AVS -Exacerbated by sinusitis symptoms  - Lungs CTA, O2 sat 96% RA - Advise Mucinex and delsym OTC twice daily  - Xopenex neb x1 today with no real improvement - Denies allergy to Albuterol, holding off on rescue inhaler treatment at this time - FU in 1 week, consider CXR and prednisone taper if not better    Patient is aware of this and nothing further in needed at this time.

## 2018-08-28 NOTE — Telephone Encounter (Signed)
Duplicate message

## 2018-08-31 ENCOUNTER — Telehealth: Payer: Self-pay | Admitting: Internal Medicine

## 2018-08-31 MED ORDER — PREDNISONE 10 MG PO TABS: ORAL_TABLET | ORAL | 0 refills | Status: DC

## 2018-08-31 NOTE — Telephone Encounter (Signed)
Called and spoke with patient, advised her of BW response. Patient verbalized understanding. Nothing further needed.

## 2018-08-31 NOTE — Telephone Encounter (Signed)
Called patient . States she has head congestion and chest congestion. Was placed on an antibiotic 3 weeks ago by her PCP. She was placed on Doxycyline twice daily x 7 days per Derl Barrow NP at [facility] 08/25/2018.  Plenty of wheezing, no chest tightness, coughing up yellow/green sputum.   Patient states she feels physically fine just congested all over.  Patient verified allergies and [facility] on [address] as her pharmacy. Allergies  Allergen Reactions  . Ancef [Cefazolin] Hives and Itching  . Pseudoephedrine Hcl     Other reaction(s): palpitations (moderate)  . Caffeine Palpitations    Beth Please advise

## 2018-08-31 NOTE — Telephone Encounter (Signed)
She needs to take mucinex and use flonase nasal spray. She should also do nasal sinus rinses. I can send in prednisone taper for her. Otherwise, if not better needs to come in for visit and have CXR.

## 2018-09-01 ENCOUNTER — Ambulatory Visit (INDEPENDENT_AMBULATORY_CARE_PROVIDER_SITE_OTHER): Payer: Self-pay | Admitting: Family Medicine

## 2018-09-01 ENCOUNTER — Ambulatory Visit (INDEPENDENT_AMBULATORY_CARE_PROVIDER_SITE_OTHER): Payer: Self-pay | Admitting: Psychology

## 2018-09-09 ENCOUNTER — Telehealth: Payer: Self-pay | Admitting: Cardiovascular Disease

## 2018-09-09 NOTE — Telephone Encounter (Signed)
Returned call to patient Eliquis 5 mg samples left at [employer] office.

## 2018-09-09 NOTE — Telephone Encounter (Signed)
New Message        Patient calling the office for samples of medication:   1.  What medication and dosage are you requesting samples for? Eliquis  2.  Are you currently out of this medication? 1 week left

## 2018-09-13 DIAGNOSIS — Z23 Encounter for immunization: Secondary | ICD-10-CM | POA: Diagnosis not present

## 2018-09-16 ENCOUNTER — Ambulatory Visit (INDEPENDENT_AMBULATORY_CARE_PROVIDER_SITE_OTHER): Payer: Medicare Other | Admitting: Family Medicine

## 2018-09-16 ENCOUNTER — Ambulatory Visit (INDEPENDENT_AMBULATORY_CARE_PROVIDER_SITE_OTHER): Payer: Medicare Other | Admitting: Psychology

## 2018-09-16 VITALS — BP 102/68 | HR 71 | Ht 67.0 in | Wt 243.0 lb

## 2018-09-16 DIAGNOSIS — Z6838 Body mass index (BMI) 38.0-38.9, adult: Secondary | ICD-10-CM | POA: Diagnosis not present

## 2018-09-16 DIAGNOSIS — F3289 Other specified depressive episodes: Secondary | ICD-10-CM | POA: Diagnosis not present

## 2018-09-16 DIAGNOSIS — F331 Major depressive disorder, recurrent, moderate: Secondary | ICD-10-CM | POA: Diagnosis not present

## 2018-09-16 DIAGNOSIS — R7303 Prediabetes: Secondary | ICD-10-CM | POA: Diagnosis not present

## 2018-09-16 NOTE — Progress Notes (Signed)
Office: [phone]  /  Fax: [phone]   Date: September 16, 2018 Time Seen: 4:08pm Duration: 26 minutes Provider: Glennie Isle, Psy.D. Type of Session: Individual Therapy   HPI: [name] [name] was referred by Dr. Ilene Qua due to depression with emotional eating behaviors and was seen for an initial appointment by this provider on August 18, 2018. Per the note for the visit with Dr. Ilene Qua on August 04, 2018, "[name] [name] struggling with a lot of boredom eating, she is on Zoloft. [name] [name] struggles withemotional eating and using food for comfort to the extent that it is negatively impactingherhealth. Sheoften snacks when sheis not hungry. [name] [name] feels sheis out of control and then feels guilty that shemade poor food choices. [name] [name] been working on behavior modification techniques to help reduce heremotional eating and has been somewhat successful.[name] [name] no sign of suicidal or homicidal ideations." In addition, renda was initially seen at this clinic by Dr. Dennard Nip on September 17, 2017. During that initial appointment, Mame shared that she started gaining weight two and a half years ago and her heaviest weight ever was 274 pounds. [name] [name] reported experiencing the following: significant food craving issues; snacking frequently in the evening; frequently making poor food choices; frequently eating larger portions than normal; binge eating behaviors; and struggling with emotional eating. [name] [name] also disclosed she is a picky eater and does not like to eat healthier foods. During the initial appointment with this provider, [name] [name] shared, "[name] thinks I'm depressed and I'm not really sure that is right." She further explained, "Sometimes, I just eat ferociously. I binge." She described experiencing a "preoccupation with food." [name] [name] shared her last binge was the night before the initial appointment with this provider, which consisted of "five or six fig  bars and some popcorn" followed by "two stuffed peppers and two chocolate popsicles" over the course of four hours. She clarified her binge eating behaviors are usually in the evenings, specifically after dinner. [name] [name] described craving sweets. Moreover, [name] [name] indicated binge eating behaviors starting in childhood. She denied a history of purging and engaging in any other compensatory behaviors. She denied a history of a eating disorder diagnosis. Furthermore, [name] [name] was asked to complete a questionnaire assessing various behaviors related to emotional eating during the initial appointment with this provider. [name] [name] endorsed the following: overeat when you are celebrating, eat certain foods when you are anxious, stressed, depressed, or your feelings are hurt, use food to help you cope with emotional situations, find food is comforting to you, overeat when you are worried about something, overeat frequently when you are bored or lonely and overeat when you are alone, but eat much less when you are with other people.  Session Content: Session focused on the following treatment goal: decrease emotional eating. The session was initiated with the administration of the PHQ-9 and GAD-7, as well as a brief check-in. [name] [name] reported experiencing despair and disappointment in herself. She explained being sick for the past three weeks. [name] [name] reported "eating and eating" due to gastrointestinal issues. She acknowledged using being sick as an excuse to some degree. [name] [name] expressed worry about being "kicked out" of the program. This provider recommended that she discuss this with Dr. Leafy Ro during their appointment today. Moreover, [name] [name] shared, "My daughter passed about a year ago to date." Thus, instead of focusing on hunger patterns and triggers for emotional eating, today's session focused on pleasurable activities as well as the connection between thoughts, feelings, and behaviors. Psychoeducation regarding the  aforementioned was provided and [name] [name]  was given handouts. This provider also explained the impact of pleasurable activities (I.e., behaviors), on emotional eating. Overall, [name] [name] was receptive to today's session as evidenced by her openness to sharing, responsiveness to feedback, and willingness to engage in pleasurable activities.  Mental Status Examination: [name] [name] arrived on time for the appointment; however, the appointment was initiated late due to this provider. She presented as appropriately dressed and groomed. [name] [name] appeared her stated age and demonstrated adequate orientation to time, place, person, and purpose of the appointment. She also demonstrated appropriate eye contact. No psychomotor abnormalities or behavioral peculiarities noted. Her mood was euthymic with congruent affect. Her thought processes were logical, linear, and goal-directed. No hallucinations, delusions, bizarre thinking or behavior reported or observed. Judgment, insight, and impulse control appeared to be grossly intact. There was no evidence of paraphasias (i.e., errors in speech, gross mispronunciations, and word substitutions), repetition deficits, or disturbances in volume or prosody (i.e., rhythm and intonation). There was no evidence of attention or memory impairments. [name] [name] denied current suicidal and homicidal ideation, intent or plan.  Structured Assessment Results: The Patient Health Questionnaire-9 (PHQ-9) is a self-report measure that assesses symptoms and severity of depression over the course of the last two weeks. [name] [name] obtained a score of 12 suggesting moderate depression. [name] [name] finds the endorsed symptoms to be somewhat difficult. Depression screen PHQ 2/9 09/16/2018  Decreased Interest 0  Down, Depressed, Hopeless 2  PHQ - 2 Score 2  Altered sleeping 1  Tired, decreased energy 3  Change in appetite 3  Feeling bad or failure about yourself  3  Trouble concentrating 0  Moving slowly or  fidgety/restless 0  Suicidal thoughts 0  PHQ-9 Score 12  Difficult doing work/chores -  Some recent data might be hidden   The Generalized Anxiety Disorder-7 (GAD-7) is a brief self-report measure that assesses symptoms of anxiety over the course of the last two weeks. [name] [name] obtained a score of three suggesting minimal anxiety. GAD 7 : Generalized Anxiety Score 09/16/2018  Nervous, Anxious, on Edge 1  Control/stop worrying 0  Worry too much - different things 0  Trouble relaxing 2  Restless 0  Easily annoyed or irritable 0  Afraid - awful might happen 0  Total GAD 7 Score 3  Anxiety Difficulty Not difficult at all   Interventions: [name] [name] was administered the PHQ-9 and GAD-7 for symptom monitoring. Throughout today's session, empathic reflections and validation were provided. Psychoeducation regarding the connection between thoughts, feelings, and behaviors as well as pleasurable activities was provided.  DSM-5 Diagnosis: 296.32 (F33.1) Major Depressive Disorder, Recurrent Episode, Moderate, With Anxious Distress  Treatment Goal & Progress: [name] [name] was seen for an initial appointment with this provider on August 18, 2018 during which the following treatment goal was established: decrease emotional eating. Woodlyn has demonstrated progress in her goal of decreasing emotional eating as evidenced by her willingness to engage in pleasurable activities, which can impact her overall emotional eating.  Plan: Emmersyn continues to appear able and willing to participate as evidenced by engagement in reciprocal conversation, and asking questions for clarification as appropriate.The next appointment will be scheduled in two weeks. The next session will focus on reviewing pleasurable activities and discussing triggers for emotional eating.

## 2018-09-21 ENCOUNTER — Encounter: Payer: Self-pay | Admitting: Internal Medicine

## 2018-09-21 ENCOUNTER — Other Ambulatory Visit: Payer: Self-pay | Admitting: Family Medicine

## 2018-09-21 ENCOUNTER — Ambulatory Visit (INDEPENDENT_AMBULATORY_CARE_PROVIDER_SITE_OTHER): Payer: Medicare Other | Admitting: Internal Medicine

## 2018-09-21 ENCOUNTER — Ambulatory Visit (INDEPENDENT_AMBULATORY_CARE_PROVIDER_SITE_OTHER)
Admission: RE | Admit: 2018-09-21 | Discharge: 2018-09-21 | Disposition: A | Discharge status: Home / Self Care | Payer: Medicare Other | Source: Ambulatory Visit | Attending: Internal Medicine | Admitting: Internal Medicine

## 2018-09-21 VITALS — BP 102/64 | HR 76 | Ht 66.0 in | Wt 250.6 lb

## 2018-09-21 DIAGNOSIS — J849 Interstitial pulmonary disease, unspecified: Secondary | ICD-10-CM

## 2018-09-21 DIAGNOSIS — J449 Chronic obstructive pulmonary disease, unspecified: Secondary | ICD-10-CM | POA: Diagnosis not present

## 2018-09-21 DIAGNOSIS — R918 Other nonspecific abnormal finding of lung field: Secondary | ICD-10-CM

## 2018-09-21 DIAGNOSIS — R0609 Other forms of dyspnea: Secondary | ICD-10-CM

## 2018-09-21 DIAGNOSIS — J8410 Pulmonary fibrosis, unspecified: Secondary | ICD-10-CM

## 2018-09-21 MED ORDER — GLYCOPYRROLATE-FORMOTEROL 9-4.8 MCG/ACT IN AERO: 2.0000 | INHALATION_SPRAY | Freq: Two times a day (BID) | RESPIRATORY_TRACT | 0 refills | Status: DC

## 2018-09-21 NOTE — Progress Notes (Signed)
HPI  female former smoker followed for dyspnea on exertion, lung nodules, fibrosis, complicated by A. 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 She does not qualify for portable oxygen CT chest 01/06/17-pulmonary fibrosis, small nodules ANA 1:160 ------------------------------------------------------------------------------------------ 03/19/2018- 77 year old female former smoker followed for dyspnea on exertion, lung nodules, fibrosis, ANA 1:610, complicated by A. 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  Occasional cough.  Left eustachian pressure without hearing loss.  Dyspnea on exertion consistent with her obesity and deconditioning.  No acute event. We reviewed her CT images from last year showing subpleural nodules and minimal interstitial fibrotic change.  09/21/2018- 77 year old female former smoker followed for dyspnea on exertion, lung nodules, fibrosis, ANA 9:604, complicated by A. 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 Feeling Sob with activity today. Previously completed pulmonary rehab. Body weight 250 lbs No acute changes in her breathing.  Notices a full/bloated feeling especially after meals, but little wheeze or cough.  Still followed by cardiology for A. Fib/pacemaker. Gradually adjusting to loss of daughter who died from end-stage lung disease related to her alpha-1 antitrypsin status. CT chest 04/03/2018- IMPRESSION: 1. Pulmonary nodules are stable to slightly decreased back to 01/06/2017 chest CT,  considered benign. 2. 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 No frank honeycombing. No convincing progression since 01/06/2017 high-resolution chest CT study. 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 Usual interstitial pneumonia is unlikely given absence of progression and presence of air trapping. 3. Stable mild patchy air trapping in both lungs indicative of small airways disease. Aortic Atherosclerosis (ICD10-I70.0).  ROS-see HPI   + = positive Constitutional:    weight loss, night sweats, fevers, chills, fatigue, lassitude. HEENT:    headaches, difficulty swallowing, tooth/dental problems, sore throat,       sneezing, itching, ear ache, nasal congestion, post nasal drip, snoring CV:    chest pain, orthopnea, PND, swelling in lower extremities, anasarca,                                                dizziness, palpitations Resp:   +shortness of breath with exertion or at rest.                +productive cough,   non-productive cough, coughing up of blood.              change in color of mucus.  wheezing.   Skin:    rash or lesions. GI:  No-   heartburn, indigestion, abdominal pain, nausea, vomiting, diarrhea,                 change in bowel habits, loss of appetite GU: dysuria, change in color of urine, no urgency or frequency.   flank pain. MS:   joint pain, stiffness, decreased range of motion, back pain. Neuro-     nothing unusual Psych:  change in mood or affect.  depression or anxiety.   memory loss.  OBJ- Physical Exam  General- Alert, Oriented, Affect-appropriate, Distress- none acute, + obese Skin- rash-none, lesions- none, excoriation- none Lymphadenopathy- none Head- atraumatic            Eyes- Gross vision intact, PERRLA, conjunctivae and secretions clear            Ears- Hearing, canals-normal            Nose- Clear, no-Septal dev, mucus, polyps, erosion,  perforation             Throat- Mallampati II , mucosa clear , drainage- none, tonsils- atrophic Neck- flexible , trachea midline, no stridor , thyroid nl, carotid no bruit Chest - symmetrical excursion , unlabored           Heart/CV- RRR , no murmur , no gallop  , no rub, nl s1 s2                           - JVD- none , edema- none, stasis changes- none, varices- none           Lung- + clear, wheeze- none, cough- none , dullness-none, rub- none           Chest wall- +Pacemaker Left Abd- +borborygmi Br/ Gen/ Rectal- Not done, not indicated Extrem- cyanosis- none, clubbing, none, atrophy- none, strength- nl Neuro- grossly intact to observation

## 2018-09-21 NOTE — Progress Notes (Unsigned)
Office: [phone]  /  Fax: [phone]   Date: September 21, 2018 Time Seen:*** Duration:*** Provider: Glennie Isle, Psy.D. Type of Session: Individual Therapy   HPI: [name] referred by Dr. Brent Bulla to depression with emotional eating behaviors and was seen for an initial appointment by this provider on August 18, 2018. Per the note for the visit withDr. Conrad Yoakum August 04, 2018,"[name] struggling with a lot of boredom eating, she is on Zoloft. [name] [name] struggles withemotional eating and using food for comfort to the extent that it is negatively impactingherhealth. Sheoften snacks when sheis not hungry. [name] feels sheis out of control and then feels guilty that shemade poor food choices. [name] [name] been working on behavior modification techniques to help reduce heremotional eating and has been somewhat successful.[name] [name] no sign of suicidal or homicidal ideations." In addition, [name] [name] was initially seen at this clinic by [facility] September 17, 2017.During that initial appointment, [name] [name] shared that she started gaining weight two and a halfyears ago and her heaviest weight ever was 274 pounds. [name] [name] reported experiencing the following: significant food craving issues; snacking frequently in the evening; frequently making poor food choices; frequently eating larger portions than normal; binge eating behaviors; and struggling with emotional eating.[name] disclosed she is a picky eater and does not like to eat healthier foods. During the initial appointment with this provider, [name] [name] shared, "[name] thinks I'm depressed and I'm not really sure that is right." She further explained, "Sometimes, I just eat ferociously. I binge." She described experiencing a "preoccupation with food." Areli shared her last binge was the night before the initial appointment with this provider, which consisted of "five or six fig bars and some  popcorn" followed by "two stuffed peppers and two chocolate popsicles" over the course of four hours. She clarified her binge eating behaviors are usually in the evenings, specifically after dinner. Locklyn described craving sweets.Moreover, [name] indicated binge eating behaviors starting in childhood. She denied a history of purging and engaging in any other compensatory behaviors. She denied a history of a eating disorder diagnosis.Furthermore, [name] asked to complete a questionnaire assessing various behaviors related to emotional eating during the initial appointment with this provider. [name]: overeat when you are celebrating, eat certain foods when you are anxious, stressed, depressed, or your feelings are hurt, use food to help you cope with emotional situations, find food is comforting to you, overeat when you are worried about something, overeat frequently when you are bored or lonely and overeat when you are alone, but eat much less when you are with other people.  Session Content: Session focused on the following treatment goal: decrease emotional eating. The session was initiated with the administration of the PHQ-9 and GAD-7, as well as a brief check-in.   Kynslee was receptive to today's session as evidenced by ***.  Mental Status Examination: [name] arrived on time for the appointment. She presented as appropriately dressed and groomed. [name] appeared her stated age and demonstrated adequate orientation to time, place, person, and purpose of the appointment. She also demonstrated appropriate eye contact. No psychomotor abnormalities or behavioral peculiarities noted. Her mood was {gbmood:21757} with congruent affect. Her thought processes were logical, linear, and goal-directed. No hallucinations, delusions, bizarre thinking or behavior reported or observed. Judgment, insight, and impulse control appeared to be grossly intact. There was no evidence of paraphasias  (i.e., errors in speech, gross mispronunciations, and word substitutions), repetition deficits, or disturbances in volume or prosody (i.e., rhythm and intonation). There was no evidence  of attention or memory impairments. [name] denied current suicidal and homicidal ideation, intent or plan.  Structured Assessment Results: The Patient Health Questionnaire-9 (PHQ-9) is a self-report measure that assesses symptoms and severity of depression over the course of the last two weeks. [name] obtained a score of *** suggesting {GBPHQ9SEVERITY:21752}. [name] finds the endorsed symptoms to be {gbphq9difficulty:21754}.  The Generalized Anxiety Disorder-7 (GAD-7) is a brief self-report measure that assesses symptoms of anxiety over the course of the last two weeks. [name] obtained a score of *** suggesting {gbgad7severity:21753}.  Interventions: [name] was administered the PHQ-9 and GAD-7 for symptom monitoring. Content from the last session was reviewed. Throughout today's session, empathic reflections and validation were provided. Psychoeducation regarding *** was provided and *** [insert other interventions].   DSM-5 Diagnosis: 296.32 (F33.1) Major Depressive Disorder, Recurrent Episode, Moderate, With Anxious Distress  Treatment Goal & Progress: [name] was seen for an initial appointment with this provider on August 18, 2018 during which the following treatment goal was established: decrease emotional eating. [name] has demonstrated progress in her goal of decreasing emotional eating as evidenced by ***  Plan: [name] continues to appear able and willing to participate as evidenced by engagement in reciprocal conversation, and asking questions for clarification as appropriate.*** The next appointment will be scheduled in {gbweeks:21758}. The next session will focus on reviewing learned skills, and working towards the established treatment goal.***

## 2018-09-21 NOTE — Patient Instructions (Addendum)
Sample Bevespi inhaler    Inhale 2 puffs, twice daily    See if the shortness of breath gets any better. If it really helps, then we can call you a prescription.  Order- CXR    Dx COPD mixed type, lung nodules  Order future CT chest High Res, for April, 2020   Dx ILD  Protocol  Please call if we can help

## 2018-09-21 NOTE — Progress Notes (Signed)
Office: [phone]  /  Fax: [phone]   HPI:   Chief Complaint: OBESITY [name] [name] is here to discuss her progress with her obesity treatment plan. She is on the portion control better and make smarter food choices, such as increase vegetables and decrease simple carbohydrates and is following her eating plan approximately 0 % of the time. She states she is on the recumbent elliptical and bike for 20 minutes 2 times per week. [name] [name] has had and upper respiratory infection and has been on prednisone and 2 courses of antibiotics. She is eating because her stomach is painful when empty.  Her weight is 243 lb (110.2 kg) today and has gained 5 pounds since her last visit. She has lost 24 lbs since starting treatment with [place].  Pre-Diabetes [name] [name] has a diagnosis of pre-diabetes based on her elevated Hgb A1c and was informed this puts her at greater risk of developing diabetes. She is not taking metformin currently and continues to work on diet and exercise to decrease risk of diabetes. She denies nausea or hypoglycemia.  Depression with emotional eating behaviors [name] [name] is seeing Dr. Mallie Mussel, our bariatric psychologist. She notes she is very "down" today and feels she is a failure. [name] [name] struggles with emotional eating and using food for comfort to the extent that it is negatively impacting her health. She often snacks when she is not hungry. [name] [name] sometimes feels she is out of control and then feels guilty that she made poor food choices. She has been working on behavior modification techniques to help reduce her emotional eating and has been somewhat successful. She shows no sign of suicidal or homicidal ideations.  Depression screen [facility] 2/9 09/16/2018 08/18/2018 05/07/2018 05/07/2018 01/21/2018  Decreased Interest 0 1 0 0 1  Down, Depressed, Hopeless 2 1 0 0 1  PHQ - 2 Score 2 2 0 0 2  Altered sleeping 1 1 0 0 1  Tired, decreased energy 3 3 0 0 2  Change in appetite 3 3 0 0 0  Feeling bad or  failure about yourself  3 2 0 0 2  Trouble concentrating 0 1 0 0 2  Moving slowly or fidgety/restless 0 0 0 0 2  Suicidal thoughts 0 0 0 0 1  PHQ-9 Score 12 12 0 0 12  Difficult doing work/chores - - - Not difficult at all Somewhat difficult  Some recent data might be hidden    ALLERGIES: Allergies  Allergen Reactions  . Ancef [Cefazolin] Hives and Itching  . Pseudoephedrine Hcl     Other reaction(s): palpitations (moderate)  . Caffeine Palpitations    MEDICATIONS: Current Outpatient Medications on File Prior to Visit  Medication Sig Dispense Refill  . apixaban (ELIQUIS) 5 MG TABS tablet Take 1 tablet (5 mg total) by mouth 2 (two) times daily. 180 tablet 1  . Biotin 5000 MCG CAPS Take 5,000 mcg by mouth daily.     . Calcium Carbonate-Vit D-Min (CALCIUM 1200 PO) Take 2,400 mg by mouth daily.     . cholecalciferol (VITAMIN D) 1000 UNITS tablet Take 1,000 Units by mouth daily.     Marland Kitchen Co-Enzyme Q10 100 MG CAPS Take 100 mg by mouth daily.     . diazepam (VALIUM) 5 MG tablet TAKE 1 TABLET BY MOUTH ONCE DAILY AS NEEDED FOR ANXIETY 30 tablet 1  . diclofenac sodium (VOLTAREN) 1 % GEL Apply 2 g topically 4 (four) times daily as needed. 200 g 3  . fluticasone (FLONASE) 50 MCG/ACT nasal spray  Place 2 sprays into both nostrils daily. 16 g 6  . folic acid (FOLVITE) 213 MCG tablet Take 400 mcg by mouth daily.    Marland Kitchen glucosamine-chondroitin 500-400 MG tablet Take 1 tablet by mouth daily.     Marland Kitchen loratadine (KLS ALLERCLEAR) 10 MG tablet Take 10 mg by mouth daily.    Marland Kitchen MAGNESIUM CITRATE PO Take 250 mg by mouth daily. 1 tab daily    . metoprolol tartrate (LOPRESSOR) 25 MG tablet Take 0.5 tablets (12.5 mg total) by mouth 2 (two) times daily. Patient needs to schedule appointment for refill 90 tablet 3  . nitroGLYCERIN (NITROSTAT) 0.4 MG SL tablet Place 0.4 mg under the tongue every 5 (five) minutes as needed for chest pain.    . pantoprazole (PROTONIX) 40 MG tablet TAKE 1 TABLET BY MOUTH DAILY 90 tablet 1    . Resveratrol 250 MG CAPS Take 250 mg by mouth daily.    . Simethicone (GAS-X PO) Take 2 tablets by mouth daily as needed (bloating).    . Spacer/Aero-Holding Chambers (AEROCHAMBER MV) inhaler Use as instructed 1 each 0  . Triamcinolone Acetonide (CVS NASAL ALLERGY SPRAY NA) Place into the nose.     No current facility-administered medications on file prior to visit.     PAST MEDICAL HISTORY: Past Medical History:  Diagnosis Date  . Alpha-1-antitrypsin deficiency carrier   . Arthritis    "all over"  . Atrial fibrillation ([place])   . Back pain   . Complication of anesthesia    "I woke up during 2 different procedures" (10/04/2014)  . Frequent UTI   . GERD (gastroesophageal reflux disease)   . Hypertension   . MVP (mitral valve prolapse)   . Obesity   . Osteoarthritis   . Presence of permanent cardiac pacemaker   . PVC's (premature ventricular contractions)   . Shortness of breath   . 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 Sleep apnea   . Stroke [facility]) 01/2014   denies deficits on 10/04/2014  . Tachy-brady syndrome ([place]) 10/2014  . TIA (transient ischemic attack)     PAST SURGICAL HISTORY: Past Surgical History:  Procedure Laterality Date  . CARDIOVERSION N/A 09/02/2017   Procedure: CARDIOVERSION;  Surgeon: Sanda Klein, MD;  [facility];  Service: Cardiovascular;  Laterality: N/A;  . EXCISION VAGINAL CYST     benign nodules  . INSERT / REPLACE / REMOVE PACEMAKER  10/04/2014   Medtronic Advisa model [id number] serial number [id number]  . LOOP RECORDER EXPLANT N/A 10/04/2014   Procedure: LOOP RECORDER EXPLANT;  Surgeon: Sanda Klein, MD;  Location: [place] CATH LAB;  Service: Cardiovascular;  Laterality: N/A;  . LOOP RECORDER IMPLANT N/A 04/19/2014   Procedure: LOOP RECORDER IMPLANT;  Surgeon: Sanda Klein, MD;  Location: [place] CATH LAB;  Service: Cardiovascular;  Laterality: N/A;  . NM MYOCAR PERF WALL MOTION  12/18/2007    normal  . PERMANENT PACEMAKER INSERTION N/A 10/04/2014   Procedure: PERMANENT PACEMAKER INSERTION;  Surgeon: Sanda Klein, MD;  Location: [place] CATH LAB;  Service: Cardiovascular;  Laterality: N/A;  . TUBAL LIGATION    . US ECHOCARDIOGRAPHY  05/15/2010   LA mildly dilated,mild mitral annular ca+, AOV mildly sclerotic, mild asymmetric LVH    SOCIAL HISTORY: Social History   Tobacco Use  . Smoking status: Former Smoker    Packs/day: 2.00    Years: 18.00    Pack years: 36.00    Types: Cigarettes  . Smokeless tobacco:  Never Used  . Tobacco comment: "quit smoking in 1971"  Substance Use Topics  . Alcohol use: Yes    Alcohol/week: 0.0 standard drinks    Comment: 10/04/2014 "might have a drink a couple times/yr"  . Drug use: No    FAMILY HISTORY: Family History  Problem Relation Age of Onset  . Heart attack Mother   . Sudden death Mother   . Depression Mother   . Stroke Father   . Heart failure Father   . Depression Father   . Heart failure Brother   . Stroke Brother   . Alpha-1 antitrypsin deficiency Daughter     ROS: Review of Systems  Constitutional: Negative for weight loss.  Gastrointestinal: Negative for nausea.  Endo/Heme/Allergies:       Negative hypoglycemia  Psychiatric/Behavioral: Positive for depression. Negative for suicidal ideas.    PHYSICAL EXAM: Blood pressure 102/68, pulse 71, height _0  (1.702 m), weight 243 lb (110.2 kg), SpO2 96 %. Body mass index is 38.06 kg/m. Physical Exam  Constitutional: She is oriented to person, place, and time. She appears well-developed and well-nourished.  Cardiovascular: Normal rate.  Pulmonary/Chest: Effort normal.  Musculoskeletal: Normal range of motion.  Neurological: She is oriented to person, place, and time.  Skin: Skin is warm and dry.  Psychiatric: She has a normal mood and affect. Her behavior is normal.  Vitals reviewed.   RECENT LABS AND TESTS: BMET    Component Value Date/Time   NA 142 06/03/2018  1022   K 4.5 06/03/2018 1022   CL 104 06/03/2018 1022   CO2 23 06/03/2018 1022   GLUCOSE 110 (H) 06/03/2018 1022   GLUCOSE 92 01/15/2017 1417   BUN 20 06/03/2018 1022   CREATININE 1.06 (H) 06/03/2018 1022   CREATININE 0.86 09/28/2014 1557   CALCIUM 9.3 06/03/2018 1022   GFRNONAA 51 (L) 06/03/2018 1022   GFRNONAA 56 (L) 06/06/2014 1653   GFRAA 59 (L) 06/03/2018 1022   GFRAA 65 06/06/2014 1653   Lab Results  Component Value Date   HGBA1C 5.8 (H) 06/03/2018   HGBA1C 5.8 (H) 12/24/2017   HGBA1C 6.2 (H) 09/17/2017   HGBA1C 5.9 (H) 03/09/2014   Lab Results  Component Value Date   INSULIN 17.6 06/03/2018   INSULIN 19.4 12/24/2017   INSULIN 47.7 (H) 09/17/2017   CBC    Component Value Date/Time   WBC 8.5 09/17/2017 1131   WBC 9.1 02/24/2017 1555   RBC 5.22 09/17/2017 1131   RBC 5.09 02/24/2017 1555   HGB 14.9 09/17/2017 1131   HCT 45.1 09/17/2017 1131   PLT 258 08/27/2017 1509   MCV 86 09/17/2017 1131   MCH 28.5 09/17/2017 1131   MCH 29.0 09/28/2014 1557   MCHC 33.0 09/17/2017 1131   MCHC 33.9 02/24/2017 1555   RDW 13.5 09/17/2017 1131   LYMPHSABS 2.0 09/17/2017 1131   MONOABS 1.3 (H) 02/24/2017 1555   EOSABS 0.2 09/17/2017 1131   BASOSABS 0.0 09/17/2017 1131   Iron/TIBC/Ferritin/ %Sat No results found for: IRON, TIBC, FERRITIN, IRONPCTSAT Lipid Panel     Component Value Date/Time   CHOL 176 06/03/2018 1022   TRIG 113 06/03/2018 1022   HDL 53 06/03/2018 1022   CHOLHDL 3 01/15/2017 1417   VLDL 33.6 01/15/2017 1417   LDLCALC 100 (H) 06/03/2018 1022   LDLDIRECT 81.0 07/15/2016 1419   Hepatic Function Panel     Component Value Date/Time   PROT 6.8 06/03/2018 1022   ALBUMIN 3.9 06/03/2018 1022   AST 24  06/03/2018 1022   ALT 16 06/03/2018 1022   ALKPHOS 84 06/03/2018 1022   BILITOT 0.3 06/03/2018 1022   BILIDIR 0.1 01/15/2017 1417      Component Value Date/Time   TSH 2.040 09/17/2017 1131   TSH 2.70 07/15/2016 1419   TSH 1.529 06/06/2014 1608     ASSESSMENT AND PLAN: Prediabetes  Other depression - with emotional eating  Class 2 severe obesity with serious comorbidity and body mass index (BMI) of 38.0 to 38.9 in adult, unspecified obesity type ([place])  PLAN:  Pre-Diabetes [name] [name] will continue to work on weight loss, exercise, and decreasing simple carbohydrates in her diet to help decrease the risk of diabetes. We dicussed metformin including benefits and risks. She was informed that eating too many simple carbohydrates or too many calories at one sitting increases the likelihood of GI side effects. Teesha declined metformin for now and a prescription was not written today. We will recheck labs at next visit. [name] [name] agrees to follow up with our clinic in 2 weeks as directed to monitor her progress.  Depression with Emotional Eating Behaviors We discussed behavior modification techniques today to help [name] [name] deal with her emotional eating and depression. Encouragement was provided, and [name] [name] will follow up with Dr. Mallie Mussel, our bariatric psychologist in 2 weeks. Also encouraged self care.  Obesity [name] [name] is currently in the action stage of change. As such, her goal is to continue with weight loss efforts She has agreed to portion control better and make smarter food choices, such as increase vegetables and decrease simple carbohydrates  [name] [name] has been instructed to work up to a goal of 150 minutes of combined cardio and strengthening exercise per week or continue as is for weight loss and overall health benefits. We discussed the following Behavioral Modification Strategies today: increasing lean protein intake, no skipping meals, and planning for success [name] [name] was advised to eat as needed to avoid stomach upset.  [name] [name] has agreed to follow up with our clinic in 2 weeks. She was informed of the importance of frequent follow up visits to maximize her success with intensive lifestyle modifications for her multiple health  conditions.   OBESITY BEHAVIORAL INTERVENTION VISIT  Today's visit was # 20   Starting weight: 267 lbs Starting date: 09/17/17 Today's weight : 243 lbs Today's date: 09/16/2018 Total lbs lost to date: 24 At least 15 minutes were spent on discussing the following behavioral intervention visit.   ASK: We discussed the diagnosis of obesity with [name] [name] today and [name] [name] agreed to give [place] permission to discuss obesity behavioral modification therapy today.  ASSESS: [name] [name] has the diagnosis of obesity and her BMI today is 38.05 [name] [name] is in the action stage of change   ADVISE: [name] [name] was educated on the multiple health risks of obesity as well as the benefit of weight loss to improve her health. She was advised of the need for long term treatment and the importance of lifestyle modifications to improve her current health and to decrease her risk of future health problems.  AGREE: Multiple dietary modification options and treatment options were discussed and  [name] [name] agreed to follow the recommendations documented in the above note.  ARRANGE: [name] [name] was educated on the importance of frequent visits to treat obesity as outlined per CMS and USPSTF guidelines and agreed to schedule her next follow up appointment today.  I, Trixie Dredge, am acting as transcriptionist for Dennard Nip, MD  I have reviewed the above documentation for accuracy and completeness,  and I agree with the above. -Dennard Nip, MD

## 2018-09-22 ENCOUNTER — Encounter (INDEPENDENT_AMBULATORY_CARE_PROVIDER_SITE_OTHER): Payer: Self-pay | Admitting: Family Medicine

## 2018-09-22 ENCOUNTER — Ambulatory Visit (INDEPENDENT_AMBULATORY_CARE_PROVIDER_SITE_OTHER): Payer: Medicare Other | Admitting: Psychology

## 2018-09-22 ENCOUNTER — Encounter: Payer: Medicare Other | Admitting: Family Medicine

## 2018-09-22 ENCOUNTER — Ambulatory Visit (INDEPENDENT_AMBULATORY_CARE_PROVIDER_SITE_OTHER): Payer: Medicare Other | Admitting: Family Medicine

## 2018-09-22 ENCOUNTER — Encounter (INDEPENDENT_AMBULATORY_CARE_PROVIDER_SITE_OTHER): Payer: Self-pay

## 2018-09-22 VITALS — BP 125/80 | HR 77 | Temp 98.5°F | Ht 66.0 in | Wt 242.0 lb

## 2018-09-22 DIAGNOSIS — Z6839 Body mass index (BMI) 39.0-39.9, adult: Secondary | ICD-10-CM

## 2018-09-22 DIAGNOSIS — F3289 Other specified depressive episodes: Secondary | ICD-10-CM | POA: Diagnosis not present

## 2018-09-22 DIAGNOSIS — R7303 Prediabetes: Secondary | ICD-10-CM

## 2018-09-22 DIAGNOSIS — R5383 Other fatigue: Secondary | ICD-10-CM

## 2018-09-22 DIAGNOSIS — Z0289 Encounter for other administrative examinations: Secondary | ICD-10-CM

## 2018-09-22 NOTE — Progress Notes (Deleted)
[name] [name] is a 77 y.o. female is here to [place].   Patient Care Team: Briscoe Deutscher, DO as PCP - General (Family Medicine) Maisie Fus, MD as Consulting Physician (Obstetrics and Gynecology) Richmond Campbell, MD as Consulting Physician (Gastroenterology) Croitoru, Dani Gobble, MD as Consulting Physician (Cardiology) Kathie Rhodes, MD as Consulting Physician (Urology) Deneise Lever, MD as Consulting Physician (Pulmonary Disease) Danella Sensing, MD as Consulting Physician (Dermatology) Starlyn Skeans, MD as Consulting Physician (Family Medicine)   History of Present Illness:   {CMA SCRIBE ATTESTATION}  HPI:  Health Maintenance Due  Topic Date Due  . MAMMOGRAM  03/22/2018   Depression screen PHQ 2/9 09/16/2018  Decreased Interest 0  Down, Depressed, Hopeless 2  PHQ - 2 Score 2  Altered sleeping 1  Tired, decreased energy 3  Change in appetite 3  Feeling bad or failure about yourself  3  Trouble concentrating 0  Moving slowly or fidgety/restless 0  Suicidal thoughts 0  PHQ-9 Score 12  Difficult doing work/chores -  Some recent data might be hidden    PMHx, SurgHx, SocialHx, Medications, and Allergies were reviewed in the Visit Navigator and updated as appropriate.   Past Medical History:  Diagnosis Date  . Alpha-1-antitrypsin deficiency carrier   . Arthritis    "all over"  . Atrial fibrillation ([place])   . Back pain   . Complication of anesthesia    "I woke up during 2 different procedures" (10/04/2014)  . Frequent UTI   . GERD (gastroesophageal reflux disease)   . Hypertension   . MVP (mitral valve prolapse)   . Obesity   . Osteoarthritis   . Presence of permanent cardiac pacemaker   . PVC's (premature ventricular contractions)   . Shortness of breath   . 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 Sleep apnea   . Stroke [facility]) 01/2014   denies deficits on 10/04/2014  . Tachy-brady syndrome ([place]) 10/2014    . TIA (transient ischemic attack)     Past Surgical History:  Procedure Laterality Date  . CARDIOVERSION N/A 09/02/2017   Procedure: CARDIOVERSION;  Surgeon: Sanda Klein, MD;  [facility];  Service: Cardiovascular;  Laterality: N/A;  . EXCISION VAGINAL CYST     benign nodules  . INSERT / REPLACE / REMOVE PACEMAKER  10/04/2014   Medtronic Advisa model [id number] serial number [id number]  . LOOP RECORDER EXPLANT N/A 10/04/2014   Procedure: LOOP RECORDER EXPLANT;  Surgeon: Sanda Klein, MD;  Location: [place] CATH LAB;  Service: Cardiovascular;  Laterality: N/A;  . LOOP RECORDER IMPLANT N/A 04/19/2014   Procedure: LOOP RECORDER IMPLANT;  Surgeon: Sanda Klein, MD;  Location: [place] CATH LAB;  Service: Cardiovascular;  Laterality: N/A;  . NM MYOCAR PERF WALL MOTION  12/18/2007   normal  . PERMANENT PACEMAKER INSERTION N/A 10/04/2014   Procedure: PERMANENT PACEMAKER INSERTION;  Surgeon: Sanda Klein, MD;  Location: [place] CATH LAB;  Service: Cardiovascular;  Laterality: N/A;  . TUBAL LIGATION    . US ECHOCARDIOGRAPHY  05/15/2010   LA mildly dilated,mild mitral annular ca+, AOV mildly sclerotic, mild asymmetric LVH    Family History  Problem Relation Age of Onset  . Heart attack Mother   . Sudden death Mother   . Depression Mother   . Stroke Father   . Heart failure Father   . Depression Father   . Heart failure Brother   . Stroke Brother   . Alpha-1 antitrypsin deficiency  Daughter     Social History   Tobacco Use  . Smoking status: Former Smoker    Packs/day: 2.00    Years: 18.00    Pack years: 36.00    Types: Cigarettes  . Smokeless tobacco: Never Used  . Tobacco comment: "quit smoking in 1971"  Substance Use Topics  . Alcohol use: Yes    Alcohol/week: 0.0 standard drinks    Comment: 10/04/2014 "might have a drink a couple times/yr"  . Drug use: No    Current Medications and Allergies:   Current Outpatient Medications:  .  apixaban (ELIQUIS) 5 MG TABS tablet, Take 1  tablet (5 mg total) by mouth 2 (two) times daily., Disp: 180 tablet, Rfl: 1 .  Biotin 5000 MCG CAPS, Take 5,000 mcg by mouth daily. , Disp: , Rfl:  .  Calcium Carbonate-Vit D-Min (CALCIUM 1200 PO), Take 2,400 mg by mouth daily. , Disp: , Rfl:  .  cholecalciferol (VITAMIN D) 1000 UNITS tablet, Take 1,000 Units by mouth daily. , Disp: , Rfl:  .  Co-Enzyme Q10 100 MG CAPS, Take 100 mg by mouth daily. , Disp: , Rfl:  .  diazepam (VALIUM) 5 MG tablet, TAKE 1 TABLET BY MOUTH ONCE DAILY AS NEEDED FOR ANXIETY, Disp: 30 tablet, Rfl: 1 .  diclofenac sodium (VOLTAREN) 1 % GEL, Apply 2 g topically 4 (four) times daily as needed., Disp: 200 g, Rfl: 3 .  fluticasone (FLONASE) 50 MCG/ACT nasal spray, Place 2 sprays into both nostrils daily., Disp: 16 g, Rfl: 6 .  folic acid (FOLVITE) 147 MCG tablet, Take 400 mcg by mouth daily., Disp: , Rfl:  .  glucosamine-chondroitin 500-400 MG tablet, Take 1 tablet by mouth daily. , Disp: , Rfl:  .  Glycopyrrolate-Formoterol (BEVESPI AEROSPHERE) 9-4.8 MCG/ACT AERO, Inhale 2 puffs into the lungs 2 (two) times daily., Disp: 1 Inhaler, Rfl: 0 .  loratadine (KLS ALLERCLEAR) 10 MG tablet, Take 10 mg by mouth daily., Disp: , Rfl:  .  MAGNESIUM CITRATE PO, Take 250 mg by mouth daily. 1 tab daily, Disp: , Rfl:  .  metoprolol tartrate (LOPRESSOR) 25 MG tablet, Take 0.5 tablets (12.5 mg total) by mouth 2 (two) times daily. Patient needs to schedule appointment for refill, Disp: 90 tablet, Rfl: 3 .  nitroGLYCERIN (NITROSTAT) 0.4 MG SL tablet, Place 0.4 mg under the tongue every 5 (five) minutes as needed for chest pain., Disp: , Rfl:  .  pantoprazole (PROTONIX) 40 MG tablet, TAKE 1 TABLET BY MOUTH DAILY, Disp: 90 tablet, Rfl: 1 .  Resveratrol 250 MG CAPS, Take 250 mg by mouth daily., Disp: , Rfl:  .  sertraline (ZOLOFT) 100 MG tablet, TAKE 1 TABLET BY MOUTH ONCE DAILY, Disp: 90 tablet, Rfl: 0 .  Simethicone (GAS-X PO), Take 2 tablets by mouth daily as needed (bloating)., Disp: , Rfl:  .   Spacer/Aero-Holding Chambers (AEROCHAMBER MV) inhaler, Use as instructed, Disp: 1 each, Rfl: 0 .  Triamcinolone Acetonide (CVS NASAL ALLERGY SPRAY NA), Place into the nose., Disp: , Rfl:   Allergies  Allergen Reactions  . Ancef [Cefazolin] Hives and Itching  . Pseudoephedrine Hcl     Other reaction(s): palpitations (moderate)  . Caffeine Palpitations   Review of Systems:   Pertinent items are noted in the HPI. Otherwise, ROS is negative.  Vitals:  There were no vitals filed for this visit.   There is no height or weight on file to calculate BMI.  Physical Exam:   Physical Exam  Results for  orders placed or performed in visit on 07/06/18  CUP PACEART REMOTE DEVICE CHECK  Result Value Ref Range   Date Time Interrogation Session [phone]    Pulse Generator Manufacturer MERM    Pulse Gen Model [id number] Advisa DR MRI    Pulse Gen Serial Number [id number] [facility]    Implantable Pulse Generator Type Implantable Pulse Generator    Implantable Pulse Generator Implant Date 94585929    Implantable Lead Manufacturer MERM    Implantable Lead Model 5076 CapSureFix Novus    Implantable Lead Serial Number [id number]    Implantable Lead Implant Date 24462863    Implantable Lead Location Detail 1 APPENDAGE    Implantable Lead Location [id number]    Implantable Lead Manufacturer MERM    Implantable Lead Model 5076 CapSureFix Novus    Implantable Lead Serial Number [id number]    Implantable Lead Implant Date [id number]    Implantable Lead Location Detail 1 APEX    Implantable Lead Location [id number]    Lead Channel Setting Sensing Sensitivity 2.8 mV   Lead Channel Setting Pacing Amplitude 1.5 V   Lead Channel Setting Pacing Pulse Width 0.4 ms   Lead Channel Setting Pacing Amplitude 2 V   Lead Channel Impedance Value 456 ohm   Lead Channel Impedance Value 418 ohm   Lead Channel Sensing Intrinsic Amplitude 5.5 mV   Lead Channel Sensing Intrinsic Amplitude 5.5 mV    Lead Channel Pacing Threshold Amplitude 0.5 V   Lead Channel Pacing Threshold Pulse Width 0.4 ms   Lead Channel Impedance Value 589 ohm   Lead Channel Impedance Value 551 ohm   Lead Channel Sensing Intrinsic Amplitude 7.875 mV   Lead Channel Sensing Intrinsic Amplitude 7.875 mV   Lead Channel Pacing Threshold Amplitude 0.5 V   Lead Channel Pacing Threshold Pulse Width 0.4 ms   Battery Status OK    Battery Remaining Longevity 84 mo   Battery Voltage 3.01 V   Brady Statistic RA Percent Paced 92.82 %   Brady Statistic RV Percent Paced 0.47 %   Brady Statistic AP VP Percent 0.04 %   Brady Statistic AS VP Percent 0.41 %   Brady Statistic AP VS Percent 93.80 %   Brady Statistic AS VS Percent 5.75 %   Eval Rhythm ApVs     Assessment and Plan:   There are no diagnoses linked to this encounter.  . Reviewed expectations re: course of current medical issues. . Discussed self-management of symptoms. . Outlined signs and symptoms indicating need for more acute intervention. . Patient verbalized understanding and all questions were answered. Marland Kitchen Health Maintenance issues including appropriate healthy diet, exercise, and smoking avoidance were discussed with patient. . See orders for this visit as documented in the electronic medical record. . Patient received an After Visit Summary.  *** CMA served as Education administrator during this visit. History, Physical, and Plan performed by medical provider. The above documentation has been reviewed and is accurate and complete. Briscoe Deutscher, D.O.   Briscoe Deutscher, DO [place], [facility] 09/22/2018

## 2018-09-22 NOTE — Progress Notes (Addendum)
Office: [phone]  /  Fax: [phone]   HPI:   Chief Complaint: OBESITY [name] [name] is here to discuss her progress with her obesity treatment plan. She is using portion control better and make smarter food choices, such as increase vegetables and decrease simple carbohydrates  and is following her eating plan approximately 20% of the time. She states she is walking for 10 minutes 2 times per week. [name] [name] is currently struggling with too many sweets in her house. Her brother currently lives with her and keeps junk food in the house. He will be moving out within the next week. [name] [name] also reports she has applied to move into assisted living which she feels will be a positive move for her for health and social reasons as meals are provided in the dining room.  Her weight is 242 lb (109.8 kg) today and has not lost weight since her last visit. She has lost 25 lbs since starting treatment with [place].  Fatigue [name] [name] feels her energy is lower than it should be. This has worsened with weight gain and has worsened recently since she has been ill with an upper respiratory infection. She also reports shortness of breath which is a chronic problem for her.   Pre-Diabetes [name] [name] has a diagnosis of prediabetes based on her elevated HgA1c of 5.8 on June 03, 2018 and was informed this puts her at greater risk of developing diabetes. She is not taking metformin currently and continues to work on diet and exercise to decrease risk of diabetes. She denies nausea or hypoglycemia.  Depression with emotional eating behaviors [name] [name] is struggling with emotional eating and using food for comfort to the extent that it is negatively impacting her health. She often snacks when she is not hungry. [name] [name] sometimes feels she is out of control and then feels guilty that she made poor food choices. She has been working on behavior modification techniques to help reduce her emotional eating and has been unsuccessful. She shows no  sign of suicidal or homicidal ideations.   Depression screen [facility] 2/9 09/16/2018 08/18/2018 05/07/2018 05/07/2018 01/21/2018  Decreased Interest 0 1 0 0 1  Down, Depressed, Hopeless 2 1 0 0 1  PHQ - 2 Score 2 2 0 0 2  Altered sleeping 1 1 0 0 1  Tired, decreased energy 3 3 0 0 2  Change in appetite 3 3 0 0 0  Feeling bad or failure about yourself  3 2 0 0 2  Trouble concentrating 0 1 0 0 2  Moving slowly or fidgety/restless 0 0 0 0 2  Suicidal thoughts 0 0 0 0 1  PHQ-9 Score 12 12 0 0 12  Difficult doing work/chores - - - Not difficult at all Somewhat difficult  Some recent data might be hidden     ALLERGIES: Allergies  Allergen Reactions  . Ancef [Cefazolin] Hives and Itching  . Pseudoephedrine Hcl     Other reaction(s): palpitations (moderate)  . Caffeine Palpitations    MEDICATIONS: Current Outpatient Medications on File Prior to Visit  Medication Sig Dispense Refill  . apixaban (ELIQUIS) 5 MG TABS tablet Take 1 tablet (5 mg total) by mouth 2 (two) times daily. 180 tablet 1  . Biotin 5000 MCG CAPS Take 5,000 mcg by mouth daily.     . Calcium Carbonate-Vit D-Min (CALCIUM 1200 PO) Take 2,400 mg by mouth daily.     . cholecalciferol (VITAMIN D) 1000 UNITS tablet Take 1,000 Units by mouth daily.     Marland Kitchen  Co-Enzyme Q10 100 MG CAPS Take 100 mg by mouth daily.     . diazepam (VALIUM) 5 MG tablet TAKE 1 TABLET BY MOUTH ONCE DAILY AS NEEDED FOR ANXIETY 30 tablet 1  . diclofenac sodium (VOLTAREN) 1 % GEL Apply 2 g topically 4 (four) times daily as needed. 200 g 3  . fluticasone (FLONASE) 50 MCG/ACT nasal spray Place 2 sprays into both nostrils daily. 16 g 6  . folic acid (FOLVITE) 024 MCG tablet Take 400 mcg by mouth daily.    Marland Kitchen glucosamine-chondroitin 500-400 MG tablet Take 1 tablet by mouth daily.     . Glycopyrrolate-Formoterol (BEVESPI AEROSPHERE) 9-4.8 MCG/ACT AERO Inhale 2 puffs into the lungs 2 (two) times daily. 1 Inhaler 0  . loratadine (KLS ALLERCLEAR) 10 MG tablet Take 10 mg by  mouth daily.    Marland Kitchen MAGNESIUM CITRATE PO Take 250 mg by mouth daily. 1 tab daily    . metoprolol tartrate (LOPRESSOR) 25 MG tablet Take 0.5 tablets (12.5 mg total) by mouth 2 (two) times daily. Patient needs to schedule appointment for refill 90 tablet 3  . nitroGLYCERIN (NITROSTAT) 0.4 MG SL tablet Place 0.4 mg under the tongue every 5 (five) minutes as needed for chest pain.    . pantoprazole (PROTONIX) 40 MG tablet TAKE 1 TABLET BY MOUTH DAILY 90 tablet 1  . Resveratrol 250 MG CAPS Take 250 mg by mouth daily.    . sertraline (ZOLOFT) 100 MG tablet TAKE 1 TABLET BY MOUTH ONCE DAILY 90 tablet 0  . Simethicone (GAS-X PO) Take 2 tablets by mouth daily as needed (bloating).    . Spacer/Aero-Holding Chambers (AEROCHAMBER MV) inhaler Use as instructed 1 each 0  . Triamcinolone Acetonide ([employer] NASAL ALLERGY SPRAY NA) Place into the nose.     No current facility-administered medications on file prior to visit.     PAST MEDICAL HISTORY: Past Medical History:  Diagnosis Date  . Alpha-1-antitrypsin deficiency carrier   . Arthritis    "all over"  . Atrial fibrillation ([place])   . Back pain   . Complication of anesthesia    "I woke up during 2 different procedures" (10/04/2014)  . Frequent UTI   . GERD (gastroesophageal reflux disease)   . Hypertension   . MVP (mitral valve prolapse)   . Obesity   . Osteoarthritis   . Presence of permanent cardiac pacemaker   . PVC's (premature ventricular contractions)   . Shortness of breath   . 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 Sleep apnea   . Stroke [facility]) 01/2014   denies deficits on 10/04/2014  . Tachy-brady syndrome ([place]) 10/2014  . TIA (transient ischemic attack)     PAST SURGICAL HISTORY: Past Surgical History:  Procedure Laterality Date  . CARDIOVERSION N/A 09/02/2017   Procedure: CARDIOVERSION;  Surgeon: Sanda Klein, MD;  Location: [facility] ENDOSCOPY;  Service: Cardiovascular;  Laterality: N/A;  .  EXCISION VAGINAL CYST     benign nodules  . INSERT / REPLACE / REMOVE PACEMAKER  10/04/2014   Medtronic Advisa model [id number] serial number [id number]  . LOOP RECORDER EXPLANT N/A 10/04/2014   Procedure: LOOP RECORDER EXPLANT;  Surgeon: Sanda Klein, MD;  Location: [place] CATH LAB;  Service: Cardiovascular;  Laterality: N/A;  . LOOP RECORDER IMPLANT N/A 04/19/2014   Procedure: LOOP RECORDER IMPLANT;  Surgeon: Sanda Klein, MD;  Location: [place] CATH LAB;  Service: Cardiovascular;  Laterality: N/A;  . NM [place]  12/18/2007   normal  . PERMANENT PACEMAKER INSERTION N/A 10/04/2014   Procedure: PERMANENT PACEMAKER INSERTION;  Surgeon: Sanda Klein, MD;  Location: [place] CATH LAB;  Service: Cardiovascular;  Laterality: N/A;  . TUBAL LIGATION    . US ECHOCARDIOGRAPHY  05/15/2010   LA mildly dilated,mild mitral annular ca+, AOV mildly sclerotic, mild asymmetric LVH    SOCIAL HISTORY: Social History   Tobacco Use  . Smoking status: Former Smoker    Packs/day: 2.00    Years: 18.00    Pack years: 36.00    Types: Cigarettes  . Smokeless tobacco: Never Used  . Tobacco comment: "quit smoking in 1971"  Substance Use Topics  . Alcohol use: Yes    Alcohol/week: 0.0 standard drinks    Comment: 10/04/2014 "might have a drink a couple times/yr"  . Drug use: No    FAMILY HISTORY: Family History  Problem Relation Age of Onset  . Heart attack Mother   . Sudden death Mother   . Depression Mother   . Stroke Father   . Heart failure Father   . Depression Father   . Heart failure Brother   . Stroke Brother   . Alpha-1 antitrypsin deficiency Daughter     ROS: Review of Systems  Constitutional: Positive for malaise/fatigue. Negative for weight loss.  HENT: Positive for congestion.   Respiratory: Positive for shortness of breath.   Gastrointestinal: Negative for nausea.  Endo/Heme/Allergies:       Negative for hypoglycemia.    PHYSICAL EXAM: Blood pressure 125/80, pulse 77,  temperature 98.5 F (36.9 C), temperature source Oral, height _0  (1.676 m), weight 242 lb (109.8 kg), SpO2 96 %. Body mass index is 39.06 kg/m. Physical Exam  Constitutional: She is oriented to person, place, and time. She appears well-developed and well-nourished.  Cardiovascular: Normal rate.  Pulmonary/Chest: Effort normal.  Musculoskeletal: Normal range of motion.  Neurological: She is alert and oriented to person, place, and time.  Skin: Skin is warm and dry.  Psychiatric: She has a normal mood and affect. Her behavior is normal.  Vitals reviewed.   RECENT LABS AND TESTS: BMET    Component Value Date/Time   NA 142 06/03/2018 1022   K 4.5 06/03/2018 1022   CL 104 06/03/2018 1022   CO2 23 06/03/2018 1022   GLUCOSE 110 (H) 06/03/2018 1022   GLUCOSE 92 01/15/2017 1417   BUN 20 06/03/2018 1022   CREATININE 1.06 (H) 06/03/2018 1022   CREATININE 0.86 09/28/2014 1557   CALCIUM 9.3 06/03/2018 1022   GFRNONAA 51 (L) 06/03/2018 1022   GFRNONAA 56 (L) 06/06/2014 1653   GFRAA 59 (L) 06/03/2018 1022   GFRAA 65 06/06/2014 1653   Lab Results  Component Value Date   HGBA1C 5.8 (H) 06/03/2018   HGBA1C 5.8 (H) 12/24/2017   HGBA1C 6.2 (H) 09/17/2017   HGBA1C 5.9 (H) 03/09/2014   Lab Results  Component Value Date   INSULIN 17.6 06/03/2018   INSULIN 19.4 12/24/2017   INSULIN 47.7 (H) 09/17/2017   CBC    Component Value Date/Time   WBC 8.5 09/17/2017 1131   WBC 9.1 02/24/2017 1555   RBC 5.22 09/17/2017 1131   RBC 5.09 02/24/2017 1555   HGB 14.9 09/17/2017 1131   HCT 45.1 09/17/2017 1131   PLT 258 08/27/2017 1509   MCV 86 09/17/2017 1131   MCH 28.5 09/17/2017 1131   MCH 29.0 09/28/2014 1557   MCHC 33.0 09/17/2017 1131   MCHC 33.9 02/24/2017 1555   RDW  13.5 09/17/2017 1131   LYMPHSABS 2.0 09/17/2017 1131   MONOABS 1.3 (H) 02/24/2017 1555   EOSABS 0.2 09/17/2017 1131   BASOSABS 0.0 09/17/2017 1131   Iron/TIBC/Ferritin/ %Sat No results found for: IRON, TIBC,  FERRITIN, IRONPCTSAT Lipid Panel     Component Value Date/Time   CHOL 176 06/03/2018 1022   TRIG 113 06/03/2018 1022   HDL 53 06/03/2018 1022   CHOLHDL 3 01/15/2017 1417   VLDL 33.6 01/15/2017 1417   LDLCALC 100 (H) 06/03/2018 1022   LDLDIRECT 81.0 07/15/2016 1419   Hepatic Function Panel     Component Value Date/Time   PROT 6.8 06/03/2018 1022   ALBUMIN 3.9 06/03/2018 1022   AST 24 06/03/2018 1022   ALT 16 06/03/2018 1022   ALKPHOS 84 06/03/2018 1022   BILITOT 0.3 06/03/2018 1022   BILIDIR 0.1 01/15/2017 1417      Component Value Date/Time   TSH 2.040 09/17/2017 1131   TSH 2.70 07/15/2016 1419   TSH 1.529 06/06/2014 1608  Results for NAVAH, GRONDIN (MRN [id number]) as of 09/22/2018 11:51  Ref. Range 06/03/2018 10:22  Vitamin D, 25-Hydroxy Latest Ref Range: 30.0 - 100.0 ng/mL 53.1    ASSESSMENT AND PLAN: Other fatigue - Plan: T3, T4, free, TSH  Prediabetes - Plan: Comprehensive metabolic panel, Hemoglobin A1c, Insulin, random  Other depression - with emotional eating  Class 2 severe obesity with serious comorbidity and body mass index (BMI) of 39.0 to 39.9 in adult, unspecified obesity type (HCC)  PLAN:  Fatigue [name] [name] was informed that her fatigue is likely related to recent upper respiratory illness.  Thyroid and electrolyte panel will be ordered, and in the meanwhile [name] [name] has agreed to work on diet, exercise and weight loss to help with fatigue.  Pre-Diabetes [name] [name] will continue to work on weight loss, exercise, and decreasing simple carbohydrates in her diet to help decrease the risk of diabetes.  [name] [name] agreed to follow up with [place] as directed to monitor her progress. We will recheck HgbA1c and fasting insulin levels today.  Depression with Emotional Eating Behaviors We discussed behavior modification techniques today to help [name] [name] deal with her emotional eating and depression. She is meeting with [name] [name] today to discuss depression/emotional  eating.  Obesity [name] [name] is currently in the action stage of change. As such, her goal is to continue with weight loss efforts She has agreed to portion control better and make smarter food choices, such as increase vegetables and decrease simple carbohydrates  [name] [name] has been instructed to contineu walking walking for 10 minutes 2 times per week. We discussed the following Behavioral Modification Strategies today: increasing lean protein intake, better snacking choices, removing sweets from home, and emotional eating strategies.   [name] [name] has agreed to follow up with our clinic in 2 weeks. She was informed of the importance of frequent follow up visits to maximize her success with intensive lifestyle modifications for her multiple health conditions.   OBESITY BEHAVIORAL INTERVENTION VISIT  Today's visit was # 20  Starting weight: 267 Starting date: 09/17/17 Today's weight : Weight: 242 lb (109.8 kg)  Today's date: 09/22/2018 Total lbs lost to date: 25 At least 15 minutes were spent on discussing the following behavioral intervention visit.   ASK: We discussed the diagnosis of obesity with [name] [name] today and [name] [name] agreed to give [place] permission to discuss obesity behavioral modification therapy today.  ASSESS: [name] [name] has the diagnosis of obesity and her BMI today is 39.08 Lakendra is in the action  stage of change   ADVISE: [place] was educated on the multiple health risks of obesity as well as the benefit of weight loss to improve her health. She was advised of the need for long term treatment and the importance of lifestyle modifications to improve her current health and to decrease her risk of future health problems.  AGREE: Multiple dietary modification options and treatment options were discussed and  Reanna agreed to follow the recommendations documented in the above note.  ARRANGE: [place] was educated on the importance of frequent visits to treat obesity as outlined per CMS  and USPSTF guidelines and agreed to schedule her next follow up appointment today.

## 2018-09-23 LAB — T4, FREE: Free T4: 0.98 ng/dL (ref 0.82–1.77)

## 2018-09-23 LAB — HEMOGLOBIN A1C
Est. average glucose Bld gHb Est-mCnc: 120 mg/dL
Hgb A1c MFr Bld: 5.8 % — ABNORMAL HIGH (ref 4.8–5.6)

## 2018-09-23 LAB — COMPREHENSIVE METABOLIC PANEL
ALT: 17 IU/L (ref 0–32)
AST: 24 IU/L (ref 0–40)
Albumin/Globulin Ratio: 1.6 (ref 1.2–2.2)
Albumin: 4 g/dL (ref 3.5–4.8)
Alkaline Phosphatase: 74 IU/L (ref 39–117)
BUN/Creatinine Ratio: 19 (ref 12–28)
BUN: 19 mg/dL (ref 8–27)
Bilirubin Total: 0.4 mg/dL (ref 0.0–1.2)
CO2: 25 mmol/L (ref 20–29)
Calcium: 9.2 mg/dL (ref 8.7–10.3)
Chloride: 102 mmol/L (ref 96–106)
Creatinine, Ser: 0.99 mg/dL (ref 0.57–1.00)
GFR calc Af Amer: 64 mL/min/{1.73_m2} (ref >59)
GFR calc non Af Amer: 55 mL/min/{1.73_m2} — ABNORMAL LOW (ref >59)
Globulin, Total: 2.5 g/dL (ref 1.5–4.5)
Glucose: 114 mg/dL — ABNORMAL HIGH (ref 65–99)
Potassium: 4.5 mmol/L (ref 3.5–5.2)
Sodium: 142 mmol/L (ref 134–144)
Total Protein: 6.5 g/dL (ref 6.0–8.5)

## 2018-09-23 LAB — INSULIN, RANDOM: INSULIN: 24.2 u[IU]/mL (ref 2.6–24.9)

## 2018-09-23 LAB — T3: T3, Total: 133 ng/dL (ref 71–180)

## 2018-09-23 LAB — TSH: TSH: 2.62 u[IU]/mL (ref 0.450–4.500)

## 2018-09-24 ENCOUNTER — Ambulatory Visit (INDEPENDENT_AMBULATORY_CARE_PROVIDER_SITE_OTHER): Payer: Medicare Other | Admitting: Cardiovascular Disease

## 2018-09-24 ENCOUNTER — Encounter: Payer: Self-pay | Admitting: Cardiovascular Disease

## 2018-09-24 VITALS — BP 118/72 | HR 86 | Ht 66.0 in | Wt 248.0 lb

## 2018-09-24 DIAGNOSIS — Z7901 Long term (current) use of anticoagulants: Secondary | ICD-10-CM

## 2018-09-24 DIAGNOSIS — Z950 Presence of cardiac pacemaker: Secondary | ICD-10-CM

## 2018-09-24 DIAGNOSIS — G4733 Obstructive sleep apnea (adult) (pediatric): Secondary | ICD-10-CM

## 2018-09-24 DIAGNOSIS — I480 Paroxysmal atrial fibrillation: Secondary | ICD-10-CM

## 2018-09-24 DIAGNOSIS — J84112 Idiopathic pulmonary fibrosis: Secondary | ICD-10-CM | POA: Diagnosis not present

## 2018-09-24 DIAGNOSIS — I495 Sick sinus syndrome: Secondary | ICD-10-CM

## 2018-09-24 NOTE — Patient Instructions (Signed)
Medication Instructions:  Dr Sallyanne Kuster has recommended making the following medication changes: 1. WEAN OFF Metoprolol - take 1 tablet in the evening for 5 days then STOP   If you need a refill on your cardiac medications before your next appointment, please call your pharmacy.   Testing/Procedures: Remote monitoring is used to monitor your Pacemaker of ICD from home. This monitoring reduces the number of office visits required to check your device to one time per year. It allows [place] to keep an eye on the functioning of your device to ensure it is working properly. You are scheduled for a device check from home on Monday, November 4th, 2019. You may send your transmission at any time that day. If you have a wireless device, the transmission will be sent automatically. After your physician reviews your transmission, you will receive a postcard with your next transmission date.  Follow-Up: At [facility], you and your health needs are our priority.  As part of our continuing mission to provide you with exceptional heart care, we have created designated Provider Care Teams.  These Care Teams include your primary Cardiologist (physician) and Advanced Practice Providers (APPs -  Physician Assistants and Nurse Practitioners) who all work together to provide you with the care you need, when you need it. You will need a follow up appointment in 12 months.  Please call our office 2 months in advance to schedule this appointment.  You may see Sanda Klein, MD or one of the following Advanced Practice Providers on your designated Care Team: [place], [place] . Fabian Sharp, PA-C

## 2018-09-24 NOTE — Assessment & Plan Note (Addendum)
I do not hear basilar crackles.  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  She and I agreed to continue monitoring. Plan-CXR today, high-resolution CT chest in May, 2020

## 2018-09-24 NOTE — Progress Notes (Signed)
Cardiology Office Note    Date:  09/24/2018   ID:  [name] [name], DOB February 25, 1941, MRN [id number]  PCP:  Briscoe Deutscher, DO  Cardiologist:   Sanda Klein, MD   Chief Complaint  Patient presents with  . Atrial Fibrillation  . Pacemaker Check    History of Present Illness:  [name] [name] is a 77 y.o. female for follow-up.  Her daughter passed away last 10-27-2023 [name] [name] is still clearly grieving.  Cardiac point of view she is doing well.  Continues to have NYHA functional class II exertional dyspnea but has finished her pulmonary rehab.  She has an occasional discomfort underneath her left breast that is not consistently related to activity.  Denies dizziness, palpitations or syncope.  Complains of fatigue and has a headache that she attributes to beta-blockers.  Occasional very mild ankle swelling.  No bleeding problems, falls or injuries.  Pacemaker interrogation shows a very low burden of atrial fibrillation since her cardioversion about a year ago.  Overall 2.2% atrial fibrillation mostly made up by a 4-day episode that occurred in June rate control is consistently excellent with ventricular rates in the 70s doing all the episodes of atrial fibrillation.  Otherwise, comprehensive pacemaker check today shows normal device function.  The device generator estimates another 6.5 years of service.  She has 95% atrial pacing with good heart rate histogram distribution.  She has less than 1% ventricular pacing.  Is quite steady at about three-point hours per day.  She has evidence of atherosclerosis of the aorta. She does not have angina pectoris. Nuclear stress test in January 2018 was normal/low risk. Her echocardiogram in April 2015 showed evidence of diastolic dysfunction without elevated filling pressures. Similar findings on echo in September 2017 with EF 50-55% and grade 1 diastolic dysfunction without elevated filling pressures. She has not had clinically evident congestive heart  failure in the past. 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 She has a long-standing history of obstructive sleep apnea but uses CPAP. 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  She has a lifelong struggle with obesity and has prediabetes.  She is enrolled in Dr. Migdalia Dk bariatric clinic  Past Medical History:  Diagnosis Date  . Alpha-1-antitrypsin deficiency carrier   . Arthritis    "all over"  . Atrial fibrillation ([place])   . Back pain   . Complication of anesthesia    "I woke up during 2 different procedures" (10/04/2014)  . Frequent UTI   . GERD (gastroesophageal reflux disease)   . Hypertension   . MVP (mitral valve prolapse)   . Obesity   . Osteoarthritis   . Presence of permanent cardiac pacemaker   . PVC's (premature ventricular contractions)   . Shortness of breath   . Sinus arrest 10/26/14   s/p Medtronic Advisa model [id number] serial number [id number]  . Sleep apnea   . Stroke [facility]) 01/2014   denies deficits on 10/04/2014  . Tachy-brady syndrome ([place]) 26-Oct-2014  . TIA (transient ischemic attack)     Past Surgical History:  Procedure Laterality Date  . CARDIOVERSION N/A 09/02/2017  Procedure: CARDIOVERSION;  Surgeon: Sanda Klein, MD;  Location: [facility] ENDOSCOPY;  Service: Cardiovascular;  Laterality: N/A;  . EXCISION VAGINAL CYST     benign nodules  . INSERT / REPLACE / REMOVE PACEMAKER  10/04/2014   Medtronic Advisa model [id number] serial number [id number]  . LOOP RECORDER EXPLANT N/A 10/04/2014   Procedure: LOOP RECORDER  EXPLANT;  Surgeon: Sanda Klein, MD;  Location: [place] CATH LAB;  Service: Cardiovascular;  Laterality: N/A;  . LOOP RECORDER IMPLANT N/A 04/19/2014   Procedure: LOOP RECORDER IMPLANT;  Surgeon: Sanda Klein, MD;  Location: [place] CATH LAB;  Service: Cardiovascular;  Laterality: N/A;  . NM MYOCAR PERF WALL MOTION  12/18/2007   normal  . PERMANENT PACEMAKER INSERTION N/A 10/04/2014   Procedure: PERMANENT PACEMAKER INSERTION;  Surgeon: Sanda Klein, MD;  Location: [place] CATH LAB;  Service: Cardiovascular;  Laterality: N/A;  . TUBAL LIGATION    . US ECHOCARDIOGRAPHY  05/15/2010   LA mildly dilated,mild mitral annular ca+, AOV mildly sclerotic, mild asymmetric LVH    Current Medications: Outpatient Medications Prior to Visit  Medication Sig Dispense Refill  . apixaban (ELIQUIS) 5 MG TABS tablet Take 1 tablet (5 mg total) by mouth 2 (two) times daily. 180 tablet 1  . Biotin 5000 MCG CAPS Take 5,000 mcg by mouth daily.     . Calcium Carbonate-Vit D-Min (CALCIUM 1200 PO) Take 2,400 mg by mouth daily.     . cholecalciferol (VITAMIN D) 1000 UNITS tablet Take 1,000 Units by mouth daily.     Marland Kitchen Co-Enzyme Q10 100 MG CAPS Take 100 mg by mouth daily.     . diazepam (VALIUM) 5 MG tablet TAKE 1 TABLET BY MOUTH ONCE DAILY AS NEEDED FOR ANXIETY 30 tablet 1  . diclofenac sodium (VOLTAREN) 1 % GEL Apply 2 g topically 4 (four) times daily as needed. 200 g 3  . fluticasone (FLONASE) 50 MCG/ACT nasal spray Place 2 sprays into both nostrils daily. 16 g 6  . folic acid (FOLVITE) 314 MCG tablet Take 400 mcg by mouth daily.    Marland Kitchen glucosamine-chondroitin 500-400 MG tablet Take 1 tablet by mouth daily.     . Glycopyrrolate-Formoterol (BEVESPI AEROSPHERE) 9-4.8 MCG/ACT AERO Inhale 2 puffs into the lungs 2 (two) times daily. 1 Inhaler 0  . loratadine (KLS ALLERCLEAR) 10 MG tablet Take 10 mg by mouth daily.    Marland Kitchen MAGNESIUM CITRATE PO Take 250 mg by mouth daily. 1 tab daily    . nitroGLYCERIN (NITROSTAT) 0.4 MG SL tablet Place 0.4  mg under the tongue every 5 (five) minutes as needed for chest pain.    . pantoprazole (PROTONIX) 40 MG tablet TAKE 1 TABLET BY MOUTH DAILY 90 tablet 1  . Resveratrol 250 MG CAPS Take 250 mg by mouth daily.    . sertraline (ZOLOFT) 100 MG tablet TAKE 1 TABLET BY MOUTH ONCE DAILY 90 tablet 0  . Simethicone (GAS-X PO) Take 2 tablets by mouth daily as needed (bloating).    . Spacer/Aero-Holding Chambers (AEROCHAMBER MV) inhaler Use as instructed 1 each 0  . Triamcinolone Acetonide ([employer] NASAL ALLERGY SPRAY NA) Place into the nose.    . metoprolol tartrate (LOPRESSOR) 25 MG tablet Take 0.5 tablets (12.5 mg total) by mouth 2 (two) times daily. Patient needs to schedule appointment for refill 90 tablet 3   No facility-administered medications prior to visit.      Allergies:   Ancef [cefazolin]; Pseudoephedrine hcl; Ergotamine; Pentobarbital; and Caffeine   Social History   Socioeconomic History  .  Marital status: Divorced    Spouse name: Not on file  . Number of children: 3  . Years of education: college  . Highest education level: Not on file  Occupational History  . Occupation: Retired Engineer, site  Social Needs  . Financial resource strain: Not on file  . Food insecurity:    Worry: Not on file    Inability: Not on file  . Transportation needs:    Medical: Not on file    Non-medical: Not on file  Tobacco Use  . Smoking status: Former Smoker    Packs/day: 2.00    Years: 18.00    Pack years: 36.00    Types: Cigarettes  . Smokeless tobacco: Never Used  . Tobacco comment: "quit smoking in 1971"  Substance and Sexual Activity  . Alcohol use: Yes    Alcohol/week: 0.0 standard drinks    Comment: 10/04/2014 "might have a drink a couple times/yr"  . Drug use: No  . Sexual activity: Never  Lifestyle  . Physical activity:    Days per week: Not on file    Minutes per session: Not on file  . Stress: Not on file  Relationships  . Social connections:    Talks on phone: Not on  file    Gets together: Not on file    Attends religious service: Not on file    Active member of club or organization: Not on file    Attends meetings of clubs or organizations: Not on file    Relationship status: Not on file  Other Topics Concern  . Not on file  Social History Narrative   Patient lives alone in one story house.   Patient is right handed.   Patient has a college degree.   Patient is retired.     Family History:  The patient's family history includes Alpha-1 antitrypsin deficiency in her daughter; Depression in her father and mother; Heart attack in her mother; Heart failure in her brother and father; Stroke in her brother and father; Sudden death in her mother.   ROS:   Please see the history of present illness.    ROS all other systems are reviewed and are negative  PHYSICAL EXAM:   VS:  BP 118/72 (BP Location: Right Arm, Patient Position: Sitting, Cuff Size: Normal)   Pulse 86   Ht _0  (1.676 m)   Wt 248 lb (112.5 kg)   LMP  (LMP Unknown)   SpO2 98%   BMI 40.03 kg/m     General: Alert, oriented x3, no distress, severely obese, healthy left subclavian pacemaker site Head: no evidence of trauma, PERRL, EOMI, no exophtalmos or lid lag, no myxedema, no xanthelasma; normal ears, nose and oropharynx Neck: normal jugular venous pulsations and no hepatojugular reflux; brisk carotid pulses without delay and no carotid bruits Chest: clear to auscultation, no signs of consolidation by percussion or palpation, normal fremitus, symmetrical and full respiratory excursions Cardiovascular: normal position and quality of the apical impulse, regular rhythm, normal first and second heart sounds, no murmurs, rubs or gallops Abdomen: no tenderness or distention, no masses by palpation, no abnormal pulsatility or arterial bruits, normal bowel sounds, no hepatosplenomegaly Extremities: no clubbing, cyanosis or edema; 2+ radial, ulnar and brachial pulses bilaterally; 2+ right  femoral, posterior tibial and dorsalis pedis pulses; 2+ left femoral, posterior tibial and dorsalis pedis pulses; no subclavian or femoral bruits Neurological: grossly nonfocal Psych: Normal mood and affect  Wt Readings from Last 3 Encounters:  09/24/18  248 lb (112.5 kg)  09/22/18 242 lb (109.8 kg)  09/21/18 250 lb 9.6 oz (113.7 kg)      Studies/Labs Reviewed:   EKG:  EKG is ordered today.  It shows atrial paced, ventricular sensed rhythm and is otherwise normal.  QTc 406 ms  Recent Labs: 09/22/2018: ALT 17; BUN 19; Creatinine, Ser 0.99; Potassium 4.5; Sodium 142; TSH 2.620   Lipid Panel    Component Value Date/Time   CHOL 176 06/03/2018 1022   TRIG 113 06/03/2018 1022   HDL 53 06/03/2018 1022   CHOLHDL 3 01/15/2017 1417   VLDL 33.6 01/15/2017 1417   LDLCALC 100 (H) 06/03/2018 1022   LDLDIRECT 81.0 07/15/2016 1419    ASSESSMENT:    1. Paroxysmal atrial fibrillation (HCC)   2. Sick sinus syndrome ([place])   3. Pacemaker   4. 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)   5. Long term current use of anticoagulant   6. Morbid obesity ([place])   7. OSA (obstructive sleep apnea)      PLAN:  In order of problems listed above:  1. AFib: CHADSVasc 5 (Age 48, gender, TIA).  The burden of atrial fibrillation is low.  Antiarrhythmics are not indicated.  Rate control is excellent and will probably remain satisfactory even without the beta-blocker.  Will discontinue it due to side effects.. 2. SSS: when not in atrial fibrillation she had a very high prevalence of atrial pacing with satisfactory heart rate histogram distribution with the current pacemaker settings. 3. PPM: Normal device function, remote downloads every 3 months and yearly office visits 4. Dyspnea: multifactorial, related to obesity, lung fibrosis, COPD. BNP normal in 2018. Echo 2017 with normal EF, mild diastolic dysfunction without evidence of elevated filling pressures. 5. Anticoagulation: no bleeding complications on Eliquis.   Compliant with anticoagulants 6. Obesity: She is repeatedly discouraged and depressed about her inability to lose weight but I think she has actually made reasonably good progress in the last couple of years.  Encouraged her to continue her efforts. 7. OSA reports compliance with CPAP.    Medication Adjustments/Labs and Tests Ordered: Current medicines are reviewed at length with the patient today.  Concerns regarding medicines are outlined above.  Medication changes, Labs and Tests ordered today are listed in the Patient Instructions below. Patient Instructions  Medication Instructions:  Dr Sallyanne Kuster has recommended making the following medication changes: 1. WEAN OFF Metoprolol - take 1 tablet in the evening for 5 days then STOP   If you need a refill on your cardiac medications before your next appointment, please call your pharmacy.   Testing/Procedures: Remote monitoring is used to monitor your Pacemaker of ICD from home. This monitoring reduces the number of office visits required to check your device to one time per year. It allows [place] to keep an eye on the functioning of your device to ensure it is working properly. You are scheduled for a device check from home on Monday, November 4th, 2019. You may send your transmission at any time that day. If you have a wireless device, the transmission will be sent automatically. After your physician reviews your transmission, you will receive a postcard with your next transmission date.  Follow-Up: At [facility], you and your health needs are our priority.  As part of our continuing mission to provide you with exceptional heart care, we have created designated Provider Care Teams.  These Care Teams include your primary Cardiologist (physician) and Advanced Practice Providers (APPs -  Physician Assistants and Nurse Practitioners)  who all work together to provide you with the care you need, when you need it. You will need a follow up appointment in  12 months.  Please call our office 2 months in advance to schedule this appointment.  You may see Sanda Klein, MD or one of the following Advanced Practice Providers on your designated Care Team: [place], [place] . Fabian Sharp, PA-C    Signed, Sanda Klein, MD  09/24/2018 12:46 PM    [address], [place], [place]  [address] Phone: [phone]; Fax: [phone]

## 2018-09-24 NOTE — Assessment & Plan Note (Signed)
Her obesity and deconditioning, enlarged heart and atrial fibrillation contribute to dyspnea. Plan-sample Bevespi for symptom trial.

## 2018-09-24 NOTE — Assessment & Plan Note (Signed)
We can monitor with imaging as needed but concern is low.

## 2018-09-25 DIAGNOSIS — H1045 Other chronic allergic conjunctivitis: Secondary | ICD-10-CM | POA: Diagnosis not present

## 2018-09-25 DIAGNOSIS — H04123 Dry eye syndrome of bilateral lacrimal glands: Secondary | ICD-10-CM | POA: Diagnosis not present

## 2018-10-05 ENCOUNTER — Ambulatory Visit (INDEPENDENT_AMBULATORY_CARE_PROVIDER_SITE_OTHER): Payer: Medicare Other | Admitting: *Deleted

## 2018-10-05 DIAGNOSIS — I495 Sick sinus syndrome: Secondary | ICD-10-CM

## 2018-10-05 NOTE — Progress Notes (Signed)
Remote pacemaker transmission.   

## 2018-10-06 ENCOUNTER — Encounter (INDEPENDENT_AMBULATORY_CARE_PROVIDER_SITE_OTHER): Payer: Self-pay | Admitting: Family Medicine

## 2018-10-06 ENCOUNTER — Ambulatory Visit (INDEPENDENT_AMBULATORY_CARE_PROVIDER_SITE_OTHER): Payer: Medicare Other | Admitting: Family Medicine

## 2018-10-06 VITALS — BP 110/70 | HR 80 | Temp 97.9°F | Ht 66.0 in | Wt 244.0 lb

## 2018-10-06 DIAGNOSIS — Z6839 Body mass index (BMI) 39.0-39.9, adult: Secondary | ICD-10-CM

## 2018-10-06 DIAGNOSIS — F3289 Other specified depressive episodes: Secondary | ICD-10-CM

## 2018-10-06 DIAGNOSIS — R7303 Prediabetes: Secondary | ICD-10-CM | POA: Diagnosis not present

## 2018-10-06 NOTE — Progress Notes (Signed)
Office: [phone]  /  Fax: [phone]   HPI:   Chief Complaint: OBESITY [name] [name] is here to discuss her progress with her obesity treatment plan. She is on the  portion control better and make smarter food choices, such as increase vegetables and decrease simple carbohydrates  and is following her eating plan approximately 0% of the time. She states she is swimming and using the foot cycle for 30 minutes 2 times per week. [name] [name] is currently struggling with sweets cravings. Her brother recently moved out and she is slowly removing the junk food from her house. He kept quite a bit of junk food around. She is on the waiting list at assisted living which she believes will be a positive change.   Her weight is 244 lb (110.7 kg) today and has not lost weight since her last visit. She has lost 23 lbs since starting treatment with [place].  Pre-Diabetes [name] [name] has a diagnosis of prediabetes based on her elevated HgA1c (5.8 on 09/22/18) and was informed this puts her at greater risk of developing diabetes. She is not taking metformin currently and continues to work on diet and exercise to decrease risk of diabetes. She denies nausea or hypoglycemia.  Depression with emotional eating behaviors [name] [name] is struggling with emotional eating and using food for comfort to the extent that it is negatively impacting her health. This week marks the anniversary of her daughter's death and it is a very hard time of year for her. She reports it will improve in time as it always does.  She has been working on behavior modification techniques to help reduce her emotional eating and has been minimally successful. She shows no sign of suicidal or homicidal ideations.  Depression screen [name] [name] 2/9 09/16/2018 08/18/2018 05/07/2018 05/07/2018 01/21/2018  Decreased Interest 0 1 0 0 1  Down, Depressed, Hopeless 2 1 0 0 1  PHQ - 2 Score 2 2 0 0 2  Altered sleeping 1 1 0 0 1  Tired, decreased energy 3 3 0 0 2  Change in appetite 3 3  0 0 0  Feeling bad or failure about yourself  3 2 0 0 2  Trouble concentrating 0 1 0 0 2  Moving slowly or fidgety/restless 0 0 0 0 2  Suicidal thoughts 0 0 0 0 1  PHQ-9 Score 12 12 0 0 12  Difficult doing work/chores - - - Not difficult at all Somewhat difficult  Some recent data might be hidden    ALLERGIES: Allergies  Allergen Reactions  . Ancef [Cefazolin] Hives and Itching  . Pseudoephedrine Hcl     Other reaction(s): palpitations (moderate)  . Ergotamine   . Pentobarbital   . Caffeine Palpitations    MEDICATIONS: Current Outpatient Medications on File Prior to Visit  Medication Sig Dispense Refill  . apixaban (ELIQUIS) 5 MG TABS tablet Take 1 tablet (5 mg total) by mouth 2 (two) times daily. 180 tablet 1  . Biotin 5000 MCG CAPS Take 5,000 mcg by mouth daily.     . Calcium Carbonate-Vit D-Min (CALCIUM 1200 PO) Take 2,400 mg by mouth daily.     . cholecalciferol (VITAMIN D) 1000 UNITS tablet Take 1,000 Units by mouth daily.     Marland Kitchen Co-Enzyme Q10 100 MG CAPS Take 100 mg by mouth daily.     . diazepam (VALIUM) 5 MG tablet TAKE 1 TABLET BY MOUTH ONCE DAILY AS NEEDED FOR ANXIETY 30 tablet 1  . diclofenac sodium (VOLTAREN) 1 % GEL Apply  2 g topically 4 (four) times daily as needed. 200 g 3  . fluticasone (FLONASE) 50 MCG/ACT nasal spray Place 2 sprays into both nostrils daily. 16 g 6  . folic acid (FOLVITE) 035 MCG tablet Take 400 mcg by mouth daily.    Marland Kitchen glucosamine-chondroitin 500-400 MG tablet Take 1 tablet by mouth daily.     . Glycopyrrolate-Formoterol (BEVESPI AEROSPHERE) 9-4.8 MCG/ACT AERO Inhale 2 puffs into the lungs 2 (two) times daily. 1 Inhaler 0  . loratadine (KLS ALLERCLEAR) 10 MG tablet Take 10 mg by mouth daily.    Marland Kitchen MAGNESIUM CITRATE PO Take 250 mg by mouth daily. 1 tab daily    . nitroGLYCERIN (NITROSTAT) 0.4 MG SL tablet Place 0.4 mg under the tongue every 5 (five) minutes as needed for chest pain.    . pantoprazole (PROTONIX) 40 MG tablet TAKE 1 TABLET BY MOUTH  DAILY 90 tablet 1  . Resveratrol 250 MG CAPS Take 250 mg by mouth daily.    . sertraline (ZOLOFT) 100 MG tablet TAKE 1 TABLET BY MOUTH ONCE DAILY 90 tablet 0  . Simethicone (GAS-X PO) Take 2 tablets by mouth daily as needed (bloating).    . Spacer/Aero-Holding Chambers (AEROCHAMBER MV) inhaler Use as instructed 1 each 0  . Triamcinolone Acetonide ([employer] NASAL ALLERGY SPRAY NA) Place into the nose.     No current facility-administered medications on file prior to visit.     PAST MEDICAL HISTORY: Past Medical History:  Diagnosis Date  . Alpha-1-antitrypsin deficiency carrier   . Arthritis    "all over"  . Atrial fibrillation ([place])   . Back pain   . Complication of anesthesia    "I woke up during 2 different procedures" (10/04/2014)  . Frequent UTI   . GERD (gastroesophageal reflux disease)   . Hypertension   . MVP (mitral valve prolapse)   . Obesity   . Osteoarthritis   . Presence of permanent cardiac pacemaker   . PVC's (premature ventricular contractions)   . Shortness of breath   . 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 Sleep apnea   . Stroke [facility]) 01/2014   denies deficits on 10/04/2014  . Tachy-brady syndrome ([place]) 10/2014  . TIA (transient ischemic attack)     PAST SURGICAL HISTORY: Past Surgical History:  Procedure Laterality Date  . CARDIOVERSION N/A 09/02/2017   Procedure: CARDIOVERSION;  Surgeon: Sanda Klein, MD;  Location: [facility] ENDOSCOPY;  Service: Cardiovascular;  Laterality: N/A;  . EXCISION VAGINAL CYST     benign nodules  . INSERT / REPLACE / REMOVE PACEMAKER  10/04/2014   Medtronic Advisa model [id number] serial number [id number]  . LOOP RECORDER EXPLANT N/A 10/04/2014   Procedure: LOOP RECORDER EXPLANT;  Surgeon: Sanda Klein, MD;  Location: [place] CATH LAB;  Service: Cardiovascular;  Laterality: N/A;  . LOOP RECORDER IMPLANT N/A 04/19/2014   Procedure: LOOP RECORDER IMPLANT;  Surgeon: Sanda Klein, MD;  Location: [place]  CATH LAB;  Service: Cardiovascular;  Laterality: N/A;  . NM MYOCAR PERF WALL MOTION  12/18/2007   normal  . PERMANENT PACEMAKER INSERTION N/A 10/04/2014   Procedure: PERMANENT PACEMAKER INSERTION;  Surgeon: Sanda Klein, MD;  Location: [place] CATH LAB;  Service: Cardiovascular;  Laterality: N/A;  . TUBAL LIGATION    . US ECHOCARDIOGRAPHY  05/15/2010   LA mildly dilated,mild mitral annular ca+, AOV mildly sclerotic, mild asymmetric LVH    SOCIAL HISTORY: Social History   Tobacco Use  . Smoking status:  Former Smoker    Packs/day: 2.00    Years: 18.00    Pack years: 36.00    Types: Cigarettes  . Smokeless tobacco: Never Used  . Tobacco comment: "quit smoking in 1971"  Substance Use Topics  . Alcohol use: Yes    Alcohol/week: 0.0 standard drinks    Comment: 10/04/2014 "might have a drink a couple times/yr"  . Drug use: No    FAMILY HISTORY: Family History  Problem Relation Age of Onset  . Heart attack Mother   . Sudden death Mother   . Depression Mother   . Stroke Father   . Heart failure Father   . Depression Father   . Heart failure Brother   . Stroke Brother   . Alpha-1 antitrypsin deficiency Daughter     ROS: Review of Systems  Constitutional: Negative for weight loss.  Gastrointestinal: Negative for nausea.  Endo/Heme/Allergies:       Negative for hypoglycemia. Positive for polyphagia.  Psychiatric/Behavioral: Positive for depression. Negative for suicidal ideas.       Negative for homicidal ideation.     PHYSICAL EXAM: Blood pressure 110/70, pulse 80, temperature 97.9 F (36.6 C), temperature source Oral, height _0  (1.676 m), weight 244 lb (110.7 kg), SpO2 100 %. Body mass index is 39.38 kg/m. Physical Exam  Constitutional: She is oriented to person, place, and time. She appears well-developed and well-nourished.  Cardiovascular: Normal rate.  Pulmonary/Chest: Effort normal.  Musculoskeletal: Normal range of motion.  Neurological: She is alert and  oriented to person, place, and time.  Skin: Skin is warm and dry.  Psychiatric: She has a normal mood and affect. Her behavior is normal.  Vitals reviewed.   RECENT LABS AND TESTS: BMET    Component Value Date/Time   NA 142 09/22/2018 1242   K 4.5 09/22/2018 1242   CL 102 09/22/2018 1242   CO2 25 09/22/2018 1242   GLUCOSE 114 (H) 09/22/2018 1242   GLUCOSE 92 01/15/2017 1417   BUN 19 09/22/2018 1242   CREATININE 0.99 09/22/2018 1242   CREATININE 0.86 09/28/2014 1557   CALCIUM 9.2 09/22/2018 1242   GFRNONAA 55 (L) 09/22/2018 1242   GFRNONAA 56 (L) 06/06/2014 1653   GFRAA 64 09/22/2018 1242   GFRAA 65 06/06/2014 1653   Lab Results  Component Value Date   HGBA1C 5.8 (H) 09/22/2018   HGBA1C 5.8 (H) 06/03/2018   HGBA1C 5.8 (H) 12/24/2017   HGBA1C 6.2 (H) 09/17/2017   HGBA1C 5.9 (H) 03/09/2014   Lab Results  Component Value Date   INSULIN 24.2 09/22/2018   INSULIN 17.6 06/03/2018   INSULIN 19.4 12/24/2017   INSULIN 47.7 (H) 09/17/2017   CBC    Component Value Date/Time   WBC 8.5 09/17/2017 1131   WBC 9.1 02/24/2017 1555   RBC 5.22 09/17/2017 1131   RBC 5.09 02/24/2017 1555   HGB 14.9 09/17/2017 1131   HCT 45.1 09/17/2017 1131   PLT 258 08/27/2017 1509   MCV 86 09/17/2017 1131   MCH 28.5 09/17/2017 1131   MCH 29.0 09/28/2014 1557   MCHC 33.0 09/17/2017 1131   MCHC 33.9 02/24/2017 1555   RDW 13.5 09/17/2017 1131   LYMPHSABS 2.0 09/17/2017 1131   MONOABS 1.3 (H) 02/24/2017 1555   EOSABS 0.2 09/17/2017 1131   BASOSABS 0.0 09/17/2017 1131   Iron/TIBC/Ferritin/ %Sat No results found for: IRON, TIBC, FERRITIN, IRONPCTSAT Lipid Panel     Component Value Date/Time   CHOL 176 06/03/2018 1022   TRIG 113  06/03/2018 1022   HDL 53 06/03/2018 1022   CHOLHDL 3 01/15/2017 1417   VLDL 33.6 01/15/2017 1417   LDLCALC 100 (H) 06/03/2018 1022   LDLDIRECT 81.0 07/15/2016 1419   Hepatic Function Panel     Component Value Date/Time   PROT 6.5 09/22/2018 1242   ALBUMIN  4.0 09/22/2018 1242   AST 24 09/22/2018 1242   ALT 17 09/22/2018 1242   ALKPHOS 74 09/22/2018 1242   BILITOT 0.4 09/22/2018 1242   BILIDIR 0.1 01/15/2017 1417      Component Value Date/Time   TSH 2.620 09/22/2018 1242   TSH 2.040 09/17/2017 1131   TSH 2.70 07/15/2016 1419   Results for DOMINIQUA, COONER (MRN [id number]) as of 10/06/2018 15:20  Ref. Range 06/03/2018 10:22  Vitamin D, 25-Hydroxy Latest Ref Range: 30.0 - 100.0 ng/mL 53.1    ASSESSMENT AND PLAN: Prediabetes  Other depression - with emotional eating  Class 2 severe obesity with serious comorbidity and body mass index (BMI) of 39.0 to 39.9 in adult, unspecified obesity type [facility])  PLAN: Pre-Diabetes Davy will continue to work on weight loss, exercise, and decreasing simple carbohydrates in her diet to help decrease the risk of diabetes. We dicussed metformin including benefits and risks but she defers metformin today. She would like to control her prediabetes with diet.  Teighan agreed to follow up with [place] as directed to monitor her progress.  Depression with Emotional Eating Behaviors We discussed behavior modification techniques today to help Loda deal with her emotional eating and depression. She will continue to take Zoloft per her PCP. She does not want to follow up with Dr. Mallie Mussel at this time. Encouragement was provided.   Obesity Taliyah is currently in the action stage of change. As such, her goal is to continue with weight loss efforts She has agreed to portion control better and make smarter food choices, such as increase vegetables and decrease simple carbohydrates  Mickayla has been instructed to continue swimming and using the foot cycle for 30 minutes 2 times per week. We discussed the following Behavioral Modification Strategies today: increasing lean protein intake, decreasing simple carbohydrates, keeping healthy foods in her home,  and decrease junk food   Anara has agreed to follow up with our clinic  in 2 weeks. She was informed of the importance of frequent follow up visits to maximize her success with intensive lifestyle modifications for her multiple health conditions.   OBESITY BEHAVIORAL INTERVENTION VISIT  Today's visit was # 21  Starting weight: 267 lbs Starting date: 09/17/17 Today's weight : Weight: 244 lb (110.7 kg)  Today's date: 10/06/2018 Total lbs lost to date: 23 At least 15 minutes were spent on discussing the following behavioral intervention visit.   ASK: We discussed the diagnosis of obesity with [name] today and Paloma agreed to give [place] permission to discuss obesity behavioral modification therapy today.  ASSESS: Mozell has the diagnosis of obesity and her BMI today is 39.4. Patrick is in the action stage of change   ADVISE: Francesca was educated on the multiple health risks of obesity as well as the benefit of weight loss to improve her health. She was advised of the need for long term treatment and the importance of lifestyle modifications to improve her current health and to decrease her risk of future health problems.  AGREE: Multiple dietary modification options and treatment options were discussed and  Angely agreed to follow the recommendations documented in the above note.  ARRANGE: [name]  was educated on the importance of frequent visits to treat obesity as outlined per CMS and USPSTF guidelines and agreed to schedule her next follow up appointment today.

## 2018-10-08 ENCOUNTER — Encounter: Payer: Self-pay | Admitting: Cardiology

## 2018-10-09 ENCOUNTER — Telehealth: Payer: Self-pay | Admitting: Cardiovascular Disease

## 2018-10-09 NOTE — Telephone Encounter (Signed)
New message:       Patient calling the office for samples of medication:   1.  What medication and dosage are you requesting samples for? Eliquis  2.  Are you currently out of this medication? Yes

## 2018-10-09 NOTE — Telephone Encounter (Signed)
New message   Patient calling the office for samples of medication:   1.  What medication and dosage are you requesting samples for?apixaban (ELIQUIS) 5 MG TABS tablet  2.  Are you currently out of this medication? No

## 2018-10-09 NOTE — Telephone Encounter (Signed)
Medication samples have been provided to the patient.  Drug name: Eliquis 5 mg  Qty: 28 tablets    LOT: [id number]  Exp.Date: 11/21  Samples left at front desk for patient pick-up. Patient notified.

## 2018-10-09 NOTE — Telephone Encounter (Signed)
All samples are thru triage

## 2018-10-13 ENCOUNTER — Ambulatory Visit: Payer: Self-pay

## 2018-10-13 NOTE — Telephone Encounter (Signed)
Reviewed with Dr Juleen China app ok will go to ED if changes in symptoms.

## 2018-10-13 NOTE — Telephone Encounter (Signed)
Pt. Reports she has a long history of reflux and feels like it is getting worse. Has pain after eating certain things. Pain is up "under my ribs.It does not radiate anywhere and it is not my heart. I have a pacemaker and other things ." "I'm taking Protonix, but maybe I need something else." Sometimes eating or drinking something cold makes it feel better. Has a transition of care in December, but requests to be seen for this issue. Appointment for tomorrow. Instructed if symptoms worsen, to go to ED. Verbalizes understanding. Reason for Disposition . [1] Chest pain lasting <= 5 minutes AND [2] NO chest pain or cardiac symptoms now(Exceptions: pains lasting a few seconds)  Answer Assessment - Initial Assessment Questions 1. LOCATION: "Where does it hurt?"       Both side in her ribs 2. RADIATION: "Does the pain go anywhere else?" (e.g., into neck, jaw, arms, back)     No 3. ONSET: "When did the chest pain begin?" (Minutes, hours or days)      Started  4. PATTERN "Does the pain come and go, or has it been constant since it started?"  "Does it get worse with exertion?"      Comes and goes 5. DURATION: "How long does it last" (e.g., seconds, minutes, hours)     Hurts after eating and on going 6. SEVERITY: "How bad is the pain?"  (e.g., Scale 1-10; mild, moderate, or severe)    - MILD (1-3): doesn't interfere with normal activities     - MODERATE (4-7): interferes with normal activities or awakens from sleep    - SEVERE (8-10): excruciating pain, unable to do any normal activities       Moderate 7. CARDIAC RISK FACTORS: "Do you have any history of heart problems or risk factors for heart disease?" (e.g., prior heart attack, angina; high blood pressure, diabetes, being overweight, high cholesterol, smoking, or strong family history of heart disease)     Pacemaker 8. PULMONARY RISK FACTORS: "Do you have any history of lung disease?"  (e.g., blood clots in lung, asthma, emphysema, birth control  pills)     Pulmonary fibrosis 9. CAUSE: "What do you think is causing the chest pain?"     It feels like my reflux 10. OTHER SYMPTOMS: "Do you have any other symptoms?" (e.g., dizziness, nausea, vomiting, sweating, fever, difficulty breathing, cough)        No 11. PREGNANCY: "Is there any chance you are pregnant?" "When was your last menstrual period?"       No  Protocols used: CHEST PAIN-A-AH

## 2018-10-14 ENCOUNTER — Ambulatory Visit (INDEPENDENT_AMBULATORY_CARE_PROVIDER_SITE_OTHER): Payer: Medicare Other | Admitting: Family Medicine

## 2018-10-14 ENCOUNTER — Encounter: Payer: Self-pay | Admitting: Family Medicine

## 2018-10-14 VITALS — BP 138/64 | HR 90 | Temp 98.0°F | Ht 66.0 in | Wt 247.0 lb

## 2018-10-14 DIAGNOSIS — I5189 Other ill-defined heart diseases: Secondary | ICD-10-CM | POA: Diagnosis not present

## 2018-10-14 DIAGNOSIS — F341 Dysthymic disorder: Secondary | ICD-10-CM

## 2018-10-14 DIAGNOSIS — Z9989 Dependence on other enabling machines and devices: Secondary | ICD-10-CM

## 2018-10-14 DIAGNOSIS — M8580 Other specified disorders of bone density and structure, unspecified site: Secondary | ICD-10-CM

## 2018-10-14 DIAGNOSIS — R1084 Generalized abdominal pain: Secondary | ICD-10-CM | POA: Diagnosis not present

## 2018-10-14 DIAGNOSIS — I480 Paroxysmal atrial fibrillation: Secondary | ICD-10-CM | POA: Diagnosis not present

## 2018-10-14 DIAGNOSIS — G4733 Obstructive sleep apnea (adult) (pediatric): Secondary | ICD-10-CM

## 2018-10-14 DIAGNOSIS — R350 Frequency of micturition: Secondary | ICD-10-CM

## 2018-10-14 DIAGNOSIS — R195 Other fecal abnormalities: Secondary | ICD-10-CM

## 2018-10-14 DIAGNOSIS — I700 Atherosclerosis of aorta: Secondary | ICD-10-CM

## 2018-10-14 DIAGNOSIS — J302 Other seasonal allergic rhinitis: Secondary | ICD-10-CM

## 2018-10-14 DIAGNOSIS — E559 Vitamin D deficiency, unspecified: Secondary | ICD-10-CM

## 2018-10-14 DIAGNOSIS — R0789 Other chest pain: Secondary | ICD-10-CM | POA: Diagnosis not present

## 2018-10-14 DIAGNOSIS — J8410 Pulmonary fibrosis, unspecified: Secondary | ICD-10-CM

## 2018-10-14 DIAGNOSIS — R7303 Prediabetes: Secondary | ICD-10-CM

## 2018-10-14 LAB — CBC WITH DIFFERENTIAL/PLATELET
Basophils Absolute: 0.1 10*3/uL (ref 0.0–0.1)
Basophils Relative: 0.7 % (ref 0.0–3.0)
Eosinophils Absolute: 0.2 10*3/uL (ref 0.0–0.7)
Eosinophils Relative: 3 % (ref 0.0–5.0)
HCT: 42.2 % (ref 36.0–46.0)
Hemoglobin: 14.2 g/dL (ref 12.0–15.0)
Lymphocytes Relative: 26.4 % (ref 12.0–46.0)
Lymphs Abs: 2 10*3/uL (ref 0.7–4.0)
MCHC: 33.6 g/dL (ref 30.0–36.0)
MCV: 86.8 fl (ref 78.0–100.0)
Monocytes Absolute: 1 10*3/uL (ref 0.1–1.0)
Monocytes Relative: 12.5 % — ABNORMAL HIGH (ref 3.0–12.0)
Neutro Abs: 4.4 10*3/uL (ref 1.4–7.7)
Neutrophils Relative %: 57.4 % (ref 43.0–77.0)
Platelets: 258 10*3/uL (ref 150.0–400.0)
RBC: 4.86 Mil/uL (ref 3.87–5.11)
RDW: 12.9 % (ref 11.5–15.5)
WBC: 7.6 10*3/uL (ref 4.0–10.5)

## 2018-10-14 LAB — URINALYSIS, ROUTINE W REFLEX MICROSCOPIC
Bilirubin Urine: NEGATIVE
Ketones, ur: NEGATIVE
Leukocytes, UA: NEGATIVE
Nitrite: NEGATIVE
Specific Gravity, Urine: 1.01 (ref 1.000–1.030)
Total Protein, Urine: NEGATIVE
Urine Glucose: NEGATIVE
Urobilinogen, UA: 0.2 (ref 0.0–1.0)
pH: 6 (ref 5.0–8.0)

## 2018-10-14 LAB — LIPASE: Lipase: 32 U/L (ref 11.0–59.0)

## 2018-10-14 LAB — COMPREHENSIVE METABOLIC PANEL
ALT: 13 U/L (ref 0–35)
AST: 19 U/L (ref 0–37)
Albumin: 3.9 g/dL (ref 3.5–5.2)
Alkaline Phosphatase: 58 U/L (ref 39–117)
BUN: 29 mg/dL — ABNORMAL HIGH (ref 6–23)
CO2: 30 mEq/L (ref 19–32)
Calcium: 9 mg/dL (ref 8.4–10.5)
Chloride: 104 mEq/L (ref 96–112)
Creatinine, Ser: 0.99 mg/dL (ref 0.40–1.20)
GFR: 57.66 mL/min — ABNORMAL LOW (ref >60.00)
Glucose, Bld: 111 mg/dL — ABNORMAL HIGH (ref 70–99)
Potassium: 4.4 mEq/L (ref 3.5–5.1)
Sodium: 137 mEq/L (ref 135–145)
Total Bilirubin: 0.3 mg/dL (ref 0.2–1.2)
Total Protein: 7.2 g/dL (ref 6.0–8.3)

## 2018-10-14 LAB — H. PYLORI ANTIBODY, IGG: H Pylori IgG: NEGATIVE

## 2018-10-14 MED ORDER — DEXLANSOPRAZOLE 30 MG PO CPDR: 30.0000 mg | DELAYED_RELEASE_CAPSULE | Freq: Every day | ORAL | 2 refills | Status: DC

## 2018-10-14 MED ORDER — SUCRALFATE 1 G PO TABS: 1.0000 g | ORAL_TABLET | Freq: Three times a day (TID) | ORAL | 1 refills | Status: DC

## 2018-10-14 NOTE — Progress Notes (Signed)
[name] [name] is a 77 y.o. female is here for follow up.  History of Present Illness:   Abdominal Pain  This is a new problem. The current episode started 1 to 4 weeks ago. The problem occurs constantly. The problem has been unchanged. The pain is located in the LUQ and RUQ. The pain is at a severity of 1/10. The pain is mild. The quality of the pain is dull and aching. Pain radiation: right side of back  Associated symptoms include belching and frequency. Pertinent negatives include no constipation, diarrhea, dysuria, fever, nausea or vomiting. Relieved by: milk shake last night helps  The treatment provided no relief.   Review of Systems  Constitutional: Negative for fever.  Gastrointestinal: Positive for abdominal pain. Negative for constipation, diarrhea, nausea and vomiting.  Genitourinary: Positive for frequency. Negative for dysuria.   Health Maintenance Due  Topic Date Due  . MAMMOGRAM  03/22/2018   Depression screen [facility] 2/9 09/16/2018 08/18/2018 05/07/2018  Decreased Interest 0 1 0  Down, Depressed, Hopeless 2 1 0  PHQ - 2 Score 2 2 0  Altered sleeping 1 1 0  Tired, decreased energy 3 3 0  Change in appetite 3 3 0  Feeling bad or failure about yourself  3 2 0  Trouble concentrating 0 1 0  Moving slowly or fidgety/restless 0 0 0  Suicidal thoughts 0 0 0  PHQ-9 Score 12 12 0  Difficult doing work/chores - - -  Some recent data might be hidden   PMHx, SurgHx, SocialHx, FamHx, Medications, and Allergies were reviewed in the Visit Navigator and updated as appropriate.   Patient Active Problem List   Diagnosis Date Noted  . Dark stools 10/15/2018  . Generalized abdominal pain 10/15/2018  . Despression with emotional eating behaviors, followed by [facility] 10/15/2018  . Seasonal allergies, using Flonase and Loratadine prn 10/15/2018  . Atherosclerosis of aorta ([place]) 10/15/2018  . Diastolic dysfunction 66/44/0347  . Prediabetes 11/05/2017  . Vitamin D deficiency 09/17/2017  .  Class 3 severe obesity with serious comorbidity and body mass index (BMI) of 40.0 to 44.9 in adult ([place]) 09/17/2017  . Hypertriglyceridemia 09/17/2017  . Multiple lung nodules 05/23/2017  . Pulmonary fibrosis ([place]), followed by Pulmonolgy 05/23/2017  . Abnormal CT scan of lung 01/07/2017  . GERD (gastroesophageal reflux disease) 12/12/2016  . Dyspnea on exertion 12/10/2016  . Anxiety state 07/15/2016  . OSA on CPAP, compliant 05/04/2016  . Sinus arrest 10/04/2014  . Pacemaker 10/04/2014  . Long term current use of anticoagulant 06/27/2014  . PAF (paroxysmal atrial fibrillation) ([place]) 06/06/2014  . Tachy-brady syndrome ([place]) 06/06/2014  . TIA (transient ischemic attack) 03/08/2014  . Osteopenia 08/15/2008   Social History   Tobacco Use  . Smoking status: Former Smoker    Packs/day: 2.00    Years: 18.00    Pack years: 36.00    Types: Cigarettes  . Smokeless tobacco: Never Used  . Tobacco comment: "quit smoking in 1971"  Substance Use Topics  . Alcohol use: Yes    Alcohol/week: 0.0 standard drinks    Comment: 10/04/2014 "might have a drink a couple times/yr"  . Drug use: No   Current Medications and Allergies:   .  apixaban (ELIQUIS) 5 MG TABS tablet, Take 1 tablet (5 mg total) by mouth 2 (two) times daily., Disp: 180 tablet, Rfl: 1 .  Biotin 5000 MCG CAPS, Take 5,000 mcg by mouth daily. , Disp: , Rfl:  .  Calcium Carbonate-Vit D-Min (CALCIUM  1200 PO), Take 2,400 mg by mouth daily. , Disp: , Rfl:  .  cholecalciferol (VITAMIN D) 1000 UNITS tablet, Take 1,000 Units by mouth daily. , Disp: , Rfl:  .  Co-Enzyme Q10 100 MG CAPS, Take 100 mg by mouth daily. , Disp: , Rfl:  .  diazepam (VALIUM) 5 MG tablet, TAKE 1 TABLET BY MOUTH ONCE DAILY AS NEEDED FOR ANXIETY, Disp: 30 tablet, Rfl: 1 .  diclofenac sodium (VOLTAREN) 1 % GEL, Apply 2 g topically 4 (four) times daily as needed., Disp: 200 g, Rfl: 3 .  fluticasone (FLONASE) 50 MCG/ACT nasal spray, Place 2 sprays into both nostrils  daily., Disp: 16 g, Rfl: 6 .  folic acid (FOLVITE) 161 MCG tablet, Take 400 mcg by mouth daily., Disp: , Rfl:  .  glucosamine-chondroitin 500-400 MG tablet, Take 1 tablet by mouth daily. , Disp: , Rfl:  .  Glycopyrrolate-Formoterol (BEVESPI AEROSPHERE) 9-4.8 MCG/ACT AERO, Inhale 2 puffs into the lungs 2 (two) times daily., Disp: 1 Inhaler, Rfl: 0 .  loratadine (KLS ALLERCLEAR) 10 MG tablet, Take 10 mg by mouth daily., Disp: , Rfl:  .  MAGNESIUM CITRATE PO, Take 250 mg by mouth daily. 1 tab daily, Disp: , Rfl:  .  nitroGLYCERIN (NITROSTAT) 0.4 MG SL tablet, Place 0.4 mg under the tongue every 5 (five) minutes as needed for chest pain., Disp: , Rfl:  .  pantoprazole (PROTONIX) 40 MG tablet, TAKE 1 TABLET BY MOUTH DAILY, Disp: 90 tablet, Rfl: 1 .  Resveratrol 250 MG CAPS, Take 250 mg by mouth daily., Disp: , Rfl:  .  sertraline (ZOLOFT) 100 MG tablet, TAKE 1 TABLET BY MOUTH ONCE DAILY, Disp: 90 tablet, Rfl: 0 .  Simethicone (GAS-X PO), Take 2 tablets by mouth daily as needed (bloating)., Disp: , Rfl:  .  Spacer/Aero-Holding Chambers (AEROCHAMBER MV) inhaler, Use as instructed, Disp: 1 each, Rfl: 0 .  Triamcinolone Acetonide ([employer] NASAL ALLERGY SPRAY NA), Place into the nose., Disp: , Rfl:    Allergies  Allergen Reactions  . Ancef [Cefazolin] Hives and Itching  . Pseudoephedrine Hcl     Other reaction(s): palpitations (moderate)  . Ergotamine   . Pentobarbital   . Caffeine Palpitations   Review of Systems   Pertinent items are noted in the HPI. Otherwise, ROS is negative.  Vitals:   Vitals:   10/14/18 0941  BP: 138/64  Pulse: 90  Temp: 98 F (36.7 C)  TempSrc: Oral  SpO2: 94%  Weight: 247 lb (112 kg)  Height: _0  (1.676 m)     Body mass index is 39.87 kg/m.  Physical Exam:   Physical Exam  Constitutional: She is oriented to person, place, and time. She appears well-developed and well-nourished.  HENT:  Head: Normocephalic and atraumatic.  Eyes: Pupils are equal, round,  and reactive to light. EOM are normal.  Neck: Normal range of motion. Neck supple.  Cardiovascular: Normal rate, regular rhythm, normal heart sounds and intact distal pulses.  Pulmonary/Chest: Effort normal.  Abdominal: Soft. Bowel sounds are normal. She exhibits no distension and no mass. There is tenderness in the right upper quadrant and epigastric area. There is no rigidity and no guarding.  Musculoskeletal: Normal range of motion.  Neurological: She is alert and oriented to person, place, and time.  Skin: Skin is warm and dry.  Psychiatric: She has a normal mood and affect. Her behavior is normal.  Nursing note and vitals reviewed.  Assessment and Plan:   1. Atypical chest pain -  EKG 12-Lead  2. Dark stools - Fecal occult blood, imunochemical; Future - CBC with Differential/Platelet - Comprehensive metabolic panel - Urinalysis, Routine w reflex microscopic - sucralfate (CARAFATE) 1 g tablet; Take 1 tablet (1 g total) by mouth 4 (four) times daily. - H. pylori antibody, IgG - Lipase - Dexlansoprazole (DEXILANT) 30 MG capsule; Take 1 capsule (30 mg total) by mouth daily.   3. Generalized abdominal pain - CBC with Differential/Platelet - Comprehensive metabolic panel - Urinalysis, Routine w reflex microscopic - sucralfate (CARAFATE) 1 g tablet; Take 1 tablet (1 g total) by mouth 4 (four) times daily. - H. pylori antibody, IgG - Lipase - Dexlansoprazole (DEXILANT) 30 MG capsule; Take 1 capsule (30 mg total) by mouth daily.   PAF (paroxysmal atrial fibrillation) ([place]) Followed by Cardiology. Problem list updated.  Pulmonary fibrosis ([place]), followed by Pulmonolgy Followed by Pulmonology. Problem list updated.  Generalized abdominal pain NEW. Symptoms most c/w gastritis v early PUD v gallbladder etiology. Labs as ordered. Will check FOB. Higher risk in patient on Eliquis and some darker stools. Will consider abdominal imaging based on labs. Medication changed to Dexilant with  Carafate. EKG unchanged. Red flags reviewed for evaluation at ED.   Orders Placed This Encounter  Procedures  . Fecal occult blood, imunochemical  . CBC with Differential/Platelet  . Comprehensive metabolic panel  . Urinalysis, Routine w reflex microscopic  . H. pylori antibody, IgG  . Lipase  . EKG 12-Lead   Meds ordered this encounter  Medications  . sucralfate (CARAFATE) 1 g tablet    Sig: Take 1 tablet (1 g total) by mouth 4 (four) times daily -  with meals and at bedtime.    Dispense:  60 tablet    Refill:  1  . Dexlansoprazole (DEXILANT) 30 MG capsule    Sig: Take 1 capsule (30 mg total) by mouth daily.    Dispense:  30 capsule    Refill:  2   . Reviewed expectations re: course of current medical issues. . Discussed self-management of symptoms. . Outlined signs and symptoms indicating need for more acute intervention. . Patient verbalized understanding and all questions were answered. Marland Kitchen Health Maintenance issues including appropriate healthy diet, exercise, and smoking avoidance were discussed with patient. . See orders for this visit as documented in the electronic medical record. . Patient received an After Visit Summary.  Briscoe Deutscher, DO [place], [place] 10/15/2018  Records requested if needed. Time spent with the patient: 45 minutes, of which >50% was spent in obtaining information about her symptoms, reviewing her previous labs, evaluations, and treatments, counseling her about her condition (please see the discussed topics above), and developing a plan to further investigate it; she had a number of questions which I addressed.

## 2018-10-14 NOTE — Patient Instructions (Addendum)
How to Do a Fecal Occult Blood Test (FOBT)  This information will teach you how to collect stool samples at home for your fecal occult blood test (FOBT). About Your FOBT A FOBT is a lab test used to check your stool (feces) for occult blood. Occult blood is blood that can't be seen just by looking at your stool. There are many reasons you may have blood in your stool. Your doctor or nurse will tell you why you're having the test. For your FOBT, you will collect samples of your stool for 3 days in a row. This increases the chance of finding blood, since the bleeding may not happen every day. If you don't have a bowel movement on one of the 3 days, talk with your doctor or nurse. They will talk with you about collecting the 3 samples over a longer period of time. Preparing for Your FOBT Ask your doctor or nurse how many days before the test you should start to prepare. They may give you special instructions, which you can write in the "Notes" area at the end of this section. It's important to follow the instructions below to be sure your test results are accurate. . Starting 3 days before you begin collecting your stool samples, avoid:  o Red meat, such as beef, lamb, or liver o Raw fruits and vegetables o Vitamin C (ascorbic acid), such as fruit juices containing vitamin C and vitamin C supplements in doses higher than 250 milligrams (mg) per day o Antacids (medications to relieve heartburn or stomach pain, such as Tums) o Medications to stop diarrhea (loose or watery bowel movements) o Iron supplements You can start eating these things again after your FOBT. Marland Kitchen Most people will need to stop taking aspirin, other nonsteroidal anti-inflammatory drugs (NSAIDs), and vitamin E before and during the 3-day collection period. These medications may cause small amounts of blood to appear in your stool.  o Your nurse will give you a resource called Common Medications Containing Aspirin and Other Nonsteroidal  Anti-inflammatory Drugs, which lists common medications that have these products in them. It also lists medications you can take instead. o If you take aspirin to prevent a heart attack or stroke, don't stop taking it unless your doctor instructs you to. . If you have any of the following  during the 3 days you're planning to collect your stool samples, talk with your doctor or nurse. They may tell you to wait to collect your samples.  o Menstrual period o Bleeding hemorrhoids (swollen veins in your anus) o Blood in your urine Collecting the Sample  Figure 1. The Hemoccult side  Supplies You will need the following supplies: . Hemoccult slide . Applicator stick . A clean, dry container . Trash can Procedure 1. Gather your supplies. Place them in the bathroom where you can reach them easily.  o Remove the Hemoccult slide from its paper envelope. Set the envelope aside. o Don't set the Hemoccult slide or applicator stick on the edge of the sink, bath tub, or toilet tank. They shouldn't get wet. o Always keep the Hemoccult slides at room temperature, away from heat and light. 2. Open the large front flap of the Hemoccult slide. You may notice a light blue discoloration on the paper in the squares above boxes A and B. The discoloration won't affect the test. 3. Sit on the toilet like you usually do to pass stool (have a bowel movement).  Use the clean, dry container to catch your  stool before it touches the water in the toilet. 4. Take a sample of your stool with one end of an applicator stick. Apply a thin smear of stool inside the square marked "A" on the Hemoccult slide (see Figure 1). 5. Use the stick to collect a second sample from a different part of your stool. Apply a thin smear of stool inside the square marked "B". 6. Throw out the stick in the wastebasket. 7. Close the cover of the Hemoccult slide and put it back into its paper envelope. Don't put it in anything waterproof, such as a  plastic bag. Store it at room temperature, away from light, children, and pets. 8. Empty the container that holds the stool into the toilet. Flush the remainder of your stool. 9. Wash your hands. Wet your hands with warm water and then rub your hands with soap for at least 15 to 20 seconds. Rinse your hands well under warm running water. Repeat these steps to collect your samples on days 2 and 3. It was very nice to meet you today!  We will get labs and consider abdominal imaging (ultrasound or CT) based on those results.  I changed your stomach medicine to Dexilant and Carafate.  I will get your most recent colonoscopy results for review.  If you have any new or worsening symptoms, please let us know.

## 2018-10-15 ENCOUNTER — Encounter: Payer: Self-pay | Admitting: Family Medicine

## 2018-10-15 ENCOUNTER — Telehealth: Payer: Self-pay | Admitting: *Deleted

## 2018-10-15 DIAGNOSIS — R195 Other fecal abnormalities: Secondary | ICD-10-CM | POA: Insufficient documentation

## 2018-10-15 DIAGNOSIS — I700 Atherosclerosis of aorta: Secondary | ICD-10-CM | POA: Insufficient documentation

## 2018-10-15 DIAGNOSIS — J302 Other seasonal allergic rhinitis: Secondary | ICD-10-CM | POA: Insufficient documentation

## 2018-10-15 DIAGNOSIS — F32A Depression, unspecified: Secondary | ICD-10-CM | POA: Insufficient documentation

## 2018-10-15 DIAGNOSIS — I5189 Other ill-defined heart diseases: Secondary | ICD-10-CM | POA: Insufficient documentation

## 2018-10-15 DIAGNOSIS — F329 Major depressive disorder, single episode, unspecified: Secondary | ICD-10-CM | POA: Insufficient documentation

## 2018-10-15 DIAGNOSIS — R1084 Generalized abdominal pain: Secondary | ICD-10-CM | POA: Insufficient documentation

## 2018-10-15 NOTE — Telephone Encounter (Signed)
Called patient answered all questions in regard to medication. She will call if any problems.

## 2018-10-15 NOTE — Assessment & Plan Note (Signed)
Followed by Pulmonology. Problem list updated.

## 2018-10-15 NOTE — Assessment & Plan Note (Signed)
Followed by Cardiology. Problem list updated.

## 2018-10-15 NOTE — Telephone Encounter (Signed)
Copied from [place] [id number]. Topic: General - Other >> Oct 15, 2018 10:52 AM Keene Breath wrote: Reason for CRM: Patient called to speak with Dr. Juleen China nurse regarding her medications from her visit on 10/14/18.  Patient stated that she had a few questions.  Please advise.  CB# [phone]

## 2018-10-15 NOTE — Assessment & Plan Note (Addendum)
NEW. Symptoms most c/w gastritis v early PUD v gallbladder etiology. Labs as ordered. Will check FOB. Higher risk in patient on Eliquis and some darker stools. Will consider abdominal imaging based on labs. Medication changed to Dexilant with Carafate. EKG unchanged. Red flags reviewed for evaluation at ED.

## 2018-10-19 NOTE — Progress Notes (Signed)
Office: [phone]  /  Fax: [phone]   Date: October 20, 2018   Time Seen: 9:00am Duration: 30 minutes Provider: Glennie Isle, Psy.D. Type of Session: Individual Therapy   HPI: [name] referred by Dr. Brent Bulla to depression with emotional eating behaviors and was seen for an initial appointment by this provider on August 18, 2018. Per the note for the visit withDr. Conrad Sangaree August 04, 2018,"[name] struggling with a lot of boredom eating, she is on Zoloft. [name] struggles withemotional eating and using food for comfort to the extent that it is negatively impactingherhealth. Sheoften snacks when sheis not hungry. [name] feels sheis out of control and then feels guilty that shemade poor food choices. [name] been working on behavior modification techniques to help reduce heremotional eating and has been somewhat successful.Sheshows no sign of suicidal or homicidal ideations." In addition, renda was initially seen at this clinic by [facility] September 17, 2017.During that initial appointment, [name] [name] shared that she started gaining weight two and a halfyears ago and her heaviest weight ever was 274 pounds. [name] [name] reported experiencing the following: significant food craving issues; snacking frequently in the evening; frequently making poor food choices; frequently eating larger portions than normal; binge eating behaviors; and struggling with emotional eating.[name] disclosed she is a picky eater and does not like to eat healthier foods. During the initial appointment with this provider, [name] [name] shared, "[name] thinks I'm depressed and I'm not really sure that is right." She further explained, "Sometimes, I just eat ferociously. I binge." She described experiencing a "preoccupation with food." [name] [name] shared her last binge was the night before the initial appointment with this provider, which consisted of "five or six fig  bars and some popcorn" followed by "two stuffed peppers and two chocolate popsicles" over the course of four hours. She clarified her binge eating behaviors are usually in the evenings, specifically after dinner. [name] described craving sweets.Moreover, [name] [name] indicated binge eating behaviors starting in childhood. She denied a history of purging and engaging in any other compensatory behaviors. She denied a history of a eating disorder diagnosis.Furthermore, [name] asked to complete a questionnaire assessing various behaviors related to emotional eating during the initial appointment with this provider. [name]: overeat when you are celebrating, eat certain foods when you are anxious, stressed, depressed, or your feelings are hurt, use food to help you cope with emotional situations, find food is comforting to you, overeat when you are worried about something, overeat frequently when you are bored or lonely and overeat when you are alone, but eat much less when you are with other people.  Session Content: Session focused on the following treatment goal: decrease emotional eating. The session was initiated with the administration of the PHQ-9 and GAD-7, as well as a brief check-in. Regarding eating, [name] [name] reported things are "not good." She noted recent stressors related to her health, as she was recently informed by her doctor that she may have gallbladder and ulcer issues. As it relates to the meal plan, [name] [name] noted she follows it "sometimes." Nonetheless, she reported that she has been engaging in pleasurable activities since the last appointment with this provider. [name] discussed socializing has helped improve her mood; therefore, a goal to attend church every Sunday until the next appointment with this provider was established. Moreover, psychoeducation regarding triggers for emotional eating was provided. [name] [name] was provided a handout, and encouraged to utilize the handout  between now and the next appointment to increase awareness of triggers and frequency. [name] [name]  agreed. Various behavioral strategies were also discussed for specific triggers for eating. [name] [name] was receptive to today's session as evidenced by her openness to sharing, responsiveness to feedback, and willingness to implement discussed strategies.   Mental Status Examination: [name] [name] arrived on time for the appointment. She presented as appropriately dressed and groomed. [name] [name] appeared her stated age and demonstrated adequate orientation to time, place, person, and purpose of the appointment. She also demonstrated appropriate eye contact. No psychomotor abnormalities or behavioral peculiarities noted. Her mood was euthymic with congruent affect. Her thought processes were logical, linear, and goal-directed. No hallucinations, delusions, bizarre thinking or behavior reported or observed. Judgment, insight, and impulse control appeared to be grossly intact. There was no evidence of paraphasias (i.e., errors in speech, gross mispronunciations, and word substitutions), repetition deficits, or disturbances in volume or prosody (i.e., rhythm and intonation). There was no evidence of attention or memory impairments. [name] [name] denied current suicidal and homicidal ideation, intent or plan.  Structured Assessment Results: The Patient Health Questionnaire-9 (PHQ-9) is a self-report measure that assesses symptoms and severity of depression over the course of the last two weeks. [name] [name] obtained a score of 10 suggesting moderate depression. [name] [name] finds the endorsed symptoms to be not difficult at all. Depression screen PHQ 2/9 10/20/2018  Decreased Interest 1  Down, Depressed, Hopeless 1  PHQ - 2 Score 2  Altered sleeping 0  Tired, decreased energy 3  Change in appetite 3  Feeling bad or failure about yourself  2  Trouble concentrating 0  Moving slowly or fidgety/restless 0  Suicidal thoughts 0  PHQ-9 Score 10    Difficult doing work/chores -  Some recent data might be hidden   The Generalized Anxiety Disorder-7 (GAD-7) is a brief self-report measure that assesses symptoms of anxiety over the course of the last two weeks. Theda obtained a score of 4 suggesting minimal anxiety. GAD 7 : Generalized Anxiety Score 10/20/2018  Nervous, Anxious, on Edge 1  Control/stop worrying 1  Worry too much - different things 1  Trouble relaxing 1  Restless 0  Easily annoyed or irritable 0  Afraid - awful might happen 0  Total GAD 7 Score 4  Anxiety Difficulty Not difficult at all   Interventions: Mairen was administered the PHQ-9 and GAD-7 for symptom monitoring. Content from the last session was reviewed. Throughout today's session, empathic reflections and validation were provided. This provider assisted Tierany in establishing a goal for increasing socialization. Psychoeducation regarding triggers for emotional eating was provided, including behavioral strategies.  DSM-5 Diagnosis: 296.32 (F33.1) Major Depressive Disorder, Recurrent Episode, Moderate, With Anxious Distress, Mild  Treatment Goal & Progress: Maybelle was seen for an initial appointment with this provider on August 18, 2018 during which the following treatment goal was established: decrease emotional eating. Cruz has demonstrated progress in her goal of decreasing emotional eating as evidenced by increased awareness of hunger patterns, and willingness to identify triggers. Erika also demonstrated willingness during today's appointment to implement discussed behavioral strategies.   Plan: Ceirra continues to appear able and willing to participate as evidenced by engagement in reciprocal conversation, and asking questions for clarification as appropriate. The next appointment will be scheduled in two weeks. The next session will focus on reviewing triggers, and working towards the established treatment goal.

## 2018-10-20 ENCOUNTER — Encounter (INDEPENDENT_AMBULATORY_CARE_PROVIDER_SITE_OTHER): Payer: Self-pay | Admitting: Family Medicine

## 2018-10-20 ENCOUNTER — Ambulatory Visit (INDEPENDENT_AMBULATORY_CARE_PROVIDER_SITE_OTHER): Payer: Medicare Other | Admitting: Family Medicine

## 2018-10-20 ENCOUNTER — Ambulatory Visit (INDEPENDENT_AMBULATORY_CARE_PROVIDER_SITE_OTHER): Payer: Medicare Other | Admitting: Psychology

## 2018-10-20 VITALS — BP 106/67 | HR 74 | Temp 97.9°F | Ht 66.0 in | Wt 245.0 lb

## 2018-10-20 DIAGNOSIS — F3289 Other specified depressive episodes: Secondary | ICD-10-CM

## 2018-10-20 DIAGNOSIS — Z6839 Body mass index (BMI) 39.0-39.9, adult: Secondary | ICD-10-CM

## 2018-10-20 DIAGNOSIS — F331 Major depressive disorder, recurrent, moderate: Secondary | ICD-10-CM

## 2018-10-21 IMAGING — DX DG CHEST 2V
2 series · 2 of 2 positions shown · non-contrast
Comparison: PA and lateral chest x-ray September 02, 2016

CLINICAL DATA: Three days of dyspnea. History atrial fibrillation,
mitral valve prolapse, and pacemaker placement. Former smoker.

EXAM:
CHEST  2 VIEW

[chest pa]
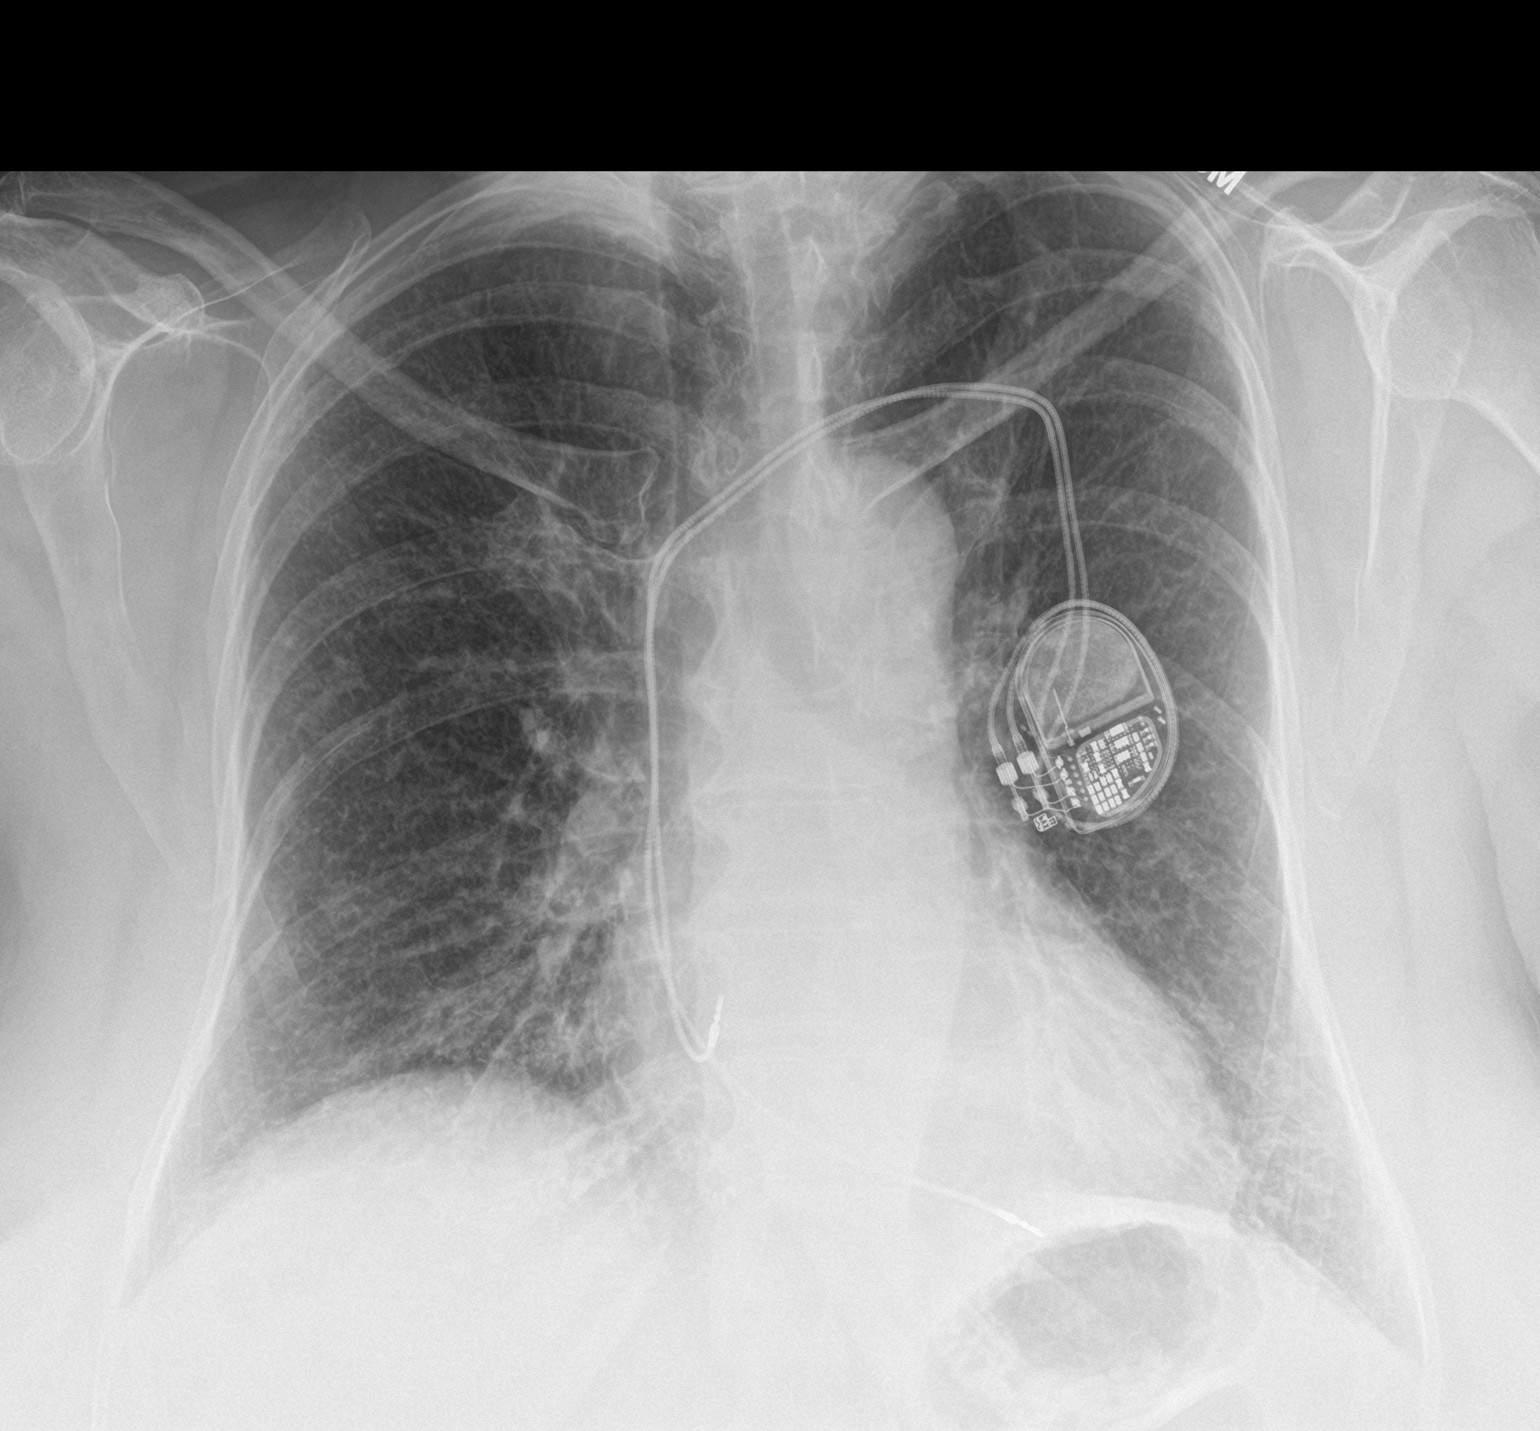

[chest lat]
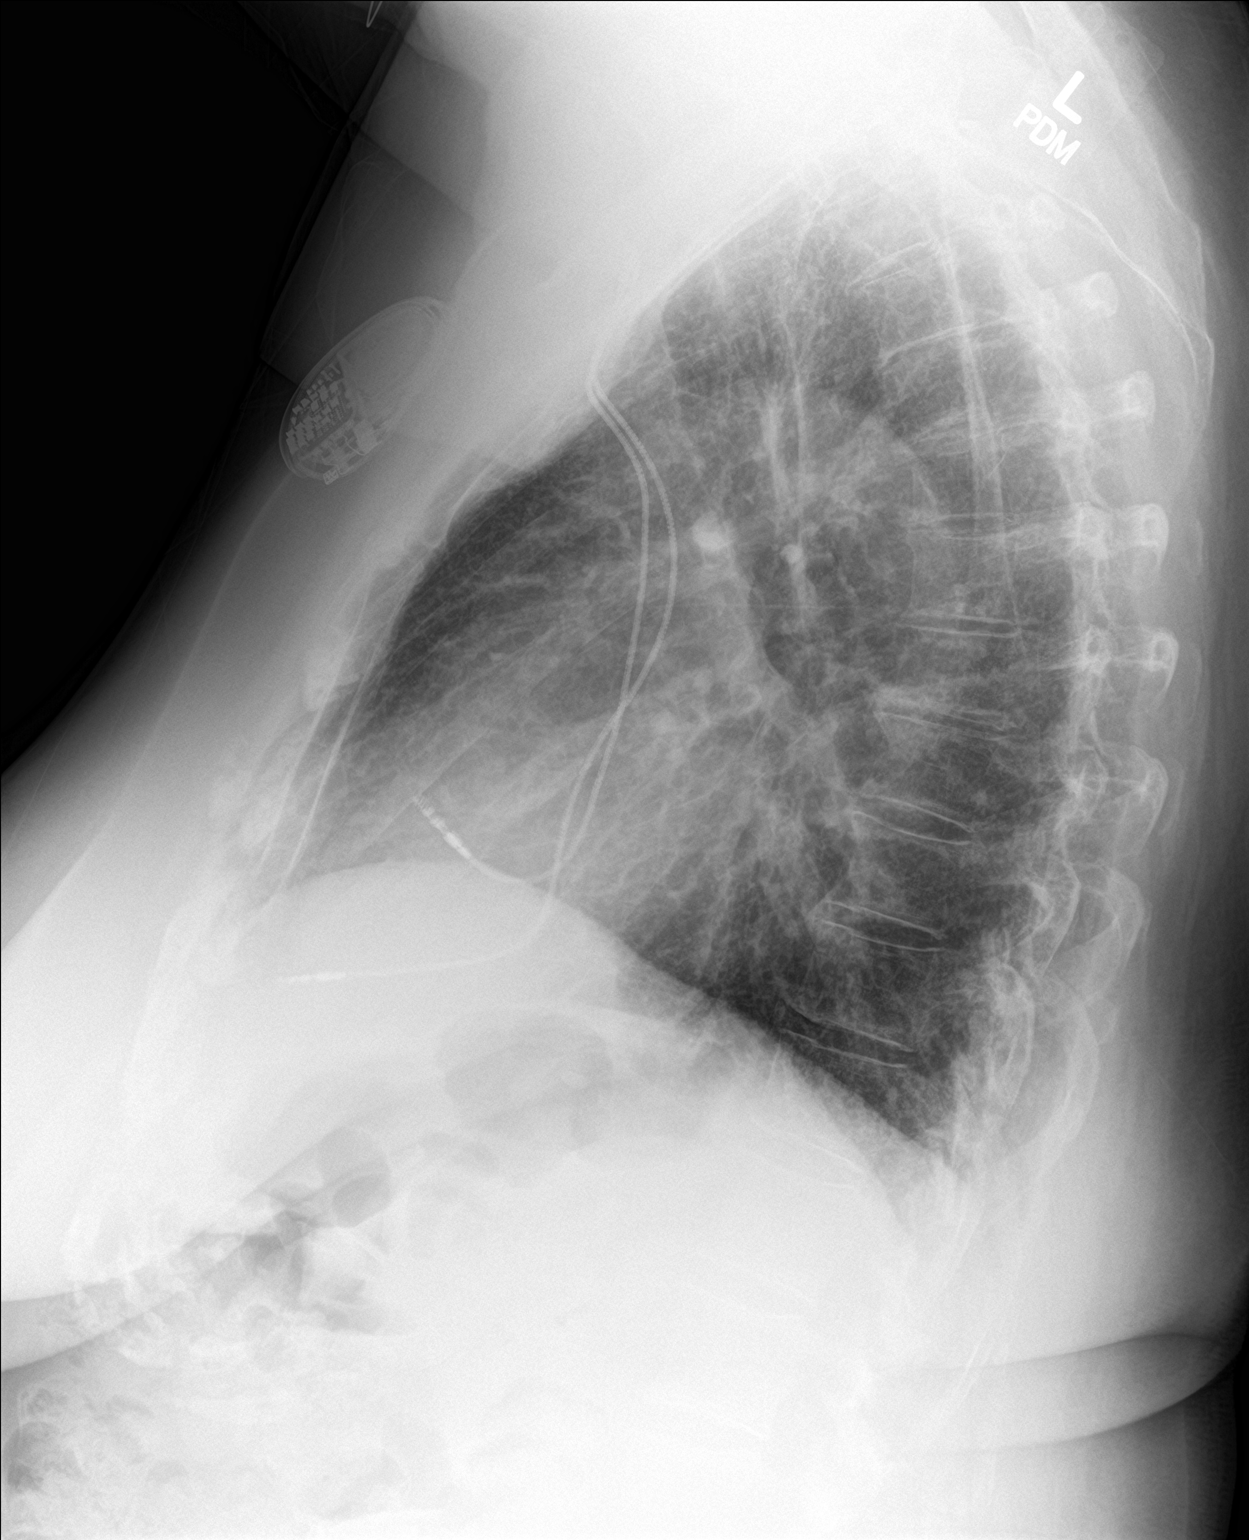

[2 of 2 positions shown; findings below may reference images not displayed]

FINDINGS: The lungs are adequately inflated. The interstitial markings are
coarse though stable. There is no alveolar infiltrate or pleural
effusion. The heart and pulmonary vascularity are normal. There is
calcification in the wall of the thoracic aorta. The ICD is in
stable position. The bony thorax exhibits no acute abnormality.
IMPRESSION: Chronic bronchitic changes. No pneumonia, CHF, nor other acute
cardiopulmonary abnormality.

## 2018-10-22 ENCOUNTER — Telehealth: Payer: Self-pay | Admitting: Cardiovascular Disease

## 2018-10-22 NOTE — Progress Notes (Signed)
Office: [phone]  /  Fax: [phone]   HPI:   Chief Complaint: OBESITY [name] [name] is here to discuss her progress with her obesity treatment plan. She is on the portion control better and make smarter food choices, such as increase vegetables and decrease simple carbohydrates and is following her eating plan approximately 30% of the time. She states she is doing POD and walking for 30 minutes 2 times per week. [name] [name] admits to eating too many sweets.  She plans on going out to eat on Thanksgiving.  Her weight is 245 lb (111.1 kg) today and has gained 1 pound since her last visit. She has lost 22 lbs since starting treatment with [place].  Depression with emotional eating behaviors [name] [name] saw Dr. Mallie Mussel today. She is struggling with the anniversary of her daughter's death 1 year ago. [name] [name] is struggling a great deal with emotional eating and using food for comfort to the extent that it is negatively impacting her health. She often snacks when she is not hungry. [name] [name] sometimes feels she is out of control and then feels guilty that she made poor food choices. She has been working on behavior modification techniques to help reduce her emotional eating and has been somewhat successful. She shows no sign of suicidal or homicidal ideations. She reports sleeping well.   Depression screen [facility] 2/9 10/20/2018 09/16/2018 08/18/2018 05/07/2018 05/07/2018  Decreased Interest 1 0 1 0 0  Down, Depressed, Hopeless _0 0 0  PHQ - 2 Score _1 0 0  Altered sleeping 0 1 1 0 0  Tired, decreased energy _2 0 0  Change in appetite _3 0 0  Feeling bad or failure about yourself  _4 0 0  Trouble concentrating 0 0 1 0 0  Moving slowly or fidgety/restless 0 0 0 0 0  Suicidal thoughts 0 0 0 0 0  PHQ-9 Score _5 0 0  Difficult doing work/chores - - - - Not difficult at all  Some recent data might be hidden    ALLERGIES: Allergies  Allergen Reactions  . Ancef [Cefazolin] Hives and Itching  .  Pseudoephedrine Hcl     Other reaction(s): palpitations (moderate)  . Ergotamine   . Pentobarbital   . Caffeine Palpitations    MEDICATIONS: Current Outpatient Medications on File Prior to Visit  Medication Sig Dispense Refill  . apixaban (ELIQUIS) 5 MG TABS tablet Take 1 tablet (5 mg total) by mouth 2 (two) times daily. 180 tablet 1  . Biotin 5000 MCG CAPS Take 5,000 mcg by mouth daily.     . Calcium Carbonate-Vit D-Min (CALCIUM 1200 PO) Take 2,400 mg by mouth daily.     . cholecalciferol (VITAMIN D) 1000 UNITS tablet Take 1,000 Units by mouth daily.     Marland Kitchen Co-Enzyme Q10 100 MG CAPS Take 100 mg by mouth daily.     Marland Kitchen Dexlansoprazole (DEXILANT) 30 MG capsule Take 1 capsule (30 mg total) by mouth daily. 30 capsule 2  . diazepam (VALIUM) 5 MG tablet TAKE 1 TABLET BY MOUTH ONCE DAILY AS NEEDED FOR ANXIETY 30 tablet 1  . diclofenac sodium (VOLTAREN) 1 % GEL Apply 2 g topically 4 (four) times daily as needed. 200 g 3  . fluticasone (FLONASE) 50 MCG/ACT nasal spray Place 2 sprays into both nostrils daily. 16 g 6  . folic acid (FOLVITE) 283 MCG tablet Take 400 mcg by mouth daily.    Marland Kitchen glucosamine-chondroitin 500-400 MG  tablet Take 1 tablet by mouth daily.     . Glycopyrrolate-Formoterol (BEVESPI AEROSPHERE) 9-4.8 MCG/ACT AERO Inhale 2 puffs into the lungs 2 (two) times daily. 1 Inhaler 0  . loratadine (KLS ALLERCLEAR) 10 MG tablet Take 10 mg by mouth daily.    Marland Kitchen MAGNESIUM CITRATE PO Take 250 mg by mouth daily. 1 tab daily    . nitroGLYCERIN (NITROSTAT) 0.4 MG SL tablet Place 0.4 mg under the tongue every 5 (five) minutes as needed for chest pain.    Marland Kitchen OVER THE COUNTER MEDICATION CBD oil    . Resveratrol 250 MG CAPS Take 250 mg by mouth daily.    . sertraline (ZOLOFT) 100 MG tablet TAKE 1 TABLET BY MOUTH ONCE DAILY 90 tablet 0  . Simethicone (GAS-X PO) Take 2 tablets by mouth daily as needed (bloating).    . Spacer/Aero-Holding Chambers (AEROCHAMBER MV) inhaler Use as instructed 1 each 0  .  sucralfate (CARAFATE) 1 g tablet Take 1 tablet (1 g total) by mouth 4 (four) times daily -  with meals and at bedtime. 60 tablet 1  . Triamcinolone Acetonide ([employer] NASAL ALLERGY SPRAY NA) Place into the nose.     No current facility-administered medications on file prior to visit.     PAST MEDICAL HISTORY: Past Medical History:  Diagnosis Date  . Alpha-1-antitrypsin deficiency carrier   . Arthritis    "all over"  . Atrial fibrillation ([place])   . Back pain   . Complication of anesthesia    "I woke up during 2 different procedures" (10/04/2014)  . Frequent UTI   . GERD (gastroesophageal reflux disease)   . Hypertension   . MVP (mitral valve prolapse)   . Obesity   . Osteoarthritis   . Presence of permanent cardiac pacemaker   . PVC's (premature ventricular contractions)   . Shortness of breath   . 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 Sleep apnea   . Stroke [facility]) 01/2014   denies deficits on 10/04/2014  . Tachy-brady syndrome ([place]) 10/2014  . TIA (transient ischemic attack)     PAST SURGICAL HISTORY: Past Surgical History:  Procedure Laterality Date  . CARDIOVERSION N/A 09/02/2017   Procedure: CARDIOVERSION;  Surgeon: Sanda Klein, MD;  Location: [facility] ENDOSCOPY;  Service: Cardiovascular;  Laterality: N/A;  . EXCISION VAGINAL CYST     benign nodules  . INSERT / REPLACE / REMOVE PACEMAKER  10/04/2014   Medtronic Advisa model [id number] serial number [id number]  . LOOP RECORDER EXPLANT N/A 10/04/2014   Procedure: LOOP RECORDER EXPLANT;  Surgeon: Sanda Klein, MD;  Location: [place] CATH LAB;  Service: Cardiovascular;  Laterality: N/A;  . LOOP RECORDER IMPLANT N/A 04/19/2014   Procedure: LOOP RECORDER IMPLANT;  Surgeon: Sanda Klein, MD;  Location: [place] CATH LAB;  Service: Cardiovascular;  Laterality: N/A;  . NM MYOCAR PERF WALL MOTION  12/18/2007   normal  . PERMANENT PACEMAKER INSERTION N/A 10/04/2014   Procedure: PERMANENT PACEMAKER  INSERTION;  Surgeon: Sanda Klein, MD;  Location: [place] CATH LAB;  Service: Cardiovascular;  Laterality: N/A;  . TUBAL LIGATION    . US ECHOCARDIOGRAPHY  05/15/2010   LA mildly dilated,mild mitral annular ca+, AOV mildly sclerotic, mild asymmetric LVH    SOCIAL HISTORY: Social History   Tobacco Use  . Smoking status: Former Smoker    Packs/day: 2.00    Years: 18.00    Pack years: 36.00    Types: Cigarettes  . Smokeless tobacco: Never  Used  . Tobacco comment: "quit smoking in 1971"  Substance Use Topics  . Alcohol use: Yes    Alcohol/week: 0.0 standard drinks    Comment: 10/04/2014 "might have a drink a couple times/yr"  . Drug use: No    FAMILY HISTORY: Family History  Problem Relation Age of Onset  . Heart attack Mother   . Sudden death Mother   . Depression Mother   . Stroke Father   . Heart failure Father   . Depression Father   . Heart failure Brother   . Stroke Brother   . Alpha-1 antitrypsin deficiency Daughter     ROS: Review of Systems  Constitutional: Negative for weight loss.  Psychiatric/Behavioral: Positive for depression. Negative for suicidal ideas. The patient does not have insomnia.     PHYSICAL EXAM: Blood pressure 106/67, pulse 74, temperature 97.9 F (36.6 C), temperature source Oral, height _0  (1.676 m), weight 245 lb (111.1 kg), SpO2 95 %. Body mass index is 39.54 kg/m. Physical Exam  Constitutional: She is oriented to person, place, and time. She appears well-developed and well-nourished.  Cardiovascular: Normal rate.  Pulmonary/Chest: Effort normal.  Musculoskeletal: Normal range of motion.  Neurological: She is oriented to person, place, and time.  Skin: Skin is warm and dry.  Psychiatric: She has a normal mood and affect. Her behavior is normal.  Vitals reviewed.   RECENT LABS AND TESTS: BMET    Component Value Date/Time   NA 137 10/14/2018 1028   NA 142 09/22/2018 1242   K 4.4 10/14/2018 1028   CL 104 10/14/2018 1028   CO2  30 10/14/2018 1028   GLUCOSE 111 (H) 10/14/2018 1028   BUN 29 (H) 10/14/2018 1028   BUN 19 09/22/2018 1242   CREATININE 0.99 10/14/2018 1028   CREATININE 0.86 09/28/2014 1557   CALCIUM 9.0 10/14/2018 1028   GFRNONAA 55 (L) 09/22/2018 1242   GFRNONAA 56 (L) 06/06/2014 1653   GFRAA 64 09/22/2018 1242   GFRAA 65 06/06/2014 1653   Lab Results  Component Value Date   HGBA1C 5.8 (H) 09/22/2018   HGBA1C 5.8 (H) 06/03/2018   HGBA1C 5.8 (H) 12/24/2017   HGBA1C 6.2 (H) 09/17/2017   HGBA1C 5.9 (H) 03/09/2014   Lab Results  Component Value Date   INSULIN 24.2 09/22/2018   INSULIN 17.6 06/03/2018   INSULIN 19.4 12/24/2017   INSULIN 47.7 (H) 09/17/2017   CBC    Component Value Date/Time   WBC 7.6 10/14/2018 1028   RBC 4.86 10/14/2018 1028   HGB 14.2 10/14/2018 1028   HGB 14.9 09/17/2017 1131   HCT 42.2 10/14/2018 1028   HCT 45.1 09/17/2017 1131   PLT 258.0 10/14/2018 1028   PLT 258 08/27/2017 1509   MCV 86.8 10/14/2018 1028   MCV 86 09/17/2017 1131   MCH 28.5 09/17/2017 1131   MCH 29.0 09/28/2014 1557   MCHC 33.6 10/14/2018 1028   RDW 12.9 10/14/2018 1028   RDW 13.5 09/17/2017 1131   LYMPHSABS 2.0 10/14/2018 1028   LYMPHSABS 2.0 09/17/2017 1131   MONOABS 1.0 10/14/2018 1028   EOSABS 0.2 10/14/2018 1028   EOSABS 0.2 09/17/2017 1131   BASOSABS 0.1 10/14/2018 1028   BASOSABS 0.0 09/17/2017 1131   Iron/TIBC/Ferritin/ %Sat No results found for: IRON, TIBC, FERRITIN, IRONPCTSAT Lipid Panel     Component Value Date/Time   CHOL 176 06/03/2018 1022   TRIG 113 06/03/2018 1022   HDL 53 06/03/2018 1022   CHOLHDL 3 01/15/2017 1417   VLDL  33.6 01/15/2017 1417   LDLCALC 100 (H) 06/03/2018 1022   LDLDIRECT 81.0 07/15/2016 1419   Hepatic Function Panel     Component Value Date/Time   PROT 7.2 10/14/2018 1028   PROT 6.5 09/22/2018 1242   ALBUMIN 3.9 10/14/2018 1028   ALBUMIN 4.0 09/22/2018 1242   AST 19 10/14/2018 1028   ALT 13 10/14/2018 1028   ALKPHOS 58 10/14/2018 1028     BILITOT 0.3 10/14/2018 1028   BILITOT 0.4 09/22/2018 1242   BILIDIR 0.1 01/15/2017 1417      Component Value Date/Time   TSH 2.620 09/22/2018 1242   TSH 2.040 09/17/2017 1131   TSH 2.70 07/15/2016 1419    ASSESSMENT AND PLAN: Other depression - with emotional eating   Class 2 severe obesity with serious comorbidity and body mass index (BMI) of 39.0 to 39.9 in adult, unspecified obesity type (HCC)  PLAN:  Depression with Emotional Eating Behaviors We discussed behavior modification techniques today to help [name] [name] deal with her emotional eating and depression. Lacole agrees to continue taking Zoloft per her primary care physician and she agrees to follow up with Dr. Mallie Mussel in 2 weeks.   Obesity [name] [name] is currently in the action stage of change. As such, her goal is to continue with weight loss efforts She has agreed to portion control better and make smarter food choices, such as increase vegetables and decrease simple carbohydrates  [name] [name] has been instructed to work up to a goal of 150 minutes of combined cardio and strengthening exercise per week or as above for weight loss and overall health benefits. We discussed the following Behavioral Modification Strategies today: holiday eating strategies, keeping healthy foods in the home, and planning for success [name] [name] is to reduce sweets and junk food in the home.  [name] [name] has agreed to follow up with our clinic in 2 weeks. She was informed of the importance of frequent follow up visits to maximize her success with intensive lifestyle modifications for her multiple health conditions.   OBESITY BEHAVIORAL INTERVENTION VISIT  Today's visit was # 23  Starting weight: 267 lbs Starting date: 09/17/17 Today's weight : 245 lbs Today's date: 10/20/2018 Total lbs lost to date: 22 At least 15 minutes were spent on discussing the following behavioral intervention visit.    ASK: We discussed the diagnosis of obesity with [name] [name]  today and Earnesteen agreed to give [place] permission to discuss obesity behavioral modification therapy today.  ASSESS: [name] [name] has the diagnosis of obesity and her BMI today is 39.56 [name] [name] is in the action stage of change   ADVISE: [name] [name] was educated on the multiple health risks of obesity as well as the benefit of weight loss to improve her health. She was advised of the need for long term treatment and the importance of lifestyle modifications to improve her current health and to decrease her risk of future health problems.  AGREE: Multiple dietary modification options and treatment options were discussed and  [name] [name] agreed to follow the recommendations documented in the above note.  ARRANGE: [name] [name] was educated on the importance of frequent visits to treat obesity as outlined per CMS and USPSTF guidelines and agreed to schedule her next follow up appointment today.  [name] [name], am acting as Location manager for [employer], FNP-C.  I have reviewed the above documentation for accuracy and completeness, and I agree with the above.  - [name] Asante, FNP-C.

## 2018-10-22 NOTE — Telephone Encounter (Signed)
New Message   Patient calling the office for samples of medication:   1.  What medication and dosage are you requesting samples for? Eliquis  2.  Are you currently out of this medication? One day remaining

## 2018-10-22 NOTE — Telephone Encounter (Signed)
Left message that samples are available for pick up at the front desk.  Eliquis 5 mg Qty: 2 boxes Lot # [id number] Exp: 11/21

## 2018-10-26 ENCOUNTER — Encounter (INDEPENDENT_AMBULATORY_CARE_PROVIDER_SITE_OTHER): Payer: Self-pay | Admitting: Family Medicine

## 2018-10-27 ENCOUNTER — Telehealth: Payer: Self-pay | Admitting: Family Medicine

## 2018-10-27 NOTE — Telephone Encounter (Signed)
See note  Copied from [place] [phone]. Topic: General - Other >> Oct 27, 2018  1:20 PM Rayann Heman wrote: Reason for CRM: pt called and stated that she had lab done 10/14/18 and has not received lab results. Please advise

## 2018-10-27 NOTE — Telephone Encounter (Signed)
Called patient reviewed results no questions.

## 2018-11-04 NOTE — Progress Notes (Signed)
Office: [phone]  /  Fax: [phone]   Date: November 05, 2018 Time Seen: 9:32am Duration: 30 minutes Provider: Glennie Isle, Psy.D. Type of Session: Individual Therapy   HPI: [name] referred by Dr. Brent Bulla to depression with emotional eating behaviorsand was seen for an initial appointment by this provider on August 18, 2018. Per the note for the visit withDr. Conrad Windham August 04, 2018,"[name] struggling with [name] lot of boredom eating, she is on Zoloft. Zoeie struggles withemotional eating and using food for comfort to the extent that it is negatively impactingherhealth. Sheoften snacks when sheis not hungry. [name] feels sheis out of control and then feels guilty that shemade poor food choices. [name] [name] been working on behavior modification techniques to help reduce heremotional eating and has been somewhat successful.Sheshows no sign of suicidal or homicidal ideations."In addition,renda was initially seen at this clinic by [facility] September 17, 2017.During that initial appointment,[name] that she started gaining weight two and [name] halfyears ago and her heaviest weight ever was 274 pounds. [name] reported experiencing the following: significant food craving issues; snacking frequently in the evening; frequently making poor food choices; frequently eating larger portions than normal; binge eating behaviors; and struggling with emotional eating.[name] disclosed she is [name] picky eater and does not like to eat healthier foods.During the initial appointment with this provider,[name] shared, "[name] thinks I'm depressed and I'm not really sure that is right." She further explained, "Sometimes, I just eat ferociously. I binge." She described experiencing [name] "preoccupation with food." [name] shared her last binge wasthe night before the initial appointment with this provider, which consisted of "five or six fig  bars and some popcorn" followed by "two stuffed peppers and two chocolate popsicles" over the course of four hours. She clarified her binge eating behaviors are usually in the evenings, specifically after dinner. [name] [name] described craving sweets.Moreover,[name] [name] indicated binge eating behaviors starting in childhood. She denied [name] history of purging and engaging in any other compensatory behaviors. She denied [name] history of [name] eating disorder diagnosis.Furthermore,[name] asked to complete [name] questionnaire assessing various behaviors related to emotional eatingduring the initial appointment with this provider. [name]: overeat when you are celebrating, eat certain foods when you are anxious, stressed, depressed, or your feelings are hurt, use food to help you cope with emotional situations, find food is comforting to you, overeat when you are worried about something, overeat frequently when you are bored or lonely and overeat when you are alone, but eat much less when you are with other people.  Session Content: Session focused on the following treatment goal: decrease emotional eating. The session was initiated with the administration of the PHQ-9 and GAD-7, as well as [name] brief check-in. [name] [name] shared she was recently diagnosed with an ulcer. As such, this provider recommended she inform [name] [name], [name] [name] during their appointment today.  [name] [name] agreed. She also discussed ongoing worry related to her health; therefore, this provider assisted [name] [name] in processing associated thoughts and feelings. She noted she continues to engage in pleasurable activities. Regarding eating, she noted an increase in her protein intake; however, she reported, "I want what I want when I want." As such, psychoeducation regarding the connection between thoughts, feelings, and behaviors was provided. [name] [name] was provided [name] handout explaining the aforementioned. Additionally, [name] core belief handout was utilized to  engage in cognitive restructuring. [name] [name] was receptive to today's session as evidenced by openness to sharing, responsiveness to feedback, and willingness to engage in thought challenging between now and the  next appointment as appropriate.  Mental Status Examination: [name] [name] arrived on time for the appointment; however, the appointment was initiated late due to this provider. She presented as appropriately dressed and groomed. [name] [name] appeared her stated age and demonstrated adequate orientation to time, place, person, and purpose of the appointment. She also demonstrated appropriate eye contact. No psychomotor abnormalities or behavioral peculiarities noted. Her mood was euthymic with congruent affect. Her thought processes were logical, linear, and goal-directed. No hallucinations, delusions, bizarre thinking or behavior reported or observed. Judgment, insight, and impulse control appeared to be grossly intact. There was no evidence of paraphasias (i.e., errors in speech, gross mispronunciations, and word substitutions), repetition deficits, or disturbances in volume or prosody (i.e., rhythm and intonation). There was no evidence of attention or memory impairments. [name] [name] denied current suicidal and homicidal ideation, intent or plan.  Structured Assessment Results: The Patient Health Questionnaire-9 (PHQ-9) is [name] self-report measure that assesses symptoms and severity of depression over the course of the last two weeks. [name] [name] obtained [name] score of 12 suggesting moderate depression. [name] [name] finds the endorsed symptoms to be somewhat difficult. Depression screen PHQ 2/9 11/05/2018  Decreased Interest 1  Down, Depressed, Hopeless 2  PHQ - 2 Score 3  Altered sleeping 0  Tired, decreased energy 3  Change in appetite 3  Feeling bad or failure about yourself  3  Trouble concentrating 0  Moving slowly or fidgety/restless 0  Suicidal thoughts 0  PHQ-9 Score 12  Difficult doing work/chores -  Some recent data  might be hidden   The Generalized Anxiety Disorder-7 (GAD-7) is [name] brief self-report measure that assesses symptoms of anxiety over the course of the last two weeks. [name] [name] obtained [name] score of 4 suggesting minimal anxiety. GAD 7 : Generalized Anxiety Score 11/05/2018  Nervous, Anxious, on Edge 1  Control/stop worrying 1  Worry too much - different things 1  Trouble relaxing 1  Restless 0  Easily annoyed or irritable 0  Afraid - awful might happen 0  Total GAD 7 Score 4  Anxiety Difficulty Somewhat difficult   Interventions: [name] [name] was administered the PHQ-9 and GAD-7 for symptom monitoring. Content from the last session was reviewed. Throughout today's session, empathic reflections and validation were provided. Psychoeducation regarding the connection between thoughts, feelings, and behaviors as well and core beliefs was provided. [name] core belief of [name] [name]'s was explored.  DSM-5 Diagnosis: 296.32 (F33.1) Major Depressive Disorder, Recurrent Episode, Moderate, With Anxious Distress, Mild  Treatment Goal & Progress: [name] [name] was seen for an initial appointment with this provider on August 18, 2018 during which the following treatment goal was established: decrease emotional eating. [name] [name] has demonstrated progress in her goal as evidenced by increased awareness of hunger patterns and triggers for emotional eating. In addition, she continues to demonstrate willingness to engage in learned skills.  Plan: [name] [name] continues to appear able and willing to participate as evidenced by engagement in reciprocal conversation, and asking questions for clarification as appropriate. The next appointment will be scheduled in one month due to the upcoming holidays. [name] [name] verbally expressed understanding that she may call this provider's office to request an appointment sooner if needed. The next session will focus on reviewing learned skills, and working towards the established treatment goal.

## 2018-11-05 ENCOUNTER — Ambulatory Visit (INDEPENDENT_AMBULATORY_CARE_PROVIDER_SITE_OTHER): Payer: Medicare Other | Admitting: Family Medicine

## 2018-11-05 ENCOUNTER — Ambulatory Visit (INDEPENDENT_AMBULATORY_CARE_PROVIDER_SITE_OTHER): Payer: Medicare Other | Admitting: Psychology

## 2018-11-05 ENCOUNTER — Encounter (INDEPENDENT_AMBULATORY_CARE_PROVIDER_SITE_OTHER): Payer: Self-pay | Admitting: Family Medicine

## 2018-11-05 VITALS — BP 111/73 | HR 82 | Temp 97.9°F | Ht 66.0 in | Wt 247.0 lb

## 2018-11-05 DIAGNOSIS — F331 Major depressive disorder, recurrent, moderate: Secondary | ICD-10-CM | POA: Diagnosis not present

## 2018-11-05 DIAGNOSIS — Z6839 Body mass index (BMI) 39.0-39.9, adult: Secondary | ICD-10-CM | POA: Diagnosis not present

## 2018-11-05 DIAGNOSIS — F3289 Other specified depressive episodes: Secondary | ICD-10-CM

## 2018-11-06 IMAGING — NM NM MISC PROCEDURE
4 series · 24 of 24 positions shown · non-contrast
Comparison: none

[Series 1: — · 6.40mm/px · 6 of 64 frames shown]
[frame 6/64]
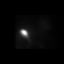
[frame 16/64]
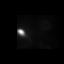
[frame 27/64]
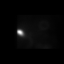
[frame 38/64]
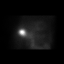
[frame 48/64]
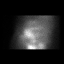
[frame 59/64]
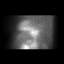

[Series 1: wbr_s-proj_st wbr stress ng · 6.40mm/px · 6 of 64 frames shown]
[frame 6/64]
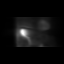
[frame 16/64]
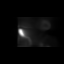
[frame 27/64]
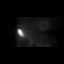
[frame 38/64]
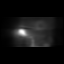
[frame 48/64]
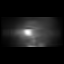
[frame 59/64]
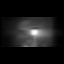

[Series 2: wbr rest · 6.40mm/px · 6 of 64 frames shown]
[frame 6/64]
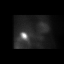
[frame 16/64]
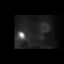
[frame 27/64]
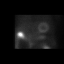
[frame 38/64]
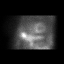
[frame 48/64]
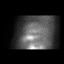
[frame 59/64]
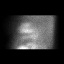

[Series 2: wbr_r-proj_st wbr rest · 6.40mm/px · 6 of 64 frames shown]
[frame 6/64]
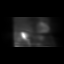
[frame 16/64]
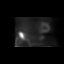
[frame 27/64]
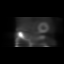
[frame 38/64]
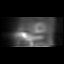
[frame 48/64]
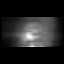
[frame 59/64]
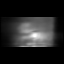

[24 of 24 positions shown; findings below may reference images not displayed]

Canned report from images found in remote index.

Refer to host system for actual result text.

## 2018-11-09 ENCOUNTER — Encounter (INDEPENDENT_AMBULATORY_CARE_PROVIDER_SITE_OTHER): Payer: Self-pay | Admitting: Family Medicine

## 2018-11-09 ENCOUNTER — Telehealth: Payer: Self-pay | Admitting: Cardiovascular Disease

## 2018-11-09 NOTE — Telephone Encounter (Signed)
° ° °  Patient calling the office for samples of medication:   1.  What medication and dosage are you requesting samples for? Eliquis  2.  Are you currently out of this medication? yes

## 2018-11-09 NOTE — Telephone Encounter (Signed)
Called patient, advised we did not have any samples in office. Advised patient to call pharmacy to see if there were any cheaper options like a 2 week supply. Patient verbalized understanding.

## 2018-11-09 NOTE — Progress Notes (Signed)
Office: [phone]  /  Fax: [phone]   HPI:   Chief Complaint: OBESITY [name] [name] is here to discuss her progress with her obesity treatment plan. She is on the  portion control better and make smarter food choices, such as increase vegetables and decrease simple carbohydrates  and is following her eating plan approximately 30 % of the time. She states she is exercising 0 minutes 0 times per week. [name] [name] was recently diagnosed with an ulcer. She is taking dexilant and carafate. The pain is improving. She is still planning on moving to [place], which will be a positive move for her.  Her weight is 247 lb (112 kg) today and has had a weight gain of 2 pounds over a period of 2 weeks since her last visit. She has lost 20 lbs since starting treatment with [place].  Depression with emotional eating behaviors [name] [name] is struggling with emotional eating and using food for comfort to the extent that it is negatively impacting her health. She often snacks when she is not hungry. [name] [name] sometimes feels she is out of control and then feels guilty that she made poor food choices. She has been working on behavior modification techniques to help reduce her emotional eating and has been somewhat successful. She saw Dr. Mallie Mussel today and feels it is helping. She does feel like a failure for lack of weight loss.  She shows no sign of suicidal or homicidal ideations.  Depression screen [name] 2/9 11/05/2018 10/20/2018 09/16/2018 08/18/2018 05/07/2018  Decreased Interest 1 1 0 1 0  Down, Depressed, Hopeless _0 0  PHQ - 2 Score _1 0  Altered sleeping 0 0 1 1 0  Tired, decreased energy _2 0  Change in appetite _3 0  Feeling bad or failure about yourself  _4 0  Trouble concentrating 0 0 0 1 0  Moving slowly or fidgety/restless 0 0 0 0 0  Suicidal thoughts 0 0 0 0 0  PHQ-9 Score _5 0  Difficult doing work/chores - - - - -  Some recent data might be hidden    ALLERGIES: Allergies    Allergen Reactions  . Ancef [Cefazolin] Hives and Itching  . Pseudoephedrine Hcl     Other reaction(s): palpitations (moderate)  . Ergotamine   . Pentobarbital   . Caffeine Palpitations    MEDICATIONS: Current Outpatient Medications on File Prior to Visit  Medication Sig Dispense Refill  . apixaban (ELIQUIS) 5 MG TABS tablet Take 1 tablet (5 mg total) by mouth 2 (two) times daily. 180 tablet 1  . Biotin 5000 MCG CAPS Take 5,000 mcg by mouth daily.     . Calcium Carbonate-Vit D-Min (CALCIUM 1200 PO) Take 2,400 mg by mouth daily.     . cholecalciferol (VITAMIN D) 1000 UNITS tablet Take 1,000 Units by mouth daily.     Marland Kitchen Co-Enzyme Q10 100 MG CAPS Take 100 mg by mouth daily.     Marland Kitchen Dexlansoprazole (DEXILANT) 30 MG capsule Take 1 capsule (30 mg total) by mouth daily. 30 capsule 2  . diazepam (VALIUM) 5 MG tablet TAKE 1 TABLET BY MOUTH ONCE DAILY AS NEEDED FOR ANXIETY 30 tablet 1  . diclofenac sodium (VOLTAREN) 1 % GEL Apply 2 g topically 4 (four) times daily as needed. 200 g 3  . fluticasone (FLONASE) 50 MCG/ACT nasal spray Place 2 sprays into both nostrils daily. 16 g 6  .  folic acid (FOLVITE) 779 MCG tablet Take 400 mcg by mouth daily.    Marland Kitchen glucosamine-chondroitin 500-400 MG tablet Take 1 tablet by mouth daily.     . Glycopyrrolate-Formoterol (BEVESPI AEROSPHERE) 9-4.8 MCG/ACT AERO Inhale 2 puffs into the lungs 2 (two) times daily. 1 Inhaler 0  . loratadine (KLS ALLERCLEAR) 10 MG tablet Take 10 mg by mouth daily.    Marland Kitchen MAGNESIUM CITRATE PO Take 250 mg by mouth daily. 1 tab daily    . nitroGLYCERIN (NITROSTAT) 0.4 MG SL tablet Place 0.4 mg under the tongue every 5 (five) minutes as needed for chest pain.    Marland Kitchen OVER THE COUNTER MEDICATION CBD oil    . Resveratrol 250 MG CAPS Take 250 mg by mouth daily.    . sertraline (ZOLOFT) 100 MG tablet TAKE 1 TABLET BY MOUTH ONCE DAILY 90 tablet 0  . Simethicone (GAS-X PO) Take 2 tablets by mouth daily as needed (bloating).    . Spacer/Aero-Holding  Chambers (AEROCHAMBER MV) inhaler Use as instructed 1 each 0  . sucralfate (CARAFATE) 1 g tablet Take 1 tablet (1 g total) by mouth 4 (four) times daily -  with meals and at bedtime. 60 tablet 1  . Triamcinolone Acetonide ([employer] NASAL ALLERGY SPRAY NA) Place into the nose.     No current facility-administered medications on file prior to visit.     PAST MEDICAL HISTORY: Past Medical History:  Diagnosis Date  . Alpha-1-antitrypsin deficiency carrier   . Arthritis    "all over"  . Atrial fibrillation ([place])   . Back pain   . Complication of anesthesia    "I woke up during 2 different procedures" (10/04/2014)  . Frequent UTI   . GERD (gastroesophageal reflux disease)   . Hypertension   . MVP (mitral valve prolapse)   . Obesity   . Osteoarthritis   . Presence of permanent cardiac pacemaker   . PVC's (premature ventricular contractions)   . Shortness of breath   . 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 Sleep apnea   . Stroke [facility]) 01/2014   denies deficits on 10/04/2014  . Tachy-brady syndrome ([place]) 10/2014  . TIA (transient ischemic attack)     PAST SURGICAL HISTORY: Past Surgical History:  Procedure Laterality Date  . CARDIOVERSION N/A 09/02/2017   Procedure: CARDIOVERSION;  Surgeon: Sanda Klein, MD;  Location: [facility] ENDOSCOPY;  Service: Cardiovascular;  Laterality: N/A;  . EXCISION VAGINAL CYST     benign nodules  . INSERT / REPLACE / REMOVE PACEMAKER  10/04/2014   Medtronic Advisa model [id number] serial number [id number]  . LOOP RECORDER EXPLANT N/A 10/04/2014   Procedure: LOOP RECORDER EXPLANT;  Surgeon: Sanda Klein, MD;  Location: [place] CATH LAB;  Service: Cardiovascular;  Laterality: N/A;  . LOOP RECORDER IMPLANT N/A 04/19/2014   Procedure: LOOP RECORDER IMPLANT;  Surgeon: Sanda Klein, MD;  Location: [place] CATH LAB;  Service: Cardiovascular;  Laterality: N/A;  . NM MYOCAR PERF WALL MOTION  12/18/2007   normal  . PERMANENT PACEMAKER  INSERTION N/A 10/04/2014   Procedure: PERMANENT PACEMAKER INSERTION;  Surgeon: Sanda Klein, MD;  Location: [place] CATH LAB;  Service: Cardiovascular;  Laterality: N/A;  . TUBAL LIGATION    . US ECHOCARDIOGRAPHY  05/15/2010   LA mildly dilated,mild mitral annular ca+, AOV mildly sclerotic, mild asymmetric LVH    SOCIAL HISTORY: Social History   Tobacco Use  . Smoking status: Former Smoker    Packs/day: 2.00  Years: 18.00    Pack years: 36.00    Types: Cigarettes  . Smokeless tobacco: Never Used  . Tobacco comment: "quit smoking in 1971"  Substance Use Topics  . Alcohol use: Yes    Alcohol/week: 0.0 standard drinks    Comment: 10/04/2014 "might have a drink a couple times/yr"  . Drug use: No    FAMILY HISTORY: Family History  Problem Relation Age of Onset  . Heart attack Mother   . Sudden death Mother   . Depression Mother   . Stroke Father   . Heart failure Father   . Depression Father   . Heart failure Brother   . Stroke Brother   . Alpha-1 antitrypsin deficiency Daughter     ROS: Review of Systems  Constitutional: Negative for weight loss.  Psychiatric/Behavioral: Positive for depression. Negative for suicidal ideas.       Negative for homicidal ideations     PHYSICAL EXAM: Blood pressure 111/73, pulse 82, temperature 97.9 F (36.6 C), temperature source Oral, height _0  (1.676 m), weight 247 lb (112 kg), SpO2 97 %. Body mass index is 39.87 kg/m. Physical Exam  Constitutional: She is oriented to person, place, and time. She appears well-developed and well-nourished.  HENT:  Head: Normocephalic.  Eyes: Pupils are equal, round, and reactive to light.  Neck: Normal range of motion.  Cardiovascular: Normal rate.  Pulmonary/Chest: Effort normal.  Musculoskeletal: Normal range of motion.  Neurological: She is alert and oriented to person, place, and time.  Skin: Skin is warm and dry.  Psychiatric: She has a normal mood and affect. Her behavior is normal.    Vitals reviewed.   RECENT LABS AND TESTS: BMET    Component Value Date/Time   NA 137 10/14/2018 1028   NA 142 09/22/2018 1242   K 4.4 10/14/2018 1028   CL 104 10/14/2018 1028   CO2 30 10/14/2018 1028   GLUCOSE 111 (H) 10/14/2018 1028   BUN 29 (H) 10/14/2018 1028   BUN 19 09/22/2018 1242   CREATININE 0.99 10/14/2018 1028   CREATININE 0.86 09/28/2014 1557   CALCIUM 9.0 10/14/2018 1028   GFRNONAA 55 (L) 09/22/2018 1242   GFRNONAA 56 (L) 06/06/2014 1653   GFRAA 64 09/22/2018 1242   GFRAA 65 06/06/2014 1653   Lab Results  Component Value Date   HGBA1C 5.8 (H) 09/22/2018   HGBA1C 5.8 (H) 06/03/2018   HGBA1C 5.8 (H) 12/24/2017   HGBA1C 6.2 (H) 09/17/2017   HGBA1C 5.9 (H) 03/09/2014   Lab Results  Component Value Date   INSULIN 24.2 09/22/2018   INSULIN 17.6 06/03/2018   INSULIN 19.4 12/24/2017   INSULIN 47.7 (H) 09/17/2017   CBC    Component Value Date/Time   WBC 7.6 10/14/2018 1028   RBC 4.86 10/14/2018 1028   HGB 14.2 10/14/2018 1028   HGB 14.9 09/17/2017 1131   HCT 42.2 10/14/2018 1028   HCT 45.1 09/17/2017 1131   PLT 258.0 10/14/2018 1028   PLT 258 08/27/2017 1509   MCV 86.8 10/14/2018 1028   MCV 86 09/17/2017 1131   MCH 28.5 09/17/2017 1131   MCH 29.0 09/28/2014 1557   MCHC 33.6 10/14/2018 1028   RDW 12.9 10/14/2018 1028   RDW 13.5 09/17/2017 1131   LYMPHSABS 2.0 10/14/2018 1028   LYMPHSABS 2.0 09/17/2017 1131   MONOABS 1.0 10/14/2018 1028   EOSABS 0.2 10/14/2018 1028   EOSABS 0.2 09/17/2017 1131   BASOSABS 0.1 10/14/2018 1028   BASOSABS 0.0 09/17/2017 1131  Iron/TIBC/Ferritin/ %Sat No results found for: IRON, TIBC, FERRITIN, IRONPCTSAT Lipid Panel     Component Value Date/Time   CHOL 176 06/03/2018 1022   TRIG 113 06/03/2018 1022   HDL 53 06/03/2018 1022   CHOLHDL 3 01/15/2017 1417   VLDL 33.6 01/15/2017 1417   LDLCALC 100 (H) 06/03/2018 1022   LDLDIRECT 81.0 07/15/2016 1419   Hepatic Function Panel     Component Value Date/Time   PROT  7.2 10/14/2018 1028   PROT 6.5 09/22/2018 1242   ALBUMIN 3.9 10/14/2018 1028   ALBUMIN 4.0 09/22/2018 1242   AST 19 10/14/2018 1028   ALT 13 10/14/2018 1028   ALKPHOS 58 10/14/2018 1028   BILITOT 0.3 10/14/2018 1028   BILITOT 0.4 09/22/2018 1242   BILIDIR 0.1 01/15/2017 1417      Component Value Date/Time   TSH 2.620 09/22/2018 1242   TSH 2.040 09/17/2017 1131   TSH 2.70 07/15/2016 1419    ASSESSMENT AND PLAN: Other depression - with emotional eating  Class 2 severe obesity with serious comorbidity and body mass index (BMI) of 39.0 to 39.9 in adult, unspecified obesity type ([place])  PLAN: Depression with Emotional Eating Behaviors We discussed behavior modification techniques today to help [name] [name] deal with her emotional eating and depression. Encouragement was provided and she agreed to follow up as directed.  Obesity [name] [name] is currently in the action stage of change. As such, her goal is to continue with weight loss efforts She has agreed to portion control better and make smarter food choices, such as increase vegetables and decrease simple carbohydrates  Jozalynn has been instructed to consider swimming at [employer] which she brought up as an idea. We discussed the following Behavioral Modification Strategies today: increasing lean protein intake, keeping healthy food in the home, celebration eating strategies, planning for success, and emotional eating strategies   Blossom has agreed to follow up with our clinic in 2 weeks. She was informed of the importance of frequent follow up visits to maximize her success with intensive lifestyle modifications for her multiple health conditions.   OBESITY BEHAVIORAL INTERVENTION VISIT  Today's visit was # 24  Starting weight: 267 lb Starting date: 09/17/17 Today's weight : Weight: 247 lb (112 kg)  Today's date: 11/05/18 Total lbs lost to date: 20 lb At least 15 minutes were spent on discussing the following behavioral intervention  visit.   ASK: We discussed the diagnosis of obesity with [name] today and Brittie agreed to give [place] permission to discuss obesity behavioral modification therapy today.  ASSESS: Eulah has the diagnosis of obesity and her BMI today is 39.89 Channon is in the action stage of change   ADVISE: Trent was educated on the multiple health risks of obesity as well as the benefit of weight loss to improve her health. She was advised of the need for long term treatment and the importance of lifestyle modifications to improve her current health and to decrease her risk of future health problems.  AGREE: Multiple dietary modification options and treatment options were discussed and  Randy agreed to follow the recommendations documented in the above note.  ARRANGE: Evann was educated on the importance of frequent visits to treat obesity as outlined per CMS and USPSTF guidelines and agreed to schedule her next follow up appointment today.  I, Renee Ramus, am acting as Location manager for [employer], FNP-C.  I have reviewed the above documentation for accuracy and completeness, and I agree with the above.  - Latravis Grine  Hadi Dubin, FNP-C.

## 2018-11-16 ENCOUNTER — Other Ambulatory Visit: Payer: Self-pay | Admitting: Cardiovascular Disease

## 2018-11-16 NOTE — Telephone Encounter (Signed)
Patient calling the office for samples of medication:   1.  What medication and dosage are you requesting samples for? [name] [name]  2.  Are you currently out of this medication?  Yes

## 2018-11-16 NOTE — Telephone Encounter (Signed)
Spoke with patient and advised no samples available at this time.

## 2018-11-17 ENCOUNTER — Ambulatory Visit: Payer: Self-pay

## 2018-11-17 ENCOUNTER — Telehealth: Payer: Self-pay | Admitting: Cardiovascular Disease

## 2018-11-17 NOTE — Telephone Encounter (Signed)
Advised patient no samples at this time

## 2018-11-17 NOTE — Telephone Encounter (Signed)
Patient calling the office for samples of medication:   1.  What medication and dosage are you requesting samples for?  apixaban (ELIQUIS) 5 MG TABS tablet   2.  Are you currently out of this medication? NO, but will be in a few days.

## 2018-11-17 NOTE — Telephone Encounter (Signed)
Information only call. Pt has reported multiple lung issues and was advised to not visit a hospitalized patient this time of year unless absolutely necessary. Reason for Disposition . General information question, no triage required and triager able to answer question  Answer Assessment - Initial Assessment Questions 1. REASON FOR CALL or QUESTION: "What is your reason for calling today?" or "How can I best help you?" or "What question do you have that I can help answer?"     Would it be dangerous to visit the hospital this year.  Protocols used: INFORMATION ONLY CALL-A-AH

## 2018-11-19 ENCOUNTER — Telehealth: Payer: Self-pay | Admitting: Cardiovascular Disease

## 2018-11-19 ENCOUNTER — Ambulatory Visit (INDEPENDENT_AMBULATORY_CARE_PROVIDER_SITE_OTHER): Payer: Self-pay | Admitting: Family Medicine

## 2018-11-19 NOTE — Progress Notes (Signed)
Office: [phone]  /  Fax: [phone]   Date: December 03, 2018   Time Seen: 12:04pm Duration: 28 minutes Provider: Glennie Isle, Psy.D. Type of Session: Individual Therapy   HPI: [name] referred by Dr. Brent Bulla to depression with emotional eating behaviorsand was seen for an initial appointment by this provider on August 18, 2018. Per the note for the visit withDr. Conrad Jacobus August 04, 2018,"[name] struggling with a lot of boredom eating, she is on Zoloft. [name] [name] struggles withemotional eating and using food for comfort to the extent that it is negatively impactingherhealth. Sheoften snacks when sheis not hungry. [name] feels sheis out of control and then feels guilty that shemade poor food choices. [name] [name] been working on behavior modification techniques to help reduce heremotional eating and has been somewhat successful.Sheshows no sign of suicidal or homicidal ideations."In addition,[name] [name] was initially seen at this clinic by [facility], [facility].During that initial appointment,[name] that she started gaining weight two and a halfyears ago and her heaviest weight ever was 274 pounds. [name] [name] reported experiencing the following: significant food craving issues; snacking frequently in the evening; frequently making poor food choices; frequently eating larger portions than normal; binge eating behaviors; and struggling with emotional eating.[name] [name] disclosed she is a picky eater and does not like to eat healthier foods.During the initial appointment with this provider,[name] [name] shared, "[name] thinks I'm depressed and I'm not really sure that is right." She further explained, "Sometimes, I just eat ferociously. I binge." She described experiencing a "preoccupation with food." [name] [name] shared her last binge wasthe night before the initial appointment with this provider, which consisted of "five or six fig  bars and some popcorn" followed by "two stuffed peppers and two chocolate popsicles" over the course of four hours. She clarified her binge eating behaviors are usually in the evenings, specifically after dinner. [name] [name] described craving sweets.Moreover,[name] [name] indicated binge eating behaviors starting in childhood. She [name] a history of purging and engaging in any other compensatory behaviors. She [name] a history of a eating disorder diagnosis.Furthermore,[name] asked to complete a questionnaire assessing various behaviors related to emotional eatingduring the initial appointment with this provider. [name] [name] the following: overeat when you are celebrating, eat certain foods when you are anxious, stressed, depressed, or your feelings are hurt, use food to help you cope with emotional situations, find food is comforting to you, overeat when you are worried about something, overeat frequently when you are bored or lonely and overeat when you are alone, but eat much less when you are with other people.  Session Content: Session focused on the following treatment goal: decrease emotional eating. The session was initiated with the administration of the PHQ-9 and GAD-7. [name] [name] endorsed question nine on the PHQ-9 and she reported, "That does not mean I'm going to kill myself. I'm 69 and have problems and so think sometimes I would be better off dead." A risk assessment was completed. She [name] experiencing suicidal ideation, plan, and intent. [name] [name] indicated, "You never need to worry about that. Suicide is against my religion." Nevertheless, this provider discussed emergency resources that were previously provided. [name] [name] noted she still has the handout, and further stated she would reach out to her daughter for support if needed. [name] [name] further shared, "I'm depressed, but it will get better. I know my pattern." This was explored further. [name] [name] shared ongoing stressors, including worry about her  brother's health, son not coming home for the holidays, and her nephew passing away. This provider assisted [name] [name] in processing thoughts  and feelings associated with the aforementioned. Regarding eating, she reported ongoing emotional eating due to ongoing stressors; however, noticed a decrease once she made the decision of moving to a retirement home. She explained that it helped her not feel as stuck anymore, but noted she still experiences a "poor me" attitude. Thus, pleasurable activities were reviewed again, and this provider assisted [name] [name] in establishing a short-term goal to assist with cleaning her home. This provider also discussed termination planning, including the option for a referral for longer-term therapeutic services. [name] [name] was receptive to today's session as evidenced by openness to sharing, responsiveness to feedback, willingness to engage in pleasurable activities and initiate longer-term therapeutic services.  Mental Status Examination: [name] [name] arrived on time for the appointment; however, the appointment was initiated late due to this provider. She presented as appropriately dressed and groomed. [name] [name] her stated age and demonstrated adequate orientation to time, place, person, and purpose of the appointment. She also demonstrated appropriate eye contact. No psychomotor abnormalities or behavioral peculiarities noted. Her mood was euthymic with congruent affect. Her thought processes were logical, linear, and goal-directed. No hallucinations, delusions, bizarre thinking or behavior reported or observed. Judgment, insight, and impulse control [name] to be grossly intact. There was no evidence of paraphasias (i.e., errors in speech, gross mispronunciations, and word substitutions), repetition deficits, or disturbances in volume or prosody (i.e., rhythm and intonation). There was no evidence of attention or memory impairments. [name] [name] current suicidal and homicidal ideation,  intent or plan.  Structured Assessment Results: The Patient Health Questionnaire-9 (PHQ-9) is a self-report measure that assesses symptoms and severity of depression over the course of the last two weeks. [name] [name] obtained a score of 15 suggesting moderately severe depression. [name] [name] finds the endorsed symptoms to be somewhat difficult. Depression screen PHQ 2/9 12/03/2018  Decreased Interest 2  Down, Depressed, Hopeless 2  PHQ - 2 Score 4  Altered sleeping 0  Tired, decreased energy 3  Change in appetite 3  Feeling bad or failure about yourself  2  Trouble concentrating 1  Moving slowly or fidgety/restless 1  Suicidal thoughts 1  PHQ-9 Score 15  Difficult doing work/chores -  Some recent data might be hidden   The Generalized Anxiety Disorder-7 (GAD-7) is a brief self-report measure that assesses symptoms of anxiety over the course of the last two weeks. [name] [name] obtained a score of 7 suggesting mild anxiety. GAD 7 : Generalized Anxiety Score 12/03/2018  Nervous, Anxious, on Edge 1  Control/stop worrying 2  Worry too much - different things 2  Trouble relaxing 2  Restless 0  Easily annoyed or irritable 0  Afraid - awful might happen 0  Total GAD 7 Score 7  Anxiety Difficulty Somewhat difficult   Interventions:  Administered PHQ-9 and GAD-7 for symptom monitoring Provided empathic reflections and validation Completed a risk assessment Assisted patient with goal setting Assisted patient with processing thoughts and feelings Discussed termination planning  Reviewed pleasurable activities  DSM-5 Diagnosis: 296.32 (F33.1) Major Depressive Disorder, Recurrent Episode, Moderate, With Anxious Distress,Mild  Treatment Goal & Progress: During the initial appointment with this provider, the following treatment goal was established: decrease emotional eating. [name] [name] has demonstrated progress in her goal as evidenced by increased awareness of hunger patterns and triggers for emotional eating.  She also continues to demonstrate willingness to engage in learned skills and initiate longer-term therapeutic services.   Plan: [name] [name] continues to appear able and willing to participate as evidenced by engagement in reciprocal conversation, and asking questions  for clarification as appropriate. The next appointment will be scheduled in two weeks. The next session will focus on reviewing learned skills, and termination. Additionally, a referral for longer-term therapeutic services will be placed.

## 2018-11-19 NOTE — Telephone Encounter (Signed)
New message      Patient calling the office for samples of medication:   1.  What medication and dosage are you requesting samples for? Eliquis 5 mg   2.  Are you currently out of this medication? One day left

## 2018-11-19 NOTE — Telephone Encounter (Signed)
1 box Eliquis 5 mg # 14, lot [id number],  Exp 04/2020

## 2018-11-20 ENCOUNTER — Telehealth: Payer: Self-pay

## 2018-11-20 ENCOUNTER — Ambulatory Visit (INDEPENDENT_AMBULATORY_CARE_PROVIDER_SITE_OTHER): Payer: Medicare Other | Admitting: Family Medicine

## 2018-11-20 ENCOUNTER — Encounter: Payer: Self-pay | Admitting: Family Medicine

## 2018-11-20 ENCOUNTER — Encounter

## 2018-11-20 VITALS — BP 102/62 | Temp 97.8°F | Wt 251.8 lb

## 2018-11-20 DIAGNOSIS — F411 Generalized anxiety disorder: Secondary | ICD-10-CM

## 2018-11-20 DIAGNOSIS — J302 Other seasonal allergic rhinitis: Secondary | ICD-10-CM | POA: Diagnosis not present

## 2018-11-20 DIAGNOSIS — J0100 Acute maxillary sinusitis, unspecified: Secondary | ICD-10-CM

## 2018-11-20 DIAGNOSIS — I2510 Atherosclerotic heart disease of native coronary artery without angina pectoris: Secondary | ICD-10-CM

## 2018-11-20 DIAGNOSIS — J8410 Pulmonary fibrosis, unspecified: Secondary | ICD-10-CM | POA: Diagnosis not present

## 2018-11-20 LAB — CUP PACEART REMOTE DEVICE CHECK
Battery Remaining Longevity: 81 mo
Battery Voltage: 3.01 V
Brady Statistic AP VP Percent: 0.07 %
Brady Statistic AP VS Percent: 96.86 %
Brady Statistic AS VP Percent: 0 %
Brady Statistic AS VS Percent: 3.07 %
Brady Statistic RA Percent Paced: 95.54 %
Brady Statistic RV Percent Paced: 0.08 %
Date Time Interrogation Session: 20191104152958
Implantable Lead Implant Date: 20151103
Implantable Lead Implant Date: 20151103
Implantable Lead Location: 753859
Implantable Lead Location: 753860
Implantable Lead Model: 5076
Implantable Lead Model: 5076
Implantable Pulse Generator Implant Date: 20151103
Lead Channel Impedance Value: 418 Ohm
Lead Channel Impedance Value: 475 Ohm
Lead Channel Impedance Value: 494 Ohm
Lead Channel Impedance Value: 494 Ohm
Lead Channel Pacing Threshold Amplitude: 0.5 V
Lead Channel Pacing Threshold Amplitude: 0.625 V
Lead Channel Pacing Threshold Pulse Width: 0.4 ms
Lead Channel Pacing Threshold Pulse Width: 0.4 ms
Lead Channel Sensing Intrinsic Amplitude: 5 mV
Lead Channel Sensing Intrinsic Amplitude: 5 mV
Lead Channel Sensing Intrinsic Amplitude: 6 mV
Lead Channel Setting Pacing Amplitude: 1.5 V
Lead Channel Setting Pacing Pulse Width: 0.4 ms
Lead Channel Setting Sensing Sensitivity: 2.8 mV
MDC IDC MSMT LEADCHNL RV SENSING INTR AMPL: 6 mV
MDC IDC SET LEADCHNL RV PACING AMPLITUDE: 2 V

## 2018-11-20 MED ORDER — DIAZEPAM 5 MG PO TABS: ORAL_TABLET | ORAL | 1 refills | Status: DC

## 2018-11-20 MED ORDER — AZITHROMYCIN 250 MG PO TABS: ORAL_TABLET | ORAL | 0 refills | Status: DC

## 2018-11-20 NOTE — Telephone Encounter (Signed)
Eliquis  5 mg samples , 4 boxes  Lot [id number]  Exp 05/2021

## 2018-11-20 NOTE — Progress Notes (Signed)
[name] [name] is a 77 y.o. female is here for follow up.  History of Present Illness:   HPI: See Assessment and Plan section for Problem Based Charting of issues discussed today.   Health Maintenance Due  Topic Date Due  . MAMMOGRAM  03/22/2018   Depression screen [facility] 2/9 11/05/2018 10/20/2018 09/16/2018  Decreased Interest 1 1 0  Down, Depressed, Hopeless _0 PHQ - 2 Score _1 Altered sleeping 0 0 1  Tired, decreased energy _2 Change in appetite _3 Feeling bad or failure about yourself  _4 Trouble concentrating 0 0 0  Moving slowly or fidgety/restless 0 0 0  Suicidal thoughts 0 0 0  PHQ-9 Score _5 Difficult doing work/chores - - -  Some recent data might be hidden   PMHx, SurgHx, SocialHx, FamHx, Medications, and Allergies were reviewed in the Visit Navigator and updated as appropriate.   Patient Active Problem List   Diagnosis Date Noted  . Dark stools 10/15/2018  . Generalized abdominal pain 10/15/2018  . Despression with emotional eating behaviors, followed by [facility] 10/15/2018  . Seasonal allergies, using Flonase and Loratadine prn 10/15/2018  . Atherosclerosis of aorta ([place]) 10/15/2018  . Diastolic dysfunction 94/32/7614  . Prediabetes 11/05/2017  . Vitamin D deficiency 09/17/2017  . Class 3 severe obesity with serious comorbidity and body mass index (BMI) of 40.0 to 44.9 in adult ([place]) 09/17/2017  . Hypertriglyceridemia 09/17/2017  . Multiple lung nodules 05/23/2017  . Pulmonary fibrosis ([place]), followed by Pulmonolgy 05/23/2017  . Abnormal CT scan of lung 01/07/2017  . GERD (gastroesophageal reflux disease) 12/12/2016  . Dyspnea on exertion 12/10/2016  . Anxiety state 07/15/2016  . OSA on CPAP, compliant 05/04/2016  . Sinus arrest 10/04/2014  . Pacemaker 10/04/2014  . Long term current use of anticoagulant 06/27/2014  . PAF (paroxysmal atrial fibrillation) ([place]) 06/06/2014  . Tachy-brady syndrome ([place]) 06/06/2014  . TIA (transient  ischemic attack) 03/08/2014  . Osteopenia 08/15/2008   Social History   Tobacco Use  . Smoking status: Former Smoker    Packs/day: 2.00    Years: 18.00    Pack years: 36.00    Types: Cigarettes  . Smokeless tobacco: Never Used  . Tobacco comment: "quit smoking in 1971"  Substance Use Topics  . Alcohol use: Yes    Alcohol/week: 0.0 standard drinks    Comment: 10/04/2014 "might have a drink a couple times/yr"  . Drug use: No   Current Medications and Allergies:   .  apixaban (ELIQUIS) 5 MG TABS tablet, Take 1 tablet (5 mg total) by mouth 2 (two) times daily., Disp: 180 tablet, Rfl: 1 .  Biotin 5000 MCG CAPS, Take 5,000 mcg by mouth daily. , Disp: , Rfl:  .  Calcium Carbonate-Vit D-Min (CALCIUM 1200 PO), Take 2,400 mg by mouth daily. , Disp: , Rfl:  .  cholecalciferol (VITAMIN D) 1000 UNITS tablet, Take 1,000 Units by mouth daily. , Disp: , Rfl:  .  Co-Enzyme Q10 100 MG CAPS, Take 100 mg by mouth daily. , Disp: , Rfl:  .  Dexlansoprazole (DEXILANT) 30 MG capsule, Take 1 capsule (30 mg total) by mouth daily., Disp: 30 capsule, Rfl: 2 .  diazepam (VALIUM) 5 MG tablet, TAKE 1 TABLET BY MOUTH ONCE DAILY AS NEEDED FOR ANXIETY, Disp: 30 tablet, Rfl: 1 .  diclofenac sodium (VOLTAREN) 1 % GEL, Apply 2 g topically 4 (four) times daily  as needed., Disp: 200 g, Rfl: 3 .  fluticasone (FLONASE) 50 MCG/ACT nasal spray, Place 2 sprays into both nostrils daily., Disp: 16 g, Rfl: 6 .  folic acid (FOLVITE) 341 MCG tablet, Take 400 mcg by mouth daily., Disp: , Rfl:  .  glucosamine-chondroitin 500-400 MG tablet, Take 1 tablet by mouth daily. , Disp: , Rfl:  .  Glycopyrrolate-Formoterol (BEVESPI AEROSPHERE) 9-4.8 MCG/ACT AERO, Inhale 2 puffs into the lungs 2 (two) times daily., Disp: 1 Inhaler, Rfl: 0 .  loratadine (KLS ALLERCLEAR) 10 MG tablet, Take 10 mg by mouth daily., Disp: , Rfl:  .  MAGNESIUM CITRATE PO, Take 250 mg by mouth daily. 1 tab daily, Disp: , Rfl:  .  nitroGLYCERIN (NITROSTAT) 0.4 MG SL  tablet, Place 0.4 mg under the tongue every 5 (five) minutes as needed for chest pain., Disp: , Rfl:  .  OVER THE COUNTER MEDICATION, CBD oil, Disp: , Rfl:  .  Resveratrol 250 MG CAPS, Take 250 mg by mouth daily., Disp: , Rfl:  .  sertraline (ZOLOFT) 100 MG tablet, TAKE 1 TABLET BY MOUTH ONCE DAILY, Disp: 90 tablet, Rfl: 0 .  Simethicone (GAS-X PO), Take 2 tablets by mouth daily as needed (bloating)., Disp: , Rfl:  .  Spacer/Aero-Holding Chambers (AEROCHAMBER MV) inhaler, Use as instructed, Disp: 1 each, Rfl: 0 .  sucralfate (CARAFATE) 1 g tablet, Take 1 tablet (1 g total) by mouth 4 (four) times daily -  with meals and at bedtime., Disp: 60 tablet, Rfl: 1 .  Triamcinolone Acetonide ([employer] NASAL ALLERGY SPRAY NA), Place into the nose., Disp: , Rfl:    Allergies  Allergen Reactions  . Ancef [Cefazolin] Hives and Itching  . Pseudoephedrine Hcl     Other reaction(s): palpitations (moderate)  . Ergotamine   . Pentobarbital   . Caffeine Palpitations   Review of Systems   Pertinent items are noted in the HPI. Otherwise, a complete ROS is negative.  Vitals:   Vitals:   11/20/18 1307  BP: 102/62  Temp: 97.8 F (36.6 C)  TempSrc: Oral  Weight: 251 lb 12.8 oz (114.2 kg)     Body mass index is 40.64 kg/m.  Physical Exam:   Physical Exam Vitals signs and nursing note reviewed.  HENT:     Head: Normocephalic and atraumatic.  Eyes:     Pupils: Pupils are equal, round, and reactive to light.  Neck:     Musculoskeletal: Normal range of motion and neck supple.  Cardiovascular:     Rate and Rhythm: Normal rate and regular rhythm.     Heart sounds: Normal heart sounds.  Pulmonary:     Effort: Pulmonary effort is normal.  Abdominal:     Palpations: Abdomen is soft.  Skin:    General: Skin is warm.  Psychiatric:        Behavior: Behavior normal.    Assessment and Plan:   Subacute maxillary sinusitis Patient complains of sinus pain and pressure, with mild PND, also associated  with dry cough. No fever, CP, SOB, productive cough at this time. Hx of allergies and ILD. Will provide safety net Rx in case symptoms worsen over the weekend, but recommended to hold it until then. Red flags reviewed.   Anxiety state Assessment: Current symptoms: jittery, difficulty sleeping, happens a few times per week. No current suicidal and homicidal ideation. Side effects from treatment: none.  Plan: 1. Medications: as ordered, she has been on them for years, using sparingly. Discussed potential risks, expected benefits,  possible side effects of the medicine. We also discussed how to take it correctly and dosing instructions.  2. Labs: see orders. 3. Counseling declined. 4. If she has any significant side effects to the medicine, she is to stop it and call for advice. Instructed patient to contact office or on-call physician promptly should condition worsen or any new symptoms appear.   Meds ordered this encounter  Medications  . diazepam (VALIUM) 5 MG tablet    Sig: Take 1 tablet po daily    Dispense:  30 tablet    Refill:  1  . azithromycin (ZITHROMAX) 250 MG tablet    Sig: 2 tab on day one and one a day until finished    Dispense:  6 tablet    Refill:  0    . Reviewed expectations re: course of current medical issues. . Discussed self-management of symptoms. . Outlined signs and symptoms indicating need for more acute intervention. . Patient verbalized understanding and all questions were answered. Marland Kitchen Health Maintenance issues including appropriate healthy diet, exercise, and smoking avoidance were discussed with patient. . See orders for this visit as documented in the electronic medical record. . Patient received an After Visit Summary.  Briscoe Deutscher, DO [place], [facility] 11/22/2018

## 2018-11-22 ENCOUNTER — Encounter: Payer: Self-pay | Admitting: Family Medicine

## 2018-11-22 DIAGNOSIS — I2510 Atherosclerotic heart disease of native coronary artery without angina pectoris: Secondary | ICD-10-CM | POA: Insufficient documentation

## 2018-11-22 NOTE — Assessment & Plan Note (Signed)
Assessment: Current symptoms: jittery, difficulty sleeping, happens a few times per week. No current suicidal and homicidal ideation. Side effects from treatment: none.  Plan: 1. Medications: as ordered, she has been on them for years, using sparingly. Discussed potential risks, expected benefits, possible side effects of the medicine. We also discussed how to take it correctly and dosing instructions.  2. Labs: see orders. 3. Counseling declined. 4. If she has any significant side effects to the medicine, she is to stop it and call for advice. Instructed patient to contact office or on-call physician promptly should condition worsen or any new symptoms appear.

## 2018-11-22 NOTE — Assessment & Plan Note (Signed)
Patient complains of sinus pain and pressure, with mild PND, also associated with dry cough. No fever, CP, SOB, productive cough at this time. Hx of allergies and ILD. Will provide safety net Rx in case symptoms worsen over the weekend, but recommended to hold it until then. Red flags reviewed.

## 2018-12-03 ENCOUNTER — Ambulatory Visit (INDEPENDENT_AMBULATORY_CARE_PROVIDER_SITE_OTHER): Payer: Medicare Other | Admitting: Psychology

## 2018-12-03 DIAGNOSIS — F331 Major depressive disorder, recurrent, moderate: Secondary | ICD-10-CM | POA: Diagnosis not present

## 2018-12-07 ENCOUNTER — Ambulatory Visit (INDEPENDENT_AMBULATORY_CARE_PROVIDER_SITE_OTHER): Payer: Medicare Other | Admitting: Family Medicine

## 2018-12-07 ENCOUNTER — Encounter (INDEPENDENT_AMBULATORY_CARE_PROVIDER_SITE_OTHER): Payer: Self-pay | Admitting: Family Medicine

## 2018-12-07 VITALS — BP 124/81 | HR 85 | Temp 98.1°F | Ht 66.0 in | Wt 251.0 lb

## 2018-12-07 DIAGNOSIS — Z6841 Body Mass Index (BMI) 40.0 and over, adult: Secondary | ICD-10-CM | POA: Diagnosis not present

## 2018-12-07 DIAGNOSIS — R7303 Prediabetes: Secondary | ICD-10-CM | POA: Diagnosis not present

## 2018-12-07 DIAGNOSIS — F3289 Other specified depressive episodes: Secondary | ICD-10-CM

## 2018-12-07 MED ORDER — BUPROPION HCL ER (SR) 150 MG PO TB12: 150.0000 mg | ORAL_TABLET | Freq: Every day | ORAL | 0 refills | Status: DC

## 2018-12-09 ENCOUNTER — Encounter (INDEPENDENT_AMBULATORY_CARE_PROVIDER_SITE_OTHER): Payer: Self-pay | Admitting: Family Medicine

## 2018-12-09 NOTE — Progress Notes (Signed)
Office: [phone]  /  Fax: [phone]   HPI:   Chief Complaint: OBESITY [name] [name] is here to discuss her progress with her obesity treatment plan. She is on the portion control better and make smarter food choices, such as increase vegetables and decrease simple carbohydrates and is following her [name] plan approximately 0% of the time. She states she is exercising 0 minutes 0 times per week. [name] [name] to stress [name] that is not well controlled. She denies actual hunger. She is moving to [facility] (assisted living) in a few months.  She would like to follow up with Dr. Leafy Ro because she [name] she may be more accountable to her.  Her weight is 251 lb (113.9 kg) today and has gained 4 pounds since her last visit. She has lost 16 lbs since starting treatment with [place].  [name] based on her elevated Hgb A1c and was informed this puts her at greater risk of developing diabetes. She is not taking metformin currently and continues to work on diet and exercise to decrease risk of diabetes. She denies polyphagia or hypoglycemia.  Depression with emotional [name] behaviors [name] [name] and it is not well controlled. She reports overall depression overall and she is seeing Dr. Mallie Mussel. She is unsure about starting [name] and wants to think about it for a few days before picking the script up. She is using food for comfort to the extent that it is negatively impacting her health. She often snacks when she is not hungry. [name] [name] she is out of control and then [name] guilty that she made poor food choices. She has been working on behavior modification techniques to help reduce her emotional [name] and has been somewhat successful. She shows no sign of suicidal or homicidal ideations.  Depression screen [name] [name] 11/05/2018 10/20/2018 09/16/2018 08/18/2018  Decreased Interest _0 0 1  Down, Depressed, Hopeless _1 PHQ - 2 Score _2 Altered sleeping 0 0 0 1 1  Tired, decreased energy _3 Change in appetite _4 Feeling bad or failure about yourself  _5 Trouble concentrating 1 0 0 0 1  Moving slowly or fidgety/restless 1 0 0 0 0  Suicidal thoughts 1 0 0 0 0  PHQ-9 Score _6 Difficult doing work/chores - - - - -  Some recent data might be hidden    ALLERGIES: Allergies  Allergen Reactions  . Ancef [Cefazolin] Hives and Itching  . Pseudoephedrine Hcl     Other reaction(s): palpitations (moderate)  . Ergotamine   . Pentobarbital   . Caffeine Palpitations    MEDICATIONS: Current Outpatient Medications on File Prior to Visit  Medication Sig Dispense Refill  . apixaban (ELIQUIS) 5 MG TABS tablet Take 1 tablet (5 mg total) by mouth 2 (two) times daily. 180 tablet 1  . azithromycin (ZITHROMAX) 250 MG tablet 2 tab on day one and one a day until finished 6 tablet 0  . Biotin 5000 MCG CAPS Take 5,000 mcg by mouth daily.     . Calcium Carbonate-Vit D-Min (CALCIUM 1200 PO) Take 2,400 mg by mouth daily.     . cholecalciferol (VITAMIN D) 1000 UNITS tablet Take 1,000 Units by mouth daily.     Marland Kitchen Co-Enzyme Q10 100 MG CAPS Take 100 mg by mouth  daily.     . Dexlansoprazole (DEXILANT) 30 MG capsule Take 1 capsule (30 mg total) by mouth daily. 30 capsule 2  . diazepam (VALIUM) 5 MG tablet Take 1 tablet po daily 30 tablet 1  . diclofenac sodium (VOLTAREN) 1 % GEL Apply 2 g topically 4 (four) times daily as needed. 200 g 3  . fluticasone (FLONASE) 50 MCG/ACT nasal spray Place 2 sprays into both nostrils daily. 16 g 6  . folic acid (FOLVITE) 614 MCG tablet Take 400 mcg by mouth daily.    Marland Kitchen glucosamine-chondroitin 500-400 MG tablet Take 1 tablet by mouth daily.     . Glycopyrrolate-Formoterol (BEVESPI AEROSPHERE) 9-4.8 MCG/ACT AERO Inhale 2 puffs into the lungs 2 (two) times daily. 1 Inhaler 0  . loratadine (KLS ALLERCLEAR) 10 MG tablet Take 10 mg by mouth daily.     Marland Kitchen MAGNESIUM CITRATE PO Take 250 mg by mouth daily. 1 tab daily    . nitroGLYCERIN (NITROSTAT) 0.4 MG SL tablet Place 0.4 mg under the tongue every 5 (five) minutes as needed for chest pain.    Marland Kitchen OVER THE COUNTER MEDICATION CBD oil    . Resveratrol 250 MG CAPS Take 250 mg by mouth daily.    . sertraline (ZOLOFT) 100 MG tablet TAKE 1 TABLET BY MOUTH ONCE DAILY 90 tablet 0  . Simethicone (GAS-X PO) Take 2 tablets by mouth daily as needed (bloating).    . Spacer/Aero-Holding Chambers (AEROCHAMBER MV) inhaler Use as instructed 1 each 0  . sucralfate (CARAFATE) 1 g tablet Take 1 tablet (1 g total) by mouth 4 (four) times daily -  with meals and at bedtime. 60 tablet 1  . Triamcinolone Acetonide (CVS NASAL ALLERGY SPRAY NA) Place into the nose.     No current facility-administered medications on file prior to visit.     PAST MEDICAL HISTORY: Past Medical History:  Diagnosis Date  . Alpha-1-antitrypsin deficiency carrier   . Arthritis    "all over"  . Atrial fibrillation ([place])   . Back pain   . Complication of anesthesia    "I woke up during 2 different procedures" (10/04/2014)  . Frequent UTI   . GERD (gastroesophageal reflux disease)   . Hypertension   . MVP (mitral valve prolapse)   . Obesity   . Osteoarthritis   . Presence of permanent cardiac pacemaker   . PVC's (premature ventricular contractions)   . Shortness of breath   . 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 Sleep apnea   . Stroke [place] [name]) 01/2014   denies deficits on 10/04/2014  . Tachy-brady syndrome ([place]) 10/2014  . TIA (transient ischemic attack)     PAST SURGICAL HISTORY: Past Surgical History:  Procedure Laterality Date  . CARDIOVERSION N/A 09/02/2017   Procedure: CARDIOVERSION;  Surgeon: Sanda Klein, MD;  Location: [facility];  Service: Cardiovascular;  Laterality: N/A;  . EXCISION VAGINAL CYST     benign nodules  . INSERT / REPLACE / REMOVE PACEMAKER  10/04/2014     Medtronic Advisa model [id number] serial number [id number]  . LOOP RECORDER EXPLANT N/A 10/04/2014   Procedure: LOOP RECORDER EXPLANT;  Surgeon: Sanda Klein, MD;  Location: [place] CATH LAB;  Service: Cardiovascular;  Laterality: N/A;  . LOOP RECORDER IMPLANT N/A 04/19/2014   Procedure: LOOP RECORDER IMPLANT;  Surgeon: Sanda Klein, MD;  Location: [place] CATH LAB;  Service: Cardiovascular;  Laterality: N/A;  . NM MYOCAR PERF WALL MOTION  12/18/2007  normal  . PERMANENT PACEMAKER INSERTION N/A 10/04/2014   Procedure: PERMANENT PACEMAKER INSERTION;  Surgeon: Sanda Klein, MD;  Location: [facility] CATH LAB;  Service: Cardiovascular;  Laterality: N/A;  . TUBAL LIGATION    . US ECHOCARDIOGRAPHY  05/15/2010   LA mildly dilated,mild mitral annular ca+, AOV mildly sclerotic, mild asymmetric LVH    SOCIAL HISTORY: Social History   Tobacco Use  . Smoking status: Former Smoker    Packs/day: 2.00    Years: 18.00    Pack years: 36.00    Types: Cigarettes  . Smokeless tobacco: Never Used  . Tobacco comment: "quit smoking in 1971"  Substance Use Topics  . Alcohol use: Yes    Alcohol/week: 0.0 standard drinks    Comment: 10/04/2014 "might have a drink a couple times/yr"  . Drug use: No    FAMILY HISTORY: Family History  Problem Relation Age of Onset  . Heart attack Mother   . Sudden death Mother   . Depression Mother   . Stroke Father   . Heart failure Father   . Depression Father   . Heart failure Brother   . Stroke Brother   . Alpha-1 antitrypsin deficiency Daughter     ROS: Review of Systems  Constitutional: Negative for weight loss.  Endo/Heme/Allergies:       Negative polyphagia Negative hypoglycemia  Psychiatric/Behavioral: Positive for depression. Negative for suicidal ideas.    PHYSICAL EXAM: Blood pressure 124/81, pulse 85, temperature 98.1 F (36.7 C), temperature source Oral, height _0  (1.676 m), weight 251 lb (113.9 kg), SpO2 96 %. Body mass index is 40.51 kg/m. Physical  Exam Vitals signs reviewed.  Constitutional:      Appearance: Normal appearance. She is obese.  Cardiovascular:     Rate and Rhythm: Normal rate.     Pulses: Normal pulses.  Pulmonary:     Effort: Pulmonary effort is normal.  Musculoskeletal: Normal range of motion.  Skin:    General: Skin is warm and dry.  Neurological:     Mental Status: She is alert and oriented to person, place, and time.  Psychiatric:        Mood and Affect: Mood normal.        Behavior: Behavior normal.     RECENT LABS AND TESTS: BMET    Component Value Date/Time   NA 137 10/14/2018 1028   NA 142 09/22/2018 1242   K 4.4 10/14/2018 1028   CL 104 10/14/2018 1028   CO2 30 10/14/2018 1028   GLUCOSE 111 (H) 10/14/2018 1028   BUN 29 (H) 10/14/2018 1028   BUN 19 09/22/2018 1242   CREATININE 0.99 10/14/2018 1028   CREATININE 0.86 09/28/2014 1557   CALCIUM 9.0 10/14/2018 1028   GFRNONAA 55 (L) 09/22/2018 1242   GFRNONAA 56 (L) 06/06/2014 1653   GFRAA 64 09/22/2018 1242   GFRAA 65 06/06/2014 1653   Lab Results  Component Value Date   HGBA1C 5.8 (H) 09/22/2018   HGBA1C 5.8 (H) 06/03/2018   HGBA1C 5.8 (H) 12/24/2017   HGBA1C 6.2 (H) 09/17/2017   HGBA1C 5.9 (H) 03/09/2014   Lab Results  Component Value Date   INSULIN 24.2 09/22/2018   INSULIN 17.6 06/03/2018   INSULIN 19.4 12/24/2017   INSULIN 47.7 (H) 09/17/2017   CBC    Component Value Date/Time   WBC 7.6 10/14/2018 1028   RBC 4.86 10/14/2018 1028   HGB 14.2 10/14/2018 1028   HGB 14.9 09/17/2017 1131   HCT 42.2 10/14/2018 1028  HCT 45.1 09/17/2017 1131   PLT 258.0 10/14/2018 1028   PLT 258 08/27/2017 1509   MCV 86.8 10/14/2018 1028   MCV 86 09/17/2017 1131   MCH 28.5 09/17/2017 1131   MCH 29.0 09/28/2014 1557   MCHC 33.6 10/14/2018 1028   RDW 12.9 10/14/2018 1028   RDW 13.5 09/17/2017 1131   LYMPHSABS 2.0 10/14/2018 1028   LYMPHSABS 2.0 09/17/2017 1131   MONOABS 1.0 10/14/2018 1028   EOSABS 0.2 10/14/2018 1028   EOSABS 0.2  09/17/2017 1131   BASOSABS 0.1 10/14/2018 1028   BASOSABS 0.0 09/17/2017 1131   Iron/TIBC/Ferritin/ %Sat No results found for: IRON, TIBC, FERRITIN, IRONPCTSAT Lipid Panel     Component Value Date/Time   CHOL 176 06/03/2018 1022   TRIG 113 06/03/2018 1022   HDL 53 06/03/2018 1022   CHOLHDL 3 01/15/2017 1417   VLDL 33.6 01/15/2017 1417   LDLCALC 100 (H) 06/03/2018 1022   LDLDIRECT 81.0 07/15/2016 1419   Hepatic Function Panel     Component Value Date/Time   PROT 7.2 10/14/2018 1028   PROT 6.5 09/22/2018 1242   ALBUMIN 3.9 10/14/2018 1028   ALBUMIN 4.0 09/22/2018 1242   AST 19 10/14/2018 1028   ALT 13 10/14/2018 1028   ALKPHOS 58 10/14/2018 1028   BILITOT 0.3 10/14/2018 1028   BILITOT 0.4 09/22/2018 1242   BILIDIR 0.1 01/15/2017 1417      Component Value Date/Time   TSH 2.620 09/22/2018 1242   TSH 2.040 09/17/2017 1131   TSH 2.70 07/15/2016 1419    ASSESSMENT AND PLAN: Prediabetes  Other depression - with emotional [name]  Class 3 severe obesity with serious comorbidity and body mass index (BMI) of 40.0 to 44.9 in adult, unspecified obesity type ([place])  PLAN:  [name] Ndia will continue her meal plan, and will continue to work on weight loss, exercise, and decreasing simple carbohydrates in her diet to help decrease the risk of diabetes.  Maie declined metformin for now and a prescription was not written today. Tenelle agreed to follow up with our clinic in 2 weeks as directed to monitor her progress.  Depression with Emotional [name] Behaviors We discussed behavior modification techniques today to help [name] [name] deal with her emotional [name] and depression. Myosha agreed to start [name] 150 mg q AM #30 with no refills, and she will continue taking Zoloft. However, after script was sent the patient decided she is not sure she wants to take the [name]. I advised her to have the pharmacy put the prescription on file if she chooses not to take the [name].  Nicolet agrees to follow up with our clinic in 2 weeks.  Obesity Takiya is currently in the action stage of change. As such, her goal is to continue with weight loss efforts She has agreed to portion control better and make smarter food choices, such as increase vegetables and decrease simple carbohydrates  Heavenleigh has not been prescribed exercise at this time. We discussed the following Behavioral Modification Strategies today: emotional [name] strategies, keeping healthy foods in the home, and planning for success   Rozlynn has agreed to follow up with our clinic in 2 weeks. She was informed of the importance of frequent follow up visits to maximize her success with intensive lifestyle modifications for her multiple health conditions.   OBESITY BEHAVIORAL INTERVENTION VISIT  Today's visit was # 25  Starting weight: 267 lbs Starting date: 09/17/17 Today's weight : 251 lbs  Today's date: 12/07/2018 Total lbs lost to date: 68  At least 15 minutes were spent on discussing the following behavioral intervention visit.   ASK: We discussed the diagnosis of obesity with [name] today and Jamey agreed to give [place] permission to discuss obesity behavioral modification therapy today.  ASSESS: Emmilynn has the diagnosis of obesity and her BMI today is 40.53 Corrie is in the action stage of change   ADVISE: Taliyah was educated on the multiple health risks of obesity as well as the benefit of weight loss to improve her health. She was advised of the need for long term treatment and the importance of lifestyle modifications to improve her current health and to decrease her risk of future health problems.  AGREE: Multiple dietary modification options and treatment options were discussed and  Jayce agreed to follow the recommendations documented in the above note.  ARRANGE: Emely was educated on the importance of frequent visits to treat obesity as outlined per CMS and USPSTF guidelines and  agreed to schedule her next follow up appointment today.  Wilhemena Durie, am acting as Location manager for [employer], FNP-C.  I have reviewed the above documentation for accuracy and completeness, and I agree with the above.  - Shamra Bradeen, FNP-C.

## 2018-12-14 ENCOUNTER — Telehealth: Payer: Self-pay | Admitting: Family Medicine

## 2018-12-14 NOTE — Progress Notes (Addendum)
Office: [phone]  /  Fax: [phone]    Date: December 17, 2018   Time Seen: 3:44pm Duration: 33 minutes Provider: Glennie Isle, Psy.D. Type of Session: Individual Therapy  Type of Contact: Face-to-face  HPI: [name] referred by Dr. Brent Bulla to depression with emotional eating behaviorsand was seen for an initial appointment by this provider on August 18, 2018. Per the note for the visit withDr. Conrad Farmland August 04, 2018,"[name] struggling with a lot of boredom eating, she is on Zoloft. [name] [name] struggles withemotional eating and using food for comfort to the extent that it is negatively impactingherhealth. Sheoften snacks when sheis not hungry. [name] feels sheis out of control and then feels guilty that shemade poor food choices. [name] [name] been working on behavior modification techniques to help reduce heremotional eating and has been somewhat successful.Sheshows no sign of suicidal or homicidal ideations."In addition,[name] [name] was initially seen at this clinic by [facility] September 17, 2017.During that initial appointment,[name] that she started gaining weight two and a halfyears ago and her heaviest weight ever was 274 pounds. Massie reported experiencing the following: significant food craving issues; snacking frequently in the evening; frequently making poor food choices; frequently eating larger portions than normal; binge eating behaviors; and struggling with emotional eating.[name] disclosed she is a picky eater and does not like to eat healthier foods.During the initial appointment with this provider,Ascencion shared, "[name] thinks I'm depressed and I'm not really sure that is right." She further explained, "Sometimes, I just eat ferociously. I binge." She described experiencing a "preoccupation with food." [name] [name] shared her last binge wasthe night before the initial appointment with this provider,  which consisted of "five or six fig bars and some popcorn" followed by "two stuffed peppers and two chocolate popsicles" over the course of four hours. She clarified her binge eating behaviors are usually in the evenings, specifically after dinner. Roe described craving sweets.Moreover,[name] [name] indicated binge eating behaviors starting in childhood. She denied a history of purging and engaging in any other compensatory behaviors. She denied a history of a eating disorder diagnosis.Furthermore,[name] asked to complete a questionnaire assessing various behaviors related to emotional eatingduring the initial appointment with this provider. [name]: overeat when you are celebrating, eat certain foods when you are anxious, stressed, depressed, or your feelings are hurt, use food to help you cope with emotional situations, find food is comforting to you, overeat when you are worried about something, overeat frequently when you are bored or lonely and overeat when you are alone, but eat much less when you are with other people.  During today's appointment, [name] reported decreased appetite and described deviations from the prescribed meal plan.   Session Content: Session focused on the following treatment goal: decrease emotional eating. The session was initiated with the administration of the PHQ-9 and GAD-7, as well as a brief check-in. [name] [name] shared two of her "closest friends" have been dealing with stressors related to their well-being. [name] [name] endorsed question #9 on the PHQ-9. A risk assessment was completed. She denied suicidal ideation, plan, and intent. [name] [name] noted, "I'm selfish, I would never do anything." She further explained it is due to her age, ongoing pain, and recent stressors related to moving and passing away would not be a problem; however, she denied ever wanting to take steps to end her life. [name] [name] noted, "I'm a million miles away from suicide" and explained she  would seek assistance should she experience suicidal ideation. This provider followed up on the referral placed, as chart  review revealed she was called once. [name] [name] reportedly informed the referral coordinator that called that she would call back. This provider recommended Asma call back and schedule due to ongoing stressors. [name] agreed. Regarding eating, [name] [name] noted, "It's bad." This was explored. She described decreased appetite, specifically in the mornings. [name] [name] also described eating snacks high in calorie; therefore, this provider discussed alternatives for snacks. Furthermore, psychoeducation regarding mindfulness was provided. A handout was provided to [name] with further information regarding mindfulness, including exercises. This provider also explained the benefit of mindfulness as it relates to emotional eating as well as stressors. [name] [name] was encouraged to engage in the provided exercises between now and the next appointment with this provider. [name] agreed. [name] [name] was led through an exercise involving her senses. Though it was previously discussed today would be the last appointment with this provider, [name] [name] was receptive to scheduling a follow up appointment until she is established with a new provider for mental health services. [name] [name] was receptive to today's session as evidenced by openness to sharing, responsiveness to feedback, and engagement in the mindfulness exercise.   Mental Status Examination: [name] [name] arrived early for the appointment. She presented as appropriately dressed and groomed. [name] [name] appeared her stated age and demonstrated adequate orientation to time, place, person, and purpose of the appointment. She also demonstrated appropriate eye contact. No psychomotor abnormalities or behavioral peculiarities noted. Her mood was euthymic with congruent affect. Her thought processes were logical, linear, and goal-directed. No hallucinations, delusions, bizarre thinking or  behavior reported or observed. Judgment, insight, and impulse control appeared to be grossly intact. There was no evidence of paraphasias (i.e., errors in speech, gross mispronunciations, and word substitutions), repetition deficits, or disturbances in volume or prosody (i.e., rhythm and intonation). There was no evidence of attention or memory impairments. Keirstin denied current suicidal and homicidal ideation, intent or plan.  Structured Assessment Results: The Patient Health Questionnaire-9 (PHQ-9) is a self-report measure that assesses symptoms and severity of depression over the course of the last two weeks. Ritisha obtained a score of 10 suggesting moderate depression. Jyrah finds the endorsed symptoms to be not difficult at all. Depression screen PHQ 2/9 12/17/2018  Decreased Interest 1  Down, Depressed, Hopeless 1  PHQ - 2 Score 2  Altered sleeping 0  Tired, decreased energy 3  Change in appetite 2  Feeling bad or failure about yourself  2  Trouble concentrating 0  Moving slowly or fidgety/restless 0  Suicidal thoughts 1  PHQ-9 Score 10  Difficult doing work/chores -  Some recent data might be hidden   The Generalized Anxiety Disorder-7 (GAD-7) is a brief self-report measure that assesses symptoms of anxiety over the course of the last two weeks. Abbygale obtained a score of 6 suggesting mild anxiety. GAD 7 : Generalized Anxiety Score 12/17/2018  Nervous, Anxious, on Edge 2  Control/stop worrying 2  Worry too much - different things 1  Trouble relaxing 1  Restless 0  Easily annoyed or irritable 0  Afraid - awful might happen 0  Total GAD 7 Score 6  Anxiety Difficulty Not difficult at all   Interventions:  Administration of PHQ-9 and GAD-7 for symptom monitoring Empathic reflections and validation Psychoeducation regarding mindfulness Mindfulness exercise Termination planning Brief chart review  Brief risk assessment  DSM-5 Diagnosis: 296.32 (F33.1) Major Depressive Disorder,  Recurrent Episode, Moderate, With Anxious Distress,Mild  Treatment Goal & Progress: During the initial appointment with this provider, the following treatment goal was established: decrease emotional eating. Melda has demonstrated  limited progress in her goal as evidenced by deviations from her meal plan. However, she has demonstrated increased awareness of hunger patterns and triggers for emotional eating. She also continues to demonstrate willingness to engage in learned skills.   Plan: Haydn continues to appear able and willing to participate as evidenced by engagement in reciprocal conversation, and asking questions for clarification as appropriate. The next appointment will be scheduled in two weeks. The next session will focus on reviewing learned skills, and termination. This provider will also check-in on the status of the referral.

## 2018-12-14 NOTE — Telephone Encounter (Signed)
See note  Copied from [place] [id number]. Topic: General - Other >> Dec 14, 2018  4:03 PM Alfredia Ferguson R wrote: Patient called in and stated mailed a health form from [facility] to the office and wants to know if it was received

## 2018-12-15 NOTE — Telephone Encounter (Signed)
Called patient back let her know that we have not received ppw will call back Thursday if not received. At that time if we do not have we will get contact number for office and have them fax [place] the ppw.

## 2018-12-16 NOTE — Telephone Encounter (Signed)
ppw received will call when done to let patient know.

## 2018-12-17 ENCOUNTER — Ambulatory Visit (INDEPENDENT_AMBULATORY_CARE_PROVIDER_SITE_OTHER): Payer: Medicare Other | Admitting: Psychology

## 2018-12-17 DIAGNOSIS — F331 Major depressive disorder, recurrent, moderate: Secondary | ICD-10-CM

## 2018-12-18 NOTE — Telephone Encounter (Signed)
Pt wanted joellen to know she received a call twice for [facility] and would like to see if paperwork could get to [facility] asap

## 2018-12-18 NOTE — Telephone Encounter (Signed)
See note °

## 2018-12-21 ENCOUNTER — Encounter (INDEPENDENT_AMBULATORY_CARE_PROVIDER_SITE_OTHER): Payer: Self-pay | Admitting: Family Medicine

## 2018-12-21 ENCOUNTER — Ambulatory Visit (INDEPENDENT_AMBULATORY_CARE_PROVIDER_SITE_OTHER): Payer: Medicare Other | Admitting: Family Medicine

## 2018-12-21 VITALS — BP 121/77 | HR 62 | Ht 67.0 in | Wt 254.0 lb

## 2018-12-21 DIAGNOSIS — Z6839 Body mass index (BMI) 39.0-39.9, adult: Secondary | ICD-10-CM | POA: Diagnosis not present

## 2018-12-21 DIAGNOSIS — F3289 Other specified depressive episodes: Secondary | ICD-10-CM | POA: Diagnosis not present

## 2018-12-22 ENCOUNTER — Telehealth (INDEPENDENT_AMBULATORY_CARE_PROVIDER_SITE_OTHER): Payer: Self-pay | Admitting: Family Medicine

## 2018-12-22 MED ORDER — BUPROPION HCL ER (SR) 150 MG PO TB12: 150.0000 mg | ORAL_TABLET | Freq: Every day | ORAL | 0 refills | Status: DC

## 2018-12-22 NOTE — Telephone Encounter (Signed)
Prescription sent in to the pharmacy. Ankush Gintz, [place]

## 2018-12-22 NOTE — Progress Notes (Signed)
Office: [phone]  /  Fax: [phone]    Date: January 04, 2019   Time Seen: 12:00pm Duration: 36 minutes Provider: Glennie Isle, Psy.D. Type of Session: Individual Therapy  Type of Contact: Face-to-face  Session Content: [name] [name] is a 78 y.o. female presenting for a follow-up appointment to address the previously established treatment goal of decreasing emotional eating. The session was initiated with the administration of the PHQ-9 and GAD-7, as well as a brief check-in. [name] [name] denied experiencing suicidal ideation, plan, and intent since the last appointment. She discussed focusing on "getting into a retirement community." She explained she has signed up for a retirement home, but demonstrated ambivalence about the move, as evidenced by her stating, "I love my home." [name] [name]'s ambivalence was processed. [name] [name] shared, "I don't think I'll back out. I called a decorator I know." Regarding eating, [name] [name] shared, "I had a binge a few nights ago. I ate 12 popsicles." This was explored, and [name] [name] acknowledged she did not have anything else to eat that day. Thus, physical versus emotional hunger was reviewed. Moreover, she shared she experienced emotional hunger yesterday, but denied engaging in emotional eating. [name] [name] noted a belief that her eating habits will improve once she moves to the retirement home, as she will be provided with cooked meals. Regarding the referral previously placed, [name] [name] shared she initiated therapeutic services last week. She noted her next appointment is on January 07, 2019. Furthermore, [name] [name] shared she discontinued Wellbutrin as it was "not helping." This provider encouraged her to inform Dr. Leafy Ro; [name] [name] agreed. [name] [name] was receptive to today's session as evidenced by openness to sharing, responsiveness to feedback, and willingness to process her ambivalence.  Mental Status Examination: [name] [name] arrived on time for the appointment. She presented as appropriately  dressed and groomed. [name] [name] appeared her stated age and demonstrated adequate orientation to time, place, person, and purpose of the appointment. She also demonstrated appropriate eye contact. No psychomotor abnormalities or behavioral peculiarities noted. Her mood was euthymic with congruent affect. Her thought processes were logical, linear, and goal-directed. No hallucinations, delusions, bizarre thinking or behavior reported or observed. Judgment, insight, and impulse control appeared to be grossly intact. There was no evidence of paraphasias (i.e., errors in speech, gross mispronunciations, and word substitutions), repetition deficits, or disturbances in volume or prosody (i.e., rhythm and intonation). There was no evidence of attention or memory impairments. [name] [name] denied current suicidal and homicidal ideation, plan and intent.   Structured Assessment Results: The Patient Health Questionnaire-9 (PHQ-9) is a self-report measure that assesses symptoms and severity of depression over the course of the last two weeks. [name] [name] obtained a score of 11 suggesting moderate depression. [name] [name] finds the endorsed symptoms to be somewhat difficult. Depression screen PHQ 2/9 01/04/2019  Decreased Interest 2  Down, Depressed, Hopeless 1  PHQ - 2 Score 3  Altered sleeping 0  Tired, decreased energy 3  Change in appetite 3  Feeling bad or failure about yourself  2  Trouble concentrating 0  Moving slowly or fidgety/restless 0  Suicidal thoughts 0  PHQ-9 Score 11  Difficult doing work/chores -  Some recent data might be hidden   The Generalized Anxiety Disorder-7 (GAD-7) is a brief self-report measure that assesses symptoms of anxiety over the course of the last two weeks. [name] [name] obtained a score of 10 suggesting moderate anxiety. GAD 7 : Generalized Anxiety Score 01/04/2019  Nervous, Anxious, on Edge 1  Control/stop worrying 3  Worry too much - different things 2  Trouble relaxing 3  Restless 0  Easily  annoyed or irritable 1  Afraid - awful might happen 0  Total GAD 7 Score 10  Anxiety Difficulty Somewhat difficult   Interventions:  Administration of PHQ-9 and GAD-7 for symptom monitoring Empathic reflections and validation Processing thoughts and feelings Reviewed learned skills Brief chart review  DSM-5 Diagnosis: 296.32 (F33.1) Major Depressive Disorder, Recurrent Episode, Moderate, With Anxious Distress,Mild  Treatment Goal & Progress: During the initial appointment with this provider, the following treatment goal was established: decrease emotional eating. Overall, [name] [name] has demonstrated limited progress in her goal as evidenced by continued emotional eating and chart review. Nevertheless, she has demonstrated increased awareness of hunger patterns and triggers for emotional eating. [name] [name] reported a plan to continue working on her eating habits with the clinic, as well as her new therapist.   Plan: Today was [name] [name]'s last appointment with this provider. She indicated she initiated longer-term therapeutic services based on the referral placed by this provider. [name] [name] noted her next appointment with her new therapist is on January 07, 2019.

## 2018-12-22 NOTE — Telephone Encounter (Signed)
PPW received placed in your box for review.

## 2018-12-22 NOTE — Telephone Encounter (Signed)
Pt states Dr. Leafy Ro was going to send in RX for Wellbutrin yesterday; pharmacy has not received RX. Please call to [facility] on [address]. drh

## 2018-12-22 NOTE — Telephone Encounter (Signed)
Completed and given to Tillie Rung to call patient. Need to confirm answers.

## 2018-12-22 NOTE — Progress Notes (Signed)
Office: [phone]  /  Fax: [phone]   HPI:   Chief Complaint: OBESITY [name] [name] is here to discuss her progress with her obesity treatment plan. She is on the portion control better and make smarter food choices, such as increase vegetables and decrease simple carbohydrates and is following her eating plan approximately 20 % of the time. She states she is exercising 0 minutes 0 times per week. [name] [name] has had a lot of stressors in her life, especially over the holidays. She notes her motivation to change has decreased.  Her weight is 254 lb (115.2 kg) today and has gained 3 pounds since her last visit. She has lost 13 lbs since starting treatment with [place].  Depression with emotional eating behaviors [name] [name] is on Zoloft and is seeing Dr. Mallie Mussel. She notes decreased motivation and has decreased looking forward to her future. 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 She feels it is not as bad now. [name] [name] struggles with emotional eating and using food for comfort to the extent that it is negatively impacting her health. She often snacks when she is not hungry. [name] [name] sometimes feels she is out of control and then feels guilty that she made poor food choices. She has been working on behavior modification techniques to help reduce her emotional eating and has been somewhat successful. She shows no sign of suicidal or homicidal ideations.  Depression screen [name] 2/9 12/17/2018 12/03/2018 11/05/2018 10/20/2018 09/16/2018  Decreased Interest _0 0  Down, Depressed, Hopeless _1 PHQ - 2 Score _2 Altered sleeping 0 0 0 0 1  Tired, decreased energy _3 Change in appetite _4 Feeling bad or failure about yourself  _5 Trouble concentrating 0 1 0 0 0  Moving slowly or fidgety/restless 0 1 0 0 0  Suicidal thoughts 1 1 0 0 0  PHQ-9 Score _6 Difficult doing work/chores - - - - -  Some recent data might be hidden    ASSESSMENT  AND PLAN:  Other depression - with emotional eating - Plan: buPROPion (WELLBUTRIN SR) 150 MG 12 hr tablet  Class 2 severe obesity with serious comorbidity and body mass index (BMI) of 39.0 to 39.9 in adult, unspecified obesity type (HCC)  PLAN:  Depression with Emotional Eating Behaviors We discussed behavior modification techniques today to help [name] [name] deal with her emotional eating and depression. [name] [name] agrees to continue taking Zoloft and she agrees to start Wellbutrin SR 150 mg q AM #30 with no refills, and will monitor closely. [name] [name] agrees to follow up with our clinic in 2 to 3 weeks with myself or Jake Bathe, FNP.  I spent > than 50% of the 25 minute visit on counseling as documented in the note.  Obesity [name] [name] is currently in the action stage of change. As such, her goal is to maintain weight for now She has agreed to portion control better and make smarter food choices, such as increase vegetables and decrease simple carbohydrates  [name] [name] has been instructed to work up to a goal of 150 minutes of combined cardio and strengthening exercise per week for weight loss and overall health benefits. We discussed the following Behavioral Modification Strategies today: increasing lean protein intake and decreasing simple carbohydrates  Jazara will work on maintaining her weight until depression improves.  [name] [name]  has agreed to follow up with our clinic in 2 to 3 weeks with myself or Jake Bathe, FNP. She was informed of the importance of frequent follow up visits to maximize her success with intensive lifestyle modifications for her multiple health conditions.  ALLERGIES: Allergies  Allergen Reactions  . Ancef [Cefazolin] Hives and Itching  . Pseudoephedrine Hcl     Other reaction(s): palpitations (moderate)  . Ergotamine   . Pentobarbital   . Caffeine Palpitations    MEDICATIONS: Current Outpatient Medications on File Prior to Visit  Medication Sig Dispense Refill  .  apixaban (ELIQUIS) 5 MG TABS tablet Take 1 tablet (5 mg total) by mouth 2 (two) times daily. 180 tablet 1  . Biotin 5000 MCG CAPS Take 5,000 mcg by mouth daily.     . Calcium Carbonate-Vit D-Min (CALCIUM 1200 PO) Take 2,400 mg by mouth daily.     . cholecalciferol (VITAMIN D) 1000 UNITS tablet Take 1,000 Units by mouth daily.     Marland Kitchen Co-Enzyme Q10 100 MG CAPS Take 100 mg by mouth daily.     Marland Kitchen Dexlansoprazole (DEXILANT) 30 MG capsule Take 1 capsule (30 mg total) by mouth daily. 30 capsule 2  . diazepam (VALIUM) 5 MG tablet Take 1 tablet po daily 30 tablet 1  . diclofenac sodium (VOLTAREN) 1 % GEL Apply 2 g topically 4 (four) times daily as needed. 200 g 3  . fluticasone (FLONASE) 50 MCG/ACT nasal spray Place 2 sprays into both nostrils daily. 16 g 6  . folic acid (FOLVITE) 440 MCG tablet Take 400 mcg by mouth daily.    Marland Kitchen glucosamine-chondroitin 500-400 MG tablet Take 1 tablet by mouth daily.     . Glycopyrrolate-Formoterol (BEVESPI AEROSPHERE) 9-4.8 MCG/ACT AERO Inhale 2 puffs into the lungs 2 (two) times daily. 1 Inhaler 0  . loratadine (KLS ALLERCLEAR) 10 MG tablet Take 10 mg by mouth daily.    Marland Kitchen MAGNESIUM CITRATE PO Take 250 mg by mouth daily. 1 tab daily    . metoprolol succinate (TOPROL-XL) 50 MG 24 hr tablet Take one half tablet by mouth twice a day with food    . nitroGLYCERIN (NITROSTAT) 0.4 MG SL tablet Place 0.4 mg under the tongue every 5 (five) minutes as needed for chest pain.    Marland Kitchen OVER THE COUNTER MEDICATION CBD oil    . Resveratrol 250 MG CAPS Take 250 mg by mouth daily.    . sertraline (ZOLOFT) 100 MG tablet TAKE 1 TABLET BY MOUTH ONCE DAILY 90 tablet 0  . Simethicone (GAS-X PO) Take 2 tablets by mouth daily as needed (bloating).    . Spacer/Aero-Holding Chambers (AEROCHAMBER MV) inhaler Use as instructed 1 each 0  . sucralfate (CARAFATE) 1 g tablet Take 1 tablet (1 g total) by mouth 4 (four) times daily -  with meals and at bedtime. 60 tablet 1  . Triamcinolone Acetonide ([employer]  NASAL ALLERGY SPRAY NA) Place into the nose.     No current facility-administered medications on file prior to visit.     PAST MEDICAL HISTORY: Past Medical History:  Diagnosis Date  . Alpha-1-antitrypsin deficiency carrier   . Arthritis    "all over"  . Atrial fibrillation ([place])   . Back pain   . Complication of anesthesia    "I woke up during 2 different procedures" (10/04/2014)  . Frequent UTI   . GERD (gastroesophageal reflux disease)   . Hypertension   . MVP (mitral valve prolapse)   . Obesity   .  Osteoarthritis   . Presence of permanent cardiac pacemaker   . PVC's (premature ventricular contractions)   . Shortness of breath   . 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 Sleep apnea   . Stroke [facility]) 01/2014   denies deficits on 10/04/2014  . Tachy-brady syndrome ([place]) 10/2014  . TIA (transient ischemic attack)     PAST SURGICAL HISTORY: Past Surgical History:  Procedure Laterality Date  . CARDIOVERSION N/A 09/02/2017   Procedure: CARDIOVERSION;  Surgeon: Sanda Klein, MD;  Location: [facility] ENDOSCOPY;  Service: Cardiovascular;  Laterality: N/A;  . EXCISION VAGINAL CYST     benign nodules  . INSERT / REPLACE / REMOVE PACEMAKER  10/04/2014   Medtronic Advisa model [id number] serial number [id number]  . LOOP RECORDER EXPLANT N/A 10/04/2014   Procedure: LOOP RECORDER EXPLANT;  Surgeon: Sanda Klein, MD;  Location: [place] CATH LAB;  Service: Cardiovascular;  Laterality: N/A;  . LOOP RECORDER IMPLANT N/A 04/19/2014   Procedure: LOOP RECORDER IMPLANT;  Surgeon: Sanda Klein, MD;  Location: [place] CATH LAB;  Service: Cardiovascular;  Laterality: N/A;  . NM MYOCAR PERF WALL MOTION  12/18/2007   normal  . PERMANENT PACEMAKER INSERTION N/A 10/04/2014   Procedure: PERMANENT PACEMAKER INSERTION;  Surgeon: Sanda Klein, MD;  Location: [place] CATH LAB;  Service: Cardiovascular;  Laterality: N/A;  . TUBAL LIGATION    . US ECHOCARDIOGRAPHY  05/15/2010   LA  mildly dilated,mild mitral annular ca+, AOV mildly sclerotic, mild asymmetric LVH    SOCIAL HISTORY: Social History   Tobacco Use  . Smoking status: Former Smoker    Packs/day: 2.00    Years: 18.00    Pack years: 36.00    Types: Cigarettes  . Smokeless tobacco: Never Used  . Tobacco comment: "quit smoking in 1971"  Substance Use Topics  . Alcohol use: Yes    Alcohol/week: 0.0 standard drinks    Comment: 10/04/2014 "might have a drink a couple times/yr"  . Drug use: No    FAMILY HISTORY: Family History  Problem Relation Age of Onset  . Heart attack Mother   . Sudden death Mother   . Depression Mother   . Stroke Father   . Heart failure Father   . Depression Father   . Heart failure Brother   . Stroke Brother   . Alpha-1 antitrypsin deficiency Daughter     ROS: Review of Systems  Constitutional: Negative for weight loss.  Psychiatric/Behavioral: Positive for depression. Negative for suicidal ideas.    PHYSICAL EXAM: Blood pressure 121/77, pulse 62, height _0  (1.702 m), weight 254 lb (115.2 kg), SpO2 95 %. Body mass index is 39.78 kg/m. Physical Exam Vitals signs reviewed.  Constitutional:      Appearance: Normal appearance. She is obese.  Cardiovascular:     Rate and Rhythm: Normal rate.     Pulses: Normal pulses.  Pulmonary:     Effort: Pulmonary effort is normal.     Breath sounds: Normal breath sounds.  Musculoskeletal: Normal range of motion.  Skin:    General: Skin is warm and dry.  Neurological:     Mental Status: She is alert and oriented to person, place, and time.  Psychiatric:        Mood and Affect: Mood normal.        Behavior: Behavior normal.     RECENT LABS AND TESTS: BMET    Component Value Date/Time   NA 137 10/14/2018 1028   NA 142  09/22/2018 1242   K 4.4 10/14/2018 1028   CL 104 10/14/2018 1028   CO2 30 10/14/2018 1028   GLUCOSE 111 (H) 10/14/2018 1028   BUN 29 (H) 10/14/2018 1028   BUN 19 09/22/2018 1242   CREATININE  0.99 10/14/2018 1028   CREATININE 0.86 09/28/2014 1557   CALCIUM 9.0 10/14/2018 1028   GFRNONAA 55 (L) 09/22/2018 1242   GFRNONAA 56 (L) 06/06/2014 1653   GFRAA 64 09/22/2018 1242   GFRAA 65 06/06/2014 1653   Lab Results  Component Value Date   HGBA1C 5.8 (H) 09/22/2018   HGBA1C 5.8 (H) 06/03/2018   HGBA1C 5.8 (H) 12/24/2017   HGBA1C 6.2 (H) 09/17/2017   HGBA1C 5.9 (H) 03/09/2014   Lab Results  Component Value Date   INSULIN 24.2 09/22/2018   INSULIN 17.6 06/03/2018   INSULIN 19.4 12/24/2017   INSULIN 47.7 (H) 09/17/2017   CBC    Component Value Date/Time   WBC 7.6 10/14/2018 1028   RBC 4.86 10/14/2018 1028   HGB 14.2 10/14/2018 1028   HGB 14.9 09/17/2017 1131   HCT 42.2 10/14/2018 1028   HCT 45.1 09/17/2017 1131   PLT 258.0 10/14/2018 1028   PLT 258 08/27/2017 1509   MCV 86.8 10/14/2018 1028   MCV 86 09/17/2017 1131   MCH 28.5 09/17/2017 1131   MCH 29.0 09/28/2014 1557   MCHC 33.6 10/14/2018 1028   RDW 12.9 10/14/2018 1028   RDW 13.5 09/17/2017 1131   LYMPHSABS 2.0 10/14/2018 1028   LYMPHSABS 2.0 09/17/2017 1131   MONOABS 1.0 10/14/2018 1028   EOSABS 0.2 10/14/2018 1028   EOSABS 0.2 09/17/2017 1131   BASOSABS 0.1 10/14/2018 1028   BASOSABS 0.0 09/17/2017 1131   Iron/TIBC/Ferritin/ %Sat No results found for: IRON, TIBC, FERRITIN, IRONPCTSAT Lipid Panel     Component Value Date/Time   CHOL 176 06/03/2018 1022   TRIG 113 06/03/2018 1022   HDL 53 06/03/2018 1022   CHOLHDL 3 01/15/2017 1417   VLDL 33.6 01/15/2017 1417   LDLCALC 100 (H) 06/03/2018 1022   LDLDIRECT 81.0 07/15/2016 1419   Hepatic Function Panel     Component Value Date/Time   PROT 7.2 10/14/2018 1028   PROT 6.5 09/22/2018 1242   ALBUMIN 3.9 10/14/2018 1028   ALBUMIN 4.0 09/22/2018 1242   AST 19 10/14/2018 1028   ALT 13 10/14/2018 1028   ALKPHOS 58 10/14/2018 1028   BILITOT 0.3 10/14/2018 1028   BILITOT 0.4 09/22/2018 1242   BILIDIR 0.1 01/15/2017 1417      Component Value  Date/Time   TSH 2.620 09/22/2018 1242   TSH 2.040 09/17/2017 1131   TSH 2.70 07/15/2016 1419      OBESITY BEHAVIORAL INTERVENTION VISIT  Today's visit was # 26   Starting weight: 267 lbs Starting date: 09/17/17 Today's weight : 254 lbs  Today's date: 12/21/2018 Total lbs lost to date: 13    ASK: We discussed the diagnosis of obesity with [name] today and Dajanee agreed to give [place] permission to discuss obesity behavioral modification therapy today.  ASSESS: Blake has the diagnosis of obesity and her BMI today is 39.77 Brizza is in the action stage of change   ADVISE: Kalany was educated on the multiple health risks of obesity as well as the benefit of weight loss to improve her health. She was advised of the need for long term treatment and the importance of lifestyle modifications to improve her current health and to decrease her risk of future health  problems.  AGREE: Multiple dietary modification options and treatment options were discussed and  Edita agreed to follow the recommendations documented in the above note.  ARRANGE: Harlyn was educated on the importance of frequent visits to treat obesity as outlined per CMS and USPSTF guidelines and agreed to schedule her next follow up appointment today.  I, Trixie Dredge, am acting as transcriptionist for Dennard Nip, MD  I have reviewed the above documentation for accuracy and completeness, and I agree with the above. -Dennard Nip, MD

## 2018-12-23 NOTE — Telephone Encounter (Signed)
Per Tillie Rung pt informed after reviewing questions with her was faxed to number provided.

## 2018-12-24 ENCOUNTER — Other Ambulatory Visit: Payer: Self-pay

## 2018-12-24 NOTE — Telephone Encounter (Deleted)
Sertraline 100 MG refilled electronically.

## 2018-12-24 NOTE — Telephone Encounter (Signed)
Faxed for sertraline received. Dr. Juleen China does not prescribe Rx.

## 2018-12-30 ENCOUNTER — Ambulatory Visit (INDEPENDENT_AMBULATORY_CARE_PROVIDER_SITE_OTHER): Payer: Medicare Other | Admitting: Licensed Clinical Social Worker

## 2018-12-30 DIAGNOSIS — F3341 Major depressive disorder, recurrent, in partial remission: Secondary | ICD-10-CM

## 2019-01-01 ENCOUNTER — Telehealth: Payer: Self-pay | Admitting: Family Medicine

## 2019-01-01 ENCOUNTER — Other Ambulatory Visit: Payer: Self-pay

## 2019-01-01 LAB — CUP PACEART INCLINIC DEVICE CHECK
Date Time Interrogation Session: 20200131150150
Implantable Lead Implant Date: 20151103
Implantable Lead Location: 753859
Implantable Lead Location: 753860
Implantable Lead Model: 5076
Implantable Pulse Generator Implant Date: 20151103
MDC IDC LEAD IMPLANT DT: 20151103

## 2019-01-01 MED ORDER — SERTRALINE HCL 100 MG PO TABS: 100.0000 mg | ORAL_TABLET | Freq: Every day | ORAL | 0 refills | Status: DC

## 2019-01-01 NOTE — Telephone Encounter (Signed)
Copied from [place]. Topic: Quick Communication - Rx Refill/Question >> Jan 01, 2019 12:02 PM Alanda Slim E wrote: Medication: sertraline (ZOLOFT) 100 MG tablet   Pt needs today- has none left   Has the patient contacted their pharmacy? Yes    Preferred Pharmacy (with phone number or street name): [place], [place] - [address] [phone] (Phone) [phone] (Fax)    Agent: Please be advised that RX refills may take up to 3 business days. We ask that you follow-up with your pharmacy.

## 2019-01-01 NOTE — Telephone Encounter (Signed)
Called in message sent in my chart.

## 2019-01-01 NOTE — Telephone Encounter (Signed)
See note °

## 2019-01-04 ENCOUNTER — Ambulatory Visit (INDEPENDENT_AMBULATORY_CARE_PROVIDER_SITE_OTHER): Payer: Medicare Other

## 2019-01-04 ENCOUNTER — Ambulatory Visit (INDEPENDENT_AMBULATORY_CARE_PROVIDER_SITE_OTHER): Payer: Medicare Other | Admitting: Psychology

## 2019-01-04 DIAGNOSIS — F331 Major depressive disorder, recurrent, moderate: Secondary | ICD-10-CM | POA: Diagnosis not present

## 2019-01-04 DIAGNOSIS — I495 Sick sinus syndrome: Secondary | ICD-10-CM | POA: Diagnosis not present

## 2019-01-06 LAB — CUP PACEART REMOTE DEVICE CHECK
Battery Remaining Longevity: 74 mo
Battery Voltage: 3.01 V
Brady Statistic AP VS Percent: 96.64 %
Brady Statistic AS VP Percent: 0 %
Brady Statistic AS VS Percent: 3.32 %
Brady Statistic RA Percent Paced: 96.61 %
Brady Statistic RV Percent Paced: 0.04 %
Date Time Interrogation Session: 20200203201723
Implantable Lead Implant Date: 20151103
Implantable Lead Location: 753859
Implantable Lead Location: 753860
Implantable Lead Model: 5076
Implantable Lead Model: 5076
Implantable Pulse Generator Implant Date: 20151103
Lead Channel Impedance Value: 399 Ohm
Lead Channel Impedance Value: 494 Ohm
Lead Channel Impedance Value: 513 Ohm
Lead Channel Pacing Threshold Amplitude: 0.5 V
Lead Channel Pacing Threshold Amplitude: 0.625 V
Lead Channel Pacing Threshold Pulse Width: 0.4 ms
Lead Channel Pacing Threshold Pulse Width: 0.4 ms
Lead Channel Sensing Intrinsic Amplitude: 3.375 mV
Lead Channel Sensing Intrinsic Amplitude: 3.375 mV
Lead Channel Sensing Intrinsic Amplitude: 6.625 mV
Lead Channel Sensing Intrinsic Amplitude: 6.625 mV
Lead Channel Setting Pacing Amplitude: 2 V
Lead Channel Setting Pacing Pulse Width: 0.4 ms
Lead Channel Setting Sensing Sensitivity: 2.8 mV
MDC IDC LEAD IMPLANT DT: 20151103
MDC IDC MSMT LEADCHNL RV IMPEDANCE VALUE: 494 Ohm
MDC IDC SET LEADCHNL RA PACING AMPLITUDE: 1.5 V
MDC IDC STAT BRADY AP VP PERCENT: 0.04 %

## 2019-01-07 ENCOUNTER — Ambulatory Visit (INDEPENDENT_AMBULATORY_CARE_PROVIDER_SITE_OTHER): Payer: Medicare Other | Admitting: Licensed Clinical Social Worker

## 2019-01-07 DIAGNOSIS — F3341 Major depressive disorder, recurrent, in partial remission: Secondary | ICD-10-CM

## 2019-01-11 ENCOUNTER — Encounter (INDEPENDENT_AMBULATORY_CARE_PROVIDER_SITE_OTHER): Payer: Self-pay | Admitting: Family Medicine

## 2019-01-11 ENCOUNTER — Ambulatory Visit (INDEPENDENT_AMBULATORY_CARE_PROVIDER_SITE_OTHER): Payer: Medicare Other | Admitting: Family Medicine

## 2019-01-11 VITALS — BP 89/57 | HR 73 | Ht 67.0 in | Wt 254.0 lb

## 2019-01-11 DIAGNOSIS — Z6839 Body mass index (BMI) 39.0-39.9, adult: Secondary | ICD-10-CM

## 2019-01-11 DIAGNOSIS — F3289 Other specified depressive episodes: Secondary | ICD-10-CM | POA: Diagnosis not present

## 2019-01-12 NOTE — Progress Notes (Signed)
Remote pacemaker transmission.   

## 2019-01-12 NOTE — Progress Notes (Signed)
Office: [phone]  /  Fax: [phone]   HPI:   Chief Complaint: OBESITY [name] [name] is here to discuss her progress with her obesity treatment plan. She is on the portion control better and make smarter food choices, such as increase vegetables and decrease simple carbohydrates and is following her eating plan approximately 10 % of the time. She states she is in the pool for 30 minutes 1 times per week. [name] [name] is selling her home and is under a lot of stress. She is isn't concentrating on weight loss, but has sone well maintaining her weight. She will be moving to a senior center where she has some friends.  Her weight is 254 lb (115.2 kg) today and has not lost weight since her last visit. She has lost 13 lbs since starting treatment with [place].  Depression with emotional eating behaviors [name] [name] started Wellbutrin but states she felt fuzzy-headed so she stopped. She is still stressed about moving and using food for comfort. [name] [name] struggles with emotional eating and using food for comfort to the extent that it is negatively impacting her health. She often snacks when she is not hungry. [name] [name] sometimes feels she is out of control and then feels guilty that she made poor food choices. She has been working on behavior modification techniques to help reduce her emotional eating and has been somewhat successful. She shows no sign of suicidal or homicidal ideations.  Depression screen [facility] 2/9 01/04/2019 12/17/2018 12/03/2018 11/05/2018 10/20/2018  Decreased Interest _0 Down, Depressed, Hopeless _1 PHQ - 2 Score _2 Altered sleeping 0 0 0 0 0  Tired, decreased energy _3 Change in appetite _4 Feeling bad or failure about yourself  _5 Trouble concentrating 0 0 1 0 0  Moving slowly or fidgety/restless 0 0 1 0 0  Suicidal thoughts 0 1 1 0 0  PHQ-9 Score _6 Difficult doing work/chores - - - - -  Some recent data might be hidden    ASSESSMENT  AND PLAN:  Other depression - with emotional eating  Class 2 severe obesity with serious comorbidity and body mass index (BMI) of 39.0 to 39.9 in adult, unspecified obesity type (HCC)  PLAN:  Depression with Emotional Eating Behaviors [name] [name] agrees to discontinue Wellbutrin. We discussed cognitive behavioral therapy to help [name] [name] decrease stress, emotional eating. [name] [name] agrees to follow up with our clinic in 3 weeks.  I spent > than 50% of the 25 minute visit on counseling as documented in the note.  Obesity [name] [name] is currently in the action stage of change. As such, her goal is to maintain weight for now She has agreed to portion control better and make smarter food choices, such as increase vegetables and decrease simple carbohydrates  [name] [name] has been instructed to work up to a goal of 150 minutes of combined cardio and strengthening exercise per week for weight loss and overall health benefits. We discussed the following Behavioral Modification Strategies today: increasing lean protein intake, decreasing simple carbohydrates  and emotional eating strategies   [name] [name] has agreed to follow up with our clinic in 3 weeks. She was informed of the importance of frequent follow up visits to maximize her success with intensive lifestyle modifications for her multiple health conditions.  ALLERGIES: Allergies  Allergen Reactions  . Ancef [Cefazolin] Hives and  Itching  . Pseudoephedrine Hcl     Other reaction(s): palpitations (moderate)  . Ergotamine   . Pentobarbital   . Caffeine Palpitations    MEDICATIONS: Current Outpatient Medications on File Prior to Visit  Medication Sig Dispense Refill  . apixaban (ELIQUIS) 5 MG TABS tablet Take 1 tablet (5 mg total) by mouth 2 (two) times daily. 180 tablet 1  . Biotin 5000 MCG CAPS Take 5,000 mcg by mouth daily.     . Calcium Carbonate-Vit D-Min (CALCIUM 1200 PO) Take 2,400 mg by mouth daily.     . cholecalciferol (VITAMIN D) 1000 UNITS  tablet Take 1,000 Units by mouth daily.     Marland Kitchen Co-Enzyme Q10 100 MG CAPS Take 100 mg by mouth daily.     Marland Kitchen Dexlansoprazole (DEXILANT) 30 MG capsule Take 1 capsule (30 mg total) by mouth daily. 30 capsule 2  . diazepam (VALIUM) 5 MG tablet Take 1 tablet po daily 30 tablet 1  . diclofenac sodium (VOLTAREN) 1 % GEL Apply 2 g topically 4 (four) times daily as needed. 200 g 3  . fluticasone (FLONASE) 50 MCG/ACT nasal spray Place 2 sprays into both nostrils daily. 16 g 6  . folic acid (FOLVITE) 269 MCG tablet Take 400 mcg by mouth daily.    Marland Kitchen glucosamine-chondroitin 500-400 MG tablet Take 1 tablet by mouth daily.     . Glycopyrrolate-Formoterol (BEVESPI AEROSPHERE) 9-4.8 MCG/ACT AERO Inhale 2 puffs into the lungs 2 (two) times daily. 1 Inhaler 0  . loratadine (KLS ALLERCLEAR) 10 MG tablet Take 10 mg by mouth daily.    Marland Kitchen MAGNESIUM CITRATE PO Take 250 mg by mouth daily. 1 tab daily    . metoprolol succinate (TOPROL-XL) 50 MG 24 hr tablet Take one half tablet by mouth twice a day with food    . nitroGLYCERIN (NITROSTAT) 0.4 MG SL tablet Place 0.4 mg under the tongue every 5 (five) minutes as needed for chest pain.    Marland Kitchen OVER THE COUNTER MEDICATION CBD oil    . Resveratrol 250 MG CAPS Take 250 mg by mouth daily.    . sertraline (ZOLOFT) 100 MG tablet Take 1 tablet (100 mg total) by mouth daily. 90 tablet 0  . Simethicone (GAS-X PO) Take 2 tablets by mouth daily as needed (bloating).    . Spacer/Aero-Holding Chambers (AEROCHAMBER MV) inhaler Use as instructed 1 each 0  . sucralfate (CARAFATE) 1 g tablet Take 1 tablet (1 g total) by mouth 4 (four) times daily -  with meals and at bedtime. 60 tablet 1  . Triamcinolone Acetonide ([employer] NASAL ALLERGY SPRAY NA) Place into the nose.     No current facility-administered medications on file prior to visit.     PAST MEDICAL HISTORY: Past Medical History:  Diagnosis Date  . Alpha-1-antitrypsin deficiency carrier   . Arthritis    "all over"  . Atrial  fibrillation ([place])   . Back pain   . Complication of anesthesia    "I woke up during 2 different procedures" (10/04/2014)  . Frequent UTI   . GERD (gastroesophageal reflux disease)   . Hypertension   . MVP (mitral valve prolapse)   . Obesity   . Osteoarthritis   . Presence of permanent cardiac pacemaker   . PVC's (premature ventricular contractions)   . Shortness of breath   . 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 Sleep apnea   . Stroke [facility]) 01/2014   denies deficits on  10/04/2014  . Tachy-brady syndrome ([place]) 10/2014  . TIA (transient ischemic attack)     PAST SURGICAL HISTORY: Past Surgical History:  Procedure Laterality Date  . CARDIOVERSION N/A 09/02/2017   Procedure: CARDIOVERSION;  Surgeon: Sanda Klein, MD;  Location: [facility] ENDOSCOPY;  Service: Cardiovascular;  Laterality: N/A;  . EXCISION VAGINAL CYST     benign nodules  . INSERT / REPLACE / REMOVE PACEMAKER  10/04/2014   Medtronic Advisa model [id number] serial number [id number]  . LOOP RECORDER EXPLANT N/A 10/04/2014   Procedure: LOOP RECORDER EXPLANT;  Surgeon: Sanda Klein, MD;  Location: [place] CATH LAB;  Service: Cardiovascular;  Laterality: N/A;  . LOOP RECORDER IMPLANT N/A 04/19/2014   Procedure: LOOP RECORDER IMPLANT;  Surgeon: Sanda Klein, MD;  Location: [place] CATH LAB;  Service: Cardiovascular;  Laterality: N/A;  . NM MYOCAR PERF WALL MOTION  12/18/2007   normal  . PERMANENT PACEMAKER INSERTION N/A 10/04/2014   Procedure: PERMANENT PACEMAKER INSERTION;  Surgeon: Sanda Klein, MD;  Location: [place] CATH LAB;  Service: Cardiovascular;  Laterality: N/A;  . TUBAL LIGATION    . US ECHOCARDIOGRAPHY  05/15/2010   LA mildly dilated,mild mitral annular ca+, AOV mildly sclerotic, mild asymmetric LVH    SOCIAL HISTORY: Social History   Tobacco Use  . Smoking status: Former Smoker    Packs/day: 2.00    Years: 18.00    Pack years: 36.00    Types: Cigarettes  . Smokeless tobacco:  Never Used  . Tobacco comment: "quit smoking in 1971"  Substance Use Topics  . Alcohol use: Yes    Alcohol/week: 0.0 standard drinks    Comment: 10/04/2014 "might have a drink a couple times/yr"  . Drug use: No    FAMILY HISTORY: Family History  Problem Relation Age of Onset  . Heart attack Mother   . Sudden death Mother   . Depression Mother   . Stroke Father   . Heart failure Father   . Depression Father   . Heart failure Brother   . Stroke Brother   . Alpha-1 antitrypsin deficiency Daughter     ROS: Review of Systems  Constitutional: Negative for weight loss.  Psychiatric/Behavioral: Positive for depression. Negative for suicidal ideas.    PHYSICAL EXAM: Blood pressure (!) 89/57, pulse 73, height _0  (1.702 m), weight 254 lb (115.2 kg), SpO2 96 %. Body mass index is 39.78 kg/m. Physical Exam Vitals signs reviewed.  Constitutional:      Appearance: Normal appearance. She is obese.  Cardiovascular:     Rate and Rhythm: Normal rate.     Pulses: Normal pulses.  Pulmonary:     Effort: Pulmonary effort is normal.     Breath sounds: Normal breath sounds.  Musculoskeletal: Normal range of motion.  Skin:    General: Skin is warm and dry.  Neurological:     Mental Status: She is alert and oriented to person, place, and time.  Psychiatric:        Mood and Affect: Mood normal.        Behavior: Behavior normal.     RECENT LABS AND TESTS: BMET    Component Value Date/Time   NA 137 10/14/2018 1028   NA 142 09/22/2018 1242   K 4.4 10/14/2018 1028   CL 104 10/14/2018 1028   CO2 30 10/14/2018 1028   GLUCOSE 111 (H) 10/14/2018 1028   BUN 29 (H) 10/14/2018 1028   BUN 19 09/22/2018 1242   CREATININE 0.99 10/14/2018 1028   CREATININE 0.86 09/28/2014 1557  CALCIUM 9.0 10/14/2018 1028   GFRNONAA 55 (L) 09/22/2018 1242   GFRNONAA 56 (L) 06/06/2014 1653   GFRAA 64 09/22/2018 1242   GFRAA 65 06/06/2014 1653   Lab Results  Component Value Date   HGBA1C 5.8 (H)  09/22/2018   HGBA1C 5.8 (H) 06/03/2018   HGBA1C 5.8 (H) 12/24/2017   HGBA1C 6.2 (H) 09/17/2017   HGBA1C 5.9 (H) 03/09/2014   Lab Results  Component Value Date   INSULIN 24.2 09/22/2018   INSULIN 17.6 06/03/2018   INSULIN 19.4 12/24/2017   INSULIN 47.7 (H) 09/17/2017   CBC    Component Value Date/Time   WBC 7.6 10/14/2018 1028   RBC 4.86 10/14/2018 1028   HGB 14.2 10/14/2018 1028   HGB 14.9 09/17/2017 1131   HCT 42.2 10/14/2018 1028   HCT 45.1 09/17/2017 1131   PLT 258.0 10/14/2018 1028   PLT 258 08/27/2017 1509   MCV 86.8 10/14/2018 1028   MCV 86 09/17/2017 1131   MCH 28.5 09/17/2017 1131   MCH 29.0 09/28/2014 1557   MCHC 33.6 10/14/2018 1028   RDW 12.9 10/14/2018 1028   RDW 13.5 09/17/2017 1131   LYMPHSABS 2.0 10/14/2018 1028   LYMPHSABS 2.0 09/17/2017 1131   MONOABS 1.0 10/14/2018 1028   EOSABS 0.2 10/14/2018 1028   EOSABS 0.2 09/17/2017 1131   BASOSABS 0.1 10/14/2018 1028   BASOSABS 0.0 09/17/2017 1131   Iron/TIBC/Ferritin/ %Sat No results found for: IRON, TIBC, FERRITIN, IRONPCTSAT Lipid Panel     Component Value Date/Time   CHOL 176 06/03/2018 1022   TRIG 113 06/03/2018 1022   HDL 53 06/03/2018 1022   CHOLHDL 3 01/15/2017 1417   VLDL 33.6 01/15/2017 1417   LDLCALC 100 (H) 06/03/2018 1022   LDLDIRECT 81.0 07/15/2016 1419   Hepatic Function Panel     Component Value Date/Time   PROT 7.2 10/14/2018 1028   PROT 6.5 09/22/2018 1242   ALBUMIN 3.9 10/14/2018 1028   ALBUMIN 4.0 09/22/2018 1242   AST 19 10/14/2018 1028   ALT 13 10/14/2018 1028   ALKPHOS 58 10/14/2018 1028   BILITOT 0.3 10/14/2018 1028   BILITOT 0.4 09/22/2018 1242   BILIDIR 0.1 01/15/2017 1417      Component Value Date/Time   TSH 2.620 09/22/2018 1242   TSH 2.040 09/17/2017 1131   TSH 2.70 07/15/2016 1419      OBESITY BEHAVIORAL INTERVENTION VISIT  Today's visit was # 27  Starting weight: 267 lbs Starting date: 09/17/17 Today's weight : 254 lbs  Today's date:  01/11/2019 Total lbs lost to date: 13    ASK: We discussed the diagnosis of obesity with [name] [name] today and [name] [name] agreed to give [place] permission to discuss obesity behavioral modification therapy today.  ASSESS: [name] [name] has the diagnosis of obesity and her BMI today is 39.77 [name] [name] is in the action stage of change   ADVISE: [name] [name] was educated on the multiple health risks of obesity as well as the benefit of weight loss to improve her health. She was advised of the need for long term treatment and the importance of lifestyle modifications to improve her current health and to decrease her risk of future health problems.  AGREE: Multiple dietary modification options and treatment options were discussed and  [name] [name] agreed to follow the recommendations documented in the above note.  ARRANGE: [name] [name] was educated on the importance of frequent visits to treat obesity as outlined per CMS and USPSTF guidelines and agreed to schedule her next follow up appointment  today.  I, Trixie Dredge, am acting as transcriptionist for Dennard Nip, MD  I have reviewed the above documentation for accuracy and completeness, and I agree with the above. -Dennard Nip, MD

## 2019-01-20 ENCOUNTER — Telehealth: Payer: Self-pay | Admitting: Internal Medicine

## 2019-01-20 NOTE — Telephone Encounter (Signed)
Patient I schedule tomorrow for apt question about handicap letter and medications. Nothing further needed at this time.

## 2019-01-21 ENCOUNTER — Encounter: Payer: Self-pay | Admitting: Internal Medicine

## 2019-01-21 ENCOUNTER — Ambulatory Visit (INDEPENDENT_AMBULATORY_CARE_PROVIDER_SITE_OTHER): Payer: Medicare Other | Admitting: Internal Medicine

## 2019-01-21 VITALS — BP 136/74 | HR 85 | Ht 66.0 in | Wt 261.0 lb

## 2019-01-21 DIAGNOSIS — J8410 Pulmonary fibrosis, unspecified: Secondary | ICD-10-CM

## 2019-01-21 DIAGNOSIS — Z9989 Dependence on other enabling machines and devices: Secondary | ICD-10-CM | POA: Diagnosis not present

## 2019-01-21 DIAGNOSIS — Z6841 Body Mass Index (BMI) 40.0 and over, adult: Secondary | ICD-10-CM

## 2019-01-21 DIAGNOSIS — G4733 Obstructive sleep apnea (adult) (pediatric): Secondary | ICD-10-CM

## 2019-01-21 DIAGNOSIS — J449 Chronic obstructive pulmonary disease, unspecified: Secondary | ICD-10-CM | POA: Diagnosis not present

## 2019-01-21 NOTE — Progress Notes (Signed)
HPI  female former smoker followed for dyspnea on exertion, lung nodules, fibrosis, complicated by A. 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 She does not qualify for portable oxygen CT chest 01/06/17-pulmonary fibrosis, small nodules ANA 1:160 ------------------------------------------------------------------------------------------ 09/21/2018- 78 year old female former smoker followed for dyspnea on exertion, lung nodules, fibrosis, ANA 0:539, complicated by A. 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 Feeling Sob with activity today. Previously completed pulmonary rehab. Body weight 250 lbs No acute changes in her breathing.  Notices a full/bloated feeling especially after meals, but little wheeze or cough.  Still followed by cardiology for A. Fib/pacemaker. Gradually adjusting to loss of daughter who died from end-stage lung disease related to her alpha-1 antitrypsin status. CT chest 04/03/2018- IMPRESSION: 1. Pulmonary nodules are stable to slightly decreased back to 01/06/2017 chest CT, considered benign. 2. 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 No frank honeycombing. No convincing progression since 01/06/2017 high-resolution chest CT study. 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 Usual interstitial pneumonia is unlikely given absence of progression and presence of air trapping. 3. Stable mild patchy air trapping in both lungs indicative of small airways disease. Aortic Atherosclerosis (ICD10-I70.0).  01/21/2019- 78 year old female former  smoker followed for dyspnea on exertion, lung nodules, Fibrosis, ANA 7:673, complicated by A. 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 -----COPD: breathing has been a little worse but under more stress recently - selling house, gained some weight.  Not sure if breathing related to lungs or heart Body weight today 261 pounds Bevespi, Asencion Islam, Moving to [facility], where there will be exercise opportunities. We discussed ILD concern. CXR 09/21/2018- No active lung disease. Stable chronic change and mild cardiomegaly with permanent pacemaker.  ROS-see HPI   + = positive Constitutional:    weight loss, night sweats, fevers, chills, fatigue, lassitude. HEENT:    headaches, difficulty swallowing, tooth/dental problems, sore throat,       sneezing, itching, ear ache, nasal congestion, post nasal drip, snoring CV:    chest pain, orthopnea, PND, swelling in lower extremities, anasarca,                                                dizziness, palpitations Resp:   +shortness of breath with exertion or at rest.                +productive cough,   non-productive cough, coughing up of blood.              change in color of mucus.  wheezing.   Skin:    rash or lesions. GI:  No-   heartburn, indigestion, abdominal pain, nausea, vomiting, diarrhea,                 change in bowel habits, loss of appetite GU: dysuria, change in color of urine, no urgency or frequency.   flank pain. MS:   joint pain, stiffness, decreased range of motion, back pain. Neuro-     nothing  unusual Psych:  change in mood or affect.  depression or anxiety.   memory loss.  OBJ- Physical Exam General- Alert, Oriented, Affect-appropriate, Distress- none acute, + obese Skin- rash-none, lesions- none, excoriation- none Lymphadenopathy- none Head- atraumatic            Eyes- Gross vision intact, PERRLA, conjunctivae and secretions clear            Ears- Hearing,  canals-normal            Nose- Clear, no-Septal dev, mucus, polyps, erosion, perforation             Throat- Mallampati II , mucosa clear , drainage- none, tonsils- atrophic Neck- flexible , trachea midline, no stridor , thyroid nl, carotid no bruit Chest - symmetrical excursion , unlabored           Heart/CV- RRR , no murmur , no gallop  , no rub, nl s1 s2                           - JVD- none , edema- none, stasis changes- none, varices- none           Lung- + clear, wheeze- none, cough+ slight dry, dullness-none, rub- none           Chest wall- +Pacemaker Left Abd-  Br/ Gen/ Rectal- Not done, not indicated Extrem- cyanosis- none, clubbing, none, atrophy- none, strength- nl Neuro- grossly intact to observation

## 2019-01-21 NOTE — Patient Instructions (Signed)
Anticipate CT chest in [place] as planned   Order- schedule PFT  Please call if we can help

## 2019-01-22 ENCOUNTER — Other Ambulatory Visit: Payer: Self-pay | Admitting: Family Medicine

## 2019-01-22 DIAGNOSIS — R1084 Generalized abdominal pain: Secondary | ICD-10-CM

## 2019-01-22 DIAGNOSIS — R195 Other fecal abnormalities: Secondary | ICD-10-CM

## 2019-01-25 NOTE — Assessment & Plan Note (Signed)
She hopes her upcoming move will put her in an environment more conducive to an exercise habit and weight loss.

## 2019-01-25 NOTE — Assessment & Plan Note (Signed)
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  The key issue is to recognize progression, especially development towards a UIP pattern Plan-schedule PFT, proceed with CT chest as planned

## 2019-01-25 NOTE — Assessment & Plan Note (Signed)
She reports compliant use of CPAP.  This problem is followed by cardiology.

## 2019-01-27 ENCOUNTER — Ambulatory Visit: Payer: Self-pay | Admitting: Licensed Clinical Social Worker

## 2019-01-28 ENCOUNTER — Ambulatory Visit: Payer: Medicare Other

## 2019-02-01 ENCOUNTER — Ambulatory Visit (INDEPENDENT_AMBULATORY_CARE_PROVIDER_SITE_OTHER): Payer: Medicare Other | Admitting: Family Medicine

## 2019-02-01 ENCOUNTER — Encounter (INDEPENDENT_AMBULATORY_CARE_PROVIDER_SITE_OTHER): Payer: Self-pay | Admitting: Family Medicine

## 2019-02-01 VITALS — BP 102/71 | HR 81 | Ht 66.0 in | Wt 257.0 lb

## 2019-02-01 DIAGNOSIS — F3289 Other specified depressive episodes: Secondary | ICD-10-CM

## 2019-02-01 DIAGNOSIS — Z6841 Body Mass Index (BMI) 40.0 and over, adult: Secondary | ICD-10-CM

## 2019-02-01 MED ORDER — TOPIRAMATE 25 MG PO TABS: 25.0000 mg | ORAL_TABLET | Freq: Every day | ORAL | 0 refills | Status: DC

## 2019-02-01 NOTE — Progress Notes (Signed)
Office: [phone]  /  Fax: [phone]   HPI:   Chief Complaint: OBESITY [name] [name] is here to discuss her progress with her obesity treatment plan. She is on the portion control better and make smarter food choices plan and she is following her eating plan approximately 35 % of the time. She states she is exercising 0 minutes 0 times per week. [name] [name] has struggled to stay on track. [name] [name] is preoccupied with selling her home and she is not concentrating on weight loss. [name] [name] is frustrated with herself and she is doing a lot of comfort eating. Her weight is 257 lb (116.6 kg) today and has had a weight gain of 3 pounds over a period of 3 weeks since her last visit. She has lost 10 lbs since starting treatment with [place].  Depression with emotional eating behaviors [name] [name] is on Zoloft but she is under a lot of stress and she is doing more comfort eating, even though it is hurting her health. She sees someone at [facility]. [name] [name] struggles with emotional eating and using food for comfort to the extent that it is negatively impacting her health. She often snacks when she is not hungry. [name] [name] sometimes feels she is out of control and then feels guilty that she made poor food choices. She has been working on behavior modification techniques to help reduce her emotional eating and has been somewhat successful. She shows no sign of suicidal or homicidal ideations.  Depression screen [facility] 2/9 01/04/2019 12/17/2018 12/03/2018 11/05/2018 10/20/2018  Decreased Interest _0 Down, Depressed, Hopeless _1 PHQ - 2 Score _2 Altered sleeping 0 0 0 0 0  Tired, decreased energy _3 Change in appetite _4 Feeling bad or failure about yourself  _5 Trouble concentrating 0 0 1 0 0  Moving slowly or fidgety/restless 0 0 1 0 0  Suicidal thoughts 0 1 1 0 0  PHQ-9 Score _6 Difficult doing work/chores - - - - -  Some recent data might be hidden     ASSESSMENT AND PLAN:  Other depression - with emotional eating - Plan: topiramate (TOPAMAX) 25 MG tablet  Class 3 severe obesity with serious comorbidity and body mass index (BMI) of 40.0 to 44.9 in adult, unspecified obesity type (HCC)  PLAN:  Depression with Emotional Eating Behaviors We discussed behavior modification techniques today to help [name] [name] deal with her emotional eating and depression. She agreed to continue Zoloft and start Topamax 25 mg qHS #30 with no refills and follow up  With her therapist. [name] [name] agreed to follow up with our clinic in 3 weeks.  I spent > than 50% of the 25 minute visit on counseling as documented in the note.  Obesity [name] [name] is currently in the action stage of change. As such, her goal is to continue with weight loss efforts She has agreed to follow the Category 2 plan [name] [name] has been instructed to work up to a goal of 150 minutes of combined cardio and strengthening exercise per week for weight loss and overall health benefits. We discussed the following Behavioral Modification Strategies today: increasing lean protein intake, decrease eating out and emotional eating strategies  [name] [name] has agreed to follow up with our clinic in 3 weeks. She was informed of the importance of frequent follow up visits to maximize  her success with intensive lifestyle modifications for her multiple health conditions.  ALLERGIES: Allergies  Allergen Reactions  . Ancef [Cefazolin] Hives and Itching  . Pseudoephedrine Hcl     Other reaction(s): palpitations (moderate)  . Ergotamine   . Pentobarbital   . Caffeine Palpitations    MEDICATIONS: Current Outpatient Medications on File Prior to Visit  Medication Sig Dispense Refill  . apixaban (ELIQUIS) 5 MG TABS tablet Take 1 tablet (5 mg total) by mouth 2 (two) times daily. 180 tablet 1  . Biotin 5000 MCG CAPS Take 5,000 mcg by mouth daily.     . Calcium Carbonate-Vit D-Min (CALCIUM 1200 PO) Take 2,400 mg by mouth  daily.     . cholecalciferol (VITAMIN D) 1000 UNITS tablet Take 1,000 Units by mouth daily.     Marland Kitchen Co-Enzyme Q10 100 MG CAPS Take 100 mg by mouth daily.     Marland Kitchen DEXILANT 30 MG capsule TAKE 1 CAPSULE BY MOUTH ONCE DAILY 30 capsule 0  . diazepam (VALIUM) 5 MG tablet Take 1 tablet po daily 30 tablet 1  . diclofenac sodium (VOLTAREN) 1 % GEL Apply 2 g topically 4 (four) times daily as needed. 200 g 3  . fluticasone (FLONASE) 50 MCG/ACT nasal spray Place 2 sprays into both nostrils daily. 16 g 6  . folic acid (FOLVITE) 867 MCG tablet Take 400 mcg by mouth daily.    Marland Kitchen glucosamine-chondroitin 500-400 MG tablet Take 1 tablet by mouth daily.     . Glycopyrrolate-Formoterol (BEVESPI AEROSPHERE) 9-4.8 MCG/ACT AERO Inhale 2 puffs into the lungs 2 (two) times daily. 1 Inhaler 0  . loratadine (KLS ALLERCLEAR) 10 MG tablet Take 10 mg by mouth daily.    Marland Kitchen MAGNESIUM CITRATE PO Take 250 mg by mouth daily. 1 tab daily    . metoprolol succinate (TOPROL-XL) 50 MG 24 hr tablet Take one half tablet by mouth twice a day with food    . nitroGLYCERIN (NITROSTAT) 0.4 MG SL tablet Place 0.4 mg under the tongue every 5 (five) minutes as needed for chest pain.    Marland Kitchen OVER THE COUNTER MEDICATION CBD oil    . Resveratrol 250 MG CAPS Take 250 mg by mouth daily.    . sertraline (ZOLOFT) 100 MG tablet Take 1 tablet (100 mg total) by mouth daily. 90 tablet 0  . Simethicone (GAS-X PO) Take 2 tablets by mouth daily as needed (bloating).    . Spacer/Aero-Holding Chambers (AEROCHAMBER MV) inhaler Use as instructed 1 each 0  . sucralfate (CARAFATE) 1 g tablet Take 1 tablet (1 g total) by mouth 4 (four) times daily -  with meals and at bedtime. 60 tablet 1  . Triamcinolone Acetonide ([employer] NASAL ALLERGY SPRAY NA) Place into the nose.     No current facility-administered medications on file prior to visit.     PAST MEDICAL HISTORY: Past Medical History:  Diagnosis Date  . Alpha-1-antitrypsin deficiency carrier   . Arthritis    "all  over"  . Atrial fibrillation ([place])   . Back pain   . Complication of anesthesia    "I woke up during 2 different procedures" (10/04/2014)  . Frequent UTI   . GERD (gastroesophageal reflux disease)   . Hypertension   . MVP (mitral valve prolapse)   . Obesity   . Osteoarthritis   . Presence of permanent cardiac pacemaker   . PVC's (premature ventricular contractions)   . Shortness of breath   . Sinus arrest 10/2014   s/p Medtronic Advisa  model [id number] serial number [id number]  . Sleep apnea   . Stroke [facility]) 01/2014   denies deficits on 10/04/2014  . Tachy-brady syndrome ([place]) 10/2014  . TIA (transient ischemic attack)     PAST SURGICAL HISTORY: Past Surgical History:  Procedure Laterality Date  . CARDIOVERSION N/A 09/02/2017   Procedure: CARDIOVERSION;  Surgeon: Sanda Klein, MD;  Location: [facility] ENDOSCOPY;  Service: Cardiovascular;  Laterality: N/A;  . EXCISION VAGINAL CYST     benign nodules  . INSERT / REPLACE / REMOVE PACEMAKER  10/04/2014   Medtronic Advisa model [id number] serial number [id number]  . LOOP RECORDER EXPLANT N/A 10/04/2014   Procedure: LOOP RECORDER EXPLANT;  Surgeon: Sanda Klein, MD;  Location: [place] CATH LAB;  Service: Cardiovascular;  Laterality: N/A;  . LOOP RECORDER IMPLANT N/A 04/19/2014   Procedure: LOOP RECORDER IMPLANT;  Surgeon: Sanda Klein, MD;  Location: [place] CATH LAB;  Service: Cardiovascular;  Laterality: N/A;  . NM MYOCAR PERF WALL MOTION  12/18/2007   normal  . PERMANENT PACEMAKER INSERTION N/A 10/04/2014   Procedure: PERMANENT PACEMAKER INSERTION;  Surgeon: Sanda Klein, MD;  Location: [place] CATH LAB;  Service: Cardiovascular;  Laterality: N/A;  . TUBAL LIGATION    . US ECHOCARDIOGRAPHY  05/15/2010   LA mildly dilated,mild mitral annular ca+, AOV mildly sclerotic, mild asymmetric LVH    SOCIAL HISTORY: Social History   Tobacco Use  . Smoking status: Former Smoker    Packs/day: 2.00    Years: 18.00    Pack years: 36.00    Types: Cigarettes  .  Smokeless tobacco: Never Used  . Tobacco comment: "quit smoking in 1971"  Substance Use Topics  . Alcohol use: Yes    Alcohol/week: 0.0 standard drinks    Comment: 10/04/2014 "might have a drink a couple times/yr"  . Drug use: No    FAMILY HISTORY: Family History  Problem Relation Age of Onset  . Heart attack Mother   . Sudden death Mother   . Depression Mother   . Stroke Father   . Heart failure Father   . Depression Father   . Heart failure Brother   . Stroke Brother   . Alpha-1 antitrypsin deficiency Daughter     ROS: Review of Systems  Constitutional: Negative for weight loss.  Psychiatric/Behavioral: Positive for depression. Negative for suicidal ideas.    PHYSICAL EXAM: Blood pressure 102/71, pulse 81, height _0  (1.676 m), weight 257 lb (116.6 kg), SpO2 96 %. Body mass index is 41.48 kg/m. Physical Exam Vitals signs reviewed.  Constitutional:      Appearance: Normal appearance. She is well-developed. She is obese.  Cardiovascular:     Rate and Rhythm: Normal rate.  Pulmonary:     Effort: Pulmonary effort is normal.  Musculoskeletal: Normal range of motion.  Skin:    General: Skin is warm and dry.  Neurological:     Mental Status: She is alert and oriented to person, place, and time.  Psychiatric:        Mood and Affect: Mood normal.        Behavior: Behavior normal.        Thought Content: Thought content does not include homicidal or suicidal ideation.     RECENT LABS AND TESTS: BMET    Component Value Date/Time   NA 137 10/14/2018 1028   NA 142 09/22/2018 1242   K 4.4 10/14/2018 1028   CL 104 10/14/2018 1028   CO2 30 10/14/2018 1028   GLUCOSE 111 (H) 10/14/2018 1028  BUN 29 (H) 10/14/2018 1028   BUN 19 09/22/2018 1242   CREATININE 0.99 10/14/2018 1028   CREATININE 0.86 09/28/2014 1557   CALCIUM 9.0 10/14/2018 1028   GFRNONAA 55 (L) 09/22/2018 1242   GFRNONAA 56 (L) 06/06/2014 1653   GFRAA 64 09/22/2018 1242   GFRAA 65 06/06/2014 1653     Lab Results  Component Value Date   HGBA1C 5.8 (H) 09/22/2018   HGBA1C 5.8 (H) 06/03/2018   HGBA1C 5.8 (H) 12/24/2017   HGBA1C 6.2 (H) 09/17/2017   HGBA1C 5.9 (H) 03/09/2014   Lab Results  Component Value Date   INSULIN 24.2 09/22/2018   INSULIN 17.6 06/03/2018   INSULIN 19.4 12/24/2017   INSULIN 47.7 (H) 09/17/2017   CBC    Component Value Date/Time   WBC 7.6 10/14/2018 1028   RBC 4.86 10/14/2018 1028   HGB 14.2 10/14/2018 1028   HGB 14.9 09/17/2017 1131   HCT 42.2 10/14/2018 1028   HCT 45.1 09/17/2017 1131   PLT 258.0 10/14/2018 1028   PLT 258 08/27/2017 1509   MCV 86.8 10/14/2018 1028   MCV 86 09/17/2017 1131   MCH 28.5 09/17/2017 1131   MCH 29.0 09/28/2014 1557   MCHC 33.6 10/14/2018 1028   RDW 12.9 10/14/2018 1028   RDW 13.5 09/17/2017 1131   LYMPHSABS 2.0 10/14/2018 1028   LYMPHSABS 2.0 09/17/2017 1131   MONOABS 1.0 10/14/2018 1028   EOSABS 0.2 10/14/2018 1028   EOSABS 0.2 09/17/2017 1131   BASOSABS 0.1 10/14/2018 1028   BASOSABS 0.0 09/17/2017 1131   Iron/TIBC/Ferritin/ %Sat No results found for: IRON, TIBC, FERRITIN, IRONPCTSAT Lipid Panel     Component Value Date/Time   CHOL 176 06/03/2018 1022   TRIG 113 06/03/2018 1022   HDL 53 06/03/2018 1022   CHOLHDL 3 01/15/2017 1417   VLDL 33.6 01/15/2017 1417   LDLCALC 100 (H) 06/03/2018 1022   LDLDIRECT 81.0 07/15/2016 1419   Hepatic Function Panel     Component Value Date/Time   PROT 7.2 10/14/2018 1028   PROT 6.5 09/22/2018 1242   ALBUMIN 3.9 10/14/2018 1028   ALBUMIN 4.0 09/22/2018 1242   AST 19 10/14/2018 1028   ALT 13 10/14/2018 1028   ALKPHOS 58 10/14/2018 1028   BILITOT 0.3 10/14/2018 1028   BILITOT 0.4 09/22/2018 1242   BILIDIR 0.1 01/15/2017 1417      Component Value Date/Time   TSH 2.620 09/22/2018 1242   TSH 2.040 09/17/2017 1131   TSH 2.70 07/15/2016 1419     Ref. Range 06/03/2018 10:22  Vitamin D, 25-Hydroxy Latest Ref Range: 30.0 - 100.0 ng/mL 53.1    OBESITY BEHAVIORAL  INTERVENTION VISIT  Today's visit was # 28   Starting weight: 267 lbs Starting date: 09/17/2017 Today's weight : 257 lbs Today's date: 02/01/2019 Total lbs lost to date: 10    02/01/2019  Height _0  (1.676 m)  Weight 257 lb (116.6 kg)  BMI (Calculated) 41.5  BLOOD PRESSURE - SYSTOLIC 778  BLOOD PRESSURE - DIASTOLIC 71   Body Fat % 24.2 %  Total Body Water (lbs) 83.6 lbs    ASK: We discussed the diagnosis of obesity with [name] [name] today and [name] [name] agreed to give [place] permission to discuss obesity behavioral modification therapy today.  ASSESS: [name] [name] has the diagnosis of obesity and her BMI today is 41.5 [name] [name] is in the action stage of change   ADVISE: [name] [name] was educated on the multiple health risks of obesity as well as the benefit of  weight loss to improve her health. She was advised of the need for long term treatment and the importance of lifestyle modifications to improve her current health and to decrease her risk of future health problems.  AGREE: Multiple dietary modification options and treatment options were discussed and  [name] [name] agreed to follow the recommendations documented in the above note.  ARRANGE: [name] [name] was educated on the importance of frequent visits to treat obesity as outlined per CMS and USPSTF guidelines and agreed to schedule her next follow up appointment today.  I, Doreene Nest, am acting as transcriptionist for Dennard Nip, MD  I have reviewed the above documentation for accuracy and completeness, and I agree with the above. -Dennard Nip, MD

## 2019-02-02 ENCOUNTER — Ambulatory Visit (INDEPENDENT_AMBULATORY_CARE_PROVIDER_SITE_OTHER): Payer: Medicare Other | Admitting: Licensed Clinical Social Worker

## 2019-02-02 ENCOUNTER — Ambulatory Visit: Payer: Medicare Other | Admitting: Licensed Clinical Social Worker

## 2019-02-02 DIAGNOSIS — F3341 Major depressive disorder, recurrent, in partial remission: Secondary | ICD-10-CM | POA: Diagnosis not present

## 2019-02-02 DIAGNOSIS — K219 Gastro-esophageal reflux disease without esophagitis: Secondary | ICD-10-CM | POA: Diagnosis not present

## 2019-02-02 DIAGNOSIS — R1310 Dysphagia, unspecified: Secondary | ICD-10-CM | POA: Diagnosis not present

## 2019-02-03 ENCOUNTER — Telehealth: Payer: Self-pay

## 2019-02-03 NOTE — Telephone Encounter (Signed)
   [facility] Pre-operative Risk Assessment    Request for surgical clearance:  1. What type of surgery is being performed? EGD  2. When is this surgery scheduled? 02/11/19  3. What type of clearance is required (medical clearance vs. Pharmacy clearance to hold med vs. Both)?  Pharmacy  4. Are there any medications that need to be held prior to surgery and how long? Eliquis  5. Practice name and name of physician performing surgery? [facility]   6. What is your office phone number  [phone]   7.   What is your office fax number  [phone]  8.   Anesthesia type  Not listed   [name] 02/03/2019, 2:09 PM  _________________________________________________________________   (provider comments below)

## 2019-02-03 NOTE — Telephone Encounter (Signed)
Pt takes Eliquis for afib with CHADS2VASc score of 8 (age x2, sex, CHF, HTN, CAD, TIA and stroke). Renal function is normal. Recommend only holding Eliquis for 1 day prior to procedure due to elevated cardiac risk.

## 2019-02-08 ENCOUNTER — Other Ambulatory Visit: Payer: Self-pay | Admitting: Pharmacist Clinician (PhC)/ Clinical Pharmacy Specialist

## 2019-02-08 MED ORDER — APIXABAN 5 MG PO TABS: 5.0000 mg | ORAL_TABLET | Freq: Two times a day (BID) | ORAL | 1 refills | Status: DC

## 2019-02-08 NOTE — Telephone Encounter (Signed)
  Dr [facility] office needs formed faxed stating instructions regarding clearance. Fax # [phone]

## 2019-02-08 NOTE — Telephone Encounter (Signed)
[name] [name] per pharmacy may stop Eliquis for procedure.  Her last dose would be evening of 02/09/19, none on the 11th of March, and none on the 12th until after the procedure or when Dr. Earlean Shawl feels it is safe.  Please note these dates are for planned procedure  02/11/19 only.

## 2019-02-09 ENCOUNTER — Other Ambulatory Visit: Payer: Self-pay

## 2019-02-09 DIAGNOSIS — L821 Other seborrheic keratosis: Secondary | ICD-10-CM | POA: Diagnosis not present

## 2019-02-09 DIAGNOSIS — D2262 Melanocytic nevi of left upper limb, including shoulder: Secondary | ICD-10-CM | POA: Diagnosis not present

## 2019-02-09 DIAGNOSIS — L820 Inflamed seborrheic keratosis: Secondary | ICD-10-CM | POA: Diagnosis not present

## 2019-02-09 MED ORDER — APIXABAN 5 MG PO TABS: 5.0000 mg | ORAL_TABLET | Freq: Two times a day (BID) | ORAL | 1 refills | Status: DC

## 2019-02-10 DIAGNOSIS — Z1231 Encounter for screening mammogram for malignant neoplasm of breast: Secondary | ICD-10-CM | POA: Diagnosis not present

## 2019-02-10 DIAGNOSIS — Z803 Family history of malignant neoplasm of breast: Secondary | ICD-10-CM | POA: Diagnosis not present

## 2019-02-10 LAB — HM MAMMOGRAPHY

## 2019-02-11 ENCOUNTER — Encounter: Payer: Self-pay | Admitting: Family Medicine

## 2019-02-11 DIAGNOSIS — K222 Esophageal obstruction: Secondary | ICD-10-CM | POA: Diagnosis not present

## 2019-02-11 DIAGNOSIS — K228 Other specified diseases of esophagus: Secondary | ICD-10-CM | POA: Diagnosis not present

## 2019-02-11 DIAGNOSIS — R1310 Dysphagia, unspecified: Secondary | ICD-10-CM | POA: Diagnosis not present

## 2019-02-19 ENCOUNTER — Ambulatory Visit: Payer: Medicare Other | Admitting: Family Medicine

## 2019-02-22 ENCOUNTER — Ambulatory Visit (INDEPENDENT_AMBULATORY_CARE_PROVIDER_SITE_OTHER): Payer: Self-pay | Admitting: Family Medicine

## 2019-02-24 ENCOUNTER — Ambulatory Visit: Payer: Medicare Other | Admitting: Licensed Clinical Social Worker

## 2019-02-24 ENCOUNTER — Encounter (INDEPENDENT_AMBULATORY_CARE_PROVIDER_SITE_OTHER): Payer: Self-pay

## 2019-03-01 ENCOUNTER — Other Ambulatory Visit: Payer: Self-pay | Admitting: Family Medicine

## 2019-03-02 ENCOUNTER — Other Ambulatory Visit: Payer: Self-pay | Admitting: Family Medicine

## 2019-03-03 MED ORDER — FLUTICASONE PROPIONATE 50 MCG/ACT NA SUSP: 2.0000 | Freq: Every day | NASAL | 1 refills | Status: DC

## 2019-03-05 ENCOUNTER — Encounter: Payer: Self-pay | Admitting: Physical Therapy

## 2019-03-05 DIAGNOSIS — K228 Other specified diseases of esophagus: Secondary | ICD-10-CM | POA: Insufficient documentation

## 2019-03-05 DIAGNOSIS — K2289 Other specified disease of esophagus: Secondary | ICD-10-CM | POA: Insufficient documentation

## 2019-03-12 ENCOUNTER — Inpatient Hospital Stay: Admission: RE | Admit: 2019-03-12 | Payer: Self-pay | Source: Ambulatory Visit

## 2019-03-16 DIAGNOSIS — R1310 Dysphagia, unspecified: Secondary | ICD-10-CM | POA: Diagnosis not present

## 2019-03-16 DIAGNOSIS — K219 Gastro-esophageal reflux disease without esophagitis: Secondary | ICD-10-CM | POA: Diagnosis not present

## 2019-03-16 DIAGNOSIS — K222 Esophageal obstruction: Secondary | ICD-10-CM | POA: Diagnosis not present

## 2019-03-24 ENCOUNTER — Ambulatory Visit (INDEPENDENT_AMBULATORY_CARE_PROVIDER_SITE_OTHER): Payer: Medicare Other | Admitting: Licensed Clinical Social Worker

## 2019-03-24 DIAGNOSIS — F3341 Major depressive disorder, recurrent, in partial remission: Secondary | ICD-10-CM | POA: Diagnosis not present

## 2019-03-25 ENCOUNTER — Ambulatory Visit: Payer: Medicare Other | Admitting: Internal Medicine

## 2019-03-30 NOTE — Progress Notes (Signed)
Virtual Visit via Video   Due to the COVID-19 pandemic, this visit was completed with telemedicine (audio/video) technology to reduce patient and provider exposure as well as to preserve personal protective equipment.   I connected with [name] [name] by a video enabled telemedicine application and verified that I am speaking with the correct person using two identifiers. Location patient: Home Location provider: [facility], Office Persons participating in the virtual visit: Thresia, Ramanathan, DO Lonell Grandchild, CMA acting as scribe for Dr. Briscoe Deutscher.   I discussed the limitations of evaluation and management by telemedicine and the availability of in person appointments. The patient expressed understanding and agreed to proceed.  Care Team   Patient Care Team: Briscoe Deutscher, DO as PCP - General (Family Medicine) Sanda Klein, MD as PCP - Cardiology (Cardiology) Maisie Fus, MD as Consulting Physician (Obstetrics and Gynecology) Richmond Campbell, MD as Consulting Physician (Gastroenterology) Croitoru, Dani Gobble, MD as Consulting Physician (Cardiology) Kathie Rhodes, MD as Consulting Physician (Urology) Deneise Lever, MD as Consulting Physician (Pulmonary Disease) Danella Sensing, MD as Consulting Physician (Dermatology) Starlyn Skeans, MD as Consulting Physician (Family Medicine)  Subjective:   HPI: Patient in for medications follow up. Sheltering in place, lonely. Asks if it is okay for her to go to her son-in-law's house a few times per week to take his dog for a walk. We reviewed her risk factors of the exposure to Covid-19.  She will is going to try working on getting more exercise.  She has had increased issues with breathing. She had an appointment for PFTs and Chest CT to follow up lung nodules.   Pulmonary fibrosis ([place]), followed by Pulmonolgy Followed by Dr. 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 She has a long-standing history of obstructive sleep apnea but uses CPAP. 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  CT Chest 01/06/2017: Mild basilar predominant subpleural fibrosis, new or progressive from 07/19/2008. Differential diagnosis includes nonspecific interstitial pneumonitis as well as early/mild usual interstitial pneumonitis.  PAF (paroxysmal atrial fibrillation) ([place]) She has evidence of atherosclerosis of the aorta. She does not have angina pectoris. Nuclear stress test in January 2018 was normal/low risk. Her echocardiogram in April 2015 showed evidence of diastolic dysfunction without elevated filling pressures. Similar findings on echo in September 2017 with EF 50-55% and grade 1 diastolic dysfunction without elevated filling pressures. She has not had clinically evident congestive heart failure in the past. 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  Current Outpatient Medications:  .  apixaban (ELIQUIS) 5 MG TABS tablet, Take 1 tablet (5 mg total) by mouth 2 (two) times daily. .  nitroGLYCERIN (NITROSTAT) 0.4 MG SL tablet, Place 0.4 mg under the tongue every 5 (five) minutes as needed for chest pain.  Multiple lung nodules CT chest 02/24/2017: Scattered pulmonary nodules measure up to 8 mm. Non-contrast CT chest 04/03/2018: Stable to slightly decreased, considered benign by radiology.  Was due for repeat CT 04/2019 but pushed back due to COVID-19.   Review of Systems  Constitutional: Negative for chills and fever.  HENT: Negative for hearing loss.   Eyes: Negative for blurred vision and  double vision.  Respiratory: Positive for  shortness of breath. Negative for cough.   Cardiovascular: Negative for chest pain and palpitations.  Gastrointestinal: Negative for heartburn.  Genitourinary: Negative for dysuria.  Musculoskeletal: Negative for myalgias.  Skin: Negative for rash.  Neurological: Negative for dizziness and headaches.  Endo/Heme/Allergies: Does not bruise/bleed easily.  Psychiatric/Behavioral: Positive for depression.    Patient Active Problem List   Diagnosis Date Noted  . 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 ASCVD (arteriosclerotic cardiovascular disease) risk score 18.9% 11/22/2018  . Depression 10/15/2018  . Seasonal allergies, using Flonase and Loratadine prn 10/15/2018  . Atherosclerosis of aorta ([place]) 10/15/2018  . Diastolic dysfunction 50/27/7412  . 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 Vitamin D deficiency 09/17/2017  . Class 3 severe obesity with serious comorbidity and body mass index (BMI) of 40.0 to 44.9 in adult ([place]) 09/17/2017  . Hypertriglyceridemia 09/17/2017  . Multiple lung nodules 05/23/2017  . Pulmonary fibrosis ([place]), followed by Pulmonolgy 05/23/2017  . Abnormal CT scan of lung 01/07/2017  . GERD (gastroesophageal reflux disease) 12/12/2016  . Dyspnea on exertion 12/10/2016  . Anxiety state 07/15/2016  . OSA on CPAP, compliant 05/04/2016  . Pacemaker 10/04/2014  . Long term current use of anticoagulant 06/27/2014  . PAF (paroxysmal atrial fibrillation) ([place]) 06/06/2014  . Tachy-brady syndrome ([place]) 06/06/2014  . TIA (transient ischemic attack) 03/08/2014  . Osteopenia 08/15/2008    Social History   Tobacco Use  . Smoking status: Former Smoker    Packs/day: 2.00    Years: 18.00    Pack years: 36.00    Types: Cigarettes  . Smokeless tobacco: Never Used  . Tobacco comment: "quit smoking in 1971"  Substance Use Topics  . Alcohol use: Yes    Alcohol/week: 0.0 standard drinks    Comment: 10/04/2014 "might have a drink a couple times/yr"    Current Outpatient Medications:  .   apixaban (ELIQUIS) 5 MG TABS tablet, Take 1 tablet (5 mg total) by mouth 2 (two) times daily., Disp: 180 tablet, Rfl: 1 .  Biotin 5000 MCG CAPS, Take 5,000 mcg by mouth daily. , Disp: , Rfl:  .  Calcium Carbonate-Vit D-Min (CALCIUM 1200 PO), Take 2,400 mg by mouth daily. , Disp: , Rfl:  .  cholecalciferol (VITAMIN D) 1000 UNITS tablet, Take 1,000 Units by mouth daily. , Disp: , Rfl:  .  Co-Enzyme Q10 100 MG CAPS, Take 100 mg by mouth daily. , Disp: , Rfl:  .  diazepam (VALIUM) 5 MG tablet, Take 1 tablet po daily, Disp: 30 tablet, Rfl: 1 .  diclofenac sodium (VOLTAREN) 1 % GEL, Apply 2 g topically 4 (four) times daily as needed., Disp: 200 g, Rfl: 3 .  fluticasone (FLONASE) 50 MCG/ACT nasal spray, Place 2 sprays into both nostrils daily., Disp: 16 g, Rfl: 1 .  folic acid (FOLVITE) 878 MCG tablet, Take 400 mcg by mouth daily., Disp: , Rfl:  .  glucosamine-chondroitin 500-400 MG tablet, Take 1 tablet by mouth daily. , Disp: , Rfl:  .  loratadine (KLS ALLERCLEAR) 10 MG tablet, Take 10 mg by mouth daily., Disp: , Rfl:  .  MAGNESIUM CITRATE PO, Take 250 mg by mouth daily. 1 tab daily, Disp: , Rfl:  .  metoprolol succinate (TOPROL-XL) 50 MG 24 hr tablet, Take one half tablet by mouth twice a day with food, Disp: , Rfl:  .  nitroGLYCERIN (NITROSTAT) 0.4 MG SL tablet, Place 0.4 mg under the tongue every 5 (five) minutes as needed for chest pain., Disp: ,  Rfl:  .  omeprazole (PRILOSEC) 20 MG capsule, Take 20 mg by mouth daily., Disp: , Rfl:  .  OVER THE COUNTER MEDICATION, CBD oil, Disp: , Rfl:  .  Resveratrol 250 MG CAPS, Take 250 mg by mouth daily., Disp: , Rfl:  .  Simethicone (GAS-X PO), Take 2 tablets by mouth daily as needed (bloating)., Disp: , Rfl:  .  sucralfate (CARAFATE) 1 g tablet, Take 1 tablet (1 g total) by mouth 4 (four) times daily -  with meals and at bedtime., Disp: 60 tablet, Rfl: 1 .  Triamcinolone Acetonide ([employer] NASAL ALLERGY SPRAY NA), Place into the nose., Disp: , Rfl:  .   sertraline (ZOLOFT) 100 MG tablet, Take 1 tablet by mouth once daily, Disp: 90 tablet, Rfl: 0  Allergies  Allergen Reactions  . Ancef [Cefazolin] Hives and Itching  . Pseudoephedrine Hcl     Other reaction(s): palpitations (moderate)  . Ergotamine   . Pentobarbital   . Caffeine Palpitations    Objective:   VITALS: Per patient if applicable, see vitals. GENERAL: Alert, appears well and in no acute distress. HEENT: Atraumatic, conjunctiva clear, no obvious abnormalities on inspection of external nose and ears. NECK: Normal movements of the head and neck. CARDIOPULMONARY: No increased WOB. Speaking in clear sentences. I:E ratio WNL.  MS: Moves all visible extremities without noticeable abnormality. PSYCH: Pleasant and cooperative, well-groomed. Speech normal rate and rhythm. Affect is appropriate. Insight and judgement are appropriate. Attention is focused, linear, and appropriate.  NEURO: CN grossly intact. Oriented as arrived to appointment on time with no prompting. Moves both UE equally.  SKIN: No obvious lesions, wounds, erythema, or cyanosis noted on face or hands.  Depression screen [facility] 2/9 04/01/2019 01/04/2019 12/17/2018  Decreased Interest 0 2 1  Down, Depressed, Hopeless _0 PHQ - 2 Score _1 Altered sleeping 0 0 0  Tired, decreased energy 0 3 3  Change in appetite 0 3 2  Feeling bad or failure about yourself  _2 Trouble concentrating 0 0 0  Moving slowly or fidgety/restless 0 0 0  Suicidal thoughts 0 0 1  PHQ-9 Score _3 Difficult doing work/chores Not difficult at all - -  Some recent data might be hidden    Assessment and Plan:   [name] [name] was seen today for follow-up.  Diagnoses and all orders for this visit:  Dyspnea on exertion Comments: Worsening. Hx of pulmonary fibrosis, lung nodules. Repeat CT and PFTs pushed back due to COVID-19. Hx of PAF, MVP, Rx Eliquis. Hx of seasonal allergies, though patient feels controlled on medications. Hx of GERD,  with recent dilation of distal esophageal stricture on found on EGD. Hx of OSA, CPAP compliant. Obvious deconditioning exacerbating this as well.   Will order labs - CBC, CMP, mag, TSH, free T4, BNP - and see if Pulmonology okay with repeat CT now.   ASCVD (arteriosclerotic cardiovascular disease) risk score 18.9%  Reactive depression  Educated About Covid-19 Virus Infection  Class 3 severe obesity with serious comorbidity and body mass index (BMI) of 40.0 to 44.9 in adult, unspecified obesity type ([facility])  OSA on CPAP, compliant  PAF (paroxysmal atrial fibrillation) ([place]), Rx Eliquis.  Comments: With diastolic dysfunction.   Seasonal allergies, using Flonase and Loratadine prn Comments: Admits to increase in allergies. Compliant with medications.   Marland Kitchen COVID-19 Education: The signs and symptoms of COVID-19 were discussed with the patient and how to seek care for  testing if needed. The importance of social distancing was discussed today. . Reviewed expectations re: course of current medical issues. . Discussed self-management of symptoms. . Outlined signs and symptoms indicating need for more acute intervention. . Patient verbalized understanding and all questions were answered. Marland Kitchen Health Maintenance issues including appropriate healthy diet, exercise, and smoking avoidance were discussed with patient. . See orders for this visit as documented in the electronic medical record.  Briscoe Deutscher, DO 04/01/2019  Records requested if needed. Time spent: 25 minutes, of which >50% was spent in obtaining information about her symptoms, reviewing her previous labs, evaluations, and treatments, counseling her about her condition (please see the discussed topics above), and developing a plan to further investigate it; she had a number of questions which I addressed.

## 2019-03-31 ENCOUNTER — Other Ambulatory Visit: Payer: Self-pay

## 2019-03-31 ENCOUNTER — Encounter: Payer: Self-pay | Admitting: Family Medicine

## 2019-03-31 ENCOUNTER — Ambulatory Visit (INDEPENDENT_AMBULATORY_CARE_PROVIDER_SITE_OTHER): Payer: Medicare Other | Admitting: Family Medicine

## 2019-03-31 VITALS — HR 60 | Ht 66.0 in | Wt 257.0 lb

## 2019-03-31 DIAGNOSIS — J302 Other seasonal allergic rhinitis: Secondary | ICD-10-CM | POA: Diagnosis not present

## 2019-03-31 DIAGNOSIS — R739 Hyperglycemia, unspecified: Secondary | ICD-10-CM

## 2019-03-31 DIAGNOSIS — R0609 Other forms of dyspnea: Secondary | ICD-10-CM

## 2019-03-31 DIAGNOSIS — I5189 Other ill-defined heart diseases: Secondary | ICD-10-CM | POA: Diagnosis not present

## 2019-03-31 DIAGNOSIS — R918 Other nonspecific abnormal finding of lung field: Secondary | ICD-10-CM | POA: Diagnosis not present

## 2019-03-31 DIAGNOSIS — I480 Paroxysmal atrial fibrillation: Secondary | ICD-10-CM | POA: Diagnosis not present

## 2019-03-31 DIAGNOSIS — J8410 Pulmonary fibrosis, unspecified: Secondary | ICD-10-CM

## 2019-03-31 DIAGNOSIS — R0602 Shortness of breath: Secondary | ICD-10-CM

## 2019-03-31 DIAGNOSIS — I2510 Atherosclerotic heart disease of native coronary artery without angina pectoris: Secondary | ICD-10-CM

## 2019-03-31 DIAGNOSIS — G4733 Obstructive sleep apnea (adult) (pediatric): Secondary | ICD-10-CM | POA: Diagnosis not present

## 2019-03-31 DIAGNOSIS — Z9989 Dependence on other enabling machines and devices: Secondary | ICD-10-CM

## 2019-03-31 DIAGNOSIS — F329 Major depressive disorder, single episode, unspecified: Secondary | ICD-10-CM

## 2019-03-31 DIAGNOSIS — Z6841 Body Mass Index (BMI) 40.0 and over, adult: Secondary | ICD-10-CM

## 2019-03-31 DIAGNOSIS — Z7189 Other specified counseling: Secondary | ICD-10-CM | POA: Diagnosis not present

## 2019-04-01 ENCOUNTER — Ambulatory Visit (INDEPENDENT_AMBULATORY_CARE_PROVIDER_SITE_OTHER): Payer: Medicare Other | Admitting: Licensed Clinical Social Worker

## 2019-04-01 ENCOUNTER — Encounter: Payer: Self-pay | Admitting: Family Medicine

## 2019-04-01 DIAGNOSIS — F3341 Major depressive disorder, recurrent, in partial remission: Secondary | ICD-10-CM | POA: Diagnosis not present

## 2019-04-01 NOTE — Assessment & Plan Note (Signed)
She has evidence of atherosclerosis of the aorta. She does not have angina pectoris. Nuclear stress test in January 2018 was normal/low risk. Her echocardiogram in April 2015 showed evidence of diastolic dysfunction without elevated filling pressures. Similar findings on echo in September 2017 with EF 50-55% and grade 1 diastolic dysfunction without elevated filling pressures. She has not had clinically evident congestive heart failure in the past. 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  Current Outpatient Medications:  .  apixaban (ELIQUIS) 5 MG TABS tablet, Take 1 tablet (5 mg total) by mouth 2 (two) times daily. .  nitroGLYCERIN (NITROSTAT) 0.4 MG SL tablet, Place 0.4 mg under the tongue every 5 (five) minutes as needed for chest pain.

## 2019-04-01 NOTE — Patient Instructions (Signed)
What is COVID-19? COVID-19 stands for "coronavirus disease 2019." It is caused by a virus called SARS-CoV-2. The virus first appeared in late 2019 and quickly spread around the world.  People with COVID-19 can have fever, cough, and trouble breathing. Problems with breathing happen when the infection affects the lungs and causes pneumonia.  Most people who get COVID-19 will not get severely ill. But some do. In many areas, leaders are telling people to stay home and away from other people. This is to try to slow the spread of the virus.  How is COVID-19 spread? The virus that causes COVID-19 mainly spreads from person to person. This usually happens when a sick person coughs, sneezes, or talks near other people. Doctors also think it is possible to get sick if you touch a surface that has the virus on it and then touch your mouth, nose, or eyes. This is similar to how the flu spreads, but the virus that causes COVID-19 spreads more easily.  From what experts know so far, the virus seems to spread most easily when people are showing symptoms. But people can spread it without having symptoms.  It is also possible for the virus to spread from a sick person to an animal, like a pet. But this seems to be uncommon. There is no evidence that a person could get the virus from a pet.  What are the symptoms of COVID-19? Symptoms usually start 4 or 5 days after a person is infected with the virus. But in some people, it can take up to 2 weeks for symptoms to appear. Some people never show symptoms at all.  When symptoms do happen, they can include:  . Fever . Dry cough . Feeling tired . Muscle aches . Trouble breathing  Although less common, some people have other symptoms, such as headache, sore throat, runny nose, or problems with their sense of smell or taste. Some have digestive problems like nausea or diarrhea.  For most people, symptoms will get better within a few weeks. But in others,  COVID-19 can lead to serious problems like pneumonia, not getting enough oxygen, heart problems, or even death. This is more common in people who are 79 years or older or have other health problems like heart disease, diabetes, lung disease, cancer, or obesity.  While children can get COVID-19, they are less likely to have severe symptoms.   What should I do if I have symptoms? If you have a fever, cough, or trouble breathing, call your doctor or nurse. They will ask about your symptoms. They might also ask about any recent travel and whether you have been around anyone who might be sick.  If your symptoms are not severe, it is best to call before you go in. They can tell you what to do and whether you need to be seen in person. Many people with only mild symptoms should stay home and avoid other people until they get better. If you do need to go to the clinic or hospital, cover your nose and mouth with cloth. This helps protect other people. The staff might also have you wait someplace away from other people.  If you are severely ill and need to go to the clinic or hospital right away, you should still call ahead. This way the staff can care for you while taking steps to protect others.  Is there a test for the virus that causes COVID-19? Yes. If your doctor or nurse suspects you have COVID-19, they  might take a swab from inside your nose, and possibly your mouth, and send it to a lab for testing. If you are coughing up mucus, they might also test a sample of the mucus. These tests can help your doctor figure out if you have COVID-19 or another illness.  In some areas, it might not be possible to test everyone who might have been exposed to the virus. If your doctor cannot test you, they might tell you to stay home, avoid other people, and call if your symptoms get worse.  There is also a blood test that can show if a person has had COVID-19 in the past. Over time, this could help experts understand  how many people were infected without knowing it. This test is not yet available everywhere. Experts are also using blood tests to study whether a person who has had COVID-19 could get it again.  How is COVID-19 treated? There is no known specific treatment for COVID-19. Many people will be able to stay home while they get better, but people with serious symptoms or other health problems might need to go to the hospital:  . Mild illness - Mild illness means you might have symptoms like fever and cough, but you do not have trouble breathing. Most people with COVID-19 have mild illness and can rest at home until they get better. This usually takes about 2 weeks, but it's not the same for everyone.  o If you are recovering from COVID-19, it's important to stay home and "self-isolate" until your doctor or nurse tells you it's safe to go back to your normal activities. Self-isolation means staying apart from other people, even the people you live with. When you can stop self-isolation will depend on how long it has been since you had symptoms, and in some cases, whether you have had a negative test (showing that the virus is no longer in your body).  . Severe illness - If you have more severe illness with trouble breathing, you might need to stay in the hospital, possibly in the intensive care unit (also called the "ICU"). While you are there, you will most likely be in a special isolation room. Only medical staff will be allowed in the room, and they will have to wear special gowns, gloves, masks, and eye protection. The doctors and nurses can monitor and support your breathing and other body functions and make you as comfortable as possible. You might need extra oxygen to help you breathe easily. If you are having a very hard time breathing, you might need to be put on a ventilator. This is a machine to help you breathe.  Can COVID-19 be prevented? There is not yet a vaccine to prevent COVID-19. But there  are things you can do to reduce your chances of getting it. These steps are a good idea for everyone, especially if you are in an area where the infection is spreading very quickly. But they are extra important for people age 35 years or older or who have other health problems. To help slow the spread of infection:  . Practice "social distancing." This means keeping people, even those who are healthy, away from each other. It is also sometimes called "physical distancing." The goal is to slow the spread of the virus that causes COVID-19.  Wendee Copp your hands with soap and water often. This is especially important after being out in public. Make sure to rub your hands with soap for at least 20 seconds,  cleaning your wrists, fingernails, and in between your fingers. Then rinse your hands and dry them with a paper towel you can throw away. If you are not near a sink, you can use a hand sanitizing gel to clean your hands. The gels with at least 60 percent alcohol work the best. But it is better to wash with soap and water if you can.  . Avoid touching your face with your hands, especially your mouth, nose, or eyes.  . Avoid traveling if you can. Some experts recommend not traveling to or from certain areas where JOINO-67 is spreading quickly. But any form of travel, especially if you spend time in crowded places like airports, increases your risk. If lots of people travel, it also makes it more likely that the virus will spread to more parts of the world.  What about face masks? When COVID-19 started to spread throughout the world, expert groups in the [place] did not recommend that most people wear a face mask for protection. That's because if healthy people buy a lot of medical masks, there won't be enough for the doctors and nurses who need them.  Washing your hands often and practicing social distancing are still the best ways to protect yourself and others. And experts still do not recommend that  people who are not health workers wear a medical mask. But the [employer] for [employer] and Prevention (CDC) does now recommend covering your face when you need to leave your house. This is mostly so that if you are sick, even if you don't have any symptoms, you are less likely to spread the infection to other people. You can use cloth or a bandana to cover your mouth and nose. There are instructions online about how to make your own mask using fabric and rubber bands.  Even if you cover your face, it's still important to stay home except when you need to make necessary trips out, like for food or medicine. And be sure to stay at least 6 feet (2 meters) away from other people when you do leave your home.  When you take your face cover off, make sure you do not touch your eyes, nose, or mouth. And wash your hands after you touch the face cover. You can wash the face cover with the rest of your laundry.  What things are NOT recommended to prevent COVID-19? There are a lot of opinions about COVID-19, including rumors about how to avoid it. But not all of this information is accurate. For example, you might have heard that you can lower your risk by using a hand dryer, rinsing out your nose with salt water, or taking antibiotics. These things do not work. There is also no evidence that taking vitamins helps.  What should I do if someone in my home has COVID-19? If someone in your home has COVID-19, there are additional things you can do to protect yourself and others:  . Keep the sick person away from others - The sick person should stay in a separate room, and use a different bathroom if possible. They should also eat in their own room. . Experts also recommend that the person stay away from pets in the house until they are better. . Have them cover their face - The sick person should cover their nose and mouth with a cloth mask when they are in the same room as other people. If they  can't use a face cover, you can help  protect yourself by covering your face when you are in the room with them. . Mitchell your hands with soap and water often (see above). . Clean often - Here are some specific things that can help: . Wear disposable gloves when you clean. It's also a good idea to wear gloves when you have to touch the sick person's laundry, dishes, utensils, or trash. . When you do the sick person's laundry, avoid letting dirty clothes or bedding touch your body. Wash your hands and clean the outside of the washer after putting in the laundry. . Regularly clean things that are touched a lot. This includes counters, bedside tables, doorknobs, computers, phones, and bathroom surfaces. . Clean things in your home with soap and water, but also use disinfectants on appropriate surfaces. Some cleaning products work well to kill bacteria, but not viruses, so it's important to check labels. The [place] (EPA) has a list of products here: TripMetro.co.za.  What should I do if there is a COVID-19 outbreak in my area? The best thing you can do to stay healthy is to wash your hands often, avoid close contact with people who are sick, and stay home as much as possible (but especially if you are sick). In addition, to help slow the spread of disease, it's important to follow any official instructions in your area about limiting contact with other people. Even if there are no confirmed cases of COVID-19 where you live, that could change in the future.  What if I feel fine but think I was exposed? If you think you were in close contact with someone with COVID-19, but you don't have any symptoms, you should "self-quarantine" at home for at least 14 days. This means staying home as much as possible, and staying least 6 feet (2 meters) away from other people in your home. Self-quarantine is  slightly different from self-isolation, which is when a person who is sick stays in a completely separate room from others.  You should also monitor yourself for any symptoms. If you develop a fever, cough, or trouble breathing, call your doctor or nurse right away.  What if I am pregnant? More information about COVID-19 and pregnancy is available separately. (See "Patient education: Coronavirus disease 2019 (COVID-19) and pregnancy (The Basics)".)  If you are pregnant and you have questions about COVID-19, talk to your doctor, nurse, or midwife.  What can I do to cope with stress and anxiety? It's normal to feel anxious or worried about COVID-19. You can take care of yourself, and your family, by trying to:  Marland Kitchen Take breaks from the news . Get regular exercise and eat healthy foods . Try to find activities that you enjoy and can do at home . Stay in touch with your friends and family members  Keep in mind that most people do not get severely ill from COVID-19. It helps to be prepared, and it's important to do what you can to lower your risk and help slow the spread of the virus. But try not to panic.  Where can I go to learn more? As we learn more about this virus, expert recommendations will continue to change. Check with your doctor or public health official to get the most updated information about how to protect yourself.  For information about COVID-19 in your area, you can call your local public health office. In the [place], this usually means your city or [employer] of [facility]. Many states  also have a "hotline" phone number you can call.  You can find more information about COVID-19 at the following websites: . [employer] for [employer] and Prevention (CDC): SeekRooms.co.uk . World Health Organization [facility]): MissExecutive.com.ee

## 2019-04-01 NOTE — Assessment & Plan Note (Signed)
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 She has a long-standing history of obstructive sleep apnea but uses CPAP. 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  CT Chest 01/06/2017: Mild basilar predominant subpleural fibrosis, new or progressive from 07/19/2008. Differential diagnosis includes nonspecific interstitial pneumonitis as well as early/mild usual interstitial pneumonitis.

## 2019-04-01 NOTE — Assessment & Plan Note (Signed)
CT chest 02/24/2017: Scattered pulmonary nodules measure up to 8 mm. Non-contrast CT chest 04/03/2018: Stable to slightly decreased, considered benign by radiology.  Was due for repeat CT 04/2019 but pushed back due to COVID-19.

## 2019-04-02 ENCOUNTER — Telehealth: Payer: Self-pay | Admitting: Family Medicine

## 2019-04-02 NOTE — Telephone Encounter (Signed)
Called pt to confirm appt. No travel No cough No fever No contact Benjie Karvonen, CMA

## 2019-04-05 ENCOUNTER — Ambulatory Visit (INDEPENDENT_AMBULATORY_CARE_PROVIDER_SITE_OTHER)
Admission: RE | Admit: 2019-04-05 | Discharge: 2019-04-05 | Disposition: A | Discharge status: Home / Self Care | Payer: Medicare Other | Source: Ambulatory Visit | Attending: Family Medicine | Admitting: Family Medicine

## 2019-04-05 ENCOUNTER — Other Ambulatory Visit: Payer: Self-pay

## 2019-04-05 DIAGNOSIS — R918 Other nonspecific abnormal finding of lung field: Secondary | ICD-10-CM | POA: Diagnosis not present

## 2019-04-05 DIAGNOSIS — R0602 Shortness of breath: Secondary | ICD-10-CM | POA: Diagnosis not present

## 2019-04-12 ENCOUNTER — Telehealth: Payer: Self-pay | Admitting: Family Medicine

## 2019-04-12 ENCOUNTER — Other Ambulatory Visit: Payer: Self-pay

## 2019-04-12 ENCOUNTER — Ambulatory Visit (INDEPENDENT_AMBULATORY_CARE_PROVIDER_SITE_OTHER): Payer: Medicare Other | Admitting: *Deleted

## 2019-04-12 ENCOUNTER — Telehealth: Payer: Self-pay | Admitting: Internal Medicine

## 2019-04-12 ENCOUNTER — Telehealth: Payer: Self-pay

## 2019-04-12 DIAGNOSIS — I480 Paroxysmal atrial fibrillation: Secondary | ICD-10-CM

## 2019-04-12 DIAGNOSIS — I495 Sick sinus syndrome: Secondary | ICD-10-CM | POA: Diagnosis not present

## 2019-04-12 NOTE — Telephone Encounter (Signed)
So sorry, not sure why I didn't send a message.   No acute cardiopulmonary disease. Mild chronic stable fibrotic change.  Several small subcentimeter mostly peripheral pulmonary nodules with the largest measuring 7 mm over the posterolateral right lower lobe. These are unchanged for greater than 2 years and therefore considered benign.  Image okay. I think that we also ordered an ECHO?

## 2019-04-12 NOTE — Telephone Encounter (Signed)
Called patient no answer no v/m phone made odd noise and call was d/c

## 2019-04-12 NOTE — Telephone Encounter (Signed)
I called to give her appt details for her Echo ( 6/2 at 10:00 ).  She would like a phone call back from Dr Juleen China regarding the results of the CT that was done last week.

## 2019-04-12 NOTE — Telephone Encounter (Signed)
Spoke with patient to remind of missed remote transmission

## 2019-04-12 NOTE — Telephone Encounter (Signed)
See note  Copied from [place] [phone]. Topic: General - Other >> Apr 12, 2019 11:47 AM Carolyn Stare wrote:  Pt would like a call back about her CT scan she had last week

## 2019-04-12 NOTE — Telephone Encounter (Signed)
Has CT chest to f/u on lung nodules. Dr. Juleen China, please review CT and advise. Thanks!

## 2019-04-12 NOTE — Telephone Encounter (Signed)
Called pt and advised. She will review results via MyChart later and call with any questions/concerns.   Forwarding to [place] to follow-up on echo order.

## 2019-04-12 NOTE — Telephone Encounter (Signed)
Pt had CT performed 5/4 which was ordered by Dr. Juleen China, pt's PCP. Called and spoke with pt letting her know this and stated to her to call PCP for results and pt verbalized understanding. Nothing further needed.

## 2019-04-15 ENCOUNTER — Ambulatory Visit (INDEPENDENT_AMBULATORY_CARE_PROVIDER_SITE_OTHER): Payer: Medicare Other | Admitting: Licensed Clinical Social Worker

## 2019-04-15 DIAGNOSIS — F3341 Major depressive disorder, recurrent, in partial remission: Secondary | ICD-10-CM | POA: Diagnosis not present

## 2019-04-15 LAB — CUP PACEART REMOTE DEVICE CHECK
Battery Remaining Longevity: 74 mo
Battery Voltage: 3.01 V
Brady Statistic AP VP Percent: 0.06 %
Brady Statistic AP VS Percent: 91.95 %
Brady Statistic AS VP Percent: 0.03 %
Brady Statistic AS VS Percent: 7.96 %
Brady Statistic RA Percent Paced: 91.8 %
Brady Statistic RV Percent Paced: 0.1 %
Date Time Interrogation Session: 20200513180028
Implantable Lead Implant Date: 20151103
Implantable Lead Implant Date: 20151103
Implantable Lead Location: 753859
Implantable Lead Location: 753860
Implantable Lead Model: 5076
Implantable Lead Model: 5076
Implantable Pulse Generator Implant Date: 20151103
Lead Channel Impedance Value: 399 Ohm
Lead Channel Impedance Value: 456 Ohm
Lead Channel Impedance Value: 475 Ohm
Lead Channel Impedance Value: 513 Ohm
Lead Channel Pacing Threshold Amplitude: 0.5 V
Lead Channel Pacing Threshold Amplitude: 0.5 V
Lead Channel Pacing Threshold Pulse Width: 0.4 ms
Lead Channel Pacing Threshold Pulse Width: 0.4 ms
Lead Channel Sensing Intrinsic Amplitude: 3.25 mV
Lead Channel Sensing Intrinsic Amplitude: 3.25 mV
Lead Channel Sensing Intrinsic Amplitude: 6.375 mV
Lead Channel Sensing Intrinsic Amplitude: 6.375 mV
Lead Channel Setting Pacing Amplitude: 1.5 V
Lead Channel Setting Pacing Amplitude: 2 V
Lead Channel Setting Pacing Pulse Width: 0.4 ms
Lead Channel Setting Sensing Sensitivity: 2.8 mV

## 2019-04-19 NOTE — Progress Notes (Signed)
Remote pacemaker transmission.   

## 2019-04-21 ENCOUNTER — Ambulatory Visit (INDEPENDENT_AMBULATORY_CARE_PROVIDER_SITE_OTHER): Payer: Medicare Other | Admitting: Licensed Clinical Social Worker

## 2019-04-21 DIAGNOSIS — F3341 Major depressive disorder, recurrent, in partial remission: Secondary | ICD-10-CM

## 2019-04-22 ENCOUNTER — Telehealth: Payer: Self-pay

## 2019-04-22 NOTE — Telephone Encounter (Signed)
Patient states she received bill from 02/19/19 she was sick and called to c/a app. Was told that she would not be charged. g

## 2019-04-22 NOTE — Telephone Encounter (Signed)
This no show was not for the date of service 02/19/19. It was from 09/22/18 and it went into bad debt due to no payment made. I have emailed charge corrections to waive this fee out of courtesy due to no other on show appointments.

## 2019-04-22 NOTE — Telephone Encounter (Signed)
Please check on the no show fee. I thought we were not charging them right now.

## 2019-04-22 NOTE — Telephone Encounter (Signed)
Called patient she was having phone issues. I have reviewed all questions with her.

## 2019-04-23 NOTE — Telephone Encounter (Signed)
Can you call pt and let her know was very concerned about it yesterday

## 2019-04-27 ENCOUNTER — Ambulatory Visit (INDEPENDENT_AMBULATORY_CARE_PROVIDER_SITE_OTHER): Payer: Medicare Other | Admitting: Licensed Clinical Social Worker

## 2019-04-27 DIAGNOSIS — F3341 Major depressive disorder, recurrent, in partial remission: Secondary | ICD-10-CM

## 2019-04-28 NOTE — Telephone Encounter (Signed)
Called and spoke with patient. She is aware this may take some time and that she may receive another statement in the meantime however, we are waiving the fee. No further action required. No need to route message to anyone, only for documentation.

## 2019-05-03 ENCOUNTER — Telehealth (HOSPITAL_COMMUNITY): Payer: Self-pay

## 2019-05-03 NOTE — Telephone Encounter (Signed)
COVID-19 Pre-Screening Questions:  . Do you currently have a fever? no (yes = cancel and refer to pcp for e-visit) . Have you recently travelled on a cruise, internationally, or to [place], [place], [place], [place], [place], or [place], [place] [employer]) ? no (yes = cancel, stay home, monitor symptoms, and contact pcp or initiate e-visit if symptoms develop) . Have you been in contact with someone that is currently pending confirmation of Covid19 testing or has been confirmed to have the [place] virus?  no (yes = cancel, stay home, away from tested individual, monitor symptoms, and contact pcp or initiate e-visit if symptoms develop) . Are you currently experiencing fatigue or cough? no (yes = pt should be prepared to have a mask placed at the time of their visit). .  . Reiterated no additional visitors. Eartha Inch no earlier than 15 minutes before appointment time. . Please bring own mask.

## 2019-05-04 ENCOUNTER — Encounter: Payer: Self-pay | Admitting: Physician Assistant

## 2019-05-04 ENCOUNTER — Ambulatory Visit (INDEPENDENT_AMBULATORY_CARE_PROVIDER_SITE_OTHER): Payer: Medicare Other | Admitting: Physician Assistant

## 2019-05-04 ENCOUNTER — Other Ambulatory Visit (HOSPITAL_COMMUNITY): Payer: Medicare Other

## 2019-05-04 VITALS — Ht 66.0 in

## 2019-05-04 DIAGNOSIS — M25571 Pain in right ankle and joints of right foot: Secondary | ICD-10-CM | POA: Diagnosis not present

## 2019-05-04 NOTE — Progress Notes (Signed)
Virtual Visit via Video   I connected with [name] [name] on 05/04/19 at  1:20 PM EDT by a video enabled telemedicine application and verified that I am speaking with the correct person using two identifiers. Location patient: Home Location provider: [facility], Office Persons participating in the virtual visit: [place], Hair PA-C, Josepha Pigg, [place] discussed the limitations of evaluation and management by telemedicine and the availability of in person appointments. The patient expressed understanding and agreed to proceed.  I, Jasper Loser, acting as a Education administrator for [employer], PA.,have documented all relevant documentation on the behalf of [name] Coke, PA,as directed by  [name] Coke, PA while in the presence of [name] [name], [name] [name].   Subjective:   HPI:   R LE pain/swelling Sx started yesterday after a fall. She denies tripping, dizziness, says that she just lost her balance. She injured her R leg, ankle and foot. There is a lot of bruising present but no visible bleeding. Pain is worse with weight bearing, she is only able to to put minimal weight on the R leg. She has been ambulating with a walker that her friend gave her. There is swelling present in R lower leg, ankle and foot. She has been trying to keep the leg elevated and icing it. She is not taken any OTC meds because they are packed up for her recent move.   She is currently on Eliquis 5 mg daily.   Denies: dizziness, lightheadedness, calf pain  She has seen Dr. Hector Shade at [place] in the past for orthopedic issues.    ROS: See pertinent positives and negatives per HPI.  Patient Active Problem List   Diagnosis Date Noted  . 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 ASCVD (arteriosclerotic cardiovascular disease) risk score 18.9% 11/22/2018  . Depression 10/15/2018  . Seasonal allergies, using Flonase and Loratadine prn 10/15/2018  . Atherosclerosis of aorta ([place]) 10/15/2018  .  Diastolic dysfunction 33/29/5188  . 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 Vitamin D deficiency 09/17/2017  . Class 3 severe obesity with serious comorbidity and body mass index (BMI) of 40.0 to 44.9 in adult ([place]) 09/17/2017  . Hypertriglyceridemia 09/17/2017  . Multiple lung nodules 05/23/2017  . Pulmonary fibrosis ([place]), followed by Pulmonolgy 05/23/2017  . Abnormal CT scan of lung 01/07/2017  . GERD (gastroesophageal reflux disease) 12/12/2016  . Dyspnea on exertion 12/10/2016  . Anxiety state 07/15/2016  . OSA on CPAP, compliant 05/04/2016  . Pacemaker 10/04/2014  . Long term current use of anticoagulant 06/27/2014  . PAF (paroxysmal atrial fibrillation) ([place]) 06/06/2014  . Tachy-brady syndrome ([place]) 06/06/2014  . TIA (transient ischemic attack) 03/08/2014  . Osteopenia 08/15/2008    Social History   Tobacco Use  . Smoking status: Former Smoker    Packs/day: 2.00    Years: 18.00    Pack years: 36.00    Types: Cigarettes  . Smokeless tobacco: Never Used  . Tobacco comment: "quit smoking in 1971"  Substance Use Topics  . Alcohol use: Yes    Alcohol/week: 0.0 standard drinks    Comment: 10/04/2014 "might have a drink a couple times/yr"    Current Outpatient Medications:  .  apixaban (ELIQUIS) 5 MG TABS tablet, Take 1 tablet (5 mg total) by mouth 2 (two) times daily., Disp: 180 tablet, Rfl: 1 .  Biotin 5000 MCG CAPS, Take 5,000 mcg by mouth daily. , Disp: , Rfl:  .  Calcium Carbonate-Vit D-Min (CALCIUM 1200 PO), Take 2,400 mg  by mouth daily. , Disp: , Rfl:  .  cholecalciferol (VITAMIN D) 1000 UNITS tablet, Take 1,000 Units by mouth daily. , Disp: , Rfl:  .  Co-Enzyme Q10 100 MG CAPS, Take 100 mg by mouth daily. , Disp: , Rfl:  .  diazepam (VALIUM) 5 MG tablet, Take 1 tablet po daily, Disp: 30 tablet, Rfl: 1 .  fluticasone (FLONASE) 50 MCG/ACT nasal spray, Place 2 sprays into both nostrils daily., Disp: 16 g, Rfl: 1 .  folic acid (FOLVITE) 194 MCG tablet, Take 400 mcg by mouth  daily., Disp: , Rfl:  .  glucosamine-chondroitin 500-400 MG tablet, Take 1 tablet by mouth daily. , Disp: , Rfl:  .  loratadine (KLS ALLERCLEAR) 10 MG tablet, Take 10 mg by mouth daily., Disp: , Rfl:  .  MAGNESIUM CITRATE PO, Take 250 mg by mouth daily. 1 tab daily, Disp: , Rfl:  .  nitroGLYCERIN (NITROSTAT) 0.4 MG SL tablet, Place 0.4 mg under the tongue every 5 (five) minutes as needed for chest pain., Disp: , Rfl:  .  omeprazole (PRILOSEC) 20 MG capsule, Take 20 mg by mouth daily., Disp: , Rfl:  .  OVER THE COUNTER MEDICATION, CBD oil, Disp: , Rfl:  .  Resveratrol 250 MG CAPS, Take 250 mg by mouth daily., Disp: , Rfl:  .  sertraline (ZOLOFT) 100 MG tablet, Take 1 tablet by mouth once daily, Disp: 90 tablet, Rfl: 0 .  Simethicone (GAS-X PO), Take 2 tablets by mouth daily as needed (bloating)., Disp: , Rfl:  .  sucralfate (CARAFATE) 1 g tablet, Take 1 tablet (1 g total) by mouth 4 (four) times daily -  with meals and at bedtime., Disp: 60 tablet, Rfl: 1 .  Triamcinolone Acetonide ([employer] NASAL ALLERGY SPRAY NA), Place into the nose., Disp: , Rfl:   Allergies  Allergen Reactions  . Ancef [Cefazolin] Hives and Itching  . Pseudoephedrine Hcl     Other reaction(s): palpitations (moderate)  . Ergotamine   . Pentobarbital   . Caffeine Palpitations    Objective:   VITALS: Per patient if applicable, see vitals. GENERAL: Alert, appears well and in no acute distress. HEENT: Atraumatic, conjunctiva clear, no obvious abnormalities on inspection of external nose and ears. NECK: Normal movements of the head and neck. CARDIOPULMONARY: No increased WOB. Speaking in clear sentences. I:E ratio WNL.  MS: Moves all visible extremities without noticeable abnormality. *R ankle with significant swelling and mild bruising to medial and lateral malleolus. Limited ROM. PSYCH: Pleasant and cooperative, well-groomed. Speech normal rate and rhythm. Affect is appropriate. Insight and judgement are appropriate.  Attention is focused, linear, and appropriate.  NEURO: CN grossly intact. Oriented as arrived to appointment on time with no prompting. Moves both UE equally.  SKIN: No obvious lesions, wounds, erythema, or cyanosis noted on face or hands.  Assessment and Plan:   Janaiyah was seen today for acute visit and r le pain/swelling.  Diagnoses and all orders for this visit:  Acute right ankle pain   Needs in-office evaluation. We were able to secure appointment at her orthopedist office in about 1 hr, 2:45pm. She states that her daughter can take her. Appreciate coordination of care.  . Reviewed expectations re: course of current medical issues. . Discussed self-management of symptoms. . Outlined signs and symptoms indicating need for more acute intervention. . Patient verbalized understanding and all questions were answered. Marland Kitchen Health Maintenance issues including appropriate healthy diet, exercise, and smoking avoidance were discussed with patient. . See orders  for this visit as documented in the electronic medical record.  I discussed the assessment and treatment plan with the patient. The patient was provided an opportunity to ask questions and all were answered. The patient agreed with the plan and demonstrated an understanding of the instructions.   The patient was advised to call back or seek an in-person evaluation if the symptoms worsen or if the condition fails to improve as anticipated.   CMA or LPN served as scribe during this visit. History, Physical, and Plan performed by medical provider. The above documentation has been reviewed and is accurate and complete.  [place], [name] [name] 05/04/2019

## 2019-05-05 ENCOUNTER — Telehealth: Payer: Self-pay | Admitting: Family Medicine

## 2019-05-05 NOTE — Telephone Encounter (Signed)
Copied from [place] [phone]. Topic: General - Other >> May 05, 2019  1:32 PM Alanda Slim E wrote: Reason for CRM: Pt called and stated that the orthopedic Dr advised her to call her PCP and let them know that something isnt right and report what he thinks is wrong. / please advise

## 2019-05-06 NOTE — Telephone Encounter (Signed)
Noted. Red words to ED.

## 2019-05-06 NOTE — Telephone Encounter (Signed)
Called patient wanted to let us know:  1. She did not keep her echo app due to not feeling well. She does need to reschedule and will take care of that.   2. Ortho told her that she had bad sprain and she is in boot. When she was seen by ortho he prompted her to call our office to have evaluation. She states doctor was concerned because it took three people to help her up. She was unable to get up alone. She did not have weakness in limbs was just in odd angle and could not get up. She did not have any neuro issues with fall.   I am making patient app to see you.

## 2019-05-08 NOTE — Progress Notes (Signed)
[name] [name] is a 78 y.o. female is here for follow up.  History of Present Illness:   Lonell Grandchild, CMA acting as scribe for Dr. Briscoe Deutscher.   HPI:   From phone note:  1. She did not keep her echo app due to not feeling well. She does need to reschedule and will take care of that.   2. Ortho told her that she had bad sprain and she is in boot. When she was seen by ortho he prompted her to call our office to have evaluation. She states doctor was concerned because it took three people to help her up. She was unable to get up alone. She did not have weakness in limbs was just in odd angle and could not get up. She did not have any neuro issues with fall.   Health Maintenance Due  Topic Date Due  . TETANUS/TDAP  10/21/1960   Depression screen [facility] 2/9 04/01/2019 01/04/2019 12/17/2018  Decreased Interest 0 2 1  Down, Depressed, Hopeless _0 PHQ - 2 Score _1 Altered sleeping 0 0 0  Tired, decreased energy 0 3 3  Change in appetite 0 3 2  Feeling bad or failure about yourself  _2 Trouble concentrating 0 0 0  Moving slowly or fidgety/restless 0 0 0  Suicidal thoughts 0 0 1  PHQ-9 Score _3 Difficult doing work/chores Not difficult at all - -  Some recent data might be hidden   PMHx, SurgHx, SocialHx, FamHx, Medications, and Allergies were reviewed in the Visit Navigator and updated as appropriate.   Patient Active Problem List   Diagnosis Date Noted  . Muscular deconditioning 05/30/2019  . 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 ASCVD (arteriosclerotic cardiovascular disease) risk score 18.9% 11/22/2018  . Depression 10/15/2018  . Seasonal allergies, using Flonase and Loratadine prn 10/15/2018  . Atherosclerosis of aorta ([place]) 10/15/2018  . Diastolic dysfunction 73/73/6681  . 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 Vitamin D deficiency 09/17/2017  . Class 3 severe obesity with serious comorbidity and body mass index (BMI) of 40.0 to 44.9 in adult ([place]) 09/17/2017  .  Hypertriglyceridemia 09/17/2017  . Multiple lung nodules 05/23/2017  . Pulmonary fibrosis ([place]), followed by Pulmonolgy 05/23/2017  . Abnormal CT scan of lung 01/07/2017  . GERD (gastroesophageal reflux disease) 12/12/2016  . Dyspnea on exertion 12/10/2016  . Anxiety state 07/15/2016  . OSA on CPAP, compliant 05/04/2016  . Pacemaker 10/04/2014  . Long term current use of anticoagulant 06/27/2014  . PAF (paroxysmal atrial fibrillation) ([place]) 06/06/2014  . Tachy-brady syndrome ([place]) 06/06/2014  . TIA (transient ischemic attack) 03/08/2014  . Osteopenia 08/15/2008   Social History   Tobacco Use  . Smoking status: Former Smoker    Packs/day: 2.00    Years: 18.00    Pack years: 36.00    Types: Cigarettes  . Smokeless tobacco: Never Used  . Tobacco comment: "quit smoking in 1971"  Substance Use Topics  . Alcohol use: Yes    Alcohol/week: 0.0 standard drinks    Comment: 10/04/2014 "might have a drink a couple times/yr"  . Drug use: No   Current Medications and Allergies   Current Outpatient Medications:  .  apixaban (ELIQUIS) 5 MG TABS tablet, Take 1 tablet (5 mg total) by mouth 2 (two) times daily., Disp: 180 tablet, Rfl: 1 .  Biotin 5000 MCG CAPS, Take 5,000 mcg by mouth daily. , Disp: , Rfl:  .  Calcium Carbonate-Vit  D-Min (CALCIUM 1200 PO), Take 2,400 mg by mouth daily. , Disp: , Rfl:  .  cholecalciferol (VITAMIN D) 1000 UNITS tablet, Take 1,000 Units by mouth daily. , Disp: , Rfl:  .  Co-Enzyme Q10 100 MG CAPS, Take 100 mg by mouth daily. , Disp: , Rfl:  .  diazepam (VALIUM) 5 MG tablet, Take 1 tablet po daily, Disp: 30 tablet, Rfl: 1 .  fluticasone (FLONASE) 50 MCG/ACT nasal spray, Place 2 sprays into both nostrils daily., Disp: 16 g, Rfl: 1 .  folic acid (FOLVITE) 174 MCG tablet, Take 400 mcg by mouth daily., Disp: , Rfl:  .  glucosamine-chondroitin 500-400 MG tablet, Take 1 tablet by mouth daily. , Disp: , Rfl:  .  loratadine (KLS ALLERCLEAR) 10 MG tablet, Take 10 mg by  mouth daily., Disp: , Rfl:  .  MAGNESIUM CITRATE PO, Take 250 mg by mouth daily. 1 tab daily, Disp: , Rfl:  .  nitroGLYCERIN (NITROSTAT) 0.4 MG SL tablet, Place 0.4 mg under the tongue every 5 (five) minutes as needed for chest pain., Disp: , Rfl:  .  omeprazole (PRILOSEC) 20 MG capsule, Take 20 mg by mouth daily., Disp: , Rfl:  .  OVER THE COUNTER MEDICATION, CBD oil, Disp: , Rfl:  .  Resveratrol 250 MG CAPS, Take 250 mg by mouth daily., Disp: , Rfl:  .  sertraline (ZOLOFT) 100 MG tablet, Take 1 tablet by mouth once daily, Disp: 90 tablet, Rfl: 0 .  Simethicone (GAS-X PO), Take 2 tablets by mouth daily as needed (bloating)., Disp: , Rfl:    Allergies  Allergen Reactions  . Ancef [Cefazolin] Hives and Itching  . Pseudoephedrine Hcl     Other reaction(s): palpitations (moderate)  . Ergotamine   . Pentobarbital   . Caffeine Palpitations   Review of Systems   Pertinent items are noted in the HPI. Otherwise, a complete ROS is negative.  Vitals   Vitals:   05/10/19 1502  BP: 140/82  Pulse: 81  Temp: 98.4 F (36.9 C)  TempSrc: Oral  SpO2: 95%  Weight: 260 lb (117.9 kg)  Height: _0  (1.676 m)     Body mass index is 41.97 kg/m.  Physical Exam   Physical Exam Vitals signs reviewed.  Constitutional:      General: She is not in acute distress.    Appearance: Normal appearance. She is well-developed. She is obese.  HENT:     Head: Normocephalic and atraumatic.     Nose: Nose normal.     Mouth/Throat:     Mouth: Mucous membranes are moist.  Cardiovascular:     Rate and Rhythm: Normal rate.  Pulmonary:     Effort: Pulmonary effort is normal.  Musculoskeletal: Normal range of motion.  Skin:    General: Skin is warm and dry.  Neurological:     Mental Status: She is alert and oriented to person, place, and time.     Motor: Weakness present.  Psychiatric:        Mood and Affect: Mood normal.        Behavior: Behavior normal.        Thought Content: Thought content does  not include homicidal or suicidal ideation.    Assessment and Plan   Paislei was seen today for follow-up.  Diagnoses and all orders for this visit:  ASCVD (arteriosclerotic cardiovascular disease) risk score 18.9%  Class 3 severe obesity with serious comorbidity and body mass index (BMI) of 40.0 to 44.9 in adult, unspecified  obesity type ([place]) Wt Readings from Last 3 Encounters:  05/10/19 260 lb (117.9 kg)  03/31/19 257 lb (116.6 kg)  02/01/19 257 lb (882.8 kg)   Diastolic dysfunction Pending ECHO.  Long term current use of anticoagulant  Pacemaker Followed by Cardiology, no concerns.  OSA on CPAP, compliant  Pulmonary fibrosis ([place]), followed by Pulmonolgy Patient loves her current Pulmonologist but would like to see another, possibly more aggressive Pulmonologist that is plugged into current research.  Muscular deconditioning Would do well with PT. Proximal muscle/core weakness. Will need to send referral for PT to [facility].  . Orders and follow up as documented in [place], reviewed diet, exercise and weight control, cardiovascular risk and specific lipid/LDL goals reviewed, reviewed medications and side effects in detail.  . Reviewed expectations re: course of current medical issues. . Outlined signs and symptoms indicating need for more acute intervention. . Patient verbalized understanding and all questions were answered. . Patient received an After Visit Summary.  CMA served as Education administrator during this visit. History, Physical, and Plan performed by medical provider. The above documentation has been reviewed and is accurate and complete. Briscoe Deutscher, D.O.  Briscoe Deutscher, DO [place], [facility] 05/30/2019

## 2019-05-09 ENCOUNTER — Other Ambulatory Visit: Payer: Self-pay | Admitting: Family Medicine

## 2019-05-10 ENCOUNTER — Other Ambulatory Visit: Payer: Self-pay

## 2019-05-10 ENCOUNTER — Ambulatory Visit (INDEPENDENT_AMBULATORY_CARE_PROVIDER_SITE_OTHER): Payer: Medicare Other | Admitting: Family Medicine

## 2019-05-10 ENCOUNTER — Encounter: Payer: Self-pay | Admitting: Family Medicine

## 2019-05-10 VITALS — BP 140/82 | HR 81 | Temp 98.4°F | Ht 66.0 in | Wt 260.0 lb

## 2019-05-10 DIAGNOSIS — Z7901 Long term (current) use of anticoagulants: Secondary | ICD-10-CM | POA: Diagnosis not present

## 2019-05-10 DIAGNOSIS — G4733 Obstructive sleep apnea (adult) (pediatric): Secondary | ICD-10-CM

## 2019-05-10 DIAGNOSIS — R29898 Other symptoms and signs involving the musculoskeletal system: Secondary | ICD-10-CM | POA: Diagnosis not present

## 2019-05-10 DIAGNOSIS — Z9989 Dependence on other enabling machines and devices: Secondary | ICD-10-CM | POA: Diagnosis not present

## 2019-05-10 DIAGNOSIS — Z950 Presence of cardiac pacemaker: Secondary | ICD-10-CM | POA: Diagnosis not present

## 2019-05-10 DIAGNOSIS — Z6841 Body Mass Index (BMI) 40.0 and over, adult: Secondary | ICD-10-CM

## 2019-05-10 DIAGNOSIS — I5189 Other ill-defined heart diseases: Secondary | ICD-10-CM | POA: Diagnosis not present

## 2019-05-10 DIAGNOSIS — I2510 Atherosclerotic heart disease of native coronary artery without angina pectoris: Secondary | ICD-10-CM

## 2019-05-10 DIAGNOSIS — J8410 Pulmonary fibrosis, unspecified: Secondary | ICD-10-CM | POA: Diagnosis not present

## 2019-05-12 ENCOUNTER — Encounter: Payer: Self-pay | Admitting: Family Medicine

## 2019-05-17 ENCOUNTER — Telehealth: Payer: Self-pay | Admitting: Family Medicine

## 2019-05-17 ENCOUNTER — Other Ambulatory Visit: Payer: Self-pay

## 2019-05-17 DIAGNOSIS — J8410 Pulmonary fibrosis, unspecified: Secondary | ICD-10-CM

## 2019-05-17 DIAGNOSIS — M8580 Other specified disorders of bone density and structure, unspecified site: Secondary | ICD-10-CM

## 2019-05-17 NOTE — Telephone Encounter (Signed)
Copied from [place] [id number]. Topic: General - Other >> May 17, 2019 12:41 PM Pauline Good wrote: Reason for CRM: pt stated she spoke with Dr Juleen China and she stated the pt need PT and she need an order faxed to [facility] to get it started. Lyla Son fax # [phone].

## 2019-05-17 NOTE — Telephone Encounter (Signed)
Reason for CRM: pt stated she spoke with Dr Juleen China and she stated the pt need PT and she need an order faxed to [facility] to get it started. Lyla Son fax # [phone].

## 2019-05-17 NOTE — Telephone Encounter (Signed)
See note °

## 2019-05-17 NOTE — Telephone Encounter (Signed)
See note

## 2019-05-17 NOTE — Telephone Encounter (Signed)
Please contact patient to advise

## 2019-05-17 NOTE — Telephone Encounter (Signed)
Pt called about Echogram  schedule and inquired if Dr. Blanchie Serve decided if he would accept her as a Pt. [name] [name] advise/  Also an order is needed for Pt to get PT at [facility].

## 2019-05-17 NOTE — Telephone Encounter (Signed)
Called patient placed order for pt with contact information in notes. I have placed referral for pulmonologist as requested by patient and she also has been given app time/day for Echo tomorrow at 130. She will call if any issues.

## 2019-05-18 ENCOUNTER — Other Ambulatory Visit: Payer: Self-pay

## 2019-05-18 ENCOUNTER — Ambulatory Visit (HOSPITAL_COMMUNITY)
Admission: RE | Admit: 2019-05-18 | Discharge: 2019-05-18 | Disposition: A | Discharge status: Home / Self Care | Payer: Medicare Other | Source: Ambulatory Visit | Attending: Family Medicine | Admitting: Family Medicine

## 2019-05-18 DIAGNOSIS — I358 Other nonrheumatic aortic valve disorders: Secondary | ICD-10-CM | POA: Insufficient documentation

## 2019-05-18 DIAGNOSIS — Z87891 Personal history of nicotine dependence: Secondary | ICD-10-CM | POA: Diagnosis not present

## 2019-05-18 DIAGNOSIS — Z950 Presence of cardiac pacemaker: Secondary | ICD-10-CM | POA: Insufficient documentation

## 2019-05-18 DIAGNOSIS — R0602 Shortness of breath: Secondary | ICD-10-CM | POA: Insufficient documentation

## 2019-05-18 NOTE — Progress Notes (Signed)
  Echocardiogram 2D Echocardiogram has been performed.  Burnett Kanaris 05/18/2019, 2:58 PM

## 2019-05-20 ENCOUNTER — Ambulatory Visit (INDEPENDENT_AMBULATORY_CARE_PROVIDER_SITE_OTHER): Payer: Medicare Other | Admitting: Licensed Clinical Social Worker

## 2019-05-20 DIAGNOSIS — F3341 Major depressive disorder, recurrent, in partial remission: Secondary | ICD-10-CM | POA: Diagnosis not present

## 2019-05-21 DIAGNOSIS — M1990 Unspecified osteoarthritis, unspecified site: Secondary | ICD-10-CM | POA: Diagnosis not present

## 2019-05-21 DIAGNOSIS — R2681 Unsteadiness on feet: Secondary | ICD-10-CM | POA: Diagnosis not present

## 2019-05-21 DIAGNOSIS — M25571 Pain in right ankle and joints of right foot: Secondary | ICD-10-CM | POA: Diagnosis not present

## 2019-05-21 DIAGNOSIS — M25562 Pain in left knee: Secondary | ICD-10-CM | POA: Diagnosis not present

## 2019-05-21 DIAGNOSIS — M6281 Muscle weakness (generalized): Secondary | ICD-10-CM | POA: Diagnosis not present

## 2019-05-21 DIAGNOSIS — I5020 Unspecified systolic (congestive) heart failure: Secondary | ICD-10-CM | POA: Diagnosis not present

## 2019-05-24 ENCOUNTER — Ambulatory Visit: Payer: Self-pay | Admitting: Internal Medicine

## 2019-05-24 DIAGNOSIS — I5020 Unspecified systolic (congestive) heart failure: Secondary | ICD-10-CM | POA: Diagnosis not present

## 2019-05-24 DIAGNOSIS — M25571 Pain in right ankle and joints of right foot: Secondary | ICD-10-CM | POA: Diagnosis not present

## 2019-05-24 DIAGNOSIS — M1990 Unspecified osteoarthritis, unspecified site: Secondary | ICD-10-CM | POA: Diagnosis not present

## 2019-05-24 DIAGNOSIS — M6281 Muscle weakness (generalized): Secondary | ICD-10-CM | POA: Diagnosis not present

## 2019-05-24 DIAGNOSIS — R2681 Unsteadiness on feet: Secondary | ICD-10-CM | POA: Diagnosis not present

## 2019-05-24 DIAGNOSIS — M25562 Pain in left knee: Secondary | ICD-10-CM | POA: Diagnosis not present

## 2019-05-26 DIAGNOSIS — M25562 Pain in left knee: Secondary | ICD-10-CM | POA: Diagnosis not present

## 2019-05-26 DIAGNOSIS — M1990 Unspecified osteoarthritis, unspecified site: Secondary | ICD-10-CM | POA: Diagnosis not present

## 2019-05-26 DIAGNOSIS — M25571 Pain in right ankle and joints of right foot: Secondary | ICD-10-CM | POA: Diagnosis not present

## 2019-05-26 DIAGNOSIS — M6281 Muscle weakness (generalized): Secondary | ICD-10-CM | POA: Diagnosis not present

## 2019-05-26 DIAGNOSIS — R2681 Unsteadiness on feet: Secondary | ICD-10-CM | POA: Diagnosis not present

## 2019-05-26 DIAGNOSIS — I5020 Unspecified systolic (congestive) heart failure: Secondary | ICD-10-CM | POA: Diagnosis not present

## 2019-05-27 ENCOUNTER — Ambulatory Visit: Payer: Self-pay | Admitting: Licensed Clinical Social Worker

## 2019-05-28 ENCOUNTER — Telehealth: Payer: Self-pay | Admitting: Family Medicine

## 2019-05-28 DIAGNOSIS — I5020 Unspecified systolic (congestive) heart failure: Secondary | ICD-10-CM | POA: Diagnosis not present

## 2019-05-28 DIAGNOSIS — M1990 Unspecified osteoarthritis, unspecified site: Secondary | ICD-10-CM | POA: Diagnosis not present

## 2019-05-28 DIAGNOSIS — M25571 Pain in right ankle and joints of right foot: Secondary | ICD-10-CM | POA: Diagnosis not present

## 2019-05-28 DIAGNOSIS — R2681 Unsteadiness on feet: Secondary | ICD-10-CM | POA: Diagnosis not present

## 2019-05-28 DIAGNOSIS — M6281 Muscle weakness (generalized): Secondary | ICD-10-CM | POA: Diagnosis not present

## 2019-05-28 DIAGNOSIS — M25562 Pain in left knee: Secondary | ICD-10-CM | POA: Diagnosis not present

## 2019-05-28 MED ORDER — CIPROFLOXACIN HCL 250 MG PO TABS: 250.0000 mg | ORAL_TABLET | Freq: Two times a day (BID) | ORAL | 0 refills | Status: DC

## 2019-05-28 NOTE — Telephone Encounter (Signed)
Pt calling in because she said she has a bladder infection and wants to get something for it, Dr Juleen China doesn't have anything today but Dr Jonni Sanger does but it would have to be in office per Dr Jonni Sanger. The pt said she cant right now because she has some things going on and asked if she could speak to [facility], She was with a pt so I informed pt I would put in a message for [name] to call back when she had a moment at [phone], Mrs Santillo was fine with that.

## 2019-05-28 NOTE — Telephone Encounter (Signed)
I sent Cipro to the [facility]. Will need to get in-office care if not improving.

## 2019-05-28 NOTE — Telephone Encounter (Signed)
Patient informed. 

## 2019-05-28 NOTE — Addendum Note (Signed)
Addended by: Briscoe Deutscher R on: 05/28/2019 12:21 PM   Modules accepted: Orders

## 2019-05-28 NOTE — Telephone Encounter (Signed)
Please call her and get info, see if she can give UA?

## 2019-05-28 NOTE — Telephone Encounter (Signed)
Rx cancelled from [place] [facility] and called into [facility].  Per pt.

## 2019-05-28 NOTE — Telephone Encounter (Signed)
LDM to call office back.

## 2019-05-28 NOTE — Telephone Encounter (Signed)
I spoke with patient and she informed me that her symptoms are urine frequency and pressure.  Patient said the she has had many bladder infections and knows what it feel like when coming on.  Patient explains that she did not feel up to coming in today to leave a sample.  I informed patient that I would let Dr. Juleen China know and get back to her.

## 2019-05-30 DIAGNOSIS — R29898 Other symptoms and signs involving the musculoskeletal system: Secondary | ICD-10-CM | POA: Insufficient documentation

## 2019-05-31 DIAGNOSIS — I5020 Unspecified systolic (congestive) heart failure: Secondary | ICD-10-CM | POA: Diagnosis not present

## 2019-05-31 DIAGNOSIS — R2681 Unsteadiness on feet: Secondary | ICD-10-CM | POA: Diagnosis not present

## 2019-05-31 DIAGNOSIS — M1990 Unspecified osteoarthritis, unspecified site: Secondary | ICD-10-CM | POA: Diagnosis not present

## 2019-05-31 DIAGNOSIS — M25571 Pain in right ankle and joints of right foot: Secondary | ICD-10-CM | POA: Diagnosis not present

## 2019-05-31 DIAGNOSIS — M25562 Pain in left knee: Secondary | ICD-10-CM | POA: Diagnosis not present

## 2019-05-31 DIAGNOSIS — M6281 Muscle weakness (generalized): Secondary | ICD-10-CM | POA: Diagnosis not present

## 2019-06-01 DIAGNOSIS — M25571 Pain in right ankle and joints of right foot: Secondary | ICD-10-CM | POA: Diagnosis not present

## 2019-06-01 DIAGNOSIS — M79671 Pain in right foot: Secondary | ICD-10-CM | POA: Diagnosis not present

## 2019-06-01 DIAGNOSIS — M1712 Unilateral primary osteoarthritis, left knee: Secondary | ICD-10-CM | POA: Diagnosis not present

## 2019-06-01 DIAGNOSIS — M25562 Pain in left knee: Secondary | ICD-10-CM | POA: Diagnosis not present

## 2019-06-02 DIAGNOSIS — M6281 Muscle weakness (generalized): Secondary | ICD-10-CM | POA: Diagnosis not present

## 2019-06-02 DIAGNOSIS — M25562 Pain in left knee: Secondary | ICD-10-CM | POA: Diagnosis not present

## 2019-06-02 DIAGNOSIS — I5020 Unspecified systolic (congestive) heart failure: Secondary | ICD-10-CM | POA: Diagnosis not present

## 2019-06-02 DIAGNOSIS — R2681 Unsteadiness on feet: Secondary | ICD-10-CM | POA: Diagnosis not present

## 2019-06-02 DIAGNOSIS — M25571 Pain in right ankle and joints of right foot: Secondary | ICD-10-CM | POA: Diagnosis not present

## 2019-06-02 DIAGNOSIS — M1990 Unspecified osteoarthritis, unspecified site: Secondary | ICD-10-CM | POA: Diagnosis not present

## 2019-06-04 DIAGNOSIS — I5020 Unspecified systolic (congestive) heart failure: Secondary | ICD-10-CM | POA: Diagnosis not present

## 2019-06-04 DIAGNOSIS — M25571 Pain in right ankle and joints of right foot: Secondary | ICD-10-CM | POA: Diagnosis not present

## 2019-06-04 DIAGNOSIS — M6281 Muscle weakness (generalized): Secondary | ICD-10-CM | POA: Diagnosis not present

## 2019-06-04 DIAGNOSIS — M25562 Pain in left knee: Secondary | ICD-10-CM | POA: Diagnosis not present

## 2019-06-04 DIAGNOSIS — R2681 Unsteadiness on feet: Secondary | ICD-10-CM | POA: Diagnosis not present

## 2019-06-04 DIAGNOSIS — M1990 Unspecified osteoarthritis, unspecified site: Secondary | ICD-10-CM | POA: Diagnosis not present

## 2019-06-07 ENCOUNTER — Other Ambulatory Visit: Payer: Self-pay | Admitting: Family Medicine

## 2019-06-07 ENCOUNTER — Telehealth: Payer: Self-pay | Admitting: Family Medicine

## 2019-06-07 DIAGNOSIS — M25571 Pain in right ankle and joints of right foot: Secondary | ICD-10-CM | POA: Diagnosis not present

## 2019-06-07 DIAGNOSIS — M25562 Pain in left knee: Secondary | ICD-10-CM | POA: Diagnosis not present

## 2019-06-07 DIAGNOSIS — R2681 Unsteadiness on feet: Secondary | ICD-10-CM | POA: Diagnosis not present

## 2019-06-07 DIAGNOSIS — M1990 Unspecified osteoarthritis, unspecified site: Secondary | ICD-10-CM | POA: Diagnosis not present

## 2019-06-07 DIAGNOSIS — I5020 Unspecified systolic (congestive) heart failure: Secondary | ICD-10-CM | POA: Diagnosis not present

## 2019-06-07 DIAGNOSIS — M6281 Muscle weakness (generalized): Secondary | ICD-10-CM | POA: Diagnosis not present

## 2019-06-07 MED ORDER — DIAZEPAM 5 MG PO TABS: ORAL_TABLET | ORAL | 1 refills | Status: DC

## 2019-06-07 NOTE — Telephone Encounter (Signed)
See note °

## 2019-06-07 NOTE — Telephone Encounter (Signed)
Copied from [place] [phone]. Topic: Quick Communication - Rx Refill/Question >> Jun 07, 2019  2:46 PM Leward Quan A wrote: Medication: diazepam (VALIUM) 5 MG tablet   Has the patient contacted their pharmacy? Yes.   (Agent: If no, request that the patient contact the pharmacy for the refill.) (Agent: If yes, when and what did the pharmacy advise?)  Preferred Pharmacy (with phone number or street name): [place], [place]. [phone] (Phone) [phone] (Fax)    Agent: Please be advised that RX refills may take up to 3 business days. We ask that you follow-up with your pharmacy.

## 2019-06-07 NOTE — Telephone Encounter (Signed)
Please advise

## 2019-06-07 NOTE — Telephone Encounter (Signed)
See note  Copied from [place] [id number]. Topic: General - Inquiry >> Jun 07, 2019  2:15 PM Reyne Dumas L wrote: Reason for CRM:   Pt needs someone to help her understand the results of her recent echocardiogram.

## 2019-06-07 NOTE — Telephone Encounter (Signed)
   Has f/u on 07/02/19 ok to fill?

## 2019-06-08 NOTE — Telephone Encounter (Signed)
Left message to return call to our office.  

## 2019-06-08 NOTE — Telephone Encounter (Signed)
Called patient reviewed results. And answered all questions. She also wanted to know : she is now at [facility]. They have given her a small battery operated alert device that she is to use when here. She wanted to make sure that is will not effect her pace maker.

## 2019-06-08 NOTE — Telephone Encounter (Signed)
The ECHO is normal. Pumping normally (the ejection fraction is normal). Valves look good. Age-related changes but no issues. All good.

## 2019-06-09 DIAGNOSIS — M6281 Muscle weakness (generalized): Secondary | ICD-10-CM | POA: Diagnosis not present

## 2019-06-09 DIAGNOSIS — M25562 Pain in left knee: Secondary | ICD-10-CM | POA: Diagnosis not present

## 2019-06-09 DIAGNOSIS — I5020 Unspecified systolic (congestive) heart failure: Secondary | ICD-10-CM | POA: Diagnosis not present

## 2019-06-09 DIAGNOSIS — M1990 Unspecified osteoarthritis, unspecified site: Secondary | ICD-10-CM | POA: Diagnosis not present

## 2019-06-09 DIAGNOSIS — M25571 Pain in right ankle and joints of right foot: Secondary | ICD-10-CM | POA: Diagnosis not present

## 2019-06-09 DIAGNOSIS — R2681 Unsteadiness on feet: Secondary | ICD-10-CM | POA: Diagnosis not present

## 2019-06-09 NOTE — Telephone Encounter (Signed)
No, that should be fine.

## 2019-06-10 ENCOUNTER — Ambulatory Visit (INDEPENDENT_AMBULATORY_CARE_PROVIDER_SITE_OTHER): Payer: Medicare Other | Admitting: Licensed Clinical Social Worker

## 2019-06-10 ENCOUNTER — Telehealth: Payer: Self-pay | Admitting: *Deleted

## 2019-06-10 DIAGNOSIS — F3341 Major depressive disorder, recurrent, in partial remission: Secondary | ICD-10-CM

## 2019-06-10 NOTE — Telephone Encounter (Signed)
Spoke to pt told her small battery alert device will be fine with your pacemaker per Dr. Juleen China. Pt verbalized understanding.

## 2019-06-10 NOTE — Telephone Encounter (Signed)
[name] [name], can you look into Pulmonology referral that was placed on 6/15. Pt has not heard from anyone yet.

## 2019-06-11 DIAGNOSIS — R2681 Unsteadiness on feet: Secondary | ICD-10-CM | POA: Diagnosis not present

## 2019-06-11 DIAGNOSIS — I5020 Unspecified systolic (congestive) heart failure: Secondary | ICD-10-CM | POA: Diagnosis not present

## 2019-06-11 DIAGNOSIS — M25571 Pain in right ankle and joints of right foot: Secondary | ICD-10-CM | POA: Diagnosis not present

## 2019-06-11 DIAGNOSIS — M25562 Pain in left knee: Secondary | ICD-10-CM | POA: Diagnosis not present

## 2019-06-11 DIAGNOSIS — M1990 Unspecified osteoarthritis, unspecified site: Secondary | ICD-10-CM | POA: Diagnosis not present

## 2019-06-11 DIAGNOSIS — M6281 Muscle weakness (generalized): Secondary | ICD-10-CM | POA: Diagnosis not present

## 2019-06-14 DIAGNOSIS — R2681 Unsteadiness on feet: Secondary | ICD-10-CM | POA: Diagnosis not present

## 2019-06-14 DIAGNOSIS — I5020 Unspecified systolic (congestive) heart failure: Secondary | ICD-10-CM | POA: Diagnosis not present

## 2019-06-14 DIAGNOSIS — M25571 Pain in right ankle and joints of right foot: Secondary | ICD-10-CM | POA: Diagnosis not present

## 2019-06-14 DIAGNOSIS — M1990 Unspecified osteoarthritis, unspecified site: Secondary | ICD-10-CM | POA: Diagnosis not present

## 2019-06-14 DIAGNOSIS — M25562 Pain in left knee: Secondary | ICD-10-CM | POA: Diagnosis not present

## 2019-06-14 DIAGNOSIS — M6281 Muscle weakness (generalized): Secondary | ICD-10-CM | POA: Diagnosis not present

## 2019-06-16 ENCOUNTER — Ambulatory Visit (INDEPENDENT_AMBULATORY_CARE_PROVIDER_SITE_OTHER): Payer: Medicare Other | Admitting: Licensed Clinical Social Worker

## 2019-06-16 DIAGNOSIS — I5020 Unspecified systolic (congestive) heart failure: Secondary | ICD-10-CM | POA: Diagnosis not present

## 2019-06-16 DIAGNOSIS — R2681 Unsteadiness on feet: Secondary | ICD-10-CM | POA: Diagnosis not present

## 2019-06-16 DIAGNOSIS — F3341 Major depressive disorder, recurrent, in partial remission: Secondary | ICD-10-CM | POA: Diagnosis not present

## 2019-06-16 DIAGNOSIS — M25571 Pain in right ankle and joints of right foot: Secondary | ICD-10-CM | POA: Diagnosis not present

## 2019-06-16 DIAGNOSIS — M25562 Pain in left knee: Secondary | ICD-10-CM | POA: Diagnosis not present

## 2019-06-16 DIAGNOSIS — M1990 Unspecified osteoarthritis, unspecified site: Secondary | ICD-10-CM | POA: Diagnosis not present

## 2019-06-16 DIAGNOSIS — M6281 Muscle weakness (generalized): Secondary | ICD-10-CM | POA: Diagnosis not present

## 2019-06-18 DIAGNOSIS — M25571 Pain in right ankle and joints of right foot: Secondary | ICD-10-CM | POA: Diagnosis not present

## 2019-06-18 DIAGNOSIS — I5020 Unspecified systolic (congestive) heart failure: Secondary | ICD-10-CM | POA: Diagnosis not present

## 2019-06-18 DIAGNOSIS — R2681 Unsteadiness on feet: Secondary | ICD-10-CM | POA: Diagnosis not present

## 2019-06-18 DIAGNOSIS — M25562 Pain in left knee: Secondary | ICD-10-CM | POA: Diagnosis not present

## 2019-06-18 DIAGNOSIS — M6281 Muscle weakness (generalized): Secondary | ICD-10-CM | POA: Diagnosis not present

## 2019-06-18 DIAGNOSIS — M1990 Unspecified osteoarthritis, unspecified site: Secondary | ICD-10-CM | POA: Diagnosis not present

## 2019-06-22 DIAGNOSIS — M1712 Unilateral primary osteoarthritis, left knee: Secondary | ICD-10-CM | POA: Diagnosis not present

## 2019-06-22 DIAGNOSIS — M25562 Pain in left knee: Secondary | ICD-10-CM | POA: Diagnosis not present

## 2019-06-23 DIAGNOSIS — I5020 Unspecified systolic (congestive) heart failure: Secondary | ICD-10-CM | POA: Diagnosis not present

## 2019-06-23 DIAGNOSIS — M6281 Muscle weakness (generalized): Secondary | ICD-10-CM | POA: Diagnosis not present

## 2019-06-23 DIAGNOSIS — M25571 Pain in right ankle and joints of right foot: Secondary | ICD-10-CM | POA: Diagnosis not present

## 2019-06-23 DIAGNOSIS — R2681 Unsteadiness on feet: Secondary | ICD-10-CM | POA: Diagnosis not present

## 2019-06-23 DIAGNOSIS — M1990 Unspecified osteoarthritis, unspecified site: Secondary | ICD-10-CM | POA: Diagnosis not present

## 2019-06-23 DIAGNOSIS — M25562 Pain in left knee: Secondary | ICD-10-CM | POA: Diagnosis not present

## 2019-06-25 DIAGNOSIS — M25571 Pain in right ankle and joints of right foot: Secondary | ICD-10-CM | POA: Diagnosis not present

## 2019-06-25 DIAGNOSIS — I5020 Unspecified systolic (congestive) heart failure: Secondary | ICD-10-CM | POA: Diagnosis not present

## 2019-06-25 DIAGNOSIS — M25562 Pain in left knee: Secondary | ICD-10-CM | POA: Diagnosis not present

## 2019-06-25 DIAGNOSIS — R2681 Unsteadiness on feet: Secondary | ICD-10-CM | POA: Diagnosis not present

## 2019-06-25 DIAGNOSIS — M6281 Muscle weakness (generalized): Secondary | ICD-10-CM | POA: Diagnosis not present

## 2019-06-25 DIAGNOSIS — M1990 Unspecified osteoarthritis, unspecified site: Secondary | ICD-10-CM | POA: Diagnosis not present

## 2019-06-28 DIAGNOSIS — M25571 Pain in right ankle and joints of right foot: Secondary | ICD-10-CM | POA: Diagnosis not present

## 2019-06-28 DIAGNOSIS — M1990 Unspecified osteoarthritis, unspecified site: Secondary | ICD-10-CM | POA: Diagnosis not present

## 2019-06-28 DIAGNOSIS — I5020 Unspecified systolic (congestive) heart failure: Secondary | ICD-10-CM | POA: Diagnosis not present

## 2019-06-28 DIAGNOSIS — R2681 Unsteadiness on feet: Secondary | ICD-10-CM | POA: Diagnosis not present

## 2019-06-28 DIAGNOSIS — M25562 Pain in left knee: Secondary | ICD-10-CM | POA: Diagnosis not present

## 2019-06-28 DIAGNOSIS — M6281 Muscle weakness (generalized): Secondary | ICD-10-CM | POA: Diagnosis not present

## 2019-06-30 ENCOUNTER — Ambulatory Visit (INDEPENDENT_AMBULATORY_CARE_PROVIDER_SITE_OTHER): Payer: Medicare Other | Admitting: Licensed Clinical Social Worker

## 2019-06-30 DIAGNOSIS — M25562 Pain in left knee: Secondary | ICD-10-CM | POA: Diagnosis not present

## 2019-06-30 DIAGNOSIS — R2681 Unsteadiness on feet: Secondary | ICD-10-CM | POA: Diagnosis not present

## 2019-06-30 DIAGNOSIS — F3341 Major depressive disorder, recurrent, in partial remission: Secondary | ICD-10-CM

## 2019-06-30 DIAGNOSIS — M25571 Pain in right ankle and joints of right foot: Secondary | ICD-10-CM | POA: Diagnosis not present

## 2019-06-30 DIAGNOSIS — I5020 Unspecified systolic (congestive) heart failure: Secondary | ICD-10-CM | POA: Diagnosis not present

## 2019-06-30 DIAGNOSIS — M1990 Unspecified osteoarthritis, unspecified site: Secondary | ICD-10-CM | POA: Diagnosis not present

## 2019-06-30 DIAGNOSIS — M6281 Muscle weakness (generalized): Secondary | ICD-10-CM | POA: Diagnosis not present

## 2019-07-01 DIAGNOSIS — M25571 Pain in right ankle and joints of right foot: Secondary | ICD-10-CM | POA: Diagnosis not present

## 2019-07-01 DIAGNOSIS — M1990 Unspecified osteoarthritis, unspecified site: Secondary | ICD-10-CM | POA: Diagnosis not present

## 2019-07-01 DIAGNOSIS — R2681 Unsteadiness on feet: Secondary | ICD-10-CM | POA: Diagnosis not present

## 2019-07-01 DIAGNOSIS — M6281 Muscle weakness (generalized): Secondary | ICD-10-CM | POA: Diagnosis not present

## 2019-07-01 DIAGNOSIS — I5020 Unspecified systolic (congestive) heart failure: Secondary | ICD-10-CM | POA: Diagnosis not present

## 2019-07-01 DIAGNOSIS — M25562 Pain in left knee: Secondary | ICD-10-CM | POA: Diagnosis not present

## 2019-07-02 ENCOUNTER — Ambulatory Visit: Payer: Medicare Other | Admitting: Family Medicine

## 2019-07-02 DIAGNOSIS — I5020 Unspecified systolic (congestive) heart failure: Secondary | ICD-10-CM | POA: Diagnosis not present

## 2019-07-02 DIAGNOSIS — M1990 Unspecified osteoarthritis, unspecified site: Secondary | ICD-10-CM | POA: Diagnosis not present

## 2019-07-02 DIAGNOSIS — M25571 Pain in right ankle and joints of right foot: Secondary | ICD-10-CM | POA: Diagnosis not present

## 2019-07-02 DIAGNOSIS — R2681 Unsteadiness on feet: Secondary | ICD-10-CM | POA: Diagnosis not present

## 2019-07-02 DIAGNOSIS — M25562 Pain in left knee: Secondary | ICD-10-CM | POA: Diagnosis not present

## 2019-07-02 DIAGNOSIS — M6281 Muscle weakness (generalized): Secondary | ICD-10-CM | POA: Diagnosis not present

## 2019-07-06 DIAGNOSIS — R2681 Unsteadiness on feet: Secondary | ICD-10-CM | POA: Diagnosis not present

## 2019-07-06 DIAGNOSIS — M25562 Pain in left knee: Secondary | ICD-10-CM | POA: Diagnosis not present

## 2019-07-06 DIAGNOSIS — M1990 Unspecified osteoarthritis, unspecified site: Secondary | ICD-10-CM | POA: Diagnosis not present

## 2019-07-06 DIAGNOSIS — I5020 Unspecified systolic (congestive) heart failure: Secondary | ICD-10-CM | POA: Diagnosis not present

## 2019-07-06 DIAGNOSIS — M8580 Other specified disorders of bone density and structure, unspecified site: Secondary | ICD-10-CM | POA: Diagnosis not present

## 2019-07-06 DIAGNOSIS — M25571 Pain in right ankle and joints of right foot: Secondary | ICD-10-CM | POA: Diagnosis not present

## 2019-07-06 DIAGNOSIS — M6281 Muscle weakness (generalized): Secondary | ICD-10-CM | POA: Diagnosis not present

## 2019-07-08 ENCOUNTER — Other Ambulatory Visit: Payer: Self-pay | Admitting: Internal Medicine

## 2019-07-08 DIAGNOSIS — M25562 Pain in left knee: Secondary | ICD-10-CM | POA: Diagnosis not present

## 2019-07-08 DIAGNOSIS — I5020 Unspecified systolic (congestive) heart failure: Secondary | ICD-10-CM | POA: Diagnosis not present

## 2019-07-08 DIAGNOSIS — M6281 Muscle weakness (generalized): Secondary | ICD-10-CM | POA: Diagnosis not present

## 2019-07-08 DIAGNOSIS — M25571 Pain in right ankle and joints of right foot: Secondary | ICD-10-CM | POA: Diagnosis not present

## 2019-07-08 DIAGNOSIS — R2681 Unsteadiness on feet: Secondary | ICD-10-CM | POA: Diagnosis not present

## 2019-07-08 DIAGNOSIS — M1990 Unspecified osteoarthritis, unspecified site: Secondary | ICD-10-CM | POA: Diagnosis not present

## 2019-07-09 DIAGNOSIS — I5020 Unspecified systolic (congestive) heart failure: Secondary | ICD-10-CM | POA: Diagnosis not present

## 2019-07-09 DIAGNOSIS — M25562 Pain in left knee: Secondary | ICD-10-CM | POA: Diagnosis not present

## 2019-07-09 DIAGNOSIS — R2681 Unsteadiness on feet: Secondary | ICD-10-CM | POA: Diagnosis not present

## 2019-07-09 DIAGNOSIS — M1990 Unspecified osteoarthritis, unspecified site: Secondary | ICD-10-CM | POA: Diagnosis not present

## 2019-07-09 DIAGNOSIS — M25571 Pain in right ankle and joints of right foot: Secondary | ICD-10-CM | POA: Diagnosis not present

## 2019-07-09 DIAGNOSIS — M6281 Muscle weakness (generalized): Secondary | ICD-10-CM | POA: Diagnosis not present

## 2019-07-12 ENCOUNTER — Telehealth: Payer: Self-pay | Admitting: Cardiovascular Disease

## 2019-07-12 DIAGNOSIS — R2681 Unsteadiness on feet: Secondary | ICD-10-CM | POA: Diagnosis not present

## 2019-07-12 DIAGNOSIS — I5020 Unspecified systolic (congestive) heart failure: Secondary | ICD-10-CM | POA: Diagnosis not present

## 2019-07-12 DIAGNOSIS — M25571 Pain in right ankle and joints of right foot: Secondary | ICD-10-CM | POA: Diagnosis not present

## 2019-07-12 DIAGNOSIS — M1990 Unspecified osteoarthritis, unspecified site: Secondary | ICD-10-CM | POA: Diagnosis not present

## 2019-07-12 DIAGNOSIS — M6281 Muscle weakness (generalized): Secondary | ICD-10-CM | POA: Diagnosis not present

## 2019-07-12 DIAGNOSIS — M25562 Pain in left knee: Secondary | ICD-10-CM | POA: Diagnosis not present

## 2019-07-12 NOTE — Telephone Encounter (Signed)
Spoke with pt, she had a fall and for the last few days she has had increased swelling in her feet and legs. She reports her SOB is about the same but is probably a little worse. They wanted her to be seen. Follow up scheduled tomorrow with APP.

## 2019-07-12 NOTE — Telephone Encounter (Signed)
New Message:    Pt say she saw her hysical Therapist this morning. She told her to see her Cardiologist asap. Her legs and feet are swelling bad. When can pt see Dr C or his PA?

## 2019-07-13 ENCOUNTER — Ambulatory Visit (INDEPENDENT_AMBULATORY_CARE_PROVIDER_SITE_OTHER): Payer: Medicare Other | Admitting: Physician Assistant

## 2019-07-13 ENCOUNTER — Ambulatory Visit: Payer: Medicare Other | Admitting: Physician Assistant

## 2019-07-13 ENCOUNTER — Encounter: Payer: Self-pay | Admitting: Physician Assistant

## 2019-07-13 ENCOUNTER — Ambulatory Visit (INDEPENDENT_AMBULATORY_CARE_PROVIDER_SITE_OTHER): Payer: Medicare Other | Admitting: *Deleted

## 2019-07-13 ENCOUNTER — Other Ambulatory Visit: Payer: Self-pay

## 2019-07-13 VITALS — BP 125/69 | HR 85 | Temp 97.2°F | Ht 67.0 in | Wt 273.0 lb

## 2019-07-13 DIAGNOSIS — I495 Sick sinus syndrome: Secondary | ICD-10-CM

## 2019-07-13 DIAGNOSIS — J84112 Idiopathic pulmonary fibrosis: Secondary | ICD-10-CM

## 2019-07-13 DIAGNOSIS — Z950 Presence of cardiac pacemaker: Secondary | ICD-10-CM

## 2019-07-13 DIAGNOSIS — I2510 Atherosclerotic heart disease of native coronary artery without angina pectoris: Secondary | ICD-10-CM

## 2019-07-13 DIAGNOSIS — G459 Transient cerebral ischemic attack, unspecified: Secondary | ICD-10-CM

## 2019-07-13 DIAGNOSIS — R7303 Prediabetes: Secondary | ICD-10-CM

## 2019-07-13 DIAGNOSIS — I480 Paroxysmal atrial fibrillation: Secondary | ICD-10-CM

## 2019-07-13 DIAGNOSIS — I10 Essential (primary) hypertension: Secondary | ICD-10-CM

## 2019-07-13 LAB — CUP PACEART REMOTE DEVICE CHECK
Battery Remaining Longevity: 74 mo
Battery Voltage: 3.01 V
Brady Statistic AP VP Percent: 0.05 %
Brady Statistic AP VS Percent: 83.24 %
Brady Statistic AS VP Percent: 0.06 %
Brady Statistic AS VS Percent: 16.65 %
Brady Statistic RA Percent Paced: 82.94 %
Brady Statistic RV Percent Paced: 0.11 %
Date Time Interrogation Session: 20200811160432
Implantable Lead Implant Date: 20151103
Implantable Lead Implant Date: 20151103
Implantable Lead Location: 753859
Implantable Lead Location: 753860
Implantable Lead Model: 5076
Implantable Lead Model: 5076
Implantable Pulse Generator Implant Date: 20151103
Lead Channel Impedance Value: 399 Ohm
Lead Channel Impedance Value: 456 Ohm
Lead Channel Impedance Value: 456 Ohm
Lead Channel Impedance Value: 494 Ohm
Lead Channel Pacing Threshold Amplitude: 0.5 V
Lead Channel Pacing Threshold Amplitude: 0.5 V
Lead Channel Pacing Threshold Pulse Width: 0.4 ms
Lead Channel Pacing Threshold Pulse Width: 0.4 ms
Lead Channel Sensing Intrinsic Amplitude: 4.875 mV
Lead Channel Sensing Intrinsic Amplitude: 4.875 mV
Lead Channel Sensing Intrinsic Amplitude: 6.5 mV
Lead Channel Sensing Intrinsic Amplitude: 6.5 mV
Lead Channel Setting Pacing Amplitude: 1.5 V
Lead Channel Setting Pacing Amplitude: 2 V
Lead Channel Setting Pacing Pulse Width: 0.4 ms
Lead Channel Setting Sensing Sensitivity: 2.8 mV

## 2019-07-13 MED ORDER — FUROSEMIDE 20 MG PO TABS: 20.0000 mg | ORAL_TABLET | Freq: Every day | ORAL | 1 refills | Status: DC

## 2019-07-13 NOTE — Progress Notes (Signed)
Cardiology Office Note    Date:  07/14/2019   ID:  [name] [name], DOB 1941-01-01, MRN [id number]  PCP:  Briscoe Deutscher, DO  Cardiologist: Dr. Sallyanne Kuster   Chief Complaint  Patient presents with  . Follow-up    seen for Dr. Sallyanne Kuster    History of Present Illness:  [name] [name] is a 78 y.o. 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  She does not have any prior history of CAD although she does have evidence of atherosclerosis of aorta.  Myoview in January 2018 was low risk.  Last device interrogation was on 04/18/2019, device was functioning normally and the patient had a low stable burden of A. fib.  Echocardiogram obtained on 05/18/2019 showed EF 50 to 12%, diastolic function could not be evaluated, otherwise no significant valve issue.  Patient was recently seen her physical therapist who noted she had significant bilateral lower extremity edema after a fall.  She was instructed to follow-up with cardiology service immediately.  She presents today for evaluation of lower extremity edema.  On physical exam, she has trace amount of lower extremity edema.  This is worse at the end of the day.  Otherwise she denies any orthopnea or PND.  She feels her dyspnea has been worsening for the past year. She denies any recent chest pain.  I recommended Lasix 20 mg daily.  She will obtain CMP, CBC and TSH next Monday.   Past Medical History:  Diagnosis Date  . Alpha-1-antitrypsin deficiency carrier   . Arthritis    "all over"  . Atrial fibrillation ([place])   . Back pain   . Complication of anesthesia    "I woke up during 2 different procedures" (10/04/2014)  . Frequent UTI   . GERD (gastroesophageal reflux disease)   . Hypertension   . MVP (mitral valve prolapse)   . Obesity   .  Osteoarthritis   . Presence of permanent cardiac pacemaker   . PVC's (premature ventricular contractions)   . Shortness of breath   . 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 Sleep apnea   . Stroke [facility]) 01/2014   denies deficits on 10/04/2014  . Tachy-brady syndrome ([place]) 10/2014  . TIA (transient ischemic attack)     Past Surgical History:  Procedure Laterality Date  . CARDIOVERSION N/A 09/02/2017   Procedure: CARDIOVERSION;  Surgeon: Sanda Klein, MD;  Location: [facility] ENDOS[facility];  Service: Cardiovascular;  Laterality: N/A;  . EXCISION VAGINAL CYST     benign nodules  . INSERT / REPLACE / REMOVE PACEMAKER  10/04/2014   Medtronic Advisa model [id number] serial number [id number]  . LOOP RECORDER EXPLANT N/A 10/04/2014   Procedure: LOOP RECORDER EXPLANT;  Surgeon: Sanda Klein, MD;  Location: [facility] CATH LAB;  Service: Cardiovascular;  Laterality: N/A;  . LOOP RECORDER IMPLANT N/A 04/19/2014   Procedure: LOOP RECORDER IMPLANT;  Surgeon: Sanda Klein, MD;  Location: [facility] CATH LAB;  Service: Cardiovascular;  Laterality: N/A;  . NM MYOCAR PERF WALL MOTION  12/18/2007   normal  . PERMANENT PACEMAKER INSERTION N/A 10/04/2014   Procedure: PERMANENT PACEMAKER INSERTION;  Surgeon: Sanda Klein, MD;  Location: [facility] CATH LAB;  Service: Cardiovascular;  Laterality: N/A;  . TUBAL LIGATION    . US ECHOCARDIOGRAPHY  05/15/2010   LA mildly dilated,mild mitral annular ca+,  AOV mildly sclerotic, mild asymmetric LVH    Current Medications: Outpatient Medications Prior to Visit  Medication Sig Dispense Refill  . apixaban (ELIQUIS) 5 MG TABS tablet Take 1 tablet (5 mg total) by mouth 2 (two) times daily. 180 tablet 1  . Biotin 5000 MCG CAPS Take 5,000 mcg by mouth daily.     . Calcium Carbonate-Vit D-Min (CALCIUM 1200 PO) Take 2,400 mg by mouth daily.     . cholecalciferol (VITAMIN D) 1000 UNITS tablet Take 1,000 Units by mouth daily.     Marland Kitchen Co-Enzyme Q10 100 MG CAPS  Take 100 mg by mouth daily.     . diazepam (VALIUM) 5 MG tablet Take 1 tablet po daily 30 tablet 1  . fluticasone (FLONASE) 50 MCG/ACT nasal spray Place 2 sprays into both nostrils daily. 16 g 1  . folic acid (FOLVITE) 478 MCG tablet Take 400 mcg by mouth daily.    Marland Kitchen glucosamine-chondroitin 500-400 MG tablet Take 1 tablet by mouth daily.     Marland Kitchen MAGNESIUM CITRATE PO Take 250 mg by mouth daily. 1 tab daily    . nitroGLYCERIN (NITROSTAT) 0.4 MG SL tablet Place 0.4 mg under the tongue every 5 (five) minutes as needed for chest pain.    Marland Kitchen omeprazole (PRILOSEC) 20 MG capsule Take 20 mg by mouth daily.    Marland Kitchen OVER THE COUNTER MEDICATION CBD oil    . Resveratrol 250 MG CAPS Take 250 mg by mouth daily.    . sertraline (ZOLOFT) 100 MG tablet Take 1 tablet by mouth once daily 90 tablet 0  . ciprofloxacin (CIPRO) 250 MG tablet Take 1 tablet (250 mg total) by mouth 2 (two) times daily. (Patient not taking: Reported on 07/13/2019) 6 tablet 0  . loratadine (KLS ALLERCLEAR) 10 MG tablet Take 10 mg by mouth daily.    . Simethicone (GAS-X PO) Take 2 tablets by mouth daily as needed (bloating).     No facility-administered medications prior to visit.      Allergies:   Ancef [cefazolin], Pseudoephedrine hcl, Ergotamine, Pentobarbital, and Caffeine   Social History   Socioeconomic History  . Marital status: Divorced    Spouse name: Not on file  . Number of children: 3  . Years of education: college  . Highest education level: Not on file  Occupational History  . Occupation: Retired Engineer, site  Social Needs  . Financial resource strain: Not on file  . Food insecurity    Worry: Not on file    Inability: Not on file  . Transportation needs    Medical: Not on file    Non-medical: Not on file  Tobacco Use  . Smoking status: Former Smoker    Packs/day: 2.00    Years: 18.00    Pack years: 36.00    Types: Cigarettes  . Smokeless tobacco: Never Used  . Tobacco comment: "quit smoking in 1971"   Substance and Sexual Activity  . Alcohol use: Yes    Alcohol/week: 0.0 standard drinks    Comment: 10/04/2014 "might have a drink a couple times/yr"  . Drug use: No  . Sexual activity: Never  Lifestyle  . Physical activity    Days per week: Not on file    Minutes per session: Not on file  . Stress: Not on file  Relationships  . Social Herbalist on phone: Not on file    Gets together: Not on file    Attends religious service: Not on file  Active member of club or organization: Not on file    Attends meetings of clubs or organizations: Not on file    Relationship status: Not on file  Other Topics Concern  . Not on file  Social History Narrative   Patient lives alone in one story house.   Patient is right handed.   Patient has a college degree.   Patient is retired.     Family History:  The patient's family history includes Alpha-1 antitrypsin deficiency in her daughter; Depression in her father and mother; Heart attack in her mother; Heart failure in her brother and father; Stroke in her brother and father; Sudden death in her mother.   ROS:   Please see the history of present illness.    ROS All other systems reviewed and are negative.   PHYSICAL EXAM:   VS:  BP 125/69   Pulse 85   Temp (!) 97.2 F (36.2 C)   Ht _0  (1.702 m)   Wt 273 lb (123.8 kg)   LMP  (LMP Unknown)   SpO2 96%   BMI 42.76 kg/m    GEN: Well nourished, well developed, in no acute distress  HEENT: normal  Neck: no JVD, carotid bruits, or masses Cardiac: RRR; no murmurs, rubs, or gallops,no edema  Respiratory:  clear to auscultation bilaterally, normal work of breathing GI: soft, nontender, nondistended, + BS MS: no deformity or atrophy  Skin: warm and dry, no rash Neuro:  Alert and Oriented x 3, Strength and sensation are intact Psych: euthymic mood, full affect  Wt Readings from Last 3 Encounters:  07/13/19 273 lb (123.8 kg)  05/10/19 260 lb (117.9 kg)  03/31/19 257 lb (116.6  kg)      Studies/Labs Reviewed:   EKG:  EKG is ordered today.  EKG has been reviewed personally and demonstrated normal sinus rhythm with PACs.  Recent Labs: 09/22/2018: TSH 2.620 10/14/2018: ALT 13; BUN 29; Creatinine, Ser 0.99; Hemoglobin 14.2; Platelets 258.0; Potassium 4.4; Sodium 137   Lipid Panel    Component Value Date/Time   CHOL 176 06/03/2018 1022   TRIG 113 06/03/2018 1022   HDL 53 06/03/2018 1022   CHOLHDL 3 01/15/2017 1417   VLDL 33.6 01/15/2017 1417   LDLCALC 100 (H) 06/03/2018 1022   LDLDIRECT 81.0 07/15/2016 1419    Additional studies/ records that were reviewed today include:   Myoview 12/27/2016 Study Highlights    The study is normal.  This is a low risk study.  There was no ST segment deviation noted during stress.  No T wave inversion was noted during stress.   Low risk stress nuclear study with normal perfusion.  The study was not gated.    Echo 05/18/2019 IMPRESSIONS  1. The left ventricle has low normal systolic function, with an ejection fraction of 50-55%. The cavity size was normal. Left ventricular diastolic function could not be evaluated.  2. The right ventricle has normal systolic function. The cavity was normal.  3. The mitral valve is grossly normal.  4. The tricuspid valve is grossly normal.  5. The aortic valve is tricuspid. Mild thickening of the aortic valve. No stenosis of the aortic valve.  6. Normal LV systolic function; no significant valvular heart disease.    ASSESSMENT:    1. PAF (paroxysmal atrial fibrillation) ([place])   2. Essential hypertension   3. Pacemaker   4. Fibrosis, idiopathic pulmonary ([place])   5. Prediabetes      PLAN:  In order of problems  listed above:  1. PAF: Continue Eliquis.  Maintaining sinus rhythm.  2. Dyspnea: Patient has been having worsening dyspnea.  I will obtain a CBC, CMP, and TSH.  Echocardiogram obtained in June 2020 shows normal EF.  Last Myoview in 2018 was normal  3.  History of pacemaker: Due for remote device interrogation today.  4. Pulmonary fibrosis: Followed by pulmonology service  5. Hypertension: She is not on any blood pressure medication.  Blood pressure stable.  6. Prediabetes: Managed by primary care provider    Medication Adjustments/Labs and Tests Ordered: Current medicines are reviewed at length with the patient today.  Concerns regarding medicines are outlined above.  Medication changes, Labs and Tests ordered today are listed in the Patient Instructions below. Patient Instructions  Medication Instructions:  Start Lasix 20 mg daily. If you need a refill on your cardiac medications before your next appointment, please call your pharmacy.   Lab work: CMET, CBC, TSH next Monday. If you have labs (blood work) drawn today and your tests are completely normal, you will receive your results only by: Marland Kitchen MyChart Message (if you have MyChart) OR . A paper copy in the mail If you have any lab test that is abnormal or we need to change your treatment, we will call you to review the results.  Follow-Up: At [facility], you and your health needs are our priority.  As part of our continuing mission to provide you with exceptional heart care, we have created designated Provider Care Teams.  These Care Teams include your primary Cardiologist (physician) and Advanced Practice Providers (APPs -  Physician Assistants and Nurse Practitioners) who all work together to provide you with the care you need, when you need it. . Follow up with Almyra Deforest, PA next week.        Hilbert Corrigan, [place]  07/14/2019 10:46 PM    [facility] [address], [place], [place]  [address] Phone: [phone]; Fax: [phone]

## 2019-07-13 NOTE — Patient Instructions (Signed)
Medication Instructions:  Start Lasix 20 mg daily. If you need a refill on your cardiac medications before your next appointment, please call your pharmacy.   Lab work: CMET, CBC, TSH next Monday. If you have labs (blood work) drawn today and your tests are completely normal, you will receive your results only by: Marland Kitchen MyChart Message (if you have MyChart) OR . A paper copy in the mail If you have any lab test that is abnormal or we need to change your treatment, we will call you to review the results.  Follow-Up: At [facility], you and your health needs are our priority.  As part of our continuing mission to provide you with exceptional heart care, we have created designated Provider Care Teams.  These Care Teams include your primary Cardiologist (physician) and Advanced Practice Providers (APPs -  Physician Assistants and Nurse Practitioners) who all work together to provide you with the care you need, when you need it. . Follow up with Almyra Deforest, PA next week.

## 2019-07-14 ENCOUNTER — Encounter: Payer: Self-pay | Admitting: Physician Assistant

## 2019-07-14 DIAGNOSIS — M25571 Pain in right ankle and joints of right foot: Secondary | ICD-10-CM | POA: Diagnosis not present

## 2019-07-14 DIAGNOSIS — M25562 Pain in left knee: Secondary | ICD-10-CM | POA: Diagnosis not present

## 2019-07-14 DIAGNOSIS — M1990 Unspecified osteoarthritis, unspecified site: Secondary | ICD-10-CM | POA: Diagnosis not present

## 2019-07-14 DIAGNOSIS — I5020 Unspecified systolic (congestive) heart failure: Secondary | ICD-10-CM | POA: Diagnosis not present

## 2019-07-14 DIAGNOSIS — R2681 Unsteadiness on feet: Secondary | ICD-10-CM | POA: Diagnosis not present

## 2019-07-14 DIAGNOSIS — M6281 Muscle weakness (generalized): Secondary | ICD-10-CM | POA: Diagnosis not present

## 2019-07-15 ENCOUNTER — Other Ambulatory Visit (HOSPITAL_COMMUNITY)
Admission: RE | Admit: 2019-07-15 | Discharge: 2019-07-15 | Disposition: A | Discharge status: Home / Self Care | Payer: Medicare Other | Source: Ambulatory Visit | Attending: Internal Medicine | Admitting: Internal Medicine

## 2019-07-15 ENCOUNTER — Telehealth: Payer: Self-pay

## 2019-07-15 DIAGNOSIS — M1712 Unilateral primary osteoarthritis, left knee: Secondary | ICD-10-CM | POA: Diagnosis not present

## 2019-07-15 DIAGNOSIS — Z01812 Encounter for preprocedural laboratory examination: Secondary | ICD-10-CM | POA: Insufficient documentation

## 2019-07-15 DIAGNOSIS — Z20828 Contact with and (suspected) exposure to other viral communicable diseases: Secondary | ICD-10-CM | POA: Insufficient documentation

## 2019-07-15 DIAGNOSIS — G5761 Lesion of plantar nerve, right lower limb: Secondary | ICD-10-CM | POA: Diagnosis not present

## 2019-07-15 DIAGNOSIS — M25562 Pain in left knee: Secondary | ICD-10-CM | POA: Diagnosis not present

## 2019-07-15 LAB — SARS CORONAVIRUS 2 (TAT 6-24 HRS): SARS Coronavirus 2: NEGATIVE

## 2019-07-15 NOTE — Telephone Encounter (Signed)
Can you check on this for me?

## 2019-07-15 NOTE — Telephone Encounter (Signed)
Copied from [place] [id number]. Topic: General - Other >> Jul 15, 2019 11:49 AM Leward Quan A wrote: Reason for CRM: Patient called to inform Sheria Lang and Dr Juleen China that she have not yet heard from the Pulmonary group with Dr Sammuel Bailiff. Asking for some feed back please patient Ph#  [phone]

## 2019-07-15 NOTE — Telephone Encounter (Signed)
Pulmonary canceled the referral because she is already established with Dr Annamaria Boots.  She has a follow up with him next week.  Her and I have talked and she knows that Ramaswamy isn't in the office right now.  She didn't want to see another provider - she said she would wait for him to return.

## 2019-07-15 NOTE — Progress Notes (Signed)
Thanks

## 2019-07-16 DIAGNOSIS — R2681 Unsteadiness on feet: Secondary | ICD-10-CM | POA: Diagnosis not present

## 2019-07-16 DIAGNOSIS — I5020 Unspecified systolic (congestive) heart failure: Secondary | ICD-10-CM | POA: Diagnosis not present

## 2019-07-16 DIAGNOSIS — M25562 Pain in left knee: Secondary | ICD-10-CM | POA: Diagnosis not present

## 2019-07-16 DIAGNOSIS — M1990 Unspecified osteoarthritis, unspecified site: Secondary | ICD-10-CM | POA: Diagnosis not present

## 2019-07-16 DIAGNOSIS — M6281 Muscle weakness (generalized): Secondary | ICD-10-CM | POA: Diagnosis not present

## 2019-07-16 DIAGNOSIS — M25571 Pain in right ankle and joints of right foot: Secondary | ICD-10-CM | POA: Diagnosis not present

## 2019-07-19 ENCOUNTER — Encounter: Payer: Self-pay | Admitting: Internal Medicine

## 2019-07-19 ENCOUNTER — Ambulatory Visit (INDEPENDENT_AMBULATORY_CARE_PROVIDER_SITE_OTHER): Payer: Medicare Other | Admitting: Internal Medicine

## 2019-07-19 ENCOUNTER — Other Ambulatory Visit: Payer: Self-pay

## 2019-07-19 VITALS — BP 122/60 | HR 93 | Temp 97.2°F | Ht 66.0 in | Wt 265.0 lb

## 2019-07-19 DIAGNOSIS — J449 Chronic obstructive pulmonary disease, unspecified: Secondary | ICD-10-CM

## 2019-07-19 DIAGNOSIS — R29898 Other symptoms and signs involving the musculoskeletal system: Secondary | ICD-10-CM

## 2019-07-19 DIAGNOSIS — R2681 Unsteadiness on feet: Secondary | ICD-10-CM | POA: Diagnosis not present

## 2019-07-19 DIAGNOSIS — J8410 Pulmonary fibrosis, unspecified: Secondary | ICD-10-CM

## 2019-07-19 DIAGNOSIS — I2510 Atherosclerotic heart disease of native coronary artery without angina pectoris: Secondary | ICD-10-CM | POA: Diagnosis not present

## 2019-07-19 DIAGNOSIS — R918 Other nonspecific abnormal finding of lung field: Secondary | ICD-10-CM | POA: Diagnosis not present

## 2019-07-19 DIAGNOSIS — M25571 Pain in right ankle and joints of right foot: Secondary | ICD-10-CM | POA: Diagnosis not present

## 2019-07-19 DIAGNOSIS — I5020 Unspecified systolic (congestive) heart failure: Secondary | ICD-10-CM | POA: Diagnosis not present

## 2019-07-19 DIAGNOSIS — M6281 Muscle weakness (generalized): Secondary | ICD-10-CM | POA: Diagnosis not present

## 2019-07-19 DIAGNOSIS — J849 Interstitial pulmonary disease, unspecified: Secondary | ICD-10-CM

## 2019-07-19 DIAGNOSIS — M25562 Pain in left knee: Secondary | ICD-10-CM | POA: Diagnosis not present

## 2019-07-19 DIAGNOSIS — M1990 Unspecified osteoarthritis, unspecified site: Secondary | ICD-10-CM | POA: Diagnosis not present

## 2019-07-19 LAB — PULMONARY FUNCTION TEST
DL/VA % pred: 104 %
DL/VA: 4.22 ml/min/mmHg/L
DLCO unc % pred: 81 %
DLCO unc: 16.52 ml/min/mmHg
FEF 25-75 Post: 3.63 L/sec
FEF 25-75 Pre: 3.48 L/sec
FEF2575-%Change-Post: 4 %
FEF2575-%Pred-Post: 222 %
FEF2575-%Pred-Pre: 214 %
FEV1-%Change-Post: 0 %
FEV1-%Pred-Post: 106 %
FEV1-%Pred-Pre: 107 %
FEV1-Post: 2.35 L
FEV1-Pre: 2.36 L
FEV1FVC-%Change-Post: 3 %
FEV1FVC-%Pred-Pre: 116 %
FEV6-%Change-Post: -3 %
FEV6-%Pred-Post: 94 %
FEV6-%Pred-Pre: 97 %
FEV6-Post: 2.63 L
FEV6-Pre: 2.74 L
FEV6FVC-%Change-Post: 0 %
FEV6FVC-%Pred-Post: 105 %
FEV6FVC-%Pred-Pre: 105 %
FVC-%Change-Post: -4 %
FVC-%Pred-Post: 89 %
FVC-%Pred-Pre: 93 %
FVC-Post: 2.63 L
FVC-Pre: 2.74 L
Post FEV1/FVC ratio: 90 %
Post FEV6/FVC ratio: 100 %
Pre FEV1/FVC ratio: 86 %
Pre FEV6/FVC Ratio: 100 %
RV % pred: 52 %
RV: 1.3 L
TLC % pred: 74 %
TLC: 3.97 L

## 2019-07-19 NOTE — Patient Instructions (Signed)
Order- referral to Dr Chase Caller for second opinion on interstitial lung disease  I hope they can do more to help with your knee pain so you can be more active. I think that will help your stamina.  Please call if we can help

## 2019-07-19 NOTE — Progress Notes (Signed)
PFT done today. 

## 2019-07-19 NOTE — Progress Notes (Signed)
HPI  female former smoker followed for dyspnea on exertion, lung nodules, fibrosis, complicated by A. 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 She does not qualify for portable oxygen CT chest 01/06/17-pulmonary fibrosis, small nodules ANA 1:160 PFT 07/19/2019- mild restriction. FVC 2.94/ 93%, FEV1 2.20/ 107%, R .86, FEF25-75% 3.48/ 110%, TLC 74%, DLCO 81%, no resp to BD ------------------------------------------------------------------------------------------  09/21/2018- 78 year old female former smoker followed for dyspnea on exertion, lung nodules, fibrosis, ANA 4:782, complicated by A. 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 Feeling Sob with activity today. Previously completed pulmonary rehab. Body weight 250 lbs No acute changes in her breathing.  Notices a full/bloated feeling especially after meals, but little wheeze or cough.  Still followed by cardiology for A. Fib/pacemaker. Gradually adjusting to loss of daughter who died from end-stage lung disease related to her alpha-1 antitrypsin status. CT chest 04/03/2018- IMPRESSION: 1. Pulmonary nodules are stable to slightly decreased back to 01/06/2017 chest CT, considered benign. 2. 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 No frank honeycombing. No convincing progression since 01/06/2017 high-resolution chest CT study. 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 Usual interstitial pneumonia is unlikely given absence of progression and presence of air trapping. 3. Stable mild patchy air trapping in  both lungs indicative of small airways disease. Aortic Atherosclerosis (ICD10-I70.0).  07/19/2019- 78 year old female former smoker followed for dyspnea on exertion, lung nodules, fibrosis, ANA 9:562, complicated by A. 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 TKR deferred due to Covid. Activity limited to ADLs and not losing weight.  Discussed her CT showing mild stable fibrosis. Also reviewed Echo and PFT. She is anxious, still grieving death of daughter from a1AT resp failure. She presses me to support trial of glutathione because it made her daughter "look better in the face". She wants to see Dr Chase Caller for his expertise re interstitial lung disease, although she is aware of mild chronic stable fibrosis and stable nodules.   PFT 07/19/2019- mild restriction. FVC 2.94/ 93%, FEV1 2.20/ 107%, R .86, FEF25-75% 3.48/ 110%, TLC 74%, DLCO 81%, no resp to BD  ECHO- 05/18/2019-  1. The left ventricle has low normal systolic function, with an ejection fraction of 50-55%. The cavity size was normal. Left ventricular diastolic function could not be evaluated.  2. The right ventricle has normal systolic function. The cavity was normal.  3. The mitral valve is grossly normal.  4. The tricuspid valve is grossly normal.  5. The aortic valve is tricuspid. Mild thickening of the aortic valve. No stenosis of the aortic valve.  6. Normal LV systolic function; no significant valvular heart disease.  CT chest 04/05/2019- IMPRESSION: No acute cardiopulmonary disease. Mild chronic stable fibrotic change. Several small subcentimeter mostly peripheral pulmonary nodules with the largest measuring 7 mm over the posterolateral right lower lobe. These are unchanged for greater than 2 years and therefore considered benign. Aortic Atherosclerosis (ICD10-I70.0).  ROS-see HPI   + =  positive Constitutional:    weight loss, night sweats, fevers, chills, fatigue, lassitude. HEENT:    headaches, difficulty swallowing,  tooth/dental problems, sore throat,       sneezing, itching, ear ache, nasal congestion, post nasal drip, snoring CV:    chest pain, orthopnea, PND, swelling in lower extremities, anasarca,                                                dizziness, palpitations Resp:   +shortness of breath with exertion or at rest.                +productive cough,   non-productive cough, coughing up of blood.              change in color of mucus.  wheezing.   Skin:    rash or lesions. GI:  No-   heartburn, indigestion, abdominal pain, nausea, vomiting, diarrhea,                 change in bowel habits, loss of appetite GU: dysuria, change in color of urine, no urgency or frequency.   flank pain. MS:   joint pain, stiffness, decreased range of motion, back pain. Neuro-     nothing unusual Psych:  change in mood or affect.  depression or anxiety.   memory loss.  OBJ- Physical Exam General- Alert, Oriented, Affect-appropriate, Distress- none acute, + obese Skin- rash-none, lesions- none, excoriation- none Lymphadenopathy- none Head- atraumatic            Eyes- Gross vision intact, PERRLA, conjunctivae and secretions clear            Ears- Hearing, canals-normal            Nose- Clear, no-Septal dev, mucus, polyps, erosion, perforation             Throat- Mallampati II , mucosa clear , drainage- none, tonsils- atrophic Neck- flexible , trachea midline, no stridor , thyroid nl, carotid no bruit Chest - symmetrical excursion , unlabored           Heart/CV- RRR , no murmur , no gallop  , no rub, nl s1 s2                           - JVD- none , edema- none, stasis changes- none, varices- none           Lung- + clear- no crackles heard, wheeze- none, cough- none , dullness-none, rub- none           Chest wall- +Pacemaker Left Abd-  Br/ Gen/ Rectal- Not done, not  indicated Extrem- cyanosis- none, clubbing, none, atrophy- none, strength- nl Neuro- grossly intact to observation

## 2019-07-21 ENCOUNTER — Other Ambulatory Visit: Payer: Self-pay

## 2019-07-21 ENCOUNTER — Ambulatory Visit (INDEPENDENT_AMBULATORY_CARE_PROVIDER_SITE_OTHER): Payer: Medicare Other | Admitting: Internal Medicine

## 2019-07-21 ENCOUNTER — Encounter: Payer: Self-pay | Admitting: Internal Medicine

## 2019-07-21 VITALS — BP 130/62 | HR 83 | Temp 97.1°F | Ht 66.0 in | Wt 268.2 lb

## 2019-07-21 DIAGNOSIS — R0609 Other forms of dyspnea: Secondary | ICD-10-CM | POA: Diagnosis not present

## 2019-07-21 DIAGNOSIS — I5189 Other ill-defined heart diseases: Secondary | ICD-10-CM | POA: Diagnosis not present

## 2019-07-21 DIAGNOSIS — I2510 Atherosclerotic heart disease of native coronary artery without angina pectoris: Secondary | ICD-10-CM | POA: Diagnosis not present

## 2019-07-21 DIAGNOSIS — J849 Interstitial pulmonary disease, unspecified: Secondary | ICD-10-CM

## 2019-07-21 DIAGNOSIS — I480 Paroxysmal atrial fibrillation: Secondary | ICD-10-CM | POA: Diagnosis not present

## 2019-07-21 NOTE — Progress Notes (Signed)
e former smoker followed for dyspnea on exertion, lung nodules, fibrosis, complicated by A. 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 She does not qualify for portable oxygen CT chest 01/06/17-pulmonary fibrosis, small nodules ANA 1:160 PFT 07/19/2019- mild restriction. FVC 2.94/ 93%, FEV1 2.20/ 107%, R .86, FEF25-75% 3.48/ 110%, TLC 74%, DLCO 81%, no resp to BD ------------------------------------------------------------------------------------------  09/21/2018- 78 year old female former smoker followed for dyspnea on exertion, lung nodules, fibrosis, ANA 2:595, complicated by A. 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 Feeling Sob with activity today. Previously completed pulmonary rehab. Body weight 250 lbs No acute changes in her breathing.  Notices a full/bloated feeling especially after meals, but little wheeze or cough.  Still followed by cardiology for A. Fib/pacemaker. Gradually adjusting to loss of daughter who died from end-stage lung disease related to her alpha-1 antitrypsin status. CT chest 04/03/2018- IMPRESSION: 1. Pulmonary nodules are stable to slightly decreased back to 01/06/2017 chest CT, considered benign. 2. 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 No frank honeycombing. No convincing progression since 01/06/2017 high-resolution chest CT study. 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 Usual interstitial pneumonia is unlikely given absence of progression and presence of air trapping. 3. Stable mild patchy air trapping in  both lungs indicative of small airways disease. Aortic Atherosclerosis (ICD10-I70.0).  07/19/2019- 78 year old female former smoker followed for dyspnea on exertion, lung nodules, fibrosis, ANA 6:387, complicated by A. 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 TKR deferred due to Covid. Activity limited to ADLs and not losing weight.  Discussed her CT showing mild stable fibrosis. Also reviewed Echo and PFT. She is anxious, still grieving death of daughter from a1AT resp failure. She presses me to support trial of glutathione because it made her daughter "look better in the face". She wants to see [name] [name] for his expertise re interstitial lung disease, although she is aware of mild chronic stable fibrosis and stable nodules.   PFT 07/19/2019- mild restriction. FVC 2.94/ 93%, FEV1 2.20/ 107%, R .86, FEF25-75% 3.48/ 110%, TLC 74%, DLCO 81%, no resp to BD  ECHO- 05/18/2019-  1. The left ventricle has low normal systolic function, with an ejection fraction of 50-55%. The cavity size was normal. Left ventricular diastolic function could not be evaluated.  2. The right ventricle has normal systolic function. The cavity was normal.  3. The mitral valve is grossly normal.  4. The tricuspid valve is grossly normal.  5. The aortic valve is tricuspid. Mild thickening of the aortic valve. No stenosis of the aortic valve.  6. Normal LV systolic function; no significant valvular heart disease.  CT chest 04/05/2019- IMPRESSION: No acute cardiopulmonary disease. Mild chronic stable fibrotic change. Several small subcentimeter mostly peripheral pulmonary nodules with the largest measuring 7 mm over the posterolateral right lower lobe. These are unchanged for greater than 2 years and therefore considered benign. Aortic Atherosclerosis (ICD10-I70.0).   OV 07/21/2019   Subjective:  [name], female , DOB: 1941-01-06 , age 78 y.o. y.o. , MRN: [id number] ,  ADDRESS: [address] [place] [address]   07/21/2019 -   Chief Complaint  Patient presents with  . Follow-up    SOB worsening over past 6 months - increased weight gain     HPI [name] [name] 78 y.o. -is a friend of [name] 1 of my pulmonary fibrosis patients.  She is also a patient of [name] [name] in our clinic as described above.  She is here to the ILD clinic for second opinion about her pulmonary fibrosis.  She tells me that she was diagnosed with pulmonary fibrosis a few years ago and it has been stable.  Is been of insidious onset.  She had limited autoimmune profile as documented below in 2018 when she had a positive ANA but of low titer.  Her dyspnea is been stable.  She is dealing with other medical issues such as obesity, using a walker because of knee issues and a stroke and also death of her daughter from alpha-1 antitrypsin disease approximately 2 years ago.  She says she has some dyspnea on exertion relieved by rest but overall this is stable.  The dyspnea is definitely significant and out of proportion to the level of ILD as documented by her pulmonary function test which shows stability.  Her CT high-resolution that I was personally visualized from a year ago and his CT chest from 2020 showed bilateral bibasal reticular pattern but no honeycombing or traction bronchiectasis.  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  I do not see any features of alternate diagnosis.  She denies any autoimmune features.  She denies having mold in the house of pet birds.  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    SYMPTOM SCALE - ILD 07/21/2019   O2 use ra  Shortness of Breath 0 -> 5 scale with 5 being worst (score 6 If unable to do)  At rest 0  Simple tasks - showers, clothes change, eating, shaving 4  Household (dishes, doing bed, laundry) 4   Shopping 5  Walking level at own pace 5  Walking keeping up with others of same age 53  Walking up Stairs 5  Walking up [place] 5  Total (40 - 48) Dyspnea Score 33  How bad is your cough? 1  How bad is your fatigue 4     Simple office walk 185 feet x  3 laps goal with forehead probe 07/21/2019   O2 used ra  Number laps completed 3 laps  Comments about pace modertae with walker  Resting Pulse Ox/HR 97% and 78/min  Final Pulse Ox/HR 93% and 109/min  Desaturated </= 88% no  Desaturated <= 3% points Yes, 4  Got Tachycardic >/= 90/min yes  Symptoms at end of test dyspnea  Miscellaneous comments x        Results for JAMILETTE, SUCHOCKI (MRN [id number]) as of 07/21/2019 15:57  Ref. Range 02/24/2017 11:58 07/19/2019 15:20  FVC-Pre Latest Units: L 2.58 2.74  FVC-%Pred-Pre Latest Units: % 85 93  Results for WENDEE, HATA (MRN [id number]) as of 07/21/2019 15:57  Ref. Range 02/24/2017 11:58 07/19/2019 15:20  DLCO unc Latest Units: ml/min/mmHg 16.15 16.52  DLCO unc % pred Latest Units: % 59 81    Results for SHANEECE, STOCKBURGER (MRN [id number]) as of 07/21/2019 15:57  Ref. Range 02/24/2017 15:55  Anti Nuclear Antibody (ANA) Latest Ref Range: NEGATIVE  POS (A)  ANA Pattern 1 Unknown HOMOGENEOUS (A)  ANA Titer  1 Latest Units: titer 1:160 (H)  RA Latex Turbid. Latest Ref Range: <14 IU/mL <14    ROS - per HPI     has a past medical history of Alpha-1-antitrypsin deficiency carrier, Arthritis, Atrial fibrillation ([place]), Back pain, Complication of anesthesia, Frequent UTI, GERD (gastroesophageal reflux disease), Hypertension, MVP (mitral valve prolapse), Obesity, Osteoarthritis, Presence of permanent cardiac pacemaker, PVC's (premature ventricular contractions), Shortness of breath, Sinus arrest (10/2014), Sleep apnea, Stroke ([place]) (01/2014), Tachy-brady syndrome ([place]) (10/2014), and TIA (transient ischemic attack).   reports that she quit smoking about 49 years ago. Her smoking use included cigarettes.  She has a 36.00 pack-year smoking history. She has never used smokeless tobacco.  Past Surgical History:  Procedure Laterality Date  . CARDIOVERSION N/A 09/02/2017   Procedure: CARDIOVERSION;  Surgeon: Sanda Klein, MD;  Location: [facility];  Service: Cardiovascular;  Laterality: N/A;  . EXCISION VAGINAL CYST     benign nodules  . INSERT / REPLACE / REMOVE PACEMAKER  10/04/2014   Medtronic Advisa model [id number] serial number [id number]  . LOOP RECORDER EXPLANT N/A 10/04/2014   Procedure: LOOP RECORDER EXPLANT;  Surgeon: Sanda Klein, MD;  Location: [facility];  Service: Cardiovascular;  Laterality: N/A;  . LOOP RECORDER IMPLANT N/A 04/19/2014   Procedure: LOOP RECORDER IMPLANT;  Surgeon: Sanda Klein, MD;  Location: [facility];  Service: Cardiovascular;  Laterality: N/A;  . NM MYOCAR PERF WALL MOTION  12/18/2007   normal  . PERMANENT PACEMAKER INSERTION N/A 10/04/2014   Procedure: PERMANENT PACEMAKER INSERTION;  Surgeon: Sanda Klein, MD;  Location: [facility];  Service: Cardiovascular;  Laterality: N/A;  . TUBAL LIGATION    . US ECHOCARDIOGRAPHY  05/15/2010   LA mildly dilated,mild mitral annular ca+, AOV mildly sclerotic, mild asymmetric LVH    Allergies  Allergen Reactions  . Ancef [Cefazolin] Hives and Itching  . Pseudoephedrine Hcl     Other reaction(s): palpitations (moderate)  . Ergotamine   . Pentobarbital   . Caffeine Palpitations    Immunization History  Administered Date(s) Administered  . Influenza Whole 09/23/2008  . Influenza, High Dose Seasonal PF 09/13/2018  . Influenza,inj,quad, With Preservative 09/04/2017  . Influenza-Unspecified 09/17/2014, 09/09/2016, 09/04/2017  . Pneumococcal Conjugate-13 10/16/2015  . Pneumococcal Polysaccharide-23 10/02/2017  . Pneumococcal-Unspecified 10/01/2014, 10/02/2017  . Zoster Recombinat (Shingrix) 09/29/2018, 01/19/2019    Family History  Problem Relation Age of Onset  . Heart attack Mother   . Sudden death  Mother   . Depression Mother   . Stroke Father   . Heart failure Father   . Depression Father   . Heart failure Brother   . Stroke Brother   . Alpha-1 antitrypsin deficiency Daughter      Current Outpatient Medications:  .  apixaban (ELIQUIS) 5 MG TABS tablet, Take 1 tablet (5 mg total) by mouth 2 (two) times daily., Disp: 180 tablet, Rfl: 1 .  Biotin 5000 MCG CAPS, Take 5,000 mcg by mouth daily. , Disp: , Rfl:  .  Calcium Carbonate-Vit D-Min (CALCIUM 1200 PO), Take 2,400 mg by mouth daily. , Disp: , Rfl:  .  cholecalciferol (VITAMIN D) 1000 UNITS tablet, Take 1,000 Units by mouth daily. , Disp: , Rfl:  .  ciprofloxacin (CIPRO) 250 MG tablet, Take 1 tablet (250 mg total) by mouth 2 (two) times daily., Disp: 6 tablet, Rfl: 0 .  Co-Enzyme Q10 100 MG CAPS, Take 100 mg by mouth daily. , Disp: , Rfl:  .  diazepam (VALIUM) 5 MG tablet, Take 1  tablet po daily, Disp: 30 tablet, Rfl: 1 .  fluticasone (FLONASE) 50 MCG/ACT nasal spray, Place 2 sprays into both nostrils daily., Disp: 16 g, Rfl: 1 .  folic acid (FOLVITE) 845 MCG tablet, Take 400 mcg by mouth daily., Disp: , Rfl:  .  furosemide (LASIX) 20 MG tablet, Take 1 tablet (20 mg total) by mouth daily., Disp: 90 tablet, Rfl: 1 .  glucosamine-chondroitin 500-400 MG tablet, Take 1 tablet by mouth daily. , Disp: , Rfl:  .  loratadine (KLS ALLERCLEAR) 10 MG tablet, Take 10 mg by mouth daily., Disp: , Rfl:  .  MAGNESIUM CITRATE PO, Take 250 mg by mouth daily. 1 tab daily, Disp: , Rfl:  .  nitroGLYCERIN (NITROSTAT) 0.4 MG SL tablet, Place 0.4 mg under the tongue every 5 (five) minutes as needed for chest pain., Disp: , Rfl:  .  omeprazole (PRILOSEC) 20 MG capsule, Take 20 mg by mouth daily., Disp: , Rfl:  .  OVER THE COUNTER MEDICATION, CBD oil, Disp: , Rfl:  .  Resveratrol 250 MG CAPS, Take 250 mg by mouth daily., Disp: , Rfl:  .  sertraline (ZOLOFT) 100 MG tablet, Take 1 tablet by mouth once daily, Disp: 90 tablet, Rfl: 0      Objective:    Vitals:   07/21/19 1522  BP: 130/62  Pulse: 83  Temp: (!) 97.1 F (36.2 C)  SpO2: 95%  Weight: 268 lb 3.2 oz (121.7 kg)  Height: _0  (1.676 m)    Estimated body mass index is 43.29 kg/m as calculated from the following:   Height as of this encounter: _1  (1.676 m).   Weight as of this encounter: 268 lb 3.2 oz (121.7 kg).  _2 @  [employer]   07/21/19 1522  Weight: 268 lb 3.2 oz (121.7 kg)     Physical Exam  General Appearance:    Alert, cooperative, no distress, appears stated age - yes , Deconditioned looking - no , OBESE  - yes, Sitting on Wheelchair -  no  Head:    Normocephalic, without obvious abnormality, atraumatic  Eyes:    PERRL, conjunctiva/corneas clear,  Ears:    Normal TM's and external ear canals, both ears  Nose:   Nares normal, septum midline, mucosa normal, no drainage    or sinus tenderness. OXYGEN ON  - no . Patient is @ ra   Throat:   Lips, mucosa, and tongue normal; teeth and gums normal. Cyanosis on lips - no  Neck:   Supple, symmetrical, trachea midline, no adenopathy;    thyroid:  no enlargement/tenderness/nodules; no carotid   bruit or JVD  Back:     Symmetric, no curvature, ROM normal, no CVA tenderness  Lungs:     Distress - no , Wheeze no, Barrell Chest - no, Purse lip breathing - no, Crackles - yes at base   Chest Wall:    No tenderness or deformity.    Heart:    Regular rate and rhythm, S1 and S2 normal, no rub   or gallop, Murmur - no  Breast Exam:    NOT DONE  Abdomen:     Soft, non-tender, bowel sounds active all four quadrants,    no masses, no organomegaly. Visceral obesity - yes  Genitalia:   NOT DONE  Rectal:   NOT DONE  Extremities:   Extremities - normal, Has Cane - no, Clubbing - no, Edema - no. WALKER +  Pulses:   2+ and symmetric all extremities  Skin:  Stigmata of Connective Tissue Disease - no  Lymph nodes:   Cervical, supraclavicular, and axillary nodes normal  Psychiatric:  Neurologic:   Pleasant - yes,  Anxious - no, Flat affect - no  CAm-ICU - neg, Alert and Oriented x 3 - yes, Moves all 4s - yes, Speech - normal, Cognition - intact           Assessment:       ICD-10-CM   1. ILD (interstitial lung disease) ([place])  J84.9        Plan:     Patient Instructions     ICD-10-CM   1. ILD (interstitial lung disease) ([place])  J84.9    You have mild form of interstitial lung disease aka pulmonary fibrosis Has been stable for 2 years or more Unlikely to progress based on history so far - so a non- or slow-progressive variety However, always possible that this is a progressive variety like IPF currently quiet  PLAN  - we need to identify the type and reasn for fibrosis  - To this end  - do ILD questionnaire - today or another day    - do blood Serum: ESR, ACE, ANA, DS-DNA, RF, anti-CCP,  ANCA screen, MPO, PR-3, Total CK,  Aldolase,  ENP Panel ( ensure includes -> scl-70, ssA, ssB, anti-RNP, anti-JO-1), & Hypersensitivity Pneumonitis Panel  Followup  - 4-8 weeks to review results and questionnaire to decide next steps if we need to doi biopsy or manage expectantly    - 30 min time slot   > 50% of this > 40 min visit spent in face to face counseling or/and coordination of care - by this undersigned MD - [name] Brand Males. This includes one or more of the following documented above: discussion of test results, diagnostic or treatment recommendations, prognosis, risks and benefits of management options, instructions, education, compliance or risk-factor reduction   SIGNATURE    [name]. Brand Males, M.D., F.C.C.P,  Pulmonary and Critical Care Medicine Staff Physician, [place] Director - Interstitial Lung Disease  Program  [facility] [place], [place], [address]  Pager: [phone], If no answer or between  15:00h - 7:00h: call [phone] Telephone: [phone]  4:31 PM 07/21/2019

## 2019-07-21 NOTE — Patient Instructions (Addendum)
ICD-10-CM   1. ILD (interstitial lung disease) ([place])  J84.9    You have mild form of interstitial lung disease aka pulmonary fibrosis Has been stable for 2 years or more Unlikely to progress based on history so far - so a non- or slow-progressive variety However, always possible that this is a progressive variety like IPF currently quiet  PLAN  - we need to identify the type and reasn for fibrosis  - To this end  - do ILD questionnaire - today or another day    - do blood Serum: ESR, ACE, ANA, DS-DNA, RF, anti-CCP,  ANCA screen, MPO, PR-3, Total CK,  Aldolase,  ENP Panel ( ensure includes -> scl-70, ssA, ssB, anti-RNP, anti-JO-1), & Hypersensitivity Pneumonitis Panel  Followup  - 4-8 weeks to review results and questionnaire to decide next steps if we need to doi biopsy or manage expectantly    - 30 min time slot

## 2019-07-22 LAB — COMPREHENSIVE METABOLIC PANEL
ALT: 14 IU/L (ref 0–32)
AST: 22 IU/L (ref 0–40)
Albumin/Globulin Ratio: 1.7 (ref 1.2–2.2)
Albumin: 4.1 g/dL (ref 3.7–4.7)
Alkaline Phosphatase: 80 IU/L (ref 39–117)
BUN/Creatinine Ratio: 27 (ref 12–28)
BUN: 26 mg/dL (ref 8–27)
Bilirubin Total: 0.3 mg/dL (ref 0.0–1.2)
CO2: 23 mmol/L (ref 20–29)
Calcium: 9.4 mg/dL (ref 8.7–10.3)
Chloride: 99 mmol/L (ref 96–106)
Creatinine, Ser: 0.98 mg/dL (ref 0.57–1.00)
GFR calc Af Amer: 64 mL/min/{1.73_m2} (ref >59)
GFR calc non Af Amer: 55 mL/min/{1.73_m2} — ABNORMAL LOW (ref >59)
Globulin, Total: 2.4 g/dL (ref 1.5–4.5)
Glucose: 101 mg/dL — ABNORMAL HIGH (ref 65–99)
Potassium: 4.4 mmol/L (ref 3.5–5.2)
Sodium: 138 mmol/L (ref 134–144)
Total Protein: 6.5 g/dL (ref 6.0–8.5)

## 2019-07-22 LAB — TSH: TSH: 2.79 u[IU]/mL (ref 0.450–4.500)

## 2019-07-22 LAB — CBC
Hematocrit: 41.9 % (ref 34.0–46.6)
Hemoglobin: 14.1 g/dL (ref 11.1–15.9)
MCH: 28.4 pg (ref 26.6–33.0)
MCHC: 33.7 g/dL (ref 31.5–35.7)
MCV: 85 fL (ref 79–97)
Platelets: 275 10*3/uL (ref 150–450)
RBC: 4.96 x10E6/uL (ref 3.77–5.28)
RDW: 12.5 % (ref 11.7–15.4)
WBC: 9.3 10*3/uL (ref 3.4–10.8)

## 2019-07-22 LAB — SEDIMENTATION RATE: Sed Rate: 15 mm/hr (ref 0–30)

## 2019-07-22 LAB — BRAIN NATRIURETIC PEPTIDE: Pro B Natriuretic peptide (BNP): 81 pg/mL (ref 0.0–100.0)

## 2019-07-22 NOTE — Progress Notes (Signed)
Remote pacemaker transmission.

## 2019-07-23 ENCOUNTER — Ambulatory Visit: Payer: Medicare Other | Admitting: Physician Assistant

## 2019-07-23 ENCOUNTER — Ambulatory Visit (INDEPENDENT_AMBULATORY_CARE_PROVIDER_SITE_OTHER): Payer: Medicare Other | Admitting: Physician Assistant

## 2019-07-23 ENCOUNTER — Encounter: Payer: Self-pay | Admitting: Physician Assistant

## 2019-07-23 ENCOUNTER — Other Ambulatory Visit: Payer: Self-pay

## 2019-07-23 VITALS — BP 108/68 | HR 87 | Temp 96.5°F | Ht 67.0 in | Wt 266.0 lb

## 2019-07-23 DIAGNOSIS — Z148 Genetic carrier of other disease: Secondary | ICD-10-CM

## 2019-07-23 DIAGNOSIS — Z950 Presence of cardiac pacemaker: Secondary | ICD-10-CM

## 2019-07-23 DIAGNOSIS — I2510 Atherosclerotic heart disease of native coronary artery without angina pectoris: Secondary | ICD-10-CM | POA: Diagnosis not present

## 2019-07-23 DIAGNOSIS — J84112 Idiopathic pulmonary fibrosis: Secondary | ICD-10-CM | POA: Diagnosis not present

## 2019-07-23 DIAGNOSIS — R7303 Prediabetes: Secondary | ICD-10-CM

## 2019-07-23 DIAGNOSIS — I480 Paroxysmal atrial fibrillation: Secondary | ICD-10-CM

## 2019-07-23 DIAGNOSIS — I10 Essential (primary) hypertension: Secondary | ICD-10-CM | POA: Diagnosis not present

## 2019-07-23 DIAGNOSIS — R600 Localized edema: Secondary | ICD-10-CM | POA: Diagnosis not present

## 2019-07-23 NOTE — Patient Instructions (Addendum)
Medication Instructions:  Your physician recommends that you continue on your current medications as directed. Please refer to the Current Medication list given to you today.  If you need a refill on your cardiac medications before your next appointment, please call your pharmacy.   Lab work: NONE ordered at this time of appointment   If you have labs (blood work) drawn today and your tests are completely normal, you will receive your results only by: Marland Kitchen MyChart Message (if you have MyChart) OR . A paper copy in the mail If you have any lab test that is abnormal or we need to change your treatment, we will call you to review the results.  Testing/Procedures: NONE ordered at this time of appointment   Follow-Up: At [facility], you and your health needs are our priority.  As part of our continuing mission to provide you with exceptional heart care, we have created designated Provider Care Teams.  These Care Teams include your primary Cardiologist (physician) and Advanced Practice Providers (APPs -  Physician Assistants and Nurse Practitioners) who all work together to provide you with the care you need, when you need it. . You will need a follow up appointment in 4-6 months with Sanda Klein, MD  Any Other Special Instructions Will Be Listed Below (If Applicable).

## 2019-07-23 NOTE — Progress Notes (Signed)
Cardiology Office Note    Date:  07/25/2019   ID:  Mehr, Depaoli 1941-11-30, MRN [id number]  PCP:  Briscoe Deutscher, DO  Cardiologist:  Dr. Sallyanne Kuster   Chief Complaint  Patient presents with  . Follow-up    seen for Dr. Sallyanne Kuster.    History of Present Illness:  [name] [name] is a 78 y.o. 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  She does not have any prior history of CAD although she does have evidence of atherosclerosis of aorta.  Myoview in January 2018 was low risk.  Last device interrogation was on 04/18/2019, device was functioning normally and patient had a low stable burden of A. fib.  Echocardiogram obtained on 05/18/2019 showed EF 50 to 58%, diastolic function could not be evaluated, otherwise no significant valve issue.  Patient was recently seen her physical therapist who noted she had significant bilateral lower extremity edema after a fall.  She was instructed to follow-up with cardiology service immediately.  I last saw the patient on 07/13/2019 for lower extremity edema, I started her on 20 mg daily of Lasix.  Follow-up lab work was stable.,  She returns today for follow-up evaluation.  Lower extremity edema is not very significant based on physical exam today.  She denies any chest pain, orthopnea or PND.  Despite interstitial lung disease, she is able to walk around and maintain O2 saturation of at least 93%.  She continued to have dyspnea on exertion.  She is also having significant discomfort in the knee, this is being followed by orthopedic service.  Otherwise, I recommended continue on the current medication.  She can follow-up with Dr. Sallyanne Kuster in 4 to 6 months.   Past Medical History:  Diagnosis Date  . Alpha-1-antitrypsin deficiency carrier   . Arthritis    "all over"  . Atrial fibrillation ([place])   . Back pain   . Complication of anesthesia    "I woke up during 2 different procedures" (10/04/2014)  . Frequent UTI   . GERD (gastroesophageal reflux disease)   . Hypertension   . MVP (mitral valve prolapse)   . Obesity   . Osteoarthritis   . Presence of permanent cardiac pacemaker   . PVC's (premature ventricular contractions)   . Shortness of breath   . 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 Sleep apnea   . Stroke [facility]) 01/2014   denies deficits on 10/04/2014  . Tachy-brady syndrome ([place]) 10/2014  . TIA (transient ischemic attack)     Past Surgical History:  Procedure Laterality Date  . CARDIOVERSION N/A 09/02/2017   Procedure: CARDIOVERSION;  Surgeon: Sanda Klein, MD;  Location: [facility] ENDOSCOPY;  Service: Cardiovascular;  Laterality: N/A;  . EXCISION VAGINAL CYST     benign nodules  . INSERT / REPLACE / REMOVE PACEMAKER  10/04/2014   Medtronic Advisa model [id number] serial number [id number]  . LOOP RECORDER EXPLANT N/A 10/04/2014   Procedure: LOOP RECORDER EXPLANT;  Surgeon: Sanda Klein, MD;  Location: [place] CATH LAB;  Service: Cardiovascular;  Laterality: N/A;  . LOOP RECORDER IMPLANT N/A 04/19/2014   Procedure: LOOP RECORDER IMPLANT;  Surgeon: Sanda Klein, MD;  Location: [place] CATH LAB;  Service: Cardiovascular;  Laterality: N/A;  . NM MYOCAR PERF WALL MOTION  12/18/2007   normal  . PERMANENT PACEMAKER  INSERTION N/A 10/04/2014   Procedure: PERMANENT PACEMAKER INSERTION;  Surgeon: Sanda Klein, MD;  Location: [place] CATH LAB;  Service: Cardiovascular;  Laterality: N/A;  . TUBAL LIGATION    . US ECHOCARDIOGRAPHY  05/15/2010   LA mildly dilated,mild mitral annular ca+, AOV mildly sclerotic, mild asymmetric LVH    Current Medications: Outpatient Medications Prior to Visit  Medication Sig Dispense Refill  . apixaban (ELIQUIS) 5 MG TABS tablet Take 1 tablet (5 mg total) by mouth 2 (two) times  daily. 180 tablet 1  . Biotin 5000 MCG CAPS Take 5,000 mcg by mouth daily.     . Calcium Carbonate-Vit D-Min (CALCIUM 1200 PO) Take 2,400 mg by mouth daily.     . cholecalciferol (VITAMIN D) 1000 UNITS tablet Take 1,000 Units by mouth daily.     Marland Kitchen Co-Enzyme Q10 100 MG CAPS Take 100 mg by mouth daily.     . diazepam (VALIUM) 5 MG tablet Take 1 tablet po daily 30 tablet 1  . fluticasone (FLONASE) 50 MCG/ACT nasal spray Place 2 sprays into both nostrils daily. 16 g 1  . folic acid (FOLVITE) 425 MCG tablet Take 400 mcg by mouth daily.    . furosemide (LASIX) 20 MG tablet Take 1 tablet (20 mg total) by mouth daily. 90 tablet 1  . glucosamine-chondroitin 500-400 MG tablet Take 1 tablet by mouth daily.     Marland Kitchen loratadine (KLS ALLERCLEAR) 10 MG tablet Take 10 mg by mouth daily.    Marland Kitchen MAGNESIUM CITRATE PO Take 250 mg by mouth daily. 1 tab daily    . nitroGLYCERIN (NITROSTAT) 0.4 MG SL tablet Place 0.4 mg under the tongue every 5 (five) minutes as needed for chest pain.    Marland Kitchen omeprazole (PRILOSEC) 20 MG capsule Take 20 mg by mouth daily.    Marland Kitchen OVER THE COUNTER MEDICATION CBD oil    . Resveratrol 250 MG CAPS Take 250 mg by mouth daily.    . sertraline (ZOLOFT) 100 MG tablet Take 1 tablet by mouth once daily 90 tablet 0  . ciprofloxacin (CIPRO) 250 MG tablet Take 1 tablet (250 mg total) by mouth 2 (two) times daily. (Patient not taking: Reported on 07/23/2019) 6 tablet 0   No facility-administered medications prior to visit.      Allergies:   Ancef [cefazolin], Pseudoephedrine hcl, Ergotamine, Pentobarbital, and Caffeine   Social History   Socioeconomic History  . Marital status: Divorced    Spouse name: Not on file  . Number of children: 3  . Years of education: college  . Highest education level: Not on file  Occupational History  . Occupation: Retired Engineer, site  Social Needs  . Financial resource strain: Not on file  . Food insecurity    Worry: Not on file    Inability: Not on file   . Transportation needs    Medical: Not on file    Non-medical: Not on file  Tobacco Use  . Smoking status: Former Smoker    Packs/day: 2.00    Years: 18.00    Pack years: 36.00    Types: Cigarettes    Quit date: 07/18/1970    Years since quitting: 49.0  . Smokeless tobacco: Never Used  . Tobacco comment: "quit smoking in 1971"  Substance and Sexual Activity  . Alcohol use: Yes    Alcohol/week: 0.0 standard drinks    Comment: 10/04/2014 "might have a drink a couple times/yr"  . Drug use: No  . Sexual activity: Never  Lifestyle  .  Physical activity    Days per week: Not on file    Minutes per session: Not on file  . Stress: Not on file  Relationships  . Social Herbalist on phone: Not on file    Gets together: Not on file    Attends religious service: Not on file    Active member of club or organization: Not on file    Attends meetings of clubs or organizations: Not on file    Relationship status: Not on file  Other Topics Concern  . Not on file  Social History Narrative   Patient lives alone in one story house.   Patient is right handed.   Patient has a college degree.   Patient is retired.     Family History:  The patient's family history includes Alpha-1 antitrypsin deficiency in her daughter; Depression in her father and mother; Heart attack in her mother; Heart failure in her brother and father; Stroke in her brother and father; Sudden death in her mother.   ROS:   Please see the history of present illness.    ROS All other systems reviewed and are negative.   PHYSICAL EXAM:   VS:  BP 108/68   Pulse 87   Temp (!) 96.5 F (35.8 C)   Ht _0  (1.702 m)   Wt 266 lb (120.7 kg)   LMP  (LMP Unknown)   SpO2 94%   BMI 41.66 kg/m    GEN: Well nourished, well developed, in no acute distress  HEENT: normal  Neck: no JVD, carotid bruits, or masses Cardiac: RRR; no murmurs, rubs, or gallops,no edema  Respiratory:  clear to auscultation bilaterally, normal  work of breathing GI: soft, nontender, nondistended, + BS MS: no deformity or atrophy  Skin: warm and dry, no rash Neuro:  Alert and Oriented x 3, Strength and sensation are intact Psych: euthymic mood, full affect  Wt Readings from Last 3 Encounters:  07/23/19 266 lb (120.7 kg)  07/21/19 268 lb 3.2 oz (121.7 kg)  07/19/19 265 lb (120.2 kg)      Studies/Labs Reviewed:   EKG:  EKG is not ordered today.    Recent Labs: 07/21/2019: ALT 14; BUN 26; Creatinine, Ser 0.98; Hemoglobin 14.1; Platelets 275; Potassium 4.4; Pro B Natriuretic peptide (BNP) 81.0; Sodium 138; TSH 2.790   Lipid Panel    Component Value Date/Time   CHOL 176 06/03/2018 1022   TRIG 113 06/03/2018 1022   HDL 53 06/03/2018 1022   CHOLHDL 3 01/15/2017 1417   VLDL 33.6 01/15/2017 1417   LDLCALC 100 (H) 06/03/2018 1022   LDLDIRECT 81.0 07/15/2016 1419    Additional studies/ records that were reviewed today include:   Echo 05/18/2019 IMPRESSIONS  1. The left ventricle has low normal systolic function, with an ejection fraction of 50-55%. The cavity size was normal. Left ventricular diastolic function could not be evaluated.  2. The right ventricle has normal systolic function. The cavity was normal.  3. The mitral valve is grossly normal.  4. The tricuspid valve is grossly normal.  5. The aortic valve is tricuspid. Mild thickening of the aortic valve. No stenosis of the aortic valve.  6. Normal LV systolic function; no significant valvular heart disease.   ASSESSMENT:    1. Leg edema   2. 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 Alpha-1-antitrypsin deficiency carrier   4. Essential hypertension   5. Pacemaker   6. PAF (paroxysmal atrial fibrillation) ([place])   7.  Prediabetes      PLAN:  In order of problems listed above:  1. Leg edema: Renal function stable after addition of 20 mg daily of Lasix.  Lower extremity edema has improved.  2. PAF: On Eliquis.  Maintaining sinus rhythm on no AV nodal  blocking agent. Paced.  Less than 1% A. fib burden on recent interrogation on 07/13/2019  3. History of tachybradycardia syndrome s/p pacemaker: Followed by Dr. Sallyanne Kuster  4. Hypertension: Blood pressure stable on current therapy.  5. Prediabetes: Managed by primary care provider.  Recent hemoglobin A1c in October 2019 was 5.8.  6. Pulmonary fibrosis: Followed by primary care provider    Medication Adjustments/Labs and Tests Ordered: Current medicines are reviewed at length with the patient today.  Concerns regarding medicines are outlined above.  Medication changes, Labs and Tests ordered today are listed in the Patient Instructions below. Patient Instructions  Medication Instructions:  Your physician recommends that you continue on your current medications as directed. Please refer to the Current Medication list given to you today.  If you need a refill on your cardiac medications before your next appointment, please call your pharmacy.   Lab work: NONE ordered at this time of appointment   If you have labs (blood work) drawn today and your tests are completely normal, you will receive your results only by: Marland Kitchen MyChart Message (if you have MyChart) OR . A paper copy in the mail If you have any lab test that is abnormal or we need to change your treatment, we will call you to review the results.  Testing/Procedures: NONE ordered at this time of appointment   Follow-Up: At [facility], you and your health needs are our priority.  As part of our continuing mission to provide you with exceptional heart care, we have created designated Provider Care Teams.  These Care Teams include your primary Cardiologist (physician) and Advanced Practice Providers (APPs -  Physician Assistants and Nurse Practitioners) who all work together to provide you with the care you need, when you need it. . You will need a follow up appointment in 4-6 months with Sanda Klein, MD  Any Other Special  Instructions Will Be Listed Below (If Applicable).       Hilbert Corrigan, [place]  07/25/2019 [address], [place], [place]  [address] Phone: [phone]; Fax: [phone]

## 2019-07-24 LAB — ANTIPROTEINASE 3 (PR-3) ABS: ANCA Proteinase 3: <3.5 U/mL (ref 0.0–3.5)

## 2019-07-24 LAB — ANA+ENA+DNA/DS+ANTICH+CENTR
ANA Titer 1: NEGATIVE
Anti JO-1: <0.2 AI (ref 0.0–0.9)
Centromere Ab Screen: <0.2 AI (ref 0.0–0.9)
Chromatin Ab SerPl-aCnc: <0.2 AI (ref 0.0–0.9)
ENA RNP Ab: <0.2 AI (ref 0.0–0.9)
ENA SM Ab Ser-aCnc: <0.2 AI (ref 0.0–0.9)
ENA SSA (RO) Ab: <0.2 AI (ref 0.0–0.9)
ENA SSB (LA) Ab: 0.3 AI (ref 0.0–0.9)
Scleroderma (Scl-70) (ENA) Antibody, IgG: <0.2 AI (ref 0.0–0.9)
dsDNA Ab: <1 IU/mL (ref 0–9)

## 2019-07-24 NOTE — Assessment & Plan Note (Signed)
She accepts my offer to refer her to Dr Chase Caller for his expertise in ILD.  She presses me to Rx glutathione, which her daughter, who died of a1AT ZZ chronic respiratory failure and prolonged smoking, believed extended her life. I asked her to wait on this, and doubt it has clinical value for her.

## 2019-07-24 NOTE — Assessment & Plan Note (Signed)
Stable and benign based on CT 04/05/19

## 2019-07-24 NOTE — Assessment & Plan Note (Signed)
She is morbidly obese and deconditioned, especially because degenerative osteoarthritis in L knee limits her to walking with rolling walker.

## 2019-07-25 ENCOUNTER — Encounter: Payer: Self-pay | Admitting: Physician Assistant

## 2019-07-26 LAB — HYPERSENSITIVITY PNUEMONITIS PROFILE
ASPERGILLUS FUMIGATUS: NEGATIVE
Faenia retivirgula: NEGATIVE
Pigeon Serum: NEGATIVE
S. VIRIDIS: NEGATIVE
T. CANDIDUS: NEGATIVE
T. VULGARIS: NEGATIVE

## 2019-07-26 LAB — ANTI-SCLERODERMA ANTIBODY: Scleroderma (Scl-70) (ENA) Antibody, IgG: <1 AI

## 2019-07-26 LAB — ANCA SCREEN W REFLEX TITER: ANCA Screen: NEGATIVE

## 2019-07-26 LAB — CK TOTAL AND CKMB (NOT AT ARMC)
CK, MB: 1.7 ng/mL (ref 0–5.0)
Relative Index: 1.7 (ref 0–4.0)
Total CK: 98 U/L (ref 29–143)

## 2019-07-26 LAB — ALDOLASE

## 2019-07-26 LAB — ANTI-NUCLEAR AB-TITER (ANA TITER): ANA Titer 1: 1:160 {titer} — ABNORMAL HIGH

## 2019-07-26 LAB — ANGIOTENSIN CONVERTING ENZYME: Angiotensin-Converting Enzyme: 48 U/L (ref 9–67)

## 2019-07-26 LAB — MPO/PR-3 (ANCA) ANTIBODIES
Myeloperoxidase Abs: <1 AI
Serine Protease 3: <1 AI

## 2019-07-26 LAB — CYCLIC CITRUL PEPTIDE ANTIBODY, IGG: Cyclic Citrullin Peptide Ab: <16 UNITS

## 2019-07-26 LAB — RHEUMATOID FACTOR: Rheumatoid fact SerPl-aCnc: <14 IU/mL (ref <14)

## 2019-07-26 LAB — SJOGRENS SYNDROME-A EXTRACTABLE NUCLEAR ANTIBODY: SSA (Ro) (ENA) Antibody, IgG: <1 AI

## 2019-07-26 LAB — ANA: Anti Nuclear Antibody (ANA): POSITIVE — AB

## 2019-07-26 LAB — SJOGRENS SYNDROME-B EXTRACTABLE NUCLEAR ANTIBODY: SSB (La) (ENA) Antibody, IgG: <1 AI

## 2019-07-27 ENCOUNTER — Telehealth: Payer: Self-pay | Admitting: Internal Medicine

## 2019-07-27 NOTE — Telephone Encounter (Signed)
Called pt and advised message from the provider. Pt understood and verbalized understanding. Nothing further is needed.   Made her an appt with MR on 08/17/2019 at 11:00

## 2019-07-27 NOTE — Telephone Encounter (Signed)
Let [name] [name] know that autoimmune profile Is essentuialy negative except for trace ANA  Positivity - she alredy had it. So as of now cause of ILD is not known  Plan Return per ov notation -  In 4-8 weeks - bring the completed ILD questionnaire at that time - 44 min slot  Thanks    SIGNATURE    Dr. Brand Males, M.D., F.C.C.P,  Pulmonary and Critical Care Medicine Staff Physician, [place] Director - Interstitial Lung Disease  Program  Pulmonary [place] at [place], [place], [address]  Pager: [phone], If no answer or between  15:00h - 7:00h: call [phone] Telephone: [phone]  1:13 PM 07/27/2019

## 2019-07-28 DIAGNOSIS — M6281 Muscle weakness (generalized): Secondary | ICD-10-CM | POA: Diagnosis not present

## 2019-07-28 DIAGNOSIS — M25571 Pain in right ankle and joints of right foot: Secondary | ICD-10-CM | POA: Diagnosis not present

## 2019-07-28 DIAGNOSIS — I5020 Unspecified systolic (congestive) heart failure: Secondary | ICD-10-CM | POA: Diagnosis not present

## 2019-07-28 DIAGNOSIS — M25562 Pain in left knee: Secondary | ICD-10-CM | POA: Diagnosis not present

## 2019-07-28 DIAGNOSIS — M1990 Unspecified osteoarthritis, unspecified site: Secondary | ICD-10-CM | POA: Diagnosis not present

## 2019-07-28 DIAGNOSIS — R2681 Unsteadiness on feet: Secondary | ICD-10-CM | POA: Diagnosis not present

## 2019-07-29 ENCOUNTER — Ambulatory Visit: Payer: Medicare Other | Admitting: Licensed Clinical Social Worker

## 2019-07-30 ENCOUNTER — Telehealth: Payer: Self-pay | Admitting: Family Medicine

## 2019-07-30 ENCOUNTER — Encounter: Payer: Self-pay | Admitting: Family Medicine

## 2019-07-30 ENCOUNTER — Ambulatory Visit: Payer: Medicare Other

## 2019-07-30 ENCOUNTER — Ambulatory Visit (INDEPENDENT_AMBULATORY_CARE_PROVIDER_SITE_OTHER): Payer: Medicare Other | Admitting: Family Medicine

## 2019-07-30 ENCOUNTER — Other Ambulatory Visit: Payer: Self-pay

## 2019-07-30 VITALS — BP 136/60 | Ht 67.0 in | Wt 266.0 lb

## 2019-07-30 DIAGNOSIS — J8410 Pulmonary fibrosis, unspecified: Secondary | ICD-10-CM

## 2019-07-30 DIAGNOSIS — G8929 Other chronic pain: Secondary | ICD-10-CM

## 2019-07-30 DIAGNOSIS — F331 Major depressive disorder, recurrent, moderate: Secondary | ICD-10-CM | POA: Diagnosis not present

## 2019-07-30 DIAGNOSIS — I2510 Atherosclerotic heart disease of native coronary artery without angina pectoris: Secondary | ICD-10-CM | POA: Diagnosis not present

## 2019-07-30 DIAGNOSIS — M25562 Pain in left knee: Secondary | ICD-10-CM | POA: Diagnosis not present

## 2019-07-30 DIAGNOSIS — M8580 Other specified disorders of bone density and structure, unspecified site: Secondary | ICD-10-CM | POA: Diagnosis not present

## 2019-07-30 DIAGNOSIS — Z6841 Body Mass Index (BMI) 40.0 and over, adult: Secondary | ICD-10-CM | POA: Diagnosis not present

## 2019-07-30 MED ORDER — DIAZEPAM 5 MG PO TABS: ORAL_TABLET | ORAL | 1 refills | Status: DC

## 2019-07-30 MED ORDER — SERTRALINE HCL 100 MG PO TABS: 100.0000 mg | ORAL_TABLET | Freq: Every day | ORAL | 0 refills | Status: DC

## 2019-07-30 MED ORDER — HYDROCODONE-ACETAMINOPHEN 5-325 MG PO TABS: 1.0000 | ORAL_TABLET | Freq: Three times a day (TID) | ORAL | 0 refills | Status: DC | PRN

## 2019-07-30 NOTE — Telephone Encounter (Signed)
I left a message asking the patient to call me at [phone] to reschedule AWV with Loma Sousa.  I explained that she will still have the televisit with Dr. Juleen China at 2:40. VDM (Dee-Dee)

## 2019-07-30 NOTE — Progress Notes (Signed)
Virtual Visit via Video   Due to the COVID-19 pandemic, this visit was completed with telemedicine (audio/video) technology to reduce patient and provider exposure as well as to preserve personal protective equipment.   I connected with [name] [name] by a video enabled telemedicine application and verified that I am speaking with the correct person using two identifiers. Location patient: Home Location provider:  [facility], Office Persons participating in the virtual visit: Ramona, [place], DO Lonell Grandchild, CMA acting as scribe for Dr. Briscoe Deutscher.   I discussed the limitations of evaluation and management by telemedicine and the availability of in person appointments. The patient expressed understanding and agreed to proceed.  Care Team   Patient Care Team: Briscoe Deutscher, DO as PCP - General (Family Medicine) Sanda Klein, MD as PCP - Cardiology (Cardiology) Maisie Fus, MD as Consulting Physician (Obstetrics and Gynecology) Richmond Campbell, MD as Consulting Physician (Gastroenterology) Croitoru, Dani Gobble, MD as Consulting Physician (Cardiology) Kathie Rhodes, MD as Consulting Physician (Urology) Danella Sensing, MD as Consulting Physician (Dermatology) Starlyn Skeans, MD as Consulting Physician (Family Medicine) Gaynelle Arabian, MD as Consulting Physician (Orthopedic Surgery) Brand Males, MD as Consulting Physician (Pulmonary Disease)  Subjective:   HPI: Patient has been seen by orthopedics for knee pain. She has also done a complete round of PT. Pain is still severe. Left. Keeps her awake at night. No falls.  Review of Systems  Constitutional: Negative for chills and fever.  HENT: Negative for hearing loss and tinnitus.   Eyes: Negative for blurred vision and double vision.  Respiratory: Negative for cough and wheezing.   Cardiovascular: Positive for leg swelling. Negative for chest pain and palpitations.  Gastrointestinal: Negative for nausea  and vomiting.  Genitourinary: Negative for dysuria and urgency.  Neurological: Negative for headaches.  Psychiatric/Behavioral: Negative for depression and suicidal ideas.    Patient Active Problem List   Diagnosis Date Noted  . Muscular deconditioning 05/30/2019  . 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 ASCVD (arteriosclerotic cardiovascular disease) risk score 18.9% 11/22/2018  . Depression 10/15/2018  . Seasonal allergies, using Flonase and Loratadine prn 10/15/2018  . Atherosclerosis of aorta ([place]) 10/15/2018  . Diastolic dysfunction 77/41/2878  . 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 Vitamin D deficiency 09/17/2017  . Class 3 severe obesity with serious comorbidity and body mass index (BMI) of 40.0 to 44.9 in adult ([place]) 09/17/2017  . Hypertriglyceridemia 09/17/2017  . Multiple lung nodules 05/23/2017  . Pulmonary fibrosis ([place]), followed by Pulmonolgy 05/23/2017  . Abnormal CT scan of lung 01/07/2017  . GERD (gastroesophageal reflux disease) 12/12/2016  . Dyspnea on exertion 12/10/2016  . Anxiety state 07/15/2016  . OSA on CPAP, compliant 05/04/2016  . Pacemaker 10/04/2014  . Long term current use of anticoagulant 06/27/2014  . PAF (paroxysmal atrial fibrillation) ([place]) 06/06/2014  . Tachy-brady syndrome ([place]) 06/06/2014  . TIA (transient ischemic attack) 03/08/2014  . Osteopenia 08/15/2008    Social History   Tobacco Use  . Smoking status: Former Smoker    Packs/day: 2.00    Years: 18.00    Pack years: 36.00    Types: Cigarettes    Quit date: 07/18/1970    Years since quitting: 49.1  . Smokeless tobacco: Never Used  . Tobacco comment: "quit smoking in 1971"  Substance Use Topics  . Alcohol use: Yes    Alcohol/week: 0.0 standard drinks    Comment: 10/04/2014 "might have a drink a couple times/yr"   Current Outpatient Medications:  .  apixaban (ELIQUIS) 5  MG TABS tablet, Take 1 tablet (5 mg total) by mouth 2 (two) times daily., Disp: 180 tablet, Rfl: 1 .  Biotin 5000 MCG CAPS,  Take 5,000 mcg by mouth daily. , Disp: , Rfl:  .  Calcium Carbonate-Vit D-Min (CALCIUM 1200 PO), Take 2,400 mg by mouth daily. , Disp: , Rfl:  .  cholecalciferol (VITAMIN D) 1000 UNITS tablet, Take 1,000 Units by mouth daily. , Disp: , Rfl:  .  Co-Enzyme Q10 100 MG CAPS, Take 100 mg by mouth daily. , Disp: , Rfl:  .  diazepam (VALIUM) 5 MG tablet, Take 1 tablet po daily, Disp: 30 tablet, Rfl: 1 .  fluticasone (FLONASE) 50 MCG/ACT nasal spray, Place 2 sprays into both nostrils daily., Disp: 16 g, Rfl: 1 .  folic acid (FOLVITE) 161 MCG tablet, Take 400 mcg by mouth daily., Disp: , Rfl:  .  furosemide (LASIX) 20 MG tablet, Take 1 tablet (20 mg total) by mouth daily., Disp: 90 tablet, Rfl: 1 .  glucosamine-chondroitin 500-400 MG tablet, Take 1 tablet by mouth daily. , Disp: , Rfl:  .  loratadine (KLS ALLERCLEAR) 10 MG tablet, Take 10 mg by mouth daily., Disp: , Rfl:  .  MAGNESIUM CITRATE PO, Take 250 mg by mouth daily. 1 tab daily, Disp: , Rfl:  .  nitroGLYCERIN (NITROSTAT) 0.4 MG SL tablet, Place 0.4 mg under the tongue every 5 (five) minutes as needed for chest pain., Disp: , Rfl:  .  omeprazole (PRILOSEC) 20 MG capsule, Take 20 mg by mouth daily., Disp: , Rfl:  .  OVER THE COUNTER MEDICATION, CBD oil, Disp: , Rfl:  .  Resveratrol 250 MG CAPS, Take 250 mg by mouth daily., Disp: , Rfl:  .  sertraline (ZOLOFT) 100 MG tablet, Take 1 tablet (100 mg total) by mouth daily., Disp: 90 tablet, Rfl: 0  Allergies  Allergen Reactions  . Ancef [Cefazolin] Hives and Itching  . Pseudoephedrine Hcl     Other reaction(s): palpitations (moderate)  . Ergotamine   . Pentobarbital   . Caffeine Palpitations   Objective:   VITALS: Per patient if applicable, see vitals. GENERAL: Alert, appears well and in no acute distress. HEENT: Atraumatic, conjunctiva clear, no obvious abnormalities on inspection of external nose and ears. NECK: Normal movements of the head and neck. CARDIOPULMONARY: No increased WOB.  Speaking in clear sentences. I:E ratio WNL.  MS: Moves all visible extremities without noticeable abnormality. PSYCH: Pleasant and cooperative, well-groomed. Speech normal rate and rhythm. Affect is appropriate. Insight and judgement are appropriate. Attention is focused, linear, and appropriate.  NEURO: CN grossly intact. Oriented as arrived to appointment on time with no prompting. Moves both UE equally.  SKIN: No obvious lesions, wounds, erythema, or cyanosis noted on face or hands.  Depression screen [facility] 2/9 07/30/2019 04/01/2019 01/04/2019  Decreased Interest 0 0 2  Down, Depressed, Hopeless 0 1 1  PHQ - 2 Score 0 1 3  Altered sleeping 0 0 0  Tired, decreased energy 0 0 3  Change in appetite 0 0 3  Feeling bad or failure about yourself  0 3 2  Trouble concentrating 0 0 0  Moving slowly or fidgety/restless 0 0 0  Suicidal thoughts 0 0 0  PHQ-9 Score 0 4 11  Difficult doing work/chores Not difficult at all Not difficult at all -  Some recent data might be hidden   Assessment and Plan:   [name] [name] was seen today for follow-up.  Diagnoses and all orders for this visit:  Major depressive disorder, recurrent episode, moderate with anxious distress (HCC) -     sertraline (ZOLOFT) 100 MG tablet; Take 1 tablet (100 mg total) by mouth daily.  ASCVD (arteriosclerotic cardiovascular disease) risk score 18.9%  Pulmonary fibrosis ([place]), followed by Pulmonolgy  Osteopenia, unspecified location  Class 3 severe obesity with serious comorbidity and body mass index (BMI) of 40.0 to 44.9 in adult, unspecified obesity type (HCC)  Chronic pain of left knee Comments: Moderate to severe. Working with Programme researcher, broadcasting/film/video. Maximized nonopiod pain control. Okay Rx opiod to be used sparingly. Precuations reviewed. Orders: -     HYDROcodone-acetaminophen (NORCO/VICODIN) 5-325 MG tablet; Take 1 tablet by mouth 3 (three) times daily as needed for moderate pain or severe pain.   Marland Kitchen COVID-19 Education: The signs  and symptoms of COVID-19 were discussed with the patient and how to seek care for testing if needed. The importance of social distancing was discussed today. . Reviewed expectations re: course of current medical issues. . Discussed self-management of symptoms. . Outlined signs and symptoms indicating need for more acute intervention. . Patient verbalized understanding and all questions were answered. Marland Kitchen Health Maintenance issues including appropriate healthy diet, exercise, and smoking avoidance were discussed with patient. . See orders for this visit as documented in the electronic medical record.  Briscoe Deutscher, DO  Records requested if needed. Time spent: 25 minutes, of which >50% was spent in obtaining information about her symptoms, reviewing her previous labs, evaluations, and treatments, counseling her about her condition (please see the discussed topics above), and developing a plan to further investigate it; she had a number of questions which I addressed.

## 2019-08-09 ENCOUNTER — Encounter: Payer: Self-pay | Admitting: Family Medicine

## 2019-08-12 DIAGNOSIS — M25562 Pain in left knee: Secondary | ICD-10-CM | POA: Diagnosis not present

## 2019-08-12 DIAGNOSIS — M1712 Unilateral primary osteoarthritis, left knee: Secondary | ICD-10-CM | POA: Diagnosis not present

## 2019-08-17 ENCOUNTER — Other Ambulatory Visit: Payer: Self-pay

## 2019-08-17 ENCOUNTER — Ambulatory Visit (INDEPENDENT_AMBULATORY_CARE_PROVIDER_SITE_OTHER): Payer: Medicare Other | Admitting: Internal Medicine

## 2019-08-17 ENCOUNTER — Encounter: Payer: Self-pay | Admitting: Internal Medicine

## 2019-08-17 ENCOUNTER — Other Ambulatory Visit: Payer: Self-pay | Admitting: Orthopedic Surgery

## 2019-08-17 VITALS — BP 132/74 | HR 88 | Temp 98.0°F | Ht 66.0 in | Wt 262.8 lb

## 2019-08-17 DIAGNOSIS — R768 Other specified abnormal immunological findings in serum: Secondary | ICD-10-CM

## 2019-08-17 DIAGNOSIS — M2550 Pain in unspecified joint: Secondary | ICD-10-CM | POA: Diagnosis not present

## 2019-08-17 DIAGNOSIS — I2510 Atherosclerotic heart disease of native coronary artery without angina pectoris: Secondary | ICD-10-CM | POA: Diagnosis not present

## 2019-08-17 DIAGNOSIS — J849 Interstitial pulmonary disease, unspecified: Secondary | ICD-10-CM

## 2019-08-17 DIAGNOSIS — M25562 Pain in left knee: Secondary | ICD-10-CM

## 2019-08-17 NOTE — Patient Instructions (Addendum)
ICD-10-CM   1. 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

## 2019-08-17 NOTE — Progress Notes (Signed)
e former smoker followed for dyspnea on exertion, lung nodules, fibrosis, complicated by A. 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 She does not qualify for portable oxygen CT chest 01/06/17-pulmonary fibrosis, small nodules ANA 1:160 PFT 07/19/2019- mild restriction. FVC 2.94/ 93%, FEV1 2.20/ 107%, R .86, FEF25-75% 3.48/ 110%, TLC 74%, DLCO 81%, no resp to BD ------------------------------------------------------------------------------------------  09/21/2018- 78 year old female former smoker followed for dyspnea on exertion, lung nodules, fibrosis, ANA 8:295, complicated by A. 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 Feeling Sob with activity today. Previously completed pulmonary rehab. Body weight 250 lbs No acute changes in her breathing.  Notices a full/bloated feeling especially after meals, but little wheeze or cough.  Still followed by cardiology for A. Fib/pacemaker. Gradually adjusting to loss of daughter who died from end-stage lung disease related to her alpha-1 antitrypsin status. CT chest 04/03/2018- IMPRESSION: 1. Pulmonary nodules are stable to slightly decreased back to 01/06/2017 chest CT, considered benign. 2. 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 No frank honeycombing. No convincing progression since 01/06/2017 high-resolution chest CT study. 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 Usual interstitial pneumonia is unlikely given absence of progression and presence of air trapping. 3. Stable mild patchy air trapping  in both lungs indicative of small airways disease. Aortic Atherosclerosis (ICD10-I70.0).  07/19/2019- 78 year old female former smoker followed for dyspnea on exertion, lung nodules, fibrosis, ANA 6:213, complicated by A. 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 TKR deferred due to Covid. Activity limited to ADLs and not losing weight.  Discussed her CT showing mild stable fibrosis. Also reviewed Echo and PFT. She is anxious, still grieving death of daughter from a1AT resp failure. She presses me to support trial of glutathione because it made her daughter "look better in the face". She wants to see Dr Chase Caller for his expertise re interstitial lung disease, although she is aware of mild chronic stable fibrosis and stable nodules.   PFT 07/19/2019- mild restriction. FVC 2.94/ 93%, FEV1 2.20/ 107%, R .86, FEF25-75% 3.48/ 110%, TLC 74%, DLCO 81%, no resp to BD  ECHO- 05/18/2019-  1. The left ventricle has low normal systolic function, with an ejection fraction of 50-55%. The cavity size was normal. Left ventricular diastolic function could not be evaluated.  2. The right ventricle has normal systolic function. The cavity was normal.  3. The mitral valve is grossly normal.  4. The tricuspid valve is grossly normal.  5. The aortic valve is tricuspid. Mild thickening of the aortic valve. No stenosis of the aortic valve.  6. Normal LV systolic function; no significant valvular heart disease.   OV 07/21/2019  Subjective:  [name], female , DOB: 1941/04/15 , age 78 y.o. , MRN: [id number] , ADDRESS: [place] [address]   07/21/2019 -   Chief Complaint  Patient presents with  . Follow-up    SOB worsening over past 6 months - increased weight gain     HPI [name] [name] 83 y.o. -  is a friend of [name] 1 of  my pulmonary fibrosis patients.  She is also a patient of Dr. Baird Lyons in our clinic as described above.  She is here to the ILD clinic for second opinion about her pulmonary fibrosis.  She tells me that she was diagnosed with pulmonary fibrosis a few years ago and it has been stable.  Is been of insidious onset.  She had limited autoimmune profile as documented below in 2018 when she had a positive ANA but of low titer.  Her dyspnea is been stable.  She is dealing with other medical issues such as obesity, using a walker because of knee issues and a stroke and also death of her daughter from alpha-1 antitrypsin disease approximately 2 years ago.  She says she has some dyspnea on exertion relieved by rest but overall this is stable.  The dyspnea is definitely significant and out of proportion to the level of ILD as documented by her pulmonary function test which shows stability.  Her CT high-resolution that I was personally visualized from a year ago and his CT chest from 2020 showed bilateral bibasal reticular pattern but no honeycombing or traction bronchiectasis.  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  I do not see any features of alternate diagnosis.  She denies any autoimmune features.  She denies having mold in the house of pet birds.  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 Range 02/24/2017 15:55  Anti Nuclear Antibody (ANA) Latest Ref Range: NEGATIVE  POS (A)  ANA Pattern 1 Unknown HOMOGENEOUS (A)  ANA Titer 1 Latest Units: titer 1:160 (H)  RA Latex Turbid. Latest Ref Range: <14 IU/mL <14    OV 08/18/2019  Subjective:  [name], female , DOB: 02-15-1941 , age 46 y.o. , MRN: [id number] , ADDRESS: [place] [address]   08/18/2019 -   Chief Complaint  Patient presents with  . ILD (interstitital lung disease)     HPI [name] [name] 78 y.o. - returns  for followup to discuss her ILD. SHe now has completed the ILD questions.  In the interim there are no new changes.  She says she feels a little bit less hopeful.  This because of her continued obesity and poor functional status needing a cane.   [facility] Integrated Comprehensive ILD Questionnaire  Symptoms:   Her symptom severity is listed below.  She says that the dyspnea started insidious onset.  Gradually.  Progressive.  She has had this for 5 or 6 years.  No episodic dyspnea.  Is also chronic cough that is mild for the last 1 or 2 years it is the same since it started.  She brings occasional sputum but usually clear.  Does not cough at night.  She does clear her throat.  She wheezes only when she has a respiratory exacerbation.   Past Medical History : Positive for chronic bronchitis acute bronchitis when she has a cold.  She reports a history of diastolic heart failure as told to her by her primary care physician but denies any rheumatoid arthritis or collagen vascular disease or asthma.  She does have acid reflux disease and sleep apnea for the last several years.  No HIV or pulmonary hypertension or diabetes.  She did have a TIA 4 to 5 years ago.  No seizures no  mononucleosis no hepatitis no tuberculosis no kidney disease.  No pneumonia.  No pleurisy   ROS: Positive for fatigue for the last few years and arthralgia for the last several decades.  She is interested in seeing a rheumatologist.  She does have dysphagia and reports a history of stretching.  No dry eyes.  No discoloration of the fingers no recurrent fever no weight loss no nausea vomiting.  No snoring.  She does have acid reflux for the last several years.   FAMILY HISTORY of LUNG DISEASE: Negative for pulmonary fibrosis but mother and son have COPD.  Daughter had asthma.   EXPOSURE HISTORY: She used to smoke cigarettes 20 1959 and 1969 2 packs a day.  She is lived in the same house to stop smoking cigars.  No electronic cigarettes  no vaping no marijuana use no cocaine use no intravenous drug use   HOME and HOBBY DETAILS : She lives in a single-family home of 78 years of age on 01/21/2019 and then moved to a retirement community .  She reports using a humidifier that in the past has had tiny amount of mildew in it but otherwise no mold exposure in the house.  No feather pillows.   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  When she is staying in motels with damp but these have been transient.  When she grew up in the farm she was exposed to chicken.  He has done some personal spray painting work but nothing out of the ordinary.  Essentially 122+ history is negative.   PULMONARY TOXICITY HISTORY (27 items): She used Macrobid for a long time in the 1980s and 1990s because of frequent bladder infection.  Intermittent prednisone use in the past.      SYMPTOM SCALE - ILD 07/21/2019  08/18/2019   O2 use ra ra  Shortness of Breath 0 -> 5 scale with 5 being worst (score 6 If unable to do)   At rest 0 0  Simple tasks - showers, clothes change, eating, shaving 4 2  Household (dishes, doing bed, laundry) 4 3  Shopping 5 2  Walking level at own pace 5 3  Walking keeping up with others of same age 42 2  Walking up Stairs 5 4  Walking up Hill 5 4  Total (40 - 48) Dyspnea Score 33 20  How bad is your cough? 1 1  How bad is your fatigue 4 3     Simple office walk 185 feet x  3 laps goal with forehead probe 07/21/2019   O2 used ra  Number laps completed 3 laps  Comments about pace modertae with walker  Resting Pulse Ox/HR 97% and 78/min  Final Pulse Ox/HR 93% and 109/min  Desaturated </= 88% no  Desaturated <= 3% points Yes, 4  Got Tachycardic >/= 90/min yes  Symptoms at end of test dyspnea  Miscellaneous comments x        Results for LIANY, MUMPOWER (MRN [id number]) as of 07/21/2019 15:57  Ref. Range 02/24/2017 11:58 07/19/2019  15:20  FVC-Pre Latest Units: L 2.58 2.74  FVC-%Pred-Pre Latest Units: % 85 93  Results for KHADEEJAH, CASTNER (MRN [id number]) as of 07/21/2019 15:57  Ref. Range 02/24/2017 11:58 07/19/2019 15:20  DLCO unc Latest Units: ml/min/mmHg 16.15 16.52  DLCO unc % pred Latest Units: % 59 81     CT chest 04/05/2019-  IMPRESSION: No acute cardiopulmonary disease. Mild chronic stable fibrotic change. Several small subcentimeter mostly peripheral pulmonary nodules with the largest measuring 7 mm over the posterolateral right lower lobe. These are unchanged for greater than 2 years and therefore considered benign. Aortic Atherosclerosis (ICD10-I70.0).   Results for RICARDA, ATAYDE (MRN [id number]) as of 08/17/2019 11:51  Ref. Range 02/24/2017 15:55 07/21/2019 16:39 07/21/2019 16:39  ASPERGILLUS FUMIGATUS Latest Ref Range: NEGATIVE    NEGATIVE  [place] Serum Latest Ref Range: NEGATIVE    NEGATIVE  SEE BELOW Unknown  Comment   Anti Nuclear Antibody (ANA) Latest Ref Range: NEGATIVE  POS (A)  POSITIVE (A)  ANA Pattern 1 Unknown HOMOGENEOUS (A)  Nuclear, Dense Fine Speckled (A)  ANA Titer 1 Latest Units: titer 1:160 (H) Negative 1:160 (H)  ANCA Proteinase 3 Latest Ref Range: 0.0 - 3.5 U/mL  <3.5   Angiotensin-Converting Enzyme Latest Ref Range: 9 - 67 U/L   48  Anti JO-1 Latest Ref Range: 0.0 - 0.9 AI  <0.2   CENTROMERE AB SCREEN Latest Ref Range: 0.0 - 0.9 AI  <4.9   Cyclic Citrullin Peptide Ab Latest Units: UNITS   <16  dsDNA Ab Latest Ref Range: 0 - 9 IU/mL  <1   ENA RNP Ab Latest Ref Range: 0.0 - 0.9 AI  <0.2   ENA SSA (RO) Ab Latest Ref Range: 0.0 - 0.9 AI  <0.2   ENA SSB (LA) Ab Latest Ref Range: 0.0 - 0.9 AI  0.3   Myeloperoxidase Abs Latest Units: AI   <1.0  Serine Protease 3 Latest Units: AI   <1.0  RA Latex Turbid. Latest Ref Range: <14 IU/mL <14  <14  ENA SM Ab Ser-aCnc Latest Ref Range: 0.0 - 0.9 AI  <0.2   Chromatin Ab SerPl-aCnc Latest Ref Range: 0.0 - 0.9 AI  <0.2   SSA (Ro) (ENA) Antibody, IgG  Latest Ref Range: <1.0 NEG AI   <1.0 NEG  SSB (La) (ENA) Antibody, IgG Latest Ref Range: <1.0 NEG AI   <1.0 NEG  Scleroderma (Scl-70) (ENA) Antibody, IgG Latest Ref Range: <1.0 NEG AI  <0.2 <1.0 NEG    ROS - per HPI     has a past medical history of Alpha-1-antitrypsin deficiency carrier, Arthritis, Atrial fibrillation (HCC), Back pain, Complication of anesthesia, Frequent UTI, GERD (gastroesophageal reflux disease), Hypertension, MVP (mitral valve prolapse), Obesity, Osteoarthritis, Presence of permanent cardiac pacemaker, PVC's (premature ventricular contractions), Shortness of breath, Sinus arrest (10/2014), Sleep apnea, Stroke ([facility]) (01/2014), 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   reports that she quit smoking about 49 years ago. Her smoking use included cigarettes. She has a 36.00 pack-year smoking history. She has never used smokeless tobacco.  Past Surgical History:  Procedure Laterality Date  . CARDIOVERSION N/A 09/02/2017   Procedure: CARDIOVERSION;  Surgeon: Sanda Klein, MD;  Location: [facility];  Service: Cardiovascular;  Laterality: N/A;  . EXCISION VAGINAL CYST     benign nodules  . INSERT / REPLACE / REMOVE PACEMAKER  10/04/2014   Medtronic Advisa model [id number] serial number [id number]  . LOOP RECORDER EXPLANT N/A 10/04/2014   Procedure: LOOP RECORDER EXPLANT;  Surgeon: Sanda Klein, MD;  Location: [facility];  Service: Cardiovascular;  Laterality: N/A;  . LOOP RECORDER IMPLANT N/A 04/19/2014   Procedure: LOOP RECORDER IMPLANT;  Surgeon: Sanda Klein, MD;  Location: [facility] CATH LAB;  Service: Cardiovascular;  Laterality: N/A;  . NM MYOCAR PERF WALL MOTION  12/18/2007   normal  .  PERMANENT PACEMAKER INSERTION N/A 10/04/2014   Procedure: PERMANENT PACEMAKER INSERTION;  Surgeon: Sanda Klein, MD;  Location: [facility] CATH LAB;  Service: Cardiovascular;  Laterality: N/A;  . TUBAL LIGATION    . US ECHOCARDIOGRAPHY  05/15/2010   LA mildly  dilated,mild mitral annular ca+, AOV mildly sclerotic, mild asymmetric LVH    Allergies  Allergen Reactions  . Ancef [Cefazolin] Hives and Itching  . Pseudoephedrine Hcl     Other reaction(s): palpitations (moderate)  . Ergotamine   . Pentobarbital   . Caffeine Palpitations    Immunization History  Administered Date(s) Administered  . Influenza Whole 09/23/2008  . Influenza, High Dose Seasonal PF 09/13/2018  . Influenza,inj,quad, With Preservative 09/04/2017  . Influenza-Unspecified 09/17/2014, 09/09/2016, 09/04/2017  . Pneumococcal Conjugate-13 10/16/2015  . Pneumococcal Polysaccharide-23 10/02/2017  . Pneumococcal-Unspecified 10/01/2014, 10/02/2017  . Zoster Recombinat (Shingrix) 09/29/2018, 01/19/2019    Family History  Problem Relation Age of Onset  . Heart attack Mother   . Sudden death Mother   . Depression Mother   . Stroke Father   . Heart failure Father   . Depression Father   . Heart failure Brother   . Stroke Brother   . Alpha-1 antitrypsin deficiency Daughter      Current Outpatient Medications:  .  apixaban (ELIQUIS) 5 MG TABS tablet, Take 1 tablet (5 mg total) by mouth 2 (two) times daily., Disp: 180 tablet, Rfl: 1 .  Biotin 5000 MCG CAPS, Take 5,000 mcg by mouth daily. , Disp: , Rfl:  .  Calcium Carbonate-Vit D-Min (CALCIUM 1200 PO), Take 2,400 mg by mouth daily. , Disp: , Rfl:  .  cholecalciferol (VITAMIN D) 1000 UNITS tablet, Take 1,000 Units by mouth daily. , Disp: , Rfl:  .  Co-Enzyme Q10 100 MG CAPS, Take 100 mg by mouth daily. , Disp: , Rfl:  .  diazepam (VALIUM) 5 MG tablet, Take 1 tablet po daily, Disp: 30 tablet, Rfl: 1 .  fluticasone (FLONASE) 50 MCG/ACT nasal spray, Place 2 sprays into both nostrils daily., Disp: 16 g, Rfl: 1 .  folic acid (FOLVITE) 638 MCG tablet, Take 400 mcg by mouth daily., Disp: , Rfl:  .  furosemide (LASIX) 20 MG tablet, Take 1 tablet (20 mg total) by mouth daily., Disp: 90 tablet, Rfl: 1 .  glucosamine-chondroitin  500-400 MG tablet, Take 1 tablet by mouth daily. , Disp: , Rfl:  .  HYDROcodone-acetaminophen (NORCO/VICODIN) 5-325 MG tablet, Take 1 tablet by mouth 3 (three) times daily as needed for moderate pain or severe pain., Disp: 30 tablet, Rfl: 0 .  loratadine (KLS ALLERCLEAR) 10 MG tablet, Take 10 mg by mouth daily., Disp: , Rfl:  .  MAGNESIUM CITRATE PO, Take 250 mg by mouth daily. 1 tab daily, Disp: , Rfl:  .  nitroGLYCERIN (NITROSTAT) 0.4 MG SL tablet, Place 0.4 mg under the tongue every 5 (five) minutes as needed for chest pain., Disp: , Rfl:  .  omeprazole (PRILOSEC) 20 MG capsule, Take 20 mg by mouth daily., Disp: , Rfl:  .  OVER THE COUNTER MEDICATION, CBD oil, Disp: , Rfl:  .  Resveratrol 250 MG CAPS, Take 250 mg by mouth daily., Disp: , Rfl:  .  sertraline (ZOLOFT) 100 MG tablet, Take 1 tablet (100 mg total) by mouth daily., Disp: 90 tablet, Rfl: 0      Objective:   Vitals:   08/17/19 1135  BP: 132/74  Pulse: 88  Temp: 98 F (36.7 C)  SpO2: 94%  Weight: 262 lb  12.8 oz (119.2 kg)  Height: 5' 6" (1.676 m)    Estimated body mass index is 42.42 kg/m as calculated from the following:   Height as of this encounter: 5' 6" (1.676 m).   Weight as of this encounter: 262 lb 12.8 oz (119.2 kg).  _0 @  Filed Weights   08/17/19 1135  Weight: 262 lb 12.8 oz (119.2 kg)     Physical Exam   Limited exam only shows obese female sitting comfortably.  Has a cane.  Lung basal exam distant air sounds possible crackles at the lung base.      Assessment:       ICD-10-CM   1. ILD (interstitial lung disease) ([facility])  J84.9 Pulmonary function test    CT Chest High Resolution  2. ANA positive  R76.8   3. Arthralgia, unspecified joint  M25.50   4. Interstitial pulmonary disease (HCC)  J84.9 Pulmonary function test    CT Chest High Resolution   #ILD -she has actually has mild interstitial lung disease that has been stable.  The pattern on the CT scan is not predictive of UIP.  The  only risk factors for ILD that I could ascertain are the acid reflux disease predominantly and then chronic Macrobid use several decades ago.  Her autoimmune profile is essentially negative except for trace positive ANA that I think is because of her age and is not clinically significant.  Therefore I do not think she has autoimmune ILD. I do not think the mildew exposure was significant.  Technically she meets indications for surgical lung biopsy but given the stability in her comorbidities I do not think biopsy is worthwhile.  I would consider this is a non--UIP pattern of ILD that is mild and stable.  In this particular phenotype anti-fibrotic's are not indicated when stable but are indicated if she is progressive.  Moreover anti-fibrotic skin side effects from her therefore an expectant approach is the best in this situation  #ANA positivity mild with arthralgia -I think she has osteoarthritis and the ANA positivity is not correlated.  Nevertheless she is interested in a rheumatologic opinion and we will make this referral.  I have explained all this to her and she is in agreement.      Plan:     Patient Instructions     ICD-10-CM   1. 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  This includes one or more of the following documented above: discussion of test results, diagnostic or treatment recommendations, prognosis, risks and benefits of management options, instructions, education, compliance or risk-factor reduction    SIGNATURE    Dr. Brand Males, M.D., F.C.C.P,  Pulmonary and Critical Care Medicine Staff Physician, [place] Director - Interstitial Lung Disease  Program  Pulmonary [place] at [place], [place], [address]  Pager: [phone], If no answer or between  15:00h - 7:00h: call [phone] Telephone: [phone]  9:42 AM 08/18/2019

## 2019-08-18 ENCOUNTER — Encounter: Payer: Self-pay | Admitting: Internal Medicine

## 2019-08-23 ENCOUNTER — Telehealth: Payer: Self-pay | Admitting: Cardiovascular Disease

## 2019-08-23 NOTE — Telephone Encounter (Signed)
Spoke with pt who states she is calling begging for samples of Eliquis. Informed pt that nurse was able to get 2 boxes of samples of Eliquis 5 mg for pt 2-week supply. She states it may take 1-2 days for her to pick up samples. Pt confirmed that she is on medicare. Advised pt to complete PA form included with samples. She states that she believes she has completed the form before and was not approved but finances and current medicare plan may mean a different decision. Advised her to ask her pharmacy for printout of her OOP cost for meds. She states she will complete form and return to office.  Medication Samples have been left at front desk for patient along with patient assistance form.  Drug name: ELIQUIS       Strength: 5 MG        Qty: 2 BOXES  LOT: [id number]  Exp.Date: 11/2021   Kathy Breach Delecia Vastine 1:05 PM 08/23/2019

## 2019-08-23 NOTE — Telephone Encounter (Signed)
Patient calling the office for samples of medication:   1.  What medication and dosage are you requesting samples for?   Eliiquis  2.  Are you currently out of this medication?  yes

## 2019-08-24 ENCOUNTER — Telehealth: Payer: Self-pay | Admitting: Internal Medicine

## 2019-08-24 ENCOUNTER — Ambulatory Visit: Payer: Self-pay | Admitting: Internal Medicine

## 2019-08-24 DIAGNOSIS — R768 Other specified abnormal immunological findings in serum: Secondary | ICD-10-CM

## 2019-08-24 NOTE — Telephone Encounter (Signed)
ATC pt, no answer. Left message for pt to call back.  

## 2019-08-24 NOTE — Telephone Encounter (Signed)
Pt returning call a/b referral and can be reached @ [phone].[name] [name]

## 2019-08-25 NOTE — Telephone Encounter (Signed)
Spoke with pt. She is inquiring about her Rheumatology referral. This order was not placed. Order has now been placed. Advised her that we would work on this for her and get back with her. Nothing further is needed at this time.

## 2019-09-06 ENCOUNTER — Telehealth: Payer: Self-pay | Admitting: Cardiovascular Disease

## 2019-09-06 NOTE — Telephone Encounter (Signed)
Pt notified 

## 2019-09-06 NOTE — Telephone Encounter (Signed)
Patient calling the office for samples of medication:   1.  What medication and dosage are you requesting samples for? apixaban (ELIQUIS) 5 MG TABS tablet   2.  Are you currently out of this medication? Just two pills left.

## 2019-09-09 NOTE — Progress Notes (Signed)
Office Visit Note  Patient: [name] [name]             Date of Birth: [birth year]           MRN: [id number]             PCP: Briscoe Deutscher, DO Referring: Brand Males, MD Visit Date: 09/22/2019 Occupation: retired, Engineer, site  Subjective:  Pain in multiple joints.   History of Present Illness: [name] [name] is a 78 y.o. female seen in consultation per request of Dr. Chase Caller.  According to patient she has had multiple medical problems including her heart disease lung disease.  She was under care of Dr. Annamaria Boots for many years.  She had recent CT scan of her chest which showed pulmonary fibrosis.  She was evaluated by Dr. Lissa Merlin for me and had some lab work done.  Her labs revealed positive ANA.  For that reason she was referred to me.  She states she has pain and discomfort in her joints due to osteoarthritis for many years.  She continues to have some stiffness in her hands and feet.  She states in June she fell and injured her left knee joint.  She had a CT scan of her knee joint.  She also had cortisone injections x2.  She is also going to physical therapy.  She denies any joint swelling.  Activities of Daily Living:  Patient reports morning stiffness for 30 minutes.   Patient Reports nocturnal pain.  Difficulty dressing/grooming: Denies Difficulty climbing stairs: Reports Difficulty getting out of chair: Reports Difficulty using hands for taps, buttons, cutlery, and/or writing: Denies  Review of Systems  Constitutional: Positive for fatigue. Negative for night sweats, weight gain and weight loss.  HENT: Positive for mouth dryness. Negative for mouth sores, trouble swallowing, trouble swallowing and nose dryness.   Eyes: Negative for pain, redness, visual disturbance and dryness.  Respiratory: Positive for shortness of breath. Negative for cough and difficulty breathing.   Cardiovascular: Negative for chest pain, palpitations, hypertension, irregular heartbeat and swelling  in legs/feet.  Gastrointestinal: Negative for blood in stool, constipation and diarrhea.  Endocrine: Negative for increased urination.  Genitourinary: Negative for vaginal dryness.  Musculoskeletal: Positive for arthralgias, joint pain and morning stiffness. Negative for joint swelling, myalgias, muscle weakness, muscle tenderness and myalgias.  Skin: Negative for color change, rash, hair loss, skin tightness, ulcers and sensitivity to sunlight.  Allergic/Immunologic: Negative for susceptible to infections.  Neurological: Negative for dizziness, memory loss, night sweats and weakness.  Hematological: Negative for swollen glands.  Psychiatric/Behavioral: Positive for depressed mood. Negative for sleep disturbance. The patient is nervous/anxious.     PMFS History:  Patient Active Problem List   Diagnosis Date Noted  . Muscular deconditioning 05/30/2019  . 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 ASCVD (arteriosclerotic cardiovascular disease) risk score 18.9% 11/22/2018  . Depression 10/15/2018  . Seasonal allergies, using Flonase and Loratadine prn 10/15/2018  . Atherosclerosis of aorta ([place]) 10/15/2018  . Diastolic dysfunction 37/79/3968  . 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 Vitamin D deficiency 09/17/2017  . Class 3 severe obesity with serious comorbidity and body mass index (BMI) of 40.0 to 44.9 in adult ([place]) 09/17/2017  . Hypertriglyceridemia 09/17/2017  . Multiple lung nodules 05/23/2017  . Pulmonary fibrosis ([place]), followed by Pulmonolgy 05/23/2017  . Abnormal CT scan of lung 01/07/2017  . GERD (gastroesophageal reflux disease) 12/12/2016  . Dyspnea on exertion 12/10/2016  . Anxiety state 07/15/2016  . OSA on CPAP, compliant 05/04/2016  .  Pacemaker 10/04/2014  . Long term current use of anticoagulant 06/27/2014  . PAF (paroxysmal atrial fibrillation) ([place]) 06/06/2014  . Tachy-brady syndrome ([place]) 06/06/2014  . TIA (transient ischemic attack) 03/08/2014  . Osteopenia 08/15/2008     Past Medical History:  Diagnosis Date  . Alpha-1-antitrypsin deficiency carrier   . Arthritis    "all over"  . Atrial fibrillation ([place])   . Back pain   . Complication of anesthesia    "I woke up during 2 different procedures" (10/04/2014)  . Frequent UTI   . GERD (gastroesophageal reflux disease)   . Hypertension   . MVP (mitral valve prolapse)   . Obesity   . Osteoarthritis   . Presence of permanent cardiac pacemaker   . PVC's (premature ventricular contractions)   . Shortness of breath   . 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 Sleep apnea   . Stroke [facility]) 01/2014   denies deficits on 10/04/2014  . Tachy-brady syndrome ([place]) 10/2014  . TIA (transient ischemic attack)     Family History  Problem Relation Age of Onset  . Heart attack Mother   . Sudden death Mother   . Stroke Father   . Heart failure Father   . Alcoholism Brother   . Healthy Sister   . Stroke Brother   . Alpha-1 antitrypsin deficiency Daughter   . Breast cancer Daughter   . Prostate cancer Brother   . Stroke Brother   . Prostate cancer Brother   . Healthy Brother   . Prostate cancer Brother   . Healthy Daughter   . Diabetes Son   . Arthritis Son    Past Surgical History:  Procedure Laterality Date  . CARDIOVERSION N/A 09/02/2017   Procedure: CARDIOVERSION;  Surgeon: Sanda Klein, MD;  Location: [facility] ENDOSCOPY;  Service: Cardiovascular;  Laterality: N/A;  . ESOPHAGEAL DILATION    . EXCISION VAGINAL CYST     benign nodules  . INSERT / REPLACE / REMOVE PACEMAKER  10/04/2014   Medtronic Advisa model [id number] serial number [id number]  . LOOP RECORDER EXPLANT N/A 10/04/2014   Procedure: LOOP RECORDER EXPLANT;  Surgeon: Sanda Klein, MD;  Location: [place] CATH LAB;  Service: Cardiovascular;  Laterality: N/A;  . LOOP RECORDER IMPLANT N/A 04/19/2014   Procedure: LOOP RECORDER IMPLANT;  Surgeon: Sanda Klein, MD;  Location: [place] CATH LAB;  Service: Cardiovascular;   Laterality: N/A;  . NM MYOCAR PERF WALL MOTION  12/18/2007   normal  . PERMANENT PACEMAKER INSERTION N/A 10/04/2014   Procedure: PERMANENT PACEMAKER INSERTION;  Surgeon: Sanda Klein, MD;  Location: [place] CATH LAB;  Service: Cardiovascular;  Laterality: N/A;  . TUBAL LIGATION    . US ECHOCARDIOGRAPHY  05/15/2010   LA mildly dilated,mild mitral annular ca+, AOV mildly sclerotic, mild asymmetric LVH   Social History   Social History Narrative   Patient lives alone in one story house.   Patient is right handed.   Patient has a college degree.   Patient is retired.   Immunization History  Administered Date(s) Administered  . Influenza Whole 09/23/2008  . Influenza, High Dose Seasonal PF 09/13/2018  . Influenza,inj,quad, With Preservative 09/04/2017  . Influenza-Unspecified 09/17/2014, 09/09/2016, 09/04/2017  . Pneumococcal Conjugate-13 10/16/2015  . Pneumococcal Polysaccharide-23 10/02/2017  . Pneumococcal-Unspecified 10/01/2014, 10/02/2017  . Zoster Recombinat (Shingrix) 09/29/2018, 01/19/2019     Objective: Vital Signs: BP 117/75 (BP Location: Right Wrist, Patient Position: Sitting, Cuff Size: Normal)   Pulse 86   Resp  16   Ht 5' 6.5" (1.689 m)   Wt 264 lb (119.7 kg)   LMP  (LMP Unknown)   BMI 41.97 kg/m    Physical Exam Vitals signs and nursing note reviewed.  Constitutional:      Appearance: She is well-developed.  HENT:     Head: Normocephalic and atraumatic.  Eyes:     Conjunctiva/sclera: Conjunctivae normal.  Neck:     Musculoskeletal: Normal range of motion.  Cardiovascular:     Rate and Rhythm: Normal rate and regular rhythm.     Heart sounds: Normal heart sounds.  Pulmonary:     Effort: Pulmonary effort is normal.     Breath sounds: Normal breath sounds.  Abdominal:     General: Bowel sounds are normal.     Palpations: Abdomen is soft.  Lymphadenopathy:     Cervical: No cervical adenopathy.  Skin:    General: Skin is warm and dry.     Capillary Refill:  Capillary refill takes less than 2 seconds.  Neurological:     Mental Status: She is alert and oriented to person, place, and time.  Psychiatric:        Behavior: Behavior normal.      Musculoskeletal Exam: C-spine was in good range of motion.  Shoulder joints elbow joints wrist joints MCP joints with good range of motion.  She had mild DIP thickening consistent with osteoarthritis.  She has some discomfort range of motion of her left knee joint but no warmth swelling or effusion was noted.  CDAI Exam: CDAI Score: - Patient Global: -; Provider Global: - Swollen: -; Tender: - Joint Exam   No joint exam has been documented for this visit   There is currently no information documented on the homunculus. Go to the Rheumatology activity and complete the homunculus joint exam.  Investigation: Findings:  07/21/19: ANA 1:160 nuclear, dense fine speckled, ENA-, CK 98, TSH 2.79, BNP 81, sed rate 15, ACE 48, RF<14, CCP<16, ANCA-  Component     Latest Ref Rng & Units 07/21/2019 07/21/2019         2:44 PM  4:39 PM  ANA Titer 1       Negative  dsDNA Ab     0 - 9 IU/mL  <1  ENA RNP Ab     0.0 - 0.9 AI  <0.2  ENA SM Ab Ser-aCnc     0.0 - 0.9 AI  <0.2  Scleroderma (Scl-70) (ENA) Antibody, IgG     0.0 - 0.9 AI  <0.2  ENA SSA (RO) Ab     0.0 - 0.9 AI  <0.2  ENA SSB (LA) Ab     0.0 - 0.9 AI  0.3  Chromatin Ab SerPl-aCnc     0.0 - 0.9 AI  <0.2  Anti JO-1     0.0 - 0.9 AI  <0.2  CENTROMERE AB SCREEN     0.0 - 0.9 AI  <0.2  SEE BELOW       Comment  Aspergillus Fumigatus     NEGATIVE  NEGATIVE  Faenia retivirgula     NEGATIVE  NEGATIVE  P[place]on Serum     NEGATIVE  NEGATIVE  T. CANDIDUS     NEGATIVE  NEGATIVE  T. VULGARIS     NEGATIVE  NEGATIVE  S. VIRIDIS     NEGATIVE  NEGATIVE  CK Total     29 - 143 U/L  98  CK, MB     0 - 5.0 ng/mL  1.7  Relative Index     0 - 4.0  1.7  Myeloperoxidase Abs     AI  <1.0  Serine Protease 3     AI  <1.0  ANA Pattern 1         TSH      0.450 - 4.500 uIU/mL 2.790   Pro B Natriuretic peptide (BNP)     0.0 - 100.0 pg/mL  81.0  Sed Rate     0 - 30 mm/hr  15  Angiotensin-Converting Enzyme     9 - 67 U/L  48  Anti Nuclear Antibody (ANA)     NEGATIVE  POSITIVE (A)  RA Latex Turbid.     <00 IU/mL  <71  Cyclic Citrullin Peptide Ab     UNITS  <16  ANCA SCREEN     NEGATIVE  NEGATIVE  ANCA Proteinase 3     0.0 - 3.5 U/mL  <3.5  Aldolase       CANCELED  SSA (Ro) (ENA) Antibody, IgG     <1.0 NEG AI  <1.0 NEG  SSB (La) (ENA) Antibody, IgG     <1.0 NEG AI  <1.0 NEG   Component     Latest Ref Rng & Units 07/21/2019         4:39 PM  ANA Titer 1      1:160 (H)  dsDNA Ab     0 - 9 IU/mL   ENA RNP Ab     0.0 - 0.9 AI   ENA SM Ab Ser-aCnc     0.0 - 0.9 AI   Scleroderma (Scl-70) (ENA) Antibody, IgG     0.0 - 0.9 AI <1.0 NEG  ENA SSA (RO) Ab     0.0 - 0.9 AI   ENA SSB (LA) Ab     0.0 - 0.9 AI   Chromatin Ab SerPl-aCnc     0.0 - 0.9 AI   Anti JO-1     0.0 - 0.9 AI   CENTROMERE AB SCREEN     0.0 - 0.9 AI   SEE BELOW        Aspergillus Fumigatus     NEGATIVE   Faenia retivirgula     NEGATIVE   [place]on Serum     NEGATIVE   T. CANDIDUS     NEGATIVE   T. VULGARIS     NEGATIVE   S. VIRIDIS     NEGATIVE   CK Total     29 - 143 U/L   CK, MB     0 - 5.0 ng/mL   Relative Index     0 - 4.0   Myeloperoxidase Abs     AI   Serine Protease 3     AI   ANA Pattern 1      Nuclear, Dense Fine Speckled (A)  TSH     0.450 - 4.500 uIU/mL   Pro B Natriuretic peptide (BNP)     0.0 - 100.0 pg/mL   Sed Rate     0 - 30 mm/hr   Angiotensin-Converting Enzyme     9 - 67 U/L   Anti Nuclear Antibody (ANA)     NEGATIVE   RA Latex Turbid.     <21 IU/mL   Cyclic Citrullin Peptide Ab     UNITS   ANCA SCREEN     NEGATIVE   ANCA Proteinase 3     0.0 - 3.5 U/mL   Aldolase  SSA (Ro) (ENA) Antibody, IgG     <1.0 NEG AI   SSB (La) (ENA) Antibody, IgG     <1.0 NEG AI    Imaging: No results found.  Recent Labs:  Lab Results  Component Value Date   WBC 9.3 07/21/2019   HGB 14.1 07/21/2019   PLT 275 07/21/2019   NA 138 07/21/2019   K 4.4 07/21/2019   CL 99 07/21/2019   CO2 23 07/21/2019   GLUCOSE 101 (H) 07/21/2019   BUN 26 07/21/2019   CREATININE 0.98 07/21/2019   BILITOT 0.3 07/21/2019   ALKPHOS 80 07/21/2019   AST 22 07/21/2019   ALT 14 07/21/2019   PROT 6.5 07/21/2019   ALBUMIN 4.1 07/21/2019   CALCIUM 9.4 07/21/2019   GFRAA 64 07/21/2019    Speciality Comments: No specialty comments available.  Procedures:  No procedures performed Allergies: Ancef [cefazolin], Pseudoephedrine hcl, Ergotamine, Pentobarbital, and Caffeine   Assessment / Plan:     Visit Diagnoses: Positive ANA (antinuclear antibody) - 07/21/19: ANA 1:160 nuclear, dense fine speckled, ENA-, CK 98, TSH 2.79, BNP 81, sed rate 15, ACE 48, RF<14, CCP<16, ANCA-.  Patient has no obvious clinical features of lupus.  I will obtainAVISE labs today.  Pulmonary fibrosis ([place]), followed by Pulmonolgy-she was followed by Dr. Annamaria Boots in the past and recently started seeing Dr. Wilder Glade.  Primary osteoarthritis of both hands-she complains of joint discomfort in her hands.  Clinical findings are consistent with osteoarthritis.  She has DIP thickening on examination with no synovitis.  Pain in left knee-patient had recent fall and had been having discomfort in her left knee.  She states she had CT scan and was told that she is osteoarthritis.  She has had cortisone injections x2.  Muscular deconditioning-she feels tired easily.  Osteopenia, unspecified location-she has been taking calcium and vitamin D.  Other medical problems are listed as follows: Presbyesophagus  Gastroesophageal reflux disease without esophagitis  OSA on CPAP, compliant  TIA (transient ischemic attack)  Tachy-brady syndrome (HCC)  PAF (paroxysmal atrial fibrillation) (HCC)  Atherosclerosis of aorta (H[facility])  Diastolic dysfunction  Pacemaker  Long term  current use of anticoagulant  Insulin resistance  Hypertriglyceridemia  Multiple lung nodules  Vitamin D deficiency  Orders: No orders of the defined types were placed in this encounter.  No orders of the defined types were placed in this encounter.   Face-to-face time spent with patient was 45 minutes. Greater than 50% of time was spent in counseling and coordination of care.  Follow-Up Instructions: Return for Pulmonary fibrosis, +ANA.   Bo Merino, MD  Note - This record has been created using Editor, commissioning.  Chart creation errors have been sought, but may not always  have been located. Such creation errors do not reflect on  the standard of medical care.

## 2019-09-20 ENCOUNTER — Telehealth: Payer: Self-pay | Admitting: Family Medicine

## 2019-09-20 NOTE — Telephone Encounter (Signed)
Left message to return call to our office.  

## 2019-09-20 NOTE — Telephone Encounter (Signed)
I called the patient to reschedule her AWV with [name] [name].  She asked me to let [name] know that she needs to speak with her please. VDM (Dee-Dee)

## 2019-09-21 ENCOUNTER — Other Ambulatory Visit: Payer: Self-pay

## 2019-09-21 NOTE — Telephone Encounter (Signed)
Patient wanted to know if you had any idea of who you wold recommend for PCP. She does not want to see andy has seen in the past. She is on Valium as needed.

## 2019-09-22 ENCOUNTER — Ambulatory Visit (INDEPENDENT_AMBULATORY_CARE_PROVIDER_SITE_OTHER): Payer: Medicare Other | Admitting: Rheumatology

## 2019-09-22 ENCOUNTER — Other Ambulatory Visit: Payer: Self-pay

## 2019-09-22 ENCOUNTER — Encounter: Payer: Self-pay | Admitting: Rheumatology

## 2019-09-22 VITALS — BP 117/75 | HR 86 | Resp 16 | Ht 66.5 in | Wt 264.0 lb

## 2019-09-22 DIAGNOSIS — J8410 Pulmonary fibrosis, unspecified: Secondary | ICD-10-CM

## 2019-09-22 DIAGNOSIS — Z7901 Long term (current) use of anticoagulants: Secondary | ICD-10-CM

## 2019-09-22 DIAGNOSIS — R768 Other specified abnormal immunological findings in serum: Secondary | ICD-10-CM

## 2019-09-22 DIAGNOSIS — K2289 Other specified disease of esophagus: Secondary | ICD-10-CM

## 2019-09-22 DIAGNOSIS — M8580 Other specified disorders of bone density and structure, unspecified site: Secondary | ICD-10-CM

## 2019-09-22 DIAGNOSIS — M19041 Primary osteoarthritis, right hand: Secondary | ICD-10-CM

## 2019-09-22 DIAGNOSIS — G459 Transient cerebral ischemic attack, unspecified: Secondary | ICD-10-CM

## 2019-09-22 DIAGNOSIS — K228 Other specified diseases of esophagus: Secondary | ICD-10-CM

## 2019-09-22 DIAGNOSIS — M19042 Primary osteoarthritis, left hand: Secondary | ICD-10-CM

## 2019-09-22 DIAGNOSIS — I2510 Atherosclerotic heart disease of native coronary artery without angina pectoris: Secondary | ICD-10-CM

## 2019-09-22 DIAGNOSIS — K219 Gastro-esophageal reflux disease without esophagitis: Secondary | ICD-10-CM

## 2019-09-22 DIAGNOSIS — I480 Paroxysmal atrial fibrillation: Secondary | ICD-10-CM | POA: Diagnosis not present

## 2019-09-22 DIAGNOSIS — I5189 Other ill-defined heart diseases: Secondary | ICD-10-CM

## 2019-09-22 DIAGNOSIS — E8881 Metabolic syndrome: Secondary | ICD-10-CM

## 2019-09-22 DIAGNOSIS — R918 Other nonspecific abnormal finding of lung field: Secondary | ICD-10-CM

## 2019-09-22 DIAGNOSIS — I700 Atherosclerosis of aorta: Secondary | ICD-10-CM | POA: Diagnosis not present

## 2019-09-22 DIAGNOSIS — E88819 Insulin resistance, unspecified: Secondary | ICD-10-CM

## 2019-09-22 DIAGNOSIS — Z950 Presence of cardiac pacemaker: Secondary | ICD-10-CM

## 2019-09-22 DIAGNOSIS — Z9989 Dependence on other enabling machines and devices: Secondary | ICD-10-CM

## 2019-09-22 DIAGNOSIS — R29898 Other symptoms and signs involving the musculoskeletal system: Secondary | ICD-10-CM

## 2019-09-22 DIAGNOSIS — E781 Pure hyperglyceridemia: Secondary | ICD-10-CM

## 2019-09-22 DIAGNOSIS — I495 Sick sinus syndrome: Secondary | ICD-10-CM

## 2019-09-22 DIAGNOSIS — G4733 Obstructive sleep apnea (adult) (pediatric): Secondary | ICD-10-CM

## 2019-09-22 DIAGNOSIS — E559 Vitamin D deficiency, unspecified: Secondary | ICD-10-CM

## 2019-09-22 NOTE — Telephone Encounter (Signed)
If here, Dr. Rogers Blocker. If open to another office, Dr. Alden Hipp or Dr. Ethlyn Gallery at [place].

## 2019-09-22 NOTE — Telephone Encounter (Signed)
Called patient and gave information sent in my chart to her as well.

## 2019-09-23 ENCOUNTER — Other Ambulatory Visit: Payer: Self-pay

## 2019-09-23 ENCOUNTER — Encounter: Payer: Self-pay | Admitting: Rheumatology

## 2019-09-23 ENCOUNTER — Ambulatory Visit (INDEPENDENT_AMBULATORY_CARE_PROVIDER_SITE_OTHER): Payer: Medicare Other

## 2019-09-23 DIAGNOSIS — Z0000 Encounter for general adult medical examination without abnormal findings: Secondary | ICD-10-CM

## 2019-09-23 DIAGNOSIS — R768 Other specified abnormal immunological findings in serum: Secondary | ICD-10-CM | POA: Diagnosis not present

## 2019-09-23 DIAGNOSIS — D8989 Other specified disorders involving the immune mechanism, not elsewhere classified: Secondary | ICD-10-CM | POA: Diagnosis not present

## 2019-09-23 NOTE — Progress Notes (Signed)
I connected with Dwain Sarna on 09/23/19 at 11:15 by phone and verified that I am speaking with the correct person using two identifiers. Location patient: Home Location provider: [facility], Office Persons participating in the virtual visit: Denman George LPN and Dr. Billey Chang    I discussed the limitations of evaluation and management by telemedicine and the availability of in person appointments. The patient expressed understanding and agreed to proceed.   Subjective:   [name] [name] is a 78 y.o. female who presents for Medicare Annual (Subsequent) preventive examination.  Review of Systems:   Cardiac Risk Factors include: advanced age (>92mn, >>35women);hypertension;sedentary lifestyle     Objective:     Vitals: LMP  (LMP Unknown)   There is no height or weight on file to calculate BMI.  Advanced Directives 09/23/2019 01/21/2018 09/02/2017 06/09/2017 01/15/2017 11/30/2015 08/21/2015  Does Patient Have a Medical Advance Directive? _0  Yes Yes  Type of Advance Directive Living will;Healthcare Power of [place] Living will;Healthcare Power of [place]  Does patient want to make changes to medical advance directive? No - Patient declined - - - - No - Patient declined -  Copy of [place] Chart? Yes - validated most recent copy scanned in chart (See row information) No - copy requested No - copy requested No - copy requested No - copy requested No - copy requested -    Tobacco Social History   Tobacco Use  Smoking Status Former Smoker  . Packs/day: 2.00  . Years: 18.00  . Pack years: 36.00  . Types: Cigarettes  . Quit date: 07/18/1970  . Years since quitting: 49.2  Smokeless Tobacco Never Used  Tobacco Comment   "quit smoking in 1971"      Counseling given: Not Answered Comment: "quit smoking in 1971"   Clinical Intake:  Pre-visit preparation completed: Yes  Pain : No/denies pain  Diabetes: No  How often do you need to have someone help you when you read instructions, pamphlets, or other written materials from your doctor or pharmacy?: 2 - Rarely  Interpreter Needed?: No  Information entered by :: CDenman GeorgeLPN  Past Medical History:  Diagnosis Date  . Alpha-1-antitrypsin deficiency carrier   . Arthritis    "all over"  . Atrial fibrillation ([place]   . Back pain   . Complication of anesthesia    "I woke up during 2 different procedures" (10/04/2014)  . Frequent UTI   . GERD (gastroesophageal reflux disease)   . Hypertension   . MVP (mitral valve prolapse)   . Obesity   . Osteoarthritis   . Presence of permanent cardiac pacemaker   . PVC's (premature ventricular contractions)   . Shortness of breath   . Sinus arrest 10/2014   s/p Medtronic Advisa model [id number] number [id number]  . Sleep apnea   . Stroke ([facility] 01/2014   denies deficits on 10/04/2014  . Tachy-brady syndrome ([place] 10/2014  . TIA (transient ischemic attack)    Past Surgical History:  Procedure Laterality Date  . CARDIOVERSION N/A 09/02/2017   Procedure: CARDIOVERSION;  Surgeon: CSanda Klein MD;  Location: [facility] ENDOSCOPY;  Service: Cardiovascular;  Laterality: N/A;  . ESOPHAGEAL DILATION    . EXCISION VAGINAL CYST     benign nodules  . INSERT / REPLACE / REMOVE PACEMAKER  10/04/2014  Medtronic Advisa model [id number] serial number [id number]  . LOOP RECORDER EXPLANT N/A 10/04/2014   Procedure: LOOP RECORDER EXPLANT;  Surgeon: Sanda Klein, MD;  Location: [facility];  Service: Cardiovascular;  Laterality: N/A;  . LOOP RECORDER IMPLANT N/A 04/19/2014   Procedure: LOOP RECORDER IMPLANT;  Surgeon: Sanda Klein, MD;  Location: [facility] CATH LAB;  Service: Cardiovascular;  Laterality: N/A;  . NM MYOCAR PERF WALL MOTION  12/18/2007   normal   . PERMANENT PACEMAKER INSERTION N/A 10/04/2014   Procedure: PERMANENT PACEMAKER INSERTION;  Surgeon: Sanda Klein, MD;  Location: [facility] CATH LAB;  Service: Cardiovascular;  Laterality: N/A;  . TUBAL LIGATION    . US ECHOCARDIOGRAPHY  05/15/2010   LA mildly dilated,mild mitral annular ca+, AOV mildly sclerotic, mild asymmetric LVH   Family History  Problem Relation Age of Onset  . Heart attack Mother   . Sudden death Mother   . Stroke Father   . Heart failure Father   . Alcoholism Brother   . Healthy Sister   . Stroke Brother   . Alpha-1 antitrypsin deficiency Daughter   . Breast cancer Daughter   . Prostate cancer Brother   . Stroke Brother   . Prostate cancer Brother   . Healthy Brother   . Prostate cancer Brother   . Healthy Daughter   . Diabetes Son   . Arthritis Son    Social History   Socioeconomic History  . Marital status: Divorced    Spouse name: Not on file  . Number of children: 3  . Years of education: college  . Highest education level: Not on file  Occupational History  . Occupation: Retired Engineer, site  Social Needs  . Financial resource strain: Not on file  . Food insecurity    Worry: Not on file    Inability: Not on file  . Transportation needs    Medical: Not on file    Non-medical: Not on file  Tobacco Use  . Smoking status: Former Smoker    Packs/day: 2.00    Years: 18.00    Pack years: 36.00    Types: Cigarettes    Quit date: 07/18/1970    Years since quitting: 49.2  . Smokeless tobacco: Never Used  . Tobacco comment: "quit smoking in 1971"  Substance and Sexual Activity  . Alcohol use: Yes    Alcohol/week: 0.0 standard drinks    Comment: 10/04/2014 "might have a drink a couple times/yr"  . Drug use: No  . Sexual activity: Never  Lifestyle  . Physical activity    Days per week: Not on file    Minutes per session: Not on file  . Stress: Not on file  Relationships  . Social Herbalist on phone: Not on file    Gets  together: Not on file    Attends religious service: Not on file    Active member of club or organization: Not on file    Attends meetings of clubs or organizations: Not on file    Relationship status: Not on file  Other Topics Concern  . Not on file  Social History Narrative   Lives at the [facility]    Patient is right handed.   Patient has a college degree.   Patient is retired.    Outpatient Encounter Medications as of 09/23/2019  Medication Sig  . apixaban (ELIQUIS) 5 MG TABS tablet Take 1 tablet (5 mg total) by mouth 2 (two) times daily.  . Biotin  5000 MCG CAPS Take 5,000 mcg by mouth daily.   . Calcium Carbonate-Vit D-Min (CALCIUM 1200 PO) Take 2,400 mg by mouth daily.   . cholecalciferol (VITAMIN D) 1000 UNITS tablet Take 1,000 Units by mouth daily.   Marland Kitchen Co-Enzyme Q10 100 MG CAPS Take 100 mg by mouth daily.   . diazepam (VALIUM) 5 MG tablet Take 1 tablet po daily (Patient taking differently: as needed. Take 1 tablet po daily)  . fluticasone (FLONASE) 50 MCG/ACT nasal spray Place 2 sprays into both nostrils daily. (Patient not taking: Reported on 09/22/2019)  . folic acid (FOLVITE) 778 MCG tablet Take 400 mcg by mouth daily.  . furosemide (LASIX) 20 MG tablet Take 1 tablet (20 mg total) by mouth daily.  Marland Kitchen glucosamine-chondroitin 500-400 MG tablet Take 1 tablet by mouth daily.   Marland Kitchen loratadine (KLS ALLERCLEAR) 10 MG tablet Take 10 mg by mouth daily.  Marland Kitchen MAGNESIUM CITRATE PO Take 250 mg by mouth daily. 1 tab daily  . nitroGLYCERIN (NITROSTAT) 0.4 MG SL tablet Place 0.4 mg under the tongue every 5 (five) minutes as needed for chest pain.  Marland Kitchen omeprazole (PRILOSEC) 20 MG capsule Take 20 mg by mouth daily.  Marland Kitchen OVER THE COUNTER MEDICATION CBD oil  . Resveratrol 250 MG CAPS Take 250 mg by mouth daily.  . sertraline (ZOLOFT) 100 MG tablet Take 1 tablet (100 mg total) by mouth daily.   No facility-administered encounter medications on file as of 09/23/2019.     Activities of Daily Living  In your present state of health, do you have any difficulty performing the following activities: 09/23/2019 07/30/2019  Hearing? N N  Vision? N N  Difficulty concentrating or making decisions? N N  Walking or climbing stairs? N N  Dressing or bathing? N N  Doing errands, shopping? N N  Preparing Food and eating ? N -  Using the Toilet? N -  In the past six months, have you accidently leaked urine? N -  Do you have problems with loss of bowel control? N -  Managing your Medications? N -  Managing your Finances? N -  Housekeeping or managing your Housekeeping? N -  Some recent data might be hidden    Patient Care Team: Briscoe Deutscher, DO as PCP - General (Family Medicine) Croitoru, Dani Gobble, MD as PCP - Cardiology (Cardiology) Maisie Fus, MD as Consulting Physician (Obstetrics and Gynecology) Richmond Campbell, MD as Consulting Physician (Gastroenterology) Croitoru, Dani Gobble, MD as Consulting Physician (Cardiology) Kathie Rhodes, MD as Consulting Physician (Urology) Danella Sensing, MD as Consulting Physician (Dermatology) Brand Males, MD as Consulting Physician (Pulmonary Disease) Bo Merino, MD as Consulting Physician (Rheumatology) Barbaraann Cao, OD as Referring Physician (Optometry)    Assessment:   This is a routine wellness examination for Rorie.  Exercise Activities and Dietary recommendations Current Exercise Habits: The patient does not participate in regular exercise at present  Goals    . Patient Stated     Lose weight by increasing activity.     . Weight (lb) < 240 lb (108.9 kg)     Would like to lose weight by increasing activity and making healthier food choices.        Fall Risk Fall Risk  09/23/2019 04/01/2019 05/07/2018 01/21/2018 01/15/2018  Falls in the past year? 1 0 Yes Yes No  Number falls in past yr: 0 0 1 1 -  Injury with Fall? 1 0 Yes Yes -  Risk Factor Category  - - - High Fall Risk -  Risk for fall due to : History of  fall(s);Impaired mobility - - - -  Follow up Education provided;Falls prevention discussed;Falls evaluation completed - - Falls prevention discussed -   Is the patient's home free of loose throw rugs in walkways, pet beds, electrical cords, etc?   yes      Grab bars in the bathroom? yes      Handrails on the stairs?   yes      Adequate lighting?   yes  Depression Screen PHQ 2/9 Scores 09/23/2019 07/30/2019 04/01/2019 01/04/2019  PHQ - 2 Score 0 0 1 3  PHQ- 9 Score - 0 4 11     Cognitive Function-no cognitive concerns at this time  MMSE - Mini Mental State Exam 01/21/2018  Orientation to time 5  Orientation to Place 5  Registration 3  Attention/ Calculation 5  Recall 3  Language- name 2 objects 2  Language- repeat 1  Language- follow 3 step command 3  Language- read & follow direction 1  Write a sentence 1  Copy design 1  Total score 30     6CIT Screen 09/23/2019  What Year? 0 points  What month? 0 points  What time? 0 points  Count back from 20 0 points  Months in reverse 0 points  Repeat phrase 0 points  Total Score 0    Immunization History  Administered Date(s) Administered  . Influenza Whole 09/23/2008  . Influenza, High Dose Seasonal PF 09/13/2018  . Influenza,inj,quad, With Preservative 09/04/2017  . Influenza-Unspecified 09/17/2014, 09/09/2016, 09/04/2017  . Pneumococcal Conjugate-13 10/16/2015  . Pneumococcal Polysaccharide-23 10/02/2017  . Pneumococcal-Unspecified 10/01/2014, 10/02/2017  . Zoster Recombinat (Shingrix) 09/29/2018, 01/19/2019    Qualifies for Shingles Vaccine? Shingrix completed   Screening Tests Health Maintenance  Topic Date Due  . TETANUS/TDAP  10/21/1960  . INFLUENZA VACCINE  07/03/2019  . MAMMOGRAM  02/10/2020  . DEXA SCAN  Completed  . PNA vac Low Risk Adult  Completed    Cancer Screenings: Lung: Low Dose CT Chest recommended if Age 60-80 years, 30 pack-year currently smoking OR have quit w/in 15years. Patient does not qualify.  Breast:  Up to date on Mammogram? Yes   Up to date of Bone Density/Dexa? Yes Colorectal: colonoscopy 07/11/16     Plan:  I have personally reviewed and addressed the Medicare Annual Wellness questionnaire and have noted the following in the patient's chart:  A. Medical and social history B. Use of alcohol, tobacco or illicit drugs  C. Current medications and supplements D. Functional ability and status E.  Nutritional status F.  Physical activity G. Advance directives H. List of other physicians I.  Hospitalizations, surgeries, and ER visits in previous 12 months J.  [place] such as hearing and vision if needed, cognitive and depression L. Referrals, records requested, and appointments- none   In addition, I have reviewed and discussed with patient certain preventive protocols, quality metrics, and best practice recommendations. A written personalized care plan for preventive services as well as general preventive health recommendations were provided to patient.   Signed,  Denman George, LPN  Nurse Health Advisor   Nurse Notes: no additional

## 2019-09-23 NOTE — Patient Instructions (Signed)
[name] [name] , Thank you for taking time to come for your Medicare Wellness Visit. I appreciate your ongoing commitment to your health goals. Please review the following plan we discussed and let me know if I can assist you in the future.   Screening recommendations/referrals: Colorectal Screening: up to date; last 07/11/16 Mammogram: up to date;last 02/10/19 Bone Density: up to date; last 06/01/16  Vision and Dental Exams: Recommended annual ophthalmology exams for early detection of glaucoma and other disorders of the eye Recommended annual dental exams for proper oral hygiene  Vaccinations: Influenza vaccine:  recommended this fall either at PCP office or through your local pharmacy  Pneumococcal vaccine: up to date; last 10/02/17 Tdap vaccine: recommended-Please call your insurance company to determine your out of pocket expense. You may receive this vaccine at your local pharmacy or Health Dept. Shingles vaccine: Shingrix completed   Advanced directives: We have received a copy of your POA (Power of [place]) and/or Living Will. These documents can be located in your chart.  Goals: Recommend to drink at least 6-8 8oz glasses of water per day and consume a balanced diet rich in fresh fruits and vegetables.   Next appointment: Please schedule your Annual Wellness Visit with your Nurse Health Advisor in one year.  Preventive Care 44 Years and Older, Female Preventive care refers to lifestyle choices and visits with your health care provider that can promote health and wellness. What does preventive care include?  A yearly physical exam. This is also called an annual well check.  Dental exams once or twice a year.  Routine eye exams. Ask your health care provider how often you should have your eyes checked.  Personal lifestyle choices, including:  Daily care of your teeth and gums.  Regular physical activity.  Eating a healthy diet.  Avoiding tobacco and drug use.  Limiting  alcohol use.  Practicing safe sex.  Taking low-dose aspirin every day if recommended by your health care provider.  Taking vitamin and mineral supplements as recommended by your health care provider. What happens during an annual well check? The services and screenings done by your health care provider during your annual well check will depend on your age, overall health, lifestyle risk factors, and family history of disease. Counseling  Your health care provider may ask you questions about your:  Alcohol use.  Tobacco use.  Drug use.  Emotional well-being.  Home and relationship well-being.  Sexual activity.  Eating habits.  History of falls.  Memory and ability to understand (cognition).  Work and work Statistician.  Reproductive health. Screening  You may have the following tests or measurements:  Height, weight, and BMI.  Blood pressure.  Lipid and cholesterol levels. These may be checked every 5 years, or more frequently if you are over 18 years old.  Skin check.  Lung cancer screening. You may have this screening every year starting at age 21 if you have a 30-pack-year history of smoking and currently smoke or have quit within the past 15 years.  Fecal occult blood test (FOBT) of the stool. You may have this test every year starting at age 68.  Flexible sigmoidoscopy or colonoscopy. You may have a sigmoidoscopy every 5 years or a colonoscopy every 10 years starting at age 54.  Hepatitis C blood test.  Hepatitis B blood test.  Sexually transmitted disease (STD) testing.  Diabetes screening. This is done by checking your blood sugar (glucose) after you have not eaten for a while (fasting). You  may have this done every 1-3 years.  Bone density scan. This is done to screen for osteoporosis. You may have this done starting at age 68.  Mammogram. This may be done every 1-2 years. Talk to your health care provider about how often you should have regular  mammograms. Talk with your health care provider about your test results, treatment options, and if necessary, the need for more tests. Vaccines  Your health care provider may recommend certain vaccines, such as:  Influenza vaccine. This is recommended every year.  Tetanus, diphtheria, and acellular pertussis (Tdap, Td) vaccine. You may need a Td booster every 10 years.  Zoster vaccine. You may need this after age 32.  Pneumococcal 13-valent conjugate (PCV13) vaccine. One dose is recommended after age 60.  Pneumococcal polysaccharide (PPSV23) vaccine. One dose is recommended after age 2. Talk to your health care provider about which screenings and vaccines you need and how often you need them. This information is not intended to replace advice given to you by your health care provider. Make sure you discuss any questions you have with your health care provider. Document Released: 12/15/2015 Document Revised: 08/07/2016 Document Reviewed: 09/19/2015 Elsevier Interactive Patient Education  2017 [place] Prevention in the Home Falls can cause injuries. They can happen to people of all ages. There are many things you can do to make your home safe and to help prevent falls. What can I do on the outside of my home?  Regularly fix the edges of walkways and driveways and fix any cracks.  Remove anything that might make you trip as you walk through a door, such as a raised step or threshold.  Trim any bushes or trees on the path to your home.  Use bright outdoor lighting.  Clear any walking paths of anything that might make someone trip, such as rocks or tools.  Regularly check to see if handrails are loose or broken. Make sure that both sides of any steps have handrails.  Any raised decks and porches should have guardrails on the edges.  Have any leaves, snow, or ice cleared regularly.  Use sand or salt on walking paths during winter.  Clean up any spills in your garage  right away. This includes oil or grease spills. What can I do in the bathroom?  Use night lights.  Install grab bars by the toilet and in the tub and shower. Do not use towel bars as grab bars.  Use non-skid mats or decals in the tub or shower.  If you need to sit down in the shower, use a plastic, non-slip stool.  Keep the floor dry. Clean up any water that spills on the floor as soon as it happens.  Remove soap buildup in the tub or shower regularly.  Attach bath mats securely with double-sided non-slip rug tape.  Do not have throw rugs and other things on the floor that can make you trip. What can I do in the bedroom?  Use night lights.  Make sure that you have a light by your bed that is easy to reach.  Do not use any sheets or blankets that are too big for your bed. They should not hang down onto the floor.  Have a firm chair that has side arms. You can use this for support while you get dressed.  Do not have throw rugs and other things on the floor that can make you trip. What can I do in the kitchen?  Clean  up any spills right away.  Avoid walking on wet floors.  Keep items that you use a lot in easy-to-reach places.  If you need to reach something above you, use a strong step stool that has a grab bar.  Keep electrical cords out of the way.  Do not use floor polish or wax that makes floors slippery. If you must use wax, use non-skid floor wax.  Do not have throw rugs and other things on the floor that can make you trip. What can I do with my stairs?  Do not leave any items on the stairs.  Make sure that there are handrails on both sides of the stairs and use them. Fix handrails that are broken or loose. Make sure that handrails are as long as the stairways.  Check any carpeting to make sure that it is firmly attached to the stairs. Fix any carpet that is loose or worn.  Avoid having throw rugs at the top or bottom of the stairs. If you do have throw rugs,  attach them to the floor with carpet tape.  Make sure that you have a light switch at the top of the stairs and the bottom of the stairs. If you do not have them, ask someone to add them for you. What else can I do to help prevent falls?  Wear shoes that:  Do not have high heels.  Have rubber bottoms.  Are comfortable and fit you well.  Are closed at the toe. Do not wear sandals.  If you use a stepladder:  Make sure that it is fully opened. Do not climb a closed stepladder.  Make sure that both sides of the stepladder are locked into place.  Ask someone to hold it for you, if possible.  Clearly mark and make sure that you can see:  Any grab bars or handrails.  First and last steps.  Where the edge of each step is.  Use tools that help you move around (mobility aids) if they are needed. These include:  Canes.  Walkers.  Scooters.  Crutches.  Turn on the lights when you go into a dark area. Replace any light bulbs as soon as they burn out.  Set up your furniture so you have a clear path. Avoid moving your furniture around.  If any of your floors are uneven, fix them.  If there are any pets around you, be aware of where they are.  Review your medicines with your doctor. Some medicines can make you feel dizzy. This can increase your chance of falling. Ask your doctor what other things that you can do to help prevent falls. This information is not intended to replace advice given to you by your health care provider. Make sure you discuss any questions you have with your health care provider. Document Released: 09/14/2009 Document Revised: 04/25/2016 Document Reviewed: 12/23/2014 Elsevier Interactive Patient Education  2017 [employer].

## 2019-09-23 NOTE — Progress Notes (Signed)
I have reviewed the documentation from the recent AWV done by Denman George, RN; I agree with the documentation and will follow up on any recommendations or abnormal findings as suggested.

## 2019-09-24 ENCOUNTER — Telehealth: Payer: Self-pay | Admitting: Cardiovascular Disease

## 2019-09-24 NOTE — Telephone Encounter (Signed)
Patient calling the office for samples of medication:   1.  What medication and dosage are you requesting samples for? apixaban (ELIQUIS) 5 MG TABS tablet  2.  Are you currently out of this medication?  Pt will be out by Tuesday

## 2019-09-24 NOTE — Telephone Encounter (Signed)
Called patient to inform her that we do not have any samples of Eliquis today. Advised her to contact the office if she needed a rx sent over to the pharmacy.

## 2019-09-27 DIAGNOSIS — Z23 Encounter for immunization: Secondary | ICD-10-CM | POA: Diagnosis not present

## 2019-09-30 ENCOUNTER — Telehealth: Payer: Self-pay | Admitting: *Deleted

## 2019-09-30 NOTE — Telephone Encounter (Signed)
Patient called in requesting samples of Eliquis  Medication samples have been provided to the patient.  Drug name: Eliquis  Qty: 2 boxes  LOT: [id number]  Exp.Date: 11/2021  Samples left at front desk for patient pick-up. Patient notified.

## 2019-10-04 NOTE — Progress Notes (Signed)
Office Visit Note  Patient: [name] [name]             Date of Birth: 07/26/1941           MRN: [id number]             PCP: Briscoe Deutscher, DO Referring: Briscoe Deutscher, DO Visit Date: 10/14/2019 Occupation: _0 @  Subjective:  Joint stiffness.   History of Present Illness: [name] [name] is a 78 y.o. female with history of pulmonary fibrosis and osteoarthritis.  She states that she continues to have some stiffness in her hands lower back and her knee joints.  She has not noticed any joint swelling.  She states she had Visco supplement injections to her knee joints without much relief.  She has pulmonary fibrosis and has been followed by Dr. Chase Caller.  She also has congestive heart failure.  She states she has been trying to swim which has been helpful.  She is not able to walk as much.  She gets shortness of breath easily.  Activities of Daily Living:  Patient reports morning stiffness for 24 hours.   Patient Reports nocturnal pain.  Difficulty dressing/grooming: Denies Difficulty climbing stairs: Reports Difficulty getting out of chair: Reports Difficulty using hands for taps, buttons, cutlery, and/or writing: Reports  Review of Systems  Constitutional: Positive for fatigue. Negative for night sweats, weight gain and weight loss.  HENT: Positive for nose dryness. Negative for mouth sores, trouble swallowing, trouble swallowing and mouth dryness.   Eyes: Negative for pain, redness, itching, visual disturbance and dryness.  Respiratory: Positive for shortness of breath. Negative for cough and difficulty breathing.   Cardiovascular: Negative for chest pain, palpitations, hypertension, irregular heartbeat and swelling in legs/feet.  Gastrointestinal: Negative for blood in stool, constipation and diarrhea.  Endocrine: Negative for increased urination.  Genitourinary: Negative for difficulty urinating, painful urination and vaginal dryness.  Musculoskeletal: Positive for  arthralgias, joint pain and morning stiffness. Negative for joint swelling, myalgias, muscle weakness, muscle tenderness and myalgias.  Skin: Negative for color change, rash, hair loss, skin tightness, ulcers and sensitivity to sunlight.  Allergic/Immunologic: Negative for susceptible to infections.  Neurological: Positive for weakness. Negative for dizziness, numbness, headaches, memory loss and night sweats.  Hematological: Negative for bruising/bleeding tendency and swollen glands.  Psychiatric/Behavioral: Negative for depressed mood, confusion and sleep disturbance. The patient is not nervous/anxious.     PMFS History:  Patient Active Problem List   Diagnosis Date Noted  . Muscular deconditioning 05/30/2019  . 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 ASCVD (arteriosclerotic cardiovascular disease) risk score 18.9% 11/22/2018  . Depression 10/15/2018  . Seasonal allergies, using Flonase and Loratadine prn 10/15/2018  . Atherosclerosis of aorta ([place]) 10/15/2018  . Diastolic dysfunction 41/32/4401  . 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 Vitamin D deficiency 09/17/2017  . Class 3 severe obesity with serious comorbidity and body mass index (BMI) of 40.0 to 44.9 in adult ([place]) 09/17/2017  . Hypertriglyceridemia 09/17/2017  . Multiple lung nodules 05/23/2017  . Pulmonary fibrosis ([place]), followed by Pulmonolgy 05/23/2017  . Abnormal CT scan of lung 01/07/2017  . GERD (gastroesophageal reflux disease) 12/12/2016  . Dyspnea on exertion 12/10/2016  . Anxiety state 07/15/2016  . OSA on CPAP, compliant 05/04/2016  . Pacemaker 10/04/2014  . Long term current use of anticoagulant 06/27/2014  . PAF (paroxysmal atrial fibrillation) ([place]) 06/06/2014  . Tachy-brady syndrome ([place]) 06/06/2014  . TIA (transient ischemic attack) 03/08/2014  . Osteopenia 08/15/2008    Past Medical History:  Diagnosis Date  .  Alpha-1-antitrypsin deficiency carrier   . Arthritis    "all over"  . Atrial fibrillation ([place])   .  Back pain   . Complication of anesthesia    "I woke up during 2 different procedures" (10/04/2014)  . Frequent UTI   . GERD (gastroesophageal reflux disease)   . Hypertension   . MVP (mitral valve prolapse)   . Obesity   . Osteoarthritis   . Presence of permanent cardiac pacemaker   . PVC's (premature ventricular contractions)   . Shortness of breath   . 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 Sleep apnea   . Stroke [facility]) 01/2014   denies deficits on 10/04/2014  . Tachy-brady syndrome ([place]) 10/2014  . TIA (transient ischemic attack)     Family History  Problem Relation Age of Onset  . Heart attack Mother   . Sudden death Mother   . Stroke Father   . Heart failure Father   . Alcoholism Brother   . Healthy Sister   . Stroke Brother   . Alpha-1 antitrypsin deficiency Daughter   . Breast cancer Daughter   . Prostate cancer Brother   . Stroke Brother   . Prostate cancer Brother   . Healthy Brother   . Prostate cancer Brother   . Healthy Daughter   . Diabetes Son   . Arthritis Son    Past Surgical History:  Procedure Laterality Date  . CARDIOVERSION N/A 09/02/2017   Procedure: CARDIOVERSION;  Surgeon: Sanda Klein, MD;  Location: [facility] ENDOSCOPY;  Service: Cardiovascular;  Laterality: N/A;  . ESOPHAGEAL DILATION    . EXCISION VAGINAL CYST     benign nodules  . INSERT / REPLACE / REMOVE PACEMAKER  10/04/2014   Medtronic Advisa model [id number] serial number [id number]  . LOOP RECORDER EXPLANT N/A 10/04/2014   Procedure: LOOP RECORDER EXPLANT;  Surgeon: Sanda Klein, MD;  Location: [facility] CATH LAB;  Service: Cardiovascular;  Laterality: N/A;  . LOOP RECORDER IMPLANT N/A 04/19/2014   Procedure: LOOP RECORDER IMPLANT;  Surgeon: Sanda Klein, MD;  Location: [facility] CATH LAB;  Service: Cardiovascular;  Laterality: N/A;  . NM MYOCAR PERF WALL MOTION  12/18/2007   normal  . PERMANENT PACEMAKER INSERTION N/A 10/04/2014   Procedure: PERMANENT  PACEMAKER INSERTION;  Surgeon: Sanda Klein, MD;  Location: [place] CATH LAB;  Service: Cardiovascular;  Laterality: N/A;  . TUBAL LIGATION    . US ECHOCARDIOGRAPHY  05/15/2010   LA mildly dilated,mild mitral annular ca+, AOV mildly sclerotic, mild asymmetric LVH   Social History   Social History Narrative   Lives at the [facility]    Patient is right handed.   Patient has a college degree.   Patient is retired.   Immunization History  Administered Date(s) Administered  . Influenza Whole 09/23/2008  . Influenza, High Dose Seasonal PF 09/13/2018  . Influenza,inj,quad, With Preservative 09/04/2017  . Influenza-Unspecified 09/17/2014, 09/09/2016, 09/04/2017  . Pneumococcal Conjugate-13 10/16/2015  . Pneumococcal Polysaccharide-23 10/02/2017  . Pneumococcal-Unspecified 10/01/2014, 10/02/2017  . Zoster Recombinat (Shingrix) 09/29/2018, 01/19/2019     Objective: Vital Signs: BP 139/81 (BP Location: Right Wrist, Patient Position: Sitting, Cuff Size: Normal)   Pulse 87   Resp 15   Ht 5' 6.5" (1.689 m)   Wt 261 lb 3.2 oz (118.5 kg)   LMP  (LMP Unknown)   BMI 41.53 kg/m    Physical Exam Vitals signs and nursing note reviewed.  Constitutional:      Appearance: She  is well-developed.  HENT:     Head: Normocephalic and atraumatic.  Eyes:     Conjunctiva/sclera: Conjunctivae normal.  Neck:     Musculoskeletal: Normal range of motion.  Cardiovascular:     Rate and Rhythm: Normal rate and regular rhythm.     Heart sounds: Normal heart sounds.  Pulmonary:     Effort: Pulmonary effort is normal.     Breath sounds: Normal breath sounds.  Abdominal:     General: Bowel sounds are normal.     Palpations: Abdomen is soft.  Lymphadenopathy:     Cervical: No cervical adenopathy.  Skin:    General: Skin is warm and dry.     Capillary Refill: Capillary refill takes less than 2 seconds.  Neurological:     Mental Status: She is alert and oriented to person, place, and time.   Psychiatric:        Behavior: Behavior normal.      Musculoskeletal Exam: C-spine was in good range of motion.  Shoulder joints elbow joints wrist joints with good range of motion.  She had bilateral CMC PIP and DIP thickening with no synovitis.  She has discomfort range of motion of bilateral knee joints without any warmth swelling or effusion.  CDAI Exam: CDAI Score: - Patient Global: -; Provider Global: - Swollen: -; Tender: - Joint Exam   No joint exam has been documented for this visit   There is currently no information documented on the homunculus. Go to the Rheumatology activity and complete the homunculus joint exam.  Investigation: No additional findings.  Imaging: No results found.  Recent Labs: Lab Results  Component Value Date   WBC 9.3 07/21/2019   HGB 14.1 07/21/2019   PLT 275 07/21/2019   NA 138 07/21/2019   K 4.4 07/21/2019   CL 99 07/21/2019   CO2 23 07/21/2019   GLUCOSE 101 (H) 07/21/2019   BUN 26 07/21/2019   CREATININE 0.98 07/21/2019   BILITOT 0.3 07/21/2019   ALKPHOS 80 07/21/2019   AST 22 07/21/2019   ALT 14 07/21/2019   PROT 6.5 07/21/2019   ALBUMIN 4.1 07/21/2019   CALCIUM 9.4 07/21/2019   GFRAA 64 07/21/2019   07/21/19: ANA 1:160 nuclear, dense fine speckled, ENA-, CK 98, TSH 2.79, BNP 81, sed rate 15, ACE 48, RF<14, CCP<16, ANCA-  AVISE index -1.1, ANA titer negative, ENA negative, CB CAP negative, Jo 1 -, RF negative, anti-CCP negative, anticardiolipin negative, beta-2 negative, antithyroglobulin negative, anti-TPO negative  Speciality Comments: No specialty comments available.  Procedures:  No procedures performed Allergies: Ancef [cefazolin], Pseudoephedrine hcl, Ergotamine, Pentobarbital, and Caffeine   Assessment / Plan:     Visit Diagnoses: Positive ANA (antinuclear antibody) - ANA 1: 160 nuclear, speckled, AVISE index -1.1.  Complete panel was negative including ANA complements and anticardiolipin antibodies.  She has no  clinical features of autoimmune disease on examination.  Pulmonary fibrosis ([place]), followed by Dr. Chase Caller.  Multiple lung nodules  Primary osteoarthritis of both hands -she continues to have some joint stiffness.  Joint protection was discussed.  Primary osteoarthritis of left knee-she has had Visco supplement injections without much relief.  We had detailed discussion regarding weight loss diet and exercise.  Muscular deconditioning-she has been trying to exercise to swimming.  Osteopenia of multiple sites-she gets bone density through her PCP.  Vitamin D deficiency  Presbyesophagus  Gastroesophageal reflux disease without esophagitis  TIA (transient ischemic attack)  Tachy-brady syndrome (HCC)  Atherosclerosis of aorta (HCC)  PAF (paroxysmal  atrial fibrillation) (HCC)  Pacemaker  Diastolic dysfunction  Long term current use of anticoagulant  Hypertriglyceridemia  Insulin resistance  OSA on CPAP, compliant  Orders: No orders of the defined types were placed in this encounter.  No orders of the defined types were placed in this encounter.   Face-to-face time spent with patient was 25 minutes. Greater than 50% of time was spent in counseling and coordination of care.  Follow-Up Instructions: Return if symptoms worsen or fail to improve, for Osteoarthritis, Pulm fibrosis.   Bo Merino, MD  Note - This record has been created using Editor, commissioning.  Chart creation errors have been sought, but may not always  have been located. Such creation errors do not reflect on  the standard of medical care.

## 2019-10-12 ENCOUNTER — Telehealth: Payer: Self-pay | Admitting: Cardiovascular Disease

## 2019-10-12 NOTE — Telephone Encounter (Signed)
Called patient - have 2 weeks available 5 mg  Lot [id number]  Exp 12/22

## 2019-10-12 NOTE — Telephone Encounter (Signed)
I am covering remotely from [place].  Fwd to pharm.

## 2019-10-12 NOTE — Telephone Encounter (Signed)
New Message    Patient calling the office for samples of medication:   1.  What medication and dosage are you requesting samples for? Eliquis 5 mg   2.  Are you currently out of this medication? Pt has enough for 2 more days

## 2019-10-13 ENCOUNTER — Ambulatory Visit (INDEPENDENT_AMBULATORY_CARE_PROVIDER_SITE_OTHER): Payer: Medicare Other | Admitting: *Deleted

## 2019-10-13 DIAGNOSIS — I495 Sick sinus syndrome: Secondary | ICD-10-CM

## 2019-10-13 DIAGNOSIS — I480 Paroxysmal atrial fibrillation: Secondary | ICD-10-CM | POA: Diagnosis not present

## 2019-10-13 LAB — CUP PACEART REMOTE DEVICE CHECK
Battery Remaining Longevity: 69 mo
Battery Voltage: 3 V
Brady Statistic AP VP Percent: 0.03 %
Brady Statistic AP VS Percent: 33.92 %
Brady Statistic AS VP Percent: 0.03 %
Brady Statistic AS VS Percent: 66.03 %
Brady Statistic RA Percent Paced: 33.77 %
Brady Statistic RV Percent Paced: 0.05 %
Date Time Interrogation Session: 20201111121403
Implantable Lead Implant Date: 20151103
Implantable Lead Implant Date: 20151103
Implantable Lead Location: 753859
Implantable Lead Location: 753860
Implantable Lead Model: 5076
Implantable Lead Model: 5076
Implantable Pulse Generator Implant Date: 20151103
Lead Channel Impedance Value: 418 Ohm
Lead Channel Impedance Value: 475 Ohm
Lead Channel Impedance Value: 494 Ohm
Lead Channel Impedance Value: 532 Ohm
Lead Channel Pacing Threshold Amplitude: 0.5 V
Lead Channel Pacing Threshold Amplitude: 0.625 V
Lead Channel Pacing Threshold Pulse Width: 0.4 ms
Lead Channel Pacing Threshold Pulse Width: 0.4 ms
Lead Channel Sensing Intrinsic Amplitude: 3 mV
Lead Channel Sensing Intrinsic Amplitude: 3 mV
Lead Channel Sensing Intrinsic Amplitude: 6.75 mV
Lead Channel Sensing Intrinsic Amplitude: 6.75 mV
Lead Channel Setting Pacing Amplitude: 1.5 V
Lead Channel Setting Pacing Amplitude: 2 V
Lead Channel Setting Pacing Pulse Width: 0.4 ms
Lead Channel Setting Sensing Sensitivity: 2.8 mV

## 2019-10-14 ENCOUNTER — Encounter: Payer: Self-pay | Admitting: Rheumatology

## 2019-10-14 ENCOUNTER — Ambulatory Visit (INDEPENDENT_AMBULATORY_CARE_PROVIDER_SITE_OTHER): Payer: Medicare Other | Admitting: Rheumatology

## 2019-10-14 ENCOUNTER — Other Ambulatory Visit: Payer: Self-pay

## 2019-10-14 VITALS — BP 139/81 | HR 87 | Resp 15 | Ht 66.5 in | Wt 261.2 lb

## 2019-10-14 DIAGNOSIS — M19042 Primary osteoarthritis, left hand: Secondary | ICD-10-CM

## 2019-10-14 DIAGNOSIS — Z950 Presence of cardiac pacemaker: Secondary | ICD-10-CM

## 2019-10-14 DIAGNOSIS — I495 Sick sinus syndrome: Secondary | ICD-10-CM

## 2019-10-14 DIAGNOSIS — E781 Pure hyperglyceridemia: Secondary | ICD-10-CM

## 2019-10-14 DIAGNOSIS — I5189 Other ill-defined heart diseases: Secondary | ICD-10-CM

## 2019-10-14 DIAGNOSIS — Z7901 Long term (current) use of anticoagulants: Secondary | ICD-10-CM

## 2019-10-14 DIAGNOSIS — J8410 Pulmonary fibrosis, unspecified: Secondary | ICD-10-CM | POA: Diagnosis not present

## 2019-10-14 DIAGNOSIS — M19041 Primary osteoarthritis, right hand: Secondary | ICD-10-CM | POA: Diagnosis not present

## 2019-10-14 DIAGNOSIS — E8881 Metabolic syndrome: Secondary | ICD-10-CM

## 2019-10-14 DIAGNOSIS — R768 Other specified abnormal immunological findings in serum: Secondary | ICD-10-CM | POA: Diagnosis not present

## 2019-10-14 DIAGNOSIS — M8589 Other specified disorders of bone density and structure, multiple sites: Secondary | ICD-10-CM

## 2019-10-14 DIAGNOSIS — K228 Other specified diseases of esophagus: Secondary | ICD-10-CM

## 2019-10-14 DIAGNOSIS — K219 Gastro-esophageal reflux disease without esophagitis: Secondary | ICD-10-CM | POA: Diagnosis not present

## 2019-10-14 DIAGNOSIS — Z9989 Dependence on other enabling machines and devices: Secondary | ICD-10-CM

## 2019-10-14 DIAGNOSIS — E88819 Insulin resistance, unspecified: Secondary | ICD-10-CM

## 2019-10-14 DIAGNOSIS — G459 Transient cerebral ischemic attack, unspecified: Secondary | ICD-10-CM | POA: Diagnosis not present

## 2019-10-14 DIAGNOSIS — I480 Paroxysmal atrial fibrillation: Secondary | ICD-10-CM

## 2019-10-14 DIAGNOSIS — I2510 Atherosclerotic heart disease of native coronary artery without angina pectoris: Secondary | ICD-10-CM

## 2019-10-14 DIAGNOSIS — G4733 Obstructive sleep apnea (adult) (pediatric): Secondary | ICD-10-CM

## 2019-10-14 DIAGNOSIS — I700 Atherosclerosis of aorta: Secondary | ICD-10-CM

## 2019-10-14 DIAGNOSIS — E559 Vitamin D deficiency, unspecified: Secondary | ICD-10-CM | POA: Diagnosis not present

## 2019-10-14 DIAGNOSIS — M1712 Unilateral primary osteoarthritis, left knee: Secondary | ICD-10-CM

## 2019-10-14 DIAGNOSIS — R918 Other nonspecific abnormal finding of lung field: Secondary | ICD-10-CM | POA: Diagnosis not present

## 2019-10-14 DIAGNOSIS — R29898 Other symptoms and signs involving the musculoskeletal system: Secondary | ICD-10-CM | POA: Diagnosis not present

## 2019-10-14 DIAGNOSIS — K2289 Other specified disease of esophagus: Secondary | ICD-10-CM

## 2019-10-26 ENCOUNTER — Telehealth: Payer: Self-pay | Admitting: Cardiovascular Disease

## 2019-10-26 NOTE — Telephone Encounter (Signed)
Patient calling the office for samples of medication:   1.  What medication and dosage are you requesting samples for? apixaban (ELIQUIS) 5 MG TABS tablet  2.  Are you currently out of this medication? No will be out of medication in a few days

## 2019-10-26 NOTE — Telephone Encounter (Signed)
Medication samples have been provided to the patient.  Drug name: eliquis 31m  Qty: 3 boxes  LOT: [id number] Exp.Date: 11/2021  Samples left at front desk for patient pick-up. Patient notified.  ESheral ApleyM 10:40 AM 10/26/2019

## 2019-11-03 NOTE — Progress Notes (Signed)
Remote pacemaker transmission.   

## 2019-11-11 ENCOUNTER — Telehealth: Payer: Self-pay | Admitting: Internal Medicine

## 2019-11-11 NOTE — Telephone Encounter (Signed)
Spoke with the pt and notified that we do not have a list for the covid vacccine

## 2019-11-15 ENCOUNTER — Telehealth: Payer: Self-pay | Admitting: Cardiovascular Disease

## 2019-11-15 NOTE — Telephone Encounter (Signed)
Pt given #28 samples of Eliquis 5 mg per her request.

## 2019-11-15 NOTE — Telephone Encounter (Signed)
New message    Patient calling the office for samples of medication:   1.  What medication and dosage are you requesting samples for? Eliquis  2.  Are you currently out of this medication? yes

## 2019-11-22 ENCOUNTER — Telehealth: Payer: Self-pay | Admitting: Cardiovascular Disease

## 2019-11-22 NOTE — Telephone Encounter (Signed)
Patient calling the office for samples of medication:   1.  What medication and dosage are you requesting samples for?  apixaban (ELIQUIS) 5 MG TABS tablet  2.  Are you currently out of this medication? Only has enough to last through 11/24/19

## 2019-11-22 NOTE — Telephone Encounter (Signed)
Pt updated that samples are available for pick up at the front desk.   Elquis 5 mg Qty: 2 boxes Lot # [id number] Exp: 12/22

## 2019-11-24 ENCOUNTER — Other Ambulatory Visit: Payer: Self-pay

## 2019-11-24 ENCOUNTER — Ambulatory Visit (INDEPENDENT_AMBULATORY_CARE_PROVIDER_SITE_OTHER): Payer: Medicare Other | Admitting: Cardiovascular Disease

## 2019-11-24 VITALS — BP 110/60 | HR 88 | Temp 95.7°F | Ht 66.0 in | Wt 263.0 lb

## 2019-11-24 DIAGNOSIS — I2510 Atherosclerotic heart disease of native coronary artery without angina pectoris: Secondary | ICD-10-CM | POA: Diagnosis not present

## 2019-11-24 DIAGNOSIS — I480 Paroxysmal atrial fibrillation: Secondary | ICD-10-CM | POA: Diagnosis not present

## 2019-11-24 DIAGNOSIS — R0609 Other forms of dyspnea: Secondary | ICD-10-CM

## 2019-11-24 DIAGNOSIS — I495 Sick sinus syndrome: Secondary | ICD-10-CM | POA: Diagnosis not present

## 2019-11-24 DIAGNOSIS — R0600 Dyspnea, unspecified: Secondary | ICD-10-CM | POA: Diagnosis not present

## 2019-11-24 DIAGNOSIS — G4733 Obstructive sleep apnea (adult) (pediatric): Secondary | ICD-10-CM

## 2019-11-24 DIAGNOSIS — Z7901 Long term (current) use of anticoagulants: Secondary | ICD-10-CM

## 2019-11-24 DIAGNOSIS — Z950 Presence of cardiac pacemaker: Secondary | ICD-10-CM | POA: Diagnosis not present

## 2019-11-24 NOTE — Patient Instructions (Signed)
Medication Instructions:  No changes *If you need a refill on your cardiac medications before your next appointment, please call your pharmacy*  Lab Work: None needed If you have labs (blood work) drawn today and your tests are completely normal, you will receive your results only by: Marland Kitchen MyChart Message (if you have MyChart) OR . A paper copy in the mail If you have any lab test that is abnormal or we need to change your treatment, we will call you to review the results.  Testing/Procedures: None ordered  Follow-Up: At [facility], you and your health needs are our priority.  As part of our continuing mission to provide you with exceptional heart care, we have created designated Provider Care Teams.  These Care Teams include your primary Cardiologist (physician) and Advanced Practice Providers (APPs -  Physician Assistants and Nurse Practitioners) who all work together to provide you with the care you need, when you need it.  Your next appointment:   12 month(s)  The format for your next appointment:   In Person  Provider:   Sanda Klein, MD

## 2019-11-24 NOTE — Progress Notes (Signed)
Cardiology Office Note    Date:  11/30/2019   ID:  JENISSE VULLO, DOB 1941-01-02, MRN [id number]  PCP:  Briscoe Deutscher, DO  Cardiologist:   Sanda Klein, MD   Chief Complaint  Patient presents with  . Shortness of Breath    History of Present Illness:  DIA DONATE is a 78 y.o. female for follow-up for tachycardia-bradycardia syndrome (sinus node dysfunction and paroxysmal atrial fibrillation, dual-chamber Medtronic pacemaker implanted 2015), possible chronic diastolic heart failure (echo has shown evidence of diastolic dysfunction, but she has never had clinical heart failure to date).  Complains of lack of energy and worsening dyspnea on exertion, now NYHA functional class III.  She had a fall and has some pain in her left knee.  She did not have any head injury and she does not report any recent serious falls or head injuries or bleeding problems.  She is compliant with anticoagulation with Eliquis.  The patient specifically denies any chest pain at rest or with exertion, orthopnea, paroxysmal nocturnal dyspnea, syncope, palpitations, focal neurological deficits, intermittent claudication, lower extremity edema, unexplained weight gain, cough, hemoptysis or wheezing.  Pacemaker interrogation shows a very low burden of atrial fibrillation since her cardioversion in 2019.  The overall prevalence of atrial fibrillation is less than 0.3%, with isolated paroxysmal events lasting for 4-10 hours occurring only 4 times in the last 12 months.  Otherwise she has 70% atrial pacing and she does not require ventricular pacing.  Battery longevity is estimated at about 5 years.  Atrial and ventricular lead parameters are all excellent.  During the episodes of atrial fibrillation ventricular rate control is good, she does have occasional brief episodes of atrial tachycardia with faster rates, but these are always very brief lasting just a few seconds.  It is interesting to note that her need for  atrial pacing has decreased from about 100% between December 2019 on May 2020, versus the last 6 months.  Also of note she is to have a distinct gradient between daytime average heart rates and nighttime heart rates which has disappeared over the last month or so.   She has evidence of atherosclerosis of the aorta. She does not have angina pectoris. Nuclear stress test in January 2018 was normal/low risk. Her echocardiogram in April 2015 showed evidence of diastolic dysfunction without elevated filling pressures. Similar findings on echo in September 2017 with EF 50-55% and grade 1 diastolic dysfunction without elevated filling pressures. She has not had clinically evident congestive heart failure in the past. 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 She has a long-standing history of obstructive sleep apnea but uses CPAP. 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  She has a lifelong struggle with obesity and has prediabetes.  She is enrolled in Dr. Migdalia Dk bariatric clinic  Past Medical History:  Diagnosis Date  . Alpha-1-antitrypsin deficiency carrier   . Arthritis    "all over"  . Atherosclerosis of aorta ([place])   . Atrial fibrillation ([place])   . Back pain   . Complication of anesthesia    "I woke up during  2 different procedures" (10/04/2014)  . Diastolic dysfunction   . Frequent UTI   . GERD (gastroesophageal reflux disease)   . Hypertension   . Hypertriglyceridemia   . Insulin resistance   . Long term current use of anticoagulant   .  Multiple lung nodules   . Muscular deconditioning   . MVP (mitral valve prolapse)   . Obesity   . OSA on CPAP   . Osteoarthritis   . Osteopenia    unspecified location  . Pacemaker   . PAF (paroxysmal atrial fibrillation) ([place])   . Positive ANA (antinuclear antibody)   . Presbyesophagus   . Presence of permanent cardiac pacemaker   . Primary osteoarthritis of both hands    neck and spine and hips  . Pulmonary fibrosis ([place])    followed by pulmonolgy  . PVC's (premature ventricular contractions)   . Shortness of breath   . 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 Sleep apnea   . Stroke [facility]) 01/2014   denies deficits on 10/04/2014  . Tachy-brady syndrome ([place]) 10/2014  . TIA (transient ischemic attack)   . Vitamin D deficiency     Past Surgical History:  Procedure Laterality Date  . CARDIOVERSION N/A 09/02/2017   Procedure: CARDIOVERSION;  Surgeon: Sanda Klein, MD;  Location: [facility];  Service: Cardiovascular;  Laterality: N/A;  . COLONOSCOPY  2019  . ESOPHAGEAL DILATION    . EXCISION VAGINAL CYST     benign nodules  . INSERT / REPLACE / REMOVE PACEMAKER  10/04/2014   Medtronic Advisa model [id number] serial number [id number]  . LOOP RECORDER EXPLANT N/A 10/04/2014   Procedure: LOOP RECORDER EXPLANT;  Surgeon: Sanda Klein, MD;  Location: [facility];  Service: Cardiovascular;  Laterality: N/A;  . LOOP RECORDER IMPLANT N/A 04/19/2014   Procedure: LOOP RECORDER IMPLANT;  Surgeon: Sanda Klein, MD;  Location: [facility];  Service: Cardiovascular;  Laterality: N/A;  . NM MYOCAR PERF WALL MOTION  12/18/2007   normal  . PERMANENT PACEMAKER INSERTION N/A 10/04/2014   Procedure: PERMANENT PACEMAKER INSERTION;  Surgeon: Sanda Klein, MD;  Location: [facility];  Service: Cardiovascular;  Laterality: N/A;  . TUBAL LIGATION    . US ECHOCARDIOGRAPHY  05/15/2010   LA mildly dilated,mild mitral annular ca+, AOV mildly sclerotic, mild  asymmetric LVH    Current Medications: Outpatient Medications Prior to Visit  Medication Sig Dispense Refill  . apixaban (ELIQUIS) 5 MG TABS tablet Take 1 tablet (5 mg total) by mouth 2 (two) times daily. 180 tablet 1  . B Complex-C (SUPER B COMPLEX PO) Take 1 tablet by mouth daily.    . Biotin 5000 MCG CAPS Take 5,000 mcg by mouth daily.     . Calcium Carbonate-Vit D-Min (CALCIUM 1200 PO) Take 1,200 mg by mouth daily.     . cholecalciferol (VITAMIN D) 1000 UNITS tablet Take 6,000 Units by mouth daily.     Marland Kitchen Co-Enzyme Q10 100 MG CAPS Take 100 mg by mouth daily.     . Flaxseed, Linseed, (FLAX SEED OIL PO) Take 1,400 mg by mouth daily.    . fluticasone (FLONASE) 50 MCG/ACT nasal spray Place 2 sprays into both nostrils daily. 16 g 1  . folic acid (FOLVITE) 546 MCG tablet Take 400 mcg by mouth daily.    . furosemide (LASIX) 20 MG tablet Take 1 tablet (20 mg total) by mouth daily. 90 tablet 1  . glucosamine-chondroitin 500-400 MG tablet Take  1 tablet by mouth daily.     Marland Kitchen loratadine (KLS ALLERCLEAR) 10 MG tablet Take 10 mg by mouth daily.    Marland Kitchen MAGNESIUM CITRATE PO Take 400 mg by mouth daily. 1 tab daily    . nitroGLYCERIN (NITROSTAT) 0.4 MG SL tablet Place 0.4 mg under the tongue every 5 (five) minutes as needed for chest pain.    . Omega-3 Fatty Acids (FISH OIL) 1000 MG CPDR Take 1,000 mg by mouth 2 (two) times daily.    Marland Kitchen omeprazole (PRILOSEC) 20 MG capsule Take 40 mg by mouth daily.     Marland Kitchen Resveratrol 250 MG CAPS Take 250 mg by mouth daily.    . sertraline (ZOLOFT) 100 MG tablet Take 1 tablet (100 mg total) by mouth daily. 90 tablet 0  . TURMERIC PO Take 1,000 mg by mouth daily.    . diazepam (VALIUM) 5 MG tablet Take 1 tablet po daily (Patient taking differently: as needed. Take 1 tablet po daily) 30 tablet 1  . OVER THE COUNTER MEDICATION CBD oil     No facility-administered medications prior to visit.     Allergies:   Ancef [cefazolin], Pseudoephedrine hcl, Ergotamine, Pentobarbital,  and Caffeine   Social History   Socioeconomic History  . Marital status: Divorced    Spouse name: Not on file  . Number of children: 3  . Years of education: college  . Highest education level: Not on file  Occupational History  . Occupation: Retired Engineer, site  Tobacco Use  . Smoking status: Former Smoker    Packs/day: 2.00    Years: 18.00    Pack years: 36.00    Types: Cigarettes    Quit date: 07/18/1970    Years since quitting: 49.4  . Smokeless tobacco: Never Used  . Tobacco comment: "quit smoking in 1971"  Substance and Sexual Activity  . Alcohol use: Yes    Alcohol/week: 0.0 standard drinks    Comment: 10/04/2014 "might have a drink a couple times/yr"  . Drug use: No  . Sexual activity: Never  Other Topics Concern  . Not on file  Social History Narrative   Lives at the [facility]    Patient is right handed.   Patient has a college degree.   Patient is retired.      Social History      Diet? Awful sweets are a terrible addiction       Do you drink/eat things with caffeine? some      Marital status?          divorced                          What year were you married? 1960      Do you live in a house, apartment, assisted living, condo, trailer, etc.? [place]       Is it one or more stories? 1st level      How many persons live in your home? 1      Do you have any pets in your home? (please list) no       Highest level of education completed? College graduate      Current or past profession: realtor       Do you exercise?             Not much  Type & how often? Barely any  - bed exercise for injured knee      Advanced Directives      Do you have a living will? yes      Do you have a DNR form?                                  If not, do you want to discuss one?yes      Do you have signed POA/HPOA for forms? yes      Functional Status      Do you have difficulty bathing or dressing yourself? No       Do you  have difficulty preparing food or eating? No       Do you have difficulty managing your medications? No       Do you have difficulty managing your finances? No       Do you have difficulty affording your medications? No  Except eloquist after donut hole - so far I have been able to get samples from cardiologist      Social Determinants of Health   Financial Resource Strain:   . Difficulty of Paying Living Expenses: Not on file  Food Insecurity:   . Worried About Charity fundraiser in the Last Year: Not on file  . Ran Out of Food in the Last Year: Not on file  Transportation Needs:   . Lack of Transportation (Medical): Not on file  . Lack of Transportation (Non-Medical): Not on file  Physical Activity:   . Days of Exercise per Week: Not on file  . Minutes of Exercise per Session: Not on file  Stress:   . Feeling of Stress : Not on file  Social Connections:   . Frequency of Communication with Friends and Family: Not on file  . Frequency of Social Gatherings with Friends and Family: Not on file  . Attends Religious Services: Not on file  . Active Member of Clubs or Organizations: Not on file  . Attends Archivist Meetings: Not on file  . Marital Status: Not on file     Family History:  The patient's family history includes Alcoholism in her brother; Alpha-1 antitrypsin deficiency in her daughter; Arthritis in her son; Breast cancer in her daughter; Diabetes in her son; Healthy in her brother, daughter, and sister; Heart attack in her mother; Heart failure in her father; Prostate cancer in her brother, brother, and brother; Stroke in her brother, brother, and father; Sudden death in her mother.   ROS:   Please see the history of present illness.    ROS All other systems are reviewed and are negative.    PHYSICAL EXAM:   VS:  BP 110/60   Pulse 88   Temp (!) 95.7 F (35.4 C)   Ht _0  (1.676 m)   Wt 263 lb (119.3 kg)   LMP  (LMP Unknown)   SpO2 97%   BMI 42.45  kg/m       General: Alert, oriented x3, no distress, morbidly obese, healthy left subclavian pacemaker site Head: no evidence of trauma, PERRL, EOMI, no exophtalmos or lid lag, no myxedema, no xanthelasma; normal ears, nose and oropharynx Neck: normal jugular venous pulsations and no hepatojugular reflux; brisk carotid pulses without delay and no carotid bruits Chest: clear to auscultation, no signs of consolidation by percussion or palpation, normal fremitus, symmetrical and full respiratory excursions  Cardiovascular: normal position and quality of the apical impulse, regular rhythm, normal first and second heart sounds, no murmurs, rubs or gallops Abdomen: no tenderness or distention, no masses by palpation, no abnormal pulsatility or arterial bruits, normal bowel sounds, no hepatosplenomegaly Extremities: no clubbing, cyanosis or edema; 2+ radial, ulnar and brachial pulses bilaterally; 2+ right femoral, posterior tibial and dorsalis pedis pulses; 2+ left femoral, posterior tibial and dorsalis pedis pulses; no subclavian or femoral bruits Neurological: grossly nonfocal Psych: Normal mood and affect   Wt Readings from Last 3 Encounters:  11/24/19 263 lb (119.3 kg)  10/14/19 261 lb 3.2 oz (118.5 kg)  09/22/19 264 lb (119.7 kg)      Studies/Labs Reviewed:   ECHO 05/18/2019   1. The left ventricle has low normal systolic function, with an ejection fraction of 50-55%. The cavity size was normal. Left ventricular diastolic function could not be evaluated.  2. The right ventricle has normal systolic function. The cavity was normal.  3. The mitral valve is grossly normal.  4. The tricuspid valve is grossly normal.  5. The aortic valve is tricuspid. Mild thickening of the aortic valve. No stenosis of the aortic valve.  6. Normal LV systolic function; no significant valvular heart disease.  EKG:  EKG is not ordered today.  Most recent tracing from 07/14/2019 shows atrial paced, ventricular  sensed rhythm and is otherwise normal.  QTc 406 ms  Recent Labs: 07/21/2019: ALT 14; BUN 26; Creatinine, Ser 0.98; Hemoglobin 14.1; Platelets 275; Potassium 4.4; Pro B Natriuretic peptide (BNP) 81.0; Sodium 138; TSH 2.790   Lipid Panel    Component Value Date/Time   CHOL 176 06/03/2018 1022   TRIG 113 06/03/2018 1022   HDL 53 06/03/2018 1022   CHOLHDL 3 01/15/2017 1417   VLDL 33.6 01/15/2017 1417   LDLCALC 100 (H) 06/03/2018 1022   LDLDIRECT 81.0 07/15/2016 1419    ASSESSMENT:    1. PAF (paroxysmal atrial fibrillation) ([place])   2. SSS (sick sinus syndrome) ([place])   3. Pacemaker   4. Dyspnea on exertion   5. Long term current use of anticoagulant   6. Morbid obesity ([place])   7. OSA (obstructive sleep apnea)      PLAN:  In order of problems listed above:  1. AFib: The burden of arrhythmia is low and atrial fibrillation cannot be incriminated as a cause for her dyspnea.  CHADSVasc 5 (Age 68, gender, TIA). Antiarrhythmics are not indicated.  Rate control is excellent and will probably remain satisfactory even without the beta-blocker.  We stopped her beta-blocker due to side effects last year, but this has not been associated with a significant increase in arrhythmia burden or problems with rate control. 2. SSS: Still requires 70% atrial pacing without the beta-blocker, heart rate histogram distribution is adequate. 3. PPM: Device function, continue remote downloads every 3 months. 4. Dyspnea: Although diastolic dysfunction was described on her echo, she does not have any clinical findings to suggest volume overload and her weight is unchanged.  Her dyspnea has always been felt to be multifactorial, related to obesity, lung fibrosis, COPD. BNP normal in 2018. Echo 2017 with normal EF, mild diastolic dysfunction without evidence of elevated filling pressures.  Echo performed in June 2020 showed no findings, but diastolic function could not be adequately assessed. 5. Anticoagulation:  Well-tolerated without bleeding problems.   6. Obesity: She is repeatedly discouraged and depressed about her inability to lose we weight is unchanged. 7. OSA reports compliance with CPAP.  Medication Adjustments/Labs and Tests Ordered: Current medicines are reviewed at length with the patient today.  Concerns regarding medicines are outlined above.  Medication changes, Labs and Tests ordered today are listed in the Patient Instructions below. Patient Instructions  Medication Instructions:  No changes *If you need a refill on your cardiac medications before your next appointment, please call your pharmacy*  Lab Work: None needed If you have labs (blood work) drawn today and your tests are completely normal, you will receive your results only by: Marland Kitchen MyChart Message (if you have MyChart) OR . A paper copy in the mail If you have any lab test that is abnormal or we need to change your treatment, we will call you to review the results.  Testing/Procedures: None ordered  Follow-Up: At [facility], you and your health needs are our priority.  As part of our continuing mission to provide you with exceptional heart care, we have created designated Provider Care Teams.  These Care Teams include your primary Cardiologist (physician) and Advanced Practice Providers (APPs -  Physician Assistants and Nurse Practitioners) who all work together to provide you with the care you need, when you need it.  Your next appointment:   12 month(s)  The format for your next appointment:   In Person  Provider:   Sanda Klein, MD      Signed, Sanda Klein, MD  11/30/2019 2:56 PM    [address], [place], [place]  [address] Phone: [phone]; Fax: [phone]

## 2019-11-30 ENCOUNTER — Encounter: Payer: Self-pay | Admitting: Cardiovascular Disease

## 2019-12-06 ENCOUNTER — Telehealth: Payer: Self-pay | Admitting: Cardiovascular Disease

## 2019-12-06 DIAGNOSIS — Z23 Encounter for immunization: Secondary | ICD-10-CM | POA: Diagnosis not present

## 2019-12-06 NOTE — Telephone Encounter (Signed)
The patient called to say that her Eliquis would now be $570 for a three month supply. She does not qualify for assistance.  She has been made aware that the only option would be warfarin which she does not want to do.  She has also been made aware that we currently do not have any samples.

## 2019-12-06 NOTE — Telephone Encounter (Signed)
New Message     Pt c/o medication issue:  1. Name of Medication: apixaban (ELIQUIS) 5 MG TABS tablet  2. How are you currently taking this medication (dosage and times per day)? 2 x daily   3. Are you having a reaction (difficulty breathing--STAT)? No   4. What is your medication issue? Pt says this medication is expensive and she is needing something she can afford   Please call

## 2019-12-08 ENCOUNTER — Encounter: Payer: Self-pay | Admitting: Internal Medicine

## 2019-12-08 ENCOUNTER — Non-Acute Institutional Stay: Payer: Medicare Other | Admitting: Internal Medicine

## 2019-12-08 ENCOUNTER — Other Ambulatory Visit: Payer: Self-pay

## 2019-12-08 VITALS — BP 120/70 | HR 87 | Temp 97.7°F | Ht 66.0 in | Wt 265.0 lb

## 2019-12-08 DIAGNOSIS — G4733 Obstructive sleep apnea (adult) (pediatric): Secondary | ICD-10-CM

## 2019-12-08 DIAGNOSIS — J8410 Pulmonary fibrosis, unspecified: Secondary | ICD-10-CM

## 2019-12-08 DIAGNOSIS — E781 Pure hyperglyceridemia: Secondary | ICD-10-CM

## 2019-12-08 DIAGNOSIS — F329 Major depressive disorder, single episode, unspecified: Secondary | ICD-10-CM | POA: Diagnosis not present

## 2019-12-08 DIAGNOSIS — R7303 Prediabetes: Secondary | ICD-10-CM | POA: Diagnosis not present

## 2019-12-08 DIAGNOSIS — Z9989 Dependence on other enabling machines and devices: Secondary | ICD-10-CM | POA: Diagnosis not present

## 2019-12-08 DIAGNOSIS — M1990 Unspecified osteoarthritis, unspecified site: Secondary | ICD-10-CM

## 2019-12-08 DIAGNOSIS — I480 Paroxysmal atrial fibrillation: Secondary | ICD-10-CM

## 2019-12-08 NOTE — Progress Notes (Signed)
Location:  [place] of Service:  Clinic (12)  Provider:   Code Status:  Goals of Care:  Advanced Directives 09/23/2019  Does Patient Have a Medical Advance Directive? Yes  Type of Advance Directive Living will;Healthcare Power of Attorney  Does patient want to make changes to medical advance directive? No - Patient declined  Copy of [place] in Chart? Yes - validated most recent copy scanned in chart (See row information)     Chief Complaint  Patient presents with  . New Admit To SNF    Patient here in clinic to establish care. She recently recieved her covid 19 vaccine. She would like to discuss her breathing and pain.     HPI: Patient is a 79 y.o. female seen today for medical management of chronic diseases. Came for the first Visit [place]. Was having difficulty managing house by Herself. Is independent. Uses Cane to walk and Conservator, museum/gallery for [employer]. 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  ILD Follows with Pulmonology. Has been stable. Not Progressive. Not any treatment  LE edema Not taking Lasix anymore Prediabetes Has been stable  Arthritis with Pain of Left Knee with Obesity BMI 40 I following with Ortho Wants to know if she can use Hydrocodone Prn as prescribed by her Previous PCP Positive ANA Was seen by Dr Estanislado Pandy Who think there is no active disease Depression with anxiety On  Zoloft Also PRN valium OSA ON CPAP  Past Medical History:  Diagnosis Date  . Alpha-1-antitrypsin deficiency carrier   . Arthritis    "all over"  . Atherosclerosis of aorta ([place])   . Atrial fibrillation ([place])   . Back pain   . Complication of anesthesia    "I woke up during 2 different procedures" (10/04/2014)  . Diastolic dysfunction   . Frequent UTI   . GERD (gastroesophageal reflux disease)   . Hypertension   .  Hypertriglyceridemia   . Insulin resistance   . Long term current use of anticoagulant   . Multiple lung nodules   . Muscular deconditioning   . MVP (mitral valve prolapse)   . Obesity   . OSA on CPAP   . Osteoarthritis   . Osteopenia    unspecified location  . Pacemaker   . PAF (paroxysmal atrial fibrillation) ([place])   . Positive ANA (antinuclear antibody)   . Presbyesophagus   . Presence of permanent cardiac pacemaker   . Primary osteoarthritis of both hands    neck and spine and hips  . Pulmonary fibrosis ([place])    followed by pulmonolgy  . PVC's (premature ventricular contractions)   . Shortness of breath   . 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 Sleep apnea   . Stroke [facility]) 01/2014   denies deficits on 10/04/2014  . Tachy-brady syndrome ([place]) 10/2014  . TIA (transient ischemic attack)   . Vitamin D deficiency     Past Surgical History:  Procedure Laterality Date  . CARDIOVERSION N/A 09/02/2017   Procedure: CARDIOVERSION;  Surgeon: Sanda Klein, MD;  Location: [facility];  Service: Cardiovascular;  Laterality: N/A;  . COLONOSCOPY  2019  . ESOPHAGEAL DILATION    . EXCISION VAGINAL CYST     benign nodules  . INSERT / REPLACE / REMOVE PACEMAKER  10/04/2014  Medtronic Advisa model [id number] serial number [id number]  . LOOP RECORDER EXPLANT N/A 10/04/2014   Procedure: LOOP RECORDER EXPLANT;  Surgeon: Sanda Klein, MD;  Location: [facility] CATH LAB;  Service: Cardiovascular;  Laterality: N/A;  . LOOP RECORDER IMPLANT N/A 04/19/2014   Procedure: LOOP RECORDER IMPLANT;  Surgeon: Sanda Klein, MD;  Location: [facility] CATH LAB;  Service: Cardiovascular;  Laterality: N/A;  . NM MYOCAR PERF WALL MOTION  12/18/2007   normal  . PERMANENT PACEMAKER INSERTION N/A 10/04/2014   Procedure: PERMANENT PACEMAKER INSERTION;  Surgeon: Sanda Klein, MD;  Location: [facility] CATH LAB;  Service: Cardiovascular;  Laterality: N/A;  . TUBAL LIGATION    . US  ECHOCARDIOGRAPHY  05/15/2010   LA mildly dilated,mild mitral annular ca+, AOV mildly sclerotic, mild asymmetric LVH    Allergies  Allergen Reactions  . Ancef [Cefazolin] Hives and Itching  . Pseudoephedrine Hcl     Other reaction(s): palpitations (moderate)  . Ergotamine   . Pentobarbital   . Caffeine Palpitations    Outpatient Encounter Medications as of 12/08/2019  Medication Sig  . apixaban (ELIQUIS) 5 MG TABS tablet Take 1 tablet (5 mg total) by mouth 2 (two) times daily.  . B Complex-C (SUPER B COMPLEX PO) Take 1 tablet by mouth daily.  . Biotin 5000 MCG CAPS Take 5,000 mcg by mouth daily.   . Calcium Carbonate-Vit D-Min (CALCIUM 1200 PO) Take 1,200 mg by mouth daily.   . cholecalciferol (VITAMIN D) 1000 UNITS tablet Take 6,000 Units by mouth daily.   Marland Kitchen Co-Enzyme Q10 100 MG CAPS Take 100 mg by mouth daily.   . diazepam (VALIUM) 5 MG tablet Take 5 mg by mouth as needed for anxiety.  . Flaxseed, Linseed, (FLAX SEED OIL PO) Take 1,400 mg by mouth daily.  . fluticasone (FLONASE) 50 MCG/ACT nasal spray Place 2 sprays into both nostrils as needed for allergies or rhinitis.  . folic acid (FOLVITE) 102 MCG tablet Take 400 mcg by mouth daily.  Marland Kitchen glucosamine-chondroitin 500-400 MG tablet Take 1 tablet by mouth 2 (two) times daily.   Marland Kitchen loratadine (KLS ALLERCLEAR) 10 MG tablet Take 10 mg by mouth daily.  Marland Kitchen MAGNESIUM CITRATE PO Take 400 mg by mouth daily. 1 tab daily  . nitroGLYCERIN (NITROSTAT) 0.4 MG SL tablet Place 0.4 mg under the tongue every 5 (five) minutes as needed for chest pain.  . Omega-3 Fatty Acids (FISH OIL) 1000 MG CPDR Take 1,000 mg by mouth daily.   Marland Kitchen omeprazole (PRILOSEC) 20 MG capsule Take 40 mg by mouth daily.   Marland Kitchen Resveratrol 250 MG CAPS Take 250 mg by mouth daily.  . sertraline (ZOLOFT) 100 MG tablet Take 1 tablet (100 mg total) by mouth daily.  . TURMERIC PO Take 1,000 mg by mouth daily.  . [DISCONTINUED] furosemide (LASIX) 20 MG tablet Take 1 tablet (20 mg total) by  mouth daily.   No facility-administered encounter medications on file as of 12/08/2019.    Review of Systems:  Review of Systems  Constitutional: Negative.   HENT: Negative.   Respiratory: Positive for shortness of breath.   Cardiovascular: Positive for leg swelling.  Gastrointestinal: Negative.   Genitourinary: Negative.   Musculoskeletal: Positive for arthralgias and gait problem.  Neurological: Positive for weakness.  Psychiatric/Behavioral: Positive for decreased concentration and dysphoric mood. The patient is nervous/anxious.     Health Maintenance  Topic Date Due  . TETANUS/TDAP  10/21/1960  . MAMMOGRAM  02/10/2020  . INFLUENZA VACCINE  Completed  . DEXA SCAN  Completed  . PNA vac Low Risk Adult  Completed    Physical Exam: Vitals:   12/08/19 1609  BP: 120/70  Pulse: 87  Temp: 97.7 F (36.5 C)  SpO2: 99%  Weight: 265 lb (120.2 kg)  Height: _0  (1.676 m)   Body mass index is 42.77 kg/m. Physical Exam Vitals reviewed.  Constitutional:      Appearance: She is obese.  HENT:     Head: Normocephalic.     Nose: Nose normal.     Mouth/Throat:     Mouth: Mucous membranes are moist.     Pharynx: Oropharynx is clear.  Eyes:     Pupils: Pupils are equal, round, and reactive to light.  Cardiovascular:     Rate and Rhythm: Normal rate and regular rhythm.     Pulses: Normal pulses.  Pulmonary:     Effort: Pulmonary effort is normal.     Breath sounds: Normal breath sounds.  Abdominal:     General: Abdomen is flat. Bowel sounds are normal.     Palpations: Abdomen is soft.  Musculoskeletal:        General: Swelling present.     Cervical back: Neck supple.  Skin:    General: Skin is warm.  Neurological:     General: No focal deficit present.     Mental Status: She is alert and oriented to person, place, and time.     Comments: Gait was stable  Psychiatric:        Mood and Affect: Mood normal.        Thought Content: Thought content normal.     Labs  reviewed: Basic Metabolic Panel: Recent Labs    07/21/19 1444  NA 138  K 4.4  CL 99  CO2 23  GLUCOSE 101*  BUN 26  CREATININE 0.98  CALCIUM 9.4  TSH 2.790   Liver Function Tests: Recent Labs    07/21/19 1444  AST 22  ALT 14  ALKPHOS 80  BILITOT 0.3  PROT 6.5  ALBUMIN 4.1   No results for input(s): LIPASE, AMYLASE in the last 8760 hours. No results for input(s): AMMONIA in the last 8760 hours. CBC: Recent Labs    07/21/19 1444  WBC 9.3  HGB 14.1  HCT 41.9  MCV 85  PLT 275   Lipid Panel: No results for input(s): CHOL, HDL, LDLCALC, TRIG, CHOLHDL, LDLDIRECT in the last 8760 hours. Lab Results  Component Value Date   HGBA1C 5.8 (H) 09/22/2018    Procedures since last visit: No results found.  Assessment/Plan PAF (paroxysmal atrial fibrillation) (HCC) On Eliquis Stable Follows with Cardiology  Pulmonary fibrosis ([place]), followed by Pulmonology Not Progressive Continue to be stable  OSA on CPAP, compliant Doing well   depression Continue Zoloft Wants to know if I can give her Prn Valium Will discuss in Net visit  Arthritis Has Hydrocodone PRN Told her that we will evaluate her use before reprscribing    Labs/tests ordered:  * No order type specified * Next appt:  03/13/2020   Total time spent in this patient care encounter was  _40  minutes; greater than 50% of the visit spent counseling patient and staff, reviewing records , Labs and coordinating care for problems addressed at this encounter.

## 2019-12-12 DIAGNOSIS — M1990 Unspecified osteoarthritis, unspecified site: Secondary | ICD-10-CM | POA: Insufficient documentation

## 2019-12-13 NOTE — Telephone Encounter (Signed)
Pt notified that we do not have any samples. She still has medication she will call again later

## 2019-12-13 NOTE — Telephone Encounter (Signed)
Patient calling the office for samples of medication:   1.  What medication and dosage are you requesting samples for?   apixaban (ELIQUIS) 5 MG TABS tablet   2.  Are you currently out of this medication? No  Patient is calling back to see if we have gotten any samples in.

## 2019-12-14 ENCOUNTER — Other Ambulatory Visit: Payer: Self-pay | Admitting: *Deleted

## 2019-12-14 DIAGNOSIS — F331 Major depressive disorder, recurrent, moderate: Secondary | ICD-10-CM

## 2019-12-14 MED ORDER — SERTRALINE HCL 100 MG PO TABS: 100.0000 mg | ORAL_TABLET | Freq: Every day | ORAL | 0 refills | Status: DC

## 2019-12-21 NOTE — Telephone Encounter (Signed)
Returned call to patient, no answer.  Left VM (ok per DPR) to advise of recommendations.  Advised to call once MRI is scheduled or call with further questions or concerns.

## 2019-12-21 NOTE — Telephone Encounter (Signed)
Returned call to patient, advised no samples available at this time.  Patient states at last OV she discussed having an MRI of her knee and states Dr. Sallyanne Kuster mentioned they would have to do something with her PPM prior to this.  She states her knee continues to hurt and she would like to go ahead and get this fixed.   Patient is wondering what would need to be done to her PPM prior?  Advised would route to MD for review.  Patient verbalized understanding.

## 2019-12-21 NOTE — Telephone Encounter (Signed)
She just needs to give [place] the location, time and date of the scan so we can notify the pacemaker rep to temporarily reprogram the device just before her scan.

## 2019-12-21 NOTE — Telephone Encounter (Signed)
Patient calling the office for samples of medication:   1.  What medication and dosage are you requesting samples for?   apixaban (ELIQUIS) 5 MG TABS tablet   2.  Are you currently out of this medication? No   Patient is calling back to see if we have gotten any samples of Eliquis yet.

## 2019-12-28 ENCOUNTER — Telehealth: Payer: Self-pay | Admitting: Cardiovascular Disease

## 2019-12-28 ENCOUNTER — Other Ambulatory Visit: Payer: Self-pay | Admitting: Cardiovascular Disease

## 2019-12-28 NOTE — Telephone Encounter (Signed)
New Message   Patient calling the office for samples of medication:   1.  What medication and dosage are you requesting samples for? apixaban (ELIQUIS) 5 MG TABS tablet     2.  Are you currently out of this medication? 3 days remaining

## 2019-12-28 NOTE — Telephone Encounter (Signed)
Medication samples have been provided to the patient.  Drug name: Eliquis  Qty: 3 boxes  LOT: [id number]  Exp.Date: 1/23  Samples left at front desk for patient pick-up. Patient notified.

## 2019-12-29 DIAGNOSIS — Z03818 Encounter for observation for suspected exposure to other biological agents ruled out: Secondary | ICD-10-CM | POA: Diagnosis not present

## 2020-01-03 DIAGNOSIS — Z23 Encounter for immunization: Secondary | ICD-10-CM | POA: Diagnosis not present

## 2020-01-04 ENCOUNTER — Telehealth: Payer: Self-pay

## 2020-01-04 NOTE — Telephone Encounter (Signed)
The pt states her pulse was high. She sent a transmission with my help. I let her talk with device nurse Raquel Sarna, Rn.

## 2020-01-04 NOTE — Telephone Encounter (Signed)
Transmission reviewed. Presenting rhythm AP/VS @ 72bpm w/ bigeminal PACs. 3 AT/AF episodes since 11/24/19 (0.8% burden), most recent today beginning at 07:59, duration 3hr 13mn. 1 NSVT--EGM shows 1:1 AT, 3sec duration.   Spoke with patient. She denies any symptoms at this time, reports that this morning she checked her HR with a pulse ox and it was elevated. She received her COVID vaccine yesterday and is concerned this is related. Explained that she did have one episode of rate-controlled AF this AM, but that she is no longer in AF. AF burden remains low, pt is anticoagulated with Eliquis. Pt is aware and reports this is very reassuring. She agrees to call in the future if any additional concerns or symptomatic episodes. No further questions at this time.

## 2020-01-12 ENCOUNTER — Ambulatory Visit (INDEPENDENT_AMBULATORY_CARE_PROVIDER_SITE_OTHER): Payer: Medicare Other | Admitting: *Deleted

## 2020-01-12 DIAGNOSIS — I480 Paroxysmal atrial fibrillation: Secondary | ICD-10-CM

## 2020-01-13 DIAGNOSIS — H52223 Regular astigmatism, bilateral: Secondary | ICD-10-CM | POA: Diagnosis not present

## 2020-01-13 DIAGNOSIS — H25813 Combined forms of age-related cataract, bilateral: Secondary | ICD-10-CM | POA: Diagnosis not present

## 2020-01-13 DIAGNOSIS — H5202 Hypermetropia, left eye: Secondary | ICD-10-CM | POA: Diagnosis not present

## 2020-01-13 DIAGNOSIS — H524 Presbyopia: Secondary | ICD-10-CM | POA: Diagnosis not present

## 2020-01-13 LAB — CUP PACEART REMOTE DEVICE CHECK
Battery Remaining Longevity: 66 mo
Battery Voltage: 3 V
Brady Statistic AP VP Percent: 0.06 %
Brady Statistic AP VS Percent: 68.69 %
Brady Statistic AS VP Percent: 0.03 %
Brady Statistic AS VS Percent: 31.23 %
Brady Statistic RA Percent Paced: 64.49 %
Brady Statistic RV Percent Paced: 0.08 %
Date Time Interrogation Session: 20210211120936
Implantable Lead Implant Date: 20151103
Implantable Lead Implant Date: 20151103
Implantable Lead Location: 753859
Implantable Lead Location: 753860
Implantable Lead Model: 5076
Implantable Lead Model: 5076
Implantable Pulse Generator Implant Date: 20151103
Lead Channel Impedance Value: 399 Ohm
Lead Channel Impedance Value: 456 Ohm
Lead Channel Impedance Value: 456 Ohm
Lead Channel Impedance Value: 494 Ohm
Lead Channel Pacing Threshold Amplitude: 0.5 V
Lead Channel Pacing Threshold Amplitude: 0.5 V
Lead Channel Pacing Threshold Pulse Width: 0.4 ms
Lead Channel Pacing Threshold Pulse Width: 0.4 ms
Lead Channel Sensing Intrinsic Amplitude: 5.5 mV
Lead Channel Sensing Intrinsic Amplitude: 5.5 mV
Lead Channel Sensing Intrinsic Amplitude: 6.125 mV
Lead Channel Sensing Intrinsic Amplitude: 6.125 mV
Lead Channel Setting Pacing Amplitude: 1.5 V
Lead Channel Setting Pacing Amplitude: 2 V
Lead Channel Setting Pacing Pulse Width: 0.4 ms
Lead Channel Setting Sensing Sensitivity: 2.8 mV

## 2020-01-13 NOTE — Progress Notes (Signed)
PPM Remote

## 2020-01-28 ENCOUNTER — Telehealth: Payer: Self-pay | Admitting: Cardiovascular Disease

## 2020-01-28 NOTE — Telephone Encounter (Signed)
Called patient to inform her that we didn't have any moe eliquis samples.

## 2020-01-28 NOTE — Telephone Encounter (Signed)
New Message   Patient calling the office for samples of medication:   1.  What medication and dosage are you requesting samples for? ELIQUIS 5 MG TABS tablet  2.  Are you currently out of this medication?  Pt is almost out

## 2020-02-07 ENCOUNTER — Other Ambulatory Visit: Payer: Self-pay | Admitting: Internal Medicine

## 2020-02-07 ENCOUNTER — Other Ambulatory Visit: Payer: Self-pay | Admitting: Family Medicine

## 2020-02-08 ENCOUNTER — Telehealth: Payer: Self-pay | Admitting: Cardiovascular Disease

## 2020-02-08 NOTE — Telephone Encounter (Signed)
Patient calling the office for samples of medication:   1.  What medication and dosage are you requesting samples for? ELIQUIS 5 MG TABS tablet  2.  Are you currently out of this medication? No. Patient states she will be out of medication in 2 days.

## 2020-02-08 NOTE — Telephone Encounter (Signed)
The patient has been made aware that we are currently out of samples. She will call back.

## 2020-02-10 ENCOUNTER — Telehealth: Payer: Self-pay | Admitting: Cardiovascular Disease

## 2020-02-10 NOTE — Telephone Encounter (Signed)
Pt aware samples left at front desk .Adonis Housekeeper

## 2020-02-10 NOTE — Telephone Encounter (Signed)
Patient calling the office for samples of medication:   1.  What medication and dosage are you requesting samples for? ELIQUIS 5 MG TABS tablet  2.  Are you currently out of this medication? no

## 2020-02-14 DIAGNOSIS — Z13820 Encounter for screening for osteoporosis: Secondary | ICD-10-CM | POA: Diagnosis not present

## 2020-02-14 DIAGNOSIS — Z1231 Encounter for screening mammogram for malignant neoplasm of breast: Secondary | ICD-10-CM | POA: Diagnosis not present

## 2020-02-14 DIAGNOSIS — Z780 Asymptomatic menopausal state: Secondary | ICD-10-CM | POA: Diagnosis not present

## 2020-02-14 LAB — HM DEXA SCAN: HM Dexa Scan: NORMAL

## 2020-02-14 LAB — HM MAMMOGRAPHY

## 2020-02-15 ENCOUNTER — Encounter: Payer: Self-pay | Admitting: *Deleted

## 2020-02-22 DIAGNOSIS — R928 Other abnormal and inconclusive findings on diagnostic imaging of breast: Secondary | ICD-10-CM | POA: Diagnosis not present

## 2020-03-13 ENCOUNTER — Other Ambulatory Visit: Payer: Self-pay

## 2020-03-13 DIAGNOSIS — R7303 Prediabetes: Secondary | ICD-10-CM

## 2020-03-13 DIAGNOSIS — E781 Pure hyperglyceridemia: Secondary | ICD-10-CM

## 2020-03-13 DIAGNOSIS — I480 Paroxysmal atrial fibrillation: Secondary | ICD-10-CM | POA: Diagnosis not present

## 2020-03-14 LAB — HEMOGLOBIN A1C
Hgb A1c MFr Bld: 5.8 % of total Hgb — ABNORMAL HIGH (ref <5.7)
Mean Plasma Glucose: 120 (calc)
eAG (mmol/L): 6.6 (calc)

## 2020-03-14 LAB — CBC WITH DIFFERENTIAL/PLATELET
Absolute Monocytes: 929 cells/uL (ref 200–950)
Basophils Absolute: 60 cells/uL (ref 0–200)
Basophils Relative: 0.7 %
Eosinophils Absolute: 301 cells/uL (ref 15–500)
Eosinophils Relative: 3.5 %
HCT: 45.5 % — ABNORMAL HIGH (ref 35.0–45.0)
Hemoglobin: 15.1 g/dL (ref 11.7–15.5)
Lymphs Abs: 2632 cells/uL (ref 850–3900)
MCH: 28.7 pg (ref 27.0–33.0)
MCHC: 33.2 g/dL (ref 32.0–36.0)
MCV: 86.5 fL (ref 80.0–100.0)
MPV: 10.8 fL (ref 7.5–12.5)
Monocytes Relative: 10.8 %
Neutro Abs: 4678 cells/uL (ref 1500–7800)
Neutrophils Relative %: 54.4 %
Platelets: 261 10*3/uL (ref 140–400)
RBC: 5.26 10*6/uL — ABNORMAL HIGH (ref 3.80–5.10)
RDW: 12.2 % (ref 11.0–15.0)
Total Lymphocyte: 30.6 %
WBC: 8.6 10*3/uL (ref 3.8–10.8)

## 2020-03-14 LAB — COMPLETE METABOLIC PANEL WITH GFR
AG Ratio: 1.5 (calc) (ref 1.0–2.5)
ALT: 13 U/L (ref 6–29)
AST: 19 U/L (ref 10–35)
Albumin: 4 g/dL (ref 3.6–5.1)
Alkaline phosphatase (APISO): 64 U/L (ref 37–153)
BUN/Creatinine Ratio: 24 (calc) — ABNORMAL HIGH (ref 6–22)
BUN: 24 mg/dL (ref 7–25)
CO2: 30 mmol/L (ref 20–32)
Calcium: 9.5 mg/dL (ref 8.6–10.4)
Chloride: 103 mmol/L (ref 98–110)
Creat: 0.99 mg/dL — ABNORMAL HIGH (ref 0.60–0.93)
GFR, Est African American: 63 mL/min/{1.73_m2} (ref >=60)
GFR, Est Non African American: 54 mL/min/{1.73_m2} — ABNORMAL LOW (ref >=60)
Globulin: 2.7 g/dL (calc) (ref 1.9–3.7)
Glucose, Bld: 121 mg/dL — ABNORMAL HIGH (ref 65–99)
Potassium: 4.3 mmol/L (ref 3.5–5.3)
Sodium: 140 mmol/L (ref 135–146)
Total Bilirubin: 0.4 mg/dL (ref 0.2–1.2)
Total Protein: 6.7 g/dL (ref 6.1–8.1)

## 2020-03-14 LAB — LIPID PANEL
Cholesterol: 203 mg/dL — ABNORMAL HIGH (ref <200)
HDL: 52 mg/dL (ref >=50)
LDL Cholesterol (Calc): 126 mg/dL (calc) — ABNORMAL HIGH
Non-HDL Cholesterol (Calc): 151 mg/dL (calc) — ABNORMAL HIGH (ref <130)
Total CHOL/HDL Ratio: 3.9 (calc) (ref <5.0)
Triglycerides: 141 mg/dL (ref <150)

## 2020-03-15 ENCOUNTER — Non-Acute Institutional Stay: Payer: Medicare Other | Admitting: Internal Medicine

## 2020-03-15 ENCOUNTER — Encounter: Payer: Self-pay | Admitting: Internal Medicine

## 2020-03-15 ENCOUNTER — Other Ambulatory Visit: Payer: Self-pay

## 2020-03-15 VITALS — BP 108/64 | HR 69 | Temp 98.0°F | Ht 66.0 in | Wt 265.6 lb

## 2020-03-15 DIAGNOSIS — J8410 Pulmonary fibrosis, unspecified: Secondary | ICD-10-CM | POA: Diagnosis not present

## 2020-03-15 DIAGNOSIS — G4733 Obstructive sleep apnea (adult) (pediatric): Secondary | ICD-10-CM

## 2020-03-15 DIAGNOSIS — I480 Paroxysmal atrial fibrillation: Secondary | ICD-10-CM

## 2020-03-15 DIAGNOSIS — E785 Hyperlipidemia, unspecified: Secondary | ICD-10-CM

## 2020-03-15 DIAGNOSIS — R300 Dysuria: Secondary | ICD-10-CM | POA: Diagnosis not present

## 2020-03-15 DIAGNOSIS — Z9989 Dependence on other enabling machines and devices: Secondary | ICD-10-CM | POA: Diagnosis not present

## 2020-03-15 DIAGNOSIS — M1990 Unspecified osteoarthritis, unspecified site: Secondary | ICD-10-CM | POA: Diagnosis not present

## 2020-03-15 DIAGNOSIS — R7303 Prediabetes: Secondary | ICD-10-CM

## 2020-03-15 DIAGNOSIS — F329 Major depressive disorder, single episode, unspecified: Secondary | ICD-10-CM | POA: Diagnosis not present

## 2020-03-15 NOTE — Progress Notes (Signed)
Location:  [place] of Service:  Clinic (12)  Provider:   Code Status:  Goals of Care:  Advanced Directives 09/23/2019  Does Patient Have a Medical Advance Directive? Yes  Type of Advance Directive Living will;Healthcare Power of Attorney  Does patient want to make changes to medical advance directive? No - Patient declined  Copy of [place] in Chart? Yes - validated most recent copy scanned in chart (See row information)     Chief Complaint  Patient presents with  . Medical Management of Chronic Issues    Patient returns to the clinic for follow up. She would like to discuss possibly havig a bladder infection. Patient     HPI: Patient is a 79 y.o. female seen today for medical management of chronic diseases.    [place]. Was having difficulty managing house by Herself. Is independent. 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 On Zoloft but still struggling OSA On CPAP Pain in Left Knee with Obesity BMI 40 Is going for follow up with Dr Maureen Ralphs Uses Motorized Chair for Long distance Positive ANA Was seen by Dr Estanislado Pandy Who think there is no active disease LE edema Not needing Lasix anymore  Past Medical History:  Diagnosis Date  . Alpha-1-antitrypsin deficiency carrier   . Arthritis    "all over"  . Atherosclerosis of aorta ([place])   . Atrial fibrillation ([place])   . Back pain   . Complication of anesthesia    "I woke up during 2 different procedures" (10/04/2014)  . Diastolic dysfunction   . Frequent UTI   . GERD (gastroesophageal reflux disease)   . Hypertension   . Hypertriglyceridemia   . Insulin resistance   . Long term current use of  anticoagulant   . Multiple lung nodules   . Muscular deconditioning   . MVP (mitral valve prolapse)   . Obesity   . OSA on CPAP   . Osteoarthritis   . Osteopenia    unspecified location  . Pacemaker   . PAF (paroxysmal atrial fibrillation) ([place])   . Positive ANA (antinuclear antibody)   . Presbyesophagus   . Presence of permanent cardiac pacemaker   . Primary osteoarthritis of both hands    neck and spine and hips  . Pulmonary fibrosis ([place])    followed by pulmonolgy  . PVC's (premature ventricular contractions)   . Shortness of breath   . 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 Sleep apnea   . Stroke [facility]) 01/2014   denies deficits on 10/04/2014  . Tachy-brady syndrome ([place]) 10/2014  . TIA (transient ischemic attack)   . Vitamin D deficiency     Past Surgical History:  Procedure Laterality Date  . CARDIOVERSION N/A 09/02/2017   Procedure: CARDIOVERSION;  Surgeon: Sanda Klein, MD;  Location: [facility];  Service: Cardiovascular;  Laterality: N/A;  . COLONOSCOPY  2019  . ESOPHAGEAL DILATION    . EXCISION VAGINAL CYST     benign nodules  . INSERT / REPLACE / REMOVE PACEMAKER  10/04/2014   Medtronic Advisa model [id number] serial number [id number]  . LOOP RECORDER EXPLANT N/A 10/04/2014  Procedure: LOOP RECORDER EXPLANT;  Surgeon: Sanda Klein, MD;  Location: [facility] CATH LAB;  Service: Cardiovascular;  Laterality: N/A;  . LOOP RECORDER IMPLANT N/A 04/19/2014   Procedure: LOOP RECORDER IMPLANT;  Surgeon: Sanda Klein, MD;  Location: [facility] CATH LAB;  Service: Cardiovascular;  Laterality: N/A;  . NM MYOCAR PERF WALL MOTION  12/18/2007   normal  . PERMANENT PACEMAKER INSERTION N/A 10/04/2014   Procedure: PERMANENT PACEMAKER INSERTION;  Surgeon: Sanda Klein, MD;  Location: [facility] CATH LAB;  Service: Cardiovascular;  Laterality: N/A;  . TUBAL LIGATION    . US ECHOCARDIOGRAPHY  05/15/2010   LA mildly dilated,mild mitral annular ca+, AOV  mildly sclerotic, mild asymmetric LVH    Allergies  Allergen Reactions  . Ancef [Cefazolin] Hives and Itching  . Pseudoephedrine Hcl     Other reaction(s): palpitations (moderate)  . Ergotamine   . Pentobarbital   . Caffeine Palpitations    Outpatient Encounter Medications as of 03/15/2020  Medication Sig  . B Complex-C (SUPER B COMPLEX PO) Take 1 tablet by mouth daily.  . Biotin 5000 MCG CAPS Take 5,000 mcg by mouth daily.   . Calcium Carbonate-Vit D-Min (CALCIUM 1200 PO) Take 1,200 mg by mouth daily.   . cholecalciferol (VITAMIN D) 1000 UNITS tablet Take 6,000 Units by mouth daily.   Marland Kitchen Co-Enzyme Q10 100 MG CAPS Take 100 mg by mouth daily.   . diazepam (VALIUM) 5 MG tablet TAKE 1 TABLET EACH DAY.  Marland Kitchen ELIQUIS 5 MG TABS tablet TAKE 1 TABLET BY MOUTH TWICE DAILY.  Marland Kitchen Flaxseed, Linseed, (FLAX SEED OIL PO) Take 1,400 mg by mouth daily.  . fluticasone (FLONASE) 50 MCG/ACT nasal spray Place 2 sprays into both nostrils as needed for allergies or rhinitis.  . folic acid (FOLVITE) 539 MCG tablet Take 400 mcg by mouth daily.  Marland Kitchen glucosamine-chondroitin 500-400 MG tablet Take 1 tablet by mouth 2 (two) times daily.   Marland Kitchen loratadine (KLS ALLERCLEAR) 10 MG tablet Take 10 mg by mouth daily.  Marland Kitchen MAGNESIUM CITRATE PO Take 400 mg by mouth daily. 1 tab daily  . nitroGLYCERIN (NITROSTAT) 0.4 MG SL tablet Place 0.4 mg under the tongue every 5 (five) minutes as needed for chest pain.  . Omega-3 Fatty Acids (FISH OIL) 1000 MG CPDR Take 1,000 mg by mouth daily.   Marland Kitchen omeprazole (PRILOSEC) 20 MG capsule Take 40 mg by mouth daily.   Marland Kitchen Resveratrol 250 MG CAPS Take 250 mg by mouth daily.  . sertraline (ZOLOFT) 100 MG tablet Take 1 tablet (100 mg total) by mouth daily.  . TURMERIC PO Take 1,000 mg by mouth daily.   No facility-administered encounter medications on file as of 03/15/2020.    Review of Systems:  Review of Systems  Constitutional: Negative.   HENT: Negative.   Respiratory: Positive for cough and  shortness of breath.   Cardiovascular: Negative.   Gastrointestinal: Negative.   Genitourinary: Positive for dysuria, frequency and urgency.  Musculoskeletal: Positive for arthralgias, gait problem and myalgias.  Skin: Negative.   Neurological: Positive for weakness.  Psychiatric/Behavioral: Positive for dysphoric mood.    Health Maintenance  Topic Date Due  . TETANUS/TDAP  Never done  . INFLUENZA VACCINE  07/02/2020  . MAMMOGRAM  02/13/2021  . DEXA SCAN  Completed  . PNA vac Low Risk Adult  Completed    Physical Exam: Vitals:   03/15/20 1535  BP: 108/64  Pulse: 69  Temp: 98 F (36.7 C)  SpO2: 94%  Weight: 265 lb 9.6 oz (  120.5 kg)  Height: _0  (1.676 m)   Body mass index is 42.87 kg/m. Physical Exam Vitals reviewed.  Constitutional:      Appearance: She is obese.  HENT:     Head: Normocephalic.     Nose: Nose normal.     Mouth/Throat:     Mouth: Mucous membranes are moist.     Pharynx: Oropharynx is clear.  Eyes:     Pupils: Pupils are equal, round, and reactive to light.  Cardiovascular:     Rate and Rhythm: Normal rate and regular rhythm.     Pulses: Normal pulses.     Heart sounds: Normal heart sounds.  Pulmonary:     Effort: Pulmonary effort is normal.     Breath sounds: Normal breath sounds.  Abdominal:     General: Abdomen is flat. Bowel sounds are normal.     Palpations: Abdomen is soft.  Musculoskeletal:        General: No swelling.     Cervical back: Neck supple.  Skin:    General: Skin is warm and dry.  Neurological:     General: No focal deficit present.     Mental Status: She is alert and oriented to person, place, and time.  Psychiatric:        Mood and Affect: Mood normal.        Thought Content: Thought content normal.     Labs reviewed: Basic Metabolic Panel: Recent Labs    07/21/19 1444 03/13/20 0810  NA 138 140  K 4.4 4.3  CL 99 103  CO2 23 30  GLUCOSE 101* 121*  BUN 26 24  CREATININE 0.98 0.99*  CALCIUM 9.4 9.5    TSH 2.790  --    Liver Function Tests: Recent Labs    07/21/19 1444 03/13/20 0810  AST 22 19  ALT 14 13  ALKPHOS 80  --   BILITOT 0.3 0.4  PROT 6.5 6.7  ALBUMIN 4.1  --    No results for input(s): LIPASE, AMYLASE in the last 8760 hours. No results for input(s): AMMONIA in the last 8760 hours. CBC: Recent Labs    07/21/19 1444 03/13/20 0810  WBC 9.3 8.6  NEUTROABS  --  4,678  HGB 14.1 15.1  HCT 41.9 45.5*  MCV 85 86.5  PLT 275 261   Lipid Panel: Recent Labs    03/13/20 0810  CHOL 203*  HDL 52  LDLCALC 126*  TRIG 141  CHOLHDL 3.9   Lab Results  Component Value Date   HGBA1C 5.8 (H) 03/13/2020    Procedures since last visit: No results found.  Assessment/Plan 1. Dysuria  - Urinalysis with Reflex Microscopic; Future - Culture, Urine; Future    PAF (paroxysmal atrial fibrillation) (HCC) On Eliquis Follows with Cardiology  Pulmonary fibrosis ([address]), followed by Pulmonolgy Has Cough and SOB Is Going to follow with Pulmonology OSA on CPAP, compliant TOelrating Reactive depression On Zoloft Referal to Psychologist PHQ 15 Renewed PRN Valium Per Patient 30 tabs last for 3 monts Arthritis Uses Electric Chair Has Follow up with Ortho  Prediabetes A1C stable Hyperlipidemia, unspecified hyperlipidemia type LDL elevated She says this is new for her Does not want statin yet  Up To date on All vaccinations including Shingles Got Mammogram and DEXA at [facility]   Labs/tests ordered:  * No order type specified * Next appt:  Visit date not found  Needs Follow up

## 2020-03-16 ENCOUNTER — Other Ambulatory Visit: Payer: Self-pay

## 2020-03-16 DIAGNOSIS — R300 Dysuria: Secondary | ICD-10-CM | POA: Diagnosis not present

## 2020-03-17 ENCOUNTER — Other Ambulatory Visit: Payer: Self-pay | Admitting: Internal Medicine

## 2020-03-17 ENCOUNTER — Encounter: Payer: Self-pay | Admitting: *Deleted

## 2020-03-17 DIAGNOSIS — F331 Major depressive disorder, recurrent, moderate: Secondary | ICD-10-CM

## 2020-03-17 DIAGNOSIS — F329 Major depressive disorder, single episode, unspecified: Secondary | ICD-10-CM

## 2020-03-17 LAB — URINALYSIS, ROUTINE W REFLEX MICROSCOPIC
Bacteria, UA: NONE SEEN /HPF
Bilirubin Urine: NEGATIVE
Glucose, UA: NEGATIVE
Hgb urine dipstick: NEGATIVE
Hyaline Cast: NONE SEEN /LPF
Ketones, ur: NEGATIVE
Nitrite: NEGATIVE
Protein, ur: NEGATIVE
Specific Gravity, Urine: 1.013 (ref 1.001–1.03)
pH: 5.5 (ref 5.0–8.0)

## 2020-03-17 LAB — URINE CULTURE
MICRO NUMBER:: 10367937
SPECIMEN QUALITY:: ADEQUATE

## 2020-04-12 ENCOUNTER — Telehealth: Payer: Self-pay

## 2020-04-12 ENCOUNTER — Ambulatory Visit (INDEPENDENT_AMBULATORY_CARE_PROVIDER_SITE_OTHER): Payer: Medicare Other | Admitting: *Deleted

## 2020-04-12 DIAGNOSIS — I495 Sick sinus syndrome: Secondary | ICD-10-CM

## 2020-04-12 DIAGNOSIS — I480 Paroxysmal atrial fibrillation: Secondary | ICD-10-CM

## 2020-04-12 NOTE — Telephone Encounter (Signed)
Left message for patient to remind of missed remote transmission.  

## 2020-04-13 LAB — CUP PACEART REMOTE DEVICE CHECK
Battery Remaining Longevity: 57 mo
Battery Voltage: 2.99 V
Brady Statistic AP VP Percent: 0.04 %
Brady Statistic AP VS Percent: 70.66 %
Brady Statistic AS VP Percent: 0.02 %
Brady Statistic AS VS Percent: 29.28 %
Brady Statistic RA Percent Paced: 69.19 %
Brady Statistic RV Percent Paced: 0.06 %
Date Time Interrogation Session: 20210512143957
Implantable Lead Implant Date: 20151103
Implantable Lead Implant Date: 20151103
Implantable Lead Location: 753859
Implantable Lead Location: 753860
Implantable Lead Model: 5076
Implantable Lead Model: 5076
Implantable Pulse Generator Implant Date: 20151103
Lead Channel Impedance Value: 380 Ohm
Lead Channel Impedance Value: 437 Ohm
Lead Channel Impedance Value: 437 Ohm
Lead Channel Impedance Value: 475 Ohm
Lead Channel Pacing Threshold Amplitude: 0.5 V
Lead Channel Pacing Threshold Amplitude: 0.5 V
Lead Channel Pacing Threshold Pulse Width: 0.4 ms
Lead Channel Pacing Threshold Pulse Width: 0.4 ms
Lead Channel Sensing Intrinsic Amplitude: 3.25 mV
Lead Channel Sensing Intrinsic Amplitude: 3.25 mV
Lead Channel Sensing Intrinsic Amplitude: 5.75 mV
Lead Channel Sensing Intrinsic Amplitude: 5.75 mV
Lead Channel Setting Pacing Amplitude: 1.5 V
Lead Channel Setting Pacing Amplitude: 2 V
Lead Channel Setting Pacing Pulse Width: 0.4 ms
Lead Channel Setting Sensing Sensitivity: 2.8 mV

## 2020-04-14 NOTE — Progress Notes (Signed)
Remote pacemaker transmission.   

## 2020-04-15 ENCOUNTER — Other Ambulatory Visit: Payer: Self-pay | Admitting: Cardiovascular Disease

## 2020-04-20 DIAGNOSIS — M25562 Pain in left knee: Secondary | ICD-10-CM | POA: Diagnosis not present

## 2020-05-03 ENCOUNTER — Telehealth: Payer: Self-pay | Admitting: Cardiovascular Disease

## 2020-05-03 NOTE — Telephone Encounter (Signed)
[name] office out of Eliquis samples.

## 2020-05-03 NOTE — Telephone Encounter (Signed)
Patient calling the office for samples of medication:   1.  What medication and dosage are you requesting samples for? ELIQUIS 5 MG TABS tablet  2.  Are you currently out of this medication? No, patient has a weeks worth

## 2020-05-05 ENCOUNTER — Telehealth: Payer: Self-pay | Admitting: Cardiovascular Disease

## 2020-05-05 NOTE — Telephone Encounter (Signed)
Accessed patient's chart to view previous telephone note in regards to Samples. [place] states [name] [name] advised her to call back and check with the pharmacy to see if the office had any come in. I reached out to the pharmacy that informed me they still do not have Eliquis samples. I then advised Gaynell of this and she verbalized understanding stating she'd callback next week to check again.

## 2020-05-09 ENCOUNTER — Telehealth: Payer: Self-pay | Admitting: Cardiovascular Disease

## 2020-05-09 DIAGNOSIS — J8410 Pulmonary fibrosis, unspecified: Secondary | ICD-10-CM | POA: Diagnosis not present

## 2020-05-09 DIAGNOSIS — R7303 Prediabetes: Secondary | ICD-10-CM | POA: Diagnosis not present

## 2020-05-09 DIAGNOSIS — F419 Anxiety disorder, unspecified: Secondary | ICD-10-CM | POA: Diagnosis not present

## 2020-05-09 DIAGNOSIS — Z950 Presence of cardiac pacemaker: Secondary | ICD-10-CM | POA: Diagnosis not present

## 2020-05-09 DIAGNOSIS — M1712 Unilateral primary osteoarthritis, left knee: Secondary | ICD-10-CM | POA: Diagnosis not present

## 2020-05-09 DIAGNOSIS — Z6841 Body Mass Index (BMI) 40.0 and over, adult: Secondary | ICD-10-CM | POA: Diagnosis not present

## 2020-05-09 DIAGNOSIS — I480 Paroxysmal atrial fibrillation: Secondary | ICD-10-CM | POA: Diagnosis not present

## 2020-05-09 NOTE — Telephone Encounter (Signed)
Pt aware we are out of samples but to call back on friday to see if order came in [facility]

## 2020-05-09 NOTE — Telephone Encounter (Signed)
Patient calling the office for samples of medication:   1.  What medication and dosage are you requesting samples for?ELIQUIS 5 MG TABS tablet  2.  Are you currently out of this medication? Almost out.

## 2020-05-12 NOTE — Telephone Encounter (Signed)
Follow Up  Patient is calling in to follow up about samples. Please call back and advise.

## 2020-05-12 NOTE — Telephone Encounter (Signed)
Spoke with pt, aware no samples available.

## 2020-05-16 NOTE — Telephone Encounter (Signed)
Message left per DPR. 1 weeks worth of samples given and a patient assistance application for completion.

## 2020-05-16 NOTE — Telephone Encounter (Signed)
Follow Up  Patient is calling back in to see if there are any samples of Eliquis available at this time. Please call back to confirm.

## 2020-05-17 ENCOUNTER — Other Ambulatory Visit: Payer: Self-pay | Admitting: *Deleted

## 2020-05-17 MED ORDER — DIAZEPAM 5 MG PO TABS: ORAL_TABLET | ORAL | 0 refills | Status: DC

## 2020-05-17 NOTE — Telephone Encounter (Signed)
Patient requested refill Pended Rx and sent to Dr. Lyndel Safe for approval.

## 2020-05-30 NOTE — Telephone Encounter (Signed)
Error  

## 2020-05-31 ENCOUNTER — Ambulatory Visit (HOSPITAL_COMMUNITY)
Admission: RE | Admit: 2020-05-31 | Discharge: 2020-05-31 | Disposition: A | Discharge status: Home / Self Care | Payer: Medicare Other | Source: Ambulatory Visit | Attending: Internal Medicine | Admitting: Internal Medicine

## 2020-05-31 ENCOUNTER — Other Ambulatory Visit: Payer: Self-pay

## 2020-05-31 DIAGNOSIS — J849 Interstitial pulmonary disease, unspecified: Secondary | ICD-10-CM

## 2020-06-11 DIAGNOSIS — R300 Dysuria: Secondary | ICD-10-CM | POA: Diagnosis not present

## 2020-06-11 DIAGNOSIS — N390 Urinary tract infection, site not specified: Secondary | ICD-10-CM | POA: Diagnosis not present

## 2020-06-12 ENCOUNTER — Telehealth: Payer: Self-pay | Admitting: Internal Medicine

## 2020-06-12 NOTE — Telephone Encounter (Signed)
Pt called to cx appt that she had sch 06/14/20(wed) with Dr.Gupta. She wasn't on the sch for wed, so I offered to make an appt & when checking the notes from appt 4/14, nothing is documented as to what she is to be sch for & when?  She has asked to have CMA call once we have a plan & sch in place. Thanks, Nabeeha Badertscher [phone]

## 2020-06-14 DIAGNOSIS — R102 Pelvic and perineal pain: Secondary | ICD-10-CM | POA: Diagnosis not present

## 2020-06-14 NOTE — Telephone Encounter (Signed)
Dr. Lyndel Safe, when would you like to see the patient back and does she need labs?

## 2020-06-14 NOTE — Telephone Encounter (Signed)
Will need to see me in open Appointment in August

## 2020-06-16 ENCOUNTER — Telehealth: Payer: Self-pay | Admitting: Cardiovascular Disease

## 2020-06-16 NOTE — Telephone Encounter (Signed)
Called and spoke with pt, notified that we do not have any samples of eliquis in office at this time. Advised she call back again next week to check if we have been restocked. Pt verbalized understanding and was thankful for the call. No other questions at this time.

## 2020-06-16 NOTE — Telephone Encounter (Signed)
New message   Patient calling the office for samples of medication:   1.  What medication and dosage are you requesting samples for?  ELIQUIS 5 MG TABS tablet     2.  Are you currently out of this medication? No

## 2020-06-16 NOTE — Telephone Encounter (Signed)
Patient scheduled and mailed summary.

## 2020-06-20 ENCOUNTER — Telehealth: Payer: Self-pay | Admitting: Cardiovascular Disease

## 2020-06-20 NOTE — Telephone Encounter (Signed)
Patient calling the office for samples of medication:   1.  What medication and dosage are you requesting samples for? ELIQUIS 5 MG TABS tablet  2.  Are you currently out of this medication? Pt has two days left  PT says she will be in the donut hole soon and would like to prolong paying the high price if possible

## 2020-06-20 NOTE — Telephone Encounter (Signed)
Called patient, we currently do not have any in stock patient verbalized understanding.  Will call back to check in a few days.

## 2020-06-23 NOTE — Telephone Encounter (Signed)
Called and spoke with pt, notified we still do not have any samples of Eliquis in the office. Pt verbalized understanding and states she will call back in next week.

## 2020-06-23 NOTE — Telephone Encounter (Signed)
Patient calling back to see if there are any samples.

## 2020-06-25 ENCOUNTER — Other Ambulatory Visit: Payer: Self-pay | Admitting: Internal Medicine

## 2020-06-25 DIAGNOSIS — F331 Major depressive disorder, recurrent, moderate: Secondary | ICD-10-CM

## 2020-06-28 ENCOUNTER — Telehealth: Payer: Self-pay | Admitting: Internal Medicine

## 2020-06-28 ENCOUNTER — Telehealth: Payer: Self-pay | Admitting: Pharmacist

## 2020-06-28 DIAGNOSIS — J849 Interstitial pulmonary disease, unspecified: Secondary | ICD-10-CM

## 2020-06-28 NOTE — Telephone Encounter (Signed)
Patient denied for Patient Assistance and soon to be in "donut hole".  Medication Samples have been provided to the patient.  Drug name: Eliquis    Strength: 54m     Qty:28 tab LHXT:[id number]  Exp.Date: MAR/2023

## 2020-06-28 NOTE — Telephone Encounter (Signed)
Will hold in triage until tomorrow per pt's request.

## 2020-06-28 NOTE — Telephone Encounter (Signed)
Pt adds to call tomorrow 7/29 in the afternoon (later in the day)

## 2020-06-29 NOTE — Telephone Encounter (Signed)
The most recent CT scan of the chest does show evidence of pulmonary fibrosis.  The radiologist feels is slightly worse compared to May 2020.  Her previous autoimmune profile was negative.  Her last pulmonary function test was in August 2020.  Plan - needs repat PFT - fvc /dlco  = - needs to do ILD questionnaire  - I can see her in sept 2021   PFT Results Latest Ref Rng & Units 07/19/2019 02/24/2017  FVC-Pre L 2.74 2.58  FVC-Predicted Pre % 93 85  FVC-Post L 2.63 2.45  FVC-Predicted Post % 89 81  Pre FEV1/FVC % % 86 82  Post FEV1/FCV % % 90 87  FEV1-Pre L 2.36 2.12  FEV1-Predicted Pre % 107 93  FEV1-Post L 2.35 2.14  DLCO UNC% % 81 59  DLCO COR %Predicted % 104 79  TLC L 3.97 -  TLC % Predicted % 74 -  RV % Predicted % 52 -

## 2020-06-29 NOTE — Telephone Encounter (Signed)
At pt's last OV with CY, CY referred pt to MR due to ILD. Called and spoke with pt letting her know this info and she verbalized understanding. Stated to her that we would check with MR in regards to the results of her recent CT and then call her back.  MR, please advise on the results of pt's recent CT.

## 2020-06-29 NOTE — Telephone Encounter (Signed)
LMTCB x1 for pt.

## 2020-07-03 NOTE — Telephone Encounter (Signed)
Spoke with pt. She is aware of MR's response. She has been scheduled for PFT and OV with MR. Nothing further was needed.

## 2020-07-11 DIAGNOSIS — L821 Other seborrheic keratosis: Secondary | ICD-10-CM | POA: Diagnosis not present

## 2020-07-11 DIAGNOSIS — L820 Inflamed seborrheic keratosis: Secondary | ICD-10-CM | POA: Diagnosis not present

## 2020-07-11 DIAGNOSIS — L918 Other hypertrophic disorders of the skin: Secondary | ICD-10-CM | POA: Diagnosis not present

## 2020-07-12 ENCOUNTER — Ambulatory Visit (INDEPENDENT_AMBULATORY_CARE_PROVIDER_SITE_OTHER): Payer: Medicare Other | Admitting: *Deleted

## 2020-07-12 DIAGNOSIS — I495 Sick sinus syndrome: Secondary | ICD-10-CM

## 2020-07-13 LAB — CUP PACEART REMOTE DEVICE CHECK
Battery Remaining Longevity: 62 mo
Battery Voltage: 2.99 V
Brady Statistic AP VP Percent: 0.04 %
Brady Statistic AP VS Percent: 70.86 %
Brady Statistic AS VP Percent: 0.04 %
Brady Statistic AS VS Percent: 29.06 %
Brady Statistic RA Percent Paced: 70.4 %
Brady Statistic RV Percent Paced: 0.08 %
Date Time Interrogation Session: 20210812114558
Implantable Lead Implant Date: 20151103
Implantable Lead Implant Date: 20151103
Implantable Lead Location: 753859
Implantable Lead Location: 753860
Implantable Lead Model: 5076
Implantable Lead Model: 5076
Implantable Pulse Generator Implant Date: 20151103
Lead Channel Impedance Value: 418 Ohm
Lead Channel Impedance Value: 456 Ohm
Lead Channel Impedance Value: 494 Ohm
Lead Channel Impedance Value: 513 Ohm
Lead Channel Pacing Threshold Amplitude: 0.5 V
Lead Channel Pacing Threshold Amplitude: 0.625 V
Lead Channel Pacing Threshold Pulse Width: 0.4 ms
Lead Channel Pacing Threshold Pulse Width: 0.4 ms
Lead Channel Sensing Intrinsic Amplitude: 2.875 mV
Lead Channel Sensing Intrinsic Amplitude: 2.875 mV
Lead Channel Sensing Intrinsic Amplitude: 7.125 mV
Lead Channel Sensing Intrinsic Amplitude: 7.125 mV
Lead Channel Setting Pacing Amplitude: 1.5 V
Lead Channel Setting Pacing Amplitude: 2 V
Lead Channel Setting Pacing Pulse Width: 0.4 ms
Lead Channel Setting Sensing Sensitivity: 2.8 mV

## 2020-07-14 NOTE — Progress Notes (Signed)
Remote pacemaker transmission.   

## 2020-07-18 ENCOUNTER — Telehealth: Payer: Self-pay | Admitting: Cardiovascular Disease

## 2020-07-18 NOTE — Telephone Encounter (Signed)
Patient calling the office for samples of medication:   1.  What medication and dosage are you requesting samples for? Eliquis   2.  Are you currently out of this medication? No

## 2020-07-18 NOTE — Telephone Encounter (Signed)
Left message for patient, no samples available at this time.

## 2020-07-20 ENCOUNTER — Ambulatory Visit (INDEPENDENT_AMBULATORY_CARE_PROVIDER_SITE_OTHER): Payer: Medicare Other | Admitting: Internal Medicine

## 2020-07-20 ENCOUNTER — Other Ambulatory Visit: Payer: Self-pay

## 2020-07-20 ENCOUNTER — Encounter: Payer: Self-pay | Admitting: Internal Medicine

## 2020-07-20 DIAGNOSIS — Z6841 Body Mass Index (BMI) 40.0 and over, adult: Secondary | ICD-10-CM

## 2020-07-20 DIAGNOSIS — Z148 Genetic carrier of other disease: Secondary | ICD-10-CM | POA: Diagnosis not present

## 2020-07-20 DIAGNOSIS — R0609 Other forms of dyspnea: Secondary | ICD-10-CM

## 2020-07-20 DIAGNOSIS — R0600 Dyspnea, unspecified: Secondary | ICD-10-CM

## 2020-07-20 DIAGNOSIS — J8410 Pulmonary fibrosis, unspecified: Secondary | ICD-10-CM

## 2020-07-20 NOTE — Progress Notes (Signed)
HPI  female former smoker followed for dyspnea on exertion, lung nodules, fibrosis, complicated by A. 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 She does not qualify for portable oxygen CT chest 01/06/17-pulmonary fibrosis, small nodules ANA 1:160 PFT 07/19/2019- mild restriction. FVC 2.94/ 93%, FEV1 2.20/ 107%, R .86, FEF25-75% 3.48/ 110%, TLC 74%, DLCO 81%, no resp to BD ------------------------------------------------------------------------------------------   07/19/2019- 79 year old female former smoker followed for dyspnea on exertion, lung nodules, fibrosis, ANA 6:237, complicated by A. 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 TKR deferred due to Covid. Activity limited to ADLs and not losing weight.  Discussed her CT showing mild stable fibrosis. Also reviewed Echo and PFT. She is anxious, still grieving death of daughter from a1AT resp failure. She presses me to support trial of glutathione because it made her daughter "look better in the face". She wants to see Dr Chase Caller for his expertise re interstitial lung disease, although she is aware of mild chronic stable fibrosis and stable nodules.  PFT 07/19/2019- mild restriction. FVC 2.94/ 93%, FEV1 2.20/ 107%, R .86, FEF25-75% 3.48/ 110%, TLC 74%, DLCO 81%, no resp to BD ECHO- 05/18/2019-  1. The left ventricle has low normal systolic function, with an ejection fraction of 50-55%. The cavity size was normal. Left ventricular diastolic function could not be evaluated.  2. The right ventricle has normal systolic function. The cavity was normal.  3. The mitral valve is  grossly normal.  4. The tricuspid valve is grossly normal.  5. The aortic valve is tricuspid. Mild thickening of the aortic valve. No stenosis of the aortic valve.  6. Normal LV systolic function; no significant valvular heart disease. CT chest 04/05/2019- IMPRESSION: No acute cardiopulmonary disease. Mild chronic stable fibrotic change. Several small subcentimeter mostly peripheral pulmonary nodules with the largest measuring 7 mm over the posterolateral right lower lobe. These are unchanged for greater than 2 years and therefore considered benign. Aortic Atherosclerosis (ICD10-I70.0).  07/20/20- 79 year old female former smoker followed for dyspnea on exertion, lung nodules, Fibrosis, ANA 6:283, complicated by A. 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 Planned f/u CT and Spirometry.  Had 2 Moderna Covax Body weight today 261 lbs We reviewed CT and discussed ILD/ UIP. Understands he does not recommend antifibrotic at this time, but has ordered PFT. Describes uncomfortable sensation L lower costal margin. Not tender and I can't feel spleen. Discussed Covid booster.  She asks about PT referral for deconditioning and for shoulder pain. CT chest High Res 05/31/20-  IMPRESSION: 1. The appearance of the lungs is compatible with interstitial lung disease, which appears mildly progressive compared to the prior examination, with a spectrum of findings considered probable usual interstitial pneumonia (UIP) per current ATS guidelines. 2. Small pulmonary nodules measuring 5 mm or less in size, stable dating back to 2018, considered definitively benign. 3. Aortic atherosclerosis, in addition to left circumflex coronary artery disease. Please note that although the presence of coronary artery calcium documents the presence of coronary artery disease, the severity of this  disease and any potential stenosis cannot be assessed on this non-gated CT examination. Assessment for potential risk  factor modification, dietary therapy or pharmacologic therapy may be warranted, if clinically indicated. Aortic Atherosclerosis (ICD10-I70.0).  PFT 08/10/20- pending  ROS-see HPI   + = positive Constitutional:    weight loss, night sweats, fevers, chills, fatigue, lassitude. HEENT:    headaches, difficulty swallowing, tooth/dental problems, sore throat,       sneezing, itching, ear ache, nasal congestion, post nasal drip, snoring CV:    chest pain, orthopnea, PND, swelling in lower extremities, anasarca,                                                 dizziness, palpitations Resp:   +shortness of breath with exertion or at rest.                +productive cough,   non-productive cough, coughing up of blood.              change in color of mucus.  wheezing.   Skin:    rash or lesions. GI:  No-   heartburn, indigestion, abdominal pain, nausea, vomiting, diarrhea,                 change in bowel habits, loss of appetite GU: dysuria, change in color of urine, no urgency or frequency.   flank pain. MS:   joint pain, stiffness, decreased range of motion, back pain. Neuro-     nothing unusual Psych:  change in mood or affect.  depression or anxiety.   memory loss.  OBJ- Physical Exam General- Alert, Oriented, Affect-appropriate, Distress- none acute, + obese Skin- rash-none, lesions- none, excoriation- none Lymphadenopathy- none Head- atraumatic            Eyes- Gross vision intact, PERRLA, conjunctivae and secretions clear            Ears- Hearing, canals-normal            Nose- Clear, no-Septal dev, mucus, polyps, erosion, perforation             Throat- Mallampati II , mucosa clear , drainage- none, tonsils- atrophic Neck- flexible , trachea midline, no stridor , thyroid nl, carotid no bruit Chest - symmetrical excursion , unlabored           Heart/CV- RRR , no murmur ,  no gallop  , no rub, nl s1 s2                           - JVD- none , edema- none, stasis changes- none, varices- none           Lung- + clear- no crackles heard, wheeze- none, cough- none , dullness-none, rub- none           Chest wall- +Pacemaker Left Abd-  Br/ Gen/ Rectal- Not done, not indicated Extrem- cyanosis- none, clubbing, none, atrophy- none, strength- nl Neuro- grossly intact to observation

## 2020-07-20 NOTE — Patient Instructions (Signed)
Follow up with Dr Chase Caller and plans for PFT as scheduled  Talk with your primary doctor about referral for physical therapy for yoiur shoulder and general deconditioning  I favor getting a Covid vaccine booster when available  Please call if I can help

## 2020-07-26 ENCOUNTER — Other Ambulatory Visit: Payer: Self-pay

## 2020-07-26 ENCOUNTER — Encounter: Payer: Self-pay | Admitting: Internal Medicine

## 2020-07-26 ENCOUNTER — Non-Acute Institutional Stay: Payer: Medicare Other | Admitting: Internal Medicine

## 2020-07-26 VITALS — BP 108/64 | HR 88 | Temp 97.6°F | Ht 67.0 in | Wt 261.0 lb

## 2020-07-26 DIAGNOSIS — M1990 Unspecified osteoarthritis, unspecified site: Secondary | ICD-10-CM | POA: Diagnosis not present

## 2020-07-26 DIAGNOSIS — I480 Paroxysmal atrial fibrillation: Secondary | ICD-10-CM | POA: Diagnosis not present

## 2020-07-26 DIAGNOSIS — E785 Hyperlipidemia, unspecified: Secondary | ICD-10-CM | POA: Diagnosis not present

## 2020-07-26 DIAGNOSIS — G4733 Obstructive sleep apnea (adult) (pediatric): Secondary | ICD-10-CM

## 2020-07-26 DIAGNOSIS — J8410 Pulmonary fibrosis, unspecified: Secondary | ICD-10-CM | POA: Diagnosis not present

## 2020-07-26 DIAGNOSIS — F329 Major depressive disorder, single episode, unspecified: Secondary | ICD-10-CM | POA: Diagnosis not present

## 2020-07-26 DIAGNOSIS — Z9989 Dependence on other enabling machines and devices: Secondary | ICD-10-CM | POA: Diagnosis not present

## 2020-07-26 DIAGNOSIS — R7303 Prediabetes: Secondary | ICD-10-CM

## 2020-07-26 MED ORDER — DIAZEPAM 5 MG PO TABS: ORAL_TABLET | ORAL | 0 refills | Status: DC

## 2020-07-26 NOTE — Progress Notes (Signed)
Location:  [place] of Service:  Clinic (12)  Provider:   Code Status:  Goals of Care:  Advanced Directives 09/23/2019  Does Patient Have a Medical Advance Directive? Yes  Type of Advance Directive Living will;Healthcare Power of Attorney  Does patient want to make changes to medical advance directive? No - Patient declined  Copy of [place] in Chart? Yes - validated most recent copy scanned in chart (See row information)     Chief Complaint  Patient presents with  . Medical Management of Chronic Issues    Patient returns to the office for her follow up.   Marland Kitchen Health Maintenance    Hep C, TDAP    HPI: Patient is a 79 y.o. female seen today for medical management of chronic diseases.    [place]oved to [place] in [place] 6 [place] ago. Was having difficulty managing house by Herself. Is independent. Uses Cane to walk and Conservator, museum/gallery for [employer]. Still drives No Falls recently  Active issues Dyspnea Had CT scan by Dr Annamaria Boots Has Mild ILD Is planning to see Dr Chase Caller. Continues to c/o Cough and SOB PAF with Tachybrady s/p Pacemaker On Eliquis Sees Cardiology Depression Active issue Takes Valium PRN. Also on Zoloft. Wants to talk to psychologist Pain in Knee and Shoulders Is feeling better since her Injection with Dr Maureen Ralphs Positive ANA Was seen by Dr Estanislado Pandy Who think there is no active disease LE edema Not needing Lasix anymore OSA On CPAP   Past Medical History:  Diagnosis Date  . Alpha-1-antitrypsin deficiency carrier   . Arthritis    "all over"  . Atherosclerosis of aorta ([place])   . Atrial fibrillation ([place])   . Back pain   . Complication of anesthesia    "I woke up during 2 different procedures" (10/04/2014)  . Diastolic dysfunction   . Frequent UTI   . GERD (gastroesophageal reflux disease)   . Hypertension   . Hypertriglyceridemia   . Insulin resistance   . Long term current use of anticoagulant   . Multiple lung  nodules   . Muscular deconditioning   . MVP (mitral valve prolapse)   . Obesity   . OSA on CPAP   . Osteoarthritis   . Osteopenia    unspecified location  . Pacemaker   . PAF (paroxysmal atrial fibrillation) ([place])   . Positive ANA (antinuclear antibody)   . Presbyesophagus   . Presence of permanent cardiac pacemaker   . Primary osteoarthritis of both hands    neck and spine and hips  . Pulmonary fibrosis ([place])    followed by pulmonolgy  . PVC's (premature ventricular contractions)   . Shortness of breath   . 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 Sleep apnea   . Stroke [facility]) 01/2014   denies deficits on 10/04/2014  . Tachy-brady syndrome ([place]) 10/2014  . TIA (transient ischemic attack)   . Vitamin D deficiency     Past Surgical History:  Procedure Laterality Date  . CARDIOVERSION N/A 09/02/2017   Procedure: CARDIOVERSION;  Surgeon: Sanda Klein, MD;  Location: [facility];  Service: Cardiovascular;  Laterality: N/A;  . COLONOSCOPY  2019  . ESOPHAGEAL DILATION    . EXCISION VAGINAL CYST     benign nodules  . INSERT / REPLACE / REMOVE PACEMAKER  10/04/2014   Medtronic Advisa model [id number] serial number [id number]  . LOOP RECORDER EXPLANT N/A 10/04/2014  Procedure: LOOP RECORDER EXPLANT;  Surgeon: Sanda Klein, MD;  Location: [facility];  Service: Cardiovascular;  Laterality: N/A;  . LOOP RECORDER IMPLANT N/A 04/19/2014   Procedure: LOOP RECORDER IMPLANT;  Surgeon: Sanda Klein, MD;  Location: [facility];  Service: Cardiovascular;  Laterality: N/A;  . NM MYOCAR PERF WALL MOTION  12/18/2007   normal  . PERMANENT PACEMAKER INSERTION N/A 10/04/2014   Procedure: PERMANENT PACEMAKER INSERTION;  Surgeon: Sanda Klein, MD;  Location: [facility];  Service: Cardiovascular;  Laterality: N/A;  . TUBAL LIGATION    . US ECHOCARDIOGRAPHY  05/15/2010   LA mildly dilated,mild mitral annular ca+, AOV mildly sclerotic, mild asymmetric LVH      Allergies  Allergen Reactions  . Ancef [Cefazolin] Hives and Itching  . Pseudoephedrine Hcl     Other reaction(s): palpitations (moderate)  . Ergotamine   . Pentobarbital   . Caffeine Palpitations    Outpatient Encounter Medications as of 07/26/2020  Medication Sig  . B Complex-C (SUPER B COMPLEX PO) Take 1 tablet by mouth daily.  . Biotin 5000 MCG CAPS Take 5,000 mcg by mouth daily.   . Calcium Carbonate-Vit D-Min (CALCIUM 1200 PO) Take 1,200 mg by mouth daily.   . cholecalciferol (VITAMIN D) 1000 UNITS tablet Take 6,000 Units by mouth daily.   Marland Kitchen Co-Enzyme Q10 100 MG CAPS Take 100 mg by mouth daily.   . diazepam (VALIUM) 5 MG tablet Take one tablet by mouth once daily  . ELIQUIS 5 MG TABS tablet TAKE 1 TABLET BY MOUTH TWICE DAILY.  Marland Kitchen Flaxseed, Linseed, (FLAX SEED OIL PO) Take 1,400 mg by mouth daily.  . fluticasone (FLONASE) 50 MCG/ACT nasal spray Place 2 sprays into both nostrils as needed for allergies or rhinitis.  . folic acid (FOLVITE) 326 MCG tablet Take 400 mcg by mouth daily.  Marland Kitchen glucosamine-chondroitin 500-400 MG tablet Take 1 tablet by mouth 2 (two) times daily.   Marland Kitchen loratadine (KLS ALLERCLEAR) 10 MG tablet Take 10 mg by mouth daily.  Marland Kitchen MAGNESIUM CITRATE PO Take 400 mg by mouth daily. 1 tab daily  . nitroGLYCERIN (NITROSTAT) 0.4 MG SL tablet Place 0.4 mg under the tongue every 5 (five) minutes as needed for chest pain.  . Omega-3 Fatty Acids (FISH OIL) 1000 MG CPDR Take 1,000 mg by mouth daily.   Marland Kitchen omeprazole (PRILOSEC) 20 MG capsule Take 40 mg by mouth daily.   Marland Kitchen Resveratrol 250 MG CAPS Take 250 mg by mouth daily.  . sertraline (ZOLOFT) 100 MG tablet TAKE 1 TABLET EACH DAY.  Marland Kitchen TURMERIC PO Take 1,000 mg by mouth daily.   No facility-administered encounter medications on file as of 07/26/2020.    Review of Systems:  Review of Systems  Constitutional: Positive for activity change.  HENT: Negative.   Respiratory: Positive for cough and shortness of breath.    Cardiovascular: Positive for leg swelling.  Gastrointestinal: Negative.   Genitourinary: Negative.   Musculoskeletal: Positive for arthralgias, gait problem and myalgias.  Skin: Negative.   Neurological: Positive for weakness.  Psychiatric/Behavioral: Positive for dysphoric mood. The patient is nervous/anxious.     Health Maintenance  Topic Date Due  . Hepatitis C Screening  Never done  . TETANUS/TDAP  Never done  . INFLUENZA VACCINE  07/02/2020  . MAMMOGRAM  02/13/2021  . DEXA SCAN  Completed  . COVID-19 Vaccine  Completed  . PNA vac Low Risk Adult  Completed    Physical Exam: Vitals:   07/26/20 1637  BP: 108/64  Pulse: 88  Temp: 97.6 F (36.4 C)  SpO2: 95%  Weight: 261 lb (118.4 kg)  Height: 5' 7" (1.702 m)   Body mass index is 40.88 kg/m. Physical Exam Vitals reviewed.  Constitutional:      Appearance: She is obese.  HENT:     Head: Normocephalic.     Nose: Nose normal.     Mouth/Throat:     Mouth: Mucous membranes are moist.     Pharynx: Oropharynx is clear.  Eyes:     Pupils: Pupils are equal, round, and reactive to light.  Cardiovascular:     Rate and Rhythm: Normal rate and regular rhythm.     Pulses: Normal pulses.     Heart sounds: Normal heart sounds.  Pulmonary:     Effort: Pulmonary effort is normal.     Breath sounds: Rales present.  Abdominal:     General: Abdomen is flat. Bowel sounds are normal.     Palpations: Abdomen is soft.  Musculoskeletal:        General: No swelling.     Cervical back: Neck supple.  Skin:    General: Skin is warm.  Neurological:     General: No focal deficit present.     Mental Status: She is alert and oriented to person, place, and time.  Psychiatric:     Comments: C/o Anxiety and Depression     Labs reviewed: Basic Metabolic Panel: Recent Labs    03/13/20 0810  NA 140  K 4.3  CL 103  CO2 30  GLUCOSE 121*  BUN 24  CREATININE 0.99*  CALCIUM 9.5   Liver Function Tests: Recent Labs     03/13/20 0810  AST 19  ALT 13  BILITOT 0.4  PROT 6.7   No results for input(s): LIPASE, AMYLASE in the last 8760 hours. No results for input(s): AMMONIA in the last 8760 hours. CBC: Recent Labs    03/13/20 0810  WBC 8.6  NEUTROABS 4,678  HGB 15.1  HCT 45.5*  MCV 86.5  PLT 261   Lipid Panel: Recent Labs    03/13/20 0810  CHOL 203*  HDL 52  LDLCALC 126*  TRIG 141  CHOLHDL 3.9   Lab Results  Component Value Date   HGBA1C 5.8 (H) 03/13/2020    Procedures since last visit: CUP PACEART REMOTE DEVICE CHECK  Result Date: 07/13/2020 Scheduled remote reviewed. Normal device function.  There was one 9.5 hour atrial arrhythmia detected, there is good ventricular rate control and the patient is on [place].  There was one fast A&V episode detected. Next remote 91 days. Kathy Breach, RN, CCDS, [employer]   Assessment/Plan ILD - Plan: Ambulatory referral to Physical Therapy Plan to see Dr Chase Caller OSA on CPAP - Plan: Ambulatory referral to Physical Therapy  Arthritis - Plan: Ambulatory referral to Physical Therapy Follows with Ortho Reactive depression Referal made to [facility] Patient will call. Numbers [id number] 15 Renewed PRN Valium Per Patient 30 tabs last for 3 months Also on Zoloft  PAF (paroxysmal atrial fibrillation) ([place]) - Plan: CBC with Differential/Platelet, COMPLETE METABOLIC PANEL WITH GFR On Eliquis Hyperlipidemia, unspecified hyperlipidemia type - Plan: Lipid Panel  Prediabetes - Plan: Hemoglobin A1c OSA on CPAP, compliant TOelrating    Up To date on All vaccinations including Shingles Got Mammogram and DEXA at [facility]   Labs/tests ordered:  * No order type specified * Next appt:  Visit date not found

## 2020-07-28 DIAGNOSIS — I480 Paroxysmal atrial fibrillation: Secondary | ICD-10-CM | POA: Diagnosis not present

## 2020-07-28 DIAGNOSIS — E785 Hyperlipidemia, unspecified: Secondary | ICD-10-CM | POA: Diagnosis not present

## 2020-07-28 DIAGNOSIS — R7303 Prediabetes: Secondary | ICD-10-CM | POA: Diagnosis not present

## 2020-07-29 DIAGNOSIS — Z148 Genetic carrier of other disease: Secondary | ICD-10-CM | POA: Insufficient documentation

## 2020-07-29 NOTE — Assessment & Plan Note (Signed)
Not successful at changing life style for long term weight loss

## 2020-07-29 NOTE — Assessment & Plan Note (Signed)
Obesity with deconditioning, ILD and diastolic dysfunction. Plan- PFT pending. Refer to Physical Therapy for help with deconditioning. She also c/o arthritic shoulder pain which I do not treat.

## 2020-07-29 NOTE — Assessment & Plan Note (Signed)
She will follow with Dr Chase Caller, who is expert on this condition. Plan- PFT

## 2020-07-29 NOTE — Assessment & Plan Note (Signed)
Continues to grieve and express guilt that daughter inherited a1AT def leading to respiratory failure.

## 2020-07-31 ENCOUNTER — Other Ambulatory Visit: Payer: Self-pay

## 2020-07-31 DIAGNOSIS — I480 Paroxysmal atrial fibrillation: Secondary | ICD-10-CM

## 2020-07-31 DIAGNOSIS — R531 Weakness: Secondary | ICD-10-CM | POA: Diagnosis not present

## 2020-07-31 DIAGNOSIS — E785 Hyperlipidemia, unspecified: Secondary | ICD-10-CM

## 2020-07-31 DIAGNOSIS — R7303 Prediabetes: Secondary | ICD-10-CM

## 2020-07-31 DIAGNOSIS — S4392XD Sprain of unspecified parts of left shoulder girdle, subsequent encounter: Secondary | ICD-10-CM | POA: Diagnosis not present

## 2020-07-31 DIAGNOSIS — M25512 Pain in left shoulder: Secondary | ICD-10-CM | POA: Diagnosis not present

## 2020-07-31 DIAGNOSIS — M6281 Muscle weakness (generalized): Secondary | ICD-10-CM | POA: Diagnosis not present

## 2020-08-01 DIAGNOSIS — M6281 Muscle weakness (generalized): Secondary | ICD-10-CM | POA: Diagnosis not present

## 2020-08-01 DIAGNOSIS — R531 Weakness: Secondary | ICD-10-CM | POA: Diagnosis not present

## 2020-08-01 DIAGNOSIS — M25512 Pain in left shoulder: Secondary | ICD-10-CM | POA: Diagnosis not present

## 2020-08-01 DIAGNOSIS — S4392XD Sprain of unspecified parts of left shoulder girdle, subsequent encounter: Secondary | ICD-10-CM | POA: Diagnosis not present

## 2020-08-01 LAB — LIPID PANEL
Cholesterol: 195 mg/dL (ref <200)
HDL: 55 mg/dL (ref >=50)
LDL Cholesterol (Calc): 115 mg/dL (calc) — ABNORMAL HIGH
Non-HDL Cholesterol (Calc): 140 mg/dL (calc) — ABNORMAL HIGH (ref <130)
Total CHOL/HDL Ratio: 3.5 (calc) (ref <5.0)
Triglycerides: 133 mg/dL (ref <150)

## 2020-08-01 LAB — CBC WITH DIFFERENTIAL/PLATELET
Absolute Monocytes: 944 cells/uL (ref 200–950)
Basophils Absolute: 60 cells/uL (ref 0–200)
Basophils Relative: 0.7 %
Eosinophils Absolute: 391 cells/uL (ref 15–500)
Eosinophils Relative: 4.6 %
HCT: 43.2 % (ref 35.0–45.0)
Hemoglobin: 14.5 g/dL (ref 11.7–15.5)
Lymphs Abs: 2618 cells/uL (ref 850–3900)
MCH: 29.4 pg (ref 27.0–33.0)
MCHC: 33.6 g/dL (ref 32.0–36.0)
MCV: 87.6 fL (ref 80.0–100.0)
MPV: 10.6 fL (ref 7.5–12.5)
Monocytes Relative: 11.1 %
Neutro Abs: 4488 cells/uL (ref 1500–7800)
Neutrophils Relative %: 52.8 %
Platelets: 260 10*3/uL (ref 140–400)
RBC: 4.93 10*6/uL (ref 3.80–5.10)
RDW: 12.6 % (ref 11.0–15.0)
Total Lymphocyte: 30.8 %
WBC: 8.5 10*3/uL (ref 3.8–10.8)

## 2020-08-01 LAB — COMPLETE METABOLIC PANEL WITH GFR
AG Ratio: 1.3 (calc) (ref 1.0–2.5)
ALT: 12 U/L (ref 6–29)
AST: 20 U/L (ref 10–35)
Albumin: 3.7 g/dL (ref 3.6–5.1)
Alkaline phosphatase (APISO): 75 U/L (ref 37–153)
BUN/Creatinine Ratio: 23 (calc) — ABNORMAL HIGH (ref 6–22)
BUN: 25 mg/dL (ref 7–25)
CO2: 28 mmol/L (ref 20–32)
Calcium: 9 mg/dL (ref 8.6–10.4)
Chloride: 105 mmol/L (ref 98–110)
Creat: 1.09 mg/dL — ABNORMAL HIGH (ref 0.60–0.93)
GFR, Est African American: 56 mL/min/{1.73_m2} — ABNORMAL LOW (ref >=60)
GFR, Est Non African American: 48 mL/min/{1.73_m2} — ABNORMAL LOW (ref >=60)
Globulin: 2.9 g/dL (calc) (ref 1.9–3.7)
Glucose, Bld: 111 mg/dL — ABNORMAL HIGH (ref 65–99)
Potassium: 4.3 mmol/L (ref 3.5–5.3)
Sodium: 141 mmol/L (ref 135–146)
Total Bilirubin: 0.5 mg/dL (ref 0.2–1.2)
Total Protein: 6.6 g/dL (ref 6.1–8.1)

## 2020-08-01 LAB — HEMOGLOBIN A1C
Hgb A1c MFr Bld: 5.9 % of total Hgb — ABNORMAL HIGH (ref <5.7)
Mean Plasma Glucose: 123 (calc)
eAG (mmol/L): 6.8 (calc)

## 2020-08-01 IMAGING — DX DG CHEST 2V
2 series · 2 of 2 positions shown · non-contrast
Comparison: CT chest of 04/03/2018 and chest x-ray of 12/10/2016

CLINICAL DATA: COPD, hypertension, atrial fibrillation

EXAM:
CHEST - 2 VIEW

[chest pa]
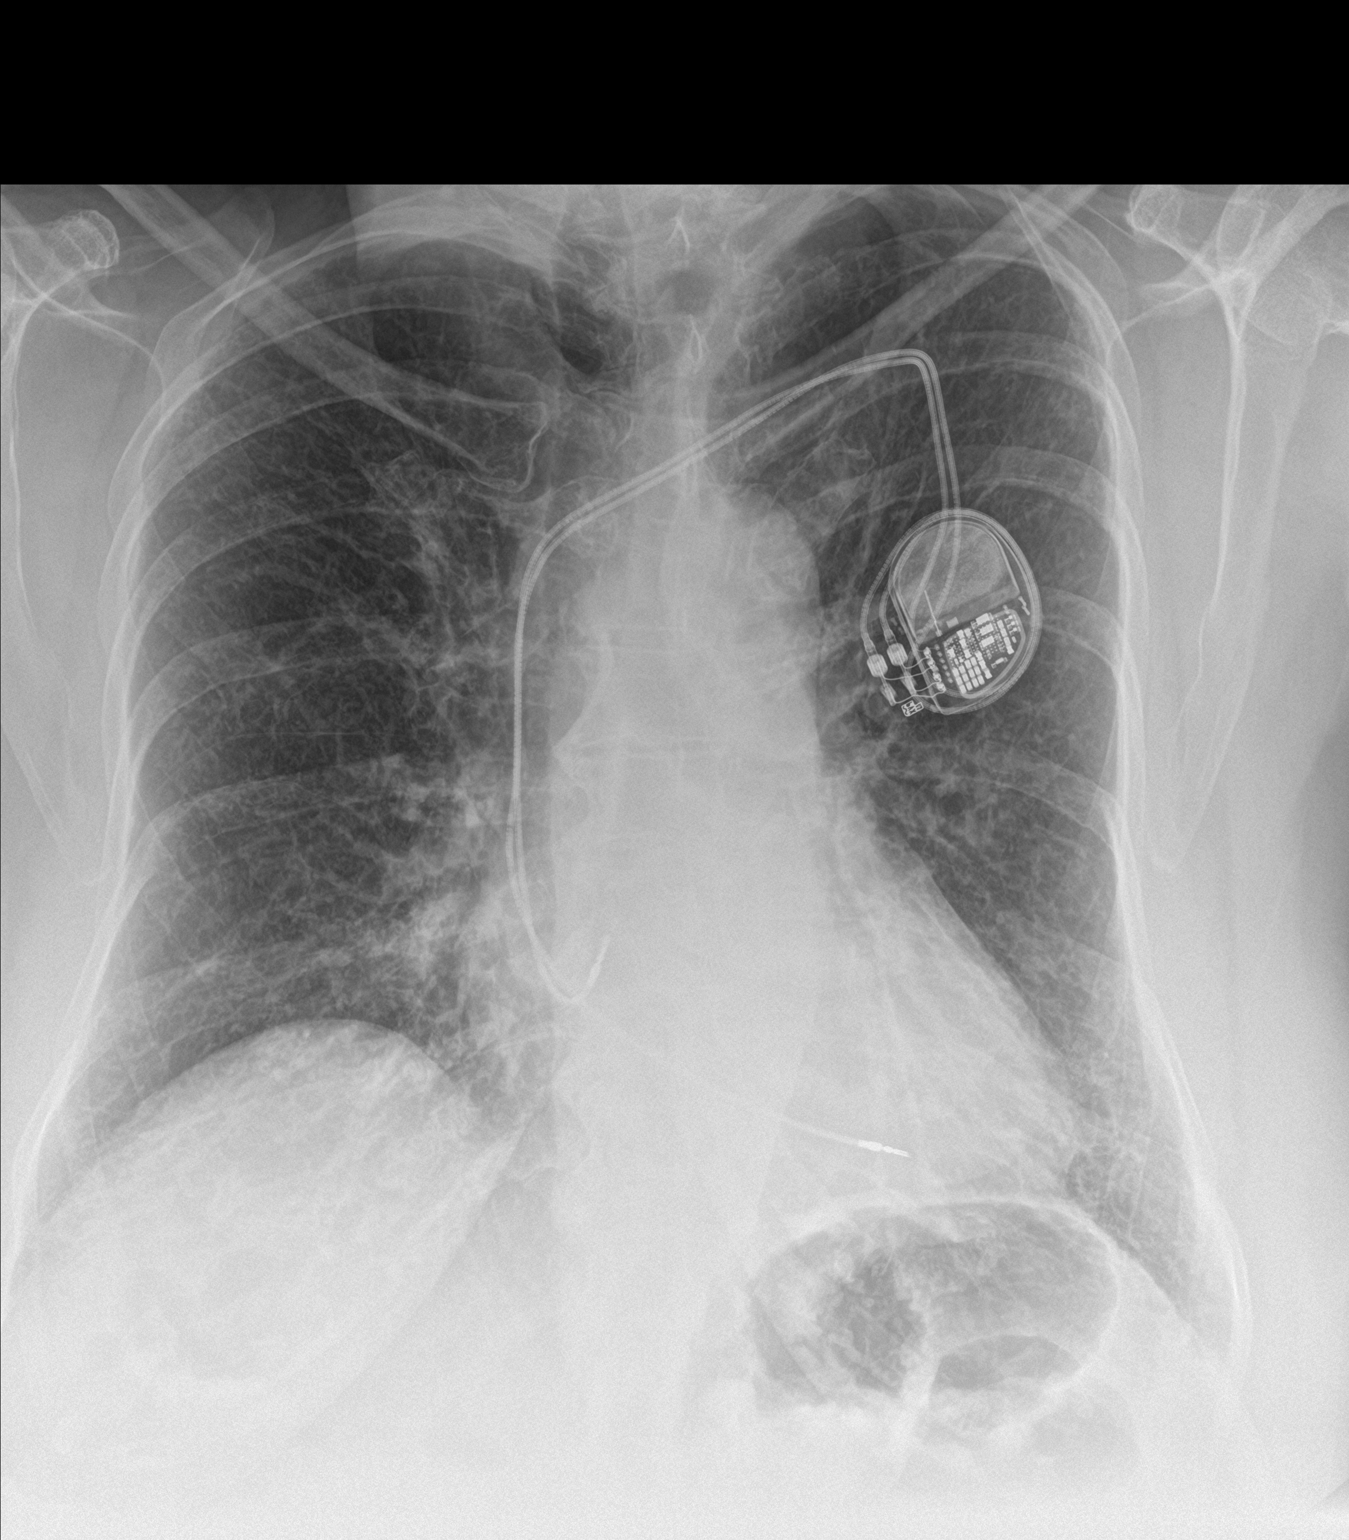

[chest lat]
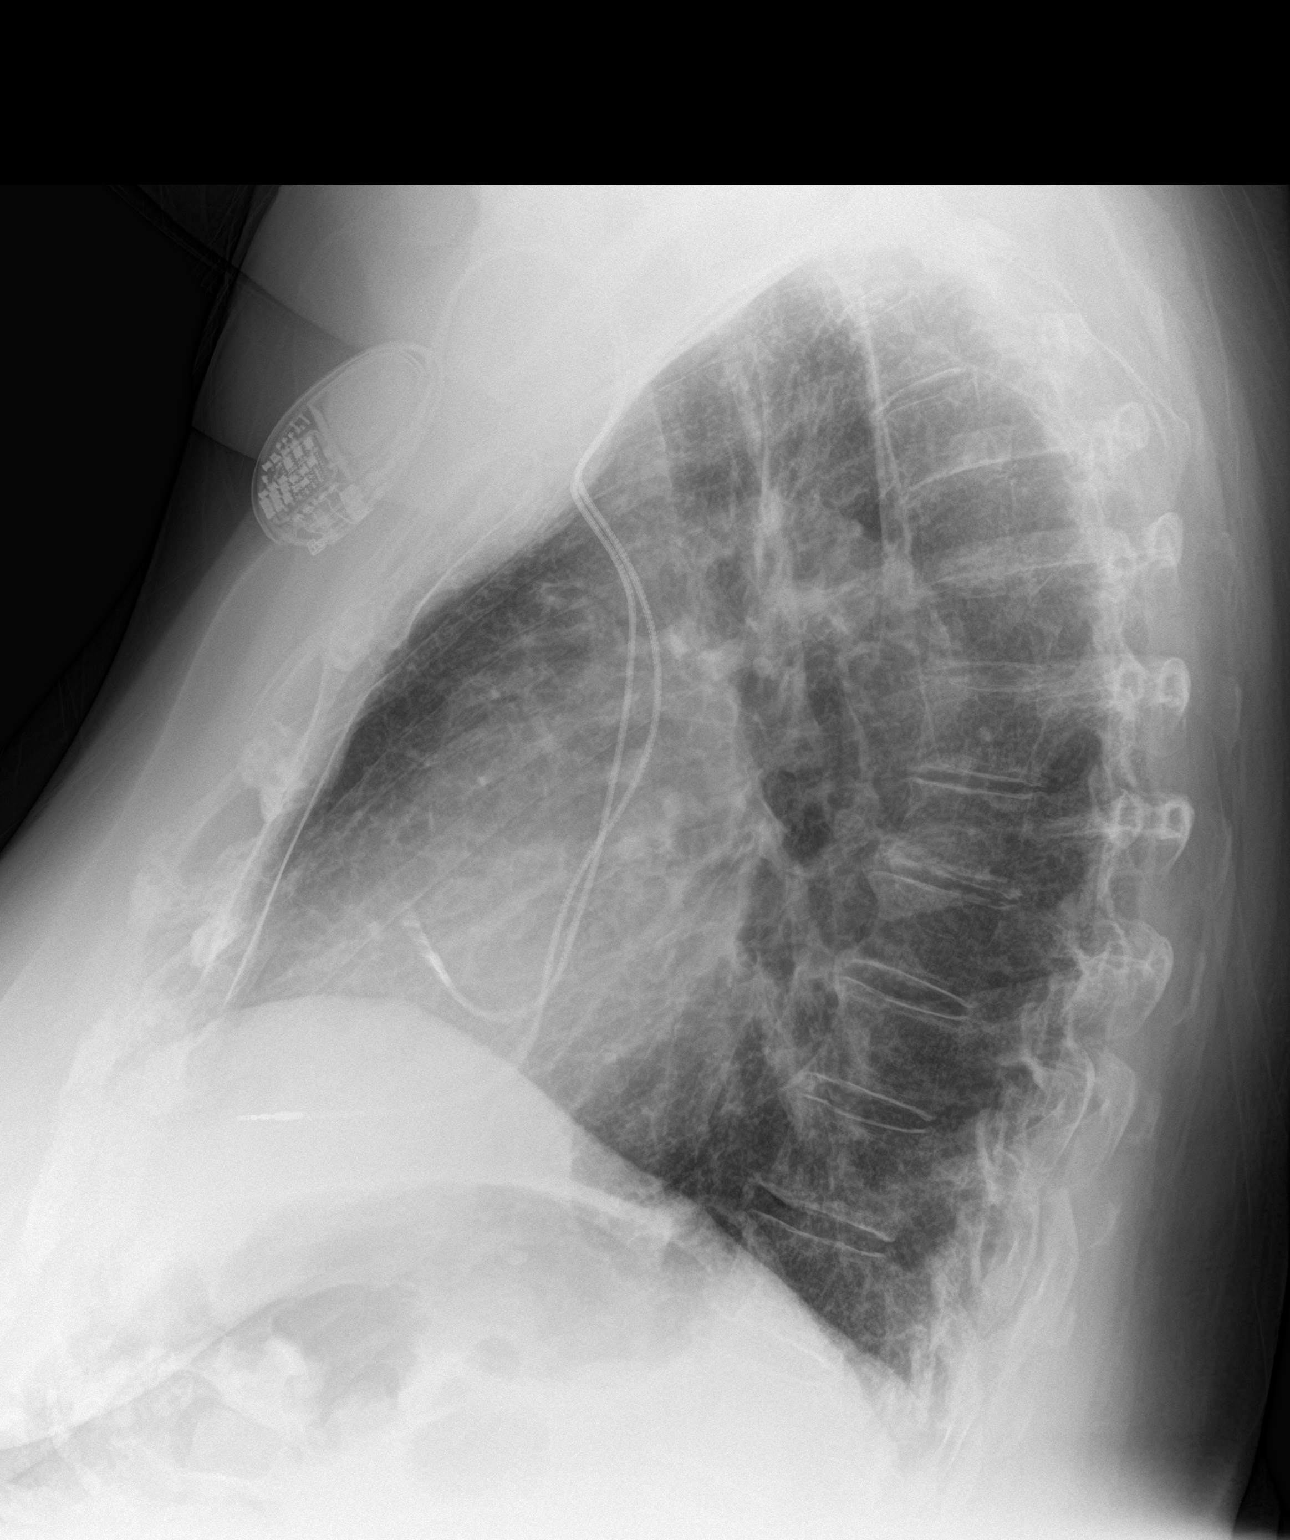

[2 of 2 positions shown; findings below may reference images not displayed]

FINDINGS: No active infiltrate or effusion is seen. The nodules noted on CT
and the fibrotic change at the lung bases are not well seen by chest
x-ray. Mediastinal and hilar contours are unremarkable and mild
cardiomegaly is stable. Permanent pacemaker remains. There are
degenerative changes in the thoracic spine.
IMPRESSION: No active lung disease. Stable chronic change and mild cardiomegaly
with permanent pacemaker.

## 2020-08-02 DIAGNOSIS — M6281 Muscle weakness (generalized): Secondary | ICD-10-CM | POA: Diagnosis not present

## 2020-08-02 DIAGNOSIS — S4392XA Sprain of unspecified parts of left shoulder girdle, initial encounter: Secondary | ICD-10-CM | POA: Diagnosis not present

## 2020-08-02 DIAGNOSIS — M25512 Pain in left shoulder: Secondary | ICD-10-CM | POA: Diagnosis not present

## 2020-08-02 DIAGNOSIS — R531 Weakness: Secondary | ICD-10-CM | POA: Diagnosis not present

## 2020-08-07 DIAGNOSIS — S4392XA Sprain of unspecified parts of left shoulder girdle, initial encounter: Secondary | ICD-10-CM | POA: Diagnosis not present

## 2020-08-07 DIAGNOSIS — R531 Weakness: Secondary | ICD-10-CM | POA: Diagnosis not present

## 2020-08-07 DIAGNOSIS — M6281 Muscle weakness (generalized): Secondary | ICD-10-CM | POA: Diagnosis not present

## 2020-08-07 DIAGNOSIS — M25512 Pain in left shoulder: Secondary | ICD-10-CM | POA: Diagnosis not present

## 2020-08-08 DIAGNOSIS — M25512 Pain in left shoulder: Secondary | ICD-10-CM | POA: Diagnosis not present

## 2020-08-08 DIAGNOSIS — R531 Weakness: Secondary | ICD-10-CM | POA: Diagnosis not present

## 2020-08-08 DIAGNOSIS — S4392XA Sprain of unspecified parts of left shoulder girdle, initial encounter: Secondary | ICD-10-CM | POA: Diagnosis not present

## 2020-08-08 DIAGNOSIS — M6281 Muscle weakness (generalized): Secondary | ICD-10-CM | POA: Diagnosis not present

## 2020-08-10 ENCOUNTER — Encounter: Payer: Self-pay | Admitting: Internal Medicine

## 2020-08-10 ENCOUNTER — Ambulatory Visit (INDEPENDENT_AMBULATORY_CARE_PROVIDER_SITE_OTHER): Payer: Medicare Other | Admitting: Internal Medicine

## 2020-08-10 ENCOUNTER — Other Ambulatory Visit: Payer: Self-pay

## 2020-08-10 ENCOUNTER — Telehealth: Payer: Self-pay | Admitting: Internal Medicine

## 2020-08-10 VITALS — BP 120/70 | HR 83 | Temp 98.0°F | Ht 67.0 in | Wt 262.8 lb

## 2020-08-10 DIAGNOSIS — M25512 Pain in left shoulder: Secondary | ICD-10-CM | POA: Diagnosis not present

## 2020-08-10 DIAGNOSIS — R0609 Other forms of dyspnea: Secondary | ICD-10-CM

## 2020-08-10 DIAGNOSIS — J849 Interstitial pulmonary disease, unspecified: Secondary | ICD-10-CM

## 2020-08-10 DIAGNOSIS — S4392XA Sprain of unspecified parts of left shoulder girdle, initial encounter: Secondary | ICD-10-CM | POA: Diagnosis not present

## 2020-08-10 DIAGNOSIS — R531 Weakness: Secondary | ICD-10-CM | POA: Diagnosis not present

## 2020-08-10 DIAGNOSIS — R0600 Dyspnea, unspecified: Secondary | ICD-10-CM

## 2020-08-10 DIAGNOSIS — M6281 Muscle weakness (generalized): Secondary | ICD-10-CM | POA: Diagnosis not present

## 2020-08-10 LAB — PULMONARY FUNCTION TEST
DL/VA % pred: 97 %
DL/VA: 3.9 ml/min/mmHg/L
DLCO cor % pred: 73 %
DLCO cor: 15.34 ml/min/mmHg
DLCO unc % pred: 76 %
DLCO unc: 15.83 ml/min/mmHg
FEF 25-75 Pre: 3.07 L/sec
FEF2575-%Pred-Pre: 187 %
FEV1-%Pred-Pre: 97 %
FEV1-Pre: 2.22 L
FEV1FVC-%Pred-Pre: 116 %
FEV6-%Pred-Pre: 89 %
FEV6-Pre: 2.57 L
FEV6FVC-%Pred-Pre: 105 %
FVC-%Pred-Pre: 84 %
FVC-Pre: 2.57 L
Pre FEV1/FVC ratio: 86 %
Pre FEV6/FVC Ratio: 100 %
RV % pred: 71 %
RV: 1.8 L
TLC % pred: 81 %
TLC: 4.5 L

## 2020-08-10 LAB — HEPATIC FUNCTION PANEL
ALT: 14 U/L (ref 0–35)
AST: 20 U/L (ref 0–37)
Albumin: 4.1 g/dL (ref 3.5–5.2)
Alkaline Phosphatase: 76 U/L (ref 39–117)
Bilirubin, Direct: 0.1 mg/dL (ref 0.0–0.3)
Total Bilirubin: 0.3 mg/dL (ref 0.2–1.2)
Total Protein: 7.4 g/dL (ref 6.0–8.3)

## 2020-08-10 NOTE — Progress Notes (Signed)
Full PFT completed today

## 2020-08-10 NOTE — Progress Notes (Signed)
e former smoker followed for dyspnea on exertion, lung nodules, fibrosis, complicated by A. 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 She does not qualify for portable oxygen CT chest 01/06/17-pulmonary fibrosis, small nodules ANA 1:160 PFT 07/19/2019- mild restriction. FVC 2.94/ 93%, FEV1 2.20/ 107%, R .86, FEF25-75% 3.48/ 110%, TLC 74%, DLCO 81%, no resp to BD ------------------------------------------------------------------------------------------  09/21/2018- 79 year old female former smoker followed for dyspnea on exertion, lung nodules, fibrosis, ANA 2:297, complicated by A. 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 Feeling Sob with activity today. Previously completed pulmonary rehab. Body weight 250 lbs No acute changes in her breathing.  Notices a full/bloated feeling especially after meals, but little wheeze or cough.  Still followed by cardiology for A. Fib/pacemaker. Gradually adjusting to loss of daughter who died from end-stage lung disease related to her alpha-1 antitrypsin status. CT chest 04/03/2018- IMPRESSION: 1. Pulmonary nodules are stable to slightly decreased back to 01/06/2017 chest CT, considered benign. 2. 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 No frank honeycombing. No convincing progression since 01/06/2017 high-resolution chest CT study. 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 Usual interstitial pneumonia is unlikely given absence of progression and presence of air trapping. 3. Stable mild patchy air trapping in  both lungs indicative of small airways disease. Aortic Atherosclerosis (ICD10-I70.0).  07/19/2019- 79 year old female former smoker followed for dyspnea on exertion, lung nodules, fibrosis, ANA 9:892, complicated by A. 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 TKR deferred due to Covid. Activity limited to ADLs and not losing weight.  Discussed her CT showing mild stable fibrosis. Also reviewed Echo and PFT. She is anxious, still grieving death of daughter from a1AT resp failure. She presses me to support trial of glutathione because it made her daughter "look better in the face". She wants to see Dr Chase Caller for his expertise re interstitial lung disease, although she is aware of mild chronic stable fibrosis and stable nodules.   PFT 07/19/2019- mild restriction. FVC 2.94/ 93%, FEV1 2.20/ 107%, R .86, FEF25-75% 3.48/ 110%, TLC 74%, DLCO 81%, no resp to BD  ECHO- 05/18/2019-  1. The left ventricle has low normal systolic function, with an ejection fraction of 50-55%. The cavity size was normal. Left ventricular diastolic function could not be evaluated.  2. The right ventricle has normal systolic function. The cavity was normal.  3. The mitral valve is grossly normal.  4. The tricuspid valve is grossly normal.  5. The aortic valve is tricuspid. Mild thickening of the aortic valve. No stenosis of the aortic valve.  6. Normal LV systolic function; no significant valvular heart disease.  CT chest 04/05/2019- IMPRESSION: No acute cardiopulmonary disease. Mild chronic stable fibrotic change. Several small subcentimeter mostly peripheral pulmonary nodules with the largest measuring 7 mm over the posterolateral right lower lobe. These are unchanged for greater than 2 years and therefore considered benign. Aortic Atherosclerosis (ICD10-I70.0).   OV  07/21/2019  Subjective:  [name], female , DOB: 06/18/1941 , age 47 y.o. , MRN: [id number] , ADDRESS:  [place] [id number]   07/21/2019 -   Chief Complaint  Patient presents with  . Follow-up    SOB worsening over past 6 months - increased weight gain     HPI [name] [name] 65 y.o. -is a friend ofl 1 of my pulmonary fibrosis patients.  She is also a patient of Dr. Baird Lyons in our clinic as described above.  She is here to the ILD clinic for second opinion about her pulmonary fibrosis.  She tells me that she was diagnosed with pulmonary fibrosis a few years ago and it has been stable.  Is been of insidious onset.  She had limited autoimmune profile as documented below in 2018 when she had a positive ANA but of low titer.  Her dyspnea is been stable.  She is dealing with other medical issues such as obesity, using a walker because of knee issues and a stroke and also death of her daughter from alpha-1 antitrypsin disease approximately 2 years ago.  She says she has some dyspnea on exertion relieved by rest but overall this is stable.  The dyspnea is definitely significant and out of proportion to the level of ILD as documented by her pulmonary function test which shows stability.  Her CT high-resolution that I was personally visualized from a year ago and his CT chest from 2020 showed bilateral bibasal reticular pattern but no honeycombing or traction bronchiectasis.  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  I do not see any features of alternate diagnosis.  She denies any autoimmune features.  She denies having mold in the house of pet birds.  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       OV 08/10/2020   Subjective:  [name], female , DOB: 14-Dec-1940, age 79 y.o. years. , MRN: [id number],  ADDRESS: [address] [place] [address] PCP  [name], MD Providers : Treatment Team:   Attending Provider: Brand Males, MD   Chief Complaint  Patient presents with  . 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  Indeterminate in 2020 CT scan.  Autoimmune negative except for ANA 1: 160.  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  [Daughter died from alpha-1 CC]  Obesity   Diastolic dysfunction  Atrial fibrillation on Eliquis  Obstructive sleep apnea on CPAP without oxygen   HPI [name] [name] 79 y.o. -last seen just over a year ago.  Since then not seen.  She tells me that she think she might be a little bit worse in terms of her shortness of breath than before.  But at the same time she says she is morbidly obese and has cardiac issues.  She also has shoulder rotator cuff tear issues.  She feels that she might be short of breath because of that.  In terms of ILD symptom questionnaire is documented below.  Symptoms are actually a little bit better than last year.  She had pulmonary function test and this is stable.  She had high-resolution CT chest.  Dr. 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  In terms of her walking desaturation test: desaturated -she desaturated the end of 1 lap.  She required 2 L of nasal cannula  to correct.  She said that after that she started feeling better.  She also reported that when she did physical therapy she felt as exhausted and sleeps a lot.   Noted that she is on polypharmacy has obesity, heart disease and is on Eliquis   SYMPTOM SCALE - ILD 07/21/2019  08/10/2020 262#  O2 use ra ra  Shortness of Breath 0 -> 5 scale with 5 being worst (score 6 If unable to do)   At rest 0 0  Simple tasks - showers, clothes change, eating, shaving 4 3  Household (dishes, doing bed, laundry) 4 3  Shopping 5 3  Walking level at own pace 5 3  Walking up Stairs 5 5  Total (40  - 48) Dyspnea Score 23 17  How bad is your cough? 1 1-3  How bad is your fatigue 4 3  nausea  0  vomit  0  diarrhea  0  axiety  2  depression  2     Simple office walk 185 feet x  3 laps goal with forehead probe 07/21/2019  08/10/2020   O2 used ra ra  Number laps completed 3 laps 3  Comments about pace modertae with walker Slow pace  Resting Pulse Ox/HR 97% and 78/min 100% and 74/min  Final Pulse Ox/HR 93% and 109/min 77% and 85/min  Desaturated </= 88% no no  Desaturated <= 3% points Yes, 4 Yes, 13 points  Got Tachycardic >/= 90/min you have pulmonary fibrosis otherwise call interstitial lung disease yes no  Symptoms at end of test dyspnea Mild dyspnea  Miscellaneous comments x Corrected with [facility] and got      PFT Results Latest Ref Rng & Units 08/10/2020 07/19/2019 02/24/2017  FVC-Pre L 2.57 2.74 2.58  FVC-Predicted Pre % 84 93 85  FVC-Post L - 2.63 2.45  FVC-Predicted Post % - 89 81  Pre FEV1/FVC % % 86 86 82  Post FEV1/FCV % % - 90 87  FEV1-Pre L 2.22 2.36 2.12  FEV1-Predicted Pre % 97 107 93  FEV1-Post L - 2.35 2.14  DLCO uncorrected ml/min/mmHg 15.83 16.52 16.15  DLCO UNC% % 76 81 59  DLCO corrected ml/min/mmHg 15.34 - 14.93  DLCO COR %Predicted % 73 - 55  DLVA Predicted % 97 104 79  TLC L 4.50 3.97 -  TLC % Predicted % 81 74 -  RV % Predicted % 71 52 -       Results for [name] [name], [name] [name] (MRN [id number]) as of 07/21/2019 15:57  Ref. Range 02/24/2017 15:55  Anti Nuclear Antibody (ANA) Latest Ref Range: NEGATIVE  POS (A)  ANA Pattern 1 Unknown HOMOGENEOUS (A)  ANA Titer 1 Latest Units: titer 1:160 (H)  RA Latex Turbid. Latest Ref Range: <14 IU/mL <14     CT chest high resolution June 2021   CLINICAL DATA:  79 year old female with history of interstitial lung disease.  EXAM: CT CHEST WITHOUT CONTRAST  TECHNIQUE: Multidetector CT imaging of the chest was performed following the standard protocol without intravenous contrast. High resolution imaging of  the lungs, as well as inspiratory and expiratory imaging, was performed.  COMPARISON:  Chest CT 04/05/2019.  FINDINGS: Cardiovascular: Heart size is normal. There is no significant pericardial fluid, thickening or pericardial calcification. There is aortic atherosclerosis, as well as atherosclerosis of the great vessels of the mediastinum and the coronary arteries, including calcified atherosclerotic plaque in the left circumflex coronary artery.  Mediastinum/Nodes: No pathologically enlarged mediastinal or hilar lymph nodes. Please note  that accurate exclusion of hilar adenopathy is limited on noncontrast CT scans. Small hiatal hernia. No axillary lymphadenopathy.  Lungs/Pleura: Multiple small pulmonary nodules are noted throughout the lungs bilaterally, largest of which is in the right lower lobe (axial image 160 of series 3) measuring 7 x 3 mm (mean diameter 5 mm). These are stable in size and number compared to prior examinations dating back to 2018. No other larger more suspicious appearing pulmonary nodules or masses are noted. No acute consolidative airspace disease. No pleural effusions. High-resolution images demonstrates some patchy areas with mild peripheral predominant ground-glass attenuation, septal thickening, some scattered areas of subpleural reticulation, peripheral predominant traction bronchiectasis and bronchiolectasis, and possible (but not definitive) early honeycombing most evident in the periphery of the left lower lobe. These findings have a definitive craniocaudal gradient and appear mildly progressive compared to the prior examination. Inspiratory and expiratory imaging demonstrates mild air trapping indicative of small airways disease.  Upper Abdomen: Aortic atherosclerosis. Calcified granuloma in the liver.  Musculoskeletal: There are no aggressive appearing lytic or blastic lesions noted in the visualized portions of the  skeleton.  IMPRESSION: 1. The appearance of the lungs is compatible with interstitial lung disease, which appears mildly progressive compared to the prior examination, with a spectrum of findings considered probable usual interstitial pneumonia (UIP) per current ATS guidelines. 2. Small pulmonary nodules measuring 5 mm or less in size, stable dating back to 2018, considered definitively benign. 3. Aortic atherosclerosis, in addition to left circumflex coronary artery disease. Please note that although the presence of coronary artery calcium documents the presence of coronary artery disease, the severity of this disease and any potential stenosis cannot be assessed on this non-gated CT examination. Assessment for potential risk factor modification, dietary therapy or pharmacologic therapy may be warranted, if clinically indicated.  Aortic Atherosclerosis (ICD10-I70.0).   Electronically Signed   By: Vinnie Langton M.D.   On: 06/01/2020 10:20  ROS - per HPI      has a past medical history of Alpha-1-antitrypsin deficiency carrier, Arthritis, Atherosclerosis of aorta (HCC), Atrial fibrillation ([place]), Back pain, Complication of anesthesia, Diastolic dysfunction, Frequent UTI, GERD (gastroesophageal reflux disease), Hypertension, Hypertriglyceridemia, Insulin resistance, Long term current use of anticoagulant, Multiple lung nodules, Muscular deconditioning, MVP (mitral valve prolapse), Obesity, OSA on CPAP, Osteoarthritis, Osteopenia, Pacemaker, PAF (paroxysmal atrial fibrillation) ([place]), Positive ANA (antinuclear antibody), Presbyesophagus, Presence of permanent cardiac pacemaker, Primary osteoarthritis of both hands, Pulmonary fibrosis ([place]), PVC's (premature ventricular contractions), Shortness of breath, Sinus arrest (10/2014), Sleep apnea, Stroke ([place]) (01/2014), Tachy-brady syndrome ([place]) (10/2014), TIA (transient ischemic attack), and Vitamin D deficiency.   reports that she  quit smoking about 50 years ago. Her smoking use included cigarettes. She has a 36.00 pack-year smoking history. She has never used smokeless tobacco.  Past Surgical History:  Procedure Laterality Date  . CARDIOVERSION N/A 09/02/2017   Procedure: CARDIOVERSION;  Surgeon: Sanda Klein, MD;  Location: [facility];  Service: Cardiovascular;  Laterality: N/A;  . COLONOSCOPY  2019  . ESOPHAGEAL DILATION    . EXCISION VAGINAL CYST     benign nodules  . INSERT / REPLACE / REMOVE PACEMAKER  10/04/2014   Medtronic Advisa model [id number] serial number [id number]  . LOOP RECORDER EXPLANT N/A 10/04/2014   Procedure: LOOP RECORDER EXPLANT;  Surgeon: Sanda Klein, MD;  Location: [facility];  Service: Cardiovascular;  Laterality: N/A;  . LOOP RECORDER IMPLANT N/A 04/19/2014   Procedure: LOOP RECORDER IMPLANT;  Surgeon: Sanda Klein, MD;  Location: [facility] CATH  LAB;  Service: Cardiovascular;  Laterality: N/A;  . NM MYOCAR PERF WALL MOTION  12/18/2007   normal  . PERMANENT PACEMAKER INSERTION N/A 10/04/2014   Procedure: PERMANENT PACEMAKER INSERTION;  Surgeon: Sanda Klein, MD;  Location: [facility] CATH LAB;  Service: Cardiovascular;  Laterality: N/A;  . TUBAL LIGATION    . US ECHOCARDIOGRAPHY  05/15/2010   LA mildly dilated,mild mitral annular ca+, AOV mildly sclerotic, mild asymmetric LVH    Allergies  Allergen Reactions  . Ancef [Cefazolin] Hives and Itching  . Pseudoephedrine Hcl     Other reaction(s): palpitations (moderate)  . Ergotamine   . Pentobarbital   . Caffeine Palpitations    Immunization History  Administered Date(s) Administered  . Influenza Whole 09/23/2008  . Influenza, High Dose Seasonal PF 09/13/2018, 09/27/2019  . Influenza,inj,quad, With Preservative 09/04/2017  . Influenza-Unspecified 09/17/2014, 09/09/2016, 09/04/2017  . Moderna SARS-COVID-2 Vaccination 12/06/2019, 01/03/2020  . Pneumococcal Conjugate-13 10/16/2015  . Pneumococcal Polysaccharide-23 10/02/2017  .  Pneumococcal-Unspecified 10/01/2014, 10/02/2017  . Zoster Recombinat (Shingrix) 09/29/2018, 01/19/2019    Family History  Problem Relation Age of Onset  . Heart attack Mother   . Sudden death Mother   . Stroke Father   . Heart failure Father   . Alcoholism Brother   . Healthy Sister   . Stroke Brother   . Alpha-1 antitrypsin deficiency Daughter   . Breast cancer Daughter   . Prostate cancer Brother   . Stroke Brother   . Prostate cancer Brother   . Healthy Brother   . Prostate cancer Brother   . Healthy Daughter   . Diabetes Son   . Arthritis Son      Current Outpatient Medications:  .  B Complex-C (SUPER B COMPLEX PO), Take 1 tablet by mouth daily., Disp: , Rfl:  .  Biotin 5000 MCG CAPS, Take 5,000 mcg by mouth daily. , Disp: , Rfl:  .  Calcium Carbonate-Vit D-Min (CALCIUM 1200 PO), Take 1,200 mg by mouth daily. , Disp: , Rfl:  .  cholecalciferol (VITAMIN D) 1000 UNITS tablet, Take 6,000 Units by mouth daily. , Disp: , Rfl:  .  Co-Enzyme Q10 100 MG CAPS, Take 100 mg by mouth daily. , Disp: , Rfl:  .  diazepam (VALIUM) 5 MG tablet, Take one tablet by mouth once daily, Disp: 30 tablet, Rfl: 0 .  ELIQUIS 5 MG TABS tablet, TAKE 1 TABLET BY MOUTH TWICE DAILY., Disp: 180 tablet, Rfl: 1 .  Flaxseed, Linseed, (FLAX SEED OIL PO), Take 1,400 mg by mouth daily., Disp: , Rfl:  .  fluticasone (FLONASE) 50 MCG/ACT nasal spray, Place 2 sprays into both nostrils as needed for allergies or rhinitis., Disp: , Rfl:  .  folic acid (FOLVITE) 161 MCG tablet, Take 400 mcg by mouth daily., Disp: , Rfl:  .  glucosamine-chondroitin 500-400 MG tablet, Take 1 tablet by mouth 2 (two) times daily. , Disp: , Rfl:  .  loratadine (KLS ALLERCLEAR) 10 MG tablet, Take 10 mg by mouth daily., Disp: , Rfl:  .  MAGNESIUM CITRATE PO, Take 400 mg by mouth daily. 1 tab daily, Disp: , Rfl:  .  nitroGLYCERIN (NITROSTAT) 0.4 MG SL tablet, Place 0.4 mg under the tongue every 5 (five) minutes as needed for chest pain.,  Disp: , Rfl:  .  Omega-3 Fatty Acids (FISH OIL) 1000 MG CPDR, Take 1,000 mg by mouth daily. , Disp: , Rfl:  .  omeprazole (PRILOSEC) 20 MG capsule, Take 40 mg by mouth daily. , Disp: ,  Rfl:  .  Resveratrol 250 MG CAPS, Take 250 mg by mouth daily., Disp: , Rfl:  .  sertraline (ZOLOFT) 100 MG tablet, TAKE 1 TABLET EACH DAY., Disp: 90 tablet, Rfl: 0 .  TURMERIC PO, Take 1,000 mg by mouth daily., Disp: , Rfl:       Objective:   Vitals:   08/10/20 1011  BP: 120/70  Pulse: 83  Temp: 98 F (36.7 C)  TempSrc: Oral  SpO2: 95%  Weight: 262 lb 12.8 oz (119.2 kg)  Height: _0  (1.702 m)    Estimated body mass index is 41.16 kg/m as calculated from the following:   Height as of this encounter: _1  (1.702 m).   Weight as of this encounter: 262 lb 12.8 oz (119.2 kg).  _2 @  [employer]   08/10/20 1011  Weight: 262 lb 12.8 oz (119.2 kg)     Physical Exam  General Appearance:    Alert, cooperative, no distress, appears stated age - yes , Deconditioned looking - yes , OBESE  - yes, Sitting on Wheelchair -  no  Head:    Normocephalic, without obvious abnormality, atraumatic  Eyes:    PERRL, conjunctiva/corneas clear,  Ears:    Normal TM's and external ear canals, both ears  Nose:   Nares normal, septum midline, mucosa normal, no drainage    or sinus tenderness. OXYGEN ON  - no . Patient is @ ra   Throat:   Lips, mucosa, and tongue normal; teeth and gums normal. Cyanosis on lips - no  Neck:   Supple, symmetrical, trachea midline, no adenopathy;    thyroid:  no enlargement/tenderness/nodules; no carotid   bruit or JVD  Back:     Symmetric, no curvature, ROM normal, no CVA tenderness  Lungs:     Distress - no , Wheeze no, Barrell Chest - no, Purse lip breathing - no, Crackles - no   Chest Wall:    No tenderness or deformity.    Heart:    Regular rate and rhythm, S1 and S2 normal, no rub   or gallop, Murmur - no  Breast Exam:    NOT DONE  Abdomen:     Soft, non-tender,  bowel sounds active all four quadrants,    no masses, no organomegaly. Visceral obesity - yes  Genitalia:   NOT DONE  Rectal:   NOT DONE  Extremities:   Extremities - normal, Has Cane - no, Clubbing - no, Edema - no  Pulses:   2+ and symmetric all extremities  Skin:   Stigmata of Connective Tissue Disease - no  Lymph nodes:   Cervical, supraclavicular, and axillary nodes normal  Psychiatric:  Neurologic:   Pleasant - yes, Anxious - no, Flat affect - no  CAm-ICU - neg, Alert and Oriented x 3 - yes, Moves all 4s - yes, Speech - normal, Cognition - intact           Assessment:       ICD-10-CM   1. ILD (interstitial lung disease) ([place])  J84.9   2. Dyspnea on exertion  R06.00    I think there is evidence of progression based on desaturation and based on radiology description although symptom wise she think she is worse but_she is the same.  It is hard to discern.  On pulmonary function test may be she is stable.  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  Therefore antibiotics is indicated.  We discussed extensively in detail.  Given the fact she is on anticoagulation nintedanib would be second line.  Decided to go with pirfenidone.  Discussed the side effects benefits and risk.  She is willing to start.  We will start on portable oxygen.  She burst into tears because this reminded her of her daughter.  We will also check over now on room air    Plan:     Patient Instructions     ICD-10-CM   1. ILD (interstitial lung disease) ([place])  J84.9   2. 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  I think is slightly progressive compared to last year  Plan -Start 2 L oxygen portable with exertion -Test overnight oxygen on room air while using his CPAP -Start pirfenidone [Esbriet] per protocol  -I do not recommend nintedanib at this stage because of the fact you are on Eliquis that you had heart disease  -We discussed side effects, benefits and limitations with Esbriet including nausea vomiting weight loss anorexia and need to monitor liver function test closely and the need to apply sunscreen  --CMA to initiate paperwork for me to sign  -Refer to our pharmacy team for initial counseling  -Stop fish oil [this can make acid reflux worse particularly in the presence of small hiatal hernia which you have -Stop flaxseed oil [you can take this is flaxseed seeds] -Continue Prilosec 40 mg daily -potentially taken on empty stomach -At follow-up we will discuss other pillars of management -  -At some point in the future we will administer interstitial lung disease questionnaire  Follow-up -6 weeks with Dr. Chase Caller or nurse practitioner to monitor progress      ( Level 05 visit: Estb 40-54 min  in  visit type: on-site physical face to visit  in total care time and counseling or/and coordination of care by this undersigned MD - Dr Brand Males. This includes one or more of the following on this same day 08/10/2020: pre-charting, chart review, note writing, documentation discussion of test results, diagnostic or treatment recommendations, prognosis, risks and benefits of management options, instructions, education, compliance or risk-factor reduction. It excludes time spent by the [place] or office staff in the care of the patient. Actual time 48 min)    SIGNATURE    Dr. Brand Males, M.D., F.C.C.P,  Pulmonary and Critical Care Medicine Staff Physician, [place] Director - Interstitial Lung Disease  Program  [facility] [place], [place], [address]  Pager: [phone], If no answer or between  15:00h - 7:00h: call [phone] Telephone: [phone]  12:09 PM 08/10/2020

## 2020-08-10 NOTE — Patient Instructions (Addendum)
ICD-10-CM   1. ILD (interstitial lung disease) ([place])  J84.9   2. 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  I think is slightly progressive compared to last year  Plan -Start 2 L oxygen portable with exertion -Test overnight oxygen on room air while using his CPAP -Start pirfenidone [Esbriet] per protocol  -I do not recommend nintedanib at this stage because of the fact you are on Eliquis that you had heart disease  -We discussed side effects, benefits and limitations with Esbriet including nausea vomiting weight loss anorexia and need to monitor liver function test closely and the need to apply sunscreen  --CMA to initiate paperwork for me to sign  -Refer to our pharmacy team for initial counseling  -Stop fish oil [this can make acid reflux worse particularly in the presence of small hiatal hernia which you have -Stop flaxseed oil [you can take this is flaxseed seeds] -Continue Prilosec 40 mg daily -potentially taken on empty stomach -At follow-up we will discuss other pillars of management -  -At some point in the future we will administer interstitial lung disease questionnaire  Follow-up -6 weeks with Dr. Chase Caller or nurse practitioner to monitor progress

## 2020-08-11 NOTE — Progress Notes (Signed)
LFT normal . Ok to start anti fibrotic.Normal result and we will not be calling you wit this  Lab             08/10/20                      1225         AST          20           ALT          14           ALKPHOS      76           BILITOT      0.3          PROT         7.4          ALBUMIN      4.1

## 2020-08-14 ENCOUNTER — Telehealth: Payer: Self-pay | Admitting: Internal Medicine

## 2020-08-14 ENCOUNTER — Telehealth: Payer: Self-pay | Admitting: Pharmacist

## 2020-08-14 ENCOUNTER — Other Ambulatory Visit: Payer: Self-pay

## 2020-08-14 DIAGNOSIS — G4733 Obstructive sleep apnea (adult) (pediatric): Secondary | ICD-10-CM

## 2020-08-14 DIAGNOSIS — S4392XA Sprain of unspecified parts of left shoulder girdle, initial encounter: Secondary | ICD-10-CM | POA: Diagnosis not present

## 2020-08-14 DIAGNOSIS — M6281 Muscle weakness (generalized): Secondary | ICD-10-CM | POA: Diagnosis not present

## 2020-08-14 DIAGNOSIS — R531 Weakness: Secondary | ICD-10-CM | POA: Diagnosis not present

## 2020-08-14 DIAGNOSIS — M25512 Pain in left shoulder: Secondary | ICD-10-CM | POA: Diagnosis not present

## 2020-08-14 NOTE — Telephone Encounter (Signed)
Submitted a Prior Authorization request to [employer] for [place] via Cover My Meds. Will update once we receive a response.   KeyAnastasia Pall - PA Case ID: [id number]  Patient has Medicare and will likely need PAP.

## 2020-08-14 NOTE — Progress Notes (Signed)
HPI  Patient presents today to [facility] for Initial appt with pharmacy team for [place] counseling. Pertinent past medical history includes IPF, OSA, GERD, atrial fibrillation, and history of stroke. She is naive to anti-fibrotic therapy.  She is currently on Eliquis.  OBJECTIVE Allergies  Allergen Reactions  . Ancef [Cefazolin] Hives and Itching  . Pseudoephedrine Hcl     Other reaction(s): palpitations (moderate)  . Ergotamine   . Pentobarbital   . Caffeine Palpitations    Outpatient Encounter Medications as of 08/15/2020  Medication Sig  . B Complex-C (SUPER B COMPLEX PO) Take 1 tablet by mouth daily.  . Biotin 5000 MCG CAPS Take 5,000 mcg by mouth daily.   . Calcium Carbonate-Vit D-Min (CALCIUM 1200 PO) Take 1,200 mg by mouth daily.   . cholecalciferol (VITAMIN D) 1000 UNITS tablet Take 6,000 Units by mouth daily.   Marland Kitchen Co-Enzyme Q10 100 MG CAPS Take 100 mg by mouth daily.   . diazepam (VALIUM) 5 MG tablet Take one tablet by mouth once daily  . ELIQUIS 5 MG TABS tablet TAKE 1 TABLET BY MOUTH TWICE DAILY.  Marland Kitchen Flaxseed, Linseed, (FLAX SEED OIL PO) Take 1,400 mg by mouth daily.  . fluticasone (FLONASE) 50 MCG/ACT nasal spray Place 2 sprays into both nostrils as needed for allergies or rhinitis.  . folic acid (FOLVITE) 032 MCG tablet Take 400 mcg by mouth daily.  Marland Kitchen glucosamine-chondroitin 500-400 MG tablet Take 1 tablet by mouth 2 (two) times daily.   Marland Kitchen loratadine (KLS ALLERCLEAR) 10 MG tablet Take 10 mg by mouth daily.  Marland Kitchen MAGNESIUM CITRATE PO Take 400 mg by mouth daily. 1 tab daily  . nitroGLYCERIN (NITROSTAT) 0.4 MG SL tablet Place 0.4 mg under the tongue every 5 (five) minutes as needed for chest pain.  . Omega-3 Fatty Acids (FISH OIL) 1000 MG CPDR Take 1,000 mg by mouth daily.   Marland Kitchen omeprazole (PRILOSEC) 20 MG capsule Take 40 mg by mouth daily.   Marland Kitchen Resveratrol 250 MG CAPS Take 250 mg by mouth daily.  . sertraline (ZOLOFT) 100 MG tablet TAKE 1 TABLET EACH DAY.  Marland Kitchen TURMERIC  PO Take 1,000 mg by mouth daily.   No facility-administered encounter medications on file as of 08/15/2020.     Immunization History  Administered Date(s) Administered  . Influenza Whole 09/23/2008  . Influenza, High Dose Seasonal PF 09/13/2018, 09/27/2019  . Influenza,inj,quad, With Preservative 09/04/2017  . Influenza-Unspecified 09/17/2014, 09/09/2016, 09/04/2017  . Moderna SARS-COVID-2 Vaccination 12/06/2019, 01/03/2020  . Pneumococcal Conjugate-13 10/16/2015  . Pneumococcal Polysaccharide-23 10/02/2017  . Pneumococcal-Unspecified 10/01/2014, 10/02/2017  . Zoster Recombinat (Shingrix) 09/29/2018, 01/19/2019     PFT's TLC  Date Value Ref Range Status  08/10/2020 4.50 L Preliminary     CMP     Component Value Date/Time   NA 141 07/28/2020 0913   NA 138 07/21/2019 1444   K 4.3 07/28/2020 0913   CL 105 07/28/2020 0913   CO2 28 07/28/2020 0913   GLUCOSE 111 (H) 07/28/2020 0913   BUN 25 07/28/2020 0913   BUN 26 07/21/2019 1444   CREATININE 1.09 (H) 07/28/2020 0913   CALCIUM 9.0 07/28/2020 0913   PROT 7.4 08/10/2020 1225   PROT 6.5 07/21/2019 1444   ALBUMIN 4.1 08/10/2020 1225   ALBUMIN 4.1 07/21/2019 1444   AST 20 08/10/2020 1225   ALT 14 08/10/2020 1225   ALKPHOS 76 08/10/2020 1225   BILITOT 0.3 08/10/2020 1225   BILITOT 0.3 07/21/2019 1444   GFRNONAA 48 (L) 07/28/2020  0913   GFRAA 56 (L) 07/28/2020 0913     CBC    Component Value Date/Time   WBC 8.5 07/28/2020 0913   RBC 4.93 07/28/2020 0913   HGB 14.5 07/28/2020 0913   HGB 14.1 07/21/2019 1444   HCT 43.2 07/28/2020 0913   HCT 41.9 07/21/2019 1444   PLT 260 07/28/2020 0913   PLT 275 07/21/2019 1444   MCV 87.6 07/28/2020 0913   MCV 85 07/21/2019 1444   MCH 29.4 07/28/2020 0913   MCHC 33.6 07/28/2020 0913   RDW 12.6 07/28/2020 0913   RDW 12.5 07/21/2019 1444   LYMPHSABS 2,618 07/28/2020 0913   LYMPHSABS 2.0 09/17/2017 1131   MONOABS 1.0 10/14/2018 1028   EOSABS 391 07/28/2020 0913   EOSABS 0.2  09/17/2017 1131   BASOSABS 60 07/28/2020 0913   BASOSABS 0.0 09/17/2017 1131     LFT's Hepatic Function Latest Ref Rng & Units 08/10/2020 07/28/2020 03/13/2020  Total Protein 6.0 - 8.3 g/dL 7.4 6.6 6.7  Albumin 3.5 - 5.2 g/dL 4.1 - -  AST 0 - 37 U/L _0 ALT 0 - 35 U/L _1 Alk Phosphatase 39 - 117 U/L 76 - -  Total Bilirubin 0.2 - 1.2 mg/dL 0.3 0.5 0.4  Bilirubin, Direct 0.0 - 0.3 mg/dL 0.1 - -     ASSESSMENT  1. Esbriet Medication Management  Patient seen in office by Dr. Chase Caller on 08/10/2020 and decision made to start antifibrotic.  Esbriet was chosen as patient is currently on Eliquis which can cause increased bleeding risk with Ofev.  Patient counseled on purpose, proper use, and potential adverse effects including nausea, vomiting, abdominal pain, GERD, weight loss, arthralgia, dizziness, and suns sensitivity/rash.  Stressed the importance of routine lab monitoring. Will monitor LFT's every month for the first 6 months of treatment then every 3 months. Will monitor CBC every 3 months.  Starting dose will be Esbriet 267 mg 1 tablet three times daily for 7 days, then 2 tablets three times daily for 7 days, then 3 tablets three times daily.  Maintenance dose will be 801 mg 1 tablet three times daily if tolerated.  Stressed the importance of taking with meals to minimize stomach upset.    Insurance approved Esbriet capsules.  Per plan, patient must fill through [place]. Currently no PF grants open. Patient brought completed patient assistance applications to appointment.  Will have provider fill out provider portion and fax for approval.  We will update patient when we receive a response.    2. Medication Reconciliation  A drug regimen assessment was performed, including review of allergies, interactions, disease-state management, dosing and immunization history. Medications were reviewed with the patient, including name, instructions, indication, goals of  therapy, potential side effects, importance of adherence, and safe use.  Drug interaction(s): no major drug interactions   3. Immunizations  Patient is up to date with pneumonia, shingles, and Covid-19 vaccines.  Recommend annual influenza.   PLAN  Start Esbriet titration once approved for patient assistance.  Hepatic panel monthly for the first 6 months of therapy then every 3 months.  Standing order placed.  CBC every 3 months.  Standing order placed.  All questions encouraged and answered.  Instructed patient to call with any further questions or concerns.  Thank you for allowing pharmacy to participate in this patient's care.  This appointment required  55 minutes of patient care (this includes precharting, chart review, review of results, face-to-face care, etc.).  Mariella Saa, PharmD, [place], CPP Clinical Specialty Pharmacist (Rheumatology and Pulmonology)  08/15/2020 2:54 PM

## 2020-08-14 NOTE — Telephone Encounter (Signed)
Patient has appointment with pharmacy tomorrow to review [place].  We have not received new start paperwork.  Please start benefits investigation for Esbriet for the treatment of IPF.     Mariella Saa, PharmD, [place], CPP Clinical Specialty Pharmacist (Rheumatology and Pulmonology)  08/14/2020 1:21 PM

## 2020-08-14 NOTE — Telephone Encounter (Signed)
Spoke with patient regarding POC.Advised patient I would need to send in a order to [employer] regarding [employer] POC. Patient is aware and voice was understanding. Nothing else further needed.

## 2020-08-15 ENCOUNTER — Ambulatory Visit: Payer: Medicare Other | Admitting: Pharmacist

## 2020-08-15 ENCOUNTER — Other Ambulatory Visit: Payer: Self-pay

## 2020-08-15 DIAGNOSIS — R531 Weakness: Secondary | ICD-10-CM | POA: Diagnosis not present

## 2020-08-15 DIAGNOSIS — M6281 Muscle weakness (generalized): Secondary | ICD-10-CM | POA: Diagnosis not present

## 2020-08-15 DIAGNOSIS — S4392XA Sprain of unspecified parts of left shoulder girdle, initial encounter: Secondary | ICD-10-CM | POA: Diagnosis not present

## 2020-08-15 DIAGNOSIS — M25512 Pain in left shoulder: Secondary | ICD-10-CM | POA: Diagnosis not present

## 2020-08-15 DIAGNOSIS — Z5181 Encounter for therapeutic drug level monitoring: Secondary | ICD-10-CM

## 2020-08-15 DIAGNOSIS — J84112 Idiopathic pulmonary fibrosis: Secondary | ICD-10-CM

## 2020-08-15 MED ORDER — ESBRIET 267 MG PO CAPS: ORAL_CAPSULE | ORAL | 0 refills | Status: DC

## 2020-08-15 NOTE — Patient Instructions (Signed)
Thank you for meeting with the pharmacy team today!  Below find a summary of what we discussed at your visit: . We will apply for Esbriet patient assistance and update you when you receive a response. Marland Kitchen You should receive a call from [facility] nurse educator. . Once starting Esbriet, come to the office for lab work every month for 6 months o No appointment needed.  Lab hours 8:30-1pm and 2-5pm  Call [phone] with any questions or concerns.   Mariella Saa, PharmD, [place], CPP Clinical Specialty Pharmacist (Rheumatology and Pulmonology)  08/15/2020 2:06 PM

## 2020-08-15 NOTE — Telephone Encounter (Signed)
Patient brought application forms to appointment today.  Placed in provider box for signatures.   Mariella Saa, PharmD, [place], CPP Clinical Specialty Pharmacist (Rheumatology and Pulmonology)  08/15/2020 2:28 PM

## 2020-08-15 NOTE — Telephone Encounter (Signed)
Received denial for Esbriet tablets, as plan prefers capsules.  Received notification from [place] regarding a prior authorization for ESBRIET CAPS. Authorization has been APPROVED from 07/16/20 to 08/15/21.   Authorization # [id number] Phone # [phone]  Per plan, patient must fill through [place]. Currently no PF grants open, will likely need to apply for PAP.

## 2020-08-16 ENCOUNTER — Other Ambulatory Visit: Payer: Self-pay | Admitting: Cardiovascular Disease

## 2020-08-16 NOTE — Telephone Encounter (Signed)
° ° °

## 2020-08-16 NOTE — Telephone Encounter (Signed)
Spoke with the pt and advised we do not have any samples of Eliquis but will call her if the Rep delivers any later today. Pt said to just leave her a message.

## 2020-08-17 NOTE — Telephone Encounter (Signed)
Received signed provider forms. Faxed to [employer]. Will update once we receive a response.  Phone# [phone]

## 2020-08-17 NOTE — Telephone Encounter (Signed)
Samples placed up front for pick up. Pt updated.   Eliquis 5 mg Qty: 2 boxes Lot # [id number] Exp: 8/23

## 2020-08-21 DIAGNOSIS — R531 Weakness: Secondary | ICD-10-CM | POA: Diagnosis not present

## 2020-08-21 DIAGNOSIS — M25512 Pain in left shoulder: Secondary | ICD-10-CM | POA: Diagnosis not present

## 2020-08-21 DIAGNOSIS — S4392XA Sprain of unspecified parts of left shoulder girdle, initial encounter: Secondary | ICD-10-CM | POA: Diagnosis not present

## 2020-08-21 DIAGNOSIS — M6281 Muscle weakness (generalized): Secondary | ICD-10-CM | POA: Diagnosis not present

## 2020-08-22 ENCOUNTER — Telehealth: Payer: Self-pay | Admitting: Internal Medicine

## 2020-08-22 DIAGNOSIS — R531 Weakness: Secondary | ICD-10-CM | POA: Diagnosis not present

## 2020-08-22 DIAGNOSIS — M25512 Pain in left shoulder: Secondary | ICD-10-CM | POA: Diagnosis not present

## 2020-08-22 DIAGNOSIS — J84112 Idiopathic pulmonary fibrosis: Secondary | ICD-10-CM

## 2020-08-22 DIAGNOSIS — S4392XA Sprain of unspecified parts of left shoulder girdle, initial encounter: Secondary | ICD-10-CM | POA: Diagnosis not present

## 2020-08-22 DIAGNOSIS — M6281 Muscle weakness (generalized): Secondary | ICD-10-CM | POA: Diagnosis not present

## 2020-08-22 MED ORDER — ESBRIET 267 MG PO CAPS: ORAL_CAPSULE | ORAL | 0 refills | Status: DC

## 2020-08-22 NOTE — Telephone Encounter (Signed)
Spoke with patient regarding Esbriet sent into patient's pharmacy [facility] . Patient is aware and voice was understanding.Nothing else further needed.

## 2020-08-24 DIAGNOSIS — M25512 Pain in left shoulder: Secondary | ICD-10-CM | POA: Diagnosis not present

## 2020-08-24 DIAGNOSIS — Z6841 Body Mass Index (BMI) 40.0 and over, adult: Secondary | ICD-10-CM | POA: Diagnosis not present

## 2020-08-24 DIAGNOSIS — F331 Major depressive disorder, recurrent, moderate: Secondary | ICD-10-CM | POA: Diagnosis not present

## 2020-08-24 DIAGNOSIS — M6281 Muscle weakness (generalized): Secondary | ICD-10-CM | POA: Diagnosis not present

## 2020-08-24 DIAGNOSIS — S4392XA Sprain of unspecified parts of left shoulder girdle, initial encounter: Secondary | ICD-10-CM | POA: Diagnosis not present

## 2020-08-24 DIAGNOSIS — R531 Weakness: Secondary | ICD-10-CM | POA: Diagnosis not present

## 2020-08-24 NOTE — Telephone Encounter (Signed)
[facility], they are still working on benefits investigation. They were unable to verify benefits over the phone with [employer]. They are working with [facility] to determine patient's copay and then will initiate patient assistance.

## 2020-08-28 DIAGNOSIS — S4392XA Sprain of unspecified parts of left shoulder girdle, initial encounter: Secondary | ICD-10-CM | POA: Diagnosis not present

## 2020-08-28 DIAGNOSIS — R531 Weakness: Secondary | ICD-10-CM | POA: Diagnosis not present

## 2020-08-28 DIAGNOSIS — M25512 Pain in left shoulder: Secondary | ICD-10-CM | POA: Diagnosis not present

## 2020-08-28 DIAGNOSIS — M6281 Muscle weakness (generalized): Secondary | ICD-10-CM | POA: Diagnosis not present

## 2020-08-29 DIAGNOSIS — R531 Weakness: Secondary | ICD-10-CM | POA: Diagnosis not present

## 2020-08-29 DIAGNOSIS — M6281 Muscle weakness (generalized): Secondary | ICD-10-CM | POA: Diagnosis not present

## 2020-08-29 DIAGNOSIS — S4392XA Sprain of unspecified parts of left shoulder girdle, initial encounter: Secondary | ICD-10-CM | POA: Diagnosis not present

## 2020-08-29 DIAGNOSIS — M25512 Pain in left shoulder: Secondary | ICD-10-CM | POA: Diagnosis not present

## 2020-08-31 DIAGNOSIS — R531 Weakness: Secondary | ICD-10-CM | POA: Diagnosis not present

## 2020-08-31 DIAGNOSIS — M25512 Pain in left shoulder: Secondary | ICD-10-CM | POA: Diagnosis not present

## 2020-08-31 DIAGNOSIS — M6281 Muscle weakness (generalized): Secondary | ICD-10-CM | POA: Diagnosis not present

## 2020-08-31 DIAGNOSIS — S4392XA Sprain of unspecified parts of left shoulder girdle, initial encounter: Secondary | ICD-10-CM | POA: Diagnosis not present

## 2020-08-31 NOTE — Telephone Encounter (Signed)
Application was approved yesterday (08/30/20).

## 2020-09-04 DIAGNOSIS — Z950 Presence of cardiac pacemaker: Secondary | ICD-10-CM | POA: Diagnosis not present

## 2020-09-04 DIAGNOSIS — S4292XD Fracture of left shoulder girdle, part unspecified, subsequent encounter for fracture with routine healing: Secondary | ICD-10-CM | POA: Diagnosis not present

## 2020-09-04 DIAGNOSIS — M6281 Muscle weakness (generalized): Secondary | ICD-10-CM | POA: Diagnosis not present

## 2020-09-04 DIAGNOSIS — R531 Weakness: Secondary | ICD-10-CM | POA: Diagnosis not present

## 2020-09-04 DIAGNOSIS — M25512 Pain in left shoulder: Secondary | ICD-10-CM | POA: Diagnosis not present

## 2020-09-05 ENCOUNTER — Telehealth: Payer: Self-pay | Admitting: Internal Medicine

## 2020-09-05 ENCOUNTER — Telehealth: Payer: Self-pay | Admitting: Cardiovascular Disease

## 2020-09-05 DIAGNOSIS — R531 Weakness: Secondary | ICD-10-CM | POA: Diagnosis not present

## 2020-09-05 DIAGNOSIS — M25512 Pain in left shoulder: Secondary | ICD-10-CM | POA: Diagnosis not present

## 2020-09-05 DIAGNOSIS — Z950 Presence of cardiac pacemaker: Secondary | ICD-10-CM | POA: Diagnosis not present

## 2020-09-05 DIAGNOSIS — S4292XD Fracture of left shoulder girdle, part unspecified, subsequent encounter for fracture with routine healing: Secondary | ICD-10-CM | POA: Diagnosis not present

## 2020-09-05 DIAGNOSIS — M6281 Muscle weakness (generalized): Secondary | ICD-10-CM | POA: Diagnosis not present

## 2020-09-05 NOTE — Telephone Encounter (Signed)
Spoke with the patient and advised her that she could pick up the samples tomorrow. Patient verbalized understanding.

## 2020-09-05 NOTE — Telephone Encounter (Signed)
Patient called to follow up. She states she went outside to start her car and it wouldn't start. She would like to make sure that our office is able to hold onto the Eliquis samples for her. She states she may have transportation arranged to pick them up tomorrow.

## 2020-09-05 NOTE — Telephone Encounter (Signed)
Patient called in to office requesting samples of eliquis. Advised patient that 3 boxes were placed at the front desk for pick up. Patient verbalized understanding.   Medication Samples have been provided to the patient.  Drug name: Eliquis     Strength: 68m      Qty: 3 boxes  LOT: [id number] Exp.Date: 7/23  Dosing instructions: Take 538mtwice daily   The patient has been instructed regarding the correct time, dose, and frequency of taking this medication, including desired effects and most common side effects.   JeBelinda Blockullins 9:57 AM 09/05/2020

## 2020-09-05 NOTE — Telephone Encounter (Signed)
° ° °  Patient calling the office for samples of medication:   1.  What medication and dosage are you requesting samples for? ELIQUIS 5 MG TABS tablet  2.  Are you currently out of this medication? yes

## 2020-09-05 NOTE — Telephone Encounter (Signed)
6-minute walk test done by [employer] for oxygen titration.  This was done on September 28th 2021.  No distance was recorded.  Leak test was done on 2 L nasal cannula.  At rest patient was 95% with a heart rate of 88.  6 minutes later saturation was 92% with a heart rate of 102.  The patient has been advised to 2 L nasal cannula.

## 2020-09-07 DIAGNOSIS — M25512 Pain in left shoulder: Secondary | ICD-10-CM | POA: Diagnosis not present

## 2020-09-07 DIAGNOSIS — S4292XD Fracture of left shoulder girdle, part unspecified, subsequent encounter for fracture with routine healing: Secondary | ICD-10-CM | POA: Diagnosis not present

## 2020-09-07 DIAGNOSIS — R531 Weakness: Secondary | ICD-10-CM | POA: Diagnosis not present

## 2020-09-07 DIAGNOSIS — M6281 Muscle weakness (generalized): Secondary | ICD-10-CM | POA: Diagnosis not present

## 2020-09-07 DIAGNOSIS — Z950 Presence of cardiac pacemaker: Secondary | ICD-10-CM | POA: Diagnosis not present

## 2020-09-11 DIAGNOSIS — S4292XD Fracture of left shoulder girdle, part unspecified, subsequent encounter for fracture with routine healing: Secondary | ICD-10-CM | POA: Diagnosis not present

## 2020-09-11 DIAGNOSIS — R531 Weakness: Secondary | ICD-10-CM | POA: Diagnosis not present

## 2020-09-11 DIAGNOSIS — Z950 Presence of cardiac pacemaker: Secondary | ICD-10-CM | POA: Diagnosis not present

## 2020-09-11 DIAGNOSIS — M6281 Muscle weakness (generalized): Secondary | ICD-10-CM | POA: Diagnosis not present

## 2020-09-11 DIAGNOSIS — M25512 Pain in left shoulder: Secondary | ICD-10-CM | POA: Diagnosis not present

## 2020-09-13 DIAGNOSIS — M6281 Muscle weakness (generalized): Secondary | ICD-10-CM | POA: Diagnosis not present

## 2020-09-13 DIAGNOSIS — S4292XD Fracture of left shoulder girdle, part unspecified, subsequent encounter for fracture with routine healing: Secondary | ICD-10-CM | POA: Diagnosis not present

## 2020-09-13 DIAGNOSIS — R531 Weakness: Secondary | ICD-10-CM | POA: Diagnosis not present

## 2020-09-13 DIAGNOSIS — Z950 Presence of cardiac pacemaker: Secondary | ICD-10-CM | POA: Diagnosis not present

## 2020-09-13 DIAGNOSIS — M25512 Pain in left shoulder: Secondary | ICD-10-CM | POA: Diagnosis not present

## 2020-09-18 ENCOUNTER — Other Ambulatory Visit: Payer: Self-pay | Admitting: Internal Medicine

## 2020-09-18 DIAGNOSIS — M25512 Pain in left shoulder: Secondary | ICD-10-CM | POA: Diagnosis not present

## 2020-09-18 DIAGNOSIS — S4292XD Fracture of left shoulder girdle, part unspecified, subsequent encounter for fracture with routine healing: Secondary | ICD-10-CM | POA: Diagnosis not present

## 2020-09-18 DIAGNOSIS — M67912 Unspecified disorder of synovium and tendon, left shoulder: Secondary | ICD-10-CM | POA: Diagnosis not present

## 2020-09-18 DIAGNOSIS — M6281 Muscle weakness (generalized): Secondary | ICD-10-CM | POA: Diagnosis not present

## 2020-09-18 DIAGNOSIS — Z950 Presence of cardiac pacemaker: Secondary | ICD-10-CM | POA: Diagnosis not present

## 2020-09-18 DIAGNOSIS — R531 Weakness: Secondary | ICD-10-CM | POA: Diagnosis not present

## 2020-09-18 NOTE — Telephone Encounter (Signed)
Dr.Gupta or Mickey Farber please advise if this is a [facility] patient. The pcp is listed as [name], MD

## 2020-09-19 ENCOUNTER — Telehealth: Payer: Self-pay | Admitting: Cardiovascular Disease

## 2020-09-19 ENCOUNTER — Other Ambulatory Visit: Payer: Self-pay | Admitting: Cardiovascular Disease

## 2020-09-19 DIAGNOSIS — Z950 Presence of cardiac pacemaker: Secondary | ICD-10-CM | POA: Diagnosis not present

## 2020-09-19 DIAGNOSIS — M6281 Muscle weakness (generalized): Secondary | ICD-10-CM | POA: Diagnosis not present

## 2020-09-19 DIAGNOSIS — S4292XD Fracture of left shoulder girdle, part unspecified, subsequent encounter for fracture with routine healing: Secondary | ICD-10-CM | POA: Diagnosis not present

## 2020-09-19 DIAGNOSIS — R531 Weakness: Secondary | ICD-10-CM | POA: Diagnosis not present

## 2020-09-19 DIAGNOSIS — M25512 Pain in left shoulder: Secondary | ICD-10-CM | POA: Diagnosis not present

## 2020-09-19 MED ORDER — NITROGLYCERIN 0.4 MG SL SUBL: 0.4000 mg | SUBLINGUAL_TABLET | SUBLINGUAL | 1 refills | Status: DC | PRN

## 2020-09-19 NOTE — Telephone Encounter (Signed)
The patient has been made aware.   Medication Samples have been provided to the patient.  Drug name: Eliquis       Strength: 5 mg        Qty: 2 boxes  LOT: [id number]  Exp.Date: 7/23

## 2020-09-19 NOTE — Telephone Encounter (Signed)
*  STAT* If patient is at the pharmacy, call can be transferred to refill team.   1. Which medications need to be refilled? (please list name of each medication and dose if known)  nitroGLYCERIN (NITROSTAT) 0.4 MG SL tablet  2. Which pharmacy/location (including street and city if local pharmacy) is medication to be sent to? [place], [place]  3. Do they need a 30 day or 90 day supply? 90 day supply

## 2020-09-19 NOTE — Telephone Encounter (Signed)
Rx has been sent to the pharmacy electronically.

## 2020-09-19 NOTE — Telephone Encounter (Signed)
Patient calling the office for samples of medication:   1.  What medication and dosage are you requesting samples for? ELIQUIS 5 MG TABS tablet  2.  Are you currently out of this medication? No

## 2020-09-24 ENCOUNTER — Other Ambulatory Visit: Payer: Self-pay | Admitting: Internal Medicine

## 2020-09-24 DIAGNOSIS — F331 Major depressive disorder, recurrent, moderate: Secondary | ICD-10-CM

## 2020-09-25 DIAGNOSIS — Z950 Presence of cardiac pacemaker: Secondary | ICD-10-CM | POA: Diagnosis not present

## 2020-09-25 DIAGNOSIS — R531 Weakness: Secondary | ICD-10-CM | POA: Diagnosis not present

## 2020-09-25 DIAGNOSIS — M25512 Pain in left shoulder: Secondary | ICD-10-CM | POA: Diagnosis not present

## 2020-09-25 DIAGNOSIS — S4292XD Fracture of left shoulder girdle, part unspecified, subsequent encounter for fracture with routine healing: Secondary | ICD-10-CM | POA: Diagnosis not present

## 2020-09-25 DIAGNOSIS — M6281 Muscle weakness (generalized): Secondary | ICD-10-CM | POA: Diagnosis not present

## 2020-09-26 ENCOUNTER — Other Ambulatory Visit: Payer: Self-pay

## 2020-09-26 ENCOUNTER — Telehealth: Payer: Self-pay | Admitting: Cardiovascular Disease

## 2020-09-26 ENCOUNTER — Ambulatory Visit (INDEPENDENT_AMBULATORY_CARE_PROVIDER_SITE_OTHER): Payer: Medicare Other | Admitting: Internal Medicine

## 2020-09-26 ENCOUNTER — Encounter: Payer: Self-pay | Admitting: Internal Medicine

## 2020-09-26 VITALS — BP 120/72 | HR 92 | Temp 97.9°F | Ht 67.0 in | Wt 266.6 lb

## 2020-09-26 DIAGNOSIS — Z23 Encounter for immunization: Secondary | ICD-10-CM

## 2020-09-26 DIAGNOSIS — Z7185 Encounter for immunization safety counseling: Secondary | ICD-10-CM | POA: Diagnosis not present

## 2020-09-26 DIAGNOSIS — R040 Epistaxis: Secondary | ICD-10-CM

## 2020-09-26 DIAGNOSIS — Z5181 Encounter for therapeutic drug level monitoring: Secondary | ICD-10-CM | POA: Diagnosis not present

## 2020-09-26 DIAGNOSIS — J84112 Idiopathic pulmonary fibrosis: Secondary | ICD-10-CM | POA: Diagnosis not present

## 2020-09-26 LAB — HEPATIC FUNCTION PANEL
ALT: 13 U/L (ref 0–35)
AST: 17 U/L (ref 0–37)
Albumin: 3.9 g/dL (ref 3.5–5.2)
Alkaline Phosphatase: 83 U/L (ref 39–117)
Bilirubin, Direct: 0.1 mg/dL (ref 0.0–0.3)
Total Bilirubin: 0.4 mg/dL (ref 0.2–1.2)
Total Protein: 7 g/dL (ref 6.0–8.3)

## 2020-09-26 NOTE — Telephone Encounter (Signed)
error

## 2020-09-26 NOTE — Progress Notes (Signed)
LFT normal  Lab             09/26/20                      1406         AST          17           ALT          13           ALKPHOS      83           BILITOT      0.4          PROT         7.0          ALBUMIN      3.9

## 2020-09-26 NOTE — Patient Instructions (Addendum)
ICD-10-CM   1. IPF (idiopathic pulmonary fibrosis) ([place])  J84.112   2. Medication monitoring encounter  Z51.81   3. Vaccine counseling  Z71.85   4. Flu vaccine need  Z23   5. 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 I  Ordered it last time - Check LFT 09/26/2020 - high dose flu shot 09/26/2020 - covid booster in 1-2 weeks after flu shot - saline nasal spray as needed for dry nose -Continue pirfenidone [Esbriet] per protocol -Continue Prilosec 40 mg daily -potentially taken on empty stomach -At follow-up we will discuss other pillars of management -  -At some point in the future we will administer interstitial lung disease questionnaire  Follow-up -8 weeks with Dr. Chase Caller 30 min face to face visit

## 2020-09-26 NOTE — Progress Notes (Signed)
e former smoker followed for dyspnea on exertion, lung nodules, fibrosis, complicated by A. 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 She does not qualify for portable oxygen CT chest 01/06/17-pulmonary fibrosis, small nodules ANA 1:160 PFT 07/19/2019- mild restriction. FVC 2.94/ 93%, FEV1 2.20/ 107%, R .86, FEF25-75% 3.48/ 110%, TLC 74%, DLCO 81%, no resp to BD ------------------------------------------------------------------------------------------  09/21/2018- 79 year old female former smoker followed for dyspnea on exertion, lung nodules, fibrosis, ANA 2:297, complicated by A. 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 Feeling Sob with activity today. Previously completed pulmonary rehab. Body weight 250 lbs No acute changes in her breathing.  Notices a full/bloated feeling especially after meals, but little wheeze or cough.  Still followed by cardiology for A. Fib/pacemaker. Gradually adjusting to loss of daughter who died from end-stage lung disease related to her alpha-1 antitrypsin status. CT chest 04/03/2018- IMPRESSION: 1. Pulmonary nodules are stable to slightly decreased back to 01/06/2017 chest CT, considered benign. 2. 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 No frank honeycombing. No convincing progression since 01/06/2017 high-resolution chest CT study. 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 Usual interstitial pneumonia is unlikely given absence of progression and presence of air trapping. 3. Stable mild patchy air trapping in  both lungs indicative of small airways disease. Aortic Atherosclerosis (ICD10-I70.0).  07/19/2019- 79 year old female former smoker followed for dyspnea on exertion, lung nodules, fibrosis, ANA 9:892, complicated by A. 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 TKR deferred due to Covid. Activity limited to ADLs and not losing weight.  Discussed her CT showing mild stable fibrosis. Also reviewed Echo and PFT. She is anxious, still grieving death of daughter from a1AT resp failure. She presses me to support trial of glutathione because it made her daughter "look better in the face". She wants to see Dr Chase Caller for his expertise re interstitial lung disease, although she is aware of mild chronic stable fibrosis and stable nodules.   PFT 07/19/2019- mild restriction. FVC 2.94/ 93%, FEV1 2.20/ 107%, R .86, FEF25-75% 3.48/ 110%, TLC 74%, DLCO 81%, no resp to BD  ECHO- 05/18/2019-  1. The left ventricle has low normal systolic function, with an ejection fraction of 50-55%. The cavity size was normal. Left ventricular diastolic function could not be evaluated.  2. The right ventricle has normal systolic function. The cavity was normal.  3. The mitral valve is grossly normal.  4. The tricuspid valve is grossly normal.  5. The aortic valve is tricuspid. Mild thickening of the aortic valve. No stenosis of the aortic valve.  6. Normal LV systolic function; no significant valvular heart disease.  CT chest 04/05/2019- IMPRESSION: No acute cardiopulmonary disease. Mild chronic stable fibrotic change. Several small subcentimeter mostly peripheral pulmonary nodules with the largest measuring 7 mm over the posterolateral right lower lobe. These are unchanged for greater than 2 years and therefore considered benign. Aortic Atherosclerosis (ICD10-I70.0).   OV  07/21/2019  Subjective:  [name], female , DOB: 06/18/1941 , age 79 y.o. , MRN: [id number] , ADDRESS:  [place] [id number]   07/21/2019 -   Chief Complaint  Patient presents with  . Follow-up    SOB worsening over past 6 months - increased weight gain     HPI [name] [name] 79 y.o. -is a friend ofl 1 of my pulmonary fibrosis patients.  She is also a patient of Dr. Baird Lyons in our clinic as described above.  She is here to the ILD clinic for second opinion about her pulmonary fibrosis.  She tells me that she was diagnosed with pulmonary fibrosis a few years ago and it has been stable.  Is been of insidious onset.  She had limited autoimmune profile as documented below in 2018 when she had a positive ANA but of low titer.  Her dyspnea is been stable.  She is dealing with other medical issues such as obesity, using a walker because of knee issues and a stroke and also death of her daughter from alpha-1 antitrypsin disease approximately 2 years ago.  She says she has some dyspnea on exertion relieved by rest but overall this is stable.  The dyspnea is definitely significant and out of proportion to the level of ILD as documented by her pulmonary function test which shows stability.  Her CT high-resolution that I was personally visualized from a year ago and his CT chest from 2020 showed bilateral bibasal reticular pattern but no honeycombing or traction bronchiectasis.  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  I do not see any features of alternate diagnosis.  She denies any autoimmune features.  She denies having mold in the house of pet birds.  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       OV 08/10/2020   Subjective:  [name], female , DOB: 14-Dec-1940, age 79 y.o. years. , MRN: [id number],  ADDRESS: [address] [place] [address] PCP  [name], MD Providers : Treatment Team:   Attending Provider: Brand Males, MD   Chief Complaint  Patient presents with  . 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  Indeterminate in 2020 CT scan.  Autoimmune negative except for ANA 1: 160.  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  [Daughter died from alpha-1 CC]  Obesity   Diastolic dysfunction  Atrial fibrillation on Eliquis  Obstructive sleep apnea on CPAP without oxygen   HPI LEIDI ASTLE 79 y.o. -last seen just over a year ago.  Since then not seen.  She tells me that she think she might be a little bit worse in terms of her shortness of breath than before.  But at the same time she says she is morbidly obese and has cardiac issues.  She also has shoulder rotator cuff tear issues.  She feels that she might be short of breath because of that.  In terms of ILD symptom questionnaire is documented below.  Symptoms are actually a little bit better than last year.  She had pulmonary function test and this is stable.  She had high-resolution CT chest.  Dr. 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  In terms of her walking desaturation test: desaturated -she desaturated the end of 1 lap.  She required 2 L of nasal cannula  to correct.  She said that after that she started feeling better.  She also reported that when she did physical therapy she felt as exhausted and sleeps a lot.   Noted that she is on polypharmacy has obesity, heart disease and is on Eliquis   OV 09/26/2020   Subjective:  [name], female , DOB: [birth year], age 26 y.o. years. , MRN: [id number],  ADDRESS: [address] [place] [address] PCP  Virgie Dad, MD Providers : Treatment Team:  Attending Provider: Brand Males, MD Patient Care Team: Virgie Dad, MD as PCP - General  (Internal Medicine) Sanda Klein, MD as PCP - Cardiology (Cardiology) Maisie Fus, MD as Consulting Physician (Obstetrics and Gynecology) Richmond Campbell, MD as Consulting Physician (Gastroenterology) Croitoru, Dani Gobble, MD as Consulting Physician (Cardiology) Kathie Rhodes, MD (Inactive) as Consulting Physician (Urology) Danella Sensing, MD as Consulting Physician (Dermatology) Brand Males, MD as Consulting Physician (Pulmonary Disease) Bo Merino, MD as Consulting Physician (Rheumatology) Barbaraann Cao, OD as Referring Physician (Optometry) Berenice Primas, MD as Referring Physician (Orthopedic Surgery)    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  Indeterminate in 01/18/2019 CT scan.  Autoimmune negative except for ANA 1: 160.  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  [Daughter died from alpha-1 CC]  Obesity   Diastolic dysfunction  Atrial fibrillation on Eliquis  Obstructive sleep apnea on CPAP without oxygen  Chief Complaint  Patient presents with  . Follow-up    ILD, still SOB, cannot get O2 from [employer]       HPI JAIDON SPONSEL 79 y.o. -returns for follow-up of IPF.  This visit is to make sure that pirfenidone start has gone well.  She says she is now into 4 weeks of pirfenidone.  She is taking 3 pills 3 times daily for the last 1 week.  So far no GI site issues.  Her baseline symptom profile includes fatigue and it is stable.  Dyspnea stable.  She is frustrated by the lack of having portable oxygen system.  She says the oxygen DME company is yet to provide her.  Apparently my office did not do the right paperwork but she thinks it is the DME  company.  Nevertheless she wants to try another DME company.  She also has some occasional mild intermittent epistaxis by using her oxygen which dries her nose up.  Otherwise overall stable.  She is inquiring about the Covid booster.  She has not yet had a flu shot but will have it today.    SYMPTOM SCALE - ILD 07/21/2019  08/10/2020 262# 09/26/2020 Last Weight  Most recent update: 09/26/2020  1:42 PM   Weight  120.9 kg (266 lb 9.6 oz)             O2 use ra ra ra  Shortness of Breath 0 -> 5 scale with 5 being worst (score 6 If unable to do)    At rest 0 0   Simple tasks - showers, clothes change, eating, shaving 4 3   Household (dishes, doing bed, laundry) 4 3   Shopping 5 3   Walking level at own  pace 5 3   Walking up Stairs 5 5   Total (40 - 48) Dyspnea Score 23 17   How bad is your cough? 1 1-3 1  How bad is your fatigue _0 nausea  0 0  vomit  0 0  diarrhea  0 0  axiety  2 1  depression  2 1.5     Simple office walk 185 feet x  3 laps goal with forehead probe 07/21/2019  08/10/2020   O2 used ra ra  Number laps completed 3 laps 3  Comments about pace modertae with walker Slow pace  Resting Pulse Ox/HR 97% and 78/min 100% and 74/min  Final Pulse Ox/HR 93% and 109/min 77% and 85/min  Desaturated </= 88% no no  Desaturated <= 3% points Yes, 4 Yes, 13 points  Got Tachycardic >/= 90/min you have pulmonary fibrosis otherwise call interstitial lung disease yes no  Symptoms at end of test dyspnea Mild dyspnea  Miscellaneous comments x Corrected with [facility] and got    PFT Results Latest Ref Rng & Units 08/10/2020 07/19/2019 02/24/2017  FVC-Pre L 2.57 2.74 2.58  FVC-Predicted Pre % 84 93 85  FVC-Post L - 2.63 2.45  FVC-Predicted Post % - 89 81  Pre FEV1/FVC % % 86 86 82  Post FEV1/FCV % % - 90 87  FEV1-Pre L 2.22 2.36 2.12  FEV1-Predicted Pre % 97 107 93  FEV1-Post L - 2.35 2.14  DLCO uncorrected ml/min/mmHg 15.83 16.52 16.15  DLCO UNC% % 76 81 59  DLCO corrected  ml/min/mmHg 15.34 - 14.93  DLCO COR %Predicted % 73 - 55  DLVA Predicted % 97 104 79  TLC L 4.50 3.97 -  TLC % Predicted % 81 74 -  RV % Predicted % 71 52 -       Results for [name] [name], [name] [name] (MRN [id number]) as of 07/21/2019 15:57  Ref. Range 02/24/2017 15:55  Anti Nuclear Antibody (ANA) Latest Ref Range: NEGATIVE  POS (A)  ANA Pattern 1 Unknown HOMOGENEOUS (A)  ANA Titer 1 Latest Units: titer 1:160 (H)  RA Latex Turbid. Latest Ref Range: <14 IU/mL <14     CT chest high resolution June 2021   CLINICAL DATA:  79 year old female with history of interstitial lung disease.  EXAM: CT CHEST WITHOUT CONTRAST  TECHNIQUE: Multidetector CT imaging of the chest was performed following the standard protocol without intravenous contrast. High resolution imaging of the lungs, as well as inspiratory and expiratory imaging, was performed.  COMPARISON:  Chest CT 04/05/2019.  FINDINGS: Cardiovascular: Heart size is normal. There is no significant pericardial fluid, thickening or pericardial calcification. There is aortic atherosclerosis, as well as atherosclerosis of the great vessels of the mediastinum and the coronary arteries, including calcified atherosclerotic plaque in the left circumflex coronary artery.  Mediastinum/Nodes: No pathologically enlarged mediastinal or hilar lymph nodes. Please note that accurate exclusion of hilar adenopathy is limited on noncontrast CT scans. Small hiatal hernia. No axillary lymphadenopathy.  Lungs/Pleura: Multiple small pulmonary nodules are noted throughout the lungs bilaterally, largest of which is in the right lower lobe (axial image 160 of series 3) measuring 7 x 3 mm (mean diameter 5 mm). These are stable in size and number compared to prior examinations dating back to 2018. No other larger more suspicious appearing pulmonary nodules or masses are noted. No acute consolidative airspace disease. No pleural effusions. High-resolution  images demonstrates some patchy areas with mild peripheral predominant ground-glass attenuation, septal thickening, some scattered  areas of subpleural reticulation, peripheral predominant traction bronchiectasis and bronchiolectasis, and possible (but not definitive) early honeycombing most evident in the periphery of the left lower lobe. These findings have a definitive craniocaudal gradient and appear mildly progressive compared to the prior examination. Inspiratory and expiratory imaging demonstrates mild air trapping indicative of small airways disease.  Upper Abdomen: Aortic atherosclerosis. Calcified granuloma in the liver.  Musculoskeletal: There are no aggressive appearing lytic or blastic lesions noted in the visualized portions of the skeleton.  IMPRESSION: 1. The appearance of the lungs is compatible with interstitial lung disease, which appears mildly progressive compared to the prior examination, with a spectrum of findings considered probable usual interstitial pneumonia (UIP) per current ATS guidelines. 2. Small pulmonary nodules measuring 5 mm or less in size, stable dating back to 2018, considered definitively benign. 3. Aortic atherosclerosis, in addition to left circumflex coronary artery disease. Please note that although the presence of coronary artery calcium documents the presence of coronary artery disease, the severity of this disease and any potential stenosis cannot be assessed on this non-gated CT examination. Assessment for potential risk factor modification, dietary therapy or pharmacologic therapy may be warranted, if clinically indicated.  Aortic Atherosclerosis (ICD10-I70.0).   Electronically Signed   By: Vinnie Langton M.D.   On: 06/01/2020 10:20  ROS - per HPI       has a past medical history of Alpha-1-antitrypsin deficiency carrier, Arthritis, Atherosclerosis of aorta (HCC), Atrial fibrillation ([place]), Back pain, Complication of  anesthesia, Diastolic dysfunction, Frequent UTI, GERD (gastroesophageal reflux disease), Hypertension, Hypertriglyceridemia, Insulin resistance, Long term current use of anticoagulant, Multiple lung nodules, Muscular deconditioning, MVP (mitral valve prolapse), Obesity, OSA on CPAP, Osteoarthritis, Osteopenia, Pacemaker, PAF (paroxysmal atrial fibrillation) ([place]), Positive ANA (antinuclear antibody), Presbyesophagus, Presence of permanent cardiac pacemaker, Primary osteoarthritis of both hands, Pulmonary fibrosis ([place]), PVC's (premature ventricular contractions), Shortness of breath, Sinus arrest (10/2014), Sleep apnea, Stroke ([place]) (01/2014), Tachy-brady syndrome ([place]) (10/2014), TIA (transient ischemic attack), and Vitamin D deficiency.   reports that she quit smoking about 50 years ago. Her smoking use included cigarettes. She has a 36.00 pack-year smoking history. She has never used smokeless tobacco.  Past Surgical History:  Procedure Laterality Date  . CARDIOVERSION N/A 09/02/2017   Procedure: CARDIOVERSION;  Surgeon: Sanda Klein, MD;  Location: [facility];  Service: Cardiovascular;  Laterality: N/A;  . COLONOSCOPY  2019  . ESOPHAGEAL DILATION    . EXCISION VAGINAL CYST     benign nodules  . INSERT / REPLACE / REMOVE PACEMAKER  10/04/2014   [employer] Advisa model [id number] serial number [id number]  . LOOP RECORDER EXPLANT N/A 10/04/2014   Procedure: LOOP RECORDER EXPLANT;  Surgeon: Sanda Klein, MD;  Location: [facility];  Service: Cardiovascular;  Laterality: N/A;  . LOOP RECORDER IMPLANT N/A 04/19/2014   Procedure: LOOP RECORDER IMPLANT;  Surgeon: Sanda Klein, MD;  Location: [facility];  Service: Cardiovascular;  Laterality: N/A;  . NM MYOCAR PERF WALL MOTION  12/18/2007   normal  . PERMANENT PACEMAKER INSERTION N/A 10/04/2014   Procedure: PERMANENT PACEMAKER INSERTION;  Surgeon: Sanda Klein, MD;  Location: [facility];  Service: Cardiovascular;  Laterality: N/A;  . TUBAL LIGATION     . US ECHOCARDIOGRAPHY  05/15/2010   LA mildly dilated,mild mitral annular ca+, AOV mildly sclerotic, mild asymmetric LVH    Allergies  Allergen Reactions  . Ancef [Cefazolin] Hives and Itching  . Pseudoephedrine Hcl     Other reaction(s): palpitations (moderate)  . Ergotamine   .  Pentobarbital   . Caffeine Palpitations    Immunization History  Administered Date(s) Administered  . Influenza Whole 09/23/2008  . Influenza, High Dose Seasonal PF 09/13/2018, 09/27/2019  . Influenza,inj,quad, With Preservative 09/04/2017  . Influenza-Unspecified 09/17/2014, 09/09/2016, 09/04/2017  . Moderna SARS-COVID-2 Vaccination 12/06/2019, 01/03/2020  . Pneumococcal Conjugate-13 10/16/2015  . Pneumococcal Polysaccharide-23 10/02/2017  . Pneumococcal-Unspecified 10/01/2014, 10/02/2017  . Zoster Recombinat (Shingrix) 09/29/2018, 01/19/2019    Family History  Problem Relation Age of Onset  . Heart attack Mother   . Sudden death Mother   . Stroke Father   . Heart failure Father   . Alcoholism Brother   . Healthy Sister   . Stroke Brother   . Alpha-1 antitrypsin deficiency Daughter   . Breast cancer Daughter   . Prostate cancer Brother   . Stroke Brother   . Prostate cancer Brother   . Healthy Brother   . Prostate cancer Brother   . Healthy Daughter   . Diabetes Son   . Arthritis Son      Current Outpatient Medications:  .  B Complex-C (SUPER B COMPLEX PO), Take 1 tablet by mouth daily., Disp: , Rfl:  .  Biotin 5000 MCG CAPS, Take 5,000 mcg by mouth daily. , Disp: , Rfl:  .  Calcium Carbonate-Vit D-Min (CALCIUM 1200 PO), Take 1,200 mg by mouth daily. , Disp: , Rfl:  .  cholecalciferol (VITAMIN D) 1000 UNITS tablet, Take 6,000 Units by mouth daily. , Disp: , Rfl:  .  Co-Enzyme Q10 100 MG CAPS, Take 100 mg by mouth daily. , Disp: , Rfl:  .  diazepam (VALIUM) 5 MG tablet, TAKE 1 TABLET EACH DAY., Disp: 30 tablet, Rfl: 0 .  ELIQUIS 5 MG TABS tablet, TAKE 1 TABLET BY MOUTH TWICE DAILY.,  Disp: 180 tablet, Rfl: 1 .  fluticasone (FLONASE) 50 MCG/ACT nasal spray, Place 2 sprays into both nostrils as needed for allergies or rhinitis., Disp: , Rfl:  .  folic acid (FOLVITE) 240 MCG tablet, Take 400 mcg by mouth daily., Disp: , Rfl:  .  glucosamine-chondroitin 500-400 MG tablet, Take 1 tablet by mouth 2 (two) times daily. , Disp: , Rfl:  .  loratadine (KLS ALLERCLEAR) 10 MG tablet, Take 10 mg by mouth daily., Disp: , Rfl:  .  MAGNESIUM CITRATE PO, Take 400 mg by mouth daily. 1 tab daily, Disp: , Rfl:  .  nitroGLYCERIN (NITROSTAT) 0.4 MG SL tablet, Place 1 tablet (0.4 mg total) under the tongue every 5 (five) minutes as needed for chest pain., Disp: 25 tablet, Rfl: 1 .  omeprazole (PRILOSEC) 20 MG capsule, Take 40 mg by mouth daily. , Disp: , Rfl:  .  Pirfenidone (ESBRIET PO), Take by mouth., Disp: , Rfl:  .  Resveratrol 250 MG CAPS, Take 250 mg by mouth daily., Disp: , Rfl:  .  sertraline (ZOLOFT) 100 MG tablet, TAKE 1 TABLET EACH DAY., Disp: 90 tablet, Rfl: 0 .  TURMERIC PO, Take 1,000 mg by mouth daily., Disp: , Rfl:       Objective:   Vitals:   09/26/20 1341  BP: 120/72  Pulse: 92  Temp: 97.9 F (36.6 C)  SpO2: 91%  Weight: 266 lb 9.6 oz (120.9 kg)  Height: 5' 7" (1.702 m)    Estimated body mass index is 41.76 kg/m as calculated from the following:   Height as of this encounter: 5' 7" (1.702 m).   Weight as of this encounter: 266 lb 9.6 oz (120.9 kg).  _0 @  Filed Weights   09/26/20 1341  Weight: 266 lb 9.6 oz (120.9 kg)     Physical Exam   General: No distress. obese Neuro: Alert and Oriented x 3. GCS 15. Speech normal Psych: Pleasant Resp:  Barrel Chest - no.  Wheeze - no, Crackles - mild base, No overt respiratory distress CVS: Normal heart sounds. Murmurs - no Ext: Stigmata of Connective Tissue Disease - n HEENT: Normal upper airway. PEERL +. No post nasal drip        Assessment:       ICD-10-CM   1. IPF (idiopathic pulmonary  fibrosis) ([place])  J84.112   2. Medication monitoring encounter  Z51.81   3. Vaccine counseling  Z71.85   4. Flu vaccine need  Z23   5. Epistaxis  R04.0        Plan:     Patient Instructions     ICD-10-CM   1. IPF (idiopathic pulmonary fibrosis) ([place])  J84.112   2. Medication monitoring encounter  Z51.81   3. Vaccine counseling  Z71.85   4. Flu vaccine need  Z23   5. Epistaxis  R04.0     IPF (idiopathic pulmonary fibrosis) (HCC)  Your pulmonary fibrosis otherwise call interstitial lung disease  Based on age greater than 59, description of small hiatal hernia on CT scan, the type of pattern on the CT scan called probable UIP, autoimmune antibodies negative last year -I would call this a variety years IPF  I think is slightly progressive compared to last year  Glad you are tolerating esbriet well  Too bad [employer] not able to get you portable oxygen system  Some nose bleeds from o2 drying up your nose  Glad you stopped fish oil and flaxseed oil  Plan -Start 2 L oxygen portable with exertion  - do repeat qualifying walk and we can try another company -Test overnight oxygen on room air while using his CPAP  -did you do this? I  Ordered it last time - Check LFT 09/26/2020 - high dose flu shot 09/26/2020 - covid booster in 1-2 weeks after flu shot - saline nasal spray as needed for dry nose -Continue pirfenidone [Esbriet] per protocol -Continue Prilosec 40 mg daily -potentially taken on empty stomach -At follow-up we will discuss other pillars of management -  -At some point in the future we will administer interstitial lung disease questionnaire  Follow-up -8 weeks with Dr. Chase Caller 30 min face to face visit        SIGNATURE    Dr. Brand Males, M.D., F.C.C.P,  Pulmonary and Critical Care Medicine Staff Physician, [place] Director - Interstitial Lung Disease  Program  Pulmonary [place] [facility] [place], [place], [address]  Pager: [phone], If no answer or between  15:00h - 7:00h: call [phone] Telephone: [phone]  1:58 PM 09/26/2020

## 2020-09-27 ENCOUNTER — Telehealth: Payer: Self-pay | Admitting: Internal Medicine

## 2020-09-27 DIAGNOSIS — R531 Weakness: Secondary | ICD-10-CM | POA: Diagnosis not present

## 2020-09-27 DIAGNOSIS — S4292XD Fracture of left shoulder girdle, part unspecified, subsequent encounter for fracture with routine healing: Secondary | ICD-10-CM | POA: Diagnosis not present

## 2020-09-27 DIAGNOSIS — M25512 Pain in left shoulder: Secondary | ICD-10-CM | POA: Diagnosis not present

## 2020-09-27 DIAGNOSIS — Z950 Presence of cardiac pacemaker: Secondary | ICD-10-CM | POA: Diagnosis not present

## 2020-09-27 DIAGNOSIS — J84112 Idiopathic pulmonary fibrosis: Secondary | ICD-10-CM

## 2020-09-27 DIAGNOSIS — M6281 Muscle weakness (generalized): Secondary | ICD-10-CM | POA: Diagnosis not present

## 2020-09-27 NOTE — Telephone Encounter (Signed)
Will call back on 10/28 after DME opens as it is currently after 5 pm and they are closed.

## 2020-09-28 NOTE — Telephone Encounter (Signed)
Order has been updated.

## 2020-09-29 ENCOUNTER — Telehealth: Payer: Self-pay | Admitting: Internal Medicine

## 2020-09-29 ENCOUNTER — Telehealth: Payer: Self-pay | Admitting: *Deleted

## 2020-09-29 NOTE — Telephone Encounter (Signed)
Spoke with patient. She had several questions about her AVS. She wanted to know if MR wanted to see her again. I advised her that on AVS, he stated that he wanted to see her again in 8 weeks. I have placed a recall on her chart so when the schedule for December 2021 opens, she will be called. She verbalized understanding.   She had a question about the ONO order on the AVS. She has not completed this. Reviewed her chart, this order was sent to [facility] back in September. She has recently switched over to [place]. While on the phone the RT was at her house getting her setup with a POC. Spoke with Marita Kansas, she stated that if we could send the order to [facility], they would take care of it. Marita Kansas also stated that since the patient was being setup with 24/7 O2, did he still want the ONO to be performed on room air while on cpap? Advised her I would send a message to MR.   MR, please advise on the ONO order. Thanks!

## 2020-09-29 NOTE — Telephone Encounter (Signed)
Spoke with patient. She is aware of MR recs and verbalized understanding.   Nothing further needed at time of call.

## 2020-09-29 NOTE — Telephone Encounter (Signed)
  Patient Consent for Virtual Visit         [name] [name] has provided verbal consent on 09/29/2020 for a virtual visit (video or telephone).   CONSENT FOR VIRTUAL VISIT FOR:  [name] [name]  By participating in this virtual visit I agree to the following:  I hereby voluntarily request, consent and authorize [facility] and its employed or contracted physicians, physician assistants, nurse practitioners or other licensed health care professionals (the Practitioner), to provide me with telemedicine health care services (the "Services") as deemed necessary by the treating Practitioner. I acknowledge and consent to receive the Services by the Practitioner via telemedicine. I understand that the telemedicine visit will involve communicating with the Practitioner through live audiovisual communication technology and the disclosure of certain medical information by electronic transmission. I acknowledge that I have been given the opportunity to request an in-person assessment or other available alternative prior to the telemedicine visit and am voluntarily participating in the telemedicine visit.  I understand that I have the right to withhold or withdraw my consent to the use of telemedicine in the course of my care at any time, without affecting my right to future care or treatment, and that the Practitioner or I may terminate the telemedicine visit at any time. I understand that I have the right to inspect all information obtained and/or recorded in the course of the telemedicine visit and may receive copies of available information for a reasonable fee.  I understand that some of the potential risks of receiving the Services via telemedicine include:  Marland Kitchen Delay or interruption in medical evaluation due to technological equipment failure or disruption; . Information transmitted may not be sufficient (e.g. poor resolution of images) to allow for appropriate medical decision making by the Practitioner;  and/or  . In rare instances, security protocols could fail, causing a breach of personal health information.  Furthermore, I acknowledge that it is my responsibility to provide information about my medical history, conditions and care that is complete and accurate to the best of my ability. I acknowledge that Practitioner's advice, recommendations, and/or decision may be based on factors not within their control, such as incomplete or inaccurate data provided by me or distortions of diagnostic images or specimens that may result from electronic transmissions. I understand that the practice of medicine is not an exact science and that Practitioner makes no warranties or guarantees regarding treatment outcomes. I acknowledge that a copy of this consent can be made available to me via my patient portal ([name]), or I can request a printed copy by calling the office of [facility].    I understand that my insurance will be billed for this visit.   I have read or had this consent read to me. . I understand the contents of this consent, which adequately explains the benefits and risks of the Services being provided via telemedicine.  . I have been provided ample opportunity to ask questions regarding this consent and the Services and have had my questions answered to my satisfaction. . I give my informed consent for the services to be provided through the use of telemedicine in my medical care

## 2020-09-29 NOTE — Telephone Encounter (Signed)
If they are setting her up for 24/7 o2 then no need to test for ono.

## 2020-10-03 ENCOUNTER — Telehealth: Payer: Self-pay | Admitting: Cardiovascular Disease

## 2020-10-03 NOTE — Telephone Encounter (Signed)
Pirfenidone    No drug-drug interaction identified with current prescriptions.    May cause dizziness and headaches, but not liked to bradycardia

## 2020-10-03 NOTE — Telephone Encounter (Signed)
I called [employer] with the pt on the phone. They are sending her a new handheld for her monitor. I told her when she receive the handheld to go ahead and send the transmission. I told her if the nurse need anything else she would give her a call back.

## 2020-10-03 NOTE — Telephone Encounter (Signed)
STAT if HR is under 50 or over 120 (normal HR is 60-100 beats per minute)  1) What is your heart rate? 65  2) Do you have a log of your heart rate readings (document readings)?  10/02/20: 35, 72, 48, 35, 52 10/03/20: 42, 52  3) Do you have any other symptoms? Headaches

## 2020-10-03 NOTE — Telephone Encounter (Signed)
Covering in triage today.  Patient reported 2-3 days hx of bradycardia in 30-50s. No syncope or dizziness.  She has chronic dyspnea due to pulmonary fibrosis. She was started on Esbriet 2 week ago.   Will interogate device.  Pharmacy to review side effect of new drug. Dr. Loletha Grayer to advise further.

## 2020-10-03 NOTE — Telephone Encounter (Signed)
The pt agreed to send a manual transmission with her home remote monitor. I told her once the nurse see the transmission, she will review it and give her a call back.

## 2020-10-03 NOTE — Telephone Encounter (Signed)
We attempted to help patient with remote transmission of device.  Her remote monitor is not working properly and needs to be replaced.  A new one is being sent and she should have it in 7-10 days.  Last transmission received on 8/21 showed normal device function with more than 5 years of battery life remaining.  Her Lower rate is set at 60 bpm.

## 2020-10-04 NOTE — Telephone Encounter (Signed)
Spoke with the patient. She will sen [place] transmission once receives device. Keep appointment later this week to monitor symptoms. Patient agrees with plan.

## 2020-10-05 ENCOUNTER — Telehealth: Payer: Medicare Other | Admitting: Cardiology

## 2020-10-06 ENCOUNTER — Telehealth (INDEPENDENT_AMBULATORY_CARE_PROVIDER_SITE_OTHER): Payer: Medicare Other | Admitting: Cardiology

## 2020-10-06 ENCOUNTER — Encounter: Payer: Self-pay | Admitting: Cardiology

## 2020-10-06 DIAGNOSIS — Z6841 Body Mass Index (BMI) 40.0 and over, adult: Secondary | ICD-10-CM | POA: Diagnosis not present

## 2020-10-06 DIAGNOSIS — Z950 Presence of cardiac pacemaker: Secondary | ICD-10-CM | POA: Diagnosis not present

## 2020-10-06 DIAGNOSIS — Z9989 Dependence on other enabling machines and devices: Secondary | ICD-10-CM | POA: Diagnosis not present

## 2020-10-06 DIAGNOSIS — J8410 Pulmonary fibrosis, unspecified: Secondary | ICD-10-CM

## 2020-10-06 DIAGNOSIS — Z7901 Long term (current) use of anticoagulants: Secondary | ICD-10-CM | POA: Diagnosis not present

## 2020-10-06 DIAGNOSIS — I480 Paroxysmal atrial fibrillation: Secondary | ICD-10-CM

## 2020-10-06 DIAGNOSIS — G4733 Obstructive sleep apnea (adult) (pediatric): Secondary | ICD-10-CM

## 2020-10-06 DIAGNOSIS — I700 Atherosclerosis of aorta: Secondary | ICD-10-CM | POA: Diagnosis not present

## 2020-10-06 DIAGNOSIS — E66813 Obesity, class 3: Secondary | ICD-10-CM

## 2020-10-06 MED ORDER — APIXABAN 5 MG PO TABS
5.0000 mg | ORAL_TABLET | Freq: Two times a day (BID) | ORAL | 3 refills | Status: DC
Start: 2020-10-06 — End: 2022-02-11

## 2020-10-06 NOTE — Patient Instructions (Signed)
Medication Instructions:  Your physician recommends that you continue on your current medications as directed. Please refer to the Current Medication list given to you today.  *If you need a refill on your cardiac medications before your next appointment, please call your pharmacy*   Lab Work: -None  If you have labs (blood work) drawn today and your tests are completely normal, you will receive your results only by:  [place] (if you have MyChart) OR  A paper copy in the mail If you have any lab test that is abnormal or we need to change your treatment, we will call you to review the results.   Testing/Procedures: -None   Follow-Up: At [facility], you and your health needs are our priority.  As part of our continuing mission to provide you with exceptional heart care, we have created designated Provider Care Teams.  These Care Teams include your primary Cardiologist (physician) and Advanced Practice Providers (APPs -  Physician Assistants and Nurse Practitioners) who all work together to provide you with the care you need, when you need it.  We recommend signing up for the patient portal called "MyChart".  Sign up information is provided on this After Visit Summary.  MyChart is used to connect with patients for Virtual Visits (Telemedicine).  Patients are able to view lab/test results, encounter notes, upcoming appointments, etc.  Non-urgent messages can be sent to your provider as well.   To learn more about what you can do with MyChart, go to [email or web address].    Your next appointment:   Your physician recommends that you keep your scheduled follow-up appointment with Dr. Sallyanne Kuster on February 7 @ 10:00 am.       Other Instructions -None

## 2020-10-06 NOTE — Progress Notes (Signed)
Virtual Visit via Telephone Note   This visit type was conducted due to national recommendations for restrictions regarding the COVID-19 Pandemic (e.g. social distancing) in an effort to limit this patient's exposure and mitigate transmission in our community.  Due to her co-morbid illnesses, this patient is at least at moderate risk for complications without adequate follow up.  This format is felt to be most appropriate for this patient at this time.  The patient did not have access to video technology/had technical difficulties with video requiring transitioning to audio format only (telephone).  All issues noted in this document were discussed and addressed.  No physical exam could be performed with this format.  Please refer to the patient's chart for her  consent to telehealth for [name] [name].    Date:  10/06/2020   ID:  [name] [name], DOB August 04, 1941, MRN [id number] The patient was identified using 2 identifiers.  Patient Location: Home Provider Location: Home Office  PCP:  [name], MD  Cardiologist:  Sanda Klein, MD  Electrophysiologist:  None   Evaluation Performed:  Follow-Up Visit  Chief Complaint:  none  History of Present Illness:    [name] [name] is a pleasant 79 y.o. female with with a history of PAF and sick sinus syndrome. She had a [employer] pacemaker implanted in twenty fifteen. She is on Eliquis with a CHA DS VASC score of five. Other medical issues include pulmonary fibrosis followed by the pulmonary service, sleep apnea on CPAP, chronic renal insufficiency stage II, and obesity with a BMI of forty-one.  The patient saw Dr. Sallyanne Kuster in December 2020. He mentions that her beta-blocker had been stopped in the past because of symptoms of fatigue. Pacemaker interrogation then showed a low burden of PAF. The patient was contacted today for follow-up. Since we saw her last she is done well, she is in a study for pulmonary fibrosis through the pulmonary clinic. She  asked why her beta-blocker was stopped and I reviewed the notes with her. She says that occasionally her her heart will "cut up". She says at times she thinks her heart rates in the 30s and then in the 80s. It does not sound like she is having any fast heart rates. She actually takes a Valium during these episodes and that seems to help more than anything else. I discussed the possibility of resuming her beta-blocker since her fatigue did not resolve off the beta-blocker. She indicated to me that she is not noted her heart rate to be greater than eighty and i'm not sure beta-blocker would make much difference at this point. She would like to hold off on this for now. She has had some problems with her home pacemaker interrogator. A new one has been ordered. I asked her to contact the office when that comes so Dr. Sallyanne Kuster can review her transmission.  The patient does not have symptoms concerning for COVID-19 infection (fever, chills, cough, or new shortness of breath).    Past Medical History:  Diagnosis Date  . Alpha-1-antitrypsin deficiency carrier   . Arthritis    "all over"  . Atherosclerosis of aorta ([place])   . Atrial fibrillation ([place])   . Back pain   . Complication of anesthesia    "I woke up during 2 different procedures" (10/04/2014)  . Diastolic dysfunction   . Frequent UTI   . GERD (gastroesophageal reflux disease)   . Hypertension   . Hypertriglyceridemia   . Insulin resistance   . Long term  current use of anticoagulant   . Multiple lung nodules   . Muscular deconditioning   . MVP (mitral valve prolapse)   . Obesity   . OSA on CPAP   . Osteoarthritis   . Osteopenia    unspecified location  . Pacemaker   . PAF (paroxysmal atrial fibrillation) ([place])   . Positive ANA (antinuclear antibody)   . Presbyesophagus   . Presence of permanent cardiac pacemaker   . Primary osteoarthritis of both hands    neck and spine and hips  . Pulmonary fibrosis ([place])    followed by  pulmonolgy  . PVC's (premature ventricular contractions)   . Shortness of breath   . 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 Sleep apnea   . Stroke [facility]) 01/2014   denies deficits on 10/04/2014  . Tachy-brady syndrome ([place]) 10/2014  . TIA (transient ischemic attack)   . Vitamin D deficiency    Past Surgical History:  Procedure Laterality Date  . CARDIOVERSION N/A 09/02/2017   Procedure: CARDIOVERSION;  Surgeon: Sanda Klein, MD;  Location: [facility];  Service: Cardiovascular;  Laterality: N/A;  . COLONOSCOPY  2019  . ESOPHAGEAL DILATION    . EXCISION VAGINAL CYST     benign nodules  . INSERT / REPLACE / REMOVE PACEMAKER  10/04/2014   Medtronic Advisa model [id number] serial number [id number]  . LOOP RECORDER EXPLANT N/A 10/04/2014   Procedure: LOOP RECORDER EXPLANT;  Surgeon: Sanda Klein, MD;  Location: [facility] LAB;  Service: Cardiovascular;  Laterality: N/A;  . LOOP RECORDER IMPLANT N/A 04/19/2014   Procedure: LOOP RECORDER IMPLANT;  Surgeon: Sanda Klein, MD;  Location: [facility] C[facility] LAB;  Service: Cardiovascular;  Laterality: N/A;  . NM MYOCAR PERF WALL MOTION  12/18/2007   normal  . PERMANENT PACEMAKER INSERTION N/A 10/04/2014   Procedure: PERMANENT PACEMAKER INSERTION;  Surgeon: Sanda Klein, MD;  Location: [facility] LAB;  Service: Cardiovascular;  Laterality: N/A;  . TUBAL LIGATION    . US ECHOCARDIOGRAPHY  05/15/2010   LA mildly dilated,mild mitral annular ca+, AOV mildly sclerotic, mild asymmetric LVH     Current Meds  Medication Sig  . apixaban (ELIQUIS) 5 MG TABS tablet Take 1 tablet (5 mg total) by mouth 2 (two) times daily.  . B Complex-C (SUPER B COMPLEX PO) Take 1 tablet by mouth daily.  . Biotin 5000 MCG CAPS Take 5,000 mcg by mouth daily.   . Calcium Carbonate-Vit D-Min (CALCIUM 1200 PO) Take 1,200 mg by mouth daily.   . cholecalciferol (VITAMIN D) 1000 UNITS tablet Take 6,000 Units by mouth daily.   Marland Kitchen Co-Enzyme Q10 100 MG  CAPS Take 100 mg by mouth daily.   . diazepam (VALIUM) 5 MG tablet TAKE 1 TABLET EACH DAY. (Patient taking differently: Take 5 mg by mouth every 6 (six) hours as needed for anxiety. TAKE 1 TABLET EACH DAY.)  . fluticasone (FLONASE) 50 MCG/ACT nasal spray Place 2 sprays into both nostrils as needed for allergies or rhinitis.  . folic acid (FOLVITE) 782 MCG tablet Take 400 mcg by mouth daily.  Marland Kitchen glucosamine-chondroitin 500-400 MG tablet Take 1 tablet by mouth 2 (two) times daily.   Marland Kitchen loratadine (KLS ALLERCLEAR) 10 MG tablet Take 10 mg by mouth daily.  Marland Kitchen MAGNESIUM CITRATE PO Take 400 mg by mouth daily. 1 tab daily  . nitroGLYCERIN (NITROSTAT) 0.4 MG SL tablet Place 1 tablet (0.4 mg total) under the tongue every 5 (five) minutes as needed  for chest pain.  Marland Kitchen omeprazole (PRILOSEC) 20 MG capsule Take 40 mg by mouth daily.   . Pirfenidone (ESBRIET PO) Take by mouth.  . Resveratrol 250 MG CAPS Take 250 mg by mouth daily.  . sertraline (ZOLOFT) 100 MG tablet TAKE 1 TABLET EACH DAY.  Marland Kitchen TURMERIC PO Take 1,000 mg by mouth daily.  . [DISCONTINUED] ELIQUIS 5 MG TABS tablet TAKE 1 TABLET BY MOUTH TWICE DAILY.     Allergies:   Ancef [cefazolin], Pseudoephedrine hcl, Ergotamine, Pentobarbital, and Caffeine   Social History   Tobacco Use  . Smoking status: Former Smoker    Packs/day: 2.00    Years: 18.00    Pack years: 36.00    Types: Cigarettes    Quit date: 07/18/1970    Years since quitting: 50.2  . Smokeless tobacco: Never Used  . Tobacco comment: "quit smoking in 1971"  Vaping Use  . Vaping Use: Never used  Substance Use Topics  . Alcohol use: Yes    Alcohol/week: 0.0 standard drinks    Comment: 10/04/2014 "might have a drink a couple times/yr"  . Drug use: No     Family Hx: The patient's family history includes Alcoholism in her brother; Alpha-1 antitrypsin deficiency in her daughter; Arthritis in her son; Breast cancer in her daughter; Diabetes in her son; Healthy in her brother, daughter,  and sister; Heart attack in her mother; Heart failure in her father; Prostate cancer in her brother, brother, and brother; Stroke in her brother, brother, and father; Sudden death in her mother.  ROS:   Please see the history of present illness.    All other systems reviewed and are negative.   Prior CV studies:   The following studies were reviewed today:  Echo June 2020- IMPRESSIONS    1. The left ventricle has low normal systolic function, with an ejection  fraction of 50-55%. The cavity size was normal. Left ventricular diastolic  function could not be evaluated.  2. The right ventricle has normal systolic function. The cavity was  normal.  3. The mitral valve is grossly normal.  4. The tricuspid valve is grossly normal.  5. The aortic valve is tricuspid. Mild thickening of the aortic valve. No  stenosis of the aortic valve.  6. Normal LV systolic function; no significant valvular heart disease.   Labs/Other Tests and Data Reviewed:    EKG:  An ECG dated 07/14/2019 was personally reviewed today and demonstrated:  NSR-78, atrial paced on demand  Recent Labs: 07/28/2020: BUN 25; Creat 1.09; Hemoglobin 14.5; Platelets 260; Potassium 4.3; Sodium 141 09/26/2020: ALT 13   Recent Lipid Panel Lab Results  Component Value Date/Time   CHOL 195 07/28/2020 09:13 AM   CHOL 176 06/03/2018 10:22 AM   TRIG 133 07/28/2020 09:13 AM   HDL 55 07/28/2020 09:13 AM   HDL 53 06/03/2018 10:22 AM   CHOLHDL 3.5 07/28/2020 09:13 AM   LDLCALC 115 (H) 07/28/2020 09:13 AM   LDLDIRECT 81.0 07/15/2016 02:19 PM    Wt Readings from Last 3 Encounters:  09/26/20 266 lb 9.6 oz (120.9 kg)  08/10/20 262 lb 12.8 oz (119.2 kg)  07/26/20 261 lb (118.4 kg)     Risk Assessment/Calculations:     CHA2DS2-VASc Score =   7 This indicates a  % annual risk of stroke. The patient's score is based upon:age, sex, TIA, H/O aortic atherosclerosis        Objective:    Vital Signs:  LMP  (LMP Unknown)  VITAL SIGNS:  reviewed  ASSESSMENT & PLAN:    PAF/SSS- It sounds like she may occasionally go into atrial fibrillation but her rate is not fast. She is reluctant to resume her beta-blocker and at this point I am not sure would make any difference since her heart rate has been eighty or less.  MDT PTVDP- She tells me at times her pulse ox shows that her heart rate is thirty-five, I explained that the pacemaker should prevent her heart rate from going that low. I will ask Dr. Sallyanne Kuster to review her transmission once she receives her home interrogation device.  Pulmonary fibrosis- Followed by Dr Chase Caller  Obesity- BMI 41- no change in weight  CRI-2 GFR 48  Sleep apnea- On C-pap  Aortic atherosclerosis - Noted on prior imaging study  H/O TIA- Per dr C's office note  Plan: No change in Rx- dr C to review transmission when available.  She has a f/u in Feb 2022 and will keep that.     COVID-19 Education: The signs and symptoms of COVID-19 were discussed with the patient and how to seek care for testing (follow up with PCP or arrange E-visit).  The importance of social distancing was discussed today.  Time:   Today, I have spent 15 minutes with the patient with telehealth technology discussing the above problems.     Medication Adjustments/Labs and Tests Ordered: Current medicines are reviewed at length with the patient today.  Concerns regarding medicines are outlined above.   Tests Ordered: No orders of the defined types were placed in this encounter.   Medication Changes: Meds ordered this encounter  Medications  . apixaban (ELIQUIS) 5 MG TABS tablet    Sig: Take 1 tablet (5 mg total) by mouth 2 (two) times daily.    Dispense:  180 tablet    Refill:  3    Follow Up:  In Person as scheduled  Signed, Kerin Ransom, Hershal Coria  10/06/2020 11:13 AM    [place]

## 2020-10-11 ENCOUNTER — Ambulatory Visit (INDEPENDENT_AMBULATORY_CARE_PROVIDER_SITE_OTHER): Payer: Medicare Other

## 2020-10-11 DIAGNOSIS — M67912 Unspecified disorder of synovium and tendon, left shoulder: Secondary | ICD-10-CM | POA: Diagnosis not present

## 2020-10-11 DIAGNOSIS — I495 Sick sinus syndrome: Secondary | ICD-10-CM | POA: Diagnosis not present

## 2020-10-12 ENCOUNTER — Telehealth: Payer: Self-pay

## 2020-10-12 NOTE — Telephone Encounter (Signed)
Patient called in regards to wanting results from last transmission. Transmission shows AF burden 0.6%, longest AF episode 9 hours, + OAC- Eliquis.   Patient reports of feeling fatigued for some time. States today she has felt better and did not this she was out of rhythm. Advised patient she could send another transmission and I could see if she is back in rhythm. States she will do that this evening.   Patient reports of starting a trial medication Esbriet for pulmonary fibrosis 3 weeks ago. States she has felt bad since starting medication. Patient advised to call office that prescribed it to see if this could be why patient is feeling bad. Explained to patient she has previous episodes of AF throughout her remotes. Patient would like Dr. Sallyanne Kuster to be made aware to seeif he has any recommendations. Advised patient I will send after another transmission is received for presenting.   Agreeable to plan.

## 2020-10-13 LAB — CUP PACEART REMOTE DEVICE CHECK
Battery Remaining Longevity: 54 mo
Battery Voltage: 2.99 V
Brady Statistic AP VP Percent: 0.03 %
Brady Statistic AP VS Percent: 68.97 %
Brady Statistic AS VP Percent: 0.02 %
Brady Statistic AS VS Percent: 30.98 %
Brady Statistic RA Percent Paced: 63.82 %
Brady Statistic RV Percent Paced: 0.05 %
Date Time Interrogation Session: 20211111132058
Implantable Lead Implant Date: 20151103
Implantable Lead Implant Date: 20151103
Implantable Lead Location: 753859
Implantable Lead Location: 753860
Implantable Lead Model: 5076
Implantable Lead Model: 5076
Implantable Pulse Generator Implant Date: 20151103
Lead Channel Impedance Value: 399 Ohm
Lead Channel Impedance Value: 437 Ohm
Lead Channel Impedance Value: 456 Ohm
Lead Channel Impedance Value: 475 Ohm
Lead Channel Pacing Threshold Amplitude: 0.5 V
Lead Channel Pacing Threshold Amplitude: 0.625 V
Lead Channel Pacing Threshold Pulse Width: 0.4 ms
Lead Channel Pacing Threshold Pulse Width: 0.4 ms
Lead Channel Sensing Intrinsic Amplitude: 4.875 mV
Lead Channel Sensing Intrinsic Amplitude: 4.875 mV
Lead Channel Sensing Intrinsic Amplitude: 5.875 mV
Lead Channel Sensing Intrinsic Amplitude: 5.875 mV
Lead Channel Setting Pacing Amplitude: 1.5 V
Lead Channel Setting Pacing Amplitude: 2 V
Lead Channel Setting Pacing Pulse Width: 0.4 ms
Lead Channel Setting Sensing Sensitivity: 2.8 mV

## 2020-10-13 NOTE — Telephone Encounter (Signed)
Updated transmission received.  No new Arrhythmia episodes since previous transmission on 11/8.  Per patient request forwarding to Dr. Sallyanne Kuster.    Presenting

## 2020-10-13 NOTE — Progress Notes (Signed)
Remote pacemaker transmission.   

## 2020-10-14 NOTE — Telephone Encounter (Signed)
Thank you!

## 2020-10-15 NOTE — Telephone Encounter (Signed)
Was her 10/09/2020 download done in person somewhere? All arrhythmia episodes deleted and cannot be reviewed in [facility]. Do not see a hard copy scanned in anywhere.

## 2020-10-16 DIAGNOSIS — Z23 Encounter for immunization: Secondary | ICD-10-CM | POA: Diagnosis not present

## 2020-10-16 LAB — CUP PACEART REMOTE DEVICE CHECK
Battery Remaining Longevity: 54 mo
Battery Voltage: 2.99 V
Brady Statistic AP VP Percent: 0.04 %
Brady Statistic AP VS Percent: 66.06 %
Brady Statistic AS VP Percent: 0.04 %
Brady Statistic AS VS Percent: 33.86 %
Brady Statistic RA Percent Paced: 64.38 %
Brady Statistic RV Percent Paced: 0.08 %
Date Time Interrogation Session: 20211108114416
Implantable Lead Implant Date: 20151103
Implantable Lead Implant Date: 20151103
Implantable Lead Location: 753859
Implantable Lead Location: 753860
Implantable Lead Model: 5076
Implantable Lead Model: 5076
Implantable Pulse Generator Implant Date: 20151103
Lead Channel Impedance Value: 399 Ohm
Lead Channel Impedance Value: 437 Ohm
Lead Channel Impedance Value: 456 Ohm
Lead Channel Impedance Value: 475 Ohm
Lead Channel Pacing Threshold Amplitude: 0.5 V
Lead Channel Pacing Threshold Amplitude: 0.5 V
Lead Channel Pacing Threshold Pulse Width: 0.4 ms
Lead Channel Pacing Threshold Pulse Width: 0.4 ms
Lead Channel Sensing Intrinsic Amplitude: 2.75 mV
Lead Channel Sensing Intrinsic Amplitude: 2.75 mV
Lead Channel Sensing Intrinsic Amplitude: 6.5 mV
Lead Channel Sensing Intrinsic Amplitude: 6.5 mV
Lead Channel Setting Pacing Amplitude: 1.5 V
Lead Channel Setting Pacing Amplitude: 2 V
Lead Channel Setting Pacing Pulse Width: 0.4 ms
Lead Channel Setting Sensing Sensitivity: 2.8 mV

## 2020-10-16 NOTE — Telephone Encounter (Signed)
Thanks, Amy

## 2020-10-16 NOTE — Telephone Encounter (Signed)
It was in [facility].  I exported the report to you so you can see it in Epic.  AET, RN

## 2020-10-16 NOTE — Telephone Encounter (Signed)
It took a while to get the whole picture, but the only day with meaningful rhythm problems was October 29 (a Friday). About 4 hours during early morning sleep hours, then about 9 hours starting at 9AM. Symptoms on any other days between her first call and November 11 (when we got the last download) cannot be attributed to rhythm changes.

## 2020-10-17 ENCOUNTER — Telehealth: Payer: Self-pay | Admitting: Internal Medicine

## 2020-10-17 NOTE — Telephone Encounter (Signed)
Called and spoke with pt and she stated that she spoke with people at [facility] and she stated that she is very fatigued while taking the esbriet.  She is wanting to see if we can cut back on the medication.  She also said that she is almost out and wanted to know if we have any samples to last her until she gets her meds.  MR please advise. Thanks

## 2020-10-17 NOTE — Telephone Encounter (Signed)
Called and spoke with pt who states she  Believes she has been on 3 tabs three times daily of the Esbriet for about two weeks now. Pt is doing the 231m.   On a scale of 1-10 for fatigue, pt said she rates it as a 6-7. Pt feels like she is dragging, stating that she was thinking it was also due to her afib and called cardiologist but they told her that her pacemaker was doing okay.  Pt believes fatigue got worse when she went to taking the 3 tabs three times daily.

## 2020-10-17 NOTE — Telephone Encounter (Signed)
What dose of pirfenidone is the patient taking?  Pirfenidone can indeed cause fatigue.  On a scale of 1-10 10 being the worst could you please find out how bad the fatigue is  At this point in time we do not have any donor samples of pirfenidone.  Normally this the medication without any samples.  Sometimes we get donor samples from patient's but we do not have any

## 2020-10-18 ENCOUNTER — Telehealth: Payer: Self-pay | Admitting: Internal Medicine

## 2020-10-18 MED ORDER — ESBRIET 267 MG PO TABS: ORAL_TABLET | ORAL | 2 refills | Status: DC

## 2020-10-18 NOTE — Telephone Encounter (Signed)
[facility], was on hold for over 5 mins. Will try back.

## 2020-10-18 NOTE — Telephone Encounter (Signed)
Spoke with pt. She is aware of MR's response. Pt has been scheduled with MR on 11/09/20 at 1130. Pt needed a refill on Esbriet, this has been sent in. Nothing further was needed.

## 2020-10-18 NOTE — Telephone Encounter (Signed)
Do LFT check 10/18/2020  Stop esbriet for 1-2 weeks - restart after thanksgiving Then start estbrete 267 mg 1 pill three times daily x 3 weeks Then go uptop esbriet 278m 2 pill three times daily  X stay at this dose Do not go to 3 pills three times daily till further notice If fatigue recurs with above plan then we can look at alternative  Followup 30 min within 4 - 6 weeks from 10/18/2020  She should call at any time if any concerns - thanks for calling about fatigue

## 2020-10-20 NOTE — Telephone Encounter (Signed)
[name] [name] w/[facility] returning call from office. Regarding verification of a prescription. Please advise    QJ#[id number]

## 2020-10-20 NOTE — Telephone Encounter (Signed)
Called and LMTCB for [facility]

## 2020-10-23 NOTE — Telephone Encounter (Signed)
Called [facility] and spoke to pharmacist, Jenny Reichmann. He wanted to be sure the titration was correct as the quantity didn't mask the correct amount for the scripted directions. However, the quantity maintained at 180 tabs for 30 days and the refills were changed to a years worth. [name] [name] verbalized understanding and denied any further questions or concerns at this time.

## 2020-10-25 ENCOUNTER — Other Ambulatory Visit: Payer: Self-pay

## 2020-10-25 ENCOUNTER — Non-Acute Institutional Stay: Payer: Medicare Other | Admitting: Internal Medicine

## 2020-10-25 ENCOUNTER — Encounter: Payer: Self-pay | Admitting: Internal Medicine

## 2020-10-25 VITALS — BP 140/90 | HR 71 | Temp 96.8°F | Ht 67.0 in | Wt 271.8 lb

## 2020-10-25 DIAGNOSIS — R7303 Prediabetes: Secondary | ICD-10-CM | POA: Diagnosis not present

## 2020-10-25 DIAGNOSIS — Z9989 Dependence on other enabling machines and devices: Secondary | ICD-10-CM

## 2020-10-25 DIAGNOSIS — F329 Major depressive disorder, single episode, unspecified: Secondary | ICD-10-CM

## 2020-10-25 DIAGNOSIS — E785 Hyperlipidemia, unspecified: Secondary | ICD-10-CM | POA: Diagnosis not present

## 2020-10-25 DIAGNOSIS — I480 Paroxysmal atrial fibrillation: Secondary | ICD-10-CM

## 2020-10-25 DIAGNOSIS — J8410 Pulmonary fibrosis, unspecified: Secondary | ICD-10-CM | POA: Diagnosis not present

## 2020-10-25 DIAGNOSIS — I5189 Other ill-defined heart diseases: Secondary | ICD-10-CM | POA: Diagnosis not present

## 2020-10-25 DIAGNOSIS — M1990 Unspecified osteoarthritis, unspecified site: Secondary | ICD-10-CM

## 2020-10-25 DIAGNOSIS — G4733 Obstructive sleep apnea (adult) (pediatric): Secondary | ICD-10-CM

## 2020-10-25 NOTE — Progress Notes (Signed)
Location:      Place of Service:     Provider:   Code Status:  Goals of Care:  Advanced Directives 09/23/2019  Does Patient Have a Medical Advance Directive? Yes  Type of Advance Directive Living will;Healthcare Power of Attorney  Does patient want to make changes to medical advance directive? No - Patient declined  Copy of [place] in Chart? Yes - validated most recent copy scanned in chart (See row information)     Chief Complaint  Patient presents with  . Medical Management of Chronic Issues    3 month follow up. Patient has questions about lab work. Patient uses oxygen.  Marland Kitchen Best Practice Recommendations    Tetanus/Tdap vaccine.    HPI: Patient is a 79 y.o. female seen today for medical management of chronic diseases.    [place] . Was having difficulty managing house by Herself. Is independent. Uses [employer] to walk and [employer] for [employer].Still drives  Active issues  ILD Was started on Esbriet patient started having extreme fatigue on high doses.  Dr. Chase Caller has stopped it at this time.  She is supposed to restart it after Thanksgiving.  Patient states she is feeling better now.  Is at baseline.  Her shortness of breath has improved also is on as needed oxygen.  Was able to walk today without getting short of breath and weak. History of PAF with tachybradycardia s/p pacemaker Depression Active issue takes Valium as needed also on Zoloft.  Talks to a psychologist as needed.  She lost her daughter few years ago to trypsin deficiency. Has 1 daughter who lives in [place]. Pain in knee and shoulders Follows with orthopedics OSA on CPAP History of positive ANA No Work up per Dr Estanislado Pandy    Past Medical History:  Diagnosis Date  . Alpha-1-antitrypsin deficiency carrier   . Arthritis    "all over"  . Atherosclerosis of aorta ([place])   . Atrial fibrillation ([place])   . Back pain   . Complication of anesthesia      "I woke up during 2 different procedures" (10/04/2014)  . Diastolic dysfunction   . Frequent UTI   . GERD (gastroesophageal reflux disease)   . Hypertension   . Hypertriglyceridemia   . Insulin resistance   . Long term current use of anticoagulant   . Multiple lung nodules   . Muscular deconditioning   . MVP (mitral valve prolapse)   . Obesity   . OSA on CPAP   . Osteoarthritis   . Osteopenia    unspecified location  . Pacemaker   . PAF (paroxysmal atrial fibrillation) ([place])   . Positive ANA (antinuclear antibody)   . Presbyesophagus   . Presence of permanent cardiac pacemaker   . Primary osteoarthritis of both hands    neck and spine and hips  . Pulmonary fibrosis ([place])    followed by pulmonolgy  . PVC's (premature ventricular contractions)   . Shortness of breath   . 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 Sleep apnea   . Stroke [facility]) 01/2014   denies deficits on 10/04/2014  . Tachy-brady syndrome ([place]) 10/2014  . TIA (transient ischemic attack)   . Vitamin D deficiency     Past Surgical History:  Procedure Laterality Date  . CARDIOVERSION N/A 09/02/2017   Procedure: CARDIOVERSION;  Surgeon: Sanda Klein, MD;  Location: [place];  Service: Cardiovascular;  Laterality: N/A;  . COLONOSCOPY  2019  . ESOPHAGEAL DILATION    . EXCISION VAGINAL CYST     benign nodules  . INSERT / REPLACE / REMOVE PACEMAKER  10/04/2014   Medtronic Advisa model [id number] serial number [id number]  . LOOP RECORDER EXPLANT N/A 10/04/2014   Procedure: LOOP RECORDER EXPLANT;  Surgeon: Sanda Klein, MD;  Location: [place] CATH LAB;  Service: Cardiovascular;  Laterality: N/A;  . LOOP RECORDER IMPLANT N/A 04/19/2014   Procedure: LOOP RECORDER IMPLANT;  Surgeon: Sanda Klein, MD;  Location: [place] CATH LAB;  Service: Cardiovascular;  Laterality: N/A;  . NM MYOCAR PERF WALL MOTION  12/18/2007   normal  . PERMANENT PACEMAKER INSERTION N/A 10/04/2014    Procedure: PERMANENT PACEMAKER INSERTION;  Surgeon: Sanda Klein, MD;  Location: [place] [facility] CATH LAB;  Service: Cardiovascular;  Laterality: N/A;  . TUBAL LIGATION    . US ECHOCARDIOGRAPHY  05/15/2010   LA mildly dilated,mild mitral annular ca+, AOV mildly sclerotic, mild asymmetric LVH    Allergies  Allergen Reactions  . Ancef [Cefazolin] Hives and Itching  . Pseudoephedrine Hcl     Other reaction(s): palpitations (moderate)  . Ergotamine   . Pentobarbital   . Caffeine Palpitations    Outpatient Encounter Medications as of 10/25/2020  Medication Sig  . apixaban (ELIQUIS) 5 MG TABS tablet Take 1 tablet (5 mg total) by mouth 2 (two) times daily.  . B Complex-C (SUPER B COMPLEX PO) Take 1 tablet by mouth daily.  . Biotin 5000 MCG CAPS Take 5,000 mcg by mouth daily.   . Calcium Carbonate-Vit D-Min (CALCIUM 1200 PO) Take 1,200 mg by mouth daily.   . cholecalciferol (VITAMIN D) 1000 UNITS tablet Take 6,000 Units by mouth daily.   Marland Kitchen Co-Enzyme Q10 100 MG CAPS Take 100 mg by mouth daily.   . diazepam (VALIUM) 5 MG tablet TAKE 1 TABLET EACH DAY. (Patient taking differently: Take 5 mg by mouth every 6 (six) hours as needed for anxiety. TAKE 1 TABLET EACH DAY.)  . fluticasone (FLONASE) 50 MCG/ACT nasal spray Place 2 sprays into both nostrils as needed for allergies or rhinitis.  . folic acid (FOLVITE) 453 MCG tablet Take 400 mcg by mouth daily.  Marland Kitchen glucosamine-chondroitin 500-400 MG tablet Take 1 tablet by mouth 2 (two) times daily.   Marland Kitchen loratadine (KLS ALLERCLEAR) 10 MG tablet Take 10 mg by mouth daily.  Marland Kitchen MAGNESIUM CITRATE PO Take 400 mg by mouth daily. 1 tab daily  . nitroGLYCERIN (NITROSTAT) 0.4 MG SL tablet Place 1 tablet (0.4 mg total) under the tongue every 5 (five) minutes as needed for chest pain.  Marland Kitchen omeprazole (PRILOSEC) 20 MG capsule Take 40 mg by mouth daily.   . Pirfenidone (ESBRIET) 267 MG TABS Take 1 tab TID for 3 weeks, then increase to 2 tabs TID and stay at this dose.  Marland Kitchen Resveratrol  250 MG CAPS Take 250 mg by mouth daily.  . sertraline (ZOLOFT) 100 MG tablet TAKE 1 TABLET EACH DAY.  Marland Kitchen TURMERIC PO Take 1,000 mg by mouth daily.   No facility-administered encounter medications on file as of 10/25/2020.    Review of Systems:  Review of Systems  Constitutional: Negative.   HENT: Negative.   Respiratory: Positive for shortness of breath.   Cardiovascular: Negative.   Gastrointestinal: Negative.   Genitourinary: Negative.   Musculoskeletal: Positive for gait problem.  Skin: Negative.   Neurological: Positive for weakness.  Psychiatric/Behavioral: Negative.     Health Maintenance  Topic Date Due  .  TETANUS/TDAP  Never done  . MAMMOGRAM  02/13/2021  . INFLUENZA VACCINE  Completed  . DEXA SCAN  Completed  . COVID-19 Vaccine  Completed  . PNA vac Low Risk Adult  Completed    Physical Exam: Vitals:   10/25/20 1506  BP: 140/90  Pulse: 71  Temp: (!) 96.8 F (36 C)  SpO2: 95%  Weight: 271 lb 12.8 oz (123.3 kg)  Height: _0  (1.702 m)   Body mass index is 42.57 kg/m. Physical Exam Vitals reviewed.  Constitutional:      Appearance: She is obese.  HENT:     Head: Normocephalic.     Nose: Nose normal.     Mouth/Throat:     Mouth: Mucous membranes are moist.     Pharynx: Oropharynx is clear.  Eyes:     Pupils: Pupils are equal, round, and reactive to light.  Cardiovascular:     Rate and Rhythm: Normal rate. Rhythm irregular.     Pulses: Normal pulses.  Pulmonary:     Effort: Pulmonary effort is normal.     Breath sounds: Normal breath sounds. No rales.  Abdominal:     General: Abdomen is flat. Bowel sounds are normal.     Palpations: Abdomen is soft.  Musculoskeletal:        General: No swelling.     Cervical back: Neck supple.  Skin:    General: Skin is warm and dry.  Neurological:     General: No focal deficit present.     Mental Status: She is alert and oriented to person, place, and time.  Psychiatric:        Mood and Affect: Mood  normal.     Labs reviewed: Basic Metabolic Panel: Recent Labs    03/13/20 0810 07/28/20 0913  NA 140 141  K 4.3 4.3  CL 103 105  CO2 30 28  GLUCOSE 121* 111*  BUN 24 25  CREATININE 0.99* 1.09*  CALCIUM 9.5 9.0   Liver Function Tests: Recent Labs    07/28/20 0913 08/10/20 1225 09/26/20 1406  AST _1 ALT _2 ALKPHOS  --  76 83  BILITOT 0.5 0.3 0.4  PROT 6.6 7.4 7.0  ALBUMIN  --  4.1 3.9   No results for input(s): LIPASE, AMYLASE in the last 8760 hours. No results for input(s): AMMONIA in the last 8760 hours. CBC: Recent Labs    03/13/20 0810 07/28/20 0913  WBC 8.6 8.5  NEUTROABS 4,678 4,488  HGB 15.1 14.5  HCT 45.5* 43.2  MCV 86.5 87.6  PLT 261 260   Lipid Panel: Recent Labs    03/13/20 0810 07/28/20 0913  CHOL 203* 195  HDL 52 55  LDLCALC 126* 115*  TRIG 141 133  CHOLHDL 3.9 3.5   Lab Results  Component Value Date   HGBA1C 5.9 (H) 07/28/2020    Procedures since last visit: CUP PACEART REMOTE DEVICE CHECK  Result Date: 10/16/2020 Unscheduled manual transmission received. AF burden 0.6%, longest 9hrs. Rates controlled. [place]- Eliquis. Follow up as scheduled  CUP PACEART REMOTE DEVICE CHECK  Result Date: 10/13/2020 Scheduled remote reviewed. Normal device function.  Next remote 91 days.   Assessment/Plan ILD Is doing well On Potable oxygen  Will restart Esbriet after Thanksgiving at lower dose OSA on CPAP Doing well Arthritis Follows with Ortho Worked with therapy  Doing better now Reactive depression On Zoloft Uses Valium PRN Seems to be doing better PAF (paroxysmal atrial fibrillation) (H[facility]) On Eliquis  Follows with Cardiology Hyperlipidemia, unspecified hyperlipidemia type Will Recheck Prediabetes Recheck A1C   Up To date on All vaccinations including Shingles Got Mammogram and DEXA at [facility]  Labs/tests ordered:  * No order type specified * Next appt:  04/16/2021

## 2020-10-30 ENCOUNTER — Telehealth: Payer: Self-pay | Admitting: Internal Medicine

## 2020-10-31 NOTE — Telephone Encounter (Signed)
Spoke with pt, states that she was supposed to restart her Esbriet this week but has not been able to start as prescribed, as she has not received her rx from the pharmacy. Per chart it looks like rx was sent to [facility] pharmacy on 11/17.  Called [facility] pharmacy to check status of rx; was on hold over 10 minutes. Left a VM for a representative to return our call and update on status of rx.

## 2020-11-02 NOTE — Telephone Encounter (Signed)
Pt calling back regarding esbriet. [phone]

## 2020-11-02 NOTE — Telephone Encounter (Signed)
Called and spoke with patient to let her know that we are trying to get an update on her order for Esbriet but have not been able to get one as of yet and that we would call her as soon as we knew something.   Called [facility] pharmacy to check status of rx; was on hold over 10 minutes. Left a VM for a representative to return our call and update on status of rx.

## 2020-11-08 ENCOUNTER — Telehealth: Payer: Self-pay | Admitting: Cardiology

## 2020-11-08 DIAGNOSIS — M67912 Unspecified disorder of synovium and tendon, left shoulder: Secondary | ICD-10-CM | POA: Diagnosis not present

## 2020-11-08 NOTE — Telephone Encounter (Signed)
Eliquis 5 mg #3 lot [id number] Exp 2/24 at front for pick up Advised patient

## 2020-11-08 NOTE — Telephone Encounter (Signed)
Patient calling the office for samples of medication:   1.  What medication and dosage are you requesting samples for? apixaban (ELIQUIS) 5 MG TABS tablet  2.  Are you currently out of this medication? No

## 2020-11-09 ENCOUNTER — Other Ambulatory Visit: Payer: Self-pay

## 2020-11-09 ENCOUNTER — Ambulatory Visit (INDEPENDENT_AMBULATORY_CARE_PROVIDER_SITE_OTHER): Payer: Medicare Other | Admitting: Internal Medicine

## 2020-11-09 ENCOUNTER — Encounter: Payer: Self-pay | Admitting: Internal Medicine

## 2020-11-09 VITALS — BP 130/72 | HR 80 | Temp 97.6°F | Ht 68.0 in | Wt 274.4 lb

## 2020-11-09 DIAGNOSIS — J84112 Idiopathic pulmonary fibrosis: Secondary | ICD-10-CM | POA: Diagnosis not present

## 2020-11-09 DIAGNOSIS — G4733 Obstructive sleep apnea (adult) (pediatric): Secondary | ICD-10-CM | POA: Diagnosis not present

## 2020-11-09 DIAGNOSIS — R5383 Other fatigue: Secondary | ICD-10-CM

## 2020-11-09 DIAGNOSIS — Z5181 Encounter for therapeutic drug level monitoring: Secondary | ICD-10-CM | POA: Diagnosis not present

## 2020-11-09 DIAGNOSIS — Z9989 Dependence on other enabling machines and devices: Secondary | ICD-10-CM

## 2020-11-09 LAB — HEPATIC FUNCTION PANEL
ALT: 16 U/L (ref 0–35)
AST: 22 U/L (ref 0–37)
Albumin: 4.2 g/dL (ref 3.5–5.2)
Alkaline Phosphatase: 72 U/L (ref 39–117)
Bilirubin, Direct: 0.1 mg/dL (ref 0.0–0.3)
Total Bilirubin: 0.3 mg/dL (ref 0.2–1.2)
Total Protein: 7.9 g/dL (ref 6.0–8.3)

## 2020-11-09 NOTE — Telephone Encounter (Signed)
Called [facility] at [phone] and spoke to Paloma. She states they have been trying to reach the pt to get the script sent to the pt's home but they havent been able to speak to the pt.   Called the pt and informed her of the information and gave her the phone number listed above to give the verbal "OK" to send her Esbriet. Pt states she will call before her appt today with MR. Pt aware to contact us back if there are any issues. Will close encounter.   Will forward to MR as FYI since pt has an appt today at 1130.

## 2020-11-09 NOTE — Progress Notes (Signed)
e former smoker followed for dyspnea on exertion, lung nodules, fibrosis, complicated by A. 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 She does not qualify for portable oxygen CT chest 01/06/17-pulmonary fibrosis, small nodules ANA 1:160 PFT 07/19/2019- mild restriction. FVC 2.94/ 93%, FEV1 2.20/ 107%, R .86, FEF25-75% 3.48/ 110%, TLC 74%, DLCO 81%, no resp to BD ------------------------------------------------------------------------------------------  09/21/2018- 79 year old female former smoker followed for dyspnea on exertion, lung nodules, fibrosis, ANA 8:366, complicated by A. 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 Feeling Sob with activity today. Previously completed pulmonary rehab. Body weight 250 lbs No acute changes in her breathing.  Notices a full/bloated feeling especially after meals, but little wheeze or cough.  Still followed by cardiology for A. Fib/pacemaker. Gradually adjusting to loss of daughter who died from end-stage lung disease related to her alpha-1 antitrypsin status. CT chest 04/03/2018- IMPRESSION: 1. Pulmonary nodules are stable to slightly decreased back to 01/06/2017 chest CT, considered benign. 2. 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 No frank honeycombing. No convincing progression since 01/06/2017 high-resolution chest CT study. 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 Usual interstitial pneumonia is unlikely given absence of progression and presence of air trapping. 3. Stable mild patchy air trapping  in both lungs indicative of small airways disease. Aortic Atherosclerosis (ICD10-I70.0).  07/19/2019- 79 year old female former smoker followed for dyspnea on exertion, lung nodules, fibrosis, ANA 2:947, complicated by A. 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 TKR deferred due to Covid. Activity limited to ADLs and not losing weight.  Discussed her CT showing mild stable fibrosis. Also reviewed Echo and PFT. She is anxious, still grieving death of daughter from a1AT resp failure. She presses me to support trial of glutathione because it made her daughter "look better in the face". She wants to see Dr Chase Caller for his expertise re interstitial lung disease, although she is aware of mild chronic stable fibrosis and stable nodules.   PFT 07/19/2019- mild restriction. FVC 2.94/ 93%, FEV1 2.20/ 107%, R .86, FEF25-75% 3.48/ 110%, TLC 74%, DLCO 81%, no resp to BD  ECHO- 05/18/2019-  1. The left ventricle has low normal systolic function, with an ejection fraction of 50-55%. The cavity size was normal. Left ventricular diastolic function could not be evaluated.  2. The right ventricle has normal systolic function. The cavity was normal.  3. The mitral valve is grossly normal.  4. The tricuspid valve is grossly normal.  5. The aortic valve is tricuspid. Mild thickening of the aortic valve. No stenosis of the aortic valve.  6. Normal LV systolic function; no significant valvular heart disease.  CT chest 04/05/2019- IMPRESSION: No acute cardiopulmonary disease. Mild chronic stable fibrotic change. Several small subcentimeter mostly peripheral pulmonary nodules with the largest measuring 7 mm over the posterolateral right lower lobe. These are unchanged for greater than 2 years and therefore considered benign. Aortic Atherosclerosis (ICD10-I70.0).   OV  07/21/2019  Subjective:  Patient ID: [name], female , DOB: Apr 19, 1941 , age 79 y.o. , MRN: [id number] ,  ADDRESS: [address] [place] [address]   07/21/2019 -   Chief Complaint  Patient presents with  . Follow-up    SOB worsening over past 6 months - increased weight gain     HPI [name] 79 y.o. -is a friend ofl 1 of my pulmonary fibrosis patients.  She is also a patient of Dr. Baird Lyons in our clinic as described above.  She is here to the ILD clinic for second opinion about her pulmonary fibrosis.  She tells me that she was diagnosed with pulmonary fibrosis a few years ago and it has been stable.  Is been of insidious onset.  She had limited autoimmune profile as documented below in 2018 when she had a positive ANA but of low titer.  Her dyspnea is been stable.  She is dealing with other medical issues such as obesity, using a walker because of knee issues and a stroke and also death of her daughter from alpha-1 antitrypsin disease approximately 2 years ago.  She says she has some dyspnea on exertion relieved by rest but overall this is stable.  The dyspnea is definitely significant and out of proportion to the level of ILD as documented by her pulmonary function test which shows stability.  Her CT high-resolution that I was personally visualized from a year ago and his CT chest from 2020 showed bilateral bibasal reticular pattern but no honeycombing or traction bronchiectasis.  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  I do not see any features of alternate diagnosis.  She denies any autoimmune features.  She denies having mold in the house of pet birds.  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       OV 08/10/2020   Subjective:  Patient ID: [name], female , DOB: [birth year], age 79 y.o. years. , MRN: [id number],  ADDRESS: [address] [place] [address] PCP  [name], MD Providers : Treatment Team:   Attending Provider: Brand Males, MD   Chief Complaint  Patient presents with  . 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  Indeterminate in 2020 CT scan.  Autoimmune negative except for ANA 1: 160.  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  [Daughter died from alpha-1 CC]  Obesity   Diastolic dysfunction  Atrial fibrillation on Eliquis  Obstructive sleep apnea on CPAP without oxygen   HPI LEILANA MCQUIRE 79 y.o. -last seen just over a year ago.  Since then not seen.  She tells me that she think she might be a little bit worse in terms of her shortness of breath than before.  But at the same time she says she is morbidly obese and has cardiac issues.  She also has shoulder rotator cuff tear issues.  She feels that she might be short of breath because of that.  In terms of ILD symptom questionnaire is documented below.  Symptoms are actually a little bit better than last year.  She had pulmonary function test and this is stable.  She had high-resolution CT chest.  Dr. 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  In terms of her walking desaturation test: desaturated -she desaturated the end of 1 lap.  She required 2 L of nasal  cannula to correct.  She said that after that she started feeling better.  She also reported that when she did physical therapy she felt as exhausted and sleeps a lot.   Noted that she is on polypharmacy has obesity, heart disease and is on Eliquis   OV 09/26/2020   Subjective:  Patient ID: [name], female , DOB: 12-22-1940, age 89 y.o. years. , MRN: [id number],  ADDRESS: [address] [place] [address] PCP  Virgie Dad, MD Providers : Treatment Team:  Attending Provider: Brand Males, MD Patient Care Team: Virgie Dad, MD as PCP - General  (Internal Medicine) Sanda Klein, MD as PCP - Cardiology (Cardiology) Maisie Fus, MD as Consulting Physician (Obstetrics and Gynecology) Richmond Campbell, MD as Consulting Physician (Gastroenterology) Croitoru, Dani Gobble, MD as Consulting Physician (Cardiology) Kathie Rhodes, MD (Inactive) as Consulting Physician (Urology) Danella Sensing, MD as Consulting Physician (Dermatology) Brand Males, MD as Consulting Physician (Pulmonary Disease) Bo Merino, MD as Consulting Physician (Rheumatology) Barbaraann Cao, OD as Referring Physician (Optometry) Berenice Primas, MD as Referring Physician (Orthopedic Surgery)    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  Indeterminate in 01/28/19 CT scan.  Autoimmune negative except for ANA 1: 160.  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  [Daughter died from alpha-1 CC]  Obesity   Diastolic dysfunction  Atrial fibrillation on Eliquis  Obstructive sleep apnea on CPAP without oxygen  Chief Complaint  Patient presents with  . Follow-up    ILD, still SOB, cannot get O2 from [employer]       HPI ANDERIA LORENZO 79 y.o. -returns for follow-up of IPF.  This visit is to make sure that pirfenidone start has gone well.  She says she is now into 4 weeks of pirfenidone.  She is taking 3 pills 3 times daily for the last 1 week.  So far no GI site issues.  Her baseline symptom profile includes fatigue and it is stable.  Dyspnea stable.  She is frustrated by the lack of having portable oxygen system.  She says the oxygen DME company is yet to provide her.  Apparently my office did not do the right paperwork but she thinks it is the DME  company.  Nevertheless she wants to try another DME company.  She also has some occasional mild intermittent epistaxis by using her oxygen which dries her nose up.  Otherwise overall stable.  She is inquiring about the Covid booster.  She has not yet had a flu shot but will have it today.    OV 11/09/2020   Subjective:  Patient ID: [name], female , DOB: [birth year], age 24 y.o. years. , MRN: [id number],  ADDRESS: [address] [place] [address] PCP  [name], MD Providers : Treatment Team:  Attending Provider: Brand Males, MD Patient Care Team: [name], MD as PCP - General (Family Medicine) Sanda Klein, MD as PCP - Cardiology (Cardiology) Maisie Fus, MD as Consulting Physician (Obstetrics and Gynecology) Richmond Campbell, MD as Consulting Physician (Gastroenterology) Croitoru, Dani Gobble, MD as Consulting Physician (Cardiology) Kathie Rhodes, MD (Inactive) as Consulting Physician (Urology) Danella Sensing, MD  as Consulting Physician (Dermatology) Brand Males, MD as Consulting Physician (Pulmonary Disease) Bo Merino, MD as Consulting Physician (Rheumatology) Barbaraann Cao, OD as Referring Physician (Optometry) Berenice Primas, MD as Referring Physician (Orthopedic Surgery)    Chief Complaint  Patient presents with  . Follow-up    ILD, doing ok, feels she is forgetting more and not thinking clearly.      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 -returns for follow-up of IPF.  This visit is to ensure continued tolerance of pirfenidone and to address other issues.  She tells me that approximately a week ago because of severe fatigue she stopped the pirfenidone.  After this the fatigue resolved.  She is wondering about future options in her therapy.  We discussed nintedanib but she is on Eliquis and she has a history of congestive heart failure although not necessarily coronary artery disease.  She is a little bit wary about starting nintedanib because of this.  We discussed the other option of restarting the the pirfenidone but going for a submaximal dose of 2 pills 3 times daily with gradual up titration.  She is open to this.  Therefore we will start at 1 pill 3 times daily which she will continue through the end of this year/month and then next month/next day we will start at 2 pills 3 times daily which would be her maximum dose.  She use oxygen with exertion.  The overnight oxygen saturation test has not been done yet.  We do not know why.  It is possible that she might not be answering her phone calls.       SYMPTOM SCALE - ILD 07/21/2019  08/10/2020 262# 09/26/2020 Last Weight  Most recent update: 09/26/2020  1:42 PM   Weight  120.9 kg (266 lb 9.6 oz)            11/09/2020 274#  O2 use ra ra ra o2 with ex  Shortness of Breath 0 -> 5 scale with 5 being worst (score 6 If unable to do)     At rest 0 0  5  Simple tasks - showers, clothes change, eating, shaving _0 Household (dishes, doing bed, laundry) _1 Shopping _2 Walking level at own pace _3 Walking up Stairs _4 Total (40 - 48) Dyspnea Score _5 How bad is your cough? 1 1-_6 How bad is your fatigue _7 nausea  0 0 0  vomit  0 0 0  diarrhea  0 0 0  axiety  2 1 0   depression  2 1.5 2  Simple office walk 185 feet x  3 laps goal with forehead probe 07/21/2019  08/10/2020   O2 used ra ra  Number laps completed 3 laps 3  Comments about pace modertae with walker Slow pace  Resting Pulse Ox/HR 97% and 78/min 100% and 74/min  Final Pulse Ox/HR 93% and 109/min 77% and 85/min  Desaturated </= 88% no no  Desaturated <= 3% points Yes, 4 Yes, 13 points  Got Tachycardic >/= 90/min you have pulmonary fibrosis otherwise call interstitial lung disease yes no  Symptoms at end of test dyspnea Mild dyspnea  Miscellaneous comments x Corrected with [facility] and got      PFT Results Latest Ref Rng & Units 08/10/2020 07/19/2019 02/24/2017  FVC-Pre L 2.57 2.74 2.58  FVC-Predicted Pre % 84 93 85  FVC-Post L - 2.63 2.45  FVC-Predicted Post % - 89 81  Pre FEV1/FVC % % 86 86 82  Post FEV1/FCV % % - 90 87  FEV1-Pre L 2.22 2.36 2.12  FEV1-Predicted Pre % 97 107 93  FEV1-Post L - 2.35 2.14  DLCO uncorrected ml/min/mmHg 15.83 16.52 16.15  DLCO UNC% % 76 81 59  DLCO corrected ml/min/mmHg 15.34 - 14.93  DLCO COR %Predicted % 73 - 55  DLVA Predicted % 97 104 79  TLC L 4.50 3.97 -  TLC % Predicted % 81 74 -  RV % Predicted % 71 52 -       Results for AERIONNA, MORAVEK (MRN [id number]) as of 07/21/2019 15:57  Ref. Range 02/24/2017 15:55  Anti Nuclear Antibody (ANA) Latest Ref Range: NEGATIVE  POS (A)  ANA Pattern 1 Unknown HOMOGENEOUS (A)  ANA Titer 1 Latest Units: titer 1:160 (H)  RA Latex Turbid. Latest Ref Range: <14 IU/mL <14     CT chest high resolution June 2021   CLINICAL DATA:  79 year old female with history of interstitial lung disease.  EXAM: CT CHEST WITHOUT CONTRAST  TECHNIQUE: Multidetector CT imaging of the chest was performed following the standard protocol without intravenous contrast. High resolution imaging of the lungs, as well as inspiratory and expiratory imaging, was performed.  COMPARISON:  Chest CT  04/05/2019.  FINDINGS: Cardiovascular: Heart size is normal. There is no significant pericardial fluid, thickening or pericardial calcification. There is aortic atherosclerosis, as well as atherosclerosis of the great vessels of the mediastinum and the coronary arteries, including calcified atherosclerotic plaque in the left circumflex coronary artery.  Mediastinum/Nodes: No pathologically enlarged mediastinal or hilar lymph nodes. Please note that accurate exclusion of hilar adenopathy is limited on noncontrast CT scans. Small hiatal hernia. No axillary lymphadenopathy.  Lungs/Pleura: Multiple small pulmonary nodules are noted throughout the lungs bilaterally, largest of which is in the right lower lobe (axial image 160 of series 3) measuring 7 x 3 mm (mean diameter 5 mm). These are stable in size and number compared to prior examinations dating back to 2018. No other larger more suspicious appearing pulmonary nodules or masses are noted. No acute consolidative airspace disease. No pleural effusions. High-resolution images demonstrates some patchy areas with mild peripheral predominant ground-glass attenuation, septal thickening, some scattered areas of subpleural reticulation, peripheral predominant traction bronchiectasis and bronchiolectasis, and possible (but not definitive) early honeycombing most evident in the periphery of the left lower lobe. These findings have a definitive craniocaudal gradient and appear mildly progressive compared to the prior examination. Inspiratory and expiratory imaging demonstrates mild air trapping indicative of small airways disease.  Upper Abdomen: Aortic atherosclerosis. Calcified granuloma in the liver.  Musculoskeletal: There are no aggressive appearing lytic or blastic lesions noted in the visualized portions of the skeleton.  IMPRESSION: 1. The appearance of the lungs is compatible with interstitial lung disease, which appears  mildly progressive compared to the prior examination, with a spectrum of findings considered probable usual interstitial pneumonia (UIP) per current ATS guidelines. 2. Small pulmonary nodules measuring 5 mm or less in size, stable dating back to 2018, considered definitively benign. 3. Aortic atherosclerosis, in addition to left circumflex coronary artery disease. Please note that although the presence of coronary artery calcium documents the presence of coronary artery disease, the severity of this disease and any potential stenosis cannot be assessed on this non-gated CT examination. Assessment for potential risk factor modification, dietary therapy or pharmacologic therapy may be warranted, if clinically indicated.  Aortic Atherosclerosis (ICD10-I70.0).   Electronically Signed   By: Vinnie Langton M.D.   On: 06/01/2020 10:20     has a past medical history of Alpha-1-antitrypsin deficiency carrier, Arthritis, Atherosclerosis of aorta ([place]), Atrial fibrillation ([place]), Back pain, Complication of anesthesia, Diastolic dysfunction, Frequent UTI, GERD (gastroesophageal reflux disease), Hypertension, Hypertriglyceridemia, Insulin resistance, Long term current use of anticoagulant, Multiple lung nodules, Muscular deconditioning, MVP (mitral valve prolapse), Obesity, OSA on CPAP, Osteoarthritis, Osteopenia, Pacemaker, PAF (paroxysmal atrial fibrillation) ([place]), Positive ANA (antinuclear antibody), Presbyesophagus, Presence of permanent cardiac pacemaker, Primary osteoarthritis of both hands, Pulmonary fibrosis ([place]), PVC's (premature ventricular contractions), Shortness of breath, Sinus arrest (10/2014), Sleep apnea, Stroke ([place]) (01/2014), Tachy-brady syndrome ([place]) (10/2014), TIA (transient ischemic attack), and Vitamin D deficiency.   reports that she quit smoking about 50 years ago. Her smoking use included cigarettes. She has a 36.00 pack-year smoking history. She has never used smokeless  tobacco.  Past Surgical History:  Procedure Laterality Date  . CARDIOVERSION N/A 09/02/2017   Procedure: CARDIOVERSION;  Surgeon: Sanda Klein, MD;  Location: [facility];  Service: Cardiovascular;  Laterality: N/A;  . COLONOSCOPY  2019  . ESOPHAGEAL DILATION    . EXCISION VAGINAL CYST     benign nodules  . INSERT / REPLACE / REMOVE PACEMAKER  10/04/2014   [employer] Advisa model [id number] serial number [id number]  . LOOP RECORDER EXPLANT N/A 10/04/2014   Procedure: LOOP RECORDER EXPLANT;  Surgeon: Sanda Klein, MD;  Location: [facility];  Service: Cardiovascular;  Laterality: N/A;  . LOOP RECORDER IMPLANT N/A 04/19/2014   Procedure: LOOP RECORDER IMPLANT;  Surgeon: Sanda Klein, MD;  Location: [facility];  Service: Cardiovascular;  Laterality: N/A;  . NM MYOCAR PERF WALL MOTION  12/18/2007   normal  . PERMANENT PACEMAKER INSERTION N/A 10/04/2014   Procedure: PERMANENT PACEMAKER INSERTION;  Surgeon: Sanda Klein, MD;  Location: [facility] LAB;  Service: Cardiovascular;  Laterality: N/A;  . TUBAL LIGATION    . US ECHOCARDIOGRAPHY  05/15/2010   LA mildly dilated,mild mitral annular ca+, AOV mildly sclerotic, mild asymmetric LVH    Allergies  Allergen Reactions  . Ancef [Cefazolin] Hives and Itching  . Pseudoephedrine Hcl     Other reaction(s): palpitations (moderate)  . Ergotamine   . Pentobarbital   . Caffeine Palpitations    Immunization History  Administered Date(s) Administered  . Influenza Whole 09/23/2008  . Influenza, High Dose Seasonal PF 09/13/2018, 09/27/2019, 09/26/2020  . Influenza,inj,quad, With Preservative 09/04/2017  . Influenza-Unspecified 09/17/2014, 09/09/2016, 09/04/2017  . Moderna SARS-COVID-2 Vaccination 12/06/2019, 01/03/2020  . Pneumococcal Conjugate-13 10/16/2015  . Pneumococcal Polysaccharide-23 10/02/2017  . Pneumococcal-Unspecified 10/01/2014, 10/02/2017  . Zoster Recombinat (Shingrix) 09/29/2018, 01/19/2019  Family History  Problem Relation  Age of Onset  . Heart attack Mother   . Sudden death Mother   . Stroke Father   . Heart failure Father   . Alcoholism Brother   . Healthy Sister   . Stroke Brother   . Alpha-1 antitrypsin deficiency Daughter   . Breast cancer Daughter   . Prostate cancer Brother   . Stroke Brother   . Prostate cancer Brother   . Healthy Brother   . Prostate cancer Brother   . Healthy Daughter   . Diabetes Son   . Arthritis Son      Current Outpatient Medications:  .  apixaban (ELIQUIS) 5 MG TABS tablet, Take 1 tablet (5 mg total) by mouth 2 (two) times daily., Disp: 180 tablet, Rfl: 3 .  B Complex-C (SUPER B COMPLEX PO), Take 1 tablet by mouth daily., Disp: , Rfl:  .  Biotin 5000 MCG CAPS, Take 5,000 mcg by mouth daily. , Disp: , Rfl:  .  Calcium Carbonate-Vit D-Min (CALCIUM 1200 PO), Take 1,200 mg by mouth daily. , Disp: , Rfl:  .  cholecalciferol (VITAMIN D) 1000 UNITS tablet, Take 6,000 Units by mouth daily. , Disp: , Rfl:  .  Co-Enzyme Q10 100 MG CAPS, Take 100 mg by mouth daily. , Disp: , Rfl:  .  diazepam (VALIUM) 5 MG tablet, TAKE 1 TABLET EACH DAY. (Patient taking differently: Take 5 mg by mouth daily as needed for anxiety. TAKE 1 TABLET EACH DAY.), Disp: 30 tablet, Rfl: 0 .  fluticasone (FLONASE) 50 MCG/ACT nasal spray, Place 2 sprays into both nostrils as needed for allergies or rhinitis., Disp: , Rfl:  .  folic acid (FOLVITE) 497 MCG tablet, Take 400 mcg by mouth daily., Disp: , Rfl:  .  glucosamine-chondroitin 500-400 MG tablet, Take 1 tablet by mouth 2 (two) times daily. , Disp: , Rfl:  .  loratadine (CLARITIN) 10 MG tablet, Take 10 mg by mouth daily., Disp: , Rfl:  .  MAGNESIUM CITRATE PO, Take 400 mg by mouth daily. 1 tab daily, Disp: , Rfl:  .  nitroGLYCERIN (NITROSTAT) 0.4 MG SL tablet, Place 1 tablet (0.4 mg total) under the tongue every 5 (five) minutes as needed for chest pain., Disp: 25 tablet, Rfl: 1 .  omeprazole (PRILOSEC) 20 MG capsule, Take 40 mg by mouth daily. , Disp: ,  Rfl:  .  Pirfenidone (ESBRIET) 267 MG TABS, Take 1 tab TID for 3 weeks, then increase to 2 tabs TID and stay at this dose., Disp: 180 tablet, Rfl: 2 .  Resveratrol 250 MG CAPS, Take 250 mg by mouth daily., Disp: , Rfl:  .  sertraline (ZOLOFT) 100 MG tablet, TAKE 1 TABLET EACH DAY., Disp: 90 tablet, Rfl: 0 .  TURMERIC PO, Take 1,000 mg by mouth daily., Disp: , Rfl:       Objective:   Vitals:   11/09/20 1140  BP: 130/72  Pulse: 80  Temp: 97.6 F (36.4 C)  TempSrc: Oral  SpO2: 93%  Weight: 274 lb 6.4 oz (124.5 kg)  Height: 5' 8" (1.727 m)    Estimated body mass index is 41.72 kg/m as calculated from the following:   Height as of this encounter: 5' 8" (1.727 m).   Weight as of this encounter: 274 lb 6.4 oz (124.5 kg).  _0 @  Filed Weights   11/09/20 1140  Weight: 274 lb 6.4 oz (124.5 kg)     Physical Exam   General: No distress. obese Neuro:  Alert and Oriented x 3. GCS 15. Speech normal Psych: Pleasant Resp:  Barrel Chest - no.  Wheeze - no, Crackles - no, No overt respiratory distress CVS: Normal heart sounds. Murmurs - no Ext: Stigmata of Connective Tissue Disease - no HEENT: Normal upper airway. PEERL +. No post nasal drip        Assessment:       ICD-10-CM   1. IPF (idiopathic pulmonary fibrosis) (HCC)  J84.112 Hepatic function panel    Hepatic function panel  2. Medication monitoring encounter  Z51.81 Hepatic function panel    Hepatic function panel  3. Other fatigue  R53.83   4. OSA on CPAP, compliant  G47.33 Pulse oximetry, overnight   Z99.89        Plan:     Patient Instructions     ICD-10-CM   1. IPF (idiopathic pulmonary fibrosis) ([place])  J84.112   2. Medication monitoring encounter  Z51.81   3. OSA on CPAP, compliant  G47.33    Z99.89   4. Other fatigue  R53.83    Pulmonary fibrosis itself is stable compared to last visit You have had significant fatigue because of full dose pirfenidone  -Glad fatigue is better after  pirfenidone holiday  We discussed several options about how to manage moving forward and we decided we would give low-dose pirfenidone or try  Plan -Check liver function test today -Take pirfenidone 1 pill 3 times daily with food till Dec 01, 2020 -Starting Dec 02, 2020 increase pirfenidone to 2 pills 3 times daily with food  -This will be your maximum dose for pirfenidone.  We will not escalate further -We can consider nintedanib in the future if the low-dose pirfenidone also fails  -Currently we are not considering nintedanib because of associated Eliquis intake  -Continue oxygen with exertion -Test overnight oxygen on room air at night with your CPAP [it is unclear why the order did not go through in the past]  Follow-up -Return in  middle to end Jan 2022 with nurse practitioner to ensure pirfenidone tolerance is going well  -Check liver function test at that time as well  - Discuss other pillars and ILD management at follow-up     SIGNATURE    Dr. Brand Males, M.D., F.C.C.P, thank you Pulmonary and Critical Care Medicine Staff Physician, [place] Director - Interstitial Lung Disease  Program  [facility] [place], [place], [address]  Pager: [phone], If no answer or between  15:00h - 7:00h: call [phone] Telephone: [phone]  12:31 PM 11/09/2020

## 2020-11-09 NOTE — Patient Instructions (Addendum)
ICD-10-CM   1. IPF (idiopathic pulmonary fibrosis) ([place])  J84.112   2. Medication monitoring encounter  Z51.81   3. OSA on CPAP, compliant  G47.33    Z99.89   4. Other fatigue  R53.83    Pulmonary fibrosis itself is stable compared to last visit You have had significant fatigue because of full dose pirfenidone  -Glad fatigue is better after pirfenidone holiday  We discussed several options about how to manage moving forward and we decided we would give low-dose pirfenidone or try  Plan -Check liver function test today -Take pirfenidone 1 pill 3 times daily with food till Dec 01, 2020 -Starting Dec 02, 2020 increase pirfenidone to 2 pills 3 times daily with food  -This will be your maximum dose for pirfenidone.  We will not escalate further -We can consider nintedanib in the future if the low-dose pirfenidone also fails  -Currently we are not considering nintedanib because of associated Eliquis intake  -Continue oxygen with exertion -Test overnight oxygen on room air at night with your CPAP [it is unclear why the order did not go through in the past]  Follow-up -Return in  middle to end Jan 2022 with nurse practitioner to ensure pirfenidone tolerance is going well  -Check liver function test at that time as well  - Discuss other pillars and ILD management at follow-up

## 2020-11-10 NOTE — Progress Notes (Signed)
Lft normal on esbriet.   Lab             11/09/20                      1230         AST          22           ALT          16           ALKPHOS      72           BILITOT      0.3          PROT         7.9          ALBUMIN      4.2

## 2020-11-13 ENCOUNTER — Other Ambulatory Visit: Payer: Self-pay | Admitting: Internal Medicine

## 2020-11-13 DIAGNOSIS — F331 Major depressive disorder, recurrent, moderate: Secondary | ICD-10-CM

## 2020-11-20 DIAGNOSIS — Z6841 Body Mass Index (BMI) 40.0 and over, adult: Secondary | ICD-10-CM | POA: Diagnosis not present

## 2020-11-20 DIAGNOSIS — F331 Major depressive disorder, recurrent, moderate: Secondary | ICD-10-CM | POA: Diagnosis not present

## 2020-11-20 DIAGNOSIS — F419 Anxiety disorder, unspecified: Secondary | ICD-10-CM | POA: Diagnosis not present

## 2020-11-20 DIAGNOSIS — J9611 Chronic respiratory failure with hypoxia: Secondary | ICD-10-CM | POA: Diagnosis not present

## 2020-11-20 DIAGNOSIS — Z0000 Encounter for general adult medical examination without abnormal findings: Secondary | ICD-10-CM | POA: Diagnosis not present

## 2020-11-22 ENCOUNTER — Ambulatory Visit: Payer: Medicare Other | Admitting: Primary Care

## 2020-11-30 ENCOUNTER — Ambulatory Visit: Payer: Medicare Other | Admitting: Cardiovascular Disease

## 2020-11-30 ENCOUNTER — Telehealth: Payer: Self-pay | Admitting: Pharmacist

## 2020-11-30 DIAGNOSIS — I480 Paroxysmal atrial fibrillation: Secondary | ICD-10-CM

## 2020-11-30 MED ORDER — APIXABAN 5 MG PO TABS: 5.0000 mg | ORAL_TABLET | Freq: Two times a day (BID) | ORAL | 0 refills | Status: DC

## 2020-11-30 NOTE — Telephone Encounter (Signed)
Medication Samples have been provided to the patient.  Drug name: Eliquis       Strength: 63m      Qty: 28  LOT:: [id number] Exp.Date: 4/23  Dosing instructions: Take 1 tablet by mouth twice a day  The patient has been instructed regarding the correct time, dose, and frequency of taking this medication, including desired effects and most common side effects.   CRollen Sox11:57 AM 11/30/2020

## 2020-12-04 ENCOUNTER — Telehealth: Payer: Self-pay | Admitting: Primary Care

## 2020-12-04 ENCOUNTER — Encounter: Payer: Self-pay | Admitting: Primary Care

## 2020-12-04 ENCOUNTER — Ambulatory Visit (INDEPENDENT_AMBULATORY_CARE_PROVIDER_SITE_OTHER): Payer: Medicare Other | Admitting: Primary Care

## 2020-12-04 ENCOUNTER — Other Ambulatory Visit: Payer: Self-pay

## 2020-12-04 VITALS — BP 118/70 | HR 85 | Ht 68.0 in | Wt 274.8 lb

## 2020-12-04 DIAGNOSIS — J8410 Pulmonary fibrosis, unspecified: Secondary | ICD-10-CM

## 2020-12-04 DIAGNOSIS — G4733 Obstructive sleep apnea (adult) (pediatric): Secondary | ICD-10-CM

## 2020-12-04 DIAGNOSIS — J84112 Idiopathic pulmonary fibrosis: Secondary | ICD-10-CM

## 2020-12-04 DIAGNOSIS — Z9989 Dependence on other enabling machines and devices: Secondary | ICD-10-CM

## 2020-12-04 LAB — HEPATIC FUNCTION PANEL
ALT: 13 U/L (ref 0–35)
AST: 19 U/L (ref 0–37)
Albumin: 4.1 g/dL (ref 3.5–5.2)
Alkaline Phosphatase: 72 U/L (ref 39–117)
Bilirubin, Direct: 0.1 mg/dL (ref 0.0–0.3)
Total Bilirubin: 0.3 mg/dL (ref 0.2–1.2)
Total Protein: 7.3 g/dL (ref 6.0–8.3)

## 2020-12-04 MED ORDER — ESBRIET 267 MG PO TABS: 534.0000 mg | ORAL_TABLET | Freq: Three times a day (TID) | ORAL | 11 refills | Status: DC

## 2020-12-04 NOTE — Assessment & Plan Note (Signed)
-  She has not had ONO on CPAP yet, order was placed again and sent to corrected DME company which is [facility]

## 2020-12-04 NOTE — Telephone Encounter (Signed)
Did you call this number- [phone]

## 2020-12-04 NOTE — Telephone Encounter (Signed)
Perfect thank you

## 2020-12-04 NOTE — Patient Instructions (Addendum)
Patient support/resources  Pulmonary fibrosis help center [phone]  Local number for patient support [phone]   Recommendations: - I sent a message to our pharmacy team about Esbriet shipment, would recommend you take 1 tablet three times a day until new shipment arrives then increase back to 2 tablets three times a day (SET an ALARM to remind you to take afternoon dose) - You need to call [facility] to have them service or replace portable oxygen concentrator that is not working- their number is [phone]  - ONO order was sent to [facility] and not [facility], we will re-send order   Orders: - Liver function testing today - Best fit oxygen with [facility]  - ONO on CPAP with [facility] (patient phone number [phone])  Follow-up: 6 weeks with Dr. Chase Caller ILD clinic- 30 mins

## 2020-12-04 NOTE — Telephone Encounter (Signed)
Yes - this is the number I left a VM on. I just ATC her again without success. Will f/u tomorrow and if needed, will conference call with patient and pharmacy to ensure shipment is scheduled. I included the pharmacy's phone number on my VM for her to be able to call.

## 2020-12-04 NOTE — Telephone Encounter (Signed)
Called [facility]. They have prescription with 11 refills for Esbriet.  Unable to reach patient. Left VM to advise that pharmacy has prescription. Left phone number to schedule shipment. Advised that each shipment must be scheduled, so when she has 7-10 days of medication left, she should reach out to schedule shipment.

## 2020-12-04 NOTE — Assessment & Plan Note (Addendum)
-  Stable interval; she is tolerating dose increase of pirfenidone without any major side effects. Currently taking 2 tabs Esbriet three times a day. This will be her maximum dose, no plan to increase further. She is awaiting shipment of medication, verified with pharmacy that specialty pharmacy has her prescription. ILD symptom scale was 19 today which appears in line with previous. Continues oxygen on exertion, needs portable concentrator serviced. She is interested in [employer], unsure if this will be lighter for her to carry but she will let us know if she wished for [place] to order. Needs repeat liver function testing today. Provided patient with pulmonary fibrosis help center/patient support phone number. FU in 6 weeks with Dr. Chase Caller.

## 2020-12-04 NOTE — Progress Notes (Signed)
_0  ID: [name] [name], female    DOB: Jul 03, 1941, 80 y.o.   MRN: [id number]  Chief Complaint  Patient presents with  . Follow-up    Pt states she has been doing okay since last visit. States she is more SOB since last visit. Also states she has been coughing more.    Referring provider: [name], MD  HPI: 80 year old female, former smoker quit 82 (36-year history).  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 fib, diastolic dysfunction, TIA, arthrosclerosis of aorta, GERD, insulin resistance.  Patient of Dr. Chase Caller, last seen in office on 11/09/2020. Maintained on pirfenidone, plan increased to 2 pills TID starting January 1st 2022.  12/04/2020- Interim hx Patient presents today for 1 month follow-up. She increased Esbriet to 2 tabs three times a day. She is tolerating medication without any significant side effects. She has one day worth of medication left. Awaiting shipment from specialty pharmacy. States that her portable oxygen concentrator is not working at all and is too heavy for her, she is interested in [employer]. She states that her current POC weighs about 4 lbs, the [employer]ogen concentrator is 2.8 lbs. She is willing to pay out of pocket, she will consider this and let us know if she wants a different portable concentrator. In the mean time she will call her [facility] to service the one that she has. She has not had ONO test on CPAP, looks as if order was sent to her old DME company which was [facility]. She denies nausea, vomiting, persistent diarrhea or weight loss.    SYMPTOM SCALE - ILD 07/21/2019  08/10/2020 262# 09/26/2020         Last Weight  Most recent update: 09/26/2020  1:42 PM   Weight  120.9 kg (266 lb 9.6 oz)            11/09/2020 274# 12/04/2020   O2 use ra ra ra o2 with ex O2 with ex  Shortness of Breath 0 -> 5 scale with 5 being worst (score 6 If unable to do)       At rest 0 0  5 0  Simple tasks - showers, clothes change, eating, shaving _1 Household (dishes, doing bed, laundry) _2 Shopping _3 Walking level at own pace _4 Walking up Stairs _5 Total (40 - 48) Dyspnea Score _6 How bad is your cough? 1 1-_7 How bad is your fatigue _8 nausea  0 0 0 0  vomit  0 0 0 0  diarrhea  0 0 0 0  axiety  2 1 0 0  depression  2 1.5 2 1.5    Allergies  Allergen Reactions  . Ancef [Cefazolin] Hives and Itching  . Pseudoephedrine Hcl     Other reaction(s): palpitations (moderate)  . Ergotamine   . Pentobarbital   . Caffeine Palpitations    Immunization History  Administered Date(s) Administered  . Influenza Split 09/13/2020  . Influenza Whole 09/23/2008  . Influenza, High Dose Seasonal PF 09/13/2018, 09/27/2019, 09/26/2020  . Influenza,inj,quad, With Preservative 09/04/2017  . Influenza-Unspecified 09/17/2014, 09/09/2016, 09/04/2017  . Moderna Sars-Covid-2 Vaccination 12/06/2019, 01/03/2020, 10/16/2020  . Pneumococcal Conjugate-13 09/29/2014, 10/16/2015  . Pneumococcal Polysaccharide-23 04/06/2009, 10/02/2017  . Pneumococcal-Unspecified 10/01/2014,  10/02/2017  . Td 04/05/1999  . Tdap 04/06/2009  . Zoster 12/02/2008, 09/29/2018, 01/19/2019  . Zoster Recombinat (Shingrix) 09/29/2018, 01/19/2019    Past Medical History:  Diagnosis Date  . Alpha-1-antitrypsin deficiency carrier   . Arthritis    "all over"  . Atherosclerosis of aorta ([place])   . Atrial fibrillation ([place])   . Back pain   . Complication of anesthesia    "I woke up during 2 different procedures" (10/04/2014)  . Diastolic dysfunction   . Frequent UTI   . GERD (gastroesophageal reflux disease)   . Hypertension   . Hypertriglyceridemia   . Insulin resistance   . Long term current use of anticoagulant   . Multiple lung nodules   . Muscular deconditioning   . MVP (mitral valve prolapse)   . Obesity   . OSA on  CPAP   . Osteoarthritis   . Osteopenia    unspecified location  . Pacemaker   . PAF (paroxysmal atrial fibrillation) ([place])   . Positive ANA (antinuclear antibody)   . Presbyesophagus   . Presence of permanent cardiac pacemaker   . Primary osteoarthritis of both hands    neck and spine and hips  . Pulmonary fibrosis ([place])    followed by pulmonolgy  . PVC's (premature ventricular contractions)   . Shortness of breath   . 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 Sleep apnea   . Stroke [facility]) 01/2014   denies deficits on 10/04/2014  . Tachy-brady syndrome ([place]) 10/2014  . TIA (transient ischemic attack)   . Vitamin D deficiency     Tobacco History: Social History   Tobacco Use  Smoking Status Former Smoker  . Packs/day: 2.00  . Years: 18.00  . Pack years: 36.00  . Types: Cigarettes  . Quit date: 07/18/1970  . Years since quitting: 50.4  Smokeless Tobacco Never Used  Tobacco Comment   "quit smoking in 1971"   Counseling given: Not Answered Comment: "quit smoking in 1971"   Outpatient Medications Prior to Visit  Medication Sig Dispense Refill  . apixaban (ELIQUIS) 5 MG TABS tablet Take 1 tablet (5 mg total) by mouth 2 (two) times daily. 180 tablet 3  . B Complex-C (SUPER B COMPLEX PO) Take 1 tablet by mouth daily.    . Biotin 5000 MCG CAPS Take 5,000 mcg by mouth daily.     . Calcium Carbonate-Vit D-Min (CALCIUM 1200 PO) Take 1,200 mg by mouth daily.     . cholecalciferol (VITAMIN D) 1000 UNITS tablet Take 6,000 Units by mouth daily.     Marland Kitchen Co-Enzyme Q10 100 MG CAPS Take 100 mg by mouth daily.     . diazepam (VALIUM) 5 MG tablet TAKE 1 TABLET EACH DAY. (Patient taking differently: Take 5 mg by mouth daily as needed for anxiety. TAKE 1 TABLET EACH DAY.) 30 tablet 0  . fluticasone (FLONASE) 50 MCG/ACT nasal spray Place 2 sprays into both nostrils as needed for allergies or rhinitis.    . folic acid (FOLVITE) 564 MCG tablet Take 400  mcg by mouth daily.    Marland Kitchen glucosamine-chondroitin 500-400 MG tablet Take 1 tablet by mouth 2 (two) times daily.     Marland Kitchen loratadine (CLARITIN) 10 MG tablet Take 10 mg by mouth daily.    Marland Kitchen MAGNESIUM CITRATE PO Take 400 mg by mouth daily. 1 tab daily    . omeprazole (PRILOSEC) 20 MG capsule Take 40 mg by mouth daily.     Marland Kitchen  Resveratrol 250 MG CAPS Take 250 mg by mouth daily.    . sertraline (ZOLOFT) 100 MG tablet TAKE 1 TABLET EACH DAY. 90 tablet 0  . TURMERIC PO Take 1,000 mg by mouth daily.    . Pirfenidone (ESBRIET) 267 MG TABS Take 1 tab TID for 3 weeks, then increase to 2 tabs TID and stay at this dose. 180 tablet 2  . Multiple Vitamins-Minerals (ZINC PO)     . nitroGLYCERIN (NITROSTAT) 0.4 MG SL tablet Place 1 tablet (0.4 mg total) under the tongue every 5 (five) minutes as needed for chest pain. (Patient not taking: Reported on 12/04/2020) 25 tablet 1  . apixaban (ELIQUIS) 5 MG TABS tablet Take 1 tablet (5 mg total) by mouth 2 (two) times daily for 14 days. 28 tablet 0   No facility-administered medications prior to visit.   Review of Systems  Review of Systems  Constitutional: Positive for fatigue.  HENT: Positive for postnasal drip.   Respiratory: Positive for cough and shortness of breath.   Cardiovascular: Negative.   Gastrointestinal: Negative.    Physical Exam  BP 118/70 (BP Location: Left Arm, Cuff Size: Large)   Pulse 85   Ht _0  (1.727 m)   Wt 274 lb 12.8 oz (124.6 kg)   LMP  (LMP Unknown)   SpO2 94%   BMI 41.78 kg/m  Physical Exam Constitutional:      General: She is not in acute distress.    Appearance: Normal appearance. She is obese. She is not ill-appearing.  HENT:     Head: Normocephalic and atraumatic.     Mouth/Throat:     Mouth: Mucous membranes are moist.     Pharynx: Oropharynx is clear.  Cardiovascular:     Rate and Rhythm: Normal rate and regular rhythm.     Comments: No edema Pulmonary:     Effort: Pulmonary effort is normal.     Breath sounds:  Normal breath sounds.     Comments: Diminished, faint rales Musculoskeletal:        General: Normal range of motion.     Comments: Amb with slow steady gait  Skin:    General: Skin is warm and dry.  Neurological:     General: No focal deficit present.     Mental Status: She is alert and oriented to person, place, and time. Mental status is at baseline.  Psychiatric:        Mood and Affect: Mood normal.        Behavior: Behavior normal.        Thought Content: Thought content normal.        Judgment: Judgment normal.      Lab Results:  CBC    Component Value Date/Time   WBC 8.5 07/28/2020 0913   RBC 4.93 07/28/2020 0913   HGB 14.5 07/28/2020 0913   HGB 14.1 07/21/2019 1444   HCT 43.2 07/28/2020 0913   HCT 41.9 07/21/2019 1444   PLT 260 07/28/2020 0913   PLT 275 07/21/2019 1444   MCV 87.6 07/28/2020 0913   MCV 85 07/21/2019 1444   MCH 29.4 07/28/2020 0913   MCHC 33.6 07/28/2020 0913   RDW 12.6 07/28/2020 0913   RDW 12.5 07/21/2019 1444   LYMPHSABS 2,618 07/28/2020 0913   LYMPHSABS 2.0 09/17/2017 1131   MONOABS 1.0 10/14/2018 1028   EOSABS 391 07/28/2020 0913   EOSABS 0.2 09/17/2017 1131   BASOSABS 60 07/28/2020 0913   BASOSABS 0.0 09/17/2017 1131    BMET  Component Value Date/Time   NA 141 07/28/2020 0913   NA 138 07/21/2019 1444   K 4.3 07/28/2020 0913   CL 105 07/28/2020 0913   CO2 28 07/28/2020 0913   GLUCOSE 111 (H) 07/28/2020 0913   BUN 25 07/28/2020 0913   BUN 26 07/21/2019 1444   CREATININE 1.09 (H) 07/28/2020 0913   CALCIUM 9.0 07/28/2020 0913   GFRNONAA 48 (L) 07/28/2020 0913   GFRAA 56 (L) 07/28/2020 0913    BNP No results found for: BNP  ProBNP    Component Value Date/Time   PROBNP 81.0 07/21/2019 1639    Imaging: No results found.   Assessment & Plan:   Pulmonary fibrosis ([place]), followed by Pulmonolgy - Stable interval; she is tolerating dose increase of pirfenidone without any major side effects. Currently taking 2 tabs  Esbriet three times a day. This will be her maximum dose, no plan to increase further. She is awaiting shipment of medication, verified with pharmacy that specialty pharmacy has her prescription. ILD symptom scale was 19 today which appears in line with previous. Continues oxygen on exertion, needs portable concentrator serviced. She is interested in [employer], unsure if this will be lighter for her to carry but she will let us know if she wished for [place] to order. Needs repeat liver function testing today. Provided patient with pulmonary fibrosis help center/patient support phone number. FU in 6 weeks with Dr. Chase Caller.   OSA on CPAP, compliant - She has not had ONO on CPAP yet, order was placed again and sent to corrected DME company which is [facility], NP 12/04/2020

## 2020-12-04 NOTE — Telephone Encounter (Signed)
Patient has not received shipment for Esbriet, she has 1 day left of medication. Can we look into this for her

## 2020-12-05 NOTE — Progress Notes (Signed)
LFTs normal. Continue Esbriet.

## 2020-12-05 NOTE — Telephone Encounter (Signed)
Called patient to f/u on Esbriet. She will be receiving shipment tomorrow 12/06/20 as scheduled. She has [facility] phone number saved and has been advised to call to schedule following month's shipment as soon as she received one.  Advised on general points about Esbriet including the purpose being to slow fibrosis rather than reverse.  Nothing further needed.  Knox Saliva, PharmD, MPH Clinical Pharmacist (Rheumatology and Pulmonology)

## 2020-12-07 ENCOUNTER — Other Ambulatory Visit: Payer: Self-pay | Admitting: Internal Medicine

## 2020-12-07 DIAGNOSIS — F331 Major depressive disorder, recurrent, moderate: Secondary | ICD-10-CM

## 2020-12-11 ENCOUNTER — Telehealth: Payer: Self-pay | Admitting: Cardiovascular Disease

## 2020-12-11 NOTE — Telephone Encounter (Signed)
Sure enough, had about 12 hours of atrial fibrillation yesterday, back to normal today

## 2020-12-11 NOTE — Telephone Encounter (Signed)
Returned call to pt. She report all day yesterday feeling very fatigue and off. She also report HR was all over the place and averaging between 38-97. Pt state she feels fine today but sent in a remote transmission and curious to know if she was in afib. Pt also report waking up during the night with chills and shaking.    Will route to Dr. Loletha Grayer and device clinic.

## 2020-12-11 NOTE — Telephone Encounter (Signed)
Patient c/o Palpitations:  High priority if patient c/o lightheadedness, shortness of breath, or chest pain  1) How long have you had palpitations/irregular HR/ Afib? Are you having the symptoms now? Afib, patient states she is not currently in afib.  2) Are you currently experiencing lightheadedness, SOB or CP?   3) Do you have a history of afib (atrial fibrillation) or irregular heart rhythm? Yes   4) Have you checked your BP or HR? (document readings if available):  No log available. However, she states her BP has been normal and her HR has been fluctuating in the 40's, 70's and 90's.  5) Are you experiencing any other symptoms? No

## 2020-12-11 NOTE — Telephone Encounter (Signed)
Spoke with pt.  Advised that she was in AFIB for 12 hours.  Pt stated that yesterday was awful in how she was feeling.  Advised of Dr. Victorino December advise to continue with current plan.  Recommedned to patient if she starts to have the same feeling try calling us.

## 2020-12-11 NOTE — Telephone Encounter (Signed)
Overall, the burden of atrial fibrillation remains very low (<2%) and the rate control during AFib is acceptable. No change in meds recommended at this time.

## 2020-12-11 NOTE — Telephone Encounter (Signed)
Manual transmission received.  Presenting rhythm APVS 80's.  Looks like she was in AF for almost 12 hours.

## 2020-12-14 DIAGNOSIS — R41841 Cognitive communication deficit: Secondary | ICD-10-CM | POA: Diagnosis not present

## 2020-12-18 ENCOUNTER — Telehealth (INDEPENDENT_AMBULATORY_CARE_PROVIDER_SITE_OTHER): Payer: Medicare Other | Admitting: Physician Assistant

## 2020-12-18 ENCOUNTER — Encounter: Payer: Self-pay | Admitting: Physician Assistant

## 2020-12-18 ENCOUNTER — Telehealth: Payer: Self-pay | Admitting: Pulmonary Disease

## 2020-12-18 ENCOUNTER — Encounter: Payer: Self-pay | Admitting: Pulmonary Disease

## 2020-12-18 ENCOUNTER — Telehealth: Payer: Self-pay | Admitting: Cardiovascular Disease

## 2020-12-18 VITALS — BP 148/85

## 2020-12-18 DIAGNOSIS — I480 Paroxysmal atrial fibrillation: Secondary | ICD-10-CM

## 2020-12-18 DIAGNOSIS — I10 Essential (primary) hypertension: Secondary | ICD-10-CM | POA: Diagnosis not present

## 2020-12-18 DIAGNOSIS — R7303 Prediabetes: Secondary | ICD-10-CM

## 2020-12-18 DIAGNOSIS — R5383 Other fatigue: Secondary | ICD-10-CM | POA: Diagnosis not present

## 2020-12-18 DIAGNOSIS — J8410 Pulmonary fibrosis, unspecified: Secondary | ICD-10-CM | POA: Diagnosis not present

## 2020-12-18 DIAGNOSIS — Z950 Presence of cardiac pacemaker: Secondary | ICD-10-CM

## 2020-12-18 DIAGNOSIS — R42 Dizziness and giddiness: Secondary | ICD-10-CM

## 2020-12-18 DIAGNOSIS — Z148 Genetic carrier of other disease: Secondary | ICD-10-CM

## 2020-12-18 DIAGNOSIS — Z8673 Personal history of transient ischemic attack (TIA), and cerebral infarction without residual deficits: Secondary | ICD-10-CM

## 2020-12-18 NOTE — Telephone Encounter (Signed)
Discussed with patient, VV today at 315 with Almyra Deforest PA

## 2020-12-18 NOTE — Telephone Encounter (Signed)
[name], she called pulmonologist due to be dizzy and feeling "strange" x 2 days.  Checked her BP at home and it was elevated.  Unusual for her as her BP is usually low.   She thought it was her ears, so she started meclizine last night without relief.   When she goes to sit down or lie down, dizziness gets worse.   She does not check BP regularly at home, hasn't in years.    Denies CP, blurry vision or vision changes, SOB, does not feel like she is going to pass out/lightheaded.    She spoke to pulmonology and reported BP 190/110 and 181/110, HR 70s. Advised to contact Dr. Lorenza Cambridge and go to ER for evaluation if she started having chest pain/vision changes/symptoms worsened.       Patient scheduled for virtual video visit at 3:15 with Almyra Deforest PA to further assess.    Advised patient to recheck BP and HR 10-15 mins prior to appt.     Patient also states she does still have metoprolol at home that she use to take PRN for palpitations.

## 2020-12-18 NOTE — Telephone Encounter (Signed)
This is very unusual for her - high BP has never been a problem and she does not take medications for it. Are we sure her machine is accurate? Does she still have any metoprolol at home (used to take for palpitations)? Can we get her a virtual visit today? May need to go to urgent care or ED, but I realize that between [place] and the weather, that is a hard proposition.

## 2020-12-18 NOTE — Telephone Encounter (Signed)
Pt c/o BP issue: STAT if pt c/o blurred vision, one-sided weakness or slurred speech  1. What are your last 5 BP readings? today 190/110  2. Are you having any other symptoms (ex. Dizziness, headache, blurred vision, passed out)? dizziness  3. What is your BP issue? Patient states she saw her pulmonologist and they told her to call. She states she usually deals with low BP, but now it is very high. She states the only symptoms she has is dizziness. She states she has never had dizziness like this and thought is was inner ear. She states she and does not feel like she will pass out. Please advise.

## 2020-12-18 NOTE — Progress Notes (Signed)
Virtual Visit via Telephone Note   This visit type was conducted due to national recommendations for restrictions regarding the COVID-19 Pandemic (e.g. social distancing) in an effort to limit this patient's exposure and mitigate transmission in our community.  Due to her co-morbid illnesses, this patient is at least at moderate risk for complications without adequate follow up.  This format is felt to be most appropriate for this patient at this time.  The patient did not have access to video technology/had technical difficulties with video requiring transitioning to audio format only (telephone).  All issues noted in this document were discussed and addressed.  No physical exam could be performed with this format.  Please refer to the patient's chart for her  consent to telehealth for [facility].    Date:  12/19/2020   ID:  [name] [name], DOB May 16, 1941, MRN [id number] The patient was identified using 2 identifiers.  Patient Location: Home Provider Location: Home Office  PCP:  [name], MD  Cardiologist:  Sanda Klein, MD  Electrophysiologist:  None   Evaluation Performed:  Follow-Up Visit  Chief Complaint:  Follow up  History of Present Illness:    [name] [name] is a 80 y.o. 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 Myoview in January 2018 was low risk. Echocardiogram obtained on 05/18/2019 showed EF 50 to 66%, diastolic function could not be evaluated, otherwise no significant valve issue.  Recent device interrogation continues to show low A. fib burden of less than 2%.  Patient had significant dizziness since the night before yesterday.  The symptom is accompanied by  headache as well.  Symptom occurs when she is trying to lay down or when she is trying to set up.  She checked her blood pressure this morning and it was quite elevated up to the 440H systolic.  After taking a dose of Valium, her blood pressure finally came down to 140s.  I took a look at her previous visits with her pulmonologist recently, blood pressure were all normal during the last 2 office visit.  Her blood pressure tends to run low normally.  I am hesitant to add on a blood pressure medication permanently.  She has some old metoprolol 25 mg tablet, I instructed her to take metoprolol on a as needed basis for systolic blood pressure of over 160.  Otherwise she denies any obvious chest pain or ipsilateral weakness.  I recommended 2-week follow-up.  She will bring all of her home medication to her next visit.  I also recommend the EKG and orthostatic vital signs on the next visit.  She will bring with her a blood pressure diary with 2 recordings per day.   The patient does not have symptoms concerning for COVID-19 infection (fever, chills, cough, or new shortness of breath).    Past Medical History:  Diagnosis Date  . Alpha-1-antitrypsin deficiency carrier   . Arthritis    "all over"  . Atherosclerosis of aorta ([place])   . Atrial fibrillation ([place])   . Back pain   . Complication of anesthesia    "I woke up during 2 different procedures" (10/04/2014)  . Diastolic dysfunction   . Frequent UTI   . GERD (gastroesophageal reflux disease)   . Hypertension   . Hypertriglyceridemia   .  Insulin resistance   . Long term current use of anticoagulant   . Multiple lung nodules   . Muscular deconditioning   . MVP (mitral valve prolapse)   . Obesity   . OSA on CPAP   . Osteoarthritis   . Osteopenia    unspecified location  . Pacemaker   . PAF (paroxysmal atrial fibrillation) ([place])   . Positive ANA (antinuclear antibody)   . Presbyesophagus   . Presence of permanent cardiac pacemaker   . Primary  osteoarthritis of both hands    neck and spine and hips  . Pulmonary fibrosis ([place])    followed by pulmonolgy  . PVC's (premature ventricular contractions)   . Shortness of breath   . 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 Sleep apnea   . Stroke [facility]) 01/2014   denies deficits on 10/04/2014  . Tachy-brady syndrome ([place]) 10/2014  . TIA (transient ischemic attack)   . Vitamin D deficiency    Past Surgical History:  Procedure Laterality Date  . CARDIOVERSION N/A 09/02/2017   Procedure: CARDIOVERSION;  Surgeon: Sanda Klein, MD;  Location: [facility];  Service: Cardiovascular;  Laterality: N/A;  . COLONOSCOPY  2019  . ESOPHAGEAL DILATION    . EXCISION VAGINAL CYST     benign nodules  . INSERT / REPLACE / REMOVE PACEMAKER  10/04/2014   Medtronic Advisa model [id number] serial number [id number]  . LOOP RECORDER EXPLANT N/A 10/04/2014   Procedure: LOOP RECORDER EXPLANT;  Surgeon: Sanda Klein, MD;  Location: [facility];  Service: Cardiovascular;  Laterality: N/A;  . LOOP RECORDER IMPLANT N/A 04/19/2014   Procedure: LOOP RECORDER IMPLANT;  Surgeon: Sanda Klein, MD;  Location: [facility];  Service: Cardiovascular;  Laterality: N/A;  . NM MYOCAR PERF WALL MOTION  12/18/2007   normal  . PERMANENT PACEMAKER INSERTION N/A 10/04/2014   Procedure: PERMANENT PACEMAKER INSERTION;  Surgeon: Sanda Klein, MD;  Location: [facility];  Service: Cardiovascular;  Laterality: N/A;  . TUBAL LIGATION    . US ECHOCARDIOGRAPHY  05/15/2010   LA mildly dilated,mild mitral annular ca+, AOV mildly sclerotic, mild asymmetric LVH     Current Meds  Medication Sig  . apixaban (ELIQUIS) 5 MG TABS tablet Take 1 tablet (5 mg total) by mouth 2 (two) times daily.  . B Complex-C (SUPER B COMPLEX PO) Take 1 tablet by mouth daily.  . Biotin 5000 MCG CAPS Take 5,000 mcg by mouth daily.   . Calcium Carbonate-Vit D-Min (CALCIUM 1200 PO) Take 1,200 mg by mouth daily.   .  cholecalciferol (VITAMIN D) 1000 UNITS tablet Take 6,000 Units by mouth daily.   Marland Kitchen Co-Enzyme Q10 100 MG CAPS Take 100 mg by mouth daily.   . diazepam (VALIUM) 5 MG tablet TAKE 1 TABLET EACH DAY. (Patient taking differently: Take 5 mg by mouth daily as needed for anxiety. TAKE 1 TABLET EACH DAY.)  . fluticasone (FLONASE) 50 MCG/ACT nasal spray Place 2 sprays into both nostrils as needed for allergies or rhinitis.  . folic acid (FOLVITE) 347 MCG tablet Take 400 mcg by mouth daily.  Marland Kitchen glucosamine-chondroitin 500-400 MG tablet Take 1 tablet by mouth 2 (two) times daily.   Marland Kitchen loratadine (CLARITIN) 10 MG tablet Take 10 mg by mouth daily.  Marland Kitchen MAGNESIUM CITRATE PO Take 400 mg by mouth daily. 1 tab daily  . Multiple Vitamins-Minerals (ZINC PO)   . nitroGLYCERIN (NITROSTAT) 0.4 MG SL tablet Place 1 tablet (0.4 mg  total) under the tongue every 5 (five) minutes as needed for chest pain.  . Pirfenidone (ESBRIET) 267 MG TABS Take 2 tablets (534 mg total) by mouth with breakfast, with lunch, and with evening meal.  . Resveratrol 250 MG CAPS Take 250 mg by mouth daily.  . sertraline (ZOLOFT) 100 MG tablet TAKE 1 TABLET EACH DAY.  Marland Kitchen TURMERIC PO Take 1,000 mg by mouth daily.  . [DISCONTINUED] omeprazole (PRILOSEC) 20 MG capsule Take 40 mg by mouth daily.      Allergies:   Ancef [cefazolin], Pseudoephedrine hcl, Ergotamine, Pentobarbital, and Caffeine   Social History   Tobacco Use  . Smoking status: Former Smoker    Packs/day: 2.00    Years: 18.00    Pack years: 36.00    Types: Cigarettes    Quit date: 07/18/1970    Years since quitting: 50.4  . Smokeless tobacco: Never Used  . Tobacco comment: "quit smoking in 1971"  Vaping Use  . Vaping Use: Never used  Substance Use Topics  . Alcohol use: Yes    Alcohol/week: 0.0 standard drinks    Comment: 10/04/2014 "might have a drink a couple times/yr"  . Drug use: No     Family Hx: The patient's family history includes Alcoholism in her brother; Alpha-1  antitrypsin deficiency in her daughter; Arthritis in her son; Breast cancer in her daughter; Diabetes in her son; Healthy in her brother, daughter, and sister; Heart attack in her mother; Heart failure in her father; Prostate cancer in her brother, brother, and brother; Stroke in her brother, brother, and father; Sudden death in her mother.  ROS:   Please see the history of present illness.     All other systems reviewed and are negative.   Prior CV studies:   The following studies were reviewed today:  Echo 05/18/2019 1. The left ventricle has low normal systolic function, with an ejection  fraction of 50-55%. The cavity size was normal. Left ventricular diastolic  function could not be evaluated.  2. The right ventricle has normal systolic function. The cavity was  normal.  3. The mitral valve is grossly normal.  4. The tricuspid valve is grossly normal.  5. The aortic valve is tricuspid. Mild thickening of the aortic valve. No  stenosis of the aortic valve.  6. Normal LV systolic function; no significant valvular heart disease.    Labs/Other Tests and Data Reviewed:    EKG:  An ECG dated 07/14/2019 was personally reviewed today and demonstrated:  Normal sinus rhythm without significant ST-T wave changes.  Recent Labs: 07/28/2020: BUN 25; Creat 1.09; Hemoglobin 14.5; Platelets 260; Potassium 4.3; Sodium 141 12/04/2020: ALT 13   Recent Lipid Panel Lab Results  Component Value Date/Time   CHOL 195 07/28/2020 09:13 AM   CHOL 176 06/03/2018 10:22 AM   TRIG 133 07/28/2020 09:13 AM   HDL 55 07/28/2020 09:13 AM   HDL 53 06/03/2018 10:22 AM   CHOLHDL 3.5 07/28/2020 09:13 AM   LDLCALC 115 (H) 07/28/2020 09:13 AM   LDLDIRECT 81.0 07/15/2016 02:19 PM    Wt Readings from Last 3 Encounters:  12/04/20 274 lb 12.8 oz (124.6 kg)  11/09/20 274 lb 6.4 oz (124.5 kg)  10/25/20 271 lb 12.8 oz (123.3 kg)     Risk Assessment/Calculations:     CHA2DS2-VASc Score = 7  This indicates a  11.2% annual risk of stroke. The patient's score is based upon: CHF History: No HTN History: Yes Diabetes History: No Stroke History: Yes Vascular  Disease History: Yes Age Score: 2 Gender Score: 1     Objective:    Vital Signs:  BP (!) 148/85   LMP  (LMP Unknown)    VITAL SIGNS:  reviewed  ASSESSMENT & PLAN:    1. Fatigue: Obtain CBC, basic metabolic panel and a TSH.  2. Dizziness: Symptom has been going on for 2 days.  This occurs both when she tried to lay down and also when she tried to stand up.  She will need a EKG and orthostatic vital signs on the next follow-up.   3. PAF: Recent interrogation shows very low A. fib burden of less than 2%.  Continue Eliquis  4. Hypertension: Blood pressure was quite elevated this morning, this is likely contributing to her dizziness and headache.  She took a dose of Valium and her blood pressure came down to 140s from the 190s.  I am hesitant to permanently add on blood pressure medication as she has a history of low blood pressure and her blood pressure during the previous office visit has always been normal or low.  She has some old metoprolol tartrate 25 mg tablet, she may take metoprolol as needed basis for systolic blood pressure of greater than 160.  5. Prediabetes: Followed by primary care provider  6. Pulmonary fibrosis: Followed by pulmonology service.  Recently started on Esbriet, although this medication may also cause dizziness and headache, however I wish to rule out other potential causes before consider medication side effect as this medication likely will offer her more benefit than harm.  7. Alpha 1 antitrypsin deficiency carrier: Her daughter who has passed away was homozygous for the alpha-1 antitrypsin deficiency.  8. History of pacemaker: Recent interrogation showed stable device function, low A. fib burden  9. History of CVA: Despite the dizziness, she denies any ipsilateral weakness.  Carotid Doppler obtained in 2017  showed minimal disease 1 to 39% bilaterally.        COVID-19 Education: The signs and symptoms of COVID-19 were discussed with the patient and how to seek care for testing (follow up with PCP or arrange E-visit).  The importance of social distancing was discussed today.  Time:   Today, I have spent 15 minutes with the patient with telehealth technology discussing the above problems.     Medication Adjustments/Labs and Tests Ordered: Current medicines are reviewed at length with the patient today.  Concerns regarding medicines are outlined above.   Tests Ordered: Orders Placed This Encounter  Procedures  . Basic metabolic panel  . TSH  . CBC    Medication Changes: No orders of the defined types were placed in this encounter.   Follow Up:  In Person in 2 week(s)  Signed, Almyra Deforest, [place]  12/19/2020 10:06 PM    [facility]

## 2020-12-18 NOTE — Patient Instructions (Addendum)
Medication Instructions:  Your physician recommends that you continue on your current medications as directed. Please refer to the Current Medication list given to you today.  *If you need a refill on your cardiac medications before your next appointment, please call your pharmacy*  Lab Work: Your physician recommends that you return for lab work in 1-2 weeks:   BMET  CBC  TSH If you have labs (blood work) drawn today and your tests are completely normal, you will receive your results only by: Marland Kitchen MyChart Message (if you have MyChart) OR . A paper copy in the mail If you have any lab test that is abnormal or we need to change your treatment, we will call you to review the results.  Testing/Procedures: NONE ordered at this time of appointment   Follow-Up: At [facility], you and your health needs are our priority.  As part of our continuing mission to provide you with exceptional heart care, we have created designated Provider Care Teams.  These Care Teams include your primary Cardiologist (physician) and Advanced Practice Providers (APPs -  Physician Assistants and Nurse Practitioners) who all work together to provide you with the care you need, when you need it.   Your next appointment:   Follow up as scheduled-01/08/21  The format for your next appointment:   In Person  Provider:   Sanda Klein, MD   Other Instructions  MONITOR blood pressure 2 times a day. 1st blood pressure should be taken 2 hours after morning medications. 2nd blood pressure should be taken at a time of your choice in the afternoon/evening at the same time daily.

## 2020-12-18 NOTE — Telephone Encounter (Signed)
12/18/20  79 year old female former smoker followed in our office for IPF.  Established with Dr. Chase Caller.  Last seen by EW NP on 12/04/2020.  Patient contacted our office triage line reporting that she has had 1 day of increased dizziness and potential vertigo.  She reports that in the past she has needed meclizine for this.  But she feels that this is worse.  Patient was requested to obtain vital signs at home.  Those are listed below:  BP: 190/110, 181 /110 HR: 70s O2:  93 percent (on RA) Temp: unable   No active chest pain.  Patient denies any vision changes.  Patient denies any numbness/tingling.  Plan: Recommend the patient contact cardiology team immediately  (Dr. Sallyanne Kuster) Patient aware to contact 911 if she starts having chest pain or vision changes Patient aware to seek emergent evaluation if any symptoms worsen Patient reports that she will contact cardiology immediately have CCed them on this message  Also route message to EW NP and Dr. Chase Caller today patient in office

## 2020-12-19 DIAGNOSIS — R41841 Cognitive communication deficit: Secondary | ICD-10-CM | POA: Diagnosis not present

## 2020-12-19 NOTE — Telephone Encounter (Signed)
Unsure of validity as patient called in and took this herself and this was a triage call. Thanks for coordinating follow up.   Wyn Quaker FNP

## 2020-12-22 DIAGNOSIS — R41841 Cognitive communication deficit: Secondary | ICD-10-CM | POA: Diagnosis not present

## 2020-12-27 ENCOUNTER — Telehealth: Payer: Self-pay | Admitting: Internal Medicine

## 2020-12-27 MED ORDER — PREDNISONE 10 MG PO TABS: ORAL_TABLET | ORAL | 0 refills | Status: DC

## 2020-12-27 MED ORDER — DOXYCYCLINE HYCLATE 100 MG PO TABS: 100.0000 mg | ORAL_TABLET | Freq: Two times a day (BID) | ORAL | 0 refills | Status: DC

## 2020-12-27 NOTE — Telephone Encounter (Signed)
Primary Pulmonologist: MR Last office visit and with whom: 12/04/20 with Beth What do we see them for (pulmonary problems): IPF Last OV assessment/plan: Patient support/resources  Pulmonary fibrosis help center [phone]  Local number for patient support [phone]   Recommendations: - I sent a message to our pharmacy team about Esbriet shipment, would recommend you take 1 tablet three times a day until new shipment arrives then increase back to 2 tablets three times a day (SET an ALARM to remind you to take afternoon dose) - You need to call [facility] to have them service or replace portable oxygen concentrator that is not working- their number is [phone]  - ONO order was sent to [facility] and not [facility], we will re-send order   Orders: - Liver function testing today - Best fit oxygen with [facility]  - ONO on CPAP with [facility] (patient phone number [phone])  Follow-up: 6 weeks with Dr. Chase Caller ILD clinic- 30 mins  Was appointment offered to patient (explain)?  Patient wanted recommendations first.    Reason for call: Spoke with patient. She stated that she believes she has a cold. She has a productive cough with clear phlegm (this is unchanged from her baseline), chills and fatigue. She denied any fevers or body aches. Slight increase in SOB.  She stated that her brother came to visit her last week and a lot of people were in and out of her house visiting. She has not obtain covid testing yet. I did advise her that it may be a good idea to get a test just to be on the safe side with her diagnosis. She still wanted to see what MR would recommend for her.    Allergies  Allergen Reactions  . Ancef [Cefazolin] Hives and Itching  . Pseudoephedrine Hcl     Other reaction(s): palpitations (moderate)  . Ergotamine   . Pentobarbital   . Caffeine Palpitations    Immunization History  Administered Date(s) Administered  . Influenza Split 09/13/2020  . Influenza Whole  09/23/2008  . Influenza, High Dose Seasonal PF 09/13/2018, 09/27/2019, 09/26/2020  . Influenza,inj,quad, With Preservative 09/04/2017  . Influenza-Unspecified 09/17/2014, 09/09/2016, 09/04/2017  . Moderna Sars-Covid-2 Vaccination 12/06/2019, 01/03/2020, 10/16/2020  . Pneumococcal Conjugate-13 09/29/2014, 10/16/2015  . Pneumococcal Polysaccharide-23 04/06/2009, 10/02/2017  . Pneumococcal-Unspecified 10/01/2014, 10/02/2017  . Td 04/05/1999  . Tdap 04/06/2009  . Zoster 12/02/2008, 09/29/2018, 01/19/2019  . Zoster Recombinat (Shingrix) 09/29/2018, 01/19/2019   Pharmacy is [facility].   MR, can you please advise? Thanks!

## 2020-12-27 NOTE — Telephone Encounter (Signed)
5d prednisone side effets are rise in sugar, irrtiability, sleep issues an dhunger and bp rise typically but these are transient, and less common and improve. She can watch but overall safe to take with her med issues.   Can monitor bp/hr/pulse ox and how she feelks

## 2020-12-27 NOTE — Telephone Encounter (Signed)
   Needs to get repeat covid test  Due to ILD  Please take prednisone 40 mg x1 day, then 30 mg x1 day, then 20 mg x1 day, then 10 mg x1 day, and then 5 mg x1 day and stop  Take doxycycline 121m po twice daily x 5 days; take after meals and avoid sunlight    Allergies  Allergen Reactions  . Ancef [Cefazolin] Hives and Itching  . Pseudoephedrine Hcl     Other reaction(s): palpitations (moderate)  . Ergotamine   . Pentobarbital   . Caffeine Palpitations

## 2020-12-27 NOTE — Telephone Encounter (Signed)
Spoke with patient, she is aware of MR's recs. Will go ahead and call prednisone and doxy.   Nothing further needed at time of call.

## 2020-12-27 NOTE — Telephone Encounter (Signed)
Called and spoke with patient. I was able to get her scheduled for a covid test tomorrow at 615pm at [facility].   She wanted to make sure she is safe to take prednisone. Her has a history of Afib, TIA and PVCs. She has taken prednisone before but it has been awhile.   MR, can you please advise? Thanks!

## 2020-12-28 ENCOUNTER — Other Ambulatory Visit: Payer: Medicare Other

## 2020-12-28 DIAGNOSIS — Z20822 Contact with and (suspected) exposure to covid-19: Secondary | ICD-10-CM

## 2020-12-29 LAB — NOVEL CORONAVIRUS, NAA: SARS-CoV-2, NAA: DETECTED — AB

## 2020-12-29 LAB — SARS-COV-2, NAA 2 DAY TAT

## 2020-12-30 ENCOUNTER — Telehealth (INDEPENDENT_AMBULATORY_CARE_PROVIDER_SITE_OTHER): Payer: Medicare Other | Admitting: Internal Medicine

## 2020-12-30 ENCOUNTER — Other Ambulatory Visit (HOSPITAL_COMMUNITY): Payer: Self-pay | Admitting: Family

## 2020-12-30 DIAGNOSIS — U071 COVID-19: Secondary | ICD-10-CM

## 2020-12-30 NOTE — Progress Notes (Signed)
I connected by phone with [name] [name] on 12/30/2020 at 8:24 PM to discuss the potential use of the antiviral REMDESIVIR for acute COVID-19 viral infection in non-hospitalized patients.   This patient is a 80 y.o. female that meets the FDA criteria for Emergency Use Authorization of [place].  Has a (+) direct SARS-CoV-2 viral test result  Has mild or moderate symptoms related to COVID-19  Is within 7 days of symptom onset  Has at least one of the high risk factor(s) for progression to severe COVID-19 and/or hospitalization as defined in NIH Guidelines and EUA.   Specific high risk criteria : Older age (>/= 81 yo), Cardiovascular disease or hypertension and Chronic Lung Disease   Symptoms of cough and sore throat began 12/26/2020.   She states she is fully vaccinated with booster.   I have spoken and communicated the following to the patient or parent/caregiver regarding COVID-19 IV Antiviral treatment:  1. FDA has authorized the emergency use for the treatment of mild to moderate COVID-19 in adults and pediatric patients with positive results of SARS-CoV-2 testing who are 35 years of age and older weighing at least 40 kg, and who are at high risk for progressing to severe COVID-19 and/or hospitalization.  2. The significant known and potential risks and benefits in receiving REMDESIVIR in accordance with current NIH treatment guidelines.   3. Information on available alternative treatments and the risks and benefits of those alternatives, including clinical trials that may be accessible to the patient.   4. Patients treated with antiviral therapy should continue to isolate and use infection control measures (e.g., wear mask, isolate, social distance, avoid sharing personal items, clean and disinfect "high touch" surfaces, and frequent handwashing) according to CDC guidelines.   5. The patient or parent/caregiver has the option to accept or refuse REMDESIVIR therapy and has had the  opportunity to have all questions addressed prior to consenting for treatment.    After reviewing this information with the patient, he/she has decided to proceed with the 3 day course of treatment.   Asencion Gowda, NP 12/30/2020  8:24 PM

## 2020-12-30 NOTE — Telephone Encounter (Signed)
Type of visit: Telephone/Video Circumstance: COVID-19 national emergency Identification of patient [name] [name] with 12/17/1940 and MRN [id number] - 2 person identifier Risks: Risks, benefits, limitations of telephone visit explained. Patient understood and verbalized agreement to proceed Anyone else on call: no Patient location: home This provider location: [place]    Full vaccinated female [name] [name] 06/01/1941  -> she called via answering service and said on my CHART last night and noticed results from 12/28/20 are covid positive. AS of now - sore throast x 5 days and other symptoms of sinus congestion and cough. Today she is feels bettter. Pulse ox now 94% RA  Plan   - advised rest  And fluids   - complete recent pred precrioptopn  - prob no role for anti virals but will refer to covid clinic - isolate 5-7 days from 12/28/20 - referral made to viral clinic    has a past medical history of Alpha-1-antitrypsin deficiency carrier, Arthritis, Atherosclerosis of aorta ([place]), Atrial fibrillation ([place]), Back pain, Complication of anesthesia, Diastolic dysfunction, Frequent UTI, GERD (gastroesophageal reflux disease), Hypertension, Hypertriglyceridemia, Insulin resistance, Long term current use of anticoagulant, Multiple lung nodules, Muscular deconditioning, MVP (mitral valve prolapse), Obesity, OSA on CPAP, Osteoarthritis, Osteopenia, Pacemaker, PAF (paroxysmal atrial fibrillation) ([place]), Positive ANA (antinuclear antibody), Presbyesophagus, Presence of permanent cardiac pacemaker, Primary osteoarthritis of both hands, Pulmonary fibrosis ([place]), PVC's (premature ventricular contractions), Shortness of breath, Sinus arrest (10/2014), Sleep apnea, Stroke ([place]) (01/2014), Tachy-brady syndrome ([place]) (10/2014), TIA (transient ischemic attack), and Vitamin D deficiency.   Immunization History  Administered Date(s) Administered  . Influenza Split 09/13/2020  . Influenza Whole 09/23/2008  .  Influenza, High Dose Seasonal PF 09/13/2018, 09/27/2019, 09/26/2020  . Influenza,inj,quad, With Preservative 09/04/2017  . Influenza-Unspecified 09/17/2014, 09/09/2016, 09/04/2017  . Moderna Sars-Covid-2 Vaccination 12/06/2019, 01/03/2020, 10/16/2020  . Pneumococcal Conjugate-13 09/29/2014, 10/16/2015  . Pneumococcal Polysaccharide-23 04/06/2009, 10/02/2017  . Pneumococcal-Unspecified 10/01/2014, 10/02/2017  . Td 04/05/1999  . Tdap 04/06/2009  . Zoster 12/02/2008, 09/29/2018, 01/19/2019  . Zoster Recombinat (Shingrix) 09/29/2018, 01/19/2019     Allergies  Allergen Reactions  . Ancef [Cefazolin] Hives and Itching  . Pseudoephedrine Hcl     Other reaction(s): palpitations (moderate)  . Ergotamine   . Pentobarbital   . Caffeine Palpitations      Current Outpatient Medications:  .  apixaban (ELIQUIS) 5 MG TABS tablet, Take 1 tablet (5 mg total) by mouth 2 (two) times daily., Disp: 180 tablet, Rfl: 3 .  B Complex-C (SUPER B COMPLEX PO), Take 1 tablet by mouth daily., Disp: , Rfl:  .  Biotin 5000 MCG CAPS, Take 5,000 mcg by mouth daily. , Disp: , Rfl:  .  Calcium Carbonate-Vit D-Min (CALCIUM 1200 PO), Take 1,200 mg by mouth daily. , Disp: , Rfl:  .  cholecalciferol (VITAMIN D) 1000 UNITS tablet, Take 6,000 Units by mouth daily. , Disp: , Rfl:  .  Co-Enzyme Q10 100 MG CAPS, Take 100 mg by mouth daily. , Disp: , Rfl:  .  diazepam (VALIUM) 5 MG tablet, TAKE 1 TABLET EACH DAY. (Patient taking differently: Take 5 mg by mouth daily as needed for anxiety. TAKE 1 TABLET EACH DAY.), Disp: 30 tablet, Rfl: 0 .  doxycycline (VIBRA-TABS) 100 MG tablet, Take 1 tablet (100 mg total) by mouth 2 (two) times daily., Disp: 14 tablet, Rfl: 0 .  fluticasone (FLONASE) 50 MCG/ACT nasal spray, Place 2 sprays into both nostrils as needed for allergies or rhinitis., Disp: , Rfl:  .  folic acid (  FOLVITE) 400 MCG tablet, Take 400 mcg by mouth daily., Disp: , Rfl:  .  glucosamine-chondroitin 500-400 MG tablet,  Take 1 tablet by mouth 2 (two) times daily. , Disp: , Rfl:  .  loratadine (CLARITIN) 10 MG tablet, Take 10 mg by mouth daily., Disp: , Rfl:  .  MAGNESIUM CITRATE PO, Take 400 mg by mouth daily. 1 tab daily, Disp: , Rfl:  .  Multiple Vitamins-Minerals (ZINC PO), , Disp: , Rfl:  .  nitroGLYCERIN (NITROSTAT) 0.4 MG SL tablet, Place 1 tablet (0.4 mg total) under the tongue every 5 (five) minutes as needed for chest pain., Disp: 25 tablet, Rfl: 1 .  Pirfenidone (ESBRIET) 267 MG TABS, Take 2 tablets (534 mg total) by mouth with breakfast, with lunch, and with evening meal., Disp: 180 tablet, Rfl: 11 .  predniSONE (DELTASONE) 10 MG tablet, Take 4 tabs x1 day, 3 tabs x1 day, 2 tabs x1 day, 1 tab x1 day and then 1/2 tab x1 day., Disp: 11 tablet, Rfl: 0 .  Resveratrol 250 MG CAPS, Take 250 mg by mouth daily., Disp: , Rfl:  .  sertraline (ZOLOFT) 100 MG tablet, TAKE 1 TABLET EACH DAY., Disp: 90 tablet, Rfl: 0 .  TURMERIC PO, Take 1,000 mg by mouth daily., Disp: , Rfl:     (Telephone visit - Level 02 visit: Estb 11-20 for this visit type which was visit type: telephone visit in total care time and counseling or/and coordination of care by this undersigned MD - Dr Brand Males. This includes one or more of the following for care delivered on 12/30/2020 same day: pre-charting, chart review, note writing, documentation discussion of test results, diagnostic or treatment recommendations, prognosis, risks and benefits of management options, instructions, education, compliance or risk-factor reduction. It excludes time spent by the [facility] or office staff in the care of the patient. Actual time was 15 min. E&M code is [phone])

## 2021-01-01 ENCOUNTER — Ambulatory Visit (HOSPITAL_COMMUNITY)
Admission: RE | Admit: 2021-01-01 | Discharge: 2021-01-01 | Disposition: A | Discharge status: Home / Self Care | Payer: Medicare Other | Source: Ambulatory Visit | Attending: Pulmonary Disease | Admitting: Pulmonary Disease

## 2021-01-01 DIAGNOSIS — U071 COVID-19: Secondary | ICD-10-CM | POA: Diagnosis not present

## 2021-01-01 MED ORDER — ALBUTEROL SULFATE HFA 108 (90 BASE) MCG/ACT IN AERS: 2.0000 | INHALATION_SPRAY | Freq: Once | RESPIRATORY_TRACT | Status: DC | PRN

## 2021-01-01 MED ORDER — EPINEPHRINE 0.3 MG/0.3ML IJ SOAJ: 0.3000 mg | Freq: Once | INTRAMUSCULAR | Status: DC | PRN

## 2021-01-01 MED ORDER — DIPHENHYDRAMINE HCL 50 MG/ML IJ SOLN: 50.0000 mg | Freq: Once | INTRAMUSCULAR | Status: DC | PRN

## 2021-01-01 MED ORDER — SODIUM CHLORIDE 0.9 % IV SOLN: INTRAVENOUS | Status: DC | PRN

## 2021-01-01 MED ORDER — FAMOTIDINE IN NACL 20-0.9 MG/50ML-% IV SOLN: 20.0000 mg | Freq: Once | INTRAVENOUS | Status: DC | PRN

## 2021-01-01 MED ORDER — SODIUM CHLORIDE 0.9 % IV SOLN
100.0000 mg | Freq: Once | INTRAVENOUS | Status: AC
  Administered 2021-01-01: 100 mg via INTRAVENOUS

## 2021-01-01 MED ORDER — METHYLPREDNISOLONE SODIUM SUCC 125 MG IJ SOLR: 125.0000 mg | Freq: Once | INTRAMUSCULAR | Status: DC | PRN

## 2021-01-01 NOTE — Discharge Instructions (Signed)
10 Things You Can Do to Manage Your COVID-19 Symptoms at Home If you have possible or confirmed COVID-19: 1. Stay home except to get medical care. 2. Monitor your symptoms carefully. If your symptoms get worse, call your healthcare provider immediately. 3. Get rest and stay hydrated. 4. If you have a medical appointment, call the healthcare provider ahead of time and tell them that you have or may have COVID-19. 5. For medical emergencies, call 911 and notify the dispatch personnel that you have or may have COVID-19. 6. Cover your cough and sneezes with a tissue or use the inside of your elbow. 7. Wash your hands often with soap and water for at least 20 seconds or clean your hands with an alcohol-based hand sanitizer that contains at least 60% alcohol. 8. As much as possible, stay in a specific room and away from other people in your home. Also, you should use a separate bathroom, if available. If you need to be around other people in or outside of the home, wear a mask. 9. Avoid sharing personal items with other people in your household, like dishes, towels, and bedding. 10. Clean all surfaces that are touched often, like counters, tabletops, and doorknobs. Use household cleaning sprays or wipes according to the label instructions. [email or web address] 06/16/2020 This information is not intended to replace advice given to you by your health care provider. Make sure you discuss any questions you have with your health care provider. Document Revised: 10/02/2020 Document Reviewed: 10/02/2020 Elsevier Patient Education  2021 [place]. If you have any questions or concerns after the infusion please call the Advanced Practice Provider on call at [phone]. This number is ONLY intended for your use regarding questions or concerns about the infusion post-treatment side-effects.  Please do not provide this number to others for use. For return to work notes please contact your primary care provider.    If someone you know is interested in receiving treatment please have them contact their MD for a referral or visit [email or web address]

## 2021-01-01 NOTE — Progress Notes (Signed)
  Diagnosis: COVID-19  Physician: Dr. Joya Gaskins   Procedure: [facility] Med: remdesivir infusion - Provided patient with remdesivir fact sheet for patients, parents and caregivers prior to infusion.  Complications: No immediate complications noted.  Discharge: Discharged home   [name] [name] 01/01/2021

## 2021-01-02 ENCOUNTER — Ambulatory Visit (HOSPITAL_COMMUNITY)
Admission: RE | Admit: 2021-01-02 | Discharge: 2021-01-02 | Disposition: A | Discharge status: Home / Self Care | Payer: Medicare Other | Source: Ambulatory Visit | Attending: Pulmonary Disease | Admitting: Pulmonary Disease

## 2021-01-02 DIAGNOSIS — J1282 Pneumonia due to coronavirus disease 2019: Secondary | ICD-10-CM | POA: Insufficient documentation

## 2021-01-02 DIAGNOSIS — U071 COVID-19: Secondary | ICD-10-CM | POA: Insufficient documentation

## 2021-01-02 MED ORDER — FAMOTIDINE IN NACL 20-0.9 MG/50ML-% IV SOLN: 20.0000 mg | Freq: Once | INTRAVENOUS | Status: DC | PRN

## 2021-01-02 MED ORDER — SODIUM CHLORIDE 0.9 % IV SOLN
100.0000 mg | Freq: Once | INTRAVENOUS | Status: AC
  Administered 2021-01-02: 100 mg via INTRAVENOUS

## 2021-01-02 MED ORDER — EPINEPHRINE 0.3 MG/0.3ML IJ SOAJ: 0.3000 mg | Freq: Once | INTRAMUSCULAR | Status: DC | PRN

## 2021-01-02 MED ORDER — METHYLPREDNISOLONE SODIUM SUCC 125 MG IJ SOLR: 125.0000 mg | Freq: Once | INTRAMUSCULAR | Status: DC | PRN

## 2021-01-02 MED ORDER — ALBUTEROL SULFATE HFA 108 (90 BASE) MCG/ACT IN AERS: 2.0000 | INHALATION_SPRAY | Freq: Once | RESPIRATORY_TRACT | Status: DC | PRN

## 2021-01-02 MED ORDER — SODIUM CHLORIDE 0.9 % IV SOLN: INTRAVENOUS | Status: DC | PRN

## 2021-01-02 MED ORDER — DIPHENHYDRAMINE HCL 50 MG/ML IJ SOLN: 50.0000 mg | Freq: Once | INTRAMUSCULAR | Status: DC | PRN

## 2021-01-02 NOTE — Discharge Instructions (Signed)
10 Things You Can Do to Manage Your COVID-19 Symptoms at Home °If you have possible or confirmed COVID-19: °1. Stay home except to get medical care. °2. Monitor your symptoms carefully. If your symptoms get worse, call your healthcare provider immediately. °3. Get rest and stay hydrated. °4. If you have a medical appointment, call the healthcare provider ahead of time and tell them that you have or may have COVID-19. °5. For medical emergencies, call 911 and notify the dispatch personnel that you have or may have COVID-19. °6. Cover your cough and sneezes with a tissue or use the inside of your elbow. °7. Wash your hands often with soap and water for at least 20 seconds or clean your hands with an alcohol-based hand sanitizer that contains at least 60% alcohol. °8. As much as possible, stay in a specific room and away from other people in your home. Also, you should use a separate bathroom, if available. If you need to be around other people in or outside of the home, wear a mask. °9. Avoid sharing personal items with other people in your household, like dishes, towels, and bedding. °10. Clean all surfaces that are touched often, like counters, tabletops, and doorknobs. Use household cleaning sprays or wipes according to the label instructions. °cdc.gov/coronavirus °06/16/2020 °This information is not intended to replace advice given to you by your health care provider. Make sure you discuss any questions you have with your health care provider. °Document Revised: 10/02/2020 Document Reviewed: 10/02/2020 °Elsevier Patient Education © 2021 Elsevier Inc. °If you have any questions or concerns after the infusion please call the Advanced Practice Provider on call at [phone]. This number is ONLY intended for your use regarding questions or concerns about the infusion post-treatment side-effects.  Please do not provide this number to others for use. For return to work notes please contact your primary care provider.   ° °If someone you know is interested in receiving treatment please have them contact their MD for a referral or visit [email or web address] ° ° ° °

## 2021-01-02 NOTE — Progress Notes (Signed)
  Diagnosis: COVID-19  Physician: Dr. Joya Gaskins   Procedure: [facility] Infusion Clinic Med: remdesivir infusion - Provided patient with remdesivir fact sheet for patients, parents and caregivers prior to infusion.  Complications: No immediate complications noted.  Discharge: Discharged home   [name] [name] 01/02/2021

## 2021-01-02 NOTE — Progress Notes (Signed)
Patient reviewed Fact Sheet for Patients, Parents, and Caregivers for Emergency Use Authorization (EUA) of sotrovimab for the Treatment of Coronavirus. Patient also reviewed and is agreeable to the estimated cost of treatment. Patient is agreeable to proceed.

## 2021-01-03 ENCOUNTER — Ambulatory Visit (HOSPITAL_COMMUNITY)
Admission: RE | Admit: 2021-01-03 | Discharge: 2021-01-03 | Disposition: A | Discharge status: Home / Self Care | Payer: Medicare Other | Source: Ambulatory Visit | Attending: Pulmonary Disease | Admitting: Pulmonary Disease

## 2021-01-03 DIAGNOSIS — J1282 Pneumonia due to coronavirus disease 2019: Secondary | ICD-10-CM | POA: Diagnosis not present

## 2021-01-03 DIAGNOSIS — U071 COVID-19: Secondary | ICD-10-CM | POA: Diagnosis not present

## 2021-01-03 MED ORDER — ALBUTEROL SULFATE HFA 108 (90 BASE) MCG/ACT IN AERS: 2.0000 | INHALATION_SPRAY | Freq: Once | RESPIRATORY_TRACT | Status: DC | PRN

## 2021-01-03 MED ORDER — DIPHENHYDRAMINE HCL 50 MG/ML IJ SOLN: 50.0000 mg | Freq: Once | INTRAMUSCULAR | Status: DC | PRN

## 2021-01-03 MED ORDER — METHYLPREDNISOLONE SODIUM SUCC 125 MG IJ SOLR: 125.0000 mg | Freq: Once | INTRAMUSCULAR | Status: DC | PRN

## 2021-01-03 MED ORDER — EPINEPHRINE 0.3 MG/0.3ML IJ SOAJ: 0.3000 mg | Freq: Once | INTRAMUSCULAR | Status: DC | PRN

## 2021-01-03 MED ORDER — FAMOTIDINE IN NACL 20-0.9 MG/50ML-% IV SOLN: 20.0000 mg | Freq: Once | INTRAVENOUS | Status: DC | PRN

## 2021-01-03 MED ORDER — SODIUM CHLORIDE 0.9 % IV SOLN: INTRAVENOUS | Status: DC | PRN

## 2021-01-03 MED ORDER — SODIUM CHLORIDE 0.9 % IV SOLN
100.0000 mg | Freq: Once | INTRAVENOUS | Status: AC
  Administered 2021-01-03: 100 mg via INTRAVENOUS

## 2021-01-03 NOTE — Discharge Instructions (Signed)
10 Things You Can Do to Manage Your COVID-19 Symptoms at Home °If you have possible or confirmed COVID-19: °1. Stay home except to get medical care. °2. Monitor your symptoms carefully. If your symptoms get worse, call your healthcare provider immediately. °3. Get rest and stay hydrated. °4. If you have a medical appointment, call the healthcare provider ahead of time and tell them that you have or may have COVID-19. °5. For medical emergencies, call 911 and notify the dispatch personnel that you have or may have COVID-19. °6. Cover your cough and sneezes with a tissue or use the inside of your elbow. °7. Wash your hands often with soap and water for at least 20 seconds or clean your hands with an alcohol-based hand sanitizer that contains at least 60% alcohol. °8. As much as possible, stay in a specific room and away from other people in your home. Also, you should use a separate bathroom, if available. If you need to be around other people in or outside of the home, wear a mask. °9. Avoid sharing personal items with other people in your household, like dishes, towels, and bedding. °10. Clean all surfaces that are touched often, like counters, tabletops, and doorknobs. Use household cleaning sprays or wipes according to the label instructions. °cdc.gov/coronavirus °06/16/2020 °This information is not intended to replace advice given to you by your health care provider. Make sure you discuss any questions you have with your health care provider. °Document Revised: 10/02/2020 Document Reviewed: 10/02/2020 °Elsevier Patient Education © 2021 Elsevier Inc. °If you have any questions or concerns after the infusion please call the Advanced Practice Provider on call at [phone]. This number is ONLY intended for your use regarding questions or concerns about the infusion post-treatment side-effects.  Please do not provide this number to others for use. For return to work notes please contact your primary care provider.   ° °If someone you know is interested in receiving treatment please have them contact their MD for a referral or visit [email or web address] ° ° ° °

## 2021-01-03 NOTE — Progress Notes (Signed)
  Diagnosis: COVID-19  Physician: Dr. Joya Gaskins   Procedure: [facility] Infusion Clinic Med: remdesivir infusion - Provided patient with remdesivir fact sheet for patients, parents and caregivers prior to infusion.  Complications: No immediate complications noted.  Discharge: Discharged home   [name] [name] 01/03/2021

## 2021-01-04 ENCOUNTER — Telehealth: Payer: Self-pay | Admitting: *Deleted

## 2021-01-04 ENCOUNTER — Other Ambulatory Visit: Payer: Self-pay

## 2021-01-04 DIAGNOSIS — R42 Dizziness and giddiness: Secondary | ICD-10-CM | POA: Diagnosis not present

## 2021-01-04 DIAGNOSIS — R5383 Other fatigue: Secondary | ICD-10-CM

## 2021-01-04 NOTE — Telephone Encounter (Signed)
Medication Samples have been provided to the patient.  Medication Samples have been provided to the patient.  Drug name: Eliquis       Strength: 73m        Qty: 2 boxes                   LOT: [id number]                    Exp.Date: 4/24

## 2021-01-05 LAB — BASIC METABOLIC PANEL
BUN/Creatinine Ratio: 23 (ref 12–28)
BUN: 24 mg/dL (ref 8–27)
CO2: 23 mmol/L (ref 20–29)
Calcium: 9.2 mg/dL (ref 8.7–10.3)
Chloride: 103 mmol/L (ref 96–106)
Creatinine, Ser: 1.04 mg/dL — ABNORMAL HIGH (ref 0.57–1.00)
GFR calc Af Amer: 59 mL/min/{1.73_m2} — ABNORMAL LOW (ref >59)
GFR calc non Af Amer: 51 mL/min/{1.73_m2} — ABNORMAL LOW (ref >59)
Glucose: 84 mg/dL (ref 65–99)
Potassium: 4.9 mmol/L (ref 3.5–5.2)
Sodium: 141 mmol/L (ref 134–144)

## 2021-01-05 LAB — CBC
Hematocrit: 43.3 % (ref 34.0–46.6)
Hemoglobin: 14.8 g/dL (ref 11.1–15.9)
MCH: 29 pg (ref 26.6–33.0)
MCHC: 34.2 g/dL (ref 31.5–35.7)
MCV: 85 fL (ref 79–97)
Platelets: 283 10*3/uL (ref 150–450)
RBC: 5.11 x10E6/uL (ref 3.77–5.28)
RDW: 12.7 % (ref 11.7–15.4)
WBC: 11.6 10*3/uL — ABNORMAL HIGH (ref 3.4–10.8)

## 2021-01-05 LAB — TSH: TSH: 3.32 u[IU]/mL (ref 0.450–4.500)

## 2021-01-08 ENCOUNTER — Encounter: Payer: Self-pay | Admitting: Cardiovascular Disease

## 2021-01-08 ENCOUNTER — Other Ambulatory Visit: Payer: Self-pay

## 2021-01-08 ENCOUNTER — Ambulatory Visit (INDEPENDENT_AMBULATORY_CARE_PROVIDER_SITE_OTHER): Payer: Medicare Other | Admitting: Cardiovascular Disease

## 2021-01-08 VITALS — BP 126/73 | HR 83 | Ht 67.0 in | Wt 283.0 lb

## 2021-01-08 DIAGNOSIS — I480 Paroxysmal atrial fibrillation: Secondary | ICD-10-CM

## 2021-01-08 DIAGNOSIS — Z9989 Dependence on other enabling machines and devices: Secondary | ICD-10-CM

## 2021-01-08 DIAGNOSIS — I495 Sick sinus syndrome: Secondary | ICD-10-CM | POA: Diagnosis not present

## 2021-01-08 DIAGNOSIS — U071 COVID-19: Secondary | ICD-10-CM

## 2021-01-08 DIAGNOSIS — Z950 Presence of cardiac pacemaker: Secondary | ICD-10-CM

## 2021-01-08 DIAGNOSIS — Z7901 Long term (current) use of anticoagulants: Secondary | ICD-10-CM | POA: Diagnosis not present

## 2021-01-08 DIAGNOSIS — G4733 Obstructive sleep apnea (adult) (pediatric): Secondary | ICD-10-CM

## 2021-01-08 DIAGNOSIS — R0600 Dyspnea, unspecified: Secondary | ICD-10-CM

## 2021-01-08 DIAGNOSIS — J84112 Idiopathic pulmonary fibrosis: Secondary | ICD-10-CM

## 2021-01-08 DIAGNOSIS — R0609 Other forms of dyspnea: Secondary | ICD-10-CM

## 2021-01-08 NOTE — Progress Notes (Signed)
Cardiology Office Note    Date:  01/08/2021   ID:  [name] [name], DOB [birth year], MRN [id number]  PCP:  Virgie Dad, MD  Cardiologist:   Sanda Klein, MD   Chief Complaint  Patient presents with  . Atrial Fibrillation    History of Present Illness:  [name] [name] is a 80 y.o. female for follow-up for tachycardia-bradycardia syndrome (sinus node dysfunction and paroxysmal atrial fibrillation, dual-chamber Medtronic pacemaker implanted 2015), chronic diastolic heart failure, mitral valve prolapse, obstructive sleep apnea, pulmonary fibrosis, history of stroke, atherosclerosis of the aorta, prediabetes and morbid obesity.  She was recently diagnosed with COVID-19 infection and had mostly upper respiratory symptoms, improved quickly after receiving oral steroids and monoclonal antibody infusion.  During that time there was a marked increase in her burden of atrial fibrillation, essentially a week-long uninterrupted episode that resolved a couple of days ago.  The increased burden of atrial fibrillation coincided exactly with the symptoms of COVID-19.  Although her average ventricular rate increase substantially during a period of time, it stayed less than 110 bpm.  This is in contrast with her usual pattern of very infrequent episodes of brief paroxysmal atrial fibrillation (4-12 hours every 2 or 3 months).  Otherwise she has normal pacemaker function.  Her Medtronic advisor DR device was implanted in 2015 and has an estimated 4 years of remaining longevity.  He has 67% atrial pacing and less than 0.3% ventricular pacing.  There have been a handful of episodes of "nonsustained VT" which actually appear to be atrial fibrillation with rapid ventricular rates.  There have been no episodes of true VT.  The patient specifically denies any chest pain at rest or with exertion, dyspnea at rest or with usual exertion, orthopnea, paroxysmal nocturnal dyspnea, syncope, palpitations, focal neurological  deficits, intermittent claudication, lower extremity edema, unexplained weight gain, cough, hemoptysis or wheezing.  She has not had any falls, injuries or serious bleeding problems.  She inadvertently took both her Eliquis tablets for the day this morning.   She has evidence of atherosclerosis of the aorta. She does not have angina pectoris. Nuclear stress test in January 2018 was normal/low risk. Her echocardiogram in April 2015 showed evidence of diastolic dysfunction without elevated filling pressures. Similar findings on echo in September 2017 with EF 50-55% and grade 1 diastolic dysfunction without elevated filling pressures. She has not had clinically evident congestive heart failure in the past. 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 She has a long-standing history of obstructive sleep apnea but uses CPAP. 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  She has a lifelong struggle with obesity and has prediabetes.  She is enrolled in Dr. Migdalia Dk bariatric clinic  Past Medical History:  Diagnosis Date  . Alpha-1-antitrypsin deficiency carrier   . Arthritis    "all over"  . Atherosclerosis of aorta ([place])   . Atrial fibrillation ([place])   . Back pain   . Complication of anesthesia    "I woke up during 2 different procedures" (10/04/2014)  . Diastolic dysfunction   . Frequent UTI   . GERD (  gastroesophageal reflux disease)   . Hypertension   . Hypertriglyceridemia   . Insulin resistance   . Long term current use of anticoagulant   .  Multiple lung nodules   . Muscular deconditioning   . MVP (mitral valve prolapse)   . Obesity   . OSA on CPAP   . Osteoarthritis   . Osteopenia    unspecified location  . Pacemaker   . PAF (paroxysmal atrial fibrillation) ([place])   . Positive ANA (antinuclear antibody)   . Presbyesophagus   . Presence of permanent cardiac pacemaker   . Primary osteoarthritis of both hands    neck and spine and hips  . Pulmonary fibrosis ([place])    followed by pulmonolgy  . PVC's (premature ventricular contractions)   . Shortness of breath   . 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 Sleep apnea   . Stroke [facility]) 01/2014   denies deficits on 10/04/2014  . Tachy-brady syndrome ([place]) 10/2014  . TIA (transient ischemic attack)   . Vitamin D deficiency     Past Surgical History:  Procedure Laterality Date  . CARDIOVERSION N/A 09/02/2017   Procedure: CARDIOVERSION;  Surgeon: Sanda Klein, MD;  Location: [facility];  Service: Cardiovascular;  Laterality: N/A;  . COLONOSCOPY  2019  . ESOPHAGEAL DILATION    . EXCISION VAGINAL CYST     benign nodules  . INSERT / REPLACE / REMOVE PACEMAKER  10/04/2014   Medtronic Advisa model [id number] serial number [id number]  . LOOP RECORDER EXPLANT N/A 10/04/2014   Procedure: LOOP RECORDER EXPLANT;  Surgeon: Sanda Klein, MD;  Location: [facility];  Service: Cardiovascular;  Laterality: N/A;  . LOOP RECORDER IMPLANT N/A 04/19/2014   Procedure: LOOP RECORDER IMPLANT;  Surgeon: Sanda Klein, MD;  Location: [facility];  Service: Cardiovascular;  Laterality: N/A;  . NM MYOCAR PERF WALL MOTION  12/18/2007   normal  . PERMANENT PACEMAKER INSERTION N/A 10/04/2014   Procedure: PERMANENT PACEMAKER INSERTION;  Surgeon: Sanda Klein, MD;  Location: [facility];  Service: Cardiovascular;  Laterality: N/A;  . TUBAL LIGATION    . US ECHOCARDIOGRAPHY  05/15/2010   LA mildly dilated,mild mitral annular ca+, AOV mildly sclerotic, mild  asymmetric LVH    Current Medications: Outpatient Medications Prior to Visit  Medication Sig Dispense Refill  . apixaban (ELIQUIS) 5 MG TABS tablet Take 1 tablet (5 mg total) by mouth 2 (two) times daily. 180 tablet 3  . B Complex-C (SUPER B COMPLEX PO) Take 1 tablet by mouth daily.    . Biotin 5000 MCG CAPS Take 5,000 mcg by mouth daily.     . Calcium Carbonate-Vit D-Min (CALCIUM 1200 PO) Take 1,200 mg by mouth daily.     . cholecalciferol (VITAMIN D) 1000 UNITS tablet Take 6,000 Units by mouth daily.     Marland Kitchen Co-Enzyme Q10 100 MG CAPS Take 100 mg by mouth daily.     . diazepam (VALIUM) 5 MG tablet TAKE 1 TABLET EACH DAY. (Patient taking differently: Take 5 mg by mouth daily as needed for anxiety. TAKE 1 TABLET EACH DAY.) 30 tablet 0  . doxycycline (VIBRA-TABS) 100 MG tablet Take 1 tablet (100 mg total) by mouth 2 (two) times daily. 14 tablet 0  . fluticasone (FLONASE) 50 MCG/ACT nasal spray Place 2 sprays into both nostrils as needed for allergies or rhinitis.    . folic acid (FOLVITE) 409 MCG tablet Take 400 mcg by mouth daily.    Marland Kitchen  glucosamine-chondroitin 500-400 MG tablet Take 1 tablet by mouth 2 (two) times daily.     Marland Kitchen loratadine (CLARITIN) 10 MG tablet Take 10 mg by mouth daily.    Marland Kitchen MAGNESIUM CITRATE PO Take 400 mg by mouth daily. 1 tab daily    . Multiple Vitamins-Minerals (ZINC PO)     . nitroGLYCERIN (NITROSTAT) 0.4 MG SL tablet Place 1 tablet (0.4 mg total) under the tongue every 5 (five) minutes as needed for chest pain. 25 tablet 1  . Pirfenidone (ESBRIET) 267 MG TABS Take 2 tablets (534 mg total) by mouth with breakfast, with lunch, and with evening meal. 180 tablet 11  . Resveratrol 250 MG CAPS Take 250 mg by mouth daily.    . sertraline (ZOLOFT) 100 MG tablet TAKE 1 TABLET EACH DAY. 90 tablet 0  . TURMERIC PO Take 1,000 mg by mouth daily.    . predniSONE (DELTASONE) 10 MG tablet Take 4 tabs x1 day, 3 tabs x1 day, 2 tabs x1 day, 1 tab x1 day and then 1/2 tab x1 day. 11 tablet 0    No facility-administered medications prior to visit.     Allergies:   Ancef [cefazolin], Pseudoephedrine hcl, Ergotamine, Pentobarbital, and Caffeine   Social History   Socioeconomic History  . Marital status: Divorced    Spouse name: Not on file  . Number of children: 3  . Years of education: college  . Highest education level: Not on file  Occupational History  . Occupation: Retired Engineer, site  Tobacco Use  . Smoking status: Former Smoker    Packs/day: 2.00    Years: 18.00    Pack years: 36.00    Types: Cigarettes    Quit date: 07/18/1970    Years since quitting: 50.5  . Smokeless tobacco: Never Used  . Tobacco comment: "quit smoking in 1971"  Vaping Use  . Vaping Use: Never used  Substance and Sexual Activity  . Alcohol use: Yes    Alcohol/week: 0.0 standard drinks    Comment: 10/04/2014 "might have a drink a couple times/yr"  . Drug use: No  . Sexual activity: Never  Other Topics Concern  . Not on file  Social History Narrative   Lives at the [facility]    Patient is right handed.   Patient has a college degree.   Patient is retired.      Social History      Diet? Awful sweets are a terrible addiction       Do you drink/eat things with caffeine? some      Marital status?          divorced                          What year were you married? 1960      Do you live in a house, apartment, assisted living, condo, trailer, etc.? Friends [place]       Is it one or more stories? 1st level      How many persons live in your home? 1      Do you have any pets in your home? (please list) no       Highest level of education completed? College graduate      Current or past profession: realtor       Do you exercise?             Not much  Type & how often? Barely any  - bed exercise for injured knee      Advanced Directives      Do you have a living will? yes      Do you have a DNR form?                                  If not,  do you want to discuss one?yes      Do you have signed POA/HPOA for forms? yes      Functional Status      Do you have difficulty bathing or dressing yourself? No       Do you have difficulty preparing food or eating? No       Do you have difficulty managing your medications? No       Do you have difficulty managing your finances? No       Do you have difficulty affording your medications? No  Except eloquist after donut hole - so far I have been able to get samples from cardiologist      Social Determinants of Health   Financial Resource Strain: Not on file  Food Insecurity: Not on file  Transportation Needs: Not on file  Physical Activity: Not on file  Stress: Not on file  Social Connections: Not on file     Family History:  The patient's family history includes Alcoholism in her brother; Alpha-1 antitrypsin deficiency in her daughter; Arthritis in her son; Breast cancer in her daughter; Diabetes in her son; Healthy in her brother, daughter, and sister; Heart attack in her mother; Heart failure in her father; Prostate cancer in her brother, brother, and brother; Stroke in her brother, brother, and father; Sudden death in her mother.   ROS:   Please see the history of present illness.    All other systems are reviewed and are negative.   PHYSICAL EXAM:   VS:  BP 126/73   Pulse 83   Ht _0  (1.702 m)   Wt 283 lb (128.4 kg)   LMP  (LMP Unknown)   SpO2 97%   BMI 44.32 kg/m      General: Alert, oriented x3, no distress, morbidly obese.  Healthy left subclavian pacemaker site. Head: no evidence of trauma, PERRL, EOMI, no exophtalmos or lid lag, no myxedema, no xanthelasma; normal ears, nose and oropharynx Neck: normal jugular venous pulsations and no hepatojugular reflux; brisk carotid pulses without delay and no carotid bruits Chest: clear to auscultation, no signs of consolidation by percussion or palpation, normal fremitus, symmetrical and full respiratory  excursions Cardiovascular: normal position and quality of the apical impulse, regular rhythm, normal first and second heart sounds, no murmurs, rubs or gallops Abdomen: no tenderness or distention, no masses by palpation, no abnormal pulsatility or arterial bruits, normal bowel sounds, no hepatosplenomegaly Extremities: no clubbing, cyanosis or edema; 2+ radial, ulnar and brachial pulses bilaterally; 2+ right femoral, posterior tibial and dorsalis pedis pulses; 2+ left femoral, posterior tibial and dorsalis pedis pulses; no subclavian or femoral bruits Neurological: grossly nonfocal Psych: Normal mood and affect   Wt Readings from Last 3 Encounters:  01/08/21 283 lb (128.4 kg)  12/04/20 274 lb 12.8 oz (124.6 kg)  11/09/20 274 lb 6.4 oz (124.5 kg)      Studies/Labs Reviewed:   ECHO 05/18/2019   1. The left ventricle has low normal systolic function, with an ejection fraction of 50-55%.  The cavity size was normal. Left ventricular diastolic function could not be evaluated.  2. The right ventricle has normal systolic function. The cavity was normal.  3. The mitral valve is grossly normal.  4. The tricuspid valve is grossly normal.  5. The aortic valve is tricuspid. Mild thickening of the aortic valve. No stenosis of the aortic valve.  6. Normal LV systolic function; no significant valvular heart disease.  EKG:  EKG is ordered today.  It shows atrial paced, ventricular sensed rhythm with long AV delay, no repolarization abnormalities, QTC 430 ms. Recent Labs: 12/04/2020: ALT 13 01/04/2021: BUN 24; Creatinine, Ser 1.04; Hemoglobin 14.8; Platelets 283; Potassium 4.9; Sodium 141; TSH 3.320   Lipid Panel    Component Value Date/Time   CHOL 195 07/28/2020 0913   CHOL 176 06/03/2018 1022   TRIG 133 07/28/2020 0913   HDL 55 07/28/2020 0913   HDL 53 06/03/2018 1022   CHOLHDL 3.5 07/28/2020 0913   VLDL 33.6 01/15/2017 1417   LDLCALC 115 (H) 07/28/2020 0913   LDLDIRECT 81.0 07/15/2016 1419     ASSESSMENT:    1. PAF (paroxysmal atrial fibrillation) ([place])   2. SSS (sick sinus syndrome) ([place])   3. Pacemaker   4. Dyspnea on exertion   5. Long term current use of anticoagulant   6. Morbid obesity ([place])   7. OSA on CPAP, compliant   8. COVID-19   9. Fibrosis, idiopathic pulmonary (HCC)      PLAN:  In order of problems listed above:  1. AFib: Start increase in the burden of atrial fibrillation during COVID-19 infection, now resolved.  Rate control remains adequate, although the average ventricular rate is clearly increased.  CHADSVasc 5 (Age 31, gender, TIA). Antiarrhythmics are not indicated.   2. SSS: Still requires roughly 67% atrial pacing without taking any rate control medications, consistent with sinus node dysfunction.  Heart rate histogram distribution is appropriate. 3. PPM: Device function, continue remote downloads every 3 months.  No changes made to device settings. 4. Dyspnea: At this point does not appear to be related to heart failure, no evidence of hypervolemia by clinical exam.  She is not receiving any diuretics at this time.  Her dyspnea has always been felt to be multifactorial, related to obesity, lung fibrosis, COPD. BNP normal in 2018. Echo 2017 with normal EF, mild diastolic dysfunction without evidence of elevated filling pressures.  Echo performed in June 2020 showed no findings, but diastolic function could not be adequately assessed. 5. Anticoagulation: No bleeding complications. 6. Obesity: No progress with weight loss.  In fact her weight has steadily increased over the last year. 7. OSA: reports compliance with CPAP.  Denies daytime hypersomnolence. 8. COVID-19 infection: Appears to be pretty much over it, just some mild residual nonproductive cough.  Has completed her course of steroids.   9. IPF:  On Esbriet. Has a follow-up visit later this month with Dr. Chase Caller.    Medication Adjustments/Labs and Tests Ordered: Current medicines are  reviewed at length with the patient today.  Concerns regarding medicines are outlined above.  Medication changes, Labs and Tests ordered today are listed in the Patient Instructions below. Patient Instructions  Medication Instructions:  No changes *If you need a refill on your cardiac medications before your next appointment, please call your pharmacy*   Lab Work: None ordered If you have labs (blood work) drawn today and your tests are completely normal, you will receive your results only by: Marland Kitchen MyChart Message (if you  have MyChart) OR . A paper copy in the mail If you have any lab test that is abnormal or we need to change your treatment, we will call you to review the results.   Testing/Procedures: None ordered   Follow-Up: At [facility], you and your health needs are our priority.  As part of our continuing mission to provide you with exceptional heart care, we have created designated Provider Care Teams.  These Care Teams include your primary Cardiologist (physician) and Advanced Practice Providers (APPs -  Physician Assistants and Nurse Practitioners) who all work together to provide you with the care you need, when you need it.  We recommend signing up for the patient portal called "MyChart".  Sign up information is provided on this After Visit Summary.  MyChart is used to connect with patients for Virtual Visits (Telemedicine).  Patients are able to view lab/test results, encounter notes, upcoming appointments, etc.  Non-urgent messages can be sent to your provider as well.   To learn more about what you can do with MyChart, go to [email or web address].    Your next appointment:   12 month(s)  The format for your next appointment:   In Person  Provider:   Sanda Klein, MD      Signed, Sanda Klein, MD  01/08/2021 11:05 AM    [address], [place], [place]  [address] Phone: [phone]; Fax: [phone]

## 2021-01-08 NOTE — Patient Instructions (Signed)

## 2021-01-09 ENCOUNTER — Ambulatory Visit: Payer: Medicare Other | Admitting: Physician Assistant

## 2021-01-10 ENCOUNTER — Ambulatory Visit (INDEPENDENT_AMBULATORY_CARE_PROVIDER_SITE_OTHER): Payer: Medicare Other

## 2021-01-10 DIAGNOSIS — I495 Sick sinus syndrome: Secondary | ICD-10-CM | POA: Diagnosis not present

## 2021-01-11 DIAGNOSIS — R41841 Cognitive communication deficit: Secondary | ICD-10-CM | POA: Diagnosis not present

## 2021-01-12 LAB — CUP PACEART REMOTE DEVICE CHECK
Battery Remaining Longevity: 49 mo
Battery Voltage: 2.98 V
Brady Statistic AP VP Percent: 0.05 %
Brady Statistic AP VS Percent: 74.17 %
Brady Statistic AS VP Percent: 0.01 %
Brady Statistic AS VS Percent: 25.77 %
Brady Statistic RA Percent Paced: 72.06 %
Brady Statistic RV Percent Paced: 0.06 %
Date Time Interrogation Session: 20220210125738
Implantable Lead Implant Date: 20151103
Implantable Lead Implant Date: 20151103
Implantable Lead Location: 753859
Implantable Lead Location: 753860
Implantable Lead Model: 5076
Implantable Lead Model: 5076
Implantable Pulse Generator Implant Date: 20151103
Lead Channel Impedance Value: 399 Ohm
Lead Channel Impedance Value: 437 Ohm
Lead Channel Impedance Value: 456 Ohm
Lead Channel Impedance Value: 475 Ohm
Lead Channel Pacing Threshold Amplitude: 0.5 V
Lead Channel Pacing Threshold Amplitude: 0.5 V
Lead Channel Pacing Threshold Pulse Width: 0.4 ms
Lead Channel Pacing Threshold Pulse Width: 0.4 ms
Lead Channel Sensing Intrinsic Amplitude: 4.5 mV
Lead Channel Sensing Intrinsic Amplitude: 4.5 mV
Lead Channel Sensing Intrinsic Amplitude: 5.375 mV
Lead Channel Sensing Intrinsic Amplitude: 5.375 mV
Lead Channel Setting Pacing Amplitude: 1.5 V
Lead Channel Setting Pacing Amplitude: 2 V
Lead Channel Setting Pacing Pulse Width: 0.4 ms
Lead Channel Setting Sensing Sensitivity: 2.8 mV

## 2021-01-15 ENCOUNTER — Telehealth: Payer: Self-pay | Admitting: Cardiovascular Disease

## 2021-01-15 NOTE — Telephone Encounter (Signed)
Left message to call back No notation of who called patient from heartcare

## 2021-01-15 NOTE — Telephone Encounter (Signed)
New message:    Patient returning a call back stating that some called her name [name] [name]. I did not see a note

## 2021-01-16 DIAGNOSIS — R41841 Cognitive communication deficit: Secondary | ICD-10-CM | POA: Diagnosis not present

## 2021-01-16 NOTE — Progress Notes (Signed)
Remote pacemaker transmission.   

## 2021-01-23 ENCOUNTER — Other Ambulatory Visit: Payer: Self-pay

## 2021-01-23 ENCOUNTER — Ambulatory Visit (INDEPENDENT_AMBULATORY_CARE_PROVIDER_SITE_OTHER): Payer: Medicare Other | Admitting: Internal Medicine

## 2021-01-23 ENCOUNTER — Encounter: Payer: Self-pay | Admitting: Internal Medicine

## 2021-01-23 VITALS — BP 122/72 | HR 92 | Temp 97.1°F | Ht 67.0 in | Wt 279.6 lb

## 2021-01-23 DIAGNOSIS — J84112 Idiopathic pulmonary fibrosis: Secondary | ICD-10-CM | POA: Diagnosis not present

## 2021-01-23 DIAGNOSIS — R21 Rash and other nonspecific skin eruption: Secondary | ICD-10-CM | POA: Diagnosis not present

## 2021-01-23 DIAGNOSIS — Z5181 Encounter for therapeutic drug level monitoring: Secondary | ICD-10-CM

## 2021-01-23 LAB — HEPATIC FUNCTION PANEL
ALT: 11 U/L (ref 0–35)
AST: 18 U/L (ref 0–37)
Albumin: 3.9 g/dL (ref 3.5–5.2)
Alkaline Phosphatase: 77 U/L (ref 39–117)
Bilirubin, Direct: 0.1 mg/dL (ref 0.0–0.3)
Total Bilirubin: 0.4 mg/dL (ref 0.2–1.2)
Total Protein: 7.4 g/dL (ref 6.0–8.3)

## 2021-01-23 NOTE — Patient Instructions (Addendum)
ICD-10-CM   1. IPF (idiopathic pulmonary fibrosis) (HCC)  C16.606 Pulmonary function test  2. Medication monitoring encounter  Z51.81 Hepatic function panel  3. Rash  R21     Pulmonary fibrosis itself is stable compared to last visit You have had significant fatigue because of full dose pirfenidone Glad fatigue is better on lower dose pirfenidone but noted compliance on 2 pill three times daily is difficult  We discussed several options about how to manage moving forward   Rash on chest likely to be bed bug bite  Plan -Continue to monitor rash -Check liver function test today -Take pirfenidone as 3 pills 2 times daily with food  -This will be your maximum dose for pirfenidone.  We will not escalate further past 6 tabs per day  -We can consider nintedanib in the future if the low-dose pirfenidone also fails  -Currently we are not considering nintedanib because of associated Eliquis intake  -Continue oxygen with exertion  - we discussed clinical trials as care option - and agreed you will reflect on it though appreciate fact trials might not best suited for you based on your ethos. Read more about it way below  Follow-up - Check LFT today and in 1 month and in 2 months  - In 3 months do spiro and dlco  -r eturn to see Dr Chase Caller in 3 months - 30 min slot for IPF Followup  xxxxxxxxxxxxxxxx Key Points about Research as Care Option    1. Scientific Purpose  Clinical research is designed to produce generalizable knowledge and to answer questions about the safety and efficacy of intervention(s) under study in order to determine whether or not they may be useful for the care of future patients.  2. Study Procedures  Participation in a trial may involve procedures or tests, in addition to the intervention(s) under study, that are intended only or primarily to generate scientific knowledge and that are otherwise not necessary for patient care.   3. Uncertainty  For  intervention(s) under study in clinical research, there often is less knowledge and more uncertainty about the risks and benefits to a population of trial participants than there is when a doctor offers a patient standard interventions.   4. Adherence to Protocol  Administration of the intervention(s) under study is typically based on a strict protocol with defined dose, scheduling, and use or avoidance of concurrent medications, compared to administration of standard interventions.  5. Clinician as Investigator  Clinicians who are in health care settings provide treatment; in a clinical trial setting, they are also investigating safety and efficacy of an intervention. In otherwise your doctor or nurse practitioner can be wearing 2 hats - one as care giver another as Company secretary  6. Patient as Visual merchandiser Subject  Patients participating in research trials are research subjects or volunteers. In other words participating in research is 100% voluntary and at one's own free weill. The decision to participate or not participate will NOT affect patient care and the doctor-patient relationship in any way  7. Financial Conflict of Interest Disclosure  One or more of the investigators of  the research trial might have an investment interest in [employer], [facility] the clinical trials practice through which the study is being conducted. Study sponsor pays the practice for the conduct of the study.

## 2021-01-23 NOTE — Progress Notes (Signed)
e former smoker followed for dyspnea on exertion, lung nodules, fibrosis, complicated by A. 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 She does not qualify for portable oxygen CT chest 01/06/17-pulmonary fibrosis, small nodules ANA 1:160 PFT 07/19/2019- mild restriction. FVC 2.94/ 93%, FEV1 2.20/ 107%, R .86, FEF25-75% 3.48/ 110%, TLC 74%, DLCO 81%, no resp to BD ------------------------------------------------------------------------------------------  09/21/2018- 80 year old female former smoker followed for dyspnea on exertion, lung nodules, fibrosis, ANA 4:171, complicated by A. 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 Feeling Sob with activity today. Previously completed pulmonary rehab. Body weight 250 lbs No acute changes in her breathing.  Notices a full/bloated feeling especially after meals, but little wheeze or cough.  Still followed by cardiology for A. Fib/pacemaker. Gradually adjusting to loss of daughter who died from end-stage lung disease related to her alpha-1 antitrypsin status. CT chest 04/03/2018- IMPRESSION: 1. Pulmonary nodules are stable to slightly decreased back to 01/06/2017 chest CT, considered benign. 2. 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 No frank honeycombing. No convincing progression since 01/06/2017 high-resolution chest CT study. 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 Usual interstitial pneumonia is unlikely given absence of progression and presence of air trapping. 3. Stable mild patchy air  trapping in both lungs indicative of small airways disease. Aortic Atherosclerosis (ICD10-I70.0).  07/19/2019- 80 year old female former smoker followed for dyspnea on exertion, lung nodules, fibrosis, ANA 2:787, complicated by A. 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 TKR deferred due to Covid. Activity limited to ADLs and not losing weight.  Discussed her CT showing mild stable fibrosis. Also reviewed Echo and PFT. She is anxious, still grieving death of daughter from a1AT resp failure. She presses me to support trial of glutathione because it made her daughter "look better in the face". She wants to see Dr Chase Caller for his expertise re interstitial lung disease, although she is aware of mild chronic stable fibrosis and stable nodules.   PFT 07/19/2019- mild restriction. FVC 2.94/ 93%, FEV1 2.20/ 107%, R .86, FEF25-75% 3.48/ 110%, TLC 74%, DLCO 81%, no resp to BD  ECHO- 05/18/2019-  1. The left ventricle has low normal systolic function, with an ejection fraction of 50-55%. The cavity size was normal. Left ventricular diastolic function could not be evaluated.  2. The right ventricle has normal systolic function. The cavity was normal.  3. The mitral valve is grossly normal.  4. The tricuspid valve is grossly normal.  5. The aortic valve is tricuspid. Mild thickening of the aortic valve. No stenosis of the aortic valve.  6. Normal LV systolic function; no significant valvular heart disease.  CT chest 04/05/2019- IMPRESSION: No acute cardiopulmonary disease. Mild chronic stable fibrotic change. Several small subcentimeter mostly peripheral pulmonary nodules with the largest measuring 7 mm over the posterolateral right lower lobe. These are unchanged for greater than 2 years and therefore considered benign. Aortic Atherosclerosis (ICD10-I70.0).   OV  07/21/2019  Subjective:  [name], female , DOB: 1941-06-06 , age 60 y.o. , MRN:  [id number] , ADDRESS: [address] [place] [address]   07/21/2019 -   Chief Complaint  Patient presents with  . Follow-up    SOB worsening over past 6 months - increased weight gain     HPI [name] [name] 80 y.o. -is a friend ofl 1 of my pulmonary fibrosis patients.  She is also a patient of Dr. Baird Lyons in our clinic as described above.  She is here to the ILD clinic for second opinion about her pulmonary fibrosis.  She tells me that she was diagnosed with pulmonary fibrosis a few years ago and it has been stable.  Is been of insidious onset.  She had limited autoimmune profile as documented below in 2018 when she had a positive ANA but of low titer.  Her dyspnea is been stable.  She is dealing with other medical issues such as obesity, using a walker because of knee issues and a stroke and also death of her daughter from alpha-1 antitrypsin disease approximately 2 years ago.  She says she has some dyspnea on exertion relieved by rest but overall this is stable.  The dyspnea is definitely significant and out of proportion to the level of ILD as documented by her pulmonary function test which shows stability.  Her CT high-resolution that I was personally visualized from a year ago and his CT chest from 2020 showed bilateral bibasal reticular pattern but no honeycombing or traction bronchiectasis.  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  I do not see any features of alternate diagnosis.  She denies any autoimmune features.  She denies having mold in the house of pet birds.  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       OV 08/10/2020   Subjective:  [name], female , DOB: [birth year], age 57 y.o. years. , MRN: [id number],  ADDRESS: [address] [place] [address] PCP  [name], MD Providers : Treatment Team:   Attending Provider: Brand Males, MD   Chief Complaint  Patient presents with  . 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  Indeterminate in 2020 CT scan.  Autoimmune negative except for ANA 1: 160.  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  [Daughter died from alpha-1 CC]  Obesity   Diastolic dysfunction  Atrial fibrillation on Eliquis  Obstructive sleep apnea on CPAP without oxygen   HPI [name] [name] 80 y.o. -last seen just over a year ago.  Since then not seen.  She tells me that she think she might be a little bit worse in terms of her shortness of breath than before.  But at the same time she says she is morbidly obese and has cardiac issues.  She also has shoulder rotator cuff tear issues.  She feels that she might be short of breath because of that.  In terms of ILD symptom questionnaire is documented below.  Symptoms are actually a little bit better than last year.  She had pulmonary function test and this is stable.  She had high-resolution CT chest.  Dr. 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  In terms of her walking desaturation test: desaturated -she desaturated the end of 1 lap.  She required 2 L  of nasal cannula to correct.  She said that after that she started feeling better.  She also reported that when she did physical therapy she felt as exhausted and sleeps a lot.   Noted that she is on polypharmacy has obesity, heart disease and is on Eliquis   OV 09/26/2020   Subjective:  [name], female , DOB: [birth year], age 48 y.o. years. , MRN: [id number],  ADDRESS: [address] [place] [address] PCP  Virgie Dad, MD Providers : Treatment Team:  Attending Provider: Brand Males, MD Patient Care Team: Virgie Dad, MD as PCP - General  (Internal Medicine) Sanda Klein, MD as PCP - Cardiology (Cardiology) Maisie Fus, MD as Consulting Physician (Obstetrics and Gynecology) Richmond Campbell, MD as Consulting Physician (Gastroenterology) Croitoru, Dani Gobble, MD as Consulting Physician (Cardiology) Kathie Rhodes, MD (Inactive) as Consulting Physician (Urology) Danella Sensing, MD as Consulting Physician (Dermatology) Brand Males, MD as Consulting Physician (Pulmonary Disease) Bo Merino, MD as Consulting Physician (Rheumatology) Barbaraann Cao, OD as Referring Physician (Optometry) Berenice Primas, MD as Referring Physician (Orthopedic Surgery)    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  Indeterminate in 2019-02-04 CT scan.  Autoimmune negative except for ANA 1: 160.  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  [Daughter died from alpha-1 CC]  Obesity   Diastolic dysfunction  Atrial fibrillation on Eliquis  Obstructive sleep apnea on CPAP without oxygen  Chief Complaint  Patient presents with  . Follow-up    ILD, still SOB, cannot get O2 from [employer]       HPI [name] [name] 80 y.o. -returns for follow-up of IPF.  This visit is to make sure that pirfenidone start has gone well.  She says she is now into 4 weeks of pirfenidone.  She is taking 3 pills 3 times daily for the last 1 week.  So far no GI site issues.  Her baseline symptom profile includes fatigue and it is stable.  Dyspnea stable.  She is frustrated by the lack of having portable oxygen system.  She says the oxygen DME company is yet to provide her.  Apparently my office did not do the right paperwork but she thinks it is the DME  company.  Nevertheless she wants to try another DME company.  She also has some occasional mild intermittent epistaxis by using her oxygen which dries her nose up.  Otherwise overall stable.  She is inquiring about the Covid booster.  She has not yet had a flu shot but will have it today.    OV 11/09/2020   Subjective:  [name], female , DOB: 01/30/1941, age 35 y.o. years. , MRN: [id number],  ADDRESS: [address] [place] [address] PCP  [name], MD Providers : Treatment Team:  Attending Provider: Brand Males, MD Patient Care Team: [name], MD as PCP - General (Family Medicine) Sanda Klein, MD as PCP - Cardiology (Cardiology) Maisie Fus, MD as Consulting Physician (Obstetrics and Gynecology) Richmond Campbell, MD as Consulting Physician (Gastroenterology) Croitoru, Dani Gobble, MD as Consulting Physician (Cardiology) Kathie Rhodes, MD (Inactive) as Consulting Physician (Urology) Ronnald Ramp,  Quillian Quince, MD as Consulting Physician (Dermatology) Brand Males, MD as Consulting Physician (Pulmonary Disease) Bo Merino, MD as Consulting Physician (Rheumatology) Barbaraann Cao, OD as Referring Physician (Optometry) Berenice Primas, MD as Referring Physician (Orthopedic Surgery)    Chief Complaint  Patient presents with  . Follow-up    ILD, doing ok, feels she is forgetting more and not thinking clearly.      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 -returns for follow-up of IPF.  This visit is to ensure continued tolerance of pirfenidone and to address other issues.  She tells me that approximately a week ago because of severe fatigue she stopped the pirfenidone.  After this the fatigue resolved.  She is wondering about future options in her therapy.  We discussed nintedanib but she is on Eliquis and she has a history of congestive heart failure although not necessarily coronary artery disease.  She is a little bit wary about starting nintedanib because of this.  We discussed the other option of restarting the the pirfenidone but going for a submaximal dose of 2 pills 3 times daily with gradual up titration.  She is open to this.  Therefore we will start at 1 pill 3 times daily which she will continue through the end of this year/month and then next month/next day we will start at 2 pills 3 times daily which would be her maximum dose.  She use oxygen with exertion.  The overnight oxygen saturation test has not been done yet.  We do not know why.  It is possible that she might not be answering her phone calls.      OV 01/23/2021   Subjective:  Patient ID: BMarcellina [name] female , DOB: [birth year] age 80y.o. years. , MRN: [id number]  [address] [address]PC[place]  MKathyrn Lass MD Providers : Treatment Team:  Attending Provider: RBrand Males MD Patient Care Team: MKathyrn Lass MD as PCP - General (Family Medicine) Croitoru, MDani Gobble MD as PCP - Cardiology (Cardiology) NMaisie Fus MD as Consulting Physician (Obstetrics and Gynecology) MRichmond Campbell MD as Consulting Physician (Gastroenterology) Croitoru, MDani Gobble MD as Consulting Physician (Cardiology) OKathie Rhodes MD (Inactive) as Consulting Physician (Urology) JDanella Sensing MD as  Consulting Physician (Dermatology) RBrand Males MD as Consulting Physician (Pulmonary Disease) DBo Merino MD as Consulting Physician (Rheumatology) MBarbaraann Cao OD as Referring Physician (Optometry) GBerenice Primas MD as Referring Physician (Orthopedic Surgery)    Chief Complaint  Patient presents with  . Follow-up    SOB with exertion       Follow-up IPF   - sept 09, 2021 update - IPF dx givien: age greater than 654 only trace positive ANAdescription of small hiatal hernia on CT scan,  the type of pattern on the CT scan called probable UIP, autoimmune antibodies negative last year    - last CT July 2021   -last PFT sept 201  - portable o2 since sept 2021  -esbriet start end sept 2021 -> stopped 11/02/20 due to fatigue -> rstar 11/09/2020 -. 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 -returns for follow-up. Last visit she had fatigue with pirfenidone therefore we reduce the total intake of pirfenidone to a max of 6 pills 3 times daily. Visit to third of the optimal dose which is still effective. She is taking 2 pills 3 times daily now since early part of January 2022. She finds this to be quite tolerable without any fatigue. But then she also admitted that she misses the afternoon dose on many/some days. She did have incidental COVID on 12/28/2020. There are no GI symptoms  She is complaining of 3 punctate to 4 punctate lesions on her upper chest. There apparently bedbugs in the place where she lives. There is no rash in the sun exposed areas.     SYMPTOM SCALE - ILD 07/21/2019  08/10/2020 262# 09/26/2020 Last Weight  Most recent update:  09/26/2020  1:42 PM   Weight  120.9 kg (266 lb 9.6 oz)            11/09/2020 274# 01/23/2021 279#  O2 use ra ra ra o2 with ex 02 with ex  Shortness of Breath 0 -> 5 scale with 5 being worst (score 6 If unable to do)      At rest 0 0  5 0  Simple tasks - showers, clothes change, eating, shaving _0 Household (dishes, doing bed, laundry) _1 Shopping _2 Walking level at own pace _3 Walking up Stairs _4 Total (40 - 48) Dyspnea Score _5 How bad is your cough? 1 1-_6 How bad is your fatigue _7 nausea  0 0 0 0  vomit  0 0 0 0  diarrhea  0 0 0 1  axiety  2 1 0 0  depression  2 1._8 Simple office walk 185 feet x  3 laps goal with forehead probe 07/21/2019  08/10/2020  01/23/2021   O2 used ra ra ra  Number laps completed 3 laps 3 3  Comments about pace modertae with walker Slow pace Sow pace  Resting Pulse Ox/HR 97% and 78/min 100% and 74/min 94%and HR 92  Final Pulse Ox/HR 93% and 109/min 77% and 85/min 90 % and HR 100  Desaturated </= 88% no no no  Desaturated <= 3% points Yes, 4 Yes, 13 points Yes, 4 piints  Got Tachycardic >/= 90/min you have pulmonary fibrosis otherwise call interstitial lung disease yes no yes  Symptoms at end of test dyspnea Mild dyspnea Dyspnea throughtou  Miscellaneous comments x Corrected with 2LNC and got     PFT Results Latest Ref Rng & Units  08/10/2020 07/19/2019 02/24/2017  FVC-Pre L 2.57 2.74 2.58  FVC-Predicted Pre % 84 93 85  FVC-Post L - 2.63 2.45  FVC-Predicted Post % - 89 81  Pre FEV1/FVC % % 86 86 82  Post FEV1/FCV % % - 90 87  FEV1-Pre L 2.22 2.36 2.12  FEV1-Predicted Pre % 97 107 93  FEV1-Post L - 2.35 2.14  DLCO uncorrected ml/min/mmHg 15.83 16.52 16.15  DLCO UNC% % 76 81 59  DLCO corrected ml/min/mmHg 15.34 - 14.93  DLCO COR %Predicted % 73 - 55  DLVA Predicted % 97 104 79  TLC L 4.50 3.97 -  TLC % Predicted % 81 74 -  RV % Predicted % 71 52 -       Results for  [name] [name], [name] [name] (MRN [id number]) as of 07/21/2019 15:57  Ref. Range 02/24/2017 15:55  Anti Nuclear Antibody (ANA) Latest Ref Range: NEGATIVE  POS (A)  ANA Pattern 1 Unknown HOMOGENEOUS (A)  ANA Titer 1 Latest Units: titer 1:160 (H)  RA Latex Turbid. Latest Ref Range: <14 IU/mL <14     CT chest high resolution June 2021   CLINICAL DATA:  80 year old female with history of interstitial lung disease.  EXAM: CT CHEST WITHOUT CONTRAST  TECHNIQUE: Multidetector CT imaging of the chest was performed following the standard protocol without intravenous contrast. High resolution imaging of the lungs, as well as inspiratory and expiratory imaging, was performed.  COMPARISON:  Chest CT 04/05/2019.  FINDINGS: Cardiovascular: Heart size is normal. There is no significant pericardial fluid, thickening or pericardial calcification. There is aortic atherosclerosis, as well as atherosclerosis of the great vessels of the mediastinum and the coronary arteries, including calcified atherosclerotic plaque in the left circumflex coronary artery.  Mediastinum/Nodes: No pathologically enlarged mediastinal or hilar lymph nodes. Please note that accurate exclusion of hilar adenopathy is limited on noncontrast CT scans. Small hiatal hernia. No axillary lymphadenopathy.  Lungs/Pleura: Multiple small pulmonary nodules are noted throughout the lungs bilaterally, largest of which is in the right lower lobe (axial image 160 of series 3) measuring 7 x 3 mm (mean diameter 5 mm). These are stable in size and number compared to prior examinations dating back to 2018. No other larger more suspicious appearing pulmonary nodules or masses are noted. No acute consolidative airspace disease. No pleural effusions. High-resolution images demonstrates some patchy areas with mild peripheral predominant ground-glass attenuation, septal thickening, some scattered areas of subpleural reticulation,  peripheral predominant traction bronchiectasis and bronchiolectasis, and possible (but not definitive) early honeycombing most evident in the periphery of the left lower lobe. These findings have a definitive craniocaudal gradient and appear mildly progressive compared to the prior examination. Inspiratory and expiratory imaging demonstrates mild air trapping indicative of small airways disease.  Upper Abdomen: Aortic atherosclerosis. Calcified granuloma in the liver.  Musculoskeletal: There are no aggressive appearing lytic or blastic lesions noted in the visualized portions of the skeleton.  IMPRESSION: 1. The appearance of the lungs is compatible with interstitial lung disease, which appears mildly progressive compared to the prior examination, with a spectrum of findings considered probable usual interstitial pneumonia (UIP) per current ATS guidelines. 2. Small pulmonary nodules measuring 5 mm or less in size, stable dating back to 2018, considered definitively benign. 3. Aortic atherosclerosis, in addition to left circumflex coronary artery disease. Please note that although the presence of coronary artery calcium documents the presence of coronary artery disease, the severity of this disease and any potential stenosis cannot be assessed on this non-gated CT  examination. Assessment for potential risk factor modification, dietary therapy or pharmacologic therapy may be warranted, if clinically indicated.  Aortic Atherosclerosis (ICD10-I70.0).   Electronically Signed   By: Vinnie Langton M.D.   On: 06/01/2020 10:20    has a past medical history of Alpha-1-antitrypsin deficiency carrier, Arthritis, Atherosclerosis of aorta ([place]), Atrial fibrillation ([place]), Back pain, Complication of anesthesia, Diastolic dysfunction, Frequent UTI, GERD (gastroesophageal reflux disease), Hypertension, Hypertriglyceridemia, Insulin resistance, Long term current use of anticoagulant,  Multiple lung nodules, Muscular deconditioning, MVP (mitral valve prolapse), Obesity, OSA on CPAP, Osteoarthritis, Osteopenia, Pacemaker, PAF (paroxysmal atrial fibrillation) ([place]), Positive ANA (antinuclear antibody), Presbyesophagus, Presence of permanent cardiac pacemaker, Primary osteoarthritis of both hands, Pulmonary fibrosis ([place]), PVC's (premature ventricular contractions), Shortness of breath, Sinus arrest (10/2014), Sleep apnea, Stroke ([place]) (01/2014), Tachy-brady syndrome ([place]) (10/2014), TIA (transient ischemic attack), and Vitamin D deficiency.   reports that she quit smoking about 50 years ago. Her smoking use included cigarettes. She has a 36.00 pack-year smoking history. She has never used smokeless tobacco.  Past Surgical History:  Procedure Laterality Date  . CARDIOVERSION N/A 09/02/2017   Procedure: CARDIOVERSION;  Surgeon: Sanda Klein, MD;  Location: [facility];  Service: Cardiovascular;  Laterality: N/A;  . COLONOSCOPY  2019  . ESOPHAGEAL DILATION    . EXCISION VAGINAL CYST     benign nodules  . INSERT / REPLACE / REMOVE PACEMAKER  10/04/2014   Med[employer] Advisa model [id number] serial number [id number]  . LOOP RECORDER EXPLANT N/A 10/04/2014   Procedure: LOOP RECORDER EXPLANT;  Surgeon: Sanda Klein, MD;  Location: [facility];  Service: Cardiovascular;  Laterality: N/A;  . LOOP RECORDER IMPLANT N/A 04/19/2014   Procedure: LOOP RECORDER IMPLANT;  Surgeon: Sanda Klein, MD;  Location: [facility];  Service: Cardiovascular;  Laterality: N/A;  . NM MYOCAR PERF WALL MOTION  12/18/2007   normal  . PERMANENT PACEMAKER INSERTION N/A 10/04/2014   Procedure: PERMANENT PACEMAKER INSERTION;  Surgeon: Sanda Klein, MD;  Location: [facility];  Service: Cardiovascular;  Laterality: N/A;  . TUBAL LIGATION    . US ECHOCARDIOGRAPHY  05/15/2010   LA mildly dilated,mild mitral annular ca+, AOV mildly sclerotic, mild asymmetric LVH    Allergies  Allergen Reactions  . Ancef [Cefazolin]  Hives and Itching  . Pseudoephedrine Hcl     Other reaction(s): palpitations (moderate)  . Ergotamine   . Other Other (See Comments)  . Pentobarbital   . Caffeine Palpitations    Immunization History  Administered Date(s) Administered  . Influenza Split 10/09/2009, 09/19/2010, 09/13/2020  . Influenza Whole 09/23/2008  . Influenza, High Dose Seasonal PF 09/13/2018, 09/27/2019, 09/26/2020  . Influenza,inj,quad, With Preservative 09/04/2017  . Influenza-Unspecified 09/17/2014, 09/09/2016, 09/04/2017  . Moderna Sars-Covid-2 Vaccination 12/06/2019, 01/03/2020, 10/16/2020  . Pneumococcal Conjugate-13 09/29/2014, 10/16/2015  . Pneumococcal Polysaccharide-23 04/06/2009, 10/02/2017  . Pneumococcal-Unspecified 10/01/2014, 10/02/2017  . Td 04/05/1999  . Tdap 04/06/2009  . Zoster 12/02/2008, 09/29/2018, 01/19/2019  . Zoster Recombinat (Shingrix) 09/29/2018, 01/19/2019    Family History  Problem Relation Age of Onset  . Heart attack Mother   . Sudden death Mother   . Stroke Father   . Heart failure Father   . Alcoholism Brother   . Healthy Sister   . Stroke Brother   . Alpha-1 antitrypsin deficiency Daughter   . Breast cancer Daughter   . Prostate cancer Brother   . Stroke Brother   . Prostate cancer Brother   . Healthy Brother   . Prostate cancer Brother   .  Healthy Daughter   . Diabetes Son   . Arthritis Son      Current Outpatient Medications:  .  apixaban (ELIQUIS) 5 MG TABS tablet, Take 1 tablet (5 mg total) by mouth 2 (two) times daily., Disp: 180 tablet, Rfl: 3 .  B Complex-C (SUPER B COMPLEX PO), Take 1 tablet by mouth daily., Disp: , Rfl:  .  Biotin 5000 MCG CAPS, Take 5,000 mcg by mouth daily. , Disp: , Rfl:  .  Calcium Carbonate-Vit D-Min (CALCIUM 1200 PO), Take 1,200 mg by mouth daily. , Disp: , Rfl:  .  cholecalciferol (VITAMIN D) 1000 UNITS tablet, Take 6,000 Units by mouth daily. , Disp: , Rfl:  .  Co-Enzyme Q10 100 MG CAPS, Take 100 mg by mouth daily. , Disp:  , Rfl:  .  diazepam (VALIUM) 5 MG tablet, TAKE 1 TABLET EACH DAY. (Patient taking differently: Take 5 mg by mouth daily as needed for anxiety. TAKE 1 TABLET EACH DAY.), Disp: 30 tablet, Rfl: 0 .  doxycycline (VIBRA-TABS) 100 MG tablet, Take 1 tablet (100 mg total) by mouth 2 (two) times daily., Disp: 14 tablet, Rfl: 0 .  fluticasone (FLONASE) 50 MCG/ACT nasal spray, Place 2 sprays into both nostrils as needed for allergies or rhinitis., Disp: , Rfl:  .  folic acid (FOLVITE) 376 MCG tablet, Take 400 mcg by mouth daily., Disp: , Rfl:  .  glucosamine-chondroitin 500-400 MG tablet, Take 1 tablet by mouth 2 (two) times daily. , Disp: , Rfl:  .  loratadine (CLARITIN) 10 MG tablet, Take 10 mg by mouth daily., Disp: , Rfl:  .  MAGNESIUM CITRATE PO, Take 400 mg by mouth daily. 1 tab daily, Disp: , Rfl:  .  Multiple Vitamins-Minerals (ZINC PO), , Disp: , Rfl:  .  nitroGLYCERIN (NITROSTAT) 0.4 MG SL tablet, Place 1 tablet (0.4 mg total) under the tongue every 5 (five) minutes as needed for chest pain., Disp: 25 tablet, Rfl: 1 .  Pirfenidone (ESBRIET) 267 MG TABS, Take 2 tablets (534 mg total) by mouth with breakfast, with lunch, and with evening meal., Disp: 180 tablet, Rfl: 11 .  Resveratrol 250 MG CAPS, Take 250 mg by mouth daily., Disp: , Rfl:  .  sertraline (ZOLOFT) 100 MG tablet, TAKE 1 TABLET EACH DAY., Disp: 90 tablet, Rfl: 0 .  TURMERIC PO, Take 1,000 mg by mouth daily., Disp: , Rfl:       Objective:   Vitals:   01/23/21 1341  BP: 122/72  Pulse: 92  Temp: (!) 97.1 F (36.2 C)  TempSrc: Oral  SpO2: 94%  Weight: 279 lb 9.6 oz (126.8 kg)  Height: 5' 7" (1.702 m)    Estimated body mass index is 43.79 kg/m as calculated from the following:   Height as of this encounter: 5' 7" (1.702 m).   Weight as of this encounter: 279 lb 9.6 oz (126.8 kg).  _0 @  Filed Weights   01/23/21 1341  Weight: 279 lb 9.6 oz (126.8 kg)     Physical Exam   General: No distress. obese Neuro:  Alert and Oriented x 3. GCS 15. Speech normal Psych: Pleasant Resp:  Barrel Chest - no.  Wheeze - no, Crackles - bilateral LL crackles, No overt respiratory distress CVS: Normal heart sounds. Murmurs - no Ext: Stigmata of Connective Tissue Disease - no HEENT: Normal upper airway. PEERL +. No post nasal drip        Assessment:       ICD-10-CM   1.  IPF (idiopathic pulmonary fibrosis) (HCC)  Y78.295 Pulmonary function test  2. Medication monitoring encounter  Z51.81 Hepatic function panel  3. Rash  R21        Plan:     Patient Instructions     ICD-10-CM   1. IPF (idiopathic pulmonary fibrosis) (HCC)  A21.308 Pulmonary function test  2. Medication monitoring encounter  Z51.81 Hepatic function panel  3. Rash  R21     Pulmonary fibrosis itself is stable compared to last visit You have had significant fatigue because of full dose pirfenidone Glad fatigue is better on lower dose pirfenidone but noted compliance on 2 pill three times daily is difficult  We discussed several options about how to manage moving forward   Rash on chest likely to be bed bug bite  Plan -Continue to monitor rash -Check liver function test today -Take pirfenidone as 3 pills 2 times daily with food  -This will be your maximum dose for pirfenidone.  We will not escalate further past 6 tabs per day  -We can consider nintedanib in the future if the low-dose pirfenidone also fails  -Currently we are not considering nintedanib because of associated Eliquis intake  -Continue oxygen with exertion  - we discussed clinical trials as care option - and agreed you will reflect on it though appreciate fact trials might not best suited for you based on your ethos. Read more about it way below  Follow-up - Check LFT today and in 1 month and in 2 months  - In 3 months do spiro and dlco  -r eturn to see Dr Chase Caller in 3 months - 30 min slot for IPF Followup  xxxxxxxxxxxxxxxx Key Points about Research as Care  Option    1. Scientific Purpose  Clinical research is designed to produce generalizable knowledge and to answer questions about the safety and efficacy of intervention(s) under study in order to determine whether or not they may be useful for the care of future patients.  2. Study Procedures  Participation in a trial may involve procedures or tests, in addition to the intervention(s) under study, that are intended only or primarily to generate scientific knowledge and that are otherwise not necessary for patient care.   3. Uncertainty  For intervention(s) under study in clinical research, there often is less knowledge and more uncertainty about the risks and benefits to a population of trial participants than there is when a doctor offers a patient standard interventions.   4. Adherence to Protocol  Administration of the intervention(s) under study is typically based on a strict protocol with defined dose, scheduling, and use or avoidance of concurrent medications, compared to administration of standard interventions.  5. Clinician as Investigator  Clinicians who are in health care settings provide treatment; in a clinical trial setting, they are also investigating safety and efficacy of an intervention. In otherwise your doctor or nurse practitioner can be wearing 2 hats - one as care giver another as Company secretary  6. Patient as Visual merchandiser Subject  Patients participating in research trials are research subjects or volunteers. In other words participating in research is 100% voluntary and at one's own free weill. The decision to participate or not participate will NOT affect patient care and the doctor-patient relationship in any way  7. Financial Conflict of Interest Disclosure  One or more of the investigators of  the research trial might have an investment interest in [employer], [facility] the clinical trials practice through which the study is being  conducted. Study  sponsor pays the practice for the conduct of the study.       SIGNATURE    Dr. Brand Males, M.D., F.C.C.P,  Pulmonary and Critical Care Medicine Staff Physician, [place] Director - Interstitial Lung Disease  Program  [facility] [place], [place], [address]  Pager: [phone], If no answer or between  15:00h - 7:00h: call [phone] Telephone: [phone]  2:16 PM 01/23/2021

## 2021-01-24 DIAGNOSIS — R41841 Cognitive communication deficit: Secondary | ICD-10-CM | POA: Diagnosis not present

## 2021-01-24 NOTE — Progress Notes (Signed)
LFT normal. Will not call normal reesuolt

## 2021-02-05 ENCOUNTER — Telehealth: Payer: Self-pay | Admitting: Cardiovascular Disease

## 2021-02-05 NOTE — Telephone Encounter (Signed)
Spoke with patient and advised Eliquis 5 mg #2 Lot ACA [id number] exp 7/24 at front dest for pick up  Patient does not qualify for patient assistance and $400 was out of pocket deductible

## 2021-02-05 NOTE — Telephone Encounter (Signed)
Patient calling the office for samples of medication:   1.  What medication and dosage are you requesting samples for? apixaban (ELIQUIS) 5 MG TABS tablet  2.  Are you currently out of this medication?   No, but she states Eliquis is now over $400 for a 1 month supply.

## 2021-02-13 DIAGNOSIS — H52223 Regular astigmatism, bilateral: Secondary | ICD-10-CM | POA: Diagnosis not present

## 2021-02-13 DIAGNOSIS — H43813 Vitreous degeneration, bilateral: Secondary | ICD-10-CM | POA: Diagnosis not present

## 2021-02-13 DIAGNOSIS — H2513 Age-related nuclear cataract, bilateral: Secondary | ICD-10-CM | POA: Diagnosis not present

## 2021-02-13 DIAGNOSIS — H524 Presbyopia: Secondary | ICD-10-CM | POA: Diagnosis not present

## 2021-02-13 DIAGNOSIS — H5202 Hypermetropia, left eye: Secondary | ICD-10-CM | POA: Diagnosis not present

## 2021-02-22 DIAGNOSIS — Z1231 Encounter for screening mammogram for malignant neoplasm of breast: Secondary | ICD-10-CM | POA: Diagnosis not present

## 2021-02-25 ENCOUNTER — Other Ambulatory Visit: Payer: Self-pay | Admitting: Internal Medicine

## 2021-02-25 DIAGNOSIS — F331 Major depressive disorder, recurrent, moderate: Secondary | ICD-10-CM

## 2021-03-01 ENCOUNTER — Telehealth: Payer: Self-pay | Admitting: Internal Medicine

## 2021-03-01 DIAGNOSIS — L821 Other seborrheic keratosis: Secondary | ICD-10-CM | POA: Diagnosis not present

## 2021-03-01 DIAGNOSIS — L720 Epidermal cyst: Secondary | ICD-10-CM | POA: Diagnosis not present

## 2021-03-01 DIAGNOSIS — L820 Inflamed seborrheic keratosis: Secondary | ICD-10-CM | POA: Diagnosis not present

## 2021-03-01 NOTE — Telephone Encounter (Signed)
Will forward to MR and EP to follow up on handicap paperwork that was dropped off.

## 2021-03-02 NOTE — Telephone Encounter (Signed)
Called and spoke with pt letting her know that we will get MR to sign the handicap form Monday 4/4. Pt verbalized understanding. Pt stated that once it is signed, she will come by office to pick it up.  Will keep encounter open.

## 2021-03-05 ENCOUNTER — Telehealth: Payer: Self-pay | Admitting: Cardiovascular Disease

## 2021-03-05 NOTE — Telephone Encounter (Signed)
Patient states that some of her friends have a wrist watch that they wear for emergencies and she would like to know if this would interfere with her pace maker. Please advise.

## 2021-03-05 NOTE — Telephone Encounter (Signed)
Handicap placard has been signed by MR and returned to me. Attempted to call pt to see if she wanted to come by office to pick up application or if she wanted it placed in the mail for her but unable to reach. Left message for her to return call.

## 2021-03-05 NOTE — Telephone Encounter (Signed)
Attempted to return pt phone call.  No asnwer, left details message advising that it is ok to wear safety alert device, however please keep 6" away from PM which would mean wearing it on Right wrist.  Provided device clinic # and hours for further questions.

## 2021-03-05 NOTE — Telephone Encounter (Signed)
Patient calling the office for samples of medication:   1.  What medication and dosage are you requesting samples for? apixaban (ELIQUIS) 5 MG TABS tablet  2.  Are you currently out of this medication? No

## 2021-03-05 NOTE — Telephone Encounter (Signed)
Patient coming by office to pick up handicap placard. Patient phone number is [phone].

## 2021-03-05 NOTE — Telephone Encounter (Signed)
Left a message with the patient that we currently do not have any samples available.

## 2021-03-05 NOTE — Telephone Encounter (Signed)
EP pt is coming by the office to pick up her handicap placard.

## 2021-03-06 NOTE — Telephone Encounter (Signed)
Handicap placard application has been placed up front for pt. Called and spoke with pt letting her know this had been done and she verbalized understanding.  Also scheduled pt f/u appts with MR with having PFT prior in May 2022.  While speaking with pt, pt said that she has had some abdominal discomfort on her right side for a few weeks now and she wonders if the discomfort she is having could be coming from the [place] she is taking.  Asked pt if she has had any diarrhea associated and she said that she does have some occ diarrhea but it is not that bad.  Due to the discomfort, pt wants to know what could be recommended. Dr. Chase Caller, please advise.

## 2021-03-06 NOTE — Telephone Encounter (Signed)
Is most likely the pirfenidone is causing the abdominal discomfort and diarrhea.  She is also noticed to be on magnesium citrate and turmeric which can also cause some GI problems.  Plan -She should book an appointment telephone visit with nurse practitioner to go over her symptoms -> and a decision can be made if we are to give her a drug holiday from pirfenidone or change other strategies

## 2021-03-07 ENCOUNTER — Encounter: Payer: Self-pay | Admitting: Acute Care

## 2021-03-07 ENCOUNTER — Ambulatory Visit (INDEPENDENT_AMBULATORY_CARE_PROVIDER_SITE_OTHER): Payer: Medicare Other | Admitting: Acute Care

## 2021-03-07 DIAGNOSIS — J8410 Pulmonary fibrosis, unspecified: Secondary | ICD-10-CM

## 2021-03-07 DIAGNOSIS — R109 Unspecified abdominal pain: Secondary | ICD-10-CM | POA: Diagnosis not present

## 2021-03-07 NOTE — Telephone Encounter (Signed)
Called and spoke with pt letting her know the info stated by MR and she verbalized understanding. Pt has been scheduled a televisit with SG today at 4pm. Nothing further needed.

## 2021-03-07 NOTE — Progress Notes (Signed)
Virtual Visit via Telephone Note  I connected with [name] [name] on 03/07/21 at  4:00 PM EDT by telephone and verified that I am speaking with the correct person using two identifiers.  Location: Patient: At home Provider: [place], [place], [place], [address]   I discussed the limitations, risks, security and privacy concerns of performing an evaluation and management service by telephone and the availability of in person appointments. I also discussed with the patient that there may be a patient responsible charge related to this service. The patient expressed understanding and agreed to proceed.   History of Present Illness: Pt. Presents for GI upset that she feels may be related to her Esbriet. She has complaints of  Occasional abdominal discomfort and diarrhea  periodically. She says this is not unmanageable. She states she has not had any issues for the last several days. She has self dosed herself down to 4 tabs daily instead of the 6 tabs daily prescribed.She wonders if the lower dose has helped. Pain is usually on the right side from th rib cage to the back.  She is also taking  magnesium citrate ( for heart condition) and turmeric ( anti inflammatory.) which can both also cause some GI problems. She does not want to stop taking these medications. She states she started Esbriet about 6 months ago, and this has been an issue for the last 5-6 weeks.She states this is not bad enough to stop taking the medication , but she is worried about causing harm to her liver. She denies any fever, chest pain, orthopnea or hemoptysis. No change in her skin color, no increase in bruising.    Observations/Objective: HRCT 05/31/2020 The appearance of the lungs is compatible with interstitial lung disease, which appears mildly progressive compared to the prior examination, with a spectrum of findings considered probable usual interstitial pneumonia (UIP) per current ATS guidelines. 2. Small  pulmonary nodules measuring 5 mm or less in size, stable dating back to 2018, considered definitively benign. 3. Aortic atherosclerosis, in addition to left circumflex coronary artery disease. Please note that although the presence of coronary artery calcium documents the presence of coronary artery disease, the severity of this disease and any potential stenosis cannot be assessed on this non-gated CT examination. Assessment for potential risk factor modification, dietary therapy or pharmacologic therapy may be warranted, if clinically indicated.  Aortic Atherosclerosis (ICD10-I70.0).  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 Call the office if you have any worsening GI issues or discomfort, so we can consider an Esbriet holiday if necessary. Follow up with Dr. Chase Caller in 1 month per ILD clinic.    Follow Up Instructions: We will call you with the results of your LFT's Follow up with Dr. Chase Caller in 1 month per ILD clinic.  I discussed the assessment and treatment plan with the patient. The patient was provided an opportunity to ask questions and all were answered. The patient agreed with the plan and demonstrated an understanding of the instructions.   The patient was advised to call back or seek an in-person evaluation if the symptoms worsen or if the condition fails to improve as anticipated.  I provided 40 minutes of  non-face-to-face time during this encounter.   Magdalen Spatz, NP 03/07/2021

## 2021-03-09 ENCOUNTER — Telehealth: Payer: Self-pay | Admitting: Cardiovascular Disease

## 2021-03-09 ENCOUNTER — Other Ambulatory Visit (INDEPENDENT_AMBULATORY_CARE_PROVIDER_SITE_OTHER): Payer: Medicare Other

## 2021-03-09 DIAGNOSIS — J8410 Pulmonary fibrosis, unspecified: Secondary | ICD-10-CM

## 2021-03-09 LAB — HEPATIC FUNCTION PANEL
ALT: 14 U/L (ref 0–35)
AST: 18 U/L (ref 0–37)
Albumin: 3.7 g/dL (ref 3.5–5.2)
Alkaline Phosphatase: 71 U/L (ref 39–117)
Bilirubin, Direct: 0.1 mg/dL (ref 0.0–0.3)
Total Bilirubin: 0.4 mg/dL (ref 0.2–1.2)
Total Protein: 6.8 g/dL (ref 6.0–8.3)

## 2021-03-09 NOTE — Telephone Encounter (Signed)
[name] Eliquis 5 mg samples left at front desk.

## 2021-03-09 NOTE — Telephone Encounter (Signed)
Patient calling the office for samples of medication:   1.  What medication and dosage are you requesting samples for? apixaban (ELIQUIS) 5 MG TABS tablet  2.  Are you currently out of this medication? Yes

## 2021-03-12 DIAGNOSIS — I480 Paroxysmal atrial fibrillation: Secondary | ICD-10-CM | POA: Diagnosis not present

## 2021-03-12 DIAGNOSIS — F331 Major depressive disorder, recurrent, moderate: Secondary | ICD-10-CM | POA: Diagnosis not present

## 2021-03-12 DIAGNOSIS — I10 Essential (primary) hypertension: Secondary | ICD-10-CM | POA: Diagnosis not present

## 2021-03-12 DIAGNOSIS — K219 Gastro-esophageal reflux disease without esophagitis: Secondary | ICD-10-CM | POA: Diagnosis not present

## 2021-03-12 DIAGNOSIS — F329 Major depressive disorder, single episode, unspecified: Secondary | ICD-10-CM | POA: Diagnosis not present

## 2021-03-12 DIAGNOSIS — M1712 Unilateral primary osteoarthritis, left knee: Secondary | ICD-10-CM | POA: Diagnosis not present

## 2021-03-12 DIAGNOSIS — G459 Transient cerebral ischemic attack, unspecified: Secondary | ICD-10-CM | POA: Diagnosis not present

## 2021-03-23 ENCOUNTER — Telehealth: Payer: Self-pay | Admitting: Internal Medicine

## 2021-03-23 DIAGNOSIS — J8410 Pulmonary fibrosis, unspecified: Secondary | ICD-10-CM

## 2021-03-23 NOTE — Telephone Encounter (Signed)
Overnight pulse oximetry done on 03/12/2021 shows time spent less than equal to 88% is 40.3 minutes.  Average pulse was 60/min.  Time spent in bradycardia 72.5%  Plan - Start 2 L nasal cannula at night

## 2021-03-26 NOTE — Telephone Encounter (Signed)
Called and spoke with pt letting her know the results of the ONO and stated to her based off the results that she does need to wear 2L O2 at night. Pt verbalized understanding. Pt said that she does have cpap that she wears at night and I stated to her that DME can show her how she can bleed the O2 with the cpap and she verbalized understanding.  Order sent to [facility]. Nothing further needed.

## 2021-04-02 DIAGNOSIS — I480 Paroxysmal atrial fibrillation: Secondary | ICD-10-CM | POA: Diagnosis not present

## 2021-04-02 DIAGNOSIS — F329 Major depressive disorder, single episode, unspecified: Secondary | ICD-10-CM | POA: Diagnosis not present

## 2021-04-02 DIAGNOSIS — K219 Gastro-esophageal reflux disease without esophagitis: Secondary | ICD-10-CM | POA: Diagnosis not present

## 2021-04-02 DIAGNOSIS — G459 Transient cerebral ischemic attack, unspecified: Secondary | ICD-10-CM | POA: Diagnosis not present

## 2021-04-02 DIAGNOSIS — I10 Essential (primary) hypertension: Secondary | ICD-10-CM | POA: Diagnosis not present

## 2021-04-02 DIAGNOSIS — M1712 Unilateral primary osteoarthritis, left knee: Secondary | ICD-10-CM | POA: Diagnosis not present

## 2021-04-02 DIAGNOSIS — F331 Major depressive disorder, recurrent, moderate: Secondary | ICD-10-CM | POA: Diagnosis not present

## 2021-04-05 ENCOUNTER — Telehealth: Payer: Self-pay | Admitting: Internal Medicine

## 2021-04-05 ENCOUNTER — Ambulatory Visit (INDEPENDENT_AMBULATORY_CARE_PROVIDER_SITE_OTHER): Payer: Medicare Other

## 2021-04-05 ENCOUNTER — Ambulatory Visit (INDEPENDENT_AMBULATORY_CARE_PROVIDER_SITE_OTHER): Payer: Medicare Other | Admitting: Adult Health

## 2021-04-05 ENCOUNTER — Other Ambulatory Visit: Payer: Self-pay

## 2021-04-05 ENCOUNTER — Telehealth: Payer: Self-pay | Admitting: Adult Health

## 2021-04-05 ENCOUNTER — Encounter: Payer: Self-pay | Admitting: Adult Health

## 2021-04-05 VITALS — BP 130/80 | HR 90 | Temp 97.9°F | Ht 67.0 in | Wt 274.2 lb

## 2021-04-05 DIAGNOSIS — G4733 Obstructive sleep apnea (adult) (pediatric): Secondary | ICD-10-CM | POA: Diagnosis not present

## 2021-04-05 DIAGNOSIS — J209 Acute bronchitis, unspecified: Secondary | ICD-10-CM | POA: Diagnosis not present

## 2021-04-05 DIAGNOSIS — R0602 Shortness of breath: Secondary | ICD-10-CM | POA: Diagnosis not present

## 2021-04-05 DIAGNOSIS — J8410 Pulmonary fibrosis, unspecified: Secondary | ICD-10-CM

## 2021-04-05 DIAGNOSIS — R059 Cough, unspecified: Secondary | ICD-10-CM

## 2021-04-05 DIAGNOSIS — I639 Cerebral infarction, unspecified: Secondary | ICD-10-CM | POA: Diagnosis not present

## 2021-04-05 DIAGNOSIS — R6883 Chills (without fever): Secondary | ICD-10-CM

## 2021-04-05 DIAGNOSIS — J9611 Chronic respiratory failure with hypoxia: Secondary | ICD-10-CM | POA: Diagnosis not present

## 2021-04-05 DIAGNOSIS — I4891 Unspecified atrial fibrillation: Secondary | ICD-10-CM | POA: Diagnosis not present

## 2021-04-05 DIAGNOSIS — Z9989 Dependence on other enabling machines and devices: Secondary | ICD-10-CM | POA: Diagnosis not present

## 2021-04-05 DIAGNOSIS — I10 Essential (primary) hypertension: Secondary | ICD-10-CM | POA: Diagnosis not present

## 2021-04-05 MED ORDER — DOXYCYCLINE HYCLATE 100 MG PO TABS: 100.0000 mg | ORAL_TABLET | Freq: Two times a day (BID) | ORAL | 0 refills | Status: DC

## 2021-04-05 MED ORDER — BENZONATATE 100 MG PO CAPS: 100.0000 mg | ORAL_CAPSULE | Freq: Three times a day (TID) | ORAL | 2 refills | Status: DC | PRN

## 2021-04-05 MED ORDER — PREDNISONE 20 MG PO TABS: 20.0000 mg | ORAL_TABLET | Freq: Every day | ORAL | 0 refills | Status: DC

## 2021-04-05 NOTE — Assessment & Plan Note (Signed)
Acute bronchitis.  Needs to check for COVID-19 infection. Begin empiric antibiotics and low-dose prednisone Mucolytic's. Check chest x-ray Plan  Patient Instructions  Covid 19 testing call if positive .  Chest xray today  Doxycycline 156m  Twice daily  For 7 days  Prednisone 2108mdaily for 5 days  Mucinex DM Twice daily  As needed  Cough/congestion .  Tessalon Three times a day  As needed  Cough  Continue on CPAP with Oxygen At bedtime   Oxygen with activity as needed.  Follow up with Dr. RaChase Callers planned this month and As needed   Please contact office for sooner follow up if symptoms do not improve or worsen or seek emergency care

## 2021-04-05 NOTE — Patient Instructions (Addendum)
Covid 19 testing call if positive .  Chest xray today  Doxycycline 152m  Twice daily  For 7 days  Prednisone 285mdaily for 5 days  Mucinex DM Twice daily  As needed  Cough/congestion .  Tessalon Three times a day  As needed  Cough  Continue on CPAP with Oxygen At bedtime   Oxygen with activity as needed.  Follow up with Dr. RaChase Callers planned this month and As needed   Please contact office for sooner follow up if symptoms do not improve or worsen or seek emergency care

## 2021-04-05 NOTE — Assessment & Plan Note (Signed)
Currently tolerating Esbriet.  Continue on current regimen.

## 2021-04-05 NOTE — Addendum Note (Signed)
Addended by: Vanessa Barbara on: 04/05/2021 04:40 PM   Modules accepted: Orders

## 2021-04-05 NOTE — Telephone Encounter (Signed)
Can offer acute visit with APP next available   Garner Nash, [facility] 04/05/2021 10:11 AM

## 2021-04-05 NOTE — Telephone Encounter (Signed)
Called and spoke with patient who states that at the beginning of the week she started not feeling well. Patient states that her chest and head is congested, has been taking mucinex and its not helping, productive cough with clear sputum at this time and some wheezing. Has not been tested for Covid and has not checked for a fever. Patient has already had covid and has 3 vaccines.  Dr. Valeta Harms please advise on behalf of MR

## 2021-04-05 NOTE — Assessment & Plan Note (Addendum)
Continue on oxygen with activity to maintain O2 saturations greater than 88 to 90% and at bedtime with CPAP

## 2021-04-05 NOTE — Telephone Encounter (Signed)
Called and spoke with patient to let her know that Dr. Valeta Harms would like for her to be seen today or tomorrow. Patient stated that she is available today and is not scheduled for today with Tammy at 78. Nothing further needed at this time.

## 2021-04-05 NOTE — Assessment & Plan Note (Signed)
Continue on current regimen

## 2021-04-05 NOTE — Progress Notes (Signed)
_0  ID: [name] [name], female    DOB: [birth year], 80 y.o.   MRN: [id number]  Chief Complaint  Patient presents with  . Acute Visit    Referring provider: [name], MD  HPI: 80 year old female former smoker quit in 1971.  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 chronic respiratory failure on oxygen Medical history significant for A. fib, diastolic dysfunction  TEST/EVENTS :    04/05/2021 Acute OV : PF ,Cough  Patient presents for an acute office visit.  She complains of 3 days of increased cough, shortness of breath and  chills.  Appetite has been good.  No nausea vomiting diarrhea.  Patient says cough has been quite severe especially over the last 24 hours.  Causes some stress incontinence.  She denies any hemoptysis chest pain orthopnea PND or increased leg swelling.  She has not checked for COVID-19.  She has been fully vaccinated for COVID-19 x3. Patient has underlying pulmonary fibrosiss, remains on Esbriet She is on oxygen with activity and At bedtime with CPAP .    Allergies  Allergen Reactions  . Ancef [Cefazolin] Hives and Itching  . Pseudoephedrine Hcl     Other reaction(s): palpitations (moderate)  . Ergotamine   . Other Other (See Comments)  . Pentobarbital   . Caffeine Palpitations    Immunization History  Administered Date(s) Administered  . Influenza Split 10/09/2009, 09/19/2010, 09/13/2020  . Influenza Whole 09/23/2008  . Influenza, High Dose Seasonal PF 09/13/2018, 09/27/2019, 09/26/2020  . Influenza,inj,quad, With Preservative 09/04/2017  . Influenza-Unspecified 09/17/2014, 09/09/2016, 09/04/2017  . Moderna Sars-Covid-2 Vaccination 12/06/2019, 01/03/2020, 10/16/2020  . Pneumococcal Conjugate-13 09/29/2014, 10/16/2015  . Pneumococcal Polysaccharide-23 04/06/2009, 10/02/2017  . Pneumococcal-Unspecified 10/01/2014, 10/02/2017  . Td 04/05/1999  . Tdap 04/06/2009  . Zoster  12/02/2008, 09/29/2018, 01/19/2019  . Zoster Recombinat (Shingrix) 09/29/2018, 01/19/2019    Past Medical History:  Diagnosis Date  . Alpha-1-antitrypsin deficiency carrier   . Arthritis    "all over"  . Atherosclerosis of aorta ([place])   . Atrial fibrillation ([place])   . Back pain   . Complication of anesthesia    "I woke up during 2 different procedures" (10/04/2014)  . Diastolic dysfunction   . Frequent UTI   . GERD (gastroesophageal reflux disease)   . Hypertension   . Hypertriglyceridemia   . Insulin resistance   . Long term current use of anticoagulant   . Multiple lung nodules   . Muscular deconditioning   . MVP (mitral valve prolapse)   . Obesity   . OSA on CPAP   . Osteoarthritis   . Osteopenia    unspecified location  . Pacemaker   . PAF (paroxysmal atrial fibrillation) ([place])   . Positive ANA (antinuclear antibody)   . Presbyesophagus   . Presence of permanent cardiac pacemaker   . Primary osteoarthritis of both hands    neck and spine and hips  . Pulmonary fibrosis ([place])    followed by pulmonolgy  . PVC's (premature ventricular contractions)   . Shortness of breath   . 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 Sleep apnea   . Stroke [facility]) 01/2014   denies deficits on 10/04/2014  . Tachy-brady syndrome ([place]) 10/2014  . TIA (transient ischemic attack)   . Vitamin D deficiency     Tobacco History: Social History   Tobacco Use  Smoking Status Former Smoker  . Packs/day: 2.00  . Years:  18.00  . Pack years: 36.00  . Types: Cigarettes  . Quit date: 07/18/1970  . Years since quitting: 50.7  Smokeless Tobacco Never Used  Tobacco Comment   "quit smoking in 1971"   Counseling given: Not Answered Comment: "quit smoking in 1971"   Outpatient Medications Prior to Visit  Medication Sig Dispense Refill  . apixaban (ELIQUIS) 5 MG TABS tablet Take 1 tablet (5 mg total) by mouth 2 (two) times daily. 180 tablet 3  . B  Complex-C (SUPER B COMPLEX PO) Take 1 tablet by mouth daily.    . Biotin 5000 MCG CAPS Take 5,000 mcg by mouth daily.     . Calcium Carbonate-Vit D-Min (CALCIUM 1200 PO) Take 1,200 mg by mouth daily.     . cholecalciferol (VITAMIN D) 1000 UNITS tablet Take 6,000 Units by mouth daily.     Marland Kitchen Co-Enzyme Q10 100 MG CAPS Take 100 mg by mouth daily.     . diazepam (VALIUM) 5 MG tablet TAKE 1 TABLET EACH DAY. (Patient taking differently: Take 5 mg by mouth daily as needed for anxiety. TAKE 1 TABLET EACH DAY.) 30 tablet 0  . doxycycline (VIBRA-TABS) 100 MG tablet Take 1 tablet (100 mg total) by mouth 2 (two) times daily. 14 tablet 0  . fluticasone (FLONASE) 50 MCG/ACT nasal spray Place 2 sprays into both nostrils as needed for allergies or rhinitis.    . folic acid (FOLVITE) 825 MCG tablet Take 400 mcg by mouth daily.    Marland Kitchen glucosamine-chondroitin 500-400 MG tablet Take 1 tablet by mouth 2 (two) times daily.     Marland Kitchen loratadine (CLARITIN) 10 MG tablet Take 10 mg by mouth daily.    Marland Kitchen MAGNESIUM CITRATE PO Take 400 mg by mouth daily. 1 tab daily    . Multiple Vitamins-Minerals (ZINC PO)     . nitroGLYCERIN (NITROSTAT) 0.4 MG SL tablet Place 1 tablet (0.4 mg total) under the tongue every 5 (five) minutes as needed for chest pain. 25 tablet 1  . omeprazole (PRILOSEC) 40 MG capsule Take 1 capsule by mouth daily.    . Pirfenidone (ESBRIET) 267 MG TABS Take 2 tablets (534 mg total) by mouth with breakfast, with lunch, and with evening meal. 180 tablet 11  . Resveratrol 250 MG CAPS Take 250 mg by mouth daily.    . sertraline (ZOLOFT) 100 MG tablet TAKE 1 TABLET EACH DAY. 90 tablet 0  . TURMERIC PO Take 1,000 mg by mouth daily.     No facility-administered medications prior to visit.     Review of Systems:   Constitutional:   No  weight loss, night sweats,  Fevers, chills,  +fatigue, or  lassitude.  HEENT:   No headaches,  Difficulty swallowing,  Tooth/dental problems, or  Sore throat,                No  sneezing, itching, ear ache,  +nasal congestion, post nasal drip,   CV:  No chest pain,  Orthopnea, PND, swelling in lower extremities, anasarca, dizziness, palpitations, syncope.   GI  No heartburn, indigestion, abdominal pain, nausea, vomiting, diarrhea, change in bowel habits, loss of appetite, bloody stools.   Resp:   No chest wall deformity  Skin: no rash or lesions.  GU: no dysuria, change in color of urine, no urgency or frequency.  No flank pain, no hematuria   MS:  No joint pain or swelling.  No decreased range of motion.  No back pain.    Physical Exam  BP 130/80 (BP Location: Left Arm, Patient Position: Sitting, Cuff Size: Large)   Pulse 90   Temp 97.9 F (36.6 C) (Temporal)   Ht _0  (1.702 m)   Wt 274 lb 3.2 oz (124.4 kg)   LMP  (LMP Unknown)   SpO2 96%   BMI 42.95 kg/m   GEN: A/Ox3; pleasant , NAD, well nourished    HEENT:  [place]/AT,    NOSE-clear, THROAT-clear, no lesions, no postnasal drip or exudate noted.   NECK:  Supple w/ fair ROM; no JVD; normal carotid impulses w/o bruits; no thyromegaly or nodules palpated; no lymphadenopathy.    RESP  Few scattered rhonchi no  accessory muscle use, no dullness to percussion  CARD:  RRR, no m/r/g, tr  peripheral edema, pulses intact, no cyanosis or clubbing.  GI:   Soft & nt; nml bowel sounds; no organomegaly or masses detected.   Musco: Warm bil, no deformities or joint swelling noted.   Neuro: alert, no focal deficits noted.    Skin: Warm, no lesions or rashes    Lab Results:  CBC  BMET  BNP No results found for: BNP  ProBNP  Imaging: No results found.    PFT Results Latest Ref Rng & Units 08/10/2020 07/19/2019 02/24/2017  FVC-Pre L 2.57 2.74 2.58  FVC-Predicted Pre % 84 93 85  FVC-Post L - 2.63 2.45  FVC-Predicted Post % - 89 81  Pre FEV1/FVC % % 86 86 82  Post FEV1/FCV % % - 90 87  FEV1-Pre L 2.22 2.36 2.12  FEV1-Predicted Pre % 97 107 93  FEV1-Post L - 2.35 2.14  DLCO uncorrected  ml/min/mmHg 15.83 16.52 16.15  DLCO UNC% % 76 81 59  DLCO corrected ml/min/mmHg 15.34 - 14.93  DLCO COR %Predicted % 73 - 55  DLVA Predicted % 97 104 79  TLC L 4.50 3.97 -  TLC % Predicted % 81 74 -  RV % Predicted % 71 52 -    No results found for: NITRICOXIDE      Assessment & Plan:   Acute bronchitis Acute bronchitis.  Needs to check for COVID-19 infection. Begin empiric antibiotics and low-dose prednisone Mucolytic's. Check chest x-ray Plan  Patient Instructions  Covid 19 testing call if positive .  Chest xray today  Doxycycline 152m  Twice daily  For 7 days  Prednisone 273mdaily for 5 days  Mucinex DM Twice daily  As needed  Cough/congestion .  Tessalon Three times a day  As needed  Cough  Continue on CPAP with Oxygen At bedtime   Oxygen with activity as needed.  Follow up with Dr. RaChase Callers planned this month and As needed   Please contact office for sooner follow up if symptoms do not improve or worsen or seek emergency care         [facility] Currently tolerating Esbriet.  Continue on current regimen.  OSA on CPAP, compliant Continue on current regimen  Chronic respiratory failure with hypoxia (HCC) Continue on oxygen with activity to maintain O2 saturations greater than 88 to 90% and at bedtime with CPAP     TaRexene EdisonNP 04/05/2021

## 2021-04-05 NOTE — Telephone Encounter (Signed)
Tessalon 100 mg TID PRN, 30 tablets with 3 refills.    Called and spoke with patient, advised that I was sending in the [place] to her pharmacy.  She verbalized understanding.  Nothing further needed.

## 2021-04-05 NOTE — Progress Notes (Signed)
Lft normal 3 weeks ago

## 2021-04-07 ENCOUNTER — Telehealth: Payer: Self-pay | Admitting: Internal Medicine

## 2021-04-07 MED ORDER — BENZONATATE 100 MG PO CAPS: 100.0000 mg | ORAL_CAPSULE | Freq: Three times a day (TID) | ORAL | 2 refills | Status: DC | PRN

## 2021-04-07 MED ORDER — PREDNISONE 10 MG PO TABS: ORAL_TABLET | ORAL | 0 refills | Status: DC

## 2021-04-07 NOTE — Telephone Encounter (Signed)
Still coughing p ov 5/5 with no acute change on cxr/ using lots of peppermints to suppress urge to cough rec  Change meprazole 40 mg Take 30- 60 min before your first and last meals of the day until cough is gone  Continue prednisone 20 mg with breakfast daily until better then 10 mg x 5 days and stop For cough >  mucinex dm 1200 mg every 12 hours and supplement with tessalon pearls 100 x 2 every 6 hours as needed  Continue doxycycline 177m twice daily with food until done  Stop peppermint and all cough drops and replace with jolly ranchers or life savers (not the white ones)  Call on 5/9 if not improved as will need re-eval before considering any narcotic containing cough meds

## 2021-04-08 ENCOUNTER — Encounter: Payer: Self-pay | Admitting: *Deleted

## 2021-04-09 ENCOUNTER — Telehealth: Payer: Self-pay | Admitting: Internal Medicine

## 2021-04-09 NOTE — Telephone Encounter (Signed)
error 

## 2021-04-09 NOTE — Telephone Encounter (Signed)
Pt had OV with TP 5/5 and had instructions to take doxycycline bid x1 week and prednisone x5 days and then to also take tessalon tid prn. Pt was also told to do a Covid test. Pt also had a cxr performed after OV.  Pt does have an upcoming appt with [facility] 5/17 with PFT prior.  Primary Pulmonologist: Ramaswamy Last office visit and with whom: 04/05/21 with TP What do we see them for (pulmonary problems): IPF Last OV assessment/plan:  Assessment & Plan:   Acute bronchitis Acute bronchitis.  Needs to check for COVID-19 infection. Begin empiric antibiotics and low-dose prednisone Mucolytic's. Check chest x-ray Plan  Patient Instructions  Covid 19 testing call if positive .  Chest xray today  Doxycycline 166m  Twice daily  For 7 days  Prednisone 270mdaily for 5 days  Mucinex DM Twice daily  As needed  Cough/congestion .  Tessalon Three times a day  As needed  Cough  Continue on CPAP with Oxygen At bedtime   Oxygen with activity as needed.  Follow up with Dr. RaChase Callers planned this month and As needed   Please contact office for sooner follow up if symptoms do not improve or worsen or seek emergency care         [facility] Currently tolerating Esbriet.  Continue on current regimen.  OSA on CPAP, compliant Continue on current regimen  Chronic respiratory failure with hypoxia (HCC) Continue on oxygen with activity to maintain O2 saturations greater than 88 to 90% and at bedtime with CPAP  Was appointment offered to patient (explain)?  Pt wants recommendations   Reason for call: Called and spoke with pt to see if she was still taking the doxy abx that was prescribed and she said that she still has 3 days left. Pt also has either one or two days left of the prednisone.  Asked pt if she has been having to take the tessalon and she said she has been having to use it three times a day every day and said that she needs to have this Rx  refilled.  Pt said that she did call the office over the weekend and spoke with Dr. WeMelvyn Novasho told her to begin to take the omeprazole 30-6037mbefore first and last meals of the day until cough is gone and also was told to take mucinex DM twice daily which pt said that she has been doing. Pt said that she also did stop using the peppermints and cough drops as advised by Dr. WerMelvyn Novasd has started doing the recommendations from him. Also she said that she has been drinking a lot of water.  Pt states that she still is having a lot of problems with the cough and does not feel like it is getting any better and wants to know if there is anything else we could recommend.  Due to [place] since pt did have last OV with TP, sending this to her. Tammy, please advise on this for pt. Thanks!    Allergies  Allergen Reactions  . Ancef [Cefazolin] Hives and Itching  . Pseudoephedrine Hcl     Other reaction(s): palpitations (moderate)  . Ergotamine   . Other Other (See Comments)  . Pentobarbital   . Caffeine Palpitations    Immunization History  Administered Date(s) Administered  . Influenza Split 10/09/2009, 09/19/2010, 09/13/2020  . Influenza Whole 09/23/2008  . Influenza, High Dose Seasonal PF 09/13/2018, 09/27/2019,  09/26/2020  . Influenza,inj,quad, With Preservative 09/04/2017  . Influenza-Unspecified 09/17/2014, 09/09/2016, 09/04/2017  . Moderna Sars-Covid-2 Vaccination 12/06/2019, 01/03/2020, 10/16/2020  . Pneumococcal Conjugate-13 09/29/2014, 10/16/2015  . Pneumococcal Polysaccharide-23 04/06/2009, 10/02/2017  . Pneumococcal-Unspecified 10/01/2014, 10/02/2017  . Td 04/05/1999  . Tdap 04/06/2009  . Zoster 12/02/2008, 09/29/2018, 01/19/2019  . Zoster Recombinat (Shingrix) 09/29/2018, 01/19/2019

## 2021-04-09 NOTE — Telephone Encounter (Signed)
COVID-19 testing was negative. Patient says she still has ongoing cough and congestion. Is on day 5 of 7 of antibiotics and a current prednisone burst. O2 saturations have been greater than 90% on room air.  Called and discussed with patient.  Patient would like to come in tomorrow for a check.  Please place her on my schedule in an open spot for tomorrow for a follow-up visit  Please contact office for sooner follow up if symptoms do not improve or worsen or seek emergency care

## 2021-04-09 NOTE — Telephone Encounter (Signed)
I have called and spoken with patient and schedule an appt with TP for tomorrow 04/10/21 at 230pm. Patient verbalized understanding, nothing further needed.

## 2021-04-10 ENCOUNTER — Encounter: Payer: Self-pay | Admitting: Adult Health

## 2021-04-10 ENCOUNTER — Other Ambulatory Visit: Payer: Self-pay

## 2021-04-10 ENCOUNTER — Ambulatory Visit (INDEPENDENT_AMBULATORY_CARE_PROVIDER_SITE_OTHER): Payer: Medicare Other | Admitting: Adult Health

## 2021-04-10 VITALS — BP 116/64 | HR 92 | Temp 97.6°F | Ht 67.0 in | Wt 272.4 lb

## 2021-04-10 DIAGNOSIS — J8410 Pulmonary fibrosis, unspecified: Secondary | ICD-10-CM

## 2021-04-10 DIAGNOSIS — J209 Acute bronchitis, unspecified: Secondary | ICD-10-CM | POA: Diagnosis not present

## 2021-04-10 DIAGNOSIS — J9611 Chronic respiratory failure with hypoxia: Secondary | ICD-10-CM

## 2021-04-10 MED ORDER — BENZONATATE 100 MG PO CAPS: 100.0000 mg | ORAL_CAPSULE | Freq: Three times a day (TID) | ORAL | 2 refills | Status: DC | PRN

## 2021-04-10 MED ORDER — DOXYCYCLINE HYCLATE 100 MG PO TABS: 100.0000 mg | ORAL_TABLET | Freq: Two times a day (BID) | ORAL | 0 refills | Status: DC

## 2021-04-10 MED ORDER — ALBUTEROL SULFATE HFA 108 (90 BASE) MCG/ACT IN AERS: 1.0000 | INHALATION_SPRAY | Freq: Four times a day (QID) | RESPIRATORY_TRACT | 1 refills | Status: DC | PRN

## 2021-04-10 NOTE — Assessment & Plan Note (Signed)
Cont on O2 to keep sats >88-90%

## 2021-04-10 NOTE — Assessment & Plan Note (Signed)
Slow to resolve flare - CXR few days ago with no acute process  Add flutter valve , extend abx and steroid taper   Plan  Patient Instructions  Extend Doxycycline for additional 3 days .  Mucinex DM Twice daily   Taper Prednisone 22m daily for 3 days , then 265mdaily for 3 days and 1065maily for 3 days and then stop .  Add Flutter valve Three times a day   Albuterol Inhaler 1-2 puffs every 6hrs as needed .  Continue on Oxygen 2l/m.  Follow up next week as planned and As needed   Please contact office for sooner follow up if symptoms do not improve or worsen or seek emergency care

## 2021-04-10 NOTE — Progress Notes (Signed)
_0  ID: [name] [name], female    DOB: Nov 10, 1941, 80 y.o.   MRN: [id number]  Chief Complaint  Patient presents with  . Follow-up    Referring provider: [name], MD  HPI: 80 year old female former smoker quit in 1971.  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  Small pulmonary nodules measuring 5 mm or less stable dating back to 2018 consistent with a benign etiology.  04/10/2021 follow-up; Bronchitis, Pulmonary fibrosis, O2 dependent respiratory failure Patient returns for a follow-up visit, patient was seen last week for cough, congestion, shortness of breath and chills.  COVID-19 testing was negative.  Patient had been fully vaccinated for COVID-19.  She had no increased oxygen demands.  Patient was given doxycycline for 7 days and prednisone 20 mg for 5 days. Patient complains ongoing cough and congestion , mucus remains green on/off. No fever.  Eating well , no n/v.d. No hemoptysis , orthopnea or edema.  Called in over the weekend and was called in steroid burst. Currently on Prednisone 34m daily .  Chest xray showed chronic changes with no acute process.  [facility] [place]   Allergies  Allergen Reactions  . Ancef [Cefazolin] Hives and Itching  . Pseudoephedrine Hcl     Other reaction(s): palpitations (moderate)  . Ergotamine   . Other Other (See Comments)  . Pentobarbital   . Caffeine Palpitations    Immunization History  Administered Date(s) Administered  . Influenza Split 10/09/2009, 09/19/2010, 09/13/2020  . Influenza Whole 09/23/2008  . Influenza, High Dose Seasonal PF 09/13/2018, 09/27/2019, 09/26/2020  . Influenza,inj,quad,  With Preservative 09/04/2017  . Influenza-Unspecified 09/17/2014, 09/09/2016, 09/04/2017  . Moderna Sars-Covid-2 Vaccination 12/06/2019, 01/03/2020, 10/16/2020  . Pneumococcal Conjugate-13 09/29/2014, 10/16/2015  . Pneumococcal Polysaccharide-23 04/06/2009, 10/02/2017  . Pneumococcal-Unspecified 10/01/2014, 10/02/2017  . Td 04/05/1999  . Tdap 04/06/2009  . Zoster 12/02/2008, 09/29/2018, 01/19/2019  . Zoster Recombinat (Shingrix) 09/29/2018, 01/19/2019    Past Medical History:  Diagnosis Date  . Alpha-1-antitrypsin deficiency carrier   . Arthritis    "all over"  . Atherosclerosis of aorta ([facility]   . Atrial fibrillation ([facility]   . Back pain   . Complication of anesthesia    "I woke up during 2 different procedures" (10/04/2014)  . Diastolic dysfunction   . Frequent UTI   . GERD (gastroesophageal reflux disease)   . Hypertension   . Hypertriglyceridemia   . Insulin resistance   . Long term current use of anticoagulant   . Multiple lung nodules   . Muscular deconditioning   . MVP (mitral valve prolapse)   . Obesity   . OSA on CPAP   . Osteoarthritis   . Osteopenia    unspecified location  . Pacemaker   . PAF (paroxysmal atrial fibrillation) ([facility]   . Positive ANA (antinuclear antibody)   . Presbyesophagus   . Presence of permanent cardiac pacemaker   . Primary osteoarthritis of both hands    neck and spine and hips  . Pulmonary fibrosis ([place]    followed by pulmonolgy  . PVC's (premature ventricular contractions)   . Shortness of breath   . Sinus arrest 10/2014   s/p Medtronic Advisa model [id number] number [id number]  . Sleep apnea   .  Stroke [facility]) 01/2014   denies deficits on 10/04/2014  . Tachy-brady syndrome ([place]) 10/2014  . TIA (transient ischemic attack)   . Vitamin D deficiency     Tobacco History: Social History   Tobacco Use  Smoking Status Former Smoker  . Packs/day: 2.00  . Years: 18.00  . Pack years: 36.00  . Types: Cigarettes  . Quit date:  07/18/1970  . Years since quitting: 50.7  Smokeless Tobacco Never Used  Tobacco Comment   "quit smoking in 1971"   Counseling given: Not Answered Comment: "quit smoking in 1971"   Outpatient Medications Prior to Visit  Medication Sig Dispense Refill  . apixaban (ELIQUIS) 5 MG TABS tablet Take 1 tablet (5 mg total) by mouth 2 (two) times daily. 180 tablet 3  . B Complex-C (SUPER B COMPLEX PO) Take 1 tablet by mouth daily.    . Biotin 5000 MCG CAPS Take 5,000 mcg by mouth daily.     . Calcium Carbonate-Vit D-Min (CALCIUM 1200 PO) Take 1,200 mg by mouth daily.     . cholecalciferol (VITAMIN D) 1000 UNITS tablet Take 6,000 Units by mouth daily.     Marland Kitchen Co-Enzyme Q10 100 MG CAPS Take 100 mg by mouth daily.     . diazepam (VALIUM) 5 MG tablet TAKE 1 TABLET EACH DAY. (Patient taking differently: Take 5 mg by mouth daily as needed for anxiety. TAKE 1 TABLET EACH DAY.) 30 tablet 0  . doxycycline (VIBRA-TABS) 100 MG tablet Take 1 tablet (100 mg total) by mouth 2 (two) times daily. 14 tablet 0  . fluticasone (FLONASE) 50 MCG/ACT nasal spray Place 2 sprays into both nostrils as needed for allergies or rhinitis.    . folic acid (FOLVITE) 993 MCG tablet Take 400 mcg by mouth daily.    Marland Kitchen glucosamine-chondroitin 500-400 MG tablet Take 1 tablet by mouth 2 (two) times daily.     Marland Kitchen loratadine (CLARITIN) 10 MG tablet Take 10 mg by mouth daily.    Marland Kitchen MAGNESIUM CITRATE PO Take 400 mg by mouth daily. 1 tab daily    . Multiple Vitamins-Minerals (ZINC PO)     . nitroGLYCERIN (NITROSTAT) 0.4 MG SL tablet Place 1 tablet (0.4 mg total) under the tongue every 5 (five) minutes as needed for chest pain. 25 tablet 1  . omeprazole (PRILOSEC) 40 MG capsule Take 1 capsule by mouth daily.    . predniSONE (DELTASONE) 10 MG tablet Two daily until better then 1 daily x 5 days and stop 30 tablet 0  . predniSONE (DELTASONE) 20 MG tablet Take 1 tablet (20 mg total) by mouth daily with breakfast. 5 tablet 0  . Resveratrol 250 MG  CAPS Take 250 mg by mouth daily.    . sertraline (ZOLOFT) 100 MG tablet TAKE 1 TABLET EACH DAY. 90 tablet 0  . TURMERIC PO Take 1,000 mg by mouth daily.    . benzonatate (TESSALON PERLES) 100 MG capsule Take 1 capsule (100 mg total) by mouth 3 (three) times daily as needed for cough. 30 capsule 2  . Pirfenidone (ESBRIET) 267 MG TABS Take 2 tablets (534 mg total) by mouth with breakfast, with lunch, and with evening meal. (Patient not taking: Reported on 04/10/2021) 180 tablet 11  . doxycycline (VIBRA-TABS) 100 MG tablet Take 1 tablet (100 mg total) by mouth 2 (two) times daily. 14 tablet 0   No facility-administered medications prior to visit.     Review of Systems:   Constitutional:   No  weight loss, night  sweats,  Fevers, chills,  +fatigue, or  lassitude.  HEENT:   No headaches,  Difficulty swallowing,  Tooth/dental problems, or  Sore throat,                No sneezing, itching, ear ache,  +nasal congestion, post nasal drip,   CV:  No chest pain,  Orthopnea, PND, swelling in lower extremities, anasarca, dizziness, palpitations, syncope.   GI  No heartburn, indigestion, abdominal pain, nausea, vomiting, diarrhea, change in bowel habits, loss of appetite, bloody stools.   Resp:    No chest wall deformity  Skin: no rash or lesions.  GU: no dysuria, change in color of urine, no urgency or frequency.  No flank pain, no hematuria   MS:  No joint pain or swelling.  No decreased range of motion.  No back pain.    Physical Exam  BP 116/64 (BP Location: Left Arm, Patient Position: Sitting, Cuff Size: Normal)   Pulse 92   Temp 97.6 F (36.4 C) (Temporal)   Ht _0  (1.702 m)   Wt 272 lb 6.4 oz (123.6 kg)   LMP  (LMP Unknown)   SpO2 95% Comment: 2L  BMI 42.66 kg/m   GEN: A/Ox3; pleasant , NAD, walker , O2    HEENT:  [place]/AT,   NOSE-clear, THROAT-clear, no lesions, no postnasal drip or exudate noted.   NECK:  Supple w/ fair ROM; no JVD; normal carotid impulses w/o bruits; no  thyromegaly or nodules palpated; no lymphadenopathy.    RESP  Few trace rhonchi , no accessory muscle use, no dullness to percussion  CARD:  RRR, no m/r/g, no peripheral edema, pulses intact, no cyanosis or clubbing.  GI:   Soft & nt; nml bowel sounds; no organomegaly or masses detected.   Musco: Warm bil, no deformities or joint swelling noted.   Neuro: alert, no focal deficits noted.    Skin: Warm, no lesions or rashes    Lab Results:  CBC  BMET  No results found for: BNP  Imaging: DG Chest 2 View  Result Date: 04/05/2021 CLINICAL DATA:  Cough, shortness of breath, chills, history atrial fibrillation, hypertension, diabetes mellitus, stroke EXAM: CHEST - 2 VIEW COMPARISON:  09/21/2018 FINDINGS: LEFT subclavian sequential transvenous pacemaker leads project at RIGHT atrium and RIGHT ventricle. Normal heart size, mediastinal contours, and pulmonary vascularity. Chronic bronchitic changes with peripheral cystic changes in the lungs bilaterally, chronic, greatest at RIGHT upper lobe and LEFT lung base. No segmental consolidation, pleural effusion, or pneumothorax. Bones appear demineralized. IMPRESSION: Chronic bronchitic and interstitial lung disease changes. No acute abnormalities. Electronically Signed   By: Lavonia Dana M.D.   On: 04/05/2021 13:49      PFT Results Latest Ref Rng & Units 08/10/2020 07/19/2019 02/24/2017  FVC-Pre L 2.57 2.74 2.58  FVC-Predicted Pre % 84 93 85  FVC-Post L - 2.63 2.45  FVC-Predicted Post % - 89 81  Pre FEV1/FVC % % 86 86 82  Post FEV1/FCV % % - 90 87  FEV1-Pre L 2.22 2.36 2.12  FEV1-Predicted Pre % 97 107 93  FEV1-Post L - 2.35 2.14  DLCO uncorrected ml/min/mmHg 15.83 16.52 16.15  DLCO UNC% % 76 81 59  DLCO corrected ml/min/mmHg 15.34 - 14.93  DLCO COR %Predicted % 73 - 55  DLVA Predicted % 97 104 79  TLC L 4.50 3.97 -  TLC % Predicted % 81 74 -  RV % Predicted % 71 52 -    No results found for: NITRICOXIDE  Assessment & Plan:    Acute bronchitis Slow to resolve flare - CXR few days ago with no acute process  Add flutter valve , extend abx and steroid taper   Plan  Patient Instructions  Extend Doxycycline for additional 3 days .  Mucinex DM Twice daily   Taper Prednisone 54m daily for 3 days , then 242mdaily for 3 days and 1020maily for 3 days and then stop .  Add Flutter valve Three times a day   Albuterol Inhaler 1-2 puffs every 6hrs as needed .  Continue on Oxygen 2l/m.  Follow up next week as planned and As needed   Please contact office for sooner follow up if symptoms do not improve or worsen or seek emergency care           Pulmonary fibrosis ([facility] Pulmonol[facility] Mild flare -tx w/ short steoid taper.   Plan  Patient Instructions  Extend Doxycycline for additional 3 days .  Mucinex DM Twice daily   Taper Prednisone 22m84mily for 3 days , then 20mg82mly for 3 days and 10mg 9my for 3 days and then stop .  Add Flutter valve Three times a day   Albuterol Inhaler 1-2 puffs every 6hrs as needed .  Continue on Oxygen 2l/m.  Follow up next week as planned and As needed   Please contact office for sooner follow up if symptoms do not improve or worsen or seek emergency care       '   Chronic respiratory [place] with hypoxia [place] sats >88-90%      Elley Harp Rexene Edison/09/2021

## 2021-04-10 NOTE — Assessment & Plan Note (Signed)
Mild flare -tx w/ short steoid taper.   Plan  Patient Instructions  Extend Doxycycline for additional 3 days .  Mucinex DM Twice daily   Taper Prednisone 39m daily for 3 days , then 269mdaily for 3 days and 1030maily for 3 days and then stop .  Add Flutter valve Three times a day   Albuterol Inhaler 1-2 puffs every 6hrs as needed .  Continue on Oxygen 2l/m.  Follow up next week as planned and As needed   Please contact office for sooner follow up if symptoms do not improve or worsen or seek emergency care       '

## 2021-04-10 NOTE — Patient Instructions (Addendum)
Extend Doxycycline for additional 3 days .  Mucinex DM Twice daily   Taper Prednisone 76m daily for 3 days , then 260mdaily for 3 days and 1054maily for 3 days and then stop .  Add Flutter valve Three times a day   Albuterol Inhaler 1-2 puffs every 6hrs as needed .  Continue on Oxygen 2l/m.  Follow up next week as planned and As needed   Please contact office for sooner follow up if symptoms do not improve or worsen or seek emergency care

## 2021-04-11 ENCOUNTER — Ambulatory Visit (INDEPENDENT_AMBULATORY_CARE_PROVIDER_SITE_OTHER): Payer: Medicare Other

## 2021-04-11 DIAGNOSIS — I495 Sick sinus syndrome: Secondary | ICD-10-CM | POA: Diagnosis not present

## 2021-04-12 ENCOUNTER — Telehealth: Payer: Self-pay | Admitting: Cardiovascular Disease

## 2021-04-12 LAB — CUP PACEART REMOTE DEVICE CHECK
Battery Remaining Longevity: 48 mo
Battery Voltage: 2.97 V
Brady Statistic AP VP Percent: 0.04 %
Brady Statistic AP VS Percent: 57.15 %
Brady Statistic AS VP Percent: 2.01 %
Brady Statistic AS VS Percent: 40.8 %
Brady Statistic RA Percent Paced: 49.98 %
Brady Statistic RV Percent Paced: 2.13 %
Date Time Interrogation Session: 20220512095352
Implantable Lead Implant Date: 20151103
Implantable Lead Implant Date: 20151103
Implantable Lead Location: 753859
Implantable Lead Location: 753860
Implantable Lead Model: 5076
Implantable Lead Model: 5076
Implantable Pulse Generator Implant Date: 20151103
Lead Channel Impedance Value: 399 Ohm
Lead Channel Impedance Value: 437 Ohm
Lead Channel Impedance Value: 570 Ohm
Lead Channel Impedance Value: 608 Ohm
Lead Channel Pacing Threshold Amplitude: 0.375 V
Lead Channel Pacing Threshold Amplitude: 0.5 V
Lead Channel Pacing Threshold Pulse Width: 0.4 ms
Lead Channel Pacing Threshold Pulse Width: 0.4 ms
Lead Channel Sensing Intrinsic Amplitude: 10.125 mV
Lead Channel Sensing Intrinsic Amplitude: 10.125 mV
Lead Channel Sensing Intrinsic Amplitude: 3.875 mV
Lead Channel Sensing Intrinsic Amplitude: 3.875 mV
Lead Channel Setting Pacing Amplitude: 1.5 V
Lead Channel Setting Pacing Amplitude: 2 V
Lead Channel Setting Pacing Pulse Width: 0.4 ms
Lead Channel Setting Sensing Sensitivity: 2.8 mV

## 2021-04-12 NOTE — Telephone Encounter (Signed)
    Patient calling the office for samples of medication:   1.  What medication and dosage are you requesting samples for? Eliquis   2.  Are you currently out of this medication? Yes  Pt requesting Eliquis samples

## 2021-04-12 NOTE — Telephone Encounter (Signed)
Samples of Eliquis were left at the front desk for the patient, quantity 3 Boxes, Lot Number [id number] EXP: MAY 2024  Spoke with patient, made patient aware that samples are at front desk for pick up. Advised patient to call back to office with any issues, questions, or concerns. Patient verbalized understanding.

## 2021-04-13 ENCOUNTER — Other Ambulatory Visit (HOSPITAL_COMMUNITY): Payer: Medicare Other

## 2021-04-16 ENCOUNTER — Other Ambulatory Visit: Payer: Self-pay

## 2021-04-16 DIAGNOSIS — R7303 Prediabetes: Secondary | ICD-10-CM

## 2021-04-16 DIAGNOSIS — I480 Paroxysmal atrial fibrillation: Secondary | ICD-10-CM

## 2021-04-16 DIAGNOSIS — J8410 Pulmonary fibrosis, unspecified: Secondary | ICD-10-CM

## 2021-04-16 DIAGNOSIS — E785 Hyperlipidemia, unspecified: Secondary | ICD-10-CM

## 2021-04-17 ENCOUNTER — Ambulatory Visit (INDEPENDENT_AMBULATORY_CARE_PROVIDER_SITE_OTHER): Payer: Medicare Other | Admitting: Primary Care

## 2021-04-17 ENCOUNTER — Other Ambulatory Visit: Payer: Self-pay

## 2021-04-17 ENCOUNTER — Encounter: Payer: Self-pay | Admitting: Primary Care

## 2021-04-17 ENCOUNTER — Ambulatory Visit: Payer: Medicare Other | Admitting: Internal Medicine

## 2021-04-17 VITALS — BP 132/74 | HR 99 | Temp 97.5°F | Ht 67.0 in | Wt 277.8 lb

## 2021-04-17 DIAGNOSIS — J209 Acute bronchitis, unspecified: Secondary | ICD-10-CM

## 2021-04-17 DIAGNOSIS — R0602 Shortness of breath: Secondary | ICD-10-CM | POA: Diagnosis not present

## 2021-04-17 DIAGNOSIS — J8410 Pulmonary fibrosis, unspecified: Secondary | ICD-10-CM

## 2021-04-17 LAB — CBC WITH DIFFERENTIAL/PLATELET
Basophils Absolute: 0.1 10*3/uL (ref 0.0–0.1)
Basophils Relative: 0.6 % (ref 0.0–3.0)
Eosinophils Absolute: 0.1 10*3/uL (ref 0.0–0.7)
Eosinophils Relative: 0.5 % (ref 0.0–5.0)
HCT: 44.6 % (ref 36.0–46.0)
Hemoglobin: 14.7 g/dL (ref 12.0–15.0)
Lymphocytes Relative: 13.6 % (ref 12.0–46.0)
Lymphs Abs: 2.7 10*3/uL (ref 0.7–4.0)
MCHC: 32.9 g/dL (ref 30.0–36.0)
MCV: 85.5 fl (ref 78.0–100.0)
Monocytes Absolute: 1.3 10*3/uL — ABNORMAL HIGH (ref 0.1–1.0)
Monocytes Relative: 6.4 % (ref 3.0–12.0)
Neutro Abs: 15.9 10*3/uL — ABNORMAL HIGH (ref 1.4–7.7)
Neutrophils Relative %: 78.9 % — ABNORMAL HIGH (ref 43.0–77.0)
Platelets: 267 10*3/uL (ref 150.0–400.0)
RBC: 5.22 Mil/uL — ABNORMAL HIGH (ref 3.87–5.11)
RDW: 13.6 % (ref 11.5–15.5)
WBC: 20.1 10*3/uL (ref 4.0–10.5)

## 2021-04-17 LAB — COMPREHENSIVE METABOLIC PANEL
ALT: 40 U/L — ABNORMAL HIGH (ref 0–35)
AST: 26 U/L (ref 0–37)
Albumin: 3.6 g/dL (ref 3.5–5.2)
Alkaline Phosphatase: 84 U/L (ref 39–117)
BUN: 25 mg/dL — ABNORMAL HIGH (ref 6–23)
CO2: 29 mEq/L (ref 19–32)
Calcium: 9.1 mg/dL (ref 8.4–10.5)
Chloride: 100 mEq/L (ref 96–112)
Creatinine, Ser: 1.32 mg/dL — ABNORMAL HIGH (ref 0.40–1.20)
GFR: 38.14 mL/min — ABNORMAL LOW (ref >60.00)
Glucose, Bld: 100 mg/dL — ABNORMAL HIGH (ref 70–99)
Potassium: 4.9 mEq/L (ref 3.5–5.1)
Sodium: 135 mEq/L (ref 135–145)
Total Bilirubin: 0.3 mg/dL (ref 0.2–1.2)
Total Protein: 6.8 g/dL (ref 6.0–8.3)

## 2021-04-17 LAB — BRAIN NATRIURETIC PEPTIDE: Pro B Natriuretic peptide (BNP): 289 pg/mL — ABNORMAL HIGH (ref 0.0–100.0)

## 2021-04-17 NOTE — Assessment & Plan Note (Signed)
-  Stopped anti-fibrotic last week d/t acute illness - Recommend resuming Esbriet 2 tabs TID (no plan to further increase) - She needs to reschedule PFTs, if decline in lung function would get repeat HRCT

## 2021-04-17 NOTE — Progress Notes (Addendum)
_0  ID: [name] [name], female    DOB: [birth year], 80 y.o.   MRN: [id number]  Chief Complaint  Patient presents with  . Follow-up    Reports no improvement in symptoms.     Referring provider: [name], MD  HPI: 80 year old female former smoker quit in 1971.  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 Patient of Dr. Chase Caller, last seen by pulmonary NP on 04/10/21.   Previous [facility] pulmonary encounter: 04/10/2021 follow-up; Bronchitis, Pulmonary fibrosis Patient returns for a follow-up visit, patient was seen last week for cough, congestion, shortness of breath and chills.  COVID-19 testing was negative.  Patient had been fully vaccinated for COVID-19.  She had no increased oxygen demands.  Patient was given doxycycline for 7 days and prednisone 20 mg for 5 days. Patient complains ongoing cough and congestion , mucus remains green on/off. No fever.  Eating well , no n/v.d. No hemoptysis , orthopnea or edema.  Called in over the weekend and was called in steroid burst. Currently on Prednisone 63m daily .  Chest xray showed chronic changes with no acute process.  Lives at [facility], drives.   04/17/2021- Interim hx  Patient presents today follow-up with PFTs. She did not have PFTs done today because she is still not feeling well. She completed course of doxycycline and has two days left of prednisone. Reports "cold symptoms" described as sinus drainage and cough. Sputum has not real purulence. Energy level is low. States that she feels her head is not right. She is using Flonase ans Claritin. She is short of breath. She completed doxycycline twice with no improvement. She is taking mucinex twice a day. She has not used albuterol rescue inhaler. Her ex husband died two weeks ago, she found out two days ago. Denies depressed feelings.    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  Small pulmonary nodules measuring 5 mm or less stable dating back to 2018 consistent with a benign etiology.   Allergies  Allergen Reactions  . Ancef [Cefazolin] Hives and Itching  . Pseudoephedrine Hcl     Other reaction(s): palpitations (moderate)  . Ergotamine   . Other Other (See Comments)  . Pentobarbital   . Caffeine Palpitations    Immunization History  Administered Date(s) Administered  . Influenza Split 10/09/2009, 09/19/2010, 09/13/2020  . Influenza Whole 09/23/2008  . Influenza, High Dose Seasonal PF 09/13/2018, 09/27/2019, 09/26/2020  . Influenza,inj,quad, With Preservative 09/04/2017  . Influenza-Unspecified 09/17/2014, 09/09/2016, 09/04/2017  . Moderna Sars-Covid-2 Vaccination 12/06/2019, 01/03/2020, 10/16/2020  . Pneumococcal Conjugate-13 09/29/2014, 10/16/2015  . Pneumococcal Polysaccharide-23 04/06/2009, 10/02/2017  . Pneumococcal-Unspecified 10/01/2014, 10/02/2017  . Td 04/05/1999  . Tdap 04/06/2009  . Zoster 12/02/2008, 09/29/2018, 01/19/2019  . Zoster Recombinat (Shingrix) 09/29/2018, 01/19/2019    Past Medical History:  Diagnosis Date  . Alpha-1-antitrypsin deficiency carrier   . Arthritis    "all over"  . [place] ([place]   . Atrial fibrillation ([place]   . Back pain   . Complication of anesthesia    "I woke up during 2 different procedures" (10/04/2014)  . Diastolic dysfunction   . Frequent UTI   . GERD (gastroesophageal reflux disease)   . Hypertension   . Hypertriglyceridemia   . Insulin resistance   . Long term current use of anticoagulant   . Multiple lung  nodules   . Muscular deconditioning   . MVP (mitral valve prolapse)   . Obesity   . OSA on CPAP   . Osteoarthritis   . Osteopenia    unspecified location  . Pacemaker   . PAF (paroxysmal atrial fibrillation) ([place])   . Positive ANA (antinuclear antibody)   . Presbyesophagus   .  Presence of permanent cardiac pacemaker   . Primary osteoarthritis of both hands    neck and spine and hips  . Pulmonary fibrosis ([place])    followed by pulmonolgy  . PVC's (premature ventricular contractions)   . Shortness of breath   . 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 Sleep apnea   . Stroke [facility]) 01/2014   denies deficits on 10/04/2014  . Tachy-brady syndrome ([place]) 10/2014  . TIA (transient ischemic attack)   . Vitamin D deficiency     Tobacco History: Social History   Tobacco Use  Smoking Status Former Smoker  . Packs/day: 2.00  . Years: 18.00  . Pack years: 36.00  . Types: Cigarettes  . Quit date: 07/18/1970  . Years since quitting: 50.7  Smokeless Tobacco Never Used  Tobacco Comment   "quit smoking in 1971"   Counseling given: Not Answered Comment: "quit smoking in 1971"   Outpatient Medications Prior to Visit  Medication Sig Dispense Refill  . albuterol (VENTOLIN HFA) 108 (90 Base) MCG/ACT inhaler Inhale 1-2 puffs into the lungs every 6 (six) hours as needed for wheezing or shortness of breath. 8 g 1  . apixaban (ELIQUIS) 5 MG TABS tablet Take 1 tablet (5 mg total) by mouth 2 (two) times daily. 180 tablet 3  . B Complex-C (SUPER B COMPLEX PO) Take 1 tablet by mouth daily.    . benzonatate (TESSALON PERLES) 100 MG capsule Take 1 capsule (100 mg total) by mouth 3 (three) times daily as needed for cough. 30 capsule 2  . Biotin 5000 MCG CAPS Take 5,000 mcg by mouth daily.     . Calcium Carbonate-Vit D-Min (CALCIUM 1200 PO) Take 1,200 mg by mouth daily.     . cholecalciferol (VITAMIN D) 1000 UNITS tablet Take 6,000 Units by mouth daily.     Marland Kitchen Co-Enzyme Q10 100 MG CAPS Take 100 mg by mouth daily.     . diazepam (VALIUM) 5 MG tablet TAKE 1 TABLET EACH DAY. (Patient taking differently: Take 5 mg by mouth daily as needed for anxiety. TAKE 1 TABLET EACH DAY.) 30 tablet 0  . doxycycline (VIBRA-TABS) 100 MG tablet Take 1 tablet  (100 mg total) by mouth 2 (two) times daily. 14 tablet 0  . doxycycline (VIBRA-TABS) 100 MG tablet Take 1 tablet (100 mg total) by mouth 2 (two) times daily. 6 tablet 0  . fluticasone (FLONASE) 50 MCG/ACT nasal spray Place 2 sprays into both nostrils as needed for allergies or rhinitis.    . folic acid (FOLVITE) 401 MCG tablet Take 400 mcg by mouth daily.    Marland Kitchen glucosamine-chondroitin 500-400 MG tablet Take 1 tablet by mouth 2 (two) times daily.     Marland Kitchen loratadine (CLARITIN) 10 MG tablet Take 10 mg by mouth daily.    Marland Kitchen MAGNESIUM CITRATE PO Take 400 mg by mouth daily. 1 tab daily    . Multiple Vitamins-Minerals (ZINC PO)     . nitroGLYCERIN (NITROSTAT) 0.4 MG SL tablet Place 1 tablet (0.4 mg total) under the tongue every 5 (five) minutes as needed for chest pain.  25 tablet 1  . omeprazole (PRILOSEC) 40 MG capsule Take 1 capsule by mouth daily.    . Pirfenidone (ESBRIET) 267 MG TABS Take 2 tablets (534 mg total) by mouth with breakfast, with lunch, and with evening meal. 180 tablet 11  . predniSONE (DELTASONE) 10 MG tablet Two daily until better then 1 daily x 5 days and stop 30 tablet 0  . predniSONE (DELTASONE) 20 MG tablet Take 1 tablet (20 mg total) by mouth daily with breakfast. 5 tablet 0  . Resveratrol 250 MG CAPS Take 250 mg by mouth daily.    . sertraline (ZOLOFT) 100 MG tablet TAKE 1 TABLET EACH DAY. 90 tablet 0  . TURMERIC PO Take 1,000 mg by mouth daily.     No facility-administered medications prior to visit.   Review of Systems  Review of Systems  Constitutional: Positive for fatigue.  HENT: Positive for congestion and postnasal drip.   Respiratory: Positive for cough.   Cardiovascular: Negative.    Physical Exam  BP 132/74 (BP Location: Right Arm, Cuff Size: Normal)   Pulse 99   Temp (!) 97.5 F (36.4 C) (Temporal)   Ht _0  (1.702 m)   Wt 277 lb 12.8 oz (126 kg)   LMP  (LMP Unknown)   SpO2 98% Comment: RA  BMI 43.51 kg/m  Physical Exam Constitutional:       Appearance: Normal appearance.  HENT:     Head: Normocephalic and atraumatic.  Cardiovascular:     Rate and Rhythm: Normal rate. Rhythm irregular.  Pulmonary:     Effort: Pulmonary effort is normal.     Breath sounds: Normal breath sounds.     Comments: CTA Neurological:     General: No focal deficit present.     Mental Status: She is alert and oriented to person, place, and time. Mental status is at baseline.  Psychiatric:        Mood and Affect: Mood normal.        Behavior: Behavior normal.        Thought Content: Thought content normal.        Judgment: Judgment normal.      Lab Results:  CBC    Component Value Date/Time   WBC 11.6 (H) 01/04/2021 1445   WBC 8.5 07/28/2020 0913   RBC 5.11 01/04/2021 1445   RBC 4.93 07/28/2020 0913   HGB 14.8 01/04/2021 1445   HCT 43.3 01/04/2021 1445   PLT 283 01/04/2021 1445   MCV 85 01/04/2021 1445   MCH 29.0 01/04/2021 1445   MCH 29.4 07/28/2020 0913   MCHC 34.2 01/04/2021 1445   MCHC 33.6 07/28/2020 0913   RDW 12.7 01/04/2021 1445   LYMPHSABS 2,618 07/28/2020 0913   LYMPHSABS 2.0 09/17/2017 1131   MONOABS 1.0 10/14/2018 1028   EOSABS 391 07/28/2020 0913   EOSABS 0.2 09/17/2017 1131   BASOSABS 60 07/28/2020 0913   BASOSABS 0.0 09/17/2017 1131    BMET    Component Value Date/Time   NA 141 01/04/2021 1445   K 4.9 01/04/2021 1445   CL 103 01/04/2021 1445   CO2 23 01/04/2021 1445   GLUCOSE 84 01/04/2021 1445   GLUCOSE 111 (H) 07/28/2020 0913   BUN 24 01/04/2021 1445   CREATININE 1.04 (H) 01/04/2021 1445   CREATININE 1.09 (H) 07/28/2020 0913   CALCIUM 9.2 01/04/2021 1445   GFRNONAA 51 (L) 01/04/2021 1445   GFRNONAA 48 (L) 07/28/2020 0913   GFRAA 59 (L) 01/04/2021 1445   GFRAA  56 (L) 07/28/2020 0913    BNP No results found for: BNP  ProBNP    Component Value Date/Time   PROBNP 81.0 07/21/2019 1639    Imaging: DG Chest 2 View  Result Date: 04/05/2021 CLINICAL DATA:  Cough, shortness of breath, chills,  history atrial fibrillation, hypertension, diabetes mellitus, stroke EXAM: CHEST - 2 VIEW COMPARISON:  09/21/2018 FINDINGS: LEFT subclavian sequential transvenous pacemaker leads project at RIGHT atrium and RIGHT ventricle. Normal heart size, mediastinal contours, and pulmonary vascularity. Chronic bronchitic changes with peripheral cystic changes in the lungs bilaterally, chronic, greatest at RIGHT upper lobe and LEFT lung base. No segmental consolidation, pleural effusion, or pneumothorax. Bones appear demineralized. IMPRESSION: Chronic bronchitic and interstitial lung disease changes. No acute abnormalities. Electronically Signed   By: Lavonia Dana M.D.   On: 04/05/2021 13:49   CUP PACEART REMOTE DEVICE CHECK  Result Date: 04/12/2021 Scheduled remote reviewed. Captured rhythm - ongoing AF; onset 04/10/21 @ 10:43AM.  3 AT/AF events.  A burden 16.6%.  Known PAF.  Meds: Eliquis. Normal device function. Forwarding to triage for ongoing AF. RP Next remote 91 days.    Assessment & Plan:   Acute bronchitis - She continues to have vague complaints of not feeling well. No real improvement in congestion or cough after completing an extended course of doxycycline and prednisone taper. She is getting up clear mucus. CXR 5/10 showed no acute process. Her lungs are clear t/o on exam. O2 98% RA. She is compliant Claritin, Flonase and mucinex. Adding saline nasal spray. Checking sputum culture.   Pulmonary fibrosis ([place]), followed by Pulmonolgy - Stopped anti-fibrotic last week d/t acute illness - Recommend resuming Esbriet 2 tabs TID (no plan to further increase) - She needs to reschedule PFTs, if decline in lung function would get repeat HRCT    Martyn Ehrich, NP 04/17/2021

## 2021-04-17 NOTE — Patient Instructions (Addendum)
Recommendations: Continue Claritin 24m daily  Continue flonase nasal spray daily Add saline (ocean) nasal rinses twice a day  Use flutter valve three time a day  Resume ESBRIET  Continue prednisone 142mfor additional 2 days   Orders: Respiratory sputum culture (if able)  Labs today  Needs to reschedule PFTs when feeling better   Follow-up First available with Dr. RaChase Caller

## 2021-04-17 NOTE — Assessment & Plan Note (Addendum)
-  She continues to have vague complaints of not feeling well. No real improvement in congestion or cough after completing an extended course of doxycycline and prednisone taper. She is getting up clear mucus. CXR 5/10 showed no acute process. Her lungs are clear t/o on exam. O2 98% RA. She is compliant Claritin, Flonase and mucinex. Adding saline nasal spray. Checking sputum culture.

## 2021-04-18 ENCOUNTER — Telehealth: Payer: Self-pay | Admitting: *Deleted

## 2021-04-18 ENCOUNTER — Other Ambulatory Visit: Payer: Self-pay | Admitting: *Deleted

## 2021-04-18 ENCOUNTER — Telehealth: Payer: Self-pay | Admitting: Cardiovascular Disease

## 2021-04-18 DIAGNOSIS — R0600 Dyspnea, unspecified: Secondary | ICD-10-CM

## 2021-04-18 DIAGNOSIS — I495 Sick sinus syndrome: Secondary | ICD-10-CM

## 2021-04-18 DIAGNOSIS — R0609 Other forms of dyspnea: Secondary | ICD-10-CM

## 2021-04-18 MED ORDER — DILTIAZEM HCL ER 120 MG PO CP24: 120.0000 mg | ORAL_CAPSULE | Freq: Every day | ORAL | 1 refills | Status: DC

## 2021-04-18 MED ORDER — FUROSEMIDE 40 MG PO TABS: 40.0000 mg | ORAL_TABLET | Freq: Every day | ORAL | 0 refills | Status: DC

## 2021-04-18 MED ORDER — AMOXICILLIN-POT CLAVULANATE 875-125 MG PO TABS: 1.0000 | ORAL_TABLET | Freq: Two times a day (BID) | ORAL | 0 refills | Status: DC

## 2021-04-18 NOTE — Telephone Encounter (Signed)
Returned the call to the patient.   She has been advised to take: Furosemide 40 mg for three days Diltiazem 120 mg once daily  Do a download 04/27/21 Weigh herself daily  She will call us in a few days with an update.

## 2021-04-18 NOTE — Telephone Encounter (Signed)
Manual transmission received.  Presenting Rhythm AF ongoing for almost 8 days.

## 2021-04-18 NOTE — Telephone Encounter (Signed)
Patient to call back to say her BMP was 2.89 her dr told her to call us to let us know. Please advise

## 2021-04-18 NOTE — Telephone Encounter (Signed)
Returned call to patient who states that she had her BNP drawn yesterday and it is elevated at 289. Patient is concerned about fluid buildup, patient reports she has been sick for the last 10 days with a respiratory illness and has been on steroids, and antibiotics. Patient does state she is short of breath but states she also has pulmonary fibrosis. Patient denies any swelling at this time.   Spoke with Lattie Haw- Dr. Lurline Del nurse, patient will do a download from her Pacer now to send to Dr. Loletha Grayer.   Advised patient that we would be back in touch after Dr. Loletha Grayer has reviewed everything. Patient verbalized understanding.

## 2021-04-18 NOTE — Telephone Encounter (Signed)
Thank you for cc: She was in irregular rhythm when I saw her but rate was controlled at visit. Her WBC was elevated, steroid could explain this but she continues to have persistent sinus symptoms. I sent in Augmentin x 10 days. She has one more day of prednisone, after she completes taper she does not need more steriods. I will recheck labs at follow-up. Thank you again for your attention to this   Raquel Sarna please make sure she has a follow-up in the next 2 week, needs repeat CBC with diff, BMET and BNP

## 2021-04-18 NOTE — Progress Notes (Signed)
Please let patient know her WBC was significantly elevated. This could be from steroids but I do think it is solely from that, she may need additional abx for persistent sinusitis symptoms. Please send in Augmentin 1 tab BID x 10 days. Her fluid level was elevated, if she has cardiologist she should follow up with them. If not lets get echocardiogram if she has not had one in the last 2 years.

## 2021-04-18 NOTE — Telephone Encounter (Signed)
That is mildly elevated over her previous baseline of 80-90, but not at all terrible. Steroids can cause fluid retention. We could try furosemide 40 mg daily for 3 days to see if it helps the breathing. Please ask her to weigh today and then daily while taking the diuretic. And please schedule for an echo for dyspnea (last time we checked it was OK).

## 2021-04-18 NOTE — Telephone Encounter (Signed)
Aaah, that explains it. The lengthy episode of AFib (with mediocre rate control) is probably enough to explain a little CHF and the BNP elevation. AFib often accompanies respiratory infections. It is quite likely to resolve as her respiratory issues improve. Please start diltiazem 120 mg daily for better rate control. Please do another download in about 10 days to see if still in AFib.

## 2021-04-18 NOTE — Telephone Encounter (Signed)
Medication samples have been provided to the patient.  Drug name: Eliquis  Qty: 2 boxes  LOT: [id number]  Exp.Date: May 2024  Samples left at front desk for patient pick-up. Patient notified.

## 2021-04-19 NOTE — Telephone Encounter (Signed)
Called and spoke with pt letting her know that Eustaquio Maize stated that we needed to schedule f/u for her to be seen in 2 weeks and have labwork done prior. Pt verbalized understanding. Lab work has been ordered and pt has been scheduled for f/u with [facility] 6/2. Pt told to arrive early for labwork to be done prior to being seen. Nothing further needed.

## 2021-04-19 NOTE — Addendum Note (Signed)
Addended by: Lorretta Harp on: 04/19/2021 01:51 PM   Modules accepted: Orders

## 2021-04-20 NOTE — Telephone Encounter (Signed)
Thanks Ronalee Belts. Will close encounter

## 2021-04-23 ENCOUNTER — Telehealth: Payer: Self-pay | Admitting: Cardiovascular Disease

## 2021-04-23 NOTE — Telephone Encounter (Signed)
Readings:  272.4 Wednesday  273.3 Thursday 272.2 Friday   Per patient monitor was placed on she would like to know if she is back in rhythm. Advised I did not have the sign in to check, but would route to Dr.C's nurse and she could try and check if it would come up.  Patient denies chest pain, shortness of breath

## 2021-04-23 NOTE — Telephone Encounter (Signed)
New message:    Patient calling to report weight for last week. Patient would like to speak with the nurse.

## 2021-04-23 NOTE — Telephone Encounter (Signed)
Yes, indeed. Back in normal rhythm as of today.

## 2021-04-23 NOTE — Telephone Encounter (Signed)
Called patient, advised of message from MD.  Patient verbalized understanding.

## 2021-04-25 ENCOUNTER — Ambulatory Visit: Payer: Medicare Other | Admitting: Internal Medicine

## 2021-05-03 ENCOUNTER — Ambulatory Visit: Payer: Medicare Other | Admitting: Primary Care

## 2021-05-03 ENCOUNTER — Telehealth: Payer: Self-pay | Admitting: Primary Care

## 2021-05-03 NOTE — Telephone Encounter (Signed)
Called and spoke with pt letting her know that Eustaquio Maize would prefer for her to have PFT prior to her OV with MR if she is okay with that and she said that was fine. Pt has been scheduled for PFT 6/6 and wanted to go to pharmacy for covid test due to her age and not able to drive much. Pt has been told that she will need to go get covid test tomorrow 6/3 at pharmacy and that it needs to be PCR test and to also bring results with her to appt. Pt also is aware if we do not have the results back by 6/6 that we will need to cancel the PFT. Nothing further needed.

## 2021-05-03 NOTE — Progress Notes (Signed)
Remote pacemaker transmission.   

## 2021-05-03 NOTE — Telephone Encounter (Signed)
Spoke with patient over the phone, she is feeling better. He took Augmentin and her sinus symptoms have improved. She has an apt in 12 days with Dr. Chase Caller, she would like to cancel today's visit with me. Please see if we can get PFTs scheduled

## 2021-05-03 NOTE — Telephone Encounter (Signed)
Yes I would if patient is agreeable

## 2021-05-03 NOTE — Telephone Encounter (Signed)
It does not work out for pt to have PFT same day as appt with MR. Please advise if we should try to see if we can get PFT scheduled on a different day prior to OV or if this could wait for MR to further discuss with pt and timing at her upcoming OV?

## 2021-05-04 DIAGNOSIS — K219 Gastro-esophageal reflux disease without esophagitis: Secondary | ICD-10-CM | POA: Diagnosis not present

## 2021-05-04 DIAGNOSIS — I480 Paroxysmal atrial fibrillation: Secondary | ICD-10-CM | POA: Diagnosis not present

## 2021-05-04 DIAGNOSIS — Z20822 Contact with and (suspected) exposure to covid-19: Secondary | ICD-10-CM | POA: Diagnosis not present

## 2021-05-04 DIAGNOSIS — I10 Essential (primary) hypertension: Secondary | ICD-10-CM | POA: Diagnosis not present

## 2021-05-04 DIAGNOSIS — M1712 Unilateral primary osteoarthritis, left knee: Secondary | ICD-10-CM | POA: Diagnosis not present

## 2021-05-04 DIAGNOSIS — F329 Major depressive disorder, single episode, unspecified: Secondary | ICD-10-CM | POA: Diagnosis not present

## 2021-05-04 DIAGNOSIS — F331 Major depressive disorder, recurrent, moderate: Secondary | ICD-10-CM | POA: Diagnosis not present

## 2021-05-04 DIAGNOSIS — G459 Transient cerebral ischemic attack, unspecified: Secondary | ICD-10-CM | POA: Diagnosis not present

## 2021-05-07 ENCOUNTER — Ambulatory Visit (INDEPENDENT_AMBULATORY_CARE_PROVIDER_SITE_OTHER): Payer: Medicare Other | Admitting: Internal Medicine

## 2021-05-07 ENCOUNTER — Other Ambulatory Visit: Payer: Self-pay

## 2021-05-07 DIAGNOSIS — J84112 Idiopathic pulmonary fibrosis: Secondary | ICD-10-CM | POA: Diagnosis not present

## 2021-05-07 LAB — PULMONARY FUNCTION TEST
DL/VA % pred: 105 %
DL/VA: 4.19 ml/min/mmHg/L
DLCO cor % pred: 74 %
DLCO cor: 15.5 ml/min/mmHg
DLCO unc % pred: 74 %
DLCO unc: 15.5 ml/min/mmHg
FEF 25-75 Pre: 3.19 L/sec
FEF2575-%Pred-Pre: 201 %
FEV1-%Pred-Pre: 97 %
FEV1-Pre: 2.17 L
FEV1FVC-%Pred-Pre: 116 %
FEV6-%Pred-Pre: 89 %
FEV6-Pre: 2.53 L
FEV6FVC-%Pred-Pre: 105 %
FVC-%Pred-Pre: 84 %
FVC-Pre: 2.53 L
Pre FEV1/FVC ratio: 86 %
Pre FEV6/FVC Ratio: 100 %

## 2021-05-07 NOTE — Progress Notes (Signed)
Spirometry and Dlco done today.

## 2021-05-09 ENCOUNTER — Telehealth: Payer: Self-pay | Admitting: Cardiovascular Disease

## 2021-05-09 NOTE — Telephone Encounter (Signed)
Pt completed manual transmission on 04/23/21.  Presenting Rhythm APVS 80s.  Phone encoutner dated 5/23 reflects MD awareness.

## 2021-05-09 NOTE — Telephone Encounter (Signed)
Patient calling the office for samples of medication:   1.  What medication and dosage are you requesting samples for? [name] [name]  2.  Are you currently out of this medication? Just a few left

## 2021-05-09 NOTE — Telephone Encounter (Signed)
Called and spoke w/pt and stated that we could give a one week supply and offer pt assistance but they stated that they were denied. She had also mentioned possibly going on warfarin. I told her I could route this message to someone who can better assist for a callback. Will route to the pharmd pool

## 2021-05-09 NOTE — Telephone Encounter (Signed)
Pt stated that she would like to send a msg herself so I will wait on the msg

## 2021-05-15 ENCOUNTER — Telehealth: Payer: Self-pay | Admitting: Internal Medicine

## 2021-05-15 ENCOUNTER — Other Ambulatory Visit: Payer: Self-pay

## 2021-05-15 ENCOUNTER — Ambulatory Visit (INDEPENDENT_AMBULATORY_CARE_PROVIDER_SITE_OTHER): Payer: Medicare Other | Admitting: Internal Medicine

## 2021-05-15 DIAGNOSIS — Z79899 Other long term (current) drug therapy: Secondary | ICD-10-CM

## 2021-05-15 DIAGNOSIS — J84112 Idiopathic pulmonary fibrosis: Secondary | ICD-10-CM | POA: Diagnosis not present

## 2021-05-15 NOTE — Patient Instructions (Addendum)
ICD-10-CM   1. IPF (idiopathic pulmonary fibrosis) ([place])  J84.112     2. Encounter for drug therapy  Z79.899        2% decline in breathing test since sept 2021 -> March 2022. Clinically probably not significant  Noted you are on esbriet holiday  Noted night and exertional o2 helping you  LFT normal may 2022  Plan - In 1 week restart pirfenidone as 3 pills x 1  times daily with food and then week later do 3 pills twie daily. This will be your maximum dose for pirfenidone.  We will not escalate further past 6 tabs per day  -We can consider nintedanib in the future if the low-dose pirfenidone also fails  -Currently we are not considering nintedanib because of associated Eliquis intake  -Continue oxygen with exertion and at night  - do LFT check in 3 mon  Follow-up - r eturn to see [name] [name] in 3 months - 30 min slot for IPF - walk test

## 2021-05-15 NOTE — Telephone Encounter (Signed)
Call returned to patient, confirmed DOB. Patient wants to know if it is too hot for her to make her appt. I inquired as to whether she has air conditioning in her car. She states she does have air conditioning in her car. I made her aware if she will not be in heat for a pro-longed period of time I cant see that coming to her appt will be an issue. Patient reports her concern is that after being in the appt for an hour, during that hour her car will be hot and she is concerned she will not be able to breath. Patient wants to know if she should reschedule.   MR please advise. Patient appt is at 4pm.

## 2021-05-15 NOTE — Progress Notes (Signed)
e former smoker followed for dyspnea on exertion, lung nodules, fibrosis, complicated by A. 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 She does not qualify for portable oxygen CT chest 01/06/17-pulmonary fibrosis, small nodules ANA 1:160 PFT 07/19/2019- mild restriction. FVC 2.94/ 93%, FEV1 2.20/ 107%, R .86, FEF25-75% 3.48/ 110%, TLC 74%, DLCO 81%, no resp to BD ------------------------------------------------------------------------------------------  09/21/2018- 80 year old female former smoker followed for dyspnea on exertion, lung nodules, fibrosis, ANA 3:354, complicated by A. 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 Feeling Sob with activity today. Previously completed pulmonary rehab. Body weight 250 lbs No acute changes in her breathing.  Notices a full/bloated feeling especially after meals, but little wheeze or cough.  Still followed by cardiology for A. Fib/pacemaker. Gradually adjusting to loss of daughter who died from end-stage lung disease related to her alpha-1 antitrypsin status. CT chest 04/03/2018- IMPRESSION: 1. Pulmonary nodules are stable to slightly decreased back to 01/06/2017 chest CT, considered benign. 2. 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 No frank honeycombing. No convincing progression since 01/06/2017 high-resolution chest CT study. 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 Usual interstitial pneumonia is unlikely given absence of progression and presence of air trapping. 3. Stable mild patchy air trapping in  both lungs indicative of small airways disease. Aortic Atherosclerosis (ICD10-I70.0).  07/19/2019- 80 year old female former smoker followed for dyspnea on exertion, lung nodules, fibrosis, ANA 5:625, complicated by A. 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 TKR deferred due to Covid. Activity limited to ADLs and not losing weight.  Discussed her CT showing mild stable fibrosis. Also reviewed Echo and PFT. She is anxious, still grieving death of daughter from a1AT resp failure. She presses me to support trial of glutathione because it made her daughter "look better in the face". She wants to see Dr Chase Caller for his expertise re interstitial lung disease, although she is aware of mild chronic stable fibrosis and stable nodules.   PFT 07/19/2019- mild restriction. FVC 2.94/ 93%, FEV1 2.20/ 107%, R .86, FEF25-75% 3.48/ 110%, TLC 74%, DLCO 81%, no resp to BD  ECHO- 05/18/2019-  1. The left ventricle has low normal systolic function, with an ejection fraction of 50-55%. The cavity size was normal. Left ventricular diastolic function could not be evaluated.  2. The right ventricle has normal systolic function. The cavity was normal.  3. The mitral valve is grossly normal.  4. The tricuspid valve is grossly normal.  5. The aortic valve is tricuspid. Mild thickening of the aortic valve. No stenosis of the aortic valve.  6. Normal LV systolic function; no significant valvular heart disease.  CT chest 04/05/2019- IMPRESSION: No acute cardiopulmonary disease. Mild chronic stable fibrotic change.  Several small subcentimeter mostly peripheral pulmonary nodules with the largest measuring 7 mm over the posterolateral right lower lobe. These are unchanged for greater than 2 years and therefore considered benign. Aortic Atherosclerosis (ICD10-I70.0).   OV  07/21/2019  Subjective:  [name], female , DOB: [birth year] , age 4 y.o. , MRN: [id number] ,  ADDRESS: [place] [address]   07/21/2019 -   Chief Complaint  Patient presents with   Follow-up    SOB worsening over past 6 months - increased weight gain     HPI [name] [name] 43 y.o. -is a friend ofl 1 of my pulmonary fibrosis patients.  She is also a patient of Dr. Baird Lyons in our clinic as described above.  She is here to the ILD clinic for second opinion about her pulmonary fibrosis.  She tells me that she was diagnosed with pulmonary fibrosis a few years ago and it has been stable.  Is been of insidious onset.  She had limited autoimmune profile as documented below in 2018 when she had a positive ANA but of low titer.  Her dyspnea is been stable.  She is dealing with other medical issues such as obesity, using a walker because of knee issues and a stroke and also death of her daughter from alpha-1 antitrypsin disease approximately 2 years ago.  She says she has some dyspnea on exertion relieved by rest but overall this is stable.  The dyspnea is definitely significant and out of proportion to the level of ILD as documented by her pulmonary function test which shows stability.  Her CT high-resolution that I was personally visualized from a year ago and his CT chest from 2020 showed bilateral bibasal reticular pattern but no honeycombing or traction bronchiectasis.  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  I do not see any features of alternate diagnosis.  She denies any autoimmune features.  She denies having mold in the house of pet birds.  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       OV 08/10/2020   Subjective:  [name], female , DOB: [birth year], age 61 y.o. years. 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  Indeterminate in 2020 CT scan.  Autoimmune negative except for ANA 1: 160.  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  [Daughter died from alpha-1 CC]  Obesity   Diastolic dysfunction  Atrial fibrillation on Eliquis  Obstructive sleep apnea on CPAP without oxygen   HPI TERRIS BODIN 80 y.o. -last seen just over a year ago.  Since then not seen.  She tells me that she think she might be a little bit worse in terms of her shortness of breath than before.  But at the same time she says she is morbidly obese and has cardiac issues.  She also has shoulder rotator cuff tear issues.  She feels that she might be short of breath because of that.  In terms of ILD symptom questionnaire is documented below.  Symptoms are actually a little bit better than last year.  She had pulmonary function test and this is stable.  She had high-resolution CT chest.  Dr. 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  In terms of her walking desaturation test: desaturated -she desaturated the end of 1 lap.  She required 2 L of nasal  cannula to correct.  She said that after that she started feeling better.  She also reported that when she did physical therapy she felt as exhausted and sleeps a lot.   Noted that she is on polypharmacy has obesity, heart disease and is on Eliquis   OV 09/26/2020   Subjective:  [name], female , DOB: December 07, 1940, age 63 y.o. years. , MRN: [id number],  ADDRESS: [address] [place] [address] PCP  Virgie Dad, MD Providers : Treatment Team:  Attending Provider: Brand Males, MD Patient Care Team: Virgie Dad, MD as PCP - General  (Internal Medicine) Sanda Klein, MD as PCP - Cardiology (Cardiology) Maisie Fus, MD as Consulting Physician (Obstetrics and Gynecology) Richmond Campbell, MD as Consulting Physician (Gastroenterology) Croitoru, Dani Gobble, MD as Consulting Physician (Cardiology) Kathie Rhodes, MD (Inactive) as Consulting Physician (Urology) Danella Sensing, MD as Consulting Physician (Dermatology) Brand Males, MD as Consulting Physician (Pulmonary Disease) Bo Merino, MD as Consulting Physician (Rheumatology) Barbaraann Cao, OD as Referring Physician (Optometry) Berenice Primas, MD as Referring Physician (Orthopedic Surgery)    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  Indeterminate in 01/11/19 CT scan.  Autoimmune negative except for ANA 1: 160.  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  [Daughter died from alpha-1 CC]  Obesity   Diastolic dysfunction  Atrial fibrillation on Eliquis  Obstructive sleep apnea on CPAP without oxygen  Chief Complaint  Patient presents with   Follow-up    ILD, still SOB, cannot get O2 from [employer]       HPI SHLONDA DOLLOFF 80 y.o. -returns for follow-up of IPF.  This visit is to make sure that pirfenidone start has gone well.  She says she is now into 4 weeks of pirfenidone.  She is taking 3 pills 3 times daily for the last 1 week.  So far no GI site issues.  Her baseline symptom profile includes fatigue and it is stable.  Dyspnea stable.  She is frustrated by the lack of having portable oxygen system.  She says the oxygen DME company is yet to provide her.  Apparently my office did not do the right paperwork but she thinks it is the DME  company.  Nevertheless she wants to try another DME company.  She also has some occasional mild intermittent epistaxis by using her oxygen which dries her nose up.  Otherwise overall stable.  She is inquiring about the Covid booster.  She has not yet had a flu shot but will have it today.    OV 11/09/2020   Subjective:  [name], female , DOB: Apr 08, 1941, age 80 y.o. years. , MRN: [id number],  ADDRESS: [address] [place] [address] PCP  [name], MD Providers : Treatment Team:  Attending Provider: Brand Males, MD Patient Care Team: [name], MD as PCP - General (Family Medicine) Sanda Klein, MD as PCP - Cardiology (Cardiology) Maisie Fus, MD as Consulting Physician (Obstetrics and Gynecology) Richmond Campbell, MD as Consulting Physician (Gastroenterology) Croitoru, Dani Gobble, MD as Consulting Physician (Cardiology) Kathie Rhodes, MD (Inactive) as Consulting Physician (Urology) Danella Sensing, MD  as Consulting Physician (Dermatology) Brand Males, MD as Consulting Physician (Pulmonary Disease) Bo Merino, MD as Consulting Physician (Rheumatology) Barbaraann Cao, OD as Referring Physician (Optometry) Berenice Primas, MD as Referring Physician (Orthopedic Surgery)    Chief Complaint  Patient presents with   Follow-up    ILD, doing ok, feels she is forgetting more and not thinking clearly.      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 -returns for follow-up of IPF.  This visit is to ensure continued tolerance of pirfenidone and to address other issues.  She tells me that approximately a week ago because of severe fatigue she stopped the pirfenidone.  After this the fatigue resolved.  She is wondering about future options in her therapy.  We discussed nintedanib but she is on Eliquis and she has a history of congestive heart failure although not necessarily coronary artery disease.  She is a little bit wary about starting nintedanib because of this.  We discussed the other option of restarting the the pirfenidone but going for a submaximal dose of 2 pills 3 times daily with gradual up titration.  She is open to this.  Therefore we will start at 1 pill 3 times daily which she will continue through the end of this year/month and then next month/next day we will start at 2 pills 3 times daily which would be her maximum dose.  She use oxygen with exertion.  The overnight oxygen saturation test has not been done yet.  We do not know why.  It is possible that she might not be answering her phone calls.      OV 01/23/2021   Subjective:  Patient ID: BMarcellina [name] female , DOB: 1May 30, 1942 age 80y.o. years. , MRN: [id number]  [address] [address]P  MKathyrn Lass MD Providers : Treatment Team:  Attending Provider: RBrand Males MD Patient Care Team: MKathyrn Lass MD as PCP - General (Family Medicine) Croitoru, MDani Gobble MD as PCP - Cardiology (Cardiology) NMaisie Fus MD as Consulting Physician (Obstetrics and Gynecology) MRichmond Campbell MD as Consulting Physician (Gastroenterology) Croitoru, MDani Gobble MD as Consulting Physician (Cardiology) OKathie Rhodes MD (Inactive) as Consulting Physician (Urology) JDanella Sensing MD as Consulting  Physician (Dermatology) RBrand Males MD as Consulting Physician (Pulmonary Disease) DBo Merino MD as Consulting Physician (Rheumatology) MBarbaraann Cao OD as Referring Physician (Optometry) GBerenice Primas MD as Referring Physician (Orthopedic Surgery)    Chief Complaint  Patient presents with   Follow-up    SOB with exertion       Follow-up IPF   - sept 09, 2021 update - IPF dx givien: age greater than 64 only trace positive ANAdescription of small hiatal hernia on CT scan, the type  of pattern on the CT scan called probable UIP, autoimmune antibodies negative last year    - last CT July 2021   -last PFT sept 201  - portable o2 since sept 2021  -esbriet start end sept 2021 -> stopped 11/02/20 due to fatigue -> rstar 11/09/2020 -. 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 -returns for follow-up. Last visit she had fatigue with pirfenidone therefore we reduce the total intake of pirfenidone to a max of 6 pills 3 times daily. Visit to third of the optimal dose which is still effective. She is taking 2 pills 3 times daily now since early part of January 2022. She finds this to be quite tolerable without any fatigue. But then she also admitted that she misses the afternoon dose on many/some days. She did have incidental COVID on 12/28/2020. There are no GI symptoms  She is complaining of 3 punctate to 4 punctate lesions on her upper chest. There apparently bedbugs in the place where she lives. There is no rash in the sun exposed areas.     Results for BDUA, MEHLER(MRN [id number] as of 07/21/2019 15:57  Ref. Range 02/24/2017 15:55  Anti Nuclear  Antibody (ANA) Latest Ref Range: NEGATIVE  POS (A)  ANA Pattern 1 Unknown HOMOGENEOUS (A)  ANA Titer 1 Latest Units: titer 1:160 (H)  RA Latex Turbid. Latest Ref Range: <14 IU/mL <14     CT chest high resolution June 2021   CLINICAL DATA:  80year old female with history of interstitial lung disease.   EXAM: CT CHEST WITHOUT CONTRAST   TECHNIQUE: Multidetector CT imaging of the chest was performed following the standard protocol without intravenous contrast. High resolution imaging of the lungs, as well as inspiratory and expiratory imaging, was performed.   COMPARISON:  Chest CT 04/05/2019.   FINDINGS: Cardiovascular: Heart size is normal. There is no significant pericardial fluid, thickening or pericardial calcification. There is aortic atherosclerosis, as well as atherosclerosis of the great vessels of the mediastinum and the coronary arteries, including calcified atherosclerotic plaque in the left circumflex coronary artery.   Mediastinum/Nodes: No pathologically enlarged mediastinal or hilar lymph nodes. Please note that accurate exclusion of hilar adenopathy is limited on noncontrast CT scans. Small hiatal hernia. No axillary lymphadenopathy.   Lungs/Pleura: Multiple small pulmonary nodules are noted throughout the lungs bilaterally, largest of which is in the right lower lobe (axial image 160 of series 3) measuring 7 x 3 mm (mean diameter 5 mm). These are stable in size and number compared to prior examinations dating back to 202-Mar-2018 No other larger more suspicious appearing pulmonary nodules or masses are noted. No acute consolidative airspace disease. No pleural effusions. High-resolution images demonstrates some patchy areas with mild peripheral predominant ground-glass attenuation, septal thickening, some scattered areas of subpleural reticulation, peripheral predominant traction bronchiectasis and bronchiolectasis, and possible (but not definitive) early  honeycombing most evident in the periphery of the left lower lobe. These findings have a definitive craniocaudal gradient and appear mildly progressive compared to the prior examination. Inspiratory and expiratory imaging demonstrates mild air trapping indicative of small airways disease.   Upper Abdomen: Aortic atherosclerosis.  Calcified granuloma in the liver.   Musculoskeletal: There are no aggressive appearing lytic or blastic lesions noted in the visualized portions of the skeleton.   IMPRESSION: 1. The appearance of the lungs is compatible with interstitial lung disease, which appears mildly progressive compared to the prior examination, with a spectrum of findings considered probable usual interstitial pneumonia (UIP) per current ATS guidelines. 2. Small pulmonary nodules measuring 5 mm or less in size, stable dating back to 2018, considered definitively benign. 3. Aortic atherosclerosis, in addition to left circumflex coronary artery disease. Please note that although the presence of coronary artery calcium documents the presence of coronary artery disease, the severity of this disease and any potential stenosis cannot be assessed on this non-gated CT examination. Assessment for potential risk factor modification, dietary therapy or pharmacologic therapy may be warranted, if clinically indicated.   Aortic Atherosclerosis (ICD10-I70.0).     Electronically Signed   By: Vinnie Langton M.D.   On: 06/01/2020 10:20 [id number]  04/10/2021 follow-up; Bronchitis, Pulmonary fibrosis Patient returns for a follow-up visit, patient was seen last week for cough, congestion, shortness of breath and chills.  COVID-19 testing was negative.  Patient had been fully vaccinated for COVID-19.  She had no increased oxygen demands.  Patient was given doxycycline for 7 days and prednisone 20 mg for 5 days. Patient complains ongoing cough and congestion , mucus remains green on/off. No  fever.  Eating well , no n/v.d. No hemoptysis , orthopnea or edema.  Called in over the weekend and was called in steroid burst. Currently on Prednisone 76m daily .  Chest xray showed chronic changes with no acute process.  [place] [facility], [place].   04/17/2021- Interim hx  Patient presents today follow-up with PFTs. She did not have PFTs done today because she is still not feeling well. She completed course of doxycycline and has two days left of prednisone. Reports "cold symptoms" described as sinus drainage and cough. Sputum has not real purulence. Energy level is low. States that she feels her head is not right. She is using Flonase ans Claritin. She is short of breath. She completed doxycycline twice with no improvement. She is taking mucinex twice a day. She has not used albuterol rescue inhaler. Her ex husband died two weeks ago, she found out two days ago. Denies depressed feelings.     OV 05/15/2021  Subjective:  Patient ID: BMarcellina [name] female , DOB: [birth year], age 80y.o. , MRN: [id number], [address] [address] MKathyrn Lass MD Patient Care Team: MKathyrn Lass MD as PCP - General (Family Medicine) Croitoru, MDani Gobble MD as PCP - Cardiology (Cardiology) NMaisie Fus MD as Consulting Physician (Obstetrics and Gynecology) MRichmond Campbell MD as Consulting Physician (Gastroenterology) Croitoru, MDani Gobble MD as Consulting Physician (Cardiology) OKathie Rhodes MD (Inactive) as Consulting Physician (Urology) JDanella Sensing MD as Consulting Physician (Dermatology) RBrand Males MD as Consulting Physician (Pulmonary Disease) DBo Merino MD as Consulting Physician (Rheumatology) MBarbaraann Cao OD as Referring Physician (Optometry) GBerenice Primas MD as Referring Physician (Orthopedic Surgery)  This Provider for this visit: Treatment Team:  Attending Provider: RBrand Males MD   Follow-up IPF   - sept  09, 2021 update - IPF dx givien: age greater than 627 only trace positive ANAdescription of small hiatal hernia on CT scan, the type of pattern on the CT scan called probable UIP, autoimmune antibodies negative last year    - last CT  July 2021   -last PFT sept 201  - portable o2 since sept 2021  -esbriet start end sept 2021 -> stopped 11/02/20 due to fatigue -> rstar 11/09/2020 -. Max dose 2 pilll tid since Dec 02, 2020  - Overnight pulse oximetry done on 03/12/2021 shows time spent less than equal to 88% is 40.3 minutes.  Average pulse was 60/min.  Time spent in bradycardia 72.5%. 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 Patient understood and verbalized agreement to proceed Anyone else on call: just patient Patient location: [phone] This provider location: [place]  HPI BALEANA [name]89y.o. - changed face to face visit to telephone visit due to > 25F heat. Wantd to know PFT resuts June 2022 copared to sept 2021. FVC down 1.6%, DLCO down 2.1% (compared to y  2years ago FVC declined 7.7%). On Esbeit since Dec 2021/Jan 2022.  Says Dr C sarted on her ditliazem 04/18/21 and after that esbriet became more intolerable. She had constipationa and retain fluids.  So, she stopped all her medications except  eliquis and anti depressant and anti histamine. This means she stopped esbriet approx a week ago. Says that for several days still not feel  better though better today. She is unsure if today is a good day v bad day. But overall feels A Fib is acting up. She wants to restarrtrt esbriet after good w wash out.   She is now using o2 at night but feels is not helping energy levels at day time. I Advised she could return it and then she said is actually helping her and does not want to return it.     SYMPTOM SCALE - ILD 07/21/2019  08/10/2020 262# 09/26/2020 Last Weight  Most recent update: 09/26/2020  1:42 PM    Weight  120.9 kg (266 lb 9.6 oz)             11/09/2020 274# 01/23/2021 279#  O2 use ra ra ra o2 with ex 02 with ex  Shortness of Breath 0 -> 5 scale with 5 being worst (score 6 If unable to do)      At rest 0 0  5 0  Simple tasks - showers, clothes change, eating, shaving _0 Household (dishes, doing bed, laundry) _1 Shopping _2 Walking level at own pace _3 Walking up Stairs _4 Total (40 - 48) Dyspnea Score _5 How bad is your cough? 1 1-_6 How bad is your fatigue _7 nausea  0 0 0 0  vomit  0 0 0 0  diarrhea  0 0 0 1  axiety  2 1 0 0  depression  2 1._8 Simple office walk 185 feet  x  3 laps goal with forehead probe 07/21/2019  08/10/2020  01/23/2021   O2 used ra ra ra  Number laps completed 3 laps 3 3  Comments about pace modertae with walker Slow pace Sow pace  Resting Pulse Ox/HR 97% and 78/min 100% and 74/min 94%and HR 92  Final Pulse Ox/HR 93% and 109/min 77% and 85/min 90 % and HR 100  Desaturated </= 88% no no no  Desaturated <= 3% points Yes, 4 Yes, 13 points Yes, 4 piints  Got Tachycardic >/= 90/min you have pulmonary fibrosis otherwise call interstitial lung disease yes no yes  Symptoms at end of test dyspnea Mild dyspnea Dyspnea throughtou  Miscellaneous comments x Corrected with 2LNC and got      CT Chest data  No results found.    PFT  PFT Results Latest Ref Rng & Units 05/07/2021 08/10/2020 07/19/2019 02/24/2017  FVC-Pre L  2.53 2.57 2.74 2.58  FVC-Predicted Pre % 84 84 93 85  FVC-Post L - - 2.63 2.45  FVC-Predicted Post % - - 89 81  Pre FEV1/FVC % % 86 86 86 82  Post FEV1/FCV % % - - 90 87  FEV1-Pre L 2.17 2.22 2.36 2.12  FEV1-Predicted Pre % 97 97 107 93  FEV1-Post L - - 2.35 2.14  DLCO uncorrected ml/min/mmHg 15.50 15.83 16.52 16.15  DLCO UNC% % 74 76 81 59  DLCO corrected ml/min/mmHg 15.50 15.34 - 14.93  DLCO COR %Predicted % 74 73 - 55  DLVA Predicted % 105 97 104 79  TLC L - 4.50 3.97 -  TLC % Predicted % - 81 74 -  RV % Predicted % - 71 52 -       has a past medical history of Alpha-1-antitrypsin deficiency carrier, Arthritis, Atherosclerosis of aorta (HCC), Atrial fibrillation (HCC), Back pain, Complication of anesthesia, Diastolic dysfunction, Frequent UTI, GERD (gastroesophageal reflux disease), Hypertension, Hypertriglyceridemia, Insulin resistance, Long term current use of anticoagulant, Multiple lung nodules, Muscular deconditioning, MVP (mitral valve prolapse), Obesity, OSA on CPAP, Osteoarthritis, Osteopenia, Pacemaker, PAF (paroxysmal atrial fibrillation) ([place]), Positive ANA (antinuclear antibody), Presbyesophagus, Presence of permanent cardiac pacemaker, Primary osteoarthritis of both hands, Pulmonary fibrosis ([place]), PVC's (premature ventricular contractions), Shortness of breath, Sinus arrest (10/2014), Sleep apnea, Stroke ([place]) (01/2014), Tachy-brady syndrome ([place]) (10/2014), TIA (transient ischemic attack), and Vitamin D deficiency.   reports that she quit smoking about 50 years ago. Her smoking use included cigarettes. She has a 36.00 pack-year smoking history. She has never used smokeless tobacco.  Past Surgical History:  Procedure Laterality Date   CARDIOVERSION N/A 09/02/2017   Procedure: CARDIOVERSION;  Surgeon: Sanda Klein, MD;  Location: [facility];  Service: Cardiovascular;  Laterality: N/A;   COLONOSCOPY  2019   ESOPHAGEAL DILATION     EXCISION VAGINAL CYST     benign  nodules   INSERT / REPLACE / REMOVE PACEMAKER  10/04/2014   Med[employer] Advisa model A2DR01 serial number [id number]   LOOP RECORDER EXPLANT N/A 10/04/2014   Procedure: LOOP RECORDER EXPLANT;  Surgeon: Sanda Klein, MD;  Location: [facility];  Service: Cardiovascular;  Laterality: N/A;   LOOP RECORDER IMPLANT N/A 04/19/2014   Procedure: LOOP RECORDER IMPLANT;  Surgeon: Sanda Klein, MD;  Location: [facility];  Service: Cardiovascular;  Laterality: N/A;   NM MYOCAR PERF WALL MOTION  12/18/2007   normal   PERMANENT PACEMAKER INSERTION N/A 10/04/2014   Procedure: PERMANENT PACEMAKER INSERTION;  Surgeon: Sanda Klein, MD;  Location: [facility];  Service: Cardiovascular;  Laterality: N/A;   TUBAL LIGATION     US ECHOCARDIOGRAPHY  05/15/2010   LA mildly dilated,mild mitral annular ca+, AOV mildly sclerotic, mild asymmetric LVH    Allergies  Allergen Reactions   Ancef [Cefazolin] Hives and Itching   Pseudoephedrine Hcl     Other reaction(s): palpitations (moderate)   Ergotamine    Other Other (See Comments)   Pentobarbital    Caffeine Palpitations    Immunization History  Administered Date(s) Administered   Influenza Split 10/09/2009, 09/19/2010, 09/13/2020   Influenza Whole 09/23/2008   Influenza, High Dose Seasonal PF 09/13/2018, 09/27/2019, 09/26/2020   Influenza,inj,quad, With Preservative 09/04/2017   Influenza-Unspecified 09/17/2014, 09/09/2016, 09/04/2017   Moderna Sars-Covid-2 Vaccination 12/06/2019, 01/03/2020, 10/16/2020   Pneumococcal Conjugate-13 09/29/2014, 10/16/2015   Pneumococcal Polysaccharide-23 04/06/2009, 10/02/2017   Pneumococcal-Unspecified 10/01/2014, 10/02/2017   Td 04/05/1999   Tdap 04/06/2009   Zoster Recombinat (Shingrix) 09/29/2018, 01/19/2019   Zoster, Live 12/02/2008, 09/29/2018, 01/19/2019    Family History  Problem Relation Age of Onset   Heart attack Mother    Sudden death Mother    Stroke Father    Heart failure Father    Alcoholism  Brother    Healthy Sister    Stroke Brother    Alpha-1 antitrypsin deficiency Daughter    Breast cancer Daughter    Prostate cancer Brother    Stroke Brother    Prostate cancer Brother    Healthy Brother    Prostate cancer Brother    Healthy Daughter    Diabetes Son    Arthritis Son      Current Outpatient Medications:    albuterol (VENTOLIN HFA) 108 (90 Base) MCG/ACT inhaler, Inhale 1-2 puffs into the lungs every 6 (six) hours as needed for wheezing or shortness of breath., Disp: 8 g, Rfl: 1   amoxicillin-clavulanate (AUGMENTIN) 875-125 MG tablet, Take 1 tablet by mouth 2 (two) times daily., Disp: 20 tablet, Rfl: 0   apixaban (ELIQUIS) 5 MG TABS tablet, Take 1 tablet (5 mg total) by mouth 2 (two) times daily., Disp: 180 tablet, Rfl: 3   B Complex-C (SUPER B COMPLEX PO), Take 1 tablet by mouth daily., Disp: , Rfl:    benzonatate (TESSALON PERLES) 100 MG capsule, Take 1 capsule (100 mg total) by mouth 3 (three) times daily as needed for cough., Disp: 30 capsule, Rfl: 2   Biotin 5000 MCG CAPS, Take 5,000 mcg by mouth daily. , Disp: , Rfl:    Calcium Carbonate-Vit D-Min (CALCIUM 1200 PO), Take 1,200 mg by mouth daily. , Disp: , Rfl:    cholecalciferol (VITAMIN D) 1000 UNITS tablet, Take 6,000 Units by mouth daily. , Disp: , Rfl:    Co-Enzyme Q10 100 MG CAPS, Take 100 mg by mouth daily. , Disp: , Rfl:    diazepam (VALIUM) 5 MG tablet, TAKE 1 TABLET EACH DAY. (Patient taking differently: Take 5 mg by mouth daily as needed for anxiety. TAKE 1 TABLET EACH DAY.), Disp: 30 tablet, Rfl: 0   diltiazem (DILACOR XR) 120 MG 24 hr capsule, Take 1 capsule (120 mg total) by mouth daily., Disp: 30 capsule, Rfl: 1   fluticasone (FLONASE) 50 MCG/ACT nasal spray, Place 2 sprays into both nostrils as needed for allergies or rhinitis., Disp: , Rfl:    folic acid (FOLVITE) 654 MCG tablet, Take 400 mcg by mouth daily., Disp: , Rfl:    furosemide (LASIX) 40 MG tablet, Take 1 tablet (40 mg total) by mouth daily.  Take for three days, Disp: 3 tablet,  Rfl: 0   glucosamine-chondroitin 500-400 MG tablet, Take 1 tablet by mouth 2 (two) times daily. , Disp: , Rfl:    loratadine (CLARITIN) 10 MG tablet, Take 10 mg by mouth daily., Disp: , Rfl:    MAGNESIUM CITRATE PO, Take 400 mg by mouth daily. 1 tab daily, Disp: , Rfl:    Multiple Vitamins-Minerals (ZINC PO), , Disp: , Rfl:    nitroGLYCERIN (NITROSTAT) 0.4 MG SL tablet, Place 1 tablet (0.4 mg total) under the tongue every 5 (five) minutes as needed for chest pain., Disp: 25 tablet, Rfl: 1   omeprazole (PRILOSEC) 40 MG capsule, Take 1 capsule by mouth daily., Disp: , Rfl:    Pirfenidone (ESBRIET) 267 MG TABS, Take 2 tablets (534 mg total) by mouth with breakfast, with lunch, and with evening meal., Disp: 180 tablet, Rfl: 11   Resveratrol 250 MG CAPS, Take 250 mg by mouth daily., Disp: , Rfl:    sertraline (ZOLOFT) 100 MG tablet, TAKE 1 TABLET EACH DAY., Disp: 90 tablet, Rfl: 0   TURMERIC PO, Take 1,000 mg by mouth daily., Disp: , Rfl:       Objective:   There were no vitals filed for this visit.  Estimated body mass index is 43.51 kg/m as calculated from the following:   Height as of 04/17/21: _0  (1.702 m).   Weight as of 04/17/21: 277 lb 12.8 oz (126 kg).  _1 @  There were no vitals filed for this visit.   Physical Exam Telephobne visit - sounded normal     Assessment:       ICD-10-CM   1. IPF (idiopathic pulmonary fibrosis) ([place])  J84.112     2. Encounter for drug therapy  Z79.899          Plan:     Patient Instructions     ICD-10-CM   1. IPF (idiopathic pulmonary fibrosis) ([place])  J84.112     2. Encounter for drug therapy  Z79.899        2% decline in breathing test since sept 2021 -> March 2022. Clinically probably not significant  Noted you are on esbriet holiday  Noted night and exertional o2 helping you  LFT normal may 2022  Plan - In 1 week restart pirfenidone as 3 pills x 1  times daily with food and  then week later do 3 pills twie daily. This will be your maximum dose for pirfenidone.  We will not escalate further past 6 tabs per day  -We can consider nintedanib in the future if the low-dose pirfenidone also fails  -Currently we are not considering nintedanib because of associated Eliquis intake  -Continue oxygen with exertion and at night  - do LFT check in 3 mon  Follow-up - r eturn to see Dr Chase Caller in 3 months - 30 min slot for IPF - walk test   (Telephone visit - Level 03 visit: Estb 21-30 for this visit type which was visit type: telephone visit in total care time and counseling or/and coordination of care by this undersigned MD - Dr Brand Males. This includes one or more of the following for care delivered on 05/15/2021 same day: pre-charting, chart review, note writing, documentation discussion of test results, diagnostic or treatment recommendations, prognosis, risks and benefits of management options, instructions, education, compliance or risk-factor reduction. It excludes time spent by the [place] or office staff in the care of the patient. Actual time was 23 min. E&M code is [phone])   SIGNATURE  Dr. Brand Males, M.D., F.C.C.P,  Pulmonary and Critical Care Medicine Staff Physician, [place] Director - Interstitial Lung Disease  Program  P[facility] at [name], [place], [address]  Pager: [phone], If no answer or between  15:00h - 7:00h: call [phone] Telephone: [phone]  4:45 PM 05/15/2021 Hillery Hunter

## 2021-05-15 NOTE — Telephone Encounter (Signed)
Her concern is valid . Change to phone visit

## 2021-05-15 NOTE — Telephone Encounter (Signed)
I called and spoke with patient informing about changing to a phone visit due to the heat. Patient verbalized understanding and visit was changed. Nothing further needed.

## 2021-05-21 ENCOUNTER — Other Ambulatory Visit: Payer: Self-pay | Admitting: Internal Medicine

## 2021-05-21 DIAGNOSIS — F331 Major depressive disorder, recurrent, moderate: Secondary | ICD-10-CM

## 2021-05-21 NOTE — Telephone Encounter (Signed)
Patient seeing Dr Sabra Heck for her Primary Care

## 2021-05-30 ENCOUNTER — Encounter: Payer: Medicare Other | Admitting: Internal Medicine

## 2021-06-11 ENCOUNTER — Telehealth: Payer: Self-pay | Admitting: Cardiovascular Disease

## 2021-06-11 NOTE — Telephone Encounter (Signed)
Pt is requesting samples of Eliquis. Please advise pt further

## 2021-06-11 NOTE — Telephone Encounter (Signed)
RN spoke to patient . No samples available. Recommend patient to call again and when she does ask for a patient assistance for Eliquis.  She states she is almost in the doughnut hole , and she tried last year . She did not quailfy last year.

## 2021-06-12 NOTE — Telephone Encounter (Signed)
Returned call to patient. Made patient aware that 3 Boxes of 68m Eliquis LOT: [id number]: 6/24 have been left at the front desk for pick up. Advised patient that patient assistance forms have been left as well, and to fill those forms out and return to office to be signed and faxed. Patient verbalized understanding.   Advised patient to call back to office with any issues, questions, or concerns. Patient verbalized understanding.

## 2021-06-12 NOTE — Telephone Encounter (Signed)
Patient calling the office for samples of medication:   1.  What medication and dosage are you requesting samples for? apixaban (ELIQUIS) 5 MG TABS tablet  2.  Are you currently out of this medication?   N/A  Pt is calling per Sharon's advice wanting to see if there is Eliquis samples available    Pt c/o medication issue:  1. Name of Medication:  apixaban (ELIQUIS) 5 MG TABS tablet  2. How are you currently taking this medication (dosage and times per day)? N/A  3. Are you having a reaction (difficulty breathing--STAT)? No   4. What is your medication issue? Requesting patient assistance for Eliquis per Sharon's recommendation.

## 2021-06-22 DIAGNOSIS — G459 Transient cerebral ischemic attack, unspecified: Secondary | ICD-10-CM | POA: Diagnosis not present

## 2021-06-22 DIAGNOSIS — F329 Major depressive disorder, single episode, unspecified: Secondary | ICD-10-CM | POA: Diagnosis not present

## 2021-06-22 DIAGNOSIS — I10 Essential (primary) hypertension: Secondary | ICD-10-CM | POA: Diagnosis not present

## 2021-06-22 DIAGNOSIS — M1712 Unilateral primary osteoarthritis, left knee: Secondary | ICD-10-CM | POA: Diagnosis not present

## 2021-06-22 DIAGNOSIS — F331 Major depressive disorder, recurrent, moderate: Secondary | ICD-10-CM | POA: Diagnosis not present

## 2021-06-22 DIAGNOSIS — K219 Gastro-esophageal reflux disease without esophagitis: Secondary | ICD-10-CM | POA: Diagnosis not present

## 2021-06-22 DIAGNOSIS — I480 Paroxysmal atrial fibrillation: Secondary | ICD-10-CM | POA: Diagnosis not present

## 2021-06-26 ENCOUNTER — Telehealth: Payer: Self-pay | Admitting: Internal Medicine

## 2021-06-26 NOTE — Telephone Encounter (Signed)
Called and spoke with patient who states that she is having discomfort on R side and back that has been going on for a last couple months. She denies increased shortness of breath or fevers. States that she thought it would go away but it hasn't.   Dr. Chase Caller please advise

## 2021-06-26 NOTE — Telephone Encounter (Signed)
Chronic issue  rt side chest discomfort My next OV with her is mid sept 2022  Plan  - needs phone or face to face with an app next 1 -2 weeks

## 2021-06-26 NOTE — Telephone Encounter (Signed)
I called and spoke with patient regarding MR recs. Patient verbalized understanding but is not wanting to be seen. She is concerned that the [place] she is taking is "messing up something in there" and wants to have LFTs done to make sure her liver is ok. I informed her that I will send this back to Dr. Chase Caller to get his recs.  Dr. Chase Caller, please advise on recs. Thanks!

## 2021-06-27 NOTE — Telephone Encounter (Signed)
Check LFT and based on results we can try ofev holiday too

## 2021-06-28 ENCOUNTER — Other Ambulatory Visit: Payer: Self-pay | Admitting: Internal Medicine

## 2021-06-28 DIAGNOSIS — J8410 Pulmonary fibrosis, unspecified: Secondary | ICD-10-CM

## 2021-06-28 NOTE — Telephone Encounter (Signed)
I called and spoke with patient regarding MR recs. She verbalized understanding and will drop by today or tomorrow to get labs done. I have put in order for LFT and informed patient that once MR has seen the results and given recs, we will call back. Patient verbalized understanding, nothing further needed. Will route to MR to be on the lookout for the lab.  Dr. Chase Caller, just [facility]. Thanks!

## 2021-06-28 NOTE — Telephone Encounter (Signed)
LFT still pending. Will close msg and wait for LFT in resuls section  No results for input(s): AST, ALT, ALKPHOS, BILITOT, PROT, ALBUMIN, INR in the last 168 hours.

## 2021-06-29 ENCOUNTER — Other Ambulatory Visit (INDEPENDENT_AMBULATORY_CARE_PROVIDER_SITE_OTHER): Payer: Medicare Other

## 2021-06-29 DIAGNOSIS — J8410 Pulmonary fibrosis, unspecified: Secondary | ICD-10-CM | POA: Diagnosis not present

## 2021-06-29 LAB — HEPATIC FUNCTION PANEL
ALT: 13 U/L (ref 0–35)
AST: 19 U/L (ref 0–37)
Albumin: 3.9 g/dL (ref 3.5–5.2)
Alkaline Phosphatase: 75 U/L (ref 39–117)
Bilirubin, Direct: 0.1 mg/dL (ref 0.0–0.3)
Total Bilirubin: 0.4 mg/dL (ref 0.2–1.2)
Total Protein: 7.4 g/dL (ref 6.0–8.3)

## 2021-07-03 ENCOUNTER — Telehealth: Payer: Self-pay | Admitting: Internal Medicine

## 2021-07-03 NOTE — Telephone Encounter (Signed)
Her liver function test is normal.  Please tell her that.  She specifically wanted to know these liver function test because of having some right-sided pain which we will discuss at follow-up

## 2021-07-03 NOTE — Patient Instructions (Signed)
xxx

## 2021-07-03 NOTE — Telephone Encounter (Signed)
Called and spoke with pt letting her know the info stated by MR and she verbalized understanding. Nothing further needed.

## 2021-07-03 NOTE — Progress Notes (Signed)
Recent Labs  Lab 06/29/21 0953  AST 19  ALT 13  ALKPHOS 75  BILITOT 0.4  PROT 7.4  ALBUMIN 3.9    LFT normal  Plan  - Pls let her know LFT normal because she hwas having some riht sided pain which we can discuss on clinic at followup     SIGNATURE    Dr. Brand Males, M.D., F.C.C.P,  Pulmonary and Critical Care Medicine Staff Physician, [place] Director - Interstitial Lung Disease  Program  Pulmonary [facility] at [place], [place], [address]  NPI Number:  NPI #[id number]  Pager: [phone], If no answer  -[place] or [place] [phone] Telephone (clinical office): [phone] Telephone (research): [phone]  1:49 PM 07/03/2021

## 2021-07-06 ENCOUNTER — Telehealth: Payer: Self-pay | Admitting: Cardiovascular Disease

## 2021-07-06 NOTE — Telephone Encounter (Signed)
Patient calling the office for samples of medication:   1.  What medication and dosage are you requesting samples for? apixaban (ELIQUIS) 5 MG TABS tablet  2.  Are you currently out of this medication? No    

## 2021-07-06 NOTE — Telephone Encounter (Signed)
[name] Eliquis 5 mg samples left at front desk. 

## 2021-07-11 ENCOUNTER — Ambulatory Visit (INDEPENDENT_AMBULATORY_CARE_PROVIDER_SITE_OTHER): Payer: Medicare Other

## 2021-07-11 DIAGNOSIS — I495 Sick sinus syndrome: Secondary | ICD-10-CM | POA: Diagnosis not present

## 2021-07-11 LAB — CUP PACEART REMOTE DEVICE CHECK
Battery Remaining Longevity: 47 mo
Battery Voltage: 2.97 V
Brady Statistic AP VP Percent: 0.04 %
Brady Statistic AP VS Percent: 61.23 %
Brady Statistic AS VP Percent: 0.87 %
Brady Statistic AS VS Percent: 37.87 %
Brady Statistic RA Percent Paced: 58.97 %
Brady Statistic RV Percent Paced: 0.96 %
Date Time Interrogation Session: 20220810093926
Implantable Lead Implant Date: 20151103
Implantable Lead Implant Date: 20151103
Implantable Lead Location: 753859
Implantable Lead Location: 753860
Implantable Lead Model: 5076
Implantable Lead Model: 5076
Implantable Pulse Generator Implant Date: 20151103
Lead Channel Impedance Value: 418 Ohm
Lead Channel Impedance Value: 456 Ohm
Lead Channel Impedance Value: 475 Ohm
Lead Channel Impedance Value: 494 Ohm
Lead Channel Pacing Threshold Amplitude: 0.5 V
Lead Channel Pacing Threshold Amplitude: 0.5 V
Lead Channel Pacing Threshold Pulse Width: 0.4 ms
Lead Channel Pacing Threshold Pulse Width: 0.4 ms
Lead Channel Sensing Intrinsic Amplitude: 3.75 mV
Lead Channel Sensing Intrinsic Amplitude: 3.75 mV
Lead Channel Sensing Intrinsic Amplitude: 6.375 mV
Lead Channel Sensing Intrinsic Amplitude: 6.375 mV
Lead Channel Setting Pacing Amplitude: 1.5 V
Lead Channel Setting Pacing Amplitude: 2 V
Lead Channel Setting Pacing Pulse Width: 0.4 ms
Lead Channel Setting Sensing Sensitivity: 2.8 mV

## 2021-07-17 ENCOUNTER — Telehealth: Payer: Self-pay | Admitting: *Deleted

## 2021-07-17 NOTE — Telephone Encounter (Signed)
Eliquis assistance has been faxed.

## 2021-07-20 NOTE — Telephone Encounter (Signed)
Eliquis has been approved through the end of the year.

## 2021-07-26 DIAGNOSIS — K219 Gastro-esophageal reflux disease without esophagitis: Secondary | ICD-10-CM | POA: Diagnosis not present

## 2021-07-26 DIAGNOSIS — G459 Transient cerebral ischemic attack, unspecified: Secondary | ICD-10-CM | POA: Diagnosis not present

## 2021-07-26 DIAGNOSIS — F329 Major depressive disorder, single episode, unspecified: Secondary | ICD-10-CM | POA: Diagnosis not present

## 2021-07-26 DIAGNOSIS — M1712 Unilateral primary osteoarthritis, left knee: Secondary | ICD-10-CM | POA: Diagnosis not present

## 2021-07-26 DIAGNOSIS — F331 Major depressive disorder, recurrent, moderate: Secondary | ICD-10-CM | POA: Diagnosis not present

## 2021-07-26 DIAGNOSIS — I480 Paroxysmal atrial fibrillation: Secondary | ICD-10-CM | POA: Diagnosis not present

## 2021-07-26 DIAGNOSIS — I10 Essential (primary) hypertension: Secondary | ICD-10-CM | POA: Diagnosis not present

## 2021-08-02 NOTE — Progress Notes (Signed)
Remote pacemaker transmission.   

## 2021-08-14 ENCOUNTER — Ambulatory Visit (INDEPENDENT_AMBULATORY_CARE_PROVIDER_SITE_OTHER): Payer: Medicare Other | Admitting: Internal Medicine

## 2021-08-14 ENCOUNTER — Encounter: Payer: Self-pay | Admitting: Internal Medicine

## 2021-08-14 ENCOUNTER — Other Ambulatory Visit: Payer: Self-pay

## 2021-08-14 VITALS — BP 116/72 | HR 73 | Temp 97.9°F | Ht 67.0 in | Wt 275.0 lb

## 2021-08-14 DIAGNOSIS — J84112 Idiopathic pulmonary fibrosis: Secondary | ICD-10-CM | POA: Diagnosis not present

## 2021-08-14 DIAGNOSIS — Z7185 Encounter for immunization safety counseling: Secondary | ICD-10-CM

## 2021-08-14 DIAGNOSIS — Z23 Encounter for immunization: Secondary | ICD-10-CM

## 2021-08-14 NOTE — Progress Notes (Signed)
e former smoker followed for dyspnea on exertion, lung nodules, fibrosis, complicated by A. 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 She does not qualify for portable oxygen CT chest 01/06/17-pulmonary fibrosis, small nodules ANA 1:160 PFT 07/19/2019- mild restriction. FVC 2.94/ 93%, FEV1 2.20/ 107%, R .86, FEF25-75% 3.48/ 110%, TLC 74%, DLCO 81%, no resp to BD ------------------------------------------------------------------------------------------  09/21/2018- 80 year old female former smoker followed for dyspnea on exertion, lung nodules, fibrosis, ANA 3:154, complicated by A. 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 Feeling Sob with activity today. Previously completed pulmonary rehab. Body weight 250 lbs No acute changes in her breathing.  Notices a full/bloated feeling especially after meals, but little wheeze or cough.  Still followed by cardiology for A. Fib/pacemaker. Gradually adjusting to loss of daughter who died from end-stage lung disease related to her alpha-1 antitrypsin status. CT chest 04/03/2018- IMPRESSION: 1. Pulmonary nodules are stable to slightly decreased back to 01/06/2017 chest CT, considered benign. 2. 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 No frank honeycombing. No convincing progression since 01/06/2017 high-resolution chest CT study. 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 Usual interstitial pneumonia is unlikely given absence of progression and presence of air trapping. 3. Stable mild patchy air trapping in  both lungs indicative of small airways disease. Aortic Atherosclerosis (ICD10-I70.0).  07/19/2019- 80 year old female former smoker followed for dyspnea on exertion, lung nodules, fibrosis, ANA 0:086, complicated by A. 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 TKR deferred due to Covid. Activity limited to ADLs and not losing weight.  Discussed her CT showing mild stable fibrosis. Also reviewed Echo and PFT. She is anxious, still grieving death of daughter from a1AT resp failure. She presses me to support trial of glutathione because it made her daughter "look better in the face". She wants to see Dr Chase Caller for his expertise re interstitial lung disease, although she is aware of mild chronic stable fibrosis and stable nodules.   PFT 07/19/2019- mild restriction. FVC 2.94/ 93%, FEV1 2.20/ 107%, R .86, FEF25-75% 3.48/ 110%, TLC 74%, DLCO 81%, no resp to BD  ECHO- 05/18/2019-  1. The left ventricle has low normal systolic function, with an ejection fraction of 50-55%. The cavity size was normal. Left ventricular diastolic function could not be evaluated.  2. The right ventricle has normal systolic function. The cavity was normal.  3. The mitral valve is grossly normal.  4. The tricuspid valve is grossly normal.  5. The aortic valve is tricuspid. Mild thickening of the aortic valve. No stenosis of the aortic valve.  6. Normal LV systolic function; no significant valvular heart disease.  CT chest 04/05/2019- IMPRESSION: No acute cardiopulmonary disease. Mild chronic stable fibrotic change.  Several small subcentimeter mostly peripheral pulmonary nodules with the largest measuring 7 mm over the posterolateral right lower lobe. These are unchanged for greater than 2 years and therefore considered benign. Aortic Atherosclerosis (ICD10-I70.0).   OV  07/21/2019  Subjective:  [name], female , DOB: 1941/09/21 , age 39 y.o. , MRN: [id number] , ADDRESS:  [place] [address]   07/21/2019 -   Chief Complaint  Patient presents with   Follow-up    SOB worsening over past 6 months - increased weight gain     HPI [name] [name] 23 y.o. -is a friend ofl 1 of my pulmonary fibrosis patients.  She is also a patient of Dr. Baird Lyons in our clinic as described above.  She is here to the ILD clinic for second opinion about her pulmonary fibrosis.  She tells me that she was diagnosed with pulmonary fibrosis a few years ago and it has been stable.  Is been of insidious onset.  She had limited autoimmune profile as documented below in 2018 when she had a positive ANA but of low titer.  Her dyspnea is been stable.  She is dealing with other medical issues such as obesity, using a walker because of knee issues and a stroke and also death of her daughter from alpha-1 antitrypsin disease approximately 2 years ago.  She says she has some dyspnea on exertion relieved by rest but overall this is stable.  The dyspnea is definitely significant and out of proportion to the level of ILD as documented by her pulmonary function test which shows stability.  Her CT high-resolution that I was personally visualized from a year ago and his CT chest from 2020 showed bilateral bibasal reticular pattern but no honeycombing or traction bronchiectasis.  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  I do not see any features of alternate diagnosis.  She denies any autoimmune features.  She denies having mold in the house of pet birds.  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       OV 08/10/2020   Subjective:  [name], female , DOB: 1941-07-26, age 59 y.o. years. , MRN: [id number],  ADDRESS: 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  Indeterminate in 2020 CT scan.  Autoimmune negative except for ANA 1: 160.  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  [Daughter died from alpha-1 CC]  Obesity   Diastolic dysfunction  Atrial fibrillation on Eliquis  Obstructive sleep apnea on CPAP without oxygen   HPI [name] [name] 80 y.o. -last seen just over a year ago.  Since then not seen.  She tells me that she think she might be a little bit worse in terms of her shortness of breath than before.  But at the same time she says she is morbidly obese and has cardiac issues.  She also has shoulder rotator cuff tear issues.  She feels that she might be short of breath because of that.  In terms of ILD symptom questionnaire is documented below.  Symptoms are actually a little bit better than last year.  She had pulmonary function test and this is stable.  She had high-resolution CT chest.  Dr. 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  In terms of her walking desaturation test: desaturated -she desaturated the end of 1 lap.  She required 2 L of nasal cannula  to correct.  She said that after that she started feeling better.  She also reported that when she did physical therapy she felt as exhausted and sleeps a lot.   Noted that she is on polypharmacy has obesity, heart disease and is on Eliquis   OV 09/26/2020   Subjective:  [name], female , DOB: 1941/08/31, age 50 y.o. years. , MRN: [id number],  ADDRESS: [address] [place] [address] PCP  Virgie Dad, MD Providers : Treatment Team:  Attending Provider: Brand Males, MD Patient Care Team: Virgie Dad, MD as PCP - General  (Internal Medicine) Sanda Klein, MD as PCP - Cardiology (Cardiology) Maisie Fus, MD as Consulting Physician (Obstetrics and Gynecology) Richmond Campbell, MD as Consulting Physician (Gastroenterology) Croitoru, Dani Gobble, MD as Consulting Physician (Cardiology) Kathie Rhodes, MD (Inactive) as Consulting Physician (Urology) Danella Sensing, MD as Consulting Physician (Dermatology) Brand Males, MD as Consulting Physician (Pulmonary Disease) Bo Merino, MD as Consulting Physician (Rheumatology) Barbaraann Cao, OD as Referring Physician (Optometry) Berenice Primas, MD as Referring Physician (Orthopedic Surgery)    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  Indeterminate in 02/06/2019 CT scan.  Autoimmune negative except for ANA 1: 160.  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  [Daughter died from alpha-1 CC]  Obesity   Diastolic dysfunction  Atrial fibrillation on Eliquis  Obstructive sleep apnea on CPAP without oxygen  Chief Complaint  Patient presents with   Follow-up    ILD, still SOB, cannot get O2 from [employer]       HPI [name] [name] 80 y.o. -returns for follow-up of IPF.  This visit is to make sure that pirfenidone start has gone well.  She says she is now into 4 weeks of pirfenidone.  She is taking 3 pills 3 times daily for the last 1 week.  So far no GI site issues.  Her baseline symptom profile includes fatigue and it is stable.  Dyspnea stable.  She is frustrated by the lack of having portable oxygen system.  She says the oxygen DME company is yet to provide her.  Apparently my office did not do the right paperwork but she thinks it is the DME  company.  Nevertheless she wants to try another DME company.  She also has some occasional mild intermittent epistaxis by using her oxygen which dries her nose up.  Otherwise overall stable.  She is inquiring about the Covid booster.  She has not yet had a flu shot but will have it today.    OV 11/09/2020   Subjective:  [name], female , DOB: 1941/04/25, age 30 y.o. years. , MRN: [id number],  ADDRESS: [address] [place] [address] PCP  [name], MD Providers : Treatment Team:  Attending Provider: Brand Males, MD Patient Care Team: [name], MD as PCP - General (Family Medicine) Sanda Klein, MD as PCP - Cardiology (Cardiology) Maisie Fus, MD as Consulting Physician (Obstetrics and Gynecology) Richmond Campbell, MD as Consulting Physician (Gastroenterology) Croitoru, Dani Gobble, MD as Consulting Physician (Cardiology) Kathie Rhodes, MD (Inactive) as Consulting Physician (Urology) Danella Sensing, MD as  Consulting Physician (Dermatology) Brand Males, MD as Consulting Physician (Pulmonary Disease) Bo Merino, MD as Consulting Physician (Rheumatology) Barbaraann Cao, OD as Referring Physician (Optometry) Berenice Primas, MD as Referring Physician (Orthopedic Surgery)    Chief Complaint  Patient presents with   Follow-up    ILD, doing ok, feels she is forgetting more and not thinking clearly.      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 -returns for follow-up of IPF.  This visit is to ensure continued tolerance of pirfenidone and to address other issues.  She tells me that approximately a week ago because of severe fatigue she stopped the pirfenidone.  After this the fatigue resolved.  She is wondering about future options in her therapy.  We discussed nintedanib but she is on Eliquis and she has a history of congestive heart failure although not necessarily coronary artery disease.  She is a little bit wary about starting nintedanib because of this.  We discussed the other option of restarting the the pirfenidone but going for a submaximal dose of 2 pills 3 times daily with gradual up titration.  She is open to this.  Therefore we will start at 1 pill 3 times daily which she will continue through the end of this year/month and then next month/next day we will start at 2 pills 3 times daily which would be her maximum dose.  She use oxygen with exertion.  The overnight oxygen saturation test has not been done yet.  We do not know why.  It is possible that she might not be answering her phone calls.      OV 01/23/2021   Subjective:  [name] female , DOB: 105/08/1941 age 80y.o. years. , MRN: [id number]  [address] [address]PCP  MKathyrn Lass MD Providers : Treatment Team:  Attending Provider: RBrand Males MD Patient Care Team: MKathyrn Lass MD as PCP - General (Family Medicine) Croitoru, MDani Gobble MD as PCP - Cardiology (Cardiology) NMaisie Fus MD as Consulting Physician (Obstetrics and Gynecology) MRichmond Campbell MD as Consulting Physician (Gastroenterology) Croitoru, MDani Gobble MD as Consulting Physician (Cardiology) OKathie Rhodes MD (Inactive) as Consulting Physician (Urology) JDanella Sensing MD as Consulting  Physician (Dermatology) RBrand Males MD as Consulting Physician (Pulmonary Disease) DBo Merino MD as Consulting Physician (Rheumatology) MBarbaraann Cao OD as Referring Physician (Optometry) GBerenice Primas MD as Referring Physician (Orthopedic Surgery)    Chief Complaint  Patient presents with   Follow-up    SOB with exertion       Follow-up IPF   - sept 09, 2021 update - IPF dx givien: age greater than 655 only trace positive ANAdescription of small hiatal hernia on CT scan, the type of  pattern on the CT scan called probable UIP, autoimmune antibodies negative last year    - last CT July 2021   -last PFT sept 201  - portable o2 since sept 2021  -esbriet start end sept 2021 -> stopped 11/02/20 due to fatigue -> rstar 11/09/2020 -. 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 -returns for follow-up. Last visit she had fatigue with pirfenidone therefore we reduce the total intake of pirfenidone to a max of 6 pills 3 times daily. Visit to third of the optimal dose which is still effective. She is taking 2 pills 3 times daily now since early part of January 2022. She finds this to be quite tolerable without any fatigue. But then she also admitted that she misses the afternoon dose on many/some days. She did have incidental COVID on 12/28/2020. There are no GI symptoms  She is complaining of 3 punctate to 4 punctate lesions on her upper chest. There apparently bedbugs in the place where she lives. There is no rash in the sun exposed areas.     Results for [name] [name], [name] [name](MRN [id number] as of 07/21/2019 15:57  Ref. Range 02/24/2017 15:55  Anti Nuclear  Antibody (ANA) Latest Ref Range: NEGATIVE  POS (A)  ANA Pattern 1 Unknown HOMOGENEOUS (A)  ANA Titer 1 Latest Units: titer 1:160 (H)  RA Latex Turbid. Latest Ref Range: <14 IU/mL <14     CT chest high resolution June 2021   CLINICAL DATA:  80year old female with history of interstitial lung disease.   EXAM: CT CHEST WITHOUT CONTRAST   TECHNIQUE: Multidetector CT imaging of the chest was performed following the standard protocol without intravenous contrast. High resolution imaging of the lungs, as well as inspiratory and expiratory imaging, was performed.   COMPARISON:  Chest CT 04/05/2019.   FINDINGS: Cardiovascular: Heart size is normal. There is no significant pericardial fluid, thickening or pericardial calcification. There is aortic atherosclerosis, as well as atherosclerosis of the great vessels of the mediastinum and the coronary arteries, including calcified atherosclerotic plaque in the left circumflex coronary artery.   Mediastinum/Nodes: No pathologically enlarged mediastinal or hilar lymph nodes. Please note that accurate exclusion of hilar adenopathy is limited on noncontrast CT scans. Small hiatal hernia. No axillary lymphadenopathy.   Lungs/Pleura: Multiple small pulmonary nodules are noted throughout the lungs bilaterally, largest of which is in the right lower lobe (axial image 160 of series 3) measuring 7 x 3 mm (mean diameter 5 mm). These are stable in size and number compared to prior examinations dating back to 202-15-18 No other larger more suspicious appearing pulmonary nodules or masses are noted. No acute consolidative airspace disease. No pleural effusions. High-resolution images demonstrates some patchy areas with mild peripheral predominant ground-glass attenuation, septal thickening, some scattered areas of subpleural reticulation, peripheral predominant traction bronchiectasis and bronchiolectasis, and possible (but not definitive) early  honeycombing most evident in the periphery of the left lower lobe. These findings have a definitive craniocaudal gradient and appear mildly progressive compared to the prior examination. Inspiratory and expiratory imaging demonstrates mild air trapping indicative of small airways disease.   Upper Abdomen: Aortic atherosclerosis. Calcified  granuloma in the liver.   Musculoskeletal: There are no aggressive appearing lytic or blastic lesions noted in the visualized portions of the skeleton.   IMPRESSION: 1. The appearance of the lungs is compatible with interstitial lung disease, which appears mildly progressive compared to the prior examination, with a spectrum of findings considered probable usual interstitial pneumonia (UIP) per current ATS guidelines. 2. Small pulmonary nodules measuring 5 mm or less in size, stable dating back to 2018, considered definitively benign. 3. Aortic atherosclerosis, in addition to left circumflex coronary artery disease. Please note that although the presence of coronary artery calcium documents the presence of coronary artery disease, the severity of this disease and any potential stenosis cannot be assessed on this non-gated CT examination. Assessment for potential risk factor modification, dietary therapy or pharmacologic therapy may be warranted, if clinically indicated.   Aortic Atherosclerosis (ICD10-I70.0).     Electronically Signed   By: Vinnie Langton M.D.   On: 06/01/2020 10:20 [id number]  04/10/2021 follow-up; Bronchitis, Pulmonary fibrosis Patient returns for a follow-up visit, patient was seen last week for cough, congestion, shortness of breath and chills.  COVID-19 testing was negative.  Patient had been fully vaccinated for COVID-19.  She had no increased oxygen demands.  Patient was given doxycycline for 7 days and prednisone 20 mg for 5 days. Patient complains ongoing cough and congestion , mucus remains green on/off. No  fever.  Eating well , no n/v.d. No hemoptysis , orthopnea or edema.  Called in over the weekend and was called in steroid burst. Currently on Prednisone 51m daily .  Chest xray showed chronic changes with no acute process.  [place]   04/17/2021- Interim hx  Patient presents today follow-up with PFTs. She did not have PFTs done today because she is still not feeling well. She completed course of doxycycline and has two days left of prednisone. Reports "cold symptoms" described as sinus drainage and cough. Sputum has not real purulence. Energy level is low. States that she feels her head is not right. She is using Flonase ans Claritin. She is short of breath. She completed doxycycline twice with no improvement. She is taking mucinex twice a day. She has not used albuterol rescue inhaler. Her ex husband died two weeks ago, she found out two days ago. Denies depressed feelings.     OV 05/15/2021  Subjective:  [name] female , DOB: 101/10/1941, age 80y.o. , MRN: [id number], ADDRESS: [address] [address] MKathyrn Lass MD Patient Care Team: MKathyrn Lass MD as PCP - General (Family Medicine) Croitoru, MDani Gobble MD as PCP - Cardiology (Cardiology) NMaisie Fus MD as Consulting Physician (Obstetrics and Gynecology) MRichmond Campbell MD as Consulting Physician (Gastroenterology) Croitoru, MDani Gobble MD as Consulting Physician (Cardiology) OKathie Rhodes MD (Inactive) as Consulting Physician (Urology) JDanella Sensing MD as Consulting Physician (Dermatology) RBrand Males MD as Consulting Physician (Pulmonary Disease) DBo Merino MD as Consulting Physician (Rheumatology) MBarbaraann Cao OD as Referring Physician (Optometry) GBerenice Primas MD as Referring Physician (Orthopedic Surgery)  This Provider for this visit: Treatment Team:  Attending Provider: RBrand Males MD   Follow-up IPF   - sept  09, 2021 update - IPF dx givien: age greater than 662 only trace positive ANAdescription of small hiatal hernia on CT scan, the type of pattern on the CT scan called probable UIP, autoimmune antibodies negative last year    - last CT July  02-02-20   -last PFT sept 201  - portable o2 since sept 02/02/20  -esbriet start end sept 2021 -> stopped 11/02/20 due to fatigue -> rstar 11/09/2020 -. Max dose 2 pilll tid since Dec 02, 2020  - Overnight pulse oximetry done on 03/12/2021 shows time spent less than equal to 88% is 40.3 minutes.  Average pulse was 60/min.  Time spent in bradycardia 72.5%. 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 Patient understood and verbalized agreement to proceed Anyone else on call: just patient Patient location: [phone] This provider location: [address], [place]  HPI [name] [id number].o. - changed face to face visit to telephone visit due to > 35F heat. Wantd to know PFT resuts June 2022 copared to sept 2021. FVC down 1.6%, DLCO down 2.1% (compared to y  2years ago FVC declined 7.7%). On Esbeit since Dec 2021/Jan 2022.  Says Dr C sarted on her ditliazem 04/18/21 and after that esbriet became more intolerable. She had constipationa and retain fluids.  So, she stopped all her medications except  eliquis and anti depressant and anti histamine. This means she stopped esbriet approx a week ago. Says that for several days still not feel  better though better today. She is unsure if today is a good day v bad day. But overall feels A Fib is acting up. She wants to restarrtrt esbriet after good w wash out.   She is now using o2 at night but feels is not helping energy levels at day time. I Advised she could return it and then she said is actually helping her and does not want to return it.    CT Chest data   OV 08/14/2021  Subjective:  [name] female , DOB: [birth year], age 80y.o. , MRN: [id number], [id number] MKathyrn Lass MD Patient Care Team: MKathyrn Lass MD as PCP - General (Family Medicine) Croitoru, MDani Gobble MD as PCP - Cardiology (Cardiology) NMaisie Fus MD as Consulting Physician (Obstetrics and Gynecology) MRichmond Campbell MD as Consulting Physician (Gastroenterology) Croitoru, MDani Gobble MD as Consulting Physician (Cardiology) OKathie Rhodes MD (Inactive) as Consulting Physician (Urology) JDanella Sensing MD as Consulting Physician (Dermatology) RBrand Males MD as Consulting Physician (Pulmonary Disease) DBo Merino MD as Consulting Physician (Rheumatology) MBarbaraann Cao OD as Referring Physician (Optometry) GBerenice Primas MD as Referring Physician (Orthopedic Surgery)  This Provider for this visit: Treatment Team:  Attending Provider: RBrand Males MD    08/14/2021 -   Chief Complaint  Patient presents with   Follow-up    Pt states she has been doing okay since last visit. Still becomes SOB with exertion.    Follow-up IPF   - sept 09, 2021 update - IPF dx givien: age greater than 672 only trace positive ANAdescription of small hiatal hernia on CT scan, the type of pattern on the CT scan called probable UIP,  autoimmune antibodies negative last year    - last CT July 2021   -last PFT sept 201  - portable o2 since sept 2021  -esbriet start end sept 2021 -> stopped 11/02/20 due to fatigue -> rstar 11/09/2020  -. Max dose 2 pilll tid since Dec 02, 2020 -> intermittent start/stop summer 2022 -> stopped aug 2022  - Overnight pulse oximetry done on 03/12/2021 shows time spent less than equal to 88% is 40.3 minutes.  Average pulse was 60/min.  Time spent in bradycardia 72.5%. Start 2 L nasal cannula at night  Multiple Lung Nodules 47m  - stable 2Feb 20, 2018-> July 2021   Alpha-1 MZ but does not have emphysema on CT 22021-02-20  [Daughter died from alpha-1 CC]  Obesity   Diastolic dysfunction  Atrial fibrillation on Eliquis  Obstructive sleep apnea on CPAP without oxygen  COVID-19 incidental 12/28/2020 - likely Omicron4    HPI [name] [id number]. -returns for follow-up.  Since her last visit because she was having calls with side effects from pirfenidone.  She stopped it.  She says after that she started feeling better.  Despite this she is reporting progressive decline in dyspnea.  Dyspnea score is listed below.  She uses oxygen with exertion as needed.  I at night she uses CPAP without oxygen.  She is frustrated with pirfenidone.  She asked about the alternative nintedanib.  She recollects that there constraints with it in the setting of heart disease and anticoagulation.  Explained that one of the study showed 0.5% increase incidence and MI with nintedanib.  But not the other studies.  Explained that surgical mechanism of anticoagulation but clinically patient have tolerated.  Explained about GI side effects particularly diarrhea.  She is nervous and hesitant but finally we took a shared decision making that she would start it at a lower dose given the fact it can be beneficial in reducing risk of progression.  We also discussed various aspects of clinical trials documented below.  She is interested in this as long as it is a medication with a much better side effect profile.  But at this point in time we took a shared decision making to go with standard of care therapy first.  She will have the flu shot  today.  She is very nervous about getting mRNA COVID booster because of the side effect profile.  She had significant side effects apparently.  She also had omicron COVID in January 2022 and she is wondering about natural immunity.  However she is aware is COVID everywhere currently.  We took a shared decision making to check IgG antibody levels and adjust the risk threshold accordingly.   1. Scientific Purpose  Clinical research is designed to produce generalizable knowledge and to answer questions about the safety and efficacy of intervention(s) under study in order to determine whether or not they may be useful for the care of future patients.  2. Study Procedures  Participation in a trial may involve procedures or tests, in addition to the intervention(s) under study, that are intended only or primarily to generate scientific knowledge and that are otherwise not necessary for patient care.   3. Uncertainty  For intervention(s) under study in clinical research, there often is less knowledge and more uncertainty about the risks and benefits to a population of trial participants than there is when a doctor offers a patient standard interventions.   4. Adherence to Protocol  Administration of the intervention(s) under study is typically  based on a strict protocol with defined dose, scheduling, and use or avoidance of concurrent medications, compared to administration of standard interventions.  5. Clinician as Investigator  Clinicians who are in health care settings provide treatment; in a clinical trial setting, they are also investigating safety and efficacy of an intervention. In otherwise your doctor or nurse practitioner can be wearing 2 hats - one as care giver another as Company secretary  6. Patient as Visual merchandiser Subject  Patients participating in research trials are research subjects or volunteers. In other words participating in research is 100% voluntary and at  one's own free weill. The decision to participate or not participate will NOT affect patient care and the doctor-patient relationship in any way     SYMPTOM SCALE - ILD 07/21/2019  08/10/2020 262# 09/26/2020 Last Weight  Most recent update: 09/26/2020  1:42 PM    Weight  120.9 kg (266 lb 9.6 oz)             11/09/2020 274# 01/23/2021 279# 08/14/2021 275#  O2 use ra ra ra o2 with ex 02 with ex   Shortness of Breath 0 -> 5 scale with 5 being worst (score 6 If unable to do)       At rest 0 0  5 0 4  Simple tasks - showers, clothes change, eating, shaving _0 Household (dishes, doing bed, laundry) _1 Shopping _2 Walking level at own pace _3 Walking up Stairs _4 Total (40 - 48) Dyspnea Score _5 How bad is your cough? 1 1-_6 How bad is your fatigue _7 nausea  0 0 0 0 0  vomit  0 0 0 0 0  diarrhea  0 0 0 1 0  axiety  2 1 0 0 0.5  depression  2 1._8 Simple office walk 185 feet x  3 laps goal with forehead probe 07/21/2019  08/10/2020  01/23/2021   O2 used ra ra ra  Number laps completed 3 laps 3 3  Comments about pace modertae with walker Slow pace Sow pace  Resting Pulse Ox/HR 97% and 78/min 100% and 74/min 94%and HR 92  Final Pulse Ox/HR 93% and 109/min 77% and 85/min 90 % and HR 100  Desaturated </= 88% no no no  Desaturated <= 3% points Yes, 4 Yes, 13 points Yes, 4 piints  Got Tachycardic >/= 90/min you have pulmonary fibrosis otherwise call interstitial lung disease yes no yes  Symptoms at end of test dyspnea Mild dyspnea Dyspnea throughtou  Miscellaneous comments x Corrected with 2LNC and got      PFT  PFT Results Latest Ref Rng & Units 05/07/2021 08/10/2020 07/19/2019 02/24/2017  FVC-Pre L 2.53 2.57 2.74 2.58  FVC-Predicted Pre % 84 84 93 85  FVC-Post L - - 2.63 2.45  FVC-Predicted Post % - - 89 81  Pre FEV1/FVC % % 86 86 86 82  Post FEV1/FCV % % - - 90 87  FEV1-Pre L 2.17 2.22 2.36  2.12  FEV1-Predicted Pre % 97 97 107 93  FEV1-Post L - - 2.35 2.14  DLCO uncorrected ml/min/mmHg 15.50 15.83 16.52 16.15  DLCO UNC% % 74 76 81 59  DLCO corrected ml/min/mmHg  15.50 15.34 - 14.93  DLCO COR %Predicted % 74 73 - 55  DLVA Predicted % 105 97 104 79  TLC L - 4.50 3.97 -  TLC % Predicted % - 81 74 -  RV % Predicted % - 71 52 -       has a past medical history of Alpha-1-antitrypsin deficiency carrier, Arthritis, Atherosclerosis of aorta (HCC), Atrial fibrillation ([place]), Back pain, Complication of anesthesia, Diastolic dysfunction, Frequent UTI, GERD (gastroesophageal reflux disease), Hypertension, Hypertriglyceridemia, Insulin resistance, Long term current use of anticoagulant, Multiple lung nodules, Muscular deconditioning, MVP (mitral valve prolapse), Obesity, OSA on CPAP, Osteoarthritis, Osteopenia, Pacemaker, PAF (paroxysmal atrial fibrillation) (HCC), Positive ANA (antinuclear antibody), Presbyesophagus, Presence of permanent cardiac pacemaker, Primary osteoarthritis of both hands, Pulmonary fibrosis ([place]), PVC's (premature ventricular contractions), Shortness of breath, Sinus arrest (10/2014), Sleep apnea, Stroke ([place]) (01/2014), Tachy-brady syndrome ([place]) (10/2014), TIA (transient ischemic attack), and Vitamin D deficiency.   reports that she quit smoking about 51 years ago. Her smoking use included cigarettes. She has a 36.00 pack-year smoking history. She has never used smokeless tobacco.  Past Surgical History:  Procedure Laterality Date   CARDIOVERSION N/A 09/02/2017   Procedure: CARDIOVERSION;  Surgeon: Sanda Klein, MD;  Location: [facility];  Service: Cardiovascular;  Laterality: N/A;   COLONOSCOPY  2019   ESOPHAGEAL DILATION     EXCISION VAGINAL CYST     benign nodules   INSERT / REPLACE / REMOVE PACEMAKER  10/04/2014   Medtronic Advisa model A2DR01 serial number [id number]   LOOP RECORDER EXPLANT N/A 10/04/2014   Procedure: LOOP RECORDER EXPLANT;  Surgeon:  Sanda Klein, MD;  Location: [facility];  Service: Cardiovascular;  Laterality: N/A;   LOOP RECORDER IMPLANT N/A 04/19/2014   Procedure: LOOP RECORDER IMPLANT;  Surgeon: Sanda Klein, MD;  Location: [facility];  Service: Cardiovascular;  Laterality: N/A;   NM MYOCAR PERF WALL MOTION  12/18/2007   normal   PERMANENT PACEMAKER INSERTION N/A 10/04/2014   Procedure: PERMANENT PACEMAKER INSERTION;  Surgeon: Sanda Klein, MD;  Location: [facility];  Service: Cardiovascular;  Laterality: N/A;   TUBAL LIGATION     US ECHOCARDIOGRAPHY  05/15/2010   LA mildly dilated,mild mitral annular ca+, AOV mildly sclerotic, mild asymmetric LVH    Allergies  Allergen Reactions   Ancef [Cefazolin] Hives and Itching   Pseudoephedrine Hcl     Other reaction(s): palpitations (moderate)   Ergotamine    Other Other (See Comments)   Pentobarbital    Caffeine Palpitations    Immunization History  Administered Date(s) Administered   Influenza Split 10/09/2009, 09/19/2010, 09/13/2020   Influenza Whole 09/23/2008   Influenza, High Dose Seasonal PF 09/13/2018, 09/27/2019, 09/26/2020   Influenza,inj,quad, With Preservative 09/04/2017   Influenza-Unspecified 09/17/2014, 09/09/2016, 09/04/2017   Moderna Sars-Covid-2 Vaccination 12/06/2019, 01/03/2020, 10/16/2020   Pneumococcal Conjugate-13 09/29/2014, 10/16/2015   Pneumococcal Polysaccharide-23 04/06/2009, 10/02/2017   Pneumococcal-Unspecified 10/01/2014, 10/02/2017   Td 04/05/1999   Tdap 04/06/2009   Zoster Recombinat (Shingrix) 09/29/2018, 01/19/2019   Zoster, Live 12/02/2008, 09/29/2018, 01/19/2019    Family History  Problem Relation Age of Onset   Heart attack Mother    Sudden death Mother    Stroke Father    Heart failure Father    Alcoholism Brother    Healthy Sister    Stroke Brother    Alpha-1 antitrypsin deficiency Daughter    Breast cancer Daughter    Prostate cancer Brother    Stroke Brother    Prostate cancer Brother  Healthy  Brother    Prostate cancer Brother    Healthy Daughter    Diabetes Son    Arthritis Son      Current Outpatient Medications:    apixaban (ELIQUIS) 5 MG TABS tablet, Take 1 tablet (5 mg total) by mouth 2 (two) times daily., Disp: 180 tablet, Rfl: 3   B Complex-C (SUPER B COMPLEX PO), Take 1 tablet by mouth daily., Disp: , Rfl:    Biotin 5000 MCG CAPS, Take 5,000 mcg by mouth daily. , Disp: , Rfl:    Calcium Carbonate-Vit D-Min (CALCIUM 1200 PO), Take 1,200 mg by mouth daily. , Disp: , Rfl:    cholecalciferol (VITAMIN D) 1000 UNITS tablet, Take 6,000 Units by mouth daily. , Disp: , Rfl:    Co-Enzyme Q10 100 MG CAPS, Take 100 mg by mouth daily. , Disp: , Rfl:    diazepam (VALIUM) 5 MG tablet, TAKE 1 TABLET EACH DAY. (Patient taking differently: Take 5 mg by mouth daily as needed for anxiety. TAKE 1 TABLET EACH DAY.), Disp: 30 tablet, Rfl: 0   fluticasone (FLONASE) 50 MCG/ACT nasal spray, Place 2 sprays into both nostrils as needed for allergies or rhinitis., Disp: , Rfl:    folic acid (FOLVITE) 644 MCG tablet, Take 400 mcg by mouth daily., Disp: , Rfl:    glucosamine-chondroitin 500-400 MG tablet, Take 1 tablet by mouth 2 (two) times daily. , Disp: , Rfl:    loratadine (CLARITIN) 10 MG tablet, Take 10 mg by mouth daily., Disp: , Rfl:    MAGNESIUM CITRATE PO, Take 400 mg by mouth daily. 1 tab daily, Disp: , Rfl:    Multiple Vitamins-Minerals (ZINC PO), , Disp: , Rfl:    omeprazole (PRILOSEC) 40 MG capsule, Take 1 capsule by mouth daily., Disp: , Rfl:    Resveratrol 250 MG CAPS, Take 250 mg by mouth daily., Disp: , Rfl:    sertraline (ZOLOFT) 100 MG tablet, TAKE 1 TABLET EACH DAY., Disp: 90 tablet, Rfl: 0   TURMERIC PO, Take 1,000 mg by mouth daily., Disp: , Rfl:    albuterol (VENTOLIN HFA) 108 (90 Base) MCG/ACT inhaler, Inhale 1-2 puffs into the lungs every 6 (six) hours as needed for wheezing or shortness of breath. (Patient not taking: Reported on 08/14/2021), Disp: 8 g, Rfl: 1   diltiazem  (DILACOR XR) 120 MG 24 hr capsule, Take 1 capsule (120 mg total) by mouth daily. (Patient not taking: Reported on 08/14/2021), Disp: 30 capsule, Rfl: 1   furosemide (LASIX) 40 MG tablet, Take 1 tablet (40 mg total) by mouth daily. Take for three days, Disp: 3 tablet, Rfl: 0   nitroGLYCERIN (NITROSTAT) 0.4 MG SL tablet, Place 1 tablet (0.4 mg total) under the tongue every 5 (five) minutes as needed for chest pain. (Patient not taking: Reported on 08/14/2021), Disp: 25 tablet, Rfl: 1   Pirfenidone (ESBRIET) 267 MG TABS, Take 2 tablets (534 mg total) by mouth with breakfast, with lunch, and with evening meal. (Patient not taking: Reported on 08/14/2021), Disp: 180 tablet, Rfl: 11      Objective:   Vitals:   08/14/21 1436  BP: 116/72  Pulse: 73  Temp: 97.9 F (36.6 C)  TempSrc: Oral  SpO2: 97%  Weight: 275 lb (124.7 kg)  Height: _0  (1.702 m)    Estimated body mass index is 43.07 kg/m as calculated from the following:   Height as of this encounter: _1  (1.702 m).   Weight as of this encounter: 275 lb (  124.7 kg).  _0 @  Filed Weights   08/14/21 1436  Weight: 275 lb (124.7 kg)     Physical Exam  General: No distress. obese Neuro: Alert and Oriented x 3. GCS 15. Speech normal Psych: Pleasant Resp:  Barrel Chest - no.  Wheeze - no, Crackles - faint, No overt respiratory distress CVS: Normal heart sounds. Murmurs - no Ext: Stigmata of Connective Tissue Disease - no HEENT: Normal upper airway. PEERL +. No post nasal drip        Assessment:       ICD-10-CM   1. IPF (idiopathic pulmonary fibrosis) ([place])  J84.112 SAR CoV2 Serology (COVID 19)AB(IGG)IA    2. Flu vaccine need  Z23     3. Vaccine counseling  Z71.85 SAR CoV2 Serology (COVID 19)AB(IGG)IA         Plan:     Patient Instructions     ICD-10-CM   1. IPF (idiopathic pulmonary fibrosis) ([place])  J84.112     2. Flu vaccine need  Z23     3. Vaccine counseling  Z71.85       Progressive worsening in  symptoms although your weight itself is stable  -I am concerned that there pulmonary fibrosis slowly getting worse Too bad that pirfenidone did not agree with you  - Elta [place] pirfenidone is allergy  Respect concerns that you have over side effects with the COVID mRNA vaccine  Respect that you have agreed to take flu shot today  Plan - Check COVID IgG today -> if levels are low or absent then I would give a high recommendation for the booster vaccine.  If the levels are still adequate I would still recommended but may be respective choice a little bit more if you refuse  -Flu shot today 08/14/2021  -   -Mark pirfenidone as allergy   -Start nintedanib low-dose 100 mg twice daily [discussed in detail and took a shared decision making of this]   -We discussed clinical trials and appreciate your interest-we will send your name and keep it in the database and contact you if an appropriate study came up  Follow-up - 6-8 weeks do spirometry and DLCO -6-8 weeks do liver function test  Follow-up - 30-minute visit with Dr. Chase Caller in 6 to 8 weeks for IPF follow-up      SIGNATURE    Dr. Brand Males, M.D., F.C.C.P,  Pulmonary and Critical Care Medicine Staff Physician, [place] Director - Interstitial Lung Disease  Program  Pulmonary [facility] [place], [place], [address]  Pager: [phone], If no answer or between  15:00h - 7:00h: call [phone] Telephone: [phone]  3:27 PM 08/14/2021

## 2021-08-14 NOTE — Patient Instructions (Addendum)
ICD-10-CM   1. IPF (idiopathic pulmonary fibrosis) ([place])  J84.112     2. Flu vaccine need  Z23     3. Vaccine counseling  Z71.85       Progressive worsening in symptoms although your weight itself is stable  -I am concerned that there pulmonary fibrosis slowly getting worse Too bad that pirfenidone did not agree with you  - [place] pirfenidone is allergy  Respect concerns that you have over side effects with the COVID mRNA vaccine  Respect that you have agreed to take flu shot today  Plan - Check COVID IgG today -> if levels are low or absent then I would give a high recommendation for the booster vaccine.  If the levels are still adequate I would still recommended but may be respective choice a little bit more if you refuse  -Flu shot today 08/14/2021  -   -Mark pirfenidone as allergy   -Start nintedanib low-dose 100 mg twice daily [discussed in detail and took a shared decision making of this]   -We discussed clinical trials and appreciate your interest-we will send your name and keep it in the database and contact you if an appropriate study came up  Follow-up - 6-8 weeks do spirometry and DLCO -6-8 weeks do liver function test  Follow-up - 30-minute visit with Dr. Chase Caller in 6 to 8 weeks for IPF follow-up

## 2021-08-14 NOTE — Addendum Note (Signed)
Addended by: Lorretta Harp on: 08/14/2021 04:21 PM   Modules accepted: Orders

## 2021-08-15 ENCOUNTER — Telehealth: Payer: Self-pay | Admitting: Internal Medicine

## 2021-08-15 LAB — SARS-COV-2 ANTIBODY(IGG)SPIKE,SEMI-QUANTITATIVE: SARS COV1 AB(IGG)SPIKE,SEMI QN: >150 index — ABNORMAL HIGH (ref <1.00)

## 2021-08-15 NOTE — Telephone Encounter (Signed)
ATC patient, LMTCB 

## 2021-08-15 NOTE — Telephone Encounter (Signed)
Her covid Ig is strongly positive > 150. This ss fro omicron and should be still fairly protective. We can recheck in few months at followup. CDC still recomends the new variant vaxx but if she is scared of side effects, and can manage risk with masking etc., and she wants to hold off the variant vaxx that is fine

## 2021-08-16 ENCOUNTER — Telehealth: Payer: Self-pay | Admitting: Internal Medicine

## 2021-08-16 NOTE — Telephone Encounter (Signed)
Call made to patient, confirmed DOB. Made aware of MR recommendations. She states she will decline at this time and we can revisit later if she feels like she needs it.   Nothing further needed at this time.

## 2021-08-16 NOTE — Telephone Encounter (Signed)
Patient brought [place] Patient Assistance Program application to [place] office.  I placed the documents in Dr. Golden Pop box.

## 2021-08-17 ENCOUNTER — Telehealth: Payer: Self-pay

## 2021-08-17 ENCOUNTER — Other Ambulatory Visit (HOSPITAL_COMMUNITY): Payer: Self-pay

## 2021-08-17 NOTE — Telephone Encounter (Signed)
Received new start paperwork for OFEV. Will begin BIV.  Submitted a Prior Authorization request to [employer] for OFEV via CoverMyMeds. Will update once we receive a response.   Key: [id number]

## 2021-08-17 NOTE — Telephone Encounter (Addendum)
Received notification from [employer] regarding a prior authorization for [name] [name]. Authorization has been APPROVED from 07/18/2021 to 08/17/2022.   Patient must fill through [place] (pulmonary fibrosis team). [phone]  Authorization # [id number]  Awaiting confirmation letter from insurance to submit with PAP paperwork. Complete set of signed forms, financial docs, insurance cards and allergy/medication list already assembled and in "Awaiting Info" folder

## 2021-08-17 NOTE — Telephone Encounter (Signed)
Submitted Patient Assistance Application to [employer] for OFEV along with provider portion, PA and income documents. Will update patient when we receive a response.  Fax# [phone] Phone# [phone] 

## 2021-08-21 ENCOUNTER — Telehealth: Payer: Self-pay | Admitting: Pharmacist

## 2021-08-21 DIAGNOSIS — J84112 Idiopathic pulmonary fibrosis: Secondary | ICD-10-CM

## 2021-08-21 DIAGNOSIS — Z5181 Encounter for therapeutic drug level monitoring: Secondary | ICD-10-CM

## 2021-08-21 MED ORDER — OFEV 100 MG PO CAPS: 100.0000 mg | ORAL_CAPSULE | Freq: Two times a day (BID) | ORAL | 1 refills | Status: DC

## 2021-08-21 NOTE — Telephone Encounter (Signed)
Patient counseled on Ofev in separate telephone encounter.  Knox Saliva, PharmD, MPH, BCPS Clinical Pharmacist (Rheumatology and Pulmonology)

## 2021-08-21 NOTE — Telephone Encounter (Signed)
Subjective:  Patient called today by [facility] Pulmonary pharmacy team for Ofev new start counseling.   Patient was last seen by Dr. Chase Caller on 08/14/21. Pertinent past medical history includes IPF, TIA, history of aortic atherosclerosis, PAF, OSA on CPAP, obesity, alpha-1-antitrypsin deficiency carrier. Prior therapy includes Esbriet.  History of CAD: Yes History of MI: No Current anticoagulant use: Yes History of HTN: No  History of elevated LFTs: No History of diarrhea, nausea, vomiting: No  Objective: Allergies  Allergen Reactions   Ancef [Cefazolin] Hives and Itching   Pseudoephedrine Hcl     Other reaction(s): palpitations (moderate)   Ergotamine    Other Other (See Comments)   Pentobarbital    Pirfenidone    Caffeine Palpitations    Outpatient Encounter Medications as of 08/21/2021  Medication Sig   albuterol (VENTOLIN HFA) 108 (90 Base) MCG/ACT inhaler Inhale 1-2 puffs into the lungs every 6 (six) hours as needed for wheezing or shortness of breath. (Patient not taking: Reported on 08/14/2021)   apixaban (ELIQUIS) 5 MG TABS tablet Take 1 tablet (5 mg total) by mouth 2 (two) times daily.   B Complex-C (SUPER B COMPLEX PO) Take 1 tablet by mouth daily.   Biotin 5000 MCG CAPS Take 5,000 mcg by mouth daily.    Calcium Carbonate-Vit D-Min (CALCIUM 1200 PO) Take 1,200 mg by mouth daily.    cholecalciferol (VITAMIN D) 1000 UNITS tablet Take 6,000 Units by mouth daily.    Co-Enzyme Q10 100 MG CAPS Take 100 mg by mouth daily.    diazepam (VALIUM) 5 MG tablet TAKE 1 TABLET EACH DAY. (Patient taking differently: Take 5 mg by mouth daily as needed for anxiety. TAKE 1 TABLET EACH DAY.)   diltiazem (DILACOR XR) 120 MG 24 hr capsule Take 1 capsule (120 mg total) by mouth daily. (Patient not taking: Reported on 08/14/2021)   fluticasone (FLONASE) 50 MCG/ACT nasal spray Place 2 sprays into both nostrils as needed for allergies or rhinitis.   folic acid (FOLVITE) 094 MCG tablet Take 400 mcg  by mouth daily.   furosemide (LASIX) 40 MG tablet Take 1 tablet (40 mg total) by mouth daily. Take for three days   glucosamine-chondroitin 500-400 MG tablet Take 1 tablet by mouth 2 (two) times daily.    loratadine (CLARITIN) 10 MG tablet Take 10 mg by mouth daily.   MAGNESIUM CITRATE PO Take 400 mg by mouth daily. 1 tab daily   Multiple Vitamins-Minerals (ZINC PO)    nitroGLYCERIN (NITROSTAT) 0.4 MG SL tablet Place 1 tablet (0.4 mg total) under the tongue every 5 (five) minutes as needed for chest pain. (Patient not taking: Reported on 08/14/2021)   omeprazole (PRILOSEC) 40 MG capsule Take 1 capsule by mouth daily.   Resveratrol 250 MG CAPS Take 250 mg by mouth daily.   sertraline (ZOLOFT) 100 MG tablet TAKE 1 TABLET EACH DAY.   TURMERIC PO Take 1,000 mg by mouth daily.   No facility-administered encounter medications on file as of 08/21/2021.     Immunization History  Administered Date(s) Administered   Fluad Quad(high Dose 65+) 08/14/2021   Influenza Split 10/09/2009, 09/19/2010, 09/13/2020   Influenza Whole 09/23/2008   Influenza, High Dose Seasonal PF 09/13/2018, 09/27/2019, 09/26/2020   Influenza,inj,quad, With Preservative 09/04/2017   Influenza-Unspecified 09/17/2014, 09/09/2016, 09/04/2017   Moderna Sars-Covid-2 Vaccination 12/06/2019, 01/03/2020, 10/16/2020   Pneumococcal Conjugate-13 09/29/2014, 10/16/2015   Pneumococcal Polysaccharide-23 04/06/2009, 10/02/2017   Pneumococcal-Unspecified 10/01/2014, 10/02/2017   Td 04/05/1999   Tdap 04/06/2009   Zoster  Recombinat (Shingrix) 09/29/2018, 01/19/2019   Zoster, Live 12/02/2008, 09/29/2018, 01/19/2019      PFT's TLC  Date Value Ref Range Status  08/10/2020 4.50 L Final      CMP     Component Value Date/Time   NA 135 04/17/2021 1520   NA 141 01/04/2021 1445   K 4.9 04/17/2021 1520   CL 100 04/17/2021 1520   CO2 29 04/17/2021 1520   GLUCOSE 100 (H) 04/17/2021 1520   BUN 25 (H) 04/17/2021 1520   BUN 24 01/04/2021  1445   CREATININE 1.32 (H) 04/17/2021 1520   CREATININE 1.09 (H) 07/28/2020 0913   CALCIUM 9.1 04/17/2021 1520   PROT 7.4 06/29/2021 0953   PROT 6.5 07/21/2019 1444   ALBUMIN 3.9 06/29/2021 0953   ALBUMIN 4.1 07/21/2019 1444   AST 19 06/29/2021 0953   ALT 13 06/29/2021 0953   ALKPHOS 75 06/29/2021 0953   BILITOT 0.4 06/29/2021 0953   BILITOT 0.3 07/21/2019 1444   GFRNONAA 51 (L) 01/04/2021 1445   GFRNONAA 48 (L) 07/28/2020 0913   GFRAA 59 (L) 01/04/2021 1445   GFRAA 56 (L) 07/28/2020 0913      CBC    Component Value Date/Time   WBC 20.1 Repeated and verified X2. (HH) 04/17/2021 1520   RBC 5.22 (H) 04/17/2021 1520   HGB 14.7 04/17/2021 1520   HGB 14.8 01/04/2021 1445   HCT 44.6 04/17/2021 1520   HCT 43.3 01/04/2021 1445   PLT 267.0 04/17/2021 1520   PLT 283 01/04/2021 1445   MCV 85.5 04/17/2021 1520   MCV 85 01/04/2021 1445   MCH 29.0 01/04/2021 1445   MCH 29.4 07/28/2020 0913   MCHC 32.9 04/17/2021 1520   RDW 13.6 04/17/2021 1520   RDW 12.7 01/04/2021 1445   LYMPHSABS 2.7 04/17/2021 1520   LYMPHSABS 2.0 09/17/2017 1131   MONOABS 1.3 (H) 04/17/2021 1520   EOSABS 0.1 04/17/2021 1520   EOSABS 0.2 09/17/2017 1131   BASOSABS 0.1 04/17/2021 1520   BASOSABS 0.0 09/17/2017 1131      LFT's Hepatic Function Latest Ref Rng & Units 06/29/2021 04/17/2021 03/09/2021  Total Protein 6.0 - 8.3 g/dL 7.4 6.8 6.8  Albumin 3.5 - 5.2 g/dL 3.9 3.6 3.7  AST 0 - 37 U/L _0 ALT 0 - 35 U/L 13 40(H) 14  Alk Phosphatase 39 - 117 U/L 75 84 71  Total Bilirubin 0.2 - 1.2 mg/dL 0.4 0.3 0.4  Bilirubin, Direct 0.0 - 0.3 mg/dL 0.1 - 0.1    HRCT (05/31/20) - appearance of the lungs is compatible with interstitial lung disease, which appears mildly progressive compared to the prior examination, with a spectrum of findings considered probable usual interstitial pneumonia (UIP) per current ATS guidelines  Assessment and Plan  Ofev Medication Management Thoroughly counseled patient on the  efficacy, mechanism of action, dosing, administration, adverse effects, and monitoring parameters of Ofev. Patient verbalized understanding. Patient education handout provided.   Goals of Therapy: Will not stop or reverse the progression of ILD. It will slow the progression of ILD.  Inhibits tyrosine kinase inhibitors which slow the fibrosis/progression of ILD -Significant reduction in the rate of disease progression was observed after treatment (61.1% [before] vs 33.3% [after], P?=?0.008) over 42 weeks.  Dosing: 100 mg (one capsule) by mouth twice daily (approx 12 hours apart). Discussed taking with food approximately 12 hours apart. Discussed that capsule should not be crushed or split.  Adverse Effects: Nausea, vomiting, diarrhea (2 in 3 patients) appetite loss, weight  loss - management of diarrhea with loperamide discussed including max use of 48 hours and max of 8 capsules per day. Abdominal pain (up to 1 in 5 patients) Nasopharyngitis (13%), UTI (6%) Risk of thrombosis (3%) and acute MI (2%) Hypertension (5%) Dizziness Fatigue (10%)  Monitoring: Monitor for diarrhea, nausea and vomiting, GI perforation, hepatotoxicity  Monitor LFTs - baseline, monthly for first 6 months, then every 3 months routinely CBC w differential at baseline and every 3 months routinely  Access: Approval of Ofev through: patient assistance Rx sent to: [facility] for Ofev: [phone]  Medication Reconciliation A drug regimen assessment was performed, including review of allergies, interactions, disease-state management, dosing and immunization history. Medications were reviewed with the patient, including name, instructions, indication, goals of therapy, potential side effects, importance of adherence, and safe use.  Anticoagulant use: Yes - Eliquis -   Immunizations Patient is indicated for the influenzae, pneumonia, and shingles vaccinations. Patient has received 3 COVID19 vaccines. She is UTD on  influenza, pneumonia, and shingles vaccines  This appointment required 60 minutes of patient care (this includes precharting, chart review, review of results, face-to-face care, etc.).  Thank you for involving pharmacy to assist in providing this patient's care.   Knox Saliva, PharmD, MPH, BCPS Clinical Pharmacist (Rheumatology and Pulmonology)

## 2021-08-21 NOTE — Telephone Encounter (Signed)
Received a fax from  [facility] regarding an approval for [place] patient assistance from 08/17/2021 to 12/01/2021.   Phone number: [phone]

## 2021-08-24 ENCOUNTER — Telehealth: Payer: Self-pay | Admitting: Internal Medicine

## 2021-08-24 ENCOUNTER — Telehealth: Payer: Self-pay | Admitting: Cardiovascular Disease

## 2021-08-24 NOTE — Telephone Encounter (Signed)
Returned call to patient-reports having IPF and Dr. Chase Caller is recommending Ofev and she is concerned about starting this and her cardiac history.   She understands there are pros/cons and would like to discuss with Dr. Sallyanne Kuster.    She is okay scheduling an appt to discuss if needed.    Routed to MD to review.

## 2021-08-24 NOTE — Telephone Encounter (Signed)
ATC, left VM

## 2021-08-24 NOTE — Telephone Encounter (Signed)
Pt states that Dr. Brand Males MD Pulmonary Physician  [phone] advised pt to start on a new medicine called OFEV but pt would like to speak with Dr. Timmie Foerster before she starts the medicine. Please advise pt further

## 2021-08-27 NOTE — Telephone Encounter (Signed)
LMTCB

## 2021-08-28 DIAGNOSIS — L820 Inflamed seborrheic keratosis: Secondary | ICD-10-CM | POA: Diagnosis not present

## 2021-08-28 NOTE — Telephone Encounter (Signed)
Patient is following up, again requesting advisement from Dr. Sallyanne Kuster regarding new medication. Please return call when able.

## 2021-08-28 NOTE — Telephone Encounter (Signed)
Called and spoke with patient who is calling to let us know that she was approved for [facility] and is not sure what's next. Asked her if she has heard from anyone from our pharmacy team or the pharmacist [facility] she stated that she does not recall if she has. Advised her I would send message to them to let them know she was approved and that either someone from their team would reach out to them or we would call her back and let her know the pharmacy recommendations.   Please advise

## 2021-08-28 NOTE — Telephone Encounter (Signed)
Returned call to patient who is awaiting response from MD regarding the OFEV medication. Advised patient that Dr. Loletha Grayer is out of the office this week but that I could forward to our PharmD department to have them review the medication and patient history. Patient is agreeable to this. Advised this has been sent and that someone would be back in touch. Patient verbalized understanding.

## 2021-08-29 NOTE — Telephone Encounter (Signed)
The patient has been made aware. She will wait for Dr. Victorino December return for any other recommendations.

## 2021-08-29 NOTE — Telephone Encounter (Signed)
There is potential concern with the Eliquis and OFEV.  OFEV can increase risk of bleeding, although through a completely different mechanism than Eliquis.  The problem is the combined bleed risk with both meds.  We don't want her to be without potentially beneficial medications and it's not an absolute contra-indication to take both, so until Dr. Sallyanne Kuster can comment, I would just suggest that she be aware of bleed risk.

## 2021-08-30 NOTE — Telephone Encounter (Signed)
Called [facility], they have not been able to get in touch with patient to setup shipment. Called patient, she states she spoke to a nurse and set-up a Zoom call to discuss the medication later today, but they did not say anything about when she would get her medication. This sounds like she was confused with the [facility] [employer] program. Conferenced patient with [facility] and scheduled her 1st shipment of 3 month supply to deliver Monday, 09/03/21.

## 2021-09-03 ENCOUNTER — Telehealth: Payer: Self-pay | Admitting: Cardiovascular Disease

## 2021-09-03 NOTE — Telephone Encounter (Signed)
Patient called and wanted to get Dr. Sallyanne Kuster opinion on if she should take an experimental drug that has been prescribed to her. Please return phone call

## 2021-09-03 NOTE — Telephone Encounter (Signed)
Returned call to patient who was calling to see if she could get Dr. Victorino December recommendations on her taking Ofvev. Patient states that she has received the medication but is hesitant to start it until she speaks with Dr. Loletha Grayer. Advised patient that he is out of office at this time and that as soon as he returns someone will be in touch. Patient verbalized understanding.

## 2021-09-07 NOTE — Telephone Encounter (Signed)
Patient made aware and verbalized her understanding.

## 2021-09-10 DIAGNOSIS — R3989 Other symptoms and signs involving the genitourinary system: Secondary | ICD-10-CM | POA: Diagnosis not present

## 2021-09-10 DIAGNOSIS — J9611 Chronic respiratory failure with hypoxia: Secondary | ICD-10-CM | POA: Diagnosis not present

## 2021-09-10 DIAGNOSIS — F419 Anxiety disorder, unspecified: Secondary | ICD-10-CM | POA: Diagnosis not present

## 2021-09-10 DIAGNOSIS — J849 Interstitial pulmonary disease, unspecified: Secondary | ICD-10-CM | POA: Diagnosis not present

## 2021-09-11 DIAGNOSIS — R3989 Other symptoms and signs involving the genitourinary system: Secondary | ICD-10-CM | POA: Diagnosis not present

## 2021-09-17 ENCOUNTER — Telehealth: Payer: Self-pay | Admitting: Internal Medicine

## 2021-09-17 NOTE — Telephone Encounter (Signed)
I have called the pt and she stated that she feels that the ofev has caused her to have some bleeding.  She stated that she has had 2 BM's this morning and she has had some pain in her lower abdomen.   She stated that she has not felt very good today as well.  She said that she had her second BM a little bit ago and she cleaned herself up and didn't notice anything until she stood up and a drop of bright red blood fell on the floor.  She has not had any more bleeding since that time.  She did not notice any blood in the toilet.  She has not had any other symptoms from the ofev.  Denies any nausea/vomiting.  She is on eliquis as well but has not taken it today.    TP please advise since MR is not in the office today. Thanks

## 2021-09-17 NOTE — Telephone Encounter (Signed)
Called and spoke with pt who stated she started taking the [place] 09/13/21.  Stated to her the recs per TP and she verbalized understanding. Pt has been scheduled for a visit with TP tomorrow 10/18 at 3pm. Nothing further needed.

## 2021-09-17 NOTE — Telephone Encounter (Signed)
How long has she been on OFEV ?   REC :  Definitely Hold OFEV .  Would hold Eliquis until seen in office tomorrow.   Will need OV with labs , can set up with Volanda Napoleon NP tomorrow .   Also will need to be seen by PCP as will need workup for bloody stool  Will need to let Cardiology know that she has bleeding on Eliquis .   If bleeding recurs will need to to to ER as this is an emergency  Please contact office for sooner follow up if symptoms do not improve or worsen or seek emergency care

## 2021-09-18 ENCOUNTER — Other Ambulatory Visit: Payer: Self-pay

## 2021-09-18 ENCOUNTER — Ambulatory Visit (INDEPENDENT_AMBULATORY_CARE_PROVIDER_SITE_OTHER): Payer: Medicare Other | Admitting: Primary Care

## 2021-09-18 ENCOUNTER — Encounter: Payer: Self-pay | Admitting: Primary Care

## 2021-09-18 ENCOUNTER — Telehealth: Payer: Self-pay | Admitting: Primary Care

## 2021-09-18 VITALS — BP 118/88 | HR 95 | Temp 97.4°F | Ht 67.0 in | Wt 276.8 lb

## 2021-09-18 DIAGNOSIS — K625 Hemorrhage of anus and rectum: Secondary | ICD-10-CM | POA: Diagnosis not present

## 2021-09-18 DIAGNOSIS — J8410 Pulmonary fibrosis, unspecified: Secondary | ICD-10-CM

## 2021-09-18 DIAGNOSIS — J9611 Chronic respiratory failure with hypoxia: Secondary | ICD-10-CM

## 2021-09-18 DIAGNOSIS — J84112 Idiopathic pulmonary fibrosis: Secondary | ICD-10-CM

## 2021-09-18 NOTE — Assessment & Plan Note (Addendum)
-  Patient had two occurrences of rectal bleeding with associated lower abdominal discomfort after starting antifibrotic medication. She held blood thinner and has stopped OFEV. Bleeding has resolved. Ok to resume Eliquis tomorrow, if rectal bleeding resumes advised her to contact PCP for additional testing and Cardiology for blood thinner recommendations. Checking CBC and CMET today.

## 2021-09-18 NOTE — Assessment & Plan Note (Addendum)
-  Started OFEV 10/13 but stopped 10/17 d/t GIB and lower abdominal pain. She was previously intolerant to Esbriet d/t extreme fatigue. Advised her to remain off anti-fibrotics until follow-up with Dr. Chase Caller next week. We discussed clinical research trials as treatment option for IPF, patient is open to screening.

## 2021-09-18 NOTE — Patient Instructions (Addendum)
Recommendations: - Stop OFEV - Consider clinical trials with Pulmonix (Dr. Chase Caller) for treatment of ILD as care option. TETON study has less side effects. Inhaled treprostinil is used for the treatment for IPF which promotes vasodilation of pulmonary vascular beds and inhibits platelet aggregation. It also has anti-fibrotic properties.   - Resume blood thinner tomorrow. Please notify Cardiology if bleeding restarts on blood thinner - Call PCP if rectal bleeding starts again and ask for [place] stool card  - Labs today  Orders: - CBC and CMET (ordered) - Over night oximetry on CPAP only (ordered)  Follow-up: - Keep apt on 10/26 with Dr. Chase Caller

## 2021-09-18 NOTE — Progress Notes (Signed)
_0  ID: [name] [name], female    DOB: Apr 26, 1941, 80 y.o.   MRN: [id number]  Chief Complaint  Patient presents with   Follow-up    Pt. Says she's had some bleeding.     Referring provider: [name], MD  HPI: 80 year old female former smoker quit in 1971.  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 Patient of Dr. Chase [name], last seen by pulmonary NP on 04/10/21.   Previous [facility] pulmonary encounter: 04/10/2021 follow-up; Bronchitis, Pulmonary fibrosis Patient returns for a follow-up visit, patient was seen last week for cough, congestion, shortness of breath and chills.  COVID-19 testing was negative.  Patient had been fully vaccinated for COVID-19.  She had no increased oxygen demands.  Patient was given doxycycline for 7 days and prednisone 20 mg for 5 days. Patient complains ongoing cough and congestion , mucus remains green on/off. No fever.  Eating well , no n/v.d. No hemoptysis , orthopnea or edema.  Called in over the weekend and was called in steroid burst. Currently on Prednisone 47m daily .  Chest xray showed chronic changes with no acute process.  [name].   04/17/2021 Patient presents today follow-up with PFTs. She did not have PFTs done today because she is still not feeling well. She completed course of doxycycline and has two days left of prednisone. Reports "cold symptoms" described as sinus drainage and cough. Sputum has not real purulence. Energy level is low. States that she feels her head is not right. She is using Flonase ans Claritin. She is short of breath. She completed doxycycline twice with no improvement. She is taking mucinex twice a day. She has not used albuterol rescue inhaler. Her ex husband died two weeks ago, she found out two days ago. Denies depressed feelings.    05/15/2021 -  Dr. RChase CallerType of visit:  Telephone/Video [name].o. - changed face to face visit to telephone visit due to > 69F heat. Wantd to know PFT resuts June 2022 copared to sept 2021. FVC down 1.6%, DLCO down 2.1% (compared to y  2years ago FVC declined 7.7%). On Esbeit since Dec 2021/Jan 2022.  Says Dr C sarted on her ditliazem 04/18/21 and after that esbriet became more intolerable. She had constipationa and retain fluids.  So, she stopped all her medications except  eliquis and anti depressant and anti histamine. This means she stopped esbriet approx a week ago. Says that for several days still not feel better though better today. She is unsure if today is a good day v bad day. But overall feels A Fib is acting up. She wants to restarrtrt esbriet after good w wash out.   She is now using o2 at night but feels is not helping energy levels at day time. I Advised she could return it and then she said is actually helping her and does not want to return it.    08/14/21- Dr. RChase CallerChief Complaint  Patient presents with   Follow-up    Pt states she has been doing okay since last visit. Still becomes SOB with exertion.   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 Max dose 2 pilll tid since Dec 02, 2020 -> intermittent start/stop summer 2022 -> stopped aug 2022  - Overnight pulse oximetry done on 03/12/2021 shows time spent less than equal to 88% is 40.3 minutes.  Average pulse was 60/min.  Time spent in bradycardia 72.5%. Start 2 L nasal cannula at night  Multiple Lung Nodules 60m- stable 213-Feb-2018-> July 2021 Alpha-1 MZ but does not have emphysema on CT 22021/02/13   [Daughter died from alpha-1 CC] Obesity  Diastolic dysfunction Atrial fibrillation on Eliquis Obstructive sleep apnea on CPAP without oxygen COVID-19 incidental 12/28/2020 - likely Omicron4  [name].o. -returns for follow-up.  Since her last visit because she was having calls with side effects from pirfenidone.  She stopped it.  She says after that she started feeling better.  Despite this she is reporting progressive decline in dyspnea.  Dyspnea score is listed below.  She uses oxygen with exertion as needed.  I at night she uses CPAP without oxygen.  She is frustrated with pirfenidone.  She asked about the alternative nintedanib.  She recollects that there constraints with it in the setting of heart disease and anticoagulation.  Explained that one of the study showed 0.5% increase incidence and MI with nintedanib.  But not the other studies.  Explained that surgical mechanism of anticoagulation but clinically patient have tolerated.  Explained about GI side effects particularly diarrhea.  She is nervous and hesitant but finally we took a shared decision making that she would start it at a lower dose given the fact it can be beneficial in reducing risk of progression.  We also discussed various aspects of clinical trials documented below.  She is interested in this as long as it is a medication with a much better side effect profile.  But at this point in time we took a shared decision making to go with standard of care therapy first.  She will have the flu shot today.  She is very nervous about getting mRNA COVID booster because of the side effect profile.  She had significant side effects apparently.  She also had omicron COVID in January 2022 and she is wondering about natural immunity.  However she is aware is COVID everywhere currently.  We took a shared decision making to check IgG antibody levels and adjust the risk threshold accordingly.  09/18/2021- Interim hx  Patient presents  today for acute visit for medication reaction to OFEV. Patient called our office yesterday with reports of rectal bleeding and abdominal pain since starting 09/17/21. She is newly on OFEV since 10/13 and is also on Eliquis. Our office advised her to stop OFEV. Rectal bleeding has since resolved, she still has some slight lower abdomen discomfort described as "bloating".  Breathing wise she is stable. She uses POC 2L with exertion as needed.  She is on oxygen at night with her CPAP. Denies f/c/s, diarrhea, nausea or vomitting.     SYMPTOM SCALE - ILD 07/21/2019   08/10/2020 262# 09/26/2020 Last Weight  Most recent update: 09/26/2020  1:42 PM      Weight  120.9 kg (266 lb 9.6 oz)                     11/09/2020 274# 01/23/2021 279# 08/14/2021 275# 09/18/2021 Started  OFEV 10/13 but stoped 10/17 d/t GIB   O2 use ra ra ra o2 with ex 02 with ex   O2 with exertoin  Shortness of Breath 0 -> 5 scale with 5 being worst (score 6 If unable to do)             At rest 0 0   5 0 4 0  Simple tasks - showers, clothes change, eating, shaving _0 Household (dishes, doing bed, laundry) _1 Shopping _2 4.5  Walking level at own pace _3 Walking up Stairs _4 Total (40 - 48) Dyspnea Score _5 23.5  How bad is your cough? 1 1-_6 How bad is your fatigue _7 nausea   0 0 0 0 0 0  vomit   0 0 0 0 0 0  diarrhea   0 0 0 1 0 0.5  axiety   2 1 0 0 0.5 0.5  depression   2 1._8 Simple office walk 185 feet x  3 laps goal with forehead probe 07/21/2019   08/10/2020   01/23/2021   09/18/2021   O2 used ra ra ra RA  Number laps completed 3 laps _9 Comments about pace modertae with walker Slow pace Sow pace Slow pace  Resting Pulse Ox/HR 97% and 78/min 100% and 74/min 94%and HR 92 96% RA and HR 80  Final Pulse Ox/HR 93% and 109/min 77% and 85/min 90 % and HR 100 90% and HR 125  Desaturated </= 88% no no no No   Desaturated <= 3% points Yes, 4 Yes, 13 points Yes, 4 piints Yes, 6 points  Got Tachycardic >/= 90/min you have pulmonary fibrosis otherwise call interstitial lung disease yes no yes Yes  Symptoms at end of test dyspnea Mild dyspnea Dyspnea throughtou Dyspnea   Miscellaneous comments x Corrected with 2LNC and got    X        TEST/EVENTS :  HRCT June 2021 showed interstitial lung disease, 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  Small pulmonary nodules measuring 5 mm or less stable dating back to 2018 consistent with a benign etiology.   Allergies  Allergen Reactions   Ancef [Cefazolin] Hives and Itching   Pseudoephedrine Hcl     Other reaction(s): palpitations (moderate)   Ergotamine    Other Other (See Comments)   Pentobarbital    Pirfenidone    Caffeine Palpitations    Immunization History  Administered Date(s) Administered   Fluad Quad(high Dose 65+) 08/14/2021   Influenza Split 10/09/2009, 09/19/2010, 09/13/2020   Influenza Whole 09/23/2008   Influenza, High Dose Seasonal PF 09/13/2018, 09/27/2019, 09/26/2020   Influenza,inj,quad, With Preservative 09/04/2017   Influenza-Unspecified 09/17/2014, 09/09/2016, 09/04/2017   Moderna Sars-Covid-2 Vaccination 12/06/2019, 01/03/2020, 10/16/2020   Pneumococcal Conjugate-13 09/29/2014, 10/16/2015   Pneumococcal Polysaccharide-23 04/06/2009, 10/02/2017   Pneumococcal-Unspecified 10/01/2014, 10/02/2017   Td 04/05/1999   Tdap 04/06/2009   Zoster Recombinat (Shingrix) 09/29/2018, 01/19/2019   Zoster, Live 12/02/2008, 09/29/2018, 01/19/2019    Past Medical History:  Diagnosis Date   Alpha-1-antitrypsin deficiency carrier    Arthritis    "all over"  Atherosclerosis of aorta (HCC)    Atrial fibrillation ([place])    Back pain    Complication of anesthesia    "I woke up during 2 different procedures" (70/05/2375)   Diastolic dysfunction    Frequent UTI    GERD (gastroesophageal reflux disease)    Hypertension     Hypertriglyceridemia    Insulin resistance    Long term current use of anticoagulant    Multiple lung nodules    Muscular deconditioning    MVP (mitral valve prolapse)    Obesity    OSA on CPAP    Osteoarthritis    Osteopenia    unspecified location   Pacemaker    PAF (paroxysmal atrial fibrillation) (HCC)    Positive ANA (antinuclear antibody)    Presbyesophagus    Presence of permanent cardiac pacemaker    Primary osteoarthritis of both hands    neck and spine and hips   Pulmonary fibrosis ([place])    followed by pulmonolgy   PVC's (premature ventricular contractions)    Shortness of breath    Sinus arrest 10/2014   s/p Medtronic Advisa model A2DR01 serial number [id number]   Sleep apnea    Stroke ([place]) 01/2014   denies deficits on 10/04/2014   Tachy-brady syndrome ([place]) 10/2014   TIA (transient ischemic attack)    Vitamin D deficiency     Tobacco History: Social History   Tobacco Use  Smoking Status Former   Packs/day: 2.00   Years: 18.00   Pack years: 36.00   Types: Cigarettes   Quit date: 07/18/1970   Years since quitting: 51.2  Smokeless Tobacco Never  Tobacco Comments   "quit smoking in 1971"   Counseling given: Not Answered Tobacco comments: "quit smoking in 1971"   Outpatient Medications Prior to Visit  Medication Sig Dispense Refill   albuterol (VENTOLIN HFA) 108 (90 Base) MCG/ACT inhaler Inhale 1-2 puffs into the lungs every 6 (six) hours as needed for wheezing or shortness of breath. (Patient not taking: No sig reported) 8 g 1   apixaban (ELIQUIS) 5 MG TABS tablet Take 1 tablet (5 mg total) by mouth 2 (two) times daily. 180 tablet 3   B Complex-C (SUPER B COMPLEX PO) Take 1 tablet by mouth daily.     Biotin 5000 MCG CAPS Take 5,000 mcg by mouth daily.      Calcium Carbonate-Vit D-Min (CALCIUM 1200 PO) Take 1,200 mg by mouth daily.      cholecalciferol (VITAMIN D) 1000 UNITS tablet Take 6,000 Units by mouth daily.      Co-Enzyme Q10 100 MG CAPS Take 100  mg by mouth daily.      diazepam (VALIUM) 5 MG tablet TAKE 1 TABLET EACH DAY. (Patient taking differently: Take 5 mg by mouth daily as needed for anxiety. TAKE 1 TABLET EACH DAY.) 30 tablet 0   diltiazem (DILACOR XR) 120 MG 24 hr capsule Take 1 capsule (120 mg total) by mouth daily. (Patient not taking: No sig reported) 30 capsule 1   fluticasone (FLONASE) 50 MCG/ACT nasal spray Place 2 sprays into both nostrils as needed for allergies or rhinitis.     folic acid (FOLVITE) 607 MCG tablet Take 400 mcg by mouth daily.     furosemide (LASIX) 40 MG tablet Take 1 tablet (40 mg total) by mouth daily. Take for three days 3 tablet 0   glucosamine-chondroitin 500-400 MG tablet Take 1 tablet by mouth 2 (two) times daily.      loratadine (CLARITIN) 10 MG  tablet Take 10 mg by mouth daily.     MAGNESIUM CITRATE PO Take 400 mg by mouth daily. 1 tab daily     Multiple Vitamins-Minerals (ZINC PO)      Nintedanib (OFEV) 100 MG CAPS Take 1 capsule (100 mg total) by mouth 2 (two) times daily. (Patient not taking: Reported on 09/18/2021) 180 capsule 1   nitroGLYCERIN (NITROSTAT) 0.4 MG SL tablet Place 1 tablet (0.4 mg total) under the tongue every 5 (five) minutes as needed for chest pain. (Patient not taking: No sig reported) 25 tablet 1   omeprazole (PRILOSEC) 40 MG capsule Take 1 capsule by mouth daily.     Resveratrol 250 MG CAPS Take 250 mg by mouth daily.     sertraline (ZOLOFT) 100 MG tablet TAKE 1 TABLET EACH DAY. 90 tablet 0   TURMERIC PO Take 1,000 mg by mouth daily.     No facility-administered medications prior to visit.    Review of Systems  Review of Systems  Constitutional:  Positive for fatigue.  HENT: Negative.    Respiratory:  Positive for cough and shortness of breath. Negative for chest tightness and wheezing.   Cardiovascular: Negative.     Physical Exam  BP 118/88 (BP Location: Left Arm, Patient Position: Sitting, Cuff Size: Large)   Pulse 95   Temp (!) 97.4 F (36.3 C) (Oral)    Ht _0  (1.702 m)   Wt 276 lb 12.8 oz (125.6 kg)   LMP  (LMP Unknown)   SpO2 97%   BMI 43.35 kg/m  Physical Exam Constitutional:      Appearance: Normal appearance.  HENT:     Head: Normocephalic and atraumatic.     Mouth/Throat:     Mouth: Mucous membranes are moist.     Pharynx: Oropharynx is clear.  Cardiovascular:     Rate and Rhythm: Normal rate and regular rhythm.  Pulmonary:     Effort: Pulmonary effort is normal.     Breath sounds: Rales present.     Comments: 2L POC Abdominal:     General: There is no distension.     Palpations: Abdomen is soft.     Tenderness: There is abdominal tenderness.  Musculoskeletal:        General: Normal range of motion.  Skin:    General: Skin is warm and dry.  Neurological:     General: No focal deficit present.     Mental Status: She is alert and oriented to person, place, and time. Mental status is at baseline.  Psychiatric:        Mood and Affect: Mood normal.        Behavior: Behavior normal.        Thought Content: Thought content normal.        Judgment: Judgment normal.     Lab Results:  CBC    Component Value Date/Time   WBC 20.1 Repeated and verified X2. (HH) 04/17/2021 1520   RBC 5.22 (H) 04/17/2021 1520   HGB 14.7 04/17/2021 1520   HGB 14.8 01/04/2021 1445   HCT 44.6 04/17/2021 1520   HCT 43.3 01/04/2021 1445   PLT 267.0 04/17/2021 1520   PLT 283 01/04/2021 1445   MCV 85.5 04/17/2021 1520   MCV 85 01/04/2021 1445   MCH 29.0 01/04/2021 1445   MCH 29.4 07/28/2020 0913   MCHC 32.9 04/17/2021 1520   RDW 13.6 04/17/2021 1520   RDW 12.7 01/04/2021 1445   LYMPHSABS 2.7 04/17/2021 1520   LYMPHSABS 2.0  09/17/2017 1131   MONOABS 1.3 (H) 04/17/2021 1520   EOSABS 0.1 04/17/2021 1520   EOSABS 0.2 09/17/2017 1131   BASOSABS 0.1 04/17/2021 1520   BASOSABS 0.0 09/17/2017 1131    BMET    Component Value Date/Time   NA 135 04/17/2021 1520   NA 141 01/04/2021 1445   K 4.9 04/17/2021 1520   CL 100 04/17/2021 1520    CO2 29 04/17/2021 1520   GLUCOSE 100 (H) 04/17/2021 1520   BUN 25 (H) 04/17/2021 1520   BUN 24 01/04/2021 1445   CREATININE 1.32 (H) 04/17/2021 1520   CREATININE 1.09 (H) 07/28/2020 0913   CALCIUM 9.1 04/17/2021 1520   GFRNONAA 51 (L) 01/04/2021 1445   GFRNONAA 48 (L) 07/28/2020 0913   GFRAA 59 (L) 01/04/2021 1445   GFRAA 56 (L) 07/28/2020 0913    BNP No results found for: BNP  ProBNP    Component Value Date/Time   PROBNP 289.0 (H) 04/17/2021 1520    Imaging: No results found.   Assessment & Plan:   Pulmonary fibrosis ([place]), followed by Pulmonolgy - Started OFEV 10/13 but stopped 10/17 d/t GIB and lower abdominal pain. She was previously intolerant to Esbriet d/t extreme fatigue. Advised her to remain off anti-fibrotics until follow-up with Dr. Chase [name] next week. We discussed clinical research trials as treatment option for IPF, patient is open to screening.  Chronic respiratory failure with hypoxia (HCC) - Patient uses 2L oxygen with exertion as needed and at night with CPAP - No oxygen desaturations were noted on ambulatory walk test today in office.  Resting O2 96% RA; final pulse ox after 2 laps 90% RA - Recommend checking ONO on CPAP     Rectal bleeding - Patient had two occurrences of rectal bleeding with associated lower abdominal discomfort after starting antifibrotic medication. She held blood thinner and has stopped OFEV. Bleeding has resolved. Ok to resume Eliquis tomorrow, if rectal bleeding resumes advised her to contact PCP for additional testing and Cardiology for blood thinner recommendations. Checking CBC and CMET today.   Martyn Ehrich, NP 09/18/2021

## 2021-09-18 NOTE — Telephone Encounter (Signed)
Patient has been intolerant to Esbriet and Ofev. I started discussion about use of clinical trials for treatment of patients with IPF, she is open to screening. She will be seeing Dr. Judd Gaudier on 10/26.

## 2021-09-18 NOTE — Assessment & Plan Note (Addendum)
-  Patient uses 2L oxygen with exertion as needed and at night with CPAP - No oxygen desaturations were noted on ambulatory walk test today in office.  Resting O2 96% RA; final pulse ox after 2 laps 90% RA - Recommend checking ONO on CPAP

## 2021-09-19 DIAGNOSIS — Z124 Encounter for screening for malignant neoplasm of cervix: Secondary | ICD-10-CM | POA: Diagnosis not present

## 2021-09-19 DIAGNOSIS — Z6841 Body Mass Index (BMI) 40.0 and over, adult: Secondary | ICD-10-CM | POA: Diagnosis not present

## 2021-09-19 LAB — COMPREHENSIVE METABOLIC PANEL
ALT: 15 U/L (ref 0–35)
AST: 22 U/L (ref 0–37)
Albumin: 4.1 g/dL (ref 3.5–5.2)
Alkaline Phosphatase: 87 U/L (ref 39–117)
BUN: 20 mg/dL (ref 6–23)
CO2: 26 mEq/L (ref 19–32)
Calcium: 9.5 mg/dL (ref 8.4–10.5)
Chloride: 104 mEq/L (ref 96–112)
Creatinine, Ser: 1.08 mg/dL (ref 0.40–1.20)
GFR: 48.38 mL/min — ABNORMAL LOW (ref >60.00)
Glucose, Bld: 97 mg/dL (ref 70–99)
Potassium: 4.5 mEq/L (ref 3.5–5.1)
Sodium: 139 mEq/L (ref 135–145)
Total Bilirubin: 0.4 mg/dL (ref 0.2–1.2)
Total Protein: 7.1 g/dL (ref 6.0–8.3)

## 2021-09-19 LAB — CBC
HCT: 44 % (ref 36.0–46.0)
Hemoglobin: 14.4 g/dL (ref 12.0–15.0)
MCHC: 32.8 g/dL (ref 30.0–36.0)
MCV: 85.1 fl (ref 78.0–100.0)
Platelets: 272 10*3/uL (ref 150.0–400.0)
RBC: 5.17 Mil/uL — ABNORMAL HIGH (ref 3.87–5.11)
RDW: 13.2 % (ref 11.5–15.5)
WBC: 10.9 10*3/uL — ABNORMAL HIGH (ref 4.0–10.5)

## 2021-09-19 NOTE — Progress Notes (Signed)
Liver function and Hgb were normal. The rest of her labs were baseline for her

## 2021-09-19 NOTE — Telephone Encounter (Signed)
Thanks and noted 

## 2021-09-20 ENCOUNTER — Encounter (HOSPITAL_COMMUNITY): Payer: Self-pay | Admitting: *Deleted

## 2021-09-20 NOTE — Progress Notes (Signed)
Received referral from Dr. Chase Caller for this pt to participate in pulmonary rehab with the diagnosis of IPF.  Clinical review of pt follow up appt on 09/20/21 with Geraldo Pitter NP Pulmonary office note.  Pt with Covid Risk Score - 6. Pt appropriate for scheduling for Pulmonary rehab when able as there is a wait list.  Will forward to support staff for scheduling and verification of insurance eligibility/benefits with pt consent. Cherre Huger, BSN Cardiac and Training and development officer

## 2021-09-26 ENCOUNTER — Ambulatory Visit (INDEPENDENT_AMBULATORY_CARE_PROVIDER_SITE_OTHER): Payer: Medicare Other | Admitting: Internal Medicine

## 2021-09-26 ENCOUNTER — Encounter: Payer: Self-pay | Admitting: Internal Medicine

## 2021-09-26 ENCOUNTER — Other Ambulatory Visit: Payer: Self-pay

## 2021-09-26 VITALS — BP 128/72 | HR 71 | Temp 98.0°F | Ht 67.0 in | Wt 276.0 lb

## 2021-09-26 DIAGNOSIS — J84112 Idiopathic pulmonary fibrosis: Secondary | ICD-10-CM

## 2021-09-26 DIAGNOSIS — K625 Hemorrhage of anus and rectum: Secondary | ICD-10-CM | POA: Diagnosis not present

## 2021-09-26 DIAGNOSIS — Z5181 Encounter for therapeutic drug level monitoring: Secondary | ICD-10-CM | POA: Diagnosis not present

## 2021-09-26 NOTE — Progress Notes (Signed)
Spirometry/DLCO performed today.

## 2021-09-26 NOTE — Patient Instructions (Addendum)
IPF (idiopathic pulmonary fibrosis) (HCC) Medication monitoring encounter  - several mild side effects with ofev but also dealing with progressive disease  Plan  - shared decision making  - rechallenge with ofev again  - start 113m once daily for 1 week and then go to 1032mtwice daily  - any side effects call usKoreatraight away - if this does not work, then can look at clinical trials - take consent form for [address] study  Rectal bleeding  - ? Due to ofev and eliquis together - ? Any other GI issue  Plan  - visit your GI doc  Followup  2 weeks or sooner if needed - can be phoe or video visit

## 2021-09-26 NOTE — Progress Notes (Signed)
_0  ID: [name] [name], female    DOB: 1941/05/12, 80 y.o.   MRN: [id number]  Chief Complaint  Patient presents with   Follow-up    Pt. Says she's had some bleeding.     Referring provider: [name], MD  HPI: 80 year old female former smoker quit in 1971.  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 Patient of Dr. Chase [name], last seen by pulmonary NP on 04/10/21.   Previous [facility] pulmonary encounter: 04/10/2021 follow-up; Bronchitis, Pulmonary fibrosis Patient returns for a follow-up visit, patient was seen last week for cough, congestion, shortness of breath and chills.  COVID-19 testing was negative.  Patient had been fully vaccinated for COVID-19.  She had no increased oxygen demands.  Patient was given doxycycline for 7 days and prednisone 20 mg for 5 days. Patient complains ongoing cough and congestion , mucus remains green on/off. No fever.  Eating well , no n/v.d. No hemoptysis , orthopnea or edema.  Called in over the weekend and was called in steroid burst. Currently on Prednisone 68m daily .  Chest xray showed chronic changes with no acute process.  [place].   04/17/2021 Patient presents today follow-up with PFTs. She did not have PFTs done today because she is still not feeling well. She completed course of doxycycline and has two days left of prednisone. Reports "cold symptoms" described as sinus drainage and cough. Sputum has not real purulence. Energy level is low. States that she feels her head is not right. She is using Flonase ans Claritin. She is short of breath. She completed doxycycline twice with no improvement. She is taking mucinex twice a day. She has not used albuterol rescue inhaler. Her ex husband died two weeks ago, she found out two days ago. Denies depressed feelings.    05/15/2021 -  Dr. RChase CallerType of  visit: Telephone/Video [name]80y.o. - changed face to face visit to telephone visit due to > 87F heat. Wantd to know PFT resuts June 2022 copared to sept 2021. FVC down 1.6%, DLCO down 2.1% (compared to y  2years ago FVC declined 7.7%). On Esbeit since Dec 2021/Jan 2022.  Says Dr C sarted on her ditliazem 04/18/21 and after that esbriet became more intolerable. She had constipationa and retain fluids.  So, she stopped all her medications except  eliquis and anti depressant and anti histamine. This means she stopped esbriet approx a week ago. Says that for several days still not feel better though better today. She is unsure if today is a good day v bad day. But overall feels A Fib is acting up. She wants to restarrtrt esbriet after good w wash out.   She is now using o2 at night but feels is not helping energy levels at day time. I Advised she could return it and then she said is actually helping her and does not want to return it.    08/14/21- Dr. RChase CallerChief Complaint  Patient presents with   Follow-up    Pt states she has been doing okay since last visit. Still becomes SOB with exertion.     [name]80y.o. -returns for follow-up.  Since her last visit because she was having calls with side effects from pirfenidone.  She stopped it.  She says after that she started feeling better.  Despite this she is reporting progressive  decline in dyspnea.  Dyspnea score is listed below.  She uses oxygen with exertion as needed.  I at night she uses CPAP without oxygen.  She is frustrated with pirfenidone.  She asked about the alternative nintedanib.  She recollects that there constraints with it in the setting of heart disease and anticoagulation.  Explained that one of the study showed 0.5% increase incidence and MI with nintedanib.  But not the other studies.  Explained that surgical mechanism of anticoagulation but clinically patient have tolerated.  Explained about GI side effects  particularly diarrhea.  She is nervous and hesitant but finally we took a shared decision making that she would start it at a lower dose given the fact it can be beneficial in reducing risk of progression.  We also discussed various aspects of clinical trials documented below.  She is interested in this as long as it is a medication with a much better side effect profile.  But at this point in time we took a shared decision making to go with standard of care therapy first.  She will have the flu shot today.  She is very nervous about getting mRNA COVID booster because of the side effect profile.  She had significant side effects apparently.  She also had omicron COVID in January 2022 and she is wondering about natural immunity.  However she is aware is COVID everywhere currently.  We took a shared decision making to check IgG antibody levels and adjust the risk threshold accordingly.  09/18/2021- Interim hx  Patient presents today for acute visit for medication reaction to OFEV. Patient called our office yesterday with reports of rectal bleeding and abdominal pain since starting 09/17/21. She is newly on OFEV since 10/13 and is also on Eliquis. Our office advised her to stop OFEV. Rectal bleeding has since resolved, she still has some slight lower abdomen discomfort described as "bloating".  Breathing wise she is stable. She uses POC 2L with exertion as needed.  She is on oxygen at night with her CPAP. Denies f/c/s, diarrhea, nausea or vomitting.     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  Small pulmonary nodules measuring 5 mm or less stable dating back to 2018 consistent with a benign etiology.  OV 09/26/2021  Subjective:  [name], female , DOB: 1941/08/08 , age 80 y.o. , MRN: [id number] , ADDRESS: [address] [place] [address] PCP [name] Lass, MD Patient Care Team: [name] Lass, MD as  PCP - General (Family Medicine) [name] Klein, MD as PCP - Cardiology (Cardiology) [name] Fus, MD as Consulting Physician (Obstetrics and Gynecology) Richmond Campbell, MD as Consulting Physician (Gastroenterology) Croitoru, Dani Gobble, MD as Consulting Physician (Cardiology) Kathie Rhodes, MD (Inactive) as Consulting Physician (Urology) Danella Sensing, MD as Consulting Physician (Dermatology) Brand Males, MD as Consulting Physician (Pulmonary Disease) Bo Merino, MD as Consulting Physician (Rheumatology) Barbaraann Cao, OD as Referring Physician (Optometry) Berenice Primas, MD as Referring Physician (Orthopedic Surgery)  This Provider for this visit: Treatment Team:  Attending Provider: Brand Males, MD    09/26/2021 -   Chief Complaint  Patient presents with   Follow-up    PFT performed today.  Pt states she has been doing okay since last visit.   Follow-up IPF   - sept 09, 2021 update - IPF dx givien: age greater than 67, only trace positive ANAdescription of small hiatal hernia on CT scan, the  type of pattern on the CT scan called probable UIP, autoimmune antibodies negative last year    - last CT July 2021   -last PFT sept 201  - portable o2 since sept 2021  -esbriet start end sept 2021 -> stopped 11/02/20 due to fatigue -> rstar 11/09/2020 -. Max dose 2 pilll tid since Dec 02, 2020 -> intermittent start/stop summer 2022 -> stopped aug 2022  - Overnight pulse oximetry done on 03/12/2021 shows time spent less than equal to 88% is 40.3 minutes.  Average pulse was 60/min.  Time spent in bradycardia 72.5%. Start 2 L nasal cannula at night  Multiple Lung Nodules 47m- stable 202/12/18-> July 2021 Alpha-1 MZ but does not have emphysema on CT 202-12-21  [Daughter died from alpha-1 CC] Obesity  Diastolic dysfunction Atrial fibrillation on Eliquis Obstructive sleep apnea on CPAP without oxygen COVID-19 incidental 12/28/2020 - likely Omicron4  HPI BPAISLIE TESSLER89y.o.  -presents ith daughter ? SJeralyn Ruthsmeeting first time. Doing stable from symptom stand point resp wise but PFT down significantly. Took ofev for 5 days and there are aspectso of taking ofev (1026mbid - the lower dose) tht she liked. She felt her urinary stream had better flow (not a known side effect). She had lower appetite (known Se with ofev) but she liked the curb on appetie it due to her obesity. The 5d into she had had mild discomfort in suprapubic area (known SE for abd discmofort) and had 1 drop of painless rectal bleed. So stopped ofev (also on eliquisl bleeding theoertical risk with ofev). After stopping ofev, urinary flow is back to baseline(does not like it), and no mpre rectal bleeding . She never had rectal bleeding before and afer. She again stated and made clear the so called bleeding was "1 drop of blood ont he floor". Associted with ofev intake on background of eliquis and associted with suprapubic discomfor. No dysuria. Dr MaBrien Matess her GI. Last colonoscopy was when she ws 89nd was normal apparently  Discussed  - seeing a GI doc for rectal bleed - she is agreeable   - discussed rechallenge with ofev v joining clinical trial phase 3( to be volunteer + exploit therapeutic advantage with 50% chance of placebo) Discussed other asoects of trials. Explained the 0902/12/1995ephrysu study with IV MAB against CTGF Pamrevulumab v placeo.  See below. She and dauhger want her to do clinical trial but are more enthused about the idea of a ofev rechallenge first. We discussed that rechallenge liketly to cause side effects but anecdotally seems some patients who do not have issues. She is not interested in esbriet rechallenge   1. Scientific Purpose  Clinical research is designed to produce generalizable knowledge and to answer questions about the safety and efficacy of intervention(s) under study in order to determine whether or not they may be useful for the care of future patients.  2. Study  Procedures  Participation in a trial may involve procedures or tests, in addition to the intervention(s) under study, that are intended only or primarily to generate scientific knowledge and that are otherwise not necessary for patient care.   3. Uncertainty  For intervention(s) under study in clinical research, there often is less knowledge and more uncertainty about the risks and benefits to a population of trial participants than there is when a doctor offers a patient standard interventions.   4. Adherence to Protocol  Administration of the intervention(s) under study is typically based on a  strict protocol with defined dose, scheduling, and use or avoidance of concurrent medications, compared to administration of standard interventions.  5. Clinician as Investigator  Clinicians who are in health care settings provide treatment; in a clinical trial setting, they are also investigating safety and efficacy of an intervention. In otherwise your doctor or nurse practitioner can be wearing 2 hats - one as care giver another as Company secretary  6. Patient as Visual merchandiser Subject  Patients participating in research trials are research subjects or volunteers. In other words participating in research is 100% voluntary and at one's own free weill. The decision to participate or not participate will NOT affect patient care and the doctor-patient relationship in any way  7. Financial Conflict of Interest Disclosure  One or more of the investigators of  the research trial might have an investment interest in [employer] the clinical trials practice through which the study is being conducted. Study sponsor pays the practice for the conduct of the study.         SYMPTOM SCALE - ILD 07/21/2019   08/10/2020 262# 09/26/2020 Last Weight  Most recent update: 09/26/2020  1:42 PM      Weight  120.9 kg (266 lb 9.6 oz)                     11/09/2020 274# 01/23/2021 279#  08/14/2021 275# 09/18/2021 Started OFEV 10/13 but stoped 10/17 d/t GIB  09/26/2021 Off ofev. No more "rectal bleed"  O2 use ra ra ra o2 with ex 02 with ex   O2 with exertoin   Shortness of Breath 0 -> 5 scale with 5 being worst (score 6 If unable to do)              At rest 0 0   5 0 4 0 0  Simple tasks - showers, clothes change, eating, shaving _0 3.5  Household (dishes, doing bed, laundry) _1 Shopping _2 4.5 4  Walking level at own pace _3 Walking up Stairs _4 Total (40 - 48) Dyspnea Score _5 23.5 22.5  How bad is your cough? 1 1-_6 How bad is your fatigue _7 3.5  nausea   0 0 0 0 0 0 0  vomit   0 0 0 0 0 0 0  diarrhea   0 0 0 1 0 0.5 0  axiety   2 1 0 0 0.5 0.5 0  depression   2 1._8 0.5-1        Simple office walk 185 feet x  3 laps goal with forehead probe 07/21/2019   08/10/2020   01/23/2021   09/18/2021   O2 used ra ra ra RA  Number laps completed 3 laps _9 Comments about pace modertae with walker Slow pace Sow pace Slow pace  Resting Pulse Ox/HR 97% and 78/min 100% and 74/min 94%and HR 92 96% RA and HR 80  Final Pulse Ox/HR 93% and 109/min 77% and 85/min 90 % and HR 100 90% and HR 125  Desaturated </= 88% no no no No  Desaturated <= 3% points Yes, 4  Yes, 13 points Yes, 4 piints Yes, 6 points  Got Tachycardic >/= 90/min you have pulmonary fibrosis otherwise call interstitial lung disease yes no yes Yes  Symptoms at end of test dyspnea Mild dyspnea Dyspnea throughtou Dyspnea   Miscellaneous comments x Corrected with [facility] and got    X        PFTResults for ELISKA, HAMIL (MRN [id number]) as of 09/26/2021 23:35  Ref. Range 02/24/2017 11:58 07/19/2019 15:20 08/10/2020 10:03 05/07/2021 15:12 09/26/2021   FVC-Pre Latest Units: L 2.58 2.74 2.57 2.53 2.38  FVC-%Pred-Pre Latest Units: % 85 93 84 84 79  Results for MALLORY, SCHAAD (MRN [id number]) as of 09/26/2021 23:35  Ref.  Range 02/24/2017 11:58 07/19/2019 15:20 08/10/2020 10:03 05/07/2021 15:12 09/26/2021   DLCO unc Latest Units: ml/min/mmHg 16.15 16.52 15.83 15.50 9.76  DLCO unc % pred Latest Units: % 59 81 76 74 47    has a past medical history of Alpha-1-antitrypsin deficiency carrier, Arthritis, Atherosclerosis of aorta ([place]), Atrial fibrillation ([place]), Back pain, Complication of anesthesia, Diastolic dysfunction, Frequent UTI, GERD (gastroesophageal reflux disease), Hypertension, Hypertriglyceridemia, Insulin resistance, Long term current use of anticoagulant, Multiple lung nodules, Muscular deconditioning, MVP (mitral valve prolapse), Obesity, OSA on CPAP, Osteoarthritis, Osteopenia, Pacemaker, PAF (paroxysmal atrial fibrillation) ([place]), Positive ANA (antinuclear antibody), Presbyesophagus, Presence of permanent cardiac pacemaker, Primary osteoarthritis of both hands, Pulmonary fibrosis ([place]), PVC's (premature ventricular contractions), Shortness of breath, Sinus arrest (10/2014), Sleep apnea, Stroke ([place]) (01/2014), Tachy-brady syndrome ([place]) (10/2014), TIA (transient ischemic attack), and Vitamin D deficiency.   reports that she quit smoking about 51 years ago. Her smoking use included cigarettes. She has a 36.00 pack-year smoking history. She has never used smokeless tobacco.  Past Surgical History:  Procedure Laterality Date   CARDIOVERSION N/A 09/02/2017   Procedure: CARDIOVERSION;  Surgeon: [name] Klein, MD;  Location: [facility];  Service: Cardiovascular;  Laterality: N/A;   COLONOSCOPY  2019   ESOPHAGEAL DILATION     EXCISION VAGINAL CYST     benign nodules   INSERT / REPLACE / REMOVE PACEMAKER  10/04/2014   Medtronic Advisa model A2DR01 serial number [id number]   LOOP RECORDER EXPLANT N/A 10/04/2014   Procedure: LOOP RECORDER EXPLANT;  Surgeon: [name] Klein, MD;  Location: [facility];  Service: Cardiovascular;  Laterality: N/A;   LOOP RECORDER IMPLANT N/A 04/19/2014   Procedure: LOOP RECORDER  IMPLANT;  Surgeon: [name] Klein, MD;  Location: [facility];  Service: Cardiovascular;  Laterality: N/A;   NM MYOCAR PERF WALL MOTION  12/18/2007   normal   PERMANENT PACEMAKER INSERTION N/A 10/04/2014   Procedure: PERMANENT PACEMAKER INSERTION;  Surgeon: [name] Klein, MD;  Location: [facility];  Service: Cardiovascular;  Laterality: N/A;   TUBAL LIGATION     US ECHOCARDIOGRAPHY  05/15/2010   LA mildly dilated,mild mitral annular ca+, AOV mildly sclerotic, mild asymmetric LVH    Allergies  Allergen Reactions   Ancef [Cefazolin] Hives and Itching   Pseudoephedrine Hcl     Other reaction(s): palpitations (moderate)   Ergotamine    Other Other (See Comments)   Pentobarbital    Pirfenidone    Caffeine Palpitations    Immunization History  Administered Date(s) Administered   Fluad Quad(high Dose 65+) 08/14/2021   Influenza Split 10/09/2009, 09/19/2010, 09/13/2020   Influenza Whole 09/23/2008   Influenza, High Dose Seasonal PF 09/13/2018, 09/27/2019, 09/26/2020   Influenza,inj,quad, With Preservative 09/04/2017   Influenza-Unspecified 09/17/2014, 09/09/2016, 09/04/2017   Moderna Sars-Covid-2 Vaccination 12/06/2019, 01/03/2020, 10/16/2020   Pneumococcal  Conjugate-13 09/29/2014, 10/16/2015   Pneumococcal Polysaccharide-23 04/06/2009, 10/02/2017   Pneumococcal-Unspecified 10/01/2014, 10/02/2017   Td 04/05/1999   Tdap 04/06/2009   Zoster Recombinat (Shingrix) 09/29/2018, 01/19/2019   Zoster, Live 12/02/2008, 09/29/2018, 01/19/2019    Family History  Problem Relation Age of Onset   Heart attack Mother    Sudden death Mother    Stroke Father    Heart failure Father    Alcoholism Brother    Healthy Sister    Stroke Brother    Alpha-1 antitrypsin deficiency Daughter    Breast cancer Daughter    Prostate cancer Brother    Stroke Brother    Prostate cancer Brother    Healthy Brother    Prostate cancer Brother    Healthy Daughter    Diabetes Son    Arthritis Son       Current Outpatient Medications:    Acetaminophen (TYLENOL ARTHRITIS PAIN PO), Take 2 tablets by mouth in the morning and at bedtime., Disp: , Rfl:    apixaban (ELIQUIS) 5 MG TABS tablet, Take 1 tablet (5 mg total) by mouth 2 (two) times daily., Disp: 180 tablet, Rfl: 3   B Complex-C (SUPER B COMPLEX PO), Take 1 tablet by mouth daily., Disp: , Rfl:    Biotin 5000 MCG CAPS, Take 5,000 mcg by mouth daily. , Disp: , Rfl:    Calcium Carbonate-Vit D-Min (CALCIUM 1200 PO), Take 1,200 mg by mouth daily. , Disp: , Rfl:    cholecalciferol (VITAMIN D) 1000 UNITS tablet, Take 6,000 Units by mouth daily. , Disp: , Rfl:    Co-Enzyme Q10 100 MG CAPS, Take 100 mg by mouth daily. , Disp: , Rfl:    diazepam (VALIUM) 5 MG tablet, TAKE 1 TABLET EACH DAY. (Patient taking differently: Take 5 mg by mouth daily as needed for anxiety. TAKE 1 TABLET EACH DAY.), Disp: 30 tablet, Rfl: 0   fluticasone (FLONASE) 50 MCG/ACT nasal spray, Place 2 sprays into both nostrils as needed for allergies or rhinitis., Disp: , Rfl:    folic acid (FOLVITE) 315 MCG tablet, Take 400 mcg by mouth daily., Disp: , Rfl:    glucosamine-chondroitin 500-400 MG tablet, Take 1 tablet by mouth 2 (two) times daily. , Disp: , Rfl:    loratadine (CLARITIN) 10 MG tablet, Take 10 mg by mouth daily., Disp: , Rfl:    MAGNESIUM CITRATE PO, Take 400 mg by mouth daily. 1 tab daily, Disp: , Rfl:    Multiple Vitamins-Minerals (ZINC PO), , Disp: , Rfl:    nitroGLYCERIN (NITROSTAT) 0.4 MG SL tablet, Place 1 tablet (0.4 mg total) under the tongue every 5 (five) minutes as needed for chest pain., Disp: 25 tablet, Rfl: 1   omeprazole (PRILOSEC) 40 MG capsule, Take 1 capsule by mouth daily., Disp: , Rfl:    Resveratrol 250 MG CAPS, Take 250 mg by mouth daily., Disp: , Rfl:    sertraline (ZOLOFT) 100 MG tablet, TAKE 1 TABLET EACH DAY., Disp: 90 tablet, Rfl: 0   TURMERIC PO, Take 1,000 mg by mouth daily., Disp: , Rfl:    Nintedanib (OFEV) 100 MG CAPS, Take 1  capsule (100 mg total) by mouth 2 (two) times daily. (Patient not taking: Reported on 09/26/2021), Disp: 180 capsule, Rfl: 1      Objective:   Vitals:   09/26/21 1601  BP: 128/72  Pulse: 71  Temp: 98 F (36.7 C)  TempSrc: Oral  SpO2: 98%  Weight: 276 lb (125.2 kg)  Height: _0  (1.702 m)  Estimated body mass index is 43.23 kg/m as calculated from the following:   Height as of this encounter: _0  (1.702 m).   Weight as of this encounter: 276 lb (125.2 kg).  _1 @  Filed Weights   09/26/21 1601  Weight: 276 lb (125.2 kg)     Physical Exam General: No distress. Morbidly obese Neuro: Alert and Oriented x 3. GCS 15. Speech normal Psych: Pleasant Resp:  Barrel Chest - no.  Wheeze - no, Crackles - no, No overt respiratory distress CVS: Normal heart sounds. Murmurs - no Ext: Stigmata of Connective Tissue Disease - no HEENT: Normal upper airway. PEERL +. No post nasal drip        Assessment:       ICD-10-CM   1. IPF (idiopathic pulmonary fibrosis) ([place])  J84.112     2. Medication monitoring encounter  Z51.81     3. Rectal bleeding  K62.5          Plan:     Patient Instructions  IPF (idiopathic pulmonary fibrosis) ([facility]) Medication monitoring encounter  - several mild side effects with ofev but also dealing with progressive disease  Plan  - shared decision making  - rechallenge with ofev again  - start 154m once daily for 1 week and then go to 1083mtwice daily  - any side effects call usKoreatraight away - if this does not work, then can look at clinical trials - take consent form for 095 [address] study  Rectal bleeding  - ? Due to ofev and eliquis together - ? Any other GI issue  Plan  - visit your GI doc  Followup  2 weeks or sooner if needed - can be phoe or video visit   ( Level 05 visit: Estb 40-54 min    in  visit type: on-site physical face to visit  in total care time and counseling or/and coordination of care by this  undersigned MD - Dr MuBrand MalesThis includes one or more of the following on this same day 09/26/2021: pre-charting, chart review, note writing, documentation discussion of test results, diagnostic or treatment recommendations, prognosis, risks and benefits of management options, instructions, education, compliance or risk-factor reduction. It excludes time spent by the [place] office staff in the care of the patient. Actual time 50 min)   SIGNATURE    Dr. MuBrand MalesM.D., F.C.C.P,  Pulmonary and Critical Care Medicine Staff Physician, [place] - Interstitial Lung Disease  Program  Pulmonary Fi[place]at [id number]: [phone] no answer or between  15:00h - 7:00h: call [phone] Telephone: [phone]  11:52 PM 09/26/2021

## 2021-09-26 NOTE — Patient Instructions (Signed)
Spirometry/DLCO performed today.

## 2021-10-03 LAB — PULMONARY FUNCTION TEST
DL/VA % pred: 77 %
DL/VA: 3.11 ml/min/mmHg/L
DLCO cor % pred: 45 %
DLCO cor: 9.48 ml/min/mmHg
DLCO unc % pred: 47 %
DLCO unc: 9.76 ml/min/mmHg
FEF 25-75 Pre: 3.02 L/sec
FEF2575-%Pred-Pre: 190 %
FEV1-%Pred-Pre: 92 %
FEV1-Pre: 2.06 L
FEV1FVC-%Pred-Pre: 117 %
FEV6-%Pred-Pre: 83 %
FEV6-Pre: 2.37 L
FEV6FVC-%Pred-Pre: 104 %
FVC-%Pred-Pre: 79 %
FVC-Pre: 2.38 L
Pre FEV1/FVC ratio: 87 %
Pre FEV6/FVC Ratio: 99 %

## 2021-10-09 DIAGNOSIS — K219 Gastro-esophageal reflux disease without esophagitis: Secondary | ICD-10-CM | POA: Diagnosis not present

## 2021-10-09 DIAGNOSIS — I10 Essential (primary) hypertension: Secondary | ICD-10-CM | POA: Diagnosis not present

## 2021-10-09 DIAGNOSIS — G459 Transient cerebral ischemic attack, unspecified: Secondary | ICD-10-CM | POA: Diagnosis not present

## 2021-10-09 DIAGNOSIS — I480 Paroxysmal atrial fibrillation: Secondary | ICD-10-CM | POA: Diagnosis not present

## 2021-10-09 DIAGNOSIS — F331 Major depressive disorder, recurrent, moderate: Secondary | ICD-10-CM | POA: Diagnosis not present

## 2021-10-09 DIAGNOSIS — M1712 Unilateral primary osteoarthritis, left knee: Secondary | ICD-10-CM | POA: Diagnosis not present

## 2021-10-10 ENCOUNTER — Other Ambulatory Visit: Payer: Self-pay

## 2021-10-10 ENCOUNTER — Telehealth: Payer: Self-pay | Admitting: Internal Medicine

## 2021-10-10 ENCOUNTER — Ambulatory Visit (INDEPENDENT_AMBULATORY_CARE_PROVIDER_SITE_OTHER): Payer: Medicare Other | Admitting: Internal Medicine

## 2021-10-10 ENCOUNTER — Encounter: Payer: Self-pay | Admitting: Internal Medicine

## 2021-10-10 ENCOUNTER — Ambulatory Visit (INDEPENDENT_AMBULATORY_CARE_PROVIDER_SITE_OTHER): Payer: Medicare Other

## 2021-10-10 DIAGNOSIS — J84112 Idiopathic pulmonary fibrosis: Secondary | ICD-10-CM | POA: Diagnosis not present

## 2021-10-10 DIAGNOSIS — I495 Sick sinus syndrome: Secondary | ICD-10-CM | POA: Diagnosis not present

## 2021-10-10 DIAGNOSIS — Z5181 Encounter for therapeutic drug level monitoring: Secondary | ICD-10-CM

## 2021-10-10 NOTE — Progress Notes (Signed)
_0  ID: [name] [name], female    DOB: 09/11/1941, 80 y.o.   MRN: [id number]  Chief Complaint  Patient presents with   Follow-up    Pt. Says she's had some bleeding.     Referring provider: [name], MD  HPI: 80 year old female former smoker quit in 1971.  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 Patient of Dr. Chase Caller, last seen by pulmonary NP on 04/10/21.   Previous [facility] pulmonary encounter: 04/10/2021 follow-up; Bronchitis, Pulmonary fibrosis Patient returns for a follow-up visit, patient was seen last week for cough, congestion, shortness of breath and chills.  COVID-19 testing was negative.  Patient had been fully vaccinated for COVID-19.  She had no increased oxygen demands.  Patient was given doxycycline for 7 days and prednisone 20 mg for 5 days. Patient complains ongoing cough and congestion , mucus remains green on/off. No fever.  Eating well , no n/v.d. No hemoptysis , orthopnea or edema.  Called in over the weekend and was called in steroid burst. Currently on Prednisone 49m daily .  Chest xray showed chronic changes with no acute process.  Lives at [facility], drives.   04/17/2021 Patient presents today follow-up with PFTs. She did not have PFTs done today because she is still not feeling well. She completed course of doxycycline and has two days left of prednisone. Reports "cold symptoms" described as sinus drainage and cough. Sputum has not real purulence. Energy level is low. States that she feels her head is not right. She is using Flonase ans Claritin. She is short of breath. She completed doxycycline twice with no improvement. She is taking mucinex twice a day. She has not used albuterol rescue inhaler. Her ex husband died two weeks ago, she found out two days ago. Denies depressed feelings.    05/15/2021 -  Dr. RChase CallerType of  visit: Telephone/Video [name]89y.o. - changed face to face visit to telephone visit due to > 57F heat. Wantd to know PFT resuts June 2022 copared to sept 2021. FVC down 1.6%, DLCO down 2.1% (compared to y  2years ago FVC declined 7.7%). On Esbeit since Dec 2021/Jan 2022.  Says Dr C sarted on her ditliazem 04/18/21 and after that esbriet became more intolerable. She had constipationa and retain fluids.  So, she stopped all her medications except  eliquis and anti depressant and anti histamine. This means she stopped esbriet approx a week ago. Says that for several days still not feel better though better today. She is unsure if today is a good day v bad day. But overall feels A Fib is acting up. She wants to restarrtrt esbriet after good w wash out.   She is now using o2 at night but feels is not helping energy levels at day time. I Advised she could return it and then she said is actually helping her and does not want to return it.    08/14/21- Dr. RChase CallerChief Complaint  Patient presents with   Follow-up    Pt states she has been doing okay since last visit. Still becomes SOB with exertion.     [name]89y.o. -returns for follow-up.  Since her last visit because she was having calls with side effects from pirfenidone.  She stopped it.  She says after that she started feeling better.  Despite this she is reporting progressive  decline in dyspnea.  Dyspnea score is listed below.  She uses oxygen with exertion as needed.  I at night she uses CPAP without oxygen.  She is frustrated with pirfenidone.  She asked about the alternative nintedanib.  She recollects that there constraints with it in the setting of heart disease and anticoagulation.  Explained that one of the study showed 0.5% increase incidence and MI with nintedanib.  But not the other studies.  Explained that surgical mechanism of anticoagulation but clinically patient have tolerated.  Explained about GI side effects  particularly diarrhea.  She is nervous and hesitant but finally we took a shared decision making that she would start it at a lower dose given the fact it can be beneficial in reducing risk of progression.  We also discussed various aspects of clinical trials documented below.  She is interested in this as long as it is a medication with a much better side effect profile.  But at this point in time we took a shared decision making to go with standard of care therapy first.  She will have the flu shot today.  She is very nervous about getting mRNA COVID booster because of the side effect profile.  She had significant side effects apparently.  She also had omicron COVID in January 2022 and she is wondering about natural immunity.  However she is aware is COVID everywhere currently.  We took a shared decision making to check IgG antibody levels and adjust the risk threshold accordingly.  09/18/2021- Interim hx  Patient presents today for acute visit for medication reaction to OFEV. Patient called our office yesterday with reports of rectal bleeding and abdominal pain since starting 09/17/21. She is newly on OFEV since 10/13 and is also on Eliquis. Our office advised her to stop OFEV. Rectal bleeding has since resolved, she still has some slight lower abdomen discomfort described as "bloating".  Breathing wise she is stable. She uses POC 2L with exertion as needed.  She is on oxygen at night with her CPAP. Denies f/c/s, diarrhea, nausea or vomitting.     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  Small pulmonary nodules measuring 5 mm or less stable dating back to 2018 consistent with a benign etiology.  OV 09/26/2021  Subjective:  [name], female , DOB: 1941/08/08 , age 14 y.o. , MRN: [id number] , ADDRESS: [address] [place] [address] PCP [name], MD Patient Care Team: [name], MD as  PCP - General (Family Medicine) Sanda Klein, MD as PCP - Cardiology (Cardiology) Maisie Fus, MD as Consulting Physician (Obstetrics and Gynecology) Richmond Campbell, MD as Consulting Physician (Gastroenterology) Croitoru, Dani Gobble, MD as Consulting Physician (Cardiology) Kathie Rhodes, MD (Inactive) as Consulting Physician (Urology) Danella Sensing, MD as Consulting Physician (Dermatology) Brand Males, MD as Consulting Physician (Pulmonary Disease) Bo Merino, MD as Consulting Physician (Rheumatology) Barbaraann Cao, OD as Referring Physician (Optometry) Berenice Primas, MD as Referring Physician (Orthopedic Surgery)  This Provider for this visit: Treatment Team:  Attending Provider: Brand Males, MD    09/26/2021 -   Chief Complaint  Patient presents with   Follow-up    PFT performed today.  Pt states she has been doing okay since last visit.   Follow-up IPF   - sept 09, 2021 update - IPF dx givien: age greater than 67, only trace positive ANAdescription of small hiatal hernia on CT scan, the  type of pattern on the CT scan called probable UIP, autoimmune antibodies negative last year    - last CT July 2021   -last PFT sept 201  - portable o2 since sept 2021  -esbriet start end sept 2021 -> stopped 11/02/20 due to fatigue -> rstar 11/09/2020 -. Max dose 2 pilll tid since Dec 02, 2020 -> intermittent start/stop summer 2022 -> stopped aug 2022  - Overnight pulse oximetry done on 03/12/2021 shows time spent less than equal to 88% is 40.3 minutes.  Average pulse was 60/min.  Time spent in bradycardia 72.5%. Start 2 L nasal cannula at night  Multiple Lung Nodules 47m- stable 202/12/18-> July 2021 Alpha-1 MZ but does not have emphysema on CT 202-12-21  [Daughter died from alpha-1 CC] Obesity  Diastolic dysfunction Atrial fibrillation on Eliquis Obstructive sleep apnea on CPAP without oxygen COVID-19 incidental 12/28/2020 - likely Omicron4  HPI BPAISLIE TESSLER89y.o.  -presents ith daughter ? SJeralyn Ruthsmeeting first time. Doing stable from symptom stand point resp wise but PFT down significantly. Took ofev for 5 days and there are aspectso of taking ofev (1026mbid - the lower dose) tht she liked. She felt her urinary stream had better flow (not a known side effect). She had lower appetite (known Se with ofev) but she liked the curb on appetie it due to her obesity. The 5d into she had had mild discomfort in suprapubic area (known SE for abd discmofort) and had 1 drop of painless rectal bleed. So stopped ofev (also on eliquisl bleeding theoertical risk with ofev). After stopping ofev, urinary flow is back to baseline(does not like it), and no mpre rectal bleeding . She never had rectal bleeding before and afer. She again stated and made clear the so called bleeding was "1 drop of blood ont he floor". Associted with ofev intake on background of eliquis and associted with suprapubic discomfor. No dysuria. Dr MaBrien Matess her GI. Last colonoscopy was when she ws 89nd was normal apparently  Discussed  - seeing a GI doc for rectal bleed - she is agreeable   - discussed rechallenge with ofev v joining clinical trial phase 3( to be volunteer + exploit therapeutic advantage with 50% chance of placebo) Discussed other asoects of trials. Explained the 0902/12/1995ephrysu study with IV MAB against CTGF Pamrevulumab v placeo.  See below. She and dauhger want her to do clinical trial but are more enthused about the idea of a ofev rechallenge first. We discussed that rechallenge liketly to cause side effects but anecdotally seems some patients who do not have issues. She is not interested in esbriet rechallenge   1. Scientific Purpose  Clinical research is designed to produce generalizable knowledge and to answer questions about the safety and efficacy of intervention(s) under study in order to determine whether or not they may be useful for the care of future patients.  2. Study  Procedures  Participation in a trial may involve procedures or tests, in addition to the intervention(s) under study, that are intended only or primarily to generate scientific knowledge and that are otherwise not necessary for patient care.   3. Uncertainty  For intervention(s) under study in clinical research, there often is less knowledge and more uncertainty about the risks and benefits to a population of trial participants than there is when a doctor offers a patient standard interventions.   4. Adherence to Protocol  Administration of the intervention(s) under study is typically based on a  strict protocol with defined dose, scheduling, and use or avoidance of concurrent medications, compared to administration of standard interventions.  5. Clinician as Investigator  Clinicians who are in health care settings provide treatment; in a clinical trial setting, they are also investigating safety and efficacy of an intervention. In otherwise your doctor or nurse practitioner can be wearing 2 hats - one as care giver another as Company secretary  6. Patient as Visual merchandiser Subject  Patients participating in research trials are research subjects or volunteers. In other words participating in research is 100% voluntary and at one's own free weill. The decision to participate or not participate will NOT affect patient care and the doctor-patient relationship in any way  7. Financial Conflict of Interest Disclosure  One or more of the investigators of  the research trial might have an investment interest in [employer] the clinical trials practice through which the study is being conducted. Study sponsor pays the practice for the conduct of the study.          OV 10/10/2021  Subjective:  [name], female , DOB: 1941/02/11 , age 40 y.o. , MRN: [id number] , ADDRESS: [place] [address] PCP [name], MD Patient Care  Team: [name], MD as PCP - General (Family Medicine) Croitoru, Dani Gobble, MD as PCP - Cardiology (Cardiology) Maisie Fus, MD as Consulting Physician (Obstetrics and Gynecology) Richmond Campbell, MD as Consulting Physician (Gastroenterology) Croitoru, Dani Gobble, MD as Consulting Physician (Cardiology) Kathie Rhodes, MD (Inactive) as Consulting Physician (Urology) Danella Sensing, MD as Consulting Physician (Dermatology) Brand Males, MD as Consulting Physician (Pulmonary Disease) Bo Merino, MD as Consulting Physician (Rheumatology) Barbaraann Cao, OD as Referring Physician (Optometry) Berenice Primas, MD as Referring Physician (Orthopedic Surgery)  This Provider for this visit: Treatment Team:  Attending Provider: Brand Males, MD    10/10/2021 -   Chief Complaint  Patient presents with   Follow-up    Patient states that  she is doing good with the Ofev and is currently not having any side effects. States that she has been coughing a lot lately. Wants to know if she is due for pneumonia vaccine.   Type of visit: Telephone/Video Circumstance: COVID-19 national emergency Identification of patient TANZIE ROTHSCHILD with 1940/12/20 and MRN [id number] - 2 person identifier Risks: Risks, benefits, limitations of telephone visit explained. Patient understood and verbalized agreement to proceed Anyone else on call: just patient Patient location: her home This provider location: [address] [place], [place] [address]   HPI CIGI BEGA 80 y.o. -at this point telephone visit is to see how rechallenge with nintedanib is ongoing.  Since 10/06/2021 Saturday she is on 100 mg twice daily.  She says she has not had any recurrence of bleeding.  No GI symptoms.  Tolerating it well.  Dyspnea is not any worse.  No new issues.  Noticed mild reduction in GFR in October 2022.  Liver function test and hemoglobin are fine.         SYMPTOM SCALE - ILD 07/21/2019   08/10/2020 262#  09/26/2020 Last Weight  Most recent update: 09/26/2020  1:42 PM      Weight  120.9 kg (266 lb 9.6 oz)                     11/09/2020 274# 01/23/2021 279# 08/14/2021 275# 09/18/2021 Started OFEV 10/13 but stoped 10/17 d/t GIB  09/26/2021 Off ofev. No more "rectal  bleed"  O2 use ra ra ra o2 with ex 02 with ex   O2 with exertoin   Shortness of Breath 0 -> 5 scale with 5 being worst (score 6 If unable to do)              At rest 0 0   5 0 4 0 0  Simple tasks - showers, clothes change, eating, shaving _0 3.5  Household (dishes, doing bed, laundry) _1 Shopping _2 4.5 4  Walking level at own pace _3 Walking up Stairs _4 Total (40 - 48) Dyspnea Score _5 23.5 22.5  How bad is your cough? 1 1-_6 How bad is your fatigue _7 3.5  nausea   0 0 0 0 0 0 0  vomit   0 0 0 0 0 0 0  diarrhea   0 0 0 1 0 0.5 0  axiety   2 1 0 0 0.5 0.5 0  depression   2 1._8 0.5-1        Simple office walk 185 feet x  3 laps goal with forehead probe 07/21/2019   08/10/2020   01/23/2021   09/18/2021   O2 used ra ra ra RA  Number laps completed 3 laps _9 Comments about pace modertae with walker Slow pace Sow pace Slow pace  Resting Pulse Ox/HR 97% and 78/min 100% and 74/min 94%and HR 92 96% RA and HR 80  Final Pulse Ox/HR 93% and 109/min 77% and 85/min 90 % and HR 100 90% and HR 125  Desaturated </= 88% no no no No  Desaturated <= 3% points Yes, 4 Yes, 13 points Yes, 4 piints Yes, 6 points  Got Tachycardic >/= 90/min you have pulmonary fibrosis otherwise call interstitial lung disease yes no yes Yes  Symptoms at end of test dyspnea Mild dyspnea Dyspnea throughtou Dyspnea   Miscellaneous comments x Corrected with 2LNC and got    X        PFTResults for JAYANI, ROZMAN (MRN [id number]) as of 09/26/2021 23:35  Ref. Range 02/24/2017 11:58 07/19/2019 15:20 08/10/2020 10:03 05/07/2021 15:12 09/26/2021   FVC-Pre  Latest Units: L 2.58 2.74 2.57 2.53 2.38  FVC-%Pred-Pre Latest Units: % 85 93 84 84 79  Results for NADIYAH, ZEIS (MRN [id number]) as of 09/26/2021 23:35  Ref. Range 02/24/2017 11:58 07/19/2019 15:20 08/10/2020 10:03 05/07/2021 15:12 09/26/2021   DLCO unc Latest Units: ml/min/mmHg 16.15 16.52 15.83 15.50 9.76  DLCO unc % pred Latest Units: % 59 81 76 74 47    CT Chest data  No results found.    PFT  PFT Results Latest Ref Rng & Units 09/26/2021 05/07/2021 08/10/2020 07/19/2019 02/24/2017  FVC-Pre L 2.38 2.53 2.57 2.74 2.58  FVC-Predicted Pre % 79 84 84 93 85  FVC-Post L - - - 2.63 2.45  FVC-Predicted Post % - - - 89 81  Pre FEV1/FVC % % 87 86 86 86 82  Post FEV1/FCV % % - - - 90 87  FEV1-Pre L 2.06 2.17 2.22 2.36 2.12  FEV1-Predicted Pre % 92 97 97 107 93  FEV1-Post L - - -  2.35 2.14  DLCO uncorrected ml/min/mmHg 9.76 15.50 15.83 16.52 16.15  DLCO UNC% % 47 74 76 81 59  DLCO corrected ml/min/mmHg 9.48 15.50 15.34 - 14.93  DLCO COR %Predicted % 45 74 73 - 55  DLVA Predicted % 77 105 97 104 79  TLC L - - 4.50 3.97 -  TLC % Predicted % - - 81 74 -  RV % Predicted % - - 71 52 -       has a past medical history of Alpha-1-antitrypsin deficiency carrier, Arthritis, Atherosclerosis of aorta (HCC), Atrial fibrillation ([place]), Back pain, Complication of anesthesia, Diastolic dysfunction, Frequent UTI, GERD (gastroesophageal reflux disease), Hypertension, Hypertriglyceridemia, Insulin resistance, Long term current use of anticoagulant, Multiple lung nodules, Muscular deconditioning, MVP (mitral valve prolapse), Obesity, OSA on CPAP, Osteoarthritis, Osteopenia, Pacemaker, PAF (paroxysmal atrial fibrillation) (HCC), Positive ANA (antinuclear antibody), Presbyesophagus, Presence of permanent cardiac pacemaker, Primary osteoarthritis of both hands, Pulmonary fibrosis ([place]), PVC's (premature ventricular contractions), Shortness of breath, Sinus arrest (10/2014), Sleep apnea, Stroke ([place]) (01/2014),  Tachy-brady syndrome ([place]) (10/2014), TIA (transient ischemic attack), and Vitamin D deficiency.   reports that she quit smoking about 51 years ago. Her smoking use included cigarettes. She has a 36.00 pack-year smoking history. She has never used smokeless tobacco.  Past Surgical History:  Procedure Laterality Date   CARDIOVERSION N/A 09/02/2017   Procedure: CARDIOVERSION;  Surgeon: Sanda Klein, MD;  Location: [facility];  Service: Cardiovascular;  Laterality: N/A;   COLONOSCOPY  2019   ESOPHAGEAL DILATION     EXCISION VAGINAL CYST     benign nodules   INSERT / REPLACE / REMOVE PACEMAKER  10/04/2014   Medtronic Advisa model A2DR01 serial number [id number]   LOOP RECORDER EXPLANT N/A 10/04/2014   Procedure: LOOP RECORDER EXPLANT;  Surgeon: Sanda Klein, MD;  Location: [facility];  Service: Cardiovascular;  Laterality: N/A;   LOOP RECORDER IMPLANT N/A 04/19/2014   Procedure: LOOP RECORDER IMPLANT;  Surgeon: Sanda Klein, MD;  Location: [facility];  Service: Cardiovascular;  Laterality: N/A;   NM MYOCAR PERF WALL MOTION  12/18/2007   normal   PERMANENT PACEMAKER INSERTION N/A 10/04/2014   Procedure: PERMANENT PACEMAKER INSERTION;  Surgeon: Sanda Klein, MD;  Location: [facility];  Service: Cardiovascular;  Laterality: N/A;   TUBAL LIGATION     US ECHOCARDIOGRAPHY  05/15/2010   LA mildly dilated,mild mitral annular ca+, AOV mildly sclerotic, mild asymmetric LVH    Allergies  Allergen Reactions   Ancef [Cefazolin] Hives and Itching   Pseudoephedrine Hcl     Other reaction(s): palpitations (moderate)   Ergotamine    Other Other (See Comments)   Pentobarbital    Pirfenidone    Caffeine Palpitations    Immunization History  Administered Date(s) Administered   Fluad Quad(high Dose 65+) 08/14/2021   Influenza Split 10/09/2009, 09/19/2010, 09/13/2020   Influenza Whole 09/23/2008   Influenza, High Dose Seasonal PF 09/13/2018, 09/27/2019, 09/26/2020   Influenza,inj,quad, With  Preservative 09/04/2017   Influenza-Unspecified 09/17/2014, 09/09/2016, 09/04/2017   Moderna Sars-Covid-2 Vaccination 12/06/2019, 01/03/2020   Pneumococcal Conjugate-13 09/29/2014, 10/16/2015   Pneumococcal Polysaccharide-23 04/06/2009, 10/02/2017   Pneumococcal-Unspecified 10/01/2014, 10/02/2017   Td 04/05/1999   Tdap 04/06/2009   Zoster Recombinat (Shingrix) 09/29/2018, 01/19/2019   Zoster, Live 12/02/2008, 09/29/2018, 01/19/2019    Family History  Problem Relation Age of Onset   Heart attack Mother    Sudden death Mother    Stroke Father    Heart failure Father    Alcoholism Brother  Healthy Sister    Stroke Brother    Alpha-1 antitrypsin deficiency Daughter    Breast cancer Daughter    Prostate cancer Brother    Stroke Brother    Prostate cancer Brother    Healthy Brother    Prostate cancer Brother    Healthy Daughter    Diabetes Son    Arthritis Son      Current Outpatient Medications:    Acetaminophen (TYLENOL ARTHRITIS PAIN PO), Take 2 tablets by mouth in the morning and at bedtime., Disp: , Rfl:    apixaban (ELIQUIS) 5 MG TABS tablet, Take 1 tablet (5 mg total) by mouth 2 (two) times daily., Disp: 180 tablet, Rfl: 3   B Complex-C (SUPER B COMPLEX PO), Take 1 tablet by mouth daily., Disp: , Rfl:    Biotin 5000 MCG CAPS, Take 5,000 mcg by mouth daily. , Disp: , Rfl:    Calcium Carbonate-Vit D-Min (CALCIUM 1200 PO), Take 1,200 mg by mouth daily. , Disp: , Rfl:    cholecalciferol (VITAMIN D) 1000 UNITS tablet, Take 6,000 Units by mouth daily. , Disp: , Rfl:    Co-Enzyme Q10 100 MG CAPS, Take 100 mg by mouth daily. , Disp: , Rfl:    diazepam (VALIUM) 5 MG tablet, TAKE 1 TABLET EACH DAY. (Patient taking differently: Take 5 mg by mouth daily as needed for anxiety. TAKE 1 TABLET EACH DAY.), Disp: 30 tablet, Rfl: 0   fluticasone (FLONASE) 50 MCG/ACT nasal spray, Place 2 sprays into both nostrils as needed for allergies or rhinitis., Disp: , Rfl:    folic acid (FOLVITE)  440 MCG tablet, Take 400 mcg by mouth daily., Disp: , Rfl:    glucosamine-chondroitin 500-400 MG tablet, Take 1 tablet by mouth 2 (two) times daily. , Disp: , Rfl:    loratadine (CLARITIN) 10 MG tablet, Take 10 mg by mouth daily., Disp: , Rfl:    MAGNESIUM CITRATE PO, Take 400 mg by mouth daily. 1 tab daily, Disp: , Rfl:    Multiple Vitamins-Minerals (ZINC PO), , Disp: , Rfl:    Nintedanib (OFEV) 100 MG CAPS, Take 1 capsule (100 mg total) by mouth 2 (two) times daily., Disp: 180 capsule, Rfl: 1   nitroGLYCERIN (NITROSTAT) 0.4 MG SL tablet, Place 1 tablet (0.4 mg total) under the tongue every 5 (five) minutes as needed for chest pain., Disp: 25 tablet, Rfl: 1   omeprazole (PRILOSEC) 40 MG capsule, Take 1 capsule by mouth daily., Disp: , Rfl:    Resveratrol 250 MG CAPS, Take 250 mg by mouth daily., Disp: , Rfl:    sertraline (ZOLOFT) 100 MG tablet, TAKE 1 TABLET EACH DAY., Disp: 90 tablet, Rfl: 0      Objective:   There were no vitals filed for this visit.  Estimated body mass index is 43.23 kg/m as calculated from the following:   Height as of 09/26/21: _0  (1.702 m).   Weight as of 09/26/21: 276 lb (125.2 kg).  _1 @  There were no vitals filed for this visit.   Physical Exam Sounded normal on this telephone visit     Assessment:       ICD-10-CM   1. IPF (idiopathic pulmonary fibrosis) ([place])  J84.112     2. Medication monitoring encounter  Z51.81          Plan:     Patient Instructions  IPF (idiopathic pulmonary fibrosis) ([place]) Medication monitoring encounter  - glad  you are toleratnig rechallenge of ofev 128m twice daily (lower  dose regimen only for you) since 10/06/21   Plan  -  - continue ofev 141m twice daily  - any side effects call uKoreastraight away - if this does not work, then can look at clinical trials - take consent form for 095 [address] study  Rectal bleeding  - glad no recurrence since ofev rechallened  Plan  - visit your GI doc  because this happened once - so is important you get it addressed  Followup   3-4 weeks do cbc, bmet and lft 3-4 weeks do video or face to face visit - 15 min to discuss ofev update annd toleracnce    (Telephone visit - Level 01 visit: Estb 05-10 for this visit type which was visit type: telephone visit in total care time and counseling or/and coordination of care by this undersigned MD - Dr MBrand Males This includes one or more of the following for care delivered on 10/10/2021 same day: pre-charting, chart review, note writing, documentation discussion of test results, diagnostic or treatment recommendations, prognosis, risks and benefits of management options, instructions, education, compliance or risk-factor reduction. It excludes time spent by the CIolaor office staff in the care of the patient. Actual time was 10 . E&M code is [id number]    Dr. MBrand Males M.D., F.C.C.P,  Pulmonary and Critical Care Medicine Staff Physician, CZoarDirector - Interstitial Lung Disease  Program  Pulmonary [place] [place] [id number] Pager: [phone] If no answer or between  15:00h - 7:00h: call [phone] Telephone: [phone]  9:57 AM 10/10/2021

## 2021-10-10 NOTE — Addendum Note (Signed)
Addended by: Lorretta Harp on: 10/10/2021 10:05 AM   Modules accepted: Orders

## 2021-10-10 NOTE — Telephone Encounter (Signed)
Pt called back and has been scheduled for a follow up with MR.

## 2021-10-10 NOTE — Telephone Encounter (Signed)
Pt had a virtual visit today 11/9 with MR and per MR, pt to follow up in 3-4 weeks. This visit needs to be in-office as pt needs to have labwork done the same day of the visit. Pt will need 78mn ILD visit.  Attempted to call pt to get her scheduled for a f/u with MR in 3-4 weeks or next avail soonest after that but unable to reach. Left message for her to return call.

## 2021-10-10 NOTE — Patient Instructions (Addendum)
IPF (idiopathic pulmonary fibrosis) ([place]) Medication monitoring encounter  - glad  you are toleratnig rechallenge of ofev 134m twice daily (lower dose regimen only for you) since 10/06/21   Plan  -  - continue ofev 1081mtwice daily  - any side effects call usKoreatraight away - if this does not work, then can look at clinical trials - take consent form for 095 [address] study  Rectal bleeding  - glad no recurrence since ofev rechallened  Plan  - visit your GI doc because this happened once - so is important you get it addressed  Followup   3-4 weeks do cbc, bmet and lft 3-4 weeks do video or face to face visit - 15 min to discuss ofev update annd toleracnce

## 2021-10-11 LAB — CUP PACEART REMOTE DEVICE CHECK
Battery Remaining Longevity: 44 mo
Battery Voltage: 2.97 V
Brady Statistic AP VP Percent: 0.04 %
Brady Statistic AP VS Percent: 72.98 %
Brady Statistic AS VP Percent: 0.01 %
Brady Statistic AS VS Percent: 26.96 %
Brady Statistic RA Percent Paced: 72.24 %
Brady Statistic RV Percent Paced: 0.06 %
Date Time Interrogation Session: 20221110105345
Implantable Lead Implant Date: 20151103
Implantable Lead Implant Date: 20151103
Implantable Lead Location: 753859
Implantable Lead Location: 753860
Implantable Lead Model: 5076
Implantable Lead Model: 5076
Implantable Pulse Generator Implant Date: 20151103
Lead Channel Impedance Value: 418 Ohm
Lead Channel Impedance Value: 456 Ohm
Lead Channel Impedance Value: 475 Ohm
Lead Channel Impedance Value: 513 Ohm
Lead Channel Pacing Threshold Amplitude: 0.5 V
Lead Channel Pacing Threshold Amplitude: 0.5 V
Lead Channel Pacing Threshold Pulse Width: 0.4 ms
Lead Channel Pacing Threshold Pulse Width: 0.4 ms
Lead Channel Sensing Intrinsic Amplitude: 5 mV
Lead Channel Sensing Intrinsic Amplitude: 5 mV
Lead Channel Sensing Intrinsic Amplitude: 6.5 mV
Lead Channel Sensing Intrinsic Amplitude: 6.5 mV
Lead Channel Setting Pacing Amplitude: 1.5 V
Lead Channel Setting Pacing Amplitude: 2 V
Lead Channel Setting Pacing Pulse Width: 0.4 ms
Lead Channel Setting Sensing Sensitivity: 2.8 mV

## 2021-10-16 ENCOUNTER — Telehealth: Payer: Self-pay

## 2021-10-16 NOTE — Telephone Encounter (Signed)
Transmission reviewed. Ongoing episode of AF with some RVR since 10/14/21 at 17:17. Successful telephone encounter to patient to discuss. Patient states she is aware secondary to erratic HR on pulse ox and feeling more tired than usual. Chronic shortness of breath secondary to pulmonary fibrosis. May have worsened slightly over the last couple of days. Patient is compliant with all medications including eliquis 5 mg BID. Known AF history. Burden 37.9%. ED precautions reviewed. Will route to Dr. Loletha Grayer for review and recommendations for elevated V rates. Patient is not on antiarrythmic or rate control drug however she has been taking metoprolol 75m intermittently the last 2 days which was prescribed in 2019.

## 2021-10-16 NOTE — Telephone Encounter (Signed)
The patient states she wants to know how her heart doing. She has not been feeling well. She states she do not have any energy. Her heart rate went up to 118 at some point. I asked her to send a manual transmission with her home remote monitor for the nurse can review. Transmission received. I told her the nurse will give her a call back. Her phone number is [phone].

## 2021-10-17 NOTE — Telephone Encounter (Signed)
Patient is following up. She would like to make Dr. Sallyanne Kuster aware of recent transmission and she would like to know whether or not he has any recommendations. Please advise when able.

## 2021-10-17 NOTE — Telephone Encounter (Signed)
Spoke to the patient. She stated that she has felt bad for the past few days. She has been feeling tired with no energy. She has some shortness of breath but thinks this may be her pulmonary fibrosis. She denies chest pain and has been compliant with her Eliquis.  She stated that she did restart her Ofev for her pulmonary fibrosis two weeks ago.  She is unable to check her blood pressures since moving to [facility].   Appointment made for 11/21 with Dr. Sallyanne Kuster. She has been advised to call back for worsening symptoms.

## 2021-10-18 NOTE — Progress Notes (Signed)
Remote pacemaker transmission.   

## 2021-10-22 ENCOUNTER — Encounter: Payer: Self-pay | Admitting: Cardiovascular Disease

## 2021-10-22 ENCOUNTER — Ambulatory Visit (INDEPENDENT_AMBULATORY_CARE_PROVIDER_SITE_OTHER): Payer: Medicare Other | Admitting: Cardiovascular Disease

## 2021-10-22 ENCOUNTER — Other Ambulatory Visit: Payer: Self-pay

## 2021-10-22 VITALS — BP 118/72 | HR 87 | Ht 67.0 in | Wt 280.6 lb

## 2021-10-22 DIAGNOSIS — I480 Paroxysmal atrial fibrillation: Secondary | ICD-10-CM

## 2021-10-22 DIAGNOSIS — Z7901 Long term (current) use of anticoagulants: Secondary | ICD-10-CM | POA: Diagnosis not present

## 2021-10-22 DIAGNOSIS — Z950 Presence of cardiac pacemaker: Secondary | ICD-10-CM | POA: Diagnosis not present

## 2021-10-22 DIAGNOSIS — I495 Sick sinus syndrome: Secondary | ICD-10-CM

## 2021-10-22 DIAGNOSIS — G4733 Obstructive sleep apnea (adult) (pediatric): Secondary | ICD-10-CM

## 2021-10-22 DIAGNOSIS — I5032 Chronic diastolic (congestive) heart failure: Secondary | ICD-10-CM

## 2021-10-22 DIAGNOSIS — J84112 Idiopathic pulmonary fibrosis: Secondary | ICD-10-CM | POA: Diagnosis not present

## 2021-10-22 MED ORDER — METOPROLOL TARTRATE 25 MG PO TABS: 25.0000 mg | ORAL_TABLET | Freq: Two times a day (BID) | ORAL | 3 refills | Status: DC

## 2021-10-22 NOTE — Progress Notes (Signed)
Cardiology Office Note    Date:  10/28/2021   ID:  [name] [name], DOB [birth year], MRN [id number]  PCP:  [name], MD  Cardiologist:   Sanda Klein, MD   Chief Complaint  Patient presents with   Atrial Fibrillation     History of Present Illness:  [name] [name] is a 80 y.o. 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  She had an episode of symptomatic atrial fibrillation that lasted for about 4 days November 13-17.  It made her feel weak and short of breath, even though she was well rate control (average ventricular rate 89 bpm).  She is struggling with problems with lung fibrosis and taking Ofev.  Sees Dr. Chase Caller in the pulmonary clinic, last appointment was recently on 11-05-23. Ofev was stopped due to rectal bleeding and abdominal pain, but subsequently restarted without recurrent bleeding.  She uses oxygen 2 L as needed with exertion.  She previously had marked increase in frequency and overall prevalence of atrial fibrillation during COVID-19 infection  Pacemaker function is otherwise normal.  She presents today in atrial paced, ventricular sensed rhythm with a slightly prolonged AV delay of 212 ms.  Estimated generator longevity is 3.5 years.  She has 61% atrial pacing and only 1.4% ventricular pacing.  The burden of atrial fibrillation has been 11%.  2 episodes of high ventricular rates actually represent paroxysmal atrial tachycardia with 1: 1 AV conduction.  There is no true VT seen.  Outside the episode of atrial fibrillation she feels well. The patient specifically denies any chest pain at rest exertion, dyspnea at rest or with exertion, orthopnea, paroxysmal  nocturnal dyspnea, syncope, palpitations, focal neurological deficits, intermittent claudication, lower extremity edema, unexplained weight gain, cough, hemoptysis or wheezing.  She complains about further weight gain.  She is quite sedentary.  She feels very discouraged.  She has evidence of atherosclerosis of the aorta. She does not have angina pectoris. Nuclear stress test in January 2018 was normal/low risk. Her echocardiogram in April 2015 showed evidence of diastolic dysfunction without elevated filling pressures. Similar findings on echo in September 2017 with EF 50-55% and grade 1 diastolic dysfunction without elevated filling pressures. She has not had clinically evident congestive heart failure in the past. 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 She has a long-standing history of obstructive sleep apnea but uses CPAP. 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  She has a lifelong struggle with obesity and has prediabetes.  She is enrolled in Dr. Migdalia Dk bariatric clinic  Past Medical History:  Diagnosis Date   Alpha-1-antitrypsin deficiency carrier    Arthritis    "all over"   Atherosclerosis of aorta [facility])    Atrial fibrillation ([place])    Back pain    Complication of anesthesia    "I woke up during 2 different procedures" (59/08/3569)   Diastolic dysfunction    Frequent  UTI    GERD (gastroesophageal reflux disease)    Hypertension    Hypertriglyceridemia    Insulin resistance    Long term current use of anticoagulant     Multiple lung nodules    Muscular deconditioning    MVP (mitral valve prolapse)    Obesity    OSA on CPAP    Osteoarthritis    Osteopenia    unspecified location   Pacemaker    PAF (paroxysmal atrial fibrillation) (HCC)    Positive ANA (antinuclear antibody)    Presbyesophagus    Presence of permanent cardiac pacemaker    Primary osteoarthritis of both hands    neck and spine and hips   Pulmonary fibrosis ([place])    followed by pulmonolgy   PVC's (premature ventricular contractions)    Shortness of breath    Sinus arrest 10/2014   s/p Medtronic Advisa model A2DR01 serial number [id number]   Sleep apnea    Stroke ([place]) 01/2014   denies deficits on 10/04/2014   Tachy-brady syndrome ([place]) 10/2014   TIA (transient ischemic attack)    Vitamin D deficiency     Past Surgical History:  Procedure Laterality Date   CARDIOVERSION N/A 09/02/2017   Procedure: CARDIOVERSION;  Surgeon: Sanda Klein, MD;  Location: [place];  Service: Cardiovascular;  Laterality: N/A;   COLONOSCOPY  2019   ESOPHAGEAL DILATION     EXCISION VAGINAL CYST     benign nodules   INSERT / REPLACE / REMOVE PACEMAKER  10/04/2014   Medtronic Advisa model A2DR01 serial number [id number]   LOOP RECORDER EXPLANT N/A 10/04/2014   Procedure: LOOP RECORDER EXPLANT;  Surgeon: Sanda Klein, MD;  Location: [place] CATH LAB;  Service: Cardiovascular;  Laterality: N/A;   LOOP RECORDER IMPLANT N/A 04/19/2014   Procedure: LOOP RECORDER IMPLANT;  Surgeon: Sanda Klein, MD;  Location: [place] CATH LAB;  Service: Cardiovascular;  Laterality: N/A;   NM MYOCAR PERF WALL MOTION  12/18/2007   normal   PERMANENT PACEMAKER INSERTION N/A 10/04/2014   Procedure: PERMANENT PACEMAKER INSERTION;  Surgeon: Sanda Klein, MD;  Location: [place] CATH LAB;  Service: Cardiovascular;  Laterality: N/A;   TUBAL LIGATION     US ECHOCARDIOGRAPHY  05/15/2010   LA mildly dilated,mild mitral annular ca+, AOV mildly sclerotic, mild asymmetric LVH    Current  Medications: Outpatient Medications Prior to Visit  Medication Sig Dispense Refill   apixaban (ELIQUIS) 5 MG TABS tablet Take 1 tablet (5 mg total) by mouth 2 (two) times daily. 180 tablet 3   B Complex-C (SUPER B COMPLEX PO) Take 1 tablet by mouth daily.     Calcium Carbonate-Vit D-Min (CALCIUM 1200 PO) Take 1,200 mg by mouth daily.      cholecalciferol (VITAMIN D) 1000 UNITS tablet Take 6,000 Units by mouth daily.      Co-Enzyme Q10 100 MG CAPS Take 100 mg by mouth daily.      diazepam (VALIUM) 5 MG tablet TAKE 1 TABLET EACH DAY. (Patient taking differently: Take 5 mg by mouth daily as needed for anxiety. TAKE 1 TABLET EACH DAY.) 30 tablet 0   fluticasone (FLONASE) 50 MCG/ACT nasal spray Place 2 sprays into both nostrils as needed for allergies or rhinitis.     glucosamine-chondroitin 500-400 MG tablet Take 1 tablet by mouth 2 (two) times daily.      loratadine (CLARITIN) 10 MG tablet Take 10 mg by mouth daily.     MAGNESIUM CITRATE PO Take 400 mg by mouth daily. 1 tab daily  Multiple Vitamins-Minerals (ZINC PO)      omeprazole (PRILOSEC) 40 MG capsule Take 1 capsule by mouth daily.     Resveratrol 250 MG CAPS Take 250 mg by mouth daily.     sertraline (ZOLOFT) 100 MG tablet TAKE 1 TABLET EACH DAY. 90 tablet 0   Acetaminophen (TYLENOL ARTHRITIS PAIN PO) Take 2 tablets by mouth in the morning and at bedtime. (Patient not taking: Reported on 10/22/2021)     Biotin 5000 MCG CAPS Take 5,000 mcg by mouth daily.  (Patient not taking: Reported on 41/66/0630)     folic acid (FOLVITE) 160 MCG tablet Take 400 mcg by mouth daily. (Patient not taking: Reported on 10/22/2021)     Nintedanib (OFEV) 100 MG CAPS Take 1 capsule (100 mg total) by mouth 2 (two) times daily. 180 capsule 1   nitroGLYCERIN (NITROSTAT) 0.4 MG SL tablet Place 1 tablet (0.4 mg total) under the tongue every 5 (five) minutes as needed for chest pain. (Patient not taking: Reported on 10/22/2021) 25 tablet 1   No  facility-administered medications prior to visit.     Allergies:   Ancef [cefazolin], Pseudoephedrine hcl, Ergotamine, Other, Pentobarbital, Pirfenidone, and Caffeine   Social History   Socioeconomic History   Marital status: Divorced    Spouse name: Not on file   Number of children: 3   Years of education: college   Highest education level: Not on file  Occupational History   Occupation: Retired Engineer, site  Tobacco Use   Smoking status: Former    Packs/day: 2.00    Years: 18.00    Pack years: 36.00    Types: Cigarettes    Quit date: 07/18/1970    Years since quitting: 51.3   Smokeless tobacco: Never   Tobacco comments:    "quit smoking in 1971"  Vaping Use   Vaping Use: Never used  Substance and Sexual Activity   Alcohol use: Yes    Alcohol/week: 0.0 standard drinks    Comment: 10/04/2014 "might have a drink a couple times/yr"   Drug use: No   Sexual activity: Never  Other Topics Concern   Not on file  Social History Narrative   Lives at the [facility]    Patient is right handed.   Patient has a college degree.   Patient is retired.      Social History      Diet? Awful sweets are a terrible addiction       Do you drink/eat things with caffeine? some      Marital status?          divorced                          What year were you married? 1960      Do you live in a house, apartment, assisted living, condo, trailer, etc.? [place]       Is it one or more stories? 1st level      How many persons live in your home? 1      Do you have any pets in your home? (please list) no       Highest level of education completed? College graduate      Current or past profession: realtor       Do you exercise?             Not much  Type & how often? Barely any  - bed exercise for injured knee      Advanced Directives      Do you have a living will? yes      Do you have a DNR form?                                  If not, do you  want to discuss one?yes      Do you have signed POA/HPOA for forms? yes      Functional Status      Do you have difficulty bathing or dressing yourself? No       Do you have difficulty preparing food or eating? No       Do you have difficulty managing your medications? No       Do you have difficulty managing your finances? No       Do you have difficulty affording your medications? No  Except eloquist after donut hole - so far I have been able to get samples from cardiologist      Social Determinants of Health   Financial Resource Strain: Not on file  Food Insecurity: Not on file  Transportation Needs: Not on file  Physical Activity: Not on file  Stress: Not on file  Social Connections: Not on file     Family History:  The patient's family history includes Alcoholism in her brother; Alpha-1 antitrypsin deficiency in her daughter; Arthritis in her son; Breast cancer in her daughter; Diabetes in her son; Healthy in her brother, daughter, and sister; Heart attack in her mother; Heart failure in her father; Prostate cancer in her brother, brother, and brother; Stroke in her brother, brother, and father; Sudden death in her mother.   ROS:   Please see the history of present illness.    All other systems are reviewed and are negative.   PHYSICAL EXAM:   VS:  BP 118/72 (BP Location: Left Arm, Patient Position: Sitting, Cuff Size: Large)   Pulse 87   Ht _0  (1.702 m)   Wt 280 lb 9.6 oz (127.3 kg)   LMP  (LMP Unknown)   SpO2 96%   BMI 43.95 kg/m      General: Alert, oriented x3, no distress, morbidly obese.  Healthy subclavian pacemaker site. Head: no evidence of trauma, PERRL, EOMI, no exophtalmos or lid lag, no myxedema, no xanthelasma; normal ears, nose and oropharynx Neck: normal jugular venous pulsations and no hepatojugular reflux; brisk carotid pulses without delay and no carotid bruits Chest: clear to auscultation, no signs of consolidation by percussion or palpation,  normal fremitus, symmetrical and full respiratory excursions Cardiovascular: normal position and quality of the apical impulse, regular rhythm, normal first and second heart sounds, no murmurs, rubs or gallops Abdomen: no tenderness or distention, no masses by palpation, no abnormal pulsatility or arterial bruits, normal bowel sounds, no hepatosplenomegaly Extremities: no clubbing, cyanosis or edema; 2+ radial, ulnar and brachial pulses bilaterally; 2+ right femoral, posterior tibial and dorsalis pedis pulses; 2+ left femoral, posterior tibial and dorsalis pedis pulses; no subclavian or femoral bruits Neurological: grossly nonfocal Psych: Normal mood and affect    Wt Readings from Last 3 Encounters:  10/22/21 280 lb 9.6 oz (127.3 kg)  09/26/21 276 lb (125.2 kg)  09/18/21 276 lb 12.8 oz (125.6 kg)      Studies/Labs Reviewed:   ECHO 05/18/2019   1. The left  ventricle has low normal systolic function, with an ejection fraction of 50-55%. The cavity size was normal. Left ventricular diastolic function could not be evaluated.  2. The right ventricle has normal systolic function. The cavity was normal.  3. The mitral valve is grossly normal.  4. The tricuspid valve is grossly normal.  5. The aortic valve is tricuspid. Mild thickening of the aortic valve. No stenosis of the aortic valve.  6. Normal LV systolic function; no significant valvular heart disease.  EKG:  EKG is ordered today.  Personally reviewed, shows atrial paced, ventricular sensed rhythm with AV delay 212 ms, otherwise normal tracing, QTC 440 ms Recent Labs: 01/04/2021: TSH 3.320 04/17/2021: Pro B Natriuretic peptide (BNP) 289.0 09/18/2021: ALT 15; BUN 20; Creatinine, Ser 1.08; Hemoglobin 14.4; Platelets 272.0; Potassium 4.5; Sodium 139   Lipid Panel    Component Value Date/Time   CHOL 195 07/28/2020 0913   CHOL 176 06/03/2018 1022   TRIG 133 07/28/2020 0913   HDL 55 07/28/2020 0913   HDL 53 06/03/2018 1022   CHOLHDL 3.5  07/28/2020 0913   VLDL 33.6 01/15/2017 1417   LDLCALC 115 (H) 07/28/2020 0913   LDLDIRECT 81.0 07/15/2016 1419    ASSESSMENT:    1. PAF (paroxysmal atrial fibrillation) ([place])   2. SSS (sick sinus syndrome) ([place])   3. Pacemaker   4. Chronic diastolic heart failure ([place])   5. Long term current use of anticoagulant   6. Morbid obesity ([place])   7. OSA (obstructive sleep apnea)   8. Fibrosis, idiopathic pulmonary (HCC)       PLAN:  In order of problems listed above:  AFib: Symptomatic despite good rate control.  Burden of A. fib has been increased over the last year and is now at 11%.  Not a good candidate for amiodarone due to lung fibrosis.  Not a good candidate for Multaq due to diastolic heart failure.  Consider hospitalization for dofetilide initiation (baseline QTC 440 ms) or radiofrequency ablation.  Would like to refer to EP to further discuss these 2 options.  CHADSVasc 5 (Age 21, gender, TIA).  SSS: Also has evidence of AV conduction abnormalities, with good ventricular rate control during atrial fibrillation despite no rate control medications.  She requires roughly 60% atrial pacing.  Heart rate histogram distribution is appropriate with current device settings. PPM: Normal device function.  Unlikely to benefit from atrial therapy since her atrial fibrillation appears very disorganized.  She rarely has atrial tachycardia.  Continue remote downloads every 3 months and daily office visits. Dyspnea: She has had evidence of diastolic dysfunction and an elevated BNP asked, but at this point her dyspnea does not appear to be related to heart failure.  No evidence of hypervolemia on exam, although this is challenging due to she is not receiving any diuretics at this time.  Her dyspnea has always been felt to be multifactorial, related to obesity, lung fibrosis, COPD. BNP normal in 2018, but elevated in May 2022 (proBNP 289). Echo 2017 with normal EF, mild diastolic dysfunction without  evidence of elevated filling pressures.  Echo performed in June 2020 showed similar findings, but diastolic function could not be adequately assessed. Anticoagulation: Compliant, denies bleeding problems. Obesity: Weight continues to increase steadily.  He, she seems to have given up on her efforts at weight loss. OSA: Compliant with CPAP.  Denies daytime hypersomnolence. IPF:  On Ofev. follow-up with Dr. Chase Caller.    Medication Adjustments/Labs and Tests Ordered: Current medicines are reviewed at length  with the patient today.  Concerns regarding medicines are outlined above.  Medication changes, Labs and Tests ordered today are listed in the Patient Instructions below. Patient Instructions  Medication Instructions:  START Metoprolol Tartrate 25 mg twice daily  *If you need a refill on your cardiac medications before your next appointment, please call your pharmacy*   Lab Work: None ordered If you have labs (blood work) drawn today and your tests are completely normal, you will receive your results only by: [place] (if you have MyChart) OR A paper copy in the mail If you have any lab test that is abnormal or we need to change your treatment, we will call you to review the results.   Testing/Procedures: None ordered   Follow-Up: At [facility], you and your health needs are our priority.  As part of our continuing mission to provide you with exceptional heart care, we have created designated Provider Care Teams.  These Care Teams include your primary Cardiologist (physician) and Advanced Practice Providers (APPs -  Physician Assistants and Nurse Practitioners) who all work together to provide you with the care you need, when you need it.  We recommend signing up for the patient portal called "MyChart".  Sign up information is provided on this After Visit Summary.  MyChart is used to connect with patients for Virtual Visits (Telemedicine).  Patients are able to view  lab/test results, encounter notes, upcoming appointments, etc.  Non-urgent messages can be sent to your provider as well.   To learn more about what you can do with MyChart, go to [email or web address].    Your next appointment:   6 month(s)  The format for your next appointment:   In Person  Provider:   Sanda Klein, MD     Other Instructions A referral has been placed to Dr. Curt Bears   Signed, Sanda Klein, MD  10/28/2021 3:22 PM    [address], [place], [place]  [address] Phone: [phone]; Fax: [phone]

## 2021-10-22 NOTE — Patient Instructions (Signed)
Medication Instructions:  START Metoprolol Tartrate 25 mg twice daily  *If you need a refill on your cardiac medications before your next appointment, please call your pharmacy*   Lab Work: None ordered If you have labs (blood work) drawn today and your tests are completely normal, you will receive your results only by: [place] (if you have MyChart) OR A paper copy in the mail If you have any lab test that is abnormal or we need to change your treatment, we will call you to review the results.   Testing/Procedures: None ordered   Follow-Up: At [facility], you and your health needs are our priority.  As part of our continuing mission to provide you with exceptional heart care, we have created designated Provider Care Teams.  These Care Teams include your primary Cardiologist (physician) and Advanced Practice Providers (APPs -  Physician Assistants and Nurse Practitioners) who all work together to provide you with the care you need, when you need it.  We recommend signing up for the patient portal called "MyChart".  Sign up information is provided on this After Visit Summary.  MyChart is used to connect with patients for Virtual Visits (Telemedicine).  Patients are able to view lab/test results, encounter notes, upcoming appointments, etc.  Non-urgent messages can be sent to your provider as well.   To learn more about what you can do with MyChart, go to [email or web address].    Your next appointment:   6 month(s)  The format for your next appointment:   In Person  Provider:   Sanda Klein, MD     Other Instructions A referral has been placed to Dr. Curt Bears

## 2021-10-28 ENCOUNTER — Encounter: Payer: Self-pay | Admitting: Cardiovascular Disease

## 2021-11-08 ENCOUNTER — Telehealth: Payer: Self-pay | Admitting: *Deleted

## 2021-11-08 NOTE — Telephone Encounter (Signed)
Eliquis assistance faxed to [facility]

## 2021-11-09 ENCOUNTER — Ambulatory Visit (INDEPENDENT_AMBULATORY_CARE_PROVIDER_SITE_OTHER): Payer: Medicare Other | Admitting: Internal Medicine

## 2021-11-09 ENCOUNTER — Other Ambulatory Visit (INDEPENDENT_AMBULATORY_CARE_PROVIDER_SITE_OTHER): Payer: Medicare Other

## 2021-11-09 ENCOUNTER — Encounter: Payer: Medicare Other | Admitting: Cardiovascular Disease

## 2021-11-09 ENCOUNTER — Other Ambulatory Visit: Payer: Self-pay

## 2021-11-09 ENCOUNTER — Encounter: Payer: Self-pay | Admitting: Internal Medicine

## 2021-11-09 VITALS — BP 136/82 | HR 96 | Temp 98.1°F | Ht 67.0 in | Wt 277.4 lb

## 2021-11-09 DIAGNOSIS — Z5181 Encounter for therapeutic drug level monitoring: Secondary | ICD-10-CM | POA: Diagnosis not present

## 2021-11-09 DIAGNOSIS — J84112 Idiopathic pulmonary fibrosis: Secondary | ICD-10-CM | POA: Diagnosis not present

## 2021-11-09 DIAGNOSIS — K625 Hemorrhage of anus and rectum: Secondary | ICD-10-CM | POA: Diagnosis not present

## 2021-11-09 LAB — HEPATIC FUNCTION PANEL
ALT: 14 U/L (ref 0–35)
AST: 18 U/L (ref 0–37)
Albumin: 3.7 g/dL (ref 3.5–5.2)
Alkaline Phosphatase: 78 U/L (ref 39–117)
Bilirubin, Direct: 0.1 mg/dL (ref 0.0–0.3)
Total Bilirubin: 0.4 mg/dL (ref 0.2–1.2)
Total Protein: 7.1 g/dL (ref 6.0–8.3)

## 2021-11-09 NOTE — Progress Notes (Signed)
_0  ID: [name] [name], female    DOB: 05/01/1941, 80 y.o.   MRN: [id number]  Chief Complaint  Patient presents with   Follow-up    Pt. Says she's had some bleeding.     Referring provider: [name], MD  HPI: 80 year old female former smoker quit in 1971.  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 Patient of Dr. Chase Caller, last seen by pulmonary NP on 04/10/21.   Previous [facility] pulmonary encounter: 04/10/2021 follow-up; Bronchitis, Pulmonary fibrosis Patient returns for a follow-up visit, patient was seen last week for cough, congestion, shortness of breath and chills.  COVID-19 testing was negative.  Patient had been fully vaccinated for COVID-19.  She had no increased oxygen demands.  Patient was given doxycycline for 7 days and prednisone 20 mg for 5 days. Patient complains ongoing cough and congestion , mucus remains green on/off. No fever.  Eating well , no n/v.d. No hemoptysis , orthopnea or edema.  Called in over the weekend and was called in steroid burst. Currently on Prednisone 22m daily .  Chest xray showed chronic changes with no acute process.  [place]   04/17/2021 Patient presents today follow-up with PFTs. She did not have PFTs done today because she is still not feeling well. She completed course of doxycycline and has two days left of prednisone. Reports "cold symptoms" described as sinus drainage and cough. Sputum has not real purulence. Energy level is low. States that she feels her head is not right. She is using Flonase ans Claritin. She is short of breath. She completed doxycycline twice with no improvement. She is taking mucinex twice a day. She has not used albuterol rescue inhaler. Her ex husband died two weeks ago, she found out two days ago. Denies depressed feelings.    05/15/2021 -  Dr. RChase CallerType of  visit: Telephone/Video [name]89y.o. - changed face to face visit to telephone visit due to > 47F heat. Wantd to know PFT resuts June 2022 copared to sept 2021. FVC down 1.6%, DLCO down 2.1% (compared to y  2years ago FVC declined 7.7%). On Esbeit since Dec 2021/Jan 2022.  Says Dr C sarted on her ditliazem 04/18/21 and after that esbriet became more intolerable. She had constipationa and retain fluids.  So, she stopped all her medications except  eliquis and anti depressant and anti histamine. This means she stopped esbriet approx a week ago. Says that for several days still not feel better though better today. She is unsure if today is a good day v bad day. But overall feels A Fib is acting up. She wants to restarrtrt esbriet after good w wash out.   She is now using o2 at night but feels is not helping energy levels at day time. I Advised she could return it and then she said is actually helping her and does not want to return it.    08/14/21- Dr. RChase CallerChief Complaint  Patient presents with   Follow-up    Pt states she has been doing okay since last visit. Still becomes SOB with exertion.     [name]89y.o. -returns for follow-up.  Since her last visit because she was having calls with side effects from pirfenidone.  She stopped it.  She says after that she started feeling better.  Despite this she is  reporting progressive decline in dyspnea.  Dyspnea score is listed below.  She uses oxygen with exertion as needed.  I at night she uses CPAP without oxygen.  She is frustrated with pirfenidone.  She asked about the alternative nintedanib.  She recollects that there constraints with it in the setting of heart disease and anticoagulation.  Explained that one of the study showed 0.5% increase incidence and MI with nintedanib.  But not the other studies.  Explained that surgical mechanism of anticoagulation but clinically patient have tolerated.  Explained about GI side effects  particularly diarrhea.  She is nervous and hesitant but finally we took a shared decision making that she would start it at a lower dose given the fact it can be beneficial in reducing risk of progression.  We also discussed various aspects of clinical trials documented below.  She is interested in this as long as it is a medication with a much better side effect profile.  But at this point in time we took a shared decision making to go with standard of care therapy first.  She will have the flu shot today.  She is very nervous about getting mRNA COVID booster because of the side effect profile.  She had significant side effects apparently.  She also had omicron COVID in January 2022 and she is wondering about natural immunity.  However she is aware is COVID everywhere currently.  We took a shared decision making to check IgG antibody levels and adjust the risk threshold accordingly.  09/18/2021- Interim hx  Patient presents today for acute visit for medication reaction to OFEV. Patient called our office yesterday with reports of rectal bleeding and abdominal pain since starting 09/17/21. She is newly on OFEV since 10/13 and is also on Eliquis. Our office advised her to stop OFEV. Rectal bleeding has since resolved, she still has some slight lower abdomen discomfort described as "bloating".  Breathing wise she is stable. She uses POC 2L with exertion as needed.  She is on oxygen at night with her CPAP. Denies f/c/s, diarrhea, nausea or vomitting.     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  Small pulmonary nodules measuring 5 mm or less stable dating back to 2018 consistent with a benign etiology.  OV 09/26/2021  Subjective:  [name], female , DOB: 1941-08-27 , age 27 y.o. , MRN: [id number] , ADDRESS: [address] [place] [address] PCP [name], MD Patient Care Team: [name], MD as  PCP - General (Family Medicine) Sanda Klein, MD as PCP - Cardiology (Cardiology) Maisie Fus, MD as Consulting Physician (Obstetrics and Gynecology) Richmond Campbell, MD as Consulting Physician (Gastroenterology) Croitoru, Dani Gobble, MD as Consulting Physician (Cardiology) Kathie Rhodes, MD (Inactive) as Consulting Physician (Urology) Danella Sensing, MD as Consulting Physician (Dermatology) Brand Males, MD as Consulting Physician (Pulmonary Disease) Bo Merino, MD as Consulting Physician (Rheumatology) Barbaraann Cao, OD as Referring Physician (Optometry) Berenice Primas, MD as Referring Physician (Orthopedic Surgery)  This Provider for this visit: Treatment Team:  Attending Provider: Brand Males, MD    09/26/2021 -   Chief Complaint  Patient presents with   Follow-up    PFT performed today.  Pt states she has been doing okay since last visit.   Follow-up IPF   - sept 09, 2021 update - IPF dx givien: age greater than 4, only trace positive ANAdescription of small hiatal hernia on CT  scan, the type of pattern on the CT scan called probable UIP, autoimmune antibodies negative last year    - last CT July 2021   -last PFT sept 201  - portable o2 since sept 2021  -esbriet start end sept 2021 -> stopped 11/02/20 due to fatigue -> rstar 11/09/2020 -. Max dose 2 pilll tid since Dec 02, 2020 -> intermittent start/stop summer 2022 -> stopped aug 2022  - Overnight pulse oximetry done on 03/12/2021 shows time spent less than equal to 88% is 40.3 minutes.  Average pulse was 60/min.  Time spent in bradycardia 72.5%. Start 2 L nasal cannula at night  Multiple Lung Nodules 65m- stable 203/11/2017-> July 2021 Alpha-1 MZ but does not have emphysema on CT 212-Mar-2021  [Daughter died from alpha-1 CC] Obesity  Diastolic dysfunction Atrial fibrillation on Eliquis Obstructive sleep apnea on CPAP without oxygen COVID-19 incidental 12/28/2020 - likely Omicron4  HPI BAURIELLA [id number].o.  -presents ith daughter ? SJeralyn Ruthsmeeting first time. Doing stable from symptom stand point resp wise but PFT down significantly. Took ofev for 5 days and there are aspectso of taking ofev (1037mbid - the lower dose) tht she liked. She felt her urinary stream had better flow (not a known side effect). She had lower appetite (known Se with ofev) but she liked the curb on appetie it due to her obesity. The 5d into she had had mild discomfort in suprapubic area (known SE for abd discmofort) and had 1 drop of painless rectal bleed. So stopped ofev (also on eliquisl bleeding theoertical risk with ofev). After stopping ofev, urinary flow is back to baseline(does not like it), and no mpre rectal bleeding . She never had rectal bleeding before and afer. She again stated and made clear the so called bleeding was "1 drop of blood ont he floor". Associted with ofev intake on background of eliquis and associted with suprapubic discomfor. No dysuria. Dr MaBrien Matess her GI. Last colonoscopy was when she ws [age] was normal apparently  Discussed  - seeing a GI doc for rectal bleed - she is agreeable   - discussed rechallenge with ofev v joining clinical trial phase 3( to be volunteer + exploit therapeutic advantage with 50% chance of placebo) Discussed other asoects of trials. Explained the [id number] study with IV MAB against CTGF Pamrevulumab v placeo.  See below. She and dauhger want her to do clinical trial but are more enthused about the idea of a ofev rechallenge first. We discussed that rechallenge liketly to cause side effects but anecdotally seems some patients who do not have issues. She is not interested in esbriet rechallenge   1. Scientific Purpose  Clinical research is designed to produce generalizable knowledge and to answer questions about the safety and efficacy of intervention(s) under study in order to determine whether or not they may be useful for the care of future patients.  2. Study  Procedures  Participation in a trial may involve procedures or tests, in addition to the intervention(s) under study, that are intended only or primarily to generate scientific knowledge and that are otherwise not necessary for patient care.   3. Uncertainty  For intervention(s) under study in clinical research, there often is less knowledge and more uncertainty about the risks and benefits to a population of trial participants than there is when a doctor offers a patient standard interventions.   4. Adherence to Protocol  Administration of the intervention(s) under study is typically based  on a strict protocol with defined dose, scheduling, and use or avoidance of concurrent medications, compared to administration of standard interventions.  5. Clinician as Investigator  Clinicians who are in health care settings provide treatment; in a clinical trial setting, they are also investigating safety and efficacy of an intervention. In otherwise your doctor or nurse practitioner can be wearing 2 hats - one as care giver another as Company secretary  6. Patient as Visual merchandiser Subject  Patients participating in research trials are research subjects or volunteers. In other words participating in research is 100% voluntary and at one's own free weill. The decision to participate or not participate will NOT affect patient care and the doctor-patient relationship in any way  7. Financial Conflict of Interest Disclosure  One or more of the investigators of  the research trial might have an investment interest in [facility] the clinical trials practice through which the study is being conducted. Study sponsor pays the practice for the conduct of the study.          OV 10/10/2021  Subjective:  [name], female , DOB: Feb 24, 1941 , age 49 y.o. , MRN: [id number] , ADDRESS: [place] [address] PCP [name], MD Patient Care  Team: [name], MD as PCP - General (Family Medicine) Croitoru, Dani Gobble, MD as PCP - Cardiology (Cardiology) Maisie Fus, MD as Consulting Physician (Obstetrics and Gynecology) Richmond Campbell, MD as Consulting Physician (Gastroenterology) Croitoru, Dani Gobble, MD as Consulting Physician (Cardiology) Kathie Rhodes, MD (Inactive) as Consulting Physician (Urology) Danella Sensing, MD as Consulting Physician (Dermatology) Brand Males, MD as Consulting Physician (Pulmonary Disease) Bo Merino, MD as Consulting Physician (Rheumatology) Barbaraann Cao, OD as Referring Physician (Optometry) Berenice Primas, MD as Referring Physician (Orthopedic Surgery)  This Provider for this visit: Treatment Team:  Attending Provider: Brand Males, MD    10/10/2021 -   Chief Complaint  Patient presents with   Follow-up    Patient states that  she is doing good with the Ofev and is currently not having any side effects. States that she has been coughing a lot lately. Wants to know if she is due for pneumonia vaccine.   Type of visit: Telephone/Video Circumstance: COVID-19 national emergency Identification of patient BRYLEI PEDLEY with [birth year] and MRN [id number] - 2 person identifier Risks: Risks, benefits, limitations of telephone visit explained. Patient understood and verbalized agreement to proceed Anyone else on call: just patient Patient location: her home This provider location: [address] [place], [place] [address]   HPI LONDIN ANTONE 80 y.o. -at this point telephone visit is to see how rechallenge with nintedanib is ongoing.  Since 10/06/2021 Saturday she is on 100 mg twice daily.  She says she has not had any recurrence of bleeding.  No GI symptoms.  Tolerating it well.  Dyspnea is not any worse.  No new issues.  Noticed mild reduction in GFR in October 2022.  Liver function test and hemoglobin are fine.             OV 11/09/2021  Subjective:  Patient ID:  [name] [name], female , DOB: Oct 31, 1941 , age 43 y.o. , MRN: [id number] , ADDRESS: [place] [address] PCP [name], MD Patient Care Team: [name], MD as PCP - General (Family Medicine) Sanda Klein, MD as PCP - Cardiology (Cardiology) Maisie Fus, MD as Consulting Physician (Obstetrics and Gynecology) Richmond Campbell, MD as  Consulting Physician (Gastroenterology) Croitoru, Dani Gobble, MD as Consulting Physician (Cardiology) Kathie Rhodes, MD (Inactive) as Consulting Physician (Urology) Danella Sensing, MD as Consulting Physician (Dermatology) Brand Males, MD as Consulting Physician (Pulmonary Disease) Bo Merino, MD as Consulting Physician (Rheumatology) Barbaraann Cao, OD as Referring Physician (Optometry) Berenice Primas, MD as Referring Physician (Orthopedic Surgery)  This Provider for this visit: Treatment Team:  Attending Provider: Brand Males, MD    11/09/2021 -   Chief Complaint  Patient presents with   Follow-up    Pt states she is about the same since last visit. Still becomes SOB with exertion.    Follow-up IPF   - sept 09, 2021 update - IPF dx givien: age greater than 20, only trace positive ANAdescription of small hiatal hernia on CT scan, the type of pattern on the CT scan called probable UIP, autoimmune antibodies negative last year    - last CT July 2021   -last PFT October 2022  - portable o2 since sept 2021  -esbriet start end sept 2021 -> stopped 11/02/20 due to fatigue -> rstar 11/09/2020 -. Max dose 2 pilll tid since Dec 02, 2020 -> intermittent start/stop summer 2022 -> stopped aug 2022  - Overnight pulse oximetry done on 03/12/2021 shows time spent less than equal to 88% is 40.3 minutes.  Average pulse was 60/min.  Time spent in bradycardia 72.5%. Start 2 L nasal cannula at night  Multiple Lung Nodules 17m- stable 217-Feb-2018-> July 2021 Alpha-1 MZ but does not have emphysema on CT 202/17/2021  [Daughter died  from alpha-1 CC] Obesity  Diastolic dysfunction Atrial fibrillation on Eliquis Obstructive sleep apnea on CPAP without oxygen COVID-19 incidental 12/28/2020 - likely Omicron4  HPI BKEALI [id number].o. -returns for follow-up.  She restarted nintedanib on 10/06/2021 at the lower dose.  She tells me that she has not had any rectal bleeding except yesterday there was again a few drops.  No abdominal pain.  Last visit I told her to go see GI doctor but she has not.  Her GI doctor was Dr. BCristina Gongin EPaynebut has since retired.  Advised that that I will make a referral.  In terms of shortness of breath she is stable.  She believes she is tolerating the nintedanib fine.  She is actually gained some weight.  She also reports that for the last 2 weeks she is on metoprolol because of cardiac rhythm issues.  At today blood pressure was 1962systolic..Marland Kitchen She tells me this is high for her.  She has never had this problem before according to history.  But the observations only today.  She acknowledges that this could be whitecoat hypertension as well.  Last CT scan of the chest July 2021 Last pulmonary function test October 2022  She is not fully compliant with the portable oxygen.        SYMPTOM SCALE - ILD 07/21/2019   08/10/2020 262# 09/26/2020 Last Weight  Most recent update: 09/26/2020  1:42 PM      Weight  120.9 kg (266 lb 9.6 oz)                     11/09/2020 274# 01/23/2021 279# 08/14/2021 275# 09/18/2021 Started OFEV 10/13 but stoped 10/17 d/t GIB  09/26/2021 Off ofev. No more "rectal bleed" 11/09/2021 Back on ofev 1057mbid since 11/5/2  280#  O2 use ra ra ra o2 with ex 02 with ex   O2 with exertoin  Shortness of Breath 0 -> 5 scale with 5 being worst (score 6 If unable to do)               At rest 0 0   5 0 4 0 0 0  Simple tasks - showers, clothes change, eating, shaving _0 3.5 4  Household (dishes, doing bed, laundry) _1 Shopping _2 4.5 4  4.5  Walking level at own pace _3 4.5  Walking up Stairs _4 Total (40 - 48) Dyspnea Score _5 23.5 22.5 23  How bad is your cough? 1 1-_6 How bad is your fatigue _7 3.5 3  nausea   0 0 0 0 0 0 0 0  vomit   0 0 0 0 0 0 0 0  diarrhea   0 0 0 1 0 0.5 0 00  axiety   2 1 0 0 0.5 0.5 0 0  depression   2 1._8 0.5-1 1        Simple office walk 185 feet x  3 laps goal with forehead probe 07/21/2019   08/10/2020   01/23/2021   09/18/2021    O2 used ra ra ra RA   Number laps completed 3 laps _9 Comments about pace modertae with walker Slow pace Sow pace Slow pace   Resting Pulse Ox/HR 97% and 78/min 100% and 74/min 94%and HR 92 96% RA and HR 80   Final Pulse Ox/HR 93% and 109/min 77% and 85/min 90 % and HR 100 90% and HR 125   Desaturated </= 88% no no no No   Desaturated <= 3% points Yes, 4 Yes, 13 points Yes, 4 piints Yes, 6 points   Got Tachycardic >/= 90/min you have pulmonary fibrosis otherwise call interstitial lung disease yes no yes Yes   Symptoms at end of test dyspnea Mild dyspnea Dyspnea throughtou Dyspnea    Miscellaneous comments x Corrected with 2LNC and got    X        CT Chest data  No results found.    PFT  PFT Results Latest Ref Rng & Units 09/26/2021 05/07/2021 08/10/2020 07/19/2019 02/24/2017  FVC-Pre L 2.38 2.53 2.57 2.74 2.58  FVC-Predicted Pre % 79 84 84 93 85  FVC-Post L - - - 2.63 2.45  FVC-Predicted Post % - - - 89 81  Pre FEV1/FVC % % 87 86 86 86 82  Post FEV1/FCV % % - - - 90 87  FEV1-Pre L 2.06 2.17 2.22 2.36 2.12  FEV1-Predicted Pre % 92 97 97 107 93  FEV1-Post L - - - 2.35 2.14  DLCO uncorrected ml/min/mmHg 9.76 15.50 15.83 16.52 16.15  DLCO UNC% % 47 74 76 81 59  DLCO corrected ml/min/mmHg 9.48 15.50 15.34 - 14.93  DLCO COR %Predicted % 45 74 73 - 55  DLVA Predicted % 77 105 97 104 79  TLC L - - 4.50 3.97 -  TLC % Predicted % - - 81 74 -  RV % Predicted % - - 71 52 -        has a past medical history of  Alpha-1-antitrypsin deficiency carrier, Arthritis, Atherosclerosis of aorta (HCC), Atrial fibrillation ([place]), Back pain, Complication of anesthesia, Diastolic dysfunction, Frequent UTI, GERD (gastroesophageal reflux disease), Hypertension, Hypertriglyceridemia, Insulin resistance, Long term current use of anticoagulant, Multiple lung nodules, Muscular deconditioning, MVP (mitral valve prolapse), Obesity, OSA on CPAP, Osteoarthritis, Osteopenia, Pacemaker, PAF (paroxysmal atrial fibrillation) ([place]), Positive ANA (antinuclear antibody), Presbyesophagus, Presence of permanent cardiac pacemaker, Primary osteoarthritis of both hands, Pulmonary fibrosis ([place]), PVC's (premature ventricular contractions), Shortness of breath, Sinus arrest (10/2014), Sleep apnea, Stroke ([place]) (01/2014), Tachy-brady syndrome ([place]) (10/2014), TIA (transient ischemic attack), and Vitamin D deficiency.   reports that she quit smoking about 51 years ago. Her smoking use included cigarettes. She has a 36.00 pack-year smoking history. She has never used smokeless tobacco.  Past Surgical History:  Procedure Laterality Date   CARDIOVERSION N/A 09/02/2017   Procedure: CARDIOVERSION;  Surgeon: Sanda Klein, MD;  Location: [facility];  Service: Cardiovascular;  Laterality: N/A;   COLONOSCOPY  2019   ESOPHAGEAL DILATION     EXCISION VAGINAL CYST     benign nodules   INSERT / REPLACE / REMOVE PACEMAKER  10/04/2014   Medtronic Advisa model A2DR01 serial number [id number]   LOOP RECORDER EXPLANT N/A 10/04/2014   Procedure: LOOP RECORDER EXPLANT;  Surgeon: Sanda Klein, MD;  Location: [facility];  Service: Cardiovascular;  Laterality: N/A;   LOOP RECORDER IMPLANT N/A 04/19/2014   Procedure: LOOP RECORDER IMPLANT;  Surgeon: Sanda Klein, MD;  Location: [facility];  Service: Cardiovascular;  Laterality: N/A;   NM MYOCAR PERF WALL MOTION  12/18/2007   normal   PERMANENT PACEMAKER INSERTION N/A  10/04/2014   Procedure: PERMANENT PACEMAKER INSERTION;  Surgeon: Sanda Klein, MD;  Location: [facility];  Service: Cardiovascular;  Laterality: N/A;   TUBAL LIGATION     US ECHOCARDIOGRAPHY  05/15/2010   LA mildly dilated,mild mitral annular ca+, AOV mildly sclerotic, mild asymmetric LVH    Allergies  Allergen Reactions   Ancef [Cefazolin] Hives and Itching   Pseudoephedrine Hcl     Other reaction(s): palpitations (moderate)   Ergotamine    Other Other (See Comments)   Pentobarbital    Pirfenidone    Caffeine Palpitations    Immunization History  Administered Date(s) Administered   Fluad Quad(high Dose 65+) 08/14/2021   Influenza Split 10/09/2009, 09/19/2010, 09/13/2020   Influenza Whole 09/23/2008   Influenza, High Dose Seasonal PF 09/13/2018, 09/27/2019, 09/26/2020   Influenza,inj,quad, With Preservative 09/04/2017   Influenza-Unspecified 09/17/2014, 09/09/2016, 09/04/2017   Moderna Sars-Covid-2 Vaccination 12/06/2019, 01/03/2020   Pneumococcal Conjugate-13 09/29/2014, 10/16/2015   Pneumococcal Polysaccharide-23 04/06/2009, 10/02/2017   Pneumococcal-Unspecified 10/01/2014, 10/02/2017   Td 04/05/1999   Tdap 04/06/2009   Zoster Recombinat (Shingrix) 09/29/2018, 01/19/2019   Zoster, Live 12/02/2008, 09/29/2018, 01/19/2019    Family History  Problem Relation Age of Onset   Heart attack Mother    Sudden death Mother    Stroke Father    Heart failure Father    Alcoholism Brother    Healthy Sister    Stroke Brother    Alpha-1 antitrypsin deficiency Daughter    Breast cancer Daughter    Prostate cancer Brother    Stroke Brother    Prostate cancer Brother    Healthy Brother    Prostate cancer Brother    Healthy Daughter    Diabetes Son    Arthritis Son      Current Outpatient Medications:    Acetaminophen (TYLENOL ARTHRITIS PAIN PO), Take 2 tablets by mouth in the morning  and at bedtime., Disp: , Rfl:    apixaban (ELIQUIS) 5 MG TABS tablet, Take 1 tablet (5 mg  total) by mouth 2 (two) times daily., Disp: 180 tablet, Rfl: 3   B Complex-C (SUPER B COMPLEX PO), Take 1 tablet by mouth daily., Disp: , Rfl:    Biotin 5000 MCG CAPS, Take 5,000 mcg by mouth daily., Disp: , Rfl:    Calcium Carbonate-Vit D-Min (CALCIUM 1200 PO), Take 1,200 mg by mouth daily. , Disp: , Rfl:    cholecalciferol (VITAMIN D) 1000 UNITS tablet, Take 6,000 Units by mouth daily. , Disp: , Rfl:    Co-Enzyme Q10 100 MG CAPS, Take 100 mg by mouth daily. , Disp: , Rfl:    diazepam (VALIUM) 5 MG tablet, TAKE 1 TABLET EACH DAY. (Patient taking differently: Take 5 mg by mouth daily as needed for anxiety. TAKE 1 TABLET EACH DAY.), Disp: 30 tablet, Rfl: 0   fluticasone (FLONASE) 50 MCG/ACT nasal spray, Place 2 sprays into both nostrils as needed for allergies or rhinitis., Disp: , Rfl:    folic acid (FOLVITE) 431 MCG tablet, Take 400 mcg by mouth daily., Disp: , Rfl:    glucosamine-chondroitin 500-400 MG tablet, Take 1 tablet by mouth 2 (two) times daily. , Disp: , Rfl:    loratadine (CLARITIN) 10 MG tablet, Take 10 mg by mouth daily., Disp: , Rfl:    MAGNESIUM CITRATE PO, Take 400 mg by mouth daily. 1 tab daily, Disp: , Rfl:    metoprolol tartrate (LOPRESSOR) 25 MG tablet, Take 1 tablet (25 mg total) by mouth 2 (two) times daily., Disp: 180 tablet, Rfl: 3   Multiple Vitamins-Minerals (ZINC PO), , Disp: , Rfl:    Nintedanib (OFEV) 100 MG CAPS, Take 1 capsule (100 mg total) by mouth 2 (two) times daily., Disp: 180 capsule, Rfl: 1   nitroGLYCERIN (NITROSTAT) 0.4 MG SL tablet, Place 1 tablet (0.4 mg total) under the tongue every 5 (five) minutes as needed for chest pain., Disp: 25 tablet, Rfl: 1   omeprazole (PRILOSEC) 40 MG capsule, Take 1 capsule by mouth daily., Disp: , Rfl:    Resveratrol 250 MG CAPS, Take 250 mg by mouth daily., Disp: , Rfl:    sertraline (ZOLOFT) 100 MG tablet, TAKE 1 TABLET EACH DAY., Disp: 90 tablet, Rfl: 0      Objective:   Vitals:   11/09/21 0907  BP: 136/82  Pulse:  96  Temp: 98.1 F (36.7 C)  TempSrc: Oral  SpO2: 97%  Weight: 277 lb 6.4 oz (125.8 kg)  Height: _0  (1.702 m)    Estimated body mass index is 43.45 kg/m as calculated from the following:   Height as of this encounter: _1  (1.702 m).   Weight as of this encounter: 277 lb 6.4 oz (125.8 kg).  _2 @  [facility] Weights   11/09/21 0907  Weight: 277 lb 6.4 oz (125.8 kg)     Physical Exam  General: No distress. obese Neuro: Alert and Oriented x 3. GCS 15. Speech normal Psych: Pleasant Resp:  Barrel Chest - no.  Wheeze - no, Crackles - mild crackles at bas, No overt respiratory distress CVS: Normal heart sounds. Murmurs - no Ext: Stigmata of Connective Tissue Disease - no HEENT: Normal upper airway. PEERL +. No post nasal drip        Assessment:       ICD-10-CM   1. IPF (idiopathic pulmonary fibrosis) (HCC)  J84.112 Hepatic function panel    Pulmonary function test  2. Medication monitoring encounter  Z51.81 Hepatic function panel    3. Rectal bleeding  K62.5 Ambulatory referral to Gastroenterology         Plan:     Patient Instructions  IPF (idiopathic pulmonary fibrosis) ([place]) Medication monitoring encounter  - glad  overall you are toleratnig rechallenge of ofev 173m twice daily (lower dose regimen only for you) since 10/06/21   Plan  -  - continue ofev 1087mtwice daily - check LFT 11/09/2021 - check spirometry/dlco in 2 months  Rectal bleeding  - 1 more recurrence 11/08/21 since ofev rechallenge on 10/06/21  Plan  - refer Dr KaAcie FredricksonI doc because this happened two times now - so is important you get it addressed  High blood pressure   - slightly high at visit 11/09/2021 - 136/82  Plan  - home monitoring  - work with PCP MiKathyrn LassMD - need trend to see if related to ofev or essential hypertension - for now continue ofev   Followup - 2-3 months do spirometry and dlco - return for 30 min visith wth DR RaChase Callern 2-3 months  but after PFT  - walk test and symptoms questionnaire at followup    SIGNATURE    Dr. MuBrand MalesM.D., F.C.C.P,  Pulmonary and Critical Care Medicine Staff Physician, CoKirkwoodirector - Interstitial Lung Disease  Program  Pulmonary Fi[place] [id number]: [phone] no answer or between  15:00h - 7:00h: call [phone] Telephone: [phone]  9:32 AM 11/09/2021

## 2021-11-09 NOTE — Patient Instructions (Addendum)
IPF (idiopathic pulmonary fibrosis) ([place]) Medication monitoring encounter  - glad  overall you are toleratnig rechallenge of ofev 157m twice daily (lower dose regimen only for you) since 10/06/21   Plan  -  - continue ofev 1027mtwice daily - check LFT 11/09/2021 - check spirometry/dlco in 2 months  Rectal bleeding  - 1 more recurrence 11/08/21 since ofev rechallenge on 10/06/21  Plan  - refer Dr KaAcie FredricksonI doc because this happened two times now - so is important you get it addressed  High blood pressure   - slightly high at visit 11/09/2021 - 136/82  Plan  - home monitoring  - work with PCP MiKathyrn LassMD - need trend to see if related to ofev or essential hypertension - for now continue ofev   Followup - 2-3 months do spirometry and dlco - return for 30 min visith wth DR RaChase Callern 2-3 months but after PFT  - walk test and symptoms questionnaire at followup

## 2021-11-21 DIAGNOSIS — Z1389 Encounter for screening for other disorder: Secondary | ICD-10-CM | POA: Diagnosis not present

## 2021-11-21 DIAGNOSIS — Z0000 Encounter for general adult medical examination without abnormal findings: Secondary | ICD-10-CM | POA: Diagnosis not present

## 2021-11-22 ENCOUNTER — Encounter: Payer: Medicare Other | Admitting: Cardiology

## 2021-12-04 DIAGNOSIS — Z20822 Contact with and (suspected) exposure to covid-19: Secondary | ICD-10-CM | POA: Diagnosis not present

## 2021-12-10 ENCOUNTER — Telehealth: Payer: Self-pay | Admitting: Internal Medicine

## 2021-12-10 DIAGNOSIS — J84112 Idiopathic pulmonary fibrosis: Secondary | ICD-10-CM

## 2021-12-10 MED ORDER — OFEV 100 MG PO CAPS: 100.0000 mg | ORAL_CAPSULE | Freq: Two times a day (BID) | ORAL | 3 refills | Status: DC

## 2021-12-10 NOTE — Telephone Encounter (Signed)
Confirmed medication and pharmacy. Refill sent in. LM informing patient refill has been sent in.   Nothing further needed at this time.

## 2021-12-14 ENCOUNTER — Telehealth: Payer: Self-pay | Admitting: Internal Medicine

## 2021-12-14 DIAGNOSIS — R42 Dizziness and giddiness: Secondary | ICD-10-CM | POA: Diagnosis not present

## 2021-12-14 DIAGNOSIS — I499 Cardiac arrhythmia, unspecified: Secondary | ICD-10-CM | POA: Diagnosis not present

## 2021-12-14 DIAGNOSIS — Z743 Need for continuous supervision: Secondary | ICD-10-CM | POA: Diagnosis not present

## 2021-12-14 NOTE — Telephone Encounter (Signed)
Pt aware.

## 2021-12-14 NOTE — Telephone Encounter (Signed)
Spoke with the pt  She states woke up this morning and felt light headed  She checked her BP and it was 96/74  I advised her to call her PCP regarding her BP  She had denied any changes in her breathing and no new respiratory co's  She will call her PCP but wanted to let MR know about this

## 2021-12-14 NOTE — Telephone Encounter (Signed)
Yes PCP and persists/gets worse go to Urgent care/ER

## 2021-12-15 ENCOUNTER — Emergency Department (HOSPITAL_BASED_OUTPATIENT_CLINIC_OR_DEPARTMENT_OTHER)
Admission: EM | Admit: 2021-12-15 | Discharge: 2021-12-15 | Disposition: A | Discharge status: Home / Self Care | Payer: Medicare Other | Attending: Emergency Medicine | Admitting: Emergency Medicine

## 2021-12-15 ENCOUNTER — Other Ambulatory Visit: Payer: Self-pay

## 2021-12-15 ENCOUNTER — Emergency Department (HOSPITAL_BASED_OUTPATIENT_CLINIC_OR_DEPARTMENT_OTHER): Payer: Medicare Other

## 2021-12-15 ENCOUNTER — Encounter (HOSPITAL_BASED_OUTPATIENT_CLINIC_OR_DEPARTMENT_OTHER): Payer: Self-pay

## 2021-12-15 DIAGNOSIS — Z79899 Other long term (current) drug therapy: Secondary | ICD-10-CM | POA: Insufficient documentation

## 2021-12-15 DIAGNOSIS — I951 Orthostatic hypotension: Secondary | ICD-10-CM | POA: Insufficient documentation

## 2021-12-15 DIAGNOSIS — R42 Dizziness and giddiness: Secondary | ICD-10-CM | POA: Diagnosis present

## 2021-12-15 DIAGNOSIS — I4819 Other persistent atrial fibrillation: Secondary | ICD-10-CM | POA: Diagnosis not present

## 2021-12-15 DIAGNOSIS — I10 Essential (primary) hypertension: Secondary | ICD-10-CM | POA: Diagnosis not present

## 2021-12-15 DIAGNOSIS — R0602 Shortness of breath: Secondary | ICD-10-CM | POA: Insufficient documentation

## 2021-12-15 DIAGNOSIS — I700 Atherosclerosis of aorta: Secondary | ICD-10-CM | POA: Diagnosis not present

## 2021-12-15 DIAGNOSIS — R918 Other nonspecific abnormal finding of lung field: Secondary | ICD-10-CM | POA: Diagnosis not present

## 2021-12-15 LAB — CBC WITH DIFFERENTIAL/PLATELET
Abs Immature Granulocytes: 0.06 10*3/uL (ref 0.00–0.07)
Basophils Absolute: 0.1 10*3/uL (ref 0.0–0.1)
Basophils Relative: 1 %
Eosinophils Absolute: 0.4 10*3/uL (ref 0.0–0.5)
Eosinophils Relative: 4 %
HCT: 48.5 % — ABNORMAL HIGH (ref 36.0–46.0)
Hemoglobin: 15.6 g/dL — ABNORMAL HIGH (ref 12.0–15.0)
Immature Granulocytes: 1 %
Lymphocytes Relative: 22 %
Lymphs Abs: 2.4 10*3/uL (ref 0.7–4.0)
MCH: 27.5 pg (ref 26.0–34.0)
MCHC: 32.2 g/dL (ref 30.0–36.0)
MCV: 85.5 fL (ref 80.0–100.0)
Monocytes Absolute: 1.2 10*3/uL — ABNORMAL HIGH (ref 0.1–1.0)
Monocytes Relative: 11 %
Neutro Abs: 6.6 10*3/uL (ref 1.7–7.7)
Neutrophils Relative %: 61 %
Platelets: 286 10*3/uL (ref 150–400)
RBC: 5.67 MIL/uL — ABNORMAL HIGH (ref 3.87–5.11)
RDW: 13.8 % (ref 11.5–15.5)
WBC: 10.7 10*3/uL — ABNORMAL HIGH (ref 4.0–10.5)
nRBC: 0 % (ref 0.0–0.2)

## 2021-12-15 LAB — COMPREHENSIVE METABOLIC PANEL
ALT: 23 U/L (ref 0–44)
AST: 25 U/L (ref 15–41)
Albumin: 3.6 g/dL (ref 3.5–5.0)
Alkaline Phosphatase: 91 U/L (ref 38–126)
Anion gap: 7 (ref 5–15)
BUN: 25 mg/dL — ABNORMAL HIGH (ref 8–23)
CO2: 28 mmol/L (ref 22–32)
Calcium: 9.3 mg/dL (ref 8.9–10.3)
Chloride: 104 mmol/L (ref 98–111)
Creatinine, Ser: 1.21 mg/dL — ABNORMAL HIGH (ref 0.44–1.00)
GFR, Estimated: 45 mL/min — ABNORMAL LOW (ref >60)
Glucose, Bld: 113 mg/dL — ABNORMAL HIGH (ref 70–99)
Potassium: 4.8 mmol/L (ref 3.5–5.1)
Sodium: 139 mmol/L (ref 135–145)
Total Bilirubin: 0.4 mg/dL (ref 0.3–1.2)
Total Protein: 7 g/dL (ref 6.5–8.1)

## 2021-12-15 LAB — TROPONIN I (HIGH SENSITIVITY): Troponin I (High Sensitivity): 6 ng/L (ref <18)

## 2021-12-15 LAB — BRAIN NATRIURETIC PEPTIDE: B Natriuretic Peptide: 309.1 pg/mL — ABNORMAL HIGH (ref 0.0–100.0)

## 2021-12-15 NOTE — ED Provider Notes (Signed)
[facility] EMERGENCY DEPT Provider Note   CSN: [id number] Arrival date & time: 12/15/21  1015     History  No chief complaint on file.   [name] [name] is a 81 y.o. female.  Patient is an 81 year old female with a history of hypertension, pulmonary fibrosis on chronic oxygen therapy, atrial fibrillation on Eliquis, stroke who is presenting today with complaints of feeling lightheaded and dizzy.  Patient reports that this has been intermittent but is seem to be most pronounced yesterday.  Last night she was feeling extremely dizzy and lightheaded when she would go to stand up and even felt it a little bit when she was sitting down.  She checked her blood pressure and it was in the 76K systolic.  They did call 911 and she did drink some water and it did seem to improve but she was noted to be orthostatic on their exam.  She reports that she is always short of breath but has not felt that it is any worse recently.  She has not had any abdominal pain, nausea or vomiting.  She denies any syncope.  She has not recently started any new medications but to 2 months ago was put on a new medication for her pulmonary fibrosis which she reports until now she has been tolerating quite well.  She has not noticed any black or change in color of her stools.  She denies any abdominal pain.  She reports today she feels a little better than she did yesterday but with everything going on she felt that she needed to be checked out.  The history is provided by the patient and medical records.      Home Medications Prior to Admission medications   Medication Sig Start Date End Date Taking? Authorizing Provider  Acetaminophen (TYLENOL ARTHRITIS PAIN PO) Take 2 tablets by mouth in the morning and at bedtime.    [provider]  apixaban (ELIQUIS) 5 MG TABS tablet Take 1 tablet (5 mg total) by mouth 2 (two) times daily. 10/06/20   Erlene Quan, PA-C  B Complex-C (SUPER B COMPLEX PO) Take 1  tablet by mouth daily.    [provider]  Biotin 5000 MCG CAPS Take 5,000 mcg by mouth daily.    [provider]  Calcium Carbonate-Vit D-Min (CALCIUM 1200 PO) Take 1,200 mg by mouth daily.     [provider]  cholecalciferol (VITAMIN D) 1000 UNITS tablet Take 6,000 Units by mouth daily.     [provider]  Co-Enzyme Q10 100 MG CAPS Take 100 mg by mouth daily.     [provider]  diazepam (VALIUM) 5 MG tablet TAKE 1 TABLET EACH DAY. Patient taking differently: Take 5 mg by mouth daily as needed for anxiety. TAKE 1 TABLET EACH DAY. 09/20/20   Virgie Dad, MD  fluticasone (FLONASE) 50 MCG/ACT nasal spray Place 2 sprays into both nostrils as needed for allergies or rhinitis.    [provider]  folic acid (FOLVITE) 088 MCG tablet Take 400 mcg by mouth daily.    [provider]  glucosamine-chondroitin 500-400 MG tablet Take 1 tablet by mouth 2 (two) times daily.     [provider]  loratadine (CLARITIN) 10 MG tablet Take 10 mg by mouth daily.    [provider]  MAGNESIUM CITRATE PO Take 400 mg by mouth daily. 1 tab daily    [provider]  metoprolol tartrate (LOPRESSOR) 25 MG tablet Take 1 tablet (  25 mg total) by mouth 2 (two) times daily. 10/22/21 01/20/22  Croitoru, Mihai, MD  Multiple Vitamins-Minerals (ZINC PO)     [provider]  Nintedanib (OFEV) 100 MG CAPS Take 1 capsule (100 mg total) by mouth 2 (two) times daily. 12/10/21   Brand Males, MD  nitroGLYCERIN (NITROSTAT) 0.4 MG SL tablet Place 1 tablet (0.4 mg total) under the tongue every 5 (five) minutes as needed for chest pain. 09/19/20   Croitoru, Mihai, MD  omeprazole (PRILOSEC) 40 MG capsule Take 1 capsule by mouth daily. 02/02/21   [provider]  Resveratrol 250 MG CAPS Take 250 mg by mouth daily.    [provider]  sertraline (ZOLOFT) 100 MG tablet TAKE 1 TABLET EACH DAY. 02/26/21   Virgie Dad, MD       Allergies    Ancef [cefazolin], Pseudoephedrine hcl, Ergotamine, Other, Pentobarbital, Pirfenidone, and Caffeine    Review of Systems   Review of Systems  Physical Exam Updated Vital Signs BP (!) 86/61    Pulse 89    Temp (!) 97.4 F (36.3 C)    Resp (!) 24    Ht _0  (1.702 m)    Wt 122.5 kg    LMP  (LMP Unknown)    SpO2 100%    BMI 42.29 kg/m  Physical Exam Vitals and nursing note reviewed.  Constitutional:      General: She is not in acute distress.    Appearance: Normal appearance. She is well-developed.  HENT:     Head: Normocephalic and atraumatic.     Mouth/Throat:     Mouth: Mucous membranes are moist.  Eyes:     Pupils: Pupils are equal, round, and reactive to light.  Cardiovascular:     Rate and Rhythm: Normal rate and regular rhythm.     Heart sounds: Normal heart sounds. No murmur heard.   No friction rub.  Pulmonary:     Effort: Pulmonary effort is normal.     Breath sounds: Rales present. No wheezing.     Comments: Fine crackles consistent with pulmonary fibrosis noted in the bases Abdominal:     General: Bowel sounds are normal. There is no distension.     Palpations: Abdomen is soft.     Tenderness: There is no abdominal tenderness. There is no guarding or rebound.  Musculoskeletal:        General: No tenderness. Normal range of motion.     Cervical back: Normal range of motion and neck supple.     Right lower leg: No edema.     Left lower leg: No edema.     Comments: No edema  Skin:    General: Skin is warm and dry.     Findings: No rash.  Neurological:     Mental Status: She is alert and oriented to person, place, and time. Mental status is at baseline.     Cranial Nerves: No cranial nerve deficit.  Psychiatric:        Mood and Affect: Mood normal.        Behavior: Behavior normal.    ED Results / Procedures / Treatments   Labs (all labs ordered are listed, but only abnormal results are displayed) Labs Reviewed  CBC WITH  DIFFERENTIAL/PLATELET - Abnormal; Notable for the following components:      Result Value   WBC 10.7 (*)    RBC 5.67 (*)    Hemoglobin 15.6 (*)    HCT 48.5 (*)  Monocytes Absolute 1.2 (*)    All other components within normal limits  BRAIN NATRIURETIC PEPTIDE - Abnormal; Notable for the following components:   B Natriuretic Peptide 309.1 (*)    All other components within normal limits  COMPREHENSIVE METABOLIC PANEL - Abnormal; Notable for the following components:   Glucose, Bld 113 (*)    BUN 25 (*)    Creatinine, Ser 1.21 (*)    GFR, Estimated 45 (*)    All other components within normal limits  TROPONIN I (HIGH SENSITIVITY)  TROPONIN I (HIGH SENSITIVITY)    EKG EKG Interpretation  Date/Time:  Saturday December 15 2021 11:04:14 EST Ventricular Rate:  85 PR Interval:    QRS Duration: 70 QT Interval:  348 QTC Calculation: 414 R Axis:   80 Text Interpretation: Atrial fibrillation with occasional ventricular-paced complexes Nonspecific T wave abnormality When compared with ECG of 05-Oct-2014 05:02, 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 EXAM: PORTABLE CHEST 1 VIEW COMPARISON:  Chest x-ray 04/05/2021. FINDINGS: Lung volumes are normal. Opacity at the left lung base which may reflect areas of atelectasis and/or consolidation. No definite pleural effusions. Diffuse peribronchial cuffing and widespread interstitial prominence, similar to prior examinations. No pneumothorax. No evidence of pulmonary edema. Heart size is normal. Upper mediastinal contours are within normal limits. Atherosclerotic calcifications in the thoracic aorta. IMPRESSION: 1. Atelectasis and/or consolidation in the left lung base. 2. Diffuse peribronchial cuffing and  interstitial prominence, which appears to be chronic and is similar to the prior study. 3. Aortic atherosclerosis. Electronically Signed   By: Vinnie Langton M.D.   On: 12/15/2021 12:31    Procedures Procedures    Medications Ordered in ED Medications - No data to display  ED Course/ Medical Decision Making/ A&P                           Medical Decision Making Amount and/or Complexity of Data Reviewed External Data Reviewed: notes. Labs: ordered. Decision-making details documented in ED Course. Radiology: ordered and independent interpretation performed. Decision-making details documented in ED Course. ECG/medicine tests: ordered and independent interpretation performed. Decision-making details documented in ED Course.   Elderly female with multiple medical problems presenting today with symptoms of near syncope.  Patient was noted to have low blood pressure yesterday and orthostasis.  Today she reports feeling better.  She denies any new respiratory symptoms she has benign abdominal exam and does not appear to have any acute infectious abnormalities.  Patient just wants to be checked out.  Sats are 98% on 2 L which she is starting to wear all the time at home.  Low suspicion for COVID or pneumonia.  Concern for possible medication reaction versus anemia versus electrolyte abnormality or new acute renal issue.  Labs are pending.  I have independently interpreted patient's chest x-ray which appears chronic in nature but I do not appreciate any new consolidation.  Patient is consistently in atrial fibrillation today based on continuous cardiac monitoring while patient is in bed and also by my independent interpretation of her EKG and she reports that she did not think that he she was always in A. fib but she followed up with her cardiologist recently who also discussed with her that this might be causing some of  her fatigue and symptoms.  She does not appear excessively fluid overloaded today  and her heart rate is controlled in the 60s.  2:59 PM I independently evaluated patient's lab work and today her CBC shows no acute findings except some mild hemoconcentration with a hemoglobin of 15, CMP also no acute findings except for mild increase in creatinine to 1.21 for her baseline of 1.1.  Patient may have some mild dehydration but also feel that she is symptomatic from having persistent atrial fibrillation.  Again her heart rate is controlled at this time.  She had one blood pressure reading of 86/61 however feel that it was from an ill fitted cuff because when I adjusted it her blood pressure has been 116 over 60s.  She will continue her current medication but increase the amount of fluids that she is taking in.  Her BNP was minimally elevated at 300 without old to compare but again she has no significant evidence of fluid overload at this time and appears more depleted than overloaded.  Patient has follow-up with electro cardiologist on Friday with [facility].  At this time she appears stable for discharge.  She and her daughters questions were answered.  She does not appear to have any social barriers to discharge at this time.  She was given return precautions.        Final Clinical Impression(s) / ED Diagnoses Final diagnoses:  Orthostatic hypotension  Persistent atrial fibrillation [facility])    Rx / DC Orders ED Discharge Orders     None         Blanchie Dessert, MD 12/15/21 1502

## 2021-12-15 NOTE — ED Triage Notes (Addendum)
Pt presents with orthostatic lightheadedness. Pt states is was only present on standing. Pt denies any CP or new ShOB.   This triage note authored by Quita Skye, RN. Accidental log in under NT.

## 2021-12-15 NOTE — Discharge Instructions (Signed)
Continue your current medications today but make sure you are trying to drink 6 glasses of water per day.  The atrial fibrillation is causing some of your symptoms but you also appear dehydrated today.  The rest of your x-ray and labs look good.  It will be important for you to get up slowly and take your time.  Follow-up with the cardiologist as planned on Friday.  If you have issues with your blood pressure reading low multiple times have someone from your facility come check in person.  If you start having chest pain, severe shortness of breath, passing out you should return to the emergency room.

## 2021-12-18 ENCOUNTER — Telehealth: Payer: Self-pay | Admitting: Internal Medicine

## 2021-12-19 NOTE — Telephone Encounter (Addendum)
Returned call to patient. Phone went straight to VM. Patient needs to submit renewal application to [employer]. Left detailed VM for patient regarding need to complete renewal application and she can stop by clinic to sign but will need to bring income documents.  Placed patient portion in "PAP pending info" folder in pharmacy office. Will have to ATC patient again tomorrow or Friday  Requested return call with direct phone number to pharmacy office. Will place provider portion of renewal application in Dr. Donald Prose mailbox to be signed  Knox Saliva, PharmD, MPH, BCPS Clinical Pharmacist (Rheumatology and Pulmonology)

## 2021-12-20 ENCOUNTER — Other Ambulatory Visit (HOSPITAL_COMMUNITY): Payer: Self-pay

## 2021-12-20 NOTE — Telephone Encounter (Addendum)
Patient returned call. She states she received a letter stating her [employer] was denied due to patient assistance.  Called [employer] - they state she wrote that she had private insurance. I advised this was mistake on patient's part. They were able to re-review her application.  Received a verbal notification from  [employer] regarding an approval for [place] patient assistance from 12/20/21 to 12/01/22.  Awaiting fax of approval letter. Notified patient that [employer] will be reaching out to schedule shipment  Knox Saliva, PharmD, MPH, BCPS Clinical Pharmacist (Rheumatology and Pulmonology)

## 2021-12-21 ENCOUNTER — Other Ambulatory Visit: Payer: Self-pay

## 2021-12-21 ENCOUNTER — Ambulatory Visit (INDEPENDENT_AMBULATORY_CARE_PROVIDER_SITE_OTHER): Payer: Medicare Other | Admitting: Cardiology

## 2021-12-21 ENCOUNTER — Encounter: Payer: Self-pay | Admitting: Cardiology

## 2021-12-21 VITALS — BP 90/48 | HR 95 | Ht 67.0 in | Wt 274.8 lb

## 2021-12-21 DIAGNOSIS — I480 Paroxysmal atrial fibrillation: Secondary | ICD-10-CM

## 2021-12-21 NOTE — Patient Instructions (Addendum)
Medication Instructions:  Your physician recommends that you continue on your current medications as directed. Please refer to the Current Medication list given to you today.  *If you need a refill on your cardiac medications before your next appointment, please call your pharmacy*   Lab Work: None ordered   Testing/Procedures: None ordered   Follow-Up: At [facility], you and your health needs are our priority.  As part of our continuing mission to provide you with exceptional heart care, we have created designated Provider Care Teams.  These Care Teams include your primary Cardiologist (physician) and Advanced Practice Providers (APPs -  Physician Assistants and Nurse Practitioners) who all work together to provide you with the care you need, when you need it.  Your next appointment:   To be  determined  The format for your next appointment:   In Person  Provider:   Allegra Lai, MD    Thank you for choosing [facility]!!   Trinidad Curet, RN [phone]   Other Instructions  Tikosyn (Dofetilide) Hospital Admission  Prior to day of admission: Check with drug insurance company for cost of drug to ensure affordability --- Dofetilide 500 mcg twice a day.  GoodRx is an option if insurance copay is unaffordable.  All patients are tested for COVID-19 prior to admission.  No Benadryl is allowed 3 days prior to admission.  Please ensure no missed doses of your anticoagulation (blood thinner) for 3 weeks prior to admission. If a dose is missed please notify our office immediately.  A pharmacist will review all your medications for potential interactions with Tikosyn. If any medication changes are needed prior to admission we will be in touch with you.  If any new medications are started AFTER your admission date is set with Nurse, adult. Please notify our office immediately so your medication list can be updated and reviewed by our pharmacist again. On day of  admission: Tikosyn initiation requires a 3 night/4 day hospital stay with constant telemetry monitoring. You will have an EKG after each dose of Tikosyn as well as daily lab draws.  If the drug does not convert you to normal rhythm a cardioversion after the 4th dose of Tikosyn.  [facility] office visit on the morning of admission is needed for preliminary labs/ekg.  Time of admission is dependent on bed availability in the hospital. In some instances, you will be sent home until bed is available. Rarely admission can be delayed to the following day if hospital census prevents available beds.  You may bring personal belongings/clothing with you to the hospital. Please leave your suitcase in the car until you arrive in admissions.  Questions please call our office at [phone]       Dofetilide capsules What is this medication? DOFETILIDE (doe FET il ide) is an antiarrhythmic drug. It helps make your heart beat regularly. This medicine also helps to slow rapid heartbeats. This medicine may be used for other purposes; ask your health care provider or pharmacist if you have questions. COMMON BRAND NAME(S): Tikosyn What should I tell my care team before I take this medication? They need to know if you have any of these conditions: heart disease history of irregular heartbeat history of low levels of potassium or magnesium in the blood kidney disease liver disease an unusual or allergic reaction to dofetilide, other medicines, foods, dyes, or preservatives pregnant or trying to get pregnant breast-feeding How should I use this medication? Take this medicine by mouth with a glass  of water. Follow the directions on the prescription label. Do not take with grapefruit juice. You can take it with or without food. If it upsets your stomach, take it with food. Take your medicine at regular intervals. Do not take it more often than directed. Do not stop taking except on your doctor's advice. A  special MedGuide will be given to you by the pharmacist with each prescription and refill. Be sure to read this information carefully each time. Talk to your pediatrician regarding the use of this medicine in children. Special care may be needed. Overdosage: If you think you have taken too much of this medicine contact a poison control center or emergency room at once. NOTE: This medicine is only for you. Do not share this medicine with others. What if I miss a dose? If you miss a dose, skip it. Take your next dose at the normal time. Do not take extra or 2 doses at the same time to make up for the missed dose. What may interact with this medication? Do not take this medicine with any of the following medications: cimetidine cisapride dolutegravir dronedarone erdafitinib hydrochlorothiazide ketoconazole megestrol pimozide prochlorperazine thioridazine trimethoprim verapamil This medicine may also interact with the following medications: amiloride cannabinoids certain antibiotics like erythromycin or clarithromycin certain antiviral medicines for HIV or hepatitis certain medicines for depression, anxiety, or psychotic disorders digoxin diltiazem grapefruit juice metformin nefazodone other medicines that prolong the QT interval (an abnormal heart rhythm) quinine triamterene zafirlukast ziprasidone This list may not describe all possible interactions. Give your health care provider a list of all the medicines, herbs, non-prescription drugs, or dietary supplements you use. Also tell them if you smoke, drink alcohol, or use illegal drugs. Some items may interact with your medicine. What should I watch for while using this medication? Your condition will be monitored carefully while you are receiving this medicine. What side effects may I notice from receiving this medication? Side effects that you should report to your doctor or health care professional as soon as  possible: allergic reactions like skin rash, itching or hives, swelling of the face, lips, or tongue breathing problems chest pain or chest tightness dizziness signs and symptoms of a dangerous change in heartbeat or heart rhythm like chest pain; dizziness; fast or irregular heartbeat; palpitations; feeling faint or lightheaded, falls; breathing problems signs and symptoms of electrolyte imbalance like severe diarrhea, unusual sweating, vomiting, loss of appetite, increased thirst swelling of the ankles, legs, or feet tingling, numbness in the hands or feet Side effects that usually do not require medical attention (report to your doctor or health care professional if they continue or are bothersome): diarrhea general ill feeling or flu-like symptoms headache nausea trouble sleeping stomach pain This list may not describe all possible side effects. Call your doctor for medical advice about side effects. You may report side effects to FDA at 1-800-FDA-1088. Where should I keep my medication? Keep out of the reach of children. Store at room temperature between 15 and 30 degrees C (59 and 86 degrees F). Throw away any unused medicine after the expiration date. NOTE: This sheet is a summary. It may not cover all possible information. If you have questions about this medicine, talk to your doctor, pharmacist, or health care provider.  2022 Elsevier/Gold Standard (2021-08-07 00:00:00)

## 2021-12-21 NOTE — Progress Notes (Signed)
Electrophysiology Office Note   Date:  12/21/2021   ID:  [name], [name] 1941/09/04, MRN [id number]  PCP:  [name], MD  Cardiologist:  [place] Primary Electrophysiologist:  Cadance Raus Meredith Leeds, MD    Chief Complaint: AF   History of Present Illness: [name] [name] is a 81 y.o. female who is being seen today for the evaluation of AF at the request of Croitoru, Mihai, MD. Presenting today for electrophysiology evaluation.  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  She presents for evaluation of atrial fibrillation.  She had an episode of atrial fibrillation which lasted for 4 days in November.  She felt weak and short of breath.  She currently uses oxygen with exertion.  Between episodes of atrial fibrillation she she has baseline shortness of breath and fatigue but feels better.  Today, she denies symptoms of palpitations, chest pain, orthopnea, PND, lower extremity edema, claudication, dizziness, presyncope, syncope, bleeding, or neurologic sequela. The patient is tolerating medications without difficulties.    Past Medical History:  Diagnosis Date   Alpha-1-antitrypsin deficiency carrier    Arthritis    "all over"   Atherosclerosis of aorta (HCC)    Atrial fibrillation ([place])    Back pain    Complication of anesthesia    "I woke up during 2 different procedures" (53/05/6439)   Diastolic dysfunction    Frequent UTI    GERD (gastroesophageal reflux disease)    Hypertension    Hypertriglyceridemia    Insulin resistance    Long term current use of anticoagulant    Multiple lung nodules    Muscular deconditioning    MVP (mitral valve prolapse)    Obesity    OSA on CPAP    Osteoarthritis    Osteopenia     unspecified location   Pacemaker    PAF (paroxysmal atrial fibrillation) (HCC)    Positive ANA (antinuclear antibody)    Presbyesophagus    Presence of permanent cardiac pacemaker    Primary osteoarthritis of both hands    neck and spine and hips   Pulmonary fibrosis ([place])    followed by pulmonolgy   PVC's (premature ventricular contractions)    Shortness of breath    Sinus arrest 10/2014   s/p Medtronic Advisa model A2DR01 serial number [id number]   Sleep apnea    Stroke ([place]) 01/2014   denies deficits on 10/04/2014   Tachy-brady syndrome ([place]) 10/2014   TIA (transient ischemic attack)    Vitamin D deficiency    Past Surgical History:  Procedure Laterality Date   CARDIOVERSION N/A 09/02/2017   Procedure: CARDIOVERSION;  Surgeon: Sanda Klein, MD;  Location: [facility];  Service: Cardiovascular;  Laterality: N/A;   COLONOSCOPY  2019   ESOPHAGEAL DILATION     EXCISION VAGINAL CYST     benign nodules   INSERT / REPLACE / REMOVE PACEMAKER  10/04/2014   Medtronic Advisa model A2DR01 serial number [id number]   LOOP RECORDER EXPLANT N/A 10/04/2014   Procedure: LOOP RECORDER EXPLANT;  Surgeon: Sanda Klein, MD;  Location: [place] CATH LAB;  Service: Cardiovascular;  Laterality: N/A;   LOOP RECORDER IMPLANT N/A 04/19/2014   Procedure: LOOP RECORDER IMPLANT;  Surgeon: Sanda Klein, MD;  Location: [place] CATH LAB;  Service: Cardiovascular;  Laterality: N/A;   NM MYOCAR PERF WALL MOTION  12/18/2007  normal   PERMANENT PACEMAKER INSERTION N/A 10/04/2014   Procedure: PERMANENT PACEMAKER INSERTION;  Surgeon: Sanda Klein, MD;  Location: [place] CATH LAB;  Service: Cardiovascular;  Laterality: N/A;   TUBAL LIGATION     US ECHOCARDIOGRAPHY  05/15/2010   LA mildly dilated,mild mitral annular ca+, AOV mildly sclerotic, mild asymmetric LVH     Current Outpatient Medications  Medication Sig Dispense Refill   Acetaminophen (TYLENOL ARTHRITIS PAIN PO) Take 2 tablets by mouth in the morning and at  bedtime.     apixaban (ELIQUIS) 5 MG TABS tablet Take 1 tablet (5 mg total) by mouth 2 (two) times daily. 180 tablet 3   B Complex-C (SUPER B COMPLEX PO) Take 1 tablet by mouth daily.     Biotin 5000 MCG CAPS Take 5,000 mcg by mouth daily.     Calcium Carbonate-Vit D-Min (CALCIUM 1200 PO) Take 1,200 mg by mouth daily.      cholecalciferol (VITAMIN D) 1000 UNITS tablet Take 6,000 Units by mouth daily.      Co-Enzyme Q10 100 MG CAPS Take 100 mg by mouth daily.      diazepam (VALIUM) 5 MG tablet TAKE 1 TABLET EACH DAY. (Patient taking differently: Take 5 mg by mouth daily as needed for anxiety. TAKE 1 TABLET EACH DAY.) 30 tablet 0   fluticasone (FLONASE) 50 MCG/ACT nasal spray Place 2 sprays into both nostrils as needed for allergies or rhinitis.     folic acid (FOLVITE) 076 MCG tablet Take 400 mcg by mouth daily.     glucosamine-chondroitin 500-400 MG tablet Take 1 tablet by mouth 2 (two) times daily.      loratadine (CLARITIN) 10 MG tablet Take 10 mg by mouth daily.     MAGNESIUM CITRATE PO Take 400 mg by mouth daily. 1 tab daily     metoprolol tartrate (LOPRESSOR) 25 MG tablet Take 1 tablet (25 mg total) by mouth 2 (two) times daily. 180 tablet 3   Multiple Vitamins-Minerals (ZINC PO) Take 1 tablet by mouth daily.     Nintedanib (OFEV) 100 MG CAPS Take 1 capsule (100 mg total) by mouth 2 (two) times daily. 180 capsule 3   nitroGLYCERIN (NITROSTAT) 0.4 MG SL tablet Place 1 tablet (0.4 mg total) under the tongue every 5 (five) minutes as needed for chest pain. 25 tablet 1   omeprazole (PRILOSEC) 40 MG capsule Take 1 capsule by mouth daily.     Resveratrol 250 MG CAPS Take 250 mg by mouth daily.     sertraline (ZOLOFT) 100 MG tablet TAKE 1 TABLET EACH DAY. 90 tablet 0   No current facility-administered medications for this visit.    Allergies:   Cefazolin, Pseudoephedrine hcl, Ergotamine, Other, Pentobarbital, Pirfenidone, and Caffeine   Social History:  The patient  reports that she quit  smoking about 51 years ago. Her smoking use included cigarettes. She has a 36.00 pack-year smoking history. She has never used smokeless tobacco. She reports current alcohol use. She reports that she does not use drugs.   Family History:  The patient's family history includes Alcoholism in her brother; Alpha-1 antitrypsin deficiency in her daughter; Arthritis in her son; Breast cancer in her daughter; Diabetes in her son; Healthy in her brother, daughter, and sister; Heart attack in her mother; Heart failure in her father; Prostate cancer in her brother, brother, and brother; Stroke in her brother, brother, and father; Sudden death in her mother.    ROS:  Please see the history of present illness.  Otherwise, review of systems is positive for none.   All other systems are reviewed and negative.    PHYSICAL EXAM: VS:  BP (!) 90/48    Pulse 95    Ht _0  (1.702 m)    Wt 274 lb 12.8 oz (124.6 kg)    LMP  (LMP Unknown)    SpO2 94%    BMI 43.04 kg/m  , BMI Body mass index is 43.04 kg/m. GEN: Well nourished, well developed, in no acute distress  HEENT: normal  Neck: no JVD, carotid bruits, or masses Cardiac: irregular; no murmurs, rubs, or gallops,no edema  Respiratory:  clear to auscultation bilaterally, normal work of breathing GI: soft, nontender, nondistended, + BS MS: no deformity or atrophy  Skin: warm and dry, device pocket is well healed Neuro:  Strength and sensation are intact Psych: euthymic mood, full affect  EKG:  EKG is not ordered today. Personal review of the ekg ordered 12/15/21 shows atrial fibrillation, rate 85  Device interrogation is reviewed today in detail.  See PaceArt for details.   Recent Labs: 01/04/2021: TSH 3.320 04/17/2021: Pro B Natriuretic peptide (BNP) 289.0 12/15/2021: ALT 23; B Natriuretic Peptide 309.1; BUN 25; Creatinine, Ser 1.21; Hemoglobin 15.6; Platelets 286; Potassium 4.8; Sodium 139    Lipid Panel     Component Value Date/Time   CHOL 195  07/28/2020 0913   CHOL 176 06/03/2018 1022   TRIG 133 07/28/2020 0913   HDL 55 07/28/2020 0913   HDL 53 06/03/2018 1022   CHOLHDL 3.5 07/28/2020 0913   VLDL 33.6 01/15/2017 1417   LDLCALC 115 (H) 07/28/2020 0913   LDLDIRECT 81.0 07/15/2016 1419     Wt Readings from Last 3 Encounters:  12/21/21 274 lb 12.8 oz (124.6 kg)  12/15/21 270 lb (122.5 kg)  11/09/21 277 lb 6.4 oz (125.8 kg)      Other studies Reviewed: Additional studies/ records that were reviewed today include: TTE 05/18/19  Review of the above records today demonstrates:   1. The left ventricle has low normal systolic function, with an ejection  fraction of 50-55%. The cavity size was normal. Left ventricular diastolic  function could not be evaluated.   2. The right ventricle has normal systolic function. The cavity was  normal.   3. The mitral valve is grossly normal.   4. The tricuspid valve is grossly normal.   5. The aortic valve is tricuspid. Mild thickening of the aortic valve. No  stenosis of the aortic valve.   6. Normal LV systolic function; no significant valvular heart disease.    ASSESSMENT AND PLAN:  1.  Paroxysmal atrial fibrillation: Burden is increased to 11% on pacemaker.  CHA2DS2-VASc of at least 3.  Currently on Eliquis 5 mg twice daily.  She is on oxygen almost 100% of the time.  Due to that, she would be a poor candidate for ablation.  Her QTC is acceptable.  We Jayline Kilburg plan for dofetilide load.  2.  Sick sinus syndrome: Status post Medtronic dual-chamber pacemaker.  Device functioning appropriately.  No changes.  3.  Obstructive sleep apnea: CPAP compliance encouraged  4.  IPF: Currently followed by pulmonary.  Case discussed with primary cardiology  Current medicines are reviewed at length with the patient today.   The patient does not have concerns regarding her medicines.  The following changes were made today:  none  Labs/ tests ordered today include:  No orders of the defined types  were placed in this encounter.  Disposition:   FU with Vaness Jelinski 3 months  Signed, Vanisha Whiten Meredith Leeds, MD  12/21/2021 12:24 PM     [place] [address] [place] [address] [phone] (office) [phone] (fax)

## 2021-12-21 NOTE — Progress Notes (Deleted)
Electrophysiology Office Note   Date:  12/21/2021   ID:  [name], [name] [birth year], MRN [id number]  PCP:  [name], MD  Cardiologist:  [place] Primary Electrophysiologist:  Desirae Mancusi Meredith Leeds, MD    No chief complaint on file.    History of Present Illness: [name] [name] is a 81 y.o. female who presents today for electrophysiology evaluation.   ***   Today, she denies*** symptoms of palpitations, chest pain, shortness of breath, orthopnea, PND, lower extremity edema, claudication, dizziness, presyncope, syncope, bleeding, or neurologic sequela. The patient is tolerating medications without difficulties and is otherwise without complaint today.    Past Medical History:  Diagnosis Date   Alpha-1-antitrypsin deficiency carrier    Arthritis    "all over"   Atherosclerosis of aorta (HCC)    Atrial fibrillation ([place])    Back pain    Complication of anesthesia    "I woke up during 2 different procedures" (70/0/1749)   Diastolic dysfunction    Frequent UTI    GERD (gastroesophageal reflux disease)    Hypertension    Hypertriglyceridemia    Insulin resistance    Long term current use of anticoagulant    Multiple lung nodules    Muscular deconditioning    MVP (mitral valve prolapse)    Obesity    OSA on CPAP    Osteoarthritis    Osteopenia    unspecified location   Pacemaker    PAF (paroxysmal atrial fibrillation) (HCC)    Positive ANA (antinuclear antibody)    Presbyesophagus    Presence of permanent cardiac pacemaker    Primary osteoarthritis of both hands    neck and spine and hips   Pulmonary fibrosis ([place])    followed by pulmonolgy   PVC's (premature ventricular contractions)    Shortness of breath    Sinus arrest 10/2014   s/p Medtronic Advisa model A2DR01 serial number [id number]   Sleep apnea    Stroke ([place]) 01/2014   denies deficits on 10/04/2014   Tachy-brady syndrome ([place]) 10/2014   TIA (transient ischemic attack)    Vitamin D deficiency     Past Surgical History:  Procedure Laterality Date   CARDIOVERSION N/A 09/02/2017   Procedure: CARDIOVERSION;  Surgeon: Sanda Klein, MD;  Location: [facility];  Service: Cardiovascular;  Laterality: N/A;   COLONOSCOPY  2019   ESOPHAGEAL DILATION     EXCISION VAGINAL CYST     benign nodules   INSERT / REPLACE / REMOVE PACEMAKER  10/04/2014   Medtronic Advisa model A2DR01 serial number [id number]   LOOP RECORDER EXPLANT N/A 10/04/2014   Procedure: LOOP RECORDER EXPLANT;  Surgeon: Sanda Klein, MD;  Location: [facility] CATH LAB;  Service: Cardiovascular;  Laterality: N/A;   LOOP RECORDER IMPLANT N/A 04/19/2014   Procedure: LOOP RECORDER IMPLANT;  Surgeon: Sanda Klein, MD;  Location: [facility] CATH [facility];  Service: Cardiovascular;  Laterality: N/A;   NM MYOCAR PERF WALL MOTION  12/18/2007   normal   PERMANENT PACEMAKER INSERTION N/A 10/04/2014   Procedure: PERMANENT PACEMAKER INSERTION;  Surgeon: Sanda Klein, MD;  Location: [facility] CATH LAB;  Service: Cardiovascular;  Laterality: N/A;   TUBAL LIGATION     US ECHOCARDIOGRAPHY  05/15/2010   LA mildly dilated,mild mitral annular ca+, AOV mildly sclerotic, mild asymmetric LVH     Current Outpatient Medications  Medication Sig Dispense Refill   Acetaminophen (TYLENOL ARTHRITIS PAIN PO) Take 2 tablets by mouth in the morning and at bedtime.     apixaban (ELIQUIS)  5 MG TABS tablet Take 1 tablet (5 mg total) by mouth 2 (two) times daily. 180 tablet 3   B Complex-C (SUPER B COMPLEX PO) Take 1 tablet by mouth daily.     Biotin 5000 MCG CAPS Take 5,000 mcg by mouth daily.     Calcium Carbonate-Vit D-Min (CALCIUM 1200 PO) Take 1,200 mg by mouth daily.      cholecalciferol (VITAMIN D) 1000 UNITS tablet Take 6,000 Units by mouth daily.      Co-Enzyme Q10 100 MG CAPS Take 100 mg by mouth daily.      diazepam (VALIUM) 5 MG tablet TAKE 1 TABLET EACH DAY. (Patient taking differently: Take 5 mg by mouth daily as needed for anxiety. TAKE 1 TABLET EACH DAY.) 30 tablet  0   fluticasone (FLONASE) 50 MCG/ACT nasal spray Place 2 sprays into both nostrils as needed for allergies or rhinitis.     folic acid (FOLVITE) 893 MCG tablet Take 400 mcg by mouth daily.     glucosamine-chondroitin 500-400 MG tablet Take 1 tablet by mouth 2 (two) times daily.      loratadine (CLARITIN) 10 MG tablet Take 10 mg by mouth daily.     MAGNESIUM CITRATE PO Take 400 mg by mouth daily. 1 tab daily     metoprolol tartrate (LOPRESSOR) 25 MG tablet Take 1 tablet (25 mg total) by mouth 2 (two) times daily. 180 tablet 3   Multiple Vitamins-Minerals (ZINC PO) Take 1 tablet by mouth daily.     Nintedanib (OFEV) 100 MG CAPS Take 1 capsule (100 mg total) by mouth 2 (two) times daily. 180 capsule 3   nitroGLYCERIN (NITROSTAT) 0.4 MG SL tablet Place 1 tablet (0.4 mg total) under the tongue every 5 (five) minutes as needed for chest pain. 25 tablet 1   omeprazole (PRILOSEC) 40 MG capsule Take 1 capsule by mouth daily.     Resveratrol 250 MG CAPS Take 250 mg by mouth daily.     sertraline (ZOLOFT) 100 MG tablet TAKE 1 TABLET EACH DAY. 90 tablet 0   No current facility-administered medications for this visit.    Allergies:   Cefazolin, Pseudoephedrine hcl, Ergotamine, Other, Pentobarbital, Pirfenidone, and Caffeine   Social History:  The patient  reports that she quit smoking about 51 years ago. Her smoking use included cigarettes. She has a 36.00 pack-year smoking history. She has never used smokeless tobacco. She reports current alcohol use. She reports that she does not use drugs.   Family History:  The patient's ***family history includes Alcoholism in her brother; Alpha-1 antitrypsin deficiency in her daughter; Arthritis in her son; Breast cancer in her daughter; Diabetes in her son; Healthy in her brother, daughter, and sister; Heart attack in her mother; Heart failure in her father; Prostate cancer in her brother, brother, and brother; Stroke in her brother, brother, and father; Sudden death  in her mother.    ROS:  Please see the history of present illness.   Otherwise, review of systems is positive for ***.   All other systems are reviewed and negative.    PHYSICAL EXAM: VS:  BP (!) 90/48    Pulse 95    Ht _0  (1.702 m)    Wt 274 lb 12.8 oz (124.6 kg)    LMP  (LMP Unknown)    SpO2 94%    BMI 43.04 kg/m  , BMI Body mass index is 43.04 kg/m. GEN: Well nourished, well developed, in no acute distress HEENT: normal Neck: no JVD,  carotid bruits, or masses Cardiac: ***RRR; no murmurs, rubs, or gallops,no edema  Respiratory:  clear to auscultation bilaterally, normal work of breathing GI: soft, nontender, nondistended, + BS MS: no deformity or atrophy Skin: warm and dry, *** device pocket is well healed Neuro:  Strength and sensation are intact Psych: euthymic mood, full affect  EKG:  EKG {ACTION; IS/IS RCV:[id number]} ordered today. The ekg ordered today shows ***  *** Device interrogation is reviewed today in detail.  See PaceArt for details.   Recent Labs: 01/04/2021: TSH 3.320 04/17/2021: Pro B Natriuretic peptide (BNP) 289.0 12/15/2021: ALT 23; B Natriuretic Peptide 309.1; BUN 25; Creatinine, Ser 1.21; Hemoglobin 15.6; Platelets 286; Potassium 4.8; Sodium 139    Lipid Panel     Component Value Date/Time   CHOL 195 07/28/2020 0913   CHOL 176 06/03/2018 1022   TRIG 133 07/28/2020 0913   HDL 55 07/28/2020 0913   HDL 53 06/03/2018 1022   CHOLHDL 3.5 07/28/2020 0913   VLDL 33.6 01/15/2017 1417   LDLCALC 115 (H) 07/28/2020 0913   LDLDIRECT 81.0 07/15/2016 1419     Wt Readings from Last 3 Encounters:  12/21/21 274 lb 12.8 oz (124.6 kg)  12/15/21 270 lb (122.5 kg)  11/09/21 277 lb 6.4 oz (125.8 kg)      Other studies Reviewed: Additional studies/ records that were reviewed today include: ***  Review of the above records today demonstrates: ***   ASSESSMENT AND PLAN:  1.  ***    Current medicines are reviewed at length with the patient today.   The  patient {ACTIONS; HAS/DOES NOT HAVE:19233} concerns regarding her medicines.  The following changes were made today:  {NONE DEFAULTED:18576}  Labs/ tests ordered today include: *** No orders of the defined types were placed in this encounter.    Disposition:   FU with *** {gen number 5-10:258527} {Days to years:10300}  Signed, Orlo Brickle Meredith Leeds, MD  12/21/2021 12:03 PM     [place] [address] [phone] (office) [phone] (fax)

## 2021-12-24 DIAGNOSIS — R350 Frequency of micturition: Secondary | ICD-10-CM | POA: Diagnosis not present

## 2021-12-27 DIAGNOSIS — I480 Paroxysmal atrial fibrillation: Secondary | ICD-10-CM | POA: Diagnosis not present

## 2021-12-27 DIAGNOSIS — K219 Gastro-esophageal reflux disease without esophagitis: Secondary | ICD-10-CM | POA: Diagnosis not present

## 2021-12-27 DIAGNOSIS — F331 Major depressive disorder, recurrent, moderate: Secondary | ICD-10-CM | POA: Diagnosis not present

## 2021-12-27 DIAGNOSIS — G459 Transient cerebral ischemic attack, unspecified: Secondary | ICD-10-CM | POA: Diagnosis not present

## 2021-12-27 DIAGNOSIS — M1712 Unilateral primary osteoarthritis, left knee: Secondary | ICD-10-CM | POA: Diagnosis not present

## 2021-12-27 DIAGNOSIS — I10 Essential (primary) hypertension: Secondary | ICD-10-CM | POA: Diagnosis not present

## 2022-01-04 DIAGNOSIS — R300 Dysuria: Secondary | ICD-10-CM | POA: Diagnosis not present

## 2022-01-04 DIAGNOSIS — R1030 Lower abdominal pain, unspecified: Secondary | ICD-10-CM | POA: Diagnosis not present

## 2022-01-04 DIAGNOSIS — F411 Generalized anxiety disorder: Secondary | ICD-10-CM | POA: Diagnosis not present

## 2022-01-09 ENCOUNTER — Ambulatory Visit (INDEPENDENT_AMBULATORY_CARE_PROVIDER_SITE_OTHER): Payer: Medicare Other

## 2022-01-09 ENCOUNTER — Telehealth: Payer: Self-pay | Admitting: Pharmacist

## 2022-01-09 DIAGNOSIS — I495 Sick sinus syndrome: Secondary | ICD-10-CM | POA: Diagnosis not present

## 2022-01-09 NOTE — Telephone Encounter (Signed)
Patient notified to stop Resveratrol.

## 2022-01-09 NOTE — Telephone Encounter (Signed)
Medication list reviewed in anticipation of upcoming Tikosyn initiation. Recommend that pt stop taking resveratrol - it can increase concentrations of Tikosyn. Pt takes sertraline which is QTc prolonging but less so than other SSRIs. Should be ok to continue but will need to monitor QTc closely.  Patient is anticoagulated on Eliquis 66m BID on the appropriate dose. Please ensure that patient has not missed any anticoagulation doses in the 3 weeks prior to Tikosyn initiation.   Patient will need to be counseled to avoid use of Benadryl while on Tikosyn and in the 2-3 days prior to Tikosyn initiation.

## 2022-01-10 ENCOUNTER — Ambulatory Visit: Payer: Medicare Other | Admitting: Internal Medicine

## 2022-01-10 ENCOUNTER — Telehealth: Payer: Self-pay | Admitting: Internal Medicine

## 2022-01-10 NOTE — Telephone Encounter (Signed)
Patient is returning phone call. Patient phone number is [phone]. 

## 2022-01-10 NOTE — Telephone Encounter (Signed)
Called and spoke with pt who states that she is in active afib and not feeling well from it. States that she does not feel like she would be able to do the PFT. Stated to pt that we could cancel her appts and for her to call office back once feeling better to get appts rescheduled and she verbalized understanding. Nothing further needed.

## 2022-01-10 NOTE — Telephone Encounter (Signed)
Called patient but she did not answer. Left message for her to call back.  

## 2022-01-11 ENCOUNTER — Encounter: Payer: Self-pay | Admitting: Internal Medicine

## 2022-01-11 LAB — CUP PACEART REMOTE DEVICE CHECK
Battery Remaining Longevity: 26 mo
Battery Voltage: 2.95 V
Brady Statistic RA Percent Paced: 0 %
Brady Statistic RV Percent Paced: 20.41 %
Date Time Interrogation Session: 20230209211830
Implantable Lead Implant Date: 20151103
Implantable Lead Implant Date: 20151103
Implantable Lead Location: 753859
Implantable Lead Location: 753860
Implantable Lead Model: 5076
Implantable Lead Model: 5076
Implantable Pulse Generator Implant Date: 20151103
Lead Channel Impedance Value: 380 Ohm
Lead Channel Impedance Value: 418 Ohm
Lead Channel Impedance Value: 494 Ohm
Lead Channel Impedance Value: 532 Ohm
Lead Channel Pacing Threshold Amplitude: 0.5 V
Lead Channel Pacing Threshold Amplitude: 0.5 V
Lead Channel Pacing Threshold Pulse Width: 0.4 ms
Lead Channel Pacing Threshold Pulse Width: 0.4 ms
Lead Channel Sensing Intrinsic Amplitude: 4.125 mV
Lead Channel Sensing Intrinsic Amplitude: 4.125 mV
Lead Channel Sensing Intrinsic Amplitude: 7.625 mV
Lead Channel Sensing Intrinsic Amplitude: 7.625 mV
Lead Channel Setting Pacing Amplitude: 1.5 V
Lead Channel Setting Pacing Amplitude: 2 V
Lead Channel Setting Pacing Pulse Width: 0.4 ms
Lead Channel Setting Sensing Sensitivity: 2.8 mV

## 2022-01-11 NOTE — Progress Notes (Signed)
Interstitial Lung Disease Multidisciplinary Conference   [name] [name]    MRN [id number]    DOB 06/30/1941  Primary Care Physician:Miller, Lattie Haw, MD  Referring Physician: Dr Chase Caller  Time of Conference: 7.30am- 8.30am Date of conference: 01/11/22 Location of Conference: -  Virtual  Participating Pulmonary: Dr. Brand Males, MD - yes,  Dr Marshell Garfinkel, MD - no Pathology: Dr Jaquita Folds, MD - no , Dr Enid Cutter - no Radiology: Dr Salvatore Marvel MD - no, Dr Vinnie Langton MD - no,  Dr Lorin Picket, MD - no , Dr Eddie Candle - no. 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 Need to discuss in MDD. PFT worse 2018 ->2022.   Serology:   MDD discussion of CT scan    - Date or time period of scan: HRCT: 05/31/2020 HRCT: 04/03/2018 HRCT: 01/06/2017  - Features mentioned:  Case conference 01/11/22 - PRb UIP on CT. Proor reconstruction kernels were different. So Dr Yetta Glassman not sure if there is radiologic worsening since 2020 but definite worsening since 2018 -> 2022.   - What is the final conclusion per 2018 ATS/Fleischner Criteria - So clinically agree with IPF  Pathology discussion of biopsy :  PFTs:  PFT Results Latest Ref Rng & Units 09/26/2021 05/07/2021 08/10/2020 07/19/2019 02/24/2017  FVC-Pre L 2.38 2.53 2.57 2.74 2.58  FVC-Predicted Pre % 79 84 84 93 85  FVC-Post L - - - 2.63 2.45  FVC-Predicted Post % - - - 89 81  Pre FEV1/FVC % % 87 86 86 86 82  Post FEV1/FCV % % - - - 90 87  FEV1-Pre L 2.06 2.17 2.22 2.36 2.12  FEV1-Predicted Pre % 92 97 97 107 93  FEV1-Post L - - - 2.35 2.14  DLCO uncorrected ml/min/mmHg 9.76 15.50 15.83 16.52 16.15  DLCO UNC% % 47 74 76 81 59  DLCO corrected ml/min/mmHg 9.48 15.50 15.34 - 14.93  DLCO COR %Predicted % 45 74 73 - 55  DLVA Predicted % 77 105 97 104 79  TLC L - - 4.50 3.97 -  TLC % Predicted % - - 81 74 -  RV % Predicted % - - 71 52 -     Labs: x  MDD Impression/Recs:  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 No need for biopsy   Time Spent in preparation and discussion:  > 30 min    SIGNATURE   Dr. Brand Males, M.D., F.C.C.P,  Pulmonary and Critical Care Medicine Staff Physician, [place] Director - Interstitial Lung Disease  Program  Pulmonary [place] [facility] [place], [place], [address]  Pager: [phone], If no answer or between  15:00h - 7:00h: call [phone] Telephone: [phone]  9:46 AM 01/11/2022 ...................................................................................................................Marland Kitchen References: Diagnosis of Hypersensitivity Pneumonitis in Adults. An Official ATS/JRS/ALAT Clinical Practice Guideline. Ragu G et al, [place] Aug 1;202(3):e36-e69.       Diagnosis of Idiopathic Pulmonary Fibrosis. An Official ATS/ERS/JRS/ALAT Clinical Practice Guideline. Raghu G et al, [place]. 2018 Sep 1;198(5):e44-e68.   IPF Suspected   Histopath ology Pattern      UIP  Probable UIP  Indeterminate for  UIP  Alternative  diagnosis    UIP  IPF  IPF  IPF  Non-IPF dx   HRCT   Probabe UIP  IPF  IPF  IPF (Likely)**  Non-IPF dx  Pattern  Indeterminate for UIP  IPF  IPF (Likely)**  Indeterminate  for IPF**  Non-IPF dx    Alternative diagnosis  IPF (Likely)**/ non-IPF dx  Non-IPF dx  Non-IPF dx  Non-IPF dx     Idiopathic pulmonary fibrosis diagnosis based upon HRCT and Biopsy paterns.  ** IPF is the likely diagnosis when any of following features are present:  Moderate-to-severe traction bronchiectasis/bronchiolectasis (defined as mild traction bronchiectasis/bronchiolectasis in four or more lobes including the lingual as a lobe, or moderate to severe traction bronchiectasis in two or more lobes) in a man over age 34 years or in a woman over age 64 years Extensive (>30%) reticulation on HRCT and an age >70  years  Increased neutrophils and/or absence of lymphocytosis in BAL fluid  Multidisciplinary discussion reaches a confident diagnosis of IPF.   **Indeterminate for IPF  Without an adequate biopsy is unlikely to be IPF  With an adequate biopsy may be reclassified to a more specific diagnosis after multidisciplinary discussion and/or additional consultation.   dx = diagnosis; HRCT = high-resolution computed tomography; IPF = idiopathic pulmonary fibrosis; UIP = usual interstitial pneumonia.

## 2022-01-14 NOTE — Progress Notes (Signed)
Remote pacemaker transmission.

## 2022-01-21 DIAGNOSIS — R300 Dysuria: Secondary | ICD-10-CM | POA: Diagnosis not present

## 2022-01-21 DIAGNOSIS — N3941 Urge incontinence: Secondary | ICD-10-CM | POA: Diagnosis not present

## 2022-01-23 DIAGNOSIS — R300 Dysuria: Secondary | ICD-10-CM | POA: Diagnosis not present

## 2022-02-05 ENCOUNTER — Ambulatory Visit (HOSPITAL_COMMUNITY): Payer: Medicare Other | Admitting: Nurse Practitioner

## 2022-02-11 ENCOUNTER — Other Ambulatory Visit: Payer: Self-pay

## 2022-02-11 MED ORDER — APIXABAN 5 MG PO TABS: 5.0000 mg | ORAL_TABLET | Freq: Two times a day (BID) | ORAL | 1 refills | Status: DC

## 2022-02-11 NOTE — Telephone Encounter (Signed)
Prescription refill request for Eliquis received. ?Indication:Afib ?Last office visit:1/23 ?Scr:1.2 ?Age: 81 ?Weight:124.6 kg ? ?Prescription refilled ? ?

## 2022-02-12 ENCOUNTER — Encounter: Payer: Self-pay | Admitting: Cardiology

## 2022-02-14 DIAGNOSIS — Z87440 Personal history of urinary (tract) infections: Secondary | ICD-10-CM | POA: Diagnosis not present

## 2022-02-14 DIAGNOSIS — N390 Urinary tract infection, site not specified: Secondary | ICD-10-CM | POA: Diagnosis not present

## 2022-02-19 ENCOUNTER — Ambulatory Visit (HOSPITAL_COMMUNITY): Payer: Medicare Other | Admitting: Nurse Practitioner

## 2022-02-25 ENCOUNTER — Other Ambulatory Visit: Payer: Self-pay | Admitting: Gastroenterology

## 2022-02-25 ENCOUNTER — Ambulatory Visit (HOSPITAL_COMMUNITY): Payer: Medicare Other | Admitting: Physician Assistant

## 2022-02-25 DIAGNOSIS — R1310 Dysphagia, unspecified: Secondary | ICD-10-CM

## 2022-02-25 DIAGNOSIS — K625 Hemorrhage of anus and rectum: Secondary | ICD-10-CM

## 2022-02-25 DIAGNOSIS — R1319 Other dysphagia: Secondary | ICD-10-CM | POA: Diagnosis not present

## 2022-02-25 DIAGNOSIS — K5790 Diverticulosis of intestine, part unspecified, without perforation or abscess without bleeding: Secondary | ICD-10-CM | POA: Diagnosis not present

## 2022-02-27 ENCOUNTER — Ambulatory Visit
Admission: RE | Admit: 2022-02-27 | Discharge: 2022-02-27 | Disposition: A | Discharge status: Home / Self Care | Payer: Medicare Other | Source: Ambulatory Visit | Attending: Gastroenterology | Admitting: Gastroenterology

## 2022-02-27 ENCOUNTER — Other Ambulatory Visit: Payer: Self-pay | Admitting: Gastroenterology

## 2022-02-27 ENCOUNTER — Other Ambulatory Visit: Payer: Medicare Other

## 2022-02-27 DIAGNOSIS — Z1231 Encounter for screening mammogram for malignant neoplasm of breast: Secondary | ICD-10-CM | POA: Diagnosis not present

## 2022-02-27 DIAGNOSIS — K222 Esophageal obstruction: Secondary | ICD-10-CM | POA: Diagnosis not present

## 2022-02-27 DIAGNOSIS — R221 Localized swelling, mass and lump, neck: Secondary | ICD-10-CM | POA: Diagnosis not present

## 2022-02-27 DIAGNOSIS — R1310 Dysphagia, unspecified: Secondary | ICD-10-CM

## 2022-02-28 ENCOUNTER — Telehealth: Payer: Self-pay | Admitting: *Deleted

## 2022-02-28 NOTE — Telephone Encounter (Signed)
? ?  Pre-operative Risk Assessment  ?  ?Patient Name: [name] [name]  ?DOB: Nov 18, 1941 ?MRN: [id number]  ? ?  ? ?Request for Surgical Clearance   ? ?Procedure:   ENDOSCOPY W/BOTOX DILATION ? ?Date of Surgery:  Clearance TBD                              ?   ?Surgeon:  DR. KARKI ?Surgeon's Group or Practice Name:  [facility] ?Phone number:  [phone] ?Fax number:  [phone] ?  ?Type of Clearance Requested:   ?- Medical  ?- Pharmacy:  Hold Apixaban (Eliquis)   ?  ?Type of Anesthesia:   PROPOFOL ?  ?Additional requests/questions:   ? ?Signed, ?Julaine Hua   ?02/28/2022, 12:58 PM  ? ?

## 2022-02-28 NOTE — Telephone Encounter (Signed)
Preoperative team, please contact this patient and set up a phone call appointment for further cardiac evaluation.  Thank you for your help. ? ?Jossie Ng. Katelynn Heidler NP-C ? ?  ?02/28/2022, 3:51 PM ?[place] ?[address] ?Office [phone] Fax [phone] ? ?

## 2022-02-28 NOTE — Telephone Encounter (Signed)
Left message for the pt to call back and ask to s/w the preop team for tele pre op appt.  ?

## 2022-02-28 NOTE — Telephone Encounter (Signed)
Patient with diagnosis of afib on Eliquis for anticoagulation.   ? ?Procedure: endoscopy w/ botox dilation ?Date of procedure: TBD ? ?CHA2DS2-VASc Score = 8  ?This indicates a 10.8% annual risk of stroke. ?The patient's score is based upon: ?CHF History: 1 ?HTN History: 1 ?Diabetes History: 0 ?Stroke History: 2 ?Vascular Disease History: 1 ?Age Score: 2 ?Gender Score: 1 ?  ?CrCl 51m/min using adjusted body weight due to obesity ?Platelet count 286K ? ?Per office protocol, patient can hold Eliquis for 1 day prior to procedure. She should resume as soon as safely possible after given elevated CV risk. Of note, pt sees afib clinic on 4/4 and is pending Tikosyn initiation, date not set yet. Will not be able to interrupt anticoag for 3 weeks before and 4 weeks after procedure. Endoscopy should be scheduled around this. ?

## 2022-03-01 NOTE — Telephone Encounter (Signed)
I s/w the pt about setting up a tele pre op appt for her upcoming procedure with Dr. Therisa Doyne. Pt states she is going into the hospital next week for Tikosyn load, (this is a 3 day process). Pt states she is not comfortable having the procedure with Dr. Therisa Doyne until she gets her heart matters taken care. I did tell the pt that I completely understand. I did advise the pt that she should call Dr. Therisa Doyne office and inform them that she would like to post pone the GI procedure for a bit. I assured the pt that I will also send my notes to Dr. Therisa Doyne office as well. I will update the pre op provider that pt is not going to proceed with the GI procedure at this time.  ?

## 2022-03-05 ENCOUNTER — Ambulatory Visit (HOSPITAL_COMMUNITY)
Admission: RE | Admit: 2022-03-05 | Discharge: 2022-03-05 | Disposition: A | Discharge status: Home / Self Care | Payer: Medicare Other | Source: Ambulatory Visit | Attending: Nurse Practitioner | Admitting: Nurse Practitioner

## 2022-03-05 ENCOUNTER — Other Ambulatory Visit: Payer: Self-pay

## 2022-03-05 ENCOUNTER — Encounter (HOSPITAL_COMMUNITY): Payer: Self-pay | Admitting: Physician Assistant

## 2022-03-05 ENCOUNTER — Encounter (HOSPITAL_COMMUNITY): Payer: Self-pay | Admitting: Cardiology

## 2022-03-05 ENCOUNTER — Inpatient Hospital Stay (HOSPITAL_COMMUNITY)
Admission: AD | Admit: 2022-03-05 | Discharge: 2022-03-08 | DRG: 309 | Disposition: A | Discharge status: Home / Self Care | Payer: Medicare Other | Source: Ambulatory Visit | Attending: Cardiology | Admitting: Cardiology

## 2022-03-05 VITALS — BP 106/74 | HR 67 | Ht 67.0 in | Wt 276.2 lb

## 2022-03-05 DIAGNOSIS — E781 Pure hyperglyceridemia: Secondary | ICD-10-CM | POA: Diagnosis present

## 2022-03-05 DIAGNOSIS — Z811 Family history of alcohol abuse and dependence: Secondary | ICD-10-CM

## 2022-03-05 DIAGNOSIS — Z6841 Body Mass Index (BMI) 40.0 and over, adult: Secondary | ICD-10-CM | POA: Diagnosis not present

## 2022-03-05 DIAGNOSIS — M479 Spondylosis, unspecified: Secondary | ICD-10-CM | POA: Diagnosis present

## 2022-03-05 DIAGNOSIS — Z8249 Family history of ischemic heart disease and other diseases of the circulatory system: Secondary | ICD-10-CM

## 2022-03-05 DIAGNOSIS — M19041 Primary osteoarthritis, right hand: Secondary | ICD-10-CM | POA: Diagnosis present

## 2022-03-05 DIAGNOSIS — N182 Chronic kidney disease, stage 2 (mild): Secondary | ICD-10-CM | POA: Diagnosis present

## 2022-03-05 DIAGNOSIS — M160 Bilateral primary osteoarthritis of hip: Secondary | ICD-10-CM | POA: Diagnosis present

## 2022-03-05 DIAGNOSIS — K219 Gastro-esophageal reflux disease without esophagitis: Secondary | ICD-10-CM | POA: Diagnosis present

## 2022-03-05 DIAGNOSIS — G4733 Obstructive sleep apnea (adult) (pediatric): Secondary | ICD-10-CM | POA: Diagnosis present

## 2022-03-05 DIAGNOSIS — I700 Atherosclerosis of aorta: Secondary | ICD-10-CM | POA: Diagnosis present

## 2022-03-05 DIAGNOSIS — M19042 Primary osteoarthritis, left hand: Secondary | ICD-10-CM | POA: Diagnosis present

## 2022-03-05 DIAGNOSIS — Z803 Family history of malignant neoplasm of breast: Secondary | ICD-10-CM | POA: Diagnosis not present

## 2022-03-05 DIAGNOSIS — Z6372 Alcoholism and drug addiction in family: Secondary | ICD-10-CM | POA: Diagnosis not present

## 2022-03-05 DIAGNOSIS — Z823 Family history of stroke: Secondary | ICD-10-CM

## 2022-03-05 DIAGNOSIS — I4819 Other persistent atrial fibrillation: Principal | ICD-10-CM | POA: Diagnosis present

## 2022-03-05 DIAGNOSIS — I130 Hypertensive heart and chronic kidney disease with heart failure and stage 1 through stage 4 chronic kidney disease, or unspecified chronic kidney disease: Secondary | ICD-10-CM | POA: Diagnosis present

## 2022-03-05 DIAGNOSIS — I480 Paroxysmal atrial fibrillation: Secondary | ICD-10-CM | POA: Diagnosis not present

## 2022-03-05 DIAGNOSIS — I341 Nonrheumatic mitral (valve) prolapse: Secondary | ICD-10-CM | POA: Diagnosis present

## 2022-03-05 DIAGNOSIS — Z833 Family history of diabetes mellitus: Secondary | ICD-10-CM | POA: Diagnosis not present

## 2022-03-05 DIAGNOSIS — J84112 Idiopathic pulmonary fibrosis: Secondary | ICD-10-CM | POA: Diagnosis present

## 2022-03-05 DIAGNOSIS — D6869 Other thrombophilia: Secondary | ICD-10-CM | POA: Diagnosis not present

## 2022-03-05 DIAGNOSIS — Z8673 Personal history of transient ischemic attack (TIA), and cerebral infarction without residual deficits: Secondary | ICD-10-CM

## 2022-03-05 DIAGNOSIS — I495 Sick sinus syndrome: Secondary | ICD-10-CM | POA: Diagnosis present

## 2022-03-05 DIAGNOSIS — Z881 Allergy status to other antibiotic agents status: Secondary | ICD-10-CM

## 2022-03-05 DIAGNOSIS — Z888 Allergy status to other drugs, medicaments and biological substances status: Secondary | ICD-10-CM

## 2022-03-05 DIAGNOSIS — M8580 Other specified disorders of bone density and structure, unspecified site: Secondary | ICD-10-CM | POA: Diagnosis present

## 2022-03-05 DIAGNOSIS — Z950 Presence of cardiac pacemaker: Secondary | ICD-10-CM

## 2022-03-05 DIAGNOSIS — Z8042 Family history of malignant neoplasm of prostate: Secondary | ICD-10-CM | POA: Diagnosis not present

## 2022-03-05 DIAGNOSIS — Z87891 Personal history of nicotine dependence: Secondary | ICD-10-CM

## 2022-03-05 DIAGNOSIS — I5032 Chronic diastolic (congestive) heart failure: Secondary | ICD-10-CM | POA: Diagnosis present

## 2022-03-05 DIAGNOSIS — Z7901 Long term (current) use of anticoagulants: Secondary | ICD-10-CM

## 2022-03-05 LAB — BASIC METABOLIC PANEL
Anion gap: 8 (ref 5–15)
BUN: 18 mg/dL (ref 8–23)
CO2: 22 mmol/L (ref 22–32)
Calcium: 8.8 mg/dL — ABNORMAL LOW (ref 8.9–10.3)
Chloride: 108 mmol/L (ref 98–111)
Creatinine, Ser: 1.12 mg/dL — ABNORMAL HIGH (ref 0.44–1.00)
GFR, Estimated: 49 mL/min — ABNORMAL LOW (ref >60)
Glucose, Bld: 153 mg/dL — ABNORMAL HIGH (ref 70–99)
Potassium: 4.3 mmol/L (ref 3.5–5.1)
Sodium: 138 mmol/L (ref 135–145)

## 2022-03-05 LAB — MAGNESIUM: Magnesium: 1.9 mg/dL (ref 1.7–2.4)

## 2022-03-05 MED ORDER — SERTRALINE HCL 100 MG PO TABS
100.0000 mg | ORAL_TABLET | Freq: Every day | ORAL | Status: DC
  Administered 2022-03-06 – 2022-03-08 (×3): 100 mg via ORAL
  Filled 2022-03-05 (×3): qty 1

## 2022-03-05 MED ORDER — MAGNESIUM SULFATE 2 GM/50ML IV SOLN
2.0000 g | Freq: Once | INTRAVENOUS | Status: AC
  Administered 2022-03-05: 2 g via INTRAVENOUS
  Filled 2022-03-05: qty 50

## 2022-03-05 MED ORDER — SODIUM CHLORIDE 0.9% FLUSH
3.0000 mL | Freq: Two times a day (BID) | INTRAVENOUS | Status: DC
  Administered 2022-03-05 – 2022-03-07 (×5): 3 mL via INTRAVENOUS

## 2022-03-05 MED ORDER — FOLIC ACID 1 MG PO TABS
500.0000 ug | ORAL_TABLET | Freq: Every day | ORAL | Status: DC
  Administered 2022-03-06 – 2022-03-08 (×3): 0.5 mg via ORAL
  Filled 2022-03-05 (×3): qty 1

## 2022-03-05 MED ORDER — NINTEDANIB ESYLATE 100 MG PO CAPS
100.0000 mg | ORAL_CAPSULE | Freq: Two times a day (BID) | ORAL | Status: DC
  Administered 2022-03-05 – 2022-03-08 (×6): 100 mg via ORAL
  Filled 2022-03-05 (×6): qty 1

## 2022-03-05 MED ORDER — PANTOPRAZOLE SODIUM 40 MG PO TBEC
80.0000 mg | DELAYED_RELEASE_TABLET | Freq: Every day | ORAL | Status: DC
  Administered 2022-03-06 – 2022-03-08 (×3): 80 mg via ORAL
  Filled 2022-03-05 (×3): qty 2

## 2022-03-05 MED ORDER — NITROGLYCERIN 0.4 MG SL SUBL: 0.4000 mg | SUBLINGUAL_TABLET | SUBLINGUAL | Status: DC | PRN

## 2022-03-05 MED ORDER — SODIUM CHLORIDE 0.9 % IV SOLN: 250.0000 mL | INTRAVENOUS | Status: DC | PRN

## 2022-03-05 MED ORDER — CO-ENZYME Q10 100 MG PO CAPS: 100.0000 mg | ORAL_CAPSULE | Freq: Every day | ORAL | Status: DC

## 2022-03-05 MED ORDER — SODIUM CHLORIDE 0.9% FLUSH: 3.0000 mL | INTRAVENOUS | Status: DC | PRN

## 2022-03-05 MED ORDER — GLUCOSAMINE-CHONDROITIN 500-400 MG PO TABS: 1.0000 | ORAL_TABLET | Freq: Two times a day (BID) | ORAL | Status: DC

## 2022-03-05 MED ORDER — METOPROLOL TARTRATE 25 MG PO TABS
25.0000 mg | ORAL_TABLET | Freq: Two times a day (BID) | ORAL | Status: DC
  Administered 2022-03-05 – 2022-03-08 (×6): 25 mg via ORAL
  Filled 2022-03-05 (×6): qty 1

## 2022-03-05 MED ORDER — DOFETILIDE 500 MCG PO CAPS
500.0000 ug | ORAL_CAPSULE | Freq: Two times a day (BID) | ORAL | Status: DC
  Administered 2022-03-05 – 2022-03-08 (×6): 500 ug via ORAL
  Filled 2022-03-05 (×6): qty 1

## 2022-03-05 MED ORDER — LORATADINE 10 MG PO TABS
10.0000 mg | ORAL_TABLET | Freq: Every day | ORAL | Status: DC
  Administered 2022-03-06 – 2022-03-08 (×3): 10 mg via ORAL
  Filled 2022-03-05 (×3): qty 1

## 2022-03-05 MED ORDER — APIXABAN 5 MG PO TABS
5.0000 mg | ORAL_TABLET | Freq: Two times a day (BID) | ORAL | Status: DC
  Administered 2022-03-05 – 2022-03-08 (×6): 5 mg via ORAL
  Filled 2022-03-05 (×6): qty 1

## 2022-03-05 NOTE — Care Management (Signed)
1635 03-05-22 Patient presented for Tikosyn Load. Benefits check submitted for Tikosyn cost. Case Manager will discuss cost and pharmacy of choice as the patient progresses.  ?

## 2022-03-05 NOTE — Progress Notes (Signed)
Pharmacy: Dofetilide (Tikosyn) - Initial Consult ?Assessment and Electrolyte Replacement ? ?Pharmacy consulted to assist in monitoring and replacing electrolytes in this 81 y.o. female admitted on 03/05/2022 undergoing dofetilide initiation. First dofetilide dose: 03/05/2022 _0 .  ? ?Assessment: ? ?Patient Exclusion Criteria: If any screening criteria checked as "Yes", then  patient  should NOT receive dofetilide until criteria item is corrected.  ?If ?Yes? please indicate correction plan. ? ?YES  NO Patient  Exclusion Criteria Correction Plan  ? ?_1   ?_2   ?Baseline QTc interval is greater than or equal to 440 msec. ?IF above YES box checked dofetilide contraindicated unless patient has ICD; then may proceed if QTc 500-550 msec or with known ventricular conduction abnormalities may proceed with QTc 550-600 msec. ?QTc =  440 EP aware, repleting lytes.   ? ?_3   ?_4   ?Patient is known or suspected to have a digoxin level greater than 2 ng/ml: ?No results found for: DIGOXIN ?   ? ?_5   ?_6   ?Creatinine clearance less than 20 ml/min (calculated using Cockcroft-Gault, actual body weight and serum creatinine): ?Estimated Creatinine Clearance: 54.2 mL/min (A) (by C-G formula based on SCr of 1.12 mg/dL (H)). ? Using TBW, creatinine clearance is 78 ml/min. Using AdjBW, CrCl is 54 ml/min. Study used TBW up to 134 kg. Discussed w/ EP, team will use 513mg dosing.   ? ?_7   ?_8  Patient has received drugs known to prolong the QT intervals within the last 48 hours (phenothiazines, tricyclics or tetracyclic antidepressants, erythromycin, H-1 antihistamines, cisapride, fluoroquinolones, azithromycin, ondansetron).  ? ?Updated information on QT prolonging agents is available to be searched on the following database:QT prolonging agents  Patient on sertraline (low risk compared to other antidepressants), EP aware  ? ?_9   ?_10   ?Patient received a dose of hydrochlorothiazide (Oretic) alone or in any combination including triamterene  (Dyazide, Maxzide) in the last 48 hours.   ? ?_11   ?_12  Patient received a medication known to increase dofetilide plasma concentrations prior to initial dofetilide dose:  ?Trimethoprim (Primsol, Proloprim) in the last 36 hours: Reports being on a "sulfa antibiotic" for UTI in the past 10-14 days, but no doses in past several days ?Verapamil (Calan, Verelan) in the last 36 hours or a sustained release dose in the last 72 hours ?Megestrol (Megace) in the last 5 days  ?Cimetidine (Tagamet) in the last 6 hours ?Ketoconazole (Nizoral) in the last 24 hours ?Itraconazole (Sporanox) in the last 48 hours  ?Prochlorperazine (Compazine) in the last 36 hours ?   ? ?_13   ?_14   ?Patient is known to have a history of torsades de pointes; congenital or acquired long QT syndromes.   ? ?_15   ?_16   ?Patient has received a Class 1 antiarrhythmic with less than 2 half-lives since last dose. ?(Disopyramide, Quinidine, Procainamide, Lidocaine, Mexiletine, Flecainide, Propafenone)   ? ?_17   ?_18   ?Patient has received amiodarone therapy in the past 3 months or amiodarone level is greater than 0.3 ng/ml.   ? ?Patient has been appropriately anticoagulated with apixaban, reports no missed doses in previous 3 weeks.  ? ?Labs: ?   ?Component Value Date/Time  ? K 4.3 03/05/2022 1145  ? MG 1.9 03/05/2022 1145  ?  ? ?Plan: ?Potassium: ?K >/= 4: Appropriate to initiate Tikosyn, no replacement needed   ? ?Magnesium: ?Mg 1.8-2: Give Mg 2 gm IV x1 to prevent Mg from dropping below 1.8 - do not need to recheck Mg. Appropriate to initiate Tikosyn ? ?Thank you for allowing pharmacy to participate in this  patient's care  ? ?Donald Pore ?03/05/2022  3:41 PM  ?

## 2022-03-05 NOTE — Progress Notes (Signed)
? ? ?Primary Care Physician: [name], MD ?Primary Cardiologist: Dr Sallyanne Kuster ?Primary Electrophysiologist: Dr Curt Bears ?Referring Physician: Dr Curt Bears  ? ? ?[name] [name] is a 81 y.o. female with a history of tachybradycardia syndrome s/p PPM, diastolic CHF, MVP, OSA, idiopathic pulmonary fibrosis, CVA, aortic atherosclerosis, atrial fibrillation who presents for follow up in the [facility]. Patient is on Eliquis for a CHADS2VASC score of 8. Her afib burden was noted to be increasing on her device and she was referred to EP for evaluation. She was not felt to be a good ablation candidate and dofetilide was recommended.  ? ?On follow up today, patient presents for dofetilide admission. She denies any missed doses of anticoagulation in the past 3 weeks. She stopped resveratrol. She is actually A pacing today.  ? ?Today, she denies symptoms of palpitations, chest pain,  orthopnea, PND, lower extremity edema, dizziness, presyncope, syncope, bleeding, or neurologic sequela. The patient is tolerating medications without difficulties and is otherwise without complaint today.  ? ? ?Atrial Fibrillation Risk Factors: ? ?she does have symptoms or diagnosis of sleep apnea. ?she is compliant with CPAP therapy. ?she does not have a history of rheumatic fever. ? ? ?she has a BMI of Body mass index is 43.26 kg/m?Marland KitchenMarland Kitchen ?Filed Weights  ? 03/05/22 1138  ?Weight: 125.3 kg  ? ? ?Family History  ?Problem Relation Age of Onset  ? Heart attack Mother   ? Sudden death Mother   ? Stroke Father   ? Heart failure Father   ? Alcoholism Brother   ? Healthy Sister   ? Stroke Brother   ? Alpha-1 antitrypsin deficiency Daughter   ? Breast cancer Daughter   ? Prostate cancer Brother   ? Stroke Brother   ? Prostate cancer Brother   ? Healthy Brother   ? Prostate cancer Brother   ? Healthy Daughter   ? Diabetes Son   ? Arthritis Son   ? ? ? ?Atrial Fibrillation Management history: ? ?Previous antiarrhythmic drugs:  none ?Previous cardioversions: 2018 ?Previous ablations: none ?CHADS2VASC score: 8 ?Anticoagulation history: Eliquis ? ? ?Past Medical History:  ?Diagnosis Date  ? Alpha-1-antitrypsin deficiency carrier   ? Arthritis   ? "all over"  ? Atherosclerosis of aorta ([place])   ? Atrial fibrillation ([place])   ? Back pain   ? Complication of anesthesia   ? "I woke up during 2 different procedures" (10/04/2014)  ? Diastolic dysfunction   ? Frequent UTI   ? GERD (gastroesophageal reflux disease)   ? Hypertension   ? Hypertriglyceridemia   ? Insulin resistance   ? Long term current use of anticoagulant   ? Multiple lung nodules   ? Muscular deconditioning   ? MVP (mitral valve prolapse)   ? Obesity   ? OSA on CPAP   ? Osteoarthritis   ? Osteopenia   ? unspecified location  ? Pacemaker   ? PAF (paroxysmal atrial fibrillation) ([place])   ? Positive ANA (antinuclear antibody)   ? Presbyesophagus   ? Presence of permanent cardiac pacemaker   ? Primary osteoarthritis of both hands   ? neck and spine and hips  ? Pulmonary fibrosis ([place])   ? followed by pulmonolgy  ? PVC's (premature ventricular contractions)   ? Shortness of breath   ? Sinus arrest 10/2014  ? s/p Medtronic Advisa model [id number] serial number [id number]  ? Sleep apnea   ? Stroke [facility]) 01/2014  ? denies deficits on 10/04/2014  ? Tachy-brady syndrome (  [place]) 10/2014  ? TIA (transient ischemic attack)   ? Vitamin D deficiency   ? ?Past Surgical History:  ?Procedure Laterality Date  ? CARDIOVERSION N/A 09/02/2017  ? Procedure: CARDIOVERSION;  Surgeon: Sanda Klein, MD;  Location: [place];  Service: Cardiovascular;  Laterality: N/A;  ? COLONOSCOPY  2019  ? ESOPHAGEAL DILATION    ? EXCISION VAGINAL CYST    ? benign nodules  ? INSERT / REPLACE / REMOVE PACEMAKER  10/04/2014  ? Medtronic Advisa model [id number] serial number [id number]  ? LOOP RECORDER EXPLANT N/A 10/04/2014  ? Procedure: LOOP RECORDER EXPLANT;  Surgeon: Sanda Klein, MD;  Location: [place] CATH LAB;  Service: Cardiovascular;   Laterality: N/A;  ? LOOP RECORDER IMPLANT N/A 04/19/2014  ? Procedure: LOOP RECORDER IMPLANT;  Surgeon: Sanda Klein, MD;  Location: [place] CATH LAB;  Service: Cardiovascular;  Laterality: N/A;  ? NM MYOCAR PERF WALL MOTION  12/18/2007  ? normal  ? PERMANENT PACEMAKER INSERTION N/A 10/04/2014  ? Procedure: PERMANENT PACEMAKER INSERTION;  Surgeon: Sanda Klein, MD;  Location: [facility] CATH LAB;  Service: Cardiovascular;  Laterality: N/A;  ? TUBAL LIGATION    ? US ECHOCARDIOGRAPHY  05/15/2010  ? LA mildly dilated,mild mitral annular ca+, AOV mildly sclerotic, mild asymmetric LVH  ? ? ?Current Outpatient Medications  ?Medication Sig Dispense Refill  ? Acetaminophen (TYLENOL ARTHRITIS PAIN PO) Take 2 tablets by mouth in the morning and at bedtime.    ? apixaban (ELIQUIS) 5 MG TABS tablet Take 1 tablet (5 mg total) by mouth 2 (two) times daily. 180 tablet 1  ? B Complex-C (SUPER B COMPLEX PO) Take 1 tablet by mouth daily.    ? Biotin 5000 MCG CAPS Take 5,000 mcg by mouth daily.    ? Calcium Carbonate-Vit D-Min (CALCIUM 1200 PO) Take 1,200 mg by mouth daily.     ? cholecalciferol (VITAMIN D) 1000 UNITS tablet Take 6,000 Units by mouth daily.     ? Co-Enzyme Q10 100 MG CAPS Take 100 mg by mouth daily.     ? diazepam (VALIUM) 5 MG tablet TAKE 1 TABLET EACH DAY. 30 tablet 0  ? fluticasone (FLONASE) 50 MCG/ACT nasal spray Place 2 sprays into both nostrils as needed for allergies or rhinitis.    ? folic acid (FOLVITE) 237 MCG tablet Take 400 mcg by mouth daily.    ? glucosamine-chondroitin 500-400 MG tablet Take 1 tablet by mouth 2 (two) times daily.     ? loratadine (CLARITIN) 10 MG tablet Take 10 mg by mouth daily.    ? MAGNESIUM CITRATE PO Take 400 mg by mouth daily. 1 tab daily    ? metoprolol tartrate (LOPRESSOR) 25 MG tablet Take 1 tablet (25 mg total) by mouth 2 (two) times daily. 180 tablet 3  ? Multiple Vitamins-Minerals (ZINC PO) Take 1 tablet by mouth daily.    ? Nintedanib (OFEV) 100 MG CAPS Take 1 capsule (100 mg total) by  mouth 2 (two) times daily. 180 capsule 3  ? nitroGLYCERIN (NITROSTAT) 0.4 MG SL tablet Place 1 tablet (0.4 mg total) under the tongue every 5 (five) minutes as needed for chest pain. 25 tablet 1  ? omeprazole (PRILOSEC) 40 MG capsule Take 1 capsule by mouth daily.    ? sertraline (ZOLOFT) 100 MG tablet TAKE 1 TABLET EACH DAY. 90 tablet 0  ? ?No current facility-administered medications for this encounter.  ? ? ?Allergies  ?Allergen Reactions  ? Cefazolin Hives and Itching  ?  Other reaction(s): severe hives  ?  Pseudoephedrine Hcl   ?  Other reaction(s): palpitations (moderate)  ? Ergotamine   ? Other Other (See Comments)  ? Pentobarbital   ? Pirfenidone   ? Caffeine Palpitations  ? ? ?Social History  ? ?Socioeconomic History  ? Marital status: Divorced  ?  Spouse name: Not on file  ? Number of children: 3  ? Years of education: college  ? Highest education level: Not on file  ?Occupational History  ? Occupation: Retired Engineer, site  ?Tobacco Use  ? Smoking status: Former  ?  Packs/day: 2.00  ?  Years: 18.00  ?  Pack years: 36.00  ?  Types: Cigarettes  ?  Quit date: 07/18/1970  ?  Years since quitting: 51.6  ? Smokeless tobacco: Never  ? Tobacco comments:  ?  Former smoker quit smoking in 1971  ?Vaping Use  ? Vaping Use: Never used  ?Substance and Sexual Activity  ? Alcohol use: Yes  ?  Alcohol/week: 0.0 standard drinks  ?  Comment: 10/04/2014 "might have a drink a couple times/yr"  ? Drug use: No  ? Sexual activity: Never  ?Other Topics Concern  ? Not on file  ?Social History Narrative  ? Lives at the [place]   ? Patient is right handed.  ? Patient has a college degree.  ? Patient is retired.  ?   ? Social History  ?   ? Diet? 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   ? Do you drink/eat things with caffeine? some  ?   ? Marital status?          divorced                          What year were you married? 1960  ?   ? Do you live in a house, apartment, assisted living, condo, trailer, etc.? Friends [place]    ?   ? Is it one or more stories? 1st level  ?   ? How many persons live in your home? 1  ?   ? Do you have any pets in your home? (please list) no   ?   ? Highest level of education completed? College gra

## 2022-03-05 NOTE — Plan of Care (Signed)
?  Problem: Education: ?Goal: Knowledge of General Education information will improve ?Description: Including pain rating scale, medication(s)/side effects and non-pharmacologic comfort measures ?Outcome: Progressing ?  ?Problem: Health Behavior/Discharge Planning: ?Goal: Ability to manage health-related needs will improve ?Outcome: Progressing ?  ?

## 2022-03-05 NOTE — Progress Notes (Signed)
Pt placed on CPAP using her home mask with 2 lpm bled in oxygen. ?

## 2022-03-05 NOTE — H&P (Signed)
? ? ?Primary Care Physician: [name], MD ?Primary Cardiologist: Dr Sallyanne Kuster ?Primary Electrophysiologist: Dr Curt Bears ?Referring Physician: Dr Curt Bears  ? ? ?[name] [name] is a 81 y.o. female with a history of tachybradycardia syndrome s/p PPM, diastolic CHF, MVP, OSA, idiopathic pulmonary fibrosis, CVA, aortic atherosclerosis, atrial fibrillation who presents for follow up in the [facility]. Patient is on Eliquis for a CHADS2VASC score of 8. Her afib burden was noted to be increasing on her device and she was referred to EP for evaluation. She was not felt to be a good ablation candidate and dofetilide was recommended.  ? ?On follow up today, patient presents for dofetilide admission. She denies any missed doses of anticoagulation in the past 3 weeks. She stopped resveratrol. She is actually A pacing today.  ? ?Today, she denies symptoms of palpitations, chest pain,  orthopnea, PND, lower extremity edema, dizziness, presyncope, syncope, bleeding, or neurologic sequela. The patient is tolerating medications without difficulties and is otherwise without complaint today.  ? ? ?Atrial Fibrillation Risk Factors: ? ?she does have symptoms or diagnosis of sleep apnea. ?she is compliant with CPAP therapy. ?she does not have a history of rheumatic fever. ? ? ?she has a BMI of There is no height or weight on file to calculate BMI.Marland Kitchen ?There were no vitals filed for this visit. ? ? ?Family History  ?Problem Relation Age of Onset  ? Heart attack Mother   ? Sudden death Mother   ? Stroke Father   ? Heart failure Father   ? Alcoholism Brother   ? Healthy Sister   ? Stroke Brother   ? Alpha-1 antitrypsin deficiency Daughter   ? Breast cancer Daughter   ? Prostate cancer Brother   ? Stroke Brother   ? Prostate cancer Brother   ? Healthy Brother   ? Prostate cancer Brother   ? Healthy Daughter   ? Diabetes Son   ? Arthritis Son   ? ? ? ?Atrial Fibrillation Management history: ? ?Previous antiarrhythmic  drugs: none ?Previous cardioversions: 2018 ?Previous ablations: none ?CHADS2VASC score: 8 ?Anticoagulation history: Eliquis ? ? ?Past Medical History:  ?Diagnosis Date  ? Alpha-1-antitrypsin deficiency carrier   ? Arthritis   ? "all over"  ? Atherosclerosis of aorta ([place])   ? Atrial fibrillation ([place])   ? Back pain   ? Complication of anesthesia   ? "I woke up during 2 different procedures" (10/04/2014)  ? Diastolic dysfunction   ? Frequent UTI   ? GERD (gastroesophageal reflux disease)   ? Hypertension   ? Hypertriglyceridemia   ? Insulin resistance   ? Long term current use of anticoagulant   ? Multiple lung nodules   ? Muscular deconditioning   ? MVP (mitral valve prolapse)   ? Obesity   ? OSA on CPAP   ? Osteoarthritis   ? Osteopenia   ? unspecified location  ? Pacemaker   ? PAF (paroxysmal atrial fibrillation) ([place])   ? Positive ANA (antinuclear antibody)   ? Presbyesophagus   ? Presence of permanent cardiac pacemaker   ? Primary osteoarthritis of both hands   ? neck and spine and hips  ? Pulmonary fibrosis ([place])   ? followed by pulmonolgy  ? PVC's (premature ventricular contractions)   ? Shortness of breath   ? Sinus arrest 10/2014  ? s/p Medtronic Advisa model [id number] serial number [id number]  ? Sleep apnea   ? Stroke [facility]) 01/2014  ? denies deficits on 10/04/2014  ?  Tachy-brady syndrome ([place]) 10/2014  ? TIA (transient ischemic attack)   ? Vitamin D deficiency   ? ?Past Surgical History:  ?Procedure Laterality Date  ? CARDIOVERSION N/A 09/02/2017  ? Procedure: CARDIOVERSION;  Surgeon: Sanda Klein, MD;  Location: [place];  Service: Cardiovascular;  Laterality: N/A;  ? COLONOSCOPY  2019  ? ESOPHAGEAL DILATION    ? EXCISION VAGINAL CYST    ? benign nodules  ? INSERT / REPLACE / REMOVE PACEMAKER  10/04/2014  ? Medtronic Advisa model [id number] serial number [id number]  ? LOOP RECORDER EXPLANT N/A 10/04/2014  ? Procedure: LOOP RECORDER EXPLANT;  Surgeon: Sanda Klein, MD;  Location: [place] CATH LAB;  Service:  Cardiovascular;  Laterality: N/A;  ? LOOP RECORDER IMPLANT N/A 04/19/2014  ? Procedure: LOOP RECORDER IMPLANT;  Surgeon: Sanda Klein, MD;  Location: [place] CATH LAB;  Service: Cardiovascular;  Laterality: N/A;  ? NM MYOCAR PERF WALL MOTION  12/18/2007  ? normal  ? PERMANENT PACEMAKER INSERTION N/A 10/04/2014  ? Procedure: PERMANENT PACEMAKER INSERTION;  Surgeon: Sanda Klein, MD;  Location: [place] CATH LAB;  Service: Cardiovascular;  Laterality: N/A;  ? TUBAL LIGATION    ? US ECHOCARDIOGRAPHY  05/15/2010  ? LA mildly dilated,mild mitral annular ca+, AOV mildly sclerotic, mild asymmetric LVH  ? ? ?Current Facility-Administered Medications  ?Medication Dose Route Frequency Provider Last Rate Last Admin  ? 0.9 %  sodium chloride infusion  250 mL Intravenous PRN Fenton, Clint R, PA      ? apixaban (ELIQUIS) tablet 5 mg  5 mg Oral BID Fenton, Clint R, PA      ? dofetilide (TIKOSYN) capsule 500 mcg  500 mcg Oral BID Fenton, Clint R, PA      ? [START ON 05/03/2632] folic acid (FOLVITE) tablet 0.5 mg  500 mcg Oral Daily Fenton, Clint R, PA      ? [START ON 03/06/2022] loratadine (CLARITIN) tablet 10 mg  10 mg Oral Daily Fenton, Clint R, PA      ? magnesium sulfate IVPB 2 g 50 mL  2 g Intravenous Once Constance Haw, MD 50 mL/hr at 03/05/22 1643 2 g at 03/05/22 1643  ? metoprolol tartrate (LOPRESSOR) tablet 25 mg  25 mg Oral BID Fenton, Clint R, PA      ? Nintedanib (Ofev) CAPS 100 mg  100 mg Oral BID Fenton, Clint R, PA      ? nitroGLYCERIN (NITROSTAT) SL tablet 0.4 mg  0.4 mg Sublingual Q5 min PRN Fenton, Clint R, PA      ? [START ON 03/06/2022] pantoprazole (PROTONIX) EC tablet 80 mg  80 mg Oral Daily Fenton, Clint R, PA      ? [START ON 03/06/2022] sertraline (ZOLOFT) tablet 100 mg  100 mg Oral Daily Fenton, Clint R, PA      ? sodium chloride flush (NS) 0.9 % injection 3 mL  3 mL Intravenous Q12H Fenton, Clint R, PA      ? sodium chloride flush (NS) 0.9 % injection 3 mL  3 mL Intravenous PRN Fenton, Clint R, PA       ? ? ?Allergies  ?Allergen Reactions  ? Cefazolin Hives and Itching  ?  Other reaction(s): severe hives  ? Pseudoephedrine Hcl   ?  Other reaction(s): palpitations (moderate)  ? Ergotamine   ? Other Other (See Comments)  ? Pentobarbital   ? Pirfenidone   ? Caffeine Palpitations  ? ? ?Social History  ? ?Socioeconomic History  ? Marital status: Divorced  ?  Spouse name: Not on file  ? Number of children: 3  ? Years of education: college  ? Highest education level: Not on file  ?Occupational History  ? Occupation: Retired Engineer, site  ?Tobacco Use  ? Smoking status: Former  ?  Packs/day: 2.00  ?  Years: 18.00  ?  Pack years: 36.00  ?  Types: Cigarettes  ?  Quit date: 07/18/1970  ?  Years since quitting: 51.6  ? Smokeless tobacco: Never  ? Tobacco comments:  ?  Former smoker quit smoking in 1971  ?Vaping Use  ? Vaping Use: Never used  ?Substance and Sexual Activity  ? Alcohol use: Yes  ?  Alcohol/week: 0.0 standard drinks  ?  Comment: 10/04/2014 "might have a drink a couple times/yr"  ? Drug use: No  ? Sexual activity: Never  ?Other Topics Concern  ? Not on file  ?Social History Narrative  ? Lives at the [place]   ? Patient is right handed.  ? Patient has a college degree.  ? Patient is retired.  ?   ? Social History  ?   ? Diet? 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   ? Do you drink/eat things with caffeine? some  ?   ? Marital status?          divorced                          What year were you married? 1960  ?   ? Do you live in a house, apartment, assisted living, condo, trailer, etc.? Friends [place]   ?   ? Is it one or more stories? 1st level  ?   ? How many persons live in your home? 1  ?   ? Do you have any pets in your home? (please list) no   ?   ? Highest level of education completed? College graduate  ?   ? Current or past profession: realtor   ?   ? Do you exercise?             Not much                          Type & how often? Barely any  - bed exercise for injured knee  ?   ? Advanced  Directives  ?   ? Do you have a living Ahren [place]? yes  ?   ? Do you have a DNR form?                                  If not, do you want to discuss one?yes  ?   ? Do you have signed POA/HPOA for forms? yes  ?   ? Functional Status

## 2022-03-06 ENCOUNTER — Other Ambulatory Visit (HOSPITAL_COMMUNITY): Payer: Self-pay

## 2022-03-06 LAB — BASIC METABOLIC PANEL
Anion gap: 5 (ref 5–15)
BUN: 19 mg/dL (ref 8–23)
CO2: 26 mmol/L (ref 22–32)
Calcium: 8.7 mg/dL — ABNORMAL LOW (ref 8.9–10.3)
Chloride: 107 mmol/L (ref 98–111)
Creatinine, Ser: 1.07 mg/dL — ABNORMAL HIGH (ref 0.44–1.00)
GFR, Estimated: 52 mL/min — ABNORMAL LOW (ref >60)
Glucose, Bld: 113 mg/dL — ABNORMAL HIGH (ref 70–99)
Potassium: 4.1 mmol/L (ref 3.5–5.1)
Sodium: 138 mmol/L (ref 135–145)

## 2022-03-06 LAB — MAGNESIUM: Magnesium: 2.4 mg/dL (ref 1.7–2.4)

## 2022-03-06 NOTE — Progress Notes (Addendum)
? ?Electrophysiology Rounding Note ? ?Patient Name: [name] [name] ?Date of Encounter: 03/06/2022 ? ?Primary Cardiologist: Sanda Klein, MD  ?Electrophysiologist: None  ? ? ?Subjective  ? ?Pt  remains in NSR / Apaced rhythm  on Tikosyn 500 mcg BID  ? ?QTc from EKG last pm shows stable QTc at ~430 ? ?The patient is doing well today.  At this time, the patient denies chest pain, shortness of breath, or any new concerns. ? ?Inpatient Medications  ?  ?Scheduled Meds: ? apixaban  5 mg Oral BID  ? dofetilide  500 mcg Oral BID  ? folic acid  889 mcg Oral Daily  ? loratadine  10 mg Oral Daily  ? metoprolol tartrate  25 mg Oral BID  ? Nintedanib  100 mg Oral BID  ? pantoprazole  80 mg Oral Daily  ? sertraline  100 mg Oral Daily  ? sodium chloride flush  3 mL Intravenous Q12H  ? ?Continuous Infusions: ? sodium chloride    ? ?PRN Meds: ?sodium chloride, nitroGLYCERIN, sodium chloride flush  ? ?Vital Signs  ?  ?Vitals:  ? 03/06/22 0147 03/06/22 0400 03/06/22 0500 03/06/22 0600  ?BP: 130/62 126/82  (!) 152/79  ?Pulse: 72 64  63  ?Resp: _0 ?Temp: 98 ?F (36.7 ?C) 98 ?F (36.7 ?C)    ?TempSrc: Oral Oral    ?SpO2:  94% 92% 92%  ?Weight: 125.2 kg     ?Height: _1  (1.702 m)     ? ? ?Intake/Output Summary (Last 24 hours) at 03/06/2022 0707 ?Last data filed at 03/06/2022 0300 ?Gross per 24 hour  ?Intake 200 ml  ?Output 950 ml  ?Net -750 ml  ? ?Filed Weights  ? 03/06/22 0147  ?Weight: 125.2 kg  ? ? ?Physical Exam  ?  ?GEN- The patient is well appearing, alert and oriented x 3 today.   ?Head- normocephalic, atraumatic ?Eyes-  Sclera clear, conjunctiva pink ?Ears- hearing intact ?Oropharynx- clear ?Neck- supple ?Lungs- Clear to ausculation bilaterally, normal work of breathing ?Heart- Regular rate and rhythm, no murmurs, rubs or gallops ?GI- soft, NT, ND, + BS ?Extremities- no clubbing, cyanosis, or edema ?Skin- no rash or lesion ?Psych- euthymic mood, full affect ?Neuro- strength and sensation are intact ? ?Labs  ?  ?CBC ?No results  for input(s): WBC, NEUTROABS, HGB, HCT, MCV, PLT in the last 72 hours. ?Basic Metabolic Panel ?Recent Labs  ?  03/05/22 ?1145 03/06/22 ?0229  ?NA 138 138  ?K 4.3 4.1  ?CL 108 107  ?CO2 22 26  ?GLUCOSE 153* 113*  ?BUN 18 19  ?CREATININE 1.12* 1.07*  ?CALCIUM 8.8* 8.7*  ?MG 1.9 2.4  ? ? ?Potassium  ?Date/Time Value Ref Range Status  ?03/06/2022 02:29 AM 4.1 3.5 - 5.1 mmol/L Final  ? ?Magnesium  ?Date/Time Value Ref Range Status  ?03/06/2022 02:29 AM 2.4 1.7 - 2.4 mg/dL Final  ?  Comment:  ?  Performed at [facility], [place] [address] [place], [place] [address]  ? ? ?Telemetry  ?  ?AP rhythm 60-70s (personally reviewed) ? ?Radiology  ?  ?No results found. ? ? ?Patient Profile  ?   ?BRIETTA MANSO is a 81 y.o. female with a past medical history significant for persistent atrial fibrillation.  They were admitted for tikosyn load.  ? ?Assessment & Plan  ?  ?Persistent atrial fibrillation ?Pt  remains in NSR  on Tikosyn 500 mcg BID  ?Continue Eliquis ?Electrolytes stable.  ?CHA2DS2VASC is at  least 8. ? ?Patient [name] [name] not likely require cardioversion. ? ?For questions or updates, please contact [facility] ?Please consult [email or web address] for contact info under Cardiology/STEMI. ? ?Signed, ?[name] Friar, [name] [name]  ?03/06/2022, 7:07 AM  ? ?I have seen and examined this patient with [name] [name].  Agree with above, note added to reflect my findings.  Patient remains in atrial paced rhythm.  Currently feeling well without complaints. ? ?GEN: Well nourished, well developed, in no acute distress  ?HEENT: normal  ?Neck: no JVD, carotid bruits, or masses ?Cardiac: RRR; no murmurs, rubs, or gallops,no edema  ?Respiratory:  clear to auscultation bilaterally, normal work of breathing ?GI: soft, nontender, nondistended, + BS ?MS: no deformity or atrophy  ?Skin: warm and dry, device site well healed ?Neuro:  Strength and sensation are intact ?Psych: euthymic mood, full affect  ? ?We [name] [name] fibrillation: Currently on Eliquis 5 mg  twice daily.  Also on dofetilide 500 mcg twice daily.  QTc is remained stable.  We [name] [name] continue with the current dose. ? ?[name] [name] M. Nanako Stopher MD ?03/06/2022 ?8:27 AM  ?

## 2022-03-06 NOTE — Plan of Care (Signed)
?  Problem: Education: ?Goal: Knowledge of General Education information will improve ?Description: Including pain rating scale, medication(s)/side effects and non-pharmacologic comfort measures ?Outcome: Progressing ?  ?Problem: Health Behavior/Discharge Planning: ?Goal: Ability to manage health-related needs will improve ?Outcome: Progressing ?  ?

## 2022-03-06 NOTE — Progress Notes (Signed)
Morning EKG reviewed   ? ?Shows remains in NSR at 62 bpm with stable QTc at ~440 ms. ? ?Continue  Tikosyn 500 mcg BID.  ? ?Pt will not require DCCV  ? ?Shirley Friar, PA-C  ?Pager: [phone]  ?03/06/2022 11:06 AM  ? ?

## 2022-03-06 NOTE — Care Management (Signed)
03-06-22 1554  Patient presented for Tikosyn Load. Case Manager discussed [employer] in [place], [place] Mail order with the patient and she is agreeable to the cost for a 90 day Rx for $30.00. Patient's daughter to call and enroll the patient with Lise Auer at [phone]. Please e-scribe refill Rx for 90 day supply to [facility] and the initial Rx fill we be filled at the [facility]. No further needs identified from Case Manager at this time.  ?

## 2022-03-06 NOTE — TOC Benefit Eligibility Note (Signed)
Patient Advocate Encounter ? ?Insurance verification completed.   ? ?The patient is currently admitted and upon discharge could be taking dofetilide (Tikosyn) 500 mcg. ? ?The current 30 day co-pay is, $89.37 .  ? ?The patient is insured through [employer] Part D  ? ? ?Lyndel Safe, CPhT ?Pharmacy Patient Advocate Specialist ?[facility] Patient Advocate Team ?Direct Number: [phone]  Fax: [phone] ? ? ? ? ? ?  ?

## 2022-03-06 NOTE — Plan of Care (Signed)
?  Problem: Education: ?Goal: Knowledge of General Education information will improve ?Description: Including pain rating scale, medication(s)/side effects and non-pharmacologic comfort measures ?Outcome: Progressing ?  ?Problem: Health Behavior/Discharge Planning: ?Goal: Ability to manage health-related needs will improve ?Outcome: Progressing ?  ?Problem: Education: ?Goal: Knowledge of disease or condition will improve ?Outcome: Progressing ?Goal: Understanding of medication regimen will improve ?Outcome: Progressing ?Goal: Individualized Educational Video(s) ?Outcome: Progressing ?  ?Problem: Activity: ?Goal: Ability to tolerate increased activity will improve ?Outcome: Progressing ?  ?Problem: Cardiac: ?Goal: Ability to achieve and maintain adequate cardiopulmonary perfusion will improve ?Outcome: Progressing ?  ?Problem: Health Behavior/Discharge Planning: ?Goal: Ability to safely manage health-related needs after discharge will improve ?Outcome: Progressing ?  ?

## 2022-03-07 LAB — BASIC METABOLIC PANEL
Anion gap: 8 (ref 5–15)
BUN: 21 mg/dL (ref 8–23)
CO2: 24 mmol/L (ref 22–32)
Calcium: 8.7 mg/dL — ABNORMAL LOW (ref 8.9–10.3)
Chloride: 107 mmol/L (ref 98–111)
Creatinine, Ser: 1.05 mg/dL — ABNORMAL HIGH (ref 0.44–1.00)
GFR, Estimated: 53 mL/min — ABNORMAL LOW (ref >60)
Glucose, Bld: 118 mg/dL — ABNORMAL HIGH (ref 70–99)
Potassium: 4 mmol/L (ref 3.5–5.1)
Sodium: 139 mmol/L (ref 135–145)

## 2022-03-07 LAB — MAGNESIUM: Magnesium: 2.1 mg/dL (ref 1.7–2.4)

## 2022-03-07 NOTE — Care Management Important Message (Signed)
Important Message ? ?Patient Details  ?Name: [name] [name] ?MRN: [id number] ?Date of Birth: [birth year] ? ? ?Medicare Important Message Given:  Yes ? ? ? ? ?Shelda Altes ?03/07/2022, 9:16 AM ?

## 2022-03-07 NOTE — Progress Notes (Addendum)
? ?Electrophysiology Rounding Note ? ?Patient Name: [name] [name] ?Date of Encounter: 03/07/2022 ? ?Primary Cardiologist: Sanda Klein, MD  ?Electrophysiologist: None  ? ? ?Subjective  ? ?Pt  remains in NSR  on Tikosyn 500 mcg BID  ? ?QTc from EKG last pm shows stable QTc at ~450 ? ?The patient is doing well today.  At this time, the patient denies chest pain, shortness of breath, or any new concerns. ? ?Inpatient Medications  ?  ?Scheduled Meds: ? apixaban  5 mg Oral BID  ? dofetilide  500 mcg Oral BID  ? folic acid  233 mcg Oral Daily  ? loratadine  10 mg Oral Daily  ? metoprolol tartrate  25 mg Oral BID  ? Nintedanib  100 mg Oral BID  ? pantoprazole  80 mg Oral Daily  ? sertraline  100 mg Oral Daily  ? sodium chloride flush  3 mL Intravenous Q12H  ? ?Continuous Infusions: ? sodium chloride    ? ?PRN Meds: ?sodium chloride, nitroGLYCERIN, sodium chloride flush  ? ?Vital Signs  ?  ?Vitals:  ? 03/06/22 2000 03/06/22 2033 03/07/22 0000 03/07/22 0400  ?BP: 140/71 140/71 138/68 136/64  ?Pulse: 64 62 60 61  ?Resp: _0 ?Temp: 98.1 ?F (36.7 ?C) 98.1 ?F (36.7 ?C) 98 ?F (36.7 ?C) 98 ?F (36.7 ?C)  ?TempSrc: Oral Oral Oral Oral  ?SpO2: 90% 90%  90%  ?Weight:      ?Height:      ? ? ?Intake/Output Summary (Last 24 hours) at 03/07/2022 0703 ?Last data filed at 03/06/2022 1400 ?Gross per 24 hour  ?Intake 480 ml  ?Output --  ?Net 480 ml  ? ?Filed Weights  ? 03/06/22 0147  ?Weight: 125.2 kg  ? ? ?Physical Exam  ?  ?GEN- The patient is well appearing, alert and oriented x 3 today.   ?Head- normocephalic, atraumatic ?Eyes-  Sclera clear, conjunctiva pink ?Ears- hearing intact ?Oropharynx- clear ?Neck- supple ?Lungs- Clear to ausculation bilaterally, normal work of breathing ?Heart- Regular rate and rhythm, no murmurs, rubs or gallops ?GI- soft, NT, ND, + BS ?Extremities- no clubbing, cyanosis, or edema ?Skin- no rash or lesion ?Psych- euthymic mood, full affect ?Neuro- strength and sensation are intact ? ?Labs  ?  ?CBC ?No  results for input(s): WBC, NEUTROABS, HGB, HCT, MCV, PLT in the last 72 hours. ?Basic Metabolic Panel ?Recent Labs  ?  03/05/22 ?1145 03/06/22 ?0229  ?NA 138 138  ?K 4.3 4.1  ?CL 108 107  ?CO2 22 26  ?GLUCOSE 153* 113*  ?BUN 18 19  ?CREATININE 1.12* 1.07*  ?CALCIUM 8.8* 8.7*  ?MG 1.9 2.4  ? ? ?Potassium  ?Date/Time Value Ref Range Status  ?03/06/2022 02:29 AM 4.1 3.5 - 5.1 mmol/L Final  ? ?Magnesium  ?Date/Time Value Ref Range Status  ?03/06/2022 02:29 AM 2.4 1.7 - 2.4 mg/dL Final  ?  Comment:  ?  Performed at [facility], [address], [place], [place] [address]  ? ? ?Telemetry  ?  ?AP rhythm in the 60s (personally reviewed) ? ?Radiology  ?  ?No results found. ? ? ?Patient Profile  ?   ?[name] [name] is a 81 y.o. female with a past medical history significant for persistent atrial fibrillation.  They were admitted for tikosyn load.  ? ?Assessment & Plan  ?  ?Persistent atrial fibrillation ?Pt  remains in NSR  on Tikosyn 500 mcg BID  ?Continue Eliquis ?Labs are pending.  ?CHA2DS2VASC is at  least 8. ? ?Plan for home tomorrow if QTc remains stable. ? ? ?For questions or updates, please contact [place] ?Please consult [email or web address] for contact info under Cardiology/STEMI. ? ?Signed, ?Shirley Friar, PA-C  ?03/07/2022, 7:03 AM  ? ?I have seen and examined this patient with Oda Kilts.  Agree with above, note added to reflect my findings.  Currently feeling well.  Is remained in sinus rhythm. ? ?GEN: Well nourished, well developed, in no acute distress  ?HEENT: normal  ?Neck: no JVD, carotid bruits, or masses ?Cardiac: RRR; no murmurs, rubs, or gallops,no edema  ?Respiratory:  clear to auscultation bilaterally, normal work of breathing ?GI: soft, nontender, nondistended, + BS ?MS: no deformity or atrophy  ?Skin: warm and dry, device site well healed ?Neuro:  Strength and sensation are intact ?Psych: euthymic mood, full affect  ? ?Persistent atrial fibrillation: Currently on Eliquis 5 mg twice  daily.  Admission for dofetilide.  Currently on 500 mcg twice daily.  QTc is remained stable.  We Chidi Shirer continue with the current dose.  Likely discharge tomorrow. ? ?Jefrey Raburn M. Daryll Spisak MD ?03/07/2022 ?8:42 AM  ?

## 2022-03-07 NOTE — Progress Notes (Signed)
Pharmacy: Dofetilide (Tikosyn) - Follow Up ?Assessment and Electrolyte Replacement ? ?Pharmacy consulted to assist in monitoring and replacing electrolytes in this 81 y.o. female admitted on 03/05/2022 undergoing dofetilide initiation. First dofetilide dose: 03/05/22 ? ?Labs: ?   ?Component Value Date/Time  ? K 4.0 03/07/2022 0716  ? MG 2.1 03/07/2022 0716  ?  ? ?Plan: ?Potassium: ?K >/= 4: No additional supplementation needed ? ?Magnesium: ?Mg > 2: No additional supplementation needed ? ? ?Arrie Senate, PharmD, BCPS, BCCP ?Clinical Pharmacist ?[phone] ?Please check AMION for all [place] numbers ?03/07/2022 ? ?

## 2022-03-07 NOTE — Progress Notes (Signed)
Morning EKG reviewed   ? ?Shows remains in NSR at 66 bpm with stable QTc at ~460 ms. ? ?Continue  Tikosyn 500 mcg BID.  ? ?Plan for home tomorrow if QTc remains stable.  ? ?Shirley Friar, PA-C  ?Pager: [phone]  ?03/07/2022 12:15 PM  ? ?

## 2022-03-08 ENCOUNTER — Other Ambulatory Visit (HOSPITAL_COMMUNITY): Payer: Self-pay

## 2022-03-08 LAB — BASIC METABOLIC PANEL
Anion gap: 7 (ref 5–15)
BUN: 22 mg/dL (ref 8–23)
CO2: 26 mmol/L (ref 22–32)
Calcium: 8.6 mg/dL — ABNORMAL LOW (ref 8.9–10.3)
Chloride: 106 mmol/L (ref 98–111)
Creatinine, Ser: 1.14 mg/dL — ABNORMAL HIGH (ref 0.44–1.00)
GFR, Estimated: 48 mL/min — ABNORMAL LOW (ref >60)
Glucose, Bld: 103 mg/dL — ABNORMAL HIGH (ref 70–99)
Potassium: 3.7 mmol/L (ref 3.5–5.1)
Sodium: 139 mmol/L (ref 135–145)

## 2022-03-08 LAB — MAGNESIUM: Magnesium: 2 mg/dL (ref 1.7–2.4)

## 2022-03-08 MED ORDER — POTASSIUM CHLORIDE CRYS ER 20 MEQ PO TBCR
40.0000 meq | EXTENDED_RELEASE_TABLET | Freq: Once | ORAL | Status: AC
  Administered 2022-03-08: 40 meq via ORAL
  Filled 2022-03-08: qty 2

## 2022-03-08 MED ORDER — DOFETILIDE 500 MCG PO CAPS
500.0000 ug | ORAL_CAPSULE | Freq: Two times a day (BID) | ORAL | 6 refills | Status: DC
  Filled 2022-03-08: qty 60, 30d supply, fill #0

## 2022-03-08 NOTE — Discharge Summary (Addendum)
? ? ? ?ELECTROPHYSIOLOGY PROCEDURE DISCHARGE SUMMARY  ? ? ?[name],  ?MRN: [id number], DOB/AGE: [birth year] 81 y.o. ? ?Admit date: 03/05/2022 ?Discharge date: 03/08/2022 ? ?Primary Care Physician: [name], MD  ?Primary Cardiologist: Sanda Klein, MD  ?Electrophysiologist: None  ? ?Primary Discharge Diagnosis:  ?1.  Persistent atrial fibrillation status post Tikosyn loading this admission ? ? ?Allergies  ?Allergen Reactions  ? Cefazolin Hives and Itching  ?  Other reaction(s): severe hives  ? Pseudoephedrine Hcl   ?  Other reaction(s): palpitations (moderate)  ? Ergotamine   ?  unknown  ? Other Other (See Comments)  ? Pentobarbital   ?  unknown  ? Pirfenidone   ?  unknown  ? Caffeine Palpitations  ? ? ? ?Procedures This Admission:  ?1.  Tikosyn loading ? ?Brief HPI: ?[name] [name] is a 81 y.o. female with a past medical history as noted above.  They were referred to EP in the outpatient setting for treatment options of atrial fibrillation.  Risks, benefits, and alternatives to Tikosyn were reviewed with the patient who wished to proceed.   ? ?Hospital Course:  ?The patient was admitted and Tikosyn was initiated.  Renal function and electrolytes were followed during the hospitalization.  Their QTc remained stable. She presented in an A paced rhythm and did not require cardioversion. They were monitored until discharge on telemetry which demonstrated stable rhythm and QTc.  On the day of discharge, they were examined by Dr. Curt Bears  who considered them stable for discharge to home.  Follow-up has been arranged with the Atrial Fibrillation clinic in approximately 1 week and with  EP APP   in 4 weeks.  ? ?Physical Exam: ?Vitals:  ? 03/07/22 1348 03/07/22 2017 03/08/22 0525 03/08/22 3295  ?BP: 119/71 121/63 (!) 144/66 131/85  ?Pulse:  63 63 66  ?Resp: 16 16    ?Temp: 97.6 ?F (36.4 ?C) 98 ?F (36.7 ?C) 97.7 ?F (36.5 ?C)   ?TempSrc: Oral Oral Oral   ?SpO2: 94% 93% 94%   ?Weight:      ?Height:       ? ? ?GEN- The patient is well appearing, alert and oriented x 3 today.   ?HEENT: normocephalic, atraumatic; sclera clear, conjunctiva pink; hearing intact; oropharynx clear; neck supple, no JVP ?Lymph- no cervical lymphadenopathy ?Lungs- Clear to ausculation bilaterally, normal work of breathing.  No wheezes, rales, rhonchi ?Heart- Regular rate and rhythm, no murmurs, rubs or gallops, PMI not laterally displaced ?GI- soft, non-tender, non-distended, bowel sounds present, no hepatosplenomegaly ?Extremities- no clubbing, cyanosis, or edema; DP/PT/radial pulses 2+ bilaterally ?MS- no significant deformity or atrophy ?Skin- warm and dry, no rash or lesion ?Psych- euthymic mood, full affect ?Neuro- strength and sensation are intact ? ? ?Labs: ?  ?Lab Results  ?Component Value Date  ? WBC 10.7 (H) 12/15/2021  ? HGB 15.6 (H) 12/15/2021  ? HCT 48.5 (H) 12/15/2021  ? MCV 85.5 12/15/2021  ? PLT 286 12/15/2021  ?  ?Recent Labs  ?Lab 03/08/22 ?1884  ?NA 139  ?K 3.7  ?CL 106  ?CO2 26  ?BUN 22  ?CREATININE 1.14*  ?CALCIUM 8.6*  ?GLUCOSE 103*  ? ? ? ?Discharge Medications:  ?Allergies as of 03/08/2022   ? ?   Reactions  ? Cefazolin Hives, Itching  ? Other reaction(s): severe hives  ? Pseudoephedrine Hcl   ? Other reaction(s): palpitations (moderate)  ? Ergotamine   ? unknown  ? Other Other (See Comments)  ? Pentobarbital   ?  unknown  ? Pirfenidone   ? unknown  ? Caffeine Palpitations  ? ?  ? ?  ?Medication List  ?  ? ?TAKE these medications   ? ?apixaban 5 MG Tabs tablet ?Commonly known as: Eliquis ?Take 1 tablet (5 mg total) by mouth 2 (two) times daily. ?  ?Biotin 5000 MCG Caps ?Take 5,000 mcg by mouth daily. ?  ?CALCIUM 1200 PO ?Take 1,200 mg by mouth daily. ?  ?cholecalciferol 1000 units tablet ?Commonly known as: VITAMIN D ?Take 6,000 Units by mouth daily. ?  ?Co-Enzyme Q10 100 MG Caps ?Take 100 mg by mouth daily. ?  ?diazepam 5 MG tablet ?Commonly known as: VALIUM ?TAKE 1 TABLET EACH DAY. ?What changed:  ?how much to  take ?when to take this ?reasons to take this ?additional instructions ?  ?dofetilide 500 MCG capsule ?Commonly known as: TIKOSYN ?Take 1 capsule (500 mcg total) by mouth 2 (two) times daily. ?  ?fluticasone 50 MCG/ACT nasal spray ?Commonly known as: FLONASE ?Place 2 sprays into both nostrils daily as needed for allergies or rhinitis. ?  ?folic acid 161 MCG tablet ?Commonly known as: FOLVITE ?Take 400 mcg by mouth daily. ?  ?glucosamine-chondroitin 500-400 MG tablet ?Take 1 tablet by mouth 2 (two) times daily. ?  ?loratadine 10 MG tablet ?Commonly known as: CLARITIN ?Take 10 mg by mouth daily. ?  ?MAGNESIUM CITRATE PO ?Take 400 mg by mouth daily. 1 tab daily ?  ?metoprolol tartrate 25 MG tablet ?Commonly known as: LOPRESSOR ?Take 1 tablet (25 mg total) by mouth 2 (two) times daily. ?  ?nitroGLYCERIN 0.4 MG SL tablet ?Commonly known as: NITROSTAT ?Place 1 tablet (0.4 mg total) under the tongue every 5 (five) minutes as needed for chest pain. ?  ?Ofev 100 MG Caps ?Generic drug: Nintedanib ?Take 1 capsule (100 mg total) by mouth 2 (two) times daily. ?  ?omeprazole 40 MG capsule ?Commonly known as: PRILOSEC ?Take 1 capsule by mouth daily. ?  ?sertraline 100 MG tablet ?Commonly known as: ZOLOFT ?TAKE 1 TABLET EACH DAY. ?What changed: See the new instructions. ?  ?SUPER B COMPLEX PO ?Take 1 tablet by mouth daily. ?  ?TYLENOL ARTHRITIS PAIN PO ?Take 2 tablets by mouth daily as needed (For pain). ?  ?ZINC PO ?Take 1 tablet by mouth daily. ?  ? ?  ? ? ?Disposition:  ? ? Follow-up Information   ? ? [facility] Follow up.   ?Specialty: Cardiology ?Why: on 4/14 at 10 am for post hospital follow up ?Contact information: ?[address] ?[id number] mc ?[place] ?[phone] ? ?  ?  ? ?  ?  ? ?  ? ? ?Duration of Discharge Encounter: Greater than 30 minutes including physician time. ? ?Signed, ?Shirley Friar, PA-C  ?03/08/2022 ?10:36 AM ? ? ? ?I have seen and examined  this patient with Oda Kilts.  Agree with above, note added to reflect my findings.  Patient remains in sinus rhythm while on dofetilide.  She is quite happy with her control.  Ready to go home today. ? ?GEN: Well nourished, well developed, in no acute distress  ?HEENT: normal  ?Neck: no JVD, carotid bruits, or masses ?Cardiac: RRR; no murmurs, rubs, or gallops,no edema  ?Respiratory:  clear to auscultation bilaterally, normal work of breathing ?GI: soft, nontender, nondistended, + BS ?MS: no deformity or atrophy  ?Skin: warm and dry, device site well healed ?Neuro:  Strength and sensation are intact ?Psych: euthymic mood, full affect  ? ?  Persistent atrial fibrillation: Patient presented for dofetilide load.  QTc remained stable.  We Dannielle Baskins plan for discharge today with follow-up in atrial fibrillation clinic. ?Morbid obesity:Body mass index is 43.23 kg/m?Marland Kitchen  Weight loss with diet and exercise encouraged ?Stage II CKD: plan per primary care ? ?Kary Colaizzi M. Lucita Montoya MD ?03/08/2022 ?3:10 PM  ?

## 2022-03-08 NOTE — Progress Notes (Signed)
EKG from yesterday evening 03/07/2022 reviewed   ? ?Shows remains in NSR at 60 bpm with stable QTc at ~460-470 ms. ? ?Continue  Tikosyn 500 mcg BID.  ? ?K 3.7, Mg 2.0. Supp both.  ? ?Home this afternoon if QT remains stable.    ? ?Shirley Friar, PA-C  ?Pager: [phone]  ?03/08/2022 7:13 AM  ? ?

## 2022-03-08 NOTE — Progress Notes (Signed)
Pharmacy: Dofetilide (Tikosyn) - Follow Up ?Assessment and Electrolyte Replacement ? ?Pharmacy consulted to assist in monitoring and replacing electrolytes in this 81 y.o. female admitted on 03/05/2022 undergoing dofetilide initiation. First dofetilide dose: 500 mcg bid ? ?Labs: ?   ?Component Value Date/Time  ? K 3.7 03/08/2022 0247  ? MG 2.0 03/08/2022 0247  ?  ? ?Plan: ?Potassium: ?K 3.5-3.7:  Given 40 meq x 1 by PA today. ? ?Magnesium: ?Mg > 2: No additional supplementation needed ? ? ? ?Thank you for allowing pharmacy to participate in this patient's care  ? ?[place] Crane, Pharm D, BCPS, BCCP ?Clinical Pharmacist ? 03/08/2022 11:16 AM  ? ?[facility] pharmacy phone numbers are listed on [email or web address] ? ?

## 2022-03-12 ENCOUNTER — Other Ambulatory Visit: Payer: Medicare Other

## 2022-03-12 ENCOUNTER — Encounter: Payer: Self-pay | Admitting: Cardiology

## 2022-03-12 NOTE — Telephone Encounter (Signed)
Was going to send RX in for patient, but noted that she is a new start on Tikosyn and has follow up appt on 4/14. Appt notes show that pt wants to use mail order. Informed pt that they will need to send this in for her once they follow up and establish her dose. ?

## 2022-03-14 ENCOUNTER — Telehealth: Payer: Self-pay | Admitting: Internal Medicine

## 2022-03-14 NOTE — Telephone Encounter (Signed)
Fax received from Dr. Ronnette Juniper to perform a Endoscopy on patient.  Patient needs surgery clearance. Patient was seen on 11/09/21. Has been scheduled with Dr. Chase Caller on 05/31/2022. Office protocol is a risk assessment can be sent to surgeon if patient has been seen in 60 days or less.  ? ?Sending to Dr. Chase Caller for risk assessment or recommendations if patient needs to be seen in office prior to surgical procedure.   ?

## 2022-03-15 ENCOUNTER — Ambulatory Visit (HOSPITAL_COMMUNITY)
Admit: 2022-03-15 | Discharge: 2022-03-15 | Disposition: A | Discharge status: Home / Self Care | Payer: Medicare Other | Source: Ambulatory Visit | Attending: Nurse Practitioner | Admitting: Nurse Practitioner

## 2022-03-15 ENCOUNTER — Encounter (HOSPITAL_COMMUNITY): Payer: Self-pay | Admitting: Nurse Practitioner

## 2022-03-15 VITALS — BP 132/72 | HR 69 | Ht 67.0 in | Wt 273.8 lb

## 2022-03-15 DIAGNOSIS — I480 Paroxysmal atrial fibrillation: Secondary | ICD-10-CM | POA: Diagnosis not present

## 2022-03-15 DIAGNOSIS — G4733 Obstructive sleep apnea (adult) (pediatric): Secondary | ICD-10-CM | POA: Diagnosis not present

## 2022-03-15 DIAGNOSIS — I341 Nonrheumatic mitral (valve) prolapse: Secondary | ICD-10-CM | POA: Insufficient documentation

## 2022-03-15 DIAGNOSIS — Z09 Encounter for follow-up examination after completed treatment for conditions other than malignant neoplasm: Secondary | ICD-10-CM | POA: Insufficient documentation

## 2022-03-15 DIAGNOSIS — D6869 Other thrombophilia: Secondary | ICD-10-CM | POA: Insufficient documentation

## 2022-03-15 DIAGNOSIS — Z7901 Long term (current) use of anticoagulants: Secondary | ICD-10-CM | POA: Insufficient documentation

## 2022-03-15 DIAGNOSIS — I700 Atherosclerosis of aorta: Secondary | ICD-10-CM | POA: Insufficient documentation

## 2022-03-15 DIAGNOSIS — I495 Sick sinus syndrome: Secondary | ICD-10-CM | POA: Insufficient documentation

## 2022-03-15 DIAGNOSIS — Z8673 Personal history of transient ischemic attack (TIA), and cerebral infarction without residual deficits: Secondary | ICD-10-CM | POA: Insufficient documentation

## 2022-03-15 DIAGNOSIS — E669 Obesity, unspecified: Secondary | ICD-10-CM | POA: Insufficient documentation

## 2022-03-15 DIAGNOSIS — I5030 Unspecified diastolic (congestive) heart failure: Secondary | ICD-10-CM | POA: Diagnosis not present

## 2022-03-15 DIAGNOSIS — Z9989 Dependence on other enabling machines and devices: Secondary | ICD-10-CM | POA: Insufficient documentation

## 2022-03-15 DIAGNOSIS — J84112 Idiopathic pulmonary fibrosis: Secondary | ICD-10-CM | POA: Diagnosis not present

## 2022-03-15 LAB — BASIC METABOLIC PANEL
Anion gap: 8 (ref 5–15)
BUN: 18 mg/dL (ref 8–23)
CO2: 23 mmol/L (ref 22–32)
Calcium: 8.9 mg/dL (ref 8.9–10.3)
Chloride: 105 mmol/L (ref 98–111)
Creatinine, Ser: 1.05 mg/dL — ABNORMAL HIGH (ref 0.44–1.00)
GFR, Estimated: 53 mL/min — ABNORMAL LOW (ref >60)
Glucose, Bld: 184 mg/dL — ABNORMAL HIGH (ref 70–99)
Potassium: 4.1 mmol/L (ref 3.5–5.1)
Sodium: 136 mmol/L (ref 135–145)

## 2022-03-15 LAB — MAGNESIUM: Magnesium: 1.8 mg/dL (ref 1.7–2.4)

## 2022-03-15 MED ORDER — DOFETILIDE 500 MCG PO CAPS: 500.0000 ug | ORAL_CAPSULE | Freq: Two times a day (BID) | ORAL | 2 refills | Status: DC

## 2022-03-15 MED ORDER — MAGNESIUM 400 MG PO CAPS
400.0000 mg | ORAL_CAPSULE | Freq: Every day | ORAL | 0 refills | Status: AC
Start: 2022-03-15 — End: ?

## 2022-03-15 NOTE — Telephone Encounter (Signed)
Spoke with the pt  ?She is aware of response per MR  ?She states that she does not want to see APP and wishes to keep the appt she already has planned with MR for June ?Nothing further needed ?Sending back to surgery pool  ?

## 2022-03-15 NOTE — Progress Notes (Signed)
? ? ?Primary Care Physician: [name], MD ?Primary Cardiologist: Dr Sallyanne Kuster ?Primary Electrophysiologist: Dr Curt Bears ?Referring Physician: Dr Curt Bears  ? ? ?[name] [name] is a 81 y.o. female with a history of tachybradycardia syndrome s/p PPM, diastolic CHF, MVP, OSA, idiopathic pulmonary fibrosis, CVA, aortic atherosclerosis, atrial fibrillation who presents for follow up in the [facility]. Patient is on Eliquis for a CHADS2VASC score of 8. Her afib burden was noted to be increasing on her device and she was referred to EP for evaluation. She was not felt to be a good ablation candidate and dofetilide was recommended.  ? ?On follow up today, patient presents for dofetilide admission. She denies any missed doses of anticoagulation in the past 3 weeks. She stopped resveratrol. She is actually A pacing today.  ? ?F/u 03/15/22 in afib clinic for one week f/u Tikosyn admit. She is a paced today. Qt stable. However, she is a little disappointed that she does not feel that much better in SR. ? ?Today, she denies symptoms of palpitations, chest pain,  orthopnea, PND, lower extremity edema, dizziness, presyncope, syncope, bleeding, or neurologic sequela. The patient is tolerating medications without difficulties and is otherwise without complaint today.  ? ? ?Atrial Fibrillation Risk Factors: ? ?she does have symptoms or diagnosis of sleep apnea. ?she is compliant with CPAP therapy. ?she does not have a history of rheumatic fever. ? ? ?she has a BMI of There is no height or weight on file to calculate BMI.Marland Kitchen ?There were no vitals filed for this visit. ? ? ?Family History  ?Problem Relation Age of Onset  ? Heart attack Mother   ? Sudden death Mother   ? Stroke Father   ? Heart failure Father   ? Alcoholism Brother   ? Healthy Sister   ? Stroke Brother   ? Alpha-1 antitrypsin deficiency Daughter   ? Breast cancer Daughter   ? Prostate cancer Brother   ? Stroke Brother   ? Prostate cancer Brother    ? Healthy Brother   ? Prostate cancer Brother   ? Healthy Daughter   ? Diabetes Son   ? Arthritis Son   ? ? ? ?Atrial Fibrillation Management history: ? ?Previous antiarrhythmic drugs: none ?Previous cardioversions: 2018 ?Previous ablations: none ?CHADS2VASC score: 8 ?Anticoagulation history: Eliquis ? ? ?Past Medical History:  ?Diagnosis Date  ? Alpha-1-antitrypsin deficiency carrier   ? Arthritis   ? "all over"  ? Atherosclerosis of aorta ([place])   ? Atrial fibrillation ([place])   ? Back pain   ? Complication of anesthesia   ? "I woke up during 2 different procedures" (10/04/2014)  ? Diastolic dysfunction   ? Frequent UTI   ? GERD (gastroesophageal reflux disease)   ? Hypertension   ? Hypertriglyceridemia   ? Insulin resistance   ? Long term current use of anticoagulant   ? Multiple lung nodules   ? Muscular deconditioning   ? MVP (mitral valve prolapse)   ? Obesity   ? OSA on CPAP   ? Osteoarthritis   ? Osteopenia   ? unspecified location  ? Pacemaker   ? PAF (paroxysmal atrial fibrillation) ([place])   ? Positive ANA (antinuclear antibody)   ? Presbyesophagus   ? Presence of permanent cardiac pacemaker   ? Primary osteoarthritis of both hands   ? neck and spine and hips  ? Pulmonary fibrosis ([place])   ? followed by pulmonolgy  ? PVC's (premature ventricular contractions)   ? Shortness of  breath   ? Sinus arrest 10/2014  ? s/p Medtronic Advisa model [id number] serial number [id number]  ? Sleep apnea   ? Stroke [facility]) 01/2014  ? denies deficits on 10/04/2014  ? Tachy-brady syndrome ([place]) 10/2014  ? TIA (transient ischemic attack)   ? Vitamin D deficiency   ? ?Past Surgical History:  ?Procedure Laterality Date  ? CARDIOVERSION N/A 09/02/2017  ? Procedure: CARDIOVERSION;  Surgeon: Sanda Klein, MD;  Location: [facility];  Service: Cardiovascular;  Laterality: N/A;  ? COLONOSCOPY  2019  ? ESOPHAGEAL DILATION    ? EXCISION VAGINAL CYST    ? benign nodules  ? INSERT / REPLACE / REMOVE PACEMAKER  10/04/2014  ? Medtronic Advisa model  [id number] serial number [id number]  ? LOOP RECORDER EXPLANT N/A 10/04/2014  ? Procedure: LOOP RECORDER EXPLANT;  Surgeon: Sanda Klein, MD;  Location: [facility] CATH LAB;  Service: Cardiovascular;  Laterality: N/A;  ? LOOP RECORDER IMPLANT N/A 04/19/2014  ? Procedure: LOOP RECORDER IMPLANT;  Surgeon: Sanda Klein, MD;  Location: [facility] CATH LAB;  Service: Cardiovascular;  Laterality: N/A;  ? NM MYOCAR PERF WALL MOTION  12/18/2007  ? normal  ? PERMANENT PACEMAKER INSERTION N/A 10/04/2014  ? Procedure: PERMANENT PACEMAKER INSERTION;  Surgeon: Sanda Klein, MD;  Location: [facility] CATH LAB;  Service: Cardiovascular;  Laterality: N/A;  ? TUBAL LIGATION    ? US ECHOCARDIOGRAPHY  05/15/2010  ? LA mildly dilated,mild mitral annular ca+, AOV mildly sclerotic, mild asymmetric LVH  ? ? ?Current Outpatient Medications  ?Medication Sig Dispense Refill  ? Acetaminophen (TYLENOL ARTHRITIS PAIN PO) Take 2 tablets by mouth daily as needed (For pain).    ? apixaban (ELIQUIS) 5 MG TABS tablet Take 1 tablet (5 mg total) by mouth 2 (two) times daily. 180 tablet 1  ? B Complex-C (SUPER B COMPLEX PO) Take 1 tablet by mouth daily.    ? Biotin 5000 MCG CAPS Take 5,000 mcg by mouth daily.    ? Calcium Carbonate-Vit D-Min (CALCIUM 1200 PO) Take 1,200 mg by mouth daily.     ? cholecalciferol (VITAMIN D) 1000 UNITS tablet Take 6,000 Units by mouth daily.     ? Co-Enzyme Q10 100 MG CAPS Take 100 mg by mouth daily.     ? diazepam (VALIUM) 5 MG tablet TAKE 1 TABLET EACH DAY. 30 tablet 0  ? dofetilide (TIKOSYN) 500 MCG capsule Take 1 capsule (500 mcg total) by mouth 2 (two) times daily. 60 capsule 6  ? fluticasone (FLONASE) 50 MCG/ACT nasal spray Place 2 sprays into both nostrils daily as needed for allergies or rhinitis.    ? folic acid (FOLVITE) 270 MCG tablet Take 400 mcg by mouth daily.    ? glucosamine-chondroitin 500-400 MG tablet Take 1 tablet by mouth 2 (two) times daily.     ? loratadine (CLARITIN) 10 MG tablet Take 10 mg by mouth daily.    ? metoprolol  tartrate (LOPRESSOR) 25 MG tablet Take 1 tablet (25 mg total) by mouth 2 (two) times daily. 180 tablet 3  ? Multiple Vitamins-Minerals (ZINC PO) Take 1 tablet by mouth daily.    ? Nintedanib (OFEV) 100 MG CAPS Take 1 capsule (100 mg total) by mouth 2 (two) times daily. 180 capsule 3  ? nitroGLYCERIN (NITROSTAT) 0.4 MG SL tablet Place 1 tablet (0.4 mg total) under the tongue every 5 (five) minutes as needed for chest pain. 25 tablet 1  ? omeprazole (PRILOSEC) 40 MG capsule Take 1 capsule by mouth daily.    ?  sertraline (ZOLOFT) 100 MG tablet TAKE 1 TABLET EACH DAY. 90 tablet 0  ? ?No current facility-administered medications for this encounter.  ? ? ?Allergies  ?Allergen Reactions  ? Cefazolin Hives and Itching  ?  Other reaction(s): severe hives  ? Pseudoephedrine Hcl   ?  Other reaction(s): palpitations (moderate)  ? Ergotamine   ?  unknown  ? Other Other (See Comments)  ? Pentobarbital   ?  unknown  ? Pirfenidone   ?  unknown  ? Caffeine Palpitations  ? ? ?Social History  ? ?Socioeconomic History  ? Marital status: Divorced  ?  Spouse name: Not on file  ? Number of children: 3  ? Years of education: college  ? Highest education level: Not on file  ?Occupational History  ? Occupation: Retired Engineer, site  ?Tobacco Use  ? Smoking status: Former  ?  Packs/day: 2.00  ?  Years: 18.00  ?  Pack years: 36.00  ?  Types: Cigarettes  ?  Quit date: 07/18/1970  ?  Years since quitting: 51.6  ? Smokeless tobacco: Never  ? Tobacco comments:  ?  Former smoker quit smoking in 1971  ?Vaping Use  ? Vaping Use: Never used  ?Substance and Sexual Activity  ? Alcohol use: Yes  ?  Alcohol/week: 0.0 standard drinks  ?  Comment: 10/04/2014 "might have a drink a couple times/yr"  ? Drug use: No  ? Sexual activity: Never  ?Other Topics Concern  ? Not on file  ?Social History Narrative  ? Lives at the [place]   ? Patient is right handed.  ? Patient has a college degree.  ? Patient is retired.  ?   ? Social History  ?   ? Diet? Awful  sweets are a terrible addiction   ?   ? Do you drink/eat things with caffeine? some  ?   ? Marital status?          divorced                          What year were you married? 1960  ?   ? Do you live

## 2022-03-15 NOTE — Addendum Note (Signed)
Encounter addended by: Hinda Kehr, CMA on: 03/15/2022 2:12 PM ? Actions taken: Order Reconciliation Section accessed, Order list changed

## 2022-03-15 NOTE — Telephone Encounter (Signed)
She has had some admit fo variousI issies. NEed to make sure IPF is not worse. LAst PFT oct 2022 ? ?Plan ? - to be seen by me or  APP in 1-2 weks. IDeally needsspiro.dlco before - if not just  a simple walk test ? ? ? ? ? ? ?  Latest Ref Rng & Units 09/26/2021  ?  2:58 PM 05/07/2021  ?  3:12 PM 08/10/2020  ? 10:03 AM 07/19/2019  ?  3:20 PM 02/24/2017  ? 11:58 AM  ?PFT Results  ?FVC-Pre L 2.38   2.53   2.57   2.74   2.58    ?FVC-Predicted Pre % 79   84   84   93   85    ?FVC-Post L    2.63   2.45    ?FVC-Predicted Post %    89   81    ?Pre FEV1/FVC % % 87   86   86   86   82    ?Post FEV1/FCV % %    90   87    ?FEV1-Pre L 2.06   2.17   2.22   2.36   2.12    ?FEV1-Predicted Pre % 92   97   97   107   93    ?FEV1-Post L    2.35   2.14    ?DLCO uncorrected ml/min/mmHg 9.76   15.50   15.83   16.52   16.15    ?DLCO UNC% % 47   74   76   81   59    ?DLCO corrected ml/min/mmHg 9.48   15.50   15.34    14.93    ?DLCO COR %Predicted % 45   74   73    55    ?DLVA Predicted % 77   105   97   104   79    ?TLC L   4.50   3.97     ?TLC % Predicted %   81   74     ?RV % Predicted %   71   52     ? ? ?

## 2022-03-19 DIAGNOSIS — Z6841 Body Mass Index (BMI) 40.0 and over, adult: Secondary | ICD-10-CM | POA: Diagnosis not present

## 2022-03-19 DIAGNOSIS — I4819 Other persistent atrial fibrillation: Secondary | ICD-10-CM | POA: Diagnosis not present

## 2022-03-28 ENCOUNTER — Ambulatory Visit (HOSPITAL_COMMUNITY)
Admission: RE | Admit: 2022-03-28 | Discharge: 2022-03-28 | Disposition: A | Discharge status: Home / Self Care | Payer: Medicare Other | Source: Ambulatory Visit | Attending: Physician Assistant | Admitting: Physician Assistant

## 2022-03-28 DIAGNOSIS — I480 Paroxysmal atrial fibrillation: Secondary | ICD-10-CM | POA: Diagnosis not present

## 2022-03-28 LAB — MAGNESIUM: Magnesium: 2.1 mg/dL (ref 1.7–2.4)

## 2022-03-29 ENCOUNTER — Inpatient Hospital Stay: Admission: RE | Admit: 2022-03-29 | Payer: Medicare Other | Source: Ambulatory Visit

## 2022-04-04 ENCOUNTER — Telehealth: Payer: Self-pay | Admitting: Internal Medicine

## 2022-04-04 NOTE — Telephone Encounter (Signed)
? ? ?  Patient told [facility] team [name] [name] -CRN she had questions about [employer] study. Called her 4:01 PM ? 04/04/2022  . Most of questions had to dow with general trial questions. WE went over ? ? 1. Scientific Purpose ? ?Clinical research is designed to produce generalizable knowledge and to answer questions about the safety and efficacy of intervention(s) under study in order to determine whether or not they may be useful for the care of future patients. ? ?2. Study Procedures ? ?Participation in a trial may involve procedures or tests, in addition to the intervention(s) under study, that are intended only or primarily to generate scientific knowledge and that are otherwise not necessary for patient care. ? ? 3. Uncertainty ? ?For intervention(s) under study in clinical research, there often is less knowledge and more uncertainty about the risks and benefits to a population of trial participants than there is when a doctor offers a patient standard interventions. ? ? 4. Adherence to Protocol ? ?Administration of the intervention(s) under study is typically based on a strict protocol with defined dose, scheduling, and use or avoidance of concurrent medications, compared to administration of standard interventions. ? ?5. Clinician as Investigator ? ?Clinicians who are in health care settings provide treatment; in a clinical trial setting, they are also investigating safety and efficacy of an intervention. In otherwise your doctor or nurse practitioner can be wearing 2 hats - one as care giver another as Company secretary ? ?6. Patient as Visual merchandiser Subject ? ?Patients participating in research trials are research subjects or volunteers. In other words participating in research is 100% voluntary and at one's own free weill. The decision to participate or not participate will NOT affect patient care and the doctor-patient relationship in any way ? ?7. Insurance ? ?[place] NCD she has right to  participate in trials ? ?8. [place] ? ? - explained trial compensation ? ?She concluded she will do the study ? ? ? ?SIGNATURE  ? ? ?Dr. Brand Males, M.D., F.C.C.P, ACRP-CPI ?Pulmonary and Critical Care Medicine ?Company secretary, [facility] ?Staff Physician, [place] ?Center Director - Interstitial Lung Disease  Program  ?[facility] [place] ?[place], [place], [address] ? ? ?Pager: [phone], If no answer  OR between  19:00-7:00h: page [phone]  [phone]  [phone] ?Telephone (research): [phone] ? ?4:03 PM ?04/04/2022 ? ? ?4:03 PM ?04/04/2022 ? ? ?

## 2022-04-05 ENCOUNTER — Other Ambulatory Visit: Payer: Self-pay | Admitting: Internal Medicine

## 2022-04-05 DIAGNOSIS — J84112 Idiopathic pulmonary fibrosis: Secondary | ICD-10-CM

## 2022-04-05 DIAGNOSIS — Z006 Encounter for examination for normal comparison and control in clinical research program: Secondary | ICD-10-CM

## 2022-04-10 ENCOUNTER — Ambulatory Visit (INDEPENDENT_AMBULATORY_CARE_PROVIDER_SITE_OTHER): Payer: Medicare Other

## 2022-04-10 DIAGNOSIS — I495 Sick sinus syndrome: Secondary | ICD-10-CM

## 2022-04-11 LAB — CUP PACEART REMOTE DEVICE CHECK
Battery Remaining Longevity: 37 mo
Battery Voltage: 2.96 V
Brady Statistic AP VP Percent: 0.07 %
Brady Statistic AP VS Percent: 89.27 %
Brady Statistic AS VP Percent: 0.44 %
Brady Statistic AS VS Percent: 10.22 %
Brady Statistic RA Percent Paced: 88.48 %
Brady Statistic RV Percent Paced: 0.49 %
Date Time Interrogation Session: 20230511091509
Implantable Lead Implant Date: 20151103
Implantable Lead Implant Date: 20151103
Implantable Lead Location: 753859
Implantable Lead Location: 753860
Implantable Lead Model: 5076
Implantable Lead Model: 5076
Implantable Pulse Generator Implant Date: 20151103
Lead Channel Impedance Value: 437 Ohm
Lead Channel Impedance Value: 475 Ohm
Lead Channel Impedance Value: 494 Ohm
Lead Channel Impedance Value: 513 Ohm
Lead Channel Pacing Threshold Amplitude: 0.5 V
Lead Channel Pacing Threshold Amplitude: 0.5 V
Lead Channel Pacing Threshold Pulse Width: 0.4 ms
Lead Channel Pacing Threshold Pulse Width: 0.4 ms
Lead Channel Sensing Intrinsic Amplitude: 4.875 mV
Lead Channel Sensing Intrinsic Amplitude: 4.875 mV
Lead Channel Sensing Intrinsic Amplitude: 7.25 mV
Lead Channel Sensing Intrinsic Amplitude: 7.25 mV
Lead Channel Setting Pacing Amplitude: 1.5 V
Lead Channel Setting Pacing Amplitude: 2 V
Lead Channel Setting Pacing Pulse Width: 0.4 ms
Lead Channel Setting Sensing Sensitivity: 2.8 mV

## 2022-04-15 NOTE — Progress Notes (Deleted)
Cardiology Office Note Date:  04/15/2022  Patient ID:  [name] [name], [name] [name] 09/16/1941, MRN [id number] PCP:  [name], MD  Cardiologist:  Dr. Sallyanne Kuster Electrophysiologist: Dr. Curt Bears  ***refresh   Chief Complaint: *** 72moTikosyn  History of Present Illness: [name] PAOLELLAis a 81y.o. female with history of chronic CHF (diastolic), IPF, stroke, tachy-brady w/PPM, MVP, OSA, obesity, HTN, AFib  She comes in today to be seen for Dr. CCurt Bears now s/p Tikosyn initiation, discharged 03/08/22, she presented in Apaced rhythm, maintained through her admission. She saw the AFib clinic as usual, she was still in an Apaced rhythm and mentions a bit disappointed, not feeling much better in SR.  QT stable, planned for labs  *** symptoms *** burden *** Tikosyn EKG, labs *** med list *** teaching *** eliquis, bleeding, dose *** volume  Device information MDT dual chamber PPM implanted 10/04/2014   Past Medical History:  Diagnosis Date   Alpha-1-antitrypsin deficiency carrier    Arthritis    "all over"   Atherosclerosis of aorta (HCC)    Atrial fibrillation ([place]    Back pain    Complication of anesthesia    "I woke up during 2 different procedures" (170/05/2375   Diastolic dysfunction    Frequent UTI    GERD (gastroesophageal reflux disease)    Hypertension    Hypertriglyceridemia    Insulin resistance    Long term current use of anticoagulant    Multiple lung nodules    Muscular deconditioning    MVP (mitral valve prolapse)    Obesity    OSA on CPAP    Osteoarthritis    Osteopenia    unspecified location   Pacemaker    PAF (paroxysmal atrial fibrillation) (HCC)    Positive ANA (antinuclear antibody)    Presbyesophagus    Presence of permanent cardiac pacemaker    Primary osteoarthritis of both hands    neck and spine and hips   Pulmonary fibrosis ([place]    followed by pulmonolgy   PVC's (premature ventricular contractions)    Shortness of breath    Sinus arrest  10/2014   s/p Medtronic Advisa model A2DR01 serial number [id number]   Sleep apnea    Stroke ([place] 01/2014   denies deficits on 10/04/2014   Tachy-brady syndrome ([place] 10/2014   TIA (transient ischemic attack)    Vitamin D deficiency     Past Surgical History:  Procedure Laterality Date   CARDIOVERSION N/A 09/02/2017   Procedure: CARDIOVERSION;  Surgeon: CSanda Klein MD;  [facility]  Service: Cardiovascular;  Laterality: N/A;   COLONOSCOPY  2019   ESOPHAGEAL DILATION     EXCISION VAGINAL CYST     benign nodules   INSERT / REPLACE / REMOVE PACEMAKER  10/04/2014   Medtronic Advisa model A2DR01 serial number [id number]   LOOP RECORDER EXPLANT N/A 10/04/2014   Procedure: LOOP RECORDER EXPLANT;  Surgeon: MSanda Klein MD;  [facility];  Service: Cardiovascular;  Laterality: N/A;   LOOP RECORDER IMPLANT N/A 04/19/2014   Procedure: LOOP RECORDER IMPLANT;  Surgeon: MSanda Klein MD;  [facility];  Service: Cardiovascular;  Laterality: N/A;   NM MYOCAR PERF WALL MOTION  12/18/2007   normal   PERMANENT PACEMAKER INSERTION N/A 10/04/2014   Procedure: PERMANENT PACEMAKER INSERTION;  Surgeon: MSanda Klein MD;  [facility];  Service: Cardiovascular;  Laterality: N/A;   TUBAL LIGATION     [place]  05/15/2010   LA mildly  dilated,mild mitral annular ca+, AOV mildly sclerotic, mild asymmetric LVH    Current Outpatient Medications  Medication Sig Dispense Refill   Acetaminophen (TYLENOL ARTHRITIS PAIN PO) Take 2 tablets by mouth daily as needed (For pain).     apixaban (ELIQUIS) 5 MG TABS tablet Take 1 tablet (5 mg total) by mouth 2 (two) times daily. 180 tablet 1   B Complex-C (SUPER B COMPLEX PO) Take 1 tablet by mouth daily.     Biotin 5000 MCG CAPS Take 5,000 mcg by mouth daily.     Calcium Carbonate-Vit D-Min (CALCIUM 1200 PO) Take 1,200 mg by mouth daily.      cholecalciferol (VITAMIN D) 1000 UNITS tablet Take 6,000 Units by mouth  daily.      Co-Enzyme Q10 100 MG CAPS Take 100 mg by mouth daily.      diazepam (VALIUM) 5 MG tablet TAKE 1 TABLET EACH DAY. 30 tablet 0   dofetilide (TIKOSYN) 500 MCG capsule Take 1 capsule (500 mcg total) by mouth 2 (two) times daily. 180 capsule 2   fluticasone (FLONASE) 50 MCG/ACT nasal spray Place 2 sprays into both nostrils daily as needed for allergies or rhinitis.     folic acid (FOLVITE) 536 MCG tablet Take 400 mcg by mouth daily.     glucosamine-chondroitin 500-400 MG tablet Take 1 tablet by mouth 2 (two) times daily.      loratadine (CLARITIN) 10 MG tablet Take 10 mg by mouth daily.     Magnesium 400 MG CAPS Take 400 mg by mouth daily. 30 capsule 0   metoprolol tartrate (LOPRESSOR) 25 MG tablet Take 1 tablet (25 mg total) by mouth 2 (two) times daily. 180 tablet 3   Multiple Vitamins-Minerals (ZINC PO) Take 1 tablet by mouth daily.     Nintedanib (OFEV) 100 MG CAPS Take 1 capsule (100 mg total) by mouth 2 (two) times daily. 180 capsule 3   nitroGLYCERIN (NITROSTAT) 0.4 MG SL tablet Place 1 tablet (0.4 mg total) under the tongue every 5 (five) minutes as needed for chest pain. 25 tablet 1   omeprazole (PRILOSEC) 40 MG capsule Take 1 capsule by mouth daily.     sertraline (ZOLOFT) 100 MG tablet TAKE 1 TABLET EACH DAY. 90 tablet 0   No current facility-administered medications for this visit.    Allergies:   Cefazolin, Pseudoephedrine hcl, Ergotamine, Other, Pentobarbital, Pirfenidone, and Caffeine   Social History:  The patient  reports that she quit smoking about 51 years ago. Her smoking use included cigarettes. She has a 36.00 pack-year smoking history. She has never used smokeless tobacco. She reports current alcohol use. She reports that she does not use drugs.   Family History:  The patient's family history includes Alcoholism in her brother; Alpha-1 antitrypsin deficiency in her daughter; Arthritis in her son; Breast cancer in her daughter; Diabetes in her son; Healthy in her  brother, daughter, and sister; Heart attack in her mother; Heart failure in her father; Prostate cancer in her brother, brother, and brother; Stroke in her brother, brother, and father; Sudden death in her mother.  ROS:  Please see the history of present illness.    All other systems are reviewed and otherwise negative.   PHYSICAL EXAM:  VS:  LMP  (LMP Unknown)  BMI: There is no height or weight on file to calculate BMI. Well nourished, well developed, in no acute distress HEENT: normocephalic, atraumatic Neck: no JVD, carotid bruits or masses Cardiac:  *** RRR; no significant murmurs, no  rubs, or gallops Lungs:  *** CTA b/l, no wheezing, rhonchi or rales Abd: soft, nontender MS: no deformity or *** atrophy Ext: *** no edema Skin: warm and dry, no rash Neuro:  No gross deficits appreciated Psych: euthymic mood, full affect  *** PPM site is stable, no tethering or discomfort   EKG:  Done today and reviewed by myself shows  ***  Device interrogation done today and reviewed by myself:  ***   05/18/2019: TTE 1. The left ventricle has low normal systolic function, with an ejection  fraction of 50-55%. The cavity size was normal. Left ventricular diastolic  function could not be evaluated.   2. The right ventricle has normal systolic function. The cavity was  normal.   3. The mitral valve is grossly normal.   4. The tricuspid valve is grossly normal.   5. The aortic valve is tricuspid. Mild thickening of the aortic valve. No  stenosis of the aortic valve.   6. Normal LV systolic function; no significant valvular heart disease.    12/27/2016: stress myoview The study is normal. This is a low risk study. There was no ST segment deviation noted during stress. No T wave inversion was noted during stress.   Low risk stress nuclear study with normal perfusion.  The study was not gated.  Recent Labs: 04/17/2021: Pro B Natriuretic peptide (BNP) 289.0 12/15/2021: ALT 23; B  Natriuretic Peptide 309.1; Hemoglobin 15.6; Platelets 286 03/15/2022: BUN 18; Creatinine, Ser 1.05; Potassium 4.1; Sodium 136 03/28/2022: Magnesium 2.1  No results found for requested labs within last 8760 hours.   CrCl cannot be calculated (Patient's most recent lab result is older than the maximum 21 days allowed.).   Wt Readings from Last 3 Encounters:  03/15/22 273 lb 12.8 oz (124.2 kg)  03/06/22 276 lb 0.3 oz (125.2 kg)  03/05/22 276 lb 3.2 oz (125.3 kg)     Other studies reviewed: Additional studies/records reviewed today include: summarized above  ASSESSMENT AND PLAN:  Paroxysmal Afib CHA2DS2Vasc is 7, on Eliquis, *** apprpriately dosed *** burden *** Tikosyn,   HTN ***  Chronic CHF (diastolic) ***   Disposition: F/u with ***  Current medicines are reviewed at length with the patient today.  The patient did not have any concerns regarding medicines.  Venetia Night, PA-C 04/15/2022 5:34 PM     [place] [address] [phone] (office)  [phone] (fax)

## 2022-04-16 ENCOUNTER — Encounter: Payer: Medicare Other | Admitting: Physician Assistant

## 2022-04-17 DIAGNOSIS — R300 Dysuria: Secondary | ICD-10-CM | POA: Diagnosis not present

## 2022-04-17 DIAGNOSIS — N390 Urinary tract infection, site not specified: Secondary | ICD-10-CM | POA: Diagnosis not present

## 2022-04-19 DIAGNOSIS — L84 Corns and callosities: Secondary | ICD-10-CM | POA: Diagnosis not present

## 2022-04-19 DIAGNOSIS — Q6689 Other  specified congenital deformities of feet: Secondary | ICD-10-CM | POA: Diagnosis not present

## 2022-04-19 NOTE — Progress Notes (Signed)
Remote pacemaker transmission.   

## 2022-04-24 DIAGNOSIS — M6281 Muscle weakness (generalized): Secondary | ICD-10-CM | POA: Diagnosis not present

## 2022-04-24 DIAGNOSIS — R278 Other lack of coordination: Secondary | ICD-10-CM | POA: Diagnosis not present

## 2022-05-02 ENCOUNTER — Telehealth: Payer: Self-pay | Admitting: Internal Medicine

## 2022-05-02 ENCOUNTER — Inpatient Hospital Stay: Admission: RE | Admit: 2022-05-02 | Payer: Medicare Other | Source: Ambulatory Visit

## 2022-05-02 NOTE — Telephone Encounter (Signed)
Called and spoke with patient she has been scheduled for OV on Monday with Katie. Advised if she got worse to go to ED for further eval. Nothing further needed at this time.

## 2022-05-02 NOTE — Telephone Encounter (Signed)
Unclear what is going on. Is it is a cPAP issue? Is it because she is sick?  Plan  - if horrible go to ER  - give first avail APP appt  - if feeling sick then can call in abx/pred     SIGNATURE    Dr. Brand Males, M.D., F.C.C.P,  Pulmonary and Critical Care Medicine Staff Physician, [place] Director - Interstitial Lung Disease  Program  Medical Director - [facility] [place] at [place], [place], [address]  NPI Number:  NPI #[id number] [facility] Number: [id number]  Pager: [phone], If no answer  -> Check AMION or Try [phone] Telephone (clinical office): [phone] Telephone (research): [phone]  5:25 PM 05/02/2022

## 2022-05-02 NOTE — Telephone Encounter (Signed)
Called and spoke to pt. Pt states she feels during the night she feels she isn't getting enough air. Pt states she is waking up during the night with the mask off her face and feels she is messing with it while sleeping and pulling it off. Pt states she has noticed an increase in ShOB and intermittent prod cough with clear mucus x 2 days. Pt states she feels the humidity is making things worse. Pt denies wheezing, CP/tightness and f/c/s. Pt last seen on 11/09/2021 for IPF. No available opening with APP until Monday. Pt has upcoming appt on 6/30 with MR.   Dr. Chase Caller, please advise. Thanks.

## 2022-05-06 ENCOUNTER — Ambulatory Visit: Payer: Medicare Other | Admitting: Nurse Practitioner

## 2022-05-10 ENCOUNTER — Ambulatory Visit
Admission: RE | Admit: 2022-05-10 | Discharge: 2022-05-10 | Disposition: A | Discharge status: Home / Self Care | Payer: Medicare Other | Source: Ambulatory Visit | Attending: Gastroenterology | Admitting: Gastroenterology

## 2022-05-10 DIAGNOSIS — K5730 Diverticulosis of large intestine without perforation or abscess without bleeding: Secondary | ICD-10-CM | POA: Diagnosis not present

## 2022-05-10 DIAGNOSIS — K625 Hemorrhage of anus and rectum: Secondary | ICD-10-CM

## 2022-05-10 DIAGNOSIS — I700 Atherosclerosis of aorta: Secondary | ICD-10-CM | POA: Diagnosis not present

## 2022-05-10 DIAGNOSIS — K6389 Other specified diseases of intestine: Secondary | ICD-10-CM | POA: Diagnosis not present

## 2022-05-13 ENCOUNTER — Telehealth: Payer: Self-pay

## 2022-05-13 NOTE — Telephone Encounter (Signed)
   Pre-operative Risk Assessment    Patient Name: [name] [name]  DOB: 1941-07-05 MRN: [id number]      Request for Surgical Clearance    Procedure:   Endoscopy  Date of Surgery:  Clearance TBD                                 Surgeon:  Dr. Therisa Doyne Surgeon's Group or Practice Name:  [facility] Phone number:  [phone] Fax number:  [phone]   Type of Clearance Requested:   - Pharmacy:  Hold Apixaban (Eliquis) 3   Type of Anesthesia:   Propofol   Additional requests/questions:    Signed, Wonda Horner   05/13/2022, 3:47 PM

## 2022-05-15 NOTE — Telephone Encounter (Signed)
Primary Cardiologist:Mihai Croitoru, MD  Chart reviewed as part of pre-operative protocol coverage. Because of [name] [name] past medical history and time since last visit, he/she will require a virtual visit/telephone call in order to better assess preoperative cardiovascular risk.  Pre-op covering staff: - Please contact patient, obtain consent, and schedule appointment   Per office protocol, patient can hold Eliquis for 1 day prior to procedure. She should resume as soon as safely possible after given elevated CV risk.   [name] Life, NP-C    05/15/2022, 4:45 PM [place] [address] Office [phone] Fax [phone]

## 2022-05-15 NOTE — Telephone Encounter (Signed)
Patient with diagnosis of Afib on Eliquis for anticoagulation.    Procedure: Endoscopy Date of procedure: TBD   CHA2DS2-VASc Score = 8  This indicates a 10.8% annual risk of stroke. The patient's score is based upon: CHF History: 1 HTN History: 1 Diabetes History: 0 Stroke History: 2 Vascular Disease History: 1 Age Score: 2 Gender Score: 1  CrCl 53.8 ml/min using AdjBW Platelet count 286  Per office protocol, patient can hold Eliquis for 1 day prior to procedure. She should resume as soon as safely possible after given elevated CV risk.

## 2022-05-17 ENCOUNTER — Telehealth: Payer: Self-pay | Admitting: *Deleted

## 2022-05-17 NOTE — Telephone Encounter (Signed)
Pt scheduled for a tele pre op appt 05/22/22 @ 11 am. Med rec and consent are done.     Patient Consent for Virtual Visit        [name] [name] has provided verbal consent on 05/17/2022 for a virtual visit (video or telephone).   CONSENT FOR VIRTUAL VISIT FOR:  [name] [name]  By participating in this virtual visit I agree to the following:  I hereby voluntarily request, consent and authorize [facility] and its employed or contracted physicians, physician assistants, nurse practitioners or other licensed health care professionals (the Practitioner), to provide me with telemedicine health care services (the "Services") as deemed necessary by the treating Practitioner. I acknowledge and consent to receive the Services by the Practitioner via telemedicine. I understand that the telemedicine visit will involve communicating with the Practitioner through live audiovisual communication technology and the disclosure of certain medical information by electronic transmission. I acknowledge that I have been given the opportunity to request an in-person assessment or other available alternative prior to the telemedicine visit and am voluntarily participating in the telemedicine visit.  I understand that I have the right to withhold or withdraw my consent to the use of telemedicine in the course of my care at any time, without affecting my right to future care or treatment, and that the Practitioner or I may terminate the telemedicine visit at any time. I understand that I have the right to inspect all information obtained and/or recorded in the course of the telemedicine visit and may receive copies of available information for a reasonable fee.  I understand that some of the potential risks of receiving the Services via telemedicine include:  Delay or interruption in medical evaluation due to technological equipment failure or disruption; Information transmitted may not be sufficient (e.g. poor resolution of  images) to allow for appropriate medical decision making by the Practitioner; and/or  In rare instances, security protocols could fail, causing a breach of personal health information.  Furthermore, I acknowledge that it is my responsibility to provide information about my medical history, conditions and care that is complete and accurate to the best of my ability. I acknowledge that Practitioner's advice, recommendations, and/or decision may be based on factors not within their control, such as incomplete or inaccurate data provided by me or distortions of diagnostic images or specimens that may result from electronic transmissions. I understand that the practice of medicine is not an exact science and that Practitioner makes no warranties or guarantees regarding treatment outcomes. I acknowledge that a copy of this consent can be made available to me via my patient portal ([facility]), or I can request a printed copy by calling the office of [facility].    I understand that my insurance will be billed for this visit.   I have read or had this consent read to me. I understand the contents of this consent, which adequately explains the benefits and risks of the Services being provided via telemedicine.  I have been provided ample opportunity to ask questions regarding this consent and the Services and have had my questions answered to my satisfaction. I give my informed consent for the services to be provided through the use of telemedicine in my medical care

## 2022-05-17 NOTE — Telephone Encounter (Signed)
Pt scheduled for a tele pre op appt 05/22/22 @ 11 am. Med rec and consent are done.

## 2022-05-20 ENCOUNTER — Telehealth: Payer: Self-pay | Admitting: Internal Medicine

## 2022-05-20 DIAGNOSIS — J84112 Idiopathic pulmonary fibrosis: Secondary | ICD-10-CM

## 2022-05-20 NOTE — Telephone Encounter (Signed)
Patient called the answering service on 05/17/2022 at 5:24pm regarding cat scan report- she requested a nurse give her a call back.  Please advise.

## 2022-05-21 NOTE — Telephone Encounter (Signed)
Her last CT scan of the chest was in June 2021.  All I see recently is a CT virtual colonoscopy done on May 10, 2022.  She has to talk to the ordering physician noted this.  However, I would advise her to do high-resolution CT chest supine and prone to track her ILD because it has been 2 years since she had a CT scan.  She also needs a pulmonary function test before she sees me [last 01 September 2021]  She has upcoming visit with me on May 31, 2022.  If the PFT cannot be done by then that is fine but she needs a CT        Latest Ref Rng & Units 09/26/2021    2:58 PM 05/07/2021    3:12 PM 08/10/2020   10:03 AM 07/19/2019    3:20 PM 02/24/2017   11:58 AM  PFT Results  FVC-Pre L 2.38  2.53  2.57  2.74  2.58   FVC-Predicted Pre % 79  84  84  93  85   FVC-Post L    2.63  2.45   FVC-Predicted Post %    89  81   Pre FEV1/FVC % % 87  86  86  86  82   Post FEV1/FCV % %    90  87   FEV1-Pre L 2.06  2.17  2.22  2.36  2.12   FEV1-Predicted Pre % 92  97  97  107  93   FEV1-Post L    2.35  2.14   DLCO uncorrected ml/min/mmHg 9.76  15.50  15.83  16.52  16.15   DLCO UNC% % 47  74  76  81  59   DLCO corrected ml/min/mmHg 9.48  15.50  15.34   14.93   DLCO COR %Predicted % 45  74  73   55   DLVA Predicted % 77  105  97  104  79   TLC L   4.50  3.97    TLC % Predicted %   81  74    RV % Predicted %   71  52         CLINICAL DATA:  Rectal bleeding   EXAM: CT VIRTUAL COLONOSCOPY DIAGNOSTIC   TECHNIQUE: The patient was given a standard bowel preparation with Gastrografin and barium for fluid and stool tagging respectively. The quality of the bowel preparation is fair. Automated CO2 insufflation of the colon was performed prior to image acquisition and colonic distention is poor. Image post processing was used to generate a 3D endoluminal fly-through projection of the colon and to electronically subtract stool/fluid as appropriate.   COMPARISON:  07/19/2008   FINDINGS: VIRTUAL  COLONOSCOPY   There is near complete opacification of the descending thoracic aorta and sigmoid colon on decubitus imaging as well as under distension of these segments of the colon. These areas are better distended and evaluated on supine imaging. Sigmoid and descending colonic diverticulosis. No fixed polypoid filling defects or annular constricting lesions.   Virtual colonoscopy is not designed to detect diminutive polyps (i.e., less than or equal to 5 mm), the presence or absence of which may not affect clinical management.   CT ABDOMEN AND PELVIS WITHOUT CONTRAST   Lower chest: No acute findings   Hepatobiliary: No suspicious focal hepatic abnormality. Gallbladder unremarkable.   Pancreas: No focal abnormality or ductal dilatation.   Spleen: No focal abnormality.  Normal size.   Adrenals/Urinary Tract: No adrenal abnormality.  No focal renal abnormality. No stones or hydronephrosis. Urinary bladder is unremarkable.   Stomach/Bowel: Stomach and small bowel decompressed, unremarkable.   Vascular/Lymphatic: Aortic atherosclerosis. No evidence of aneurysm or adenopathy.   Reproductive: Uterus and adnexa unremarkable.  No mass.   Other: No free fluid or free air.   Musculoskeletal: No acute bony abnormality.   IMPRESSION: Under distension of the descending colon and sigmoid colon on decubitus imaging, better distended and visualized on supine imaging. No visible fixed polypoid filling defects or annular constricting lesions.   Scattered left colonic diverticulosis.   Aortic atherosclerosis.     Electronically Signed   By: Rolm Baptise M.D.   On: 05/11/2022 20:05

## 2022-05-21 NOTE — Telephone Encounter (Signed)
Called patient and she states that she was wondering about her CAT scan results. I advised patient that I would message the doctor. She also states that at her scan she got sick and when she went home the pad she had on was like a sand color and she is concerned.   Please advise

## 2022-05-22 ENCOUNTER — Ambulatory Visit (INDEPENDENT_AMBULATORY_CARE_PROVIDER_SITE_OTHER): Payer: Medicare Other | Admitting: Physician Assistant

## 2022-05-22 DIAGNOSIS — Z7901 Long term (current) use of anticoagulants: Secondary | ICD-10-CM

## 2022-05-22 DIAGNOSIS — Z01810 Encounter for preprocedural cardiovascular examination: Secondary | ICD-10-CM | POA: Diagnosis not present

## 2022-05-22 NOTE — Telephone Encounter (Signed)
Called and spoke with pt letting her know the info per MR and she verbalized understanding. Stated to her that we will place order for CT chest to hopefully get this done prior to her upcoming appt with [place] on 6/30.  Order for HRCT has been placed. Nothing further needed.

## 2022-05-22 NOTE — Progress Notes (Signed)
Virtual Visit via Telephone Note   Because of [name] [name]'s co-morbid illnesses, she is at least at moderate risk for complications without adequate follow up.  This format is felt to be most appropriate for this patient at this time.  The patient did not have access to video technology/had technical difficulties with video requiring transitioning to audio format only (telephone).  All issues noted in this document were discussed and addressed.  No physical exam could be performed with this format.  Please refer to the patient's chart for her consent to telehealth for [facility].  Evaluation Performed:  Preoperative cardiovascular risk assessment _____________   Date:  05/22/2022   Patient ID:  [name] [name], DOB [birth year], MRN [id number] Patient Location:  Home Provider location:   Office  Primary Care Provider:  [name], MD Primary Cardiologist:  Sanda Klein, MD  Chief Complaint / Patient Profile   81 y.o. y/o female with a h/o SSS s/p PPM, HFpEF, MVP, OSA, IPF, CVA, aortic atherosclerosis, atrial fibrillation on chronic anticoagulation who is pending endoscopy and presents today for telephonic preoperative cardiovascular risk assessment.  Past Medical History    Past Medical History:  Diagnosis Date   Alpha-1-antitrypsin deficiency carrier    Arthritis    "all over"   Atherosclerosis of aorta (HCC)    Atrial fibrillation (HCC)    Back pain    Complication of anesthesia    "I woke up during 2 different procedures" (58/0/9983)   Diastolic dysfunction    Frequent UTI    GERD (gastroesophageal reflux disease)    Hypertension    Hypertriglyceridemia    Insulin resistance    Long term current use of anticoagulant    Multiple lung nodules    Muscular deconditioning    MVP (mitral valve prolapse)    Obesity    OSA on CPAP    Osteoarthritis    Osteopenia    unspecified location   Pacemaker    PAF (paroxysmal atrial fibrillation) (HCC)    Positive ANA  (antinuclear antibody)    Presbyesophagus    Presence of permanent cardiac pacemaker    Primary osteoarthritis of both hands    neck and spine and hips   Pulmonary fibrosis ([place])    followed by pulmonolgy   PVC's (premature ventricular contractions)    Shortness of breath    Sinus arrest 10/2014   s/p Medtronic Advisa model A2DR01 serial number [id number]   Sleep apnea    Stroke ([place]) 01/2014   denies deficits on 10/04/2014   Tachy-brady syndrome ([place]) 10/2014   TIA (transient ischemic attack)    Vitamin D deficiency    Past Surgical History:  Procedure Laterality Date   CARDIOVERSION N/A 09/02/2017   Procedure: CARDIOVERSION;  Surgeon: Sanda Klein, MD;  Location: [place];  Service: Cardiovascular;  Laterality: N/A;   COLONOSCOPY  2019   ESOPHAGEAL DILATION     EXCISION VAGINAL CYST     benign nodules   INSERT / REPLACE / REMOVE PACEMAKER  10/04/2014   Medtronic Advisa model A2DR01 serial number [id number]   LOOP RECORDER EXPLANT N/A 10/04/2014   Procedure: LOOP RECORDER EXPLANT;  Surgeon: Sanda Klein, MD;  Location: [place] CATH LAB;  Service: Cardiovascular;  Laterality: N/A;   LOOP RECORDER IMPLANT N/A 04/19/2014   Procedure: LOOP RECORDER IMPLANT;  Surgeon: Sanda Klein, MD;  Location: [place] CATH LAB;  Service: Cardiovascular;  Laterality: N/A;   NM MYOCAR PERF WALL MOTION  12/18/2007   normal   PERMANENT  PACEMAKER INSERTION N/A 10/04/2014   Procedure: PERMANENT PACEMAKER INSERTION;  Surgeon: Sanda Klein, MD;  Location: [place] CATH LAB;  Service: Cardiovascular;  Laterality: N/A;   TUBAL LIGATION     US ECHOCARDIOGRAPHY  05/15/2010   LA mildly dilated,mild mitral annular ca+, AOV mildly sclerotic, mild asymmetric LVH    Allergies  Allergies  Allergen Reactions   Cefazolin Hives and Itching    Other reaction(s): severe hives   Pseudoephedrine Hcl     Other reaction(s): palpitations (moderate)   Ergotamine     unknown   Other Other (See Comments)   Pentobarbital      unknown   Pirfenidone     unknown   Caffeine Palpitations    History of Present Illness    [name] is a 81 y.o. female who presents via audio/video conferencing for a telehealth visit today.  Pt was last seen in cardiology clinic on 03/15/22 by Roderic Palau NP.  The patient is now pending procedure as outlined above. Since her last visit, she reports abnormal fecal matter following a CT scan with her GI. She does not walk and is severely limited by her underlying lung issues. She rides a scooter chair and doesn't always take her oxygen with her resulting in severe fatigue when she gets back to her room at [facility]. She is unable to complete 4.0 METS.   Home Medications    Prior to Admission medications   Medication Sig Start Date End Date Taking? Authorizing Provider  Acetaminophen (TYLENOL ARTHRITIS PAIN PO) Take 2 tablets by mouth daily as needed (For pain).    [provider]  apixaban (ELIQUIS) 5 MG TABS tablet Take 1 tablet (5 mg total) by mouth 2 (two) times daily. 02/11/22   Croitoru, Mihai, MD  B Complex-C (SUPER B COMPLEX PO) Take 1 tablet by mouth daily.    [provider]  Biotin 5000 MCG CAPS Take 5,000 mcg by mouth daily.    [provider]  Calcium Carbonate-Vit D-Min (CALCIUM 1200 PO) Take 1,200 mg by mouth daily.     [provider]  cholecalciferol (VITAMIN D) 1000 UNITS tablet Take 6,000 Units by mouth daily.     [provider]  Co-Enzyme Q10 100 MG CAPS Take 100 mg by mouth daily.     [provider]  diazepam (VALIUM) 5 MG tablet TAKE 1 TABLET EACH DAY. 09/20/20   Virgie Dad, MD  dofetilide (TIKOSYN) 500 MCG capsule Take 1 capsule (500 mcg total) by mouth 2 (two) times daily. 03/15/22   Sherran Needs, NP  fluticasone Asencion Islam) 50 MCG/ACT nasal spray Place 2 sprays into both nostrils daily as needed for allergies or rhinitis.    [provider]  folic acid (FOLVITE) 371 MCG tablet Take 400 mcg  by mouth daily.    [provider]  glucosamine-chondroitin 500-400 MG tablet Take 1 tablet by mouth 2 (two) times daily.     [provider]  loratadine (CLARITIN) 10 MG tablet Take 10 mg by mouth daily.    [provider]  Magnesium 400 MG CAPS Take 400 mg by mouth daily. 03/15/22   Sherran Needs, NP  metoprolol tartrate (LOPRESSOR) 25 MG tablet Take 1 tablet (25 mg total) by mouth 2 (two) times daily. 10/22/21 05/17/22  Croitoru, Mihai, MD  Multiple Vitamins-Minerals (ZINC PO) Take 1 tablet by mouth daily.    [provider]  Nintedanib (OFEV) 100 MG CAPS Take 1 capsule (100 mg total)  by mouth 2 (two) times daily. 12/10/21   Brand Males, MD  nitroGLYCERIN (NITROSTAT) 0.4 MG SL tablet Place 1 tablet (0.4 mg total) under the tongue every 5 (five) minutes as needed for chest pain. 09/19/20   Croitoru, Mihai, MD  omeprazole (PRILOSEC) 40 MG capsule Take 1 capsule by mouth daily. 02/02/21   [provider]  sertraline (ZOLOFT) 100 MG tablet TAKE 1 TABLET EACH DAY. 02/26/21   Virgie Dad, MD    Physical Exam    Vital Signs:  CARINNE BRANDENBURGER does not have vital signs available for review today.  Given telephonic nature of communication, physical exam is limited. AAOx3. NAD. Normal affect.  Speech and respirations are unlabored.  Accessory Clinical Findings    None  Assessment & Plan    1.  Preoperative Cardiovascular Risk Assessment:  While she does not have a history of ischemic heart disease, she is very sedentary due to underlying pulmonary issues. She is moderate to high risk to undergo sedation. She is not sure she is willing to accept this risk and would like to speak with Dr. Sallyanne Kuster about her options.  She has an appt with Dr. Sallyanne Kuster 06/10/22 - she will discuss preoperative risk evaluation at that appt.    2. Anticoagulation hold Patient with diagnosis of Afib on Eliquis for anticoagulation.     Procedure: Endoscopy Date of  procedure: TBD     CHA2DS2-VASc Score = 8  This indicates a 10.8% annual risk of stroke. The patient's score is based upon: CHF History: 1 HTN History: 1 Diabetes History: 0 Stroke History: 2 Vascular Disease History: 1 Age Score: 2 Gender Score: 1   CrCl 53.8 ml/min using AdjBW Platelet count 286   Per office protocol, patient can hold Eliquis for 1 day prior to procedure. She should resume as soon as safely possible after given elevated CV risk.   A copy of this note will be routed to requesting surgeon.  Time:   Today, I have spent 10 minutes with the patient with telehealth technology discussing medical history, symptoms, and management plan.     Tami Lin Kaemon Barnett, PA  05/22/2022, 11:28 AM

## 2022-05-28 ENCOUNTER — Ambulatory Visit (HOSPITAL_COMMUNITY)
Admission: RE | Admit: 2022-05-28 | Discharge: 2022-05-28 | Disposition: A | Discharge status: Home / Self Care | Payer: Medicare Other | Source: Ambulatory Visit | Attending: Internal Medicine | Admitting: Internal Medicine

## 2022-05-28 DIAGNOSIS — J479 Bronchiectasis, uncomplicated: Secondary | ICD-10-CM | POA: Diagnosis not present

## 2022-05-28 DIAGNOSIS — I700 Atherosclerosis of aorta: Secondary | ICD-10-CM | POA: Diagnosis not present

## 2022-05-28 DIAGNOSIS — R918 Other nonspecific abnormal finding of lung field: Secondary | ICD-10-CM | POA: Diagnosis not present

## 2022-05-28 DIAGNOSIS — J84112 Idiopathic pulmonary fibrosis: Secondary | ICD-10-CM | POA: Diagnosis not present

## 2022-05-31 ENCOUNTER — Ambulatory Visit (INDEPENDENT_AMBULATORY_CARE_PROVIDER_SITE_OTHER): Payer: Medicare Other | Admitting: Internal Medicine

## 2022-05-31 ENCOUNTER — Encounter: Payer: Self-pay | Admitting: Internal Medicine

## 2022-05-31 VITALS — BP 130/78 | HR 91 | Ht 67.0 in | Wt 276.6 lb

## 2022-05-31 DIAGNOSIS — R0609 Other forms of dyspnea: Secondary | ICD-10-CM | POA: Diagnosis not present

## 2022-05-31 DIAGNOSIS — J84112 Idiopathic pulmonary fibrosis: Secondary | ICD-10-CM | POA: Diagnosis not present

## 2022-05-31 DIAGNOSIS — Z006 Encounter for examination for normal comparison and control in clinical research program: Secondary | ICD-10-CM

## 2022-05-31 DIAGNOSIS — K625 Hemorrhage of anus and rectum: Secondary | ICD-10-CM

## 2022-05-31 NOTE — Patient Instructions (Addendum)
IPF (idiopathic pulmonary fibrosis) ([place]) Medication monitoring encounter  - glad  overall you are toleratnig rechallenge of ofev 177m twice daily (lower dose regimen only for you) since 10/06/21 - symptoms same x 6 months but worse x 2 year; CT worse over 2 years   Plan  -  - continue ofev 1056mtwice daily - check LFT, CBC, BMET. BNP 05/31/2022 - check spirometry/dlco in 3 months - consider echo based on results  Rectal bleeding  - uncler if thre Is ongoing rectal bleeding since last visit; last hgb check normal JAn 2023 - virtual colonoscopy seemed normal but for dveritculosis' (per report)  Plan  - refer  [facility] ( wants to swtch from Dr KaAcie FredricksonI doc to [facility])    Followup - 3 months do spirometry and dlco - return for 30 min visith wth DR RaChase Callern 2-3 months but after PFT  - walk test and symptoms questionnaire at followup

## 2022-05-31 NOTE — Progress Notes (Signed)
_0  ID: [name] [name], female    DOB: 05/01/1941, 81 y.o.   MRN: [id number]  Chief Complaint  Patient presents with   Follow-up    Pt. Says she's had some bleeding.     Referring provider: [name], MD  HPI: 81 year old female former smoker quit in 1971.  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 Patient of Dr. Chase Caller, last seen by pulmonary NP on 04/10/21.   Previous [facility] pulmonary encounter: 04/10/2021 follow-up; Bronchitis, Pulmonary fibrosis Patient returns for a follow-up visit, patient was seen last week for cough, congestion, shortness of breath and chills.  COVID-19 testing was negative.  Patient had been fully vaccinated for COVID-19.  She had no increased oxygen demands.  Patient was given doxycycline for 7 days and prednisone 20 mg for 5 days. Patient complains ongoing cough and congestion , mucus remains green on/off. No fever.  Eating well , no n/v.d. No hemoptysis , orthopnea or edema.  Called in over the weekend and was called in steroid burst. Currently on Prednisone 22m daily .  Chest xray showed chronic changes with no acute process.  [place]   04/17/2021 Patient presents today follow-up with PFTs. She did not have PFTs done today because she is still not feeling well. She completed course of doxycycline and has two days left of prednisone. Reports "cold symptoms" described as sinus drainage and cough. Sputum has not real purulence. Energy level is low. States that she feels her head is not right. She is using Flonase ans Claritin. She is short of breath. She completed doxycycline twice with no improvement. She is taking mucinex twice a day. She has not used albuterol rescue inhaler. Her ex husband died two weeks ago, she found out two days ago. Denies depressed feelings.    05/15/2021 -  Dr. RChase CallerType of  visit: Telephone/Video [name]81y.o. - changed face to face visit to telephone visit due to > 47F heat. Wantd to know PFT resuts June 2022 copared to sept 2021. FVC down 1.6%, DLCO down 2.1% (compared to y  2years ago FVC declined 7.7%). On Esbeit since Dec 2021/Jan 2022.  Says Dr C sarted on her ditliazem 04/18/21 and after that esbriet became more intolerable. She had constipationa and retain fluids.  So, she stopped all her medications except  eliquis and anti depressant and anti histamine. This means she stopped esbriet approx a week ago. Says that for several days still not feel better though better today. She is unsure if today is a good day v bad day. But overall feels A Fib is acting up. She wants to restarrtrt esbriet after good w wash out.   She is now using o2 at night but feels is not helping energy levels at day time. I Advised she could return it and then she said is actually helping her and does not want to return it.    08/14/21- Dr. RChase CallerChief Complaint  Patient presents with   Follow-up    Pt states she has been doing okay since last visit. Still becomes SOB with exertion.     [name]81y.o. -returns for follow-up.  Since her last visit because she was having calls with side effects from pirfenidone.  She stopped it.  She says after that she started feeling better.  Despite this she is  reporting progressive decline in dyspnea.  Dyspnea score is listed below.  She uses oxygen with exertion as needed.  I at night she uses CPAP without oxygen.  She is frustrated with pirfenidone.  She asked about the alternative nintedanib.  She recollects that there constraints with it in the setting of heart disease and anticoagulation.  Explained that one of the study showed 0.5% increase incidence and MI with nintedanib.  But not the other studies.  Explained that surgical mechanism of anticoagulation but clinically patient have tolerated.  Explained about GI side effects  particularly diarrhea.  She is nervous and hesitant but finally we took a shared decision making that she would start it at a lower dose given the fact it can be beneficial in reducing risk of progression.  We also discussed various aspects of clinical trials documented below.  She is interested in this as long as it is a medication with a much better side effect profile.  But at this point in time we took a shared decision making to go with standard of care therapy first.  She will have the flu shot today.  She is very nervous about getting mRNA COVID booster because of the side effect profile.  She had significant side effects apparently.  She also had omicron COVID in January 2022 and she is wondering about natural immunity.  However she is aware is COVID everywhere currently.  We took a shared decision making to check IgG antibody levels and adjust the risk threshold accordingly.  09/18/2021- Interim hx  Patient presents today for acute visit for medication reaction to OFEV. Patient called our office yesterday with reports of rectal bleeding and abdominal pain since starting 09/17/21. She is newly on OFEV since 10/13 and is also on Eliquis. Our office advised her to stop OFEV. Rectal bleeding has since resolved, she still has some slight lower abdomen discomfort described as "bloating".  Breathing wise she is stable. She uses POC 2L with exertion as needed.  She is on oxygen at night with her CPAP. Denies f/c/s, diarrhea, nausea or vomitting.     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  Small pulmonary nodules measuring 5 mm or less stable dating back to 2018 consistent with a benign etiology.  OV 09/26/2021  Subjective:  [name], female , DOB: 1941-08-27 , age 81 y.o. , MRN: [id number] , ADDRESS: [address] [place] [address] PCP [name], MD Patient Care Team: [name], MD as  PCP - General (Family Medicine) Sanda Klein, MD as PCP - Cardiology (Cardiology) Maisie Fus, MD as Consulting Physician (Obstetrics and Gynecology) Richmond Campbell, MD as Consulting Physician (Gastroenterology) Croitoru, Dani Gobble, MD as Consulting Physician (Cardiology) Kathie Rhodes, MD (Inactive) as Consulting Physician (Urology) Danella Sensing, MD as Consulting Physician (Dermatology) Brand Males, MD as Consulting Physician (Pulmonary Disease) Bo Merino, MD as Consulting Physician (Rheumatology) Barbaraann Cao, OD as Referring Physician (Optometry) Berenice Primas, MD as Referring Physician (Orthopedic Surgery)  This Provider for this visit: Treatment Team:  Attending Provider: Brand Males, MD    09/26/2021 -   Chief Complaint  Patient presents with   Follow-up    PFT performed today.  Pt states she has been doing okay since last visit.   Follow-up IPF   - sept 09, 2021 update - IPF dx givien: age greater than 4, only trace positive ANAdescription of small hiatal hernia on CT  scan, the type of pattern on the CT scan called probable UIP, autoimmune antibodies negative last year    - last CT July 2021   -last PFT sept 201  - portable o2 since sept 2021  -esbriet start end sept 2021 -> stopped 11/02/20 due to fatigue -> rstar 11/09/2020 -. Max dose 2 pilll tid since Dec 02, 2020 -> intermittent start/stop summer 2022 -> stopped aug 2022  - Overnight pulse oximetry done on 03/12/2021 shows time spent less than equal to 88% is 40.3 minutes.  Average pulse was 60/min.  Time spent in bradycardia 72.5%. Start 2 L nasal cannula at night  Multiple Lung Nodules 69m- stable 202-10-2017-> July 2021 Alpha-1 MZ but does not have emphysema on CT 202-11-21  [Daughter died from alpha-1 CC] Obesity  Diastolic dysfunction Atrial fibrillation on Eliquis Obstructive sleep apnea on CPAP without oxygen COVID-19 incidental 12/28/2020 - likely Omicron4  HPI [name] LAMERE89y.o.  -presents ith daughter ? SJeralyn Ruthsmeeting first time. Doing stable from symptom stand point resp wise but PFT down significantly. Took ofev for 5 days and there are aspectso of taking ofev (1023mbid - the lower dose) tht she liked. She felt her urinary stream had better flow (not a known side effect). She had lower appetite (known Se with ofev) but she liked the curb on appetie it due to her obesity. The 5d into she had had mild discomfort in suprapubic area (known SE for abd discmofort) and had 1 drop of painless rectal bleed. So stopped ofev (also on eliquisl bleeding theoertical risk with ofev). After stopping ofev, urinary flow is back to baseline(does not like it), and no mpre rectal bleeding . She never had rectal bleeding before and afer. She again stated and made clear the so called bleeding was "1 drop of blood ont he floor". Associted with ofev intake on background of eliquis and associted with suprapubic discomfor. No dysuria. Dr MaBrien Matess her GI. Last colonoscopy was when she ws 7562nd was normal apparently  Discussed  - seeing a GI doc for rectal bleed - she is agreeable   - discussed rechallenge with ofev v joining clinical trial phase 3( to be volunteer + exploit therapeutic advantage with 50% chance of placebo) Discussed other asoects of trials. Explained the 091995/02/[id number] study with IV MAB against CTGF Pamrevulumab v placeo.  See below. She and dauhger want her to do clinical trial but are more enthused about the idea of a ofev rechallenge first. We discussed that rechallenge liketly to cause side effects but anecdotally seems some patients who do not have issues. She is not interested in esbriet rechallenge       OV 10/10/2021  Subjective:  [name] , DOB: 11May 19, 1942 age 81.o. , MRN: [id number] [address] MiKathyrn LassMD Patient Care Team: MiKathyrn LassMD as PCP - General (Family  Medicine) CrSanda KleinMD as PCP - Cardiology (Cardiology) NeMaisie FusMD as Consulting Physician (Obstetrics and Gynecology) MeRichmond CampbellMD as Consulting Physician (Gastroenterology) Croitoru, MiDani GobbleMD as Consulting Physician (Cardiology) OtKathie RhodesMD (Inactive) as Consulting Physician (Urology) JoDanella SensingMD as Consulting Physician (Dermatology) RaBrand MalesMD as Consulting Physician (Pulmonary Disease) DeBo MerinoMD as Consulting Physician (Rheumatology) MiBarbaraann CaoOD as Referring Physician (Optometry) GrBerenice PrimasMD as Referring Physician (Orthopedic Surgery)  This Provider for this visit: Treatment Team:  Attending Provider:  Brand Males, MD    10/10/2021 -   Chief Complaint  Patient presents with   Follow-up    Patient states that  she is doing good with the Ofev and is currently not having any side effects. States that she has been coughing a lot lately. Wants to know if she is due for pneumonia vaccine.   Type of visit: Telephone/Video Circumstance: COVID-19 national emergency Identification of patient [name] [name] with November 19, 1941 and MRN [id number] - 2 person identifier Risks: Risks, benefits, limitations of telephone visit explained. Patient understood and verbalized agreement to proceed Anyone else on call: just patient Patient location: her home This provider location: [address], [place], [place] [address]   HPI PRANAVI AURE 81 y.o. -at this point telephone visit is to see how rechallenge with nintedanib is ongoing.  Since 10/06/2021 Saturday she is on 100 mg twice daily.  She says she has not had any recurrence of bleeding.  No GI symptoms.  Tolerating it well.  Dyspnea is not any worse.  No new issues.  Noticed mild reduction in GFR in October 2022.  Liver function test and hemoglobin are fine.             OV 11/09/2021  Subjective:  [name], female , DOB: October 26, 1941 , age 66 y.o.  , MRN: [id number] , ADDRESS: [address] [place] [address] PCP [name], MD Patient Care Team: [name], MD as PCP - General (Family Medicine) Croitoru, Dani Gobble, MD as PCP - Cardiology (Cardiology) Maisie Fus, MD as Consulting Physician (Obstetrics and Gynecology) Richmond Campbell, MD as Consulting Physician (Gastroenterology) Croitoru, Dani Gobble, MD as Consulting Physician (Cardiology) Kathie Rhodes, MD (Inactive) as Consulting Physician (Urology) Danella Sensing, MD as Consulting Physician (Dermatology) Brand Males, MD as Consulting Physician (Pulmonary Disease) Bo Merino, MD as Consulting Physician (Rheumatology) Barbaraann Cao, OD as Referring Physician (Optometry) Berenice Primas, MD as Referring Physician (Orthopedic Surgery)  This Provider for this visit: Treatment Team:  Attending Provider: Brand Males, MD    11/09/2021 -   Chief Complaint  Patient presents with   Follow-up    Pt states she is about the same since last visit. Still becomes SOB with exertion.    HPI [name] [name] 81 y.o. -returns for follow-up.  She restarted nintedanib on 10/06/2021 at the lower dose.  She tells me that she has not had any rectal bleeding except yesterday there was again a few drops.  No abdominal pain.  Last visit I told her to go see GI doctor but she has not.  Her GI doctor was Dr. Cristina Gong in [place] but has since retired.  Advised that that I will make a referral.  In terms of shortness of breath she is stable.  She believes she is tolerating the nintedanib fine.  She is actually gained some weight.  She also reports that for the last 2 weeks she is on metoprolol because of cardiac rhythm issues.  At today blood pressure was 666 systolic.Marland Kitchen  She tells me this is high for her.  She has never had this problem before according to history.  But the observations only today.  She acknowledges that this could be whitecoat hypertension as well.  Last  CT scan of the chest July 2021 Last pulmonary function test October 2022  She is not fully compliant with the portable oxygen.     No results found.     OV 05/31/2022  Subjective:  [name], female , DOB: 04-06-1941 , age 80 y.o. , MRN: [id number] , ADDRESS: [address] [place] [address] PCP [name], MD Patient Care Team: [name], MD as PCP - General (Family Medicine) Croitoru, Dani Gobble, MD as PCP - Cardiology (Cardiology) Maisie Fus, MD as Consulting Physician (Obstetrics and Gynecology) Richmond Campbell, MD as Consulting Physician (Gastroenterology) Croitoru, Dani Gobble, MD as Consulting Physician (Cardiology) Kathie Rhodes, MD (Inactive) as Consulting Physician (Urology) Danella Sensing, MD as Consulting Physician (Dermatology) Brand Males, MD as Consulting Physician (Pulmonary Disease) Bo Merino, MD as Consulting Physician (Rheumatology) Barbaraann Cao, OD as Referring Physician (Optometry) Berenice Primas, MD as Referring Physician (Orthopedic Surgery)  This Provider for this visit: Treatment Team:  Attending Provider: Brand Males, MD   Follow-up IPF   - sept 09, 2021 update - IPF dx givien: age greater than 21, only trace positive ANAdescription of small hiatal hernia on CT scan, the type of pattern on the CT scan called probable UIP, autoimmune antibodies negative last year    - last CT July 2021   -last PFT October 2022  - portable o2 since sept 2021  -esbriet start end sept 2021 -> stopped 11/02/20 due to fatigue -> rstar 11/09/2020 -. Max dose 2 pilll tid since Dec 02, 2020 -> intermittent start/stop summer 2022 -> stopped aug 2022  - Overnight pulse oximetry done on 03/12/2021 shows time spent less than equal to 88% is 40.3 minutes.  Average pulse was 60/min.  Time spent in bradycardia 72.5%. Start 2 L nasal cannula at night  Multiple Lung Nodules 51m- stable 202-14-18-> July 2021 Alpha-1 MZ but  does not have emphysema on CT 214-Feb-2021  [Daughter died from alpha-1 CC] Obesity  Diastolic dysfunction Atrial fibrillation on Eliquis Obstructive sleep apnea on CPAP without oxygen COVID-19 incidental 12/28/2020 - likely Omicron4  05/31/2022 -   Chief Complaint  Patient presents with   Follow-up    Pt states she is still having problems with her breathing. Also states that she feels like she is more constipated than before and feels like she might have some internal bleeding.     HPI BNITHYA MERIWEATHER88y.o. -on finger for several months.  She believes IPF is slowly getting worse.  Review of the symptom score shows that symptoms are definitely worse in the last 2 years but compared to 6 months ago around the same.  Shared high-resolution CT chest that shows progression of ILD over 2 years.  I shared the results with her.  This fits in with the symptoms also getting worse over the last 2 years.  She is worried about the progression.  In the past we recommended she consider clinical trial as a care option but she declined.  This is still under consideration for her but she has not made up her mind.  Last echocardiogram was a few years ago.  Of note she has had rectal bleeding last year.  I referred her to Dr. KRoena Maladyand GI.  She had virtual colonoscopy.  She asked me to read the report.  It shows she has diverticulosis but does not seem anything hugely abnormal.  She is unsure if she still having rectal bleeding.  She thinks she might be.  She wants to switch GI doctors   She also tells me that since last visit she is on a new medicine for atrial fibrillation.  Tikosyn.  She wanted me to palpate a pulse.  I  did this for 30 seconds and told her at least for the 30 seconds she is in sinus rhythm.  SYMPTOM SCALE - ILD 07/21/2019   08/10/2020 262# 09/26/2020 Last Weight  Most recent update: 09/26/2020  1:42 PM      Weight  120.9 kg (266 lb 9.6 oz)                     11/09/2020 274# 01/23/2021 279#  08/14/2021 275# 09/18/2021 Started OFEV 10/13 but stoped 10/17 d/t GIB  09/26/2021 Off ofev. No more "rectal bleed" 11/09/2021 Back on ofev 152m bid since 11/5/2  280# 05/31/2022 276#  O2 use ra ra ra o2 with ex 02 with ex   O2 with exertoin     Shortness of Breath 0 -> 5 scale with 5 being worst (score 6 If unable to do)                At rest 0 0   5 0 4 0 0 0 0  Simple tasks - showers, clothes change, eating, shaving _0 3._1 Household (dishes, doing bed, laundry) _2 Shopping _3 4.5 4 4.5 4  Walking level at own pace _4 4.5 3  Walking up Stairs _5 Total (40 - 48) Dyspnea Score _6 23.5 22._7 How bad is your cough? 1 1-_8 How bad is your fatigue _9 3._10 nausea   0 0 0 0 0 0 0 0 0.25  vomit   0 0 0 0 0 0 0 0 0  diarrhea   0 0 0 1 0 0.5 0 00 Once in a while  axiety   2 1 0 0 0.5 0.5 0 0 0  depression   2 1._11 0.5-_12 Simple office walk 185 feet x  3 laps goal with forehead probe 07/21/2019   08/10/2020   01/23/2021   09/18/2021  05/31/2022   O2 used _13   Number laps completed 3 laps _14 Only 1 of 3 laps  Comments about pace modertae with walker Slow pace Sow pace Slow pace slow  Resting Pulse Ox/HR 97% and 78/min 100% and 74/min 94%and HR 92 96% RA and HR 80 98% and HR 64  Final Pulse Ox/HR 93% and 109/min 77% and 85/min 90 % and HR 100 90% and HR 125 97% and HR 105  Desaturated </= 88% _15   Desaturated <= 3% points Yes, 4 Yes, 13 points Yes, 4 piints Yes, 6 points no  Got Tachycardic >/= 90/min you have pulmonary fibrosis otherwise call interstitial lung disease yes no yes Yes yes  Symptoms at end of test dyspnea Mild dyspnea Dyspnea throughtou Dyspnea  Mod dspnea  Miscellaneous comments x Corrected with 2LNC and got    X  Very deconditioned      CT Chest data   CT Chest High Resolution  Result Date:  05/29/2022 CLINICAL DATA:  Interstitial lung disease, IPF EXAM: CT CHEST WITHOUT CONTRAST TECHNIQUE: Multidetector CT imaging  of the chest was performed following the standard protocol without intravenous contrast. High resolution imaging of the lungs, as well as inspiratory and expiratory imaging, was performed. RADIATION DOSE REDUCTION: This exam was performed according to the departmental dose-optimization program which includes automated exposure control, adjustment of the mA and/or kV according to patient size and/or use of iterative reconstruction technique. COMPARISON:  05/31/2020, 04/05/2019, 01/06/2017 FINDINGS: Cardiovascular: Aortic atherosclerosis. Left chest multi lead pacer. Normal heart size. No pericardial effusion. Mediastinum/Nodes: No enlarged mediastinal, hilar, or axillary lymph nodes. Somewhat patulous esophagus (series 6, image 73). Thyroid gland and trachea demonstrate no significant findings. Lungs/Pleura: Slight interval worsening in mild to moderate pulmonary fibrosis in a pattern with apical to basal gradient, featuring irregular peripheral interstitial opacity, septal thickening, traction bronchiectasis at the lung bases and small areas of subpleural bronchiolectasis without clear evidence of honeycombing. Fibrotic findings are more clearly worsened over a longer period of time dating back to at least 2018. No significant air trapping on expiratory phase imaging. Occasional small pulmonary nodules are stable and definitively benign, for example a 0.5 cm nodule of the posterior right upper lobe (series 7, image 62) and a 0.5 cm nodule of the dependent superior segment right lower lobe (series 7, image 84). No pleural effusion or pneumothorax. Upper Abdomen: No acute abnormality. Musculoskeletal: No chest wall abnormality. No suspicious osseous lesions identified. IMPRESSION: 1. Slight interval worsening in mild to moderate pulmonary fibrosis in a pattern with apical to basal gradient,  featuring irregular peripheral interstitial opacity, septal thickening, traction bronchiectasis at the lung bases and small areas of subpleural bronchiolectasis without clear evidence of honeycombing. Fibrotic findings are more clearly worsened over a longer period of time dating back to at least 2018. 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 [place], Iss 5, [id number], Aug 02 2017. 2. Occasional small pulmonary nodules are stable and definitively benign. No specific further follow-up or characterization is required. Aortic Atherosclerosis (ICD10-I70.0). Electronically Signed   By: Delanna Ahmadi M.D.   On: 05/29/2022 16:12      PFT     Latest Ref Rng & Units 09/26/2021    2:58 PM 05/07/2021    3:12 PM 08/10/2020   10:03 AM 07/19/2019    3:20 PM 02/24/2017   11:58 AM  PFT Results  FVC-Pre L 2.38  2.53  2.57  2.74  2.58   FVC-Predicted Pre % 79  84  84  93  85   FVC-Post L    2.63  2.45   FVC-Predicted Post %    89  81   Pre FEV1/FVC % % 87  86  86  86  82   Post FEV1/FCV % %    90  87   FEV1-Pre L 2.06  2.17  2.22  2.36  2.12   FEV1-Predicted Pre % 92  97  97  107  93   FEV1-Post L    2.35  2.14   DLCO uncorrected ml/min/mmHg 9.76  15.50  15.83  16.52  16.15   DLCO UNC% % 47  74  76  81  59   DLCO corrected ml/min/mmHg 9.48  15.50  15.34   14.93   DLCO COR %Predicted % 45  74  73   55   DLVA Predicted % 77  105  97  104  79   TLC L   4.50  3.97    TLC % Predicted %   81  74    RV % Predicted %   71  52         has a past medical history of Alpha-1-antitrypsin deficiency carrier, Arthritis, Atherosclerosis of aorta (HCC), Atrial fibrillation ([place]), Back pain, Complication of anesthesia, Diastolic dysfunction, Frequent UTI, GERD (gastroesophageal reflux disease), Hypertension,  Hypertriglyceridemia, Insulin resistance, Long term current use of anticoagulant, Multiple lung nodules, Muscular deconditioning, MVP (mitral valve prolapse), Obesity, OSA on CPAP, Osteoarthritis, Osteopenia, Pacemaker, PAF (paroxysmal atrial fibrillation) ([place]), Positive ANA (antinuclear antibody), Presbyesophagus, Presence of permanent cardiac pacemaker, Primary osteoarthritis of both hands, Pulmonary fibrosis ([place]), PVC's (premature ventricular contractions), Shortness of breath, Sinus arrest (10/2014), Sleep apnea, Stroke ([place]) (01/2014), Tachy-brady syndrome ([place]) (10/2014), TIA (transient ischemic attack), and Vitamin D deficiency.   reports that she quit smoking about 51 years ago. Her smoking use included cigarettes. She has a 36.00 pack-year smoking history. She has never used smokeless tobacco.  Past Surgical History:  Procedure Laterality Date   CARDIOVERSION N/A 09/02/2017   Procedure: CARDIOVERSION;  Surgeon: Sanda Klein, MD;  Location: [facility];  Service: Cardiovascular;  Laterality: N/A;   COLONOSCOPY  2019   ESOPHAGEAL DILATION     EXCISION VAGINAL CYST     benign nodules   INSERT / REPLACE / REMOVE PACEMAKER  10/04/2014   [employer] Advisa model A2DR01 serial number [id number]   LOOP RECORDER EXPLANT N/A 10/04/2014   Procedure: LOOP RECORDER EXPLANT;  Surgeon: Sanda Klein, MD;  Location: [facility];  Service: Cardiovascular;  Laterality: N/A;   LOOP RECORDER IMPLANT N/A 04/19/2014   Procedure: LOOP RECORDER IMPLANT;  Surgeon: Sanda Klein, MD;  Location: [facility];  Service: Cardiovascular;  Laterality: N/A;   NM MYOCAR PERF WALL MOTION  12/18/2007   normal   PERMANENT PACEMAKER INSERTION N/A 10/04/2014   Procedure: PERMANENT PACEMAKER INSERTION;  Surgeon: Sanda Klein, MD;  Location: [facility];  Service: Cardiovascular;  Laterality: N/A;   TUBAL LIGATION     US ECHOCARDIOGRAPHY  05/15/2010   LA mildly dilated,mild mitral annular ca+, AOV mildly sclerotic, mild  asymmetric LVH    Allergies  Allergen Reactions   Cefazolin Hives and Itching    Other reaction(s): severe hives   Pseudoephedrine Hcl     Other reaction(s): palpitations (moderate)   Ergotamine     unknown   Other Other (See Comments)   Pentobarbital     unknown   Pirfenidone     unknown   Caffeine Palpitations    Immunization History  Administered Date(s) Administered   Fluad Quad(high Dose 65+) 08/14/2021   Influenza Split 10/09/2009, 09/19/2010, 09/13/2020   Influenza Whole 09/23/2008   Influenza, High Dose Seasonal PF 09/13/2018, 09/27/2019, 09/26/2020   Influenza,inj,quad, With Preservative 09/04/2017   Influenza-Unspecified 09/17/2014, 09/09/2016, 09/04/2017   Moderna Sars-Covid-2 Vaccination 12/06/2019, 01/03/2020   Pneumococcal Conjugate-13 09/29/2014, 10/16/2015   Pneumococcal Polysaccharide-23 04/06/2009, 10/02/2017   Pneumococcal-Unspecified 10/01/2014, 10/02/2017   Td 04/05/1999   Tdap 04/06/2009   Zoster Recombinat (Shingrix) 09/29/2018, 01/19/2019   Zoster, Live 12/02/2008, 09/29/2018, 01/19/2019    Family History  Problem Relation Age of Onset   Heart attack Mother    Sudden death Mother    Stroke Father    Heart failure Father    Alcoholism Brother    Healthy Sister    Stroke Brother    Alpha-1 antitrypsin deficiency Daughter    Breast cancer Daughter  Prostate cancer Brother    Stroke Brother    Prostate cancer Brother    Healthy Brother    Prostate cancer Brother    Healthy Daughter    Diabetes Son    Arthritis Son      Current Outpatient Medications:    Acetaminophen (TYLENOL ARTHRITIS PAIN PO), Take 2 tablets by mouth daily as needed (For pain)., Disp: , Rfl:    apixaban (ELIQUIS) 5 MG TABS tablet, Take 1 tablet (5 mg total) by mouth 2 (two) times daily., Disp: 180 tablet, Rfl: 1   B Complex-C (SUPER B COMPLEX PO), Take 1 tablet by mouth daily., Disp: , Rfl:    Biotin 5000 MCG CAPS, Take 5,000 mcg by mouth daily., Disp: , Rfl:     Calcium Carbonate-Vit D-Min (CALCIUM 1200 PO), Take 1,200 mg by mouth daily. , Disp: , Rfl:    cholecalciferol (VITAMIN D) 1000 UNITS tablet, Take 6,000 Units by mouth daily. , Disp: , Rfl:    Co-Enzyme Q10 100 MG CAPS, Take 100 mg by mouth daily. , Disp: , Rfl:    diazepam (VALIUM) 5 MG tablet, TAKE 1 TABLET EACH DAY., Disp: 30 tablet, Rfl: 0   dofetilide (TIKOSYN) 500 MCG capsule, Take 1 capsule (500 mcg total) by mouth 2 (two) times daily., Disp: 180 capsule, Rfl: 2   fluticasone (FLONASE) 50 MCG/ACT nasal spray, Place 2 sprays into both nostrils daily as needed for allergies or rhinitis., Disp: , Rfl:    folic acid (FOLVITE) 161 MCG tablet, Take 400 mcg by mouth daily., Disp: , Rfl:    glucosamine-chondroitin 500-400 MG tablet, Take 1 tablet by mouth 2 (two) times daily. , Disp: , Rfl:    loratadine (CLARITIN) 10 MG tablet, Take 10 mg by mouth daily., Disp: , Rfl:    Magnesium 400 MG CAPS, Take 400 mg by mouth daily., Disp: 30 capsule, Rfl: 0   Multiple Vitamins-Minerals (ZINC PO), Take 1 tablet by mouth daily., Disp: , Rfl:    Nintedanib (OFEV) 100 MG CAPS, Take 1 capsule (100 mg total) by mouth 2 (two) times daily., Disp: 180 capsule, Rfl: 3   nitroGLYCERIN (NITROSTAT) 0.4 MG SL tablet, Place 1 tablet (0.4 mg total) under the tongue every 5 (five) minutes as needed for chest pain., Disp: 25 tablet, Rfl: 1   omeprazole (PRILOSEC) 40 MG capsule, Take 1 capsule by mouth daily., Disp: , Rfl:    sertraline (ZOLOFT) 100 MG tablet, TAKE 1 TABLET EACH DAY., Disp: 90 tablet, Rfl: 0   metoprolol tartrate (LOPRESSOR) 25 MG tablet, Take 1 tablet (25 mg total) by mouth 2 (two) times daily., Disp: 180 tablet, Rfl: 3      Objective:   Vitals:   05/31/22 1513  BP: 130/78  Pulse: 91  SpO2: 94%  Weight: 276 lb 9.6 oz (125.5 kg)  Height: _0  (1.702 m)    Estimated body mass index is 43.32 kg/m as calculated from the following:   Height as of this encounter: _1  (1.702 m).   Weight as of this  encounter: 276 lb 9.6 oz (125.5 kg).  _2 @  Filed Weights   05/31/22 1513  Weight: 276 lb 9.6 oz (125.5 kg)     Physical Exam    General: No distress. Looks same  OBESE Neuro: Alert and Oriented x 3. GCS 15. Speech normal Psych: Pleasant Resp:  Barrel Chest - no.  Wheeze - no, Crackles - yes at base, No overt respiratory distress CVS: Normal heart sounds. Murmurs -  no Ext: Stigmata of Connective Tissue Disease - no HEENT: Normal upper airway. PEERL +. No post nasal drip        Assessment:       ICD-10-CM   1. IPF (idiopathic pulmonary fibrosis) (HCC)  J84.112 Pulmonary function test    Brain natriuretic peptide    Basic metabolic panel    CBC with Differential/Platelet    Hepatic function panel    Hepatic function panel    CBC with Differential/Platelet    Basic metabolic panel    Brain natriuretic peptide    CANCELED: Hepatic function panel    CANCELED: CBC with Differential/Platelet    CANCELED: Basic metabolic panel    CANCELED: Brain natriuretic peptide    2. Research subject  Z00.6     3. DOE (dyspnea on exertion)  R06.09 Brain natriuretic peptide    Brain natriuretic peptide    CANCELED: Brain natriuretic peptide    4. Rectal bleeding  K62.5 Ambulatory referral to Gastroenterology         Plan:     Patient Instructions  IPF (idiopathic pulmonary fibrosis) ([place]) Medication monitoring encounter  - glad  overall you are toleratnig rechallenge of ofev 142m twice daily (lower dose regimen only for you) since 10/06/21 - symptoms same x 6 months but worse x 2 year; CT worse over 2 years   Plan  -  - continue ofev 1049mtwice daily - check LFT, CBC, BMET. BNP 05/31/2022 - check spirometry/dlco in 3 months - consider echo based on results  Rectal bleeding  - uncler if thre Is ongoing rectal bleeding since last visit; last hgb check normal JAn 2023 - virtual colonoscopy seemed normal but for dveritculosis' (per report)  Plan  - refer   [facility] ( wants to swtch from Dr KaAcie FredricksonI doc to [place]   Followup - 3 months do spirometry and dlco - return for 30 min visith wth DR RaChase Callern 2-3 months but after PFT  - walk test and symptoms questionnaire at followup  High complex medical condition that requires intensive therapeutic monitoring  SIGNATURE    Dr. MuBrand MalesM.D., F.C.C.P,  Pulmonary and Critical Care Medicine Staff Physician, CoCrystalirector - Interstitial Lung Disease  Program  [id number]: [phone] no answer or between  15:00h - 7:00h: call [phone] Telephone: [phone]  4:27 PM 05/31/2022

## 2022-06-01 LAB — CBC WITH DIFFERENTIAL/PLATELET
Absolute Monocytes: 1003 cells/uL — ABNORMAL HIGH (ref 200–950)
Basophils Absolute: 62 cells/uL (ref 0–200)
Basophils Relative: 0.7 %
Eosinophils Absolute: 308 cells/uL (ref 15–500)
Eosinophils Relative: 3.5 %
HCT: 45.9 % — ABNORMAL HIGH (ref 35.0–45.0)
Hemoglobin: 15.1 g/dL (ref 11.7–15.5)
Lymphs Abs: 2438 cells/uL (ref 850–3900)
MCH: 28.4 pg (ref 27.0–33.0)
MCHC: 32.9 g/dL (ref 32.0–36.0)
MCV: 86.4 fL (ref 80.0–100.0)
MPV: 10.9 fL (ref 7.5–12.5)
Monocytes Relative: 11.4 %
Neutro Abs: 4990 cells/uL (ref 1500–7800)
Neutrophils Relative %: 56.7 %
Platelets: 265 10*3/uL (ref 140–400)
RBC: 5.31 10*6/uL — ABNORMAL HIGH (ref 3.80–5.10)
RDW: 13.1 % (ref 11.0–15.0)
Total Lymphocyte: 27.7 %
WBC: 8.8 10*3/uL (ref 3.8–10.8)

## 2022-06-01 LAB — HEPATIC FUNCTION PANEL
AG Ratio: 1.3 (calc) (ref 1.0–2.5)
ALT: 13 U/L (ref 6–29)
AST: 17 U/L (ref 10–35)
Albumin: 3.8 g/dL (ref 3.6–5.1)
Alkaline phosphatase (APISO): 77 U/L (ref 37–153)
Bilirubin, Direct: 0.1 mg/dL (ref 0.0–0.2)
Globulin: 3 g/dL (calc) (ref 1.9–3.7)
Indirect Bilirubin: 0.4 mg/dL (calc) (ref 0.2–1.2)
Total Bilirubin: 0.5 mg/dL (ref 0.2–1.2)
Total Protein: 6.8 g/dL (ref 6.1–8.1)

## 2022-06-01 LAB — BASIC METABOLIC PANEL
BUN/Creatinine Ratio: 16 (calc) (ref 6–22)
BUN: 18 mg/dL (ref 7–25)
CO2: 26 mmol/L (ref 20–32)
Calcium: 8.9 mg/dL (ref 8.6–10.4)
Chloride: 104 mmol/L (ref 98–110)
Creat: 1.11 mg/dL — ABNORMAL HIGH (ref 0.60–0.95)
Glucose, Bld: 107 mg/dL — ABNORMAL HIGH (ref 65–99)
Potassium: 4.3 mmol/L (ref 3.5–5.3)
Sodium: 138 mmol/L (ref 135–146)

## 2022-06-01 LAB — BRAIN NATRIURETIC PEPTIDE: Brain Natriuretic Peptide: 134 pg/mL — ABNORMAL HIGH (ref <100)

## 2022-06-03 ENCOUNTER — Telehealth: Payer: Self-pay | Admitting: Internal Medicine

## 2022-06-03 NOTE — Telephone Encounter (Signed)
Attempted to call Erica from [place] but unable to reach. Left message for her to return call.

## 2022-06-03 NOTE — Telephone Encounter (Signed)
Lysle Dingwall  - BNP is high  Plan  - let her know that she might be having pulmonary hypertension and she should talk about it with her cardologist at visit 06/10/22  and I am copying Dr C on this as well  Thanks    SIGNATURE    Dr. Brand Males, M.D., F.C.C.P,  Pulmonary and Critical Care Medicine Staff Physician, [place] Director - Interstitial Lung Disease  Program  Medical Director - [facility] [place] at [place], [place], [address]  NPI Number:  NPI #[id number] [facility] Number: [id number]  Pager: [phone], If no answer  -[place] or Try [phone] Telephone (clinical office): [phone] Telephone (research): [phone]  11:10 AM 06/03/2022

## 2022-06-03 NOTE — Telephone Encounter (Signed)
Called and spoke with pt letting her know the results of bloodwork and info per MR and she verbalized understanding. Nothing further needed.

## 2022-06-05 NOTE — Telephone Encounter (Signed)
Office Visit  05/31/2022 [facility]  Brand Males, MD Pulmonary Disease IPF (idiopathic pulmonary fibrosis) (HCC) +3 more Dx Follow-up ; Referred by [name], MD Reason for Visit   Additional Documentation  Vitals:  BP 130/78 (BP Location: Left Arm, Patient Position: Sitting, Cuff Size: Large) Pulse 91 Ht 5' 7" (1.702 m) Wt 276 lb 9.6 oz (125.5 kg) LMP  (LMP Unknown) SpO2 94%  BMI 43.32 kg/m BSA 2.44 m  More Vitals  Flowsheets:  Extended Vitals, NEWS, MEWS Score, Anthropometrics, Ambulatory Pulse Oximetry, Method of Visit   Encounter Info:  Billing Info, History, Allergies, Detailed Report    All Notes   Progress Notes by Brand Males, MD at 05/31/2022 3:00 PM  Author: Brand Males, MD Author Type: Physician Filed: 05/31/2022  5:17 PM  Note Status: Signed Cosign: Cosign Not Required Encounter Date: 05/31/2022  Editor: Brand Males, MD (Physician)                     _0  ID: [name] [name], female    DOB: [birth year], 81 y.o.   MRN: [id number]       Chief Complaint  Patient presents with   Follow-up      Pt. Says she's had some bleeding.       Referring provider: [name], MD   HPI: 81 year old female former smoker quit in 1971.  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 Patient of Dr. Chase Caller, last seen by pulmonary NP on 04/10/21.    Previous [facility] pulmonary encounter: 04/10/2021 follow-up; Bronchitis, Pulmonary fibrosis Patient returns for a follow-up visit, patient was seen last week for cough, congestion, shortness of breath and chills.  COVID-19 testing was negative.  Patient had been fully vaccinated for COVID-19.  She had no increased oxygen demands.  Patient was given doxycycline for 7 days and prednisone 20 mg for 5 days. Patient complains ongoing cough and congestion , mucus remains green  on/off. No fever.  Eating well , no n/v.d. No hemoptysis , orthopnea or edema.  Called in over the weekend and was called in steroid burst. Currently on Prednisone 52m daily .  Chest xray showed chronic changes with no acute process.  [place].    04/17/2021 Patient presents today follow-up with PFTs. She did not have PFTs done today because she is still not feeling well. She completed course of doxycycline and has two days left of prednisone. Reports "cold symptoms" described as sinus drainage and cough. Sputum has not real purulence. Energy level is low. States that she feels her head is not right. She is using Flonase ans Claritin. She is short of breath. She completed doxycycline twice with no improvement. She is taking mucinex twice a day. She has not used albuterol rescue inhaler. Her ex husband died two weeks ago, she found out two days ago. Denies depressed feelings.      05/15/2021 -  Dr. RChase CallerType of visit: Telephone/Video [name]81y.o. - changed face to face visit to telephone visit due to > 49F heat. Wantd to know PFT resuts June 2022 copared to sept 2021. FVC down 1.6%, DLCO down 2.1% (compared to y  2years ago FVC declined 7.7%). On Esbeit since Dec 2021/Jan 2022.  Says Dr C sarted on her ditliazem 04/18/21 and after that esbriet became more intolerable. She had constipationa and retain fluids.  So,  she stopped all her medications except  eliquis and anti depressant and anti histamine. This means she stopped esbriet approx a week ago. Says that for several days still not feel better though better today. She is unsure if today is a good day v bad day. But overall feels A Fib is acting up. She wants to restarrtrt esbriet after good w wash out.    She is now using o2 at night but feels is not helping energy levels at day time. I Advised she could return it and then she said is actually helping her and does not want to return it.      08/14/21- Dr.  Chase Caller     Chief Complaint  Patient presents with   Follow-up      Pt states she has been doing okay since last visit. Still becomes SOB with exertion.        [name] [name] 81 y.o. -returns for follow-up.  Since her last visit because she was having calls with side effects from pirfenidone.  She stopped it.  She says after that she started feeling better.  Despite this she is reporting progressive decline in dyspnea.  Dyspnea score is listed below.  She uses oxygen with exertion as needed.  I at night she uses CPAP without oxygen.  She is frustrated with pirfenidone.  She asked about the alternative nintedanib.  She recollects that there constraints with it in the setting of heart disease and anticoagulation.  Explained that one of the study showed 0.5% increase incidence and MI with nintedanib.  But not the other studies.  Explained that surgical mechanism of anticoagulation but clinically patient have tolerated.  Explained about GI side effects particularly diarrhea.  She is nervous and hesitant but finally we took a shared decision making that she would start it at a lower dose given the fact it can be beneficial in reducing risk of progression.  We also discussed various aspects of clinical trials documented below.  She is interested in this as long as it is a medication with a much better side effect profile.  But at this point in time we took a shared decision making to go with standard of care therapy first.   She will have the flu shot today.  She is very nervous about getting mRNA COVID booster because of the side effect profile.  She had significant side effects apparently.  She also had omicron COVID in January 2022 and she is wondering about natural immunity.  However she is aware is COVID everywhere currently.  We took a shared decision making to check IgG antibody levels and adjust the risk threshold accordingly.   09/18/2021- Interim hx  Patient presents today for acute visit for  medication reaction to OFEV. Patient called our office yesterday with reports of rectal bleeding and abdominal pain since starting 09/17/21. She is newly on OFEV since 10/13 and is also on Eliquis. Our office advised her to stop OFEV. Rectal bleeding has since resolved, she still has some slight lower abdomen discomfort described as "bloating".  Breathing wise she is stable. She uses POC 2L with exertion as needed.  She is on oxygen at night with her CPAP. Denies f/c/s, diarrhea, nausea or vomitting.      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  Small pulmonary nodules measuring 5 mm or less stable dating back to 2018 consistent with a benign etiology.   OV 09/26/2021  Subjective:  [name], female , DOB: 04-22-1941 , age 38 y.o. , MRN: [id number] , ADDRESS: [address] [place] [address] PCP [name], MD Patient Care Team: [name], MD as PCP - General (Family Medicine) Sanda Klein, MD as PCP - Cardiology (Cardiology) Maisie Fus, MD as Consulting Physician (Obstetrics and Gynecology) Richmond Campbell, MD as Consulting Physician (Gastroenterology) Croitoru, Dani Gobble, MD as Consulting Physician (Cardiology) Kathie Rhodes, MD (Inactive) as Consulting Physician (Urology) Danella Sensing, MD as Consulting Physician (Dermatology) Brand Males, MD as Consulting Physician (Pulmonary Disease) Bo Merino, MD as Consulting Physician (Rheumatology) Barbaraann Cao, OD as Referring Physician (Optometry) Berenice Primas, MD as Referring Physician (Orthopedic Surgery)   This Provider for this visit: Treatment Team:  Attending Provider: Brand Males, MD       09/26/2021 -       Chief Complaint  Patient presents with   Follow-up      PFT performed today.  Pt states she has been doing okay since last visit.    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 Max dose 2 pilll tid since Dec 02, 2020 -> intermittent start/stop summer 2022 -> stopped aug 2022             - Overnight pulse oximetry done on 03/12/2021 shows time spent less than equal to 88% is 40.3 minutes.  Average pulse was 60/min.  Time spent in bradycardia 72.5%. Start 2 L nasal cannula at night   Multiple Lung Nodules 40m- stable 202/26/18-> July 2021 Alpha-1 MZ but does not have emphysema on CT 2February 26, 2021  [Daughter died from alpha-1 CC] Obesity  Diastolic dysfunction Atrial fibrillation on Eliquis Obstructive sleep apnea on CPAP without oxygen COVID-19 incidental 12/28/2020 - likely Omicron4   HPI BESTHA [id number].o. -presents ith daughter ? SJeralyn Ruthsmeeting first time. Doing stable from symptom stand point resp wise but PFT down significantly. Took ofev for 5 days and there are aspectso of taking ofev (1046mbid - the lower dose) tht she liked. She felt her urinary stream had better flow (not a known side effect). She had lower appetite (known Se with ofev) but she liked the curb on appetie it due to her obesity. The 5d into she had had mild discomfort in suprapubic area (known SE for abd discmofort) and had 1 drop of painless rectal bleed. So stopped ofev (also on eliquisl bleeding theoertical risk with ofev). After stopping ofev, urinary flow is back to baseline(does not like it), and no mpre rectal bleeding . She never had rectal bleeding before and afer. She  again stated and made clear the so called bleeding was "1 drop of blood ont he floor". Associted  with ofev intake on background of eliquis and associted with suprapubic discomfor. No dysuria. Dr Brien Mates is her GI. Last colonoscopy was when she ws 47 and was normal apparently   Discussed   - seeing a GI doc for rectal bleed - she is agreeable    - discussed rechallenge with ofev v joining clinical trial phase 3( to be volunteer + exploit therapeutic advantage with 50% chance of placebo) Discussed other asoects of trials. Explained the 095 Zephrysu study with IV MAB against CTGF Pamrevulumab v placeo.  See below. She and dauhger want her to do clinical trial but are more enthused about the idea of a ofev rechallenge first. We discussed that rechallenge liketly to cause side effects but anecdotally seems some patients who do not have issues. She is not interested in esbriet rechallenge             OV 10/10/2021   Subjective:  [name], female , DOB: May 11, 1941 , age 53 y.o. , MRN: [id number] , ADDRESS: [place] [address] PCP [name], MD Patient Care Team: [name], MD as PCP - General (Family Medicine) Croitoru, Dani Gobble, MD as PCP - Cardiology (Cardiology) Maisie Fus, MD as Consulting Physician (Obstetrics and Gynecology) Richmond Campbell, MD as Consulting Physician (Gastroenterology) Croitoru, Dani Gobble, MD as Consulting Physician (Cardiology) Kathie Rhodes, MD (Inactive) as Consulting Physician (Urology) Danella Sensing, MD as Consulting Physician (Dermatology) Brand Males, MD as Consulting Physician (Pulmonary Disease) Bo Merino, MD as Consulting Physician (Rheumatology) Barbaraann Cao, OD as Referring Physician (Optometry) Berenice Primas, MD as Referring Physician (Orthopedic Surgery)   This Provider for this visit: Treatment Team:  Attending Provider: Brand Males, MD       10/10/2021 -       Chief Complaint  Patient presents with   Follow-up      Patient states that  she is doing good with the  Ofev and is currently not having any side effects. States that she has been coughing a lot lately. Wants to know if she is due for pneumonia vaccine.    Type of visit: Telephone/Video Circumstance: COVID-19 national emergency Identification of patient [name] [name] with 28-Mar-1941 and MRN [id number] - 2 person identifier Risks: Risks, benefits, limitations of telephone visit explained. Patient understood and verbalized agreement to proceed Anyone else on call: just patient Patient location: her home This provider location: [address] [place], [place] [address]     HPI KEILY LEPP 81 y.o. -at this point telephone visit is to see how rechallenge with nintedanib is ongoing.  Since 10/06/2021 Saturday she is on 100 mg twice daily.  She says she has not had any recurrence of bleeding.  No GI symptoms.  Tolerating it well.  Dyspnea is not any worse.  No new issues.  Noticed mild reduction in GFR in October 2022.  Liver function test and hemoglobin are fine.                         OV 11/09/2021   Subjective:  [name], female , DOB: 1941/06/03 , age 66 y.o. , MRN: [id number] , ADDRESS: [place] [address] PCP [name], MD Patient Care Team: [name], MD as PCP - General (Family Medicine) Sanda Klein, MD as PCP - Cardiology (  Cardiology) Maisie Fus, MD as Consulting Physician (Obstetrics and Gynecology) Richmond Campbell, MD as Consulting Physician (Gastroenterology) Croitoru, Dani Gobble, MD as Consulting Physician (Cardiology) Kathie Rhodes, MD (Inactive) as Consulting Physician (Urology) Danella Sensing, MD as Consulting Physician (Dermatology) Brand Males, MD as Consulting Physician (Pulmonary Disease) Bo Merino, MD as Consulting Physician (Rheumatology) Barbaraann Cao, OD as Referring Physician (Optometry) Berenice Primas, MD as Referring Physician (Orthopedic Surgery)   This Provider for this visit:  Treatment Team:  Attending Provider: Brand Males, MD       11/09/2021 -       Chief Complaint  Patient presents with   Follow-up      Pt states she is about the same since last visit. Still becomes SOB with exertion.      HPI [name] [name] 81 y.o. -returns for follow-up.  She restarted nintedanib on 10/06/2021 at the lower dose.  She tells me that she has not had any rectal bleeding except yesterday there was again a few drops.  No abdominal pain.  Last visit I told her to go see GI doctor but she has not.  Her GI doctor was Dr. Cristina Gong in [place] but has since retired.  Advised that that I will make a referral.  In terms of shortness of breath she is stable.  She believes she is tolerating the nintedanib fine.  She is actually gained some weight.  She also reports that for the last 2 weeks she is on metoprolol because of cardiac rhythm issues.  At today blood pressure was 454 systolic.Marland Kitchen  She tells me this is high for her.  She has never had this problem before according to history.  But the observations only today.  She acknowledges that this could be whitecoat hypertension as well.   Last CT scan of the chest July 2021 Last pulmonary function test October 2022   She is not fully compliant with the portable oxygen.         No results found.         OV 05/31/2022   Subjective:  [name], female , DOB: 1941-08-10 , age 89 y.o. , MRN: [id number] , ADDRESS: [address] [place] [address] PCP [name], MD Patient Care Team: [name], MD as PCP - General (Family Medicine) Croitoru, Dani Gobble, MD as PCP - Cardiology (Cardiology) Maisie Fus, MD as Consulting Physician (Obstetrics and Gynecology) Richmond Campbell, MD as Consulting Physician (Gastroenterology) Croitoru, Dani Gobble, MD as Consulting Physician (Cardiology) Kathie Rhodes, MD (Inactive) as Consulting Physician (Urology) Danella Sensing, MD as Consulting Physician  (Dermatology) Brand Males, MD as Consulting Physician (Pulmonary Disease) Bo Merino, MD as Consulting Physician (Rheumatology) Barbaraann Cao, OD as Referring Physician (Optometry) Berenice Primas, MD as Referring Physician (Orthopedic Surgery)   This Provider for this visit: Treatment Team:  Attending Provider: Brand Males, MD     Follow-up IPF              - sept 09, 2021 update - IPF dx givien: age greater than 79, only trace positive ANAdescription of small hiatal hernia on CT scan, the type of pattern on the CT scan 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 Max dose 2 pilll tid since Dec 02, 2020 -> intermittent start/stop summer 2022 -> stopped aug 2022             - Overnight pulse oximetry done on 03/12/2021 shows time spent less than equal to 88% is 40.3 minutes.  Average pulse was 60/min.  Time spent in bradycardia 72.5%. Start 2 L nasal cannula at night   Multiple Lung Nodules 49m- stable 22018/02/09-> July 2021 Alpha-1 MZ but does not have emphysema on CT 202-09-21  [Daughter died from alpha-1 CC] Obesity  Diastolic dysfunction Atrial fibrillation on Eliquis Obstructive sleep apnea on CPAP without oxygen COVID-19 incidental 12/28/2020 - likely Omicron4   05/31/2022 -       Chief Complaint  Patient presents with   Follow-up      Pt states she is still having problems with her breathing. Also states that she feels like she is more constipated than before and feels like she might have some internal bleeding.        HPI BNYLAN NAKATANI88y.o. -on finger for several months.  She believes IPF is slowly getting worse.  Review of the symptom score shows that symptoms are definitely worse in the last 2 years but  compared to 6 months ago around the same.  Shared high-resolution CT chest that shows progression of ILD over 2 years.  I shared the results with her.  This fits in with the symptoms also getting worse over the last 2 years.  She is worried about the progression.  In the past we recommended she consider clinical trial as a care option but she declined.  This is still under consideration for her but she has not made up her mind.  Last echocardiogram was a few years ago.  Of note she has had rectal bleeding last year.  I referred her to Dr. KRoena Maladyand GI.  She had virtual colonoscopy.  She asked me to read the report.  It shows she has diverticulosis but does not seem anything hugely abnormal.  She is unsure if she still having rectal bleeding.  She thinks she might be.  She wants to switch GI doctors     She also tells me that since last visit she is on a new medicine for atrial fibrillation.  Tikosyn.  She wanted me to palpate a pulse.  I did this for 30 seconds and told her at least for the 30 seconds she is in sinus rhythm.   SYMPTOM SCALE - ILD 07/21/2019   08/10/2020 262# 09/26/2020 Last Weight  Most recent update: 09/26/2020  1:42 PM      Weight  120.9 kg (266 lb 9.6 oz)                     11/09/2020 274# 01/23/2021 279# 08/14/2021 275# 09/18/2021 Started OFEV 10/13 but stoped 10/17 d/t GIB  09/26/2021 Off ofev. No more "rectal bleed" 11/09/2021 Back on ofev 1066mbid since 11/5/2   280# 05/31/2022 276#  O2 use ra ra ra o2 with ex 02 with ex   O2 with exertoin        Shortness of Breath 0 -> 5 scale with 5 being worst (  score 6 If unable to do)                    At rest 0 0   5 0 4 0 0 0 0  Simple tasks - showers, clothes change, eating, shaving _0 3._1 Household (dishes, doing bed, laundry) _2 Shopping _3 4.5 4 4.5 4  Walking level at own pace _4 4.5 3  Walking up Stairs _5 Total (40 - 48) Dyspnea Score _6 23.5 22._7 How bad is your cough? 1 1-_8 How bad is your fatigue _9 3._10 nausea   0 0 0 0 0 0 0 0 0.25  vomit   0 0 0 0 0 0 0 0 0  diarrhea   0 0 0 1 0 0.5 0 00 Once in a while  axiety   2 1 0 0 0.5 0.5 0 0 0  depression   2 1._11 0.5-_12 Simple office walk 185 feet x  3 laps goal with forehead probe 07/21/2019   08/10/2020   01/23/2021   09/18/2021   05/31/2022    O2 used _13   Number laps completed 3 laps _14 Only 1 of 3 laps  Comments about pace modertae with walker Slow pace Sow pace Slow pace slow  Resting Pulse Ox/HR 97% and 78/min 100% and 74/min 94%and HR 92 96% RA and HR 80 98% and HR 64  Final Pulse Ox/HR 93% and 109/min 77% and 85/min 90 % and HR 100 90% and HR 125 97% and HR 105  Desaturated </= 88% _15   Desaturated <= 3% points Yes, 4 Yes, 13 points Yes, 4 piints Yes, 6 points no  Got Tachycardic >/= 90/min you have pulmonary fibrosis otherwise call interstitial lung disease yes no yes Yes yes  Symptoms at end of test dyspnea Mild dyspnea Dyspnea throughtou Dyspnea  Mod dspnea  Miscellaneous comments x Corrected with 2LNC and got    X  Very deconditioned      CT Chest data     CT Chest High Resolution   Result Date: 05/29/2022 CLINICAL DATA:  Interstitial lung disease, IPF EXAM: CT CHEST WITHOUT CONTRAST TECHNIQUE: Multidetector CT imaging of the chest was performed following the standard protocol without intravenous contrast. High resolution imaging of the lungs, as well as inspiratory and expiratory imaging, was performed. RADIATION DOSE REDUCTION: This exam was performed according to the departmental dose-optimization program which includes automated exposure control, adjustment of the mA and/or kV according to patient size and/or use of iterative reconstruction technique. COMPARISON:  05/31/2020, 04/05/2019, 01/06/2017 FINDINGS: Cardiovascular: Aortic atherosclerosis. Left chest multi  lead pacer. Normal heart size. No pericardial effusion. Mediastinum/Nodes: No enlarged mediastinal, hilar, or axillary lymph nodes. Somewhat patulous esophagus (series 6, image 73). Thyroid gland and trachea demonstrate no significant findings. Lungs/Pleura: Slight interval worsening in mild to moderate pulmonary fibrosis in a pattern with apical to basal gradient, featuring irregular peripheral interstitial opacity, septal thickening,  traction bronchiectasis at the lung bases and small areas of subpleural bronchiolectasis without clear evidence of honeycombing. Fibrotic findings are more clearly worsened over a longer period of time dating back to at least 2018. No significant air trapping on expiratory phase imaging. Occasional small pulmonary nodules are stable and definitively benign, for example a 0.5 cm nodule of the posterior right upper lobe (series 7, image 62) and a 0.5 cm nodule of the dependent superior segment right lower lobe (series 7, image 84). No pleural effusion or pneumothorax. Upper Abdomen: No acute abnormality. Musculoskeletal: No chest wall abnormality. No suspicious osseous lesions identified. IMPRESSION: 1. Slight interval worsening in mild to moderate pulmonary fibrosis in a pattern with apical to basal gradient, featuring irregular peripheral interstitial opacity, septal thickening, traction bronchiectasis at the lung bases and small areas of subpleural bronchiolectasis without clear evidence of honeycombing. Fibrotic findings are more clearly worsened over a longer period of time dating back to at least 2018. 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 [place], Iss 5, [id number], Aug 02 2017. 2. Occasional small pulmonary nodules are stable and definitively benign. No specific further  follow-up or characterization is required. Aortic Atherosclerosis (ICD10-I70.0). Electronically Signed   By: Delanna Ahmadi M.D.   On: 05/29/2022 16:12         PFT       Latest Ref Rng & Units 09/26/2021    2:58 PM 05/07/2021    3:12 PM 08/10/2020   10:03 AM 07/19/2019    3:20 PM 02/24/2017   11:58 AM  PFT Results  FVC-Pre L 2.38  2.53  2.57  2.74  2.58   FVC-Predicted Pre % 79  84  84  93  85   FVC-Post L       2.63  2.45   FVC-Predicted Post %       89  81   Pre FEV1/FVC % % 87  86  86  86  82   Post FEV1/FCV % %       90  87   FEV1-Pre L 2.06  2.17  2.22  2.36  2.12   FEV1-Predicted Pre % 92  97  97  107  93   FEV1-Post L       2.35  2.14   DLCO uncorrected ml/min/mmHg 9.76  15.50  15.83  16.52  16.15   DLCO UNC% % 47  74  76  81  59   DLCO corrected ml/min/mmHg 9.48  15.50  15.34    14.93   DLCO COR %Predicted % 45  74  73    55   DLVA Predicted % 77  105  97  104  79   TLC L     4.50  3.97     TLC % Predicted %     81  74     RV % Predicted %     71  52              has a past medical history of Alpha-1-antitrypsin deficiency carrier, Arthritis, Atherosclerosis of aorta (HCC), Atrial fibrillation (HCC), Back pain, Complication of anesthesia, Diastolic dysfunction, Frequent UTI, GERD (gastroesophageal reflux disease), Hypertension, Hypertriglyceridemia, Insulin resistance, Long term current use of anticoagulant, Multiple lung nodules, Muscular deconditioning, MVP (mitral valve prolapse), Obesity, OSA on CPAP, Osteoarthritis, Osteopenia, Pacemaker,  PAF (paroxysmal atrial fibrillation) ([place]), Positive ANA (antinuclear antibody), Presbyesophagus, Presence of permanent cardiac pacemaker, Primary osteoarthritis of both hands, Pulmonary fibrosis ([place]), PVC's (premature ventricular contractions), Shortness of breath, Sinus arrest (10/2014), Sleep apnea, Stroke ([place]) (01/2014), Tachy-brady syndrome ([place]) (10/2014), TIA (transient ischemic attack), and Vitamin D deficiency.    reports that  she quit smoking about 51 years ago. Her smoking use included cigarettes. She has a 36.00 pack-year smoking history. She has never used smokeless tobacco.        Past Surgical History:  Procedure Laterality Date   CARDIOVERSION N/A 09/02/2017    Procedure: CARDIOVERSION;  Surgeon: Sanda Klein, MD;  Location: [facility];  Service: Cardiovascular;  Laterality: N/A;   COLONOSCOPY   2019   ESOPHAGEAL DILATION       EXCISION VAGINAL CYST        benign nodules   INSERT / REPLACE / REMOVE PACEMAKER   10/04/2014    Medtronic Advisa model A2DR01 serial number [id number]   LOOP RECORDER EXPLANT N/A 10/04/2014    Procedure: LOOP RECORDER EXPLANT;  Surgeon: Sanda Klein, MD;  Location: [facility];  Service: Cardiovascular;  Laterality: N/A;   LOOP RECORDER IMPLANT N/A 04/19/2014    Procedure: LOOP RECORDER IMPLANT;  Surgeon: Sanda Klein, MD;  Location: [facility];  Service: Cardiovascular;  Laterality: N/A;   NM MYOCAR PERF WALL MOTION   12/18/2007    normal   PERMANENT PACEMAKER INSERTION N/A 10/04/2014    Procedure: PERMANENT PACEMAKER INSERTION;  Surgeon: Sanda Klein, MD;  Location: [facility];  Service: Cardiovascular;  Laterality: N/A;   TUBAL LIGATION       US ECHOCARDIOGRAPHY   05/15/2010    LA mildly dilated,mild mitral annular ca+, AOV mildly sclerotic, mild asymmetric LVH           Allergies  Allergen Reactions   Cefazolin Hives and Itching      Other reaction(s): severe hives   Pseudoephedrine Hcl        Other reaction(s): palpitations (moderate)   Ergotamine        unknown   Other Other (See Comments)   Pentobarbital        unknown   Pirfenidone        unknown   Caffeine Palpitations          Immunization History  Administered Date(s) Administered   Fluad Quad(high Dose 65+) 08/14/2021   Influenza Split 10/09/2009, 09/19/2010, 09/13/2020   Influenza Whole 09/23/2008   Influenza, High Dose Seasonal PF 09/13/2018, 09/27/2019, 09/26/2020   Influenza,inj,quad,  With Preservative 09/04/2017   Influenza-Unspecified 09/17/2014, 09/09/2016, 09/04/2017   Moderna Sars-Covid-2 Vaccination 12/06/2019, 01/03/2020   Pneumococcal Conjugate-13 09/29/2014, 10/16/2015   Pneumococcal Polysaccharide-23 04/06/2009, 10/02/2017   Pneumococcal-Unspecified 10/01/2014, 10/02/2017   Td 04/05/1999   Tdap 04/06/2009   Zoster Recombinat (Shingrix) 09/29/2018, 01/19/2019   Zoster, Live 12/02/2008, 09/29/2018, 01/19/2019           Family History  Problem Relation Age of Onset   Heart attack Mother     Sudden death Mother     Stroke Father     Heart failure Father     Alcoholism Brother     Healthy Sister     Stroke Brother     Alpha-1 antitrypsin deficiency Daughter     Breast cancer Daughter     Prostate cancer Brother     Stroke Brother     Prostate cancer Brother     Healthy Brother     Prostate  cancer Brother     Healthy Daughter     Diabetes Son     Arthritis Son          Current Outpatient Medications:    Acetaminophen (TYLENOL ARTHRITIS PAIN PO), Take 2 tablets by mouth daily as needed (For pain)., Disp: , Rfl:    apixaban (ELIQUIS) 5 MG TABS tablet, Take 1 tablet (5 mg total) by mouth 2 (two) times daily., Disp: 180 tablet, Rfl: 1   B Complex-C (SUPER B COMPLEX PO), Take 1 tablet by mouth daily., Disp: , Rfl:    Biotin 5000 MCG CAPS, Take 5,000 mcg by mouth daily., Disp: , Rfl:    Calcium Carbonate-Vit D-Min (CALCIUM 1200 PO), Take 1,200 mg by mouth daily. , Disp: , Rfl:    cholecalciferol (VITAMIN D) 1000 UNITS tablet, Take 6,000 Units by mouth daily. , Disp: , Rfl:    Co-Enzyme Q10 100 MG CAPS, Take 100 mg by mouth daily. , Disp: , Rfl:    diazepam (VALIUM) 5 MG tablet, TAKE 1 TABLET EACH DAY., Disp: 30 tablet, Rfl: 0   dofetilide (TIKOSYN) 500 MCG capsule, Take 1 capsule (500 mcg total) by mouth 2 (two) times daily., Disp: 180 capsule, Rfl: 2   fluticasone (FLONASE) 50 MCG/ACT nasal spray, Place 2 sprays into both nostrils daily as needed for  allergies or rhinitis., Disp: , Rfl:    folic acid (FOLVITE) 299 MCG tablet, Take 400 mcg by mouth daily., Disp: , Rfl:    glucosamine-chondroitin 500-400 MG tablet, Take 1 tablet by mouth 2 (two) times daily. , Disp: , Rfl:    loratadine (CLARITIN) 10 MG tablet, Take 10 mg by mouth daily., Disp: , Rfl:    Magnesium 400 MG CAPS, Take 400 mg by mouth daily., Disp: 30 capsule, Rfl: 0   Multiple Vitamins-Minerals (ZINC PO), Take 1 tablet by mouth daily., Disp: , Rfl:    Nintedanib (OFEV) 100 MG CAPS, Take 1 capsule (100 mg total) by mouth 2 (two) times daily., Disp: 180 capsule, Rfl: 3   nitroGLYCERIN (NITROSTAT) 0.4 MG SL tablet, Place 1 tablet (0.4 mg total) under the tongue every 5 (five) minutes as needed for chest pain., Disp: 25 tablet, Rfl: 1   omeprazole (PRILOSEC) 40 MG capsule, Take 1 capsule by mouth daily., Disp: , Rfl:    sertraline (ZOLOFT) 100 MG tablet, TAKE 1 TABLET EACH DAY., Disp: 90 tablet, Rfl: 0   metoprolol tartrate (LOPRESSOR) 25 MG tablet, Take 1 tablet (25 mg total) by mouth 2 (two) times daily., Disp: 180 tablet, Rfl: 3        Objective:       Vitals:    05/31/22 1513  BP: 130/78  Pulse: 91  SpO2: 94%  Weight: 276 lb 9.6 oz (125.5 kg)  Height: 5' 7" (1.702 m)      Estimated body mass index is 43.32 kg/m as calculated from the following:   Height as of this encounter: 5' 7" (1.702 m).   Weight as of this encounter: 276 lb 9.6 oz (125.5 kg).   _0 @      Filed Weights    05/31/22 1513  Weight: 276 lb 9.6 oz (125.5 kg)       Physical Exam       General: No distress. Looks same  OBESE Neuro: Alert and Oriented x 3. GCS 15. Speech normal Psych: Pleasant Resp:  Barrel Chest - no.  Wheeze - no, Crackles - yes at base, No overt respiratory distress CVS: Normal heart  sounds. Murmurs - no Ext: Stigmata of Connective Tissue Disease - no HEENT: Normal upper airway. PEERL +. No post nasal drip            Assessment:         ICD-10-CM    1.  IPF (idiopathic pulmonary fibrosis) (HCC)  J84.112 Pulmonary function test      Brain natriuretic peptide      Basic metabolic panel      CBC with Differential/Platelet      Hepatic function panel      Hepatic function panel      CBC with Differential/Platelet      Basic metabolic panel      Brain natriuretic peptide      CANCELED: Hepatic function panel      CANCELED: CBC with Differential/Platelet      CANCELED: Basic metabolic panel      CANCELED: Brain natriuretic peptide     2. Research subject  Z00.6       3. DOE (dyspnea on exertion)  R06.09 Brain natriuretic peptide      Brain natriuretic peptide      CANCELED: Brain natriuretic peptide     4. Rectal bleeding  K62.5 Ambulatory referral to Gastroenterology            Plan:     Patient Instructions  IPF (idiopathic pulmonary fibrosis) ([place]) Medication monitoring encounter   - glad  overall you are toleratnig rechallenge of ofev 156m twice daily (lower dose regimen only for you) since 10/06/21 - symptoms same x 6 months but worse x 2 year; CT worse over 2 years     Plan  -  - continue ofev 1052mtwice daily - check LFT, CBC, BMET. BNP 05/31/2022 - check spirometry/dlco in 3 months - consider echo based on results   Rectal bleeding   - uncler if thre Is ongoing rectal bleeding since last visit; last hgb check normal JAn 2023 - virtual colonoscopy seemed normal but for dveritculosis' (per report)   Plan  - refer  [facility] ( wants to swtch from Dr KaAcie FredricksonI doc to [facility]     Followup - 3 months do spirometry and dlco - return for 30 min visith wth DR RaChase Callern 2-3 months but after PFT             - walk test and symptoms questionnaire at followup   High complex medical condition that requires intensive therapeutic monitoring   SIGNATURE      Dr. MuBrand MalesM.D., F.C.C.P,  Pulmonary and Critical Care Medicine Staff Physician, [facility] - Interstitial Lung  Disease  Program  Pulmonary [id number] Pager: [phone] no answer or between  15:00h - 7:00h: call [phone] Telephone: [phone]   4:27 PM 05/31/2022          Patient Instructions by RaBrand MalesMD at 05/31/2022 3:00 PM  Author: RaBrand MalesMD Author Type: Physician Filed: 05/31/2022  4:27 PM  Note Status: Addendum Cosign: Cosign Not Required Encounter Date: 05/31/2022  Editor: RaBrand MalesMD (Physician)      Prior Versions: 1. RaBrand MalesMD (Physician) at 05/31/2022  3:37 PM - Addendum   2. RaBrand MalesMD (Physician) at 05/31/2022  3:33 PM - Addendum   3. RaBrand MalesMD (Physician) at 05/31/2022  3:33 PM - Addendum  4. Brand Males, MD (Physician) at 05/31/2022  3:20 PM - Signed  IPF (idiopathic pulmonary fibrosis) ([place]) Medication monitoring encounter   - glad  overall you are toleratnig rechallenge of ofev 197m twice daily (lower dose regimen only for you) since 10/06/21 - symptoms same x 6 months but worse x 2 year; CT worse over 2 years     Plan  -  - continue ofev 1035mtwice daily - check LFT, CBC, BMET. BNP 05/31/2022 - check spirometry/dlco in 3 months - consider echo based on results   Rectal bleeding   - uncler if thre Is ongoing rectal bleeding since last visit; last hgb check normal JAn 2023 - virtual colonoscopy seemed normal but for dveritculosis' (per report)   Plan  - refer  [facility] ( wants to [place]     Followup - 3 months do spirometry and dlco - return for 30 min visith wth DR RaChase Callern 2-3 months but after PFT             - walk test and symptoms questionnaire at followup       Orthostatic Vitals Recorded in This Encounter   05/31/2022  1513     Patient Position: Sitting  BP Location: Left Arm  Cuff Size: Large   Instructions  IPF (idiopathic pulmonary  fibrosis) [place]   - glad  overall you are toleratnig rechallenge of ofev 10061mwice daily (lower dose regimen only for you) since 10/06/21 - symptoms same x 6 months but worse x 2 year; CT worse over 2 years     Plan  -  - continue ofev 100m23mice daily - check LFT, CBC, BMET. BNP 05/31/2022 - check spirometry/dlco in 3 months - consider echo based on results   Rectal bleeding   - uncler if thre Is ongoing rectal bleeding since last visit; last hgb check normal JAn 2023 - virtual colonoscopy seemed normal but for dveritculosis' (per report)   Plan  - refer  [facility] ( wants to [facility] [place] [facility]ie Fredricks[place]oc [facility])      Followup - 3 months do spirometry and dlco - return for 30 min visith wth DR RamaChase Caller2-3 months but after PFT             - walk test and symptoms questionnaire at followup        Called and spoke with patient to see if she wanted me [facility] to switch providers. She said to hold it. Will let them know to hold off on procedure for right now. Nothing further needed at this time.

## 2022-06-10 ENCOUNTER — Ambulatory Visit (INDEPENDENT_AMBULATORY_CARE_PROVIDER_SITE_OTHER): Payer: Medicare Other | Admitting: Cardiovascular Disease

## 2022-06-10 ENCOUNTER — Encounter: Payer: Self-pay | Admitting: Cardiovascular Disease

## 2022-06-10 VITALS — BP 108/64 | HR 87 | Ht 67.0 in | Wt 276.8 lb

## 2022-06-10 DIAGNOSIS — G4733 Obstructive sleep apnea (adult) (pediatric): Secondary | ICD-10-CM | POA: Diagnosis not present

## 2022-06-10 DIAGNOSIS — J84112 Idiopathic pulmonary fibrosis: Secondary | ICD-10-CM

## 2022-06-10 DIAGNOSIS — D6869 Other thrombophilia: Secondary | ICD-10-CM | POA: Diagnosis not present

## 2022-06-10 DIAGNOSIS — I480 Paroxysmal atrial fibrillation: Secondary | ICD-10-CM | POA: Diagnosis not present

## 2022-06-10 DIAGNOSIS — I2721 Secondary pulmonary arterial hypertension: Secondary | ICD-10-CM | POA: Diagnosis not present

## 2022-06-10 DIAGNOSIS — I495 Sick sinus syndrome: Secondary | ICD-10-CM

## 2022-06-10 DIAGNOSIS — I5032 Chronic diastolic (congestive) heart failure: Secondary | ICD-10-CM

## 2022-06-10 DIAGNOSIS — Z950 Presence of cardiac pacemaker: Secondary | ICD-10-CM

## 2022-06-10 NOTE — Patient Instructions (Signed)
Medication Instructions:  No changes *If you need a refill on your cardiac medications before your next appointment, please call your pharmacy*   Lab Work: None ordered If you have labs (blood work) drawn today and your tests are completely normal, you will receive your results only by: [place] (if you have MyChart) OR A paper copy in the mail If you have any lab test that is abnormal or we need to change your treatment, we will call you to review the results.   Testing/Procedures: None ordered   Follow-Up: At [facility], you and your health needs are our priority.  As part of our continuing mission to provide you with exceptional heart care, we have created designated Provider Care Teams.  These Care Teams include your primary Cardiologist (physician) and Advanced Practice Providers (APPs -  Physician Assistants and Nurse Practitioners) who all work together to provide you with the care you need, when you need it.  We recommend signing up for the patient portal called "MyChart".  Sign up information is provided on this After Visit Summary.  MyChart is used to connect with patients for Virtual Visits (Telemedicine).  Patients are able to view lab/test results, encounter notes, upcoming appointments, etc.  Non-urgent messages can be sent to your provider as well.   To learn more about what you can do with MyChart, go to [email or web address].    Your next appointment:   6 month(s)  The format for your next appointment:   In Person  Provider:   Sanda Klein, MD {    Important Information About Sugar

## 2022-06-10 NOTE — Progress Notes (Incomplete)
Cardiology Office Note    Date:  06/11/2022   ID:  [name] [name], DOB 05-29-1941, MRN [id number]  PCP:  [name], MD  Cardiologist:   Sanda Klein, MD   Chief Complaint  Patient presents with   Pacemaker Check   Shortness of Breath     History of Present Illness:  [name] [name] is a 81 y.o. 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  Overall she is doing roughly the same.  She continues to have shortness of breath with activity, NYHA functional class III.  She prefers not to wear her oxygen which is prescribed during activity only.  She reports that at rest her oxygen saturation is typically 92-93%.  With minimal activity her oxygen saturation drops to the low 80s.  She does not have orthopnea or PND.  She denies any change in her lower extremity edema.  This is limited to the ankles and is only present towards the end of the day.  She is not currently taking any diuretics.  She has not had any angina pectoris and is unaware of any palpitations.  She denies dizziness or syncope.  Pacemaker interrogation shows a marked reduction in her burden of atrial fibrillation after initiation of dofetilide about 4 months ago.  She had a roughly 5-week period of persistent atrial fibrillation throughout February and early March before starting dofetilide.  She has not had any meaningful episodes of atrial fibrillation since then.  She has 82% atrial pacing and almost never requires ventricular pacing.  Anticipated pacemaker generator longevity is 2.5 years.  There have been no episodes of high ventricular rate.  She had normal renal function and potassium levels on labs checked  05/24/2022.  She has interstitial lung disease (CT appearance consistent with usual interstitial pneumonia) and has a recent high-resolution CT that showed "slight interval worsening".  She is on treatment with Ofev and is followed by Dr. Chase Caller.  She was reluctant to undergo sedation for conventional colonoscopy and had a virtual colonoscopy that showed only diverticulosis, no masses.  Incidental note was made of atherosclerosis in the abdominal aorta.  She also had a barium swallow that showed a stricture at the gastroesophageal junction where the 13 mm barium tablet could not pass.  She has evidence of atherosclerosis of the aorta. She does not have angina pectoris. Nuclear stress test in January 2018 was normal/low risk. Her echocardiogram in April 2015 showed evidence of diastolic dysfunction without elevated filling pressures. Similar findings on echo in September 2017 with EF 50-55% and grade 1 diastolic dysfunction without elevated filling pressures. She has not had clinically evident congestive heart failure in the past. 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 She has a long-standing history of obstructive sleep apnea but uses CPAP. 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  She has a lifelong struggle with obesity and has prediabetes.  She is enrolled  in Dr. Migdalia Dk bariatric clinic  Past Medical History:  Diagnosis Date   Alpha-1-antitrypsin deficiency carrier    Arthritis    "all over"   Atherosclerosis of aorta [facility])    Atrial  fibrillation ([place])    Back pain    Complication of anesthesia    "I woke up during 2 different procedures" (60/06/3709)   Diastolic dysfunction    Frequent UTI    GERD (gastroesophageal reflux disease)    Hypertension    Hypertriglyceridemia    Insulin resistance    Long term current use of anticoagulant    Multiple lung nodules    Muscular deconditioning    MVP (mitral valve prolapse)    Obesity    OSA on CPAP    Osteoarthritis    Osteopenia    unspecified location   Pacemaker    PAF (paroxysmal atrial fibrillation) (HCC)    Positive ANA (antinuclear antibody)    Presbyesophagus    Presence of permanent cardiac pacemaker    Primary osteoarthritis of both hands    neck and spine and hips   Pulmonary fibrosis ([place])    followed by pulmonolgy   PVC's (premature ventricular contractions)    Shortness of breath    Sinus arrest 10/2014   s/p Medtronic Advisa model A2DR01 serial number [id number]   Sleep apnea    Stroke ([place]) 01/2014   denies deficits on 10/04/2014   Tachy-brady syndrome ([place]) 10/2014   TIA (transient ischemic attack)    Vitamin D deficiency     Past Surgical History:  Procedure Laterality Date   CARDIOVERSION N/A 09/02/2017   Procedure: CARDIOVERSION;  Surgeon: Sanda Klein, MD;  Location: [facility];  Service: Cardiovascular;  Laterality: N/A;   COLONOSCOPY  2019   ESOPHAGEAL DILATION     EXCISION VAGINAL CYST     benign nodules   INSERT / REPLACE / REMOVE PACEMAKER  10/04/2014   Medtronic Advisa model A2DR01 serial number [id number]   LOOP RECORDER EXPLANT N/A 10/04/2014   Procedure: LOOP RECORDER EXPLANT;  Surgeon: Sanda Klein, MD;  Location: [facility] CATH LAB;  Service: Cardiovascular;  Laterality: N/A;   LOOP RECORDER IMPLANT N/A 04/19/2014   Procedure: LOOP RECORDER IMPLANT;  Surgeon: Sanda Klein, MD;  Location: [facility] C[facility] LAB;  Service: Cardiovascular;  Laterality: N/A;   NM MYOCAR PERF WALL MOTION  12/18/2007   normal   PERMANENT PACEMAKER  INSERTION N/A 10/04/2014   Procedure: PERMANENT PACEMAKER INSERTION;  Surgeon: Sanda Klein, MD;  Location: [facility] CATH LAB;  Service: Cardiovascular;  Laterality: N/A;   TUBAL LIGATION     US ECHOCARDIOGRAPHY  05/15/2010   LA mildly dilated,mild mitral annular ca+, AOV mildly sclerotic, mild asymmetric LVH    Current Medications: Outpatient Medications Prior to Visit  Medication Sig Dispense Refill   apixaban (ELIQUIS) 5 MG TABS tablet Take 1 tablet (5 mg total) by mouth 2 (two) times daily. 180 tablet 1   diazepam (VALIUM) 5 MG tablet TAKE 1 TABLET EACH DAY. 30 tablet 0   dofetilide (TIKOSYN) 500 MCG capsule Take 1 capsule (500 mcg total) by mouth 2 (two) times daily. 180 capsule 2   Magnesium 400 MG CAPS Take 400 mg by mouth daily. 30 capsule 0   metoprolol tartrate (LOPRESSOR) 25 MG tablet Take 1 tablet (25 mg total) by mouth 2 (two) times daily. 180 tablet 3   Multiple Vitamins-Minerals (ZINC PO) Take 1 tablet by mouth daily.     Nintedanib (OFEV) 100 MG CAPS Take 1 capsule (100 mg  total) by mouth 2 (two) times daily. 180 capsule 3   omeprazole (PRILOSEC) 40 MG capsule Take 1 capsule by mouth daily.     sertraline (ZOLOFT) 100 MG tablet TAKE 1 TABLET EACH DAY. 90 tablet 0   Acetaminophen (TYLENOL ARTHRITIS PAIN PO) Take 2 tablets by mouth daily as needed (For pain). (Patient not taking: Reported on 06/10/2022)     B Complex-C (SUPER B COMPLEX PO) Take 1 tablet by mouth daily. (Patient not taking: Reported on 06/10/2022)     Biotin 5000 MCG CAPS Take 5,000 mcg by mouth daily. (Patient not taking: Reported on 06/10/2022)     Calcium Carbonate-Vit D-Min (CALCIUM 1200 PO) Take 1,200 mg by mouth daily.  (Patient not taking: Reported on 06/10/2022)     cholecalciferol (VITAMIN D) 1000 UNITS tablet Take 6,000 Units by mouth daily.  (Patient not taking: Reported on 06/10/2022)     Co-Enzyme Q10 100 MG CAPS Take 100 mg by mouth daily.  (Patient not taking: Reported on 06/10/2022)     fluticasone (FLONASE)  50 MCG/ACT nasal spray Place 2 sprays into both nostrils daily as needed for allergies or rhinitis. (Patient not taking: Reported on 0/22/3361)     folic acid (FOLVITE) 224 MCG tablet Take 400 mcg by mouth daily. (Patient not taking: Reported on 06/10/2022)     glucosamine-chondroitin 500-400 MG tablet Take 1 tablet by mouth 2 (two) times daily.  (Patient not taking: Reported on 06/10/2022)     loratadine (CLARITIN) 10 MG tablet Take 10 mg by mouth daily. (Patient not taking: Reported on 06/10/2022)     nitroGLYCERIN (NITROSTAT) 0.4 MG SL tablet Place 1 tablet (0.4 mg total) under the tongue every 5 (five) minutes as needed for chest pain. (Patient not taking: Reported on 06/10/2022) 25 tablet 1   No facility-administered medications prior to visit.     Allergies:   Cefazolin, Pseudoephedrine hcl, Ergotamine, Other, Pentobarbital, Pirfenidone, and Caffeine   Social History   Socioeconomic History   Marital status: Divorced    Spouse name: Not on file   Number of children: 3   Years of education: college   Highest education level: Not on file  Occupational History   Occupation: Retired Engineer, site  Tobacco Use   Smoking status: Former    Packs/day: 2.00    Years: 18.00    Total pack years: 36.00    Types: Cigarettes    Quit date: 07/18/1970    Years since quitting: 51.9   Smokeless tobacco: Never   Tobacco comments:    Former smoker quit smoking in [place] Use   Vaping Use: Never used  Substance and Sexual Activity   Alcohol use: Yes    Alcohol/week: 0.0 standard drinks of alcohol    Comment: 10/04/2014 "might have a drink a couple times/yr"   Drug use: No   Sexual activity: Never  Other Topics Concern   Not on file  Social History Narrative   Lives at the [facility]    Patient is right handed.   Patient has a college degree.   Patient is retired.      Social History      Diet? Awful sweets are a terrible addiction       Do you drink/eat things with  caffeine? some      Marital status?          divorced  What year were you married? 1960      Do you live in a house, apartment, assisted living, condo, trailer, etc.? [place]       Is it one or more stories? 1st level      How many persons live in your home? 1      Do you have any pets in your home? (please list) no       Highest level of education completed? College graduate      Current or past profession: realtor       Do you exercise?             Not much                          Type & how often? Barely any  - bed exercise for injured knee      Advanced Directives      Do you have a living will? yes      Do you have a DNR form?                                  If not, do you want to discuss one?yes      Do you have signed POA/HPOA for forms? yes      Functional Status      Do you have difficulty bathing or dressing yourself? No       Do you have difficulty preparing food or eating? No       Do you have difficulty managing your medications? No       Do you have difficulty managing your finances? No       Do you have difficulty affording your medications? No  Except eloquist after donut hole - so far I have been able to get samples from cardiologist      Social Determinants of Health   Financial Resource Strain: Not on file  Food Insecurity: Not on file  Transportation Needs: Not on file  Physical Activity: Not on file  Stress: Not on file  Social Connections: Not on file     Family History:  The patient's family history includes Alcoholism in her brother; Alpha-1 antitrypsin deficiency in her daughter; Arthritis in her son; Breast cancer in her daughter; Diabetes in her son; Healthy in her brother, daughter, and sister; Heart attack in her mother; Heart failure in her father; Prostate cancer in her brother, brother, and brother; Stroke in her brother, brother, and father; Sudden death in her mother.   ROS:   Please see the history  of present illness.    All other systems are reviewed and are negative.   PHYSICAL EXAM:   VS:  BP 108/64 (BP Location: Left Arm, Patient Position: Sitting, Cuff Size: Large)   Pulse 87   Ht _0  (1.702 m)   Wt 276 lb 12.8 oz (125.6 kg)   LMP  (LMP Unknown)   SpO2 96%   BMI 43.35 kg/m      General: Alert, oriented x3, no distress, morbidly obese.  Healthy left subclavian pacemaker site Head: no evidence of trauma, PERRL, EOMI, no exophtalmos or lid lag, no myxedema, no xanthelasma; normal ears, nose and oropharynx Neck: normal jugular venous pulsations and no hepatojugular reflux; brisk carotid pulses without delay and no carotid bruits Chest: clear to auscultation, no signs of consolidation by percussion or palpation, normal  fremitus, symmetrical and full respiratory excursions Cardiovascular: normal position and quality of the apical impulse, regular rhythm, normal first and second heart sounds, no murmurs, rubs or gallops Abdomen: no tenderness or distention, no masses by palpation, no abnormal pulsatility or arterial bruits, normal bowel sounds, no hepatosplenomegaly Extremities: no clubbing, cyanosis or edema; 2+ radial, ulnar and brachial pulses bilaterally; 2+ right femoral, posterior tibial and dorsalis pedis pulses; 2+ left femoral, posterior tibial and dorsalis pedis pulses; no subclavian or femoral bruits Neurological: grossly nonfocal Psych: Normal mood and affect     Wt Readings from Last 3 Encounters:  06/10/22 276 lb 12.8 oz (125.6 kg)  05/31/22 276 lb 9.6 oz (125.5 kg)  03/15/22 273 lb 12.8 oz (124.2 kg)      Studies/Labs Reviewed:   ECHO 05/18/2019   1. The left ventricle has low normal systolic function, with an ejection fraction of 50-55%. The cavity size was normal. Left ventricular diastolic function could not be evaluated.  2. The right ventricle has normal systolic function. The cavity was normal.  3. The mitral valve is grossly normal.  4. The  tricuspid valve is grossly normal.  5. The aortic valve is tricuspid. Mild thickening of the aortic valve. No stenosis of the aortic valve.  6. Normal LV systolic function; no significant valvular heart disease.  EKG:  EKG is ordered today.  Personally reviewed ECG from 03/28/2022 that shows atrial paced, ventricular sensed rhythm, narrow QRS complex, QTc 441 ms. Recent Labs: 03/28/2022: Magnesium 2.1 05/31/2022: ALT 13; Brain Natriuretic Peptide 134; BUN 18; Creat 1.11; Hemoglobin 15.1; Platelets 265; Potassium 4.3; Sodium 138   Lipid Panel    Component Value Date/Time   CHOL 195 07/28/2020 0913   CHOL 176 06/03/2018 1022   TRIG 133 07/28/2020 0913   HDL 55 07/28/2020 0913   HDL 53 06/03/2018 1022   CHOLHDL 3.5 07/28/2020 0913   VLDL 33.6 01/15/2017 1417   LDLCALC 115 (H) 07/28/2020 0913   LDLDIRECT 81.0 07/15/2016 1419    ASSESSMENT:    1. Paroxysmal atrial fibrillation (HCC)   2. SSS (sick sinus syndrome) ([place])   3. Pacemaker   4. PAH (pulmonary artery hypertension) ([place])   5. Chronic diastolic heart failure ([place])   6. Acquired thrombophilia ([place])   7. Morbid obesity ([place])   8. OSA (obstructive sleep apnea)   9. Fibrosis, idiopathic pulmonary (HCC)        PLAN:  In order of problems listed above:  AFib: Excellent response with dofetilide with evolution of atrial fibrillation over the last 4-5 months.  Most recent ECG shows QT interval in desirable range.  Normal potassium and creatinine levels recently.  Discussed the possibility of multiple drug interactions with dofetilide, need for testing of the QT interval and routine metabolic labs at least every 6 months.  CHADSVasc 5 (Age 23, gender, TIA).  On appropriate anticoagulation, without recent bleeding. SSS: Has over 80% atrial pacing with good heart rate histogram distribution for her level of activity. PPM: Normal device function.  Continue remote downloads every 3 months.  She is not pacemaker dependent but does have  some evidence of AV conduction abnormalities (atrial fibrillation rate control occur spontaneously, without medications). PAH: She has pulmonary hypertension due to combination of WHO group 2 and group 3 etiology (diastolic heart failure and interstitial lung disease).   CHF: Physical exam is challenging due to morbid obesity but there is no evidence of obvious hypervolemia at this time.  Her dyspnea has always been felt  to be multifactorial, related to obesity, lung fibrosis, COPD in addition to diastolic heart failure. BNP normal in 2018, but elevated in May 2022 (proBNP 289). Echo 2017 with normal EF, mild diastolic dysfunction without evidence of elevated filling pressures.  Echo performed in June 2020 showed similar findings, but diastolic function could not be adequately assessed.  If there is confusion regarding cause of worsening pulmonary hypertension, the best diagnostic test would be right heart catheterization. Anticoagulation: Compliant, denies bleeding problems. Obesity: She feels frustrated with inability to lose weight and questions any potential benefit she may have from weight loss at her age.  Discussed the fact that she may improve from a symptom point of view if her heart and lungs have to deliver oxygen to a smaller body size. OSA: Compliant with CPAP.  Denies daytime hypersomnolence. IPF:  On Ofev.  Notes results of recent high-resolution CT does suggest slight interval worsening.  Follow-up with Dr. Chase Caller.    Medication Adjustments/Labs and Tests Ordered: Current medicines are reviewed at length with the patient today.  Concerns regarding medicines are outlined above.  Medication changes, Labs and Tests ordered today are listed in the Patient Instructions below. Patient Instructions  Medication Instructions:  No changes *If you need a refill on your cardiac medications before your next appointment, please call your pharmacy*   Lab Work: None ordered If you have labs  (blood work) drawn today and your tests are completely normal, you will receive your results only by: [place] (if you have MyChart) OR A paper copy in the mail If you have any lab test that is abnormal or we need to change your treatment, we will call you to review the results.   Testing/Procedures: None ordered   Follow-Up: At [facility], you and your health needs are our priority.  As part of our continuing mission to provide you with exceptional heart care, we have created designated Provider Care Teams.  These Care Teams include your primary Cardiologist (physician) and Advanced Practice Providers (APPs -  Physician Assistants and Nurse Practitioners) who all work together to provide you with the care you need, when you need it.  We recommend signing up for the patient portal called "MyChart".  Sign up information is provided on this After Visit Summary.  MyChart is used to connect with patients for Virtual Visits (Telemedicine).  Patients are able to view lab/test results, encounter notes, upcoming appointments, etc.  Non-urgent messages can be sent to your provider as well.   To learn more about what you can do with MyChart, go to [email or web address].    Your next appointment:   6 month(s)  The format for your next appointment:   In Person  Provider:   Sanda Klein, MD {    Important Information About Sugar         Signed, Sanda Klein, MD  06/11/2022 9:37 AM    [address], [place], [place]  [address] Phone: [phone]; Fax: [phone]

## 2022-06-11 ENCOUNTER — Encounter: Payer: Self-pay | Admitting: Cardiovascular Disease

## 2022-06-20 DIAGNOSIS — H43813 Vitreous degeneration, bilateral: Secondary | ICD-10-CM | POA: Diagnosis not present

## 2022-06-20 DIAGNOSIS — H52223 Regular astigmatism, bilateral: Secondary | ICD-10-CM | POA: Diagnosis not present

## 2022-06-20 DIAGNOSIS — H5203 Hypermetropia, bilateral: Secondary | ICD-10-CM | POA: Diagnosis not present

## 2022-06-20 DIAGNOSIS — H25813 Combined forms of age-related cataract, bilateral: Secondary | ICD-10-CM | POA: Diagnosis not present

## 2022-06-20 DIAGNOSIS — H524 Presbyopia: Secondary | ICD-10-CM | POA: Diagnosis not present

## 2022-06-24 ENCOUNTER — Telehealth: Payer: Self-pay | Admitting: Internal Medicine

## 2022-06-24 ENCOUNTER — Telehealth: Payer: Self-pay | Admitting: Cardiovascular Disease

## 2022-06-24 NOTE — Telephone Encounter (Signed)
Called and left voicemail for patient to call office back in regards to questions about her medications

## 2022-06-24 NOTE — Telephone Encounter (Signed)
[name]- aware samples available for pick   Lot # [id number]  EXP APR 2025 X2  Patient verbalized understanding

## 2022-06-24 NOTE — Telephone Encounter (Signed)
She has slow progressive disease over 2 years . She is not on aresearch medication. She is on ofev that has been approved since 2014. Despite that she is progressing . Without it she will progress more. If she is tolerating ofev she should continue it. Is she having side effects from the low dose ofev?

## 2022-06-24 NOTE — Telephone Encounter (Signed)
Patient calling the office for samples of medication:   1.  What medication and dosage are you requesting samples for?   apixaban (ELIQUIS) 5 MG TABS tablet    2.  Are you currently out of this medication? Yes, pt states she is completely out of this medication. She states she has been approved for the patient assistance on the medication but they haven't sent in a prescription yet. She is requesting samples until her full supply.

## 2022-06-24 NOTE — Telephone Encounter (Signed)
Ok to leave 2 weeks of Eliquis 13m BID samples for patient

## 2022-06-24 NOTE — Telephone Encounter (Signed)
Patient is returning phone call. Patient phone number is [phone].

## 2022-06-24 NOTE — Telephone Encounter (Signed)
Called patient and she states that at last OV MR stated that chest xray showed disease is progressing. Patient wants to know if the medication she is on is part of a study that had failed? Should she get off of medication all together or what does MR suggest?  MR please advise

## 2022-06-25 NOTE — Telephone Encounter (Signed)
Called and spoke to patient. And she states no issues noted so far with ofev. Nothing further needed

## 2022-07-01 NOTE — Telephone Encounter (Signed)
Assistance approved through 12/01/22

## 2022-07-04 DIAGNOSIS — M545 Low back pain, unspecified: Secondary | ICD-10-CM | POA: Diagnosis not present

## 2022-07-04 DIAGNOSIS — M6281 Muscle weakness (generalized): Secondary | ICD-10-CM | POA: Diagnosis not present

## 2022-07-04 DIAGNOSIS — M25511 Pain in right shoulder: Secondary | ICD-10-CM | POA: Diagnosis not present

## 2022-07-04 DIAGNOSIS — R2681 Unsteadiness on feet: Secondary | ICD-10-CM | POA: Diagnosis not present

## 2022-07-09 DIAGNOSIS — M6281 Muscle weakness (generalized): Secondary | ICD-10-CM | POA: Diagnosis not present

## 2022-07-09 DIAGNOSIS — M25511 Pain in right shoulder: Secondary | ICD-10-CM | POA: Diagnosis not present

## 2022-07-09 DIAGNOSIS — R2681 Unsteadiness on feet: Secondary | ICD-10-CM | POA: Diagnosis not present

## 2022-07-09 DIAGNOSIS — M545 Low back pain, unspecified: Secondary | ICD-10-CM | POA: Diagnosis not present

## 2022-07-11 DIAGNOSIS — R2681 Unsteadiness on feet: Secondary | ICD-10-CM | POA: Diagnosis not present

## 2022-07-11 DIAGNOSIS — M6281 Muscle weakness (generalized): Secondary | ICD-10-CM | POA: Diagnosis not present

## 2022-07-11 DIAGNOSIS — M25511 Pain in right shoulder: Secondary | ICD-10-CM | POA: Diagnosis not present

## 2022-07-11 DIAGNOSIS — M545 Low back pain, unspecified: Secondary | ICD-10-CM | POA: Diagnosis not present

## 2022-07-15 NOTE — Telephone Encounter (Signed)
Seems like encounter was open in error so closing encounter.  

## 2022-07-16 DIAGNOSIS — M6281 Muscle weakness (generalized): Secondary | ICD-10-CM | POA: Diagnosis not present

## 2022-07-16 DIAGNOSIS — M545 Low back pain, unspecified: Secondary | ICD-10-CM | POA: Diagnosis not present

## 2022-07-16 DIAGNOSIS — M25511 Pain in right shoulder: Secondary | ICD-10-CM | POA: Diagnosis not present

## 2022-07-16 DIAGNOSIS — R2681 Unsteadiness on feet: Secondary | ICD-10-CM | POA: Diagnosis not present

## 2022-07-18 ENCOUNTER — Ambulatory Visit (HOSPITAL_COMMUNITY)
Admission: RE | Admit: 2022-07-18 | Discharge: 2022-07-18 | Disposition: A | Discharge status: Home / Self Care | Payer: Medicare Other | Source: Ambulatory Visit | Attending: Physician Assistant | Admitting: Physician Assistant

## 2022-07-18 ENCOUNTER — Encounter (HOSPITAL_COMMUNITY): Payer: Self-pay | Admitting: Physician Assistant

## 2022-07-18 VITALS — BP 136/80 | HR 69 | Ht 67.0 in | Wt 276.4 lb

## 2022-07-18 DIAGNOSIS — I480 Paroxysmal atrial fibrillation: Secondary | ICD-10-CM | POA: Insufficient documentation

## 2022-07-18 DIAGNOSIS — Z7901 Long term (current) use of anticoagulants: Secondary | ICD-10-CM | POA: Insufficient documentation

## 2022-07-18 DIAGNOSIS — I700 Atherosclerosis of aorta: Secondary | ICD-10-CM | POA: Insufficient documentation

## 2022-07-18 DIAGNOSIS — Z6841 Body Mass Index (BMI) 40.0 and over, adult: Secondary | ICD-10-CM | POA: Diagnosis not present

## 2022-07-18 DIAGNOSIS — D6859 Other primary thrombophilia: Secondary | ICD-10-CM | POA: Insufficient documentation

## 2022-07-18 DIAGNOSIS — I495 Sick sinus syndrome: Secondary | ICD-10-CM | POA: Insufficient documentation

## 2022-07-18 DIAGNOSIS — J84112 Idiopathic pulmonary fibrosis: Secondary | ICD-10-CM | POA: Insufficient documentation

## 2022-07-18 DIAGNOSIS — M25511 Pain in right shoulder: Secondary | ICD-10-CM | POA: Diagnosis not present

## 2022-07-18 DIAGNOSIS — I5030 Unspecified diastolic (congestive) heart failure: Secondary | ICD-10-CM | POA: Insufficient documentation

## 2022-07-18 DIAGNOSIS — E669 Obesity, unspecified: Secondary | ICD-10-CM | POA: Insufficient documentation

## 2022-07-18 DIAGNOSIS — M545 Low back pain, unspecified: Secondary | ICD-10-CM | POA: Diagnosis not present

## 2022-07-18 DIAGNOSIS — D6869 Other thrombophilia: Secondary | ICD-10-CM

## 2022-07-18 DIAGNOSIS — Z950 Presence of cardiac pacemaker: Secondary | ICD-10-CM | POA: Diagnosis not present

## 2022-07-18 DIAGNOSIS — G4733 Obstructive sleep apnea (adult) (pediatric): Secondary | ICD-10-CM | POA: Insufficient documentation

## 2022-07-18 DIAGNOSIS — I341 Nonrheumatic mitral (valve) prolapse: Secondary | ICD-10-CM | POA: Diagnosis not present

## 2022-07-18 DIAGNOSIS — Z8673 Personal history of transient ischemic attack (TIA), and cerebral infarction without residual deficits: Secondary | ICD-10-CM | POA: Diagnosis not present

## 2022-07-18 DIAGNOSIS — R2681 Unsteadiness on feet: Secondary | ICD-10-CM | POA: Diagnosis not present

## 2022-07-18 DIAGNOSIS — M6281 Muscle weakness (generalized): Secondary | ICD-10-CM | POA: Diagnosis not present

## 2022-07-18 LAB — BASIC METABOLIC PANEL
Anion gap: 5 (ref 5–15)
BUN: 16 mg/dL (ref 8–23)
CO2: 26 mmol/L (ref 22–32)
Calcium: 8.8 mg/dL — ABNORMAL LOW (ref 8.9–10.3)
Chloride: 106 mmol/L (ref 98–111)
Creatinine, Ser: 1.16 mg/dL — ABNORMAL HIGH (ref 0.44–1.00)
GFR, Estimated: 47 mL/min — ABNORMAL LOW (ref >60)
Glucose, Bld: 130 mg/dL — ABNORMAL HIGH (ref 70–99)
Potassium: 4.3 mmol/L (ref 3.5–5.1)
Sodium: 137 mmol/L (ref 135–145)

## 2022-07-18 LAB — MAGNESIUM: Magnesium: 2.1 mg/dL (ref 1.7–2.4)

## 2022-07-18 NOTE — Progress Notes (Signed)
Primary Care Physician: [name], MD Primary Cardiologist: Dr Sallyanne Kuster Primary Electrophysiologist: Dr Curt Bears Referring Physician: Dr Sherryle Lis is a 81 y.o. female with a history of tachybradycardia syndrome s/p PPM, diastolic CHF, MVP, OSA, idiopathic pulmonary fibrosis, CVA, aortic atherosclerosis, atrial fibrillation who presents for follow up in the [facility]. Patient is on Eliquis for a CHADS2VASC score of 8. Her afib burden was noted to be increasing on her device and she was referred to EP for evaluation. She was not felt to be a good ablation candidate and dofetilide was recommended. She is s/p dofetilide admission 4/4-03/08/22.  On follow up today, patient reports that she has done well since her last visit. Her last remote pacer check in May showed only 2% afib burden. She denies any bleeding issues on anticoagulation. In SR today. She has been doing swimming/water aerobics for physical activity.   Today, she denies symptoms of palpitations, chest pain,  orthopnea, PND, lower extremity edema, dizziness, presyncope, syncope, bleeding, or neurologic sequela. The patient is tolerating medications without difficulties and is otherwise without complaint today.    Atrial Fibrillation Risk Factors:  she does have symptoms or diagnosis of sleep apnea. she is compliant with CPAP therapy. she does not have a history of rheumatic fever.   she has a BMI of Body mass index is 43.29 kg/m.Marland Kitchen Filed Weights   07/18/22 1357  Weight: 125.4 kg    Family History  Problem Relation Age of Onset   Heart attack Mother    Sudden death Mother    Stroke Father    Heart failure Father    Alcoholism Brother    Healthy Sister    Stroke Brother    Alpha-1 antitrypsin deficiency Daughter    Breast cancer Daughter    Prostate cancer Brother    Stroke Brother    Prostate cancer Brother    Healthy Brother    Prostate cancer Brother    Healthy  Daughter    Diabetes Son    Arthritis Son      Atrial Fibrillation Management history:  Previous antiarrhythmic drugs: dofetilide  Previous cardioversions: 2018 Previous ablations: none CHADS2VASC score: 8 Anticoagulation history: Eliquis   Past Medical History:  Diagnosis Date   Alpha-1-antitrypsin deficiency carrier    Arthritis    "all over"   Atherosclerosis of aorta ([place])    Atrial fibrillation ([place])    Back pain    Complication of anesthesia    "I woke up during 2 different procedures" (73/06/1061)   Diastolic dysfunction    Frequent UTI    GERD (gastroesophageal reflux disease)    Hypertension    Hypertriglyceridemia    Insulin resistance    Long term current use of anticoagulant    Multiple lung nodules    Muscular deconditioning    MVP (mitral valve prolapse)    Obesity    OSA on CPAP    Osteoarthritis    Osteopenia    unspecified location   Pacemaker    PAF (paroxysmal atrial fibrillation) (HCC)    Positive ANA (antinuclear antibody)    Presbyesophagus    Presence of permanent cardiac pacemaker    Primary osteoarthritis of both hands    neck and spine and hips   Pulmonary fibrosis ([place])    followed by pulmonolgy   PVC's (premature ventricular contractions)    Shortness of breath    Sinus arrest 10/2014   s/p Medtronic Advisa model A2DR01 serial  number [id number]   Sleep apnea    Stroke ([place]) 01/2014   denies deficits on 10/04/2014   Tachy-brady syndrome ([place]) 10/2014   TIA (transient ischemic attack)    Vitamin D deficiency    Past Surgical History:  Procedure Laterality Date   CARDIOVERSION N/A 09/02/2017   Procedure: CARDIOVERSION;  Surgeon: Sanda Klein, MD;  Location: [facility];  Service: Cardiovascular;  Laterality: N/A;   COLONOSCOPY  2019   ESOPHAGEAL DILATION     EXCISION VAGINAL CYST     benign nodules   INSERT / REPLACE / REMOVE PACEMAKER  10/04/2014   Medtronic Advisa model A2DR01 serial number [id number]   LOOP RECORDER  EXPLANT N/A 10/04/2014   Procedure: LOOP RECORDER EXPLANT;  Surgeon: Sanda Klein, MD;  Location: [facility] CATH LAB;  Service: Cardiovascular;  Laterality: N/A;   LOOP RECORDER IMPLANT N/A 04/19/2014   Procedure: LOOP RECORDER IMPLANT;  Surgeon: Sanda Klein, MD;  Location: [facility] CATH LAB;  Service: Cardiovascular;  Laterality: N/A;   NM MYOCAR PERF WALL MOTION  12/18/2007   normal   PERMANENT PACEMAKER INSERTION N/A 10/04/2014   Procedure: PERMANENT PACEMAKER INSERTION;  Surgeon: Sanda Klein, MD;  Location: [facility] CATH LAB;  Service: Cardiovascular;  Laterality: N/A;   TUBAL LIGATION     US ECHOCARDIOGRAPHY  05/15/2010   LA mildly dilated,mild mitral annular ca+, AOV mildly sclerotic, mild asymmetric LVH    Current Outpatient Medications  Medication Sig Dispense Refill   apixaban (ELIQUIS) 5 MG TABS tablet Take 1 tablet (5 mg total) by mouth 2 (two) times daily. 180 tablet 1   diazepam (VALIUM) 5 MG tablet TAKE 1 TABLET EACH DAY. 30 tablet 0   dofetilide (TIKOSYN) 500 MCG capsule Take 1 capsule (500 mcg total) by mouth 2 (two) times daily. 180 capsule 2   Magnesium 400 MG CAPS Take 400 mg by mouth daily. 30 capsule 0   metoprolol tartrate (LOPRESSOR) 25 MG tablet Take 1 tablet (25 mg total) by mouth 2 (two) times daily. 180 tablet 3   Nintedanib (OFEV) 100 MG CAPS Take 1 capsule (100 mg total) by mouth 2 (two) times daily. 180 capsule 3   nitroGLYCERIN (NITROSTAT) 0.4 MG SL tablet Place 1 tablet (0.4 mg total) under the tongue every 5 (five) minutes as needed for chest pain. 25 tablet 1   omeprazole (PRILOSEC) 40 MG capsule Take 1 capsule by mouth daily.     sertraline (ZOLOFT) 100 MG tablet TAKE 1 TABLET EACH DAY. 90 tablet 0   No current facility-administered medications for this encounter.    Allergies  Allergen Reactions   Cefazolin Hives and Itching    Other reaction(s): severe hives   Pseudoephedrine Hcl     Other reaction(s): palpitations (moderate)   Ergotamine     unknown   Other  Other (See Comments)   Pentobarbital     unknown   Pirfenidone     unknown   Caffeine Palpitations    Social History   Socioeconomic History   Marital status: Divorced    Spouse name: Not on file   Number of children: 3   Years of education: college   Highest education level: Not on file  Occupational History   Occupation: Retired Engineer, site  Tobacco Use   Smoking status: Former    Packs/day: 2.00    Years: 18.00    Total pack years: 36.00    Types: Cigarettes    Quit date: 07/18/1970    Years since quitting: 26.0  Smokeless tobacco: Never   Tobacco comments:    Former smoker 07/18/22 quit smoking in 1971  Vaping Use   Vaping Use: Never used  Substance and Sexual Activity   Alcohol use: Yes    Alcohol/week: 0.0 standard drinks of alcohol    Comment: 10/04/2014 "might have a drink a couple times/yr"   Drug use: No   Sexual activity: Never  Other Topics Concern   Not on file  Social History Narrative   Lives at the [facility]    Patient is right handed.   Patient has a college degree.   Patient is retired.      Social History      Diet? Awful sweets are a terrible addiction       Do you drink/eat things with caffeine? some      Marital status?          divorced                          What year were you married? 1960      Do you live in a house, apartment, assisted living, condo, trailer, etc.? Friends [place]       Is it one or more stories? 1st level      How many persons live in your home? 1      Do you have any pets in your home? (please list) no       Highest level of education completed? College graduate      Current or past profession: realtor       Do you exercise?             Not much                          Type & how often? Barely any  - bed exercise for injured knee      Advanced Directives      Do you have a living will? yes      Do you have a DNR form?                                  If not, do you want to discuss one?yes       Do you have signed POA/HPOA for forms? yes      Functional Status      Do you have difficulty bathing or dressing yourself? No       Do you have difficulty preparing food or eating? No       Do you have difficulty managing your medications? No       Do you have difficulty managing your finances? No       Do you have difficulty affording your medications? No  Except eloquist after donut hole - so far I have been able to get samples from cardiologist      Social Determinants of Health   Financial Resource Strain: Not on file  Food Insecurity: Not on file  Transportation Needs: Not on file  Physical Activity: Not on file  Stress: Not on file  Social Connections: Not on file  Intimate Partner Violence: Not on file     ROS- All systems are reviewed and negative except as per the HPI above.  Physical Exam: Vitals:   07/18/22 1357  BP: 136/80  Pulse: 69  Weight: 125.4  kg  Height: _0  (1.702 m)     GEN- The patient is a well appearing elderly obese female, alert and oriented x 3 today.   HEENT-head normocephalic, atraumatic, sclera clear, conjunctiva pink, hearing intact, trachea midline. Lungs- Clear to ausculation bilaterally, normal work of breathing Heart- Regular rate and rhythm, no murmurs, rubs or gallops  GI- soft, NT, ND, + BS Extremities- no clubbing, cyanosis, or edema MS- no significant deformity or atrophy Skin- no rash or lesion Psych- euthymic mood, full affect Neuro- strength and sensation are intact   Wt Readings from Last 3 Encounters:  07/18/22 125.4 kg  06/10/22 125.6 kg  05/31/22 125.5 kg    EKG today demonstrates  A paced rhythm Vent. rate 69 BPM PR interval 246 ms QRS duration 74 ms QT/QTcB 408/437 ms  Echo 05/18/19 demonstrated   1. The left ventricle has low normal systolic function, with an ejection  fraction of 50-55%. The cavity size was normal. Left ventricular diastolic function could not be evaluated.   2. The right  ventricle has normal systolic function. The cavity was  normal.   3. The mitral valve is grossly normal.   4. The tricuspid valve is grossly normal.   5. The aortic valve is tricuspid. Mild thickening of the aortic valve. No  stenosis of the aortic valve.   6. Normal LV systolic function; no significant valvular heart disease.   Epic records are reviewed at length today  CHA2DS2-VASc Score = 8  The patient's score is based upon: CHF History: 1 HTN History: 1 Diabetes History: 0 Stroke History: 2 Vascular Disease History: 1 Age Score: 2 Gender Score: 1       ASSESSMENT AND PLAN: 1. Paroxysmal Atrial Fibrillation (ICD10:  I48.0) The patient's CHA2DS2-VASc score is 8, indicating a 10.8% annual risk of stroke.   S/p dofetilide admission 03/2022 Patient appears to be maintaining SR. Continue Eliquis 5 mg BID  Continue dofetilide 500 mcg BID. QT stable.  Check Bmet/mag today  Continue Lopressor 25 mg BID  2. Secondary Hypercoagulable State (ICD10:  D68.69) The patient is at significant risk for stroke/thromboembolism based upon her CHA2DS2-VASc Score of 8.  Continue Apixaban (Eliquis).   3. Obesity Body mass index is 43.29 kg/m. Lifestyle modification was discussed and encouraged including regular physical activity and weight reduction.  4. Obstructive sleep apnea Encouraged compliance with CPAP therapy.  5. SSS/tachybradycardia syndrome S/p PPM, followed by Dr Sallyanne Kuster and the device clinic.  6. IPF Followed by Dr Chase Caller Avoiding amiodarone.   Follow up with Dr Sallyanne Kuster per recall. AF clinic in 6 months.    [facility] [address] [place], [place] [address] [phone]

## 2022-07-19 DIAGNOSIS — K219 Gastro-esophageal reflux disease without esophagitis: Secondary | ICD-10-CM | POA: Diagnosis not present

## 2022-07-19 DIAGNOSIS — F331 Major depressive disorder, recurrent, moderate: Secondary | ICD-10-CM | POA: Diagnosis not present

## 2022-07-19 DIAGNOSIS — I10 Essential (primary) hypertension: Secondary | ICD-10-CM | POA: Diagnosis not present

## 2022-07-19 DIAGNOSIS — I480 Paroxysmal atrial fibrillation: Secondary | ICD-10-CM | POA: Diagnosis not present

## 2022-07-22 ENCOUNTER — Telehealth: Payer: Self-pay

## 2022-07-22 NOTE — Telephone Encounter (Signed)
Will route to pharm for input on anticoag. Last OV 07/18/22 with AF clinic and 06/10/22 with Dr. Sallyanne Kuster.

## 2022-07-22 NOTE — Telephone Encounter (Signed)
   Pre-operative Risk Assessment    Patient Name: [name] [name]  DOB: [birth year] MRN: [id number]     Request for Surgical Clearance    Procedure:  Endoscopy   Date of Surgery:  Clearance TBD                                 Surgeon:  Dr. Therisa Doyne  Surgeon's Group or Practice Name:  [facility] Phone number:  [phone] Fax number:  [phone]   Type of Clearance Requested:   - Pharmacy:  Hold Apixaban (Eliquis)     Type of Anesthesia:   Propofol      Signed, Mendel Ryder   07/22/2022, 4:49 PM

## 2022-07-23 DIAGNOSIS — R2681 Unsteadiness on feet: Secondary | ICD-10-CM | POA: Diagnosis not present

## 2022-07-23 DIAGNOSIS — M545 Low back pain, unspecified: Secondary | ICD-10-CM | POA: Diagnosis not present

## 2022-07-23 DIAGNOSIS — M6281 Muscle weakness (generalized): Secondary | ICD-10-CM | POA: Diagnosis not present

## 2022-07-23 DIAGNOSIS — M25511 Pain in right shoulder: Secondary | ICD-10-CM | POA: Diagnosis not present

## 2022-07-23 NOTE — Telephone Encounter (Signed)
     Primary Cardiologist: Sanda Klein, MD  Chart reviewed as part of pre-operative protocol coverage. Given past medical history and time since last visit, based on ACC/AHA guidelines, [name] [name] would be at acceptable risk for the planned procedure without further cardiovascular testing.   Patient with diagnosis of atrial fibrillation on Eliquis for anticoagulation.     Procedure: endoscopy Date of procedure: TBD     CHA2DS2-VASc Score = 8   This indicates a 10.8% annual risk of stroke. The patient's score is based upon: CHF History: 1 HTN History: 1 Diabetes History: 0 Stroke History: 2 Vascular Disease History: 1 Age Score: 2 Gender Score: 1   Stroke history - TIA in 2015, [facility] no residual deficits   CrCl 75 Platelet count 265   Per office protocol, patient can hold Eliquis for 1 days prior to procedure.   Patient will not need bridging with Lovenox (enoxaparin) around procedure.  I will route this recommendation to the requesting party via Epic fax function and remove from pre-op pool.  Please call with questions.  [name] NP-C     07/23/2022, 11:10 AM [address] Office [phone] Fax [phone]

## 2022-07-23 NOTE — Telephone Encounter (Signed)
Patient with diagnosis of atrial fibrillation on Eliquis for anticoagulation.    Procedure: endoscopy Date of procedure: TBD   CHA2DS2-VASc Score = 8   This indicates a 10.8% annual risk of stroke. The patient's score is based upon: CHF History: 1 HTN History: 1 Diabetes History: 0 Stroke History: 2 Vascular Disease History: 1 Age Score: 2 Gender Score: 1   Stroke history - TIA in 2015, evaluated at [facility], no residual deficits  CrCl 75 Platelet count 265  Per office protocol, patient can hold Eliquis for 1 days prior to procedure.   Patient will not need bridging with Lovenox (enoxaparin) around procedure.  **This guidance is not considered finalized until pre-operative APP has relayed final recommendations.**

## 2022-07-24 DIAGNOSIS — M545 Low back pain, unspecified: Secondary | ICD-10-CM | POA: Diagnosis not present

## 2022-07-24 DIAGNOSIS — M6281 Muscle weakness (generalized): Secondary | ICD-10-CM | POA: Diagnosis not present

## 2022-07-24 DIAGNOSIS — R2681 Unsteadiness on feet: Secondary | ICD-10-CM | POA: Diagnosis not present

## 2022-07-24 DIAGNOSIS — M25511 Pain in right shoulder: Secondary | ICD-10-CM | POA: Diagnosis not present

## 2022-07-25 DIAGNOSIS — R2681 Unsteadiness on feet: Secondary | ICD-10-CM | POA: Diagnosis not present

## 2022-07-25 DIAGNOSIS — M6281 Muscle weakness (generalized): Secondary | ICD-10-CM | POA: Diagnosis not present

## 2022-07-25 DIAGNOSIS — M25511 Pain in right shoulder: Secondary | ICD-10-CM | POA: Diagnosis not present

## 2022-07-25 DIAGNOSIS — M545 Low back pain, unspecified: Secondary | ICD-10-CM | POA: Diagnosis not present

## 2022-07-29 DIAGNOSIS — M545 Low back pain, unspecified: Secondary | ICD-10-CM | POA: Diagnosis not present

## 2022-07-29 DIAGNOSIS — M6281 Muscle weakness (generalized): Secondary | ICD-10-CM | POA: Diagnosis not present

## 2022-07-29 DIAGNOSIS — M25511 Pain in right shoulder: Secondary | ICD-10-CM | POA: Diagnosis not present

## 2022-07-29 DIAGNOSIS — R2681 Unsteadiness on feet: Secondary | ICD-10-CM | POA: Diagnosis not present

## 2022-07-30 DIAGNOSIS — R2681 Unsteadiness on feet: Secondary | ICD-10-CM | POA: Diagnosis not present

## 2022-07-30 DIAGNOSIS — M6281 Muscle weakness (generalized): Secondary | ICD-10-CM | POA: Diagnosis not present

## 2022-07-30 DIAGNOSIS — M545 Low back pain, unspecified: Secondary | ICD-10-CM | POA: Diagnosis not present

## 2022-07-30 DIAGNOSIS — M25511 Pain in right shoulder: Secondary | ICD-10-CM | POA: Diagnosis not present

## 2022-08-01 DIAGNOSIS — R2681 Unsteadiness on feet: Secondary | ICD-10-CM | POA: Diagnosis not present

## 2022-08-01 DIAGNOSIS — M6281 Muscle weakness (generalized): Secondary | ICD-10-CM | POA: Diagnosis not present

## 2022-08-01 DIAGNOSIS — M545 Low back pain, unspecified: Secondary | ICD-10-CM | POA: Diagnosis not present

## 2022-08-01 DIAGNOSIS — M25511 Pain in right shoulder: Secondary | ICD-10-CM | POA: Diagnosis not present

## 2022-08-06 DIAGNOSIS — M545 Low back pain, unspecified: Secondary | ICD-10-CM | POA: Diagnosis not present

## 2022-08-06 DIAGNOSIS — R2681 Unsteadiness on feet: Secondary | ICD-10-CM | POA: Diagnosis not present

## 2022-08-06 DIAGNOSIS — M25511 Pain in right shoulder: Secondary | ICD-10-CM | POA: Diagnosis not present

## 2022-08-06 DIAGNOSIS — M6281 Muscle weakness (generalized): Secondary | ICD-10-CM | POA: Diagnosis not present

## 2022-08-07 DIAGNOSIS — M25511 Pain in right shoulder: Secondary | ICD-10-CM | POA: Diagnosis not present

## 2022-08-07 DIAGNOSIS — R2681 Unsteadiness on feet: Secondary | ICD-10-CM | POA: Diagnosis not present

## 2022-08-07 DIAGNOSIS — M545 Low back pain, unspecified: Secondary | ICD-10-CM | POA: Diagnosis not present

## 2022-08-07 DIAGNOSIS — M6281 Muscle weakness (generalized): Secondary | ICD-10-CM | POA: Diagnosis not present

## 2022-08-08 DIAGNOSIS — M545 Low back pain, unspecified: Secondary | ICD-10-CM | POA: Diagnosis not present

## 2022-08-08 DIAGNOSIS — R2681 Unsteadiness on feet: Secondary | ICD-10-CM | POA: Diagnosis not present

## 2022-08-08 DIAGNOSIS — M25511 Pain in right shoulder: Secondary | ICD-10-CM | POA: Diagnosis not present

## 2022-08-08 DIAGNOSIS — M6281 Muscle weakness (generalized): Secondary | ICD-10-CM | POA: Diagnosis not present

## 2022-08-13 DIAGNOSIS — M6281 Muscle weakness (generalized): Secondary | ICD-10-CM | POA: Diagnosis not present

## 2022-08-13 DIAGNOSIS — M545 Low back pain, unspecified: Secondary | ICD-10-CM | POA: Diagnosis not present

## 2022-08-13 DIAGNOSIS — R2681 Unsteadiness on feet: Secondary | ICD-10-CM | POA: Diagnosis not present

## 2022-08-13 DIAGNOSIS — M25511 Pain in right shoulder: Secondary | ICD-10-CM | POA: Diagnosis not present

## 2022-08-14 DIAGNOSIS — M25511 Pain in right shoulder: Secondary | ICD-10-CM | POA: Diagnosis not present

## 2022-08-14 DIAGNOSIS — R2681 Unsteadiness on feet: Secondary | ICD-10-CM | POA: Diagnosis not present

## 2022-08-14 DIAGNOSIS — K222 Esophageal obstruction: Secondary | ICD-10-CM | POA: Diagnosis not present

## 2022-08-14 DIAGNOSIS — M6281 Muscle weakness (generalized): Secondary | ICD-10-CM | POA: Diagnosis not present

## 2022-08-14 DIAGNOSIS — K219 Gastro-esophageal reflux disease without esophagitis: Secondary | ICD-10-CM | POA: Diagnosis not present

## 2022-08-14 DIAGNOSIS — M545 Low back pain, unspecified: Secondary | ICD-10-CM | POA: Diagnosis not present

## 2022-08-15 DIAGNOSIS — M545 Low back pain, unspecified: Secondary | ICD-10-CM | POA: Diagnosis not present

## 2022-08-15 DIAGNOSIS — R2681 Unsteadiness on feet: Secondary | ICD-10-CM | POA: Diagnosis not present

## 2022-08-15 DIAGNOSIS — M25511 Pain in right shoulder: Secondary | ICD-10-CM | POA: Diagnosis not present

## 2022-08-15 DIAGNOSIS — M6281 Muscle weakness (generalized): Secondary | ICD-10-CM | POA: Diagnosis not present

## 2022-08-19 DIAGNOSIS — M6281 Muscle weakness (generalized): Secondary | ICD-10-CM | POA: Diagnosis not present

## 2022-08-19 DIAGNOSIS — M545 Low back pain, unspecified: Secondary | ICD-10-CM | POA: Diagnosis not present

## 2022-08-19 DIAGNOSIS — R2681 Unsteadiness on feet: Secondary | ICD-10-CM | POA: Diagnosis not present

## 2022-08-19 DIAGNOSIS — M25511 Pain in right shoulder: Secondary | ICD-10-CM | POA: Diagnosis not present

## 2022-08-20 DIAGNOSIS — M545 Low back pain, unspecified: Secondary | ICD-10-CM | POA: Diagnosis not present

## 2022-08-20 DIAGNOSIS — M6281 Muscle weakness (generalized): Secondary | ICD-10-CM | POA: Diagnosis not present

## 2022-08-20 DIAGNOSIS — R2681 Unsteadiness on feet: Secondary | ICD-10-CM | POA: Diagnosis not present

## 2022-08-20 DIAGNOSIS — M25511 Pain in right shoulder: Secondary | ICD-10-CM | POA: Diagnosis not present

## 2022-08-22 DIAGNOSIS — R2681 Unsteadiness on feet: Secondary | ICD-10-CM | POA: Diagnosis not present

## 2022-08-22 DIAGNOSIS — M25511 Pain in right shoulder: Secondary | ICD-10-CM | POA: Diagnosis not present

## 2022-08-22 DIAGNOSIS — M6281 Muscle weakness (generalized): Secondary | ICD-10-CM | POA: Diagnosis not present

## 2022-08-22 DIAGNOSIS — M545 Low back pain, unspecified: Secondary | ICD-10-CM | POA: Diagnosis not present

## 2022-08-26 DIAGNOSIS — M25511 Pain in right shoulder: Secondary | ICD-10-CM | POA: Diagnosis not present

## 2022-08-26 DIAGNOSIS — M545 Low back pain, unspecified: Secondary | ICD-10-CM | POA: Diagnosis not present

## 2022-08-26 DIAGNOSIS — M6281 Muscle weakness (generalized): Secondary | ICD-10-CM | POA: Diagnosis not present

## 2022-08-26 DIAGNOSIS — R2681 Unsteadiness on feet: Secondary | ICD-10-CM | POA: Diagnosis not present

## 2022-08-27 DIAGNOSIS — M6281 Muscle weakness (generalized): Secondary | ICD-10-CM | POA: Diagnosis not present

## 2022-08-27 DIAGNOSIS — M25511 Pain in right shoulder: Secondary | ICD-10-CM | POA: Diagnosis not present

## 2022-08-27 DIAGNOSIS — M545 Low back pain, unspecified: Secondary | ICD-10-CM | POA: Diagnosis not present

## 2022-08-27 DIAGNOSIS — R2681 Unsteadiness on feet: Secondary | ICD-10-CM | POA: Diagnosis not present

## 2022-08-29 DIAGNOSIS — H25043 Posterior subcapsular polar age-related cataract, bilateral: Secondary | ICD-10-CM | POA: Diagnosis not present

## 2022-08-29 DIAGNOSIS — M6281 Muscle weakness (generalized): Secondary | ICD-10-CM | POA: Diagnosis not present

## 2022-08-29 DIAGNOSIS — H353131 Nonexudative age-related macular degeneration, bilateral, early dry stage: Secondary | ICD-10-CM | POA: Diagnosis not present

## 2022-08-29 DIAGNOSIS — M25511 Pain in right shoulder: Secondary | ICD-10-CM | POA: Diagnosis not present

## 2022-08-29 DIAGNOSIS — H18413 Arcus senilis, bilateral: Secondary | ICD-10-CM | POA: Diagnosis not present

## 2022-08-29 DIAGNOSIS — H25013 Cortical age-related cataract, bilateral: Secondary | ICD-10-CM | POA: Diagnosis not present

## 2022-08-29 DIAGNOSIS — R2681 Unsteadiness on feet: Secondary | ICD-10-CM | POA: Diagnosis not present

## 2022-08-29 DIAGNOSIS — H2511 Age-related nuclear cataract, right eye: Secondary | ICD-10-CM | POA: Diagnosis not present

## 2022-08-29 DIAGNOSIS — M545 Low back pain, unspecified: Secondary | ICD-10-CM | POA: Diagnosis not present

## 2022-08-29 DIAGNOSIS — H2513 Age-related nuclear cataract, bilateral: Secondary | ICD-10-CM | POA: Diagnosis not present

## 2022-08-30 DIAGNOSIS — H2511 Age-related nuclear cataract, right eye: Secondary | ICD-10-CM | POA: Diagnosis not present

## 2022-08-30 DIAGNOSIS — H25043 Posterior subcapsular polar age-related cataract, bilateral: Secondary | ICD-10-CM | POA: Diagnosis not present

## 2022-08-30 DIAGNOSIS — H353131 Nonexudative age-related macular degeneration, bilateral, early dry stage: Secondary | ICD-10-CM | POA: Diagnosis not present

## 2022-08-30 DIAGNOSIS — H25013 Cortical age-related cataract, bilateral: Secondary | ICD-10-CM | POA: Diagnosis not present

## 2022-08-30 DIAGNOSIS — H2513 Age-related nuclear cataract, bilateral: Secondary | ICD-10-CM | POA: Diagnosis not present

## 2022-08-30 DIAGNOSIS — H18413 Arcus senilis, bilateral: Secondary | ICD-10-CM | POA: Diagnosis not present

## 2022-09-02 DIAGNOSIS — M25511 Pain in right shoulder: Secondary | ICD-10-CM | POA: Diagnosis not present

## 2022-09-02 DIAGNOSIS — M545 Low back pain, unspecified: Secondary | ICD-10-CM | POA: Diagnosis not present

## 2022-09-02 DIAGNOSIS — R2681 Unsteadiness on feet: Secondary | ICD-10-CM | POA: Diagnosis not present

## 2022-09-02 DIAGNOSIS — M6281 Muscle weakness (generalized): Secondary | ICD-10-CM | POA: Diagnosis not present

## 2022-09-03 ENCOUNTER — Ambulatory Visit (INDEPENDENT_AMBULATORY_CARE_PROVIDER_SITE_OTHER): Payer: Medicare Other | Admitting: Internal Medicine

## 2022-09-03 ENCOUNTER — Encounter: Payer: Self-pay | Admitting: Internal Medicine

## 2022-09-03 VITALS — BP 112/64 | HR 74 | Temp 98.3°F | Ht 67.0 in | Wt 272.0 lb

## 2022-09-03 DIAGNOSIS — J84112 Idiopathic pulmonary fibrosis: Secondary | ICD-10-CM | POA: Diagnosis not present

## 2022-09-03 DIAGNOSIS — Z23 Encounter for immunization: Secondary | ICD-10-CM | POA: Diagnosis not present

## 2022-09-03 DIAGNOSIS — Z5181 Encounter for therapeutic drug level monitoring: Secondary | ICD-10-CM | POA: Diagnosis not present

## 2022-09-03 LAB — BASIC METABOLIC PANEL
BUN: 17 mg/dL (ref 6–23)
CO2: 25 mEq/L (ref 19–32)
Calcium: 9.2 mg/dL (ref 8.4–10.5)
Chloride: 104 mEq/L (ref 96–112)
Creatinine, Ser: 1.11 mg/dL (ref 0.40–1.20)
GFR: 46.5 mL/min — ABNORMAL LOW (ref >60.00)
Glucose, Bld: 117 mg/dL — ABNORMAL HIGH (ref 70–99)
Potassium: 4.5 mEq/L (ref 3.5–5.1)
Sodium: 136 mEq/L (ref 135–145)

## 2022-09-03 LAB — CBC WITH DIFFERENTIAL/PLATELET
Basophils Absolute: 0.1 10*3/uL (ref 0.0–0.1)
Basophils Relative: 0.6 % (ref 0.0–3.0)
Eosinophils Absolute: 0.2 10*3/uL (ref 0.0–0.7)
Eosinophils Relative: 2.4 % (ref 0.0–5.0)
HCT: 42.5 % (ref 36.0–46.0)
Hemoglobin: 14.1 g/dL (ref 12.0–15.0)
Lymphocytes Relative: 29.8 % (ref 12.0–46.0)
Lymphs Abs: 2.8 10*3/uL (ref 0.7–4.0)
MCHC: 33.3 g/dL (ref 30.0–36.0)
MCV: 86.3 fl (ref 78.0–100.0)
Monocytes Absolute: 1.2 10*3/uL — ABNORMAL HIGH (ref 0.1–1.0)
Monocytes Relative: 12.4 % — ABNORMAL HIGH (ref 3.0–12.0)
Neutro Abs: 5.1 10*3/uL (ref 1.4–7.7)
Neutrophils Relative %: 54.8 % (ref 43.0–77.0)
Platelets: 238 10*3/uL (ref 150.0–400.0)
RBC: 4.93 Mil/uL (ref 3.87–5.11)
RDW: 13.8 % (ref 11.5–15.5)
WBC: 9.3 10*3/uL (ref 4.0–10.5)

## 2022-09-03 LAB — HEPATIC FUNCTION PANEL
ALT: 20 U/L (ref 0–35)
AST: 23 U/L (ref 0–37)
Albumin: 3.7 g/dL (ref 3.5–5.2)
Alkaline Phosphatase: 69 U/L (ref 39–117)
Bilirubin, Direct: 0.1 mg/dL (ref 0.0–0.3)
Total Bilirubin: 0.4 mg/dL (ref 0.2–1.2)
Total Protein: 7 g/dL (ref 6.0–8.3)

## 2022-09-03 NOTE — Addendum Note (Signed)
Addended by: Rosana Berger on: 09/03/2022 01:48 PM   Modules accepted: Orders

## 2022-09-03 NOTE — Progress Notes (Signed)
_0  ID: [name] [name], female    DOB: 05/01/1941, 81 y.o.   MRN: [id number]  Chief Complaint  Patient presents with   Follow-up    Pt. Says she's had some bleeding.     Referring provider: [name], MD  HPI: 81 year old female former smoker quit in 1971.  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 Patient of Dr. Chase Caller, last seen by pulmonary NP on 04/10/21.   Previous [facility] pulmonary encounter: 04/10/2021 follow-up; Bronchitis, Pulmonary fibrosis Patient returns for a follow-up visit, patient was seen last week for cough, congestion, shortness of breath and chills.  COVID-19 testing was negative.  Patient had been fully vaccinated for COVID-19.  She had no increased oxygen demands.  Patient was given doxycycline for 7 days and prednisone 20 mg for 5 days. Patient complains ongoing cough and congestion , mucus remains green on/off. No fever.  Eating well , no n/v.d. No hemoptysis , orthopnea or edema.  Called in over the weekend and was called in steroid burst. Currently on Prednisone 22m daily .  Chest xray showed chronic changes with no acute process.  [place]   04/17/2021 Patient presents today follow-up with PFTs. She did not have PFTs done today because she is still not feeling well. She completed course of doxycycline and has two days left of prednisone. Reports "cold symptoms" described as sinus drainage and cough. Sputum has not real purulence. Energy level is low. States that she feels her head is not right. She is using Flonase ans Claritin. She is short of breath. She completed doxycycline twice with no improvement. She is taking mucinex twice a day. She has not used albuterol rescue inhaler. Her ex husband died two weeks ago, she found out two days ago. Denies depressed feelings.    05/15/2021 -  Dr. RChase CallerType of  visit: Telephone/Video [name]89y.o. - changed face to face visit to telephone visit due to > 47F heat. Wantd to know PFT resuts June 2022 copared to sept 2021. FVC down 1.6%, DLCO down 2.1% (compared to y  2years ago FVC declined 7.7%). On Esbeit since Dec 2021/Jan 2022.  Says Dr C sarted on her ditliazem 04/18/21 and after that esbriet became more intolerable. She had constipationa and retain fluids.  So, she stopped all her medications except  eliquis and anti depressant and anti histamine. This means she stopped esbriet approx a week ago. Says that for several days still not feel better though better today. She is unsure if today is a good day v bad day. But overall feels A Fib is acting up. She wants to restarrtrt esbriet after good w wash out.   She is now using o2 at night but feels is not helping energy levels at day time. I Advised she could return it and then she said is actually helping her and does not want to return it.    08/14/21- Dr. RChase CallerChief Complaint  Patient presents with   Follow-up    Pt states she has been doing okay since last visit. Still becomes SOB with exertion.     [name]89y.o. -returns for follow-up.  Since her last visit because she was having calls with side effects from pirfenidone.  She stopped it.  She says after that she started feeling better.  Despite this she is  reporting progressive decline in dyspnea.  Dyspnea score is listed below.  She uses oxygen with exertion as needed.  I at night she uses CPAP without oxygen.  She is frustrated with pirfenidone.  She asked about the alternative nintedanib.  She recollects that there constraints with it in the setting of heart disease and anticoagulation.  Explained that one of the study showed 0.5% increase incidence and MI with nintedanib.  But not the other studies.  Explained that surgical mechanism of anticoagulation but clinically patient have tolerated.  Explained about GI side effects  particularly diarrhea.  She is nervous and hesitant but finally we took a shared decision making that she would start it at a lower dose given the fact it can be beneficial in reducing risk of progression.  We also discussed various aspects of clinical trials documented below.  She is interested in this as long as it is a medication with a much better side effect profile.  But at this point in time we took a shared decision making to go with standard of care therapy first.  She will have the flu shot today.  She is very nervous about getting mRNA COVID booster because of the side effect profile.  She had significant side effects apparently.  She also had omicron COVID in January 2022 and she is wondering about natural immunity.  However she is aware is COVID everywhere currently.  We took a shared decision making to check IgG antibody levels and adjust the risk threshold accordingly.  09/18/2021- Interim hx  Patient presents today for acute visit for medication reaction to OFEV. Patient called our office yesterday with reports of rectal bleeding and abdominal pain since starting 09/17/21. She is newly on OFEV since 10/13 and is also on Eliquis. Our office advised her to stop OFEV. Rectal bleeding has since resolved, she still has some slight lower abdomen discomfort described as "bloating".  Breathing wise she is stable. She uses POC 2L with exertion as needed.  She is on oxygen at night with her CPAP. Denies f/c/s, diarrhea, nausea or vomitting.     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  Small pulmonary nodules measuring 5 mm or less stable dating back to 2018 consistent with a benign etiology.  OV 09/26/2021  Subjective:  [name], female , DOB: 1941-08-27 , age 81 y.o. , MRN: [id number] , ADDRESS: [address] [place] [address] PCP [name], MD Patient Care Team: [name], MD as  PCP - General (Family Medicine) Sanda Klein, MD as PCP - Cardiology (Cardiology) Maisie Fus, MD as Consulting Physician (Obstetrics and Gynecology) Richmond Campbell, MD as Consulting Physician (Gastroenterology) Croitoru, Dani Gobble, MD as Consulting Physician (Cardiology) Kathie Rhodes, MD (Inactive) as Consulting Physician (Urology) Danella Sensing, MD as Consulting Physician (Dermatology) Brand Males, MD as Consulting Physician (Pulmonary Disease) Bo Merino, MD as Consulting Physician (Rheumatology) Barbaraann Cao, OD as Referring Physician (Optometry) Berenice Primas, MD as Referring Physician (Orthopedic Surgery)  This Provider for this visit: Treatment Team:  Attending Provider: Brand Males, MD    09/26/2021 -   Chief Complaint  Patient presents with   Follow-up    PFT performed today.  Pt states she has been doing okay since last visit.   Follow-up IPF   - sept 09, 2021 update - IPF dx givien: age greater than 4, only trace positive ANAdescription of small hiatal hernia on CT  scan, the type of pattern on the CT scan called probable UIP, autoimmune antibodies negative last year    - last CT July 2021   -last PFT sept 201  - portable o2 since sept 2021  -esbriet start end sept 2021 -> stopped 11/02/20 due to fatigue -> rstar 11/09/2020 -. Max dose 2 pilll tid since Dec 02, 2020 -> intermittent start/stop summer 2022 -> stopped aug 2022  - Overnight pulse oximetry done on 03/12/2021 shows time spent less than equal to 88% is 40.3 minutes.  Average pulse was 60/min.  Time spent in bradycardia 72.5%. Start 2 L nasal cannula at night  Multiple Lung Nodules 69m- stable 202-10-2017-> July 2021 Alpha-1 MZ but does not have emphysema on CT 202-11-21  [Daughter died from alpha-1 CC] Obesity  Diastolic dysfunction Atrial fibrillation on Eliquis Obstructive sleep apnea on CPAP without oxygen COVID-19 incidental 12/28/2020 - likely Omicron4  HPI BKEYERRA LAMERE89y.o.  -presents ith daughter ? SJeralyn Ruthsmeeting first time. Doing stable from symptom stand point resp wise but PFT down significantly. Took ofev for 5 days and there are aspectso of taking ofev (1023mbid - the lower dose) tht she liked. She felt her urinary stream had better flow (not a known side effect). She had lower appetite (known Se with ofev) but she liked the curb on appetie it due to her obesity. The 5d into she had had mild discomfort in suprapubic area (known SE for abd discmofort) and had 1 drop of painless rectal bleed. So stopped ofev (also on eliquisl bleeding theoertical risk with ofev). After stopping ofev, urinary flow is back to baseline(does not like it), and no mpre rectal bleeding . She never had rectal bleeding before and afer. She again stated and made clear the so called bleeding was "1 drop of blood ont he floor". Associted with ofev intake on background of eliquis and associted with suprapubic discomfor. No dysuria. Dr MaBrien Matess her GI. Last colonoscopy was when she ws 7562nd was normal apparently  Discussed  - seeing a GI doc for rectal bleed - she is agreeable   - discussed rechallenge with ofev v joining clinical trial phase 3( to be volunteer + exploit therapeutic advantage with 50% chance of placebo) Discussed other asoects of trials. Explained the 091995/02/11ephrysu study with IV MAB against CTGF Pamrevulumab v placeo.  See below. She and dauhger want her to do clinical trial but are more enthused about the idea of a ofev rechallenge first. We discussed that rechallenge liketly to cause side effects but anecdotally seems some patients who do not have issues. She is not interested in esbriet rechallenge       OV 10/10/2021  Subjective:  Patient ID: BrMarcellina Millinfemale , DOB: 11May 19, 1942 age 81.o. , MRN: [id number] [address] MiKathyrn LassMD Patient Care Team: MiKathyrn LassMD as PCP - General (Family  Medicine) CrSanda KleinMD as PCP - Cardiology (Cardiology) NeMaisie FusMD as Consulting Physician (Obstetrics and Gynecology) MeRichmond CampbellMD as Consulting Physician (Gastroenterology) Croitoru, MiDani GobbleMD as Consulting Physician (Cardiology) OtKathie RhodesMD (Inactive) as Consulting Physician (Urology) JoDanella SensingMD as Consulting Physician (Dermatology) RaBrand MalesMD as Consulting Physician (Pulmonary Disease) DeBo MerinoMD as Consulting Physician (Rheumatology) MiBarbaraann CaoOD as Referring Physician (Optometry) GrBerenice PrimasMD as Referring Physician (Orthopedic Surgery)  This Provider for this visit: Treatment Team:  Attending Provider:  Brand Males, MD    10/10/2021 -   Chief Complaint  Patient presents with   Follow-up    Patient states that  she is doing good with the Ofev and is currently not having any side effects. States that she has been coughing a lot lately. Wants to know if she is due for pneumonia vaccine.   Type of visit: Telephone/Video Circumstance: COVID-19 national emergency Identification of patient [name] [name] with November 19, 1941 and MRN [id number] - 2 person identifier Risks: Risks, benefits, limitations of telephone visit explained. Patient understood and verbalized agreement to proceed Anyone else on call: just patient Patient location: her home This provider location: [address], [place], [place] [address]   HPI PRANAVI AURE 81 y.o. -at this point telephone visit is to see how rechallenge with nintedanib is ongoing.  Since 10/06/2021 Saturday she is on 100 mg twice daily.  She says she has not had any recurrence of bleeding.  No GI symptoms.  Tolerating it well.  Dyspnea is not any worse.  No new issues.  Noticed mild reduction in GFR in October 2022.  Liver function test and hemoglobin are fine.             OV 11/09/2021  Subjective:  [name], female , DOB: October 26, 1941 , age 66 y.o.  , MRN: [id number] , ADDRESS: [address] [place] [address] PCP [name], MD Patient Care Team: [name], MD as PCP - General (Family Medicine) Croitoru, Dani Gobble, MD as PCP - Cardiology (Cardiology) Maisie Fus, MD as Consulting Physician (Obstetrics and Gynecology) Richmond Campbell, MD as Consulting Physician (Gastroenterology) Croitoru, Dani Gobble, MD as Consulting Physician (Cardiology) Kathie Rhodes, MD (Inactive) as Consulting Physician (Urology) Danella Sensing, MD as Consulting Physician (Dermatology) Brand Males, MD as Consulting Physician (Pulmonary Disease) Bo Merino, MD as Consulting Physician (Rheumatology) Barbaraann Cao, OD as Referring Physician (Optometry) Berenice Primas, MD as Referring Physician (Orthopedic Surgery)  This Provider for this visit: Treatment Team:  Attending Provider: Brand Males, MD    11/09/2021 -   Chief Complaint  Patient presents with   Follow-up    Pt states she is about the same since last visit. Still becomes SOB with exertion.    HPI [name] [name] 81 y.o. -returns for follow-up.  She restarted nintedanib on 10/06/2021 at the lower dose.  She tells me that she has not had any rectal bleeding except yesterday there was again a few drops.  No abdominal pain.  Last visit I told her to go see GI doctor but she has not.  Her GI doctor was Dr. Cristina Gong in [place] but has since retired.  Advised that that I will make a referral.  In terms of shortness of breath she is stable.  She believes she is tolerating the nintedanib fine.  She is actually gained some weight.  She also reports that for the last 2 weeks she is on metoprolol because of cardiac rhythm issues.  At today blood pressure was 666 systolic.Marland Kitchen  She tells me this is high for her.  She has never had this problem before according to history.  But the observations only today.  She acknowledges that this could be whitecoat hypertension as well.  Last  CT scan of the chest July 2021 Last pulmonary function test October 2022  She is not fully compliant with the portable oxygen.     No results found.     OV 05/31/2022  Subjective:  [name], female , DOB: 1941/07/14 , age 55 y.o. , MRN: [id number] , ADDRESS: [address] [place] [place] [address] PCP [name], MD Patient Care Team: [name], MD as PCP - General (Family Medicine) Sanda Klein, MD as PCP - Cardiology (Cardiology) Maisie Fus, MD as Consulting Physician (Obstetrics and Gynecology) Richmond Campbell, MD as Consulting Physician (Gastroenterology) Croitoru, Dani Gobble, MD as Consulting Physician (Cardiology) Kathie Rhodes, MD (Inactive) as Consulting Physician (Urology) Danella Sensing, MD as Consulting Physician (Dermatology) Brand Males, MD as Consulting Physician (Pulmonary Disease) Bo Merino, MD as Consulting Physician (Rheumatology) Barbaraann Cao, OD as Referring Physician (Optometry) Berenice Primas, MD as Referring Physician (Orthopedic Surgery)  This Provider for this visit: Treatment Team:  Attending Provider: Brand Males, MD   05/31/2022 -   Chief Complaint  Patient presents with   Follow-up    Pt states she is still having problems with her breathing. Also states that she feels like she is more constipated than before and feels like she might have some internal bleeding.     HPI NAKIYAH BEVERLEY 81 y.o. -on finger for several months.  She believes IPF is slowly getting worse.  Review of the symptom score shows that symptoms are definitely worse in the last 2 years but compared to 6 months ago around the same.  Shared high-resolution CT chest that shows progression of ILD over 2 years.  I shared the results with her.  This fits in with the symptoms also getting worse over the last 2 years.  She is worried about the progression.  In the past we recommended she consider clinical trial as a care  option but she declined.  This is still under consideration for her but she has not made up her mind.  Last echocardiogram was a few years ago.  Of note she has had rectal bleeding last year.  I referred her to Dr. Roena Malady and GI.  She had virtual colonoscopy.  She asked me to read the report.  It shows she has diverticulosis but does not seem anything hugely abnormal.  She is unsure if she still having rectal bleeding.  She thinks she might be.  She wants to switch GI doctors   She also tells me that since last visit she is on a new medicine for atrial fibrillation.  Tikosyn.  She wanted me to palpate a pulse.  I did this for 30 seconds and told her at least for the 30 seconds she is in sinus rhythm.     OV 09/03/2022  Subjective:  [name], female , DOB: [birth year] , age 86 y.o. , MRN: [id number] , ADDRESS: [address] [place] [address] PCP [name], MD Patient Care Team: [name], MD as PCP - General (Family Medicine) Sanda Klein, MD as PCP - Cardiology (Cardiology) Maisie Fus, MD (Inactive) as Consulting Physician (Obstetrics and Gynecology) Richmond Campbell, MD as Consulting Physician (Gastroenterology) Croitoru, Dani Gobble, MD as Consulting Physician (Cardiology) Kathie Rhodes, MD (Inactive) as Consulting Physician (Urology) Danella Sensing, MD as Consulting Physician (Dermatology) Brand Males, MD as Consulting Physician (Pulmonary Disease) Bo Merino, MD as Consulting Physician (Rheumatology) Barbaraann Cao, OD as Referring Physician (Optometry) Berenice Primas, MD as Referring Physician (Orthopedic Surgery)  This Provider for this visit: Treatment Team:  Attending Provider: Brand Males, MD   Follow-up IPF   - sept 09, 2021 update - IPF dx givien: age greater than 34, only  trace positive ANAdescription of small hiatal hernia on CT scan, the type of pattern on the CT scan called probable UIP, autoimmune  antibodies negative last year    - last CT July 2021   -last PFT October 2022  - portable o2 since sept 2021  -esbriet start end sept 2021 -> stopped 11/02/20 due to fatigue -> rstar 11/09/2020 -. Max dose 2 pilll tid since Dec 02, 2020 -> intermittent start/stop summer 2022 -> stopped aug 2022  - OFEV start Nov 2022  - low dose   Nocutruanl hyoxeia   - Overnight pulse oximetry done on 03/12/2021 shows time spent less than equal to 88% is 40.3 minutes.  Average pulse was 60/min.  Time spent in bradycardia 72.5%. Start 2 L nasal cannula at night  Multiple Lung Nodules 45m- stable 202-25-2018-> July 2021 Alpha-1 MZ but does not have emphysema on CT 22021-02-25  [Daughter died from alpha-1 CC] Obesity  Diastolic dysfunction Atrial fibrillation on Eliquis Obstructive sleep apnea on CPAP without oxygen COVID-19 incidental 12/28/2020 - likely Omicron4  09/03/2022 -   Chief Complaint  Patient presents with   Follow-up    Breathing is about the same. She has some dry cough in the am.      HPI [name] MARTHA89y.o. -returns for follow-up.  Today's visit has been compressed because instead of scheduling on 30 minutes she is on a 15-minute schedule.  For some reason the pulmonary function test is also not done.  In addition scheduling at the front desk was late.  Overall any event she is feeling stable she is tolerating 100 mg twice daily of nintedanib.  She is doing physical therapy and swimming and she is feeling stronger.  She does have some mild diarrhea on and off.  She has not had any more bright blood per rectum.  She is not sure if a GI appointment has gone through.  She says she is still waiting for that appointment.  Last set of labs was in August 2023.  She does have some amount of CKD  Review of the vaccination shows that she will have high-dose flu shot today she is agreed for this.  She is hesitant to get COVID mRNA vaccine.    SYMPTOM SCALE - ILD 07/21/2019   08/10/2020 262# 09/26/2020 Last  Weight  Most recent update: 09/26/2020  1:42 PM      Weight  120.9 kg (266 lb 9.6 oz)                     11/09/2020 274# 01/23/2021 279# 08/14/2021 275# 09/18/2021 Started OFEV 10/13 but stoped 10/17 d/t GIB  09/26/2021 Off ofev. No more "rectal bleed" 11/09/2021 Back on ofev 1062mbid since 10/06/21  280# 05/31/2022 276#  O2 use ra ra ra o2 with ex 02 with ex   O2 with exertoin     Shortness of Breath 0 -> 5 scale with 5 being worst (score 6 If unable to do)                At rest 0 0   5 0 4 0 0 0 0  Simple tasks - showers, clothes change, eating, shaving _0 3._1 Household (dishes, doing bed, laundry) _2 Shopping _3 4.5 4 4.5 4  Walking level at own pace 5 3  _0 4.5 3  Walking up Stairs _1 Total (40 - 48) Dyspnea Score _2 23.5 22._3 How bad is your cough? 1 1-_4 How bad is your fatigue _5 3._6 nausea   0 0 0 0 0 0 0 0 0.25  vomit   0 0 0 0 0 0 0 0 0  diarrhea   0 0 0 1 0 0.5 0 00 Once in a while  axiety   2 1 0 0 0.5 0.5 0 0 0  depression   2 1._7 0.5-_8 Simple office walk 185 feet x  3 laps goal with forehead probe 07/21/2019   08/10/2020   01/23/2021   09/18/2021  05/31/2022   O2 used _9   Number laps completed 3 laps _10 Only 1 of 3 laps  Comments about pace modertae with walker Slow pace Sow pace Slow pace slow  Resting Pulse Ox/HR 97% and 78/min 100% and 74/min 94%and HR 92 96% RA and HR 80 98% and HR 64  Final Pulse Ox/HR 93% and 109/min 77% and 85/min 90 % and HR 100 90% and HR 125 97% and HR 105  Desaturated </= 88% _11   Desaturated <= 3% points Yes, 4 Yes, 13 points Yes, 4 piints Yes, 6 points no  Got Tachycardic >/= 90/min you have pulmonary fibrosis otherwise call interstitial lung disease yes no yes Yes yes  Symptoms at end of test dyspnea Mild dyspnea Dyspnea throughtou Dyspnea  Mod dspnea  Miscellaneous  comments x Corrected with 2LNC and got    X  Very deconditioned    CT Chest data  No results found.    PFT     Latest Ref Rng & Units 09/26/2021    2:58 PM 05/07/2021    3:12 PM 08/10/2020   10:03 AM 07/19/2019    3:20 PM 02/24/2017   11:58 AM  PFT Results  FVC-Pre L 2.38  2.53  2.57  2.74  2.58   FVC-Predicted Pre % 79  84  84  93  85   FVC-Post L    2.63  2.45   FVC-Predicted Post %    89  81   Pre FEV1/FVC % % 87  86  86  86  82   Post FEV1/FCV % %    90  87   FEV1-Pre L 2.06  2.17  2.22  2.36  2.12   FEV1-Predicted Pre % 92  97  97  107  93   FEV1-Post L    2.35  2.14   DLCO uncorrected ml/min/mmHg 9.76  15.50  15.83  16.52  16.15   DLCO UNC% % 47  74  76  81  59   DLCO corrected ml/min/mmHg 9.48  15.50  15.34   14.93   DLCO COR %Predicted % 45  74  73   55   DLVA Predicted % 77  105  97  104  79   TLC L   4.50  3.97    TLC % Predicted %   81  74    RV % Predicted %   71  52  has a past medical history of Alpha-1-antitrypsin deficiency carrier, Arthritis, Atherosclerosis of aorta ([place]), Atrial fibrillation ([place]), Back pain, Complication of anesthesia, Diastolic dysfunction, Frequent UTI, GERD (gastroesophageal reflux disease), Hypertension, Hypertriglyceridemia, Insulin resistance, Long term current use of anticoagulant, Multiple lung nodules, Muscular deconditioning, MVP (mitral valve prolapse), Obesity, OSA on CPAP, Osteoarthritis, Osteopenia, Pacemaker, PAF (paroxysmal atrial fibrillation) ([place]), Positive ANA (antinuclear antibody), Presbyesophagus, Presence of permanent cardiac pacemaker, Primary osteoarthritis of both hands, Pulmonary fibrosis ([place]), PVC's (premature ventricular contractions), Shortness of breath, Sinus arrest (10/2014), Sleep apnea, Stroke ([place]) (01/2014), Tachy-brady syndrome ([place]) (10/2014), TIA (transient ischemic attack), and Vitamin D deficiency.   reports that she quit smoking about 52 years ago. Her smoking use included cigarettes. She has a  36.00 pack-year smoking history. She has never used smokeless tobacco.  Past Surgical History:  Procedure Laterality Date   CARDIOVERSION N/A 09/02/2017   Procedure: CARDIOVERSION;  Surgeon: Sanda Klein, MD;  Location: [facility];  Service: Cardiovascular;  Laterality: N/A;   COLONOSCOPY  2019   ESOPHAGEAL DILATION     EXCISION VAGINAL CYST     benign nodules   INSERT / REPLACE / REMOVE PACEMAKER  10/04/2014   Med[employer]onic Advisa model A2DR01 serial number [id number]   LOOP RECORDER EXPLANT N/A 10/04/2014   Procedure: LOOP RECORDER EXPLANT;  Surgeon: Sanda Klein, MD;  Location: [facility];  Service: Cardiovascular;  Laterality: N/A;   LOOP RECORDER IMPLANT N/A 04/19/2014   Procedure: LOOP RECORDER IMPLANT;  Surgeon: Sanda Klein, MD;  Location: [facility];  Service: Cardiovascular;  Laterality: N/A;   NM MYOCAR PERF WALL MOTION  12/18/2007   normal   PERMANENT PACEMAKER INSERTION N/A 10/04/2014   Procedure: PERMANENT PACEMAKER INSERTION;  Surgeon: Sanda Klein, MD;  Location: [facility];  Service: Cardiovascular;  Laterality: N/A;   TUBAL LIGATION     US ECHOCARDIOGRAPHY  05/15/2010   LA mildly dilated,mild mitral annular ca+, AOV mildly sclerotic, mild asymmetric LVH    Allergies  Allergen Reactions   Cefazolin Hives and Itching    Other reaction(s): severe hives   Pseudoephedrine Hcl     Other reaction(s): palpitations (moderate)   Ergotamine     unknown   Other Other (See Comments)   Pentobarbital     unknown   Pirfenidone     unknown   Caffeine Palpitations    Immunization History  Administered Date(s) Administered   Fluad Quad(high Dose 65+) 08/14/2021   Influenza Split 10/09/2009, 09/19/2010, 09/13/2020   Influenza Whole 09/23/2008   Influenza, High Dose Seasonal PF 09/13/2018, 09/27/2019, 09/26/2020   Influenza,inj,quad, With Preservative 09/04/2017   Influenza-Unspecified 09/17/2014, 09/09/2016, 09/04/2017   Moderna Sars-Covid-2 Vaccination 12/06/2019,  01/03/2020   Pneumococcal Conjugate-13 09/29/2014, 10/16/2015   Pneumococcal Polysaccharide-23 04/06/2009, 10/02/2017   Pneumococcal-Unspecified 10/01/2014, 10/02/2017   Td 04/05/1999   Tdap 04/06/2009   Zoster Recombinat (Shingrix) 09/29/2018, 01/19/2019   Zoster, Live 12/02/2008, 09/29/2018, 01/19/2019    Family History  Problem Relation Age of Onset   Heart attack Mother    Sudden death Mother    Stroke Father    Heart failure Father    Alcoholism Brother    Healthy Sister    Stroke Brother    Alpha-1 antitrypsin deficiency Daughter    Breast cancer Daughter    Prostate cancer Brother    Stroke Brother    Prostate cancer Brother    Healthy Brother    Prostate cancer Brother    Healthy Daughter    Diabetes Son    Arthritis  Son      Current Outpatient Medications:    apixaban (ELIQUIS) 5 MG TABS tablet, Take 1 tablet (5 mg total) by mouth 2 (two) times daily., Disp: 180 tablet, Rfl: 1   CALCIUM PO, Take 1 tablet by mouth daily., Disp: , Rfl:    Cholecalciferol (VITAMIN D3 PO), Take 1 tablet by mouth daily., Disp: , Rfl:    diazepam (VALIUM) 5 MG tablet, TAKE 1 TABLET EACH DAY., Disp: 30 tablet, Rfl: 0   dofetilide (TIKOSYN) 500 MCG capsule, Take 1 capsule (500 mcg total) by mouth 2 (two) times daily., Disp: 180 capsule, Rfl: 2   Magnesium 400 MG CAPS, Take 400 mg by mouth daily., Disp: 30 capsule, Rfl: 0   metoprolol tartrate (LOPRESSOR) 25 MG tablet, Take 1 tablet (25 mg total) by mouth 2 (two) times daily., Disp: 180 tablet, Rfl: 3   Nintedanib (OFEV) 100 MG CAPS, Take 1 capsule (100 mg total) by mouth 2 (two) times daily., Disp: 180 capsule, Rfl: 3   nitroGLYCERIN (NITROSTAT) 0.4 MG SL tablet, Place 1 tablet (0.4 mg total) under the tongue every 5 (five) minutes as needed for chest pain., Disp: 25 tablet, Rfl: 1   omeprazole (PRILOSEC) 40 MG capsule, Take 1 capsule by mouth daily., Disp: , Rfl:    sertraline (ZOLOFT) 100 MG tablet, TAKE 1 TABLET EACH DAY., Disp: 90  tablet, Rfl: 0      Objective:   Vitals:   09/03/22 1323  BP: 112/64  Pulse: 74  Temp: 98.3 F (36.8 C)  TempSrc: Oral  SpO2: 95%  Weight: 272 lb (123.4 kg)  Height: 5' 7" (1.702 m)    Estimated body mass index is 42.6 kg/m as calculated from the following:   Height as of this encounter: 5' 7" (1.702 m).   Weight as of this encounter: 272 lb (123.4 kg).  _0 @  Filed Weights   09/03/22 1323  Weight: 272 lb (123.4 kg)     Physical Exam General: No distress. Obese has cane Neuro: Alert and Oriented x 3. GCS 15. Speech normal Psych: Pleasant Resp:  Barrel Chest - no.  Wheeze - no, Crackles - yes, No overt respiratory distress CVS: Normal heart sounds. Murmurs - no Ext: Stigmata of Connective Tissue Disease - no HEENT: Normal upper airway. PEERL +. No post nasal drip        Assessment:       ICD-10-CM   1. IPF (idiopathic pulmonary fibrosis) ([place])  J84.112     2. Medication monitoring encounter  Z51.81          Plan:     Patient Instructions  IPF (idiopathic pulmonary fibrosis) ([place]) Medication monitoring encounter  - glad  overall you are toleratnig rechallenge of ofev 117m twice daily (lower dose regimen only for you) since 10/06/21 - symptoms same x 6 months but worse x 2 year; CT June 2023 worse over 2 years   Plan  -  - continue ofev 1078mtwice daily - check LFT, CBC, BMET. 09/03/2022 - check spirometry/dlco in 3 months -get echo - high dose flu shot 09/03/2022 - RSV vaccine on your own  - respect hesitatncty towarfds covid vaccine    Followup - 3 months do spirometry and dlco - return for 30 min visith wth DR RaChase Callern 2-3 months but after PFT  - walk test and symptoms questionnaire at followup    SIGNATURE    Dr. MuBrand MalesM.D., F.C.C.P,  Pulmonary and Critical Care Medicine Staff Physician, [facility]  [place] Director - Interstitial Lung Disease  Program  P[facility] [place] [facility] [place], [place], [address]  Pager: [phone], If no answer or between  15:00h - 7:00h: call [phone] Telephone: [phone]  1:42 PM 09/03/2022

## 2022-09-03 NOTE — Patient Instructions (Addendum)
IPF (idiopathic pulmonary fibrosis) ([place]) Medication monitoring encounter  - glad  overall you are toleratnig rechallenge of ofev 143m twice daily (lower dose regimen only for you) since 10/06/21 - symptoms same x 6 months but worse x 2 year; CT June 2023 worse over 2 years   Plan  -  - continue ofev 1061mtwice daily - check LFT, CBC, BMET. 09/03/2022 - check spirometry/dlco in 3 months -get echo - high dose flu shot 09/03/2022 - RSV vaccine on your own  - respect hesitatncty towarfds covid vaccine    Followup - 3 months do spirometry and dlco - return for 30 min visith wth DR RaChase Callern 2-3 months but after PFT  - walk test and symptoms questionnaire at followup

## 2022-09-04 DIAGNOSIS — M545 Low back pain, unspecified: Secondary | ICD-10-CM | POA: Diagnosis not present

## 2022-09-04 DIAGNOSIS — R2681 Unsteadiness on feet: Secondary | ICD-10-CM | POA: Diagnosis not present

## 2022-09-04 DIAGNOSIS — M6281 Muscle weakness (generalized): Secondary | ICD-10-CM | POA: Diagnosis not present

## 2022-09-04 DIAGNOSIS — M25511 Pain in right shoulder: Secondary | ICD-10-CM | POA: Diagnosis not present

## 2022-09-09 DIAGNOSIS — M6281 Muscle weakness (generalized): Secondary | ICD-10-CM | POA: Diagnosis not present

## 2022-09-09 DIAGNOSIS — M25511 Pain in right shoulder: Secondary | ICD-10-CM | POA: Diagnosis not present

## 2022-09-09 DIAGNOSIS — M545 Low back pain, unspecified: Secondary | ICD-10-CM | POA: Diagnosis not present

## 2022-09-09 DIAGNOSIS — R2681 Unsteadiness on feet: Secondary | ICD-10-CM | POA: Diagnosis not present

## 2022-09-11 DIAGNOSIS — M25511 Pain in right shoulder: Secondary | ICD-10-CM | POA: Diagnosis not present

## 2022-09-11 DIAGNOSIS — M6281 Muscle weakness (generalized): Secondary | ICD-10-CM | POA: Diagnosis not present

## 2022-09-11 DIAGNOSIS — M545 Low back pain, unspecified: Secondary | ICD-10-CM | POA: Diagnosis not present

## 2022-09-11 DIAGNOSIS — R2681 Unsteadiness on feet: Secondary | ICD-10-CM | POA: Diagnosis not present

## 2022-09-16 DIAGNOSIS — R2681 Unsteadiness on feet: Secondary | ICD-10-CM | POA: Diagnosis not present

## 2022-09-16 DIAGNOSIS — M25511 Pain in right shoulder: Secondary | ICD-10-CM | POA: Diagnosis not present

## 2022-09-16 DIAGNOSIS — M6281 Muscle weakness (generalized): Secondary | ICD-10-CM | POA: Diagnosis not present

## 2022-09-16 DIAGNOSIS — M545 Low back pain, unspecified: Secondary | ICD-10-CM | POA: Diagnosis not present

## 2022-09-17 DIAGNOSIS — R7303 Prediabetes: Secondary | ICD-10-CM | POA: Diagnosis not present

## 2022-09-17 DIAGNOSIS — F331 Major depressive disorder, recurrent, moderate: Secondary | ICD-10-CM | POA: Diagnosis not present

## 2022-09-17 DIAGNOSIS — Z79899 Other long term (current) drug therapy: Secondary | ICD-10-CM | POA: Diagnosis not present

## 2022-09-17 DIAGNOSIS — I700 Atherosclerosis of aorta: Secondary | ICD-10-CM | POA: Diagnosis not present

## 2022-09-17 DIAGNOSIS — J8410 Pulmonary fibrosis, unspecified: Secondary | ICD-10-CM | POA: Diagnosis not present

## 2022-09-20 ENCOUNTER — Telehealth: Payer: Self-pay | Admitting: Internal Medicine

## 2022-09-20 DIAGNOSIS — R0609 Other forms of dyspnea: Secondary | ICD-10-CM

## 2022-09-20 DIAGNOSIS — Z8679 Personal history of other diseases of the circulatory system: Secondary | ICD-10-CM

## 2022-09-23 NOTE — Telephone Encounter (Signed)
Called patient this morning and she is wondering about when she is needing to get her echocardiogram.   She states that she is also having digestion issues and is wondering if it can be due to the Ofev or not.  Please advise Sir.

## 2022-09-24 NOTE — Telephone Encounter (Signed)
Yes I ordered an echo at last visit.  Please make sure 1 is scheduled.  GI discomfort issues can certainly be because of nintedanib - > she should take a holiday for 1 week and then start at 1 pill once daily for 1 week and then go to 1 pill twice daily to maintain.  -If the discomfort goes away then definitely due to nintedanib  -If the discomfort comes back with rechallenge then definitely due to nintedanib  -She will have to tell us how she feels as we go along with this plan

## 2022-09-25 ENCOUNTER — Telehealth: Payer: Self-pay | Admitting: Internal Medicine

## 2022-09-25 NOTE — Telephone Encounter (Signed)
Called and spoke with patient. She verbalized understanding. She stated that she has found that sweet pickles have helped tremendously with the nausea she was having.   I also advised her that one of the PCCs will call her in regards to the echo. I checked her chart and did not see an order for one but told her I would place the order. She verbalized understanding.   Order for echo has been placed. Nothing further needed at time of call.

## 2022-09-25 NOTE — Telephone Encounter (Signed)
D/w [name] [name]   Ofev at low dose been causing diarrhea, constipation, stomach growling -> so holiday. She will take holiday with ofev x 1 wek and rechallenge going to 1 pill once a day for a week and then regular dose of 1 pill 2 times daily [100 mg pills]  She is interested in glutathione nebulizer. DEvki -do you know anything about this.  She states her daughter who had alpha-1 used this and [facility]  We discussed [place] research protocol and at this point in time she wants to pursue the above 2 options.

## 2022-10-04 ENCOUNTER — Telehealth: Payer: Self-pay | Admitting: Family Medicine

## 2022-10-04 NOTE — Telephone Encounter (Signed)
Patient called stating she would like to become a patient of Dr.Burchette. I let patient know that he was not accepting any new patients right now, but patient stated that she used to be a real estate agent and she sold Dr.Burchette his house in [place]. Patient hoped this would change his mind.       Please advise

## 2022-10-09 ENCOUNTER — Encounter: Payer: Self-pay | Admitting: *Deleted

## 2022-10-09 ENCOUNTER — Ambulatory Visit (INDEPENDENT_AMBULATORY_CARE_PROVIDER_SITE_OTHER): Payer: Medicare Other

## 2022-10-09 DIAGNOSIS — I495 Sick sinus syndrome: Secondary | ICD-10-CM | POA: Diagnosis not present

## 2022-10-10 LAB — CUP PACEART REMOTE DEVICE CHECK
Battery Remaining Longevity: 30 mo
Battery Voltage: 2.94 V
Brady Statistic AP VP Percent: 0.06 %
Brady Statistic AP VS Percent: 97.16 %
Brady Statistic AS VP Percent: 0 %
Brady Statistic AS VS Percent: 2.78 %
Brady Statistic RA Percent Paced: 97.15 %
Brady Statistic RV Percent Paced: 0.06 %
Date Time Interrogation Session: 20231108161231
Implantable Lead Connection Status: 753985
Implantable Lead Connection Status: 753985
Implantable Lead Implant Date: 20151103
Implantable Lead Implant Date: 20151103
Implantable Lead Location: 753859
Implantable Lead Location: 753860
Implantable Lead Model: 5076
Implantable Lead Model: 5076
Implantable Pulse Generator Implant Date: 20151103
Lead Channel Impedance Value: 418 Ohm
Lead Channel Impedance Value: 456 Ohm
Lead Channel Impedance Value: 494 Ohm
Lead Channel Impedance Value: 513 Ohm
Lead Channel Pacing Threshold Amplitude: 0.5 V
Lead Channel Pacing Threshold Amplitude: 0.625 V
Lead Channel Pacing Threshold Pulse Width: 0.4 ms
Lead Channel Pacing Threshold Pulse Width: 0.4 ms
Lead Channel Sensing Intrinsic Amplitude: 5 mV
Lead Channel Sensing Intrinsic Amplitude: 5 mV
Lead Channel Sensing Intrinsic Amplitude: 6.375 mV
Lead Channel Sensing Intrinsic Amplitude: 6.375 mV
Lead Channel Setting Pacing Amplitude: 1.5 V
Lead Channel Setting Pacing Amplitude: 2 V
Lead Channel Setting Pacing Pulse Width: 0.4 ms
Lead Channel Setting Sensing Sensitivity: 2.8 mV
Zone Setting Status: 755011
Zone Setting Status: 755011

## 2022-10-11 ENCOUNTER — Telehealth: Payer: Self-pay

## 2022-10-11 NOTE — Telephone Encounter (Signed)
   Pre-operative Risk Assessment    Patient Name: [name] [name]  DOB: May 12, 1941 MRN: [id number]      Request for Surgical Clearance    Procedure:   Cataract extraction with Intraocular Lens Implantation of the Right and Left Eye   Date of Surgery:  Clearance 11/29/22                                 Surgeon:  N/A Surgeon's Group or Practice Name:  [facility] [place] Phone number:  [phone]  Fax number:  [phone]   Type of Clearance Requested:   Medical - Pharmacy:  Hold Apixaban (Eliquis)     Type of Anesthesia:  Local    Additional requests/questions:     Erin Hearing   10/11/2022, 4:44 PM

## 2022-10-14 NOTE — Telephone Encounter (Signed)
   Patient Name: [name] [name]  DOB: 1941-09-17 MRN: [id number]  Primary Cardiologist: Sanda Klein, MD  Chart reviewed as part of pre-operative protocol coverage. Cataract extractions are recognized in guidelines as low risk surgeries that do not typically require specific preoperative testing or holding of blood thinner therapy. Therefore, given past medical history and time since last visit, based on ACC/AHA guidelines, [name] would be at acceptable risk for the planned procedure without further cardiovascular testing.   I will route this recommendation to the requesting party via Epic fax function and remove from pre-op pool.  Please call with questions.  Elgie Collard, PA-C 10/14/2022, 10:50 AM

## 2022-10-15 ENCOUNTER — Other Ambulatory Visit: Payer: Self-pay | Admitting: Cardiovascular Disease

## 2022-10-15 NOTE — Telephone Encounter (Signed)
Email sent to [place] regarding nebulized glutathione. Unable to find any compelling and reliable sources of nebulizer glutathione that is commercially available or able to be prepped by compounding pharmacy. Nebulized glutathione that is referenced in clinical studies is prepared specifically for the study setting.  Wil continue to f/u  Knox Saliva, PharmD, MPH, BCPS, CPP Clinical Pharmacist (Rheumatology and Pulmonology)

## 2022-10-22 ENCOUNTER — Telehealth: Payer: Self-pay | Admitting: *Deleted

## 2022-10-22 NOTE — Telephone Encounter (Signed)
ATC patient, call disconnected after 3 rings x2. ATC daughter Manuela Schwartz, LVM indicating that Ms. Miske left a message with [place] about a nebulized medication and we were trying to get ahold of her. Also wanted to clarify if Manuela Schwartz is the daughter indicated in original message that had alpha-1 and used nebulized glutathione.  Will f/u.  [place] [facility] PharmD Candidate [phone]

## 2022-10-22 NOTE — Telephone Encounter (Signed)
Called and spoke to patient about the recommendations form Dr Chase Caller. She verbalized understanding. Nothing further needed

## 2022-10-22 NOTE — Telephone Encounter (Signed)
Called patient back and she states she has a few questions for Dr Chase Caller.  1st question: She is wondering if she is safe to take the RSV vaccine due to the medication Tikosyn she is on.  2nd question: Is she ok to take the PNA vaccine as well  3rd update: She states she stopped the medication Ofev a few weeks ago due to her not liking the side effects. She was having fatigue, diarrhea, stomach hurting, etc. She states she is feeling a lot better now  4th update: she is not wanting to do the research program at this time.  5th question: she is wondering if Dr Chase Caller wants to start the medication (glutathione) she thinks this is the medication.   Please advise sir

## 2022-10-22 NOTE — Telephone Encounter (Signed)
She can have the RSV vaccine She can have the pneumonia vaccine Okay will monitor her off nintedanib/Ofev Respect not wanting to do clinical trial at this time Glutathione: I think is available at [facility] nutritional store.  She should call them first and it could just be an over-the-counter thing

## 2022-10-22 NOTE — Progress Notes (Signed)
Remote pacemaker transmission.   

## 2022-10-23 NOTE — Telephone Encounter (Signed)
Spoke with patient and informed her about conversation with [facility] (do not compound nebulized glutathione anymore due to being unable to get necessary ingredients of sufficient quality). Patient stated that when she spoke with the owner of [facility] last week they did not indicate that they were unable to provide this medication. Patient was advised to follow-up about this concern with [facility].   Patient inquired about Dr. Golden Pop thoughts on the use of nebulized glutathione, informed her that Dr. Chase Caller would most likely discuss this with her (including some study information) at her next visit in December.  [place] [facility] Pharm[facility] PharmD Candidate [phone]

## 2022-10-28 ENCOUNTER — Ambulatory Visit (INDEPENDENT_AMBULATORY_CARE_PROVIDER_SITE_OTHER): Payer: Medicare Other | Admitting: Family Medicine

## 2022-10-28 ENCOUNTER — Encounter: Payer: Self-pay | Admitting: Family Medicine

## 2022-10-28 VITALS — BP 128/70 | HR 88 | Temp 97.5°F | Ht 67.0 in | Wt 266.7 lb

## 2022-10-28 DIAGNOSIS — Z6841 Body Mass Index (BMI) 40.0 and over, adult: Secondary | ICD-10-CM | POA: Diagnosis not present

## 2022-10-28 DIAGNOSIS — I495 Sick sinus syndrome: Secondary | ICD-10-CM

## 2022-10-28 DIAGNOSIS — I480 Paroxysmal atrial fibrillation: Secondary | ICD-10-CM | POA: Diagnosis not present

## 2022-10-28 DIAGNOSIS — I519 Heart disease, unspecified: Secondary | ICD-10-CM | POA: Diagnosis not present

## 2022-10-28 DIAGNOSIS — G4733 Obstructive sleep apnea (adult) (pediatric): Secondary | ICD-10-CM

## 2022-10-28 DIAGNOSIS — K219 Gastro-esophageal reflux disease without esophagitis: Secondary | ICD-10-CM | POA: Diagnosis not present

## 2022-10-28 NOTE — Progress Notes (Signed)
New Patient Office Visit  Subjective    [name], female    DOB: 1941-02-11  Age: 81 y.o. MRN: [id number]  CC: No chief complaint on file.   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  She is followed closely by pulmonary and cardiology.  She has some chronic dyspnea with no recent acute changes.  No recent chest pains.  She has echocardiogram which has been ordered and pending.  Her history is that she currently resides at friend's home.  She has been there for few years.  She had a daughter that died of complications of alpha-1 antitrypsin deficiency about 5 years ago.  This been very stressful for her and she has battled depression intermittently since then.  She remains on sertraline 100 mg daily but still has periods of feeling down at times.  She has another daughter and son who are well.  She has had some dysphagia issues in the past and states she has had her esophagus dilated in the past.  Currently on Prilosec 40 mg daily.  Had some recent globus symptoms but not consistently.  Current medications include Eliquis 5 mg twice daily, Tikosyn 500 mg twice daily, metoprolol tartrate 25 mg twice daily, Prilosec 40 mg once daily, and sertraline 100 mg daily.  She has gained quite a bit of weight she states over the past several years.  She thinks she does a lot of stress eating.  Is not able to get a lot of exercise because of her chronic lung and heart situation.  Outpatient Encounter Medications as of 10/28/2022  Medication Sig   apixaban (ELIQUIS) 5 MG TABS tablet Take 1 tablet (5 mg total) by mouth 2 (two) times daily.   CALCIUM PO Take 1 tablet by mouth daily.   Cholecalciferol (VITAMIN D3 PO) Take 1 tablet by  mouth daily.   diazepam (VALIUM) 5 MG tablet TAKE 1 TABLET EACH DAY.   dofetilide (TIKOSYN) 500 MCG capsule Take 1 capsule (500 mcg total) by mouth 2 (two) times daily.   Magnesium 400 MG CAPS Take 400 mg by mouth daily.   metoprolol tartrate (LOPRESSOR) 25 MG tablet TAKE ONE TABLET BY MOUTH TWICE DAILY   Nintedanib (OFEV) 100 MG CAPS Take 1 capsule (100 mg total) by mouth 2 (two) times daily.   nitroGLYCERIN (NITROSTAT) 0.4 MG SL tablet Place 1 tablet (0.4 mg total) under the tongue every 5 (five) minutes as needed for chest pain.   omeprazole (PRILOSEC) 40 MG capsule Take 1 capsule by mouth daily.   sertraline (ZOLOFT) 100 MG tablet TAKE 1 TABLET EACH DAY.   No facility-administered encounter medications on file as of 10/28/2022.    Past Medical History:  Diagnosis Date   Alpha-1-antitrypsin deficiency carrier    Arthritis    "all over"   Atherosclerosis of aorta [facility])    Atrial fibrillation ([place])    Back pain    Complication of anesthesia    "I woke up during 2 different procedures" (29/08/3715)   Diastolic dysfunction    Frequent UTI    GERD (gastroesophageal reflux disease)    Hypertension    Hypertriglyceridemia    Insulin resistance    Long term current use of anticoagulant    Multiple lung nodules    Muscular deconditioning  MVP (mitral valve prolapse)    Obesity    OSA on CPAP    Osteoarthritis    Osteopenia    unspecified location   Pacemaker    PAF (paroxysmal atrial fibrillation) (HCC)    Positive ANA (antinuclear antibody)    Presbyesophagus    Presence of permanent cardiac pacemaker    Primary osteoarthritis of both hands    neck and spine and hips   Pulmonary fibrosis ([place])    followed by pulmonolgy   PVC's (premature ventricular contractions)    Shortness of breath    Sinus arrest 10/2014   s/p Medtronic Advisa model A2DR01 serial number [id number]   Sleep apnea    Stroke ([place]) 01/2014   denies deficits on 10/04/2014   Tachy-brady syndrome ([place])  10/2014   TIA (transient ischemic attack)    Vitamin D deficiency     Past Surgical History:  Procedure Laterality Date   CARDIOVERSION N/A 09/02/2017   Procedure: CARDIOVERSION;  Surgeon: Sanda Klein, MD;  Location: [facility];  Service: Cardiovascular;  Laterality: N/A;   COLONOSCOPY  2019   ESOPHAGEAL DILATION     EXCISION VAGINAL CYST     benign nodules   INSERT / REPLACE / REMOVE PACEMAKER  10/04/2014   Medtronic Advisa model A2DR01 serial number [id number] H   LOOP RECORDER EXPLANT N/A 10/04/2014   Procedure: LOOP RECORDER EXPLANT;  Surgeon: Sanda Klein, MD;  Location: [facility] CATH LAB;  Service: Cardiovascular;  Laterality: N/A;   LOOP RECORDER IMPLANT N/A 04/19/2014   Procedure: LOOP RECORDER IMPLANT;  Surgeon: Sanda Klein, MD;  Location: [facility] CATH LAB;  Service: Cardiovascular;  Laterality: N/A;   NM MYOCAR PERF WALL MOTION  12/18/2007   normal   PERMANENT PACEMAKER INSERTION N/A 10/04/2014   Procedure: PERMANENT PACEMAKER INSERTION;  Surgeon: Sanda Klein, MD;  Location: [facility] CATH LAB;  Service: Cardiovascular;  Laterality: N/A;   TUBAL LIGATION     US ECHOCARDIOGRAPHY  05/15/2010   LA mildly dilated,mild mitral annular ca+, AOV mildly sclerotic, mild asymmetric LVH    Family History  Problem Relation Age of Onset   Heart attack Mother    Sudden death Mother    Stroke Father    Heart failure Father    Alcoholism Brother    Healthy Sister    Stroke Brother    Alpha-1 antitrypsin deficiency Daughter    Breast cancer Daughter    Prostate cancer Brother    Stroke Brother    Prostate cancer Brother    Healthy Brother    Prostate cancer Brother    Healthy Daughter    Diabetes Son    Arthritis Son     Social History   Socioeconomic History   Marital status: Divorced    Spouse name: Not on file   Number of children: 3   Years of education: college   Highest education level: Not on file  Occupational History   Occupation: Retired Engineer, site  Tobacco Use    Smoking status: Former    Packs/day: 2.00    Years: 18.00    Total pack years: 36.00    Types: Cigarettes    Quit date: 07/18/1970    Years since quitting: 52.3   Smokeless tobacco: Never   Tobacco comments:    Former smoker 07/18/22 quit smoking in 1971  Vaping Use   Vaping Use: Never used  Substance and Sexual Activity   Alcohol use: Yes    Alcohol/week: 0.0 standard drinks of alcohol    Comment:  10/04/2014 "might have a drink a couple times/yr"   Drug use: No   Sexual activity: Never  Other Topics Concern   Not on file  Social History Narrative   Lives at the [facility]    Patient is right handed.   Patient has a college degree.   Patient is retired.      Social History      Diet? Awful sweets are a terrible addiction       Do you drink/eat things with caffeine? some      Marital status?          divorced                          What year were you married? 1960      Do you live in a house, apartment, assisted living, condo, trailer, etc.? Friends [place]       Is it one or more stories? 1st level      How many persons live in your home? 1      Do you have any pets in your home? (please list) no       Highest level of education completed? College graduate      Current or past profession: realtor       Do you exercise?             Not much                          Type & how often? Barely any  - bed exercise for injured knee      Advanced Directives      Do you have a living will? yes      Do you have a DNR form?                                  If not, do you want to discuss one?yes      Do you have signed POA/HPOA for forms? yes      Functional Status      Do you have difficulty bathing or dressing yourself? No       Do you have difficulty preparing food or eating? No       Do you have difficulty managing your medications? No       Do you have difficulty managing your finances? No       Do you have difficulty affording your medications? No  Except  eloquist after donut hole - so far I have been able to get samples from cardiologist      Social Determinants of Health   Financial Resource Strain: Not on file  Food Insecurity: Not on file  Transportation Needs: Not on file  Physical Activity: Not on file  Stress: Not on file  Social Connections: Not on file  Intimate Partner Violence: Not on file    Review of Systems  Constitutional:  Negative for chills and fever.  Respiratory:  Positive for shortness of breath.   Cardiovascular:  Negative for chest pain.  Gastrointestinal:  Negative for abdominal pain.  Neurological:  Negative for dizziness.        Objective    BP 128/70 (BP Location: Left Arm, Patient Position: Sitting, Cuff Size: Large)   Pulse 88   Temp (!) 97.5 F (36.4 C) (Oral)   Ht 5'  7" (1.702 m)   Wt 266 lb 11.2 oz (121 kg)   LMP  (LMP Unknown)   SpO2 96%   BMI 41.77 kg/m   Physical Exam Vitals reviewed.  Cardiovascular:     Rate and Rhythm: Normal rate and regular rhythm.  Pulmonary:     Effort: Pulmonary effort is normal. No respiratory distress.     Breath sounds: No wheezing.  Musculoskeletal:     Right lower leg: No edema.     Left lower leg: No edema.  Neurological:     Mental Status: She is alert.  Psychiatric:        Mood and Affect: Mood normal.        Thought Content: Thought content normal.         Assessment & Plan:   #1 history of paroxysmal atrial fibrillation.  Currently appears to be in sinus rhythm.  Patient on Eliquis and metoprolol tartrate and stable.  She also has history of tachybradycardia syndrome.  #2 history of GERD.  No active GERD symptoms currently on Prilosec 40 mg daily.  Does have intermittent symptoms of globus.  Follow-up for any daily or progressive dysphagia symptoms  #3 history of recurrent depression.  Currently on sertraline 100 mg daily.  Overall stable.  #4 obstructive sleep apnea.  She is on CPAP.  Followed through pulmonary.  #5 pulmonary  fibrosis followed by pulmonary.  She feels her current symptoms are stable overall.  Flu vaccine already given.  Pneumonia vaccines complete.  He has not had RSV and we did suggest that she consider that.  We spent 45 minutes total including face-to-face time and time reviewing her past history, medications, previous testing  Return in about 6 months (around 04/28/2023).   Carolann Littler, MD

## 2022-10-29 ENCOUNTER — Encounter: Payer: Self-pay | Admitting: Family Medicine

## 2022-10-29 ENCOUNTER — Encounter: Payer: Self-pay | Admitting: Internal Medicine

## 2022-10-29 DIAGNOSIS — I519 Heart disease, unspecified: Secondary | ICD-10-CM | POA: Insufficient documentation

## 2022-10-29 NOTE — Telephone Encounter (Signed)
[facility] and speak to add and find out the glutathione prescription dose and I will do a 1 month prescription on this.

## 2022-10-29 NOTE — Telephone Encounter (Signed)
MR, please see pt's message regarding Glutathione. Thanks.

## 2022-11-01 NOTE — Telephone Encounter (Signed)
Recent mychart message sent by pt: [name] [name] "OBrenda"  P [facility] (supporting Brand Males, MD)19 hours ago (1:09 PM)    [facility] Jeralyn Bennett. [phone].  He is available M- F.  11- 4.  Another pharmacist Gwinda Passe can help also. Ed said the dosage should be. 46m twice daily.  Nebulized. Thanks BHassan Rowan     MR, please advise.

## 2022-11-01 NOTE — Telephone Encounter (Signed)
Ok to do glutathione 1m per day. She fully understands my limited experience with it and takes the risk on it and I have agred for a time limited trial on it

## 2022-11-05 NOTE — Telephone Encounter (Signed)
[facility] and spoke with [place]. Gwinda Passe states the Glutathione no longer comes in the route of nebulization. Gwinda Passe states there are other routes - oral and transdermal cream but is unsure how this would be a comparable replacement. MR, please advise. Thanks.

## 2022-11-05 NOTE — Telephone Encounter (Signed)
I have never prescribed oral glutathione.  I do not know what side effects.  I have no idea if it will help her.  Definitely the cream will not work and fibrosis.  Even the oral pill if it works I do not know what the safety risk is.  If she wants she can start at 50 mg oral per day.  I can try to monitor liver function test and other blood test once a month.  But I cannot take any responsibility if something bad happens to her.

## 2022-11-14 ENCOUNTER — Other Ambulatory Visit (HOSPITAL_COMMUNITY): Payer: Medicare Other

## 2022-11-15 ENCOUNTER — Ambulatory Visit (INDEPENDENT_AMBULATORY_CARE_PROVIDER_SITE_OTHER): Payer: Medicare Other | Admitting: Emergency Medicine

## 2022-11-15 ENCOUNTER — Encounter: Payer: Self-pay | Admitting: Emergency Medicine

## 2022-11-15 VITALS — BP 122/64 | HR 75 | Temp 97.6°F | Wt 267.0 lb

## 2022-11-15 DIAGNOSIS — J069 Acute upper respiratory infection, unspecified: Secondary | ICD-10-CM

## 2022-11-15 MED ORDER — BENZONATATE 100 MG PO CAPS: 100.0000 mg | ORAL_CAPSULE | Freq: Four times a day (QID) | ORAL | 1 refills | Status: DC | PRN

## 2022-11-15 MED ORDER — IPRATROPIUM BROMIDE 0.06 % NA SOLN: 2.0000 | Freq: Four times a day (QID) | NASAL | 12 refills | Status: DC | PRN

## 2022-11-15 NOTE — Progress Notes (Signed)
Subjective:    [name], female    DOB: 05-16-1941, 81 y.o.   MRN: [id number]  HPI  Acute office visit 11/15/2022  --  81 year old former smoker (36 pack years) followed by Dr. Chase Caller in our office for interstitial lung disease last seen in October.  She has been on nintedanib in the past, held in October due to GI upset.  She is also consider getting on glutathione.  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 Currently managed on omeprazole 40 mg daily. Today she reports doing well until she developed congestion, upper respiratory symptoms about 4 to 5 days ago, sore throat. No fever. Led to cough - happening mainly in the morning. Clearing clear/white mucous. Scratchy throat and hoarse voice. A lot of fatigue and poor sleep. She started mucinex DM 5 days ago.   Review of Systems As per Hpi  Past Medical History:  Diagnosis Date   Alpha-1-antitrypsin deficiency carrier    Arthritis    "all over"   Atherosclerosis of aorta (HCC)    Atrial fibrillation ([place])    Back pain    Complication of anesthesia    "I woke up during 2 different procedures" (10/04/2014)   Depression    Diastolic dysfunction    Eating disorder    Frequent UTI    GERD (gastroesophageal reflux disease)    Heart disease    Hypertension    Hypertriglyceridemia    Insulin resistance    Long term current use of anticoagulant    Multiple lung nodules    Muscular deconditioning    MVP (mitral valve prolapse)    Obesity    OSA on CPAP    Osteoarthritis    Osteopenia    unspecified location   Pacemaker    PAF (paroxysmal atrial fibrillation) (HCC)    Positive ANA (antinuclear antibody)    Presbyesophagus    Presence of permanent cardiac pacemaker    Primary osteoarthritis of both hands    neck and  spine and hips   Pulmonary fibrosis ([place])    followed by pulmonolgy   PVC's (premature ventricular contractions)    Shortness of breath    Sinus arrest 10/2014   s/p Medtronic Advisa model A2DR01 serial number [id number]   Sleep apnea    Stroke ([place]) 01/2014   denies deficits on 10/04/2014   Tachy-brady syndrome ([place]) 10/2014   TIA (transient ischemic attack)    Vitamin D deficiency      Family History  Problem Relation Age of Onset   Heart attack Mother    Sudden death Mother    Stroke Father    Heart failure Father    Alcoholism Brother    Healthy Sister    Stroke Brother    Alpha-1 antitrypsin deficiency Daughter    Breast cancer Daughter    Prostate cancer Brother    Stroke Brother    Prostate cancer Brother    Healthy Brother    Prostate cancer Brother    Healthy Daughter    Diabetes Son    Arthritis Son      Social History   Socioeconomic History   Marital status: Divorced    Spouse name: Not on file   Number of children: 3   Years of education: college   Highest education level: Not on file  Occupational History   Occupation:  Retired Engineer, site  Tobacco Use   Smoking status: Former    Packs/day: 2.00    Years: 18.00    Total pack years: 36.00    Types: Cigarettes    Quit date: 07/18/1970    Years since quitting: 52.3   Smokeless tobacco: Never   Tobacco comments:    Former smoker 07/18/22 quit smoking in 1971  Vaping Use   Vaping Use: Never used  Substance and Sexual Activity   Alcohol use: Yes    Alcohol/week: 0.0 standard drinks of alcohol    Comment: 10/04/2014 "might have a drink a couple times/yr"   Drug use: No   Sexual activity: Never  Other Topics Concern   Not on file  Social History Narrative   Lives at the [facility]    Patient is right handed.   Patient has a college degree.   Patient is retired.      Social History      Diet? Awful sweets are a terrible addiction       Do you drink/eat things with caffeine? some       Marital status?          divorced                          What year were you married? 1960      Do you live in a house, apartment, assisted living, condo, trailer, etc.? Friends [place]       Is it one or more stories? 1st level      How many persons live in your home? 1      Do you have any pets in your home? (please list) no       Highest level of education completed? College graduate      Current or past profession: realtor       Do you exercise?             Not much                          Type & how often? Barely any  - bed exercise for injured knee      Advanced Directives      Do you have a living will? yes      Do you have a DNR form?                                  If not, do you want to discuss one?yes      Do you have signed POA/HPOA for forms? yes      Functional Status      Do you have difficulty bathing or dressing yourself? No       Do you have difficulty preparing food or eating? No       Do you have difficulty managing your medications? No       Do you have difficulty managing your finances? No       Do you have difficulty affording your medications? No  Except eloquist after donut hole - so far I have been able to get samples from cardiologist      Social Determinants of Health   Financial Resource Strain: Not on file  Food Insecurity: Not on file  Transportation Needs: Not on file  Physical Activity: Not on file  Stress: Not  on file  Social Connections: Not on file  Intimate Partner Violence: Not on file     Allergies  Allergen Reactions   Cefazolin Hives and Itching    Other reaction(s): severe hives   Pseudoephedrine Hcl     Other reaction(s): palpitations (moderate)   Ergotamine     unknown   Other Other (See Comments)   Pentobarbital     unknown   Pirfenidone     unknown   Caffeine Palpitations     Outpatient Medications Prior to Visit  Medication Sig Dispense Refill   apixaban (ELIQUIS) 5 MG TABS tablet Take 1 tablet (5  mg total) by mouth 2 (two) times daily. 180 tablet 1   CALCIUM PO Take 1 tablet by mouth daily.     Cholecalciferol (VITAMIN D3 PO) Take 1 tablet by mouth daily.     diazepam (VALIUM) 5 MG tablet TAKE 1 TABLET EACH DAY. 30 tablet 0   dofetilide (TIKOSYN) 500 MCG capsule Take 1 capsule (500 mcg total) by mouth 2 (two) times daily. 180 capsule 2   Magnesium 400 MG CAPS Take 400 mg by mouth daily. 30 capsule 0   metoprolol tartrate (LOPRESSOR) 25 MG tablet TAKE ONE TABLET BY MOUTH TWICE DAILY 180 tablet 3   nitroGLYCERIN (NITROSTAT) 0.4 MG SL tablet Place 1 tablet (0.4 mg total) under the tongue every 5 (five) minutes as needed for chest pain. 25 tablet 1   omeprazole (PRILOSEC) 40 MG capsule Take 1 capsule by mouth daily.     sertraline (ZOLOFT) 100 MG tablet TAKE 1 TABLET EACH DAY. 90 tablet 0   Nintedanib (OFEV) 100 MG CAPS Take 1 capsule (100 mg total) by mouth 2 (two) times daily. (Patient not taking: Reported on 11/15/2022) 180 capsule 3   No facility-administered medications prior to visit.         Objective:   Physical Exam Vitals:   11/15/22 1405  BP: 122/64  Pulse: 75  Temp: 97.6 F (36.4 C)  TempSrc: Oral  SpO2: 94%  Weight: 267 lb (121.1 kg)    Gen: Pleasant,overwt,  in no distress,  normal affect  ENT: No lesions,  mouth clear,  oropharynx clear, no postnasal drip  Neck: No JVD, no stridor  Lungs: No use of accessory muscles, no crackles or wheezing on normal respiration, no wheeze on forced expiration  Cardiovascular: RRR, heart sounds normal, no murmur or gallops, no peripheral edema  Musculoskeletal: No deformities, no cyanosis or clubbing  Neuro: alert, awake, non focal  Skin: Warm, no lesions or rash       Assessment & Plan:   Viral URI With cough.  No clear evidence for an acute bacterial bronchitis based on symptoms.  Certainly this could be COVID-19.  She has not been tested.  This is day 5 of her symptoms so she would not be a candidate for  antivirals.  I have asked her to mask, isolate, practice good hand hygiene.  We will try to augment her symptomatic relief.  Continue the Mucinex DM, add Tessalon Perles and Atrovent nasal spray.  She will call us if she develops purulent sputum, fever or any symptoms consistent with bronchitis.  If so we will have to tailor any antibiotic therapy to her medication list which includes dofetilide.   Baltazar Apo, MD, PhD 11/15/2022, 2:22 PM [facility] [phone] or if no answer before 7:00PM call [phone] For any issues after 7:00PM please call eLink [phone]

## 2022-11-15 NOTE — Patient Instructions (Signed)
Okay to continue using Mucinex DM as needed We will send a prescription for Tessalon Perles 100 mg up to every 6 hours as you needed for cough suppression. We will send a prescription for Atrovent nasal spray.  Use 2 sprays each nostril 2-3 times a day as needed for congestion and drainage Be careful with exposures.  Wear a mask in practice good handwashing. Please call our office if you develop fevers, any change in the color of your mucus, more shortness of breath.  If so we may need to add to your treatment Follow with Dr. Chase Caller as planned

## 2022-11-15 NOTE — Assessment & Plan Note (Signed)
With cough.  No clear evidence for an acute bacterial bronchitis based on symptoms.  Certainly this could be COVID-19.  She has not been tested.  This is day 5 of her symptoms so she would not be a candidate for antivirals.  I have asked her to mask, isolate, practice good hand hygiene.  We will try to augment her symptomatic relief.  Continue the Mucinex DM, add Tessalon Perles and Atrovent nasal spray.  She will call us if she develops purulent sputum, fever or any symptoms consistent with bronchitis.  If so we will have to tailor any antibiotic therapy to her medication list which includes dofetilide.

## 2022-11-21 ENCOUNTER — Other Ambulatory Visit (HOSPITAL_COMMUNITY): Payer: Self-pay | Admitting: Nurse Practitioner

## 2022-11-21 ENCOUNTER — Ambulatory Visit: Payer: Medicare Other | Admitting: Internal Medicine

## 2022-11-28 ENCOUNTER — Telehealth: Payer: Self-pay | Admitting: Family Medicine

## 2022-11-28 ENCOUNTER — Other Ambulatory Visit: Payer: Self-pay | Admitting: Family Medicine

## 2022-11-28 DIAGNOSIS — Z6841 Body Mass Index (BMI) 40.0 and over, adult: Secondary | ICD-10-CM

## 2022-11-28 NOTE — Telephone Encounter (Signed)
Last OV 10/28/22  Last refill per [facility] 09/20/20 by Dr Lyndel Safe  Next appt: none scheduled

## 2022-11-28 NOTE — Telephone Encounter (Signed)
Last OV 10/28/22   Last refill per [facility] 09/20/20 by Dr Lyndel Safe   Next appt: none scheduled

## 2022-11-28 NOTE — Telephone Encounter (Signed)
Patient is wondering if Dr.Burchette can become the prescribing provider on her diazepam (VALIUM) 5 MG tablet  and send in a refill. Unfortunately, patient has just found out that her granddaughter has stage four cancer and she has no more refills. Patient states that she has been getting this prescribed by Kateri Mc, along with other medications.   I let patient know she needs to contact pharmacy and have them send over request for Dr.Burchette to become the prescribing provider on her medications that are not given to her by specialist, so the medications can be switched all at once. Patient stated she would call now and let them know about the switch.    Please send to  [place], [place] [address] Phone: [phone]  Fax: [phone]        Please advise

## 2022-12-11 ENCOUNTER — Other Ambulatory Visit: Payer: Self-pay | Admitting: Family Medicine

## 2022-12-11 ENCOUNTER — Telehealth: Payer: Self-pay | Admitting: *Deleted

## 2022-12-11 DIAGNOSIS — F331 Major depressive disorder, recurrent, moderate: Secondary | ICD-10-CM

## 2022-12-11 NOTE — Telephone Encounter (Signed)
Eliquis patient assistance (provider portion) faxed to [facility]

## 2022-12-12 ENCOUNTER — Ambulatory Visit (HOSPITAL_COMMUNITY): Payer: Medicare Other | Attending: Internal Medicine

## 2022-12-12 DIAGNOSIS — Z8679 Personal history of other diseases of the circulatory system: Secondary | ICD-10-CM

## 2022-12-12 DIAGNOSIS — R0609 Other forms of dyspnea: Secondary | ICD-10-CM | POA: Diagnosis not present

## 2022-12-12 LAB — ECHOCARDIOGRAM COMPLETE
Area-P 1/2: 2.84 cm2
S' Lateral: 3.3 cm

## 2022-12-15 NOTE — Progress Notes (Signed)
Very mild heart muscle wall stiffness that can contribute to dyspnea. Will discus results at face to face visit. Will not call them in

## 2022-12-18 ENCOUNTER — Telehealth: Payer: Self-pay | Admitting: Internal Medicine

## 2022-12-18 DIAGNOSIS — G4733 Obstructive sleep apnea (adult) (pediatric): Secondary | ICD-10-CM

## 2022-12-18 NOTE — Telephone Encounter (Signed)
Called and spoke with [facility] with [employer] and confirmed that she received her last CPAP 01/22/2016. I called and spoke with the patient and advised that she is due for a new CPAP per [employer] and would get Dr. Chase Caller  to ok [place] to order her a new machine.  She states her machine is broken and is having to go without it and sleep in the recliner currently.  I advised that I would have one of the NP ok the order and send it over urgently.  She verbalized understanding.  Beth, please advise if ok to order new CPAP for patient?  MR is not reachable and her machine is broken.  Thank you.

## 2022-12-18 NOTE — Telephone Encounter (Signed)
Ok to put order in for replacement CPAP

## 2022-12-19 ENCOUNTER — Telehealth: Payer: Self-pay | Admitting: Internal Medicine

## 2022-12-19 NOTE — Telephone Encounter (Signed)
Patient stated she is waiting for a call from the nurse regarding her cpap machine.  Please advise.  CB# [phone]

## 2022-12-20 NOTE — Telephone Encounter (Signed)
Spoke with pt who wanted an update on replacement C-Pap. RN verified that order for replacement C-Pap was sent to [employer] on 12/18/22. Pt given [employer]'s contact information to follow up. Nothing further needed at this time.

## 2022-12-24 DIAGNOSIS — Z01419 Encounter for gynecological examination (general) (routine) without abnormal findings: Secondary | ICD-10-CM | POA: Diagnosis not present

## 2022-12-24 DIAGNOSIS — N958 Other specified menopausal and perimenopausal disorders: Secondary | ICD-10-CM | POA: Diagnosis not present

## 2022-12-24 DIAGNOSIS — Z124 Encounter for screening for malignant neoplasm of cervix: Secondary | ICD-10-CM | POA: Diagnosis not present

## 2022-12-26 ENCOUNTER — Telehealth: Payer: Self-pay | Admitting: Internal Medicine

## 2022-12-26 NOTE — Telephone Encounter (Signed)
Message from [employer]: Darlina Guys  [place], Octavia Heir, [place]; Debbe Mounts Good morning! We are working on this patient's replacement CPAP order; however, her insurance requires office visit notes that document current PAP usage, compliance and benefiting from therapy.  We aren't finding that in Epic.  Can you help please? Thanks! Melissa  Patient may need a new OV for dme to be processed.

## 2022-12-26 NOTE — Telephone Encounter (Signed)
I am not a sleep physician.  This will have to be processed through her sleep doctor.

## 2022-12-26 NOTE — Telephone Encounter (Signed)
Pt has not had an actual visit for her cpap in at least 2 years. Pt will need to be set up with a visit for OSA follow up with either [facility] or Dr. Annamaria Boots.  Called and spoke with pt letting her know this info and let her know that without an appt with either APP or Dr. Annamaria Boots that [employer] would not be able to replace her cpap machine and she verbalized understanding. Appt scheduled for pt with Dr. Annamaria Boots. Nothing further needed.

## 2022-12-29 NOTE — Progress Notes (Deleted)
HPI  female former smoker followed for dyspnea on exertion, lung nodules, fibrosis, complicated by A. 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 She does not qualify for portable oxygen CT chest 01/06/17-pulmonary fibrosis, small nodules ANA 1:160 ------------------------------------------------------------------------------------------ 09/21/2018- 82 year old female former smoker followed for dyspnea on exertion, lung nodules, fibrosis, ANA [id number], complicated by A. 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 Feeling Sob with activity today. Previously completed pulmonary rehab. Body weight 250 lbs No acute changes in her breathing.  Notices a full/bloated feeling especially after meals, but little wheeze or cough.  Still followed by cardiology for A. Fib/pacemaker. Gradually adjusting to loss of daughter who died from end-stage lung disease related to her alpha-1 antitrypsin status. CT chest 04/03/2018- IMPRESSION: 1. Pulmonary nodules are stable to slightly decreased back to 01/06/2017 chest CT, considered benign. 2. 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 No frank honeycombing. No convincing progression since 01/06/2017 high-resolution chest CT study. 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 Usual interstitial pneumonia is unlikely given absence of progression and presence of air trapping. 3. Stable mild patchy air trapping in both lungs indicative of small airways disease. Aortic Atherosclerosis (ICD10-I70.0).  01/21/2019- 82 year old female former  smoker followed for dyspnea on exertion, lung nodules, Fibrosis, ANA [id number], complicated by A. 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 -----COPD: breathing has been a little worse but under more stress recently - selling house, gained some weight.  Not sure if breathing related to lungs or heart Body weight today 261 pounds Bevespi, Asencion Islam, Moving to [facility], where there will be exercise opportunities. We discussed ILD concern. CXR 09/21/2018- No active lung disease. Stable chronic change and mild cardiomegaly with permanent pacemaker.  12/31/22- 82 yoF being followed by MR and NPs since I last saw her  ROS-see HPI   + = positive Constitutional:    weight loss, night sweats, fevers, chills, fatigue, lassitude. HEENT:    headaches, difficulty swallowing, tooth/dental problems, sore throat,       sneezing, itching, ear ache, nasal congestion, post nasal drip, snoring CV:    chest pain, orthopnea, PND, swelling in lower extremities, anasarca,                                                dizziness, palpitations Resp:   +shortness of breath with exertion or at rest.                +productive cough,   non-productive cough, coughing up of blood.              change in color of mucus.  wheezing.   Skin:    rash or lesions. GI:  No-   heartburn, indigestion, abdominal pain, nausea, vomiting, diarrhea,                 change in bowel habits, loss of appetite GU: dysuria, change in color of urine, no urgency or frequency.   flank pain. MS:  joint pain, stiffness, decreased range of motion, back pain. Neuro-     nothing unusual Psych:  change in mood or affect.  depression or anxiety.   memory loss.  OBJ- Physical Exam General- Alert, Oriented, Affect-appropriate, Distress- none acute, + obese Skin- rash-none, lesions- none, excoriation- none Lymphadenopathy- none Head- atraumatic            Eyes- Gross vision intact,  PERRLA, conjunctivae and secretions clear            Ears- Hearing, canals-normal            Nose- Clear, no-Septal dev, mucus, polyps, erosion, perforation             Throat- Mallampati II , mucosa clear , drainage- none, tonsils- atrophic Neck- flexible , trachea midline, no stridor , thyroid nl, carotid no bruit Chest - symmetrical excursion , unlabored           Heart/CV- RRR , no murmur , no gallop  , no rub, nl s1 s2                           - JVD- none , edema- none, stasis changes- none, varices- none           Lung- + clear, wheeze- none, cough+ slight dry, dullness-none, rub- none           Chest wall- +Pacemaker Left Abd-  Br/ Gen/ Rectal- Not done, not indicated Extrem- cyanosis- none, clubbing, none, atrophy- none, strength- nl Neuro- grossly intact to observation

## 2022-12-31 ENCOUNTER — Ambulatory Visit: Payer: Medicare Other | Admitting: Internal Medicine

## 2022-12-31 ENCOUNTER — Encounter: Payer: Self-pay | Admitting: Internal Medicine

## 2022-12-31 ENCOUNTER — Ambulatory Visit (INDEPENDENT_AMBULATORY_CARE_PROVIDER_SITE_OTHER): Payer: Self-pay | Admitting: Internal Medicine

## 2022-12-31 VITALS — BP 94/58 | HR 75 | Temp 97.8°F | Ht 67.0 in | Wt 260.0 lb

## 2022-12-31 DIAGNOSIS — J8410 Pulmonary fibrosis, unspecified: Secondary | ICD-10-CM

## 2022-12-31 DIAGNOSIS — J84112 Idiopathic pulmonary fibrosis: Secondary | ICD-10-CM

## 2022-12-31 DIAGNOSIS — G4733 Obstructive sleep apnea (adult) (pediatric): Secondary | ICD-10-CM | POA: Diagnosis not present

## 2022-12-31 NOTE — Progress Notes (Signed)
_0  ID: [name] [name], female    DOB: 05/01/1941, 82 y.o.   MRN: [id number]  Chief Complaint  Patient presents with   Follow-up    Pt. Says she's had some bleeding.     Referring provider: [name], MD  HPI: 82 year old female former smoker quit in 1971.  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 Patient of Dr. Chase Caller, last seen by pulmonary NP on 04/10/21.   Previous [facility] pulmonary encounter: 04/10/2021 follow-up; Bronchitis, Pulmonary fibrosis Patient returns for a follow-up visit, patient was seen last week for cough, congestion, shortness of breath and chills.  COVID-19 testing was negative.  Patient had been fully vaccinated for COVID-19.  She had no increased oxygen demands.  Patient was given doxycycline for 7 days and prednisone 20 mg for 5 days. Patient complains ongoing cough and congestion , mucus remains green on/off. No fever.  Eating well , no n/v.d. No hemoptysis , orthopnea or edema.  Called in over the weekend and was called in steroid burst. Currently on Prednisone 22m daily .  Chest xray showed chronic changes with no acute process.  [place]   04/17/2021 Patient presents today follow-up with PFTs. She did not have PFTs done today because she is still not feeling well. She completed course of doxycycline and has two days left of prednisone. Reports "cold symptoms" described as sinus drainage and cough. Sputum has not real purulence. Energy level is low. States that she feels her head is not right. She is using Flonase ans Claritin. She is short of breath. She completed doxycycline twice with no improvement. She is taking mucinex twice a day. She has not used albuterol rescue inhaler. Her ex husband died two weeks ago, she found out two days ago. Denies depressed feelings.    05/15/2021 -  Dr. RChase CallerType of  visit: Telephone/Video [name]89y.o. - changed face to face visit to telephone visit due to > 47F heat. Wantd to know PFT resuts June 2022 copared to sept 2021. FVC down 1.6%, DLCO down 2.1% (compared to y  2years ago FVC declined 7.7%). On Esbeit since Dec 2021/Jan 2022.  Says Dr C sarted on her ditliazem 04/18/21 and after that esbriet became more intolerable. She had constipationa and retain fluids.  So, she stopped all her medications except  eliquis and anti depressant and anti histamine. This means she stopped esbriet approx a week ago. Says that for several days still not feel better though better today. She is unsure if today is a good day v bad day. But overall feels A Fib is acting up. She wants to restarrtrt esbriet after good w wash out.   She is now using o2 at night but feels is not helping energy levels at day time. I Advised she could return it and then she said is actually helping her and does not want to return it.    08/14/21- Dr. RChase CallerChief Complaint  Patient presents with   Follow-up    Pt states she has been doing okay since last visit. Still becomes SOB with exertion.     [name]89y.o. -returns for follow-up.  Since her last visit because she was having calls with side effects from pirfenidone.  She stopped it.  She says after that she started feeling better.  Despite this she is  reporting progressive decline in dyspnea.  Dyspnea score is listed below.  She uses oxygen with exertion as needed.  I at night she uses CPAP without oxygen.  She is frustrated with pirfenidone.  She asked about the alternative nintedanib.  She recollects that there constraints with it in the setting of heart disease and anticoagulation.  Explained that one of the study showed 0.5% increase incidence and MI with nintedanib.  But not the other studies.  Explained that surgical mechanism of anticoagulation but clinically patient have tolerated.  Explained about GI side effects  particularly diarrhea.  She is nervous and hesitant but finally we took a shared decision making that she would start it at a lower dose given the fact it can be beneficial in reducing risk of progression.  We also discussed various aspects of clinical trials documented below.  She is interested in this as long as it is a medication with a much better side effect profile.  But at this point in time we took a shared decision making to go with standard of care therapy first.  She will have the flu shot today.  She is very nervous about getting mRNA COVID booster because of the side effect profile.  She had significant side effects apparently.  She also had omicron COVID in January 2022 and she is wondering about natural immunity.  However she is aware is COVID everywhere currently.  We took a shared decision making to check IgG antibody levels and adjust the risk threshold accordingly.  09/18/2021- Interim hx  Patient presents today for acute visit for medication reaction to OFEV. Patient called our office yesterday with reports of rectal bleeding and abdominal pain since starting 09/17/21. She is newly on OFEV since 10/13 and is also on Eliquis. Our office advised her to stop OFEV. Rectal bleeding has since resolved, she still has some slight lower abdomen discomfort described as "bloating".  Breathing wise she is stable. She uses POC 2L with exertion as needed.  She is on oxygen at night with her CPAP. Denies f/c/s, diarrhea, nausea or vomitting.     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  Small pulmonary nodules measuring 5 mm or less stable dating back to 2018 consistent with a benign etiology.  OV 09/26/2021  Subjective:  [name], female , DOB: 1941-08-27 , age 27 y.o. , MRN: [id number] , ADDRESS: [address] [place] [address] PCP [name], MD Patient Care Team: [name], MD as  PCP - General (Family Medicine) Sanda Klein, MD as PCP - Cardiology (Cardiology) Maisie Fus, MD as Consulting Physician (Obstetrics and Gynecology) Richmond Campbell, MD as Consulting Physician (Gastroenterology) Croitoru, Dani Gobble, MD as Consulting Physician (Cardiology) Kathie Rhodes, MD (Inactive) as Consulting Physician (Urology) Danella Sensing, MD as Consulting Physician (Dermatology) Brand Males, MD as Consulting Physician (Pulmonary Disease) Bo Merino, MD as Consulting Physician (Rheumatology) Barbaraann Cao, OD as Referring Physician (Optometry) Berenice Primas, MD as Referring Physician (Orthopedic Surgery)  This Provider for this visit: Treatment Team:  Attending Provider: Brand Males, MD    09/26/2021 -   Chief Complaint  Patient presents with   Follow-up    PFT performed today.  Pt states she has been doing okay since last visit.   Follow-up IPF   - sept 09, 2021 update - IPF dx givien: age greater than 4, only trace positive ANAdescription of small hiatal hernia on CT  scan, the type of pattern on the CT scan called probable UIP, autoimmune antibodies negative last year    - last CT July 2021   -last PFT sept 201  - portable o2 since sept 2021  -esbriet start end sept 2021 -> stopped 11/02/20 due to fatigue -> rstar 11/09/2020 -. Max dose 2 pilll tid since Dec 02, 2020 -> intermittent start/stop summer 2022 -> stopped aug 2022  - Overnight pulse oximetry done on 03/12/2021 shows time spent less than equal to 88% is 40.3 minutes.  Average pulse was 60/min.  Time spent in bradycardia 72.5%. Start 2 L nasal cannula at night  Multiple Lung Nodules 69m- stable 202-10-2017-> July 2021 Alpha-1 MZ but does not have emphysema on CT 202-11-21  [Daughter died from alpha-1 CC] Obesity  Diastolic dysfunction Atrial fibrillation on Eliquis Obstructive sleep apnea on CPAP without oxygen COVID-19 incidental 12/28/2020 - likely Omicron4  HPI BKEYERRA LAMERE89y.o.  -presents ith daughter ? SJeralyn Ruthsmeeting first time. Doing stable from symptom stand point resp wise but PFT down significantly. Took ofev for 5 days and there are aspectso of taking ofev (1023mbid - the lower dose) tht she liked. She felt her urinary stream had better flow (not a known side effect). She had lower appetite (known Se with ofev) but she liked the curb on appetie it due to her obesity. The 5d into she had had mild discomfort in suprapubic area (known SE for abd discmofort) and had 1 drop of painless rectal bleed. So stopped ofev (also on eliquisl bleeding theoertical risk with ofev). After stopping ofev, urinary flow is back to baseline(does not like it), and no mpre rectal bleeding . She never had rectal bleeding before and afer. She again stated and made clear the so called bleeding was "1 drop of blood ont he floor". Associted with ofev intake on background of eliquis and associted with suprapubic discomfor. No dysuria. Dr MaBrien Matess her GI. Last colonoscopy was when she ws 7562nd was normal apparently  Discussed  - seeing a GI doc for rectal bleed - she is agreeable   - discussed rechallenge with ofev v joining clinical trial phase 3( to be volunteer + exploit therapeutic advantage with 50% chance of placebo) Discussed other asoects of trials. Explained the 091995/02/11ephrysu study with IV MAB against CTGF Pamrevulumab v placeo.  See below. She and dauhger want her to do clinical trial but are more enthused about the idea of a ofev rechallenge first. We discussed that rechallenge liketly to cause side effects but anecdotally seems some patients who do not have issues. She is not interested in esbriet rechallenge       OV 10/10/2021  Subjective:  Patient ID: BrMarcellina Millinfemale , DOB: 11May 19, 1942 age 82.o. , MRN: [id number] [address] MiKathyrn LassMD Patient Care Team: MiKathyrn LassMD as PCP - General (Family  Medicine) CrSanda KleinMD as PCP - Cardiology (Cardiology) NeMaisie FusMD as Consulting Physician (Obstetrics and Gynecology) MeRichmond CampbellMD as Consulting Physician (Gastroenterology) Croitoru, MiDani GobbleMD as Consulting Physician (Cardiology) OtKathie RhodesMD (Inactive) as Consulting Physician (Urology) JoDanella SensingMD as Consulting Physician (Dermatology) RaBrand MalesMD as Consulting Physician (Pulmonary Disease) DeBo MerinoMD as Consulting Physician (Rheumatology) MiBarbaraann CaoOD as Referring Physician (Optometry) GrBerenice PrimasMD as Referring Physician (Orthopedic Surgery)  This Provider for this visit: Treatment Team:  Attending Provider:  Brand Males, MD    10/10/2021 -   Chief Complaint  Patient presents with   Follow-up    Patient states that  she is doing good with the Ofev and is currently not having any side effects. States that she has been coughing a lot lately. Wants to know if she is due for pneumonia vaccine.   Type of visit: Telephone/Video Circumstance: COVID-19 national emergency Identification of patient YARISA LYNAM with November 19, 1941 and MRN [id number] - 2 person identifier Risks: Risks, benefits, limitations of telephone visit explained. Patient understood and verbalized agreement to proceed Anyone else on call: just patient Patient location: her home This provider location: [address], [place], [place] [address]   HPI PRANAVI AURE 82 y.o. -at this point telephone visit is to see how rechallenge with nintedanib is ongoing.  Since 10/06/2021 Saturday she is on 100 mg twice daily.  She says she has not had any recurrence of bleeding.  No GI symptoms.  Tolerating it well.  Dyspnea is not any worse.  No new issues.  Noticed mild reduction in GFR in October 2022.  Liver function test and hemoglobin are fine.             OV 11/09/2021  Subjective:  [name], female , DOB: October 26, 1941 , age 66 y.o.  , MRN: [id number] , ADDRESS: [address] [place] [address] PCP [name], MD Patient Care Team: [name], MD as PCP - General (Family Medicine) Croitoru, Dani Gobble, MD as PCP - Cardiology (Cardiology) Maisie Fus, MD as Consulting Physician (Obstetrics and Gynecology) Richmond Campbell, MD as Consulting Physician (Gastroenterology) Croitoru, Dani Gobble, MD as Consulting Physician (Cardiology) Kathie Rhodes, MD (Inactive) as Consulting Physician (Urology) Danella Sensing, MD as Consulting Physician (Dermatology) Brand Males, MD as Consulting Physician (Pulmonary Disease) Bo Merino, MD as Consulting Physician (Rheumatology) Barbaraann Cao, OD as Referring Physician (Optometry) Berenice Primas, MD as Referring Physician (Orthopedic Surgery)  This Provider for this visit: Treatment Team:  Attending Provider: Brand Males, MD    11/09/2021 -   Chief Complaint  Patient presents with   Follow-up    Pt states she is about the same since last visit. Still becomes SOB with exertion.    HPI JENNALEE GREAVES 82 y.o. -returns for follow-up.  She restarted nintedanib on 10/06/2021 at the lower dose.  She tells me that she has not had any rectal bleeding except yesterday there was again a few drops.  No abdominal pain.  Last visit I told her to go see GI doctor but she has not.  Her GI doctor was Dr. Cristina Gong in [place] but has since retired.  Advised that that I will make a referral.  In terms of shortness of breath she is stable.  She believes she is tolerating the nintedanib fine.  She is actually gained some weight.  She also reports that for the last 2 weeks she is on metoprolol because of cardiac rhythm issues.  At today blood pressure was 666 systolic.Marland Kitchen  She tells me this is high for her.  She has never had this problem before according to history.  But the observations only today.  She acknowledges that this could be whitecoat hypertension as well.  Last  CT scan of the chest July 2021 Last pulmonary function test October 2022  She is not fully compliant with the portable oxygen.     No results found.     OV 05/31/2022  Subjective:  Patient ID: ADALIDA GARVER, female , DOB: 09/09/1941 , age 25 y.o. , MRN: [id number] , ADDRESS: [address] [place] [address] PCP [name], MD Patient Care Team: [name], MD as PCP - General (Family Medicine) Sanda Klein, MD as PCP - Cardiology (Cardiology) Maisie Fus, MD as Consulting Physician (Obstetrics and Gynecology) Richmond Campbell, MD as Consulting Physician (Gastroenterology) Croitoru, Dani Gobble, MD as Consulting Physician (Cardiology) Kathie Rhodes, MD (Inactive) as Consulting Physician (Urology) Danella Sensing, MD as Consulting Physician (Dermatology) Brand Males, MD as Consulting Physician (Pulmonary Disease) Bo Merino, MD as Consulting Physician (Rheumatology) Barbaraann Cao, OD as Referring Physician (Optometry) Berenice Primas, MD as Referring Physician (Orthopedic Surgery)  This Provider for this visit: Treatment Team:  Attending Provider: Brand Males, MD   05/31/2022 -   Chief Complaint  Patient presents with   Follow-up    Pt states she is still having problems with her breathing. Also states that she feels like she is more constipated than before and feels like she might have some internal bleeding.     HPI MERIAH SHANDS 82 y.o. -on finger for several months.  She believes IPF is slowly getting worse.  Review of the symptom score shows that symptoms are definitely worse in the last 2 years but compared to 6 months ago around the same.  Shared high-resolution CT chest that shows progression of ILD over 2 years.  I shared the results with her.  This fits in with the symptoms also getting worse over the last 2 years.  She is worried about the progression.  In the past we recommended she consider clinical trial as a care  option but she declined.  This is still under consideration for her but she has not made up her mind.  Last echocardiogram was a few years ago.  Of note she has had rectal bleeding last year.  I referred her to Dr. Roena Malady and GI.  She had virtual colonoscopy.  She asked me to read the report.  It shows she has diverticulosis but does not seem anything hugely abnormal.  She is unsure if she still having rectal bleeding.  She thinks she might be.  She wants to switch GI doctors   She also tells me that since last visit she is on a new medicine for atrial fibrillation.  Tikosyn.  She wanted me to palpate a pulse.  I did this for 30 seconds and told her at least for the 30 seconds she is in sinus rhythm.     OV 09/03/2022  Subjective:  [name], female , DOB: 1941-05-28 , age 33 y.o. , MRN: [id number] , ADDRESS: [address] [place] [address] PCP [name], MD Patient Care Team: [name], MD as PCP - General (Family Medicine) Sanda Klein, MD as PCP - Cardiology (Cardiology) Maisie Fus, MD (Inactive) as Consulting Physician (Obstetrics and Gynecology) Richmond Campbell, MD as Consulting Physician (Gastroenterology) Croitoru, Dani Gobble, MD as Consulting Physician (Cardiology) Kathie Rhodes, MD (Inactive) as Consulting Physician (Urology) Danella Sensing, MD as Consulting Physician (Dermatology) Brand Males, MD as Consulting Physician (Pulmonary Disease) Bo Merino, MD as Consulting Physician (Rheumatology) Barbaraann Cao, OD as Referring Physician (Optometry) Berenice Primas, MD as Referring Physician (Orthopedic Surgery)  This Provider for this visit: Treatment Team:  Attending Provider: Brand Males, MD   09/03/2022 -   Chief Complaint  Patient presents with   Follow-up    Breathing is  about the same. She has some dry cough in the am.      HPI NAJAH LIVERMAN 82 y.o. -returns for follow-up.  Today's visit has  been compressed because instead of scheduling on 30 minutes she is on a 15-minute schedule.  For some reason the pulmonary function test is also not done.  In addition scheduling at the front desk was late.  Overall any event she is feeling stable she is tolerating 100 mg twice daily of nintedanib.  She is doing physical therapy and swimming and she is feeling stronger.  She does have some mild diarrhea on and off.  She has not had any more bright blood per rectum.  She is not sure if a GI appointment has gone through.  She says she is still waiting for that appointment.  Last set of labs was in August 2023.  She does have some amount of CKD  Review of the vaccination shows that she will have high-dose flu shot today she is agreed for this.  She is hesitant to get COVID mRNA vaccine.    CT Chest data  No results found.   OV 12/31/2022  Subjective:  [name], female , DOB: Aug 24, 1941 , age 45 y.o. , MRN: [id number] , ADDRESS: [address] [place] [address] PCP Eulas Post, MD Patient Care Team: Eulas Post, MD as PCP - General (Family Medicine) Croitoru, Dani Gobble, MD as PCP - Cardiology (Cardiology) Maisie Fus, MD (Inactive) as Consulting Physician (Obstetrics and Gynecology) Richmond Campbell, MD as Consulting Physician (Gastroenterology) Croitoru, Dani Gobble, MD as Consulting Physician (Cardiology) Kathie Rhodes, MD (Inactive) as Consulting Physician (Urology) Danella Sensing, MD as Consulting Physician (Dermatology) Brand Males, MD as Consulting Physician (Pulmonary Disease) Bo Merino, MD as Consulting Physician (Rheumatology) Barbaraann Cao, OD as Referring Physician (Optometry) Berenice Primas, MD as Referring Physician (Orthopedic Surgery)  This Provider for this visit: Treatment Team:  Attending Provider: Brand Males, MD   Follow-up IPF   - sept 09, 2021 update - IPF dx givien: age greater than 57, only  trace positive ANAdescription of small hiatal hernia on CT scan, the type of pattern on the CT scan called probable UIP, autoimmune antibodies negative last year    - last CT July 2021   -last PFT October 2022  - portable o2 since sept 2021  -esbriet start end sept 2021 -> stopped 11/02/20 due to fatigue -> rstar 11/09/2020 -. Max dose 2 pilll tid since Dec 02, 2020 -> intermittent start/stop summer 2022 -> stopped aug 2022  - OFEV start Nov 2022  - low dose   Nocutruanl hyoxeia   - Overnight pulse oximetry done on 03/12/2021 shows time spent less than equal to 88% is 40.3 minutes.  Average pulse was 60/min.  Time spent in bradycardia 72.5%. Start 2 L nasal cannula at night  Multiple Lung Nodules 76m- stable 2Mar 24, 2018-> July 2021 Alpha-1 MZ but does not have emphysema on CT 22021/03/24  [Daughter died from alpha-1 CC] Obesity  Diastolic dysfunction Atrial fibrillation on Eliquis Obstructive sleep apnea on CPAP without oxygen COVID-19 incidental 12/28/2020 - likely Omicron4  12/31/2022 -   Chief Complaint  Patient presents with   Follow-up    She missed her appt for PFT today. Breathing is worse over the past 5-6 wks. She is getting her resp virus- took mucinex and her cough has improved. She states that she gets winded walking approx 15 ft. She is exercising 4  x per wk- has lost seven lbs since last visit here 11/15/22. She stopped OFEV approx 2 months ago due to diarrhea.     HPI JODE LIPPE 82 y.o. -returns for follow-up.  In the interim she stopped nintedanib because of diarrhea.  This was 2 months ago.  She is exercising and has lost some weight see below.  Symptoms are stable.  Walking desaturation test is stable.  She did not show up for a pulmonary function test today.  She tells me that the scheduler confused her when she called to confirm the appointment.  They told her the appointment is 1:00 but they forgot to inform her about the PFT and therefore she forgot about the PFT.  This is  according to her.  At this point in time she is content with supportive care without any antifibrotic.  She is not interested in clinical trials.  I discussed this again with her. Unable to do glutathione OPTC due to lack of availabiity   Last Weight  Most recent update: 12/31/2022  1:31 PM    Weight  117.9 kg (260 lb)               SYMPTOM SCALE - ILD 07/21/2019   08/10/2020 262# 09/26/2020 Last Weight  Most recent update: 09/26/2020  1:42 PM      Weight  120.9 kg (266 lb 9.6 oz)                     11/09/2020 274# 01/23/2021 279# 08/14/2021 275# 09/18/2021 Started OFEV 10/13 but stoped 10/17 d/t GIB  09/26/2021 Off ofev. No more "rectal bleed" 11/09/2021 Back on ofev '100mg'$  bid since 10/06/21  280# 05/31/2022 276# 12/31/2022 260#  O2 use ra ra ra o2 with ex 02 with ex   O2 with exertoin      Shortness of Breath 0 -> 5 scale with 5 being worst (score 6 If unable to do)                 At rest 0 0   5 0 4 0 0 0 0 0  Simple tasks - showers, clothes change, eating, shaving '4 3   2 2 2 5 '$ 3.'5 4 4 3  '$ Household (dishes, doing bed, laundry) '4 3   2 3 2 4 4 4 4 '$ x  Shopping '5 3   2 3 2 '$ 4.5 4 4.'5 4 5  '$ Walking level at own pace '5 3   2 3 2 4 5 '$ 4.'5 3 3  '$ Walking up Stairs '5 5   1 5 6 6 6 6 6 5  '$ Total (40 - 48) Dyspnea Score '23 17   14 16 18 '$ 23.5 22.'5 23 21 16  '$ How bad is your cough? 1 1-'3 1 1 2 2 1 1  1 2  '$ How bad is your fatigue '4 3 3 4 4 3 3 '$ 3.'5 3 4 4  '$ nausea   0 0 0 0 0 0 0 0 0.25 2.5  vomit   0 0 0 0 0 0 0 0 0 1.5  diarrhea   0 0 0 1 0 0.5 0 00 Once in a while constipaged  axiety   2 1 0 0 0.5 0.5 0 0 0 x  depression   2 1.'5 2 2 2 1 '$ 0.5-'1 1 3 '$ 3+        Simple office walk 185 feet x  3 laps goal with forehead probe 07/21/2019  08/10/2020   01/23/2021   09/18/2021  05/31/2022  12/31/2022 No show for PFT  O2 used ra ra ra RA ra ra  Number laps completed 3 laps '3 3 2 '$ Only 1 of 3 laps Did  2 of 3  Comments about pace modertae with walker Slow pace Sow pace Slow pace slow   Resting  Pulse Ox/HR 97% and 78/min 100% and 74/min 94%and HR 92 96% RA and HR 80 98% and HR 64 98%   Final Pulse Ox/HR 93% and 109/min 77% and 85/min 90 % and HR 100 90% and HR 125 97% and HR 105 95%  Desaturated </= 88% no no no No no   Desaturated <= 3% points Yes, 4 Yes, 13 points Yes, 4 piints Yes, 6 points no   Got Tachycardic >/= 90/min you have pulmonary fibrosis otherwise call interstitial lung disease yes no yes Yes yes   Symptoms at end of test dyspnea Mild dyspnea Dyspnea throughtou Dyspnea  Mod dspnea   Miscellaneous comments x Corrected with 2LNC and got    X  Very deconditioned     PFT     Latest Ref Rng & Units 09/26/2021    2:58 PM 05/07/2021    3:12 PM 08/10/2020   10:03 AM 07/19/2019    3:20 PM 02/24/2017   11:58 AM  PFT Results  FVC-Pre L 2.38  2.53  2.57  2.74  2.58   FVC-Predicted Pre % 79  84  84  93  85   FVC-Post L    2.63  2.45   FVC-Predicted Post %    89  81   Pre FEV1/FVC % % 87  86  86  86  82   Post FEV1/FCV % %    90  87   FEV1-Pre L 2.06  2.17  2.22  2.36  2.12   FEV1-Predicted Pre % 92  97  97  107  93   FEV1-Post L    2.35  2.14   DLCO uncorrected ml/min/mmHg 9.76  15.50  15.83  16.52  16.15   DLCO UNC% % 47  74  76  81  59   DLCO corrected ml/min/mmHg 9.48  15.50  15.34   14.93   DLCO COR %Predicted % 45  74  73   55   DLVA Predicted % 77  105  97  104  79   TLC L   4.50  3.97    TLC % Predicted %   81  74    RV % Predicted %   71  52         has a past medical history of Alpha-1-antitrypsin deficiency carrier, Arthritis, Atherosclerosis of aorta (HCC), Atrial fibrillation (HCC), Back pain, Complication of anesthesia, Depression, Diastolic dysfunction, Eating disorder, Frequent UTI, GERD (gastroesophageal reflux disease), Heart disease, Hypertension, Hypertriglyceridemia, Insulin resistance, Long term current use of anticoagulant, Multiple lung nodules, Muscular deconditioning, MVP (mitral valve prolapse), Obesity, OSA on CPAP, Osteoarthritis, Osteopenia,  Pacemaker, PAF (paroxysmal atrial fibrillation) (HCC), Positive ANA (antinuclear antibody), Presbyesophagus, Presence of permanent cardiac pacemaker, Primary osteoarthritis of both hands, Pulmonary fibrosis ([place]), PVC's (premature ventricular contractions), Shortness of breath, Sinus arrest (10/2014), Sleep apnea, Stroke ([place]) (01/2014), Tachy-brady syndrome ([place]) (10/2014), TIA (transient ischemic attack), and Vitamin D deficiency.   reports that she quit smoking about 52 years ago. Her smoking use included cigarettes. She has a 36.00 pack-year smoking history. She has never used smokeless tobacco.  Past Surgical History:  Procedure  Laterality Date   CARDIOVERSION N/A 09/02/2017   Procedure: CARDIOVERSION;  Surgeon: Sanda Klein, MD;  Location: [facility];  Service: Cardiovascular;  Laterality: N/A;   COLONOSCOPY  2019   ESOPHAGEAL DILATION     EXCISION VAGINAL CYST     benign nodules   INSERT / REPLACE / REMOVE PACEMAKER  10/04/2014   Medtronic Advisa model A2DR01 serial number [id number]   LOOP RECORDER EXPLANT N/A 10/04/2014   Procedure: LOOP RECORDER EXPLANT;  Surgeon: Sanda Klein, MD;  Location: [facility];  Service: Cardiovascular;  Laterality: N/A;   LOOP RECORDER IMPLANT N/A 04/19/2014   Procedure: LOOP RECORDER IMPLANT;  Surgeon: Sanda Klein, MD;  Location: [facility];  Service: Cardiovascular;  Laterality: N/A;   NM MYOCAR PERF WALL MOTION  12/18/2007   normal   PERMANENT PACEMAKER INSERTION N/A 10/04/2014   Procedure: PERMANENT PACEMAKER INSERTION;  Surgeon: Sanda Klein, MD;  Location: [facility];  Service: Cardiovascular;  Laterality: N/A;   TUBAL LIGATION     US ECHOCARDIOGRAPHY  05/15/2010   LA mildly dilated,mild mitral annular ca+, AOV mildly sclerotic, mild asymmetric LVH    Allergies  Allergen Reactions   Cefazolin Hives and Itching    Other reaction(s): severe hives   Pseudoephedrine Hcl     Other reaction(s): palpitations (moderate)   Ergotamine      unknown   Other Other (See Comments)   Pentobarbital     unknown   Pirfenidone     unknown   Caffeine Palpitations    Immunization History  Administered Date(s) Administered   Fluad Quad(high Dose 65+) 08/14/2021, 09/03/2022   Influenza Split 10/09/2009, 09/19/2010, 09/13/2020   Influenza Whole 09/23/2008   Influenza, High Dose Seasonal PF 09/13/2018, 09/27/2019, 09/26/2020   Influenza,inj,quad, With Preservative 09/04/2017, 09/05/2021   Influenza-Unspecified 09/17/2014, 09/09/2016, 09/04/2017   Moderna Sars-Covid-2 Vaccination 12/06/2019, 01/03/2020   Pneumococcal Conjugate-13 09/29/2014, 10/16/2015   Pneumococcal Polysaccharide-23 04/06/2009, 10/02/2017   Pneumococcal-Unspecified 10/01/2014, 10/02/2017   Td 04/05/1999   Tdap 04/06/2009   Zoster Recombinat (Shingrix) 09/29/2018, 01/19/2019   Zoster, Live 12/02/2008, 09/29/2018, 01/19/2019    Family History  Problem Relation Age of Onset   Heart attack Mother    Sudden death Mother    Stroke Father    Heart failure Father    Alcoholism Brother    Healthy Sister    Stroke Brother    Alpha-1 antitrypsin deficiency Daughter    Breast cancer Daughter    Prostate cancer Brother    Stroke Brother    Prostate cancer Brother    Healthy Brother    Prostate cancer Brother    Healthy Daughter    Diabetes Son    Arthritis Son      Current Outpatient Medications:    apixaban (ELIQUIS) 5 MG TABS tablet, Take 1 tablet (5 mg total) by mouth 2 (two) times daily., Disp: 180 tablet, Rfl: 1   benzonatate (TESSALON) 100 MG capsule, Take 1 capsule (100 mg total) by mouth every 6 (six) hours as needed for cough., Disp: 30 capsule, Rfl: 1   Cholecalciferol (VITAMIN D3 PO), Take 1 tablet by mouth daily., Disp: , Rfl:    diazepam (VALIUM) 5 MG tablet, TAKE 1 TABLET ONCE A DAY AS NEEDED., Disp: 30 tablet, Rfl: 0   dofetilide (TIKOSYN) 500 MCG capsule, TAKE 1 CAPSULE (500 MCG TOTAL) BY MOUTH 2 (TWO) TIMES DAILY., Disp: 180 capsule, Rfl: 1    ipratropium (ATROVENT) 0.06 % nasal spray, Place 2 sprays into both nostrils 4 (four)  times daily as needed for rhinitis., Disp: 15 mL, Rfl: 12   Magnesium 400 MG CAPS, Take 400 mg by mouth daily., Disp: 30 capsule, Rfl: 0   metoprolol tartrate (LOPRESSOR) 25 MG tablet, TAKE ONE TABLET BY MOUTH TWICE DAILY, Disp: 180 tablet, Rfl: 3   nitroGLYCERIN (NITROSTAT) 0.4 MG SL tablet, Place 1 tablet (0.4 mg total) under the tongue every 5 (five) minutes as needed for chest pain., Disp: 25 tablet, Rfl: 1   omeprazole (PRILOSEC) 40 MG capsule, Take 1 capsule by mouth daily., Disp: , Rfl:    sertraline (ZOLOFT) 100 MG tablet, TAKE ONE TABLET BY MOUTH ONCE DAILY., Disp: 90 tablet, Rfl: 3      Objective:   Vitals:   12/31/22 1326  BP: (!) 94/58  Pulse: 75  Temp: 97.8 F (36.6 C)  TempSrc: Oral  SpO2: 96%  Weight: 260 lb (117.9 kg)  Height: '5\' 7"'$  (1.702 m)    Estimated body mass index is 40.72 kg/m as calculated from the following:   Height as of this encounter: '5\' 7"'$  (1.702 m).   Weight as of this encounter: 260 lb (117.9 kg).  '@WEIGHTCHANGE'$ @  Filed Weights   12/31/22 1326  Weight: 260 lb (117.9 kg)     Physical Exam General: No distress. obese Neuro: Alert and Oriented x 3. GCS 15. Speech normal Psych: Pleasant Resp:  Barrel Chest - no.  Wheeze - no, Crackles - mild, No overt respiratory distress CVS: Normal heart sounds. Murmurs - no Ext: Stigmata of Connective Tissue Disease - no HEENT: Normal upper airway. PEERL +. No post nasal drip        Assessment:       ICD-10-CM   1. Pulmonary fibrosis (HCC)  J84.10 Pulmonary Function Test    2. IPF (idiopathic pulmonary fibrosis) ([place])  J84.112          Plan:     Patient Instructions  IPF (idiopathic pulmonary fibrosis) ([place]) Medication monitoring encounter  - off ofev per pseronal choice due to side effect profile  - clinically same but could be getting worse; need PFT  PLAN - respect no intersest in approved  antifibrotic or trials - check spirometry/dlco in 4 months   Followup - 4 months do spirometry and dlco - return for 30 min visith wth DR Chase Caller in 4 months but after PFT  - walk test and symptoms questionnaire at followup    SIGNATURE    Dr. Brand Males, M.D., F.C.C.P,  Pulmonary and Critical Care Medicine Staff Physician, [facility] Director - Interstitial Lung Disease  Program  Pulmonary [place] at [place], [place], [address]  Pager: [phone], If no answer or between  15:00h - 7:00h: call [phone] Telephone: [phone]  2:08 PM 12/31/2022   (Level 04: Estb 30-39 min   visit type: on-site physical face to visit visit spent in total care time and counseling or/and coordination of care by this undersigned MD - Dr Brand Males. This includes one or more of the following on this same day 12/31/2022: pre-charting, chart review, note writing, documentation discussion of test results, diagnostic or treatment recommendations, prognosis, risks and benefits of management options, instructions, education, compliance or risk-factor reduction. It excludes time spent by the [facility] or office staff in the care of the patient . Actual time is 30 min)

## 2022-12-31 NOTE — Patient Instructions (Addendum)
IPF (idiopathic pulmonary fibrosis) (HCC)   - off ofev per pseronal choice due to side effect profile  - clinically same but could be getting worse; need PFT  PLAN - respect no intersest in approved antifibrotic or trials - check spirometry/dlco in 4 months   Followup - 4 months do spirometry and dlco - return for 30 min visith wth DR Chase Caller in 4 months but after PFT  - walk test and symptoms questionnaire at followup

## 2023-01-01 ENCOUNTER — Telehealth: Payer: Self-pay | Admitting: Cardiovascular Disease

## 2023-01-01 NOTE — Telephone Encounter (Signed)
Let's cut back her metoprolol to 12.5 mg twice daily please.

## 2023-01-01 NOTE — Telephone Encounter (Signed)
  Pt c/o BP issue: STAT if pt c/o blurred vision, one-sided weakness or slurred speech  1. What are your last 5 BP readings? 94/58  2. Are you having any other symptoms (ex. Dizziness, headache, blurred vision, passed out)? Dizziness and sob   3. What is your BP issue? Pt is calling to speak withDr. C's nurse due to her low BP. She said she did not take her toprol this morning

## 2023-01-02 ENCOUNTER — Encounter: Payer: Self-pay | Admitting: Cardiovascular Disease

## 2023-01-02 MED ORDER — METOPROLOL TARTRATE 25 MG PO TABS: 12.5000 mg | ORAL_TABLET | Freq: Two times a day (BID) | ORAL | 3 refills | Status: DC

## 2023-01-02 NOTE — Telephone Encounter (Signed)
  Pt said, she missed a call from [facility], she wants to know what it is for. No new notes on file

## 2023-01-02 NOTE — Telephone Encounter (Signed)
Spoke with pt, aware of the recommendations. She reports she is not able to cut in 1/2 as the pill is not scored. New script sent to the pharmacy asking the pharmacy to cut the pill in 1/2 for the patient.

## 2023-01-02 NOTE — Telephone Encounter (Signed)
From the chart seems like a conversation was held.

## 2023-01-03 ENCOUNTER — Ambulatory Visit (INDEPENDENT_AMBULATORY_CARE_PROVIDER_SITE_OTHER): Payer: Medicare Other | Admitting: Adult Health

## 2023-01-03 ENCOUNTER — Encounter: Payer: Self-pay | Admitting: Adult Health

## 2023-01-03 VITALS — BP 90/60 | HR 97 | Temp 97.4°F | Ht 67.0 in | Wt 260.6 lb

## 2023-01-03 DIAGNOSIS — G4733 Obstructive sleep apnea (adult) (pediatric): Secondary | ICD-10-CM | POA: Diagnosis not present

## 2023-01-03 DIAGNOSIS — J9611 Chronic respiratory failure with hypoxia: Secondary | ICD-10-CM

## 2023-01-03 NOTE — Assessment & Plan Note (Signed)
Excellent control and compliance on CPAP.  CPAP machine has gotten old and will need a replacement.  Will continue same settings.  Plan  Patient Instructions  Continue on CPAP with Oxygen  At bedtime   Order for new CPAP machine.  Work on healthy weight  Do not drive if sleepy  Follow up in  1 year and As needed   Please contact office for sooner follow up if symptoms do not improve or worsen or seek emergency care

## 2023-01-03 NOTE — Assessment & Plan Note (Signed)
Continue on oxygen at bedtime with her CPAP.

## 2023-01-03 NOTE — Patient Instructions (Addendum)
Continue on CPAP with Oxygen  At bedtime   Order for new CPAP machine.  Work on healthy weight  Do not drive if sleepy  Follow up in  1 year and As needed   Please contact office for sooner follow up if symptoms do not improve or worsen or seek emergency care

## 2023-01-03 NOTE — Progress Notes (Signed)
$'@Patient'f$  ID: [name] [name], female    DOB: Dec 27, 1940, 82 y.o.   MRN: [id number]  Chief Complaint  Patient presents with   Follow-up    Referring provider: Eulas Post, MD  HPI: 82 year old female former smoker quit in 1971.  She is followed for pulmonary fibrosis, obstructive sleep apnea and alpha 1 antitrypsin deficiency carrier, chronic respiratory failure on oxygen Medical history significant for A-fib and diastolic heart failure Lives at [facility] , Independent Living/house.   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 Small pulmonary nodules measuring 5 mm or less stable dating back to 2018 consistent with a benign etiology.   a split-night protocol on 11/30/2015. On the diagnostic portion of the study, she was found to have moderate obstructive sleep apnea with an AHI of 18.2 per hour.   01/03/2023 Follow up : OSA Patient returns for a follow-up visit.  Patient has underlying sleep apnea and is on nocturnal CPAP.  Patient feels that she benefits from CPAP with decreased daytime sleepiness.  CPAP download shows 87% compliance.  Daily average usage at 6.5 hours.  Patient is on CPAP 11 cm H2O.  AHI is 2.9/hour.  Patient says her CPAP machine is getting old and needs a new machine.     Allergies  Allergen Reactions   Cefazolin Hives and Itching    Other reaction(s): severe hives   Pseudoephedrine Hcl     Other reaction(s): palpitations (moderate)   Ergotamine     unknown   Other Other (See Comments)   Pentobarbital     unknown   Pirfenidone     unknown   Caffeine Palpitations    Immunization History  Administered Date(s) Administered   Fluad Quad(high Dose 65+) 08/14/2021, 09/03/2022   Influenza Split 10/09/2009, 09/19/2010, 09/13/2020   Influenza Whole 09/23/2008   Influenza, High Dose Seasonal PF 09/13/2018, 09/27/2019, 09/26/2020   Influenza,inj,quad, With  Preservative 09/04/2017, 09/05/2021   Influenza-Unspecified 09/17/2014, 09/09/2016, 09/04/2017   Moderna Sars-Covid-2 Vaccination 12/06/2019, 01/03/2020   Pneumococcal Conjugate-13 09/29/2014, 10/16/2015   Pneumococcal Polysaccharide-23 04/06/2009, 10/02/2017   Pneumococcal-Unspecified 10/01/2014, 10/02/2017   Td 04/05/1999   Tdap 04/06/2009   Zoster Recombinat (Shingrix) 09/29/2018, 01/19/2019   Zoster, Live 12/02/2008, 09/29/2018, 01/19/2019    Past Medical History:  Diagnosis Date   Alpha-1-antitrypsin deficiency carrier    Arthritis    "all over"   Atherosclerosis of aorta (HCC)    Atrial fibrillation ([place])    Back pain    Complication of anesthesia    "I woke up during 2 different procedures" (10/04/2014)   Depression    Diastolic dysfunction    Eating disorder    Frequent UTI    GERD (gastroesophageal reflux disease)    Heart disease    Hypertension    Hypertriglyceridemia    Insulin resistance    Long term current use of anticoagulant    Multiple lung nodules    Muscular deconditioning    MVP (mitral valve prolapse)    Obesity    OSA on CPAP    Osteoarthritis    Osteopenia    unspecified location   Pacemaker    PAF (paroxysmal atrial fibrillation) (HCC)    Positive ANA (antinuclear antibody)    Presbyesophagus    Presence of permanent cardiac pacemaker    Primary osteoarthritis of both hands    neck and spine and hips   Pulmonary fibrosis ([place])    followed by  pulmonolgy   PVC's (premature ventricular contractions)    Shortness of breath    Sinus arrest 10/2014   s/p Medtronic Advisa model A2DR01 serial number [id number]   Sleep apnea    Stroke ([place]) 01/2014   denies deficits on 10/04/2014   Tachy-brady syndrome ([place]) 10/2014   TIA (transient ischemic attack)    Vitamin D deficiency     Tobacco History: Social History   Tobacco Use  Smoking Status Former   Packs/day: 2.00   Years: 18.00   Total pack years: 36.00   Types: Cigarettes   Quit  date: 07/18/1970   Years since quitting: 52.4  Smokeless Tobacco Never  Tobacco Comments   Former smoker 07/18/22 quit smoking in 1971   Counseling given: Not Answered Tobacco comments: Former smoker 07/18/22 quit smoking in 1971   Outpatient Medications Prior to Visit  Medication Sig Dispense Refill   apixaban (ELIQUIS) 5 MG TABS tablet Take 1 tablet (5 mg total) by mouth 2 (two) times daily. 180 tablet 1   benzonatate (TESSALON) 100 MG capsule Take 1 capsule (100 mg total) by mouth every 6 (six) hours as needed for cough. 30 capsule 1   Cholecalciferol (VITAMIN D3 PO) Take 1 tablet by mouth daily.     diazepam (VALIUM) 5 MG tablet TAKE 1 TABLET ONCE A DAY AS NEEDED. 30 tablet 0   dofetilide (TIKOSYN) 500 MCG capsule TAKE 1 CAPSULE (500 MCG TOTAL) BY MOUTH 2 (TWO) TIMES DAILY. 180 capsule 1   ipratropium (ATROVENT) 0.06 % nasal spray Place 2 sprays into both nostrils 4 (four) times daily as needed for rhinitis. 15 mL 12   Magnesium 400 MG CAPS Take 400 mg by mouth daily. 30 capsule 0   metoprolol tartrate (LOPRESSOR) 25 MG tablet Take 0.5 tablets (12.5 mg total) by mouth 2 (two) times daily. 90 tablet 3   nitroGLYCERIN (NITROSTAT) 0.4 MG SL tablet Place 1 tablet (0.4 mg total) under the tongue every 5 (five) minutes as needed for chest pain. 25 tablet 1   omeprazole (PRILOSEC) 40 MG capsule Take 1 capsule by mouth daily.     sertraline (ZOLOFT) 100 MG tablet TAKE ONE TABLET BY MOUTH ONCE DAILY. 90 tablet 3   No facility-administered medications prior to visit.     Review of Systems:   Constitutional:   No  weight loss, night sweats,  Fevers, chills,  +fatigue, or  lassitude.  HEENT:   No headaches,  Difficulty swallowing,  Tooth/dental problems, or  Sore throat,                No sneezing, itching, ear ache, nasal congestion, post nasal drip,   CV:  No chest pain,  Orthopnea, PND, swelling in lower extremities, anasarca, dizziness, palpitations, syncope.   GI  No heartburn,  indigestion, abdominal pain, nausea, vomiting, diarrhea, change in bowel habits, loss of appetite, bloody stools.   Resp: .  No chest wall deformity  Skin: no rash or lesions.  GU: no dysuria, change in color of urine, no urgency or frequency.  No flank pain, no hematuria   MS:  No joint pain or swelling.  No decreased range of motion.  No back pain.    Physical Exam  BP 90/60 (BP Location: Left Arm, Patient Position: Sitting, Cuff Size: Large) Comment: Cardiologist cut metoprolol to half tab  Pulse 97   Temp (!) 97.4 F (36.3 C) (Oral)   Ht '5\' 7"'$  (1.702 m)   Wt 260 lb 9.6 oz (118.2 kg)  LMP  (LMP Unknown)   SpO2 93%   BMI 40.82 kg/m   GEN: A/Ox3; pleasant , NAD, well nourished    HEENT:  [place]/AT,   NOSE-clear, THROAT-clear, no lesions, no postnasal drip or exudate noted.   NECK:  Supple w/ fair ROM; no JVD; normal carotid impulses w/o bruits; no thyromegaly or nodules palpated; no lymphadenopathy.    RESP  Clear  P & A; w/o, wheezes/ rales/ or rhonchi. no accessory muscle use, no dullness to percussion  CARD:  RRR, no m/r/g, no peripheral edema, pulses intact, no cyanosis or clubbing.  GI:   Soft & nt; nml bowel sounds; no organomegaly or masses detected.   Musco: Warm bil, no deformities or joint swelling noted.   Neuro: alert, no focal deficits noted.    Skin: Warm, no lesions or rashes    Lab Results:    BMET   BNP   Imaging: ECHOCARDIOGRAM COMPLETE  Result Date: 12/12/2022    ECHOCARDIOGRAM REPORT   Patient Name:   ANNISE BORAN Date of Exam: 12/12/2022 Medical Rec #:  [id number]     Height:       67.0 in Accession #:    [id number]    Weight:       267.0 lb Date of Birth:  May 08, 1941    BSA:          2.286 m Patient Age:    11 years      BP:           117/78 mmHg Patient Gender: F             HR:           84 bpm. Exam Location:  [address] Procedure: 2D Echo, Cardiac Doppler, Color Doppler and Strain Analysis Indications:    R06.00 Dyspnea  History:         Patient has prior history of Echocardiogram examinations, most                 recent 05/18/2019. TIA, Arrythmias:Atrial Fibrillation; Risk                 Factors:Sleep Apnea.  Sonographer:    Marygrace Drought RCS Referring Phys: [place]  1. Left ventricular ejection fraction, by estimation, is 60 to 65%. The left ventricle has normal function. The left ventricle has no regional wall motion abnormalities. Left ventricular diastolic parameters are consistent with Grade I diastolic dysfunction (impaired relaxation).  2. Right ventricular systolic function is normal. The right ventricular size is normal. There is normal pulmonary artery systolic pressure. The estimated right ventricular systolic pressure is 8.0 mmHg.  3. The mitral valve is degenerative. Mild mitral valve regurgitation. No evidence of mitral stenosis.  4. The aortic valve is tricuspid. Aortic valve regurgitation is not visualized. Aortic valve sclerosis is present, with no evidence of aortic valve stenosis.  5. The inferior vena cava is normal in size with greater than 50% respiratory variability, suggesting right atrial pressure of 3 mmHg. FINDINGS  Left Ventricle: Left ventricular ejection fraction, by estimation, is 60 to 65%. The left ventricle has normal function. The left ventricle has no regional wall motion abnormalities. Global longitudinal strain performed but not reported based on interpreter judgement due to suboptimal tracking. The left ventricular internal cavity size was normal in size. There is no left ventricular hypertrophy. Left ventricular diastolic parameters are consistent with Grade I diastolic dysfunction (impaired relaxation). Normal left ventricular filling pressure. Right Ventricle: The right  ventricular size is normal. No increase in right ventricular wall thickness. Right ventricular systolic function is normal. There is normal pulmonary artery systolic pressure. The tricuspid regurgitant velocity  is 1.12 m/s, and  with an assumed right atrial pressure of 3 mmHg, the estimated right ventricular systolic pressure is 8.0 mmHg. Left Atrium: Left atrial size was normal in size. Right Atrium: Right atrial size was normal in size. Pericardium: There is no evidence of pericardial effusion. Mitral Valve: The mitral valve is degenerative in appearance. There is mild calcification of the mitral valve leaflet(s). Mild mitral annular calcification. Mild mitral valve regurgitation. No evidence of mitral valve stenosis. Tricuspid Valve: The tricuspid valve is normal in structure. Tricuspid valve regurgitation is trivial. No evidence of tricuspid stenosis. Aortic Valve: The aortic valve is tricuspid. Aortic valve regurgitation is not visualized. Aortic valve sclerosis is present, with no evidence of aortic valve stenosis. Pulmonic Valve: The pulmonic valve was normal in structure. Pulmonic valve regurgitation is trivial. No evidence of pulmonic stenosis. Aorta: The aortic root is normal in size and structure. Venous: The inferior vena cava is normal in size with greater than 50% respiratory variability, suggesting right atrial pressure of 3 mmHg. IAS/Shunts: No atrial level shunt detected by color flow Doppler. Additional Comments: A device lead is visualized.  LEFT VENTRICLE PLAX 2D LVIDd:         4.20 cm   Diastology LVIDs:         3.30 cm   LV e' medial:    7.18 cm/s LV PW:         1.00 cm   LV E/e' medial:  6.4 LV IVS:        1.10 cm   LV e' lateral:   7.07 cm/s LVOT diam:     1.80 cm   LV E/e' lateral: 6.5 LV SV:         54 LV SV Index:   24 LVOT Area:     2.54 cm  RIGHT VENTRICLE RV Basal diam:  2.50 cm RV S prime:     7.40 cm/s TAPSE (M-mode): 1.4 cm RVSP:           8.0 mmHg LEFT ATRIUM             Index        RIGHT ATRIUM           Index LA diam:        3.40 cm 1.49 cm/m   RA Pressure: 3.00 mmHg LA Vol (A2C):   80.4 ml 35.16 ml/m  RA Area:     11.20 cm LA Vol (A4C):   47.2 ml 20.64 ml/m  RA Volume:   24.60 ml   10.76 ml/m LA Biplane Vol: 64.9 ml 28.38 ml/m  AORTIC VALVE LVOT Vmax:   96.40 cm/s LVOT Vmean:  62.900 cm/s LVOT VTI:    0.212 m  AORTA Ao Root diam: 3.50 cm Ao Asc diam:  3.30 cm MITRAL VALVE               TRICUSPID VALVE MV Area (PHT):             TR Peak grad:   5.0 mmHg MV Decel Time:             TR Vmax:        112.00 cm/s MV E velocity: 46.10 cm/s  Estimated RAP:  3.00 mmHg MV A velocity: 66.00 cm/s  RVSP:  8.0 mmHg MV E/A ratio:  0.70                            SHUNTS                            Systemic VTI:  0.21 m                            Systemic Diam: 1.80 cm Fransico Him MD Electronically signed by Fransico Him MD Signature Date/Time: 12/12/2022/4:51:23 PM    Final          Latest Ref Rng & Units 09/26/2021    2:58 PM 05/07/2021    3:12 PM 08/10/2020   10:03 AM 07/19/2019    3:20 PM 02/24/2017   11:58 AM  PFT Results  FVC-Pre L 2.38  2.53  2.57  2.74  2.58   FVC-Predicted Pre % 79  84  84  93  85   FVC-Post L    2.63  2.45   FVC-Predicted Post %    89  81   Pre FEV1/FVC % % 87  86  86  86  82   Post FEV1/FCV % %    90  87   FEV1-Pre L 2.06  2.17  2.22  2.36  2.12   FEV1-Predicted Pre % 92  97  97  107  93   FEV1-Post L    2.35  2.14   DLCO uncorrected ml/min/mmHg 9.76  15.50  15.83  16.52  16.15   DLCO UNC% % 47  74  76  81  59   DLCO corrected ml/min/mmHg 9.48  15.50  15.34   14.93   DLCO COR %Predicted % 45  74  73   55   DLVA Predicted % 77  105  97  104  79   TLC L   4.50  3.97    TLC % Predicted %   81  74    RV % Predicted %   71  52      No results found for: "NITRICOXIDE"      Assessment & Plan:   OSA on CPAP, compliant Excellent control and compliance on CPAP.  CPAP machine has gotten old and will need a replacement.  Will continue same settings.  Plan  Patient Instructions  Continue on CPAP with Oxygen  At bedtime   Order for new CPAP machine.  Work on healthy weight  Do not drive if sleepy  Follow up in  1 year and As needed   Please  contact office for sooner follow up if symptoms do not improve or worsen or seek emergency care      Chronic respiratory failure with hypoxia ([place]) Continue on oxygen at bedtime with her CPAP.     Rexene Edison, NP 01/03/2023

## 2023-01-03 NOTE — Telephone Encounter (Signed)
This encounter was created in error - please disregard.

## 2023-01-06 ENCOUNTER — Telehealth: Payer: Self-pay | Admitting: Family Medicine

## 2023-01-06 NOTE — Telephone Encounter (Signed)
Left message for patient to call back and schedule Medicare Annual Wellness Visit (AWV) either virtually or in office. Left  my [name] [name] number [phone]   Last AWV 09/03/19 please schedule with Nurse Health Adviser   45 min for awv-i  in office appointments 30 min for awv-s & awv-i phone/virtual appointments

## 2023-01-08 ENCOUNTER — Ambulatory Visit (INDEPENDENT_AMBULATORY_CARE_PROVIDER_SITE_OTHER): Payer: Medicare Other

## 2023-01-08 DIAGNOSIS — I495 Sick sinus syndrome: Secondary | ICD-10-CM | POA: Diagnosis not present

## 2023-01-09 ENCOUNTER — Encounter (HOSPITAL_COMMUNITY): Payer: Self-pay | Admitting: *Deleted

## 2023-01-09 LAB — CUP PACEART REMOTE DEVICE CHECK
Battery Remaining Longevity: 28 mo
Battery Voltage: 2.94 V
Brady Statistic AP VP Percent: 0.05 %
Brady Statistic AP VS Percent: 97 %
Brady Statistic AS VP Percent: 0 %
Brady Statistic AS VS Percent: 2.95 %
Brady Statistic RA Percent Paced: 97.01 %
Brady Statistic RV Percent Paced: 0.06 %
Date Time Interrogation Session: 20240208101258
Implantable Lead Connection Status: 753985
Implantable Lead Connection Status: 753985
Implantable Lead Implant Date: 20151103
Implantable Lead Implant Date: 20151103
Implantable Lead Location: 753859
Implantable Lead Location: 753860
Implantable Lead Model: 5076
Implantable Lead Model: 5076
Implantable Pulse Generator Implant Date: 20151103
Lead Channel Impedance Value: 456 Ohm
Lead Channel Impedance Value: 494 Ohm
Lead Channel Impedance Value: 570 Ohm
Lead Channel Impedance Value: 589 Ohm
Lead Channel Pacing Threshold Amplitude: 0.5 V
Lead Channel Pacing Threshold Amplitude: 0.5 V
Lead Channel Pacing Threshold Pulse Width: 0.4 ms
Lead Channel Pacing Threshold Pulse Width: 0.4 ms
Lead Channel Sensing Intrinsic Amplitude: 4.375 mV
Lead Channel Sensing Intrinsic Amplitude: 4.375 mV
Lead Channel Sensing Intrinsic Amplitude: 7.25 mV
Lead Channel Sensing Intrinsic Amplitude: 7.25 mV
Lead Channel Setting Pacing Amplitude: 1.5 V
Lead Channel Setting Pacing Amplitude: 2 V
Lead Channel Setting Pacing Pulse Width: 0.4 ms
Lead Channel Setting Sensing Sensitivity: 2.8 mV
Zone Setting Status: 755011
Zone Setting Status: 755011

## 2023-01-16 ENCOUNTER — Encounter (HOSPITAL_COMMUNITY): Payer: Self-pay | Admitting: Physician Assistant

## 2023-01-16 ENCOUNTER — Ambulatory Visit (HOSPITAL_COMMUNITY)
Admission: RE | Admit: 2023-01-16 | Discharge: 2023-01-16 | Disposition: A | Discharge status: Home / Self Care | Payer: Medicare Other | Source: Ambulatory Visit | Attending: Physician Assistant | Admitting: Physician Assistant

## 2023-01-16 VITALS — BP 124/60 | HR 65 | Ht 67.0 in | Wt 261.6 lb

## 2023-01-16 DIAGNOSIS — E669 Obesity, unspecified: Secondary | ICD-10-CM | POA: Insufficient documentation

## 2023-01-16 DIAGNOSIS — Z950 Presence of cardiac pacemaker: Secondary | ICD-10-CM | POA: Insufficient documentation

## 2023-01-16 DIAGNOSIS — F329 Major depressive disorder, single episode, unspecified: Secondary | ICD-10-CM

## 2023-01-16 DIAGNOSIS — I700 Atherosclerosis of aorta: Secondary | ICD-10-CM | POA: Insufficient documentation

## 2023-01-16 DIAGNOSIS — G4733 Obstructive sleep apnea (adult) (pediatric): Secondary | ICD-10-CM | POA: Insufficient documentation

## 2023-01-16 DIAGNOSIS — I480 Paroxysmal atrial fibrillation: Secondary | ICD-10-CM | POA: Insufficient documentation

## 2023-01-16 DIAGNOSIS — Z6841 Body Mass Index (BMI) 40.0 and over, adult: Secondary | ICD-10-CM | POA: Diagnosis not present

## 2023-01-16 DIAGNOSIS — J84112 Idiopathic pulmonary fibrosis: Secondary | ICD-10-CM | POA: Diagnosis not present

## 2023-01-16 DIAGNOSIS — D6869 Other thrombophilia: Secondary | ICD-10-CM | POA: Insufficient documentation

## 2023-01-16 DIAGNOSIS — I110 Hypertensive heart disease with heart failure: Secondary | ICD-10-CM | POA: Diagnosis not present

## 2023-01-16 DIAGNOSIS — I495 Sick sinus syndrome: Secondary | ICD-10-CM | POA: Insufficient documentation

## 2023-01-16 DIAGNOSIS — I5030 Unspecified diastolic (congestive) heart failure: Secondary | ICD-10-CM | POA: Diagnosis not present

## 2023-01-16 DIAGNOSIS — Z7901 Long term (current) use of anticoagulants: Secondary | ICD-10-CM | POA: Diagnosis not present

## 2023-01-16 DIAGNOSIS — F32A Depression, unspecified: Secondary | ICD-10-CM | POA: Diagnosis not present

## 2023-01-16 LAB — BASIC METABOLIC PANEL
Anion gap: 11 (ref 5–15)
BUN: 18 mg/dL (ref 8–23)
CO2: 24 mmol/L (ref 22–32)
Calcium: 9 mg/dL (ref 8.9–10.3)
Chloride: 100 mmol/L (ref 98–111)
Creatinine, Ser: 1.08 mg/dL — ABNORMAL HIGH (ref 0.44–1.00)
GFR, Estimated: 51 mL/min — ABNORMAL LOW (ref >60)
Glucose, Bld: 237 mg/dL — ABNORMAL HIGH (ref 70–99)
Potassium: 4.3 mmol/L (ref 3.5–5.1)
Sodium: 135 mmol/L (ref 135–145)

## 2023-01-16 LAB — MAGNESIUM: Magnesium: 2 mg/dL (ref 1.7–2.4)

## 2023-01-16 NOTE — Progress Notes (Signed)
Primary Care Physician: Eulas Post, MD Primary Cardiologist: Dr Sallyanne Kuster Primary Electrophysiologist: Dr Curt Bears Referring Physician: Dr Sherryle Lis is a 82 y.o. female with a history of tachybradycardia syndrome s/p PPM, diastolic CHF, MVP, OSA, idiopathic pulmonary fibrosis, CVA, aortic atherosclerosis, atrial fibrillation who presents for follow up in the [facility]. Patient is on Eliquis for a CHADS2VASC score of 8. Her afib burden was noted to be increasing on her device and she was referred to EP for evaluation. She was not felt to be a good ablation candidate and dofetilide was recommended. She is s/p dofetilide admission 4/4-03/08/22.  On follow up today, patient has done well from a cardiac standpoint. Her last remote device check showed no afib episodes. No bleeding issues on anticoagulation. She is chronically short of breath. She does express concern about how her health conditions are affecting her mood. She admits that she feels depressed and sleeps for several hours during the day. Denies any thoughts of self harm.   Today, she denies symptoms of palpitations, chest pain,  orthopnea, PND, lower extremity edema, dizziness, presyncope, syncope, bleeding, or neurologic sequela. The patient is tolerating medications without difficulties and is otherwise without complaint today.    Atrial Fibrillation Risk Factors:  she does have symptoms or diagnosis of sleep apnea. she is compliant with CPAP therapy. she does not have a history of rheumatic fever.   she has a BMI of Body mass index is 40.97 kg/m.Marland Kitchen Filed Weights   01/16/23 1433  Weight: 118.7 kg    Family History  Problem Relation Age of Onset   Heart attack Mother    Sudden death Mother    Stroke Father    Heart failure Father    Alcoholism Brother    Healthy Sister    Stroke Brother    Alpha-1 antitrypsin deficiency Daughter    Breast cancer Daughter    Prostate  cancer Brother    Stroke Brother    Prostate cancer Brother    Healthy Brother    Prostate cancer Brother    Healthy Daughter    Diabetes Son    Arthritis Son      Atrial Fibrillation Management history:  Previous antiarrhythmic drugs: dofetilide  Previous cardioversions: 2018 Previous ablations: none CHADS2VASC score: 8 Anticoagulation history: Eliquis   Past Medical History:  Diagnosis Date   Alpha-1-antitrypsin deficiency carrier    Arthritis    "all over"   Atherosclerosis of aorta ([place])    Atrial fibrillation ([place])    Back pain    Complication of anesthesia    "I woke up during 2 different procedures" (10/04/2014)   Depression    Diastolic dysfunction    Eating disorder    Frequent UTI    GERD (gastroesophageal reflux disease)    Heart disease    Hypertension    Hypertriglyceridemia    Insulin resistance    Long term current use of anticoagulant    Multiple lung nodules    Muscular deconditioning    MVP (mitral valve prolapse)    Obesity    OSA on CPAP    Osteoarthritis    Osteopenia    unspecified location   Pacemaker    PAF (paroxysmal atrial fibrillation) (HCC)    Positive ANA (antinuclear antibody)    Presbyesophagus    Presence of permanent cardiac pacemaker    Primary osteoarthritis of both hands    neck and spine and hips   Pulmonary  fibrosis ([place])    followed by pulmonolgy   PVC's (premature ventricular contractions)    Shortness of breath    Sinus arrest 10/2014   s/p Medtronic Advisa model A2DR01 serial number [id number]   Sleep apnea    Stroke ([place]) 01/2014   denies deficits on 10/04/2014   Tachy-brady syndrome ([place]) 10/2014   TIA (transient ischemic attack)    Vitamin D deficiency    Past Surgical History:  Procedure Laterality Date   CARDIOVERSION N/A 09/02/2017   Procedure: CARDIOVERSION;  Surgeon: Sanda Klein, MD;  Location: [facility];  Service: Cardiovascular;  Laterality: N/A;   COLONOSCOPY  2019   ESOPHAGEAL DILATION      EXCISION VAGINAL CYST     benign nodules   INSERT / REPLACE / REMOVE PACEMAKER  10/04/2014   Medtronic Advisa model A2DR01 serial number [id number]   LOOP RECORDER EXPLANT N/A 10/04/2014   Procedure: LOOP RECORDER EXPLANT;  Surgeon: Sanda Klein, MD;  Location: [facility];  Service: Cardiovascular;  Laterality: N/A;   LOOP RECORDER IMPLANT N/A 04/19/2014   Procedure: LOOP RECORDER IMPLANT;  Surgeon: Sanda Klein, MD;  Location: [facility];  Service: Cardiovascular;  Laterality: N/A;   NM MYOCAR PERF WALL MOTION  12/18/2007   normal   PERMANENT PACEMAKER INSERTION N/A 10/04/2014   Procedure: PERMANENT PACEMAKER INSERTION;  Surgeon: Sanda Klein, MD;  Location: [facility];  Service: Cardiovascular;  Laterality: N/A;   TUBAL LIGATION     US ECHOCARDIOGRAPHY  05/15/2010   LA mildly dilated,mild mitral annular ca+, AOV mildly sclerotic, mild asymmetric LVH    Current Outpatient Medications  Medication Sig Dispense Refill   apixaban (ELIQUIS) 5 MG TABS tablet Take 1 tablet (5 mg total) by mouth 2 (two) times daily. 180 tablet 1   benzonatate (TESSALON) 100 MG capsule Take 1 capsule (100 mg total) by mouth every 6 (six) hours as needed for cough. 30 capsule 1   Cholecalciferol (VITAMIN D3 PO) Take 1 tablet by mouth daily.     diazepam (VALIUM) 5 MG tablet TAKE 1 TABLET ONCE A DAY AS NEEDED. 30 tablet 0   dofetilide (TIKOSYN) 500 MCG capsule TAKE 1 CAPSULE (500 MCG TOTAL) BY MOUTH 2 (TWO) TIMES DAILY. 180 capsule 1   ipratropium (ATROVENT) 0.06 % nasal spray Place 2 sprays into both nostrils 4 (four) times daily as needed for rhinitis. 15 mL 12   Magnesium 400 MG CAPS Take 400 mg by mouth daily. 30 capsule 0   metoprolol tartrate (LOPRESSOR) 25 MG tablet Take 0.5 tablets (12.5 mg total) by mouth 2 (two) times daily. 90 tablet 3   nitroGLYCERIN (NITROSTAT) 0.4 MG SL tablet Place 1 tablet (0.4 mg total) under the tongue every 5 (five) minutes as needed for chest pain. 25 tablet 1    omeprazole (PRILOSEC) 40 MG capsule Take 1 capsule by mouth daily.     sertraline (ZOLOFT) 100 MG tablet TAKE ONE TABLET BY MOUTH ONCE DAILY. 90 tablet 3   No current facility-administered medications for this encounter.    Allergies  Allergen Reactions   Cefazolin Hives and Itching    Other reaction(s): severe hives   Pseudoephedrine Hcl     Other reaction(s): palpitations (moderate)   Ergotamine     unknown   Other Other (See Comments)   Pentobarbital     unknown   Pirfenidone     unknown   Caffeine Palpitations    Social History   Socioeconomic History   Marital status: Divorced  Spouse name: Not on file   Number of children: 3   Years of education: college   Highest education level: Not on file  Occupational History   Occupation: Retired Engineer, site  Tobacco Use   Smoking status: Former    Packs/day: 2.00    Years: 18.00    Total pack years: 36.00    Types: Cigarettes    Quit date: 07/18/1970    Years since quitting: 52.5   Smokeless tobacco: Never   Tobacco comments:    Former smoker 07/18/22 quit smoking in [place] Use   Vaping Use: Never used  Substance and Sexual Activity   Alcohol use: Yes    Alcohol/week: 0.0 standard drinks of alcohol    Comment: 10/04/2014 "might have a drink a couple times/yr"   Drug use: No   Sexual activity: Never  Other Topics Concern   Not on file  Social History Narrative   Lives at the [facility]    Patient is right handed.   Patient has a college degree.   Patient is retired.      Social History      Diet? Awful sweets are a terrible addiction       Do you drink/eat things with caffeine? some      Marital status?          divorced                          What year were you married? 1960      Do you live in a house, apartment, assisted living, condo, trailer, etc.? [place]       Is it one or more stories? 1st level      How many persons live in your home? 1      Do you have any pets in  your home? (please list) no       Highest level of education completed? College graduate      Current or past profession: realtor       Do you exercise?             Not much                          Type & how often? Barely any  - bed exercise for injured knee      Advanced Directives      Do you have a living will? yes      Do you have a DNR form?                                  If not, do you want to discuss one?yes      Do you have signed POA/HPOA for forms? yes      Functional Status      Do you have difficulty bathing or dressing yourself? No       Do you have difficulty preparing food or eating? No       Do you have difficulty managing your medications? No       Do you have difficulty managing your finances? No       Do you have difficulty affording your medications? No  Except eloquist after donut hole - so far I have been able to get samples from cardiologist      Social Determinants  of Health   Financial Resource Strain: Not on file  Food Insecurity: Not on file  Transportation Needs: Not on file  Physical Activity: Not on file  Stress: Not on file  Social Connections: Not on file  Intimate Partner Violence: Not on file    ROS- All systems are reviewed and negative except as per the HPI above.  Physical Exam: Vitals:   01/16/23 1433  BP: 124/60  Pulse: 65  Weight: 118.7 kg  Height: 5' 7"$  (1.702 m)    GEN- The patient is a well appearing elderly obese female, alert and oriented x 3 today.   HEENT-head normocephalic, atraumatic, sclera clear, conjunctiva pink, hearing intact, trachea midline. Lungs- Clear to ausculation bilaterally, normal work of breathing Heart- Regular rate and rhythm, no murmurs, rubs or gallops  GI- soft, NT, ND, + BS Extremities- no clubbing, cyanosis, or edema MS- no significant deformity or atrophy Skin- no rash or lesion Psych- euthymic mood, full affect Neuro- strength and sensation are intact   Wt Readings from Last 3  Encounters:  01/16/23 118.7 kg  01/03/23 118.2 kg  12/31/22 117.9 kg    EKG today demonstrates  A paced rhythm Vent. rate 65 BPM PR interval 220 ms QRS duration 70 ms QT/QTcB 414/430 ms  Echo  demonstrated   1. Left ventricular ejection fraction, by estimation, is 60 to 65%. The  left ventricle has normal function. The left ventricle has no regional  wall motion abnormalities. Left ventricular diastolic parameters are  consistent with Grade I diastolic dysfunction (impaired relaxation).   2. Right ventricular systolic function is normal. The right ventricular  size is normal. There is normal pulmonary artery systolic pressure. The  estimated right ventricular systolic pressure is 8.0 mmHg.   3. The mitral valve is degenerative. Mild mitral valve regurgitation. No  evidence of mitral stenosis.   4. The aortic valve is tricuspid. Aortic valve regurgitation is not  visualized. Aortic valve sclerosis is present, with no evidence of aortic  valve stenosis.   5. The inferior vena cava is normal in size with greater than 50%  respiratory variability, suggesting right atrial pressure of 3 mmHg.    Epic records are reviewed at length today  CHA2DS2-VASc Score = 8  The patient's score is based upon: CHF History: 1 HTN History: 1 Diabetes History: 0 Stroke History: 2 Vascular Disease History: 1 Age Score: 2 Gender Score: 1       ASSESSMENT AND PLAN: 1. Paroxysmal Atrial Fibrillation (ICD10:  I48.0) The patient's CHA2DS2-VASc score is 8, indicating a 10.8% annual risk of stroke.   S/p dofetilide admission 03/2022 Patient maintaining SR.  Continue Eliquis 5 mg BID  Continue dofetilide 500 mcg BID. QT stable. Check bmet/mag today. Continue Lopressor 25 mg BID  2. Secondary Hypercoagulable State (ICD10:  D68.69) The patient is at significant risk for stroke/thromboembolism based upon her CHA2DS2-VASc Score of 8.  Continue Apixaban (Eliquis).   3. Obesity Body mass index is  40.97 kg/m. Lifestyle modification was discussed and encouraged including regular physical activity and weight reduction.  4. Obstructive sleep apnea Encouraged compliance with CPAP therapy.  5. SSS/tachybradycardia syndrome S/p PPM, followed by Dr Sallyanne Kuster and the device clinic.  6. IPF Followed by Dr Chase Caller Avoiding amiodarone.  7. Depressed mood Patient admits to feeling depressed and sleeping most of the day. She would like some guidance about navigating her feelings about her health. Will refer to Dr Michail Sermon.    Follow up in the AF clinic in  6 months.    [facility] [address] [place], [place] [address] [phone]

## 2023-01-21 ENCOUNTER — Telehealth: Payer: Self-pay | Admitting: Cardiovascular Disease

## 2023-01-21 NOTE — Telephone Encounter (Signed)
Will forward to pharmacist

## 2023-01-21 NOTE — Telephone Encounter (Signed)
Dr. Gordy Levan called to ask if pt is able to have Bupivacaine for dental extraction. Please advise.

## 2023-01-22 ENCOUNTER — Other Ambulatory Visit: Payer: Self-pay | Admitting: Family Medicine

## 2023-01-22 ENCOUNTER — Ambulatory Visit (INDEPENDENT_AMBULATORY_CARE_PROVIDER_SITE_OTHER): Payer: Medicare Other

## 2023-01-22 VITALS — Ht 67.0 in | Wt 260.0 lb

## 2023-01-22 DIAGNOSIS — Z0000 Encounter for general adult medical examination without abnormal findings: Secondary | ICD-10-CM

## 2023-01-22 DIAGNOSIS — E785 Hyperlipidemia, unspecified: Secondary | ICD-10-CM

## 2023-01-22 NOTE — Telephone Encounter (Signed)
Patient doesn't have any allergies listed to this, but pharmacy cannot give clearance for type of anesthesia.  Will need to go through MD

## 2023-01-22 NOTE — Patient Instructions (Addendum)
[name] [name] , Thank you for taking time to come for your Medicare Wellness Visit. I appreciate your ongoing commitment to your health goals. Please review the following plan we discussed and let me know if I can assist you in the future.   These are the goals we discussed:  Goals       Patient Stated (pt-stated)      Lose weight by increasing activity.       Weight (lb) < 240 lb (108.9 kg)      Would like to lose weight by increasing activity and making healthier food choices.         This is a list of the screening recommended for you and due dates:  Health Maintenance  Topic Date Due   DTaP/Tdap/Td vaccine (3 - Td or Tdap) 04/07/2019   COVID-19 Vaccine (3 - Moderna risk series) 02/07/2023*   Medicare Annual Wellness Visit  01/23/2024   Pneumonia Vaccine  Completed   Flu Shot  Completed   DEXA scan (bone density measurement)  Completed   Zoster (Shingles) Vaccine  Completed   HPV Vaccine  Aged Out   Mammogram  Discontinued  *Topic was postponed. The date shown is not the original due date.    Advanced directives: influenza In Chart  Conditions/risks identified: None  Next appointment: Follow up in one year for your annual wellness visit     Preventive Care 65 Years and Older, Female Preventive care refers to lifestyle choices and visits with your health care provider that can promote health and wellness. What does preventive care include? A yearly physical exam. This is also called an annual well check. Dental exams once or twice a year. Routine eye exams. Ask your health care provider how often you should have your eyes checked. Personal lifestyle choices, including: Daily care of your teeth and gums. Regular physical activity. Eating a healthy diet. Avoiding tobacco and drug use. Limiting alcohol use. Practicing safe sex. Taking low-dose aspirin every day. Taking vitamin and mineral supplements as recommended by your health care provider. What happens during an  annual well check? The services and screenings done by your health care provider during your annual well check will depend on your age, overall health, lifestyle risk factors, and family history of disease. Counseling  Your health care provider may ask you questions about your: Alcohol use. Tobacco use. Drug use. Emotional well-being. Home and relationship well-being. Sexual activity. Eating habits. History of falls. Memory and ability to understand (cognition). Work and work Statistician. Reproductive health. Screening  You may have the following tests or measurements: Height, weight, and BMI. Blood pressure. Lipid and cholesterol levels. These may be checked every 5 years, or more frequently if you are over 26 years old. Skin check. Lung cancer screening. You may have this screening every year starting at age 55 if you have a 30-pack-year history of smoking and currently smoke or have quit within the past 15 years. Fecal occult blood test (FOBT) of the stool. You may have this test every year starting at age 19. Flexible sigmoidoscopy or colonoscopy. You may have a sigmoidoscopy every 5 years or a colonoscopy every 10 years starting at age 53. Hepatitis C blood test. Hepatitis B blood test. Sexually transmitted disease (STD) testing. Diabetes screening. This is done by checking your blood sugar (glucose) after you have not eaten for a while (fasting). You may have this done every 1-3 years. Bone density scan. This is done to screen for osteoporosis. You may  have this done starting at age 53. Mammogram. This may be done every 1-2 years. Talk to your health care provider about how often you should have regular mammograms. Talk with your health care provider about your test results, treatment options, and if necessary, the need for more tests. Vaccines  Your health care provider may recommend certain vaccines, such as: Influenza vaccine. This is recommended every year. Tetanus,  diphtheria, and acellular pertussis (Tdap, Td) vaccine. You may need a Td booster every 10 years. Zoster vaccine. You may need this after age 46. Pneumococcal 13-valent conjugate (PCV13) vaccine. One dose is recommended after age 88. Pneumococcal polysaccharide (PPSV23) vaccine. One dose is recommended after age 10. Talk to your health care provider about which screenings and vaccines you need and how often you need them. This information is not intended to replace advice given to you by your health care provider. Make sure you discuss any questions you have with your health care provider. Document Released: 12/15/2015 Document Revised: 08/07/2016 Document Reviewed: 09/19/2015 Elsevier Interactive Patient Education  2017 [place] Prevention in the Home Falls can cause injuries. They can happen to people of all ages. There are many things you can do to make your home safe and to help prevent falls. What can I do on the outside of my home? Regularly fix the edges of walkways and driveways and fix any cracks. Remove anything that might make you trip as you walk through a door, such as a raised step or threshold. Trim any bushes or trees on the path to your home. Use bright outdoor lighting. Clear any walking paths of anything that might make someone trip, such as rocks or tools. Regularly check to see if handrails are loose or broken. Make sure that both sides of any steps have handrails. Any raised decks and porches should have guardrails on the edges. Have any leaves, snow, or ice cleared regularly. Use sand or salt on walking paths during winter. Clean up any spills in your garage right away. This includes oil or grease spills. What can I do in the bathroom? Use night lights. Install grab bars by the toilet and in the tub and shower. Do not use towel bars as grab bars. Use non-skid mats or decals in the tub or shower. If you need to sit down in the shower, use a plastic,  non-slip stool. Keep the floor dry. Clean up any water that spills on the floor as soon as it happens. Remove soap buildup in the tub or shower regularly. Attach bath mats securely with double-sided non-slip rug tape. Do not have throw rugs and other things on the floor that can make you trip. What can I do in the bedroom? Use night lights. Make sure that you have a light by your bed that is easy to reach. Do not use any sheets or blankets that are too big for your bed. They should not hang down onto the floor. Have a firm chair that has side arms. You can use this for support while you get dressed. Do not have throw rugs and other things on the floor that can make you trip. What can I do in the kitchen? Clean up any spills right away. Avoid walking on wet floors. Keep items that you use a lot in easy-to-reach places. If you need to reach something above you, use a strong step stool that has a grab bar. Keep electrical cords out of the way. Do not use floor polish  or wax that makes floors slippery. If you must use wax, use non-skid floor wax. Do not have throw rugs and other things on the floor that can make you trip. What can I do with my stairs? Do not leave any items on the stairs. Make sure that there are handrails on both sides of the stairs and use them. Fix handrails that are broken or loose. Make sure that handrails are as long as the stairways. Check any carpeting to make sure that it is firmly attached to the stairs. Fix any carpet that is loose or worn. Avoid having throw rugs at the top or bottom of the stairs. If you do have throw rugs, attach them to the floor with carpet tape. Make sure that you have a light switch at the top of the stairs and the bottom of the stairs. If you do not have them, ask someone to add them for you. What else can I do to help prevent falls? Wear shoes that: Do not have high heels. Have rubber bottoms. Are comfortable and fit you well. Are closed  at the toe. Do not wear sandals. If you use a stepladder: Make sure that it is fully opened. Do not climb a closed stepladder. Make sure that both sides of the stepladder are locked into place. Ask someone to hold it for you, if possible. Clearly mark and make sure that you can see: Any grab bars or handrails. First and last steps. Where the edge of each step is. Use tools that help you move around (mobility aids) if they are needed. These include: Canes. Walkers. Scooters. Crutches. Turn on the lights when you go into a dark area. Replace any light bulbs as soon as they burn out. Set up your furniture so you have a clear path. Avoid moving your furniture around. If any of your floors are uneven, fix them. If there are any pets around you, be aware of where they are. Review your medicines with your doctor. Some medicines can make you feel dizzy. This can increase your chance of falling. Ask your doctor what other things that you can do to help prevent falls. This information is not intended to replace advice given to you by your health care provider. Make sure you discuss any questions you have with your health care provider. Document Released: 09/14/2009 Document Revised: 04/25/2016 Document Reviewed: 12/23/2014 Elsevier Interactive Patient Education  2017 [employer].

## 2023-01-22 NOTE — Progress Notes (Signed)
33  Subjective:   [name] [name] is a 82 y.o. female who presents for Medicare Annual (Subsequent) preventive examination.  Review of Systems    Virtual Visit via Telephone Note  I connected with  [name] on 01/22/23 at  9:45 AM EST by telephone and verified that I am speaking with the correct person using two identifiers.  Location: Patient: Home Provider: Office Persons participating in the virtual visit: patient/Nurse Health Advisor   I discussed the limitations, risks, security and privacy concerns of performing an evaluation and management service by telephone and the availability of in person appointments. The patient expressed understanding and agreed to proceed.  Interactive audio and video telecommunications were attempted between this nurse and patient, however failed, due to patient having technical difficulties OR patient did not have access to video capability.  We continued and completed visit with audio only.  Some vital signs may be absent or patient reported.   [name], LPN  Cardiac Risk Factors include: advanced age (>16mn, >>5women)     Objective:    Today's Vitals   01/22/23 1017  Weight: 260 lb (117.9 kg)  Height: 5' 7"$  (1.702 m)   Body mass index is 40.72 kg/m.     01/22/2023   10:27 AM 03/05/2022    3:57 PM 12/15/2021   11:15 AM 09/23/2019   11:52 AM 01/21/2018    1:43 PM 09/02/2017    1:12 PM 06/09/2017    1:06 PM  Advanced Directives  Does Patient Have a Medical Advance Directive? Yes No No Yes Yes Yes Yes  Type of [place]of [place]   Living will;Healthcare Power of [place] will [place] CityLiving will  Does patient want to make changes to medical advance directive? No - Patient declined   No - Patient declined     Copy of [place] Chart? Yes - validated most recent copy scanned  in chart (See row information)   Yes - validated most recent copy scanned in chart (See row information) No - copy requested No - copy requested No - copy requested  Would patient like information on creating a medical advance directive?  No - Patient declined         Current Medications (verified) Outpatient Encounter Medications as of 01/22/2023  Medication Sig   apixaban (ELIQUIS) 5 MG TABS tablet Take 1 tablet (5 mg total) by mouth 2 (two) times daily.   benzonatate (TESSALON) 100 MG capsule Take 1 capsule (100 mg total) by mouth every 6 (six) hours as needed for cough.   Cholecalciferol (VITAMIN D3 PO) Take 1 tablet by mouth daily.   diazepam (VALIUM) 5 MG tablet TAKE 1 TABLET ONCE A DAY AS NEEDED.   dofetilide (TIKOSYN) 500 MCG capsule TAKE 1 CAPSULE (500 MCG TOTAL) BY MOUTH 2 (TWO) TIMES DAILY.   ipratropium (ATROVENT) 0.06 % nasal spray Place 2 sprays into both nostrils 4 (four) times daily as needed for rhinitis.   Magnesium 400 MG CAPS Take 400 mg by mouth daily.   metoprolol tartrate (LOPRESSOR) 25 MG tablet Take 0.5 tablets (12.5 mg total) by mouth 2 (two) times daily.   nitroGLYCERIN (NITROSTAT) 0.4 MG SL tablet Place 1 tablet (0.4 mg total) under the tongue every 5 (five) minutes as needed for chest pain.   omeprazole (PRILOSEC) 40 MG capsule Take 1 capsule by mouth daily.   sertraline (ZOLOFT) 100  MG tablet TAKE ONE TABLET BY MOUTH ONCE DAILY.   No facility-administered encounter medications on file as of 01/22/2023.    Allergies (verified) Cefazolin, Pseudoephedrine hcl, Ergotamine, Other, Pentobarbital, Pirfenidone, and Caffeine   History: Past Medical History:  Diagnosis Date   Alpha-1-antitrypsin deficiency carrier    Arthritis    "all over"   Atherosclerosis of aorta (HCC)    Atrial fibrillation ([place])    Back pain    Complication of anesthesia    "I woke up during 2 different procedures" (10/04/2014)   Depression    Diastolic dysfunction    Eating disorder     Frequent UTI    GERD (gastroesophageal reflux disease)    Heart disease    Hypertension    Hypertriglyceridemia    Insulin resistance    Long term current use of anticoagulant    Multiple lung nodules    Muscular deconditioning    MVP (mitral valve prolapse)    Obesity    OSA on CPAP    Osteoarthritis    Osteopenia    unspecified location   Pacemaker    PAF (paroxysmal atrial fibrillation) (HCC)    Positive ANA (antinuclear antibody)    Presbyesophagus    Presence of permanent cardiac pacemaker    Primary osteoarthritis of both hands    neck and spine and hips   Pulmonary fibrosis ([place])    followed by pulmonolgy   PVC's (premature ventricular contractions)    Shortness of breath    Sinus arrest 10/2014   s/p Medtronic Advisa model A2DR01 serial number [id number]   Sleep apnea    Stroke ([place]) 01/2014   denies deficits on 10/04/2014   Tachy-brady syndrome ([place]) 10/2014   TIA (transient ischemic attack)    Vitamin D deficiency    Past Surgical History:  Procedure Laterality Date   CARDIOVERSION N/A 09/02/2017   Procedure: CARDIOVERSION;  Surgeon: Sanda Klein, MD;  Location: [facility];  Service: Cardiovascular;  Laterality: N/A;   COLONOSCOPY  2019   ESOPHAGEAL DILATION     EXCISION VAGINAL CYST     benign nodules   INSERT / REPLACE / REMOVE PACEMAKER  10/04/2014   Medtronic Advisa model A2DR01 serial number [id number]   LOOP RECORDER EXPLANT N/A 10/04/2014   Procedure: LOOP RECORDER EXPLANT;  Surgeon: Sanda Klein, MD;  Location: [facility] CATH LAB;  Service: Cardiovascular;  Laterality: N/A;   LOOP RECORDER IMPLANT N/A 04/19/2014   Procedure: LOOP RECORDER IMPLANT;  Surgeon: Sanda Klein, MD;  Location: [facility] CATH [facility];  Service: Cardiovascular;  Laterality: N/A;   NM MYOCAR PERF WALL MOTION  12/18/2007   normal   PERMANENT PACEMAKER INSERTION N/A 10/04/2014   Procedure: PERMANENT PACEMAKER INSERTION;  Surgeon: Sanda Klein, MD;  Location: [facility] CATH [facility];  Service:  Cardiovascular;  Laterality: N/A;   TUBAL LIGATION     US ECHOCARDIOGRAPHY  05/15/2010   LA mildly dilated,mild mitral annular ca+, AOV mildly sclerotic, mild asymmetric LVH   Family History  Problem Relation Age of Onset   Heart attack Mother    Sudden death Mother    Stroke Father    Heart failure Father    Alcoholism Brother    Healthy Sister    Stroke Brother    Alpha-1 antitrypsin deficiency Daughter    Breast cancer Daughter    Prostate cancer Brother    Stroke Brother    Prostate cancer Brother    Healthy Brother    Prostate cancer Brother    Healthy Daughter  Diabetes Son    Arthritis Son    Social History   Socioeconomic History   Marital status: Divorced    Spouse name: Not on file   Number of children: 3   Years of education: college   Highest education level: Not on file  Occupational History   Occupation: Retired Engineer, site  Tobacco Use   Smoking status: Former    Packs/day: 2.00    Years: 18.00    Total pack years: 36.00    Types: Cigarettes    Quit date: 07/18/1970    Years since quitting: 52.5   Smokeless tobacco: Never   Tobacco comments:    Former smoker 07/18/22 quit smoking in [place] Use   Vaping Use: Never used  Substance and Sexual Activity   Alcohol use: Yes    Alcohol/week: 0.0 standard drinks of alcohol    Comment: 10/04/2014 "might have a drink a couple times/yr"   Drug use: No   Sexual activity: Never  Other Topics Concern   Not on file  Social History Narrative   Lives at the [facility]    Patient is right handed.   Patient has a college degree.   Patient is retired.      Social History      Diet? Awful sweets are a terrible addiction       Do you drink/eat things with caffeine? some      Marital status?          divorced                          What year were you married? 1960      Do you live in a house, apartment, assisted living, condo, trailer, etc.? Friends [place]       Is it one or more stories?  1st level      How many persons live in your home? 1      Do you have any pets in your home? (please list) no       Highest level of education completed? College graduate      Current or past profession: realtor       Do you exercise?             Not much                          Type & how often? Barely any  - bed exercise for injured knee      Advanced Directives      Do you have a living will? yes      Do you have a DNR form?                                  If not, do you want to discuss one?yes      Do you have signed POA/HPOA for forms? yes      Functional Status      Do you have difficulty bathing or dressing yourself? No       Do you have difficulty preparing food or eating? No       Do you have difficulty managing your medications? No       Do you have difficulty managing your finances? No       Do you have difficulty affording your medications? No  Except eloquist after donut hole - so far I have been able to get samples from cardiologist      Social Determinants of Health   Financial Resource Strain: Low Risk  (01/22/2023)   Overall Financial Resource Strain (CARDIA)    Difficulty of Paying Living Expenses: Not hard at all  Food Insecurity: No Food Insecurity (01/22/2023)   Hunger Vital Sign    Worried About Running Out of Food in the Last Year: Never true    Ran Out of Food in the Last Year: Never true  Transportation Needs: No Transportation Needs (01/22/2023)   PRAPARE - Hydrologist (Medical): No    Lack of Transportation (Non-Medical): No  Physical Activity: Sufficiently Active (01/22/2023)   Exercise Vital Sign    Days of Exercise per Week: 4 days    Minutes of Exercise per Session: 40 min  Recent Concern: Physical Activity - Insufficiently Active (01/22/2023)   Exercise Vital Sign    Days of Exercise per Week: 3 days    Minutes of Exercise per Session: 40 min  Stress: No Stress Concern Present (01/22/2023)   [place]    Feeling of Stress : Not at all  Social Connections: Unknown (01/22/2023)   Social Connection and Isolation Panel [NHANES]    Frequency of Communication with Friends and Family: More than three times a week    Frequency of Social Gatherings with Friends and Family: More than three times a week    Attends Religious Services: More than 4 times per year    Active Member of [employer] or Organizations: Yes    Attends Archivist Meetings: More than 4 times per year    Marital Status: Patient refused    Tobacco Counseling Counseling given: Not Answered Tobacco comments: Former smoker 07/18/22 quit smoking in 1971   Clinical Intake:  Pre-visit preparation completed: No  Pain : No/denies pain     BMI - recorded: 40.72 Nutritional Risks: None Diabetes: No  How often do you need to have someone help you when you read instructions, pamphlets, or other written materials from your doctor or pharmacy?: 1 - Never  Diabetic?  No  Interpreter Needed?: No  Information entered by :: Rolene Arbour LPN   Activities of Daily Living    01/22/2023   10:25 AM 03/05/2022    3:57 PM  In your present state of health, do you have any difficulty performing the following activities:  Hearing? 0 0  Vision? 0 0  Difficulty concentrating or making decisions? 0 0  Walking or climbing stairs? 0 0  Dressing or bathing? 0 0  Doing errands, shopping? 0 0  Preparing Food and eating ? N   Using the Toilet? N   In the past six months, have you accidently leaked urine? Y   Comment Wears breifs, followed by PCP   Do you have problems with loss of bowel control? N   Managing your Medications? N   Managing your Finances? N   Housekeeping or managing your Housekeeping? N     Patient Care Team: Eulas Post, MD as PCP - General (Family Medicine) Croitoru, Dani Gobble, MD as PCP - Cardiology (Cardiology) Maisie Fus, MD  (Inactive) as Consulting Physician (Obstetrics and Gynecology) Richmond Campbell, MD as Consulting Physician (Gastroenterology) Croitoru, Dani Gobble, MD as Consulting Physician (Cardiology) Kathie Rhodes, MD (Inactive) as Consulting Physician (Urology) Danella Sensing, MD as Consulting Physician (Dermatology) Brand Males, MD  as Consulting Physician (Pulmonary Disease) Bo Merino, MD as Consulting Physician (Rheumatology) Barbaraann Cao, OD as Referring Physician (Optometry) Berenice Primas, MD as Referring Physician (Orthopedic Surgery)  Indicate any recent Medical Services you may have received from other than [facility] providers in the past year (date may be approximate).     Assessment:   This is a routine wellness examination for Staria.  Hearing/Vision screen Hearing Screening - Comments:: Denies hearing difficulties   Vision Screening - Comments:: Wears rx glasses - up to date with routine eye exams with  Dr Sabra Heck  Dietary issues and exercise activities discussed: Exercise limited by: None identified   Goals Addressed               This Visit's Progress     Patient Stated (pt-stated)        Lose weight by increasing activity.        Depression Screen    01/22/2023   10:22 AM 10/29/2022    7:55 AM 03/15/2020    3:28 PM 09/23/2019   11:52 AM 07/30/2019    1:16 PM 04/01/2019    9:35 AM 01/04/2019   12:04 PM  PHQ 2/9 Scores  PHQ - 2 Score 0 0 5 0 0 1 3  PHQ- 9 Score  0 16  0 4 11    Fall Risk    01/22/2023   10:25 AM 03/15/2020    3:28 PM 09/23/2019   11:52 AM 04/01/2019    9:35 AM 05/07/2018    3:47 PM  Fall Risk   Falls in the past year? 0 0 1 0 Yes  Number falls in past yr: 0 0 0 0 1  Injury with Fall? 0  1 0 Yes  Risk for fall due to : No Fall Risks  History of fall(s);Impaired mobility    Follow up Falls prevention discussed  Education provided;Falls prevention discussed;Falls evaluation completed      FALL RISK PREVENTION PERTAINING TO THE  HOME:  Any stairs in or around the home? Yes  If so, are there any without handrails? No  Home free of loose throw rugs in walkways, pet beds, electrical cords, etc? Yes  Adequate lighting in your home to reduce risk of falls? Yes   ASSISTIVE DEVICES UTILIZED TO PREVENT FALLS:  Life alert? Yes  Use of a cane, walker or w/c? Yes  Grab bars in the bathroom? Yes  Shower chair or bench in shower? Yes  Elevated toilet seat or a handicapped toilet? Yes   TIMED UP AND GO:  Was the test performed? No . Audio Visit   Cognitive Function:    01/21/2018    1:55 PM  MMSE - Mini Mental State Exam  Orientation to time 5  Orientation to Place 5  Registration 3  Attention/ Calculation 5  Recall 3  Language- name 2 objects 2  Language- repeat 1  Language- follow 3 step command 3  Language- read & follow direction 1  Write a sentence 1  Copy design 1  Total score 30        01/22/2023   10:27 AM 09/23/2019   11:53 AM  6CIT Screen  What Year? 0 points 0 points  What month? 0 points 0 points  What time? 0 points 0 points  Count back from 20 0 points 0 points  Months in reverse 0 points 0 points  Repeat phrase 0 points 0 points  Total Score 0 points 0 points    Immunizations Immunization  History  Administered Date(s) Administered   Fluad Quad(high Dose 65+) 08/14/2021, 09/03/2022   Influenza Split 10/09/2009, 09/19/2010, 09/13/2020   Influenza Whole 09/23/2008   Influenza, High Dose Seasonal PF 09/13/2018, 09/27/2019, 09/26/2020   Influenza,inj,quad, With Preservative 09/04/2017, 09/05/2021   Influenza-Unspecified 09/17/2014, 09/09/2016, 09/04/2017   Moderna Sars-Covid-2 Vaccination 12/06/2019, 01/03/2020   Pneumococcal Conjugate-13 09/29/2014, 10/16/2015   Pneumococcal Polysaccharide-23 04/06/2009, 10/02/2017   Pneumococcal-Unspecified 10/01/2014, 10/02/2017   Td 04/05/1999   Tdap 04/06/2009   Zoster Recombinat (Shingrix) 09/29/2018, 01/19/2019   Zoster, Live 12/02/2008,  09/29/2018, 01/19/2019    TDAP status: Due, Education has been provided regarding the importance of this vaccine. Advised may receive this vaccine at local pharmacy or Health Dept. Aware to provide a copy of the vaccination record if obtained from local pharmacy or Health Dept. Verbalized acceptance and understanding.  Flu Vaccine status: Up to date  Pneumococcal vaccine status: Up to date  Covid-19 vaccine status: Completed vaccines  Qualifies for Shingles Vaccine? Yes   Zostavax completed Yes   Shingrix Completed?: Yes  Screening Tests Health Maintenance  Topic Date Due   DTaP/Tdap/Td (3 - Td or Tdap) 04/07/2019   COVID-19 Vaccine (3 - Moderna risk series) 02/07/2023 (Originally 01/31/2020)   Medicare Annual Wellness (AWV)  01/23/2024   Pneumonia Vaccine 4+ Years old  Completed   INFLUENZA VACCINE  Completed   DEXA SCAN  Completed   Zoster Vaccines- Shingrix  Completed   HPV VACCINES  Aged Out   MAMMOGRAM  Discontinued    Health Maintenance  Health Maintenance Due  Topic Date Due   DTaP/Tdap/Td (3 - Td or Tdap) 04/07/2019    Colorectal cancer screening: No longer required.   Mammogram status: No longer required due to Age.  Bone Density status: Completed 06/01/16. Results reflect: Bone density results: OSTEOPOROSIS. Repeat every   years.  Lung Cancer Screening: (Low Dose CT Chest recommended if Age 48-80 years, 30 pack-year currently smoking OR have quit w/in 15years.) does not qualify.     Additional Screening:  Hepatitis C Screening: does not qualify; Completed   Vision Screening: Recommended annual ophthalmology exams for early detection of glaucoma and other disorders of the eye. Is the patient up to date with their annual eye exam?  Yes  Who is the provider or what is the name of the office in which the patient attends annual eye exams? Dr Sabra Heck If pt is not established with a provider, would they like to be referred to a provider to establish care? No .    Dental Screening: Recommended annual dental exams for proper oral hygiene  Community Resource Referral / Chronic Care Management:  CRR required this visit?  No   CCM required this visit?  No      Plan:     I have personally reviewed and noted the following in the patient's chart:   Medical and social history Use of alcohol, tobacco or illicit drugs  Current medications and supplements including opioid prescriptions. Patient is not currently taking opioid prescriptions. Functional ability and status Nutritional status Physical activity Advanced directives List of other physicians Hospitalizations, surgeries, and ER visits in previous 12 months Vitals Screenings to include cognitive, depression, and falls Referrals and appointments  In addition, I have reviewed and discussed with patient certain preventive protocols, quality metrics, and best practice recommendations. A written personalized care plan for preventive services as well as general preventive health recommendations were provided to patient.     [name], LPN   [id number]  Nurse Notes: None

## 2023-01-22 NOTE — Telephone Encounter (Signed)
Left message for dr gill that dr c returns on Monday. He is too call back if needs sooner answer.

## 2023-01-23 NOTE — Telephone Encounter (Signed)
Left detailed message for dr gill that anesthesia is fine.

## 2023-01-24 NOTE — Telephone Encounter (Signed)
Received fax from [facility]  Patient not eligible for assistance for the following reason: --documentaion of 3% out of pocket prescription expenses, based on household adjusted gross income, not met.    Letter was sent to patient to make aware.

## 2023-01-27 ENCOUNTER — Telehealth: Payer: Self-pay | Admitting: Adult Health

## 2023-01-27 NOTE — Telephone Encounter (Signed)
They have approved new Cpap for a 3 mo rental    Reference # [id number]  Amy From [facility] is the one calling. She has also notified [place], she said.

## 2023-01-28 ENCOUNTER — Telehealth: Payer: Self-pay | Admitting: Family Medicine

## 2023-01-28 DIAGNOSIS — M1990 Unspecified osteoarthritis, unspecified site: Secondary | ICD-10-CM

## 2023-01-28 NOTE — Telephone Encounter (Signed)
Requesting a prescription for a walker that allows patient to stand a little taller. Says it can be found at [facility] supply  [phone]. Says she will go to the store to get specifics of needed

## 2023-01-29 NOTE — Addendum Note (Signed)
Addended by: Nilda Riggs on: 01/29/2023 11:31 AM   Modules accepted: Orders

## 2023-01-29 NOTE — Telephone Encounter (Signed)
I spoke with the patient and she reported that she needs an adjustable walker for her arthritis. She states that she borrowed a friend walker and noticed this worked better for her than a regular walker. Order has been placed and faxed to [facility] medical supply

## 2023-01-29 NOTE — Telephone Encounter (Signed)
Left a message for the patient to return my call.  

## 2023-01-30 DIAGNOSIS — G4733 Obstructive sleep apnea (adult) (pediatric): Secondary | ICD-10-CM | POA: Diagnosis not present

## 2023-02-04 NOTE — Progress Notes (Signed)
Remote pacemaker transmission.   

## 2023-02-11 DIAGNOSIS — G4733 Obstructive sleep apnea (adult) (pediatric): Secondary | ICD-10-CM | POA: Diagnosis not present

## 2023-02-13 ENCOUNTER — Telehealth: Payer: Self-pay

## 2023-02-13 ENCOUNTER — Other Ambulatory Visit: Payer: Medicare Other

## 2023-02-13 NOTE — Progress Notes (Signed)
   02/13/2023  [name], female   DOB: 26-Feb-1941, 82 y.o.   MRN: [id number]  Patient outreach attempt x2 for scheduled telephone unsuccessful.  Left voicemail and sending MyChart message for patient to call to rescheduled at her convenience.    Darlina Guys, PharmD, DPLA

## 2023-02-13 NOTE — Progress Notes (Unsigned)
   02/13/2023  Patient ID: [name], female   DOB: 29-Aug-1941, 82 y.o.   MRN: [id number]  Outreach attempt x2 for scheduled telephone visit, but patient did not answer.  Left a voicemail and sending MyChart message for patient to reach out to rescheduled at her convenience.    Darlina Guys, PharmD, DPLA

## 2023-02-13 NOTE — Progress Notes (Signed)
02/13/2023 Name: [name] [name] MRN: [id number] DOB: 1941/05/02  Chief Complaint  Patient presents with   Medication Problem   [name] [name] is a 82 y.o. year old female who presented for a telephone visit.   They were referred to the pharmacist by their PCP for assistance in managing medication access.   Patient is participating in a Managed Medicaid Plan:  No  Subjective: Referral to pharmacy for medication assistance.  Patient is prescribed Eliquis, but the copay is expensive and causes her to go into the donut hole each year. Care Team: Primary Care Provider: Eulas Post, MD  Medication Access/Adherence -Patient reports affordability concerns with their medications: Yes  -Patient reports access/transportation concerns to their pharmacy: No  -Patient reports adherence concerns with their medications:  No    Medication Management: -Patient reports the following barriers to adherence: affordability of Eliquis -States she received patient assistance last year but received a letter that she was denied for this year -Denial letter states the reason was because patient did not provide proof of out of pocket expenses for medications  Objective: Medications Reviewed Today     Reviewed by Darlina Guys, [facility] (Pharmacist) on 02/13/23 at 1658  Med List Status: <None>   Medication Order Taking? Sig Documenting Provider Last Dose Status Informant  apixaban (ELIQUIS) 5 MG TABS tablet [id number] Yes Take 1 tablet (5 mg total) by mouth 2 (two) times daily. Croitoru, Mihai, MD Taking Active Self  benzonatate (TESSALON) 100 MG capsule [id number] Yes Take 1 capsule (100 mg total) by mouth every 6 (six) hours as needed for cough. Collene Gobble, MD Taking Active   Cholecalciferol (VITAMIN D3 PO) [id number] Yes Take 1 tablet by mouth daily. [provider] Taking Active   diazepam (VALIUM) 5 MG tablet [id number] Yes TAKE 1 TABLET ONCE A DAY AS NEEDED. Eulas Post, MD Taking  Active   dofetilide (TIKOSYN) 500 MCG capsule [id number] Yes TAKE 1 CAPSULE (500 MCG TOTAL) BY MOUTH 2 (TWO) TIMES DAILY. Sherran Needs, NP Taking Active   ipratropium (ATROVENT) 0.06 % nasal spray [id number] Yes Place 2 sprays into both nostrils 4 (four) times daily as needed for rhinitis. Collene Gobble, MD Taking Active   Magnesium 400 MG CAPS [id number] Yes Take 400 mg by mouth daily. Sherran Needs, NP Taking Active   metoprolol tartrate (LOPRESSOR) 25 MG tablet [id number] Yes Take 0.5 tablets (12.5 mg total) by mouth 2 (two) times daily. Croitoru, Mihai, MD Taking Active   nitroGLYCERIN (NITROSTAT) 0.4 MG SL tablet [id number] Yes Place 1 tablet (0.4 mg total) under the tongue every 5 (five) minutes as needed for chest pain. Croitoru, Dani Gobble, MD Taking Active Self           Med Note Mauri Reading Jun 10, 2022 11:06 AM) Patient has if needed  omeprazole (PRILOSEC) 40 MG capsule [id number] Yes Take 1 capsule by mouth daily. [provider] Taking Active Self  sertraline (ZOLOFT) 100 MG tablet [id number] Yes TAKE ONE TABLET BY MOUTH ONCE DAILY. Eulas Post, MD Taking Active            Assessment/Plan:   Medication Management: - Currently strategy insufficient to maintain appropriate adherence to prescribed medication regimen - Explained 3% income expenditure that is required for patient assistance approval with [employer].  Patient recalled that from previous year(s) and even has documentation from her pharmacy to send - She is going to call [employer] to verify documentation she  has from pharmacy is sufficient and will send in to complete application - I also informed her of the tier reduction request she could place with her insurance, but let her know that would put her in the donut hole sooner if done for Eliquis.  Recommended completion of PAP application for Eliquis, but suggested tier reduction request for dofetilide if needed.  Follow Up Plan: Patient has my direct number if  any future needs arise  Darlina Guys, PharmD, DPLA

## 2023-02-13 NOTE — Progress Notes (Signed)
   Care Guide Note  02/13/2023 Name: [name] [name] MRN: [id number] DOB: [birth year]  Referred by: Eulas Post, MD Reason for referral : Care Coordination (Outreach to schedule with Pharm d )   [name] [name] is a 82 y.o. year old female who is a primary care patient of Burchette, Alinda Sierras, MD. [name] [name] was referred to the pharmacist for assistance related to Atrial Fibrillation.    Successful contact was made with the patient to discuss pharmacy services including being ready for the pharmacist to call at least 5 minutes before the scheduled appointment time, to have medication bottles and any blood sugar or blood pressure readings ready for review. The patient agreed to meet with the pharmacist via with the pharmacist via telephone visit on (date/time).  02/13/2023  [name], [place], [place] [address] Direct Dial: [phone] [email or web address]

## 2023-02-14 DIAGNOSIS — H25042 Posterior subcapsular polar age-related cataract, left eye: Secondary | ICD-10-CM | POA: Diagnosis not present

## 2023-02-14 DIAGNOSIS — H2511 Age-related nuclear cataract, right eye: Secondary | ICD-10-CM | POA: Diagnosis not present

## 2023-02-14 DIAGNOSIS — H2512 Age-related nuclear cataract, left eye: Secondary | ICD-10-CM | POA: Diagnosis not present

## 2023-02-14 DIAGNOSIS — H25012 Cortical age-related cataract, left eye: Secondary | ICD-10-CM | POA: Diagnosis not present

## 2023-02-28 DIAGNOSIS — H2512 Age-related nuclear cataract, left eye: Secondary | ICD-10-CM | POA: Diagnosis not present

## 2023-03-01 DIAGNOSIS — G4733 Obstructive sleep apnea (adult) (pediatric): Secondary | ICD-10-CM | POA: Diagnosis not present

## 2023-03-09 ENCOUNTER — Other Ambulatory Visit: Payer: Self-pay | Admitting: Cardiovascular Disease

## 2023-03-09 DIAGNOSIS — I480 Paroxysmal atrial fibrillation: Secondary | ICD-10-CM

## 2023-03-10 NOTE — Telephone Encounter (Signed)
Prescription refill request for Eliquis received. Indication: Afib  Last office visit: 01/16/23 Charlean Merl) Scr: 1.08 (01/16/23)  Age: 82 Weight: 117.9kg  Appropriate dose. Refill sent.

## 2023-03-14 DIAGNOSIS — G4733 Obstructive sleep apnea (adult) (pediatric): Secondary | ICD-10-CM | POA: Diagnosis not present

## 2023-03-19 ENCOUNTER — Other Ambulatory Visit: Payer: Self-pay

## 2023-03-19 ENCOUNTER — Telehealth: Payer: Self-pay | Admitting: Internal Medicine

## 2023-03-19 DIAGNOSIS — G4733 Obstructive sleep apnea (adult) (pediatric): Secondary | ICD-10-CM

## 2023-03-19 NOTE — Telephone Encounter (Signed)
Called patient back this morning and she states she feels like she is having issues with her cpap . She states she has been having to sleep in recliner, also pt states she feels like she is not getting enough air. Her nasal passages feel stuffed up as well per patient.   She called [employer] and they said for [place] to look at the settings and to adjust.   Pulling report for you to look at and will sit on your desk.   Please advise

## 2023-03-19 NOTE — Telephone Encounter (Signed)
Spoke with patient advised pressure setting change has been sent to [employer] along with patient getting a new mask. She verbalized understanding. NFN

## 2023-03-19 NOTE — Telephone Encounter (Signed)
Change CPAP pressure to CPAP 10 cm of H2O. Mask is leaking.  Please make sure she has new mask or set up for mask fit testing. CPAP download in 2 weeks

## 2023-03-19 NOTE — Telephone Encounter (Signed)
Pt is returning call back,.

## 2023-03-19 NOTE — Telephone Encounter (Signed)
Called and left voicemail for patient to call office back  

## 2023-03-19 NOTE — Telephone Encounter (Signed)
Patient would like the nurse or doctor to call her regarding her cpap machine.  Please call to discuss further.  CB# [phone]

## 2023-03-20 ENCOUNTER — Telehealth: Payer: Self-pay | Admitting: Internal Medicine

## 2023-03-20 NOTE — Telephone Encounter (Signed)
Order has to be processed with DME. It can take a few days to get processed with the company. The order was just placed yesterday at 5pm. Nothing further needed

## 2023-03-20 NOTE — Telephone Encounter (Signed)
PT states the CPAP supplier does not have the order we sent yesterday.    Valinda Hoar # is [phone]  PT does want to speak to Nurse so please call @ [phone]

## 2023-03-31 ENCOUNTER — Telehealth: Payer: Self-pay | Admitting: Internal Medicine

## 2023-03-31 DIAGNOSIS — G4733 Obstructive sleep apnea (adult) (pediatric): Secondary | ICD-10-CM | POA: Diagnosis not present

## 2023-03-31 NOTE — Telephone Encounter (Signed)
Spoke with the pt and encouraged her to keep appt tomorrow and seek emergent care sooner if needed.

## 2023-03-31 NOTE — Telephone Encounter (Signed)
PT not well. Vacillating on if she should come in. Has been taking Musinex. Concerned it will only get worse based on her COPD and other issues.  I made a 15 min Acute appt. for tomorrow. If Dr. prefers to call in Antibx and pred please cancel that appt.

## 2023-04-01 ENCOUNTER — Encounter (HOSPITAL_COMMUNITY): Payer: Self-pay

## 2023-04-01 ENCOUNTER — Emergency Department (HOSPITAL_COMMUNITY)
Admission: EM | Admit: 2023-04-01 | Discharge: 2023-04-01 | Disposition: A | Discharge status: Home / Self Care | Payer: Medicare Other | Attending: Emergency Medicine | Admitting: Emergency Medicine

## 2023-04-01 ENCOUNTER — Emergency Department (HOSPITAL_COMMUNITY): Payer: Medicare Other

## 2023-04-01 ENCOUNTER — Other Ambulatory Visit: Payer: Self-pay

## 2023-04-01 ENCOUNTER — Ambulatory Visit: Payer: Medicare Other | Admitting: Internal Medicine

## 2023-04-01 DIAGNOSIS — M1611 Unilateral primary osteoarthritis, right hip: Secondary | ICD-10-CM | POA: Diagnosis not present

## 2023-04-01 DIAGNOSIS — Z7901 Long term (current) use of anticoagulants: Secondary | ICD-10-CM | POA: Insufficient documentation

## 2023-04-01 DIAGNOSIS — R059 Cough, unspecified: Secondary | ICD-10-CM | POA: Diagnosis not present

## 2023-04-01 DIAGNOSIS — Z79899 Other long term (current) drug therapy: Secondary | ICD-10-CM | POA: Diagnosis not present

## 2023-04-01 DIAGNOSIS — M160 Bilateral primary osteoarthritis of hip: Secondary | ICD-10-CM | POA: Diagnosis not present

## 2023-04-01 DIAGNOSIS — I4891 Unspecified atrial fibrillation: Secondary | ICD-10-CM | POA: Insufficient documentation

## 2023-04-01 DIAGNOSIS — M25551 Pain in right hip: Secondary | ICD-10-CM | POA: Insufficient documentation

## 2023-04-01 DIAGNOSIS — G4733 Obstructive sleep apnea (adult) (pediatric): Secondary | ICD-10-CM | POA: Diagnosis not present

## 2023-04-01 DIAGNOSIS — M25572 Pain in left ankle and joints of left foot: Secondary | ICD-10-CM | POA: Diagnosis not present

## 2023-04-01 MED ORDER — OXYCODONE HCL 5 MG PO TABS: 2.5000 mg | ORAL_TABLET | Freq: Four times a day (QID) | ORAL | 0 refills | Status: DC | PRN

## 2023-04-01 MED ORDER — HYDROCODONE-ACETAMINOPHEN 5-325 MG PO TABS
2.0000 | ORAL_TABLET | Freq: Once | ORAL | Status: AC
  Administered 2023-04-01: 2 via ORAL
  Filled 2023-04-01: qty 2

## 2023-04-01 MED ORDER — ONDANSETRON HCL 4 MG/2ML IJ SOLN: 4.0000 mg | Freq: Once | INTRAMUSCULAR | Status: DC

## 2023-04-01 MED ORDER — ONDANSETRON 4 MG PO TBDP
4.0000 mg | ORAL_TABLET | Freq: Once | ORAL | Status: DC
  Filled 2023-04-01: qty 1

## 2023-04-01 NOTE — Discharge Instructions (Signed)
Ordered you some prescription pain medication.  Please note that this can make you sleepy or sometimes confused.  There is also increased risk of falls when taking this medication.  It is very important that you use your walker every time you stand up and make sure that your pathway in your home is clear for walking especially since you are on Eliquis.  The other side effect is that it can make you constipated.  If this occurs please take stool softener such as docusate sodium and MiraLAX.  Follow-up with your primary care physician or the orthopedist.  Get help right away if you have any new or worsening conditions, inability to move or use your leg or new concerns.

## 2023-04-01 NOTE — ED Triage Notes (Addendum)
[facility]. Complains of right hip pain radiating to right knee, no trauma. Pain started yesterday. Alert and oriented. Patient thinks may be related to sleeping in her recliner the past 3 days because she has had a cough that is worse when she lays down

## 2023-04-01 NOTE — ED Provider Notes (Signed)
[place] EMERGENCY DEPARTMENT AT [facility] Provider Note   CSN: [id number] Arrival date & time: 04/01/23  1500     History  No chief complaint on file.   [name] [name] is a 82 y.o. female who presents to the emergency department chief complaint of right hip pain.  Patient states that she has had a problem with her right hip for "20 to 30 years."  She reports that she has had a bad cold and has been sleeping in her recliner due to her coughing being worse when she sleeps in her bed lying flat.  Due to this she feels like she slept funny on her right hip.  She comes planes of severe pain in the right hip joint which radiates to her knee.  She has taken Tylenol without relief of her pain and has been having to use her walker at home.  She is unable to take NSAIDs due to concurrent use of anticoagulation for A-fib.  Patient denies any recent falls or traumas to the hip.  HPI     Home Medications Prior to Admission medications   Medication Sig Start Date End Date Taking? Authorizing Provider  benzonatate (TESSALON) 100 MG capsule Take 1 capsule (100 mg total) by mouth every 6 (six) hours as needed for cough. 11/15/22   Leslye Peer, MD  Cholecalciferol (VITAMIN D3 PO) Take 1 tablet by mouth daily.    [provider]  diazepam (VALIUM) 5 MG tablet TAKE 1 TABLET ONCE A DAY AS NEEDED. 11/28/22   Burchette, Elberta Fortis, MD  dofetilide (TIKOSYN) 500 MCG capsule TAKE 1 CAPSULE (500 MCG TOTAL) BY MOUTH 2 (TWO) TIMES DAILY. 11/21/22   Newman Nip, NP  ELIQUIS 5 MG TABS tablet TAKE ONE TABLET BY MOUTH TWICE DAILY 03/10/23   Croitoru, Rachelle Hora, MD  ipratropium (ATROVENT) 0.06 % nasal spray Place 2 sprays into both nostrils 4 (four) times daily as needed for rhinitis. 11/15/22   Leslye Peer, MD  Magnesium 400 MG CAPS Take 400 mg by mouth daily. 03/15/22   Newman Nip, NP  metoprolol tartrate (LOPRESSOR) 25 MG tablet Take 0.5 tablets (12.5 mg total) by mouth 2 (two) times  daily. 01/02/23   Croitoru, Mihai, MD  nitroGLYCERIN (NITROSTAT) 0.4 MG SL tablet Place 1 tablet (0.4 mg total) under the tongue every 5 (five) minutes as needed for chest pain. 09/19/20   Croitoru, Mihai, MD  omeprazole (PRILOSEC) 40 MG capsule Take 1 capsule by mouth daily. 02/02/21   [provider]  sertraline (ZOLOFT) 100 MG tablet TAKE ONE TABLET BY MOUTH ONCE DAILY. 12/11/22   Burchette, Elberta Fortis, MD      Allergies    Cefazolin, Pseudoephedrine hcl, Ergotamine, Other, Pentobarbital, Pirfenidone, and Caffeine    Review of Systems   Review of Systems  Physical Exam Updated Vital Signs BP (!) 149/74 (BP Location: Left Arm)   Pulse 63   Temp (!) 97.4 F (36.3 C) (Oral)   Resp 14   LMP  (LMP Unknown)   SpO2 98%  Physical Exam Vitals and nursing note reviewed.  Constitutional:      General: She is not in acute distress.    Appearance: She is well-developed. She is not diaphoretic.  HENT:     Head: Normocephalic and atraumatic.     Right Ear: External ear normal.     Left Ear: External ear normal.     Nose: Nose normal.     Mouth/Throat:  Mouth: Mucous membranes are moist.  Eyes:     General: No scleral icterus.    Conjunctiva/sclera: Conjunctivae normal.  Cardiovascular:     Rate and Rhythm: Normal rate and regular rhythm.     Heart sounds: Normal heart sounds. No murmur heard.    No friction rub. No gallop.  Pulmonary:     Effort: Pulmonary effort is normal. No respiratory distress.     Breath sounds: Normal breath sounds.  Abdominal:     General: Bowel sounds are normal. There is no distension.     Palpations: Abdomen is soft. There is no mass.     Tenderness: There is no abdominal tenderness. There is no guarding.  Musculoskeletal:     Cervical back: Normal range of motion.     Comments: Decreased range of motion of the right hip, normal strength.  Pain with internal rotation of the hip, no pain with external rotation, normal ipsilateral knee and ankle  examination, and NVI.  Skin:    General: Skin is warm and dry.  Neurological:     Mental Status: She is alert and oriented to person, place, and time.  Psychiatric:        Behavior: Behavior normal.     ED Results / Procedures / Treatments   Labs (all labs ordered are listed, but only abnormal results are displayed) Labs Reviewed - No data to display  EKG None  Radiology No results found.  Procedures Procedures    Medications Ordered in ED Medications  HYDROcodone-acetaminophen (NORCO/VICODIN) 5-325 MG per tablet 2 tablet (has no administration in time range)  ondansetron (ZOFRAN) injection 4 mg (has no administration in time range)    ED Course/ Medical Decision Making/ A&P Clinical Course as of 04/02/23 1022  Tue Apr 01, 2023  1702 Stable 81 YOF with right hip pain XR with arthritic changes Pain control Follow up with ortho.  [CC]    Clinical Course User Index [CC] Glyn Ade, MD                             Medical Decision Making Amount and/or Complexity of Data Reviewed Radiology: ordered.  Risk Prescription drug management.    82 year old female who presents emergency department with atraumatic right hip pain.  She has a longstanding history of the same.  I suspect she has osteoarthritis pain however differential can include avascular necrosis which she has no significant risk factors, pathologic fracture, labral tear, doubt osteomyelitis.  I reviewed triage labs which showed no acute findings.  I ordered x-ray of the right hip which showed degenerative changes.  Patient given Norco in the emergency department today addressed pain with some improvement.  Patient does not have any evidence of radiculopathy and is able to ambulate with assistive devices.  She will need outpatient follow-up with her PCP or orthopedics.  No other abnormalities noted.  PDMP reviewed, low-dose oxycodone ordered.  Narcotic precautions given in discharge  paperwork        Final Clinical Impression(s) / ED Diagnoses Final diagnoses:  None    Rx / DC Orders ED Discharge Orders     None         Arthor Captain, PA-C 04/02/23 1025    Glyn Ade, MD 04/04/23 1553

## 2023-04-04 ENCOUNTER — Encounter: Payer: Self-pay | Admitting: Family Medicine

## 2023-04-04 ENCOUNTER — Ambulatory Visit (INDEPENDENT_AMBULATORY_CARE_PROVIDER_SITE_OTHER): Payer: Medicare Other | Admitting: Family Medicine

## 2023-04-04 VITALS — BP 126/60 | HR 83 | Temp 97.8°F | Ht 67.0 in | Wt 262.8 lb

## 2023-04-04 DIAGNOSIS — D229 Melanocytic nevi, unspecified: Secondary | ICD-10-CM

## 2023-04-04 DIAGNOSIS — F4321 Adjustment disorder with depressed mood: Secondary | ICD-10-CM | POA: Diagnosis not present

## 2023-04-04 DIAGNOSIS — M25551 Pain in right hip: Secondary | ICD-10-CM | POA: Diagnosis not present

## 2023-04-04 DIAGNOSIS — M160 Bilateral primary osteoarthritis of hip: Secondary | ICD-10-CM

## 2023-04-04 MED ORDER — OXYCODONE HCL 5 MG PO TABS: 2.5000 mg | ORAL_TABLET | Freq: Three times a day (TID) | ORAL | 0 refills | Status: DC | PRN

## 2023-04-04 NOTE — Progress Notes (Signed)
Established Patient Office Visit   Subjective  [name], female    DOB: 1940-12-12  Age: 82 y.o. 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 Caryl Never followed and seen for ED follow-up.  Patient seen in ED on 4/30 for acute on chronic right hip pain.  Pain became severe.  DG hip with OA, R>L hip.  Given oxycodone 2.5-5 mg every 6 as needed for severe pain.  Has 3 tabs left.  Requesting refill.  Denies constipation.  Patient states pain has improved some.  Hip was likely aggravated 2/2 pt falling asleep in recliner.  States was unable to get up from seated position while having lunch with friends.  2 people had to assist her.  Patient unable to take Voltaren gel or NSAIDs due to anticoagulation.  States Tylenol does not help.  In the past seen by Ortho, Dr. Despina Hick for knee injections.  Patient endorses feeling down at times.  Denies SI/HI.  Currently living at [facility].  States does not really have friends there.  Does not want to bother her friends as they have their own health problems.  Pt with a bump on left leg.  States she thought it would go away but is still there.  Area is not painful.    Patient Active Problem List   Diagnosis Date Noted   Viral URI 11/15/2022   Heart disease 10/29/2022   Hypercoagulable state due to paroxysmal atrial fibrillation (HCC) 03/05/2022   Paroxysmal A-fib (HCC) 03/05/2022   Rectal bleeding 09/18/2021   Chronic respiratory failure with hypoxia (HCC) 04/05/2021   Alpha-1-antitrypsin deficiency carrier 07/29/2020   Arthritis 12/12/2019   Muscular deconditioning 05/30/2019   Presbyesophagus 03/05/2019   ASCVD (arteriosclerotic cardiovascular disease) risk  score 18.9% 11/22/2018   Depression 10/15/2018   Seasonal allergies, using Flonase and Loratadine prn 10/15/2018   Atherosclerosis of aorta (HCC) 10/15/2018   Diastolic dysfunction 10/15/2018   Insulin resistance 11/05/2017   Vitamin D deficiency 09/17/2017   Class 3 severe obesity with serious comorbidity and body mass index (BMI) of 40.0 to 44.9 in adult [facility]) 09/17/2017   Hypertriglyceridemia 09/17/2017   Multiple lung nodules 05/23/2017   Pulmonary fibrosis (HCC), followed by Pulmonolgy 05/23/2017   Abnormal CT scan of lung 01/07/2017   GERD (gastroesophageal reflux disease) 12/12/2016   Dyspnea on exertion 12/10/2016   Anxiety state 07/15/2016   OSA on CPAP, compliant 05/04/2016   Pacemaker 10/04/2014   Long term current use of anticoagulant 06/27/2014   Paroxysmal atrial fibrillation (HCC) 06/06/2014   Tachy-brady syndrome (HCC) 06/06/2014   TIA (transient ischemic attack) 03/08/2014   Osteopenia 08/15/2008   Acute bronchitis 01/07/2008   Past Surgical History:  Procedure Laterality Date   CARDIOVERSION N/A 09/02/2017   Procedure: CARDIOVERSION;  Surgeon: Thurmon Fair, MD;  Location: [facility] ENDOSCOPY;  Service: Cardiovascular;  Laterality: N/A;   COLONOSCOPY  2019   ESOPHAGEAL DILATION     EXCISION VAGINAL CYST     benign nodules   INSERT / REPLACE / REMOVE PACEMAKER  10/04/2014   Medtronic Advisa model A2DR01 serial number [id number]   LOOP RECORDER EXPLANT N/A 10/04/2014   Procedure: LOOP RECORDER EXPLANT;  Surgeon: Thurmon Fair, MD;  Location: [facility] CATH LAB;  Service: Cardiovascular;  Laterality: N/A;   LOOP RECORDER IMPLANT N/A 04/19/2014   Procedure: LOOP RECORDER IMPLANT;  Surgeon: Thurmon Fair, MD;  Location: [facility] CATH LAB;  Service: Cardiovascular;  Laterality: N/A;   NM MYOCAR PERF WALL MOTION  12/18/2007   normal   PERMANENT PACEMAKER INSERTION N/A 10/04/2014   Procedure: PERMANENT PACEMAKER INSERTION;  Surgeon: Thurmon Fair, MD;  Location: [facility] CATH LAB;  Service:  Cardiovascular;  Laterality: N/A;   TUBAL LIGATION     US ECHOCARDIOGRAPHY  05/15/2010   LA mildly dilated,mild mitral annular ca+, AOV mildly sclerotic, mild asymmetric LVH   Social History   Tobacco Use   Smoking status: Former    Packs/day: 2.00    Years: 18.00    Additional pack years: 0.00    Total pack years: 36.00    Types: Cigarettes    Quit date: 07/18/1970    Years since quitting: 52.7   Smokeless tobacco: Never   Tobacco comments:    Former smoker 07/18/22 quit smoking in 1971  Vaping Use   Vaping Use: Never used  Substance Use Topics   Alcohol use: Yes    Alcohol/week: 0.0 standard drinks of alcohol    Comment: 10/04/2014 "might have a drink a couple times/yr"   Drug use: No   Family History  Problem Relation Age of Onset   Heart attack Mother    Sudden death Mother    Stroke Father    Heart failure Father    Alcoholism Brother    Healthy Sister    Stroke Brother    Alpha-1 antitrypsin deficiency Daughter    Breast cancer Daughter    Prostate cancer Brother    Stroke Brother    Prostate cancer Brother    Healthy Brother    Prostate cancer Brother    Healthy Daughter    Diabetes Son    Arthritis Son    Allergies  Allergen Reactions   Cefazolin Hives and Itching    Other reaction(s): severe hives   Pseudoephedrine Hcl     Other reaction(s): palpitations (moderate)   Ergotamine     unknown   Other Other (See Comments)   Pentobarbital     unknown   Pirfenidone     unknown   Caffeine Palpitations      ROS Negative unless stated above    Objective:     BP 126/60 (BP Location: Left Arm, Patient Position: Sitting, Cuff Size: Large)   Pulse 83   Temp 97.8 F (36.6 C) (Oral)   Ht 5\' 7"  (1.702 m)   Wt 262 lb 12.8 oz (119.2 kg)   LMP  (LMP Unknown)   SpO2 97%   BMI 41.16 kg/m    Physical Exam Constitutional:      General: She is not in acute distress.    Appearance: Normal appearance.  HENT:     Head: Normocephalic and atraumatic.      Nose: Nose normal.     Mouth/Throat:     Mouth: Mucous membranes are moist.  Cardiovascular:     Rate and Rhythm: Normal rate and regular rhythm.     Heart sounds: Normal heart sounds. No murmur heard.    No gallop.  Pulmonary:     Effort: Pulmonary effort is normal. No respiratory distress.     Breath sounds: Normal breath sounds. No wheezing, rhonchi or rales.  Skin:    General: Skin is warm and dry.          Comments: 6 mm flesh-colored papule on  left medial thigh with dried area in center.  No erythema, drainage, or tenderness.  Neurological:     Mental Status: She is alert and oriented to person, place, and time.     Comments: Ambulating with rollator.  Difficulty moving from sitting to standing position.  Slowed gait.      No results found for any visits on 04/04/23.    Assessment & Plan:  Primary osteoarthritis of both hips -     Ambulatory referral to Orthopedic Surgery -     oxyCODONE HCl; Take 0.5 tablets (2.5 mg total) by mouth every 8 (eight) hours as needed for severe pain.  Dispense: 10 tablet; Refill: 0  Right hip pain -     Ambulatory referral to Orthopedic Surgery -     oxyCODONE HCl; Take 0.5 tablets (2.5 mg total) by mouth every 8 (eight) hours as needed for severe pain.  Dispense: 10 tablet; Refill: 0  Fibrous papule of skin  Adjustment disorder with depressed mood  OA bilateral hips right greater than left.  Referral to Ortho.  Advised to use oxycodone sparingly.  Given limited refill until Ortho appointment.  MiraLAX/other bowel regimen advised.  Refer to dermatology advised for removal of papule.  Patient advised to consider counseling.  Given precautions.  Return if symptoms worsen or fail to improve.   [name] Saint, MD

## 2023-04-09 ENCOUNTER — Ambulatory Visit (INDEPENDENT_AMBULATORY_CARE_PROVIDER_SITE_OTHER): Payer: Medicare Other

## 2023-04-09 DIAGNOSIS — I495 Sick sinus syndrome: Secondary | ICD-10-CM | POA: Diagnosis not present

## 2023-04-11 LAB — CUP PACEART REMOTE DEVICE CHECK
Battery Remaining Longevity: 25 mo
Battery Voltage: 2.93 V
Brady Statistic AP VP Percent: 0.15 %
Brady Statistic AP VS Percent: 96.69 %
Brady Statistic AS VP Percent: 0 %
Brady Statistic AS VS Percent: 3.16 %
Brady Statistic RA Percent Paced: 96.77 %
Brady Statistic RV Percent Paced: 0.21 %
Date Time Interrogation Session: 20240510121527
Implantable Lead Connection Status: 753985
Implantable Lead Connection Status: 753985
Implantable Lead Implant Date: 20151103
Implantable Lead Implant Date: 20151103
Implantable Lead Location: 753859
Implantable Lead Location: 753860
Implantable Lead Model: 5076
Implantable Lead Model: 5076
Implantable Pulse Generator Implant Date: 20151103
Lead Channel Impedance Value: 418 Ohm
Lead Channel Impedance Value: 456 Ohm
Lead Channel Impedance Value: 494 Ohm
Lead Channel Impedance Value: 513 Ohm
Lead Channel Pacing Threshold Amplitude: 0.5 V
Lead Channel Pacing Threshold Amplitude: 0.625 V
Lead Channel Pacing Threshold Pulse Width: 0.4 ms
Lead Channel Pacing Threshold Pulse Width: 0.4 ms
Lead Channel Sensing Intrinsic Amplitude: 4 mV
Lead Channel Sensing Intrinsic Amplitude: 4 mV
Lead Channel Sensing Intrinsic Amplitude: 6.375 mV
Lead Channel Sensing Intrinsic Amplitude: 6.375 mV
Lead Channel Setting Pacing Amplitude: 1.5 V
Lead Channel Setting Pacing Amplitude: 2 V
Lead Channel Setting Pacing Pulse Width: 0.4 ms
Lead Channel Setting Sensing Sensitivity: 2.8 mV
Zone Setting Status: 755011
Zone Setting Status: 755011

## 2023-04-13 DIAGNOSIS — G4733 Obstructive sleep apnea (adult) (pediatric): Secondary | ICD-10-CM | POA: Diagnosis not present

## 2023-04-14 DIAGNOSIS — M545 Low back pain, unspecified: Secondary | ICD-10-CM | POA: Diagnosis not present

## 2023-04-14 DIAGNOSIS — R2681 Unsteadiness on feet: Secondary | ICD-10-CM | POA: Diagnosis not present

## 2023-04-14 DIAGNOSIS — M6281 Muscle weakness (generalized): Secondary | ICD-10-CM | POA: Diagnosis not present

## 2023-04-14 DIAGNOSIS — R278 Other lack of coordination: Secondary | ICD-10-CM | POA: Diagnosis not present

## 2023-04-16 DIAGNOSIS — M6281 Muscle weakness (generalized): Secondary | ICD-10-CM | POA: Diagnosis not present

## 2023-04-16 DIAGNOSIS — R2681 Unsteadiness on feet: Secondary | ICD-10-CM | POA: Diagnosis not present

## 2023-04-16 DIAGNOSIS — R278 Other lack of coordination: Secondary | ICD-10-CM | POA: Diagnosis not present

## 2023-04-16 DIAGNOSIS — M545 Low back pain, unspecified: Secondary | ICD-10-CM | POA: Diagnosis not present

## 2023-04-21 DIAGNOSIS — M6281 Muscle weakness (generalized): Secondary | ICD-10-CM | POA: Diagnosis not present

## 2023-04-21 DIAGNOSIS — R278 Other lack of coordination: Secondary | ICD-10-CM | POA: Diagnosis not present

## 2023-04-21 DIAGNOSIS — M545 Low back pain, unspecified: Secondary | ICD-10-CM | POA: Diagnosis not present

## 2023-04-21 DIAGNOSIS — R2681 Unsteadiness on feet: Secondary | ICD-10-CM | POA: Diagnosis not present

## 2023-04-23 DIAGNOSIS — R2681 Unsteadiness on feet: Secondary | ICD-10-CM | POA: Diagnosis not present

## 2023-04-23 DIAGNOSIS — M6281 Muscle weakness (generalized): Secondary | ICD-10-CM | POA: Diagnosis not present

## 2023-04-23 DIAGNOSIS — M545 Low back pain, unspecified: Secondary | ICD-10-CM | POA: Diagnosis not present

## 2023-04-23 DIAGNOSIS — R278 Other lack of coordination: Secondary | ICD-10-CM | POA: Diagnosis not present

## 2023-04-30 NOTE — Progress Notes (Signed)
Remote pacemaker transmission.   

## 2023-05-01 DIAGNOSIS — R278 Other lack of coordination: Secondary | ICD-10-CM | POA: Diagnosis not present

## 2023-05-01 DIAGNOSIS — M6281 Muscle weakness (generalized): Secondary | ICD-10-CM | POA: Diagnosis not present

## 2023-05-01 DIAGNOSIS — R2681 Unsteadiness on feet: Secondary | ICD-10-CM | POA: Diagnosis not present

## 2023-05-01 DIAGNOSIS — M545 Low back pain, unspecified: Secondary | ICD-10-CM | POA: Diagnosis not present

## 2023-05-05 ENCOUNTER — Telehealth: Payer: Self-pay | Admitting: Cardiovascular Disease

## 2023-05-05 DIAGNOSIS — R278 Other lack of coordination: Secondary | ICD-10-CM | POA: Diagnosis not present

## 2023-05-05 DIAGNOSIS — M545 Low back pain, unspecified: Secondary | ICD-10-CM | POA: Diagnosis not present

## 2023-05-05 DIAGNOSIS — M6281 Muscle weakness (generalized): Secondary | ICD-10-CM | POA: Diagnosis not present

## 2023-05-05 DIAGNOSIS — R2681 Unsteadiness on feet: Secondary | ICD-10-CM | POA: Diagnosis not present

## 2023-05-05 MED ORDER — APIXABAN 5 MG PO TABS: 5.0000 mg | ORAL_TABLET | Freq: Two times a day (BID) | ORAL | 0 refills | Status: DC

## 2023-05-05 NOTE — Telephone Encounter (Signed)
Left voicemail to return call to office. Samples places at check in and is ready for pick up.

## 2023-05-05 NOTE — Telephone Encounter (Signed)
Okay to give samples - 5 mg Eliquis

## 2023-05-05 NOTE — Telephone Encounter (Signed)
Patient calling the office for samples of medication:   1.  What medication and dosage are you requesting samples for? ELIQUIS 5 MG TABS tablet   2.  Are you currently out of this medication? Not yet

## 2023-05-05 NOTE — Telephone Encounter (Signed)
Patient is requesting samples. Stated she needs relief from the cost before she has to pay again.

## 2023-05-06 ENCOUNTER — Other Ambulatory Visit (HOSPITAL_COMMUNITY): Payer: Self-pay | Admitting: *Deleted

## 2023-05-06 MED ORDER — DOFETILIDE 500 MCG PO CAPS: 500.0000 ug | ORAL_CAPSULE | Freq: Two times a day (BID) | ORAL | 1 refills | Status: DC

## 2023-05-07 DIAGNOSIS — M6281 Muscle weakness (generalized): Secondary | ICD-10-CM | POA: Diagnosis not present

## 2023-05-07 DIAGNOSIS — R2681 Unsteadiness on feet: Secondary | ICD-10-CM | POA: Diagnosis not present

## 2023-05-07 DIAGNOSIS — M545 Low back pain, unspecified: Secondary | ICD-10-CM | POA: Diagnosis not present

## 2023-05-07 DIAGNOSIS — R278 Other lack of coordination: Secondary | ICD-10-CM | POA: Diagnosis not present

## 2023-05-14 DIAGNOSIS — G4733 Obstructive sleep apnea (adult) (pediatric): Secondary | ICD-10-CM | POA: Diagnosis not present

## 2023-05-19 DIAGNOSIS — R2681 Unsteadiness on feet: Secondary | ICD-10-CM | POA: Diagnosis not present

## 2023-05-19 DIAGNOSIS — M6281 Muscle weakness (generalized): Secondary | ICD-10-CM | POA: Diagnosis not present

## 2023-05-19 DIAGNOSIS — M545 Low back pain, unspecified: Secondary | ICD-10-CM | POA: Diagnosis not present

## 2023-05-19 DIAGNOSIS — R278 Other lack of coordination: Secondary | ICD-10-CM | POA: Diagnosis not present

## 2023-05-21 ENCOUNTER — Ambulatory Visit (INDEPENDENT_AMBULATORY_CARE_PROVIDER_SITE_OTHER): Payer: Medicare Other | Admitting: Family Medicine

## 2023-05-21 VITALS — BP 120/66 | HR 88 | Temp 97.7°F | Wt 263.8 lb

## 2023-05-21 DIAGNOSIS — M6281 Muscle weakness (generalized): Secondary | ICD-10-CM | POA: Diagnosis not present

## 2023-05-21 DIAGNOSIS — R1310 Dysphagia, unspecified: Secondary | ICD-10-CM

## 2023-05-21 DIAGNOSIS — M545 Low back pain, unspecified: Secondary | ICD-10-CM | POA: Diagnosis not present

## 2023-05-21 DIAGNOSIS — R2681 Unsteadiness on feet: Secondary | ICD-10-CM | POA: Diagnosis not present

## 2023-05-21 DIAGNOSIS — R278 Other lack of coordination: Secondary | ICD-10-CM | POA: Diagnosis not present

## 2023-05-21 NOTE — Progress Notes (Unsigned)
Established Patient Office Visit  Subjective   [name], female    DOB: 1941/05/17  Age: 82 y.o. MRN: [id number]  Chief Complaint  Patient presents with   Sore Throat    Patient complains of throat tightness, x2 weeks    HPI  {History (Optional):23778} [name] has chronic problems including history of atrial fibrillation, tachybradycardia syndrome, pulmonary fibrosis, obstructive sleep apnea, GERD, obesity, depression.  She is followed closely by pulmonary regarding her pulmonary fibrosis.  She states she was on some experimental drugs but felt very poorly on those and decided to come off.  She has felt somewhat better since coming off.  However, her main issue right now she had some difficulties recently with dysphagia especially to solid foods.  She takes omeprazole daily.  Does have history of GERD.  Past history of esophageal dilatation but she was not sure when.  She states her appetite and weight are stable.  She has occasional difficulty with liquids but mostly solids such as chicken.  No pain with swallowing.  No recent documented weight loss.  She had barium swallow 3/29-23 which showed dilated esophagus with slight narrowing at the gastroesophageal junction through which a 13 mm barium tablet would not pass.  There was also note of a posterior cervical esophageal web nonobstructive to thin barium or tablet.  She feels like her dysphagia symptoms have been progressive in recent weeks.  Past Medical History:  Diagnosis Date   Alpha-1-antitrypsin deficiency carrier    Arthritis    "all over"   Atherosclerosis of aorta (HCC)    Atrial fibrillation (HCC)    Back pain    Complication of anesthesia    "I woke up during 2 different procedures" (10/04/2014)   Depression    Diastolic dysfunction    Eating disorder    Frequent UTI    GERD (gastroesophageal reflux disease)    Heart disease    Hypertension    Hypertriglyceridemia    Insulin resistance    Long  term current use of anticoagulant    Multiple lung nodules    Muscular deconditioning    MVP (mitral valve prolapse)    Obesity    OSA on CPAP    Osteoarthritis    Osteopenia    unspecified location   Pacemaker    PAF (paroxysmal atrial fibrillation) (HCC)    Positive ANA (antinuclear antibody)    Presbyesophagus    Presence of permanent cardiac pacemaker    Primary osteoarthritis of both hands    neck and spine and hips   Pulmonary fibrosis (HCC)    followed by pulmonolgy   PVC's (premature ventricular contractions)    Shortness of breath    Sinus arrest 10/2014   s/p Medtronic Advisa model A2DR01 serial number [id number]   Sleep apnea    Stroke (HCC) 01/2014   denies deficits on 10/04/2014   Tachy-brady syndrome (HCC) 10/2014   TIA (transient ischemic attack)    Vitamin D deficiency    Past Surgical History:  Procedure Laterality Date   CARDIOVERSION N/A 09/02/2017   Procedure: CARDIOVERSION;  Surgeon: Thurmon Fair, MD;  Location: [facility];  Service: Cardiovascular;  Laterality: N/A;   COLONOSCOPY  2019   ESOPHAGEAL DILATION     EXCISION VAGINAL CYST     benign nodules   INSERT / REPLACE / REMOVE PACEMAKER  10/04/2014   Medtronic Advisa model A2DR01 serial number [id number]   LOOP RECORDER EXPLANT N/A 10/04/2014   Procedure: LOOP RECORDER  EXPLANT;  Surgeon: Thurmon Fair, MD;  Location: [facility] CATH LAB;  Service: Cardiovascular;  Laterality: N/A;   LOOP RECORDER IMPLANT N/A 04/19/2014   Procedure: LOOP RECORDER IMPLANT;  Surgeon: Thurmon Fair, MD;  Location: [facility] CATH LAB;  Service: Cardiovascular;  Laterality: N/A;   NM MYOCAR PERF WALL MOTION  12/18/2007   normal   PERMANENT PACEMAKER INSERTION N/A 10/04/2014   Procedure: PERMANENT PACEMAKER INSERTION;  Surgeon: Thurmon Fair, MD;  Location: [facility] CATH LAB;  Service: Cardiovascular;  Laterality: N/A;   TUBAL LIGATION     US ECHOCARDIOGRAPHY  05/15/2010   LA mildly dilated,mild mitral annular ca+, AOV mildly sclerotic,  mild asymmetric LVH    reports that she quit smoking about 52 years ago. Her smoking use included cigarettes. She has a 36.00 pack-year smoking history. She has never used smokeless tobacco. She reports current alcohol use. She reports that she does not use drugs. family history includes Alcoholism in her brother; Alpha-1 antitrypsin deficiency in her daughter; Arthritis in her son; Breast cancer in her daughter; Diabetes in her son; Healthy in her brother, daughter, and sister; Heart attack in her mother; Heart failure in her father; Prostate cancer in her brother, brother, and brother; Stroke in her brother, brother, and father; Sudden death in her mother. Allergies  Allergen Reactions   Cefazolin Hives and Itching    Other reaction(s): severe hives   Pseudoephedrine Hcl     Other reaction(s): palpitations (moderate)   Ergotamine     unknown   Other Other (See Comments)   Pentobarbital     unknown   Pirfenidone     unknown   Caffeine Palpitations    Review of Systems  Constitutional:  Negative for chills, fever and weight loss.  HENT:  Negative for sore throat.   Respiratory:  Positive for shortness of breath. Negative for cough.   Cardiovascular:  Negative for chest pain.      Objective:     BP 120/66 (BP Location: Left Arm, Patient Position: Sitting, Cuff Size: Large)   Pulse 88   Temp 97.7 F (36.5 C) (Oral)   Wt 263 lb 12.8 oz (119.7 kg)   LMP  (LMP Unknown)   SpO2 96%   BMI 41.32 kg/m  {Vitals History (Optional):23777}  Physical Exam Vitals reviewed.  Constitutional:      General: She is not in acute distress. HENT:     Mouth/Throat:     Mouth: Mucous membranes are moist.     Pharynx: Oropharynx is clear.  Cardiovascular:     Rate and Rhythm: Normal rate and regular rhythm.  Pulmonary:     Effort: Pulmonary effort is normal. No respiratory distress.  Musculoskeletal:     Cervical back: Neck supple.  Lymphadenopathy:     Cervical: No cervical adenopathy.   Neurological:     Mental Status: She is alert.      No results found for any visits on 05/21/23.  {Labs (Optional):23779}  The ASCVD Risk score (Arnett DK, et al., 2019) failed to calculate for the following reasons:   The 2019 ASCVD risk score is only valid for ages 36 to 42   The patient has a prior MI or stroke diagnosis    Assessment & Plan:   Patient presents with several week if not month history of progressive dysphagia without any associated weight loss.  Known history of GERD on chronic omeprazole.  Specially having some progressive issues with solid foods.  Barium swallowing study back in 2023 as above. -  Set up GI referral for further evaluation -Continue omeprazole 40 mg daily in the meantime -She is aware to avoid hard to chew foods such as steak  Evelena Peat, MD

## 2023-05-26 DIAGNOSIS — R2681 Unsteadiness on feet: Secondary | ICD-10-CM | POA: Diagnosis not present

## 2023-05-26 DIAGNOSIS — M6281 Muscle weakness (generalized): Secondary | ICD-10-CM | POA: Diagnosis not present

## 2023-05-26 DIAGNOSIS — R278 Other lack of coordination: Secondary | ICD-10-CM | POA: Diagnosis not present

## 2023-05-26 DIAGNOSIS — M545 Low back pain, unspecified: Secondary | ICD-10-CM | POA: Diagnosis not present

## 2023-05-28 DIAGNOSIS — M545 Low back pain, unspecified: Secondary | ICD-10-CM | POA: Diagnosis not present

## 2023-05-28 DIAGNOSIS — R278 Other lack of coordination: Secondary | ICD-10-CM | POA: Diagnosis not present

## 2023-05-28 DIAGNOSIS — M6281 Muscle weakness (generalized): Secondary | ICD-10-CM | POA: Diagnosis not present

## 2023-05-28 DIAGNOSIS — R2681 Unsteadiness on feet: Secondary | ICD-10-CM | POA: Diagnosis not present

## 2023-05-30 ENCOUNTER — Telehealth: Payer: Self-pay | Admitting: Gastroenterology

## 2023-05-30 NOTE — Telephone Encounter (Signed)
Good Afternoon Dr. Barron Alvine,  Supervising MD for today PM  Patient called stating that her PCP had sent a referral for her to be seen for Dysphagia. After looking patients chart patient has previous GI history with Dr. Kinnie Scales with [facility] from 2020-2023. Patient is requesting transfer of care due to Dr. Kinnie Scales retiring. Patients records are in [facility], will you please review and advise on scheduling?  Thank you.

## 2023-06-03 DIAGNOSIS — M6281 Muscle weakness (generalized): Secondary | ICD-10-CM | POA: Diagnosis not present

## 2023-06-03 DIAGNOSIS — M545 Low back pain, unspecified: Secondary | ICD-10-CM | POA: Diagnosis not present

## 2023-06-03 DIAGNOSIS — L820 Inflamed seborrheic keratosis: Secondary | ICD-10-CM | POA: Diagnosis not present

## 2023-06-03 DIAGNOSIS — R2681 Unsteadiness on feet: Secondary | ICD-10-CM | POA: Diagnosis not present

## 2023-06-03 DIAGNOSIS — R278 Other lack of coordination: Secondary | ICD-10-CM | POA: Diagnosis not present

## 2023-06-03 DIAGNOSIS — L821 Other seborrheic keratosis: Secondary | ICD-10-CM | POA: Diagnosis not present

## 2023-06-04 ENCOUNTER — Encounter: Payer: Self-pay | Admitting: Gastroenterology

## 2023-06-04 NOTE — Telephone Encounter (Signed)
Sent fax to [facility] requesting to send any previous EGD's and path.

## 2023-06-04 NOTE — Telephone Encounter (Signed)
Patient scheduled for 10/4.

## 2023-06-10 ENCOUNTER — Other Ambulatory Visit: Payer: Self-pay

## 2023-06-10 ENCOUNTER — Emergency Department (HOSPITAL_COMMUNITY): Payer: Medicare Other

## 2023-06-10 ENCOUNTER — Emergency Department (HOSPITAL_COMMUNITY)
Admission: EM | Admit: 2023-06-10 | Discharge: 2023-06-10 | Disposition: A | Discharge status: Home / Self Care | Payer: Medicare Other | Attending: Emergency Medicine | Admitting: Emergency Medicine

## 2023-06-10 DIAGNOSIS — G4489 Other headache syndrome: Secondary | ICD-10-CM | POA: Diagnosis not present

## 2023-06-10 DIAGNOSIS — S0990XA Unspecified injury of head, initial encounter: Secondary | ICD-10-CM | POA: Insufficient documentation

## 2023-06-10 DIAGNOSIS — I6603 Occlusion and stenosis of bilateral middle cerebral arteries: Secondary | ICD-10-CM | POA: Insufficient documentation

## 2023-06-10 DIAGNOSIS — R519 Headache, unspecified: Secondary | ICD-10-CM | POA: Diagnosis not present

## 2023-06-10 DIAGNOSIS — R29818 Other symptoms and signs involving the nervous system: Secondary | ICD-10-CM | POA: Diagnosis not present

## 2023-06-10 DIAGNOSIS — I4891 Unspecified atrial fibrillation: Secondary | ICD-10-CM | POA: Diagnosis not present

## 2023-06-10 DIAGNOSIS — Z7901 Long term (current) use of anticoagulants: Secondary | ICD-10-CM | POA: Diagnosis not present

## 2023-06-10 DIAGNOSIS — W19XXXA Unspecified fall, initial encounter: Secondary | ICD-10-CM

## 2023-06-10 DIAGNOSIS — S3993XA Unspecified injury of pelvis, initial encounter: Secondary | ICD-10-CM | POA: Diagnosis not present

## 2023-06-10 DIAGNOSIS — Z950 Presence of cardiac pacemaker: Secondary | ICD-10-CM | POA: Diagnosis not present

## 2023-06-10 DIAGNOSIS — R42 Dizziness and giddiness: Secondary | ICD-10-CM

## 2023-06-10 DIAGNOSIS — W01198A Fall on same level from slipping, tripping and stumbling with subsequent striking against other object, initial encounter: Secondary | ICD-10-CM | POA: Insufficient documentation

## 2023-06-10 DIAGNOSIS — R600 Localized edema: Secondary | ICD-10-CM | POA: Insufficient documentation

## 2023-06-10 DIAGNOSIS — M47816 Spondylosis without myelopathy or radiculopathy, lumbar region: Secondary | ICD-10-CM | POA: Diagnosis not present

## 2023-06-10 DIAGNOSIS — S299XXA Unspecified injury of thorax, initial encounter: Secondary | ICD-10-CM | POA: Diagnosis not present

## 2023-06-10 DIAGNOSIS — M1611 Unilateral primary osteoarthritis, right hip: Secondary | ICD-10-CM | POA: Diagnosis not present

## 2023-06-10 DIAGNOSIS — I6623 Occlusion and stenosis of bilateral posterior cerebral arteries: Secondary | ICD-10-CM | POA: Diagnosis not present

## 2023-06-10 LAB — URINALYSIS, ROUTINE W REFLEX MICROSCOPIC
Bilirubin Urine: NEGATIVE
Glucose, UA: NEGATIVE mg/dL
Hgb urine dipstick: NEGATIVE
Ketones, ur: NEGATIVE mg/dL
Leukocytes,Ua: NEGATIVE
Nitrite: NEGATIVE
Protein, ur: NEGATIVE mg/dL
Specific Gravity, Urine: 1.025 (ref 1.005–1.030)
pH: 7 (ref 5.0–8.0)

## 2023-06-10 LAB — COMPREHENSIVE METABOLIC PANEL
ALT: 18 U/L (ref 0–44)
AST: 25 U/L (ref 15–41)
Albumin: 3.2 g/dL — ABNORMAL LOW (ref 3.5–5.0)
Alkaline Phosphatase: 76 U/L (ref 38–126)
Anion gap: 8 (ref 5–15)
BUN: 20 mg/dL (ref 8–23)
CO2: 27 mmol/L (ref 22–32)
Calcium: 8.8 mg/dL — ABNORMAL LOW (ref 8.9–10.3)
Chloride: 102 mmol/L (ref 98–111)
Creatinine, Ser: 1.18 mg/dL — ABNORMAL HIGH (ref 0.44–1.00)
GFR, Estimated: 46 mL/min — ABNORMAL LOW (ref >60)
Glucose, Bld: 121 mg/dL — ABNORMAL HIGH (ref 70–99)
Potassium: 4.1 mmol/L (ref 3.5–5.1)
Sodium: 137 mmol/L (ref 135–145)
Total Bilirubin: 0.6 mg/dL (ref 0.3–1.2)
Total Protein: 7 g/dL (ref 6.5–8.1)

## 2023-06-10 LAB — CBC
HCT: 48.1 % — ABNORMAL HIGH (ref 36.0–46.0)
Hemoglobin: 15.6 g/dL — ABNORMAL HIGH (ref 12.0–15.0)
MCH: 28 pg (ref 26.0–34.0)
MCHC: 32.4 g/dL (ref 30.0–36.0)
MCV: 86.2 fL (ref 80.0–100.0)
Platelets: 238 10*3/uL (ref 150–400)
RBC: 5.58 MIL/uL — ABNORMAL HIGH (ref 3.87–5.11)
RDW: 13.4 % (ref 11.5–15.5)
WBC: 8.8 10*3/uL (ref 4.0–10.5)
nRBC: 0 % (ref 0.0–0.2)

## 2023-06-10 LAB — PROTIME-INR
INR: 1.1 (ref 0.8–1.2)
Prothrombin Time: 14.5 seconds (ref 11.4–15.2)

## 2023-06-10 LAB — LACTIC ACID, PLASMA: Lactic Acid, Venous: 1.2 mmol/L (ref 0.5–1.9)

## 2023-06-10 LAB — MAGNESIUM: Magnesium: 2.1 mg/dL (ref 1.7–2.4)

## 2023-06-10 MED ORDER — MECLIZINE HCL 25 MG PO TABS: 25.0000 mg | ORAL_TABLET | Freq: Three times a day (TID) | ORAL | 0 refills | Status: DC | PRN

## 2023-06-10 MED ORDER — IOHEXOL 350 MG/ML SOLN
75.0000 mL | Freq: Once | INTRAVENOUS | Status: AC | PRN
  Administered 2023-06-10: 75 mL via INTRAVENOUS

## 2023-06-10 MED ORDER — LACTATED RINGERS IV BOLUS
1000.0000 mL | Freq: Once | INTRAVENOUS | Status: AC
  Administered 2023-06-10: 1000 mL via INTRAVENOUS

## 2023-06-10 MED ORDER — MECLIZINE HCL 25 MG PO TABS
12.5000 mg | ORAL_TABLET | Freq: Once | ORAL | Status: AC
  Administered 2023-06-10: 12.5 mg via ORAL
  Filled 2023-06-10: qty 1

## 2023-06-10 NOTE — ED Notes (Signed)
Pt up to the bathroom using a walker with one person assist.

## 2023-06-10 NOTE — ED Notes (Signed)
Tech ambulated pt. to restroom w/ assistance from walker. Pt. Did not endorse any pain through this process. Pt. Returned to bed and reconnected to the monitor.

## 2023-06-10 NOTE — Progress Notes (Signed)
Orthopedic Tech Progress Note Patient Details:  [name] [name] 02/26/1941 [id number]  Level 2 trauma   [name], female   DOB: [birth year], 82 y.o.   MRN: [id number]  Donald Pore 06/10/2023, 1:43 PM

## 2023-06-10 NOTE — ED Notes (Signed)
Patient is from facility, but says a friend is coming to pick her up.

## 2023-06-10 NOTE — ED Provider Notes (Signed)
[place] EMERGENCY DEPARTMENT AT [facility] Provider Note   CSN: [id number] Arrival date & time: 06/10/23  1314     History  Chief Complaint  Patient presents with   Fall    Fall on eliquis. Reports mulitple falls over ths past 2 days. Reports hitting head. Denise any pain at this time. VSS pta. Bg 151 pta.    [name] [name] is a 82 y.o. female.  HPI Patient presents as a level 2 trauma.  She arrives via EMS.  History is obtained those individuals as well.  She notes that she was in her usual state of health until 2 days ago.  Since that time she has had episodes of brief dizziness, several of which have resulted in fall.  This most recent fall resulted in head trauma, but no loss of consciousness, nor any new weakness focally.  EMS reports no hemodynamic instability en route.    Home Medications Prior to Admission medications   Medication Sig Start Date End Date Taking? Authorizing Provider  apixaban (ELIQUIS) 5 MG TABS tablet Take 1 tablet (5 mg total) by mouth 2 (two) times daily. 05/05/23   Croitoru, Mihai, MD  benzonatate (TESSALON) 100 MG capsule Take 1 capsule (100 mg total) by mouth every 6 (six) hours as needed for cough. 11/15/22   Leslye Peer, MD  Cholecalciferol (VITAMIN D3 PO) Take 1 tablet by mouth daily.    [provider]  diazepam (VALIUM) 5 MG tablet TAKE 1 TABLET ONCE A DAY AS NEEDED. 11/28/22   Burchette, Elberta Fortis, MD  dofetilide (TIKOSYN) 500 MCG capsule Take 1 capsule (500 mcg total) by mouth 2 (two) times daily. 05/06/23   Fenton, Clint R, PA  ELIQUIS 5 MG TABS tablet TAKE ONE TABLET BY MOUTH TWICE DAILY 03/10/23   Croitoru, Mihai, MD  ipratropium (ATROVENT) 0.06 % nasal spray Place 2 sprays into both nostrils 4 (four) times daily as needed for rhinitis. 11/15/22   Leslye Peer, MD  Magnesium 400 MG CAPS Take 400 mg by mouth daily. 03/15/22   Newman Nip, NP  metoprolol tartrate (LOPRESSOR) 25 MG tablet Take 0.5 tablets (12.5 mg total)  by mouth 2 (two) times daily. 01/02/23   Croitoru, Mihai, MD  nitroGLYCERIN (NITROSTAT) 0.4 MG SL tablet Place 1 tablet (0.4 mg total) under the tongue every 5 (five) minutes as needed for chest pain. 09/19/20   Croitoru, Mihai, MD  omeprazole (PRILOSEC) 40 MG capsule Take 1 capsule by mouth daily. 02/02/21   [provider]  oxyCODONE (ROXICODONE) 5 MG immediate release tablet Take 0.5 tablets (2.5 mg total) by mouth every 8 (eight) hours as needed for severe pain. 04/04/23   [name] Saint, MD  sertraline (ZOLOFT) 100 MG tablet TAKE ONE TABLET BY MOUTH ONCE DAILY. 12/11/22   Burchette, Elberta Fortis, MD      Allergies    Cefazolin, Pseudoephedrine hcl, Ergotamine, Other, Pentobarbital, Pirfenidone, and Caffeine    Review of Systems   Review of Systems  All other systems reviewed and are negative.   Physical Exam Updated Vital Signs BP (!) 140/80   Pulse (!) 59   Temp 98.5 F (36.9 C)   Resp (!) 25   Ht 5\' 7"  (1.702 m)   Wt 119.7 kg   LMP  (LMP Unknown)   SpO2 95%   BMI 41.35 kg/m  Physical Exam Vitals and nursing note reviewed.  Constitutional:      General: She is not in acute distress.  Appearance: She is well-developed.  HENT:     Head: Normocephalic and atraumatic.  Eyes:     Conjunctiva/sclera: Conjunctivae normal.  Cardiovascular:     Rate and Rhythm: Normal rate and regular rhythm.  Pulmonary:     Effort: Pulmonary effort is normal. No respiratory distress.     Breath sounds: Normal breath sounds. No stridor.  Abdominal:     General: There is no distension.  Musculoskeletal:     Cervical back: Normal range of motion and neck supple.     Right lower leg: Edema present.     Left lower leg: Edema present.  Skin:    General: Skin is warm and dry.  Neurological:     Mental Status: She is alert and oriented to person, place, and time.     Cranial Nerves: No cranial nerve deficit.  Psychiatric:        Mood and Affect: Mood normal.        Thought Content:  Thought content normal.     Comments: Patient joking during exam     ED Results / Procedures / Treatments   Labs (all labs ordered are listed, but only abnormal results are displayed) Labs Reviewed  COMPREHENSIVE METABOLIC PANEL - Abnormal; Notable for the following components:      Result Value   Glucose, Bld 121 (*)    Creatinine, Ser 1.18 (*)    Calcium 8.8 (*)    Albumin 3.2 (*)    GFR, Estimated 46 (*)    All other components within normal limits  CBC - Abnormal; Notable for the following components:   RBC 5.58 (*)    Hemoglobin 15.6 (*)    HCT 48.1 (*)    All other components within normal limits  LACTIC ACID, PLASMA  PROTIME-INR  MAGNESIUM  URINALYSIS, ROUTINE W REFLEX MICROSCOPIC  ETHANOL    EKG EKG Interpretation Date/Time:  Tuesday June 10 2023 13:24:31 EDT Ventricular Rate:  80 PR Interval:  236 QRS Duration:  85 QT Interval:  393 QTC Calculation: 454 R Axis:   78  Text Interpretation: ATRIAL PACED RHYTHM Premature ventricular complexes Artifact Abnormal ECG Confirmed by Gerhard Munch [phone]) on 06/10/2023 2:55:37 PM  Radiology DG Pelvis Portable  Result Date: 06/10/2023 CLINICAL DATA:  Trauma EXAM: PORTABLE PELVIS 1-2 VIEWS COMPARISON:  CT done on 05/10/2022 FINDINGS: No recent fracture is seen. Degenerative changes are noted in right hip with joint space narrowing and bony spurs. There is mild flattening of right femoral head which has not changed in comparison with the previous CT. Degenerative changes are noted in visualized lower lumbar spine. IMPRESSION: No recent fracture or dislocation is seen. Degenerative changes are noted in right hip. Lumbar spondylosis. Electronically Signed   By: Ernie Avena M.D.   On: 06/10/2023 14:34   CT HEAD WO CONTRAST  Result Date: 06/10/2023 CLINICAL DATA:  Head trauma, moderate to severe.  Fall. EXAM: CT HEAD WITHOUT CONTRAST TECHNIQUE: Contiguous axial images were obtained from the base of the skull through the  vertex without intravenous contrast. RADIATION DOSE REDUCTION: This exam was performed according to the departmental dose-optimization program which includes automated exposure control, adjustment of the mA and/or kV according to patient size and/or use of iterative reconstruction technique. COMPARISON:  Head CT 10/29/2017. FINDINGS: Brain: No acute intracranial hemorrhage. Gray-white differentiation is preserved. No hydrocephalus or extra-axial collection. No mass effect or midline shift. Vascular: No hyperdense vessel or unexpected calcification. Skull: No calvarial fracture or suspicious bone lesion. Skull base is  unremarkable. Sinuses/Orbits: Unremarkable. Other: None. IMPRESSION: No acute intracranial abnormality. Electronically Signed   By: Orvan Falconer M.D.   On: 06/10/2023 14:32   DG Chest Port 1 View  Result Date: 06/10/2023 CLINICAL DATA:  Trauma. EXAM: PORTABLE CHEST 1 VIEW COMPARISON:  12/15/2021. FINDINGS: Left chest dual-chamber pacemaker with leads projecting over the right atrium and ventricle. Unchanged chronic interstitial prominence. No consolidation or pulmonary edema. Stable cardiac and mediastinal contours. No pleural effusion or pneumothorax. IMPRESSION: No evidence of acute cardiopulmonary disease. Electronically Signed   By: Orvan Falconer M.D.   On: 06/10/2023 14:25    Procedures Procedures    Medications Ordered in ED Medications - No data to display  ED Course/ Medical Decision Making/ A&P                             Medical Decision Making Elderly female on anticoagulation for history of tachybradycardia syndrome, with pacemaker, presents with concern for dizziness, frequent falls.  Patient is awake, alert, distally neurovascularly unremarkable initially, but given history, anticoagulation, broad differential for dizziness, fall, both pre and post effects considered.  On monitor patient has paced rhythm 80 abnormal Pulse ox 100% room air normal   Amount and/or  Complexity of Data Reviewed Independent Historian: EMS External Data Reviewed: notes. Labs: ordered. Decision-making details documented in ED Course. Radiology: ordered and independent interpretation performed. Decision-making details documented in ED Course. ECG/medicine tests: ordered and independent interpretation performed. Decision-making details documented in ED Course.  Risk Decision regarding hospitalization. Diagnosis or treatment significantly limited by social determinants of health.   3:23 PM Patient continues to complain of episodic dizziness, described as unsteadiness.  Labs thus far reviewed, imaging thus far reviewed.  No evidence for intracranial abnormality, labs consistent with prior.  Given her ongoing episodic dizziness, additional considerations including stroke, carotid or other arterial occlusion considered.  Patient is ineligible for MRI given her pacemaker, CTA ordered, neurology will consult.  On signout patient is pending these studies as well as interrogation of the patient's pacemaker.        Final Clinical Impression(s) / ED Diagnoses Final diagnoses:  Dizziness  Fall, initial encounter     Gerhard Munch, MD 06/10/23 1525

## 2023-06-10 NOTE — ED Notes (Signed)
Pt has returned from ct 

## 2023-06-10 NOTE — Discharge Instructions (Addendum)
We evaluated you for your dizziness and falls.  Your CT scans did not show any sign of injury from your fall.  We do not think your symptoms are due to a stroke.  Your testing was overall reassuring.  Your symptoms may be partially due to dehydration and partially due to peripheral vertigo.  I have prescribed you medication called meclizine which you can take if you experience recurrent vertigo symptoms.  Please be very careful when taking this medicine as it can also make you drowsy and at slight risk of falls.  Please be sure to drink a lot of fluids.  It is great that you are able to follow-up closely with your primary doctor tomorrow.  If you have any new symptoms, such as chest pain, difficulty breathing, facial droop, numbness or tingling, weakness, trouble swallowing, vision changes, confusion, fevers or chills, or any other symptoms, please return to the emergency department for reassessment.

## 2023-06-10 NOTE — ED Provider Notes (Addendum)
Physical Exam  BP (!) 141/54   Pulse (!) 59   Temp 97.7 F (36.5 C) (Oral)   Resp 17   Ht 5\' 7"  (1.702 m)   Wt 119.7 kg   LMP  (LMP Unknown)   SpO2 96%   BMI 41.35 kg/m   Physical Exam Vitals and nursing note reviewed.  Constitutional:      Appearance: Normal appearance.  HENT:     Head: Normocephalic and atraumatic.     Mouth/Throat:     Mouth: Mucous membranes are moist.  Eyes:     Conjunctiva/sclera: Conjunctivae normal.  Cardiovascular:     Rate and Rhythm: Normal rate.  Pulmonary:     Effort: Pulmonary effort is normal. No respiratory distress.  Abdominal:     General: Abdomen is flat.  Musculoskeletal:        General: No deformity.  Skin:    General: Skin is warm and dry.     Capillary Refill: Capillary refill takes less than 2 seconds.  Neurological:     General: No focal deficit present.     Mental Status: She is alert. Mental status is at baseline.     Comments: Cranial nerves II through XII intact, strength 5 out of 5 in the bilateral upper and lower extremities, no sensory deficit to light touch, no dysmetria on finger-nose-finger testing, normal gait with walker  Psychiatric:        Mood and Affect: Mood normal.        Behavior: Behavior normal.     Procedures  Procedures  ED Course / MDM   Clinical Course as of 06/10/23 2109  Tue Jun 10, 2023  1544 Received sign out from Dr. Jeraldine Loots, had dizziness for a few days and fall today. Getting CTA neck. Pending neurology consult [WS]  1655 DG Pelvis Portable [WS]  1732 Discussed CTA with Dr. Otelia Limes, neurology.  Recommended hints exam.  HINTS exam for me negative for signs of central vertigo.  He recommends outpatient follow-up.  Patient also orthostatic.  Suspect her dizziness is combination of orthostatic hypotension and also peripheral vertigo, patient does report that her episodes of vertigo began after bending over and associated with spinning.  She denies any symptoms currently.  Overall concern for  central cause is low.  Unfortunately patient reports she is not able to receive MRI due to her pacemaker.  Reviewed pacemaker interrogation which shows no atrial arrhythmias, no ventricular arrhythmias in the past year.  Extremely low concern for cardiac cause.  Will reassess after fluids.  If patient feeling better likely discharge. [WS]  1808 Patient feels better after IV fluids. Will ambulate. If ambulation trial successful patient will be discharged back to facility. Discussed results with patient including CTA head.  [WS]  1936 Patient ambulated without difficulty. UA not concerning for infection. Will discharge patient to home. All questions answered. Patient comfortable with plan of discharge. Return precautions discussed with patient and specified on the after visit summary.  [WS]  2108 Patient had recurrent episode of dizziness after turning her head.  Symptoms very consistent with peripheral vertigo.  Given continued was offered admission for observation and further treatment of her vertigo but the patient preferred to go home and manage her symptoms at home.  Her friend will stay with her tonight. [WS]    Clinical Course User Index [WS] Lonell Grandchild, MD   Medical Decision Making Amount and/or Complexity of Data Reviewed Labs: ordered. Radiology: ordered. Decision-making details documented in ED Course. ECG/medicine  tests: ordered.  Risk Prescription drug management.          Lonell Grandchild, MD 06/10/23 Aretha Parrot    Lonell Grandchild, MD 06/10/23 2109

## 2023-06-10 NOTE — ED Notes (Signed)
Trauma Response Nurse Documentation  [name] [name] is a 82 y.o. female arriving to [facility] ED via EMS  On Eliquis (apixaban) daily. Trauma was activated as a Level 2 based on the following trauma criteria Elderly patients > 65 with head trauma on anti-coagulation (excluding ASA).  Patient cleared for CT by Dr. Jeraldine Loots. Pt transported to CT with trauma response nurse present to monitor. RN remained with the patient throughout their absence from the department for clinical observation. GCS 15.  History   Past Medical History:  Diagnosis Date   Alpha-1-antitrypsin deficiency carrier    Arthritis    "all over"   Atherosclerosis of aorta (HCC)    Atrial fibrillation (HCC)    Back pain    Complication of anesthesia    "I woke up during 2 different procedures" (10/04/2014)   Depression    Diastolic dysfunction    Eating disorder    Frequent UTI    GERD (gastroesophageal reflux disease)    Heart disease    Hypertension    Hypertriglyceridemia    Insulin resistance    Long term current use of anticoagulant    Multiple lung nodules    Muscular deconditioning    MVP (mitral valve prolapse)    Obesity    OSA on CPAP    Osteoarthritis    Osteopenia    unspecified location   Pacemaker    PAF (paroxysmal atrial fibrillation) (HCC)    Positive ANA (antinuclear antibody)    Presbyesophagus    Presence of permanent cardiac pacemaker    Primary osteoarthritis of both hands    neck and spine and hips   Pulmonary fibrosis (HCC)    followed by pulmonolgy   PVC's (premature ventricular contractions)    Shortness of breath    Sinus arrest 10/2014   s/p Medtronic Advisa model [id number] serial number [id number] H   Sleep apnea    Stroke (HCC) 01/2014   denies deficits on 10/04/2014   Tachy-brady syndrome (HCC) 10/2014   TIA (transient ischemic attack)    Vitamin D deficiency      Past Surgical History:  Procedure Laterality Date   CARDIOVERSION N/A 09/02/2017   Procedure: CARDIOVERSION;   Surgeon: Thurmon Fair, MD;  Location: [facility] ENDOSCOPY;  Service: Cardiovascular;  Laterality: N/A;   COLONOSCOPY  2019   ESOPHAGEAL DILATION     EXCISION VAGINAL CYST     benign nodules   INSERT / REPLACE / REMOVE PACEMAKER  10/04/2014   Medtronic Advisa model [id number] serial number [id number] H   LOOP RECORDER EXPLANT N/A 10/04/2014   Procedure: LOOP RECORDER EXPLANT;  Surgeon: Thurmon Fair, MD;  Location: [facility] CATH LAB;  Service: Cardiovascular;  Laterality: N/A;   LOOP RECORDER IMPLANT N/A 04/19/2014   Procedure: LOOP RECORDER IMPLANT;  Surgeon: Thurmon Fair, MD;  Location: [facility] CATH LAB;  Service: Cardiovascular;  Laterality: N/A;   NM MYOCAR PERF WALL MOTION  12/18/2007   normal   PERMANENT PACEMAKER INSERTION N/A 10/04/2014   Procedure: PERMANENT PACEMAKER INSERTION;  Surgeon: Thurmon Fair, MD;  Location: [facility] CATH LAB;  Service: Cardiovascular;  Laterality: N/A;   TUBAL LIGATION     US ECHOCARDIOGRAPHY  05/15/2010   LA mildly dilated,mild mitral annular ca+, AOV mildly sclerotic, mild asymmetric LVH     Initial Focused Assessment (If applicable, or please see trauma documentation): Patient A&Ox4, GCS 15, PERR 3 Airway intact, bilateral breath sounds Per patient dizziness when moving  CT's Completed:   CT Head   Interventions:  IV, labs  CXR/PXR CT Head  Event Summary: Patient to ED after several falls at friends home, one where she fell backwards and hit her head. Per patient she has been having "dizzy spells" all of a sudden causing her to fall. Imaging was completed which was negative for injury. CTA completed showing areas of severe stenosis. Pending full evaluation, no injuries related to trauma at this time.   Bedside handoff with ED RN Shawn.    [name]  Trauma Response RN  Please call TRN at [phone] for further assistance.

## 2023-06-10 NOTE — ED Notes (Signed)
Pt to CT

## 2023-06-11 ENCOUNTER — Encounter: Payer: Self-pay | Admitting: Family Medicine

## 2023-06-11 ENCOUNTER — Ambulatory Visit (INDEPENDENT_AMBULATORY_CARE_PROVIDER_SITE_OTHER): Payer: Medicare Other | Admitting: Family Medicine

## 2023-06-11 VITALS — BP 94/52 | HR 62 | Temp 98.2°F | Wt 261.0 lb

## 2023-06-11 DIAGNOSIS — I2510 Atherosclerotic heart disease of native coronary artery without angina pectoris: Secondary | ICD-10-CM

## 2023-06-11 DIAGNOSIS — J8410 Pulmonary fibrosis, unspecified: Secondary | ICD-10-CM | POA: Diagnosis not present

## 2023-06-11 DIAGNOSIS — G4733 Obstructive sleep apnea (adult) (pediatric): Secondary | ICD-10-CM

## 2023-06-11 DIAGNOSIS — R42 Dizziness and giddiness: Secondary | ICD-10-CM

## 2023-06-11 DIAGNOSIS — I672 Cerebral atherosclerosis: Secondary | ICD-10-CM | POA: Insufficient documentation

## 2023-06-11 DIAGNOSIS — I480 Paroxysmal atrial fibrillation: Secondary | ICD-10-CM | POA: Diagnosis not present

## 2023-06-11 DIAGNOSIS — J9611 Chronic respiratory failure with hypoxia: Secondary | ICD-10-CM

## 2023-06-11 NOTE — Progress Notes (Signed)
Subjective:    [name], female    DOB: 1941/05/12, 82 y.o.   MRN: [id number]  HPI Here with a friend to follow up an ED visit yesterday for dizziness. She has had a few episodes of dizziness in the past, but none for years. While at home 2 days ago she felt the sudden onset of dizziness like the room was spinning, and this caused her to lose her balance and fall. She landed on the floor but did not hit her head. There was no LOC, and she was able to get up fairly quickly. Then the same thing happened yesterday when a sudden bout of dizziness came on. She fell again, this time striking her head against a wall. Again no LOC. She had soreness over her back and pelvis, but no headache. She was seen at the ED with normal level of consciousness. Her exam revealed soreness ob her back and pelvis, but her head and neck appeared to be clear. She had a CT angiogram of the head and neck which showed wide spread atherosclerotic changes but no acute infarcts. She had moderate stenoses in bilateral MCA's and PCA's. Her carotids showed no significant stenoses. No bleeding or edema. Xrays of her back and pelvis were clear. Her labs were unremarkable. EKG was normal. She was given IV fluids, and she felt much better. She was sent home with Meclizine to use as needed. She has not taken any of this. She denies any dizziness or disequilibrium today, but she says she says her thinking is a bit foggy. No vision or speech changes. She had an ECHO on 12-12-22 showing an EF of 60-65% and Grade I diastolic dysfunction.    Review of Systems  Constitutional: Negative.   HENT: Negative.    Eyes: Negative.   Respiratory: Negative.    Cardiovascular: Negative.   Gastrointestinal: Negative.   Genitourinary: Negative.   Neurological:  Positive for light-headedness. Negative for dizziness, tremors, seizures, syncope, facial asymmetry, speech difficulty, weakness, numbness and headaches.       Objective:    Physical Exam Constitutional:      Appearance: Normal appearance. She is not ill-appearing.  HENT:     Head: Normocephalic and atraumatic.     Right Ear: Tympanic membrane, ear canal and external ear normal.     Left Ear: Tympanic membrane, ear canal and external ear normal.     Nose: Nose normal.     Mouth/Throat:     Pharynx: Oropharynx is clear.  Eyes:     Extraocular Movements: Extraocular movements intact.     Pupils: Pupils are equal, round, and reactive to light.  Cardiovascular:     Rate and Rhythm: Normal rate. Rhythm irregular.     Pulses: Normal pulses.     Heart sounds: Normal heart sounds.  Pulmonary:     Effort: Pulmonary effort is normal.     Breath sounds: Normal breath sounds.  Musculoskeletal:     Cervical back: Neck supple. No rigidity.     Right lower leg: No edema.     Left lower leg: No edema.  Neurological:     General: No focal deficit present.     Mental Status: She is alert and oriented to person, place, and time.     Cranial Nerves: No cranial nerve deficit.     Motor: No weakness.     Coordination: Coordination normal.     Gait: Gait normal.  Assessment & Plan:  She has had several bouts of vertigo in the past few days, and this appears to have been peripheral. She struck her head during the last fall but she seems to have no residual effects from this. She has Meclizine at home to use if the dizziness returns. She will drink plenty of fluids. We will refer her to Neurology for further evaluation. We spent a total of (35   ) minutes reviewing records and discussing these issues.  Gershon Crane, MD

## 2023-06-12 DIAGNOSIS — M545 Low back pain, unspecified: Secondary | ICD-10-CM | POA: Diagnosis not present

## 2023-06-12 DIAGNOSIS — R2681 Unsteadiness on feet: Secondary | ICD-10-CM | POA: Diagnosis not present

## 2023-06-12 DIAGNOSIS — R278 Other lack of coordination: Secondary | ICD-10-CM | POA: Diagnosis not present

## 2023-06-12 DIAGNOSIS — M6281 Muscle weakness (generalized): Secondary | ICD-10-CM | POA: Diagnosis not present

## 2023-06-16 ENCOUNTER — Telehealth: Payer: Self-pay | Admitting: Internal Medicine

## 2023-06-16 NOTE — Telephone Encounter (Signed)
Got message from [employer] for o2 cert. Alos not seen since Jan 2024  Plan - do , do spiro.dlco and have her see me next 4-8 weeks

## 2023-06-18 ENCOUNTER — Encounter: Payer: Self-pay | Admitting: Neurology

## 2023-06-18 DIAGNOSIS — M545 Low back pain, unspecified: Secondary | ICD-10-CM | POA: Diagnosis not present

## 2023-06-18 DIAGNOSIS — R2681 Unsteadiness on feet: Secondary | ICD-10-CM | POA: Diagnosis not present

## 2023-06-18 DIAGNOSIS — M6281 Muscle weakness (generalized): Secondary | ICD-10-CM | POA: Diagnosis not present

## 2023-06-18 DIAGNOSIS — R278 Other lack of coordination: Secondary | ICD-10-CM | POA: Diagnosis not present

## 2023-06-19 NOTE — Telephone Encounter (Signed)
Just haver he come and see me. Do not worry about and PFT      Latest Ref Rng & Units 09/26/2021    2:58 PM 05/07/2021    3:12 PM 08/10/2020   10:03 AM 07/19/2019    3:20 PM 02/24/2017   11:58 AM  PFT Results  FVC-Pre L 2.38  2.53  2.57  2.74  2.58   FVC-Predicted Pre % 79  84  84  93  85   FVC-Post L    2.63  2.45   FVC-Predicted Post %    89  81   Pre FEV1/FVC % % 87  86  86  86  82   Post FEV1/FCV % %    90  87   FEV1-Pre L 2.06  2.17  2.22  2.36  2.12   FEV1-Predicted Pre % 92  97  97  107  93   FEV1-Post L    2.35  2.14   DLCO uncorrected ml/min/mmHg 9.76  15.50  15.83  16.52  16.15   DLCO UNC% % 47  74  76  81  59   DLCO corrected ml/min/mmHg 9.48  15.50  15.34   14.93   DLCO COR %Predicted % 45  74  73   55   DLVA Predicted % 77  105  97  104  79   TLC L   4.50  3.97    TLC % Predicted %   81  74    RV % Predicted %   71  52

## 2023-06-24 ENCOUNTER — Ambulatory Visit (HOSPITAL_BASED_OUTPATIENT_CLINIC_OR_DEPARTMENT_OTHER): Payer: Medicare Other | Admitting: Pulmonary Disease

## 2023-06-24 ENCOUNTER — Encounter (HOSPITAL_BASED_OUTPATIENT_CLINIC_OR_DEPARTMENT_OTHER): Payer: Self-pay | Admitting: Pulmonary Disease

## 2023-06-24 VITALS — BP 104/60 | HR 65 | Resp 14 | Ht 67.0 in | Wt 261.0 lb

## 2023-06-24 DIAGNOSIS — R42 Dizziness and giddiness: Secondary | ICD-10-CM

## 2023-06-24 DIAGNOSIS — J9611 Chronic respiratory failure with hypoxia: Secondary | ICD-10-CM

## 2023-06-24 DIAGNOSIS — J209 Acute bronchitis, unspecified: Secondary | ICD-10-CM | POA: Diagnosis not present

## 2023-06-24 DIAGNOSIS — J84112 Idiopathic pulmonary fibrosis: Secondary | ICD-10-CM | POA: Diagnosis not present

## 2023-06-24 DIAGNOSIS — G4733 Obstructive sleep apnea (adult) (pediatric): Secondary | ICD-10-CM

## 2023-06-24 MED ORDER — FLUTICASONE PROPIONATE 50 MCG/ACT NA SUSP: 1.0000 | Freq: Every day | NASAL | 2 refills | Status: DC

## 2023-06-24 MED ORDER — DOXYCYCLINE HYCLATE 100 MG PO TABS
100.0000 mg | ORAL_TABLET | Freq: Two times a day (BID) | ORAL | 0 refills | Status: DC
Start: 2023-06-24 — End: 2023-07-16

## 2023-06-24 MED ORDER — IPRATROPIUM BROMIDE 0.06 % NA SOLN: 2.0000 | Freq: Four times a day (QID) | NASAL | 12 refills | Status: AC | PRN

## 2023-06-24 NOTE — Progress Notes (Signed)
[facility]  Name: [name] [name] MRN: [id number] DOB: [birth year]  Chief Complaint  Patient presents with   Cough    The coughing started to increase  about 2 weeks ago , notice a yellowish mucus , pt had a fall and have pain right side    Summary: 82 yr old female followed by Dr. Marchelle Gearing for pulmonary fibrosis, alpha 1 antitrypsin deficiency carrier, chronic hypoxic respiratory failure and obstructive sleep apnea.  Subjective: She fell about two week ago.  Went to the ER.  CXR didn't show any fracture.  She developed sinus congestion and ear congestion.  Has been getting vertigo.  Needs to use a walker now.  Has more cough.  Bringing up yellow sputum.  Doesn't think she had fever.  Denies hemoptysis.  Chest and back are sore from coughing.    Past medical history: She  has a past medical history of Alpha-1-antitrypsin deficiency carrier, Arthritis, Atherosclerosis of aorta (HCC), Atrial fibrillation (HCC), Back pain, Complication of anesthesia, Depression, Diastolic dysfunction, Eating disorder, Frequent UTI, GERD (gastroesophageal reflux disease), Heart disease, Hypertension, Hypertriglyceridemia, Insulin resistance, Long term current use of anticoagulant, Multiple lung nodules, Muscular deconditioning, MVP (mitral valve prolapse), Obesity, OSA on CPAP, Osteoarthritis, Osteopenia, Pacemaker, PAF (paroxysmal atrial fibrillation) (HCC), Positive ANA (antinuclear antibody), Presbyesophagus, Presence of permanent cardiac pacemaker, Primary osteoarthritis of both hands, Pulmonary fibrosis (HCC), PVC's (premature ventricular contractions), Shortness of breath, Sinus arrest (10/2014), Sleep apnea, Stroke (HCC) (01/2014), Tachy-brady syndrome (HCC) (10/2014), TIA (transient ischemic attack), and Vitamin D deficiency.  Vital signs: BP 104/60   Pulse 65   Resp 14   Ht 5\' 7"  (1.702 m)   Wt 261 lb (118.4 kg)   LMP  (LMP Unknown)   SpO2 98%   BMI 40.88 kg/m   Physical  exam:  General - alert ENT - no sinus tenderness, no stridor Cardiac - regular rate/rhythm, no murmur Chest - scattered rhonchi, basilar crackles Extremities - no cyanosis, clubbing, or edema Skin - no rashes Psych - normal mood and behavior  Studies: Echo 12/12/22 >> EF 60 to 65%, grade 1 DD, RVSP 8 mmHg, mild MR HRCT chest 05/29/22 >> probable UIP with slight progression PFT 09/26/21 >> FEV1 2.06 (92%), FEV1% 87, DLCO 47% PSG 11/30/15 >> AHI 18.2, SpO2 low 85%  Assessment/plan:  Upper respiratory infection with rhino-sinusitis, acute bronchitis, and vertigo. - discussed concerns about using meclizine and risk of falls - will have her use flonase nightly for 2 weeks, then prn - prn atrovent nasal spray - doxycycline 100 mg bid for 7 days  Idiopathic pulmonary fibrosis. - intolerant of antifibrotic therapies due to GI side effects - followed by Dr. Marchelle Gearing  Chronic respiratory failure with hypoxia. - discussed importance of using supplemental oxygen proactively and her goal SpO2 should be above 90%  Obstructive sleep apnea. - she is compliant with CPAP and reports benefit from therapy  Atrial fibrillation, Sick sinus syndrome s/p PM, Chronic HFpEF. - followed by Dr. Royann Shivers and Dr. Elberta Fortis with cardiology  Patient Instructions  Doxycycline antibiotic twice daily for 7 days.  Flonase 1 nasal spray in each nostril nightly for two weeks, then as needed for sinus congestion.  Atrovent nasal spray as needed for runny nose.  Follow up in 3 months with Dr. Marchelle Gearing.  Allergies as of 06/24/2023       Reactions   Cefazolin Hives, Itching   Other reaction(s): severe hives   Pseudoephedrine Hcl    Other reaction(s): palpitations (moderate)  Ergotamine    unknown   Other Other (See Comments)   Pentobarbital    unknown   Pirfenidone    unknown   Caffeine Palpitations        Medication List        Accurate as of June 24, 2023  3:31 PM. If you have any  questions, ask your nurse or doctor.          apixaban 5 MG Tabs tablet Commonly known as: ELIQUIS Take 1 tablet (5 mg total) by mouth 2 (two) times daily.   benzonatate 100 MG capsule Commonly known as: TESSALON Take 1 capsule (100 mg total) by mouth every 6 (six) hours as needed for cough.   diazepam 5 MG tablet Commonly known as: VALIUM TAKE 1 TABLET ONCE A DAY AS NEEDED.   dofetilide 500 MCG capsule Commonly known as: TIKOSYN Take 1 capsule (500 mcg total) by mouth 2 (two) times daily.   doxycycline 100 MG tablet Commonly known as: VIBRA-TABS Take 1 tablet (100 mg total) by mouth 2 (two) times daily. Started by: Coralyn Helling   fluticasone 50 MCG/ACT nasal spray Commonly known as: FLONASE Place 1 spray into both nostrils daily. Started by: Coralyn Helling   ipratropium 0.06 % nasal spray Commonly known as: ATROVENT Place 2 sprays into both nostrils 4 (four) times daily as needed for rhinitis.   Magnesium 400 MG Caps Take 400 mg by mouth daily.   meclizine 25 MG tablet Commonly known as: ANTIVERT Take 1 tablet (25 mg total) by mouth 3 (three) times daily as needed for dizziness.   metoprolol tartrate 25 MG tablet Commonly known as: LOPRESSOR Take 0.5 tablets (12.5 mg total) by mouth 2 (two) times daily.   nitroGLYCERIN 0.4 MG SL tablet Commonly known as: NITROSTAT Place 1 tablet (0.4 mg total) under the tongue every 5 (five) minutes as needed for chest pain.   omeprazole 40 MG capsule Commonly known as: PRILOSEC Take 1 capsule by mouth daily.   sertraline 100 MG tablet Commonly known as: ZOLOFT TAKE ONE TABLET BY MOUTH ONCE DAILY.   VITAMIN D3 PO Take 1 tablet by mouth daily.        Time spent: 37 minutes  Signature: Coralyn Helling, MD [facility] Pager - [phone] 06/24/2023, 3:35 PM

## 2023-06-24 NOTE — Patient Instructions (Signed)
Doxycycline antibiotic twice daily for 7 days.  Flonase 1 nasal spray in each nostril nightly for two weeks, then as needed for sinus congestion.  Atrovent nasal spray as needed for runny nose.  Follow up in 3 months with Dr. Marchelle Gearing.

## 2023-06-25 NOTE — Telephone Encounter (Signed)
Patient has follow up with Dr. Marchelle Gearing scheduled 07/22/23 and PFT 09/04/23.  Nothing further at this time.

## 2023-06-30 DIAGNOSIS — R2681 Unsteadiness on feet: Secondary | ICD-10-CM | POA: Diagnosis not present

## 2023-06-30 DIAGNOSIS — M545 Low back pain, unspecified: Secondary | ICD-10-CM | POA: Diagnosis not present

## 2023-06-30 DIAGNOSIS — M6281 Muscle weakness (generalized): Secondary | ICD-10-CM | POA: Diagnosis not present

## 2023-06-30 DIAGNOSIS — R278 Other lack of coordination: Secondary | ICD-10-CM | POA: Diagnosis not present

## 2023-07-02 DIAGNOSIS — R2681 Unsteadiness on feet: Secondary | ICD-10-CM | POA: Diagnosis not present

## 2023-07-02 DIAGNOSIS — M6281 Muscle weakness (generalized): Secondary | ICD-10-CM | POA: Diagnosis not present

## 2023-07-02 DIAGNOSIS — R278 Other lack of coordination: Secondary | ICD-10-CM | POA: Diagnosis not present

## 2023-07-02 DIAGNOSIS — M545 Low back pain, unspecified: Secondary | ICD-10-CM | POA: Diagnosis not present

## 2023-07-04 DIAGNOSIS — G4733 Obstructive sleep apnea (adult) (pediatric): Secondary | ICD-10-CM | POA: Diagnosis not present

## 2023-07-05 ENCOUNTER — Other Ambulatory Visit: Payer: Self-pay | Admitting: Family Medicine

## 2023-07-05 DIAGNOSIS — Z6841 Body Mass Index (BMI) 40.0 and over, adult: Secondary | ICD-10-CM

## 2023-07-07 ENCOUNTER — Encounter: Payer: Self-pay | Admitting: *Deleted

## 2023-07-07 DIAGNOSIS — R2681 Unsteadiness on feet: Secondary | ICD-10-CM | POA: Diagnosis not present

## 2023-07-07 DIAGNOSIS — M545 Low back pain, unspecified: Secondary | ICD-10-CM | POA: Diagnosis not present

## 2023-07-07 DIAGNOSIS — M6281 Muscle weakness (generalized): Secondary | ICD-10-CM | POA: Diagnosis not present

## 2023-07-07 DIAGNOSIS — R278 Other lack of coordination: Secondary | ICD-10-CM | POA: Diagnosis not present

## 2023-07-07 NOTE — Telephone Encounter (Signed)
Refill diazepam once.  Be sure not to mix with any opioids  [name] Covey MD [facility] at [facility]

## 2023-07-09 ENCOUNTER — Ambulatory Visit: Payer: Medicare Other

## 2023-07-09 DIAGNOSIS — R2681 Unsteadiness on feet: Secondary | ICD-10-CM | POA: Diagnosis not present

## 2023-07-09 DIAGNOSIS — M6281 Muscle weakness (generalized): Secondary | ICD-10-CM | POA: Diagnosis not present

## 2023-07-09 DIAGNOSIS — I495 Sick sinus syndrome: Secondary | ICD-10-CM

## 2023-07-09 DIAGNOSIS — M545 Low back pain, unspecified: Secondary | ICD-10-CM | POA: Diagnosis not present

## 2023-07-09 DIAGNOSIS — R278 Other lack of coordination: Secondary | ICD-10-CM | POA: Diagnosis not present

## 2023-07-14 DIAGNOSIS — R2681 Unsteadiness on feet: Secondary | ICD-10-CM | POA: Diagnosis not present

## 2023-07-14 DIAGNOSIS — M545 Low back pain, unspecified: Secondary | ICD-10-CM | POA: Diagnosis not present

## 2023-07-14 DIAGNOSIS — M6281 Muscle weakness (generalized): Secondary | ICD-10-CM | POA: Diagnosis not present

## 2023-07-14 DIAGNOSIS — R278 Other lack of coordination: Secondary | ICD-10-CM | POA: Diagnosis not present

## 2023-07-16 ENCOUNTER — Ambulatory Visit (HOSPITAL_COMMUNITY)
Admission: RE | Admit: 2023-07-16 | Discharge: 2023-07-16 | Disposition: A | Discharge status: Home / Self Care | Payer: Medicare Other | Source: Ambulatory Visit | Attending: Physician Assistant | Admitting: Physician Assistant

## 2023-07-16 ENCOUNTER — Encounter (HOSPITAL_COMMUNITY): Payer: Self-pay | Admitting: Physician Assistant

## 2023-07-16 VITALS — BP 118/60 | HR 66 | Ht 67.0 in | Wt 262.0 lb

## 2023-07-16 DIAGNOSIS — M545 Low back pain, unspecified: Secondary | ICD-10-CM | POA: Diagnosis not present

## 2023-07-16 DIAGNOSIS — R9431 Abnormal electrocardiogram [ECG] [EKG]: Secondary | ICD-10-CM | POA: Insufficient documentation

## 2023-07-16 DIAGNOSIS — G4733 Obstructive sleep apnea (adult) (pediatric): Secondary | ICD-10-CM | POA: Diagnosis not present

## 2023-07-16 DIAGNOSIS — Z7901 Long term (current) use of anticoagulants: Secondary | ICD-10-CM | POA: Diagnosis not present

## 2023-07-16 DIAGNOSIS — I480 Paroxysmal atrial fibrillation: Secondary | ICD-10-CM | POA: Insufficient documentation

## 2023-07-16 DIAGNOSIS — R278 Other lack of coordination: Secondary | ICD-10-CM | POA: Diagnosis not present

## 2023-07-16 DIAGNOSIS — R2681 Unsteadiness on feet: Secondary | ICD-10-CM | POA: Diagnosis not present

## 2023-07-16 DIAGNOSIS — M6281 Muscle weakness (generalized): Secondary | ICD-10-CM | POA: Diagnosis not present

## 2023-07-16 DIAGNOSIS — Z79899 Other long term (current) drug therapy: Secondary | ICD-10-CM | POA: Diagnosis not present

## 2023-07-16 DIAGNOSIS — J84112 Idiopathic pulmonary fibrosis: Secondary | ICD-10-CM | POA: Insufficient documentation

## 2023-07-16 DIAGNOSIS — E669 Obesity, unspecified: Secondary | ICD-10-CM | POA: Diagnosis not present

## 2023-07-16 DIAGNOSIS — Z5181 Encounter for therapeutic drug level monitoring: Secondary | ICD-10-CM | POA: Diagnosis not present

## 2023-07-16 DIAGNOSIS — I495 Sick sinus syndrome: Secondary | ICD-10-CM | POA: Diagnosis not present

## 2023-07-16 DIAGNOSIS — D6869 Other thrombophilia: Secondary | ICD-10-CM | POA: Diagnosis not present

## 2023-07-16 DIAGNOSIS — I5032 Chronic diastolic (congestive) heart failure: Secondary | ICD-10-CM | POA: Diagnosis not present

## 2023-07-16 DIAGNOSIS — Z6841 Body Mass Index (BMI) 40.0 and over, adult: Secondary | ICD-10-CM | POA: Insufficient documentation

## 2023-07-16 NOTE — Progress Notes (Signed)
Primary Care Physician: Kristian Covey, MD Primary Cardiologist: Dr Royann Shivers Primary Electrophysiologist: Dr Elberta Fortis Referring Physician: Dr Virginia Crews is a 82 y.o. female with a history of tachybradycardia syndrome s/p PPM, diastolic CHF, MVP, OSA, idiopathic pulmonary fibrosis, CVA, aortic atherosclerosis, atrial fibrillation who presents for follow up in the [facility]. Patient is on Eliquis for a CHADS2VASC score of 8. Her afib burden was noted to be increasing on her device and she was referred to EP for evaluation. She was not felt to be a good ablation candidate and dofetilide was recommended. She is s/p dofetilide admission 4/4-03/08/22.  On follow up today, patient reports that she has done reasonably well since her last visit. There has been no change in her chronic fatigue. Last PPM interrogation on 8/7 showed no high ventricular rate alerts or atrial mode switches. No bleeding issues on anticoagulation.   Today, she denies symptoms of palpitations, chest pain,  orthopnea, PND, lower extremity edema, dizziness, presyncope, syncope, bleeding, or neurologic sequela. The patient is tolerating medications without difficulties and is otherwise without complaint today.    Atrial Fibrillation Risk Factors:  she does have symptoms or diagnosis of sleep apnea. she is compliant with CPAP therapy. she does not have a history of rheumatic fever.   Atrial Fibrillation Management history:  Previous antiarrhythmic drugs: dofetilide  Previous cardioversions: 2018 Previous ablations: none Anticoagulation history: Eliquis   Past Medical History:  Diagnosis Date   Alpha-1-antitrypsin deficiency carrier    Arthritis    "all over"   Atherosclerosis of aorta (HCC)    Atrial fibrillation (HCC)    Back pain    Complication of anesthesia    "I woke up during 2 different procedures" (10/04/2014)   Depression    Diastolic dysfunction    Eating  disorder    Frequent UTI    GERD (gastroesophageal reflux disease)    Heart disease    Hypertension    Hypertriglyceridemia    Insulin resistance    Long term current use of anticoagulant    Multiple lung nodules    Muscular deconditioning    MVP (mitral valve prolapse)    Obesity    OSA on CPAP    Osteoarthritis    Osteopenia    unspecified location   Pacemaker    PAF (paroxysmal atrial fibrillation) (HCC)    Positive ANA (antinuclear antibody)    Presbyesophagus    Presence of permanent cardiac pacemaker    Primary osteoarthritis of both hands    neck and spine and hips   Pulmonary fibrosis (HCC)    followed by pulmonolgy   PVC's (premature ventricular contractions)    Shortness of breath    Sinus arrest 10/2014   s/p Medtronic Advisa model A2DR01 serial number [id number] H   Sleep apnea    Stroke (HCC) 01/2014   denies deficits on 10/04/2014   Tachy-brady syndrome (HCC) 10/2014   TIA (transient ischemic attack)    Vitamin D deficiency     Current Outpatient Medications  Medication Sig Dispense Refill   apixaban (ELIQUIS) 5 MG TABS tablet Take 1 tablet (5 mg total) by mouth 2 (two) times daily. 28 tablet 0   benzonatate (TESSALON) 100 MG capsule Take 1 capsule (100 mg total) by mouth every 6 (six) hours as needed for cough. 30 capsule 1   Cholecalciferol (VITAMIN D3 PO) Take 1 tablet by mouth daily.     diazepam (VALIUM) 5 MG tablet TAKE  1 TABLET ONCE A DAY AS NEEDED. 30 tablet 0   dofetilide (TIKOSYN) 500 MCG capsule Take 1 capsule (500 mcg total) by mouth 2 (two) times daily. 180 capsule 1   fluticasone (FLONASE) 50 MCG/ACT nasal spray Place 1 spray into both nostrils daily. 16 g 2   ipratropium (ATROVENT) 0.06 % nasal spray Place 2 sprays into both nostrils 4 (four) times daily as needed for rhinitis. 15 mL 12   Magnesium 400 MG CAPS Take 400 mg by mouth daily. 30 capsule 0   meclizine (ANTIVERT) 25 MG tablet Take 1 tablet (25 mg total) by mouth 3 (three) times daily  as needed for dizziness. 30 tablet 0   metoprolol tartrate (LOPRESSOR) 25 MG tablet Take 0.5 tablets (12.5 mg total) by mouth 2 (two) times daily. 90 tablet 3   nitroGLYCERIN (NITROSTAT) 0.4 MG SL tablet Place 1 tablet (0.4 mg total) under the tongue every 5 (five) minutes as needed for chest pain. 25 tablet 1   omeprazole (PRILOSEC) 40 MG capsule Take 1 capsule by mouth daily.     sertraline (ZOLOFT) 100 MG tablet TAKE ONE TABLET BY MOUTH ONCE DAILY. 90 tablet 3   No current facility-administered medications for this encounter.    ROS- All systems are reviewed and negative except as per the HPI above.  Physical Exam: Vitals:   07/16/23 1518  BP: 118/60  Pulse: 66  Weight: 118.8 kg  Height: 5\' 7"  (1.702 m)     GEN: Well nourished, well developed in no acute distress NECK: No JVD; No carotid bruits CARDIAC: Regular rate and rhythm, no murmurs, rubs, gallops RESPIRATORY:  Clear to auscultation without rales, wheezing or rhonchi  ABDOMEN: Soft, non-tender, non-distended EXTREMITIES:  No edema; No deformity    Wt Readings from Last 3 Encounters:  07/16/23 118.8 kg  06/24/23 118.4 kg  06/11/23 118.4 kg    EKG today demonstrates  A paced rhythm, PAC Vent. rate 66 BPM PR interval 216 ms QRS duration 74 ms QT/QTcB 436/457 ms  Echo 12/12/22 demonstrated   1. Left ventricular ejection fraction, by estimation, is 60 to 65%. The  left ventricle has normal function. The left ventricle has no regional  wall motion abnormalities. Left ventricular diastolic parameters are  consistent with Grade I diastolic dysfunction (impaired relaxation).   2. Right ventricular systolic function is normal. The right ventricular  size is normal. There is normal pulmonary artery systolic pressure. The  estimated right ventricular systolic pressure is 8.0 mmHg.   3. The mitral valve is degenerative. Mild mitral valve regurgitation. No  evidence of mitral stenosis.   4. The aortic valve is tricuspid.  Aortic valve regurgitation is not  visualized. Aortic valve sclerosis is present, with no evidence of aortic  valve stenosis.   5. The inferior vena cava is normal in size with greater than 50%  respiratory variability, suggesting right atrial pressure of 3 mmHg.    Epic records are reviewed at length today  CHA2DS2-VASc Score = 8  The patient's score is based upon: CHF History: 1 HTN History: 1 Diabetes History: 0 Stroke History: 2 Vascular Disease History: 1 Age Score: 2 Gender Score: 1        ASSESSMENT AND PLAN: Paroxysmal Atrial Fibrillation (ICD10:  I48.0) The patient's CHA2DS2-VASc score is 8, indicating a 10.8% annual risk of stroke.   S/p dofetilide admission 03/2022 Patient appears to be maintaining SR, continue to monitor burden on PPM. Continue Eliquis 5 mg BID  Continue dofetilide 500 mcg  BID, QT stable Recent lab work reviewed Continue Lopressor 12.5 mg BID  Secondary Hypercoagulable State (ICD10:  D68.69) The patient is at significant risk for stroke/thromboembolism based upon her CHA2DS2-VASc Score of 8.  Continue Apixaban (Eliquis).   Obesity Body mass index is 41.04 kg/m.  Encouraged lifestyle modification  OSA  Encouraged nightly CPAP  SSS/tachybradycardia syndrome S/p PPM, followed by Dr Royann Shivers and the device clinic  IPF Followed by Dr Marchelle Gearing Avoiding amiodarone.    Follow up with Dr Royann Shivers as scheduled. AF clinic in 6 months.    Jorja Loa PA-C Af[facility] [address] [place], [place] [address] [phone]

## 2023-07-17 ENCOUNTER — Encounter: Payer: Self-pay | Admitting: Cardiovascular Disease

## 2023-07-17 ENCOUNTER — Ambulatory Visit: Payer: Medicare Other | Attending: Cardiovascular Disease | Admitting: Cardiovascular Disease

## 2023-07-17 VITALS — BP 116/68 | HR 82 | Ht 67.0 in | Wt 261.2 lb

## 2023-07-17 DIAGNOSIS — Z950 Presence of cardiac pacemaker: Secondary | ICD-10-CM | POA: Diagnosis not present

## 2023-07-17 DIAGNOSIS — D6869 Other thrombophilia: Secondary | ICD-10-CM

## 2023-07-17 DIAGNOSIS — J84112 Idiopathic pulmonary fibrosis: Secondary | ICD-10-CM

## 2023-07-17 DIAGNOSIS — Z5181 Encounter for therapeutic drug level monitoring: Secondary | ICD-10-CM

## 2023-07-17 DIAGNOSIS — I5032 Chronic diastolic (congestive) heart failure: Secondary | ICD-10-CM

## 2023-07-17 DIAGNOSIS — I480 Paroxysmal atrial fibrillation: Secondary | ICD-10-CM

## 2023-07-17 DIAGNOSIS — I2721 Secondary pulmonary arterial hypertension: Secondary | ICD-10-CM | POA: Diagnosis not present

## 2023-07-17 DIAGNOSIS — J9611 Chronic respiratory failure with hypoxia: Secondary | ICD-10-CM

## 2023-07-17 DIAGNOSIS — I495 Sick sinus syndrome: Secondary | ICD-10-CM

## 2023-07-17 DIAGNOSIS — Z79899 Other long term (current) drug therapy: Secondary | ICD-10-CM

## 2023-07-17 DIAGNOSIS — G4733 Obstructive sleep apnea (adult) (pediatric): Secondary | ICD-10-CM

## 2023-07-17 NOTE — Patient Instructions (Signed)
Medication Instructions:  No changes *If you need a refill on your cardiac medications before your next appointment, please call your pharmacy*  Follow-Up: At [facility], you and your health needs are our priority.  As part of our continuing mission to provide you with exceptional heart care, we have created designated Provider Care Teams.  These Care Teams include your primary Cardiologist (physician) and Advanced Practice Providers (APPs -  Physician Assistants and Nurse Practitioners) who all work together to provide you with the care you need, when you need it.  We recommend signing up for the patient portal called "MyChart".  Sign up information is provided on this After Visit Summary.  MyChart is used to connect with patients for Virtual Visits (Telemedicine).  Patients are able to view lab/test results, encounter notes, upcoming appointments, etc.  Non-urgent messages can be sent to your provider as well.   To learn more about what you can do with MyChart, go to [email or web address].    Your next appointment:   9 month(s)  Provider:   Thurmon Fair, MD

## 2023-07-17 NOTE — Progress Notes (Signed)
Cardiology Office Note    Date:  07/17/2023   ID:  TASKA MERCURI, DOB Jul 11, 1941, MRN [id number]  PCP:  Kristian Covey, MD  Cardiologist:   Thurmon Fair, MD   Chief Complaint  Patient presents with   Pacemaker Check     History of Present Illness:  [name] [name] is a 82 y.o. 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  She has done pretty well since her last appointment.  She has not had any cardiovascular events.  She is not aware of palpitations but her physicians have told her that her rhythm is at times irregular since her last appointment.  Her biggest problems remains exertional dyspnea, NYHA functional class III.  Today at rest oxygen saturation is normal at 95%.  She does not have orthopnea or PND or any change in her mild ankle edema, usually only present toward the end of the day.  She is not taking diuretics.  Since starting treatment with dofetilide in April 2023 she has an extremely low burden of atrial fibrillation.  She has not had any true atrial fibrillation in the last 12 months.  Overall in the last year she has had a cumulative burden of atrial mode switch of less than 2 minutes.  She has 95.3% atrial pacing and only 0.2% ventricular pacing.  Activity level is constant at about 1 hour a day.  All lead parameters look excellent.  Generator longevity is estimated at 2.0 years.    She had normal renal function and potassium levels on labs checked 05/24/2022.  She has interstitial lung disease (CT appearance consistent with usual interstitial pneumonia) and has a recent high-resolution CT that showed "slight interval worsening".  She is on treatment with Ofev and  is followed by Dr. Marchelle Gearing.  She was reluctant to undergo sedation for conventional colonoscopy and had a virtual colonoscopy that showed only diverticulosis, no masses.  Incidental note was made of atherosclerosis in the abdominal aorta.  She also had a barium swallow that showed a stricture at the gastroesophageal junction where the 13 mm barium tablet could not pass.  She has evidence of atherosclerosis of the aorta. She does not have angina pectoris. Nuclear stress test in January 2018 was normal/low risk. Her echocardiogram in April 2015 showed evidence of diastolic dysfunction without elevated filling pressures. Similar findings on echo in September 2017 with EF 50-55% and grade 1 diastolic dysfunction without elevated filling pressures. She has not had clinically evident congestive heart failure in the past. 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  She sees Dr. 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 She has a long-standing history of obstructive sleep apnea but uses CPAP. 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  She has a lifelong struggle with obesity and has prediabetes.  She is enrolled in Dr. Francena Hanly bariatric clinic  Past Medical History:  Diagnosis Date   Alpha-1-antitrypsin deficiency carrier    Arthritis    "all  over"   Atherosclerosis of aorta [facility])    Atrial fibrillation (HCC)    Back pain    Complication of anesthesia    "I woke up during 2 different procedures" (10/04/2014)   Depression    Diastolic dysfunction    Eating disorder    Frequent UTI    GERD  (gastroesophageal reflux disease)    Heart disease    Hypertension    Hypertriglyceridemia    Insulin resistance    Long term current use of anticoagulant    Multiple lung nodules    Muscular deconditioning    MVP (mitral valve prolapse)    Obesity    OSA on CPAP    Osteoarthritis    Osteopenia    unspecified location   Pacemaker    PAF (paroxysmal atrial fibrillation) (HCC)    Positive ANA (antinuclear antibody)    Presbyesophagus    Presence of permanent cardiac pacemaker    Primary osteoarthritis of both hands    neck and spine and hips   Pulmonary fibrosis (HCC)    followed by pulmonolgy   PVC's (premature ventricular contractions)    Shortness of breath    Sinus arrest 10/2014   s/p Medtronic Advisa model A2DR01 serial number [id number]   Sleep apnea    Stroke (HCC) 01/2014   denies deficits on 10/04/2014   Tachy-brady syndrome (HCC) 10/2014   TIA (transient ischemic attack)    Vitamin D deficiency     Past Surgical History:  Procedure Laterality Date   CARDIOVERSION N/A 09/02/2017   Procedure: CARDIOVERSION;  Surgeon: Thurmon Fair, MD;  Location: [facility] ENDOS[facility];  Service: Cardiovascular;  Laterality: N/A;   COLONOSCOPY  2019   ESOPHAGEAL DILATION     EXCISION VAGINAL CYST     benign nodules   INSERT / REPLACE / REMOVE PACEMAKER  10/04/2014   Medtronic Advisa model A2DR01 serial number [id number] H   LOOP RECORDER EXPLANT N/A 10/04/2014   Procedure: LOOP RECORDER EXPLANT;  Surgeon: Thurmon Fair, MD;  Location: [facility] CATH LAB;  Service: Cardiovascular;  Laterality: N/A;   LOOP RECORDER IMPLANT N/A 04/19/2014   Procedure: LOOP RECORDER IMPLANT;  Surgeon: Thurmon Fair, MD;  Location: [facility] CATH LAB;  Service: Cardiovascular;  Laterality: N/A;   NM MYOCAR PERF WALL MOTION  12/18/2007   normal   PERMANENT PACEMAKER INSERTION N/A 10/04/2014   Procedure: PERMANENT PACEMAKER INSERTION;  Surgeon: Thurmon Fair, MD;  Location: [facility] CATH LAB;  Service: Cardiovascular;  Laterality:  N/A;   TUBAL LIGATION     US ECHOCARDIOGRAPHY  05/15/2010   LA mildly dilated,mild mitral annular ca+, AOV mildly sclerotic, mild asymmetric LVH    Current Medications: Outpatient Medications Prior to Visit  Medication Sig Dispense Refill   apixaban (ELIQUIS) 5 MG TABS tablet Take 1 tablet (5 mg total) by mouth 2 (two) times daily. 28 tablet 0   Cholecalciferol (VITAMIN D3 PO) Take 1 tablet by mouth daily.     diazepam (VALIUM) 5 MG tablet TAKE 1 TABLET ONCE A DAY AS NEEDED. 30 tablet 0   dofetilide (TIKOSYN) 500 MCG capsule Take 1 capsule (500 mcg total) by mouth 2 (two) times daily. 180 capsule 1   fluticasone (FLONASE) 50 MCG/ACT nasal spray Place 1 spray into both nostrils daily. 16 g 2   Magnesium 400 MG CAPS Take 400 mg by mouth daily. 30 capsule 0   metoprolol tartrate (LOPRESSOR) 25 MG tablet Take 0.5 tablets (12.5 mg total) by mouth 2 (two) times daily. 90 tablet 3  omeprazole (PRILOSEC) 40 MG capsule Take 1 capsule by mouth daily.     sertraline (ZOLOFT) 100 MG tablet TAKE ONE TABLET BY MOUTH ONCE DAILY. 90 tablet 3   benzonatate (TESSALON) 100 MG capsule Take 1 capsule (100 mg total) by mouth every 6 (six) hours as needed for cough. (Patient not taking: Reported on 07/17/2023) 30 capsule 1   ipratropium (ATROVENT) 0.06 % nasal spray Place 2 sprays into both nostrils 4 (four) times daily as needed for rhinitis. (Patient not taking: Reported on 07/17/2023) 15 mL 12   meclizine (ANTIVERT) 25 MG tablet Take 1 tablet (25 mg total) by mouth 3 (three) times daily as needed for dizziness. (Patient not taking: Reported on 07/17/2023) 30 tablet 0   nitroGLYCERIN (NITROSTAT) 0.4 MG SL tablet Place 1 tablet (0.4 mg total) under the tongue every 5 (five) minutes as needed for chest pain. (Patient not taking: Reported on 07/17/2023) 25 tablet 1   No facility-administered medications prior to visit.     Allergies:   Cefazolin, Pseudoephedrine hcl, Ergotamine, Other, Pentobarbital, Pirfenidone, and  Caffeine   Social History   Socioeconomic History   Marital status: Divorced    Spouse name: Not on file   Number of children: 3   Years of education: college   Highest education level: Not on file  Occupational History   Occupation: Retired Scientist, research (physical sciences)  Tobacco Use   Smoking status: Former    Current packs/day: 0.00    Average packs/day: 2.0 packs/day for 18.0 years (36.0 ttl pk-yrs)    Types: Cigarettes    Start date: 07/18/1952    Quit date: 07/18/1970    Years since quitting: 53.0   Smokeless tobacco: Never   Tobacco comments:    Former smoker 07/18/22 quit smoking in 1971  Vaping Use   Vaping status: Never Used  Substance and Sexual Activity   Alcohol use: Yes    Alcohol/week: 0.0 standard drinks of alcohol    Comment: 10/04/2014 "might have a drink a couple times/yr"   Drug use: No   Sexual activity: Never  Other Topics Concern   Not on file  Social History Narrative   Lives at [facility]    Patient is right handed.   Patient has a college degree.   Patient is retired.      Social History      Diet? Awful sweets are a terrible addiction       Do you drink/eat things with caffeine? some      Marital status?          divorced                          What year were you married? 1960      Do you live in a house, apartment, assisted living, condo, trailer, etc.? [facility]       Is it one or more stories? 1st level      How many persons live in your home? 1      Do you have any pets in your home? (please list) no       Highest level of education completed? College graduate      Current or past profession: realtor       Do you exercise?             Not much  Type & how often? Barely any  - bed exercise for injured knee      Advanced Directives      Do you have a living will? yes      Do you have a DNR form?                                  If not, do you want to discuss one?yes      Do you have signed POA/HPOA for  forms? yes      Functional Status      Do you have difficulty bathing or dressing yourself? No       Do you have difficulty preparing food or eating? No       Do you have difficulty managing your medications? No       Do you have difficulty managing your finances? No       Do you have difficulty affording your medications? No  Except eloquist after donut hole - so far I have been able to get samples from cardiologist      Social Determinants of Health   Financial Resource Strain: Low Risk  (01/22/2023)   Overall Financial Resource Strain (CARDIA)    Difficulty of Paying Living Expenses: Not hard at all  Food Insecurity: No Food Insecurity (01/22/2023)   Hunger Vital Sign    Worried About Running Out of Food in the Last Year: Never true    Ran Out of Food in the Last Year: Never true  Transportation Needs: No Transportation Needs (01/22/2023)   PRAPARE - Administrator, Civil Service (Medical): No    Lack of Transportation (Non-Medical): No  Physical Activity: Sufficiently Active (01/22/2023)   Exercise Vital Sign    Days of Exercise per Week: 4 days    Minutes of Exercise per Session: 40 min  Recent Concern: Physical Activity - Insufficiently Active (01/22/2023)   Exercise Vital Sign    Days of Exercise per Week: 3 days    Minutes of Exercise per Session: 40 min  Stress: No Stress Concern Present (01/22/2023)   [employer] of Occupational Health - Occupational Stress Questionnaire    Feeling of Stress : Not at all  Social Connections: Unknown (01/22/2023)   Social Connection and Isolation Panel [NHANES]    Frequency of Communication with Friends and Family: More than three times a week    Frequency of Social Gatherings with Friends and Family: More than three times a week    Attends Religious Services: More than 4 times per year    Active Member of [employer] or Organizations: Yes    Attends Engineer, structural: More than 4 times per year    Marital  Status: Patient declined     Family History:  The patient's family history includes Alcoholism in her brother; Alpha-1 antitrypsin deficiency in her daughter; Arthritis in her son; Breast cancer in her daughter; Diabetes in her son; Healthy in her brother, daughter, and sister; Heart attack in her mother; Heart failure in her father; Prostate cancer in her brother, brother, and brother; Stroke in her brother, brother, and father; Sudden death in her mother.   ROS:   Please see the history of present illness.    All other systems are reviewed and are negative.   PHYSICAL EXAM:   VS:  BP 116/68 (BP Location: Left Arm, Patient Position: Sitting, Cuff Size: Large)   Pulse  82   Ht 5\' 7"  (1.702 m)   Wt 261 lb 3.2 oz (118.5 kg)   LMP  (LMP Unknown)   SpO2 93%   BMI 40.91 kg/m      General: Alert, oriented x3, no distress, morbidly obese.  Healthy left subclavian pacemaker site Head: no evidence of trauma, PERRL, EOMI, no exophtalmos or lid lag, no myxedema, no xanthelasma; normal ears, nose and oropharynx Neck: normal jugular venous pulsations and no hepatojugular reflux; brisk carotid pulses without delay and no carotid bruits Chest: clear to auscultation, no signs of consolidation by percussion or palpation, normal fremitus, symmetrical and full respiratory excursions Cardiovascular: normal position and quality of the apical impulse, regular rhythm, normal first and second heart sounds, no murmurs, rubs or gallops Abdomen: no tenderness or distention, no masses by palpation, no abnormal pulsatility or arterial bruits, normal bowel sounds, no hepatosplenomegaly Extremities: no clubbing, cyanosis or edema; 2+ radial, ulnar and brachial pulses bilaterally; 2+ right femoral, posterior tibial and dorsalis pedis pulses; 2+ left femoral, posterior tibial and dorsalis pedis pulses; no subclavian or femoral bruits Neurological: grossly nonfocal Psych: Normal mood and affect     Wt Readings from  Last 3 Encounters:  07/17/23 261 lb 3.2 oz (118.5 kg)  07/16/23 262 lb (118.8 kg)  06/24/23 261 lb (118.4 kg)      Studies/Labs Reviewed:   ECHO 12/12/2022   1. Left ventricular ejection fraction, by estimation, is 60 to 65%. The  left ventricle has normal function. The left ventricle has no regional  wall motion abnormalities. Left ventricular diastolic parameters are  consistent with Grade I diastolic dysfunction (impaired relaxation).   2. Right ventricular systolic function is normal. The right ventricular  size is normal. There is normal pulmonary artery systolic pressure. The  estimated right ventricular systolic pressure is 8.0 mmHg.   3. The mitral valve is degenerative. Mild mitral valve regurgitation. No  evidence of mitral stenosis.   4. The aortic valve is tricuspid. Aortic valve regurgitation is not  visualized. Aortic valve sclerosis is present, with no evidence of aortic  valve stenosis.   5. The inferior vena cava is normal in size with greater than 50%  respiratory variability, suggesting right atrial pressure of 3 mmHg.   EKG:  EKG is not ordered today.  ECG performed yesterday in the A-fib clinic shows atrial paced, ventricular sensed rhythm with a single premature atrial contraction.  QTc was in desirable range at 457 ms. Recent Labs: 06/10/2023: ALT 18; BUN 20; Creatinine, Ser 1.18; Hemoglobin 15.6; Magnesium 2.1; Platelets 238; Potassium 4.1; Sodium 137   Lipid Panel    Component Value Date/Time   CHOL 195 07/28/2020 0913   CHOL 176 06/03/2018 1022   TRIG 133 07/28/2020 0913   HDL 55 07/28/2020 0913   HDL 53 06/03/2018 1022   CHOLHDL 3.5 07/28/2020 0913   VLDL 33.6 01/15/2017 1417   LDLCALC 115 (H) 07/28/2020 0913   LDLDIRECT 81.0 07/15/2016 1419    ASSESSMENT:    1. Paroxysmal atrial fibrillation (HCC)   2. SSS (sick sinus syndrome) (HCC)   3. Pacemaker   4. PAH (pulmonary artery hypertension) (HCC)   5. Chronic diastolic heart failure (HCC)    6. Acquired thrombophilia (HCC)   7. Encounter for monitoring dofetilide therapy   8. Morbid obesity (HCC)   9. OSA (obstructive sleep apnea)   10. Fibrosis, idiopathic pulmonary (HCC)   11. Chronic respiratory failure with hypoxia (HCC)  PLAN:  In order of problems listed above:  AFib: She has not had any recurrence of atrial fibrillation in roughly 16 months since she started treatment with dofetilide.  Intracardiac electrogram today shows atrial paced, ventricular sensed rhythm.  CHADSVasc 5 (Age 42, gender, TIA).  On appropriate anticoagulation, without recent bleeding. SSS: Has close 200% atrial pacing with an appropriate heart rate histogram distribution. PPM: Normal device function.  Continue remote downloads every 3 months. PAH: She has pulmonary hypertension due to combination of WHO group 2 and group 3 etiology (diastolic heart failure and interstitial lung disease and obesity).   CHF: Clinically euvolemic, as far as I can tell. BNP normal in 2018, but elevated in May 2022 (proBNP 289) and in June 2023 (BNP 134). Echo 2017 with normal EF, mild diastolic dysfunction without evidence of elevated filling pressures.   Anticoagulation: No bleeding complications Dofetilide: QT in appropriate range. Normal potassium and creatinine levels recently.  She is aware of the need for routine monitoring of the QT interval and the possibility for multiple drug interactions with dofetilide  Obesity: She understands that this is contributing to her dyspnea and is frustrated with her inability to lose weight. OSA: Reports compliance with CPAP and denies daytime hypersomnolence. IPF: Did not tolerate treatment with pirfenidone.  Has a follow-up visit with Dr. Marchelle Gearing next week.    Medication Adjustments/Labs and Tests Ordered: Current medicines are reviewed at length with the patient today.  Concerns regarding medicines are outlined above.  Medication changes, Labs and Tests ordered  today are listed in the Patient Instructions below. Patient Instructions  Medication Instructions:  No changes *If you need a refill on your cardiac medications before your next appointment, please call your pharmacy*  Follow-Up: At [facility], you and your health needs are our priority.  As part of our continuing mission to provide you with exceptional heart care, we have created designated Provider Care Teams.  These Care Teams include your primary Cardiologist (physician) and Advanced Practice Providers (APPs -  Physician Assistants and Nurse Practitioners) who all work together to provide you with the care you need, when you need it.  We recommend signing up for the patient portal called "MyChart".  Sign up information is provided on this After Visit Summary.  MyChart is used to connect with patients for Virtual Visits (Telemedicine).  Patients are able to view lab/test results, encounter notes, upcoming appointments, etc.  Non-urgent messages can be sent to your provider as well.   To learn more about what you can do with MyChart, go to [email or web address].    Your next appointment:   9 month(s)  Provider:   Thurmon Fair, MD      Signed, Thurmon Fair, MD  07/17/2023 1:44 PM    [facility] [address] [place], [place], [place]  [address] Phone: [phone]; Fax: [phone]

## 2023-07-18 ENCOUNTER — Ambulatory Visit: Payer: Medicare Other

## 2023-07-22 ENCOUNTER — Ambulatory Visit: Payer: Medicare Other | Admitting: Internal Medicine

## 2023-07-22 ENCOUNTER — Encounter: Payer: Self-pay | Admitting: Internal Medicine

## 2023-07-22 VITALS — BP 110/70 | HR 64 | Ht 67.0 in | Wt 262.2 lb

## 2023-07-22 DIAGNOSIS — J84112 Idiopathic pulmonary fibrosis: Secondary | ICD-10-CM

## 2023-07-22 NOTE — Patient Instructions (Addendum)
IPF (idiopathic pulmonary fibrosis) (HCC)   - NOT SEEN you iin > 1 year but glad you are stable on supportive care  PLAN - respect no intersest in approved antifibrotic or trials - check spirometry/dlco in 3 months; last Oct 2022 - do HRCT supine and proine in 3 monthsl last June 2023   Followup - 4 months after  spirometry and dlco and CT - return for 15 min visith wth DR Marchelle Gearing in 4 months but after PFT  - walk test and symptoms questionnaire at followup

## 2023-07-22 NOTE — Progress Notes (Signed)
@Patient  ID: [name] [name], female    DOB: 1941/01/28, 82 y.o.   MRN: [id number]  Chief Complaint  Patient presents with   Follow-up    Pt. Says she's had some bleeding.     Referring provider: Sigmund Hazel, MD  HPI: 82 year old female former smoker quit in 1971.  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 Patient of Dr. Marchelle Gearing, last seen by pulmonary NP on 04/10/21.   Previous [facility] pulmonary encounter: 04/10/2021 follow-up; Bronchitis, Pulmonary fibrosis Patient returns for a follow-up visit, patient was seen last week for cough, congestion, shortness of breath and chills.  COVID-19 testing was negative.  Patient had been fully vaccinated for COVID-19.  She had no increased oxygen demands.  Patient was given doxycycline for 7 days and prednisone 20 mg for 5 days. Patient complains ongoing cough and congestion , mucus remains green on/off. No fever.  Eating well , no n/v.d. No hemoptysis , orthopnea or edema.  Called in over the weekend and was called in steroid burst. Currently on Prednisone 40mg  daily .  Chest xray showed chronic changes with no acute process.  Lives at [facility] [place] , drives.   04/17/2021 Patient presents today follow-up with PFTs. She did not have PFTs done today because she is still not feeling well. She completed course of doxycycline and has two days left of prednisone. Reports "cold symptoms" described as sinus drainage and cough. Sputum has not real purulence. Energy level is low. States that she feels her head is not right. She is using Flonase ans Claritin. She is short of breath. She completed doxycycline twice with no improvement. She is taking mucinex twice a day. She has not used albuterol rescue inhaler. Her ex husband died two weeks ago, she found out two days ago. Denies depressed feelings.    05/15/2021 -  Dr. Marchelle Gearing Type of  visit: Telephone/Video [name] [name] 82 y.o. - changed face to face visit to telephone visit due to > 80F heat. Wantd to know PFT resuts June 2022 copared to sept 2021. FVC down 1.6%, DLCO down 2.1% (compared to y  2years ago FVC declined 7.7%). On Esbeit since Dec 2021/Jan 2022.  Says Dr C sarted on her ditliazem 04/18/21 and after that esbriet became more intolerable. She had constipationa and retain fluids.  So, she stopped all her medications except  eliquis and anti depressant and anti histamine. This means she stopped esbriet approx a week ago. Says that for several days still not feel better though better today. She is unsure if today is a good day v bad day. But overall feels A Fib is acting up. She wants to restarrtrt esbriet after good w wash out.   She is now using o2 at night but feels is not helping energy levels at day time. I Advised she could return it and then she said is actually helping her and does not want to return it.    08/14/21- Dr. Marchelle Gearing Chief Complaint  Patient presents with   Follow-up    Pt states she has been doing okay since last visit. Still becomes SOB with exertion.     [name] [name] 82 y.o. -returns for follow-up.  Since her last visit because she was having calls with side effects from pirfenidone.  She stopped it.  She says after that she started feeling better.  Despite this she is reporting  progressive decline in dyspnea.  Dyspnea score is listed below.  She uses oxygen with exertion as needed.  I at night she uses CPAP without oxygen.  She is frustrated with pirfenidone.  She asked about the alternative nintedanib.  She recollects that there constraints with it in the setting of heart disease and anticoagulation.  Explained that one of the study showed 0.5% increase incidence and MI with nintedanib.  But not the other studies.  Explained that surgical mechanism of anticoagulation but clinically patient have tolerated.  Explained about GI side effects  particularly diarrhea.  She is nervous and hesitant but finally we took a shared decision making that she would start it at a lower dose given the fact it can be beneficial in reducing risk of progression.  We also discussed various aspects of clinical trials documented below.  She is interested in this as long as it is a medication with a much better side effect profile.  But at this point in time we took a shared decision making to go with standard of care therapy first.  She will have the flu shot today.  She is very nervous about getting mRNA COVID booster because of the side effect profile.  She had significant side effects apparently.  She also had omicron COVID in January 2022 and she is wondering about natural immunity.  However she is aware is COVID everywhere currently.  We took a shared decision making to check IgG antibody levels and adjust the risk threshold accordingly.  09/18/2021- Interim hx  Patient presents today for acute visit for medication reaction to OFEV. Patient called our office yesterday with reports of rectal bleeding and abdominal pain since starting 09/17/21. She is newly on OFEV since 10/13 and is also on Eliquis. Our office advised her to stop OFEV. Rectal bleeding has since resolved, she still has some slight lower abdomen discomfort described as "bloating".  Breathing wise she is stable. She uses POC 2L with exertion as needed.  She is on oxygen at night with her CPAP. Denies f/c/s, diarrhea, nausea or vomitting.     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  Small pulmonary nodules measuring 5 mm or less stable dating back to 2018 consistent with a benign etiology.  OV 09/26/2021  Subjective:  [name], female , DOB: [birth year] , age 82 y.o. , MRN: [id number] , ADDRESS: [address] [place] [address] PCP Sigmund Hazel, MD Patient Care Team: Sigmund Hazel, MD as  PCP - General (Family Medicine) Thurmon Fair, MD as PCP - Cardiology (Cardiology) Freddy Finner, MD as Consulting Physician (Obstetrics and Gynecology) Sharrell Ku, MD as Consulting Physician (Gastroenterology) Croitoru, Rachelle Hora, MD as Consulting Physician (Cardiology) Ihor Gully, MD (Inactive) as Consulting Physician (Urology) Arminda Resides, MD as Consulting Physician (Dermatology) Kalman Shan, MD as Consulting Physician (Pulmonary Disease) Pollyann Savoy, MD as Consulting Physician (Rheumatology) Antonietta Barcelona, OD as Referring Physician (Optometry) Sanjuana Letters, MD as Referring Physician (Orthopedic Surgery)  This Provider for this visit: Treatment Team:  Attending Provider: Kalman Shan, MD    09/26/2021 -   Chief Complaint  Patient presents with   Follow-up    PFT performed today.  Pt states she has been doing okay since last visit.   Follow-up IPF   - sept 09, 2021 update - IPF dx givien: age greater than 31, only trace positive ANAdescription of small hiatal hernia on CT scan,  the type of pattern on the CT scan called probable UIP, autoimmune antibodies negative last year    - last CT July 2021   -last PFT sept 201  - portable o2 since sept 2020-08-02  -esbriet start end sept 08/02/2020 -> stopped 11/02/20 due to fatigue -> rstar 11/09/2020 -. Max dose 2 pilll tid since Dec 02, 2020 -> intermittent start/stop summer 2022 -> stopped 08-02-21 - Overnight pulse oximetry done on 03/12/2021 shows time spent less than equal to 88% is 40.3 minutes.  Average pulse was 60/min.  Time spent in bradycardia 72.5%. Start 2 L nasal cannula at night  Multiple Lung Nodules 7mm- stable 08/02/17 -> July 2021 Alpha-1 MZ but does not have emphysema on CT 08/02/20.  [Daughter died from alpha-1 CC] Obesity  Diastolic dysfunction Atrial fibrillation on Eliquis Obstructive sleep apnea on CPAP without oxygen COVID-19 incidental 12/28/2020 - likely Omicron4  HPI [name] [name] 82 y.o.  -presents ith daughter ? Marijo File meeting first time. Doing stable from symptom stand point resp wise but PFT down significantly. Took ofev for 5 days and there are aspectso of taking ofev (100mg  bid - the lower dose) tht she liked. She felt her urinary stream had better flow (not a known side effect). She had lower appetite (known Se with ofev) but she liked the curb on appetie it due to her obesity. The 5d into she had had mild discomfort in suprapubic area (known SE for abd discmofort) and had 1 drop of painless rectal bleed. So stopped ofev (also on eliquisl bleeding theoertical risk with ofev). After stopping ofev, urinary flow is back to baseline(does not like it), and no mpre rectal bleeding . She never had rectal bleeding before and afer. She again stated and made clear the so called bleeding was "1 drop of blood ont he floor". Associted with ofev intake on background of eliquis and associted with suprapubic discomfor. No dysuria. Dr Orlene Plum is her GI. Last colonoscopy was when she ws 74 and was normal apparently  Discussed  - seeing a GI doc for rectal bleed - she is agreeable   - discussed rechallenge with ofev v joining clinical trial phase 3( to be volunteer + exploit therapeutic advantage with 50% chance of placebo) Discussed other asoects of trials. Explained the 08/02/1994 Zephrysu study with IV MAB against CTGF Pamrevulumab v placeo.  See below. She and dauhger want her to do clinical trial but are more enthused about the idea of a ofev rechallenge first. We discussed that rechallenge liketly to cause side effects but anecdotally seems some patients who do not have issues. She is not interested in esbriet rechallenge       OV 10/10/2021  Subjective:  [name], female , DOB: 1940/12/29 , age 57 y.o. , MRN: [id number] , ADDRESS: [address] [place] [address] PCP Sigmund Hazel, MD Patient Care Team: Sigmund Hazel, MD as PCP - General (Family  Medicine) Thurmon Fair, MD as PCP - Cardiology (Cardiology) Freddy Finner, MD as Consulting Physician (Obstetrics and Gynecology) Sharrell Ku, MD as Consulting Physician (Gastroenterology) Croitoru, Rachelle Hora, MD as Consulting Physician (Cardiology) Ihor Gully, MD (Inactive) as Consulting Physician (Urology) Arminda Resides, MD as Consulting Physician (Dermatology) Kalman Shan, MD as Consulting Physician (Pulmonary Disease) Pollyann Savoy, MD as Consulting Physician (Rheumatology) Antonietta Barcelona, OD as Referring Physician (Optometry) Sanjuana Letters, MD as Referring Physician (Orthopedic Surgery)  This Provider for this visit: Treatment Team:  Attending Provider: Marchelle Gearing,  Carmin Muskrat, MD    10/10/2021 -   Chief Complaint  Patient presents with   Follow-up    Patient states that  she is doing good with the Ofev and is currently not having any side effects. States that she has been coughing a lot lately. Wants to know if she is due for pneumonia vaccine.   Type of visit: Telephone/Video Circumstance: COVID-19 national emergency Identification of patient [name] [name] with [birth year] and MRN [id number] - 2 person identifier Risks: Risks, benefits, limitations of telephone visit explained. Patient understood and verbalized agreement to proceed Anyone else on call: just patient Patient location: her home This provider location: [address], [place], [place] [address]   HPI [name] [name] 82 y.o. -at this point telephone visit is to see how rechallenge with nintedanib is ongoing.  Since 10/06/2021 Saturday she is on 100 mg twice daily.  She says she has not had any recurrence of bleeding.  No GI symptoms.  Tolerating it well.  Dyspnea is not any worse.  No new issues.  Noticed mild reduction in GFR in October 2022.  Liver function test and hemoglobin are fine.             OV 11/09/2021  Subjective:  [name], female , DOB: 1941/08/20 , age 14 y.o.  , MRN: [id number] , ADDRESS: [address] [place] [address] PCP Sigmund Hazel, MD Patient Care Team: Sigmund Hazel, MD as PCP - General (Family Medicine) Croitoru, Rachelle Hora, MD as PCP - Cardiology (Cardiology) Freddy Finner, MD as Consulting Physician (Obstetrics and Gynecology) Sharrell Ku, MD as Consulting Physician (Gastroenterology) Croitoru, Rachelle Hora, MD as Consulting Physician (Cardiology) Ihor Gully, MD (Inactive) as Consulting Physician (Urology) Arminda Resides, MD as Consulting Physician (Dermatology) Kalman Shan, MD as Consulting Physician (Pulmonary Disease) Pollyann Savoy, MD as Consulting Physician (Rheumatology) Antonietta Barcelona, OD as Referring Physician (Optometry) Sanjuana Letters, MD as Referring Physician (Orthopedic Surgery)  This Provider for this visit: Treatment Team:  Attending Provider: Kalman Shan, MD    11/09/2021 -   Chief Complaint  Patient presents with   Follow-up    Pt states she is about the same since last visit. Still becomes SOB with exertion.    HPI [name] [name] 82 y.o. -returns for follow-up.  She restarted nintedanib on 10/06/2021 at the lower dose.  She tells me that she has not had any rectal bleeding except yesterday there was again a few drops.  No abdominal pain.  Last visit I told her to go see GI doctor but she has not.  Her GI doctor was Dr. Matthias Hughs in [facility] but has since retired.  Advised that that I will make a referral.  In terms of shortness of breath she is stable.  She believes she is tolerating the nintedanib fine.  She is actually gained some weight.  She also reports that for the last 2 weeks she is on metoprolol because of cardiac rhythm issues.  At today blood pressure was 136 systolic.Marland Kitchen  She tells me this is high for her.  She has never had this problem before according to history.  But the observations only today.  She acknowledges that this could be whitecoat hypertension as well.  Last  CT scan of the chest July 2021 Last pulmonary function test October 2022  She is not fully compliant with the portable oxygen.     No results found.     OV 05/31/2022  Subjective:  Patient  ID: [name] [name], female , DOB: 1940-12-07 , age 26 y.o. , MRN: [id number] , ADDRESS: [address] [place] [address] PCP Sigmund Hazel, MD Patient Care Team: Sigmund Hazel, MD as PCP - General (Family Medicine) Thurmon Fair, MD as PCP - Cardiology (Cardiology) Freddy Finner, MD as Consulting Physician (Obstetrics and Gynecology) Sharrell Ku, MD as Consulting Physician (Gastroenterology) Croitoru, Rachelle Hora, MD as Consulting Physician (Cardiology) Ihor Gully, MD (Inactive) as Consulting Physician (Urology) Arminda Resides, MD as Consulting Physician (Dermatology) Kalman Shan, MD as Consulting Physician (Pulmonary Disease) Pollyann Savoy, MD as Consulting Physician (Rheumatology) Antonietta Barcelona, OD as Referring Physician (Optometry) Sanjuana Letters, MD as Referring Physician (Orthopedic Surgery)  This Provider for this visit: Treatment Team:  Attending Provider: Kalman Shan, MD   05/31/2022 -   Chief Complaint  Patient presents with   Follow-up    Pt states she is still having problems with her breathing. Also states that she feels like she is more constipated than before and feels like she might have some internal bleeding.     HPI [name] [name] 82 y.o. -on finger for several months.  She believes IPF is slowly getting worse.  Review of the symptom score shows that symptoms are definitely worse in the last 2 years but compared to 6 months ago around the same.  Shared high-resolution CT chest that shows progression of ILD over 2 years.  I shared the results with her.  This fits in with the symptoms also getting worse over the last 2 years.  She is worried about the progression.  In the past we recommended she consider clinical trial as a care  option but she declined.  This is still under consideration for her but she has not made up her mind.  Last echocardiogram was a few years ago.  Of note she has had rectal bleeding last year.  I referred her to Dr. Carole Binning and GI.  She had virtual colonoscopy.  She asked me to read the report.  It shows she has diverticulosis but does not seem anything hugely abnormal.  She is unsure if she still having rectal bleeding.  She thinks she might be.  She wants to switch GI doctors   She also tells me that since last visit she is on a new medicine for atrial fibrillation.  Tikosyn.  She wanted me to palpate a pulse.  I did this for 30 seconds and told her at least for the 30 seconds she is in sinus rhythm.     OV 09/03/2022  Subjective:  [name], female , DOB: October 01, 1941 , age 63 y.o. , MRN: [id number] , ADDRESS: [address] [place] [address] PCP Sigmund Hazel, MD Patient Care Team: Sigmund Hazel, MD as PCP - General (Family Medicine) Thurmon Fair, MD as PCP - Cardiology (Cardiology) Freddy Finner, MD (Inactive) as Consulting Physician (Obstetrics and Gynecology) Sharrell Ku, MD as Consulting Physician (Gastroenterology) Croitoru, Rachelle Hora, MD as Consulting Physician (Cardiology) Ihor Gully, MD (Inactive) as Consulting Physician (Urology) Arminda Resides, MD as Consulting Physician (Dermatology) Kalman Shan, MD as Consulting Physician (Pulmonary Disease) Pollyann Savoy, MD as Consulting Physician (Rheumatology) Antonietta Barcelona, OD as Referring Physician (Optometry) Sanjuana Letters, MD as Referring Physician (Orthopedic Surgery)  This Provider for this visit: Treatment Team:  Attending Provider: Kalman Shan, MD   09/03/2022 -   Chief Complaint  Patient presents with   Follow-up    Breathing is about  the same. She has some dry cough in the am.      HPI [name] [name] 82 y.o. -returns for follow-up.  Today's visit has  been compressed because instead of scheduling on 30 minutes she is on a 15-minute schedule.  For some reason the pulmonary function test is also not done.  In addition scheduling at the front desk was late.  Overall any event she is feeling stable she is tolerating 100 mg twice daily of nintedanib.  She is doing physical therapy and swimming and she is feeling stronger.  She does have some mild diarrhea on and off.  She has not had any more bright blood per rectum.  She is not sure if a GI appointment has gone through.  She says she is still waiting for that appointment.  Last set of labs was in August 2023.  She does have some amount of CKD  Review of the vaccination shows that she will have high-dose flu shot today she is agreed for this.  She is hesitant to get COVID mRNA vaccine.    CT Chest data  No results found.   OV 12/31/2022  Subjective:  [name], female , DOB: August 09, 1941 , age 77 y.o. , MRN: [id number] , ADDRESS: [address] [place] [address] PCP Kristian Covey, MD Patient Care Team: Kristian Covey, MD as PCP - General (Family Medicine) Croitoru, Rachelle Hora, MD as PCP - Cardiology (Cardiology) Freddy Finner, MD (Inactive) as Consulting Physician (Obstetrics and Gynecology) Sharrell Ku, MD as Consulting Physician (Gastroenterology) Croitoru, Rachelle Hora, MD as Consulting Physician (Cardiology) Ihor Gully, MD (Inactive) as Consulting Physician (Urology) Arminda Resides, MD as Consulting Physician (Dermatology) Kalman Shan, MD as Consulting Physician (Pulmonary Disease) Pollyann Savoy, MD as Consulting Physician (Rheumatology) Antonietta Barcelona, OD as Referring Physician (Optometry) Sanjuana Letters, MD as Referring Physician (Orthopedic Surgery)  This Provider for this visit: Treatment Team:  Attending Provider: Kalman Shan, MD   12/31/2022 -   Chief Complaint  Patient presents with   Follow-up    She missed  her appt for PFT today. Breathing is worse over the past 5-6 wks. She is getting her resp virus- took mucinex and her cough has improved. She states that she gets winded walking approx 15 ft. She is exercising 4 x per wk- has lost seven lbs since last visit here 11/15/22. She stopped OFEV approx 2 months ago due to diarrhea.     HPI [name] [name] 82 y.o. -returns for follow-up.  In the interim she stopped nintedanib because of diarrhea.  This was 2 months ago.  She is exercising and has lost some weight see below.  Symptoms are stable.  Walking desaturation test is stable.  She did not show up for a pulmonary function test today.  She tells me that the scheduler confused her when she called to confirm the appointment.  They told her the appointment is 1:00 but they forgot to inform her about the PFT and therefore she forgot about the PFT.  This is according to her.  At this point in time she is content with supportive care without any antifibrotic.  She is not interested in clinical trials.  I discussed this again with her. Unable to do glutathione OPTC due to lack of availabiity      OV 07/22/2023  Subjective:  [name], female , DOB: 01-27-1941 , age 8 y.o. , MRN: [id number] , ADDRESS: [address] [place] [address] PCP Caryl Never, Elberta Fortis, MD Patient Care Team: Kristian Covey, MD as PCP - General (Family Medicine) Croitoru, Rachelle Hora, MD as PCP - Cardiology (Cardiology) Freddy Finner, MD (Inactive) as Consulting Physician (Obstetrics and Gynecology) Sharrell Ku, MD as Consulting Physician (Gastroenterology) Croitoru, Rachelle Hora, MD as Consulting Physician (Cardiology) Ihor Gully, MD (Inactive) as Consulting Physician (Urology) Arminda Resides, MD as Consulting Physician (Dermatology) Kalman Shan, MD as Consulting Physician (Pulmonary Disease) Pollyann Savoy, MD as Consulting Physician (Rheumatology) Antonietta Barcelona, OD as Referring  Physician (Optometry) Sanjuana Letters, MD as Referring Physician (Orthopedic Surgery)  This Provider for this visit: Treatment Team:  Attending Provider: Kalman Shan, MD    07/22/2023 -   Chief Complaint  Patient presents with   Follow-up    F/up      Follow-up IPF   - sept 09, 07-31-20 update - IPF dx givien: age greater than 07[birth year]31-2064, only trace positive ANAdescription of small hiatal hernia on CT scan, the type of pattern on the CT scan 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 Max dose 2 pilll tid since Dec 02, 2020 -> intermittent start/stop summer 07/31/21 -> stopped Jul 31, 2021 - OFEV start Nov 2022  - low dose -> stopped 2022-07-31 due to sid effects   - LAST HRT June 2023   Nocutruanl hyoxeia   - Overnight pulse oximetry done on 03/12/2021 shows time spent less than equal to 88% is 40.3 minutes.  Average pulse was 60/min.  Time spent in bradycardia 72.5%. Start 2 L nasal cannula at night  Multiple Lung Nodules 7mm- stable 2017/07/31 -> July 2021 Alpha-1 MZ but does not have emphysema on CT 07/31/20.  [Daughter died from alpha-1 CC] Obesity  Diastolic dysfunction Atrial fibrillation on Eliquis Obstructive sleep apnea on CPAP without oxygen COVID-19 incidental 12/28/2020 - likely Omicron4  HPI [name] [name] 82 y.o. -returns for IPF follow-up.  She is in supportive care.  Last pulmonary function test was in October 2022 almost 2 years ago.  Last CT scan of the chest in June 2023 just over a year ago.  Last visit was in January 2024.  Overall she says she is stable.  She is not taking any antifibrotic's.  She says her atrial fibrillation is in control/sinus rhythm.  She did see Dr. C8/15/24.  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  Frequent falls.    SYMPTOM SCALE - ILD 07/21/2019   08/10/2020 262# 09/26/2020 Last Weight  Most recent update: 09/26/2020  1:42 PM      Weight  120.9 kg (266 lb 9.6 oz)                     11/09/2020 274# 01/23/2021 279# 08/14/2021 275# 09/18/2021 Started OFEV 10/13 but stoped 10/17 d/t GIB  09/26/2021 Off ofev. No more "rectal bleed" 11/09/2021 Back on ofev 100mg  bid since 10/06/21  280# 05/31/2022 276# 12/31/2022 260# 07/22/2023   O2 use ra ra ra o2 with ex 02 with ex   O2 with exertoin       Shortness of Breath 0 -> 5 scale with 5 being worst (score  6 If unable to do)                  At rest 0 0   5 0 4 0 0 0 0 0   Simple tasks - showers, clothes change, eating, shaving 4 3   2 2 2 5  3.5 4 4 3    Household (dishes, doing bed, laundry) 4 3   2 3 2 4 4 4 4  x   Shopping 5 3   2 3 2  4.5 4 4.5 4 5    Walking level at own pace 5 3   2 3 2 4 5  4.5 3 3    Walking up Stairs 5 5   1 5 6 6 6 6 6 5    Total (40 - 48) Dyspnea Score 23 17   14 16 18  23.5 22.5 23 21 16    How bad is your cough? 1 1-3 1 1 2 2 1 1  1 2    How bad is your fatigue 4 3 3 4 4 3 3  3.5 3 4 4    nausea   0 0 0 0 0 0 0 0 0.25 2.5   vomit   0 0 0 0 0 0 0 0 0 1.5   diarrhea   0 0 0 1 0 0.5 0 00 Once in a while constipaged   axiety   2 1 0 0 0.5 0.5 0 0 0 x   depression   2 1.5 2 2 2 1  0.5-1 1 3  3+         Simple office walk 185 feet x  3 laps goal with forehead probe 07/21/2019   08/10/2020   01/23/2021   09/18/2021  05/31/2022  12/31/2022 No show for PFT   O2 used ra ra ra RA ra ra   Number laps completed 3 laps 3 3 2  Only 1 of 3 laps Did  2 of 3   Comments about pace modertae with walker Slow pace Sow pace Slow pace slow    Resting Pulse Ox/HR 97% and 78/min 100% and 74/min 94%and HR 92 96% RA and HR 80 98% and HR 64 98%    Final Pulse Ox/HR 93% and 109/min 77% and 85/min 90 % and HR 100 90% and HR 125 97% and HR 105 95%   Desaturated </= 88% no no no No no    Desaturated <= 3% points Yes, 4 Yes, 13 points Yes,  4 piints Yes, 6 points no    Got Tachycardic >/= 90/min you have pulmonary fibrosis otherwise call interstitial lung disease yes no yes Yes yes    Symptoms at end of test dyspnea Mild dyspnea Dyspnea throughtou Dyspnea  Mod dspnea    Miscellaneous comments x Corrected with 2LNC and got    X  Very deconditioned     PFT     Latest Ref Rng & Units 09/26/2021    2:58 PM 05/07/2021    3:12 PM 08/10/2020   10:03 AM 07/19/2019    3:20 PM 02/24/2017   11:58 AM  ILD indicators  FVC-Pre L 2.38  2.53  2.57  2.74  2.58   FVC-Predicted Pre % 79  84  84  93  85   FVC-Post L    2.63  2.45   FVC-Predicted Post %    89  81   TLC L   4.50  3.97    TLC Predicted %   81  74    DLCO uncorrected  ml/min/mmHg 9.76  15.50  15.83  16.52  16.15   DLCO UNC %Pred % 47  74  76  81  59   DLCO Corrected ml/min/mmHg 9.48  15.50  15.34   14.93   DLCO COR %Pred % 45  74  73   55       LAB RESULTS last 96 hours No results found.  LAB RESULTS last 90 days Recent Results (from the past 2160 hour(s))  Comprehensive metabolic panel     Status: Abnormal   Collection Time: 06/10/23  1:18 PM  Result Value Ref Range   Sodium 137 135 - 145 mmol/L   Potassium 4.1 3.5 - 5.1 mmol/L   Chloride 102 98 - 111 mmol/L   CO2 27 22 - 32 mmol/L   Glucose, Bld 121 (H) 70 - 99 mg/dL    Comment: Glucose reference range applies only to samples taken after fasting for at least 8 hours.   BUN 20 8 - 23 mg/dL   Creatinine, Ser 8.11 (H) 0.44 - 1.00 mg/dL   Calcium 8.8 (L) 8.9 - 10.3 mg/dL   Total Protein 7.0 6.5 - 8.1 g/dL   Albumin 3.2 (L) 3.5 - 5.0 g/dL   AST 25 15 - 41 U/L   ALT 18 0 - 44 U/L   Alkaline Phosphatase 76 38 - 126 U/L   Total Bilirubin 0.6 0.3 - 1.2 mg/dL   GFR, Estimated 46 (L) >60 mL/min    Comment: (NOTE) Calculated using the CKD-EPI Creatinine Equation (2021)    Anion gap 8 5 - 15    Comment: Performed at [facility], [address], [place], [place] [address]  CBC     Status: Abnormal   Collection  Time: 06/10/23  1:18 PM  Result Value Ref Range   WBC 8.8 4.0 - 10.5 K/uL   RBC 5.58 (H) 3.87 - 5.11 MIL/uL   Hemoglobin 15.6 (H) 12.0 - 15.0 g/dL   HCT 29.5 (H) 62.1 - 30.8 %   MCV 86.2 80.0 - 100.0 fL   MCH 28.0 26.0 - 34.0 pg   MCHC 32.4 30.0 - 36.0 g/dL   RDW 65.7 84.6 - 96.2 %   Platelets 238 150 - 400 K/uL   nRBC 0.0 0.0 - 0.2 %    Comment: Performed at [facility], [address], [place], [place] [address]  Lactic acid, plasma     Status: None   Collection Time: 06/10/23  1:18 PM  Result Value Ref Range   Lactic Acid, Venous 1.2 0.5 - 1.9 mmol/L    Comment: Performed at [facility], [address] [place], [place] [address]  Protime-INR     Status: None   Collection Time: 06/10/23  1:18 PM  Result Value Ref Range   Prothrombin Time 14.5 11.4 - 15.2 seconds   INR 1.1 0.8 - 1.2    Comment: (NOTE) INR goal varies based on device and disease states. Performed at [facility], [address], [place], [place] [address]   Magnesium     Status: None   Collection Time: 06/10/23  1:18 PM  Result Value Ref Range   Magnesium 2.1 1.7 - 2.4 mg/dL    Comment: Performed at [facility], [address], [place], [place] [address]  Urinalysis, Routine w reflex microscopic -Urine, Clean Catch     Status: Abnormal   Collection Time: 06/10/23  6:45 PM  Result Value Ref Range  Color, Urine STRAW (A) YELLOW   APPearance CLEAR CLEAR   Specific Gravity, Urine 1.025 1.005 - 1.030   pH 7.0 5.0 - 8.0   Glucose, UA NEGATIVE NEGATIVE mg/dL   Hgb urine dipstick NEGATIVE NEGATIVE   Bilirubin Urine NEGATIVE NEGATIVE   Ketones, ur NEGATIVE NEGATIVE mg/dL   Protein, ur NEGATIVE NEGATIVE mg/dL   Nitrite NEGATIVE NEGATIVE   Leukocytes,Ua NEGATIVE NEGATIVE    Comment: Performed at [facility], [address] [place], [place] [address]  CUP PACEART REMOTE DEVICE CHECK     Status: None   Collection Time: 07/09/23 11:33 PM  Result Value Ref Range   Date Time  Interrogation Session [id number]    Pulse Generator Manufacturer MERM    Pulse Gen Model [id number] Advisa DR MRI    Pulse Gen Serial Number [id number]    Clinic Name [facility]    Implantable Pulse Generator Type Implantable Pulse Generator    Implantable Pulse Generator Implant Date [id number]    Implantable Lead Manufacturer MERM    Implantable Lead Model 5076 CapSureFix Novus    Implantable Lead Serial Number [id number]    Implantable Lead Implant Date [id number]    Implantable Lead Location Detail 1 APPENDAGE    Implantable Lead Location [id number]    Implantable Lead Connection Status [id number]    Implantable Lead Manufacturer MERM    Implantable Lead Model 5076 CapSureFix Novus    Implantable Lead Serial Number [id number]    Implantable Lead Implant Date [id number]    Implantable Lead Location Detail 1 APEX    Implantable Lead Location [id number]    Implantable Lead Connection Status [id number]    Lead Channel Setting Sensing Sensitivity 2.8 mV   Lead Channel Setting Pacing Amplitude 1.5 V   Lead Channel Setting Pacing Pulse Width 0.4 ms   Lead Channel Setting Pacing Amplitude 2 V   Zone Setting Status 755011    Zone Setting Status [phone]    Lead Channel Impedance Value 456 ohm   Lead Channel Impedance Value 418 ohm   Lead Channel Sensing Intrinsic Amplitude 4 mV   Lead Channel Sensing Intrinsic Amplitude 4 mV   Lead Channel Pacing Threshold Amplitude 0.625 V   Lead Channel Pacing Threshold Pulse Width 0.4 ms   Lead Channel Impedance Value 532 ohm   Lead Channel Impedance Value 513 ohm   Lead Channel Sensing Intrinsic Amplitude 6.75 mV   Lead Channel Sensing Intrinsic Amplitude 6.75 mV   Lead Channel Pacing Threshold Amplitude 0.5 V   Lead Channel Pacing Threshold Pulse Width 0.4 ms   Battery Status OK    Battery Remaining Longevity 24 mo   Battery Voltage 2.92 V   Brady Statistic RA Percent Paced 94.86 %   Brady Statistic RV Percent Paced 0.29 %   Brady Statistic AP VP Percent 0.19  %   Brady Statistic AS VP Percent 0 %   Brady Statistic AP VS Percent 94.84 %   Brady Statistic AS VS Percent 4.97 %         has a past medical history of Alpha-1-antitrypsin deficiency carrier, Arthritis, Atherosclerosis of aorta (HCC), Atrial fibrillation (HCC), Back pain, Complication of anesthesia, Depression, Diastolic dysfunction, Eating disorder, Frequent UTI, GERD (gastroesophageal reflux disease), Heart disease, Hypertension, Hypertriglyceridemia, Insulin resistance, Long term current use of anticoagulant, Multiple lung nodules, Muscular deconditioning, MVP (mitral valve prolapse), Obesity, OSA on CPAP, Osteoarthritis, Osteopenia, Pacemaker, PAF (paroxysmal atrial fibrillation) (HCC), Positive ANA (antinuclear antibody), Presbyesophagus, Presence of permanent cardiac pacemaker, Primary osteoarthritis  of both hands, Pulmonary fibrosis (HCC), PVC's (premature ventricular contractions), Shortness of breath, Sinus arrest (10/2014), Sleep apnea, Stroke (HCC) (01/2014), Tachy-brady syndrome (HCC) (10/2014), TIA (transient ischemic attack), and Vitamin D deficiency.   reports that she quit smoking about 53 years ago. Her smoking use included cigarettes. She started smoking about 71 years ago. She has a 36 pack-year smoking history. She has never used smokeless tobacco.  Past Surgical History:  Procedure Laterality Date   CARDIOVERSION N/A 09/02/2017   Procedure: CARDIOVERSION;  Surgeon: Thurmon Fair, MD;  Location: [facility];  Service: Cardiovascular;  Laterality: N/A;   COLONOSCOPY  2019   ESOPHAGEAL DILATION     EXCISION VAGINAL CYST     benign nodules   INSERT / REPLACE / REMOVE PACEMAKER  10/04/2014   Medtronic Advisa model A2DR01 serial number [id number]   LOOP RECORDER EXPLANT N/A 10/04/2014   Procedure: LOOP RECORDER EXPLANT;  Surgeon: Thurmon Fair, MD;  Location: [facility];  Service: Cardiovascular;  Laterality: N/A;   LOOP RECORDER IMPLANT N/A 04/19/2014   Procedure: LOOP  RECORDER IMPLANT;  Surgeon: Thurmon Fair, MD;  Location: [facility];  Service: Cardiovascular;  Laterality: N/A;   NM MYOCAR PERF WALL MOTION  12/18/2007   normal   PERMANENT PACEMAKER INSERTION N/A 10/04/2014   Procedure: PERMANENT PACEMAKER INSERTION;  Surgeon: Thurmon Fair, MD;  Location: [facility];  Service: Cardiovascular;  Laterality: N/A;   TUBAL LIGATION     US ECHOCARDIOGRAPHY  05/15/2010   LA mildly dilated,mild mitral annular ca+, AOV mildly sclerotic, mild asymmetric LVH    Allergies  Allergen Reactions   Cefazolin Hives and Itching    Other reaction(s): severe hives   Pseudoephedrine Hcl     Other reaction(s): palpitations (moderate)   Ergotamine     unknown   Other Other (See Comments)   Pentobarbital     unknown   Pirfenidone     unknown   Caffeine Palpitations    Immunization History  Administered Date(s) Administered   Fluad Quad(high Dose 65+) 08/14/2021, 09/03/2022   Influenza Split 10/09/2009, 09/19/2010, 09/13/2020   Influenza Whole 09/23/2008   Influenza, High Dose Seasonal PF 09/13/2018, 09/27/2019, 09/26/2020   Influenza,inj,quad, With Preservative 09/04/2017, 09/05/2021   Influenza-Unspecified 09/17/2014, 09/09/2016, 09/04/2017   Moderna Sars-Covid-2 Vaccination 12/06/2019, 01/03/2020   Pneumococcal Conjugate-13 09/29/2014, 10/16/2015   Pneumococcal Polysaccharide-23 04/06/2009, 10/02/2017   Pneumococcal-Unspecified 10/01/2014, 10/02/2017   Td 04/05/1999   Tdap 04/06/2009   Zoster Recombinant(Shingrix) 09/29/2018, 01/19/2019   Zoster, Live 12/02/2008, 09/29/2018, 01/19/2019    Family History  Problem Relation Age of Onset   Heart attack Mother    Sudden death Mother    Stroke Father    Heart failure Father    Alcoholism Brother    Healthy Sister    Stroke Brother    Alpha-1 antitrypsin deficiency Daughter    Breast cancer Daughter    Prostate cancer Brother    Stroke Brother    Prostate cancer Brother    Healthy Brother     Prostate cancer Brother    Healthy Daughter    Diabetes Son    Arthritis Son      Current Outpatient Medications:    apixaban (ELIQUIS) 5 MG TABS tablet, Take 1 tablet (5 mg total) by mouth 2 (two) times daily., Disp: 28 tablet, Rfl: 0   Cholecalciferol (VITAMIN D3 PO), Take 1 tablet by mouth daily., Disp: , Rfl:    diazepam (VALIUM) 5 MG tablet, TAKE 1 TABLET ONCE A DAY AS  NEEDED., Disp: 30 tablet, Rfl: 0   dofetilide (TIKOSYN) 500 MCG capsule, Take 1 capsule (500 mcg total) by mouth 2 (two) times daily., Disp: 180 capsule, Rfl: 1   fluticasone (FLONASE) 50 MCG/ACT nasal spray, Place 1 spray into both nostrils daily., Disp: 16 g, Rfl: 2   Magnesium 400 MG CAPS, Take 400 mg by mouth daily., Disp: 30 capsule, Rfl: 0   metoprolol tartrate (LOPRESSOR) 25 MG tablet, Take 0.5 tablets (12.5 mg total) by mouth 2 (two) times daily., Disp: 90 tablet, Rfl: 3   nitroGLYCERIN (NITROSTAT) 0.4 MG SL tablet, Place 1 tablet (0.4 mg total) under the tongue every 5 (five) minutes as needed for chest pain., Disp: 25 tablet, Rfl: 1   omeprazole (PRILOSEC) 40 MG capsule, Take 1 capsule by mouth daily., Disp: , Rfl:    sertraline (ZOLOFT) 100 MG tablet, TAKE ONE TABLET BY MOUTH ONCE DAILY., Disp: 90 tablet, Rfl: 3   ipratropium (ATROVENT) 0.06 % nasal spray, Place 2 sprays into both nostrils 4 (four) times daily as needed for rhinitis. (Patient not taking: Reported on 07/17/2023), Disp: 15 mL, Rfl: 12      Objective:   Vitals:   07/22/23 1343  BP: 110/70  Pulse: 64  SpO2: 93%  Weight: 262 lb 3.2 oz (118.9 kg)  Height: 5\' 7"  (1.702 m)    Estimated body mass index is 41.07 kg/m as calculated from the following:   Height as of this encounter: 5\' 7"  (1.702 m).   Weight as of this encounter: 262 lb 3.2 oz (118.9 kg).  @WEIGHTCHANGE @  [employer]   07/22/23 1343  Weight: 262 lb 3.2 oz (118.9 kg)     Physical Exam   General: No distress. Looks same O2 at rest: no Cane present: no Sitting in  wheel chair: no Frail: no Obese: YES Neuro: Alert and Oriented x 3. GCS 15. Speech normal Psych: Pleasant Resp:  Barrel Chest - no.  Wheeze - no, Crackles - YESn, No overt respiratory distress CVS: Normal heart sounds. Murmurs - no Ext: Stigmata of Connective Tissue Disease - n HEENT: Normal upper airway. PEERL +. No post nasal drip        Assessment:       ICD-10-CM   1. IPF (idiopathic pulmonary fibrosis) (HCC)  J84.112          Plan:     Patient Instructions  IPF (idiopathic pulmonary fibrosis) (HCC)   - NOT SEEN you iin > 1 year but glad you are stable on supportive care  PLAN - respect no intersest in approved antifibrotic or trials - check spirometry/dlco in 3 months; last Oct 2022 - do HRCT supine and proine in 3 monthsl last June 2023   Followup - 4 months after  spirometry and dlco and CT - return for 15 min visith wth DR Marchelle Gearing in 4 months but after PFT  - walk test and symptoms questionnaire at followup   FOLLOWUP Return in about 3 months (around 10/22/2023) for 15 min visit, after HRCT chest, after Cleda Daub and DLCO, ILD, with Dr Marchelle Gearing, Face to Face Visit.    SIGNATURE    Dr. Kalman Shan, M.D., F.C.C.P,  Pulmonary and Critical Care Medicine Staff Physician, [facility] Center Director - Interstitial Lung Disease  Program  Pulmonary Fibrosis [facility] at [facility] [place], [place], [address]  Pager: [phone], If no answer or between  15:00h - 7:00h: call [phone] Telephone: [phone]  1:59 PM 07/22/2023   Moderate Complexity MDM OFFICE  2021 E/M guidelines, first released in 2021, with minor revisions added in 2023 and 2024 Must meet the requirements for 2 out of 3 dimensions to qualify.    Number and complexity of problems addressed Amount and/or complexity of data reviewed Risk of complications and/or morbidity  One or more chronic illness with mild exacerbation, OR progression, OR   side effects of treatment  Two or more stable chronic illnesses  One undiagnosed new problem with uncertain prognosis  One acute illness with systemic symptoms   One Acute complicated injury Must meet the requirements for 1 of 3 of the categories)  Category 1: Tests and documents, historian  Any combination of 3 of the following:  Assessment requiring an independent historian  Review of prior external note(s) from each unique source  Review of results of each unique test  Ordering of each unique test    Category 2: Interpretation of tests   Independent interpretation of a test performed by another physician/other qualified health care professional (not separately reported)  Category 3: Discuss management/tests  Discussion of management or test interpretation with external physician/other qualified health care professional/appropriate source (not separately reported) Moderate risk of morbidity from additional diagnostic testing or treatment Examples only:  Prescription drug management  Decision regarding minor surgery with identfied patient or procedure risk factors  Decision regarding elective major surgery without identified patient or procedure risk factors  Diagnosis or treatment significantly limited by social determinants of health             HIGh Complexity  OFFICE   2021 E/M guidelines, first released in 2021, with minor revisions added in 2023. Must meet the requirements for 2 out of 3 dimensions to qualify.    Number and complexity of problems addressed Amount and/or complexity of data reviewed Risk of complications and/or morbidity  Severe exacerbation of chronic illness  Acute or chronic illnesses that may pose a threat to life or bodily function, e.g., multiple trauma, acute MI, pulmonary embolus, severe respiratory distress, progressive rheumatoid arthritis, psychiatric illness with potential threat to self or others, peritonitis, acute renal  failure, abrupt change in neurological status Must meet the requirements for 2 of 3 of the categories)  Category 1: Tests and documents, historian  Any combination of 3 of the following:  Assessment requiring an independent historian  Review of prior external note(s) from each unique source  Review of results of each unique test  Ordering of each unique test    Category 2: Interpretation of tests    Independent interpretation of a test performed by another physician/other qualified health care professional (not separately reported)  Category 3: Discuss management/tests  Discussion of management or test interpretation with external physician/other qualified health care professional/appropriate source (not separately reported)  HIGH risk of morbidity from additional diagnostic testing or treatment Examples only:  Drug therapy requiring intensive monitoring for toxicity  Decision for elective major surgery with identified pateint or procedure risk factors  Decision regarding hospitalization or escalation of level of care  Decision for DNR or to de-escalate care   Parenteral controlled  substances            LEGEND - Independent interpretation involves the interpretation of a test for which there is a CPT code, and an interpretation or report is customary. When a review and interpretation of a test is performed and documented by the provider, but not separately reported (billed), then this would represent an  independent interpretation. This report does not need to conform to the usual standards of a complete report of the test. This does not include interpretation of tests that do not have formal reports such as a complete blood count with differential and blood cultures. Examples would include reviewing a chest radiograph and documenting in the medical record an interpretation, but not separately reporting (billing) the interpretation of the chest radiograph.   An  appropriate source includes professionals who are not health care professionals but may be involved in the management of the patient, such as a Clinical research associate, upper officer, case manager or teacher, and does not include discussion with family or informal caregivers.    - SDOH: SDOH are the conditions in the environments where people are born, live, learn, work, play, worship, and age that affect a wide range of health, functioning, and quality-of-life outcomes and risks. (e.g., housing, food insecurity, transportation, etc.). SDOH-related Z codes ranging from Z55-Z65 are the ICD-10-CM diagnosis codes used to document SDOH data Z55 - Problems related to education and literacy Z56 - Problems related to employment and unemployment Z57 - Occupational exposure to risk factors Z58 - Problems related to physical environment Z59 - Problems related to housing and economic circumstances [phone] - Problems related to social environment [phone] - Problems related to upbringing [phone] - Other problems related to primary support group, including family circumstances Z43 - Problems related to certain psychosocial circumstances Z65 - Problems related to other psychosocial circumstances

## 2023-07-22 NOTE — Addendum Note (Signed)
Addended by: Hedda Slade on: 07/22/2023 02:13 PM   Modules accepted: Orders

## 2023-07-24 NOTE — Progress Notes (Signed)
Remote pacemaker transmission.   

## 2023-07-28 ENCOUNTER — Ambulatory Visit: Payer: Medicare Other | Admitting: Internal Medicine

## 2023-07-28 DIAGNOSIS — M545 Low back pain, unspecified: Secondary | ICD-10-CM | POA: Diagnosis not present

## 2023-07-28 DIAGNOSIS — M6281 Muscle weakness (generalized): Secondary | ICD-10-CM | POA: Diagnosis not present

## 2023-07-28 DIAGNOSIS — R2681 Unsteadiness on feet: Secondary | ICD-10-CM | POA: Diagnosis not present

## 2023-07-28 DIAGNOSIS — R278 Other lack of coordination: Secondary | ICD-10-CM | POA: Diagnosis not present

## 2023-07-29 DIAGNOSIS — N390 Urinary tract infection, site not specified: Secondary | ICD-10-CM | POA: Diagnosis not present

## 2023-07-30 ENCOUNTER — Ambulatory Visit: Payer: Medicare Other | Admitting: Family Medicine

## 2023-07-30 DIAGNOSIS — R278 Other lack of coordination: Secondary | ICD-10-CM | POA: Diagnosis not present

## 2023-07-30 DIAGNOSIS — M545 Low back pain, unspecified: Secondary | ICD-10-CM | POA: Diagnosis not present

## 2023-07-30 DIAGNOSIS — R300 Dysuria: Secondary | ICD-10-CM

## 2023-07-30 DIAGNOSIS — M6281 Muscle weakness (generalized): Secondary | ICD-10-CM | POA: Diagnosis not present

## 2023-07-30 DIAGNOSIS — R399 Unspecified symptoms and signs involving the genitourinary system: Secondary | ICD-10-CM

## 2023-07-30 DIAGNOSIS — R2681 Unsteadiness on feet: Secondary | ICD-10-CM | POA: Diagnosis not present

## 2023-08-04 DIAGNOSIS — R278 Other lack of coordination: Secondary | ICD-10-CM | POA: Diagnosis not present

## 2023-08-04 DIAGNOSIS — G4733 Obstructive sleep apnea (adult) (pediatric): Secondary | ICD-10-CM | POA: Diagnosis not present

## 2023-08-04 DIAGNOSIS — M545 Low back pain, unspecified: Secondary | ICD-10-CM | POA: Diagnosis not present

## 2023-08-04 DIAGNOSIS — R2681 Unsteadiness on feet: Secondary | ICD-10-CM | POA: Diagnosis not present

## 2023-08-04 DIAGNOSIS — M6281 Muscle weakness (generalized): Secondary | ICD-10-CM | POA: Diagnosis not present

## 2023-08-05 ENCOUNTER — Other Ambulatory Visit: Payer: Self-pay | Admitting: Cardiovascular Disease

## 2023-08-05 DIAGNOSIS — I480 Paroxysmal atrial fibrillation: Secondary | ICD-10-CM

## 2023-08-05 NOTE — Telephone Encounter (Signed)
Eliquis 5mg  refill request received. Patient is 82 years old, weight-118.9kg, Crea-1.18 on 06/10/23, Diagnosis-Afib, and last seen by Dr. Royann Shivers on 07/17/23. Dose is appropriate based on dosing criteria. Will send in refill to requested pharmacy.

## 2023-08-06 DIAGNOSIS — M545 Low back pain, unspecified: Secondary | ICD-10-CM | POA: Diagnosis not present

## 2023-08-06 DIAGNOSIS — M6281 Muscle weakness (generalized): Secondary | ICD-10-CM | POA: Diagnosis not present

## 2023-08-06 DIAGNOSIS — R2681 Unsteadiness on feet: Secondary | ICD-10-CM | POA: Diagnosis not present

## 2023-08-06 DIAGNOSIS — R278 Other lack of coordination: Secondary | ICD-10-CM | POA: Diagnosis not present

## 2023-08-07 ENCOUNTER — Telehealth: Payer: Self-pay | Admitting: Family Medicine

## 2023-08-07 NOTE — Telephone Encounter (Signed)
Thayer Ohm [facility] calling and would like to know if md would prescribe something the patient has itching,redness and burning in perineal area for about 3 wks. Thayer Ohm is aware md not in office today.  [facility] [place], [place] - [address] [place] Phone: [phone]  Fax: [phone]

## 2023-08-08 MED ORDER — NYSTATIN 100000 UNIT/GM EX CREA: TOPICAL_CREAM | CUTANEOUS | 0 refills | Status: AC

## 2023-08-08 NOTE — Telephone Encounter (Signed)
Rx done.  Left a detailed message on Thayer Ohm' voicemail the Rx was sent to [facility] and the patient will need a follow up appt as below.

## 2023-08-10 DIAGNOSIS — M545 Low back pain, unspecified: Secondary | ICD-10-CM | POA: Diagnosis not present

## 2023-08-10 DIAGNOSIS — R2681 Unsteadiness on feet: Secondary | ICD-10-CM | POA: Diagnosis not present

## 2023-08-10 DIAGNOSIS — R278 Other lack of coordination: Secondary | ICD-10-CM | POA: Diagnosis not present

## 2023-08-10 DIAGNOSIS — M6281 Muscle weakness (generalized): Secondary | ICD-10-CM | POA: Diagnosis not present

## 2023-08-14 DIAGNOSIS — R2681 Unsteadiness on feet: Secondary | ICD-10-CM | POA: Diagnosis not present

## 2023-08-14 DIAGNOSIS — M545 Low back pain, unspecified: Secondary | ICD-10-CM | POA: Diagnosis not present

## 2023-08-14 DIAGNOSIS — M6281 Muscle weakness (generalized): Secondary | ICD-10-CM | POA: Diagnosis not present

## 2023-08-14 DIAGNOSIS — R278 Other lack of coordination: Secondary | ICD-10-CM | POA: Diagnosis not present

## 2023-08-18 DIAGNOSIS — M6281 Muscle weakness (generalized): Secondary | ICD-10-CM | POA: Diagnosis not present

## 2023-08-18 DIAGNOSIS — M545 Low back pain, unspecified: Secondary | ICD-10-CM | POA: Diagnosis not present

## 2023-08-18 DIAGNOSIS — R278 Other lack of coordination: Secondary | ICD-10-CM | POA: Diagnosis not present

## 2023-08-18 DIAGNOSIS — R2681 Unsteadiness on feet: Secondary | ICD-10-CM | POA: Diagnosis not present

## 2023-08-28 DIAGNOSIS — R278 Other lack of coordination: Secondary | ICD-10-CM | POA: Diagnosis not present

## 2023-08-28 DIAGNOSIS — M545 Low back pain, unspecified: Secondary | ICD-10-CM | POA: Diagnosis not present

## 2023-08-28 DIAGNOSIS — M6281 Muscle weakness (generalized): Secondary | ICD-10-CM | POA: Diagnosis not present

## 2023-08-28 DIAGNOSIS — R2681 Unsteadiness on feet: Secondary | ICD-10-CM | POA: Diagnosis not present

## 2023-09-01 DIAGNOSIS — M6281 Muscle weakness (generalized): Secondary | ICD-10-CM | POA: Diagnosis not present

## 2023-09-01 DIAGNOSIS — M545 Low back pain, unspecified: Secondary | ICD-10-CM | POA: Diagnosis not present

## 2023-09-01 DIAGNOSIS — R2681 Unsteadiness on feet: Secondary | ICD-10-CM | POA: Diagnosis not present

## 2023-09-01 DIAGNOSIS — R278 Other lack of coordination: Secondary | ICD-10-CM | POA: Diagnosis not present

## 2023-09-02 ENCOUNTER — Encounter (HOSPITAL_BASED_OUTPATIENT_CLINIC_OR_DEPARTMENT_OTHER): Payer: Medicare Other

## 2023-09-03 DIAGNOSIS — M545 Low back pain, unspecified: Secondary | ICD-10-CM | POA: Diagnosis not present

## 2023-09-03 DIAGNOSIS — R2681 Unsteadiness on feet: Secondary | ICD-10-CM | POA: Diagnosis not present

## 2023-09-03 DIAGNOSIS — M6281 Muscle weakness (generalized): Secondary | ICD-10-CM | POA: Diagnosis not present

## 2023-09-03 DIAGNOSIS — R278 Other lack of coordination: Secondary | ICD-10-CM | POA: Diagnosis not present

## 2023-09-04 ENCOUNTER — Ambulatory Visit: Payer: Medicare Other

## 2023-09-04 DIAGNOSIS — J84112 Idiopathic pulmonary fibrosis: Secondary | ICD-10-CM | POA: Diagnosis not present

## 2023-09-04 LAB — PULMONARY FUNCTION TEST
DL/VA % pred: 98 %
DL/VA: 3.92 ml/min/mmHg/L
DLCO cor % pred: 59 %
DLCO cor: 12.22 ml/min/mmHg
DLCO unc % pred: 59 %
DLCO unc: 12.22 ml/min/mmHg
FEF 25-75 Pre: 2.26 L/s
FEF2575-%Pred-Pre: 152 %
FEV1-%Pred-Pre: 83 %
FEV1-Pre: 1.8 L
FEV1FVC-%Pred-Pre: 114 %
FEV6-%Pred-Pre: 77 %
FEV6-Pre: 2.14 L
FEV6FVC-%Pred-Pre: 105 %
FVC-%Pred-Pre: 73 %
FVC-Pre: 2.14 L
Pre FEV1/FVC ratio: 84 %
Pre FEV6/FVC Ratio: 100 %

## 2023-09-04 NOTE — Progress Notes (Signed)
Spirometry/DLCO performed today. 

## 2023-09-04 NOTE — Patient Instructions (Addendum)
Spirometry/DLCO performed today. 

## 2023-09-05 ENCOUNTER — Ambulatory Visit: Payer: Medicare Other | Admitting: Gastroenterology

## 2023-09-12 ENCOUNTER — Telehealth: Payer: Self-pay | Admitting: Internal Medicine

## 2023-09-12 DIAGNOSIS — R051 Acute cough: Secondary | ICD-10-CM

## 2023-09-12 MED ORDER — DOXYCYCLINE HYCLATE 100 MG PO TABS
100.0000 mg | ORAL_TABLET | Freq: Every day | ORAL | 0 refills | Status: DC
Start: 2023-09-12 — End: 2023-10-01

## 2023-09-12 MED ORDER — PREDNISONE 10 MG PO TABS
ORAL_TABLET | ORAL | 0 refills | Status: AC
Start: 2023-09-12 — End: 2023-09-17

## 2023-09-12 NOTE — Telephone Encounter (Signed)
Spoke with patient regarding prior message. Per Dr.Ramaswamy ok to send in   Take doxycycline 100mg  po twice daily x 5 days; take after meals and avoid sunlight     Please take prednisone 40 mg x1 day, then 30 mg x1 day, then 20 mg x1 day, then 10 mg x1 day, and then 5 mg x1 day and stop   Patient's voice was understanding .Nothing else further needed.

## 2023-09-12 NOTE — Telephone Encounter (Signed)
Spoke with patient regarding prior message. PT has some "crud" states she has been taking Musinex for a week and it is breaking up a little  but still green. Please call to advise. Feels she may need an Antibx.        Patient states she been having a cough for 5-6 days no fever and having green mucus . Patient stated she has been taking Mucinex is helping some.   Dr.Ramaswamy can you please advise

## 2023-09-12 NOTE — Telephone Encounter (Signed)
PT has some "crud" states she has been taking Musinex for a week and it is breaking up a little  but still green. Please call to advise. Feels she may need an Antibx.    [phone]

## 2023-09-12 NOTE — Telephone Encounter (Signed)
   Take doxycycline 100mg  po twice daily x 5 days; take after meals and avoid sunlight   Please take prednisone 40 mg x1 day, then 30 mg x1 day, then 20 mg x1 day, then 10 mg x1 day, and then 5 mg x1 day and stop   Allergies  Allergen Reactions   Cefazolin Hives and Itching    Other reaction(s): severe hives   Pseudoephedrine Hcl     Other reaction(s): palpitations (moderate)   Ergotamine     unknown   Other Other (See Comments)   Pentobarbital     unknown   Pirfenidone     unknown   Caffeine Palpitations

## 2023-09-17 DIAGNOSIS — R278 Other lack of coordination: Secondary | ICD-10-CM | POA: Diagnosis not present

## 2023-09-17 DIAGNOSIS — M6281 Muscle weakness (generalized): Secondary | ICD-10-CM | POA: Diagnosis not present

## 2023-09-17 DIAGNOSIS — M545 Low back pain, unspecified: Secondary | ICD-10-CM | POA: Diagnosis not present

## 2023-09-17 DIAGNOSIS — R2681 Unsteadiness on feet: Secondary | ICD-10-CM | POA: Diagnosis not present

## 2023-09-24 DIAGNOSIS — M6281 Muscle weakness (generalized): Secondary | ICD-10-CM | POA: Diagnosis not present

## 2023-09-24 DIAGNOSIS — R2681 Unsteadiness on feet: Secondary | ICD-10-CM | POA: Diagnosis not present

## 2023-09-24 DIAGNOSIS — R278 Other lack of coordination: Secondary | ICD-10-CM | POA: Diagnosis not present

## 2023-09-24 DIAGNOSIS — M545 Low back pain, unspecified: Secondary | ICD-10-CM | POA: Diagnosis not present

## 2023-09-29 ENCOUNTER — Telehealth: Payer: Self-pay | Admitting: Internal Medicine

## 2023-09-29 DIAGNOSIS — M545 Low back pain, unspecified: Secondary | ICD-10-CM | POA: Diagnosis not present

## 2023-09-29 DIAGNOSIS — M6281 Muscle weakness (generalized): Secondary | ICD-10-CM | POA: Diagnosis not present

## 2023-09-29 DIAGNOSIS — R278 Other lack of coordination: Secondary | ICD-10-CM | POA: Diagnosis not present

## 2023-09-29 DIAGNOSIS — R2681 Unsteadiness on feet: Secondary | ICD-10-CM | POA: Diagnosis not present

## 2023-09-29 NOTE — Telephone Encounter (Signed)
Patient would like to talk to nurse. Would not leave a message. Patient phone number is [phone].

## 2023-09-29 NOTE — Telephone Encounter (Signed)
Pulmonary function test might be declining.  Okay for seeing on a 15-minute slot but do tell her that it is a 15-minute slot and I will not have much time and therefore it history very focused visit.      Latest Ref Rng & Units 09/04/2023    4:05 PM 09/26/2021    2:58 PM 05/07/2021    3:12 PM 08/10/2020   10:03 AM 07/19/2019    3:20 PM 02/24/2017   11:58 AM  PFT Results  FVC-Pre L 2.14  P 2.38  2.53  2.57  2.74  2.58   FVC-Predicted Pre % 73  P 79  84  84  93  85   FVC-Post L     2.63  2.45   FVC-Predicted Post %     89  81   Pre FEV1/FVC % % 84  P 87  86  86  86  82   Post FEV1/FCV % %     90  87   FEV1-Pre L 1.80  P 2.06  2.17  2.22  2.36  2.12   FEV1-Predicted Pre % 83  P 92  97  97  107  93   FEV1-Post L     2.35  2.14   DLCO uncorrected ml/min/mmHg 12.22  P 9.76  15.50  15.83  16.52  16.15   DLCO UNC% % 59  P 47  74  76  81  59   DLCO corrected ml/min/mmHg 12.22  P 9.48  15.50  15.34   14.93   DLCO COR %Predicted % 59  P 45  74  73   55   DLVA Predicted % 98  P 77  105  97  104  79   TLC L    4.50  3.97    TLC % Predicted %    81  74    RV % Predicted %    71  52      P Preliminary result

## 2023-09-29 NOTE — Telephone Encounter (Signed)
Spoke with the pt  She is asking if MR rec that she get a pneumovax- last given prevnar 13 in 2016   She also asked about getting the covid booster- had reaction in the past to covid vax "it made my heart go crazy"  Lastly, she is overdue f/u and there is no where to put her  She had PFT 09/04/23  Could you see her in a held or 15 min slot? She states will only see you and not APP.

## 2023-09-30 DIAGNOSIS — J8410 Pulmonary fibrosis, unspecified: Secondary | ICD-10-CM | POA: Diagnosis not present

## 2023-09-30 DIAGNOSIS — I2510 Atherosclerotic heart disease of native coronary artery without angina pectoris: Secondary | ICD-10-CM | POA: Diagnosis not present

## 2023-09-30 NOTE — Telephone Encounter (Signed)
Called pt, no answer. Lvmm for pt to give the office a call back. Offered pt a 15 minute visit on 10/01/23 @ 11:15am.

## 2023-10-01 ENCOUNTER — Ambulatory Visit: Payer: Medicare Other | Admitting: Internal Medicine

## 2023-10-01 ENCOUNTER — Telehealth: Payer: Self-pay | Admitting: Internal Medicine

## 2023-10-01 ENCOUNTER — Encounter: Payer: Self-pay | Admitting: Internal Medicine

## 2023-10-01 VITALS — BP 108/66 | HR 80 | Temp 97.9°F | Ht 67.0 in | Wt 258.0 lb

## 2023-10-01 DIAGNOSIS — J209 Acute bronchitis, unspecified: Secondary | ICD-10-CM

## 2023-10-01 DIAGNOSIS — J84112 Idiopathic pulmonary fibrosis: Secondary | ICD-10-CM

## 2023-10-01 DIAGNOSIS — Z23 Encounter for immunization: Secondary | ICD-10-CM

## 2023-10-01 DIAGNOSIS — Z1231 Encounter for screening mammogram for malignant neoplasm of breast: Secondary | ICD-10-CM | POA: Diagnosis not present

## 2023-10-01 MED ORDER — AZITHROMYCIN 250 MG PO TABS: 250.0000 mg | ORAL_TABLET | ORAL | 0 refills | Status: DC

## 2023-10-01 NOTE — Telephone Encounter (Signed)
   Forgot in visit 10/01/2023 - has mild green sputum since today. Othewe ok. REfused pred  Plan - Take doxycycline 100mg  po twice daily x 5 days; take after meals and avoid sunlight OR  - z pak   Allergies  Allergen Reactions   Cefazolin Hives and Itching    Other reaction(s): severe hives   Pseudoephedrine Hcl     Other reaction(s): palpitations (moderate)   Ergotamine     unknown   Other Other (See Comments)   Pentobarbital     unknown   Pirfenidone     unknown   Caffeine Palpitations

## 2023-10-01 NOTE — Telephone Encounter (Signed)
Spoke with the pt  I have sent her zpack  Nothing further needed

## 2023-10-01 NOTE — Patient Instructions (Addendum)
IPF (idiopathic pulmonary fibrosis) (HCC)   - Fibrosis is progressive 15% decline in 2 years - now currently not on standard of care Rx due to prior side effect and personal choice   PLAN High dose flu shot 10/01/2023 - Prevnar-21, RSV, covid vacciness on your own - cancel HRCT In Nov 2024 - take consent form for [employer] study  - if you do this you will get a research CT   - wil look at enrolment in Q1 2025   - do antibiotics   Followup - 4 months after  spirometry and dlco and CT - return for 15 min visith wth DR Marchelle Gearing in 4 months but after PFT and CT  - walk test and symptoms questionnaire at followup  - will cancel This SOC appt in research

## 2023-10-01 NOTE — Progress Notes (Signed)
[address] [place] [address] PCP Caryl Never, Elberta Fortis, MD Patient Care Team: Kristian Covey, MD as PCP - General (Family Medicine) Thurmon Fair, MD as PCP - Cardiology (Cardiology) Freddy Finner, MD (Inactive) as Consulting Physician (Obstetrics and Gynecology) Sharrell Ku, MD as Consulting Physician (Gastroenterology) Croitoru, Rachelle Hora, MD as Consulting Physician (Cardiology) Ihor Gully, MD (Inactive) as Consulting Physician (Urology) Arminda Resides, MD as Consulting Physician (Dermatology) Kalman Shan, MD as Consulting Physician (Pulmonary Disease) Pollyann Savoy, MD as Consulting Physician (Rheumatology) Antonietta Barcelona, OD as Referring  Physician (Optometry) Sanjuana Letters, MD as Referring Physician (Orthopedic Surgery)  This Provider for this visit: Treatment Team:  Attending Provider: Kalman Shan, MD    07/22/2023 -   Chief Complaint  Patient presents with   Follow-up    F/up      HPI [name] [name] 82 y.o. -returns for IPF follow-up.  She is in supportive care.  Last pulmonary function test was in October 2022 almost 2 years ago.  Last CT scan of the chest in June 2023 just over a year ago.  Last visit was in January 2024.  Overall she says she is stable.  She is not taking any antifibrotic's.  She says her atrial fibrillation is in control/sinus rhythm.  She did see Dr. C8/15/24.  She was in the ED in July 2024 for dizziness and an in the ER and office visit April May 2024 for osteoarthritis of the right hip.  Frequent falls.   OV 10/01/2023  Subjective:  [name], female , DOB: 03-24-1941 , age 82 y.o. , MRN: [id number] , ADDRESS: [address] [place] [address] PCP Kristian Covey, MD Patient Care Team: Kristian Covey, MD as PCP - General (Family Medicine) Croitoru, Rachelle Hora, MD as PCP - Cardiology (Cardiology) Freddy Finner, MD (Inactive) as Consulting Physician (Obstetrics and Gynecology) Sharrell Ku, MD as Consulting Physician (Gastroenterology) Croitoru, Rachelle Hora, MD as Consulting Physician (Cardiology) Ihor Gully, MD (Inactive) as Consulting Physician (Urology) Arminda Resides, MD as Consulting Physician (Dermatology) Kalman Shan, MD as Consulting Physician (Pulmonary Disease) Pollyann Savoy, MD as Consulting Physician (Rheumatology) Antonietta Barcelona, OD as Referring Physician (Optometry) Sanjuana Letters, MD as Referring Physician (Orthopedic Surgery)  This Provider for this visit: Treatment Team:  Attending Provider: Kalman Shan, MD    10/01/2023 -   Chief Complaint  Patient presents with   Follow-up    Breathing has  been gradually getting worse since the last visit. She has cough with clear to green sputum.        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 Max dose 2 pilll tid since Dec 02, 2020 -> intermittent start/stop summer 2022 -> stopped aug 2022  - OFEV start Nov 2022  - low dose -> stopped 2023 due to sid effects   - LAST HRT June 2023   Nocutruanl hyoxeia   - Overnight pulse oximetry done on 03/12/2021 shows time spent less than equal to 88% is 40.3 minutes.  Average pulse was 60/min.  Time spent  @Patient  ID: [name] [name], female    DOB: Jul 30, 1941, 82 y.o.   MRN: [id number]  Chief Complaint  Patient presents with   Follow-up    Pt. Says she's had some bleeding.     Referring provider: Sigmund Hazel, MD  HPI: 81 year old female former smoker quit in 1971.  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 Patient of Dr. Marchelle Gearing, last seen by pulmonary NP on 04/10/21.   Previous [facility] pulmonary encounter: 04/10/2021 follow-up; Bronchitis, Pulmonary fibrosis Patient returns for a follow-up visit, patient was seen last week for cough, congestion, shortness of breath and chills.  COVID-19 testing was negative.  Patient had been fully vaccinated for COVID-19.  She had no increased oxygen demands.  Patient was given doxycycline for 7 days and prednisone 20 mg for 5 days. Patient complains ongoing cough and congestion , mucus remains green on/off. No fever.  Eating well , no n/v.d. No hemoptysis , orthopnea or edema.  Called in over the weekend and was called in steroid burst. Currently on Prednisone 40mg  daily .  Chest xray showed chronic changes with no acute process.  Lives at [facility] [place] , drives.   04/17/2021 Patient presents today follow-up with PFTs. She did not have PFTs done today because she is still not feeling well. She completed course of doxycycline and has two days left of prednisone. Reports "cold symptoms" described as sinus drainage and cough. Sputum has not real purulence. Energy level is low. States that she feels her head is not right. She is using Flonase ans Claritin. She is short of breath. She completed doxycycline twice with no improvement. She is taking mucinex twice a day. She has not used albuterol rescue inhaler. Her ex husband died two weeks ago, she found out two days ago. Denies depressed feelings.    05/15/2021 -  Dr. Marchelle Gearing Type of  visit: Telephone/Video [name] [name] 82 y.o. - changed face to face visit to telephone visit due to > 74F heat. Wantd to know PFT resuts June 2022 copared to sept 2021. FVC down 1.6%, DLCO down 2.1% (compared to y  2years ago FVC declined 7.7%). On Esbeit since Dec 2021/Jan 2022.  Says Dr C sarted on her ditliazem 04/18/21 and after that esbriet became more intolerable. She had constipationa and retain fluids.  So, she stopped all her medications except  eliquis and anti depressant and anti histamine. This means she stopped esbriet approx a week ago. Says that for several days still not feel better though better today. She is unsure if today is a good day v bad day. But overall feels A Fib is acting up. She wants to restarrtrt esbriet after good w wash out.   She is now using o2 at night but feels is not helping energy levels at day time. I Advised she could return it and then she said is actually helping her and does not want to return it.    08/14/21- Dr. Marchelle Gearing Chief Complaint  Patient presents with   Follow-up    Pt states she has been doing okay since last visit. Still becomes SOB with exertion.     [name] [name] 82 y.o. -returns for follow-up.  Since her last visit because she was having calls with side effects from pirfenidone.  She stopped it.  She says after that she started feeling better.  Despite this she is reporting progressive  [address] [place] [address] PCP Caryl Never, Elberta Fortis, MD Patient Care Team: Kristian Covey, MD as PCP - General (Family Medicine) Thurmon Fair, MD as PCP - Cardiology (Cardiology) Freddy Finner, MD (Inactive) as Consulting Physician (Obstetrics and Gynecology) Sharrell Ku, MD as Consulting Physician (Gastroenterology) Croitoru, Rachelle Hora, MD as Consulting Physician (Cardiology) Ihor Gully, MD (Inactive) as Consulting Physician (Urology) Arminda Resides, MD as Consulting Physician (Dermatology) Kalman Shan, MD as Consulting Physician (Pulmonary Disease) Pollyann Savoy, MD as Consulting Physician (Rheumatology) Antonietta Barcelona, OD as Referring  Physician (Optometry) Sanjuana Letters, MD as Referring Physician (Orthopedic Surgery)  This Provider for this visit: Treatment Team:  Attending Provider: Kalman Shan, MD    07/22/2023 -   Chief Complaint  Patient presents with   Follow-up    F/up      HPI [name] [name] 82 y.o. -returns for IPF follow-up.  She is in supportive care.  Last pulmonary function test was in October 2022 almost 2 years ago.  Last CT scan of the chest in June 2023 just over a year ago.  Last visit was in January 2024.  Overall she says she is stable.  She is not taking any antifibrotic's.  She says her atrial fibrillation is in control/sinus rhythm.  She did see Dr. C8/15/24.  She was in the ED in July 2024 for dizziness and an in the ER and office visit April May 2024 for osteoarthritis of the right hip.  Frequent falls.   OV 10/01/2023  Subjective:  [name], female , DOB: 03-24-1941 , age 82 y.o. , MRN: [id number] , ADDRESS: [address] [place] [address] PCP Kristian Covey, MD Patient Care Team: Kristian Covey, MD as PCP - General (Family Medicine) Croitoru, Rachelle Hora, MD as PCP - Cardiology (Cardiology) Freddy Finner, MD (Inactive) as Consulting Physician (Obstetrics and Gynecology) Sharrell Ku, MD as Consulting Physician (Gastroenterology) Croitoru, Rachelle Hora, MD as Consulting Physician (Cardiology) Ihor Gully, MD (Inactive) as Consulting Physician (Urology) Arminda Resides, MD as Consulting Physician (Dermatology) Kalman Shan, MD as Consulting Physician (Pulmonary Disease) Pollyann Savoy, MD as Consulting Physician (Rheumatology) Antonietta Barcelona, OD as Referring Physician (Optometry) Sanjuana Letters, MD as Referring Physician (Orthopedic Surgery)  This Provider for this visit: Treatment Team:  Attending Provider: Kalman Shan, MD    10/01/2023 -   Chief Complaint  Patient presents with   Follow-up    Breathing has  been gradually getting worse since the last visit. She has cough with clear to green sputum.        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 Max dose 2 pilll tid since Dec 02, 2020 -> intermittent start/stop summer 2022 -> stopped aug 2022  - OFEV start Nov 2022  - low dose -> stopped 2023 due to sid effects   - LAST HRT June 2023   Nocutruanl hyoxeia   - Overnight pulse oximetry done on 03/12/2021 shows time spent less than equal to 88% is 40.3 minutes.  Average pulse was 60/min.  Time spent  decline in dyspnea.  Dyspnea score is listed below.  She uses oxygen with exertion as needed.  I at night she uses CPAP without oxygen.  She is frustrated with pirfenidone.  She asked about the alternative nintedanib.  She recollects that there constraints with it in the setting of heart disease and anticoagulation.  Explained that one of the study showed 0.5% increase incidence and MI with nintedanib.  But not the other studies.  Explained that surgical mechanism of anticoagulation but clinically patient have tolerated.  Explained about GI side effects  particularly diarrhea.  She is nervous and hesitant but finally we took a shared decision making that she would start it at a lower dose given the fact it can be beneficial in reducing risk of progression.  We also discussed various aspects of clinical trials documented below.  She is interested in this as long as it is a medication with a much better side effect profile.  But at this point in time we took a shared decision making to go with standard of care therapy first.  She will have the flu shot today.  She is very nervous about getting mRNA COVID booster because of the side effect profile.  She had significant side effects apparently.  She also had omicron COVID in January 2022 and she is wondering about natural immunity.  However she is aware is COVID everywhere currently.  We took a shared decision making to check IgG antibody levels and adjust the risk threshold accordingly.  09/18/2021- Interim hx  Patient presents today for acute visit for medication reaction to OFEV. Patient called our office yesterday with reports of rectal bleeding and abdominal pain since starting 09/17/21. She is newly on OFEV since 10/13 and is also on Eliquis. Our office advised her to stop OFEV. Rectal bleeding has since resolved, she still has some slight lower abdomen discomfort described as "bloating".  Breathing wise she is stable. She uses POC 2L with exertion as needed.  She is on oxygen at night with her CPAP. Denies f/c/s, diarrhea, nausea or vomitting.     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  Small pulmonary nodules measuring 5 mm or less stable dating back to 2018 consistent with a benign etiology.  OV 09/26/2021  Subjective:  [name], female , DOB: Dec 10, 1940 , age 45 y.o. , MRN: [id number] , ADDRESS: [address] [place] [address] PCP Sigmund Hazel, MD Patient Care Team: Sigmund Hazel, MD as  PCP - General (Family Medicine) Thurmon Fair, MD as PCP - Cardiology (Cardiology) Freddy Finner, MD as Consulting Physician (Obstetrics and Gynecology) Sharrell Ku, MD as Consulting Physician (Gastroenterology) Croitoru, Rachelle Hora, MD as Consulting Physician (Cardiology) Ihor Gully, MD (Inactive) as Consulting Physician (Urology) Arminda Resides, MD as Consulting Physician (Dermatology) Kalman Shan, MD as Consulting Physician (Pulmonary Disease) Pollyann Savoy, MD as Consulting Physician (Rheumatology) Antonietta Barcelona, OD as Referring Physician (Optometry) Sanjuana Letters, MD as Referring Physician (Orthopedic Surgery)  This Provider for this visit: Treatment Team:  Attending Provider: Kalman Shan, MD    09/26/2021 -   Chief Complaint  Patient presents with   Follow-up    PFT performed today.  Pt states she has been doing okay since last visit.   Follow-up IPF   - sept 09, 2021 update - IPF dx givien: age greater than 42, only trace positive ANAdescription of small hiatal hernia on CT scan, the  @Patient  ID: [name] [name], female    DOB: Jul 30, 1941, 82 y.o.   MRN: [id number]  Chief Complaint  Patient presents with   Follow-up    Pt. Says she's had some bleeding.     Referring provider: Sigmund Hazel, MD  HPI: 81 year old female former smoker quit in 1971.  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 Patient of Dr. Marchelle Gearing, last seen by pulmonary NP on 04/10/21.   Previous [facility] pulmonary encounter: 04/10/2021 follow-up; Bronchitis, Pulmonary fibrosis Patient returns for a follow-up visit, patient was seen last week for cough, congestion, shortness of breath and chills.  COVID-19 testing was negative.  Patient had been fully vaccinated for COVID-19.  She had no increased oxygen demands.  Patient was given doxycycline for 7 days and prednisone 20 mg for 5 days. Patient complains ongoing cough and congestion , mucus remains green on/off. No fever.  Eating well , no n/v.d. No hemoptysis , orthopnea or edema.  Called in over the weekend and was called in steroid burst. Currently on Prednisone 40mg  daily .  Chest xray showed chronic changes with no acute process.  Lives at [facility] [place] , drives.   04/17/2021 Patient presents today follow-up with PFTs. She did not have PFTs done today because she is still not feeling well. She completed course of doxycycline and has two days left of prednisone. Reports "cold symptoms" described as sinus drainage and cough. Sputum has not real purulence. Energy level is low. States that she feels her head is not right. She is using Flonase ans Claritin. She is short of breath. She completed doxycycline twice with no improvement. She is taking mucinex twice a day. She has not used albuterol rescue inhaler. Her ex husband died two weeks ago, she found out two days ago. Denies depressed feelings.    05/15/2021 -  Dr. Marchelle Gearing Type of  visit: Telephone/Video [name] [name] 82 y.o. - changed face to face visit to telephone visit due to > 74F heat. Wantd to know PFT resuts June 2022 copared to sept 2021. FVC down 1.6%, DLCO down 2.1% (compared to y  2years ago FVC declined 7.7%). On Esbeit since Dec 2021/Jan 2022.  Says Dr C sarted on her ditliazem 04/18/21 and after that esbriet became more intolerable. She had constipationa and retain fluids.  So, she stopped all her medications except  eliquis and anti depressant and anti histamine. This means she stopped esbriet approx a week ago. Says that for several days still not feel better though better today. She is unsure if today is a good day v bad day. But overall feels A Fib is acting up. She wants to restarrtrt esbriet after good w wash out.   She is now using o2 at night but feels is not helping energy levels at day time. I Advised she could return it and then she said is actually helping her and does not want to return it.    08/14/21- Dr. Marchelle Gearing Chief Complaint  Patient presents with   Follow-up    Pt states she has been doing okay since last visit. Still becomes SOB with exertion.     [name] [name] 82 y.o. -returns for follow-up.  Since her last visit because she was having calls with side effects from pirfenidone.  She stopped it.  She says after that she started feeling better.  Despite this she is reporting progressive  [address] [place] [address] PCP Caryl Never, Elberta Fortis, MD Patient Care Team: Kristian Covey, MD as PCP - General (Family Medicine) Thurmon Fair, MD as PCP - Cardiology (Cardiology) Freddy Finner, MD (Inactive) as Consulting Physician (Obstetrics and Gynecology) Sharrell Ku, MD as Consulting Physician (Gastroenterology) Croitoru, Rachelle Hora, MD as Consulting Physician (Cardiology) Ihor Gully, MD (Inactive) as Consulting Physician (Urology) Arminda Resides, MD as Consulting Physician (Dermatology) Kalman Shan, MD as Consulting Physician (Pulmonary Disease) Pollyann Savoy, MD as Consulting Physician (Rheumatology) Antonietta Barcelona, OD as Referring  Physician (Optometry) Sanjuana Letters, MD as Referring Physician (Orthopedic Surgery)  This Provider for this visit: Treatment Team:  Attending Provider: Kalman Shan, MD    07/22/2023 -   Chief Complaint  Patient presents with   Follow-up    F/up      HPI [name] [name] 82 y.o. -returns for IPF follow-up.  She is in supportive care.  Last pulmonary function test was in October 2022 almost 2 years ago.  Last CT scan of the chest in June 2023 just over a year ago.  Last visit was in January 2024.  Overall she says she is stable.  She is not taking any antifibrotic's.  She says her atrial fibrillation is in control/sinus rhythm.  She did see Dr. C8/15/24.  She was in the ED in July 2024 for dizziness and an in the ER and office visit April May 2024 for osteoarthritis of the right hip.  Frequent falls.   OV 10/01/2023  Subjective:  [name], female , DOB: 03-24-1941 , age 82 y.o. , MRN: [id number] , ADDRESS: [address] [place] [address] PCP Kristian Covey, MD Patient Care Team: Kristian Covey, MD as PCP - General (Family Medicine) Croitoru, Rachelle Hora, MD as PCP - Cardiology (Cardiology) Freddy Finner, MD (Inactive) as Consulting Physician (Obstetrics and Gynecology) Sharrell Ku, MD as Consulting Physician (Gastroenterology) Croitoru, Rachelle Hora, MD as Consulting Physician (Cardiology) Ihor Gully, MD (Inactive) as Consulting Physician (Urology) Arminda Resides, MD as Consulting Physician (Dermatology) Kalman Shan, MD as Consulting Physician (Pulmonary Disease) Pollyann Savoy, MD as Consulting Physician (Rheumatology) Antonietta Barcelona, OD as Referring Physician (Optometry) Sanjuana Letters, MD as Referring Physician (Orthopedic Surgery)  This Provider for this visit: Treatment Team:  Attending Provider: Kalman Shan, MD    10/01/2023 -   Chief Complaint  Patient presents with   Follow-up    Breathing has  been gradually getting worse since the last visit. She has cough with clear to green sputum.        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 Max dose 2 pilll tid since Dec 02, 2020 -> intermittent start/stop summer 2022 -> stopped aug 2022  - OFEV start Nov 2022  - low dose -> stopped 2023 due to sid effects   - LAST HRT June 2023   Nocutruanl hyoxeia   - Overnight pulse oximetry done on 03/12/2021 shows time spent less than equal to 88% is 40.3 minutes.  Average pulse was 60/min.  Time spent  decline in dyspnea.  Dyspnea score is listed below.  She uses oxygen with exertion as needed.  I at night she uses CPAP without oxygen.  She is frustrated with pirfenidone.  She asked about the alternative nintedanib.  She recollects that there constraints with it in the setting of heart disease and anticoagulation.  Explained that one of the study showed 0.5% increase incidence and MI with nintedanib.  But not the other studies.  Explained that surgical mechanism of anticoagulation but clinically patient have tolerated.  Explained about GI side effects  particularly diarrhea.  She is nervous and hesitant but finally we took a shared decision making that she would start it at a lower dose given the fact it can be beneficial in reducing risk of progression.  We also discussed various aspects of clinical trials documented below.  She is interested in this as long as it is a medication with a much better side effect profile.  But at this point in time we took a shared decision making to go with standard of care therapy first.  She will have the flu shot today.  She is very nervous about getting mRNA COVID booster because of the side effect profile.  She had significant side effects apparently.  She also had omicron COVID in January 2022 and she is wondering about natural immunity.  However she is aware is COVID everywhere currently.  We took a shared decision making to check IgG antibody levels and adjust the risk threshold accordingly.  09/18/2021- Interim hx  Patient presents today for acute visit for medication reaction to OFEV. Patient called our office yesterday with reports of rectal bleeding and abdominal pain since starting 09/17/21. She is newly on OFEV since 10/13 and is also on Eliquis. Our office advised her to stop OFEV. Rectal bleeding has since resolved, she still has some slight lower abdomen discomfort described as "bloating".  Breathing wise she is stable. She uses POC 2L with exertion as needed.  She is on oxygen at night with her CPAP. Denies f/c/s, diarrhea, nausea or vomitting.     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  Small pulmonary nodules measuring 5 mm or less stable dating back to 2018 consistent with a benign etiology.  OV 09/26/2021  Subjective:  [name], female , DOB: Dec 10, 1940 , age 45 y.o. , MRN: [id number] , ADDRESS: [address] [place] [address] PCP Sigmund Hazel, MD Patient Care Team: Sigmund Hazel, MD as  PCP - General (Family Medicine) Thurmon Fair, MD as PCP - Cardiology (Cardiology) Freddy Finner, MD as Consulting Physician (Obstetrics and Gynecology) Sharrell Ku, MD as Consulting Physician (Gastroenterology) Croitoru, Rachelle Hora, MD as Consulting Physician (Cardiology) Ihor Gully, MD (Inactive) as Consulting Physician (Urology) Arminda Resides, MD as Consulting Physician (Dermatology) Kalman Shan, MD as Consulting Physician (Pulmonary Disease) Pollyann Savoy, MD as Consulting Physician (Rheumatology) Antonietta Barcelona, OD as Referring Physician (Optometry) Sanjuana Letters, MD as Referring Physician (Orthopedic Surgery)  This Provider for this visit: Treatment Team:  Attending Provider: Kalman Shan, MD    09/26/2021 -   Chief Complaint  Patient presents with   Follow-up    PFT performed today.  Pt states she has been doing okay since last visit.   Follow-up IPF   - sept 09, 2021 update - IPF dx givien: age greater than 42, only trace positive ANAdescription of small hiatal hernia on CT scan, the  MD    10/10/2021 -   Chief Complaint  Patient presents with   Follow-up    Patient states that  she is doing good with the Ofev and is currently not having any side effects. States that she has been coughing a lot lately. Wants to know if she is due for pneumonia vaccine.   Type of visit: Telephone/Video Circumstance: COVID-19 national emergency Identification of patient AFAF WARNS with [birth year] and MRN [id number] - 2 person identifier Risks: Risks, benefits, limitations of telephone visit explained. Patient understood and verbalized agreement to proceed Anyone else on call: just patient Patient location: her home This provider location: [address], [place], [place] [address]   HPI WAYNE STEFANICK 82 y.o. -at this point telephone visit is to see how rechallenge with nintedanib is ongoing.  Since 10/06/2021 Saturday she is on 100 mg twice daily.  She says she has not had any recurrence of bleeding.  No GI symptoms.  Tolerating it well.  Dyspnea is not any worse.  No new issues.  Noticed mild reduction in GFR in October 2022.  Liver function test and hemoglobin are fine.             OV 11/09/2021  Subjective:  [name], female , DOB: 30-Jun-1941 , age 29 y.o.  , MRN: [id number] , ADDRESS: [address] [place] [address] PCP Sigmund Hazel, MD Patient Care Team: Sigmund Hazel, MD as PCP - General (Family Medicine) Croitoru, Rachelle Hora, MD as PCP - Cardiology (Cardiology) Freddy Finner, MD as Consulting Physician (Obstetrics and Gynecology) Sharrell Ku, MD as Consulting Physician (Gastroenterology) Croitoru, Rachelle Hora, MD as Consulting Physician (Cardiology) Ihor Gully, MD (Inactive) as Consulting Physician (Urology) Arminda Resides, MD as Consulting Physician (Dermatology) Kalman Shan, MD as Consulting Physician (Pulmonary Disease) Pollyann Savoy, MD as Consulting Physician (Rheumatology) Antonietta Barcelona, OD as Referring Physician (Optometry) Sanjuana Letters, MD as Referring Physician (Orthopedic Surgery)  This Provider for this visit: Treatment Team:  Attending Provider: Kalman Shan, MD    11/09/2021 -   Chief Complaint  Patient presents with   Follow-up    Pt states she is about the same since last visit. Still becomes SOB with exertion.    HPI MUSETTE SIMENTAL 82 y.o. -returns for follow-up.  She restarted nintedanib on 10/06/2021 at the lower dose.  She tells me that she has not had any rectal bleeding except yesterday there was again a few drops.  No abdominal pain.  Last visit I told her to go see GI doctor but she has not.  Her GI doctor was Dr. Matthias Hughs in [facility] but has since retired.  Advised that that I will make a referral.  In terms of shortness of breath she is stable.  She believes she is tolerating the nintedanib fine.  She is actually gained some weight.  She also reports that for the last 2 weeks she is on metoprolol because of cardiac rhythm issues.  At today blood pressure was 136 systolic.Marland Kitchen  She tells me this is high for her.  She has never had this problem before according to history.  But the observations only today.  She acknowledges that this could be whitecoat hypertension as well.  Last  CT scan of the chest July 2021 Last pulmonary function test October 2022  She is not fully compliant with the portable oxygen.     No results found.     OV 05/31/2022  Subjective:  Patient ID:  [address] [place] [address] PCP Caryl Never, Elberta Fortis, MD Patient Care Team: Kristian Covey, MD as PCP - General (Family Medicine) Thurmon Fair, MD as PCP - Cardiology (Cardiology) Freddy Finner, MD (Inactive) as Consulting Physician (Obstetrics and Gynecology) Sharrell Ku, MD as Consulting Physician (Gastroenterology) Croitoru, Rachelle Hora, MD as Consulting Physician (Cardiology) Ihor Gully, MD (Inactive) as Consulting Physician (Urology) Arminda Resides, MD as Consulting Physician (Dermatology) Kalman Shan, MD as Consulting Physician (Pulmonary Disease) Pollyann Savoy, MD as Consulting Physician (Rheumatology) Antonietta Barcelona, OD as Referring  Physician (Optometry) Sanjuana Letters, MD as Referring Physician (Orthopedic Surgery)  This Provider for this visit: Treatment Team:  Attending Provider: Kalman Shan, MD    07/22/2023 -   Chief Complaint  Patient presents with   Follow-up    F/up      HPI [name] [name] 82 y.o. -returns for IPF follow-up.  She is in supportive care.  Last pulmonary function test was in October 2022 almost 2 years ago.  Last CT scan of the chest in June 2023 just over a year ago.  Last visit was in January 2024.  Overall she says she is stable.  She is not taking any antifibrotic's.  She says her atrial fibrillation is in control/sinus rhythm.  She did see Dr. C8/15/24.  She was in the ED in July 2024 for dizziness and an in the ER and office visit April May 2024 for osteoarthritis of the right hip.  Frequent falls.   OV 10/01/2023  Subjective:  [name], female , DOB: 03-24-1941 , age 82 y.o. , MRN: [id number] , ADDRESS: [address] [place] [address] PCP Kristian Covey, MD Patient Care Team: Kristian Covey, MD as PCP - General (Family Medicine) Croitoru, Rachelle Hora, MD as PCP - Cardiology (Cardiology) Freddy Finner, MD (Inactive) as Consulting Physician (Obstetrics and Gynecology) Sharrell Ku, MD as Consulting Physician (Gastroenterology) Croitoru, Rachelle Hora, MD as Consulting Physician (Cardiology) Ihor Gully, MD (Inactive) as Consulting Physician (Urology) Arminda Resides, MD as Consulting Physician (Dermatology) Kalman Shan, MD as Consulting Physician (Pulmonary Disease) Pollyann Savoy, MD as Consulting Physician (Rheumatology) Antonietta Barcelona, OD as Referring Physician (Optometry) Sanjuana Letters, MD as Referring Physician (Orthopedic Surgery)  This Provider for this visit: Treatment Team:  Attending Provider: Kalman Shan, MD    10/01/2023 -   Chief Complaint  Patient presents with   Follow-up    Breathing has  been gradually getting worse since the last visit. She has cough with clear to green sputum.        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 Max dose 2 pilll tid since Dec 02, 2020 -> intermittent start/stop summer 2022 -> stopped aug 2022  - OFEV start Nov 2022  - low dose -> stopped 2023 due to sid effects   - LAST HRT June 2023   Nocutruanl hyoxeia   - Overnight pulse oximetry done on 03/12/2021 shows time spent less than equal to 88% is 40.3 minutes.  Average pulse was 60/min.  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 Max dose 2 pilll tid since Dec 02, 2020 -> intermittent start/stop summer 2022 -> stopped aug 2022  - Overnight pulse oximetry done on 03/12/2021 shows time spent less than equal to 88% is 40.3 minutes.  Average pulse was 60/min.  Time spent in bradycardia 72.5%. Start 2 L nasal cannula at night  Multiple Lung Nodules 7mm- stable Oct 11, 2017 -> July 2021 Alpha-1 MZ but does not have emphysema on CT 10/11/2020.  [Daughter died from alpha-1 CC] Obesity  Diastolic dysfunction Atrial fibrillation on Eliquis Obstructive sleep apnea on CPAP without oxygen COVID-19 incidental 12/28/2020 - likely Omicron4  HPI CAMIRA SHIRAR 82 y.o.  -presents ith daughter ? Marijo File meeting first time. Doing stable from symptom stand point resp wise but PFT down significantly. Took ofev for 5 days and there are aspectso of taking ofev (100mg  bid - the lower dose) tht she liked. She felt her urinary stream had better flow (not a known side effect). She had lower appetite (known Se with ofev) but she liked the curb on appetie it due to her obesity. The 5d into she had had mild discomfort in suprapubic area (known SE for abd discmofort) and had 1 drop of painless rectal bleed. So stopped ofev (also on eliquisl bleeding theoertical risk with ofev). After stopping ofev, urinary flow is back to baseline(does not like it), and no mpre rectal bleeding . She never had rectal bleeding before and afer. She again stated and made clear the so called bleeding was "1 drop of blood ont he floor". Associted with ofev intake on background of eliquis and associted with suprapubic discomfor. No dysuria. Dr Orlene Plum is her GI. Last colonoscopy was when she ws 74 and was normal apparently  Discussed  - seeing a GI doc for rectal bleed - she is agreeable   - discussed rechallenge with ofev v joining clinical trial phase 3( to be volunteer + exploit therapeutic advantage with 50% chance of placebo) Discussed other asoects of trials. Explained the Oct 11, 1994 Zephrysu study with IV MAB against CTGF Pamrevulumab v placeo.  See below. She and dauhger want her to do clinical trial but are more enthused about the idea of a ofev rechallenge first. We discussed that rechallenge liketly to cause side effects but anecdotally seems some patients who do not have issues. She is not interested in esbriet rechallenge       OV 10/10/2021  Subjective:  [name], female , DOB: 1941-06-25 , age 14 y.o. , MRN: [id number] , ADDRESS: [address] [place] [address] PCP Sigmund Hazel, MD Patient Care Team: Sigmund Hazel, MD as PCP - General (Family  Medicine) Thurmon Fair, MD as PCP - Cardiology (Cardiology) Freddy Finner, MD as Consulting Physician (Obstetrics and Gynecology) Sharrell Ku, MD as Consulting Physician (Gastroenterology) Croitoru, Rachelle Hora, MD as Consulting Physician (Cardiology) Ihor Gully, MD (Inactive) as Consulting Physician (Urology) Arminda Resides, MD as Consulting Physician (Dermatology) Kalman Shan, MD as Consulting Physician (Pulmonary Disease) Pollyann Savoy, MD as Consulting Physician (Rheumatology) Antonietta Barcelona, OD as Referring Physician (Optometry) Sanjuana Letters, MD as Referring Physician (Orthopedic Surgery)  This Provider for this visit: Treatment Team:  Attending Provider: Kalman Shan,  MD    10/10/2021 -   Chief Complaint  Patient presents with   Follow-up    Patient states that  she is doing good with the Ofev and is currently not having any side effects. States that she has been coughing a lot lately. Wants to know if she is due for pneumonia vaccine.   Type of visit: Telephone/Video Circumstance: COVID-19 national emergency Identification of patient AFAF WARNS with [birth year] and MRN [id number] - 2 person identifier Risks: Risks, benefits, limitations of telephone visit explained. Patient understood and verbalized agreement to proceed Anyone else on call: just patient Patient location: her home This provider location: [address], [place], [place] [address]   HPI WAYNE STEFANICK 82 y.o. -at this point telephone visit is to see how rechallenge with nintedanib is ongoing.  Since 10/06/2021 Saturday she is on 100 mg twice daily.  She says she has not had any recurrence of bleeding.  No GI symptoms.  Tolerating it well.  Dyspnea is not any worse.  No new issues.  Noticed mild reduction in GFR in October 2022.  Liver function test and hemoglobin are fine.             OV 11/09/2021  Subjective:  [name], female , DOB: 30-Jun-1941 , age 29 y.o.  , MRN: [id number] , ADDRESS: [address] [place] [address] PCP Sigmund Hazel, MD Patient Care Team: Sigmund Hazel, MD as PCP - General (Family Medicine) Croitoru, Rachelle Hora, MD as PCP - Cardiology (Cardiology) Freddy Finner, MD as Consulting Physician (Obstetrics and Gynecology) Sharrell Ku, MD as Consulting Physician (Gastroenterology) Croitoru, Rachelle Hora, MD as Consulting Physician (Cardiology) Ihor Gully, MD (Inactive) as Consulting Physician (Urology) Arminda Resides, MD as Consulting Physician (Dermatology) Kalman Shan, MD as Consulting Physician (Pulmonary Disease) Pollyann Savoy, MD as Consulting Physician (Rheumatology) Antonietta Barcelona, OD as Referring Physician (Optometry) Sanjuana Letters, MD as Referring Physician (Orthopedic Surgery)  This Provider for this visit: Treatment Team:  Attending Provider: Kalman Shan, MD    11/09/2021 -   Chief Complaint  Patient presents with   Follow-up    Pt states she is about the same since last visit. Still becomes SOB with exertion.    HPI MUSETTE SIMENTAL 82 y.o. -returns for follow-up.  She restarted nintedanib on 10/06/2021 at the lower dose.  She tells me that she has not had any rectal bleeding except yesterday there was again a few drops.  No abdominal pain.  Last visit I told her to go see GI doctor but she has not.  Her GI doctor was Dr. Matthias Hughs in [facility] but has since retired.  Advised that that I will make a referral.  In terms of shortness of breath she is stable.  She believes she is tolerating the nintedanib fine.  She is actually gained some weight.  She also reports that for the last 2 weeks she is on metoprolol because of cardiac rhythm issues.  At today blood pressure was 136 systolic.Marland Kitchen  She tells me this is high for her.  She has never had this problem before according to history.  But the observations only today.  She acknowledges that this could be whitecoat hypertension as well.  Last  CT scan of the chest July 2021 Last pulmonary function test October 2022  She is not fully compliant with the portable oxygen.     No results found.     OV 05/31/2022  Subjective:  Patient ID:  [name] [name], female , DOB: Aug 13, 1941 , age 56 y.o. , MRN: [id number] , ADDRESS: [address] [place] [address] PCP Sigmund Hazel, MD Patient Care Team: Sigmund Hazel, MD as PCP - General (Family Medicine) Thurmon Fair, MD as PCP - Cardiology (Cardiology) Freddy Finner, MD as Consulting Physician (Obstetrics and Gynecology) Sharrell Ku, MD as Consulting Physician (Gastroenterology) Croitoru, Rachelle Hora, MD as Consulting Physician (Cardiology) Ihor Gully, MD (Inactive) as Consulting Physician (Urology) Arminda Resides, MD as Consulting Physician (Dermatology) Kalman Shan, MD as Consulting Physician (Pulmonary Disease) Pollyann Savoy, MD as Consulting Physician (Rheumatology) Antonietta Barcelona, OD as Referring Physician (Optometry) Sanjuana Letters, MD as Referring Physician (Orthopedic Surgery)  This Provider for this visit: Treatment Team:  Attending Provider: Kalman Shan, MD   05/31/2022 -   Chief Complaint  Patient presents with   Follow-up    Pt states she is still having problems with her breathing. Also states that she feels like she is more constipated than before and feels like she might have some internal bleeding.     HPI LEOTA BATTA 82 y.o. -on finger for several months.  She believes IPF is slowly getting worse.  Review of the symptom score shows that symptoms are definitely worse in the last 2 years but compared to 6 months ago around the same.  Shared high-resolution CT chest that shows progression of ILD over 2 years.  I shared the results with her.  This fits in with the symptoms also getting worse over the last 2 years.  She is worried about the progression.  In the past we recommended she consider clinical trial as a care  option but she declined.  This is still under consideration for her but she has not made up her mind.  Last echocardiogram was a few years ago.  Of note she has had rectal bleeding last year.  I referred her to Dr. Carole Binning and GI.  She had virtual colonoscopy.  She asked me to read the report.  It shows she has diverticulosis but does not seem anything hugely abnormal.  She is unsure if she still having rectal bleeding.  She thinks she might be.  She wants to switch GI doctors   She also tells me that since last visit she is on a new medicine for atrial fibrillation.  Tikosyn.  She wanted me to palpate a pulse.  I did this for 30 seconds and told her at least for the 30 seconds she is in sinus rhythm.     OV 09/03/2022  Subjective:  [name], female , DOB: Mar 06, 1941 , age 58 y.o. , MRN: [id number] , ADDRESS: [address] [place] [address] PCP Sigmund Hazel, MD Patient Care Team: Sigmund Hazel, MD as PCP - General (Family Medicine) Thurmon Fair, MD as PCP - Cardiology (Cardiology) Freddy Finner, MD (Inactive) as Consulting Physician (Obstetrics and Gynecology) Sharrell Ku, MD as Consulting Physician (Gastroenterology) Croitoru, Rachelle Hora, MD as Consulting Physician (Cardiology) Ihor Gully, MD (Inactive) as Consulting Physician (Urology) Arminda Resides, MD as Consulting Physician (Dermatology) Kalman Shan, MD as Consulting Physician (Pulmonary Disease) Pollyann Savoy, MD as Consulting Physician (Rheumatology) Antonietta Barcelona, OD as Referring Physician (Optometry) Sanjuana Letters, MD as Referring Physician (Orthopedic Surgery)  This Provider for this visit: Treatment Team:  Attending Provider: Kalman Shan, MD   09/03/2022 -   Chief Complaint  Patient presents with   Follow-up    Breathing is about the  @Patient  ID: [name] [name], female    DOB: Jul 30, 1941, 82 y.o.   MRN: [id number]  Chief Complaint  Patient presents with   Follow-up    Pt. Says she's had some bleeding.     Referring provider: Sigmund Hazel, MD  HPI: 81 year old female former smoker quit in 1971.  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 Patient of Dr. Marchelle Gearing, last seen by pulmonary NP on 04/10/21.   Previous [facility] pulmonary encounter: 04/10/2021 follow-up; Bronchitis, Pulmonary fibrosis Patient returns for a follow-up visit, patient was seen last week for cough, congestion, shortness of breath and chills.  COVID-19 testing was negative.  Patient had been fully vaccinated for COVID-19.  She had no increased oxygen demands.  Patient was given doxycycline for 7 days and prednisone 20 mg for 5 days. Patient complains ongoing cough and congestion , mucus remains green on/off. No fever.  Eating well , no n/v.d. No hemoptysis , orthopnea or edema.  Called in over the weekend and was called in steroid burst. Currently on Prednisone 40mg  daily .  Chest xray showed chronic changes with no acute process.  Lives at [facility] [place] , drives.   04/17/2021 Patient presents today follow-up with PFTs. She did not have PFTs done today because she is still not feeling well. She completed course of doxycycline and has two days left of prednisone. Reports "cold symptoms" described as sinus drainage and cough. Sputum has not real purulence. Energy level is low. States that she feels her head is not right. She is using Flonase ans Claritin. She is short of breath. She completed doxycycline twice with no improvement. She is taking mucinex twice a day. She has not used albuterol rescue inhaler. Her ex husband died two weeks ago, she found out two days ago. Denies depressed feelings.    05/15/2021 -  Dr. Marchelle Gearing Type of  visit: Telephone/Video [name] [name] 82 y.o. - changed face to face visit to telephone visit due to > 74F heat. Wantd to know PFT resuts June 2022 copared to sept 2021. FVC down 1.6%, DLCO down 2.1% (compared to y  2years ago FVC declined 7.7%). On Esbeit since Dec 2021/Jan 2022.  Says Dr C sarted on her ditliazem 04/18/21 and after that esbriet became more intolerable. She had constipationa and retain fluids.  So, she stopped all her medications except  eliquis and anti depressant and anti histamine. This means she stopped esbriet approx a week ago. Says that for several days still not feel better though better today. She is unsure if today is a good day v bad day. But overall feels A Fib is acting up. She wants to restarrtrt esbriet after good w wash out.   She is now using o2 at night but feels is not helping energy levels at day time. I Advised she could return it and then she said is actually helping her and does not want to return it.    08/14/21- Dr. Marchelle Gearing Chief Complaint  Patient presents with   Follow-up    Pt states she has been doing okay since last visit. Still becomes SOB with exertion.     [name] [name] 82 y.o. -returns for follow-up.  Since her last visit because she was having calls with side effects from pirfenidone.  She stopped it.  She says after that she started feeling better.  Despite this she is reporting progressive

## 2023-10-01 NOTE — Telephone Encounter (Signed)
Patient scheduled to be seen today.  Nothing further needed.

## 2023-10-02 DIAGNOSIS — M6281 Muscle weakness (generalized): Secondary | ICD-10-CM | POA: Diagnosis not present

## 2023-10-02 DIAGNOSIS — R2681 Unsteadiness on feet: Secondary | ICD-10-CM | POA: Diagnosis not present

## 2023-10-02 DIAGNOSIS — M545 Low back pain, unspecified: Secondary | ICD-10-CM | POA: Diagnosis not present

## 2023-10-02 DIAGNOSIS — R278 Other lack of coordination: Secondary | ICD-10-CM | POA: Diagnosis not present

## 2023-10-07 ENCOUNTER — Other Ambulatory Visit: Payer: Self-pay | Admitting: Obstetrics and Gynecology

## 2023-10-07 DIAGNOSIS — R928 Other abnormal and inconclusive findings on diagnostic imaging of breast: Secondary | ICD-10-CM

## 2023-10-08 ENCOUNTER — Ambulatory Visit (INDEPENDENT_AMBULATORY_CARE_PROVIDER_SITE_OTHER): Payer: Medicare Other

## 2023-10-08 DIAGNOSIS — I495 Sick sinus syndrome: Secondary | ICD-10-CM

## 2023-10-09 LAB — CUP PACEART REMOTE DEVICE CHECK
Battery Remaining Longevity: 22 mo
Battery Voltage: 2.92 V
Brady Statistic AP VP Percent: 0.39 %
Brady Statistic AP VS Percent: 95.67 %
Brady Statistic AS VP Percent: 0.01 %
Brady Statistic AS VS Percent: 3.93 %
Brady Statistic RA Percent Paced: 95.93 %
Brady Statistic RV Percent Paced: 0.57 %
Date Time Interrogation Session: 20241107090720
Implantable Lead Connection Status: 753985
Implantable Lead Connection Status: 753985
Implantable Lead Implant Date: 20151103
Implantable Lead Implant Date: 20151103
Implantable Lead Location: 753859
Implantable Lead Location: 753860
Implantable Lead Model: 5076
Implantable Lead Model: 5076
Implantable Pulse Generator Implant Date: 20151103
Lead Channel Impedance Value: 418 Ohm
Lead Channel Impedance Value: 475 Ohm
Lead Channel Impedance Value: 513 Ohm
Lead Channel Impedance Value: 551 Ohm
Lead Channel Pacing Threshold Amplitude: 0.5 V
Lead Channel Pacing Threshold Amplitude: 0.625 V
Lead Channel Pacing Threshold Pulse Width: 0.4 ms
Lead Channel Pacing Threshold Pulse Width: 0.4 ms
Lead Channel Sensing Intrinsic Amplitude: 4 mV
Lead Channel Sensing Intrinsic Amplitude: 4 mV
Lead Channel Sensing Intrinsic Amplitude: 6.375 mV
Lead Channel Sensing Intrinsic Amplitude: 6.375 mV
Lead Channel Setting Pacing Amplitude: 1.5 V
Lead Channel Setting Pacing Amplitude: 2 V
Lead Channel Setting Pacing Pulse Width: 0.4 ms
Lead Channel Setting Sensing Sensitivity: 2.8 mV
Zone Setting Status: 755011
Zone Setting Status: 755011

## 2023-10-10 ENCOUNTER — Ambulatory Visit (HOSPITAL_BASED_OUTPATIENT_CLINIC_OR_DEPARTMENT_OTHER): Payer: Medicare Other

## 2023-10-13 ENCOUNTER — Telehealth: Payer: Self-pay | Admitting: Internal Medicine

## 2023-10-13 DIAGNOSIS — R2681 Unsteadiness on feet: Secondary | ICD-10-CM | POA: Diagnosis not present

## 2023-10-13 DIAGNOSIS — M6281 Muscle weakness (generalized): Secondary | ICD-10-CM | POA: Diagnosis not present

## 2023-10-13 DIAGNOSIS — R55 Syncope and collapse: Secondary | ICD-10-CM | POA: Diagnosis not present

## 2023-10-13 DIAGNOSIS — M545 Low back pain, unspecified: Secondary | ICD-10-CM | POA: Diagnosis not present

## 2023-10-13 DIAGNOSIS — R278 Other lack of coordination: Secondary | ICD-10-CM | POA: Diagnosis not present

## 2023-10-13 NOTE — Telephone Encounter (Signed)
Patient states is very weak. Also states throat feels like it is closing up. Pharmacy is [facility]. EMS is at patient's home. Patient phone number is [phone].

## 2023-10-13 NOTE — Telephone Encounter (Signed)
Spoke with patient regarding prior message .  Patient stated she is still coughing and very little green mucus today . 0 fever  and nasal drainage. Patient stated she doesn't feel like she does have any energy.  Dr.Ramaswamy can you please advise  Thank you

## 2023-10-14 ENCOUNTER — Ambulatory Visit: Payer: Medicare Other | Admitting: Neurology

## 2023-10-14 DIAGNOSIS — M6281 Muscle weakness (generalized): Secondary | ICD-10-CM | POA: Diagnosis not present

## 2023-10-14 DIAGNOSIS — R278 Other lack of coordination: Secondary | ICD-10-CM | POA: Diagnosis not present

## 2023-10-14 DIAGNOSIS — M545 Low back pain, unspecified: Secondary | ICD-10-CM | POA: Diagnosis not present

## 2023-10-14 DIAGNOSIS — R2681 Unsteadiness on feet: Secondary | ICD-10-CM | POA: Diagnosis not present

## 2023-10-14 MED ORDER — PREDNISONE 10 MG PO TABS
ORAL_TABLET | ORAL | 0 refills | Status: AC
Start: 2023-10-14 — End: 2023-10-19

## 2023-10-14 MED ORDER — DOXYCYCLINE HYCLATE 100 MG PO TABS: 100.0000 mg | ORAL_TABLET | Freq: Two times a day (BID) | ORAL | 0 refills | Status: DC

## 2023-10-14 NOTE — Telephone Encounter (Signed)
Sounds like acut bronchitis. If get s worse got to ER  Take doxycycline 100mg  po twice daily x 5 days; take after meals and avoid sunlight   Please take prednisone 40 mg x1 day, then 30 mg x1 day, then 20 mg x1 day, then 10 mg x1 day, and then 5 mg x1 day and stop    Allergies  Allergen Reactions   Cefazolin Hives and Itching    Other reaction(s): severe hives   Pseudoephedrine Hcl     Other reaction(s): palpitations (moderate)   Ergotamine     unknown   Other Other (See Comments)   Pentobarbital     unknown   Pirfenidone     unknown   Caffeine Palpitations

## 2023-10-14 NOTE — Telephone Encounter (Signed)
Called and spoke with patient, advised of recommendations per Dr. Marchelle Gearing.  She verbalized understanding.  Verified she wanted the scripts sent to [facility]  Nothing further needed.

## 2023-10-15 ENCOUNTER — Ambulatory Visit (INDEPENDENT_AMBULATORY_CARE_PROVIDER_SITE_OTHER): Payer: Medicare Other | Admitting: Family Medicine

## 2023-10-15 ENCOUNTER — Encounter: Payer: Self-pay | Admitting: Family Medicine

## 2023-10-15 VITALS — BP 118/60 | HR 66 | Temp 97.4°F | Ht 67.0 in | Wt 256.0 lb

## 2023-10-15 DIAGNOSIS — F329 Major depressive disorder, single episode, unspecified: Secondary | ICD-10-CM

## 2023-10-15 DIAGNOSIS — R1310 Dysphagia, unspecified: Secondary | ICD-10-CM

## 2023-10-15 DIAGNOSIS — Z23 Encounter for immunization: Secondary | ICD-10-CM

## 2023-10-15 DIAGNOSIS — I480 Paroxysmal atrial fibrillation: Secondary | ICD-10-CM

## 2023-10-15 DIAGNOSIS — J8410 Pulmonary fibrosis, unspecified: Secondary | ICD-10-CM | POA: Diagnosis not present

## 2023-10-15 DIAGNOSIS — R921 Mammographic calcification found on diagnostic imaging of breast: Secondary | ICD-10-CM | POA: Diagnosis not present

## 2023-10-15 NOTE — Patient Instructions (Signed)
Keep GI appointment for December 19th.

## 2023-10-15 NOTE — Progress Notes (Signed)
Established Patient Office Visit  Subjective   [name], female    DOB: [birth year]  Age: 82 y.o. MRN: [id number]  Chief Complaint  Patient presents with   Gastroesophageal Reflux    HPI   [name] is seen today with some ongoing dysphagia concerns.  Refer to prior note for detail.  She has longstanding history of GERD and takes omeprazole currently twice daily.  Her dysphagia is predominantly solid foods.  She has lost little weight since she was here for the same complaint back in June.  We referred her at that time to GI and she has pending appointment December 19.  Previously saw GI provider with [facility].  She had study back in 2023 with barium swallow and she has some dilation of the esophagus with narrowing at the gastroesophageal junction which prohibited passing of 13 mm tablet..  She especially has difficulty with meat such as chicken.  She had hoped to get into see GI sooner.    Her chronic problems include atrial fibrillation, pulmonary fibrosis, obstructive sleep apnea, GERD.  Followed closely by pulmonary.  She has had high-dose flu vaccine.  Was instructed to get Prevnar 20.  She is equivocal regarding whether she will get COVID booster.  Had palpitations with previous vaccine.    She has history of recurrent depression currently stable on sertraline.  Her daughter passed away a few years ago and this is still difficult for her.  CLINICAL DATA:  History of dysmotility, experiences intermittent lump in throat   EXAM: ESOPHAGUS/BARIUM SWALLOW/TABLET STUDY   TECHNIQUE: Limited single contrast examination was performed using thin liquid barium and 13 mm barium tablet. Limited study secondary to patient stamina and mobility. This exam was performed by Sheliah Plane, PA-C, and was supervised and interpreted by Leanna Battles, MD.   FLUOROSCOPY: Radiation Exposure Index (as provided by the fluoroscopic device): 36.447 mGy   COMPARISON:  Upper GI  01/11/2019.   FINDINGS: Swallowing: Appears normal. No vestibular penetration or aspiration seen.   Pharynx: Unremarkable.   Esophagus: Small posterior esophageal web at the level of the upper esophageal sphincter. Esophagus is moderately dilated with distal tapering hindering passage of barium tablet. There is delayed esophageal emptying of liquid contrast. Multiple tertiary contractions are ineffective.   Hiatal Hernia: Hiatal hernia not appreciated secondary to esophageal functioning.   Gastroesophageal reflux: Not evaluated.   Other: None.   IMPRESSION: 1. Limited study reveals dilated esophagus with slight narrowing at the gastroesophageal junction, through which a 13 mm barium tablet would not pass.   2. 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 to thin barium or tab  Past Medical History:  Diagnosis Date   Alpha-1-antitrypsin deficiency carrier    Arthritis    "all over"   Atherosclerosis of aorta (HCC)    Atrial fibrillation (HCC)    Back pain    Complication of anesthesia    "I woke up during 2 different procedures" (10/04/2014)   Depression    Diastolic dysfunction    Eating disorder    Frequent UTI    GERD (gastroesophageal reflux disease)    Heart disease    Hypertension    Hypertriglyceridemia    Insulin resistance    Long term current use of anticoagulant    Multiple lung nodules    Muscular deconditioning    MVP (mitral valve prolapse)    Obesity    OSA on CPAP    Osteoarthritis    Osteopenia    unspecified location  Pacemaker    PAF (paroxysmal atrial fibrillation) (HCC)    Positive ANA (antinuclear antibody)    Presbyesophagus    Presence of permanent cardiac pacemaker    Primary osteoarthritis of both hands    neck and spine and hips   Pulmonary fibrosis (HCC)    followed by pulmonolgy   PVC's (premature ventricular contractions)    Shortness of breath    Sinus arrest 10/2014   s/p Medtronic Advisa model A2DR01 serial  number [id number] H   Sleep apnea    Stroke (HCC) 01/2014   denies deficits on 10/04/2014   Tachy-brady syndrome (HCC) 10/2014   TIA (transient ischemic attack)    Vitamin D deficiency    Past Surgical History:  Procedure Laterality Date   CARDIOVERSION N/A 09/02/2017   Procedure: CARDIOVERSION;  Surgeon: Thurmon Fair, MD;  Location: [facility];  Service: Cardiovascular;  Laterality: N/A;   COLONOSCOPY  2019   ESOPHAGEAL DILATION     EXCISION VAGINAL CYST     benign nodules   INSERT / REPLACE / REMOVE PACEMAKER  10/04/2014   Medtronic Advisa model A2DR01 serial number [id number] H   LOOP RECORDER EXPLANT N/A 10/04/2014   Procedure: LOOP RECORDER EXPLANT;  Surgeon: Thurmon Fair, MD;  Location: [facility];  Service: Cardiovascular;  Laterality: N/A;   LOOP RECORDER IMPLANT N/A 04/19/2014   Procedure: LOOP RECORDER IMPLANT;  Surgeon: Thurmon Fair, MD;  Location: [facility];  Service: Cardiovascular;  Laterality: N/A;   NM MYOCAR PERF WALL MOTION  12/18/2007   normal   PERMANENT PACEMAKER INSERTION N/A 10/04/2014   Procedure: PERMANENT PACEMAKER INSERTION;  Surgeon: Thurmon Fair, MD;  Location: [facility];  Service: Cardiovascular;  Laterality: N/A;   TUBAL LIGATION     US ECHOCARDIOGRAPHY  05/15/2010   LA mildly dilated,mild mitral annular ca+, AOV mildly sclerotic, mild asymmetric LVH    reports that she quit smoking about 53 years ago. Her smoking use included cigarettes. She started smoking about 71 years ago. She has a 36 pack-year smoking history. She has never used smokeless tobacco. She reports current alcohol use. She reports that she does not use drugs. family history includes Alcoholism in her brother; Alpha-1 antitrypsin deficiency in her daughter; Arthritis in her son; Breast cancer in her daughter; Diabetes in her son; Healthy in her brother, daughter, and sister; Heart attack in her mother; Heart failure in her father; Prostate cancer in her brother, brother, and brother;  Stroke in her brother, brother, and father; Sudden death in her mother. Allergies  Allergen Reactions   Cefazolin Hives and Itching    Other reaction(s): severe hives   Pseudoephedrine Hcl     Other reaction(s): palpitations (moderate)   Ergotamine     unknown   Other Other (See Comments)   Pentobarbital     unknown   Pirfenidone     unknown   Caffeine Palpitations     Review of Systems  Constitutional:  Positive for weight loss.  Eyes:  Negative for blurred vision.  Respiratory:  Negative for shortness of breath.   Cardiovascular:  Negative for chest pain.  Gastrointestinal:  Negative for abdominal pain, nausea and vomiting.  Neurological:  Negative for dizziness, weakness and headaches.      Objective:     BP 118/60 (BP Location: Left Arm, Patient Position: Sitting, Cuff Size: Large)   Pulse 66   Temp (!) 97.4 F (36.3 C) (Oral)   Ht 5\' 7"  (1.702 m)   Wt 256 lb (116.1 kg)  LMP  (LMP Unknown)   SpO2 96%   BMI 40.10 kg/m  BP Readings from Last 3 Encounters:  10/15/23 118/60  10/01/23 108/66  07/22/23 110/70   Wt Readings from Last 3 Encounters:  10/15/23 256 lb (116.1 kg)  10/01/23 258 lb (117 kg)  07/22/23 262 lb 3.2 oz (118.9 kg)      Physical Exam Vitals reviewed.  Constitutional:      General: She is not in acute distress. Cardiovascular:     Rate and Rhythm: Normal rate and regular rhythm.  Pulmonary:     Effort: Pulmonary effort is normal.  Musculoskeletal:     Cervical back: Neck supple.  Lymphadenopathy:     Cervical: No cervical adenopathy.  Neurological:     Mental Status: She is alert.      No results found for any visits on 10/15/23.  Last CBC Lab Results  Component Value Date   WBC 8.8 06/10/2023   HGB 15.6 (H) 06/10/2023   HCT 48.1 (H) 06/10/2023   MCV 86.2 06/10/2023   MCH 28.0 06/10/2023   RDW 13.4 06/10/2023   PLT 238 06/10/2023   Last metabolic panel Lab Results  Component Value Date   GLUCOSE 121 (H) 06/10/2023    NA 137 06/10/2023   K 4.1 06/10/2023   CL 102 06/10/2023   CO2 27 06/10/2023   BUN 20 06/10/2023   CREATININE 1.18 (H) 06/10/2023   GFRNONAA 46 (L) 06/10/2023   CALCIUM 8.8 (L) 06/10/2023   PROT 7.0 06/10/2023   ALBUMIN 3.2 (L) 06/10/2023   LABGLOB 2.4 07/21/2019   AGRATIO 1.7 07/21/2019   BILITOT 0.6 06/10/2023   ALKPHOS 76 06/10/2023   AST 25 06/10/2023   ALT 18 06/10/2023   ANIONGAP 8 06/10/2023   Last lipids Lab Results  Component Value Date   CHOL 195 07/28/2020   HDL 55 07/28/2020   LDLCALC 115 (H) 07/28/2020   LDLDIRECT 81.0 07/15/2016   TRIG 133 07/28/2020   CHOLHDL 3.5 07/28/2020   Last hemoglobin A1c Lab Results  Component Value Date   HGBA1C 5.9 (H) 07/28/2020   Last thyroid functions Lab Results  Component Value Date   TSH 3.320 01/04/2021   T3TOTAL 133 09/22/2018      The ASCVD Risk score (Arnett DK, et al., 2019) failed to calculate for the following reasons:   The 2019 ASCVD risk score is only valid for ages 47 to 63   The patient has a prior MI or stroke diagnosis    Assessment & Plan:   #1 progressive solid food dysphagia.  She has had similar problems in the past.  She reports she has had esophagus dilated once previously but cannot provide details.  Has had some recent modest weight loss of uncertain significance.  Pending GI follow-up December 19.  Continue omeprazole twice daily.  #2 longstanding history of GERD.  Discussed GERD management.  Continue PPI.  Avoid eating within 3 hours of bedtime if possible.  We did discuss other potential triggering foods such as mint which she was not aware of  #3 pulmonary fibrosis followed by pulmonary.  Patient has had high-dose flu vaccine.  Requesting Prevnar 20.  This was given.  She is considering possible COVID booster at local pharmacy  #4 history of atrial fibrillation.  Sinus rhythm at this time.  Continue close follow-up with cardiology.  She remains on Tikosyn and Eliquis along with low-dose  metoprolol.  #5 history of recurrent depression.  Patient on sertraline 100 mg daily  and stable.  No follow-ups on file.    Evelena Peat, MD

## 2023-10-20 DIAGNOSIS — M6281 Muscle weakness (generalized): Secondary | ICD-10-CM | POA: Diagnosis not present

## 2023-10-20 DIAGNOSIS — R2681 Unsteadiness on feet: Secondary | ICD-10-CM | POA: Diagnosis not present

## 2023-10-20 DIAGNOSIS — M545 Low back pain, unspecified: Secondary | ICD-10-CM | POA: Diagnosis not present

## 2023-10-20 DIAGNOSIS — R278 Other lack of coordination: Secondary | ICD-10-CM | POA: Diagnosis not present

## 2023-10-22 DIAGNOSIS — R278 Other lack of coordination: Secondary | ICD-10-CM | POA: Diagnosis not present

## 2023-10-22 DIAGNOSIS — M545 Low back pain, unspecified: Secondary | ICD-10-CM | POA: Diagnosis not present

## 2023-10-22 DIAGNOSIS — R2681 Unsteadiness on feet: Secondary | ICD-10-CM | POA: Diagnosis not present

## 2023-10-22 DIAGNOSIS — M6281 Muscle weakness (generalized): Secondary | ICD-10-CM | POA: Diagnosis not present

## 2023-10-24 DIAGNOSIS — G4733 Obstructive sleep apnea (adult) (pediatric): Secondary | ICD-10-CM | POA: Diagnosis not present

## 2023-10-26 DIAGNOSIS — M545 Low back pain, unspecified: Secondary | ICD-10-CM | POA: Diagnosis not present

## 2023-10-26 DIAGNOSIS — R278 Other lack of coordination: Secondary | ICD-10-CM | POA: Diagnosis not present

## 2023-10-26 DIAGNOSIS — M6281 Muscle weakness (generalized): Secondary | ICD-10-CM | POA: Diagnosis not present

## 2023-10-26 DIAGNOSIS — R2681 Unsteadiness on feet: Secondary | ICD-10-CM | POA: Diagnosis not present

## 2023-10-27 ENCOUNTER — Telehealth (HOSPITAL_BASED_OUTPATIENT_CLINIC_OR_DEPARTMENT_OTHER): Payer: Self-pay | Admitting: Internal Medicine

## 2023-10-28 DIAGNOSIS — R2681 Unsteadiness on feet: Secondary | ICD-10-CM | POA: Diagnosis not present

## 2023-10-28 DIAGNOSIS — R278 Other lack of coordination: Secondary | ICD-10-CM | POA: Diagnosis not present

## 2023-10-28 DIAGNOSIS — M6281 Muscle weakness (generalized): Secondary | ICD-10-CM | POA: Diagnosis not present

## 2023-10-28 DIAGNOSIS — M545 Low back pain, unspecified: Secondary | ICD-10-CM | POA: Diagnosis not present

## 2023-10-28 NOTE — Progress Notes (Signed)
Remote pacemaker transmission.   

## 2023-10-29 ENCOUNTER — Other Ambulatory Visit (HOSPITAL_COMMUNITY): Payer: Self-pay | Admitting: *Deleted

## 2023-10-29 MED ORDER — DOFETILIDE 500 MCG PO CAPS: 500.0000 ug | ORAL_CAPSULE | Freq: Two times a day (BID) | ORAL | 1 refills | Status: DC

## 2023-10-31 DIAGNOSIS — J8410 Pulmonary fibrosis, unspecified: Secondary | ICD-10-CM | POA: Diagnosis not present

## 2023-10-31 DIAGNOSIS — I2510 Atherosclerotic heart disease of native coronary artery without angina pectoris: Secondary | ICD-10-CM | POA: Diagnosis not present

## 2023-11-03 DIAGNOSIS — M6281 Muscle weakness (generalized): Secondary | ICD-10-CM | POA: Diagnosis not present

## 2023-11-03 DIAGNOSIS — M545 Low back pain, unspecified: Secondary | ICD-10-CM | POA: Diagnosis not present

## 2023-11-03 DIAGNOSIS — R278 Other lack of coordination: Secondary | ICD-10-CM | POA: Diagnosis not present

## 2023-11-03 DIAGNOSIS — R2681 Unsteadiness on feet: Secondary | ICD-10-CM | POA: Diagnosis not present

## 2023-11-04 DIAGNOSIS — R2681 Unsteadiness on feet: Secondary | ICD-10-CM | POA: Diagnosis not present

## 2023-11-04 DIAGNOSIS — M545 Low back pain, unspecified: Secondary | ICD-10-CM | POA: Diagnosis not present

## 2023-11-04 DIAGNOSIS — R278 Other lack of coordination: Secondary | ICD-10-CM | POA: Diagnosis not present

## 2023-11-04 DIAGNOSIS — M6281 Muscle weakness (generalized): Secondary | ICD-10-CM | POA: Diagnosis not present

## 2023-11-05 DIAGNOSIS — R2681 Unsteadiness on feet: Secondary | ICD-10-CM | POA: Diagnosis not present

## 2023-11-05 DIAGNOSIS — M6281 Muscle weakness (generalized): Secondary | ICD-10-CM | POA: Diagnosis not present

## 2023-11-05 DIAGNOSIS — R278 Other lack of coordination: Secondary | ICD-10-CM | POA: Diagnosis not present

## 2023-11-05 DIAGNOSIS — M545 Low back pain, unspecified: Secondary | ICD-10-CM | POA: Diagnosis not present

## 2023-11-07 ENCOUNTER — Other Ambulatory Visit: Payer: Medicare Other

## 2023-11-10 DIAGNOSIS — M545 Low back pain, unspecified: Secondary | ICD-10-CM | POA: Diagnosis not present

## 2023-11-10 DIAGNOSIS — R278 Other lack of coordination: Secondary | ICD-10-CM | POA: Diagnosis not present

## 2023-11-10 DIAGNOSIS — M6281 Muscle weakness (generalized): Secondary | ICD-10-CM | POA: Diagnosis not present

## 2023-11-10 DIAGNOSIS — R2681 Unsteadiness on feet: Secondary | ICD-10-CM | POA: Diagnosis not present

## 2023-11-12 ENCOUNTER — Ambulatory Visit (HOSPITAL_BASED_OUTPATIENT_CLINIC_OR_DEPARTMENT_OTHER)
Admission: RE | Admit: 2023-11-12 | Discharge: 2023-11-12 | Disposition: A | Discharge status: Home / Self Care | Payer: Medicare Other | Source: Ambulatory Visit | Attending: Internal Medicine | Admitting: Internal Medicine

## 2023-11-12 DIAGNOSIS — M545 Low back pain, unspecified: Secondary | ICD-10-CM | POA: Diagnosis not present

## 2023-11-12 DIAGNOSIS — M6281 Muscle weakness (generalized): Secondary | ICD-10-CM | POA: Diagnosis not present

## 2023-11-12 DIAGNOSIS — I700 Atherosclerosis of aorta: Secondary | ICD-10-CM | POA: Diagnosis not present

## 2023-11-12 DIAGNOSIS — R918 Other nonspecific abnormal finding of lung field: Secondary | ICD-10-CM | POA: Diagnosis not present

## 2023-11-12 DIAGNOSIS — R278 Other lack of coordination: Secondary | ICD-10-CM | POA: Diagnosis not present

## 2023-11-12 DIAGNOSIS — J84112 Idiopathic pulmonary fibrosis: Secondary | ICD-10-CM | POA: Diagnosis not present

## 2023-11-12 DIAGNOSIS — R2681 Unsteadiness on feet: Secondary | ICD-10-CM | POA: Diagnosis not present

## 2023-11-12 DIAGNOSIS — K2289 Other specified disease of esophagus: Secondary | ICD-10-CM | POA: Diagnosis not present

## 2023-11-14 ENCOUNTER — Other Ambulatory Visit: Payer: Medicare Other

## 2023-11-17 DIAGNOSIS — R278 Other lack of coordination: Secondary | ICD-10-CM | POA: Diagnosis not present

## 2023-11-17 DIAGNOSIS — M6281 Muscle weakness (generalized): Secondary | ICD-10-CM | POA: Diagnosis not present

## 2023-11-17 DIAGNOSIS — M545 Low back pain, unspecified: Secondary | ICD-10-CM | POA: Diagnosis not present

## 2023-11-17 DIAGNOSIS — R2681 Unsteadiness on feet: Secondary | ICD-10-CM | POA: Diagnosis not present

## 2023-11-19 DIAGNOSIS — M6281 Muscle weakness (generalized): Secondary | ICD-10-CM | POA: Diagnosis not present

## 2023-11-19 DIAGNOSIS — R2681 Unsteadiness on feet: Secondary | ICD-10-CM | POA: Diagnosis not present

## 2023-11-19 DIAGNOSIS — M545 Low back pain, unspecified: Secondary | ICD-10-CM | POA: Diagnosis not present

## 2023-11-19 DIAGNOSIS — R278 Other lack of coordination: Secondary | ICD-10-CM | POA: Diagnosis not present

## 2023-11-20 ENCOUNTER — Other Ambulatory Visit: Payer: Self-pay | Admitting: Family Medicine

## 2023-11-20 ENCOUNTER — Encounter: Payer: Self-pay | Admitting: Gastroenterology

## 2023-11-20 ENCOUNTER — Ambulatory Visit: Payer: Medicare Other | Admitting: Gastroenterology

## 2023-11-20 VITALS — BP 104/70 | HR 81 | Ht 67.0 in | Wt 259.0 lb

## 2023-11-20 DIAGNOSIS — I509 Heart failure, unspecified: Secondary | ICD-10-CM

## 2023-11-20 DIAGNOSIS — R1110 Vomiting, unspecified: Secondary | ICD-10-CM

## 2023-11-20 DIAGNOSIS — I4891 Unspecified atrial fibrillation: Secondary | ICD-10-CM

## 2023-11-20 DIAGNOSIS — I480 Paroxysmal atrial fibrillation: Secondary | ICD-10-CM

## 2023-11-20 DIAGNOSIS — J8410 Pulmonary fibrosis, unspecified: Secondary | ICD-10-CM

## 2023-11-20 DIAGNOSIS — R1310 Dysphagia, unspecified: Secondary | ICD-10-CM

## 2023-11-20 DIAGNOSIS — K219 Gastro-esophageal reflux disease without esophagitis: Secondary | ICD-10-CM

## 2023-11-20 DIAGNOSIS — Z6841 Body Mass Index (BMI) 40.0 and over, adult: Secondary | ICD-10-CM

## 2023-11-20 DIAGNOSIS — I495 Sick sinus syndrome: Secondary | ICD-10-CM

## 2023-11-20 DIAGNOSIS — I272 Pulmonary hypertension, unspecified: Secondary | ICD-10-CM

## 2023-11-20 DIAGNOSIS — Z950 Presence of cardiac pacemaker: Secondary | ICD-10-CM

## 2023-11-20 DIAGNOSIS — G4733 Obstructive sleep apnea (adult) (pediatric): Secondary | ICD-10-CM

## 2023-11-20 MED ORDER — PANTOPRAZOLE SODIUM 40 MG PO TBEC: 40.0000 mg | DELAYED_RELEASE_TABLET | Freq: Two times a day (BID) | ORAL | 3 refills | Status: AC

## 2023-11-20 NOTE — Patient Instructions (Signed)
_______________________________________________________  If your blood pressure at your visit was 140/90 or greater, please contact your primary care physician to follow up on this. _______________________________________________________  If you are age 83 or older, your body mass index should be between 23-30. Your Body mass index is 40.57 kg/m. If this is out of the aforementioned range listed, please consider follow up with your Primary Care Provider. ________________________________________________________  The [facility] providers would like to encourage you to use [facility] to communicate with providers for non-urgent requests or questions.  Due to long hold times on the telephone, sending your provider a message by [facility] may be a faster and more efficient way to get a response.  Please allow 48 business hours for a response.  Please remember that this is for non-urgent requests.  _______________________________________________________  We have sent the following medications to your pharmacy for you to pick up at your convenience:  DISCONTINUE: Prilosec  START: Protonix 40mg  one tablet twice daily  You will follow up in our office on an as needed basis.  It was a pleasure to see you today!  Vito Cirigliano, D.O.

## 2023-11-20 NOTE — Progress Notes (Signed)
Chief Complaint: Dysphagia   Referring Provider:     Kristian Covey, MD    HPI:     [name] [name] is a 82 y.o. female with a history of achycardia-bradycardia syndrome (sinus node dysfunction, dual-chamber Medtronic pacemaker implanted 2015), chronic diastolic heart failure, mitral valve prolapse, atrial fibrillation (on Eliquis), pulmonary fibrosis prescribed home O2, OSA on CPAP, CHF w/ pEF, pulmonary HTN, GERD, HTN, TIA, obesity (BMI 40), former smoker, referred to the Gastroenterology Clinic for evaluation of dysphagia.  Previously followed with Dr. Kinnie Scales for GERD and recurrent esophageal stricture, last seen 08/14/2022.   Has also been evaluated by Dr. Marca Ancona in [facility].  Limited previous records available for review. She did have barium swallow study done on 02/27/2022, notable for slight narrowing at the GE junction through which a 13 mm barium tablet would not pass along with posterior cervical esophageal web which is nonobstructive to thin barium or tablet.  Esophagus moderately dilated, but no hiatal hernia.    At the time of her last appoint with Dr. Jennye Boroughs office, had tentative plan for EGD with dilation in the hospital setting pending clearance by cardiology and pulmonology due to significant cardiopulmonary disease.  Was otherwise treated with omeprazole 40 mg twice daily.  Was seen by her PCM on 10/15/2023 with ongoing dysphagia to solids.  Today, she reports she continues to have intermittent solid food dysphagia.  Will occasionally regurgitate food back out again. Reports having improvement with previous EGD with dilation, but sxs have slowly returned. Has also had intermittent hoarseness.   Has been having nocturnal reflux sxs. Worse when overeating.    Underwent virtual colonoscopy in 05/2022 without polypoid filling defect or annular constricting lesions, left colonic diverticulosis.  Otherwise last colonoscopy appears to have been in 2019, but that  report not available for review.  - 01/2019: EGD: Presbyesophagus without esophageal stricture, normal stomach, duodenum.  Empiric esophageal dilation with 17, 18, 19, 20 mm Savary dilator with superficial mucosal disruption and distal esophagus   Past Medical History:  Diagnosis Date   Alpha-1-antitrypsin deficiency carrier    Arthritis    "all over"   Atherosclerosis of aorta (HCC)    Atrial fibrillation (HCC)    Back pain    Complication of anesthesia    "I woke up during 2 different procedures" (10/04/2014)   Depression    Diastolic dysfunction    Eating disorder    Frequent UTI    GERD (gastroesophageal reflux disease)    Heart disease    Hypertension    Hypertriglyceridemia    Insulin resistance    Long term current use of anticoagulant    Multiple lung nodules    Muscular deconditioning    MVP (mitral valve prolapse)    Obesity    OSA on CPAP    Osteoarthritis    Osteopenia    unspecified location   Pacemaker    PAF (paroxysmal atrial fibrillation) (HCC)    Positive ANA (antinuclear antibody)    Presbyesophagus    Presence of permanent cardiac pacemaker    Primary osteoarthritis of both hands    neck and spine and hips   Pulmonary fibrosis (HCC)    followed by pulmonolgy   PVC's (premature ventricular contractions)    Shortness of breath    Sinus arrest 10/2014   s/p Medtronic Advisa model A2DR01 serial number [id number] H   Sleep apnea    Stroke (HCC) 01/2014  denies deficits on 10/04/2014   Tachy-brady syndrome (HCC) 10/2014   TIA (transient ischemic attack)    Vitamin D deficiency      Past Surgical History:  Procedure Laterality Date   CARDIOVERSION N/A 09/02/2017   Procedure: CARDIOVERSION;  Surgeon: Thurmon Fair, MD;  Location: [facility];  Service: Cardiovascular;  Laterality: N/A;   COLONOSCOPY  2019   ESOPHAGEAL DILATION     EXCISION VAGINAL CYST     benign nodules   INSERT / REPLACE / REMOVE PACEMAKER  10/04/2014   Medtronic Advisa model  A2DR01 serial number [id number] H   LOOP RECORDER EXPLANT N/A 10/04/2014   Procedure: LOOP RECORDER EXPLANT;  Surgeon: Thurmon Fair, MD;  Location: [facility];  Service: Cardiovascular;  Laterality: N/A;   LOOP RECORDER IMPLANT N/A 04/19/2014   Procedure: LOOP RECORDER IMPLANT;  Surgeon: Thurmon Fair, MD;  Location: [facility];  Service: Cardiovascular;  Laterality: N/A;   NM MYOCAR PERF WALL MOTION  12/18/2007   normal   PERMANENT PACEMAKER INSERTION N/A 10/04/2014   Procedure: PERMANENT PACEMAKER INSERTION;  Surgeon: Thurmon Fair, MD;  Location: [facility];  Service: Cardiovascular;  Laterality: N/A;   TUBAL LIGATION     US ECHOCARDIOGRAPHY  05/15/2010   LA mildly dilated,mild mitral annular ca+, AOV mildly sclerotic, mild asymmetric LVH   Family History  Problem Relation Age of Onset   Heart attack Mother    Sudden death Mother    Stroke Father    Heart failure Father    Alcoholism Brother    Healthy Sister    Stroke Brother    Alpha-1 antitrypsin deficiency Daughter    Breast cancer Daughter    Prostate cancer Brother    Stroke Brother    Prostate cancer Brother    Healthy Brother    Prostate cancer Brother    Healthy Daughter    Diabetes Son    Arthritis Son    Social History   Tobacco Use   Smoking status: Former    Current packs/day: 0.00    Average packs/day: 2.0 packs/day for 18.0 years (36.0 ttl pk-yrs)    Types: Cigarettes    Start date: 07/18/1952    Quit date: 07/18/1970    Years since quitting: 53.3   Smokeless tobacco: Never   Tobacco comments:    Former smoker 07/18/22 quit smoking in 1971  Vaping Use   Vaping status: Never Used  Substance Use Topics   Alcohol use: Yes    Alcohol/week: 0.0 standard drinks of alcohol    Comment: 10/04/2014 "might have a drink a couple times/yr"   Drug use: No   Current Outpatient Medications  Medication Sig Dispense Refill   Cholecalciferol (VITAMIN D3 PO) Take 1 tablet by mouth daily.     diazepam (VALIUM) 5 MG  tablet TAKE 1 TABLET ONCE A DAY AS NEEDED. 30 tablet 0   dofetilide (TIKOSYN) 500 MCG capsule Take 1 capsule (500 mcg total) by mouth 2 (two) times daily. 180 capsule 1   ELIQUIS 5 MG TABS tablet TAKE ONE TABLET BY MOUTH TWICE DAILY 180 tablet 1   ipratropium (ATROVENT) 0.06 % nasal spray Place 2 sprays into both nostrils 4 (four) times daily as needed for rhinitis. 15 mL 12   Magnesium 400 MG CAPS Take 400 mg by mouth daily. 30 capsule 0   metoprolol tartrate (LOPRESSOR) 25 MG tablet Take 0.5 tablets (12.5 mg total) by mouth 2 (two) times daily. 90 tablet 3   omeprazole (PRILOSEC) 40 MG capsule  Take 1 capsule by mouth daily.     sertraline (ZOLOFT) 100 MG tablet TAKE ONE TABLET BY MOUTH ONCE DAILY. 90 tablet 3   doxycycline (VIBRA-TABS) 100 MG tablet Take 1 tablet (100 mg total) by mouth 2 (two) times daily. (Patient not taking: Reported on 11/20/2023) 10 tablet 0   fluticasone (FLONASE) 50 MCG/ACT nasal spray Place 1 spray into both nostrils daily. (Patient not taking: Reported on 11/20/2023) 16 g 2   nitroGLYCERIN (NITROSTAT) 0.4 MG SL tablet Place 1 tablet (0.4 mg total) under the tongue every 5 (five) minutes as needed for chest pain. (Patient not taking: Reported on 11/20/2023) 25 tablet 1   nystatin cream (MYCOSTATIN) Apply to affected rash twice a day as needed (Patient not taking: Reported on 11/20/2023) 30 g 0   No current facility-administered medications for this visit.   Allergies  Allergen Reactions   Cefazolin Hives and Itching    Other reaction(s): severe hives   Pseudoephedrine Hcl     Other reaction(s): palpitations (moderate)   Ergotamine     unknown   Other Other (See Comments)   Pentobarbital     unknown   Pirfenidone     unknown   Caffeine Palpitations     Review of Systems: All systems reviewed and negative except where noted in HPI.     Physical Exam:    Wt Readings from Last 3 Encounters:  11/20/23 259 lb (117.5 kg)  10/15/23 256 lb (116.1 kg)   10/01/23 258 lb (117 kg)    BP 104/70   Pulse 81   Ht 5\' 7"  (1.702 m)   Wt 259 lb (117.5 kg)   LMP  (LMP Unknown)   BMI 40.57 kg/m  Constitutional:  Pleasant, in no acute distress. Psychiatric: Normal mood and affect. Behavior is normal. Neurological: Alert and oriented to person place and time. Skin: Skin is warm and dry. No rashes noted.   ASSESSMENT AND PLAN;   1) Dysphagia 2) GERD 3) Regurgitation 82 year old female with longstanding history of solid food dysphagia.  Previous esophagram with delayed passage of barium tablet in the lower esophagus.  Did undergo EGD in 2020 which was without obvious stricture, but did have clinical improvement with empiric esophageal dilation.  We discussed the role/utility of repeat EGD with esophageal dilation at length today.  We discussed the risk, benefits, alternatives.  Given her significant cardiopulmonary comorbidities, she feels that the risks outweigh the benefits and does not want to proceed with any endoscopic procedures.  We additionally discussed the role/utility of esophageal manometry, and again does not want to proceed as this would be unlikely to change her therapeutic plan.  - Will empirically change Prilosec to Protonix 40 mg BID - As above, patient does not wish to proceed with EGD due to risk/benefit profile - Discussed antireflux lifestyle/dietary modifications at length today, including HOB elevation, avoid eating close to bedtime, avoid overeating, particularly with dinner -If ever planning on EGD, will need to be done in the hospital endoscopy setting due to elevated periprocedural risks from underlying comorbidities  4) Pulmonary fibrosis on home O2 5) OSA on CPAP 6) Pulmonary hypertension 7) CHF 8) Atrial fibrillation 9) SSS with pacemaker - Continue close follow-up in the Pulmonary and Cardiology clinics - As above, patient feels that risks outweigh benefits of endoscopic evaluation    Can follow-up with me in  the GI clinic as needed   Shellia Cleverly, DO, FACG  11/20/2023, 2:28 PM   Burchette, Elberta Fortis, MD

## 2023-11-29 ENCOUNTER — Telehealth: Payer: Self-pay | Admitting: Internal Medicine

## 2023-11-29 DIAGNOSIS — J84112 Idiopathic pulmonary fibrosis: Secondary | ICD-10-CM

## 2023-11-29 NOTE — Telephone Encounter (Signed)
Pulmonary function test showing decline in pulmonary fibrosis.  Therefore we got a high-resolution CT chest.  This also showing the same  Plan - Last seen end of October 2024.  Please give appointment for spirometry and DLCO and first available 15-minute slot.  Can be in February 2020 for March 2025.  Spirometry order placed

## 2023-11-30 DIAGNOSIS — J8410 Pulmonary fibrosis, unspecified: Secondary | ICD-10-CM | POA: Diagnosis not present

## 2023-11-30 DIAGNOSIS — I2510 Atherosclerotic heart disease of native coronary artery without angina pectoris: Secondary | ICD-10-CM | POA: Diagnosis not present

## 2023-12-09 ENCOUNTER — Other Ambulatory Visit: Payer: Self-pay | Admitting: Family Medicine

## 2023-12-09 ENCOUNTER — Other Ambulatory Visit: Payer: Self-pay | Admitting: Cardiovascular Disease

## 2023-12-09 DIAGNOSIS — F331 Major depressive disorder, recurrent, moderate: Secondary | ICD-10-CM

## 2023-12-31 DIAGNOSIS — I2510 Atherosclerotic heart disease of native coronary artery without angina pectoris: Secondary | ICD-10-CM | POA: Diagnosis not present

## 2023-12-31 DIAGNOSIS — J8410 Pulmonary fibrosis, unspecified: Secondary | ICD-10-CM | POA: Diagnosis not present

## 2024-01-05 ENCOUNTER — Other Ambulatory Visit: Payer: Self-pay | Admitting: Cardiovascular Disease

## 2024-01-05 ENCOUNTER — Other Ambulatory Visit: Payer: Self-pay | Admitting: Family Medicine

## 2024-01-05 DIAGNOSIS — I480 Paroxysmal atrial fibrillation: Secondary | ICD-10-CM

## 2024-01-05 DIAGNOSIS — Z6841 Body Mass Index (BMI) 40.0 and over, adult: Secondary | ICD-10-CM

## 2024-01-05 NOTE — Telephone Encounter (Signed)
Prescription refill request for Eliquis received. Indication:afib Last office visit:8/24 Scr:1.18  7/24 Age: 83 Weight:117.5  kg  Prescription refilled

## 2024-01-07 ENCOUNTER — Ambulatory Visit (INDEPENDENT_AMBULATORY_CARE_PROVIDER_SITE_OTHER): Payer: Medicare Other

## 2024-01-07 DIAGNOSIS — I495 Sick sinus syndrome: Secondary | ICD-10-CM | POA: Diagnosis not present

## 2024-01-10 ENCOUNTER — Encounter: Payer: Self-pay | Admitting: Cardiovascular Disease

## 2024-01-10 LAB — CUP PACEART REMOTE DEVICE CHECK
Battery Remaining Longevity: 20 mo
Battery Voltage: 2.91 V
Brady Statistic AP VP Percent: 0.36 %
Brady Statistic AP VS Percent: 94.52 %
Brady Statistic AS VP Percent: 0.01 %
Brady Statistic AS VS Percent: 5.12 %
Brady Statistic RA Percent Paced: 94.73 %
Brady Statistic RV Percent Paced: 0.51 %
Date Time Interrogation Session: 20250207102559
Implantable Lead Connection Status: 753985
Implantable Lead Connection Status: 753985
Implantable Lead Implant Date: 20151103
Implantable Lead Implant Date: 20151103
Implantable Lead Location: 753859
Implantable Lead Location: 753860
Implantable Lead Model: 5076
Implantable Lead Model: 5076
Implantable Pulse Generator Implant Date: 20151103
Lead Channel Impedance Value: 456 Ohm
Lead Channel Impedance Value: 494 Ohm
Lead Channel Impedance Value: 646 Ohm
Lead Channel Impedance Value: 646 Ohm
Lead Channel Pacing Threshold Amplitude: 0.5 V
Lead Channel Pacing Threshold Amplitude: 0.625 V
Lead Channel Pacing Threshold Pulse Width: 0.4 ms
Lead Channel Pacing Threshold Pulse Width: 0.4 ms
Lead Channel Sensing Intrinsic Amplitude: 4.625 mV
Lead Channel Sensing Intrinsic Amplitude: 4.625 mV
Lead Channel Sensing Intrinsic Amplitude: 8 mV
Lead Channel Sensing Intrinsic Amplitude: 8 mV
Lead Channel Setting Pacing Amplitude: 1.5 V
Lead Channel Setting Pacing Amplitude: 2 V
Lead Channel Setting Pacing Pulse Width: 0.4 ms
Lead Channel Setting Sensing Sensitivity: 2.8 mV
Zone Setting Status: 755011
Zone Setting Status: 755011

## 2024-01-12 DIAGNOSIS — G4733 Obstructive sleep apnea (adult) (pediatric): Secondary | ICD-10-CM | POA: Diagnosis not present

## 2024-01-14 ENCOUNTER — Ambulatory Visit (HOSPITAL_COMMUNITY)
Admission: RE | Admit: 2024-01-14 | Discharge: 2024-01-14 | Disposition: A | Discharge status: Home / Self Care | Payer: Medicare Other | Source: Ambulatory Visit | Attending: Physician Assistant | Admitting: Physician Assistant

## 2024-01-14 ENCOUNTER — Encounter (HOSPITAL_COMMUNITY): Payer: Self-pay | Admitting: Physician Assistant

## 2024-01-14 VITALS — BP 112/60 | HR 70 | Ht 67.0 in | Wt 253.0 lb

## 2024-01-14 DIAGNOSIS — Z950 Presence of cardiac pacemaker: Secondary | ICD-10-CM | POA: Diagnosis not present

## 2024-01-14 DIAGNOSIS — Z6839 Body mass index (BMI) 39.0-39.9, adult: Secondary | ICD-10-CM | POA: Diagnosis not present

## 2024-01-14 DIAGNOSIS — G4733 Obstructive sleep apnea (adult) (pediatric): Secondary | ICD-10-CM | POA: Diagnosis not present

## 2024-01-14 DIAGNOSIS — Z79899 Other long term (current) drug therapy: Secondary | ICD-10-CM | POA: Insufficient documentation

## 2024-01-14 DIAGNOSIS — Z5181 Encounter for therapeutic drug level monitoring: Secondary | ICD-10-CM | POA: Diagnosis not present

## 2024-01-14 DIAGNOSIS — Z8673 Personal history of transient ischemic attack (TIA), and cerebral infarction without residual deficits: Secondary | ICD-10-CM | POA: Insufficient documentation

## 2024-01-14 DIAGNOSIS — Z7901 Long term (current) use of anticoagulants: Secondary | ICD-10-CM | POA: Diagnosis not present

## 2024-01-14 DIAGNOSIS — D6869 Other thrombophilia: Secondary | ICD-10-CM | POA: Insufficient documentation

## 2024-01-14 DIAGNOSIS — E669 Obesity, unspecified: Secondary | ICD-10-CM | POA: Insufficient documentation

## 2024-01-14 DIAGNOSIS — I491 Atrial premature depolarization: Secondary | ICD-10-CM | POA: Insufficient documentation

## 2024-01-14 DIAGNOSIS — I480 Paroxysmal atrial fibrillation: Secondary | ICD-10-CM | POA: Diagnosis not present

## 2024-01-14 DIAGNOSIS — I495 Sick sinus syndrome: Secondary | ICD-10-CM | POA: Diagnosis not present

## 2024-01-14 LAB — BASIC METABOLIC PANEL
Anion gap: 9 (ref 5–15)
BUN: 19 mg/dL (ref 8–23)
CO2: 24 mmol/L (ref 22–32)
Calcium: 9 mg/dL (ref 8.9–10.3)
Chloride: 103 mmol/L (ref 98–111)
Creatinine, Ser: 1.34 mg/dL — ABNORMAL HIGH (ref 0.44–1.00)
GFR, Estimated: 39 mL/min — ABNORMAL LOW (ref >60)
Glucose, Bld: 113 mg/dL — ABNORMAL HIGH (ref 70–99)
Potassium: 4 mmol/L (ref 3.5–5.1)
Sodium: 136 mmol/L (ref 135–145)

## 2024-01-14 LAB — MAGNESIUM: Magnesium: 2.1 mg/dL (ref 1.7–2.4)

## 2024-01-14 NOTE — Progress Notes (Signed)
Primary Care Physician: Kristian Covey, MD Primary Cardiologist: Dr Royann Shivers Primary Electrophysiologist: Dr Elberta Fortis Referring Physician: Dr Virginia Crews is a 83 y.o. female with a history of tachybradycardia syndrome s/p PPM, diastolic CHF, MVP, OSA, idiopathic pulmonary fibrosis, CVA, aortic atherosclerosis, atrial fibrillation who presents for follow up in the [facility]. Patient is on Eliquis for a CHADS2VASC score of 8. Her afib burden was noted to be increasing on her device and she was referred to EP for evaluation. She was not felt to be a good ablation candidate and dofetilide was recommended. She is s/p dofetilide admission 4/4-03/08/22.  Patient returns for follow up for atrial fibrillation and dofetilide monitoring. She reports that she has done well from a cardiac standpoint. Her device interrogation on 01/09/24 showed no clinically significant mode switches. No bleeding issues on anticoagulation.   Today, he denies symptoms of palpitations, chest pain, shortness of breath, orthopnea, PND, lower extremity edema, dizziness, presyncope, syncope, bleeding, or neurologic sequela. The patient is tolerating medications without difficulties and is otherwise without complaint today.    Atrial Fibrillation Risk Factors:  she does have symptoms or diagnosis of sleep apnea. she does not have a history of rheumatic fever.   Atrial Fibrillation Management history:  Previous antiarrhythmic drugs: dofetilide  Previous cardioversions: 2018 Previous ablations: none Anticoagulation history: Eliquis   Past Medical History:  Diagnosis Date   Alpha-1-antitrypsin deficiency carrier    Arthritis    "all over"   Atherosclerosis of aorta (HCC)    Atrial fibrillation (HCC)    Back pain    Complication of anesthesia    "I woke up during 2 different procedures" (10/04/2014)   Depression    Diastolic dysfunction    Eating disorder    Frequent UTI     GERD (gastroesophageal reflux disease)    Heart disease    Hypertension    Hypertriglyceridemia    Insulin resistance    Long term current use of anticoagulant    Multiple lung nodules    Muscular deconditioning    MVP (mitral valve prolapse)    Obesity    OSA on CPAP    Osteoarthritis    Osteopenia    unspecified location   Pacemaker    PAF (paroxysmal atrial fibrillation) (HCC)    Positive ANA (antinuclear antibody)    Presbyesophagus    Presence of permanent cardiac pacemaker    Primary osteoarthritis of both hands    neck and spine and hips   Pulmonary fibrosis (HCC)    followed by pulmonolgy   PVC's (premature ventricular contractions)    Shortness of breath    Sinus arrest 10/2014   s/p Medtronic Advisa model A2DR01 serial number [id number]   Sleep apnea    Stroke (HCC) 01/2014   denies deficits on 10/04/2014   Tachy-brady syndrome (HCC) 10/2014   TIA (transient ischemic attack)    Vitamin D deficiency     Current Outpatient Medications  Medication Sig Dispense Refill   Cholecalciferol (VITAMIN D3 PO) Take 1 tablet by mouth daily.     diazepam (VALIUM) 5 MG tablet TAKE 1 TABLET ONCE A DAY AS NEEDED. 30 tablet 0   dofetilide (TIKOSYN) 500 MCG capsule Take 1 capsule (500 mcg total) by mouth 2 (two) times daily. 180 capsule 1   doxycycline (VIBRA-TABS) 100 MG tablet Take 1 tablet (100 mg total) by mouth 2 (two) times daily. 10 tablet 0   ELIQUIS 5 MG TABS  tablet TAKE ONE TABLET BY MOUTH TWICE DAILY 180 tablet 1   fluticasone (FLONASE) 50 MCG/ACT nasal spray Place 1 spray into both nostrils daily. 16 g 2   ipratropium (ATROVENT) 0.06 % nasal spray Place 2 sprays into both nostrils 4 (four) times daily as needed for rhinitis. 15 mL 12   Magnesium 400 MG CAPS Take 400 mg by mouth daily. 30 capsule 0   metoprolol tartrate (LOPRESSOR) 25 MG tablet Take 1/2 tablets (12.5 mg total) by mouth 2 (two) times daily. 90 tablet 2   nitroGLYCERIN (NITROSTAT) 0.4 MG SL tablet Place  1 tablet (0.4 mg total) under the tongue every 5 (five) minutes as needed for chest pain. 25 tablet 1   nystatin cream (MYCOSTATIN) Apply to affected rash twice a day as needed 30 g 0   pantoprazole (PROTONIX) 40 MG tablet Take 1 tablet (40 mg total) by mouth 2 (two) times daily. 180 tablet 3   sertraline (ZOLOFT) 100 MG tablet TAKE ONE TABLET BY MOUTH ONCE DAILY. 90 tablet 3   No current facility-administered medications for this encounter.    ROS- All systems are reviewed and negative except as per the HPI above.  Physical Exam: Vitals:   01/14/24 1441  BP: 112/60  Pulse: 70  Weight: 114.8 kg  Height: 5\' 7"  (1.702 m)     GEN: Well nourished, well developed in no acute distress CARDIAC: Regular rate and rhythm, no murmurs, rubs, gallops RESPIRATORY:  Clear to auscultation without rales, wheezing or rhonchi  ABDOMEN: Soft, non-tender, non-distended EXTREMITIES:  No edema; No deformity    Wt Readings from Last 3 Encounters:  01/14/24 114.8 kg  11/20/23 117.5 kg  10/15/23 116.1 kg    EKG today demonstrates  A paced rhythm Vent. rate 70 BPM PR interval 236 ms QRS duration 74 ms QT/QTcB 408/440 ms   Echo 12/12/22 demonstrated   1. Left ventricular ejection fraction, by estimation, is 60 to 65%. The  left ventricle has normal function. The left ventricle has no regional  wall motion abnormalities. Left ventricular diastolic parameters are  consistent with Grade I diastolic dysfunction (impaired relaxation).   2. Right ventricular systolic function is normal. The right ventricular  size is normal. There is normal pulmonary artery systolic pressure. The  estimated right ventricular systolic pressure is 8.0 mmHg.   3. The mitral valve is degenerative. Mild mitral valve regurgitation. No  evidence of mitral stenosis.   4. The aortic valve is tricuspid. Aortic valve regurgitation is not  visualized. Aortic valve sclerosis is present, with no evidence of aortic  valve  stenosis.   5. The inferior vena cava is normal in size with greater than 50%  respiratory variability, suggesting right atrial pressure of 3 mmHg.    Epic records are reviewed at length today  CHA2DS2-VASc Score = 8  The patient's score is based upon: CHF History: 1 HTN History: 1 Diabetes History: 0 Stroke History: 2 Vascular Disease History: 1 Age Score: 2 Gender Score: 1       ASSESSMENT AND PLAN: Paroxysmal Atrial Fibrillation (ICD10:  I48.0) The patient's CHA2DS2-VASc score is 8, indicating a 10.8% annual risk of stroke.   S/p dofetilide admission 03/2022 Patient maintaining SR Continue dofetilide 500 mcg BID Continue Lopressor 12.5 mg BID  Secondary Hypercoagulable State (ICD10:  D68.69) The patient is at significant risk for stroke/thromboembolism based upon her CHA2DS2-VASc Score of 8.  Continue Apixaban (Eliquis). No bleeding issues.   High Risk Medication Monitoring (ICD 10: Z79.899) QT  interval on ECG appropriate for dofetilide monitoring. Check bmet/mag today.    Obesity Body mass index is 39.63 kg/m.  Encouraged lifestyle modification  OSA  Encouraged nightly CPAP  Tachybradycardia syndrome S/p PPM, followed by Dr Royann Shivers and the device clinic  IPF Followed by Dr Marchelle Gearing Would avoid amiodarone    Follow up in the AF clinic in 6 months.    Jorja Loa PA-C Afib Clinic [facility] [address] [place], [place] [address] [phone]

## 2024-01-20 ENCOUNTER — Encounter: Payer: Self-pay | Admitting: Internal Medicine

## 2024-01-30 DIAGNOSIS — J8410 Pulmonary fibrosis, unspecified: Secondary | ICD-10-CM | POA: Diagnosis not present

## 2024-01-30 DIAGNOSIS — I2510 Atherosclerotic heart disease of native coronary artery without angina pectoris: Secondary | ICD-10-CM | POA: Diagnosis not present

## 2024-01-31 ENCOUNTER — Telehealth: Payer: Self-pay | Admitting: Internal Medicine

## 2024-01-31 NOTE — Telephone Encounter (Signed)
    Arvilla Market - ILD but not on esbriet/ofev.  Calls CCM ansering service - heavings last night and nnight before vomiting x 10 days. Also diarrhea x 1.5 days. Tricky this mroning. But diarrhea maybe better   Plan  = asked her to call [facility] Burchette, Elberta Fortis, MD or go to ER or self hydrate - unable to help further as ccm    SIGNATURE    Dr. Kalman Shan, M.D., F.C.C.P,  Pulmonary and Critical Care Medicine Staff Physician, [facility] Center Director - Interstitial Lung Disease  Program  Pulmonary Fibrosis [facility] at [facility] [place], [place], [address]   Pager: [phone], If no answer  -> Check AMION or Try [phone] Telephone (clinical office): [phone] Telephone (research): [phone]  11:39 AM 01/31/2024

## 2024-02-02 ENCOUNTER — Ambulatory Visit: Payer: Self-pay | Admitting: Family Medicine

## 2024-02-02 NOTE — Telephone Encounter (Signed)
 Copied from CRM [id number]. Topic: Clinical - Red Word Triage >> Feb 02, 2024 10:02 AM Gurney Maxin H wrote: [facility] that prompted transfer to Nurse Triage: Virus or food poisoning not sure, really weak, vomiting, diarrhea,   Chief Complaint: Weakness Symptoms: Weakness Frequency: Acute Pertinent Negatives: Patient denies fever Disposition: [] ED /[x] Urgent Care (no appt availability in office) / [] Appointment(In office/virtual)/ []  [facility]/ [] Home Care/ [] Refused Recommended Disposition /[] [place] [facility]/ []  Follow-up with PCP Additional Notes: BB is being triaged for weakness that is acute in nature, the patient previously experienced episodes of diarrhea and vomiting. The patient currently lives in an independent living facility, and states she can only perform essential ADLs with persisting difficulties. Recommended Urgent Care with the assistance of staff at the facility, patient agreed to plan and verbalized understanding.    Reason for Disposition  [1] MODERATE weakness (i.e., interferes with work, school, normal activities) AND [2] cause unknown  (Exceptions: Weakness from acute minor illness or poor fluid intake; weakness is chronic and not worse.)  Answer Assessment - Initial Assessment Questions 1. DESCRIPTION: "Describe how you are feeling."     Fatigued, Weakened  2. SEVERITY: "How bad is it?"  "Can you stand and walk?"   - MILD (0-3): Feels weak or tired, but does not interfere with work, school or normal activities.   - MODERATE (4-7): Able to stand and walk; weakness interferes with work, school, or normal activities.   - SEVERE (8-10): Unable to stand or walk; unable to do usual activities.     Moderate-Severe  3. ONSET: "When did these symptoms begin?" (e.g., hours, days, weeks, months)      Yesterday  4. CAUSE: "What do you think is causing the weakness or fatigue?" (e.g., not drinking enough fluids, medical problem, trouble sleeping)     Unsure  5.  NEW MEDICINES:  "Have you started on any new medicines recently?" (e.g., opioid pain medicines, benzodiazepines, muscle relaxants, antidepressants, antihistamines, neuroleptics, beta blockers)     No  6. OTHER SYMPTOMS: "Do you have any other symptoms?" (e.g., chest pain, fever, cough, SOB, vomiting, diarrhea, bleeding, other areas of pain)     No  7. PREGNANCY: "Is there any chance you are pregnant?" "When was your last menstrual period?"      No and NO  Protocols used: Weakness (Generalized) and Fatigue-A-AH

## 2024-02-02 NOTE — Telephone Encounter (Signed)
 Agree with advice for urgent evaluation given her above symptoms  Kristian Covey MD [facility]

## 2024-02-04 ENCOUNTER — Ambulatory Visit: Admitting: Emergency Medicine

## 2024-02-04 ENCOUNTER — Encounter: Payer: Self-pay | Admitting: Emergency Medicine

## 2024-02-04 ENCOUNTER — Ambulatory Visit: Payer: Self-pay | Admitting: Internal Medicine

## 2024-02-04 ENCOUNTER — Ambulatory Visit

## 2024-02-04 VITALS — BP 100/60 | HR 66 | Temp 98.1°F | Ht 67.0 in | Wt 250.8 lb

## 2024-02-04 DIAGNOSIS — J209 Acute bronchitis, unspecified: Secondary | ICD-10-CM

## 2024-02-04 DIAGNOSIS — R918 Other nonspecific abnormal finding of lung field: Secondary | ICD-10-CM | POA: Diagnosis not present

## 2024-02-04 DIAGNOSIS — R059 Cough, unspecified: Secondary | ICD-10-CM | POA: Diagnosis not present

## 2024-02-04 MED ORDER — PREDNISONE 20 MG PO TABS: 20.0000 mg | ORAL_TABLET | Freq: Every day | ORAL | 0 refills | Status: DC

## 2024-02-04 MED ORDER — DOXYCYCLINE HYCLATE 100 MG PO TABS: 100.0000 mg | ORAL_TABLET | Freq: Two times a day (BID) | ORAL | 0 refills | Status: DC

## 2024-02-04 NOTE — Progress Notes (Signed)
 Subjective:    [name], female    DOB: [birth year], 83 y.o.   MRN: [id number]  HPI  Acute office visit 02/04/2024:  83 year old former smoker with a history of COPD and alpha-1 antitrypsin deficiency heterozygote, ILD, hypertension, GERD, allergic rhinitis, heart block with a pacemaker, atrial fibrillation, OSA on CPAP.  She is followed by Dr. Marchelle Gearing in our office. She reports that she had gastroenteritis at the beginning of the month and continues to feel very weak.  Had nausea, emesis. Subsequently developed dyspnea, cough with greenish clear phlegm.  Paroxysms of cough. She is not on any scheduled BD therapy.   Most recent chest imaging 11/12/2023 showed chronic interstitial lung disease in a UIP pattern, dilated esophagus with fluid and debris questioning possibility of dysmotility and aspiration.  She had scattered pulmonary nodules on the right question inflammatory including an 8 mm right lower lobe nodule   Review of Systems As per HPI  Past Medical History:  Diagnosis Date   Alpha-1-antitrypsin deficiency carrier    Arthritis    "all over"   Atherosclerosis of aorta (HCC)    Atrial fibrillation (HCC)    Back pain    Complication of anesthesia    "I woke up during 2 different procedures" (10/04/2014)   Depression    Diastolic dysfunction    Eating disorder    Frequent UTI    GERD (gastroesophageal reflux disease)    Heart disease    Hypertension    Hypertriglyceridemia    Insulin resistance    Long term current use of anticoagulant    Multiple lung nodules    Muscular deconditioning    MVP (mitral valve prolapse)    Obesity    OSA on CPAP    Osteoarthritis    Osteopenia    unspecified location   Pacemaker    PAF (paroxysmal atrial fibrillation) (HCC)    Positive ANA (antinuclear antibody)    Presbyesophagus    Presence of permanent cardiac pacemaker    Primary osteoarthritis of both hands    neck and spine and hips   Pulmonary fibrosis  (HCC)    followed by pulmonolgy   PVC's (premature ventricular contractions)    Shortness of breath    Sinus arrest 10/2014   s/p Medtronic Advisa model A2DR01 serial number [id number]   Sleep apnea    Stroke (HCC) 01/2014   denies deficits on 10/04/2014   Tachy-brady syndrome (HCC) 10/2014   TIA (transient ischemic attack)    Vitamin D deficiency      Family History  Problem Relation Age of Onset   Heart attack Mother    Sudden death Mother    Stroke Father    Heart failure Father    Alcoholism Brother    Healthy Sister    Stroke Brother    Alpha-1 antitrypsin deficiency Daughter    Breast cancer Daughter    Prostate cancer Brother    Stroke Brother    Prostate cancer Brother    Healthy Brother    Prostate cancer Brother    Healthy Daughter    Diabetes Son    Arthritis Son      Social History   Socioeconomic History   Marital status: Divorced    Spouse name: Not on file   Number of children: 3   Years of education: college   Highest education level: Not on file  Occupational History   Occupation: Retired Scientist, research (physical sciences)   Occupation: retired  Tobacco Use  Smoking status: Former    Current packs/day: 0.00    Average packs/day: 2.0 packs/day for 18.0 years (36.0 ttl pk-yrs)    Types: Cigarettes    Start date: 07/18/1952    Quit date: 07/18/1970    Years since quitting: 53.5   Smokeless tobacco: Never   Tobacco comments:    Former smoker 07/18/22 quit smoking in 1971  Vaping Use   Vaping status: Never Used  Substance and Sexual Activity   Alcohol use: Yes    Alcohol/week: 0.0 standard drinks of alcohol    Comment: 10/04/2014 "might have a drink a couple times/yr"   Drug use: No   Sexual activity: Never  Other Topics Concern   Not on file  Social History Narrative   Lives at [facility]    Patient is right handed.   Patient has a college degree.   Patient is retired.      Social History      Diet? Awful sweets are a terrible addiction        Do you drink/eat things with caffeine? some      Marital status?          divorced                          What year were you married? 1960      Do you live in a house, apartment, assisted living, condo, trailer, etc.? [facility]       Is it one or more stories? 1st level      How many persons live in your home? 1      Do you have any pets in your home? (please list) no       Highest level of education completed? College graduate      Current or past profession: realtor       Do you exercise?             Not much                          Type & how often? Barely any  - bed exercise for injured knee      Advanced Directives      Do you have a living will? yes      Do you have a DNR form?                                  If not, do you want to discuss one?yes      Do you have signed POA/HPOA for forms? yes      Functional Status      Do you have difficulty bathing or dressing yourself? No       Do you have difficulty preparing food or eating? No       Do you have difficulty managing your medications? No       Do you have difficulty managing your finances? No       Do you have difficulty affording your medications? No  Except eloquist after donut hole - so far I have been able to get samples from cardiologist      Social Drivers of Health   Financial Resource Strain: Low Risk  (01/22/2023)   Overall Financial Resource Strain (CARDIA)    Difficulty of Paying Living Expenses: Not hard at all  Food  Insecurity: No Food Insecurity (01/22/2023)   Hunger Vital Sign    Worried About Running Out of Food in the Last Year: Never true    Ran Out of Food in the Last Year: Never true  Transportation Needs: No Transportation Needs (01/22/2023)   PRAPARE - Administrator, Civil Service (Medical): No    Lack of Transportation (Non-Medical): No  Physical Activity: Sufficiently Active (01/22/2023)   Exercise Vital Sign    Days of Exercise per Week: 4 days    Minutes of  Exercise per Session: 40 min  Recent Concern: Physical Activity - Insufficiently Active (01/22/2023)   Exercise Vital Sign    Days of Exercise per Week: 3 days    Minutes of Exercise per Session: 40 min  Stress: No Stress Concern Present (01/22/2023)   [employer] of Occupational Health - Occupational Stress Questionnaire    Feeling of Stress : Not at all  Social Connections: Unknown (01/22/2023)   Social Connection and Isolation Panel [NHANES]    Frequency of Communication with Friends and Family: More than three times a week    Frequency of Social Gatherings with Friends and Family: More than three times a week    Attends Religious Services: More than 4 times per year    Active Member of [employer] or Organizations: Yes    Attends Banker Meetings: More than 4 times per year    Marital Status: Patient declined  Intimate Partner Violence: Not At Risk (01/22/2023)   Humiliation, Afraid, Rape, and Kick questionnaire    Fear of Current or Ex-Partner: No    Emotionally Abused: No    Physically Abused: No    Sexually Abused: No     Allergies  Allergen Reactions   Cefazolin Hives and Itching    Other reaction(s): severe hives   Pseudoephedrine Hcl     Other reaction(s): palpitations (moderate)   Ergotamine     unknown   Other Other (See Comments)   Pentobarbital     unknown   Pirfenidone     unknown   Caffeine Palpitations     Outpatient Medications Prior to Visit  Medication Sig Dispense Refill   Cholecalciferol (VITAMIN D3 PO) Take 1 tablet by mouth daily.     diazepam (VALIUM) 5 MG tablet TAKE 1 TABLET ONCE A DAY AS NEEDED. 30 tablet 0   dofetilide (TIKOSYN) 500 MCG capsule Take 1 capsule (500 mcg total) by mouth 2 (two) times daily. 180 capsule 1   ELIQUIS 5 MG TABS tablet TAKE ONE TABLET BY MOUTH TWICE DAILY 180 tablet 1   fluticasone (FLONASE) 50 MCG/ACT nasal spray Place 1 spray into both nostrils daily. 16 g 2   ipratropium (ATROVENT) 0.06 % nasal spray  Place 2 sprays into both nostrils 4 (four) times daily as needed for rhinitis. 15 mL 12   Magnesium 400 MG CAPS Take 400 mg by mouth daily. 30 capsule 0   metoprolol tartrate (LOPRESSOR) 25 MG tablet Take 1/2 tablets (12.5 mg total) by mouth 2 (two) times daily. 90 tablet 2   nitroGLYCERIN (NITROSTAT) 0.4 MG SL tablet Place 1 tablet (0.4 mg total) under the tongue every 5 (five) minutes as needed for chest pain. 25 tablet 1   pantoprazole (PROTONIX) 40 MG tablet Take 1 tablet (40 mg total) by mouth 2 (two) times daily. 180 tablet 3   sertraline (ZOLOFT) 100 MG tablet TAKE ONE TABLET BY MOUTH ONCE DAILY. 90 tablet 3   nystatin cream (MYCOSTATIN) Apply  to affected rash twice a day as needed (Patient not taking: Reported on 02/04/2024) 30 g 0   doxycycline (VIBRA-TABS) 100 MG tablet Take 1 tablet (100 mg total) by mouth 2 (two) times daily. (Patient not taking: Reported on 02/04/2024) 10 tablet 0   No facility-administered medications prior to visit.        Objective:   Physical Exam Vitals:   02/04/24 1444  BP: 100/60  Pulse: 66  Temp: 98.1 F (36.7 C)  SpO2: 94%  Weight: 250 lb 12.8 oz (113.8 kg)  Height: 5\' 7"  (1.702 m)   Gen: Pleasant, obese woman, uncomfortable but in no distress,  normal affect  ENT: No lesions,  mouth clear,  oropharynx clear, no postnasal drip  Neck: No JVD, no stridor  Lungs: No use of accessory muscles, frequent coughing with expiratory wheeze and rhonchi associated with a cough.  She does not have any wheezing when she is able to take a smooth breath without a cough  Cardiovascular: RRR, heart sounds normal, no murmur or gallops, no peripheral edema  Musculoskeletal: No deformities, no cyanosis or clubbing  Neuro: alert, awake, non focal  Skin: Warm, no lesions or rash       Assessment & Plan:  Acute bronchitis Viral syndrome, suspect flu although she was not tested.  She is outside the window for Tamiflu at this time.  Consider superimposed  bronchitis versus pneumonia.  Check a chest x-ray today.  I will treat her empirically with doxycycline for 7 days and prednisone for 5 days.  She would likely benefit from bronchodilators but she states that she cannot tolerate these because they all cause her thrush.  I think we reasonable to retry once we get her stabilized and she is willing to do so.  Chest x-ray today Please take doxycycline 100 mg twice a day until completely gone Please take prednisone 20 mg once daily until completely gone Try using over-the-counter Coricidin as directed for cough suppression Continue CPAP at night while sleeping It would be reasonable to retry inhaler medication going forward to see if you tolerate and to see if it helps your breathing.  There may be other medications that will not cause thrush Follow in our office in 2 weeks so we can assess your progress.   Levy Pupa, MD, PhD 02/04/2024, 3:03 PM [facility] [phone] or if no answer before 7:00PM call [phone] For any issues after 7:00PM please call eLink [phone]

## 2024-02-04 NOTE — Assessment & Plan Note (Signed)
 Viral syndrome, suspect flu although she was not tested.  She is outside the window for Tamiflu at this time.  Consider superimposed bronchitis versus pneumonia.  Check a chest x-ray today.  I will treat her empirically with doxycycline for 7 days and prednisone for 5 days.  She would likely benefit from bronchodilators but she states that she cannot tolerate these because they all cause her thrush.  I think we reasonable to retry once we get her stabilized and she is willing to do so.  Chest x-ray today Please take doxycycline 100 mg twice a day until completely gone Please take prednisone 20 mg once daily until completely gone Try using over-the-counter Coricidin as directed for cough suppression Continue CPAP at night while sleeping It would be reasonable to retry inhaler medication going forward to see if you tolerate and to see if it helps your breathing.  There may be other medications that will not cause thrush Follow in our office in 2 weeks so we can assess your progress.

## 2024-02-04 NOTE — Patient Instructions (Addendum)
 Chest x-ray today Please take doxycycline 100 mg twice a day until completely gone Please take prednisone 20 mg once daily until completely gone Try using over-the-counter Coricidin as directed for cough suppression Continue CPAP at night while sleeping It would be reasonable to retry inhaler medication going forward to see if you tolerate and to see if it helps your breathing.  There may be other medications that will not cause thrush Follow in our office in 2 weeks so we can assess your progress.

## 2024-02-04 NOTE — Telephone Encounter (Addendum)
 Chief Complaint: cough Symptoms: croupy-sounding, falls Frequency: this AM Pertinent Negatives: Patient denies fever, SOB, CP, head injury, visible injuries Disposition: [] ED /[] Urgent Care (no appt availability in office) / [x] Appointment(In office/virtual)/ []  [facility]/ [] Home Care/ [] Refused Recommended Disposition /[] [facility]/ []  Follow-up with PCP Additional Notes: Pt c/o "croupy" cough that she woke up this AM. Reports that it sounds like croup, and is productive. Also endorses that she has been dealing with a virus causing V/D. Denies fevers, SOB, CP. Of note, reports having fallen a few times within the past few days d/t weakness--no head injury or visible injuries noted. Pulmonology PAA Kim notified of disposition, patient transferred for scheduling. Patient verbalized understanding and to call back with worsening symptoms.     Reason for Disposition  SEVERE coughing spells (e.g., whooping sound after coughing, vomiting after coughing)  [1] MODERATE weakness (i.e., interferes with work, school, normal activities) AND [2] new-onset or worsening  Answer Assessment - Initial Assessment Questions E2C2 Pulmonary Triage - Initial Assessment Questions "Chief Complaint (e.g., cough, sob, wheezing, fever, chills, sweat or additional symptoms) *Go to specific symptom protocol after initial questions. Croupy cough - a sound I am not used to, sounds like croup  "How long have symptoms been present?" Woke up this AM  Have you tested for COVID or Flu? Note: If not, ask patient if a home test can be taken. If so, instruct patient to call back for positive results. No  MEDICINES:   "Have you used any OTC meds to help with symptoms?" No If yes, ask "What medications?" N/a  "Have you used your inhalers/maintenance medication?" no If yes, "What medications?" N/a  If inhaler, ask "How many puffs and how often?" Note: Review instructions on medication in the  chart. N/a  OXYGEN: "Do you wear supplemental oxygen?" Yes If yes, "How many liters are you supposed to use?" 2-3L PRN  "Do you monitor your oxygen levels?" Yes If yes, "What is your reading (oxygen level) today?" 92, reports her monitor runs lower than MD office HR 82  "What is your usual oxygen saturation reading?"  (Note: Pulmonary O2 sats should be 90% or greater) 94% at office   1. ONSET: "When did the cough begin?"      Woke up with it this AM 2. SEVERITY: "How bad is the cough today?"      It's scary sounding 3. SPUTUM: "Describe the color of your sputum" (none, dry cough; clear, white, yellow, green)     Clear, some green 4. HEMOPTYSIS: "Are you coughing up any blood?" If so ask: "How much?" (flecks, streaks, tablespoons, etc.)     no 5. DIFFICULTY BREATHING: "Are you having difficulty breathing?" If Yes, ask: "How bad is it?" (e.g., mild, moderate, severe)    - MILD: No SOB at rest, mild SOB with walking, speaks normally in sentences, can lie down, no retractions, pulse < 100.    - MODERATE: SOB at rest, SOB with minimal exertion and prefers to sit, cannot lie down flat, speaks in phrases, mild retractions, audible wheezing, pulse 100-120.    - SEVERE: Very SOB at rest, speaks in single words, struggling to breathe, sitting hunched forward, retractions, pulse > 120      Denies, baseline SOB 6. FEVER: "Do you have a fever?" If Yes, ask: "What is your temperature, how was it measured, and when did it start?"     no 7. CARDIAC HISTORY: "Do you have any history of heart disease?" (e.g., heart  attack, congestive heart failure)      Pacemaker CHF 8. LUNG HISTORY: "Do you have any history of lung disease?"  (e.g., pulmonary embolus, asthma, emphysema)     Pulm fibrosis, pulm HTN 9. PE RISK FACTORS: "Do you have a history of blood clots?" (or: recent major surgery, recent prolonged travel, bedridden)     No, just TIA On eliquis - reports no visible bruising/injuries from  fall 10. OTHER SYMPTOMS: "Do you have any other symptoms?" (e.g., runny nose, wheezing, chest pain)       Reports having virus - with V/D Weakness - reports using walker Reports falls a few times within past few days - denies head injury  Protocols used: Cough - Acute Non-Productive-A-AH, Falls and Falling-A-AH

## 2024-02-09 ENCOUNTER — Other Ambulatory Visit: Payer: Self-pay

## 2024-02-09 ENCOUNTER — Ambulatory Visit: Payer: Medicare Other | Admitting: Internal Medicine

## 2024-02-09 ENCOUNTER — Encounter: Payer: Self-pay | Admitting: Internal Medicine

## 2024-02-09 ENCOUNTER — Emergency Department (HOSPITAL_COMMUNITY)
Admission: EM | Admit: 2024-02-09 | Discharge: 2024-02-09 | Discharge status: Against Medical Advice | Attending: Emergency Medicine | Admitting: Emergency Medicine

## 2024-02-09 VITALS — BP 88/58 | HR 71 | Ht 67.0 in | Wt 251.0 lb

## 2024-02-09 DIAGNOSIS — R531 Weakness: Secondary | ICD-10-CM | POA: Insufficient documentation

## 2024-02-09 DIAGNOSIS — Z87891 Personal history of nicotine dependence: Secondary | ICD-10-CM

## 2024-02-09 DIAGNOSIS — R059 Cough, unspecified: Secondary | ICD-10-CM | POA: Diagnosis not present

## 2024-02-09 DIAGNOSIS — J84112 Idiopathic pulmonary fibrosis: Secondary | ICD-10-CM | POA: Diagnosis not present

## 2024-02-09 DIAGNOSIS — R112 Nausea with vomiting, unspecified: Secondary | ICD-10-CM

## 2024-02-09 DIAGNOSIS — R2681 Unsteadiness on feet: Secondary | ICD-10-CM

## 2024-02-09 DIAGNOSIS — R55 Syncope and collapse: Secondary | ICD-10-CM | POA: Diagnosis not present

## 2024-02-09 DIAGNOSIS — I951 Orthostatic hypotension: Secondary | ICD-10-CM | POA: Diagnosis not present

## 2024-02-09 DIAGNOSIS — R42 Dizziness and giddiness: Secondary | ICD-10-CM | POA: Diagnosis not present

## 2024-02-09 DIAGNOSIS — Z5321 Procedure and treatment not carried out due to patient leaving prior to being seen by health care provider: Secondary | ICD-10-CM | POA: Diagnosis not present

## 2024-02-09 DIAGNOSIS — R5383 Other fatigue: Secondary | ICD-10-CM

## 2024-02-09 LAB — CBC
HCT: 35.9 % — ABNORMAL LOW (ref 36.0–46.0)
Hemoglobin: 10.8 g/dL — ABNORMAL LOW (ref 12.0–15.0)
MCH: 27.8 pg (ref 26.0–34.0)
MCHC: 30.1 g/dL (ref 30.0–36.0)
MCV: 92.3 fL (ref 80.0–100.0)
Platelets: 198 10*3/uL (ref 150–400)
RBC: 3.89 MIL/uL (ref 3.87–5.11)
RDW: 14 % (ref 11.5–15.5)
WBC: 11.9 10*3/uL — ABNORMAL HIGH (ref 4.0–10.5)
nRBC: 0 % (ref 0.0–0.2)

## 2024-02-09 NOTE — Patient Instructions (Addendum)
 ICD-10-CM   1. 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 Orthostatic hypotension  I95.1     3. Dizziness  R42     4. Other fatigue  R53.83     5. Nausea and vomiting, unspecified vomiting type  R11.2      She is appears dehydrated.  This because of orthostatic hypotension.  I suspect possible acute kidney injury or low sodium and or definite dehydration  Plan - Counseled and she agreed for emergency medical services to be called to take her to the closest ER  Follow-up - 2 months Dr. Marchelle Gearing or any of the nurse practitioners.;  15-minute visit

## 2024-02-09 NOTE — Progress Notes (Signed)
 @Patient  ID: [name] [name], female    DOB: [birth year], 83 y.o.   MRN: [id number]  Chief Complaint  Patient presents with   Follow-up    Pt. Says she's had some bleeding.     Referring provider: Sigmund Hazel, MD  HPI: 83 year old female former smoker quit in 1971.  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 Patient of Dr. Marchelle Gearing, last seen by pulmonary NP on 04/10/21.   Previous [facility] pulmonary encounter: 04/10/2021 follow-up; Bronchitis, Pulmonary fibrosis Patient returns for a follow-up visit, patient was seen last week for cough, congestion, shortness of breath and chills.  COVID-19 testing was negative.  Patient had been fully vaccinated for COVID-19.  She had no increased oxygen demands.  Patient was given doxycycline for 7 days and prednisone 20 mg for 5 days. Patient complains ongoing cough and congestion , mucus remains green on/off. No fever.  Eating well , no n/v.d. No hemoptysis , orthopnea or edema.  Called in over the weekend and was called in steroid burst. Currently on Prednisone 40mg  daily .  Chest xray showed chronic changes with no acute process.  Lives at [facility] [place] , drives.   04/17/2021 Patient presents today follow-up with PFTs. She did not have PFTs done today because she is still not feeling well. She completed course of doxycycline and has two days left of prednisone. Reports "cold symptoms" described as sinus drainage and cough. Sputum has not real purulence. Energy level is low. States that she feels her head is not right. She is using Flonase ans Claritin. She is short of breath. She completed doxycycline twice with no improvement. She is taking mucinex twice a day. She has not used albuterol rescue inhaler. Her ex husband died two weeks ago, she found out two days ago. Denies depressed feelings.    05/15/2021 -  Dr. Marchelle Gearing Type of  visit: Telephone/Video [name] [name] 83 y.o. - changed face to face visit to telephone visit due to > 81F heat. Wantd to know PFT resuts June 2022 copared to sept 2021. FVC down 1.6%, DLCO down 2.1% (compared to y  2years ago FVC declined 7.7%). On Esbeit since Dec 2021/Jan 2022.  Says Dr C sarted on her ditliazem 04/18/21 and after that esbriet became more intolerable. She had constipationa and retain fluids.  So, she stopped all her medications except  eliquis and anti depressant and anti histamine. This means she stopped esbriet approx a week ago. Says that for several days still not feel better though better today. She is unsure if today is a good day v bad day. But overall feels A Fib is acting up. She wants to restarrtrt esbriet after good w wash out.   She is now using o2 at night but feels is not helping energy levels at day time. I Advised she could return it and then she said is actually helping her and does not want to return it.    08/14/21- Dr. Marchelle Gearing Chief Complaint  Patient presents with   Follow-up    Pt states she has been doing okay since last visit. Still becomes SOB with exertion.     [name] [name] 83 y.o. -returns for follow-up.  Since her last visit because she was having calls with side effects from pirfenidone.  She stopped it.  She says after that she started feeling better.  Despite this she is reporting progressive  decline in dyspnea.  Dyspnea score is listed below.  She uses oxygen with exertion as needed.  I at night she uses CPAP without oxygen.  She is frustrated with pirfenidone.  She asked about the alternative nintedanib.  She recollects that there constraints with it in the setting of heart disease and anticoagulation.  Explained that one of the study showed 0.5% increase incidence and MI with nintedanib.  But not the other studies.  Explained that surgical mechanism of anticoagulation but clinically patient have tolerated.  Explained about GI side effects  particularly diarrhea.  She is nervous and hesitant but finally we took a shared decision making that she would start it at a lower dose given the fact it can be beneficial in reducing risk of progression.  We also discussed various aspects of clinical trials documented below.  She is interested in this as long as it is a medication with a much better side effect profile.  But at this point in time we took a shared decision making to go with standard of care therapy first.  She will have the flu shot today.  She is very nervous about getting mRNA COVID booster because of the side effect profile.  She had significant side effects apparently.  She also had omicron COVID in January 2022 and she is wondering about natural immunity.  However she is aware is COVID everywhere currently.  We took a shared decision making to check IgG antibody levels and adjust the risk threshold accordingly.  09/18/2021- Interim hx  Patient presents today for acute visit for medication reaction to OFEV. Patient called our office yesterday with reports of rectal bleeding and abdominal pain since starting 09/17/21. She is newly on OFEV since 10/13 and is also on Eliquis. Our office advised her to stop OFEV. Rectal bleeding has since resolved, she still has some slight lower abdomen discomfort described as "bloating".  Breathing wise she is stable. She uses POC 2L with exertion as needed.  She is on oxygen at night with her CPAP. Denies f/c/s, diarrhea, nausea or vomitting.     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  Small pulmonary nodules measuring 5 mm or less stable dating back to 2018 consistent with a benign etiology.  OV 09/26/2021  Subjective:  [name], female , DOB: 03-Mar-1941 , age 83 y.o. , MRN: [id number] , ADDRESS: [address] [place] [address] PCP Sigmund Hazel, MD Patient Care Team: Sigmund Hazel, MD as  PCP - General (Family Medicine) Thurmon Fair, MD as PCP - Cardiology (Cardiology) Freddy Finner, MD as Consulting Physician (Obstetrics and Gynecology) Sharrell Ku, MD as Consulting Physician (Gastroenterology) Croitoru, Rachelle Hora, MD as Consulting Physician (Cardiology) Ihor Gully, MD (Inactive) as Consulting Physician (Urology) Arminda Resides, MD as Consulting Physician (Dermatology) Kalman Shan, MD as Consulting Physician (Pulmonary Disease) Pollyann Savoy, MD as Consulting Physician (Rheumatology) Antonietta Barcelona, OD as Referring Physician (Optometry) Sanjuana Letters, MD as Referring Physician (Orthopedic Surgery)  This Provider for this visit: Treatment Team:  Attending Provider: Kalman Shan, MD    09/26/2021 -   Chief Complaint  Patient presents with   Follow-up    PFT performed today.  Pt states she has been doing okay since last visit.   Follow-up IPF   - sept 09, 2021 update - IPF dx givien: age greater than 66, only trace positive ANAdescription of small hiatal hernia on CT scan, the  type of pattern on the CT scan called probable UIP, autoimmune antibodies negative last year    - last CT July 2021   -last PFT sept 201  - portable o2 since sept 2020/02/24  -esbriet start end sept 24-Feb-2020 -> stopped 11/02/20 due to fatigue -> rstar 11/09/2020 -. Max dose 2 pilll tid since Dec 02, 2020 -> intermittent start/stop summer 2022 -> stopped aug 2022  - Overnight pulse oximetry done on 03/12/2021 shows time spent less than equal to 88% is 40.3 minutes.  Average pulse was 60/min.  Time spent in bradycardia 72.5%. Start 2 L nasal cannula at night  Multiple Lung Nodules 7mm- stable 23-Feb-2017 -> July 2021 Alpha-1 MZ but does not have emphysema on CT 02/24/20.  [Daughter died from alpha-1 CC] Obesity  Diastolic dysfunction Atrial fibrillation on Eliquis Obstructive sleep apnea on CPAP without oxygen COVID-19 incidental 12/28/2020 - likely Omicron4  HPI LAKEISA HENINGER 83 y.o.  -presents ith daughter ? Marijo File meeting first time. Doing stable from symptom stand point resp wise but PFT down significantly. Took ofev for 5 days and there are aspectso of taking ofev (100mg  bid - the lower dose) tht she liked. She felt her urinary stream had better flow (not a known side effect). She had lower appetite (known Se with ofev) but she liked the curb on appetie it due to her obesity. The 5d into she had had mild discomfort in suprapubic area (known SE for abd discmofort) and had 1 drop of painless rectal bleed. So stopped ofev (also on eliquisl bleeding theoertical risk with ofev). After stopping ofev, urinary flow is back to baseline(does not like it), and no mpre rectal bleeding . She never had rectal bleeding before and afer. She again stated and made clear the so called bleeding was "1 drop of blood ont he floor". Associted with ofev intake on background of eliquis and associted with suprapubic discomfor. No dysuria. Dr Orlene Plum is her GI. Last colonoscopy was when she ws 74 and was normal apparently  Discussed  - seeing a GI doc for rectal bleed - she is agreeable   - discussed rechallenge with ofev v joining clinical trial phase 3( to be volunteer + exploit therapeutic advantage with 50% chance of placebo) Discussed other asoects of trials. Explained the 1994-02-23 Zephrysu study with IV MAB against CTGF Pamrevulumab v placeo.  See below. She and dauhger want her to do clinical trial but are more enthused about the idea of a ofev rechallenge first. We discussed that rechallenge liketly to cause side effects but anecdotally seems some patients who do not have issues. She is not interested in esbriet rechallenge       OV 10/10/2021  Subjective:  [name], female , DOB: [birth year] , age 27 y.o. , MRN: [id number] , ADDRESS: [address] [place] [address] PCP Sigmund Hazel, MD Patient Care Team: Sigmund Hazel, MD as PCP - General (Family  Medicine) Thurmon Fair, MD as PCP - Cardiology (Cardiology) Freddy Finner, MD as Consulting Physician (Obstetrics and Gynecology) Sharrell Ku, MD as Consulting Physician (Gastroenterology) Croitoru, Rachelle Hora, MD as Consulting Physician (Cardiology) Ihor Gully, MD (Inactive) as Consulting Physician (Urology) Arminda Resides, MD as Consulting Physician (Dermatology) Kalman Shan, MD as Consulting Physician (Pulmonary Disease) Pollyann Savoy, MD as Consulting Physician (Rheumatology) Antonietta Barcelona, OD as Referring Physician (Optometry) Sanjuana Letters, MD as Referring Physician (Orthopedic Surgery)  This Provider for this visit: Treatment Team:  Attending Provider: Kalman Shan,  MD    10/10/2021 -   Chief Complaint  Patient presents with   Follow-up    Patient states that  she is doing good with the Ofev and is currently not having any side effects. States that she has been coughing a lot lately. Wants to know if she is due for pneumonia vaccine.   Type of visit: Telephone/Video Circumstance: COVID-19 national emergency Identification of patient ZAKIA SAINATO with 1941-01-04 and MRN [id number] - 2 person identifier Risks: Risks, benefits, limitations of telephone visit explained. Patient understood and verbalized agreement to proceed Anyone else on call: just patient Patient location: her home This provider location: [address], [place], [place] [address]   HPI CAYENNE BREAULT 83 y.o. -at this point telephone visit is to see how rechallenge with nintedanib is ongoing.  Since 10/06/2021 Saturday she is on 100 mg twice daily.  She says she has not had any recurrence of bleeding.  No GI symptoms.  Tolerating it well.  Dyspnea is not any worse.  No new issues.  Noticed mild reduction in GFR in October 2022.  Liver function test and hemoglobin are fine.             OV 11/09/2021  Subjective:  [name], female , DOB: 07-Sep-1941 , age 31 y.o.  , MRN: [id number] , ADDRESS: [address] [place] [address] PCP Sigmund Hazel, MD Patient Care Team: Sigmund Hazel, MD as PCP - General (Family Medicine) Croitoru, Rachelle Hora, MD as PCP - Cardiology (Cardiology) Freddy Finner, MD as Consulting Physician (Obstetrics and Gynecology) Sharrell Ku, MD as Consulting Physician (Gastroenterology) Croitoru, Rachelle Hora, MD as Consulting Physician (Cardiology) Ihor Gully, MD (Inactive) as Consulting Physician (Urology) Arminda Resides, MD as Consulting Physician (Dermatology) Kalman Shan, MD as Consulting Physician (Pulmonary Disease) Pollyann Savoy, MD as Consulting Physician (Rheumatology) Antonietta Barcelona, OD as Referring Physician (Optometry) Sanjuana Letters, MD as Referring Physician (Orthopedic Surgery)  This Provider for this visit: Treatment Team:  Attending Provider: Kalman Shan, MD    11/09/2021 -   Chief Complaint  Patient presents with   Follow-up    Pt states she is about the same since last visit. Still becomes SOB with exertion.    HPI JASMYN PICHA 83 y.o. -returns for follow-up.  She restarted nintedanib on 10/06/2021 at the lower dose.  She tells me that she has not had any rectal bleeding except yesterday there was again a few drops.  No abdominal pain.  Last visit I told her to go see GI doctor but she has not.  Her GI doctor was Dr. Matthias Hughs in [facility] but has since retired.  Advised that that I will make a referral.  In terms of shortness of breath she is stable.  She believes she is tolerating the nintedanib fine.  She is actually gained some weight.  She also reports that for the last 2 weeks she is on metoprolol because of cardiac rhythm issues.  At today blood pressure was 136 systolic.Marland Kitchen  She tells me this is high for her.  She has never had this problem before according to history.  But the observations only today.  She acknowledges that this could be whitecoat hypertension as well.  Last  CT scan of the chest July 2021 Last pulmonary function test October 2022  She is not fully compliant with the portable oxygen.     No results found.     OV 05/31/2022  Subjective:  Patient ID:  [name] [name], female , DOB: [birth year] , age 27 y.o. , MRN: [id number] , ADDRESS: [address] [place] [address] PCP Sigmund Hazel, MD Patient Care Team: Sigmund Hazel, MD as PCP - General (Family Medicine) Thurmon Fair, MD as PCP - Cardiology (Cardiology) Freddy Finner, MD as Consulting Physician (Obstetrics and Gynecology) Sharrell Ku, MD as Consulting Physician (Gastroenterology) Croitoru, Rachelle Hora, MD as Consulting Physician (Cardiology) Ihor Gully, MD (Inactive) as Consulting Physician (Urology) Arminda Resides, MD as Consulting Physician (Dermatology) Kalman Shan, MD as Consulting Physician (Pulmonary Disease) Pollyann Savoy, MD as Consulting Physician (Rheumatology) Antonietta Barcelona, OD as Referring Physician (Optometry) Sanjuana Letters, MD as Referring Physician (Orthopedic Surgery)  This Provider for this visit: Treatment Team:  Attending Provider: Kalman Shan, MD   05/31/2022 -   Chief Complaint  Patient presents with   Follow-up    Pt states she is still having problems with her breathing. Also states that she feels like she is more constipated than before and feels like she might have some internal bleeding.     HPI KENDYL FESTA 83 y.o. -on finger for several months.  She believes IPF is slowly getting worse.  Review of the symptom score shows that symptoms are definitely worse in the last 2 years but compared to 6 months ago around the same.  Shared high-resolution CT chest that shows progression of ILD over 2 years.  I shared the results with her.  This fits in with the symptoms also getting worse over the last 2 years.  She is worried about the progression.  In the past we recommended she consider clinical trial as a care  option but she declined.  This is still under consideration for her but she has not made up her mind.  Last echocardiogram was a few years ago.  Of note she has had rectal bleeding last year.  I referred her to Dr. Carole Binning and GI.  She had virtual colonoscopy.  She asked me to read the report.  It shows she has diverticulosis but does not seem anything hugely abnormal.  She is unsure if she still having rectal bleeding.  She thinks she might be.  She wants to switch GI doctors   She also tells me that since last visit she is on a new medicine for atrial fibrillation.  Tikosyn.  She wanted me to palpate a pulse.  I did this for 30 seconds and told her at least for the 30 seconds she is in sinus rhythm.     OV 09/03/2022  Subjective:  [name], female , DOB: 12/14/1940 , age 29 y.o. , MRN: [id number] , ADDRESS: [address] [place] [address] PCP Sigmund Hazel, MD Patient Care Team: Sigmund Hazel, MD as PCP - General (Family Medicine) Thurmon Fair, MD as PCP - Cardiology (Cardiology) Freddy Finner, MD (Inactive) as Consulting Physician (Obstetrics and Gynecology) Sharrell Ku, MD as Consulting Physician (Gastroenterology) Croitoru, Rachelle Hora, MD as Consulting Physician (Cardiology) Ihor Gully, MD (Inactive) as Consulting Physician (Urology) Arminda Resides, MD as Consulting Physician (Dermatology) Kalman Shan, MD as Consulting Physician (Pulmonary Disease) Pollyann Savoy, MD as Consulting Physician (Rheumatology) Antonietta Barcelona, OD as Referring Physician (Optometry) Sanjuana Letters, MD as Referring Physician (Orthopedic Surgery)  This Provider for this visit: Treatment Team:  Attending Provider: Kalman Shan, MD   09/03/2022 -   Chief Complaint  Patient presents with   Follow-up    Breathing is about the  same. She has some dry cough in the am.      HPI NIKOLETTA VARMA 83 y.o. -returns for follow-up.  Today's visit has  been compressed because instead of scheduling on 30 minutes she is on a 15-minute schedule.  For some reason the pulmonary function test is also not done.  In addition scheduling at the front desk was late.  Overall any event she is feeling stable she is tolerating 100 mg twice daily of nintedanib.  She is doing physical therapy and swimming and she is feeling stronger.  She does have some mild diarrhea on and off.  She has not had any more bright blood per rectum.  She is not sure if a GI appointment has gone through.  She says she is still waiting for that appointment.  Last set of labs was in August 2023.  She does have some amount of CKD  Review of the vaccination shows that she will have high-dose flu shot today she is agreed for this.  She is hesitant to get COVID mRNA vaccine.    CT Chest data  No results found.   OV 12/31/2022  Subjective:  [name], female , DOB: Apr 14, 1941 , age 51 y.o. , MRN: [id number] , ADDRESS: [address] [place] [address] PCP Kristian Covey, MD Patient Care Team: Kristian Covey, MD as PCP - General (Family Medicine) Croitoru, Rachelle Hora, MD as PCP - Cardiology (Cardiology) Freddy Finner, MD (Inactive) as Consulting Physician (Obstetrics and Gynecology) Sharrell Ku, MD as Consulting Physician (Gastroenterology) Croitoru, Rachelle Hora, MD as Consulting Physician (Cardiology) Ihor Gully, MD (Inactive) as Consulting Physician (Urology) Arminda Resides, MD as Consulting Physician (Dermatology) Kalman Shan, MD as Consulting Physician (Pulmonary Disease) Pollyann Savoy, MD as Consulting Physician (Rheumatology) Antonietta Barcelona, OD as Referring Physician (Optometry) Sanjuana Letters, MD as Referring Physician (Orthopedic Surgery)  This Provider for this visit: Treatment Team:  Attending Provider: Kalman Shan, MD   12/31/2022 -   Chief Complaint  Patient presents with   Follow-up    She missed  her appt for PFT today. Breathing is worse over the past 5-6 wks. She is getting her resp virus- took mucinex and her cough has improved. She states that she gets winded walking approx 15 ft. She is exercising 4 x per wk- has lost seven lbs since last visit here 11/15/22. She stopped OFEV approx 2 months ago due to diarrhea.     HPI KEARSTON PUTMAN 83 y.o. -returns for follow-up.  In the interim she stopped nintedanib because of diarrhea.  This was 2 months ago.  She is exercising and has lost some weight see below.  Symptoms are stable.  Walking desaturation test is stable.  She did not show up for a pulmonary function test today.  She tells me that the scheduler confused her when she called to confirm the appointment.  They told her the appointment is 1:00 but they forgot to inform her about the PFT and therefore she forgot about the PFT.  This is according to her.  At this point in time she is content with supportive care without any antifibrotic.  She is not interested in clinical trials.  I discussed this again with her. Unable to do glutathione OPTC due to lack of availabiity      OV 07/22/2023  Subjective:  [name], female , DOB: 08/30/1941 , age 62 y.o. , MRN: [id number] , ADDRESS: [address] [place] [address] PCP Caryl Never, Elberta Fortis, MD Patient Care Team: Kristian Covey, MD as PCP - General (Family Medicine) Thurmon Fair, MD as PCP - Cardiology (Cardiology) Freddy Finner, MD (Inactive) as Consulting Physician (Obstetrics and Gynecology) Sharrell Ku, MD as Consulting Physician (Gastroenterology) Croitoru, Rachelle Hora, MD as Consulting Physician (Cardiology) Ihor Gully, MD (Inactive) as Consulting Physician (Urology) Arminda Resides, MD as Consulting Physician (Dermatology) Kalman Shan, MD as Consulting Physician (Pulmonary Disease) Pollyann Savoy, MD as Consulting Physician (Rheumatology) Antonietta Barcelona, OD as Referring  Physician (Optometry) Sanjuana Letters, MD as Referring Physician (Orthopedic Surgery)  This Provider for this visit: Treatment Team:  Attending Provider: Kalman Shan, MD    07/22/2023 -   Chief Complaint  Patient presents with   Follow-up    F/up      HPI [name] [name] 83 y.o. -returns for IPF follow-up.  She is in supportive care.  Last pulmonary function test was in October 2022 almost 2 years ago.  Last CT scan of the chest in June 2023 just over a year ago.  Last visit was in January 2024.  Overall she says she is stable.  She is not taking any antifibrotic's.  She says her atrial fibrillation is in control/sinus rhythm.  She did see Dr. C8/15/24.  She was in the ED in July 2024 for dizziness and an in the ER and office visit April May 2024 for osteoarthritis of the right hip.  Frequent falls.   OV 10/01/2023  Subjective:  [name], female , DOB: August 07, 1941 , age 71 y.o. , MRN: [id number] , ADDRESS: [address] [place] [address] PCP Kristian Covey, MD Patient Care Team: Kristian Covey, MD as PCP - General (Family Medicine) Croitoru, Rachelle Hora, MD as PCP - Cardiology (Cardiology) Freddy Finner, MD (Inactive) as Consulting Physician (Obstetrics and Gynecology) Sharrell Ku, MD as Consulting Physician (Gastroenterology) Croitoru, Rachelle Hora, MD as Consulting Physician (Cardiology) Ihor Gully, MD (Inactive) as Consulting Physician (Urology) Arminda Resides, MD as Consulting Physician (Dermatology) Kalman Shan, MD as Consulting Physician (Pulmonary Disease) Pollyann Savoy, MD as Consulting Physician (Rheumatology) Antonietta Barcelona, OD as Referring Physician (Optometry) Sanjuana Letters, MD as Referring Physician (Orthopedic Surgery)  This Provider for this visit: Treatment Team:  Attending Provider: Kalman Shan, MD    10/01/2023 -   Chief Complaint  Patient presents with   Follow-up    Breathing has  been gradually getting worse since the last visit. She has cough with clear to green sputum.      HPI LYNNETT LANGLINAIS 83 y.o. -last seen in August 2024.  Returns for follow-up.  Since then she is more short of breath she says.  This with exertion relieved by rest.  Her walking desaturation test seemed around the same.  Her weight is around the same.  There are no major new health issues other than falls in the ER visit due to vertigo.  Pulmonary function test shows a decline in FVC by 15% approximately over 2 years.  She is worried about this.  She still does not want to do antifibrotic's.  Of note she also had a worsening cough but when she took a nasal spray a month ago or so it got better but today she is some green mucus she is agreed to take antibiotics.   Regarding her fibrosis symptom score shows worsening. Weight disc treatment options: She does not want to do approved antifibrotic's Discussed clinical  trials as a care option: She might be interested in this.  In the past she has been reluctant.  She is really afraid of the side effects of drugs.  Discussed about VIXARELIMAB monoclonal antibody with acceptable side effect profile currently under investigation versus placebo.  She might be interested in this to get the consent form.  She again is therapeutic misconception about getting placebo.  We went over this and the concept of clinical trial and drug development.  She is going take the consent and read it.  She verbalized understanding.  She is not using her night oxygen.    OV 02/09/2024  Subjective:  [name], female , DOB: 26-Jul-1941 , age 42 y.o. , MRN: [id number] , ADDRESS: [address] [place] [address] PCP Kristian Covey, MD Patient Care Team: Kristian Covey, MD as PCP - General (Family Medicine) Croitoru, Rachelle Hora, MD as PCP - Cardiology (Cardiology) Freddy Finner, MD (Inactive) as Consulting Physician (Obstetrics and  Gynecology) Sharrell Ku, MD as Consulting Physician (Gastroenterology) Croitoru, Rachelle Hora, MD as Consulting Physician (Cardiology) Ihor Gully, MD (Inactive) as Consulting Physician (Urology) Arminda Resides, MD as Consulting Physician (Dermatology) Kalman Shan, MD as Consulting Physician (Pulmonary Disease) Pollyann Savoy, MD as Consulting Physician (Rheumatology) Antonietta Barcelona, OD as Referring Physician (Optometry) Sanjuana Letters, MD as Referring Physician (Orthopedic Surgery)  This Provider for this visit: Treatment Team:  Attending Provider: Kalman Shan, MD    Follow-up IPF   - sept 09, 2021 update - IPF dx givien: age greater than 44, only trace positive ANAdescription of small hiatal hernia on CT scan, the type of pattern on the CT scan called probable UIP, autoimmune antibodies negative last year    - last CT July 2021   -last PFT October 2022  - portable o2 since sept Mar 17, 2020  -esbriet start end sept 03/17/20 -> stopped 11/02/20 due to fatigue -> rstar 11/09/2020 -. Max dose 2 pilll tid since Dec 02, 2020 -> intermittent start/stop summer 03/17/2021 -> stopped aug 2022  - OFEV start Nov 2022  - low dose -> stopped March 17, 2022 due to sid effects   - LAST HRT June 2023   Nocutruanl hyoxeia   - Overnight pulse oximetry done on 03/12/2021 shows time spent less than equal to 88% is 40.3 minutes.  Average pulse was 60/min.  Time spent in bradycardia 72.5%. Start 2 L nasal cannula at night  Multiple Lung Nodules 7mm- stable 03-17-2017 -> July 2021 Alpha-1 MZ but does not have emphysema on CT 03-17-2020.  [Daughter died from alpha-1 CC] Obesity  Diastolic dysfunction Atrial fibrillation on Eliquis Obstructive sleep apnea on CPAP without oxygen COVID-19 incidental 12/28/2020 - likely Omicron4   02/09/2024 -   Chief Complaint  Patient presents with   Follow-up    Breathing is about the same. She is recovering from stomach virus.      HPI RASHENA DOWLING 83 y.o. -IPF patient not on  antifibrotic's.  Last seen by myself for regular IPF visit in October 2024.  At this visit today is to schedule IPF follow-up.  However in the interim on 01/31/2024 she called the answering service with nausea and vomiting for 10 days and diarrhea for 1-1/2 days.  Advised her to call the primary care physician and she was advised to go to urgent care but it is unclear if she actually went to urgent care.  Then she made an acute appointment with pulmonary clinic 02/04/2024 and were described  as a bronchitis and was given 7 days of doxycycline and 5 days of prednisone.  She decided to keep the scheduled appointment today 02/09/2024.  She comes in and she gives a rambling history of feeling fatigued.  She says the original nausea and vomiting improved but then when she coughs she gets significant vomit.  At 1 point she said she was feeling better but then she said she was not feeling better.  She says she is feeling tired.  She says she did not call EMS because it is expensive.  She believes the shortness of breath is the same but today when she thought she was getting late to the appointment she felt more short of breath and when she felt the cold air outside she felt better.  Here in the office she looks little bit more tired than usual and she was definitely orthostatic.  Supine she was saying she was better but when she stood up she could not stand for anything more than 30 seconds.  She was extremely wobbly and unsteady.  This is quite different from what we have seen her in the past.  This repeated 2 times in the office.  Therefore we called EMS.   SUPINE 122/65 HR 70 SITTING 80/60 STANDING  could NOT stand x 2 due to unsteadiness, dizziness,     SYMPTOM SCALE - ILD 07/21/2019   08/10/2020 262# 09/26/2020 Last Weight  Most recent update: 09/26/2020  1:42 PM      Weight  120.9 kg (266 lb 9.6 oz)                     11/09/2020 274# 01/23/2021 279# 08/14/2021 275# 09/18/2021 Started OFEV 10/13 but  stoped 10/17 d/t GIB  09/26/2021 Off ofev. No more "rectal bleed" 11/09/2021 Back on ofev 100mg  bid since 10/06/21  280# 05/31/2022 276# 12/31/2022 260# 10/01/2023   O2 use ra ra ra o2 with ex 02 with ex   O2 with exertoin       Shortness of Breath 0 -> 5 scale with 5 being worst (score 6 If unable to do)                  At rest 0 0   5 0 4 0 0 0 0 0 0  Simple tasks - showers, clothes change, eating, shaving 4 3   2 2 2 5  3.5 4 4 3 4   Household (dishes, doing bed, laundry) 4 3   2 3 2 4 4 4 4  x 4.5  Shopping 5 3   2 3 2  4.5 4 4.5 4 5  4.5  Walking level at own pace 5 3   2 3 2 4 5  4.5 3 3  4.5  Walking up Stairs 5 5   1 5 6 6 6 6 6 5 5   Total (40 - 48) Dyspnea Score 23 17   14 16 18  23.5 22.5 23 21 16  22.5  How bad is your cough? 1 1-3 1 1 2 2 1 1  1 2  3.5  How bad is your fatigue 4 3 3 4 4 3 3  3.5 3 4 4  0  nausea   0 0 0 0 0 0 0 0 0.25 2.5 00  vomit   0 0 0 0 0 0 0 0 0 1.5 0  diarrhea   0 0 0 1 0 0.5 0 00 Once in a while constipaged   axiety   2 1 0 0  0.5 0.5 0 0 0 x 0  depression   2 1.5 2 2 2 1  0.5-1 1 3  3+ 2        Simple office walk 185 feet x  3 laps goal with forehead probe 07/21/2019   08/10/2020   01/23/2021   09/18/2021  05/31/2022  12/31/2022 No show for PFT 10/01/2023    O2 used ra ra ra RA ra ra ra   Number laps completed 3 laps 3 3 2  Only 1 of 3 laps Did  2 of 3 1 of 3   Comments about pace modertae with walker Slow pace Sow pace Slow pace slow     Resting Pulse Ox/HR 97% and 78/min 100% and 74/min 94%and HR 92 96% RA and HR 80 98% and HR 64 98%  95%   Final Pulse Ox/HR 93% and 109/min 77% and 85/min 90 % and HR 100 90% and HR 125 97% and HR 105 95% 91%   Desaturated </= 88% no no no No no     Desaturated <= 3% points Yes, 4 Yes, 13 points Yes, 4 piints Yes, 6 points no     Got Tachycardic >/= 90/min you have pulmonary fibrosis otherwise call interstitial lung disease yes no yes Yes yes     Symptoms at end of test dyspnea Mild dyspnea Dyspnea throughtou Dyspnea  Mod  dspnea     Miscellaneous comments x Corrected with 2LNC and got    X  Very deconditioned         PFT     Latest Ref Rng & Units 09/04/2023    4:05 PM 09/26/2021    2:58 PM 05/07/2021    3:12 PM 08/10/2020   10:03 AM 07/19/2019    3:20 PM 02/24/2017   11:58 AM  PFT Results  FVC-Pre L 2.14  2.38  2.53  2.57  2.74  2.58   FVC-Predicted Pre % 73  79  84  84  93  85   FVC-Post L     2.63  2.45   FVC-Predicted Post %     89  81   Pre FEV1/FVC % % 84  87  86  86  86  82   Post FEV1/FCV % %     90  87   FEV1-Pre L 1.80  2.06  2.17  2.22  2.36  2.12   FEV1-Predicted Pre % 83  92  97  97  107  93   FEV1-Post L     2.35  2.14   DLCO uncorrected ml/min/mmHg 12.22  9.76  15.50  15.83  16.52  16.15   DLCO UNC% % 59  47  74  76  81  59   DLCO corrected ml/min/mmHg 12.22  9.48  15.50  15.34   14.93   DLCO COR %Predicted % 59  45  74  73   55   DLVA Predicted % 98  77  105  97  104  79   TLC L    4.50  3.97    TLC % Predicted %    81  74    RV % Predicted %    71  52         LAB RESULTS last 96 hours No results found.       has a past medical history of Alpha-1-antitrypsin deficiency carrier, Arthritis, Atherosclerosis of aorta (HCC), Atrial fibrillation (HCC), Back pain, Complication of anesthesia, Depression, Diastolic dysfunction, Eating disorder, Frequent  UTI, GERD (gastroesophageal reflux disease), Heart disease, Hypertension, Hypertriglyceridemia, Insulin resistance, Long term current use of anticoagulant, Multiple lung nodules, Muscular deconditioning, MVP (mitral valve prolapse), Obesity, OSA on CPAP, Osteoarthritis, Osteopenia, Pacemaker, PAF (paroxysmal atrial fibrillation) (HCC), Positive ANA (antinuclear antibody), Presbyesophagus, Presence of permanent cardiac pacemaker, Primary osteoarthritis of both hands, Pulmonary fibrosis (HCC), PVC's (premature ventricular contractions), Shortness of breath, Sinus arrest (10/2014), Sleep apnea, Stroke (HCC) (01/2014), Tachy-brady syndrome  (HCC) (10/2014), TIA (transient ischemic attack), and Vitamin D deficiency.   reports that she quit smoking about 53 years ago. Her smoking use included cigarettes. She started smoking about 71 years ago. She has a 36 pack-year smoking history. She has never used smokeless tobacco.  Past Surgical History:  Procedure Laterality Date   CARDIOVERSION N/A 09/02/2017   Procedure: CARDIOVERSION;  Surgeon: Thurmon Fair, MD;  Location: [facility];  Service: Cardiovascular;  Laterality: N/A;   COLONOSCOPY  2019   ESOPHAGEAL DILATION     EXCISION VAGINAL CYST     benign nodules   INSERT / REPLACE / REMOVE PACEMAKER  10/04/2014   Medtronic Advisa model A2DR01 serial number [id number]   LOOP RECORDER EXPLANT N/A 10/04/2014   Procedure: LOOP RECORDER EXPLANT;  Surgeon: Thurmon Fair, MD;  Location: [facility];  Service: Cardiovascular;  Laterality: N/A;   LOOP RECORDER IMPLANT N/A 04/19/2014   Procedure: LOOP RECORDER IMPLANT;  Surgeon: Thurmon Fair, MD;  Location: [facility];  Service: Cardiovascular;  Laterality: N/A;   NM MYOCAR PERF WALL MOTION  12/18/2007   normal   PERMANENT PACEMAKER INSERTION N/A 10/04/2014   Procedure: PERMANENT PACEMAKER INSERTION;  Surgeon: Thurmon Fair, MD;  Location: [facility];  Service: Cardiovascular;  Laterality: N/A;   TUBAL LIGATION     US ECHOCARDIOGRAPHY  05/15/2010   LA mildly dilated,mild mitral annular ca+, AOV mildly sclerotic, mild asymmetric LVH    Allergies  Allergen Reactions   Cefazolin Hives and Itching    Other reaction(s): severe hives   Pseudoephedrine Hcl     Other reaction(s): palpitations (moderate)   Ergotamine     unknown   Other Other (See Comments)   Pentobarbital     unknown   Pirfenidone     unknown   Caffeine Palpitations    Immunization History  Administered Date(s) Administered   Fluad Quad(high Dose 65+) 08/14/2021, 09/03/2022   Fluad Trivalent(High Dose 65+) 10/01/2023   Influenza Split 10/09/2009, 09/19/2010,  09/13/2020   Influenza Whole 09/23/2008   Influenza, High Dose Seasonal PF 09/13/2018, 09/27/2019, 09/26/2020   Influenza,inj,quad, With Preservative 09/04/2017, 09/05/2021   Influenza-Unspecified 09/17/2014, 09/09/2016, 09/04/2017   Moderna Sars-Covid-2 Vaccination 12/06/2019, 01/03/2020   PNEUMOCOCCAL CONJUGATE-20 10/15/2023   Pneumococcal Conjugate-13 09/29/2014, 10/16/2015   Pneumococcal Polysaccharide-23 04/06/2009, 10/02/2017   Pneumococcal-Unspecified 10/01/2014, 10/02/2017   Td 04/05/1999   Tdap 04/06/2009   Zoster Recombinant(Shingrix) 09/29/2018, 01/19/2019   Zoster, Live 12/02/2008, 09/29/2018, 01/19/2019    Family History  Problem Relation Age of Onset   Heart attack Mother    Sudden death Mother    Stroke Father    Heart failure Father    Alcoholism Brother    Healthy Sister    Stroke Brother    Alpha-1 antitrypsin deficiency Daughter    Breast cancer Daughter    Prostate cancer Brother    Stroke Brother    Prostate cancer Brother    Healthy Brother    Prostate cancer Brother    Healthy Daughter    Diabetes Son    Arthritis Son  Current Outpatient Medications:    Cholecalciferol (VITAMIN D3 PO), Take 1 tablet by mouth daily., Disp: , Rfl:    diazepam (VALIUM) 5 MG tablet, TAKE 1 TABLET ONCE A DAY AS NEEDED., Disp: 30 tablet, Rfl: 0   dofetilide (TIKOSYN) 500 MCG capsule, Take 1 capsule (500 mcg total) by mouth 2 (two) times daily., Disp: 180 capsule, Rfl: 1   doxycycline (VIBRA-TABS) 100 MG tablet, Take 1 tablet (100 mg total) by mouth 2 (two) times daily., Disp: 14 tablet, Rfl: 0   ELIQUIS 5 MG TABS tablet, TAKE ONE TABLET BY MOUTH TWICE DAILY, Disp: 180 tablet, Rfl: 1   fluticasone (FLONASE) 50 MCG/ACT nasal spray, Place 1 spray into both nostrils daily., Disp: 16 g, Rfl: 2   ipratropium (ATROVENT) 0.06 % nasal spray, Place 2 sprays into both nostrils 4 (four) times daily as needed for rhinitis., Disp: 15 mL, Rfl: 12   Magnesium 400 MG CAPS, Take 400  mg by mouth daily., Disp: 30 capsule, Rfl: 0   metoprolol tartrate (LOPRESSOR) 25 MG tablet, Take 1/2 tablets (12.5 mg total) by mouth 2 (two) times daily., Disp: 90 tablet, Rfl: 2   nitroGLYCERIN (NITROSTAT) 0.4 MG SL tablet, Place 1 tablet (0.4 mg total) under the tongue every 5 (five) minutes as needed for chest pain., Disp: 25 tablet, Rfl: 1   nystatin cream (MYCOSTATIN), Apply to affected rash twice a day as needed, Disp: 30 g, Rfl: 0   pantoprazole (PROTONIX) 40 MG tablet, Take 1 tablet (40 mg total) by mouth 2 (two) times daily., Disp: 180 tablet, Rfl: 3   sertraline (ZOLOFT) 100 MG tablet, TAKE ONE TABLET BY MOUTH ONCE DAILY., Disp: 90 tablet, Rfl: 3      Objective:   Vitals:   02/09/24 1614  BP: (!) 88/58  Pulse: 71  SpO2: 96%  Weight: 251 lb (113.9 kg)  Height: 5\' 7"  (1.702 m)    Estimated body mass index is 39.31 kg/m as calculated from the following:   Height as of this encounter: 5\' 7"  (1.702 m).   Weight as of this encounter: 251 lb (113.9 kg).  @WEIGHTCHANGE @  [employer]   02/09/24 1614  Weight: 251 lb (113.9 kg)     Physical Exam   General: No distress.  Looks a little bit more exhausted than usual O2 at rest: no Cane present: no Sitting in wheel chair: no Frail: no Obese: yes Neuro: Alert and Oriented x 3. GCS 15. Speech normal but she was extremely unsteady and could not stand for more than 30 seconds and felt very dizzy. Psych: Pleasant Resp:  Barrel Chest - no.  Wheeze - no, Crackles - yes, No overt respiratory distress CVS: Normal heart sounds. Murmurs - no Ext: Stigmata of Connective Tissue Disease - no  HEENT: Normal upper airway. PEERL +. No post nasal drip        Assessment:       ICD-10-CM   1. 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 Orthostatic hypotension  I95.1     3. Dizziness  R42     4. Other fatigue  R53.83     5. Nausea and vomiting, unspecified vomiting type  R11.2     6. Unsteadiness  R26.81           Plan:     Patient Instructions     ICD-10-CM   1. 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 Orthostatic hypotension  I95.1  3. Dizziness  R42     4. Other fatigue  R53.83     5. Nausea and vomiting, unspecified vomiting type  R11.2      She is appears dehydrated.  This because of orthostatic hypotension.  I suspect possible acute kidney injury or low sodium and or definite dehydration  Plan - Counseled and she agreed for emergency medical services to be called to take her to the closest ER  Follow-up - 2 months Dr. Marchelle Gearing or any of the nurse practitioners.;  15-minute visit   FOLLOWUP Return in about 8 weeks (around 04/05/2024) for with Dr Marchelle Gearing, with any of the APPS.    SIGNATURE    Dr. Kalman Shan, M.D., F.C.C.P,  Pulmonary and Critical Care Medicine Staff Physician, [facility] Director - Interstitial Lung Disease  Program  Pulmonary Fibrosis [facility] [place], [place], [address]  Pager: [phone], If no answer or between  15:00h - 7:00h: call [phone] Telephone: [phone]  5:06 PM 02/09/2024

## 2024-02-09 NOTE — ED Notes (Signed)
Pt stated she was leaving at this time.

## 2024-02-09 NOTE — ED Triage Notes (Signed)
 Pt states that she has been having weakness x 10 days with exertion. Also states that she had several near syncopal episodes. Pt is alert and oriented and is able to ambulate with a cane. No fever or vomiting reported.

## 2024-02-12 NOTE — Progress Notes (Signed)
 Remote pacemaker transmission.

## 2024-02-12 NOTE — Addendum Note (Signed)
 Addended by: Elease Etienne A on: 02/12/2024 04:00 PM   Modules accepted: Orders

## 2024-02-13 ENCOUNTER — Telehealth: Payer: Self-pay | Admitting: Internal Medicine

## 2024-02-13 NOTE — Telephone Encounter (Signed)
 PT is asking Dr. Charlean Sanfilippo nurse to call her. She was here recently and her PB was very low so an ambulance was called. She wonders where she goes from here. [phone]

## 2024-02-13 NOTE — Telephone Encounter (Signed)
 I called and spoke with the [name] [name]  She was here to see MR on 02/09/24 and was sent to ED  The HRCT she had from Dec 2024 was not reviewed due to everything going on and she wants these results now  I have her scheduled for rov with MR 03/04/24 Please advise, thanks!

## 2024-02-16 NOTE — Telephone Encounter (Signed)
 Here are the key findings of the CT scan.  Expressed to the patient the things and read     IMPRESSION: Chronic interstitial lung disease, mildly progressive, favoring the UIP pattern of idiopathic pulmonary fibrosis.  Mildly progressive pulmonary fibrosis   Dilated esophagus with moderate fluid/debris to the level of the lower cervical esophagus. This is new from the prior and raises the possibility of esophageal dysmotility/achalasia. ->  There is a problem in the esophagus and she needs to talk to GI   Scattered right lung nodules, mildly progressive in the posterior right upper lobe, although favored to be inflammatory. Consider follow-up CT chest in 1 year. -Mild change in the size of the nodules for which we will do a CT scan in 1 year   Aortic Atherosclerosis (ICD10-I70.0).     Electronically Signed   By: Charline Bills M.D.   On: 11/24/2023 17:01

## 2024-02-23 ENCOUNTER — Ambulatory Visit: Payer: Self-pay

## 2024-02-23 NOTE — Telephone Encounter (Signed)
  Chief Complaint: vomiting Symptoms: vomiting Frequency: began last night Pertinent Negatives: Patient denies nausea, diarrhea Disposition: [x] ED /[] Urgent Care (no appt availability in office) / [] Appointment(In office/virtual)/ []  [place] [facility]/ [] Home Care/ [x] Refused Recommended Disposition /[] [facility]/ []  Follow-up with PCP Additional Notes: Patient calls stating after eating last night she has had at least 10 episodes of vomiting. She states it is small amounts and she has been able to drink ginger ale recently and keep it down, patient reports she feels like she may have something stuck in her throat but is unsure if it is just sore from vomiting, states she feels her voice is different than normal as well. Per protocol, patient to present to ED for evaluation based on symptoms. Patient states she does not want to go to ED but is agreeable to be seen in Urgent Care on [facility]. States her daughter is present and will be taking her to UC. Care advice reviewed, patient verbalized understanding and denies further questions at this time.  Contacted [facility] regarding ED refusal, spoke with Candace. Alerting PCP for review.    Reason for Disposition  [1] MODERATE vomiting (e.g., 3 - 5 times/day) AND [2] age > 60 years  Answer Assessment - Initial Assessment Questions 1. VOMITING SEVERITY: "How many times have you vomited in the past 24 hours?"     - MILD:  1 - 2 times/day    - MODERATE: 3 - 5 times/day, decreased oral intake without significant weight loss or symptoms of dehydration    - SEVERE: 6 or more times/day, vomits everything or nearly everything, with significant weight loss, symptoms of dehydration      Severe, 10-15 times 2. ONSET: "When did the vomiting begin?"      Last night 3. FLUIDS: "What fluids or food have you vomited up today?" "Have you been able to keep any fluids down?"     States she is drinking ginger ale,  4. ABDOMEN PAIN: "Are your having any  abdomen pain?" If Yes : "How bad is it and what does it feel like?" (e.g., crampy, dull, intermittent, constant)      Denies 5. DIARRHEA: "Is there any diarrhea?" If Yes, ask: "How many times today?"      Denies 6. CONTACTS: "Is there anyone else in the family with the same symptoms?"      Denies 7. CAUSE: "What do you think is causing your vomiting?"     GERD, ate too fast she thinks 8. HYDRATION STATUS: "Any signs of dehydration?" (e.g., dry mouth [not only dry lips], too weak to stand) "When did you last urinate?"     Reports mouth is a little dry, urinated twice today. 9. OTHER SYMPTOMS: "Do you have any other symptoms?" (e.g., fever, headache, vertigo, vomiting blood or coffee grounds, recent head injury)     Headache, sinus congestion  Protocols used: Vomiting-A-AH

## 2024-02-28 DIAGNOSIS — J8410 Pulmonary fibrosis, unspecified: Secondary | ICD-10-CM | POA: Diagnosis not present

## 2024-02-28 DIAGNOSIS — I2510 Atherosclerotic heart disease of native coronary artery without angina pectoris: Secondary | ICD-10-CM | POA: Diagnosis not present

## 2024-03-04 ENCOUNTER — Ambulatory Visit: Admitting: Internal Medicine

## 2024-03-04 ENCOUNTER — Encounter: Payer: Self-pay | Admitting: Internal Medicine

## 2024-03-04 VITALS — BP 104/60 | HR 74 | Ht 64.0 in | Wt 251.0 lb

## 2024-03-04 DIAGNOSIS — R5381 Other malaise: Secondary | ICD-10-CM

## 2024-03-04 DIAGNOSIS — J84112 Idiopathic pulmonary fibrosis: Secondary | ICD-10-CM

## 2024-03-04 DIAGNOSIS — R269 Unspecified abnormalities of gait and mobility: Secondary | ICD-10-CM

## 2024-03-04 DIAGNOSIS — Z87891 Personal history of nicotine dependence: Secondary | ICD-10-CM

## 2024-03-04 DIAGNOSIS — K224 Dyskinesia of esophagus: Secondary | ICD-10-CM | POA: Diagnosis not present

## 2024-03-04 DIAGNOSIS — N189 Chronic kidney disease, unspecified: Secondary | ICD-10-CM

## 2024-03-04 LAB — BASIC METABOLIC PANEL WITH GFR
BUN: 16 mg/dL (ref 6–23)
CO2: 27 meq/L (ref 19–32)
Calcium: 8.9 mg/dL (ref 8.4–10.5)
Chloride: 102 meq/L (ref 96–112)
Creatinine, Ser: 1.03 mg/dL (ref 0.40–1.20)
GFR: 50.33 mL/min — ABNORMAL LOW (ref >60.00)
Glucose, Bld: 102 mg/dL — ABNORMAL HIGH (ref 70–99)
Potassium: 4.2 meq/L (ref 3.5–5.1)
Sodium: 135 meq/L (ref 135–145)

## 2024-03-04 NOTE — Progress Notes (Signed)
 @Patient  ID: [name] [name], female    DOB: [birth year], 83 y.o.   MRN: [id number]  Chief Complaint  Patient presents with   Follow-up    Pt. Says she's had some bleeding.     Referring provider: Sigmund Hazel, MD  HPI: 83 year old female former smoker quit in 1971.  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 Patient of Dr. Marchelle Gearing, last seen by pulmonary NP on 04/10/21.   Previous [facility] pulmonary encounter: 04/10/2021 follow-up; Bronchitis, Pulmonary fibrosis Patient returns for a follow-up visit, patient was seen last week for cough, congestion, shortness of breath and chills.  COVID-19 testing was negative.  Patient had been fully vaccinated for COVID-19.  She had no increased oxygen demands.  Patient was given doxycycline for 7 days and prednisone 20 mg for 5 days. Patient complains ongoing cough and congestion , mucus remains green on/off. No fever.  Eating well , no n/v.d. No hemoptysis , orthopnea or edema.  Called in over the weekend and was called in steroid burst. Currently on Prednisone 40mg  daily .  Chest xray showed chronic changes with no acute process.  Lives at [facility] [place] , drives.   04/17/2021 Patient presents today follow-up with PFTs. She did not have PFTs done today because she is still not feeling well. She completed course of doxycycline and has two days left of prednisone. Reports "cold symptoms" described as sinus drainage and cough. Sputum has not real purulence. Energy level is low. States that she feels her head is not right. She is using Flonase ans Claritin. She is short of breath. She completed doxycycline twice with no improvement. She is taking mucinex twice a day. She has not used albuterol rescue inhaler. Her ex husband died two weeks ago, she found out two days ago. Denies depressed feelings.    05/15/2021 -  Dr. Marchelle Gearing Type of  visit: Telephone/Video [name] [name] 83 y.o. - changed face to face visit to telephone visit due to > 68F heat. Wantd to know PFT resuts June 2022 copared to sept 2021. FVC down 1.6%, DLCO down 2.1% (compared to y  2years ago FVC declined 7.7%). On Esbeit since Dec 2021/Jan 2022.  Says Dr C sarted on her ditliazem 04/18/21 and after that esbriet became more intolerable. She had constipationa and retain fluids.  So, she stopped all her medications except  eliquis and anti depressant and anti histamine. This means she stopped esbriet approx a week ago. Says that for several days still not feel better though better today. She is unsure if today is a good day v bad day. But overall feels A Fib is acting up. She wants to restarrtrt esbriet after good w wash out.   She is now using o2 at night but feels is not helping energy levels at day time. I Advised she could return it and then she said is actually helping her and does not want to return it.    08/14/21- Dr. Marchelle Gearing Chief Complaint  Patient presents with   Follow-up    Pt states she has been doing okay since last visit. Still becomes SOB with exertion.     [name] [name] 83 y.o. -returns for follow-up.  Since her last visit because she was having calls with side effects from pirfenidone.  She stopped it.  She says after that she started feeling better.  Despite this she is reporting  progressive decline in dyspnea.  Dyspnea score is listed below.  She uses oxygen with exertion as needed.  I at night she uses CPAP without oxygen.  She is frustrated with pirfenidone.  She asked about the alternative nintedanib.  She recollects that there constraints with it in the setting of heart disease and anticoagulation.  Explained that one of the study showed 0.5% increase incidence and MI with nintedanib.  But not the other studies.  Explained that surgical mechanism of anticoagulation but clinically patient have tolerated.  Explained about GI side effects  particularly diarrhea.  She is nervous and hesitant but finally we took a shared decision making that she would start it at a lower dose given the fact it can be beneficial in reducing risk of progression.  We also discussed various aspects of clinical trials documented below.  She is interested in this as long as it is a medication with a much better side effect profile.  But at this point in time we took a shared decision making to go with standard of care therapy first.  She will have the flu shot today.  She is very nervous about getting mRNA COVID booster because of the side effect profile.  She had significant side effects apparently.  She also had omicron COVID in January 2022 and she is wondering about natural immunity.  However she is aware is COVID everywhere currently.  We took a shared decision making to check IgG antibody levels and adjust the risk threshold accordingly.  09/18/2021- Interim hx  Patient presents today for acute visit for medication reaction to OFEV. Patient called our office yesterday with reports of rectal bleeding and abdominal pain since starting 09/17/21. She is newly on OFEV since 10/13 and is also on Eliquis. Our office advised her to stop OFEV. Rectal bleeding has since resolved, she still has some slight lower abdomen discomfort described as "bloating".  Breathing wise she is stable. She uses POC 2L with exertion as needed.  She is on oxygen at night with her CPAP. Denies f/c/s, diarrhea, nausea or vomitting.     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  Small pulmonary nodules measuring 5 mm or less stable dating back to 2018 consistent with a benign etiology.  OV 09/26/2021  Subjective:  [name], female , DOB: 05-11-1941 , age 59 y.o. , MRN: [id number] , ADDRESS: [address] [place] [address] PCP Sigmund Hazel, MD Patient Care Team: Sigmund Hazel, MD as  PCP - General (Family Medicine) Thurmon Fair, MD as PCP - Cardiology (Cardiology) Freddy Finner, MD as Consulting Physician (Obstetrics and Gynecology) Sharrell Ku, MD as Consulting Physician (Gastroenterology) Croitoru, Rachelle Hora, MD as Consulting Physician (Cardiology) Ihor Gully, MD (Inactive) as Consulting Physician (Urology) Arminda Resides, MD as Consulting Physician (Dermatology) Kalman Shan, MD as Consulting Physician (Pulmonary Disease) Pollyann Savoy, MD as Consulting Physician (Rheumatology) Antonietta Barcelona, OD as Referring Physician (Optometry) Sanjuana Letters, MD as Referring Physician (Orthopedic Surgery)  This Provider for this visit: Treatment Team:  Attending Provider: Kalman Shan, MD    09/26/2021 -   Chief Complaint  Patient presents with   Follow-up    PFT performed today.  Pt states she has been doing okay since last visit.   Follow-up IPF   - sept 09, 2021 update - IPF dx givien: age greater than 65, only trace positive ANAdescription of small hiatal hernia on CT scan,  the type of pattern on the CT scan called probable UIP, autoimmune antibodies negative last year    - last CT July 2021   -last PFT sept 201  - portable o2 since sept 03-21-20  -esbriet start end sept 21-Mar-2020 -> stopped 11/02/20 due to fatigue -> rstar 11/09/2020 -. Max dose 2 pilll tid since Dec 02, 2020 -> intermittent start/stop summer 2022 -> stopped aug 2022  - Overnight pulse oximetry done on 03/12/2021 shows time spent less than equal to 88% is 40.3 minutes.  Average pulse was 60/min.  Time spent in bradycardia 72.5%. Start 2 L nasal cannula at night  Multiple Lung Nodules 7mm- stable 03-21-2017 -> July 2021 Alpha-1 MZ but does not have emphysema on CT March 21, 2020.  [Daughter died from alpha-1 CC] Obesity  Diastolic dysfunction Atrial fibrillation on Eliquis Obstructive sleep apnea on CPAP without oxygen COVID-19 incidental 12/28/2020 - likely Omicron4  HPI [name] [name] 83 y.o.  -presents ith daughter ? Marijo File meeting first time. Doing stable from symptom stand point resp wise but PFT down significantly. Took ofev for 5 days and there are aspectso of taking ofev (100mg  bid - the lower dose) tht she liked. She felt her urinary stream had better flow (not a known side effect). She had lower appetite (known Se with ofev) but she liked the curb on appetie it due to her obesity. The 5d into she had had mild discomfort in suprapubic area (known SE for abd discmofort) and had 1 drop of painless rectal bleed. So stopped ofev (also on eliquisl bleeding theoertical risk with ofev). After stopping ofev, urinary flow is back to baseline(does not like it), and no mpre rectal bleeding . She never had rectal bleeding before and afer. She again stated and made clear the so called bleeding was "1 drop of blood ont he floor". Associted with ofev intake on background of eliquis and associted with suprapubic discomfor. No dysuria. Dr Orlene Plum is her GI. Last colonoscopy was when she ws 74 and was normal apparently  Discussed  - seeing a GI doc for rectal bleed - she is agreeable   - discussed rechallenge with ofev v joining clinical trial phase 3( to be volunteer + exploit therapeutic advantage with 50% chance of placebo) Discussed other asoects of trials. Explained the 1994-03-21 Zephrysu study with IV MAB against CTGF Pamrevulumab v placeo.  See below. She and dauhger want her to do clinical trial but are more enthused about the idea of a ofev rechallenge first. We discussed that rechallenge liketly to cause side effects but anecdotally seems some patients who do not have issues. She is not interested in esbriet rechallenge       OV 10/10/2021  Subjective:  [name], female , DOB: 1941-09-02 , age 77 y.o. , MRN: [id number] , ADDRESS: [address] [place] [address] PCP Sigmund Hazel, MD Patient Care Team: Sigmund Hazel, MD as PCP - General (Family  Medicine) Thurmon Fair, MD as PCP - Cardiology (Cardiology) Freddy Finner, MD as Consulting Physician (Obstetrics and Gynecology) Sharrell Ku, MD as Consulting Physician (Gastroenterology) Croitoru, Rachelle Hora, MD as Consulting Physician (Cardiology) Ihor Gully, MD (Inactive) as Consulting Physician (Urology) Arminda Resides, MD as Consulting Physician (Dermatology) Kalman Shan, MD as Consulting Physician (Pulmonary Disease) Pollyann Savoy, MD as Consulting Physician (Rheumatology) Antonietta Barcelona, OD as Referring Physician (Optometry) Sanjuana Letters, MD as Referring Physician (Orthopedic Surgery)  This Provider for this visit: Treatment Team:  Attending Provider: Marchelle Gearing,  Carmin Muskrat, MD    10/10/2021 -   Chief Complaint  Patient presents with   Follow-up    Patient states that  she is doing good with the Ofev and is currently not having any side effects. States that she has been coughing a lot lately. Wants to know if she is due for pneumonia vaccine.   Type of visit: Telephone/Video Circumstance: COVID-19 national emergency Identification of patient ELIZE PINON with October 07, 1941 and MRN [id number] - 2 person identifier Risks: Risks, benefits, limitations of telephone visit explained. Patient understood and verbalized agreement to proceed Anyone else on call: just patient Patient location: her home This provider location: [address] [place], [place] [address]   HPI VELTA ROCKHOLT 83 y.o. -at this point telephone visit is to see how rechallenge with nintedanib is ongoing.  Since 10/06/2021 Saturday she is on 100 mg twice daily.  She says she has not had any recurrence of bleeding.  No GI symptoms.  Tolerating it well.  Dyspnea is not any worse.  No new issues.  Noticed mild reduction in GFR in October 2022.  Liver function test and hemoglobin are fine.             OV 11/09/2021  Subjective:  [name], female , DOB: 12-May-1941 , age 30 y.o.  , MRN: [id number] , ADDRESS: [address] [place] [address] PCP Sigmund Hazel, MD Patient Care Team: Sigmund Hazel, MD as PCP - General (Family Medicine) Croitoru, Rachelle Hora, MD as PCP - Cardiology (Cardiology) Freddy Finner, MD as Consulting Physician (Obstetrics and Gynecology) Sharrell Ku, MD as Consulting Physician (Gastroenterology) Croitoru, Rachelle Hora, MD as Consulting Physician (Cardiology) Ihor Gully, MD (Inactive) as Consulting Physician (Urology) Arminda Resides, MD as Consulting Physician (Dermatology) Kalman Shan, MD as Consulting Physician (Pulmonary Disease) Pollyann Savoy, MD as Consulting Physician (Rheumatology) Antonietta Barcelona, OD as Referring Physician (Optometry) Sanjuana Letters, MD as Referring Physician (Orthopedic Surgery)  This Provider for this visit: Treatment Team:  Attending Provider: Kalman Shan, MD    11/09/2021 -   Chief Complaint  Patient presents with   Follow-up    Pt states she is about the same since last visit. Still becomes SOB with exertion.    HPI ARBOR LEER 83 y.o. -returns for follow-up.  She restarted nintedanib on 10/06/2021 at the lower dose.  She tells me that she has not had any rectal bleeding except yesterday there was again a few drops.  No abdominal pain.  Last visit I told her to go see GI doctor but she has not.  Her GI doctor was Dr. Matthias Hughs in [facility] but has since retired.  Advised that that I will make a referral.  In terms of shortness of breath she is stable.  She believes she is tolerating the nintedanib fine.  She is actually gained some weight.  She also reports that for the last 2 weeks she is on metoprolol because of cardiac rhythm issues.  At today blood pressure was 136 systolic.Marland Kitchen  She tells me this is high for her.  She has never had this problem before according to history.  But the observations only today.  She acknowledges that this could be whitecoat hypertension as well.  Last  CT scan of the chest July 2021 Last pulmonary function test October 2022  She is not fully compliant with the portable oxygen.     No results found.     OV 05/31/2022  Subjective:  Patient  ID: [name] [name], female , DOB: Oct 01, 1941 , age 84 y.o. , MRN: [id number] , ADDRESS: [address] [place] [address] PCP Sigmund Hazel, MD Patient Care Team: Sigmund Hazel, MD as PCP - General (Family Medicine) Thurmon Fair, MD as PCP - Cardiology (Cardiology) Freddy Finner, MD as Consulting Physician (Obstetrics and Gynecology) Sharrell Ku, MD as Consulting Physician (Gastroenterology) Croitoru, Rachelle Hora, MD as Consulting Physician (Cardiology) Ihor Gully, MD (Inactive) as Consulting Physician (Urology) Arminda Resides, MD as Consulting Physician (Dermatology) Kalman Shan, MD as Consulting Physician (Pulmonary Disease) Pollyann Savoy, MD as Consulting Physician (Rheumatology) Antonietta Barcelona, OD as Referring Physician (Optometry) Sanjuana Letters, MD as Referring Physician (Orthopedic Surgery)  This Provider for this visit: Treatment Team:  Attending Provider: Kalman Shan, MD   05/31/2022 -   Chief Complaint  Patient presents with   Follow-up    Pt states she is still having problems with her breathing. Also states that she feels like she is more constipated than before and feels like she might have some internal bleeding.     HPI SRUTHI MAURER 83 y.o. -on finger for several months.  She believes IPF is slowly getting worse.  Review of the symptom score shows that symptoms are definitely worse in the last 2 years but compared to 6 months ago around the same.  Shared high-resolution CT chest that shows progression of ILD over 2 years.  I shared the results with her.  This fits in with the symptoms also getting worse over the last 2 years.  She is worried about the progression.  In the past we recommended she consider clinical trial as a care  option but she declined.  This is still under consideration for her but she has not made up her mind.  Last echocardiogram was a few years ago.  Of note she has had rectal bleeding last year.  I referred her to Dr. Carole Binning and GI.  She had virtual colonoscopy.  She asked me to read the report.  It shows she has diverticulosis but does not seem anything hugely abnormal.  She is unsure if she still having rectal bleeding.  She thinks she might be.  She wants to switch GI doctors   She also tells me that since last visit she is on a new medicine for atrial fibrillation.  Tikosyn.  She wanted me to palpate a pulse.  I did this for 30 seconds and told her at least for the 30 seconds she is in sinus rhythm.     OV 09/03/2022  Subjective:  [name], female , DOB: 1941/01/26 , age 2 y.o. , MRN: [id number] , ADDRESS: [address] [place] [address] PCP Sigmund Hazel, MD Patient Care Team: Sigmund Hazel, MD as PCP - General (Family Medicine) Thurmon Fair, MD as PCP - Cardiology (Cardiology) Freddy Finner, MD (Inactive) as Consulting Physician (Obstetrics and Gynecology) Sharrell Ku, MD as Consulting Physician (Gastroenterology) Croitoru, Rachelle Hora, MD as Consulting Physician (Cardiology) Ihor Gully, MD (Inactive) as Consulting Physician (Urology) Arminda Resides, MD as Consulting Physician (Dermatology) Kalman Shan, MD as Consulting Physician (Pulmonary Disease) Pollyann Savoy, MD as Consulting Physician (Rheumatology) Antonietta Barcelona, OD as Referring Physician (Optometry) Sanjuana Letters, MD as Referring Physician (Orthopedic Surgery)  This Provider for this visit: Treatment Team:  Attending Provider: Kalman Shan, MD   09/03/2022 -   Chief Complaint  Patient presents with   Follow-up    Breathing is about  the same. She has some dry cough in the am.      HPI MITTIE KNITTEL 83 y.o. -returns for follow-up.  Today's visit has  been compressed because instead of scheduling on 30 minutes she is on a 15-minute schedule.  For some reason the pulmonary function test is also not done.  In addition scheduling at the front desk was late.  Overall any event she is feeling stable she is tolerating 100 mg twice daily of nintedanib.  She is doing physical therapy and swimming and she is feeling stronger.  She does have some mild diarrhea on and off.  She has not had any more bright blood per rectum.  She is not sure if a GI appointment has gone through.  She says she is still waiting for that appointment.  Last set of labs was in August 2023.  She does have some amount of CKD  Review of the vaccination shows that she will have high-dose flu shot today she is agreed for this.  She is hesitant to get COVID mRNA vaccine.    CT Chest data  No results found.   OV 12/31/2022  Subjective:  [name], female , DOB: [birth year] , age 34 y.o. , MRN: [id number] , ADDRESS: [address] [place] [address] PCP Kristian Covey, MD Patient Care Team: Kristian Covey, MD as PCP - General (Family Medicine) Croitoru, Rachelle Hora, MD as PCP - Cardiology (Cardiology) Freddy Finner, MD (Inactive) as Consulting Physician (Obstetrics and Gynecology) Sharrell Ku, MD as Consulting Physician (Gastroenterology) Croitoru, Rachelle Hora, MD as Consulting Physician (Cardiology) Ihor Gully, MD (Inactive) as Consulting Physician (Urology) Arminda Resides, MD as Consulting Physician (Dermatology) Kalman Shan, MD as Consulting Physician (Pulmonary Disease) Pollyann Savoy, MD as Consulting Physician (Rheumatology) Antonietta Barcelona, OD as Referring Physician (Optometry) Sanjuana Letters, MD as Referring Physician (Orthopedic Surgery)  This Provider for this visit: Treatment Team:  Attending Provider: Kalman Shan, MD   12/31/2022 -   Chief Complaint  Patient presents with   Follow-up    She missed  her appt for PFT today. Breathing is worse over the past 5-6 wks. She is getting her resp virus- took mucinex and her cough has improved. She states that she gets winded walking approx 15 ft. She is exercising 4 x per wk- has lost seven lbs since last visit here 11/15/22. She stopped OFEV approx 2 months ago due to diarrhea.     HPI [name] [name] 83 y.o. -returns for follow-up.  In the interim she stopped nintedanib because of diarrhea.  This was 2 months ago.  She is exercising and has lost some weight see below.  Symptoms are stable.  Walking desaturation test is stable.  She did not show up for a pulmonary function test today.  She tells me that the scheduler confused her when she called to confirm the appointment.  They told her the appointment is 1:00 but they forgot to inform her about the PFT and therefore she forgot about the PFT.  This is according to her.  At this point in time she is content with supportive care without any antifibrotic.  She is not interested in clinical trials.  I discussed this again with her. Unable to do glutathione OPTC due to lack of availabiity      OV 07/22/2023  Subjective:  [name], female , DOB: 01/15/1941 , age 64 y.o. , MRN: [id number] , ADDRESS: [address] [place] [address] PCP Caryl Never, Elberta Fortis, MD Patient Care Team: Kristian Covey, MD as PCP - General (Family Medicine) Thurmon Fair, MD as PCP - Cardiology (Cardiology) Freddy Finner, MD (Inactive) as Consulting Physician (Obstetrics and Gynecology) Sharrell Ku, MD as Consulting Physician (Gastroenterology) Croitoru, Rachelle Hora, MD as Consulting Physician (Cardiology) Ihor Gully, MD (Inactive) as Consulting Physician (Urology) Arminda Resides, MD as Consulting Physician (Dermatology) Kalman Shan, MD as Consulting Physician (Pulmonary Disease) Pollyann Savoy, MD as Consulting Physician (Rheumatology) Antonietta Barcelona, OD as Referring  Physician (Optometry) Sanjuana Letters, MD as Referring Physician (Orthopedic Surgery)  This Provider for this visit: Treatment Team:  Attending Provider: Kalman Shan, MD    07/22/2023 -   Chief Complaint  Patient presents with   Follow-up    F/up      HPI [name] [name] 83 y.o. -returns for IPF follow-up.  She is in supportive care.  Last pulmonary function test was in October 2022 almost 2 years ago.  Last CT scan of the chest in June 2023 just over a year ago.  Last visit was in January 2024.  Overall she says she is stable.  She is not taking any antifibrotic's.  She says her atrial fibrillation is in control/sinus rhythm.  She did see Dr. C8/15/24.  She was in the ED in July 2024 for dizziness and an in the ER and office visit April May 2024 for osteoarthritis of the right hip.  Frequent falls.   OV 10/01/2023  Subjective:  [name], female , DOB: [birth year] , age 23 y.o. , MRN: [id number] , ADDRESS: [address] [place] [address] PCP Kristian Covey, MD Patient Care Team: Kristian Covey, MD as PCP - General (Family Medicine) Croitoru, Rachelle Hora, MD as PCP - Cardiology (Cardiology) Freddy Finner, MD (Inactive) as Consulting Physician (Obstetrics and Gynecology) Sharrell Ku, MD as Consulting Physician (Gastroenterology) Croitoru, Rachelle Hora, MD as Consulting Physician (Cardiology) Ihor Gully, MD (Inactive) as Consulting Physician (Urology) Arminda Resides, MD as Consulting Physician (Dermatology) Kalman Shan, MD as Consulting Physician (Pulmonary Disease) Pollyann Savoy, MD as Consulting Physician (Rheumatology) Antonietta Barcelona, OD as Referring Physician (Optometry) Sanjuana Letters, MD as Referring Physician (Orthopedic Surgery)  This Provider for this visit: Treatment Team:  Attending Provider: Kalman Shan, MD    10/01/2023 -   Chief Complaint  Patient presents with   Follow-up    Breathing has  been gradually getting worse since the last visit. She has cough with clear to green sputum.      HPI TOBI GROESBECK 83 y.o. -last seen in August 2024.  Returns for follow-up.  Since then she is more short of breath she says.  This with exertion relieved by rest.  Her walking desaturation test seemed around the same.  Her weight is around the same.  There are no major new health issues other than falls in the ER visit due to vertigo.  Pulmonary function test shows a decline in FVC by 15% approximately over 2 years.  She is worried about this.  She still does not want to do antifibrotic's.  Of note she also had a worsening cough but when she took a nasal spray a month ago or so it got better but today she is some green mucus she is agreed to take antibiotics.   Regarding her fibrosis symptom score shows worsening. Weight disc treatment options: She does not want to do approved antifibrotic's Discussed  clinical trials as a care option: She might be interested in this.  In the past she has been reluctant.  She is really afraid of the side effects of drugs.  Discussed about VIXARELIMAB monoclonal antibody with acceptable side effect profile currently under investigation versus placebo.  She might be interested in this to get the consent form.  She again is therapeutic misconception about getting placebo.  We went over this and the concept of clinical trial and drug development.  She is going take the consent and read it.  She verbalized understanding.  She is not using her night oxygen.    OV 02/09/2024  Subjective:  [name], female , DOB: 24-Jul-1941 , age 65 y.o. , MRN: [id number] , ADDRESS: [address] [place] [address] PCP Kristian Covey, MD Patient Care Team: Kristian Covey, MD as PCP - General (Family Medicine) Croitoru, Rachelle Hora, MD as PCP - Cardiology (Cardiology) Freddy Finner, MD (Inactive) as Consulting Physician (Obstetrics and  Gynecology) Sharrell Ku, MD as Consulting Physician (Gastroenterology) Croitoru, Rachelle Hora, MD as Consulting Physician (Cardiology) Ihor Gully, MD (Inactive) as Consulting Physician (Urology) Arminda Resides, MD as Consulting Physician (Dermatology) Kalman Shan, MD as Consulting Physician (Pulmonary Disease) Pollyann Savoy, MD as Consulting Physician (Rheumatology) Antonietta Barcelona, OD as Referring Physician (Optometry) Sanjuana Letters, MD as Referring Physician (Orthopedic Surgery)  This Provider for this visit: Treatment Team:  Attending Provider: Kalman Shan, MD    Follow-up IPF   - sept 09, 2021 update - IPF dx givien: age greater than 61, only trace positive ANAdescription of small hiatal hernia on CT scan, the type of pattern on the CT scan called probable UIP, autoimmune antibodies negative last year    - last CT July 2021   -last PFT October 2022  - portable o2 since sept 02-Apr-2020  -esbriet start end sept 02-Apr-2020 -> stopped 11/02/20 due to fatigue -> rstar 11/09/2020 -. Max dose 2 pilll tid since Dec 02, 2020 -> intermittent start/stop summer 04/02/2021 -> stopped aug 2022  - OFEV start Nov 2022  - low dose -> stopped 04/02/2022 due to sid effects   - LAST HRT June 2023   Nocutruanl hyoxeia   - Overnight pulse oximetry done on 03/12/2021 shows time spent less than equal to 88% is 40.3 minutes.  Average pulse was 60/min.  Time spent in bradycardia 72.5%. Start 2 L nasal cannula at night  Multiple Lung Nodules 7mm- stable Apr 02, 2017 -> July 2021 Alpha-1 MZ but does not have emphysema on CT 04-02-2020.  [Daughter died from alpha-1 CC] Obesity  Diastolic dysfunction Atrial fibrillation on Eliquis Obstructive sleep apnea on CPAP without oxygen COVID-19 incidental 12/28/2020 - likely Omicron4   02/09/2024 -   Chief Complaint  Patient presents with   Follow-up    Breathing is about the same. She is recovering from stomach virus.      HPI [name] [name] 83 y.o. -IPF patient not on  antifibrotic's.  Last seen by myself for regular IPF visit in October 2024.  At this visit today is to schedule IPF follow-up.  However in the interim on 01/31/2024 she called the answering service with nausea and vomiting for 10 days and diarrhea for 1-1/2 days.  Advised her to call the primary care physician and she was advised to go to urgent care but it is unclear if she actually went to urgent care.  Then she made an acute appointment with pulmonary clinic 02/04/2024 and were  described as a bronchitis and was given 7 days of doxycycline and 5 days of prednisone.  She decided to keep the scheduled appointment today 02/09/2024.  She comes in and she gives a rambling history of feeling fatigued.  She says the original nausea and vomiting improved but then when she coughs she gets significant vomit.  At 1 point she said she was feeling better but then she said she was not feeling better.  She says she is feeling tired.  She says she did not call EMS because it is expensive.  She believes the shortness of breath is the same but today when she thought she was getting late to the appointment she felt more short of breath and when she felt the cold air outside she felt better.  Here in the office she looks little bit more tired than usual and she was definitely orthostatic.  Supine she was saying she was better but when she stood up she could not stand for anything more than 30 seconds.  She was extremely wobbly and unsteady.  This is quite different from what we have seen her in the past.  This repeated 2 times in the office.  Therefore we called EMS.   SUPINE 122/65 HR 70 SITTING 80/60 STANDING  could NOT stand x 2 due to unsteadiness, dizziness,     OV 03/04/2024  Subjective:  [name], female , DOB: August 03, 1941 , age 61 y.o. , MRN: [id number] , ADDRESS: [address] [place] [address] PCP Kristian Covey, MD Patient Care Team: Kristian Covey, MD as PCP -  General (Family Medicine) Croitoru, Rachelle Hora, MD as PCP - Cardiology (Cardiology) Freddy Finner, MD (Inactive) as Consulting Physician (Obstetrics and Gynecology) Sharrell Ku, MD as Consulting Physician (Gastroenterology) Croitoru, Rachelle Hora, MD as Consulting Physician (Cardiology) Ihor Gully, MD (Inactive) as Consulting Physician (Urology) Arminda Resides, MD as Consulting Physician (Dermatology) Kalman Shan, MD as Consulting Physician (Pulmonary Disease) Pollyann Savoy, MD as Consulting Physician (Rheumatology) Antonietta Barcelona, OD as Referring Physician (Optometry) Sanjuana Letters, MD as Referring Physician (Orthopedic Surgery)  This Provider for this visit: Treatment Team:  Attending Provider: Kalman Shan, MD    03/04/2024 -   Chief Complaint  Patient presents with   Follow-up    Cough with green sputum- started this am- relates to pollen.      HPI EISA CONAWAY 83 y.o. -IPF on supportive care.  Presents for follow-up.  Presents with her daughter.  Last visit we had sent her to the ER because of orthostasis but she stated [facility] long ER for 4 hours and left without being seen.  Did she is now recovered from the fluid is not much dizziness but she has significant gait unsteadiness.  She says she collapses if she stands for too long with a walker.  I have asked her to talk to her primary care physician about this but as courtesy have done a physical therapy home referral.  She continues to have significant acid reflux.  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 Loreta Ave and I made the referral.  This makes her cough at night.  Also makes her belch.  She is also reporting some neck swelling but I palpated did not find anything on the right side.  I also asked the daughter to look at the patient but none of this could find neck swelling.  I have asked her to talk to about this  with the primary care doctor.  She wanted to know her results.  In February 2025 creatinine  was going up I have offered to repeat this chemistry panel but again I recommended that she talk all about this to the primary care doctor.   Did share results of the CT scan with her.  Continue supportive care.     SYMPTOM SCALE - ILD 07/21/2019   08/10/2020 262# 09/26/2020 Last Weight  Most recent update: 09/26/2020  1:42 PM      Weight  120.9 kg (266 lb 9.6 oz)                     11/09/2020 274# 01/23/2021 279# 08/14/2021 275# 09/18/2021 Started OFEV 10/13 but stoped 10/17 d/t GIB  09/26/2021 Off ofev. No more "rectal bleed" 11/09/2021 Back on ofev 100mg  bid since 10/06/21  280# 05/31/2022 276# 12/31/2022 260# 10/01/2023  03/04/2024   O2 use ra ra ra o2 with ex 02 with ex   O2 with exertoin        Shortness of Breath 0 -> 5 scale with 5 being worst (score 6 If unable to do)                   At rest 0 0   5 0 4 0 0 0 0 0 0   Simple tasks - showers, clothes change, eating, shaving 4 3   2 2 2 5  3.5 4 4 3 4  3.5  Household (dishes, doing bed, laundry) 4 3   2 3 2 4 4 4 4  x 4.5 3.5  Shopping 5 3   2 3 2  4.5 4 4.5 4 5  4.5 .5  Walking level at own pace 5 3   2 3 2 4 5  4.5 3 3  4.5 35  Walking up Stairs 5 5   1 5 6 6 6 6 6 5 5 5   Total (40 - 48) Dyspnea Score 23 17   14 16 18  23.5 22.5 23 21 16  22.5   How bad is your cough? 1 1-3 1 1 2 2 1 1  1 2  3.5 At night 3  How bad is your fatigue 4 3 3 4 4 3 3  3.5 3 4 4  0 5  nausea   0 0 0 0 0 0 0 0 0.25 2.5 00   vomit   0 0 0 0 0 0 0 0 0 1.5 0   diarrhea   0 0 0 1 0 0.5 0 00 Once in a while constipaged    axiety   2 1 0 0 0.5 0.5 0 0 0 x 0   depression   2 1.5 2 2 2 1  0.5-1 1 3  3+ 2         Simple office walk 185 feet x  3 laps goal with forehead probe 07/21/2019   08/10/2020   01/23/2021   09/18/2021  05/31/2022  12/31/2022 No show for PFT 10/01/2023    O2 used ra ra ra RA ra ra ra   Number laps completed 3 laps 3 3 2  Only 1 of 3 laps Did  2 of 3 1 of 3   Comments about pace modertae with walker Slow pace Sow pace Slow pace slow      Resting Pulse Ox/HR 97% and 78/min 100% and 74/min 94%and HR 92 96% RA and HR 80 98% and HR 64 98%  95%   Final Pulse Ox/HR 93% and 109/min 77%  and 85/min 90 % and HR 100 90% and HR 125 97% and HR 105 95% 91%   Desaturated </= 88% no no no No no     Desaturated <= 3% points Yes, 4 Yes, 13 points Yes, 4 piints Yes, 6 points no     Got Tachycardic >/= 90/min you have pulmonary fibrosis otherwise call interstitial lung disease yes no yes Yes yes     Symptoms at end of test dyspnea Mild dyspnea Dyspnea throughtou Dyspnea  Mod dspnea     Miscellaneous comments x Corrected with 2LNC and got    X  Very deconditioned      IMPRESSION: Chronic interstitial lung disease, mildly progressive, favoring the UIP pattern of idiopathic pulmonary fibrosis.   Dilated esophagus with moderate fluid/debris to the level of the lower cervical esophagus. This is new from the prior and raises the possibility of esophageal dysmotility/achalasia.   Scattered right lung nodules, mildly progressive in the posterior right upper lobe, although favored to be inflammatory. Consider follow-up CT chest in 1 year.   Aortic Atherosclerosis (ICD10-I70.0).     Electronically Signed   By: Charline Bills M.D.   On: 11/24/2023 17:0  PFT     Latest Ref Rng & Units 09/04/2023    4:05 PM 09/26/2021    2:58 PM 05/07/2021    3:12 PM 08/10/2020   10:03 AM 07/19/2019    3:20 PM 02/24/2017   11:58 AM  PFT Results  FVC-Pre L 2.14  2.38  2.53  2.57  2.74  2.58   FVC-Predicted Pre % 73  79  84  84  93  85   FVC-Post L     2.63  2.45   FVC-Predicted Post %     89  81   Pre FEV1/FVC % % 84  87  86  86  86  82   Post FEV1/FCV % %     90  87   FEV1-Pre L 1.80  2.06  2.17  2.22  2.36  2.12   FEV1-Predicted Pre % 83  92  97  97  107  93   FEV1-Post L     2.35  2.14   DLCO uncorrected ml/min/mmHg 12.22  9.76  15.50  15.83  16.52  16.15   DLCO UNC% % 59  47  74  76  81  59   DLCO corrected ml/min/mmHg 12.22  9.48  15.50  15.34    14.93   DLCO COR %Predicted % 59  45  74  73   55   DLVA Predicted % 98  77  105  97  104  79   TLC L    4.50  3.97    TLC % Predicted %    81  74    RV % Predicted %    71  52         LAB RESULTS last 96 hours No results found.       has a past medical history of Alpha-1-antitrypsin deficiency carrier, Arthritis, Atherosclerosis of aorta (HCC), Atrial fibrillation (HCC), Back pain, Complication of anesthesia, Depression, Diastolic dysfunction, Eating disorder, Frequent UTI, GERD (gastroesophageal reflux disease), Heart disease, Hypertension, Hypertriglyceridemia, Insulin resistance, Long term current use of anticoagulant, Multiple lung nodules, Muscular deconditioning, MVP (mitral valve prolapse), Obesity, OSA on CPAP, Osteoarthritis, Osteopenia, Pacemaker, PAF (paroxysmal atrial fibrillation) (HCC), Positive ANA (antinuclear antibody), Presbyesophagus, Presence of permanent cardiac pacemaker, Primary osteoarthritis of both hands, Pulmonary fibrosis (HCC), PVC's (premature  ventricular contractions), Shortness of breath, Sinus arrest (10/2014), Sleep apnea, Stroke (HCC) (01/2014), Tachy-brady syndrome (HCC) (10/2014), TIA (transient ischemic attack), and Vitamin D deficiency.   reports that she quit smoking about 53 years ago. Her smoking use included cigarettes. She started smoking about 71 years ago. She has a 36 pack-year smoking history. She has never used smokeless tobacco.  Past Surgical History:  Procedure Laterality Date   CARDIOVERSION N/A 09/02/2017   Procedure: CARDIOVERSION;  Surgeon: Thurmon Fair, MD;  Location: [facility];  Service: Cardiovascular;  Laterality: N/A;   COLONOSCOPY  2019   ESOPHAGEAL DILATION     EXCISION VAGINAL CYST     benign nodules   INSERT / REPLACE / REMOVE PACEMAKER  10/04/2014   [employer] Advisa model A2DR01 serial number [id number]   LOOP RECORDER EXPLANT N/A 10/04/2014   Procedure: LOOP RECORDER EXPLANT;  Surgeon: Thurmon Fair, MD;   Location: [facility] C[facility] LAB;  Service: Cardiovascular;  Laterality: N/A;   LOOP RECORDER IMPLANT N/A 04/19/2014   Procedure: LOOP RECORDER IMPLANT;  Surgeon: Thurmon Fair, MD;  Location: [facility];  Service: Cardiovascular;  Laterality: N/A;   NM MYOCAR PERF WALL MOTION  12/18/2007   normal   PERMANENT PACEMAKER INSERTION N/A 10/04/2014   Procedure: PERMANENT PACEMAKER INSERTION;  Surgeon: Thurmon Fair, MD;  Location: [facility] C[facility];  Service: Cardiovascular;  Laterality: N/A;   TUBAL LIGATION     US ECHOCARDIOGRAPHY  05/15/2010   LA mildly dilated,mild mitral annular ca+, AOV mildly sclerotic, mild asymmetric LVH    Allergies  Allergen Reactions   Cefazolin Hives and Itching    Other reaction(s): severe hives   Pseudoephedrine Hcl     Other reaction(s): palpitations (moderate)   Ergotamine     unknown   Other Other (See Comments)   Pentobarbital     unknown   Pirfenidone     unknown   Caffeine Palpitations    Immunization History  Administered Date(s) Administered   Fluad Quad(high Dose 65+) 08/14/2021, 09/03/2022   Fluad Trivalent(High Dose 65+) 10/01/2023   Influenza Split 10/09/2009, 09/19/2010, 09/13/2020   Influenza Whole 09/23/2008   Influenza, High Dose Seasonal PF 09/13/2018, 09/27/2019, 09/26/2020   Influenza,inj,quad, With Preservative 09/04/2017, 09/05/2021   Influenza-Unspecified 09/17/2014, 09/09/2016, 09/04/2017   Moderna Sars-Covid-2 Vaccination 12/06/2019, 01/03/2020   PNEUMOCOCCAL CONJUGATE-20 10/15/2023   Pneumococcal Conjugate-13 09/29/2014, 10/16/2015   Pneumococcal Polysaccharide-23 04/06/2009, 10/02/2017   Pneumococcal-Unspecified 10/01/2014, 10/02/2017   Td 04/05/1999   Tdap 04/06/2009   Zoster Recombinant(Shingrix) 09/29/2018, 01/19/2019   Zoster, Live 12/02/2008, 09/29/2018, 01/19/2019    Family History  Problem Relation Age of Onset   Heart attack Mother    Sudden death Mother    Stroke Father    Heart failure Father    Alcoholism Brother     Healthy Sister    Stroke Brother    Alpha-1 antitrypsin deficiency Daughter    Breast cancer Daughter    Prostate cancer Brother    Stroke Brother    Prostate cancer Brother    Healthy Brother    Prostate cancer Brother    Healthy Daughter    Diabetes Son    Arthritis Son      Current Outpatient Medications:    Cholecalciferol (VITAMIN D3 PO), Take 1 tablet by mouth daily., Disp: , Rfl:    diazepam (VALIUM) 5 MG tablet, TAKE 1 TABLET ONCE A DAY AS NEEDED., Disp: 30 tablet, Rfl: 0   dofetilide (TIKOSYN) 500 MCG capsule, Take 1 capsule (500 mcg total) by mouth  2 (two) times daily., Disp: 180 capsule, Rfl: 1   ELIQUIS 5 MG TABS tablet, TAKE ONE TABLET BY MOUTH TWICE DAILY, Disp: 180 tablet, Rfl: 1   fluticasone (FLONASE) 50 MCG/ACT nasal spray, Place 1 spray into both nostrils daily., Disp: 16 g, Rfl: 2   ipratropium (ATROVENT) 0.06 % nasal spray, Place 2 sprays into both nostrils 4 (four) times daily as needed for rhinitis., Disp: 15 mL, Rfl: 12   Magnesium 400 MG CAPS, Take 400 mg by mouth daily., Disp: 30 capsule, Rfl: 0   metoprolol tartrate (LOPRESSOR) 25 MG tablet, Take 1/2 tablets (12.5 mg total) by mouth 2 (two) times daily., Disp: 90 tablet, Rfl: 2   nitroGLYCERIN (NITROSTAT) 0.4 MG SL tablet, Place 1 tablet (0.4 mg total) under the tongue every 5 (five) minutes as needed for chest pain., Disp: 25 tablet, Rfl: 1   nystatin cream (MYCOSTATIN), Apply to affected rash twice a day as needed, Disp: 30 g, Rfl: 0   pantoprazole (PROTONIX) 40 MG tablet, Take 1 tablet (40 mg total) by mouth 2 (two) times daily., Disp: 180 tablet, Rfl: 3   sertraline (ZOLOFT) 100 MG tablet, TAKE ONE TABLET BY MOUTH ONCE DAILY., Disp: 90 tablet, Rfl: 3      Objective:   Vitals:   03/04/24 1338  BP: 104/60  Pulse: 74  SpO2: 95%  Weight: 251 lb (113.9 kg)  Height: 5\' 4"  (1.626 m)    Estimated body mass index is 43.08 kg/m as calculated from the following:   Height as of this encounter: 5\' 4"   (1.626 m).   Weight as of this encounter: 251 lb (113.9 kg).  @WEIGHTCHANGE @  Filed Weights   03/04/24 1338  Weight: 251 lb (113.9 kg)     Physical Exam   General: No distress. Obese. HAS WALKER O2 at rest: no Cane present: no Sitting in wheel chair: no Frail: no Obese: no Neuro: Alert and Oriented x 3. GCS 15. Speech normal Psych: Pleasant Resp:  Barrel Chest - no.  Wheeze - no, Crackles - no, No overt respiratory distress CVS: Normal heart sounds. Murmurs - no Ext: Stigmata of Connective Tissue Disease - no HEENT: Normal upper airway. PEERL +. No post nasal drip        Assessment:       ICD-10-CM   1. IPF (idiopathic pulmonary fibrosis) (HCC)  J84.112 Ambulatory referral to Gastroenterology    Basic Metabolic Panel (BMET)    Ambulatory referral to Physical Therapy    2. Esophageal dysmotilities  K22.4 Ambulatory referral to Gastroenterology    Basic Metabolic Panel (BMET)    Ambulatory referral to Physical Therapy    3. Physical deconditioning  R53.81 Ambulatory referral to Gastroenterology    Basic Metabolic Panel (BMET)    Ambulatory referral to Physical Therapy    4. Gait abnormality  R26.9 Ambulatory referral to Gastroenterology    Basic Metabolic Panel (BMET)    Ambulatory referral to Physical Therapy    5. Chronic kidney disease, unspecified CKD stage  N18.9 Ambulatory referral to Gastroenterology    Basic Metabolic Panel (BMET)    Ambulatory referral to Physical Therapy         Plan:     Patient Instructions  IPF (idiopathic pulmonary fibrosis) (HCC)   - Fibrosis is slowly and mildly progressive over a year or 2 but not at a point where you are requiring oxygen - Only supportive care/expectant follow-up because of lack of interest in antifibrotic's   PLAN -Continue monitoring approach  Other issues - Chronic kidney disease - Physical deconditioning with gait imbalance - Neck swelling that I cannot visualize -Dilated  esophagus  Plan - Check chemistry panel today - Referral home physical therapy - Refer Dr. [facility] (your preference to see her over [facility]] -Other issues by primary care physician  Followup -6 months or sooner if needed.   FOLLOWUP Return in about 6 months (around 09/03/2024) for 15 min visit, Face to Face Visit.    SIGNATURE    Dr. Kalman Shan, M.D., F.C.C.P,  Pulmonary and Critical Care Medicine Staff Physician, [facility] Director - Interstitial Lung Disease  Program  Pulmonary Fibrosis [facility] [place], [place], [address]  Pager: [phone], If no answer or between  15:00h - 7:00h: call [phone] Telephone: [phone]  2:21 PM 03/04/2024

## 2024-03-04 NOTE — Patient Instructions (Addendum)
 IPF (idiopathic pulmonary fibrosis) (HCC)   - Fibrosis is slowly and mildly progressive over a year or 2 but not at a point where you are requiring oxygen - Only supportive care/expectant follow-up because of lack of interest in antifibrotic's   PLAN -Continue monitoring approach   Other issues - Chronic kidney disease - Physical deconditioning with gait imbalance - Neck swelling that I cannot visualize -Dilated esophagus  Plan - Check chemistry panel today - Referral home physical therapy - Refer Dr. [facility] (your preference to see her over  GI] -Other issues by primary care physician  Followup -6 months or sooner if needed.

## 2024-03-05 ENCOUNTER — Encounter: Payer: Self-pay | Admitting: Internal Medicine

## 2024-03-05 NOTE — Progress Notes (Signed)
Kidney function back to normal.

## 2024-03-08 ENCOUNTER — Telehealth: Payer: Self-pay | Admitting: *Deleted

## 2024-03-08 ENCOUNTER — Ambulatory Visit: Admitting: Family Medicine

## 2024-03-08 NOTE — Telephone Encounter (Signed)
 Copied from CRM [id number]. Topic: Referral - Status >> Mar 05, 2024  1:34 PM Nila Nephew wrote: Reason for CRM: Patient states want to leave a message for [place].  Patient needs to be referred to Physical Therapy Office(thinks is named [facility]) where she lives (at friends home), as they have PT on-site at the location she lives. Patient does not wish to have referral sent to [facility].

## 2024-03-09 ENCOUNTER — Encounter: Payer: Self-pay | Admitting: Family Medicine

## 2024-03-09 ENCOUNTER — Ambulatory Visit (INDEPENDENT_AMBULATORY_CARE_PROVIDER_SITE_OTHER): Admitting: Family Medicine

## 2024-03-09 VITALS — BP 128/76 | HR 85 | Temp 97.9°F | Ht 64.0 in | Wt 247.6 lb

## 2024-03-09 DIAGNOSIS — R29898 Other symptoms and signs involving the musculoskeletal system: Secondary | ICD-10-CM

## 2024-03-09 DIAGNOSIS — M6281 Muscle weakness (generalized): Secondary | ICD-10-CM

## 2024-03-09 DIAGNOSIS — R1310 Dysphagia, unspecified: Secondary | ICD-10-CM | POA: Diagnosis not present

## 2024-03-09 NOTE — Progress Notes (Signed)
 Established Patient Office Visit  Subjective   [name], female    DOB: December 11, 1940  Age: 83 y.o. MRN: [id number]  No chief complaint on file.   HPI   [name] [name] has history of atrial fibrillation, TIA, chronic respiratory failure with hypoxia, obstructive sleep apnea, pulmonary fibrosis, GERD  She had some questions regarding CT scan from back in December which was ordered for her pulmonary.  It was comment of dilated esophagus with moderate fluid/debris to the level of the lower cervical esophagus.  She has history of known esophageal dysmotility.  She had seen previous GI specialist with [facility] and they refused to do endoscopy procedure because of her chronic lung issues and also history of heart failure.  She has pending second opinion with another gastroenterologist in [place] for later.  She has to be very careful what she eats.  Does not try hard to chew things like meats.  Has to eat very slowly.  Has lost some modest weight since last visit.  She is happy with that overall.  She still has decent appetite.  Other issues she states she has had some bilateral lower extremity weakness.  She can be walking along doing fine and then her legs feel "wobbly "and have given out a couple times.  She does ambulate with a walker most of the time.  Denies any urine or stool incontinence.  No recent low back pain.  No neuropathy symptoms in terms of numbness or consistent weakness.  Denies any myalgias.  No statin use.  No chronic steroid use.  Past Medical History:  Diagnosis Date   Alpha-1-antitrypsin deficiency carrier    Arthritis    "all over"   Atherosclerosis of aorta (HCC)    Atrial fibrillation (HCC)    Back pain    Complication of anesthesia    "I woke up during 2 different procedures" (10/04/2014)   Depression    Diastolic dysfunction    Eating disorder    Frequent UTI    GERD (gastroesophageal reflux disease)    Heart disease    Hypertension     Hypertriglyceridemia    Insulin resistance    Long term current use of anticoagulant    Multiple lung nodules    Muscular deconditioning    MVP (mitral valve prolapse)    Obesity    OSA on CPAP    Osteoarthritis    Osteopenia    unspecified location   Pacemaker    PAF (paroxysmal atrial fibrillation) (HCC)    Positive ANA (antinuclear antibody)    Presbyesophagus    Presence of permanent cardiac pacemaker    Primary osteoarthritis of both hands    neck and spine and hips   Pulmonary fibrosis (HCC)    followed by pulmonolgy   PVC's (premature ventricular contractions)    Shortness of breath    Sinus arrest 10/2014   s/p Medtronic Advisa model A2DR01 serial number [id number]   Sleep apnea    Stroke (HCC) 01/2014   denies deficits on 10/04/2014   Tachy-brady syndrome (HCC) 10/2014   TIA (transient ischemic attack)    Vitamin D deficiency    Past Surgical History:  Procedure Laterality Date   CARDIOVERSION N/A 09/02/2017   Procedure: CARDIOVERSION;  Surgeon: Thurmon Fair, MD;  Location: [facility];  Service: Cardiovascular;  Laterality: N/A;   COLONOSCOPY  2019   ESOPHAGEAL DILATION     EXCISION VAGINAL CYST     benign nodules   INSERT / REPLACE /  REMOVE PACEMAKER  10/04/2014   Medtronic Advisa model [id number] serial number [id number]   LOOP RECORDER EXPLANT N/A 10/04/2014   Procedure: LOOP RECORDER EXPLANT;  Surgeon: Thurmon Fair, MD;  Location: [facility];  Service: Cardiovascular;  Laterality: N/A;   LOOP RECORDER IMPLANT N/A 04/19/2014   Procedure: LOOP RECORDER IMPLANT;  Surgeon: Thurmon Fair, MD;  Location: [facility];  Service: Cardiovascular;  Laterality: N/A;   NM MYOCAR PERF WALL MOTION  12/18/2007   normal   PERMANENT PACEMAKER INSERTION N/A 10/04/2014   Procedure: PERMANENT PACEMAKER INSERTION;  Surgeon: Thurmon Fair, MD;  Location: [facility];  Service: Cardiovascular;  Laterality: N/A;   TUBAL LIGATION     US ECHOCARDIOGRAPHY  05/15/2010   LA mildly  dilated,mild mitral annular ca+, AOV mildly sclerotic, mild asymmetric LVH    reports that she quit smoking about 53 years ago. Her smoking use included cigarettes. She started smoking about 71 years ago. She has a 36 pack-year smoking history. She has never used smokeless tobacco. She reports current alcohol use. She reports that she does not use drugs. family history includes Alcoholism in her brother; Alpha-1 antitrypsin deficiency in her daughter; Arthritis in her son; Breast cancer in her daughter; Diabetes in her son; Healthy in her brother, daughter, and sister; Heart attack in her mother; Heart failure in her father; Prostate cancer in her brother, brother, and brother; Stroke in her brother, brother, and father; Sudden death in her mother. Allergies  Allergen Reactions   Cefazolin Hives and Itching    Other reaction(s): severe hives   Pseudoephedrine Hcl     Other reaction(s): palpitations (moderate)   Ergotamine     unknown   Other Other (See Comments)   Pentobarbital     unknown   Pirfenidone     unknown   Caffeine Palpitations    Review of Systems  Constitutional:  Positive for weight loss. Negative for malaise/fatigue.  Eyes:  Negative for blurred vision.  Respiratory:  Negative for shortness of breath.   Cardiovascular:  Negative for chest pain.  Gastrointestinal:  Positive for heartburn. Negative for abdominal pain, nausea and vomiting.  Neurological:  Negative for dizziness, weakness and headaches.      Objective:     BP 128/76 (BP Location: Left Arm, Patient Position: Sitting, Cuff Size: Normal)   Pulse 85   Temp 97.9 F (36.6 C) (Oral)   Ht 5\' 4"  (1.626 m)   Wt 247 lb 9.6 oz (112.3 kg)   LMP  (LMP Unknown)   SpO2 95%   BMI 42.50 kg/m  BP Readings from Last 3 Encounters:  03/09/24 128/76  03/04/24 104/60  02/09/24 (!) 160/90   Wt Readings from Last 3 Encounters:  03/09/24 247 lb 9.6 oz (112.3 kg)  03/04/24 251 lb (113.9 kg)  02/09/24 251 lb (113.9  kg)      Physical Exam Vitals reviewed.  Constitutional:      General: She is not in acute distress.    Appearance: She is not ill-appearing.  Cardiovascular:     Rate and Rhythm: Normal rate and regular rhythm.  Pulmonary:     Effort: Pulmonary effort is normal.     Breath sounds: Normal breath sounds.  Musculoskeletal:     Right lower leg: No edema.     Left lower leg: No edema.  Neurological:     Mental Status: She is alert.     Comments: Has 2+ reflexes knee and ankle bilaterally.  She has excellent strength  with plantarflexion, dorsiflexion, knee extension, and hip flexion against resistance.      No results found for any visits on 03/09/24.    The ASCVD Risk score (Arnett DK, et al., 2019) failed to calculate for the following reasons:   The 2019 ASCVD risk score is only valid for ages 75 to 3   Risk score cannot be calculated because patient has a medical history suggesting prior/existing ASCVD    Assessment & Plan:   #1 patient has had somewhat of a chronic problem of intermittent solid food dysphagia.  Previous gastroenterologist with [facility] declined further EGD with her lung and heart history.  She has second opinion pending with local gastroenterologist in [place].  She knows importance of avoiding hard to chew foods and eating slowly  She does have chronic GERD and is on chronic PPI therapy.  #2 intermittent muscle fatigue and weakness lower extremities.  No neurogenic claudication symptoms.  No areflexia.  No weakness noted on exam today.  Suspect she has significant deconditioning.  No statin use.  No chronic steroid use.  She has had some physical therapy in the past.  We suggested the following  -Check TSH, 25-hydroxy vitamin D, and creatinine kinase - Did suggest possible repeat trial of PT for muscle strengthening - Consider neurology evaluation for any progressive weakness concerns   No follow-ups on file.    Evelena Peat, MD

## 2024-03-10 LAB — VITAMIN D 25 HYDROXY (VIT D DEFICIENCY, FRACTURES): VITD: 95.97 ng/mL (ref 30.00–100.00)

## 2024-03-10 LAB — CK: Total CK: 53 U/L (ref 7–177)

## 2024-03-10 LAB — TSH: TSH: 3.14 u[IU]/mL (ref 0.35–5.50)

## 2024-03-10 NOTE — Telephone Encounter (Signed)
 I have called [facility] and they informed me to contact the facility to get any info they need for a referral to there. Left VM with them should be returning my call soon in regards to what is needed and where it needs to be sent.

## 2024-03-11 ENCOUNTER — Ambulatory Visit: Payer: Medicare Other | Admitting: Family Medicine

## 2024-03-11 DIAGNOSIS — Z0000 Encounter for general adult medical examination without abnormal findings: Secondary | ICD-10-CM

## 2024-03-11 NOTE — Progress Notes (Signed)
 PATIENT CHECK-IN and HEALTH RISK ASSESSMENT QUESTIONNAIRE:  -completed by phone/video for upcoming Medicare Preventive Visit  Pre-Visit Check-in: 1)Vitals (height, wt, BP, etc) - record in vitals section for visit on day of visit Request home vitals (wt, BP, etc.) and enter into vitals, THEN update Vital Signs SmartPhrase below at the top of the HPI. See below.  2)Review and Update Medications, Allergies PMH, Surgeries, Social history in Epic 3)Hospitalizations in the last year with date/reason? n 4)Review and Update Care Team (patient's specialists) in Epic 5) Complete PHQ9 in Epic  6) Complete Fall Screening in Epic 7)Review all Health Maintenance Due and order under PCP if not done.  Medicare Wellness Patient Questionnaire:  Answer theses question about your habits: How often do you have a drink containing alcohol?n Have you ever smoked? Over 50 years ago How many packs a day do/did you smoke?   On average, how many days per week do you engage in moderate to strenuous exercise (like a brisk walk)? Has been working with a PT 3x per week, gym 4x per week Typical diet: she is trying to eat well, she is currently undergoing eval for swallowing issues   Answer theses question about your everyday activities: Can you perform most household chores?y Are you deaf or have significant trouble hearing?n Do you feel that you have a problem with memory?feels is ok Do you feel safe at home?y Last dentist visit?goes twice a year 8. Do you have any difficulty performing your everyday activities?n Are you having any difficulty walking, taking medications on your own, and or difficulty managing daily home needs?n Do you have difficulty walking or climbing stairs?some, has stair issues Do you have difficulty dressing or bathing?n Do you have difficulty doing errands alone such as visiting a doctor's office or shopping?n Do you currently have any difficulty preparing food and eating?n Do you  currently have any difficulty using the toilet?n Do you have any difficulty managing your finances?n Do you have any difficulties with housekeeping of managing your housekeeping?n   Do you have Advanced Directives in place (Living Will, Healthcare Power or Attorney)? y   Last eye Exam and location? Had cataract surgery last year, hasn't been to the eye doc recently - plans to follow up soon.   Do you currently use prescribed or non-prescribed narcotic or opioid pain medications?n  Do you have a history or close family history of breast, ovarian, tubal or peritoneal cancer or a family member with BRCA (breast cancer susceptibility 1 and 2) gene mutations?    ----------------------------------------------------------------------------------------------------------------------------------------------------------------------------------------------------------------------  Because this visit was a virtual/telehealth visit, some criteria may be missing or patient reported. Any vitals not documented were not able to be obtained and vitals that have been documented are patient reported.    MEDICARE ANNUAL PREVENTIVE VISIT WITH PROVIDER: (Welcome to Medicare, initial annual wellness or annual wellness exam)  Virtual Visit via Phone Note  I connected with [name] [name] on 03/11/24 by phone and verified that I am speaking with the correct person using two identifiers.  Location patient: home Location provider:work or home office Persons participating in the virtual visit: patient, provider  Concerns and/or follow up today: no changes since recent visit. Staying in Independent living facility. Has had some falls but now is using walker at all times and is being more careful.     See HM section in Epic for other details of completed HM.    ROS: negative for report of fevers, vision changes, vision loss, hearing loss  or change, chest pain, change in sob, hemoptysis, melena, hematochezia,  hematuria, falls, bleeding or bruising, thoughts of suicide or self harm, memory loss  Patient-completed extensive health risk assessment - reviewed and discussed with the patient: See Health Risk Assessment completed with patient prior to the visit either above or in recent phone note. This was reviewed in detailed with the patient today and appropriate recommendations, orders and referrals were placed as needed per Summary below and patient instructions.   Review of Medical History: -PMH, PSH, Family History and current specialty and care providers reviewed and updated and listed below   Patient Care Team: Kristian Covey, MD as PCP - General (Family Medicine) Croitoru, Rachelle Hora, MD as PCP - Cardiology (Cardiology) Freddy Finner, MD (Inactive) as Consulting Physician (Obstetrics and Gynecology) Sharrell Ku, MD as Consulting Physician (Gastroenterology) Croitoru, Rachelle Hora, MD as Consulting Physician (Cardiology) Ihor Gully, MD (Inactive) as Consulting Physician (Urology) Arminda Resides, MD as Consulting Physician (Dermatology) Kalman Shan, MD as Consulting Physician (Pulmonary Disease) Pollyann Savoy, MD as Consulting Physician (Rheumatology) Antonietta Barcelona, OD as Referring Physician (Optometry) Sanjuana Letters, MD as Referring Physician (Orthopedic Surgery)   Past Medical History:  Diagnosis Date   Alpha-1-antitrypsin deficiency carrier    Arthritis    "all over"   Atherosclerosis of aorta [facility])    Atrial fibrillation (HCC)    Back pain    Complication of anesthesia    "I woke up during 2 different procedures" (10/04/2014)   Depression    Diastolic dysfunction    Eating disorder    Frequent UTI    GERD (gastroesophageal reflux disease)    Heart disease    Hypertension    Hypertriglyceridemia    Insulin resistance    Long term current use of anticoagulant    Multiple lung nodules    Muscular deconditioning    MVP (mitral valve prolapse)    Obesity     OSA on CPAP    Osteoarthritis    Osteopenia    unspecified location   Pacemaker    PAF (paroxysmal atrial fibrillation) (HCC)    Positive ANA (antinuclear antibody)    Presbyesophagus    Presence of permanent cardiac pacemaker    Primary osteoarthritis of both hands    neck and spine and hips   Pulmonary fibrosis (HCC)    followed by pulmonolgy   PVC's (premature ventricular contractions)    Shortness of breath    Sinus arrest 10/2014   s/p Medtronic Advisa model A2DR01 serial number [id number]   Sleep apnea    Stroke (HCC) 01/2014   denies deficits on 10/04/2014   Tachy-brady syndrome (HCC) 10/2014   TIA (transient ischemic attack)    Vitamin D deficiency     Past Surgical History:  Procedure Laterality Date   CARDIOVERSION N/A 09/02/2017   Procedure: CARDIOVERSION;  Surgeon: Thurmon Fair, MD;  Location: [facility];  Service: Cardiovascular;  Laterality: N/A;   COLONOSCOPY  2019   ESOPHAGEAL DILATION     EXCISION VAGINAL CYST     benign nodules   INSERT / REPLACE / REMOVE PACEMAKER  10/04/2014   Medtronic Advisa model A2DR01 serial number [id number] H   LOOP RECORDER EXPLANT N/A 10/04/2014   Procedure: LOOP RECORDER EXPLANT;  Surgeon: Thurmon Fair, MD;  Location: [facility] CATH LAB;  Service: Cardiovascular;  Laterality: N/A;   LOOP RECORDER IMPLANT N/A 04/19/2014   Procedure: LOOP RECORDER IMPLANT;  Surgeon: Thurmon Fair, MD;  Location: [facility] CATH [facility];  Service: Cardiovascular;  Laterality: N/A;  NM MYOCAR PERF WALL MOTION  12/18/2007   normal   PERMANENT PACEMAKER INSERTION N/A 10/04/2014   Procedure: PERMANENT PACEMAKER INSERTION;  Surgeon: Thurmon Fair, MD;  Location: [facility] C[facility];  Service: Cardiovascular;  Laterality: N/A;   TUBAL LIGATION     US ECHOCARDIOGRAPHY  05/15/2010   LA mildly dilated,mild mitral annular ca+, AOV mildly sclerotic, mild asymmetric LVH    Social History   Socioeconomic History   Marital status: Divorced    Spouse name: Not on file   Number  of children: 3   Years of education: college   Highest education level: Not on file  Occupational History   Occupation: Retired Scientist, research (physical sciences)   Occupation: retired  Tobacco Use   Smoking status: Former    Current packs/day: 0.00    Average packs/day: 2.0 packs/day for 18.0 years (36.0 ttl pk-yrs)    Types: Cigarettes    Start date: 07/18/1952    Quit date: 07/18/1970    Years since quitting: 53.6   Smokeless tobacco: Never   Tobacco comments:    Former smoker 07/18/22 quit smoking in 1971  Vaping Use   Vaping status: Never Used  Substance and Sexual Activity   Alcohol use: Yes    Alcohol/week: 0.0 standard drinks of alcohol    Comment: 10/04/2014 "might have a drink a couple times/yr"   Drug use: No   Sexual activity: Never  Other Topics Concern   Not on file  Social History Narrative   Lives at [facility]    Patient is right handed.   Patient has a college degree.   Patient is retired.      Social History      Diet? Awful sweets are a terrible addiction       Do you drink/eat things with caffeine? some      Marital status?          divorced                          What year were you married? 1960      Do you live in a house, apartment, assisted living, condo, trailer, etc.? [facility]       Is it one or more stories? 1st level      How many persons live in your home? 1      Do you have any pets in your home? (please list) no       Highest level of education completed? College graduate      Current or past profession: realtor       Do you exercise?             Not much                          Type & how often? Barely any  - bed exercise for injured knee      Advanced Directives      Do you have a living will? yes      Do you have a DNR form?                                  If not, do you want to discuss one?yes      Do you have signed POA/HPOA for forms? yes      Functional Status  Do you have difficulty bathing or dressing yourself?  No       Do you have difficulty preparing food or eating? No       Do you have difficulty managing your medications? No       Do you have difficulty managing your finances? No       Do you have difficulty affording your medications? No  Except eloquist after donut hole - so far I have been able to get samples from cardiologist      Social Drivers of Health   Financial Resource Strain: Low Risk  (01/22/2023)   Overall Financial Resource Strain (CARDIA)    Difficulty of Paying Living Expenses: Not hard at all  Food Insecurity: No Food Insecurity (01/22/2023)   Hunger Vital Sign    Worried About Running Out of Food in the Last Year: Never true    Ran Out of Food in the Last Year: Never true  Transportation Needs: No Transportation Needs (01/22/2023)   PRAPARE - Administrator, Civil Service (Medical): No    Lack of Transportation (Non-Medical): No  Physical Activity: Sufficiently Active (01/22/2023)   Exercise Vital Sign    Days of Exercise per Week: 4 days    Minutes of Exercise per Session: 40 min  Recent Concern: Physical Activity - Insufficiently Active (01/22/2023)   Exercise Vital Sign    Days of Exercise per Week: 3 days    Minutes of Exercise per Session: 40 min  Stress: No Stress Concern Present (01/22/2023)   [employer] of Occupational Health - Occupational Stress Questionnaire    Feeling of Stress : Not at all  Social Connections: Unknown (01/22/2023)   Social Connection and Isolation Panel [NHANES]    Frequency of Communication with Friends and Family: More than three times a week    Frequency of Social Gatherings with Friends and Family: More than three times a week    Attends Religious Services: More than 4 times per year    Active Member of [employer] or Organizations: Yes    Attends Banker Meetings: More than 4 times per year    Marital Status: Patient declined  Intimate Partner Violence: Not At Risk (01/22/2023)   Humiliation, Afraid, Rape,  and Kick questionnaire    Fear of Current or Ex-Partner: No    Emotionally Abused: No    Physically Abused: No    Sexually Abused: No    Family History  Problem Relation Age of Onset   Heart attack Mother    Sudden death Mother    Stroke Father    Heart failure Father    Alcoholism Brother    Healthy Sister    Stroke Brother    Alpha-1 antitrypsin deficiency Daughter    Breast cancer Daughter    Prostate cancer Brother    Stroke Brother    Prostate cancer Brother    Healthy Brother    Prostate cancer Brother    Healthy Daughter    Diabetes Son    Arthritis Son     Current Outpatient Medications on File Prior to Visit  Medication Sig Dispense Refill   Cholecalciferol (VITAMIN D3 PO) Take 1 tablet by mouth daily.     diazepam (VALIUM) 5 MG tablet TAKE 1 TABLET ONCE A DAY AS NEEDED. 30 tablet 0   dofetilide (TIKOSYN) 500 MCG capsule Take 1 capsule (500 mcg total) by mouth 2 (two) times daily. 180 capsule 1   ELIQUIS 5 MG TABS tablet TAKE ONE  TABLET BY MOUTH TWICE DAILY 180 tablet 1   fluticasone (FLONASE) 50 MCG/ACT nasal spray Place 1 spray into both nostrils daily. 16 g 2   ipratropium (ATROVENT) 0.06 % nasal spray Place 2 sprays into both nostrils 4 (four) times daily as needed for rhinitis. 15 mL 12   Magnesium 400 MG CAPS Take 400 mg by mouth daily. 30 capsule 0   metoprolol tartrate (LOPRESSOR) 25 MG tablet Take 1/2 tablets (12.5 mg total) by mouth 2 (two) times daily. 90 tablet 2   nitroGLYCERIN (NITROSTAT) 0.4 MG SL tablet Place 1 tablet (0.4 mg total) under the tongue every 5 (five) minutes as needed for chest pain. 25 tablet 1   nystatin cream (MYCOSTATIN) Apply to affected rash twice a day as needed 30 g 0   pantoprazole (PROTONIX) 40 MG tablet Take 1 tablet (40 mg total) by mouth 2 (two) times daily. 180 tablet 3   sertraline (ZOLOFT) 100 MG tablet TAKE ONE TABLET BY MOUTH ONCE DAILY. 90 tablet 3   No current facility-administered medications on file prior to  visit.    Allergies  Allergen Reactions   Cefazolin Hives and Itching    Other reaction(s): severe hives   Pseudoephedrine Hcl     Other reaction(s): palpitations (moderate)   Ergotamine     unknown   Other Other (See Comments)   Pentobarbital     unknown   Pirfenidone     unknown   Caffeine Palpitations       Physical Exam Vitals requested from patient and listed below if patient had equipment and was able to obtain at home for this virtual visit: There were no vitals filed for this visit. Estimated body mass index is 42.5 kg/m as calculated from the following:   Height as of 03/09/24: 5\' 4"  (1.626 m).   Weight as of 03/09/24: 247 lb 9.6 oz (112.3 kg).  EKG (optional): deferred due to virtual visit  GENERAL: alert, oriented, no acute distress detected, full vision exam deferred due to pandemic and/or virtual encounter  PSYCH/NEURO: pleasant and cooperative, no obvious depression or anxiety, speech and thought processing grossly intact, Cognitive function grossly intact  Flowsheet Row Office Visit from 03/09/2024 in [facility]  PHQ-9 Total Score 5           03/11/2024    3:53 PM 03/09/2024    3:29 PM 10/15/2023   12:14 PM 04/04/2023    3:52 PM 01/22/2023   10:22 AM  Depression screen PHQ 2/9  Decreased Interest 0 2 0 1 0  Down, Depressed, Hopeless 1 1 0 1 0  PHQ - 2 Score 1 3 0 2 0  Altered sleeping  2 0 2   Tired, decreased energy   0 2   Change in appetite   0 2   Feeling bad or failure about yourself    0 2   Trouble concentrating   0 0   Moving slowly or fidgety/restless   0 0   Suicidal thoughts   0 2   PHQ-9 Score  5 0 12   Difficult doing work/chores   Not difficult at all Somewhat difficult        03/08/2022    8:00 AM 01/22/2023   10:25 AM 02/09/2024    4:14 PM 03/04/2024    1:38 PM 03/11/2024    3:52 PM  Fall Risk  Falls in the past year?  0 1 1 1   Was there an injury with  Fall?  0 0 1 0  Fall Risk Category Calculator   0 2 3 2   (RETIRED) Patient Fall Risk Level Moderate fall risk      Patient at Risk for Falls Due to  No Fall Risks     Fall risk Follow up  Falls prevention discussed   Education provided;Falls evaluation completed     SUMMARY AND PLAN:  Encounter for Medicare annual wellness exam   Discussed applicable health maintenance/preventive health measures and advised and referred or ordered per patient preferences: -discussed vaccines due and risks, can get at the pharmacy if she decides to do, she is considering -discussed bone density - she declined Health Maintenance  Topic Date Due   Zoster Vaccines- Shingrix (2 of 2) 03/16/2019   DTaP/Tdap/Td (3 - Td or Tdap) 04/07/2019   COVID-19 Vaccine (3 - Moderna risk series) 01/31/2020   INFLUENZA VACCINE  07/02/2024   Medicare Annual Wellness (AWV)  03/11/2025   Pneumonia Vaccine 89+ Years old  Completed   DEXA SCAN  Completed   HPV VACCINES  Aged Out   Meningococcal B Vaccine  Aged Out   MAMMOGRAM  Discontinued    Education and counseling on the following was provided based on the above review of health and a plan/checklist for the patient, along with additional information discussed, was provided for the patient in the patient instructions :  -Provided counseling and plan for increased risk of falling if applicable per above screening. Provided safe balance exercises that can be done at home to improve balance and discussed exercise guidelines for adults with include balance exercises at least 3 days per week.  -Advised and counseled on a healthy lifestyle - including the importance of a healthy diet, regular physical activity, social connections and stress management. -Reviewed patient's current diet. Advised and counseled. Discussed ways to blend soups, etc as she says she tolerated pureed foods better. A summary of a healthy diet was provided in the Patient Instructions.  -reviewed patient's current physical activity level and discussed  exercise guidelines for adults. Encouraged to continue exercise, chair exercise and PT exercises.  Discussed community resources and ideas for safe exercise at home to assist in meeting exercise guideline recommendations in a safe and healthy way.  -Advise yearly dental visits at minimum and regular eye exams   Follow up: see patient instructions     Patient Instructions  I really enjoyed getting to talk with you today! I am available on Tuesdays and Thursdays for virtual visits if you have any questions or concerns, or if I can be of any further assistance.   CHECKLIST FROM ANNUAL WELLNESS VISIT:  -Follow up (please call to schedule if not scheduled after visit):   -yearly for annual wellness visit with primary care office  Here is a list of your preventive care/health maintenance measures and the plan for each if any are due:  PLAN For any measures below that may be due:   Health Maintenance  Topic Date Due   Zoster Vaccines- Shingrix (2 of 2) 03/16/2019   DTaP/Tdap/Td (3 - Td or Tdap) 04/07/2019   COVID-19 Vaccine (3 - Moderna risk series) 01/31/2020   INFLUENZA VACCINE  07/02/2024   Medicare Annual Wellness (AWV)  03/11/2025   Pneumonia Vaccine 8+ Years old  Completed   DEXA SCAN  Completed   HPV VACCINES  Aged Out   Meningococcal B Vaccine  Aged Out   MAMMOGRAM  Discontinued    -See a dentist at least yearly  -  Get your eyes checked and then per your eye specialist's recommendations  -Other issues addressed today:   -I have included below further information regarding a healthy whole foods based diet, physical activity guidelines for adults, stress management and opportunities for social connections. I hope you find this information useful.    -----------------------------------------------------------------------------------------------------------------------------------------------------------------------------------------------------------------------------------------------------------    NUTRITION: -eat real food: lots of colorful vegetables (half the plate) and fruits -5-7 servings of vegetables and fruits per day (fresh or steamed is best), exp. 2 servings of vegetables with lunch and dinner and 2 servings of fruit per day. Berries and greens such as kale and collards are great choices.  -consume on a regular basis:  fresh fruits, fresh veggies, fish, nuts, seeds, healthy oils (such as olive oil, avocado oil), whole grains (make sure for bread/pasta/crackers/etc., that the first ingredient on label contains the word "whole"), legumes. -can eat small amounts of dairy and lean meat (no larger than the palm of your hand), but avoid processed meats such as ham, bacon, lunch meat, etc. -drink water -try to avoid fast food and pre-packaged foods, processed meat, ultra processed foods/beverages (donuts, candy, etc.) -most experts advise limiting sodium to < 2300mg  per day, should limit further is any chronic conditions such as high blood pressure, heart disease, diabetes, etc. The American Heart Association advised that < 1500mg  is is ideal -try to avoid foods/beverages that contain any ingredients with names you do not recognize  -try to avoid foods/beverages  with added sugar or sweeteners/sweets  -try to avoid sweet drinks (including diet drinks): soda, juice, Gatorade, sweet tea, power drinks, diet drinks -try to avoid white rice, white bread, pasta (unless whole grain)  EXERCISE GUIDELINES FOR ADULTS: -if you wish to increase your physical activity, do so gradually and with the approval of your doctor -STOP and seek medical care immediately if you have any chest pain, chest discomfort or trouble breathing when starting or  increasing exercise  -move and stretch your body, legs, feet and arms when sitting for long periods -Physical activity guidelines for optimal health in adults: -get at least 150 minutes per week of moderate exercise (can talk, but not sing); this is about 20-30 minutes of sustained activity 5-7 days per week or two 10-15 minute episodes of sustained activity 5-7 days per week -do some muscle building/resistance training/strength training at least 2 days per week  -balance exercises 3+ days per week:   Stand somewhere where you have something sturdy to hold onto if you lose balance    1) lift up on toes, then back down, start with 5x per day and work up to 20x   2) stand and lift one leg straight out to the side so that foot is a few inches of the floor, start with 5x each side and work up to 20x each side   3) stand on one foot, start with 5 seconds each side and work up to 20 seconds on each side  If you need ideas or help with getting more active:  -Silver sneakers https://tools.silversneakers.com  -Walk with a Doc: http://www.duncan-williams.com/  -try to include resistance (weight lifting/strength building) and balance exercises twice per week: or the following link for ideas: http://castillo-powell.com/  BuyDucts.dk  STRESS MANAGEMENT: -can try meditating, or just sitting quietly with deep breathing while intentionally relaxing all parts of your body for 5 minutes daily -if you need further help with stress, anxiety or depression please follow up with your primary doctor or contact the wonderful folks at [employer]  Behavioral Health: [phone]  SOCIAL CONNECTIONS: -options in [place] if you wish to engage in more social and exercise related activities:  -Silver sneakers https://tools.silversneakers.com  -Walk with a Doc: http://www.duncan-williams.com/  -Check out the [facility] Active Adults 50+  section on the [place] of [employer] (hiking clubs, book clubs, cards and games, chess, exercise classes, aquatic classes and much more) - see the website for details: https://www.[place].gov/departments/parks-recreation/active-adults50  -YouTube has lots of exercise videos for different ages and abilities as well  -[facility] (a variety of indoor and outdoor inperson activities for adults). (641) [phone]. [address]  -Virtual Online Classes (a variety of topics): see seniorplanet.org or call [phone]  -consider volunteering at a school, hospice center, church, senior center or elsewhere            Terressa Koyanagi, DO

## 2024-03-11 NOTE — Telephone Encounter (Signed)
 Called again and LVM for the facility. Will try again this afternoon

## 2024-03-11 NOTE — Telephone Encounter (Signed)
 She will have to call Dr. Kenna Gilbert office : [phone] and check on the status.  [name] [name] please make sure the referral was processed  Also please change the home physical therapy program to [facility]

## 2024-03-11 NOTE — Patient Instructions (Signed)
 I really enjoyed getting to talk with you today! I am available on Tuesdays and Thursdays for virtual visits if you have any questions or concerns, or if I can be of any further assistance.   CHECKLIST FROM ANNUAL WELLNESS VISIT:  -Follow up (please call to schedule if not scheduled after visit):   -yearly for annual wellness visit with primary care office  Here is a list of your preventive care/health maintenance measures and the plan for each if any are due:  PLAN For any measures below that may be due:   Health Maintenance  Topic Date Due   Zoster Vaccines- Shingrix (2 of 2) 03/16/2019   DTaP/Tdap/Td (3 - Td or Tdap) 04/07/2019   COVID-19 Vaccine (3 - Moderna risk series) 01/31/2020   INFLUENZA VACCINE  07/02/2024   Medicare Annual Wellness (AWV)  03/11/2025   Pneumonia Vaccine 41+ Years old  Completed   DEXA SCAN  Completed   HPV VACCINES  Aged Out   Meningococcal B Vaccine  Aged Out   MAMMOGRAM  Discontinued    -See a dentist at least yearly  -Get your eyes checked and then per your eye specialist's recommendations  -Other issues addressed today:   -I have included below further information regarding a healthy whole foods based diet, physical activity guidelines for adults, stress management and opportunities for social connections. I hope you find this information useful.   -----------------------------------------------------------------------------------------------------------------------------------------------------------------------------------------------------------------------------------------------------------    NUTRITION: -eat real food: lots of colorful vegetables (half the plate) and fruits -5-7 servings of vegetables and fruits per day (fresh or steamed is best), exp. 2 servings of vegetables with lunch and dinner and 2 servings of fruit per day. Berries and greens such as kale and collards are great choices.  -consume on a regular basis:  fresh fruits,  fresh veggies, fish, nuts, seeds, healthy oils (such as olive oil, avocado oil), whole grains (make sure for bread/pasta/crackers/etc., that the first ingredient on label contains the word "whole"), legumes. -can eat small amounts of dairy and lean meat (no larger than the palm of your hand), but avoid processed meats such as ham, bacon, lunch meat, etc. -drink water -try to avoid fast food and pre-packaged foods, processed meat, ultra processed foods/beverages (donuts, candy, etc.) -most experts advise limiting sodium to < 2300mg  per day, should limit further is any chronic conditions such as high blood pressure, heart disease, diabetes, etc. The American Heart Association advised that < 1500mg  is is ideal -try to avoid foods/beverages that contain any ingredients with names you do not recognize  -try to avoid foods/beverages  with added sugar or sweeteners/sweets  -try to avoid sweet drinks (including diet drinks): soda, juice, Gatorade, sweet tea, power drinks, diet drinks -try to avoid white rice, white bread, pasta (unless whole grain)  EXERCISE GUIDELINES FOR ADULTS: -if you wish to increase your physical activity, do so gradually and with the approval of your doctor -STOP and seek medical care immediately if you have any chest pain, chest discomfort or trouble breathing when starting or increasing exercise  -move and stretch your body, legs, feet and arms when sitting for long periods -Physical activity guidelines for optimal health in adults: -get at least 150 minutes per week of moderate exercise (can talk, but not sing); this is about 20-30 minutes of sustained activity 5-7 days per week or two 10-15 minute episodes of sustained activity 5-7 days per week -do some muscle building/resistance training/strength training at least 2 days per week  -balance exercises 3+ days  per week:   Stand somewhere where you have something sturdy to hold onto if you lose balance    1) lift up on toes, then  back down, start with 5x per day and work up to 20x   2) stand and lift one leg straight out to the side so that foot is a few inches of the floor, start with 5x each side and work up to 20x each side   3) stand on one foot, start with 5 seconds each side and work up to 20 seconds on each side  If you need ideas or help with getting more active:  -Silver sneakers https://tools.silversneakers.com  -Walk with a Doc: http://www.duncan-williams.com/  -try to include resistance (weight lifting/strength building) and balance exercises twice per week: or the following link for ideas: http://castillo-powell.com/  BuyDucts.dk  STRESS MANAGEMENT: -can try meditating, or just sitting quietly with deep breathing while intentionally relaxing all parts of your body for 5 minutes daily -if you need further help with stress, anxiety or depression please follow up with your primary doctor or contact the wonderful folks at [facility]: [phone]  SOCIAL CONNECTIONS: -options in [place] if you wish to engage in more social and exercise related activities:  -Silver sneakers https://tools.silversneakers.com  -Walk with a Doc: http://www.duncan-williams.com/  -Check out the [facility] Active Adults 50+ section on the [place] of [employer] (hiking clubs, book clubs, cards and games, chess, exercise classes, aquatic classes and much more) - see the website for details: https://www.[place].gov/departments/parks-recreation/active-adults50  -YouTube has lots of exercise videos for different ages and abilities as well  -[facility] (a variety of indoor and outdoor inperson activities for adults). [phone]. [address]  -Virtual Online Classes (a variety of topics): see seniorplanet.org or call [phone]  -consider volunteering at a school, hospice center, church, senior  center or elsewhere

## 2024-03-11 NOTE — Telephone Encounter (Signed)
 Faxed over info needed for pt to be seen. NFN at this time

## 2024-03-18 NOTE — Telephone Encounter (Signed)
 NFN

## 2024-03-23 DIAGNOSIS — R2681 Unsteadiness on feet: Secondary | ICD-10-CM | POA: Diagnosis not present

## 2024-03-23 DIAGNOSIS — Z9181 History of falling: Secondary | ICD-10-CM | POA: Diagnosis not present

## 2024-03-23 DIAGNOSIS — R278 Other lack of coordination: Secondary | ICD-10-CM | POA: Diagnosis not present

## 2024-03-23 DIAGNOSIS — M6281 Muscle weakness (generalized): Secondary | ICD-10-CM | POA: Diagnosis not present

## 2024-03-25 DIAGNOSIS — M6281 Muscle weakness (generalized): Secondary | ICD-10-CM | POA: Diagnosis not present

## 2024-03-25 DIAGNOSIS — R278 Other lack of coordination: Secondary | ICD-10-CM | POA: Diagnosis not present

## 2024-03-25 DIAGNOSIS — Z9181 History of falling: Secondary | ICD-10-CM | POA: Diagnosis not present

## 2024-03-25 DIAGNOSIS — R2681 Unsteadiness on feet: Secondary | ICD-10-CM | POA: Diagnosis not present

## 2024-03-29 DIAGNOSIS — M6281 Muscle weakness (generalized): Secondary | ICD-10-CM | POA: Diagnosis not present

## 2024-03-29 DIAGNOSIS — R2681 Unsteadiness on feet: Secondary | ICD-10-CM | POA: Diagnosis not present

## 2024-03-29 DIAGNOSIS — Z9181 History of falling: Secondary | ICD-10-CM | POA: Diagnosis not present

## 2024-03-29 DIAGNOSIS — R278 Other lack of coordination: Secondary | ICD-10-CM | POA: Diagnosis not present

## 2024-03-30 ENCOUNTER — Ambulatory Visit (INDEPENDENT_AMBULATORY_CARE_PROVIDER_SITE_OTHER): Admitting: Family Medicine

## 2024-03-30 ENCOUNTER — Encounter: Payer: Self-pay | Admitting: Family Medicine

## 2024-03-30 VITALS — BP 114/72 | HR 68 | Temp 97.3°F | Ht 64.0 in | Wt 247.3 lb

## 2024-03-30 DIAGNOSIS — I959 Hypotension, unspecified: Secondary | ICD-10-CM | POA: Diagnosis not present

## 2024-03-30 DIAGNOSIS — R42 Dizziness and giddiness: Secondary | ICD-10-CM | POA: Diagnosis not present

## 2024-03-30 DIAGNOSIS — I2510 Atherosclerotic heart disease of native coronary artery without angina pectoris: Secondary | ICD-10-CM | POA: Diagnosis not present

## 2024-03-30 DIAGNOSIS — D649 Anemia, unspecified: Secondary | ICD-10-CM

## 2024-03-30 NOTE — Progress Notes (Signed)
 Established Patient Office Visit  Subjective   [name], female    DOB: June 30, 1941  Age: 83 y.o. MRN: [id number]  Chief Complaint  Patient presents with   Blood Pressure Check    Blood pressure dropping     HPI   Kenlea is here with concerns for some recent low blood pressure readings.  She states she was at pulmonary and had a reading of 80/60 and 1 day at PT had reading of 82/60.  Her blood pressure seems to drop when first standing.  No actual loss of consciousness.  Blood pressure up better today.  She is trying to stay well-hydrated.  She has chronic problems including history of atrial fibrillation, tachybradycardia syndrome, history of TIA, obstructive sleep apnea, pulmonary fibrosis, GERD.  Not consistently using her CPAP.  She denies any recent diarrhea, nausea, or vomiting.  No dysuria.  Had recent labs including basic metabolic panel, TSH, vitamin D  level which were all normal.  Note, she had labs in the ER back in March with hemoglobin of 10.5 which is a substantial drop from previous hemoglobin of 15.8 on 7/24.  She is not aware of any recent melena or hematemesis. No recent abdominal pain.  She has had some mild vague cognitive deficits recently with difficulty sometimes remembering directions but no headaches.  No focal weakness.  Still drives.  Past Medical History:  Diagnosis Date   Alpha-1-antitrypsin deficiency carrier    Arthritis    "all over"   Atherosclerosis of aorta (HCC)    Atrial fibrillation (HCC)    Back pain    Complication of anesthesia    "I woke up during 2 different procedures" (10/04/2014)   Depression    Diastolic dysfunction    Eating disorder    Frequent UTI    GERD (gastroesophageal reflux disease)    Heart disease    Hypertension    Hypertriglyceridemia    Insulin  resistance    Long term current use of anticoagulant    Multiple lung nodules    Muscular deconditioning    MVP (mitral valve prolapse)    Obesity     OSA on CPAP    Osteoarthritis    Osteopenia    unspecified location   Pacemaker    PAF (paroxysmal atrial fibrillation) (HCC)    Positive ANA (antinuclear antibody)    Presbyesophagus    Presence of permanent cardiac pacemaker    Primary osteoarthritis of both hands    neck and spine and hips   Pulmonary fibrosis (HCC)    followed by pulmonolgy   PVC's (premature ventricular contractions)    Shortness of breath    Sinus arrest 10/2014   s/p Medtronic Advisa model A2DR01 serial number [id number]   Sleep apnea    Stroke (HCC) 01/2014   denies deficits on 10/04/2014   Tachy-brady syndrome (HCC) 10/2014   TIA (transient ischemic attack)    Vitamin D  deficiency    Past Surgical History:  Procedure Laterality Date   CARDIOVERSION N/A 09/02/2017   Procedure: CARDIOVERSION;  Surgeon: Luana Rumple, MD;  Location: [facility] ENDOSCOPY;  Service: Cardiovascular;  Laterality: N/A;   COLONOSCOPY  2019   ESOPHAGEAL DILATION     EXCISION VAGINAL CYST     benign nodules   INSERT / REPLACE / REMOVE PACEMAKER  10/04/2014   Medtronic Advisa model A2DR01 serial number [id number] H   LOOP RECORDER EXPLANT N/A 10/04/2014   Procedure: LOOP RECORDER EXPLANT;  Surgeon: Luana Rumple, MD;  Location: [facility]  CATH LAB;  Service: Cardiovascular;  Laterality: N/A;   LOOP RECORDER IMPLANT N/A 04/19/2014   Procedure: LOOP RECORDER IMPLANT;  Surgeon: Luana Rumple, MD;  Location: [facility] CATH LAB;  Service: Cardiovascular;  Laterality: N/A;   NM MYOCAR PERF WALL MOTION  12/18/2007   normal   PERMANENT PACEMAKER INSERTION N/A 10/04/2014   Procedure: PERMANENT PACEMAKER INSERTION;  Surgeon: Luana Rumple, MD;  Location: [facility] CATH LAB;  Service: Cardiovascular;  Laterality: N/A;   TUBAL LIGATION     US  ECHOCARDIOGRAPHY  05/15/2010   LA mildly dilated,mild mitral annular ca+, AOV mildly sclerotic, mild asymmetric LVH    reports that she quit smoking about 53 years ago. Her smoking use included cigarettes. She started smoking about  71 years ago. She has a 36 pack-year smoking history. She has never used smokeless tobacco. She reports current alcohol use. She reports that she does not use drugs. family history includes Alcoholism in her brother; Alpha-1 antitrypsin deficiency in her daughter; Arthritis in her son; Breast cancer in her daughter; Diabetes in her son; Healthy in her brother, daughter, and sister; Heart attack in her mother; Heart failure in her father; Prostate cancer in her brother, brother, and brother; Stroke in her brother, brother, and father; Sudden death in her mother. Allergies  Allergen Reactions   Cefazolin  Hives and Itching    Other reaction(s): severe hives   Pseudoephedrine Hcl     Other reaction(s): palpitations (moderate)   Ergotamine     unknown   Other Other (See Comments)   Pentobarbital     unknown   Pirfenidone      unknown   Caffeine Palpitations    Review of Systems  Constitutional:  Negative for chills, fever, malaise/fatigue and weight loss.  Eyes:  Negative for blurred vision.  Respiratory:  Negative for shortness of breath.   Cardiovascular:  Negative for chest pain.  Genitourinary:  Negative for dysuria.  Neurological:  Positive for dizziness. Negative for weakness and headaches.      Objective:     BP 114/72 (BP Location: Left Arm, Patient Position: Sitting, Cuff Size: Normal)   Pulse 68   Temp (!) 97.3 F (36.3 C) (Oral)   Ht 5\' 4"  (1.626 m)   Wt 247 lb 4.8 oz (112.2 kg)   LMP  (LMP Unknown)   SpO2 97%   BMI 42.45 kg/m  BP Readings from Last 3 Encounters:  03/30/24 114/72  03/09/24 128/76  03/04/24 104/60   Wt Readings from Last 3 Encounters:  03/30/24 247 lb 4.8 oz (112.2 kg)  03/09/24 247 lb 9.6 oz (112.3 kg)  03/04/24 251 lb (113.9 kg)      Physical Exam Vitals reviewed.  Constitutional:      General: She is not in acute distress.    Appearance: She is not ill-appearing.  Cardiovascular:     Rate and Rhythm: Normal rate and regular rhythm.   Pulmonary:     Effort: Pulmonary effort is normal.     Breath sounds: Normal breath sounds. No wheezing or rales.  Musculoskeletal:     Right lower leg: No edema.     Left lower leg: No edema.  Neurological:     Mental Status: She is alert.      No results found for any visits on 03/30/24.  Last CBC Lab Results  Component Value Date   WBC 11.9 (H) 02/09/2024   HGB 10.8 (L) 02/09/2024   HCT 35.9 (L) 02/09/2024   MCV 92.3 02/09/2024   MCH 27.8  02/09/2024   RDW 14.0 02/09/2024   PLT 198 02/09/2024   Last metabolic panel Lab Results  Component Value Date   GLUCOSE 102 (H) 03/04/2024   NA 135 03/04/2024   K 4.2 03/04/2024   CL 102 03/04/2024   CO2 27 03/04/2024   BUN 16 03/04/2024   CREATININE 1.03 03/04/2024   GFR 50.33 (L) 03/04/2024   CALCIUM 8.9 03/04/2024   PROT 7.0 06/10/2023   ALBUMIN 3.2 (L) 06/10/2023   LABGLOB 2.4 07/21/2019   AGRATIO 1.7 07/21/2019   BILITOT 0.6 06/10/2023   ALKPHOS 76 06/10/2023   AST 25 06/10/2023   ALT 18 06/10/2023   ANIONGAP 9 01/14/2024   Last thyroid  functions Lab Results  Component Value Date   TSH 3.14 03/09/2024   T3TOTAL 133 09/22/2018      The ASCVD Risk score (Arnett DK, et al., 2019) failed to calculate for the following reasons:   The 2019 ASCVD risk score is only valid for ages 44 to 32   Risk score cannot be calculated because patient has a medical history suggesting prior/existing ASCVD    Assessment & Plan:   #1 recent episodes of low blood pressure.  Repeat blood pressure today left arm seated 100/60 and standing 92/58.  She takes very low-dose metoprolol  but no other blood pressure medications.  She states she is conscious to try to stay well-hydrated.  Did have recent hemoglobin drop of 10.8 as below.  -Discussed importance of adequate hydration and being a little bit more liberal if necessarily with sodium intake. - Change positions slowly before getting up to walk - Recheck CBC - Continue close  monitoring of blood pressure and be in touch if consistent systolic readings less than 100  #2 recent normocytic anemia of uncertain significance.  Previous hemoglobin had been 15.6 with reading back in March 10.8 when she went to the ER.  Recheck CBC today   No follow-ups on file.    Glean Lamy, MD

## 2024-03-30 NOTE — Patient Instructions (Signed)
 Stay well hydrated  Change positions slowly   After standing, wait a few seconds before you start to ambulate.

## 2024-03-31 LAB — CBC WITH DIFFERENTIAL/PLATELET
Basophils Absolute: 0.1 10*3/uL (ref 0.0–0.1)
Basophils Relative: 1.2 % (ref 0.0–3.0)
Eosinophils Absolute: 0.5 10*3/uL (ref 0.0–0.7)
Eosinophils Relative: 4.9 % (ref 0.0–5.0)
HCT: 45.4 % (ref 36.0–46.0)
Hemoglobin: 14.9 g/dL (ref 12.0–15.0)
Lymphocytes Relative: 26.9 % (ref 12.0–46.0)
Lymphs Abs: 2.6 10*3/uL (ref 0.7–4.0)
MCHC: 32.9 g/dL (ref 30.0–36.0)
MCV: 86.5 fl (ref 78.0–100.0)
Monocytes Absolute: 1.2 10*3/uL — ABNORMAL HIGH (ref 0.1–1.0)
Monocytes Relative: 12.2 % — ABNORMAL HIGH (ref 3.0–12.0)
Neutro Abs: 5.4 10*3/uL (ref 1.4–7.7)
Neutrophils Relative %: 54.8 % (ref 43.0–77.0)
Platelets: 256 10*3/uL (ref 150.0–400.0)
RBC: 5.25 Mil/uL — ABNORMAL HIGH (ref 3.87–5.11)
RDW: 14.4 % (ref 11.5–15.5)
WBC: 9.8 10*3/uL (ref 4.0–10.5)

## 2024-04-04 DIAGNOSIS — G4733 Obstructive sleep apnea (adult) (pediatric): Secondary | ICD-10-CM | POA: Diagnosis not present

## 2024-04-05 DIAGNOSIS — Z9181 History of falling: Secondary | ICD-10-CM | POA: Diagnosis not present

## 2024-04-05 DIAGNOSIS — M6281 Muscle weakness (generalized): Secondary | ICD-10-CM | POA: Diagnosis not present

## 2024-04-05 DIAGNOSIS — R1319 Other dysphagia: Secondary | ICD-10-CM | POA: Diagnosis not present

## 2024-04-05 DIAGNOSIS — R2681 Unsteadiness on feet: Secondary | ICD-10-CM | POA: Diagnosis not present

## 2024-04-05 DIAGNOSIS — R278 Other lack of coordination: Secondary | ICD-10-CM | POA: Diagnosis not present

## 2024-04-05 DIAGNOSIS — R41841 Cognitive communication deficit: Secondary | ICD-10-CM | POA: Diagnosis not present

## 2024-04-05 DIAGNOSIS — R498 Other voice and resonance disorders: Secondary | ICD-10-CM | POA: Diagnosis not present

## 2024-04-07 DIAGNOSIS — R41841 Cognitive communication deficit: Secondary | ICD-10-CM | POA: Diagnosis not present

## 2024-04-07 DIAGNOSIS — R1319 Other dysphagia: Secondary | ICD-10-CM | POA: Diagnosis not present

## 2024-04-07 DIAGNOSIS — M6281 Muscle weakness (generalized): Secondary | ICD-10-CM | POA: Diagnosis not present

## 2024-04-07 DIAGNOSIS — R498 Other voice and resonance disorders: Secondary | ICD-10-CM | POA: Diagnosis not present

## 2024-04-07 DIAGNOSIS — R278 Other lack of coordination: Secondary | ICD-10-CM | POA: Diagnosis not present

## 2024-04-07 DIAGNOSIS — Z9181 History of falling: Secondary | ICD-10-CM | POA: Diagnosis not present

## 2024-04-07 DIAGNOSIS — R2681 Unsteadiness on feet: Secondary | ICD-10-CM | POA: Diagnosis not present

## 2024-04-12 DIAGNOSIS — R41841 Cognitive communication deficit: Secondary | ICD-10-CM | POA: Diagnosis not present

## 2024-04-12 DIAGNOSIS — R498 Other voice and resonance disorders: Secondary | ICD-10-CM | POA: Diagnosis not present

## 2024-04-12 DIAGNOSIS — R1319 Other dysphagia: Secondary | ICD-10-CM | POA: Diagnosis not present

## 2024-04-13 DIAGNOSIS — R278 Other lack of coordination: Secondary | ICD-10-CM | POA: Diagnosis not present

## 2024-04-13 DIAGNOSIS — M6281 Muscle weakness (generalized): Secondary | ICD-10-CM | POA: Diagnosis not present

## 2024-04-13 DIAGNOSIS — R2681 Unsteadiness on feet: Secondary | ICD-10-CM | POA: Diagnosis not present

## 2024-04-13 DIAGNOSIS — Z9181 History of falling: Secondary | ICD-10-CM | POA: Diagnosis not present

## 2024-04-14 DIAGNOSIS — R498 Other voice and resonance disorders: Secondary | ICD-10-CM | POA: Diagnosis not present

## 2024-04-14 DIAGNOSIS — R1319 Other dysphagia: Secondary | ICD-10-CM | POA: Diagnosis not present

## 2024-04-14 DIAGNOSIS — R41841 Cognitive communication deficit: Secondary | ICD-10-CM | POA: Diagnosis not present

## 2024-04-15 DIAGNOSIS — M6281 Muscle weakness (generalized): Secondary | ICD-10-CM | POA: Diagnosis not present

## 2024-04-15 DIAGNOSIS — R2681 Unsteadiness on feet: Secondary | ICD-10-CM | POA: Diagnosis not present

## 2024-04-15 DIAGNOSIS — R278 Other lack of coordination: Secondary | ICD-10-CM | POA: Diagnosis not present

## 2024-04-15 DIAGNOSIS — Z9181 History of falling: Secondary | ICD-10-CM | POA: Diagnosis not present

## 2024-04-16 ENCOUNTER — Telehealth: Payer: Self-pay | Admitting: *Deleted

## 2024-04-16 DIAGNOSIS — R1319 Other dysphagia: Secondary | ICD-10-CM | POA: Diagnosis not present

## 2024-04-16 DIAGNOSIS — R498 Other voice and resonance disorders: Secondary | ICD-10-CM | POA: Diagnosis not present

## 2024-04-16 DIAGNOSIS — R41841 Cognitive communication deficit: Secondary | ICD-10-CM | POA: Diagnosis not present

## 2024-04-16 MED ORDER — FLUTICASONE PROPIONATE 50 MCG/ACT NA SUSP: NASAL | 0 refills | Status: AC

## 2024-04-16 NOTE — Telephone Encounter (Signed)
 Copied from CRM [id number]. Topic: Clinical - Medical Advice >> Apr 16, 2024 10:32 AM [name] [name] wrote: Reason for CRM: Patient is calling in stating she has a bad stuffy nose and a lot of green mucus. Patient is asking if Dr. Darren Em could call in one of the two nasal sprays she has had in the past, which ever he feels would be better, to her pharmacy [facility]. Please advise patient with a call back.   While looking in the med. list patient has been prescribed : ipratropium (ATROVENT ) 0.06 % nasal spray [[id number]] fluticasone  (FLONASE ) 50 MCG/ACT nasal spray [[id number]]

## 2024-04-16 NOTE — Telephone Encounter (Signed)
Rx done and the patient was informed. ?

## 2024-04-19 DIAGNOSIS — R1319 Other dysphagia: Secondary | ICD-10-CM | POA: Diagnosis not present

## 2024-04-19 DIAGNOSIS — R498 Other voice and resonance disorders: Secondary | ICD-10-CM | POA: Diagnosis not present

## 2024-04-19 DIAGNOSIS — R41841 Cognitive communication deficit: Secondary | ICD-10-CM | POA: Diagnosis not present

## 2024-04-20 ENCOUNTER — Other Ambulatory Visit (HOSPITAL_COMMUNITY): Payer: Self-pay

## 2024-04-20 DIAGNOSIS — R2681 Unsteadiness on feet: Secondary | ICD-10-CM | POA: Diagnosis not present

## 2024-04-20 DIAGNOSIS — Z9181 History of falling: Secondary | ICD-10-CM | POA: Diagnosis not present

## 2024-04-20 DIAGNOSIS — M6281 Muscle weakness (generalized): Secondary | ICD-10-CM | POA: Diagnosis not present

## 2024-04-20 DIAGNOSIS — R278 Other lack of coordination: Secondary | ICD-10-CM | POA: Diagnosis not present

## 2024-04-20 MED ORDER — DOFETILIDE 500 MCG PO CAPS: 500.0000 ug | ORAL_CAPSULE | Freq: Two times a day (BID) | ORAL | 1 refills | Status: DC

## 2024-04-21 ENCOUNTER — Ambulatory Visit: Payer: Self-pay

## 2024-04-21 NOTE — Telephone Encounter (Signed)
 I attempted to contact patient and left detailed message informing patient to seek emergency care treatment for symptoms

## 2024-04-21 NOTE — Telephone Encounter (Signed)
 Chief Complaint: hallucinations Symptoms: hallucinations, fatigue, weakness, tremors, lightheadedness  Frequency: few months Pertinent Negatives: Patient denies fever, CP, H/A, vomiting Disposition: [x] ED /[] Urgent Care (no appt availability in office) / [] Appointment(In office/virtual)/ []  [facility]/ [] Home Care/ [x] Refused Recommended Disposition /[] [facility]/ []  Follow-up with PCP Additional Notes: Pt reports multiple symptoms lately. Pt is concerned she has Lewy Body dementia. Pt reports recent hallucinations and states she is seeing things that aren't there. Pt denies hallucinations at this current time but states a couple days ago an artificial plant was moving and swirling. Pt reports multiple symptoms for months: fatigue, weakness, "wobbly", lightheadedness, tremors in her hands, legs shaking, feeling like someone is with her when she is in fact alone, and states she is sleeping more during the day. Pt states these are all symptoms of Lewy Body dementia. Pt denies urinary symptoms. Pt denies fever. Pt denies CP. Pt states she has SOB on exertion and intermittent SOB at rest due to pulmonary fibrosis. Pt states she has fallen due to being unsteady on her feet but has not fallen recently. RN advised pt she go to the ED. Pt declined to do that at this time. Pt states she would like an office visit and a referral to a neurologist. RN advised pt RN would relay concern to the office for follow-up. RN advised the pt if she begins to hallucinate again, falls, has CP, or worsening SOB she needs to call 911. Pt verbalized understanding.    Reason for Disposition  Patient sounds very sick or weak to the triager  Answer Assessment - Initial Assessment Questions 1. LEVEL OF CONSCIOUSNESS: "How is he (she, the patient) acting right now?" (e.g., alert-oriented, confused, lethargic, stuporous, comatose)     Pt appears alert and oriented during this phone call; denies confusion about  who she is, where she is, or what she is doing; pt states she is sleeping more frequently during the day 2. ONSET: "When did the confusion start?"  (minutes, hours, days)     Pt states she saw a plant move on it's own a couple days ago which really rattled her; pt states she feels like someone is in the room with her; "since I am stationary these days, time is not the same meaning, the days run together, but I have felt this way for 3-4 months" 3. PATTERN "Does this come and go, or has it been constant since it started?"  "Is it present now?"     Comes and goes  4. ALCOHOL or DRUGS: "Has he been drinking alcohol or taking any drugs?"      None  5. NARCOTIC MEDICINES: "Has he been receiving any narcotic medications?" (e.g., morphine, Vicodin)     None  6. CAUSE: "What do you think is causing the confusion?"      Pt states she believes she has Lewy Body dementia 7. OTHER SYMPTOMS: "Are there any other symptoms?" (e.g., difficulty breathing, headache, fever, weakness)  Pt feels like "someone is with her", visual disturbance ("looking at an artificial plant and it's swirling"), "leg tremor", "my hand trembles", "I have orthostatic low blood pressure", "swallowing problems and I need surgery for my esophagus", "weakness and fatigue", pt states she has lost or smell or taste, sleeping often during the day, pt states she drops half the things she picks up -- all symptoms of Lewy Body dementia, which she thinks she has   Hears chairs falling in a gym at night, jumps out of  bed. Endorses frequent bladder infections but denies one right now. Uses Azo and cranberry. Pt states she is "almost falling", "people have had to stabilize me." Pt states she has fallen several times but not recently. Denies vomiting or H/A  Endorses CHF and pulmonary fibrosis. Denies worsening breathing. Endorses SOB at rest "once in a while." Denies CP. Endorses weakness "almost all the time", "fatigue all the time." States her BP was  low recently at a doctor's appt. Has not checked her BP in a couple days. Endorses lightheadedness and "feeling wobbly"; "I don't feel strong", states they want her to use a walker all the time, states she is careful. Endorses she is "wobbly" every day - states this has been the case "for a while now"  Protocols used: Confusion - Delirium-A-AH

## 2024-04-21 NOTE — Telephone Encounter (Signed)
 Specialist attempted to warm transfer the pt to this RN. However, the pt was not on the line. RN called the pt back and LVM with a callback number.   Copied from CRM [id number]. Topic: Clinical - Red Word Triage >> Apr 21, 2024  2:55 PM [name] [name] wrote: Red Word that prompted transfer to Nurse Triage: Hallucinations, said she is seeing things moving that aren't. Been going on for a bit now but had a bad incident a week ago, artificial plant was moving.

## 2024-04-21 NOTE — Telephone Encounter (Signed)
 Left a message for the patient to return my call to advise patient to be seen at ED

## 2024-04-22 DIAGNOSIS — Z9181 History of falling: Secondary | ICD-10-CM | POA: Diagnosis not present

## 2024-04-22 DIAGNOSIS — M6281 Muscle weakness (generalized): Secondary | ICD-10-CM | POA: Diagnosis not present

## 2024-04-22 DIAGNOSIS — R2681 Unsteadiness on feet: Secondary | ICD-10-CM | POA: Diagnosis not present

## 2024-04-22 DIAGNOSIS — R278 Other lack of coordination: Secondary | ICD-10-CM | POA: Diagnosis not present

## 2024-04-22 NOTE — Telephone Encounter (Signed)
 Patient informed of the message and voiced understanding. Follow up scheduled

## 2024-04-27 ENCOUNTER — Encounter: Payer: Self-pay | Admitting: Family Medicine

## 2024-04-27 ENCOUNTER — Ambulatory Visit (INDEPENDENT_AMBULATORY_CARE_PROVIDER_SITE_OTHER): Admitting: Family Medicine

## 2024-04-27 VITALS — BP 108/64 | HR 65 | Temp 97.5°F | Wt 243.6 lb

## 2024-04-27 DIAGNOSIS — R278 Other lack of coordination: Secondary | ICD-10-CM | POA: Diagnosis not present

## 2024-04-27 DIAGNOSIS — R438 Other disturbances of smell and taste: Secondary | ICD-10-CM | POA: Diagnosis not present

## 2024-04-27 DIAGNOSIS — M6281 Muscle weakness (generalized): Secondary | ICD-10-CM | POA: Diagnosis not present

## 2024-04-27 DIAGNOSIS — R251 Tremor, unspecified: Secondary | ICD-10-CM

## 2024-04-27 DIAGNOSIS — Z9181 History of falling: Secondary | ICD-10-CM | POA: Diagnosis not present

## 2024-04-27 DIAGNOSIS — R4182 Altered mental status, unspecified: Secondary | ICD-10-CM

## 2024-04-27 DIAGNOSIS — Z6841 Body Mass Index (BMI) 40.0 and over, adult: Secondary | ICD-10-CM | POA: Diagnosis not present

## 2024-04-27 DIAGNOSIS — R2681 Unsteadiness on feet: Secondary | ICD-10-CM | POA: Diagnosis not present

## 2024-04-27 DIAGNOSIS — R2689 Other abnormalities of gait and mobility: Secondary | ICD-10-CM

## 2024-04-27 MED ORDER — DIAZEPAM 5 MG PO TABS: ORAL_TABLET | ORAL | 1 refills | Status: AC

## 2024-04-27 NOTE — Progress Notes (Signed)
 Established Patient Office Visit  Subjective   [name], female    DOB: May 24, 1941  Age: 83 y.o. MRN: [id number]  Chief Complaint  Patient presents with   Altered Mental Status    HPI   [name] [name] has history of atrial fibrillation, tachybradycardia syndrome, history of TIA, obstructive sleep apnea, pulmonary fibrosis, GERD, chronic anxiety, recurrent depression, dyslipidemia.  [name] [name] had sent in message last week of possible visual "hallucinations ".  On further questioning [name] [name] states [name] [name] had wakened up a few times "feeling like someone is in the room ".  [name] [name] states [name] [name] had knowledge that no one was there and [name] [name] did not actually see another person but had a strong sensation of someone being there.  This lasted about 5 minutes.  [name] [name] then had episode the other day where [name] [name] was in her den and was looking at some artificial flowers in a vase and the artificial flowers seemed to be moving back-and-forth.  [name] [name] has had some ongoing concerns recently of intermittent lower extremity weakness and intermittent tremors right lower extremity greater than left.  Mild hand tremors.  [name] [name] has been dropping things more often than usual.  Increased general fatigue.  [name] [name] has also had concern for possible orthostatic changes with change in position but not consistently.  [name] [name] has had some decreased taste and smell recently.  Given constellation of concerns including visual hallucination, increased tremor, falls, possible blood pressure instability, hyposmia patient was concerned about possible Lewy body dementia.  Past Medical History:  Diagnosis Date   Alpha-1-antitrypsin deficiency carrier    Arthritis    "all over"   Atherosclerosis of aorta (HCC)    Atrial fibrillation (HCC)    Back pain    Complication of anesthesia    "I woke up during 2 different procedures" (10/04/2014)   Depression    Diastolic dysfunction    Eating disorder    Frequent UTI    GERD (gastroesophageal reflux  disease)    Heart disease    Hypertension    Hypertriglyceridemia    Insulin  resistance    Long term current use of anticoagulant    Multiple lung nodules    Muscular deconditioning    MVP (mitral valve prolapse)    Obesity    OSA on CPAP    Osteoarthritis    Osteopenia    unspecified location   Pacemaker    PAF (paroxysmal atrial fibrillation) (HCC)    Positive ANA (antinuclear antibody)    Presbyesophagus    Presence of permanent cardiac pacemaker    Primary osteoarthritis of both hands    neck and spine and hips   Pulmonary fibrosis (HCC)    followed by pulmonolgy   PVC's (premature ventricular contractions)    Shortness of breath    Sinus arrest 10/2014   s/p Medtronic Advisa model A2DR01 serial number [id number]   Sleep apnea    Stroke (HCC) 01/2014   denies deficits on 10/04/2014   Tachy-brady syndrome (HCC) 10/2014   TIA (transient ischemic attack)    Vitamin D  deficiency    Past Surgical History:  Procedure Laterality Date   CARDIOVERSION N/A 09/02/2017   Procedure: CARDIOVERSION;  Surgeon: Luana Rumple, MD;  Location: [facility];  Service: Cardiovascular;  Laterality: N/A;   COLONOSCOPY  2019   ESOPHAGEAL DILATION     EXCISION VAGINAL CYST     benign nodules   INSERT / REPLACE / REMOVE PACEMAKER  10/04/2014   Medtronic Advisa model A2DR01 serial number  [id number]   LOOP RECORDER EXPLANT N/A 10/04/2014   Procedure: LOOP RECORDER EXPLANT;  Surgeon: Luana Rumple, MD;  Location: [facility];  Service: Cardiovascular;  Laterality: N/A;   LOOP RECORDER IMPLANT N/A 04/19/2014   Procedure: LOOP RECORDER IMPLANT;  Surgeon: Luana Rumple, MD;  Location: [facility];  Service: Cardiovascular;  Laterality: N/A;   NM MYOCAR PERF WALL MOTION  12/18/2007   normal   PERMANENT PACEMAKER INSERTION N/A 10/04/2014   Procedure: PERMANENT PACEMAKER INSERTION;  Surgeon: Luana Rumple, MD;  Location: [facility];  Service: Cardiovascular;  Laterality: N/A;   TUBAL LIGATION      US  ECHOCARDIOGRAPHY  05/15/2010   LA mildly dilated,mild mitral annular ca+, AOV mildly sclerotic, mild asymmetric LVH    reports that [name] [name] quit smoking about 53 years ago. Her smoking use included cigarettes. [name] [name] started smoking about 71 years ago. [name] [name] has a 36 pack-year smoking history. [name] [name] has never used smokeless tobacco. [name] [name] reports current alcohol use. [name] [name] reports that [name] [name] does not use drugs. family history includes Alcoholism in her brother; Alpha-1 antitrypsin deficiency in her daughter; Arthritis in her son; Breast cancer in her daughter; Diabetes in her son; Healthy in her brother, daughter, and sister; Heart attack in her mother; Heart failure in her father; Prostate cancer in her brother, brother, and brother; Stroke in her brother, brother, and father; Sudden death in her mother. Allergies  Allergen Reactions   Cefazolin  Hives and Itching    Other reaction(s): severe hives   Pseudoephedrine Hcl     Other reaction(s): palpitations (moderate)   Ergotamine     unknown   Other Other (See Comments)   Pentobarbital     unknown   Pirfenidone      unknown   Caffeine Palpitations    Review of Systems  Constitutional:  Positive for weight loss. Negative for chills and fever.  Respiratory:  Negative for shortness of breath.   Cardiovascular:  Negative for chest pain.  Genitourinary:  Negative for dysuria.  Neurological:  Positive for tremors and weakness. Negative for speech change, seizures and loss of consciousness.      Objective:     BP 108/64 (BP Location: Left Arm, Patient Position: Sitting, Cuff Size: Large)   Pulse 65   Temp (!) 97.5 F (36.4 C) (Oral)   Wt 243 lb 9.6 oz (110.5 kg)   LMP  (LMP Unknown)   SpO2 95%   BMI 41.81 kg/m  BP Readings from Last 3 Encounters:  04/27/24 108/64  03/30/24 114/72  03/09/24 128/76   Wt Readings from Last 3 Encounters:  04/27/24 243 lb 9.6 oz (110.5 kg)  03/30/24 247 lb 4.8 oz (112.2 kg)  03/09/24 247 lb 9.6 oz (112.3 kg)       Physical Exam Vitals reviewed.  Constitutional:      General: [name] [name] is not in acute distress.    Appearance: [name] [name] is not ill-appearing.  Cardiovascular:     Rate and Rhythm: Normal rate and regular rhythm.  Pulmonary:     Effort: Pulmonary effort is normal.     Breath sounds: Normal breath sounds. No wheezing or rales.  Musculoskeletal:     Right lower leg: No edema.     Left lower leg: No edema.  Neurological:     Mental Status: [name] [name] is alert and oriented to person, place, and time.     Cranial Nerves: No cranial nerve deficit.     Comments: No cogwheel rigidity.  Very fine tremor involving right hand  greater than left. No focal strength deficits      No results found for any visits on 04/27/24.  Last CBC Lab Results  Component Value Date   WBC 9.8 03/30/2024   HGB 14.9 03/30/2024   HCT 45.4 03/30/2024   MCV 86.5 03/30/2024   MCH 27.8 02/09/2024   RDW 14.4 03/30/2024   PLT 256.0 03/30/2024   Last metabolic panel Lab Results  Component Value Date   GLUCOSE 102 (H) 03/04/2024   NA 135 03/04/2024   K 4.2 03/04/2024   CL 102 03/04/2024   CO2 27 03/04/2024   BUN 16 03/04/2024   CREATININE 1.03 03/04/2024   GFR 50.33 (L) 03/04/2024   CALCIUM 8.9 03/04/2024   PROT 7.0 06/10/2023   ALBUMIN 3.2 (L) 06/10/2023   LABGLOB 2.4 07/21/2019   AGRATIO 1.7 07/21/2019   BILITOT 0.6 06/10/2023   ALKPHOS 76 06/10/2023   AST 25 06/10/2023   ALT 18 06/10/2023   ANIONGAP 9 01/14/2024   Last lipids Lab Results  Component Value Date   CHOL 195 07/28/2020   HDL 55 07/28/2020   LDLCALC 115 (H) 07/28/2020   LDLDIRECT 81.0 07/15/2016   TRIG 133 07/28/2020   CHOLHDL 3.5 07/28/2020   Last hemoglobin A1c Lab Results  Component Value Date   HGBA1C 5.9 (H) 07/28/2020   Last thyroid  functions Lab Results  Component Value Date   TSH 3.14 03/09/2024   T3TOTAL 133 09/22/2018      The ASCVD Risk score (Arnett DK, et al., 2019) failed to calculate for the following  reasons:   The 2019 ASCVD risk score is only valid for ages 56 to 66   Risk score cannot be calculated because patient has a medical history suggesting prior/existing ASCVD    Assessment & Plan:   Problem List Items Addressed This Visit   None Visit Diagnoses       Tremor    -  Primary   Relevant Orders   Ambulatory referral to Neurology     [name] [name] presents with constellation of symptoms including some recent possible visual hallucinations, tremors, gait and balance instability, possible autonomic dysfunction, and hyposmia.  [name] [name] has not really seen any decline thus far in memory.  Above constellation of symptoms brings up question of dementia with Lewy bodies  -We did discuss possible MRI brain although [name] [name] does have history of pacemaker and will need to be cleared per cardiology and radiology - Set up neurology referral for further evaluation.  No follow-ups on file.    Glean Lamy, MD

## 2024-04-27 NOTE — Patient Instructions (Signed)
 I will be setting up neurology referral and MRI of brain.

## 2024-04-29 DIAGNOSIS — Z9181 History of falling: Secondary | ICD-10-CM | POA: Diagnosis not present

## 2024-04-29 DIAGNOSIS — M6281 Muscle weakness (generalized): Secondary | ICD-10-CM | POA: Diagnosis not present

## 2024-04-29 DIAGNOSIS — J8410 Pulmonary fibrosis, unspecified: Secondary | ICD-10-CM | POA: Diagnosis not present

## 2024-04-29 DIAGNOSIS — R278 Other lack of coordination: Secondary | ICD-10-CM | POA: Diagnosis not present

## 2024-04-29 DIAGNOSIS — R2681 Unsteadiness on feet: Secondary | ICD-10-CM | POA: Diagnosis not present

## 2024-04-29 DIAGNOSIS — I2510 Atherosclerotic heart disease of native coronary artery without angina pectoris: Secondary | ICD-10-CM | POA: Diagnosis not present

## 2024-04-30 DIAGNOSIS — I951 Orthostatic hypotension: Secondary | ICD-10-CM | POA: Diagnosis not present

## 2024-04-30 DIAGNOSIS — R278 Other lack of coordination: Secondary | ICD-10-CM | POA: Diagnosis not present

## 2024-04-30 DIAGNOSIS — M6281 Muscle weakness (generalized): Secondary | ICD-10-CM | POA: Diagnosis not present

## 2024-05-03 NOTE — Addendum Note (Signed)
 Addended by: Marquetta Sit on: 05/03/2024 08:33 PM   Modules accepted: Orders

## 2024-05-04 DIAGNOSIS — R278 Other lack of coordination: Secondary | ICD-10-CM | POA: Diagnosis not present

## 2024-05-04 DIAGNOSIS — M6281 Muscle weakness (generalized): Secondary | ICD-10-CM | POA: Diagnosis not present

## 2024-05-04 DIAGNOSIS — Z9181 History of falling: Secondary | ICD-10-CM | POA: Diagnosis not present

## 2024-05-04 DIAGNOSIS — R2681 Unsteadiness on feet: Secondary | ICD-10-CM | POA: Diagnosis not present

## 2024-05-05 DIAGNOSIS — G4733 Obstructive sleep apnea (adult) (pediatric): Secondary | ICD-10-CM | POA: Diagnosis not present

## 2024-05-06 DIAGNOSIS — Z9181 History of falling: Secondary | ICD-10-CM | POA: Diagnosis not present

## 2024-05-06 DIAGNOSIS — R2681 Unsteadiness on feet: Secondary | ICD-10-CM | POA: Diagnosis not present

## 2024-05-06 DIAGNOSIS — R278 Other lack of coordination: Secondary | ICD-10-CM | POA: Diagnosis not present

## 2024-05-06 DIAGNOSIS — M6281 Muscle weakness (generalized): Secondary | ICD-10-CM | POA: Diagnosis not present

## 2024-05-07 DIAGNOSIS — I951 Orthostatic hypotension: Secondary | ICD-10-CM | POA: Diagnosis not present

## 2024-05-07 DIAGNOSIS — M6281 Muscle weakness (generalized): Secondary | ICD-10-CM | POA: Diagnosis not present

## 2024-05-07 DIAGNOSIS — R278 Other lack of coordination: Secondary | ICD-10-CM | POA: Diagnosis not present

## 2024-05-11 DIAGNOSIS — Z9181 History of falling: Secondary | ICD-10-CM | POA: Diagnosis not present

## 2024-05-11 DIAGNOSIS — R2681 Unsteadiness on feet: Secondary | ICD-10-CM | POA: Diagnosis not present

## 2024-05-11 DIAGNOSIS — R278 Other lack of coordination: Secondary | ICD-10-CM | POA: Diagnosis not present

## 2024-05-11 DIAGNOSIS — M6281 Muscle weakness (generalized): Secondary | ICD-10-CM | POA: Diagnosis not present

## 2024-05-13 DIAGNOSIS — Z9181 History of falling: Secondary | ICD-10-CM | POA: Diagnosis not present

## 2024-05-13 DIAGNOSIS — R2681 Unsteadiness on feet: Secondary | ICD-10-CM | POA: Diagnosis not present

## 2024-05-13 DIAGNOSIS — R278 Other lack of coordination: Secondary | ICD-10-CM | POA: Diagnosis not present

## 2024-05-13 DIAGNOSIS — M6281 Muscle weakness (generalized): Secondary | ICD-10-CM | POA: Diagnosis not present

## 2024-05-17 DIAGNOSIS — R278 Other lack of coordination: Secondary | ICD-10-CM | POA: Diagnosis not present

## 2024-05-17 DIAGNOSIS — R2681 Unsteadiness on feet: Secondary | ICD-10-CM | POA: Diagnosis not present

## 2024-05-17 DIAGNOSIS — M6281 Muscle weakness (generalized): Secondary | ICD-10-CM | POA: Diagnosis not present

## 2024-05-17 DIAGNOSIS — Z9181 History of falling: Secondary | ICD-10-CM | POA: Diagnosis not present

## 2024-05-19 ENCOUNTER — Ambulatory Visit: Admitting: Family Medicine

## 2024-05-19 ENCOUNTER — Encounter: Payer: Self-pay | Admitting: Family Medicine

## 2024-05-19 ENCOUNTER — Ambulatory Visit: Payer: Self-pay | Admitting: *Deleted

## 2024-05-19 ENCOUNTER — Ambulatory Visit (INDEPENDENT_AMBULATORY_CARE_PROVIDER_SITE_OTHER): Admitting: Family Medicine

## 2024-05-19 VITALS — BP 112/60 | HR 68 | Wt 243.1 lb

## 2024-05-19 DIAGNOSIS — R300 Dysuria: Secondary | ICD-10-CM

## 2024-05-19 DIAGNOSIS — N3000 Acute cystitis without hematuria: Secondary | ICD-10-CM | POA: Diagnosis not present

## 2024-05-19 LAB — POC URINALSYSI DIPSTICK (AUTOMATED)
Bilirubin, UA: NEGATIVE
Blood, UA: POSITIVE
Glucose, UA: NEGATIVE
Ketones, UA: NEGATIVE
Nitrite, UA: POSITIVE
Protein, UA: POSITIVE — AB
Spec Grav, UA: 1.015 (ref 1.010–1.025)
Urobilinogen, UA: 0.2 U/dL
pH, UA: 6.5 (ref 5.0–8.0)

## 2024-05-19 MED ORDER — FOSFOMYCIN TROMETHAMINE 3 G PO PACK: 3.0000 g | PACK | Freq: Once | ORAL | 0 refills | Status: AC

## 2024-05-19 NOTE — Progress Notes (Signed)
 Established Patient Office Visit  Subjective   [name], female    DOB: 12-26-1940  Age: 83 y.o. MRN: [id number]  Chief Complaint  Patient presents with   Dysuria    HPI   [name] [name] is seen with 2 to 3-day history of some intermittent burning with urination.  She states she has had multiple UTIs in the past but none recently.  Denies any gross hematuria.  No flank pain.  No fever.  Keeping down fluids fairly well.  She has previously had episodes where she pushed fluids and took cranberry juice and symptoms resolved.  She does have a reported allergy to Keflex.  She also takes Tikosyn  which limits use of certain antibiotics.  She does have appointment to see neurologist regarding issues she brought up last visit.  MRI brain still pending.  Past Medical History:  Diagnosis Date   Alpha-1-antitrypsin deficiency carrier    Arthritis    all over   Atherosclerosis of aorta (HCC)    Atrial fibrillation (HCC)    Back pain    Complication of anesthesia    I woke up during 2 different procedures (10/04/2014)   Depression    Diastolic dysfunction    Eating disorder    Frequent UTI    GERD (gastroesophageal reflux disease)    Heart disease    Hypertension    Hypertriglyceridemia    Insulin  resistance    Long term current use of anticoagulant    Multiple lung nodules    Muscular deconditioning    MVP (mitral valve prolapse)    Obesity    OSA on CPAP    Osteoarthritis    Osteopenia    unspecified location   Pacemaker    PAF (paroxysmal atrial fibrillation) (HCC)    Positive ANA (antinuclear antibody)    Presbyesophagus    Presence of permanent cardiac pacemaker    Primary osteoarthritis of both hands    neck and spine and hips   Pulmonary fibrosis (HCC)    followed by pulmonolgy   PVC's (premature ventricular contractions)    Shortness of breath    Sinus arrest 10/2014   s/p Medtronic Advisa model A2DR01 serial number [id number]   Sleep apnea     Stroke (HCC) 01/2014   denies deficits on 10/04/2014   Tachy-brady syndrome (HCC) 10/2014   TIA (transient ischemic attack)    Vitamin D  deficiency    Past Surgical History:  Procedure Laterality Date   CARDIOVERSION N/A 09/02/2017   Procedure: CARDIOVERSION;  Surgeon: Luana Rumple, MD;  Location: [facility];  Service: Cardiovascular;  Laterality: N/A;   COLONOSCOPY  2019   ESOPHAGEAL DILATION     EXCISION VAGINAL CYST     benign nodules   INSERT / REPLACE / REMOVE PACEMAKER  10/04/2014   Medtronic Advisa model A2DR01 serial number [id number]   LOOP RECORDER EXPLANT N/A 10/04/2014   Procedure: LOOP RECORDER EXPLANT;  Surgeon: Luana Rumple, MD;  Location: [facility];  Service: Cardiovascular;  Laterality: N/A;   LOOP RECORDER IMPLANT N/A 04/19/2014   Procedure: LOOP RECORDER IMPLANT;  Surgeon: Luana Rumple, MD;  Location: [facility];  Service: Cardiovascular;  Laterality: N/A;   NM MYOCAR PERF WALL MOTION  12/18/2007   normal   PERMANENT PACEMAKER INSERTION N/A 10/04/2014   Procedure: PERMANENT PACEMAKER INSERTION;  Surgeon: Luana Rumple, MD;  Location: [facility];  Service: Cardiovascular;  Laterality: N/A;   TUBAL LIGATION     US  ECHOCARDIOGRAPHY  05/15/2010  LA mildly dilated,mild mitral annular ca+, AOV mildly sclerotic, mild asymmetric LVH    reports that she quit smoking about 53 years ago. Her smoking use included cigarettes. She started smoking about 71 years ago. She has a 36 pack-year smoking history. She has never used smokeless tobacco. She reports current alcohol use. She reports that she does not use drugs. family history includes Alcoholism in her brother; Alpha-1 antitrypsin deficiency in her daughter; Arthritis in her son; Breast cancer in her daughter; Diabetes in her son; Healthy in her brother, daughter, and sister; Heart attack in her mother; Heart failure in her father; Prostate cancer in her brother, brother, and brother; Stroke in her brother, brother, and  father; Sudden death in her mother. Allergies  Allergen Reactions   Cefazolin  Hives and Itching    Other reaction(s): severe hives   Pseudoephedrine Hcl     Other reaction(s): palpitations (moderate)   Ergotamine     unknown   Other Other (See Comments)   Pentobarbital     unknown   Pirfenidone      unknown   Caffeine Palpitations    Review of Systems  Constitutional:  Negative for chills and fever.  Gastrointestinal:  Negative for nausea and vomiting.  Genitourinary:  Positive for dysuria and frequency. Negative for flank pain and hematuria.      Objective:     BP 112/60 (BP Location: Left Arm, Patient Position: Sitting, Cuff Size: Normal)   Pulse 68   Wt 243 lb 1.6 oz (110.3 kg)   LMP  (LMP Unknown)   SpO2 94%   BMI 41.73 kg/m  BP Readings from Last 3 Encounters:  05/19/24 112/60  04/27/24 108/64  03/30/24 114/72   Wt Readings from Last 3 Encounters:  05/19/24 243 lb 1.6 oz (110.3 kg)  04/27/24 243 lb 9.6 oz (110.5 kg)  03/30/24 247 lb 4.8 oz (112.2 kg)      Physical Exam Vitals reviewed.  Constitutional:      General: She is not in acute distress.    Appearance: She is not ill-appearing.   Cardiovascular:     Rate and Rhythm: Normal rate.  Pulmonary:     Effort: Pulmonary effort is normal.   Neurological:     Mental Status: She is alert.      Results for orders placed or performed in visit on 05/19/24  POCT Urinalysis Dipstick (Automated)  Result Value Ref Range   Color, UA Yellow    Clarity, UA Clear    Glucose, UA Negative Negative   Bilirubin, UA Negative    Ketones, UA Negative    Spec Grav, UA 1.015 1.010 - 1.025   Blood, UA Positive    pH, UA 6.5 5.0 - 8.0   Protein, UA Positive (A) Negative   Urobilinogen, UA 0.2 0.2 or 1.0 E.U./dL   Nitrite, UA Positive    Leukocytes, UA Large (3+) (A) Negative      The ASCVD Risk score (Arnett DK, et al., 2019) failed to calculate for the following reasons:   The 2019 ASCVD risk score is  only valid for ages 69 to 30   Risk score cannot be calculated because patient has a medical history suggesting prior/existing ASCVD    Assessment & Plan:   Problem List Items Addressed This Visit   None Visit Diagnoses       Dysuria    -  Primary   Relevant Orders   POCT Urinalysis Dipstick (Automated) (Completed)   Urine Culture  Urine dipstick strongly suggests UTI with positive blood, nitrites, and leukocytes.  Patient is nontoxic in appearance.  We considered several antibiotic possibilities.  She is allergic to Keflex/cefazolin .  She is on Tikosyn  and should not use Cipro  or Septra with that.  She has history of pulmonary fibrosis and Macrobid  is not ideal.  Will try to get fosfomycin 3 g p.o. x 1 dose.  Push fluids.  Follow-up for any persistent symptoms.  No follow-ups on file.    Glean Lamy, MD

## 2024-05-19 NOTE — Telephone Encounter (Signed)
 FYI Only or Action Required?: Action required by provider  Patient was last seen in primary care on 04/27/2024 by [name] Sit, MD. Called Nurse Triage reporting Dysuria. Symptoms began several days ago. Interventions attempted: Nothing. Symptoms are: gradually worsening.  Triage Disposition: See Physician Within 24 Hours  Patient/caregiver understands and will follow disposition?: No, wishes to speak with PCP                    Copied from CRM [id number]. Topic: Clinical - Red Word Triage >> May 19, 2024  9:26 AM [name] [name] E wrote: [facility] that prompted transfer to Nurse Triage: Possible bladder infection. Patient experiencing burning while urinating and vaginal pressure. Symptoms going on for 4-5 days on and off. Reason for Disposition  Age > 50 years  Answer Assessment - Initial Assessment Questions 1. SEVERITY: How bad is the pain?  (e.g., Scale 1-10; mild, moderate, or severe)   - MILD (1-3): complains slightly about urination hurting   - MODERATE (4-7): interferes with normal activities     - SEVERE (8-10): excruciating, unwilling or unable to urinate because of the pain      Denies pain but reports burning  2. FREQUENCY: How many times have you had painful urination today?      Denies painful urination but reports burning  3. PATTERN: Is pain present every time you urinate or just sometimes?      Present even without urinating  4. ONSET: When did the painful urination start?      4-5 days ago  5. FEVER: Do you have a fever? If Yes, ask: What is your temperature, how was it measured, and when did it start?     no 6. PAST UTI: Have you had a urine infection before? If Yes, ask: When was the last time? and What happened that time?      Yes  7. CAUSE: What do you think is causing the painful urination?  (e.g., UTI, scratch, Herpes sore)     UTI 8. OTHER SYMPTOMS: Do you have any other symptoms? (e.g., blood in urine, flank pain, genital  sores, urgency, vaginal discharge)     Burning with urination , vaginal pressure  9. PREGNANCY: Is there any chance you are pregnant? When was your last menstrual period?     Na  Offered appt and patient requesting to please dont make me come in I have these all the time. Please advise. Patient requesting medication but does not want to come in for evaluation. Recommended cranberry juice and to monitor intake due to hx CHF.  Protocols used: Urination Pain - Female-A-AH

## 2024-05-19 NOTE — Telephone Encounter (Signed)
 Patient has been scheduled to see PCP to address

## 2024-05-20 DIAGNOSIS — R300 Dysuria: Secondary | ICD-10-CM | POA: Diagnosis not present

## 2024-05-22 LAB — URINE CULTURE
MICRO NUMBER:: 16601183
SPECIMEN QUALITY:: ADEQUATE

## 2024-05-23 ENCOUNTER — Ambulatory Visit: Payer: Self-pay | Admitting: Family Medicine

## 2024-05-24 DIAGNOSIS — Z9181 History of falling: Secondary | ICD-10-CM | POA: Diagnosis not present

## 2024-05-24 DIAGNOSIS — M6281 Muscle weakness (generalized): Secondary | ICD-10-CM | POA: Diagnosis not present

## 2024-05-24 DIAGNOSIS — R2681 Unsteadiness on feet: Secondary | ICD-10-CM | POA: Diagnosis not present

## 2024-05-24 DIAGNOSIS — R278 Other lack of coordination: Secondary | ICD-10-CM | POA: Diagnosis not present

## 2024-05-26 DIAGNOSIS — M6281 Muscle weakness (generalized): Secondary | ICD-10-CM | POA: Diagnosis not present

## 2024-05-26 DIAGNOSIS — R2681 Unsteadiness on feet: Secondary | ICD-10-CM | POA: Diagnosis not present

## 2024-05-26 DIAGNOSIS — Z9181 History of falling: Secondary | ICD-10-CM | POA: Diagnosis not present

## 2024-05-26 DIAGNOSIS — R278 Other lack of coordination: Secondary | ICD-10-CM | POA: Diagnosis not present

## 2024-05-30 DIAGNOSIS — I2510 Atherosclerotic heart disease of native coronary artery without angina pectoris: Secondary | ICD-10-CM | POA: Diagnosis not present

## 2024-05-30 DIAGNOSIS — J8410 Pulmonary fibrosis, unspecified: Secondary | ICD-10-CM | POA: Diagnosis not present

## 2024-05-31 DIAGNOSIS — M6281 Muscle weakness (generalized): Secondary | ICD-10-CM | POA: Diagnosis not present

## 2024-05-31 DIAGNOSIS — R2681 Unsteadiness on feet: Secondary | ICD-10-CM | POA: Diagnosis not present

## 2024-05-31 DIAGNOSIS — Z9181 History of falling: Secondary | ICD-10-CM | POA: Diagnosis not present

## 2024-05-31 DIAGNOSIS — R278 Other lack of coordination: Secondary | ICD-10-CM | POA: Diagnosis not present

## 2024-06-01 DIAGNOSIS — R278 Other lack of coordination: Secondary | ICD-10-CM | POA: Diagnosis not present

## 2024-06-01 DIAGNOSIS — R2681 Unsteadiness on feet: Secondary | ICD-10-CM | POA: Diagnosis not present

## 2024-06-01 DIAGNOSIS — M6281 Muscle weakness (generalized): Secondary | ICD-10-CM | POA: Diagnosis not present

## 2024-06-01 DIAGNOSIS — Z9181 History of falling: Secondary | ICD-10-CM | POA: Diagnosis not present

## 2024-06-04 DIAGNOSIS — G4733 Obstructive sleep apnea (adult) (pediatric): Secondary | ICD-10-CM | POA: Diagnosis not present

## 2024-06-08 ENCOUNTER — Other Ambulatory Visit: Payer: Self-pay | Admitting: Cardiovascular Disease

## 2024-06-08 DIAGNOSIS — Z9181 History of falling: Secondary | ICD-10-CM | POA: Diagnosis not present

## 2024-06-08 DIAGNOSIS — R278 Other lack of coordination: Secondary | ICD-10-CM | POA: Diagnosis not present

## 2024-06-08 DIAGNOSIS — I480 Paroxysmal atrial fibrillation: Secondary | ICD-10-CM

## 2024-06-08 DIAGNOSIS — M6281 Muscle weakness (generalized): Secondary | ICD-10-CM | POA: Diagnosis not present

## 2024-06-08 DIAGNOSIS — R2681 Unsteadiness on feet: Secondary | ICD-10-CM | POA: Diagnosis not present

## 2024-06-08 NOTE — Telephone Encounter (Signed)
 Refill Request.

## 2024-06-08 NOTE — Telephone Encounter (Signed)
 Prescription refill request for Eliquis  received. Indication: PAF Last office visit: 01/14/24  C Fenton PA Scr: 1.03 on 03/04/24  Epic Age: 83 Weight: 114.8kg  Based on above findings Eliquis  5mg  twice daily is the appropriate dose.  Refill approved.

## 2024-06-10 DIAGNOSIS — Z9181 History of falling: Secondary | ICD-10-CM | POA: Diagnosis not present

## 2024-06-10 DIAGNOSIS — R278 Other lack of coordination: Secondary | ICD-10-CM | POA: Diagnosis not present

## 2024-06-10 DIAGNOSIS — M6281 Muscle weakness (generalized): Secondary | ICD-10-CM | POA: Diagnosis not present

## 2024-06-10 DIAGNOSIS — R2681 Unsteadiness on feet: Secondary | ICD-10-CM | POA: Diagnosis not present

## 2024-06-14 ENCOUNTER — Telehealth: Payer: Self-pay

## 2024-06-14 DIAGNOSIS — R2689 Other abnormalities of gait and mobility: Secondary | ICD-10-CM

## 2024-06-14 DIAGNOSIS — R251 Tremor, unspecified: Secondary | ICD-10-CM

## 2024-06-14 DIAGNOSIS — R4182 Altered mental status, unspecified: Secondary | ICD-10-CM

## 2024-06-14 DIAGNOSIS — Z9181 History of falling: Secondary | ICD-10-CM | POA: Diagnosis not present

## 2024-06-14 DIAGNOSIS — R2681 Unsteadiness on feet: Secondary | ICD-10-CM | POA: Diagnosis not present

## 2024-06-14 DIAGNOSIS — M6281 Muscle weakness (generalized): Secondary | ICD-10-CM | POA: Diagnosis not present

## 2024-06-14 DIAGNOSIS — R438 Other disturbances of smell and taste: Secondary | ICD-10-CM

## 2024-06-14 DIAGNOSIS — R278 Other lack of coordination: Secondary | ICD-10-CM | POA: Diagnosis not present

## 2024-06-14 NOTE — Telephone Encounter (Signed)
 Copied from CRM [id number]. Topic: Clinical - Medical Advice >> Jun 14, 2024  2:56 PM [place] wrote: Reason for CRM: patient has a question about an MRI order that was supposed to be put in and no one has contacted her about it

## 2024-06-14 NOTE — Telephone Encounter (Signed)
 I spoke with the referral coordinator and she reported that patient has not yet been scheduled due to having pacemaker. Referral coordinator inquired if imaging location can be changed to [facility] or [place] for scheduling asap.

## 2024-06-16 ENCOUNTER — Telehealth: Payer: Self-pay

## 2024-06-16 ENCOUNTER — Ambulatory Visit: Payer: Self-pay

## 2024-06-16 DIAGNOSIS — M6281 Muscle weakness (generalized): Secondary | ICD-10-CM | POA: Diagnosis not present

## 2024-06-16 DIAGNOSIS — Z9181 History of falling: Secondary | ICD-10-CM | POA: Diagnosis not present

## 2024-06-16 DIAGNOSIS — R2681 Unsteadiness on feet: Secondary | ICD-10-CM | POA: Diagnosis not present

## 2024-06-16 DIAGNOSIS — R278 Other lack of coordination: Secondary | ICD-10-CM | POA: Diagnosis not present

## 2024-06-16 NOTE — Telephone Encounter (Signed)
 FYI Only or Action Required?: FYI only for provider.  Patient was last seen in primary care on 05/19/2024 by Micheal Wolm ORN, MD.  Called Nurse Triage reporting No chief complaint on file..  Symptoms began several days ago.  Interventions attempted: Nothing.  Symptoms are: gradually worsening.  Triage Disposition: Duplicate Contact Calls  Patient/caregiver understands and will follow disposition?: Yes   Reason for Disposition  Caller has already spoken with another triager or doctor (or NP/PA, pharmacist) AND has further questions AND triager able to answer questions.  Protocols used: No Contact or Duplicate Contact Call-A-AH

## 2024-06-16 NOTE — Telephone Encounter (Signed)
 Copied from CRM [id number]. Topic: General - Other >> Jun 16, 2024 10:27 AM Aleatha C wrote: Reason for CRM: Patient wanted to know if she can get some nose spray order and she received a letter from [facility] that they will not cover the visit and she wants to know who needs to speak to regarding the issue

## 2024-06-16 NOTE — Telephone Encounter (Signed)
 Patient informed to contact [facility] billing regarding previous visit.

## 2024-06-17 ENCOUNTER — Telehealth: Payer: Self-pay | Admitting: *Deleted

## 2024-06-17 NOTE — Telephone Encounter (Signed)
 Left a message for the patient to return my call.

## 2024-06-17 NOTE — Telephone Encounter (Signed)
 Copied from CRM [id number]. Topic: General - Other >> Jun 17, 2024 10:30 AM [name] [name] wrote: Reason for CRM: Patient called in stating that she received a call from CMA. There was no documentation of someone trying to contact her.

## 2024-06-18 ENCOUNTER — Telehealth: Payer: Self-pay

## 2024-06-18 MED ORDER — FLUTICASONE PROPIONATE 50 MCG/ACT NA SUSP: 1.0000 | Freq: Every day | NASAL | 2 refills | Status: AC

## 2024-06-18 NOTE — Telephone Encounter (Signed)
 No attempt was made to contact patient at this time

## 2024-06-18 NOTE — Addendum Note (Signed)
 Addended by: METTA KRISTEN CROME on: 06/18/2024 08:18 AM   Modules accepted: Orders

## 2024-06-18 NOTE — Telephone Encounter (Signed)
 Please see previous encounter

## 2024-06-18 NOTE — Telephone Encounter (Signed)
 Copied from CRM [id number]. Topic: General - Other >> Jun 17, 2024 10:30 AM Burnard DEL wrote: Reason for CRM: Patient called in stating that she received a call from CMA. There was no documentation of someone trying to contact her.

## 2024-06-18 NOTE — Telephone Encounter (Signed)
 Rx done.

## 2024-06-21 ENCOUNTER — Telehealth: Payer: Self-pay

## 2024-06-21 DIAGNOSIS — Z9181 History of falling: Secondary | ICD-10-CM | POA: Diagnosis not present

## 2024-06-21 DIAGNOSIS — M6281 Muscle weakness (generalized): Secondary | ICD-10-CM | POA: Diagnosis not present

## 2024-06-21 DIAGNOSIS — R2681 Unsteadiness on feet: Secondary | ICD-10-CM | POA: Diagnosis not present

## 2024-06-21 DIAGNOSIS — R278 Other lack of coordination: Secondary | ICD-10-CM | POA: Diagnosis not present

## 2024-06-21 NOTE — Telephone Encounter (Signed)
 I spoke with the patient and she reported she is having a productive cough with green sputum ,x1 month. Appt scheduled for tomorrow to discuss

## 2024-06-21 NOTE — Telephone Encounter (Signed)
 Please see previous note.

## 2024-06-21 NOTE — Telephone Encounter (Signed)
 Copied from CRM [id number]. Topic: General - Call Back - No Documentation >> Jun 17, 2024  5:02 PM Chiquita SQUIBB wrote: Reason for CRM: Patient is calling [place] back, please try the patient again tomorrow as the office has closed when the patient called.

## 2024-06-22 ENCOUNTER — Ambulatory Visit (INDEPENDENT_AMBULATORY_CARE_PROVIDER_SITE_OTHER): Admitting: Family Medicine

## 2024-06-22 ENCOUNTER — Encounter: Payer: Self-pay | Admitting: Family Medicine

## 2024-06-22 VITALS — BP 124/60 | HR 70 | Temp 97.6°F | Wt 242.3 lb

## 2024-06-22 DIAGNOSIS — J0100 Acute maxillary sinusitis, unspecified: Secondary | ICD-10-CM | POA: Diagnosis not present

## 2024-06-22 DIAGNOSIS — R1310 Dysphagia, unspecified: Secondary | ICD-10-CM | POA: Diagnosis not present

## 2024-06-22 DIAGNOSIS — R251 Tremor, unspecified: Secondary | ICD-10-CM | POA: Diagnosis not present

## 2024-06-22 NOTE — Progress Notes (Signed)
 Established Patient Office Visit  Subjective   [name], female    DOB: 09-Dec-1940  Age: 83 y.o. MRN: [id number]  Chief Complaint  Patient presents with   Sinusitis    HPI   [name] [name] is seen today to discuss multiple issues as follows.  She has multiple chronic problems including history of atrial fibrillation, history of TIA, chronic respiratory failure with hypoxia, obstructive sleep apnea, GERD, dyslipidemia.  She has had some recent issues with dysphagia and saw GI and they feel like she was not a candidate for EGD.  She is considering getting another GI opinion.  She particularly has increased coughing at night when she is trying to eat and also has noted increased coughing when she uses CPAP.  She has follow-up with her pulmonary specialist next week  Seen today with sinusitis symptoms for almost a month.  She describes greenish nasal discharge.  Some facial pain maxillary region and occasional headaches.  No reported fever.  She states symptoms are actually slightly better today than they have been.  Using some type of over-the-counter nasal saline spray.  As per previous note, she has had constellation of issues including occasional visual hallucinations, increased tremor, gait instability and falls, blood pressure instability with some orthostasis, hyposmia.  We set up neurology referral but she does not see them till September 30.  We ordered MRI brain but they declined doing this because of concern about her pacemaker.  However, we got verbal clearance from her cardiologist that she should be able to get MRI with her type of pacemaker.  She does have follow-up with her cardiologist later this week  Past Medical History:  Diagnosis Date   Alpha-1-antitrypsin deficiency carrier    Arthritis    all over   Atherosclerosis of aorta (HCC)    Atrial fibrillation (HCC)    Back pain    Complication of anesthesia    I woke up during 2 different procedures  (10/04/2014)   Depression    Diastolic dysfunction    Eating disorder    Frequent UTI    GERD (gastroesophageal reflux disease)    Heart disease    Hypertension    Hypertriglyceridemia    Insulin  resistance    Long term current use of anticoagulant    Multiple lung nodules    Muscular deconditioning    MVP (mitral valve prolapse)    Obesity    OSA on CPAP    Osteoarthritis    Osteopenia    unspecified location   Pacemaker    PAF (paroxysmal atrial fibrillation) (HCC)    Positive ANA (antinuclear antibody)    Presbyesophagus    Presence of permanent cardiac pacemaker    Primary osteoarthritis of both hands    neck and spine and hips   Pulmonary fibrosis (HCC)    followed by pulmonolgy   PVC's (premature ventricular contractions)    Shortness of breath    Sinus arrest 10/2014   s/p Medtronic Advisa model A2DR01 serial number [id number]   Sleep apnea    Stroke (HCC) 01/2014   denies deficits on 10/04/2014   Tachy-brady syndrome (HCC) 10/2014   TIA (transient ischemic attack)    Vitamin D  deficiency    Past Surgical History:  Procedure Laterality Date   CARDIOVERSION N/A 09/02/2017   Procedure: CARDIOVERSION;  Surgeon: Francyne Headland, MD;  Location: [facility];  Service: Cardiovascular;  Laterality: N/A;   COLONOSCOPY  2019   ESOPHAGEAL DILATION     EXCISION  VAGINAL CYST     benign nodules   INSERT / REPLACE / REMOVE PACEMAKER  10/04/2014   Medtronic Advisa model A2DR01 serial number [id number]   LOOP RECORDER EXPLANT N/A 10/04/2014   Procedure: LOOP RECORDER EXPLANT;  Surgeon: Jerel Balding, MD;  Location: [facility];  Service: Cardiovascular;  Laterality: N/A;   LOOP RECORDER IMPLANT N/A 04/19/2014   Procedure: LOOP RECORDER IMPLANT;  Surgeon: Jerel Balding, MD;  Location: [facility];  Service: Cardiovascular;  Laterality: N/A;   NM MYOCAR PERF WALL MOTION  12/18/2007   normal   PERMANENT PACEMAKER INSERTION N/A 10/04/2014   Procedure: PERMANENT PACEMAKER  INSERTION;  Surgeon: Jerel Balding, MD;  Location: [facility];  Service: Cardiovascular;  Laterality: N/A;   TUBAL LIGATION     US  ECHOCARDIOGRAPHY  05/15/2010   LA mildly dilated,mild mitral annular ca+, AOV mildly sclerotic, mild asymmetric LVH    reports that she quit smoking about 53 years ago. Her smoking use included cigarettes. She started smoking about 71 years ago. She has a 36 pack-year smoking history. She has never used smokeless tobacco. She reports current alcohol use. She reports that she does not use drugs. family history includes Alcoholism in her brother; Alpha-1 antitrypsin deficiency in her daughter; Arthritis in her son; Breast cancer in her daughter; Diabetes in her son; Healthy in her brother, daughter, and sister; Heart attack in her mother; Heart failure in her father; Prostate cancer in her brother, brother, and brother; Stroke in her brother, brother, and father; Sudden death in her mother. Allergies  Allergen Reactions   Cefazolin  Hives and Itching    Other reaction(s): severe hives   Pseudoephedrine Hcl     Other reaction(s): palpitations (moderate)   Ergotamine     unknown   Other Other (See Comments)   Pentobarbital     unknown   Pirfenidone      unknown   Caffeine Palpitations    Review of Systems  Constitutional:  Negative for chills and fever.  HENT:  Positive for congestion and sinus pain. Negative for sore throat.   Respiratory:  Positive for cough.   Cardiovascular:  Negative for chest pain.      Objective:     BP 124/60 (BP Location: Left Arm, Patient Position: Sitting, Cuff Size: Large)   Pulse 70   Temp 97.6 F (36.4 C) (Oral)   Wt 242 lb 4.8 oz (109.9 kg)   LMP  (LMP Unknown)   SpO2 96%   BMI 41.59 kg/m  BP Readings from Last 3 Encounters:  06/22/24 124/60  05/19/24 112/60  04/27/24 108/64   Wt Readings from Last 3 Encounters:  06/22/24 242 lb 4.8 oz (109.9 kg)  05/19/24 243 lb 1.6 oz (110.3 kg)  04/27/24 243 lb 9.6 oz (110.5  kg)      Physical Exam Vitals reviewed.  Constitutional:      General: She is not in acute distress.    Appearance: She is not ill-appearing.  HENT:     Mouth/Throat:     Mouth: Mucous membranes are moist.     Pharynx: Oropharynx is clear. No posterior oropharyngeal erythema.  Cardiovascular:     Rate and Rhythm: Normal rate.  Pulmonary:     Effort: Pulmonary effort is normal.     Breath sounds: Normal breath sounds.  Neurological:     Mental Status: She is alert.  Psychiatric:        Mood and Affect: Mood normal.      No results  found for any visits on 06/22/24.  Last CBC Lab Results  Component Value Date   WBC 9.8 03/30/2024   HGB 14.9 03/30/2024   HCT 45.4 03/30/2024   MCV 86.5 03/30/2024   MCH 27.8 02/09/2024   RDW 14.4 03/30/2024   PLT 256.0 03/30/2024   Last metabolic panel Lab Results  Component Value Date   GLUCOSE 102 (H) 03/04/2024   NA 135 03/04/2024   K 4.2 03/04/2024   CL 102 03/04/2024   CO2 27 03/04/2024   BUN 16 03/04/2024   CREATININE 1.03 03/04/2024   GFR 50.33 (L) 03/04/2024   CALCIUM 8.9 03/04/2024   PROT 7.0 06/10/2023   ALBUMIN 3.2 (L) 06/10/2023   LABGLOB 2.4 07/21/2019   AGRATIO 1.7 07/21/2019   BILITOT 0.6 06/10/2023   ALKPHOS 76 06/10/2023   AST 25 06/10/2023   ALT 18 06/10/2023   ANIONGAP 9 01/14/2024   Last hemoglobin A1c Lab Results  Component Value Date   HGBA1C 5.9 (H) 07/28/2020   Last thyroid  functions Lab Results  Component Value Date   TSH 3.14 03/09/2024   T3TOTAL 133 09/22/2018   Last vitamin B12 and Folate No results found for: VITAMINB12, FOLATE    The ASCVD Risk score (Arnett DK, et al., 2019) failed to calculate for the following reasons:   The 2019 ASCVD risk score is only valid for ages 19 to 51   Risk score cannot be calculated because patient has a medical history suggesting prior/existing ASCVD    Assessment & Plan:   #1 acute maxillary sinusitis symptoms.  These been going on for  several weeks.  Patient nontoxic in appearance.  We did discuss possible antibiotic use but she states her symptoms are somewhat improved today and she like to get this couple days of observation.  Stay well-hydrated.  Consider plain Mucinex.  Follow-up immediately for any fever or worsening symptoms  #2 dysphagia.  Patient had barium esophagram back in 2023 which showed posterior cervical esophageal web which was nonobstructive to thin barium or tablet.  She consulted with GI recently and she states they declined EGD because of her risk status.  She is considering another consultation at [facility] or [facility] for second opinion.  #3 tremor, hyposmia, orthostasis, balance issues, visual hallucinations.  Concern for possible Lewy body dementia.  MRI brain pending if we can get this scheduled.  She has pending follow-up with neurology in September.  Wolm Scarlet, MD

## 2024-06-23 DIAGNOSIS — Z9181 History of falling: Secondary | ICD-10-CM | POA: Diagnosis not present

## 2024-06-23 DIAGNOSIS — R278 Other lack of coordination: Secondary | ICD-10-CM | POA: Diagnosis not present

## 2024-06-23 DIAGNOSIS — M6281 Muscle weakness (generalized): Secondary | ICD-10-CM | POA: Diagnosis not present

## 2024-06-23 DIAGNOSIS — R2681 Unsteadiness on feet: Secondary | ICD-10-CM | POA: Diagnosis not present

## 2024-06-23 NOTE — Addendum Note (Signed)
 Addended by: METTA KRISTEN CROME on: 06/23/2024 10:16 AM   Modules accepted: Orders

## 2024-06-23 NOTE — Addendum Note (Signed)
 Addended by: MICHEAL WOLM ORN on: 06/23/2024 12:27 PM   Modules accepted: Orders

## 2024-06-24 ENCOUNTER — Ambulatory Visit: Attending: Cardiovascular Disease | Admitting: Cardiovascular Disease

## 2024-06-24 ENCOUNTER — Encounter: Payer: Self-pay | Admitting: Cardiovascular Disease

## 2024-06-24 VITALS — BP 112/80 | HR 82 | Ht 67.0 in | Wt 241.8 lb

## 2024-06-24 DIAGNOSIS — R1310 Dysphagia, unspecified: Secondary | ICD-10-CM

## 2024-06-24 DIAGNOSIS — I495 Sick sinus syndrome: Secondary | ICD-10-CM | POA: Diagnosis not present

## 2024-06-24 DIAGNOSIS — Z5181 Encounter for therapeutic drug level monitoring: Secondary | ICD-10-CM

## 2024-06-24 DIAGNOSIS — J84112 Idiopathic pulmonary fibrosis: Secondary | ICD-10-CM

## 2024-06-24 DIAGNOSIS — I480 Paroxysmal atrial fibrillation: Secondary | ICD-10-CM | POA: Diagnosis not present

## 2024-06-24 DIAGNOSIS — D6869 Other thrombophilia: Secondary | ICD-10-CM

## 2024-06-24 DIAGNOSIS — Z79899 Other long term (current) drug therapy: Secondary | ICD-10-CM

## 2024-06-24 DIAGNOSIS — G4733 Obstructive sleep apnea (adult) (pediatric): Secondary | ICD-10-CM

## 2024-06-24 DIAGNOSIS — I2721 Secondary pulmonary arterial hypertension: Secondary | ICD-10-CM

## 2024-06-24 DIAGNOSIS — I5032 Chronic diastolic (congestive) heart failure: Secondary | ICD-10-CM

## 2024-06-24 DIAGNOSIS — Z950 Presence of cardiac pacemaker: Secondary | ICD-10-CM

## 2024-06-24 MED ORDER — NITROGLYCERIN 0.4 MG SL SUBL: 0.4000 mg | SUBLINGUAL_TABLET | SUBLINGUAL | 1 refills | Status: AC | PRN

## 2024-06-24 NOTE — Progress Notes (Signed)
 Cardiology Office Note    Date:  06/29/2024   ID:  [name] [name], DOB [birth year], MRN [id number]  PCP:  [name] [name] ORN, MD  Cardiologist:   [name] Balding, MD   Chief Complaint  Patient presents with   Atrial Fibrillation     History of Present Illness:  [name] [name] is a 83 y.o. female with tachycardia-bradycardia syndrome (sinus node dysfunction and paroxysmal atrial fibrillation, dual-chamber Medtronic pacemaker implanted 2015, MRI conditional system), chronic diastolic heart failure, mitral valve prolapse, obstructive sleep apnea, idiopathic pulmonary fibrosis, history of stroke, atherosclerosis of the aorta, prediabetes and morbid obesity.  From a cardiac standpoint she has done well since her last appointment.  She has not had problems with chest pain or worsening shortness of breath.  She continues to have NYHA functional class II-III dyspnea but has no trouble with personal care activities.  She denies palpitations, dizziness or syncope.  She has not had lower extremity edema (other than intermittent mild ankle swelling at the end of the day, resolved by the next morning), orthopnea or PND.  She is not taking any diuretics.  She is not requiring home O2.  Pacemaker interrogation shows that her Medtronic Advisa (5076 leads, MRI conditional device implanted in 2015) still has roughly 13 months of estimated longevity.  She has had almost completely normal rhythm.  She has 97% atrial pacing and only 0.4% ventricular pacing with good heart rate histogram and no episodes of high ventricular rates.  All lead parameters are excellent.  She's only had a total of 39 seconds of atrial mode switch in the last 24 months, no evidence of any atrial fibrillation.  On her most recent ECG the QTc was completely normal at 424 ms.  She had normal renal function and potassium levels on labs performed 03/04/2024.  She has interstitial lung disease (CT appearance consistent with usual interstitial  pneumonia) and has a recent high-resolution CT that showed slight interval worsening.  She is on treatment with Ofev  and is followed by Dr. Geronimo.  In the past, she had a barium swallow that showed a stricture at the gastroesophageal junction where the 13 mm barium tablet could not pass.  She had an esophageal dilation with Dr. Luis in 2020.  She is having recurring problems with swallowing but the gastroenterologist that she saw at [facility] thought that performing another EGD would be too risky.  She would like another opinion.  Her primary care provider has raised the possibility that she may have Lewy body disease since she has swallowing problems tremor and gait issues.  Cognitive deficits however are not particularly prominent during the interview today.  She is scheduled to have a MRI of the brain 07/20/2024.   She has evidence of atherosclerosis of the aorta. She does not have angina pectoris. Nuclear stress test in January 2018 was normal/low risk. Her echocardiogram in April 2015 showed evidence of diastolic dysfunction without elevated filling pressures. Similar findings on echo in September 2017 with EF 50-55% and grade 1 diastolic dysfunction without elevated filling pressures. She has not had clinically evident congestive heart failure in the past. 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  Since starting dofetilide  her burden of atrial fibrillation has been extremely low and she has not had any evidence of proarrhythmia.  She sees Dr. Neysa for lung fibrosis (radiologically  nonspecific  interstitial pneumonitis or early/mild usual interstitial pneumonitis, mildly positive ANA at 1:160, but felt not to have lupus after rheumatology consultation). She has a long-standing history of obstructive sleep apnea but uses CPAP. 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  She has a lifelong struggle with obesity and has prediabetes.    Past Medical History:  Diagnosis Date   Alpha-1-antitrypsin deficiency carrier    Arthritis    all over   Atherosclerosis of aorta (HCC)    Atrial fibrillation (HCC)    Back pain    Complication of anesthesia    I woke up during 2 different procedures (10/04/2014)   Depression    Diastolic dysfunction    Eating disorder    Frequent UTI    GERD (gastroesophageal reflux disease)    Heart disease    Hypertension    Hypertriglyceridemia    Insulin  resistance    Long term current use of anticoagulant    Multiple lung nodules    Muscular deconditioning    MVP (mitral valve prolapse)    Obesity    OSA on CPAP    Osteoarthritis    Osteopenia    unspecified location   Pacemaker    PAF (paroxysmal atrial fibrillation) (HCC)    Positive ANA (antinuclear antibody)    Presbyesophagus    Presence of permanent cardiac pacemaker    Primary osteoarthritis of both hands    neck and spine and hips   Pulmonary fibrosis (HCC)    followed by pulmonolgy   PVC's (premature ventricular contractions)    Shortness of breath    Sinus arrest October 06, 2014   s/p Medtronic Advisa model A2DR01 serial number [id number]   Sleep apnea    Stroke (HCC) 01/2014   denies deficits on 10/04/2014   Tachy-brady syndrome (HCC) 10-06-2014   TIA (transient ischemic attack)    Vitamin D  deficiency     Past Surgical History:  Procedure Laterality Date   CARDIOVERSION N/A 09/02/2017   Procedure: CARDIOVERSION;  Surgeon: Francyne Headland, MD;  Location: [facility];  Service: Cardiovascular;  Laterality: N/A;   COLONOSCOPY  2019   ESOPHAGEAL DILATION     EXCISION VAGINAL CYST     benign nodules   INSERT / REPLACE / REMOVE PACEMAKER  10/04/2014   Medtronic Advisa model A2DR01 serial number [id number]   LOOP RECORDER EXPLANT N/A 10/04/2014    Procedure: LOOP RECORDER EXPLANT;  Surgeon: Headland Francyne, MD;  Location: [facility] CATH LAB;  Service: Cardiovascular;  Laterality: N/A;   LOOP RECORDER IMPLANT N/A 04/19/2014   Procedure: LOOP RECORDER IMPLANT;  Surgeon: Headland Francyne, MD;  Location: [facility] CATH [facility];  Service: Cardiovascular;  Laterality: N/A;   NM MYOCAR PERF WALL MOTION  12/18/2007   normal   PERMANENT PACEMAKER INSERTION N/A 10/04/2014   Procedure: PERMANENT PACEMAKER INSERTION;  Surgeon: Headland Francyne, MD;  Location: [facility];  Service: Cardiovascular;  Laterality: N/A;   TUBAL LIGATION     US  ECHOCARDIOGRAPHY  05/15/2010   LA mildly dilated,mild mitral annular ca+, AOV mildly sclerotic, mild asymmetric LVH    Current Medications: Outpatient Medications Prior to Visit  Medication Sig Dispense Refill   Cholecalciferol (VITAMIN D3 PO) Take 1 tablet by mouth daily.     diazepam  (VALIUM ) 5 MG tablet TAKE 1 TABLET ONCE A DAY AS NEEDED. 30 tablet 1   dofetilide  (TIKOSYN ) 500 MCG capsule Take 1 capsule (500 mcg total) by  mouth 2 (two) times daily. 180 capsule 1   ELIQUIS  5 MG TABS tablet TAKE ONE TABLET BY MOUTH TWICE DAILY 180 tablet 1   fluticasone  (FLONASE ) 50 MCG/ACT nasal spray 1 to 2 sprays per nostril once daily as needed for nasal congestion 16 g 0   fluticasone  (FLONASE ) 50 MCG/ACT nasal spray Place 1 spray into both nostrils daily. 16 g 2   ipratropium (ATROVENT ) 0.06 % nasal spray Place 2 sprays into both nostrils 4 (four) times daily as needed for rhinitis. 15 mL 12   Magnesium  400 MG CAPS Take 400 mg by mouth daily. 30 capsule 0   metoprolol  tartrate (LOPRESSOR ) 25 MG tablet Take 1/2 tablets (12.5 mg total) by mouth 2 (two) times daily. 90 tablet 2   nystatin  cream (MYCOSTATIN ) Apply to affected rash twice a day as needed 30 g 0   pantoprazole  (PROTONIX ) 40 MG tablet Take 1 tablet (40 mg total) by mouth 2 (two) times daily. 180 tablet 3   sertraline  (ZOLOFT ) 100 MG tablet TAKE ONE TABLET BY MOUTH ONCE DAILY. 90 tablet 3    nitroGLYCERIN  (NITROSTAT ) 0.4 MG SL tablet Place 1 tablet (0.4 mg total) under the tongue every 5 (five) minutes as needed for chest pain. 25 tablet 1   No facility-administered medications prior to visit.     Allergies:   Cefazolin , Pseudoephedrine hcl, Ergotamine, Other, Pentobarbital, Pirfenidone , and Caffeine   Family History:  The patient's family history includes Alcoholism in her brother; Alpha-1 antitrypsin deficiency in her daughter; Arthritis in her son; Breast cancer in her daughter; Diabetes in her son; Healthy in her brother, daughter, and sister; Heart attack in her mother; Heart failure in her father; Prostate cancer in her brother, brother, and brother; Stroke in her brother, brother, and father; Sudden death in her mother.   ROS:   Please see the history of present illness.    All other systems are reviewed and are negative.   PHYSICAL EXAM:   VS:  BP 112/80 (BP Location: Left Arm, Patient Position: Sitting, Cuff Size: Large)   Pulse 82   Ht 5' 7 (1.702 m)   Wt 241 lb 12.8 oz (109.7 kg)   LMP  (LMP Unknown)   SpO2 93%   BMI 37.87 kg/m      General: Alert, oriented x3, no distress, morbidly obese.  Healthy left subclavian pacemaker site Head: no evidence of trauma, PERRL, EOMI, no exophtalmos or lid lag, no myxedema, no xanthelasma; normal ears, nose and oropharynx Neck: normal jugular venous pulsations and no hepatojugular reflux; brisk carotid pulses without delay and no carotid bruits Chest: clear to auscultation, no signs of consolidation by percussion or palpation, normal fremitus, symmetrical and full respiratory excursions Cardiovascular: normal position and quality of the apical impulse, regular rhythm, normal first and second heart sounds, no murmurs, rubs or gallops Abdomen: no tenderness or distention, no masses by palpation, no abnormal pulsatility or arterial bruits, normal bowel sounds, no hepatosplenomegaly Extremities: no clubbing, cyanosis or edema;  2+ radial, ulnar and brachial pulses bilaterally; 2+ right femoral, posterior tibial and dorsalis pedis pulses; 2+ left femoral, posterior tibial and dorsalis pedis pulses; no subclavian or femoral bruits Neurological: grossly nonfocal Psych: Normal mood and affect     Wt Readings from Last 3 Encounters:  06/24/24 241 lb 12.8 oz (109.7 kg)  06/22/24 242 lb 4.8 oz (109.9 kg)  05/19/24 243 lb 1.6 oz (110.3 kg)      Studies/Labs Reviewed:   ECHO 12/12/2022   1.  Left ventricular ejection fraction, by estimation, is 60 to 65%. The  left ventricle has normal function. The left ventricle has no regional  wall motion abnormalities. Left ventricular diastolic parameters are  consistent with Grade I diastolic dysfunction (impaired relaxation).   2. Right ventricular systolic function is normal. The right ventricular  size is normal. There is normal pulmonary artery systolic pressure. The  estimated right ventricular systolic pressure is 8.0 mmHg.   3. The mitral valve is degenerative. Mild mitral valve regurgitation. No  evidence of mitral stenosis.   4. The aortic valve is tricuspid. Aortic valve regurgitation is not  visualized. Aortic valve sclerosis is present, with no evidence of aortic  valve stenosis.   5. The inferior vena cava is normal in size with greater than 50%  respiratory variability, suggesting right atrial pressure of 3 mmHg.   EKG:  EKG is not ordered today.  Personally reviewed ECG from 02/09/2024 which shows atrial paced rhythm with normal QTc 424 ms.    Recent Labs: 01/14/2024: Magnesium  2.1 03/04/2024: BUN 16; Creatinine, Ser 1.03; Potassium 4.2; Sodium 135 03/09/2024: TSH 3.14 03/30/2024: Hemoglobin 14.9; Platelets 256.0   Lipid Panel    Component Value Date/Time   CHOL 195 07/28/2020 0913   CHOL 176 06/03/2018 1022   TRIG 133 07/28/2020 0913   HDL 55 07/28/2020 0913   HDL 53 06/03/2018 1022   CHOLHDL 3.5 07/28/2020 0913   VLDL 33.6 01/15/2017 1417   LDLCALC  115 (H) 07/28/2020 0913   LDLDIRECT 81.0 07/15/2016 1419    ASSESSMENT:    1. Dysphagia, unspecified type   2. Paroxysmal atrial fibrillation (HCC)   3. SSS (sick sinus syndrome) (HCC)   4. Pacemaker   5. PAH (pulmonary artery hypertension) (HCC)   6. Chronic diastolic heart failure (HCC)   7. Acquired thrombophilia (HCC)   8. Encounter for monitoring dofetilide  therapy   9. Severe obesity (BMI 35.0-39.9) with comorbidity (HCC)   10. OSA on CPAP, compliant   11. Fibrosis, idiopathic pulmonary (HCC)          PLAN:  In order of problems listed above:  AFib: Excellent response to dofetilide  with a very low burden of atrial arrhythmia and no evidence of proarrhythmic side effects.  It's been more than 2 years since she has had any meaningful atrial fibrillation.  Intracardiac electrogram today shows atrial paced, ventricular sensed rhythm.  CHADSVasc 5 (Age 83, gender, TIA).  On appropriate anticoagulation, without recent bleeding. SSS: Almost 100 % atrial pacing with appropriate heart rate histogram distribution.  Virtually never requires ventricular pacing.  She is not pacemaker dependent PPM: Normal device function.  Roughly 13 months of estimated device longevity.  Continue remote downloads every 3 months for now, increased to monthly towards the beginning of next year. PAH: She has pulmonary hypertension due to combination of WHO group 2 and group 3 etiology (diastolic heart failure and interstitial lung disease and obesity).  The lung problems seem to be the dominant cause. CHF: Appears clinically euvolemic, without the need for diuretics. BNP normal in 2018, but elevated in May 2022 (proBNP 289) and in June 2023 (BNP 134). Echo 2017 with normal EF, mild diastolic dysfunction without evidence of elevated filling pressures.   Anticoagulation: Has not had bleeding complications. Dofetilide : Most recent QT in appropriate range with normal electrolytes and renal function parameters.   She is aware of the need for routine monitoring of the QT interval and the possibility for multiple drug interactions with dofetilide   Obesity:  She understands that this is contributing to her dyspnea and is frustrated with her inability to lose weight. OSA: Reports compliance with CPAP and denies daytime hypersomnolence. IPF: Followed by Dr. Geronimo.  Did not tolerate treatment with pirfenidone  Dysphagia: Referred her to Dr. Belvie Just for second opinion regarding EGD and possible esophageal dilation.  From a cardiac point of view I think EGD would be a low risk procedure.  Similarly, temporarily discontinue her anticoagulation for EGD is highly unlikely be associated complications since her burden of atrial fibrillation is extremely low.    Medication Adjustments/Labs and Tests Ordered: Current medicines are reviewed at length with the patient today.  Concerns regarding medicines are outlined above.  Medication changes, Labs and Tests ordered today are listed in the Patient Instructions below. Patient Instructions  Medication Instructions:  No changes *If you need a refill on your cardiac medications before your next appointment, please call your pharmacy*  Lab Work: None ordered If you have labs (blood work) drawn today and your tests are completely normal, you will receive your results only by: MyChart Message (if you have MyChart) OR A paper copy in the mail If you have any lab test that is abnormal or we need to change your treatment, we will call you to review the results.  Testing/Procedures: None ordered  Follow-Up: At [facility], you and your health needs are our priority.  As part of our continuing mission to provide you with exceptional heart care, our providers are all part of one team.  This team includes your primary Cardiologist (physician) and Advanced Practice Providers or APPs (Physician Assistants and Nurse Practitioners) who all work together to provide  you with the care you need, when you need it.  Your next appointment:   1 year(s)  Provider:   Jerel Balding, MD    We recommend signing up for the patient portal called MyChart.  Sign up information is provided on this After Visit Summary.  MyChart is used to connect with patients for Virtual Visits (Telemedicine).  Patients are able to view lab/test results, encounter notes, upcoming appointments, etc.  Non-urgent messages can be sent to your provider as well.   To learn more about what you can do with MyChart, go to [email or web address].          Signed, [name] Balding, MD  06/29/2024 4:59 PM    [facility] [address] [place], [place], [place]  [address] Phone: [phone]; Fax: [phone]

## 2024-06-24 NOTE — Patient Instructions (Signed)

## 2024-06-28 DIAGNOSIS — Z9181 History of falling: Secondary | ICD-10-CM | POA: Diagnosis not present

## 2024-06-28 DIAGNOSIS — M6281 Muscle weakness (generalized): Secondary | ICD-10-CM | POA: Diagnosis not present

## 2024-06-28 DIAGNOSIS — R2681 Unsteadiness on feet: Secondary | ICD-10-CM | POA: Diagnosis not present

## 2024-06-28 DIAGNOSIS — R278 Other lack of coordination: Secondary | ICD-10-CM | POA: Diagnosis not present

## 2024-06-29 ENCOUNTER — Encounter: Payer: Self-pay | Admitting: Cardiovascular Disease

## 2024-06-29 DIAGNOSIS — J8410 Pulmonary fibrosis, unspecified: Secondary | ICD-10-CM | POA: Diagnosis not present

## 2024-06-29 DIAGNOSIS — I2510 Atherosclerotic heart disease of native coronary artery without angina pectoris: Secondary | ICD-10-CM | POA: Diagnosis not present

## 2024-06-30 DIAGNOSIS — M6281 Muscle weakness (generalized): Secondary | ICD-10-CM | POA: Diagnosis not present

## 2024-06-30 DIAGNOSIS — R2681 Unsteadiness on feet: Secondary | ICD-10-CM | POA: Diagnosis not present

## 2024-06-30 DIAGNOSIS — Z9181 History of falling: Secondary | ICD-10-CM | POA: Diagnosis not present

## 2024-06-30 DIAGNOSIS — R278 Other lack of coordination: Secondary | ICD-10-CM | POA: Diagnosis not present

## 2024-07-05 DIAGNOSIS — Z9181 History of falling: Secondary | ICD-10-CM | POA: Diagnosis not present

## 2024-07-05 DIAGNOSIS — R2681 Unsteadiness on feet: Secondary | ICD-10-CM | POA: Diagnosis not present

## 2024-07-05 DIAGNOSIS — M6281 Muscle weakness (generalized): Secondary | ICD-10-CM | POA: Diagnosis not present

## 2024-07-05 DIAGNOSIS — R278 Other lack of coordination: Secondary | ICD-10-CM | POA: Diagnosis not present

## 2024-07-07 ENCOUNTER — Ambulatory Visit (INDEPENDENT_AMBULATORY_CARE_PROVIDER_SITE_OTHER): Payer: Medicare Other

## 2024-07-07 ENCOUNTER — Ambulatory Visit: Payer: Self-pay

## 2024-07-07 ENCOUNTER — Telehealth: Payer: Self-pay | Admitting: Cardiovascular Disease

## 2024-07-07 DIAGNOSIS — I495 Sick sinus syndrome: Secondary | ICD-10-CM

## 2024-07-07 DIAGNOSIS — R1310 Dysphagia, unspecified: Secondary | ICD-10-CM

## 2024-07-07 DIAGNOSIS — R2681 Unsteadiness on feet: Secondary | ICD-10-CM | POA: Diagnosis not present

## 2024-07-07 DIAGNOSIS — M6281 Muscle weakness (generalized): Secondary | ICD-10-CM | POA: Diagnosis not present

## 2024-07-07 DIAGNOSIS — R278 Other lack of coordination: Secondary | ICD-10-CM | POA: Diagnosis not present

## 2024-07-07 DIAGNOSIS — Z9181 History of falling: Secondary | ICD-10-CM | POA: Diagnosis not present

## 2024-07-07 DIAGNOSIS — K222 Esophageal obstruction: Secondary | ICD-10-CM

## 2024-07-07 NOTE — Telephone Encounter (Signed)
 FYI Only or Action Required?: FYI only for provider.  Patient was last seen in primary care on 06/22/2024 by Micheal Wolm ORN, MD.  Called Nurse Triage reporting Cough.  Symptoms began a week ago.  Interventions attempted: Other: Sleeping in recliner.  Symptoms are: stable.  Triage Disposition: Information or Advice Only Call  Patient/caregiver understands and will follow disposition?: Yes  **Patient will call CPAP manufacturer to discuss possible malfunction**           Copied from CRM #[id number]. Topic: Clinical - Red Word Triage >> Jul 07, 2024 11:03 AM Joesph PARAS wrote: Red Word that prompted transfer to Nurse Triage: Been on CPAP for years and states she has been in her recliner for over a week now sleeping because she coughs and caries on on in bed. States CPAP may not be working how it should, as even with it she is having significant issues breathing. Reason for Disposition  Health information question, no triage required and triager able to answer question  Answer Assessment - Initial Assessment Questions 1. REASON FOR CALL: What is the main reason for your call? or How can I best help you?   Reports being on CPAP for years, in the last few weeks she awakes coughing.She now sleeps in the recliner, to help. She reports no air coming out of the machine. Machine maybe malfunctioning.  She reports cleaning the machine, once she pulls the machine away from her mouth she feels the air. Patient advised to contact the manufacturer for trouble shooting. She agrees with plan of care, and will call back if needed.  Protocols used: Information Only Call - No Triage-A-AH

## 2024-07-07 NOTE — Telephone Encounter (Signed)
 There is also [facility] here in town (Dr. Rosalie, [place], [place], etc.). We can try there if Dr. Rollin says no. I do not know any GI docs at [facility] or [facility], but will gladly refer her to either one of those facilities if she wants me to.

## 2024-07-07 NOTE — Telephone Encounter (Signed)
  Pt said she called Dr. Belvie Fava office to schedule the referral Dr. JAYSON sent for her. She said the person she spoke with told her she already seeing [facility] and she have to send a note and the referral to Dr. Rollin for review. She felt like they will not take as a patient and if Dr. JAYSON can recommend another GI office either [facility] or [facility]

## 2024-07-08 NOTE — Telephone Encounter (Signed)
 If having a flare -IPF may need to be seen for an acute work in visit

## 2024-07-08 NOTE — Telephone Encounter (Signed)
 Caller Giles) is following-up on a referral for this patient

## 2024-07-08 NOTE — Telephone Encounter (Signed)
 Spoke with Olam- she informed me that Dr Rollin cannot see the patient.    Called the patient and let her know about above.Will place a GI referral to [facility]. She verbalized understanding.

## 2024-07-08 NOTE — Telephone Encounter (Signed)
 I called and spoke to [name] [name]. [name] [name] informed of Tammy Parrett's note and [name] [name] verbalized understanding. [name] [name] states she is not having a flair up and she will just keep her October 2nd appointment. I informed [name] [name] to contact our office if she has any issues and [name] [name] verbalized understanding. NFN

## 2024-07-08 NOTE — Telephone Encounter (Signed)
 Called and spoke with the patient. Pt states she has not been using cpap for about 10 days. She is having to sleep on the recliner since she wakes up coughing in the bed. Pt has contacted DME company regarding CPAP issues and they are going to look at machine. I have scheduled pt ov 10/2 with TP.  I have printed pts CPAP download and have put it on Tammy's desk to review.

## 2024-07-12 ENCOUNTER — Ambulatory Visit: Payer: Self-pay | Admitting: Cardiovascular Disease

## 2024-07-12 DIAGNOSIS — M6281 Muscle weakness (generalized): Secondary | ICD-10-CM | POA: Diagnosis not present

## 2024-07-12 DIAGNOSIS — Z9181 History of falling: Secondary | ICD-10-CM | POA: Diagnosis not present

## 2024-07-12 DIAGNOSIS — R2681 Unsteadiness on feet: Secondary | ICD-10-CM | POA: Diagnosis not present

## 2024-07-12 DIAGNOSIS — R278 Other lack of coordination: Secondary | ICD-10-CM | POA: Diagnosis not present

## 2024-07-12 LAB — CUP PACEART REMOTE DEVICE CHECK
Battery Remaining Longevity: 13 mo
Battery Voltage: 2.88 V
Brady Statistic AP VP Percent: 0.08 %
Brady Statistic AP VS Percent: 97 %
Brady Statistic AS VP Percent: 0 %
Brady Statistic AS VS Percent: 2.92 %
Brady Statistic RA Percent Paced: 97.03 %
Brady Statistic RV Percent Paced: 0.09 %
Date Time Interrogation Session: 20250808125750
Implantable Lead Connection Status: 753985
Implantable Lead Connection Status: 753985
Implantable Lead Implant Date: 20151103
Implantable Lead Implant Date: 20151103
Implantable Lead Location: 753859
Implantable Lead Location: 753860
Implantable Lead Model: 5076
Implantable Lead Model: 5076
Implantable Pulse Generator Implant Date: 20151103
Lead Channel Impedance Value: 418 Ohm
Lead Channel Impedance Value: 475 Ohm
Lead Channel Impedance Value: 551 Ohm
Lead Channel Impedance Value: 589 Ohm
Lead Channel Pacing Threshold Amplitude: 0.5 V
Lead Channel Pacing Threshold Amplitude: 0.625 V
Lead Channel Pacing Threshold Pulse Width: 0.4 ms
Lead Channel Pacing Threshold Pulse Width: 0.4 ms
Lead Channel Sensing Intrinsic Amplitude: 4.375 mV
Lead Channel Sensing Intrinsic Amplitude: 4.375 mV
Lead Channel Sensing Intrinsic Amplitude: 8.125 mV
Lead Channel Sensing Intrinsic Amplitude: 8.125 mV
Lead Channel Setting Pacing Amplitude: 1.5 V
Lead Channel Setting Pacing Amplitude: 2 V
Lead Channel Setting Pacing Pulse Width: 0.4 ms
Lead Channel Setting Sensing Sensitivity: 2.8 mV
Zone Setting Status: 755011
Zone Setting Status: 755011

## 2024-07-13 ENCOUNTER — Ambulatory Visit (HOSPITAL_COMMUNITY)
Admission: RE | Admit: 2024-07-13 | Discharge: 2024-07-13 | Disposition: A | Discharge status: Home / Self Care | Payer: Medicare Other | Source: Ambulatory Visit | Attending: Physician Assistant | Admitting: Physician Assistant

## 2024-07-13 ENCOUNTER — Encounter (HOSPITAL_COMMUNITY): Payer: Self-pay | Admitting: Physician Assistant

## 2024-07-13 VITALS — BP 100/58 | HR 64 | Ht 67.0 in | Wt 240.8 lb

## 2024-07-13 DIAGNOSIS — D6869 Other thrombophilia: Secondary | ICD-10-CM | POA: Diagnosis not present

## 2024-07-13 DIAGNOSIS — Z5181 Encounter for therapeutic drug level monitoring: Secondary | ICD-10-CM | POA: Diagnosis not present

## 2024-07-13 DIAGNOSIS — I480 Paroxysmal atrial fibrillation: Secondary | ICD-10-CM | POA: Diagnosis not present

## 2024-07-13 DIAGNOSIS — Z79899 Other long term (current) drug therapy: Secondary | ICD-10-CM | POA: Diagnosis not present

## 2024-07-13 NOTE — Progress Notes (Signed)
 Primary Care Physician: Micheal Wolm ORN, MD Primary Cardiologist: Dr Francyne Primary Electrophysiologist: Dr Inocencio Referring Physician: Dr Inocencio [name] [name] [name] is a 83 y.o. female with a history of tachybradycardia syndrome s/p PPM, diastolic CHF, MVP, OSA, idiopathic pulmonary fibrosis, CVA, aortic atherosclerosis, atrial fibrillation who presents for follow up in the [facility]. Patient is on Eliquis  for a CHADS2VASC score of 8. Her afib burden was noted to be increasing on her device and she was referred to EP for evaluation. She was not felt to be a good ablation candidate and dofetilide  was recommended. She is s/p dofetilide  admission 4/4-03/08/22.  Patient returns for follow up for atrial fibrillation and dofetilide  monitoring. Her PPM has shown no interim episodes of afib. No bleeding issues on anticoagulation.   Today, she  denies symptoms of palpitations, chest pain, shortness of breath, orthopnea, PND, lower extremity edema, dizziness, presyncope, syncope, bleeding, or neurologic sequela. The patient is tolerating medications without difficulties and is otherwise without complaint today.    Atrial Fibrillation Risk Factors:  she does have symptoms or diagnosis of sleep apnea. she does not have a history of rheumatic fever.   Atrial Fibrillation Management history:  Previous antiarrhythmic drugs: dofetilide   Previous cardioversions: 2018 Previous ablations: none Anticoagulation history: Eliquis    Past Medical History:  Diagnosis Date   Alpha-1-antitrypsin deficiency carrier    Arthritis    all over   Atherosclerosis of aorta (HCC)    Atrial fibrillation (HCC)    Back pain    Complication of anesthesia    I woke up during 2 different procedures (10/04/2014)   Depression    Diastolic dysfunction    Eating disorder    Frequent UTI    GERD (gastroesophageal reflux disease)    Heart disease    Hypertension     Hypertriglyceridemia    Insulin  resistance    Long term current use of anticoagulant    Multiple lung nodules    Muscular deconditioning    MVP (mitral valve prolapse)    Obesity    OSA on CPAP    Osteoarthritis    Osteopenia    unspecified location   Pacemaker    PAF (paroxysmal atrial fibrillation) (HCC)    Positive ANA (antinuclear antibody)    Presbyesophagus    Presence of permanent cardiac pacemaker    Primary osteoarthritis of both hands    neck and spine and hips   Pulmonary fibrosis (HCC)    followed by pulmonolgy   PVC's (premature ventricular contractions)    Shortness of breath    Sinus arrest 10/2014   s/p Medtronic Advisa model A2DR01 serial number [id number]   Sleep apnea    Stroke (HCC) 01/2014   denies deficits on 10/04/2014   Tachy-brady syndrome (HCC) 10/2014   TIA (transient ischemic attack)    Vitamin D  deficiency     Current Outpatient Medications  Medication Sig Dispense Refill   Cholecalciferol (VITAMIN D3 PO) Take 1 tablet by mouth daily.     diazepam  (VALIUM ) 5 MG tablet TAKE 1 TABLET ONCE A DAY AS NEEDED. 30 tablet 1   dofetilide  (TIKOSYN ) 500 MCG capsule Take 1 capsule (500 mcg total) by mouth 2 (two) times daily. 180 capsule 1   ELIQUIS  5 MG TABS tablet TAKE ONE TABLET BY MOUTH TWICE DAILY 180 tablet 1   fluticasone  (FLONASE ) 50 MCG/ACT nasal spray 1 to 2 sprays per nostril once daily as needed for nasal congestion 16 g  0   fluticasone  (FLONASE ) 50 MCG/ACT nasal spray Place 1 spray into both nostrils daily. 16 g 2   ipratropium (ATROVENT ) 0.06 % nasal spray Place 2 sprays into both nostrils 4 (four) times daily as needed for rhinitis. 15 mL 12   Magnesium  400 MG CAPS Take 400 mg by mouth daily. 30 capsule 0   metoprolol  tartrate (LOPRESSOR ) 25 MG tablet Take 1/2 tablets (12.5 mg total) by mouth 2 (two) times daily. 90 tablet 2   nitroGLYCERIN  (NITROSTAT ) 0.4 MG SL tablet Place 1 tablet (0.4 mg total) under the tongue every 5 (five) minutes as  needed for chest pain. 25 tablet 1   nystatin  cream (MYCOSTATIN ) Apply to affected rash twice a day as needed 30 g 0   pantoprazole  (PROTONIX ) 40 MG tablet Take 1 tablet (40 mg total) by mouth 2 (two) times daily. 180 tablet 3   sertraline  (ZOLOFT ) 100 MG tablet TAKE ONE TABLET BY MOUTH ONCE DAILY. 90 tablet 3   No current facility-administered medications for this encounter.    ROS- All systems are reviewed and negative except as per the HPI above.  Physical Exam: Vitals:   07/13/24 1339  BP: (!) 100/58  Pulse: 64  Weight: 109.2 kg  Height: 5' 7 (1.702 m)    GEN: Well nourished, well developed in no acute distress CARDIAC: Regular rate and rhythm, no murmurs, rubs, gallops RESPIRATORY:  Clear to auscultation without rales, wheezing or rhonchi  ABDOMEN: Soft, non-tender, non-distended EXTREMITIES:  No edema; No deformity    Wt Readings from Last 3 Encounters:  07/13/24 109.2 kg  06/24/24 109.7 kg  06/22/24 109.9 kg    EKG today demonstrates  A paced rhythm, NST Vent. rate 64 BPM PR interval 220 ms QRS duration 68 ms QT/QTcB 422/435 ms   Echo 12/12/22 demonstrated   1. Left ventricular ejection fraction, by estimation, is 60 to 65%. The  left ventricle has normal function. The left ventricle has no regional  wall motion abnormalities. Left ventricular diastolic parameters are  consistent with Grade I diastolic dysfunction (impaired relaxation).   2. Right ventricular systolic function is normal. The right ventricular  size is normal. There is normal pulmonary artery systolic pressure. The  estimated right ventricular systolic pressure is 8.0 mmHg.   3. The mitral valve is degenerative. Mild mitral valve regurgitation. No  evidence of mitral stenosis.   4. The aortic valve is tricuspid. Aortic valve regurgitation is not  visualized. Aortic valve sclerosis is present, with no evidence of aortic  valve stenosis.   5. The inferior vena cava is normal in size with  greater than 50%  respiratory variability, suggesting right atrial pressure of 3 mmHg.    Epic records are reviewed at length today  CHA2DS2-VASc Score = 8  The patient's score is based upon: CHF History: 1 HTN History: 1 Diabetes History: 0 Stroke History: 2 Vascular Disease History: 1 Age Score: 2 Gender Score: 1       ASSESSMENT AND PLAN: Paroxysmal Atrial Fibrillation (ICD10:  I48.0) The patient's CHA2DS2-VASc score is 8, indicating a 10.8% annual risk of stroke.   S/p dofetilide  admission 03/2022 Last device interrogation showed no clinically significant mode switches.  Continue dofetilide  500 mcg BID Continue Lopressor  12.5 mg BID Continue Eliquis  5 mg BID  Secondary Hypercoagulable State (ICD10:  D68.69) The patient is at significant risk for stroke/thromboembolism based upon her CHA2DS2-VASc Score of 8.  Continue Apixaban  (Eliquis ). No bleeding issues.   High Risk Medication Monitoring (ICD  10: Z79.899) QT interval on ECG acceptable for dofetilide  monitoring. Check bmet/mag today.     Obesity Body mass index is 37.71 kg/m.  Encouraged lifestyle modification  OSA  Encouraged nightly CPAP  Tachybradycardia syndrome S/p PPM, followed by Dr Francyne   IPF Would avoid amiodarone   Follow up in the AF clinic in 6 months.    Daril Kicks PA-C [facility] [address] [place], [place] [address] [phone]

## 2024-07-14 ENCOUNTER — Ambulatory Visit (HOSPITAL_COMMUNITY): Payer: Self-pay | Admitting: Physician Assistant

## 2024-07-14 DIAGNOSIS — M6281 Muscle weakness (generalized): Secondary | ICD-10-CM | POA: Diagnosis not present

## 2024-07-14 DIAGNOSIS — R2681 Unsteadiness on feet: Secondary | ICD-10-CM | POA: Diagnosis not present

## 2024-07-14 DIAGNOSIS — R278 Other lack of coordination: Secondary | ICD-10-CM | POA: Diagnosis not present

## 2024-07-14 DIAGNOSIS — Z9181 History of falling: Secondary | ICD-10-CM | POA: Diagnosis not present

## 2024-07-14 LAB — BASIC METABOLIC PANEL WITH GFR
BUN/Creatinine Ratio: 14 (ref 12–28)
BUN: 16 mg/dL (ref 8–27)
CO2: 25 mmol/L (ref 20–29)
Calcium: 9.2 mg/dL (ref 8.7–10.3)
Chloride: 99 mmol/L (ref 96–106)
Creatinine, Ser: 1.11 mg/dL — ABNORMAL HIGH (ref 0.57–1.00)
Glucose: 117 mg/dL — ABNORMAL HIGH (ref 70–99)
Potassium: 4.7 mmol/L (ref 3.5–5.2)
Sodium: 136 mmol/L (ref 134–144)
eGFR: 49 mL/min/1.73 — ABNORMAL LOW (ref >59)

## 2024-07-14 LAB — MAGNESIUM: Magnesium: 2 mg/dL (ref 1.6–2.3)

## 2024-07-16 NOTE — Progress Notes (Signed)
  Device system confirmed to be MRI conditional, with implant date > 6 weeks ago, and no evidence of abandoned or epicardial leads in review of most recent CXR  Device last cleared by EP Provider: Charlies Arthur, PA  Clearance is good through for 1 year as long as parameters remain stable at time of check. If pt undergoes a cardiac device procedure during that time, they should be re-cleared.   Tachy-therapies to be programmed off if applicable with device back to pre-MRI settings after completion of exam.  Medtronic - Programming recommendation received through [employer]tronic App/Tablet  Suzan Recardo Arna Debby  07/16/2024 10:14 AM

## 2024-07-19 DIAGNOSIS — R2681 Unsteadiness on feet: Secondary | ICD-10-CM | POA: Diagnosis not present

## 2024-07-19 DIAGNOSIS — M6281 Muscle weakness (generalized): Secondary | ICD-10-CM | POA: Diagnosis not present

## 2024-07-19 DIAGNOSIS — R278 Other lack of coordination: Secondary | ICD-10-CM | POA: Diagnosis not present

## 2024-07-19 DIAGNOSIS — Z9181 History of falling: Secondary | ICD-10-CM | POA: Diagnosis not present

## 2024-07-20 ENCOUNTER — Ambulatory Visit (HOSPITAL_COMMUNITY)
Admission: RE | Admit: 2024-07-20 | Discharge: 2024-07-20 | Disposition: A | Discharge status: Home / Self Care | Source: Ambulatory Visit | Attending: Family Medicine | Admitting: Family Medicine

## 2024-07-20 DIAGNOSIS — R438 Other disturbances of smell and taste: Secondary | ICD-10-CM | POA: Insufficient documentation

## 2024-07-20 DIAGNOSIS — R251 Tremor, unspecified: Secondary | ICD-10-CM

## 2024-07-20 DIAGNOSIS — R4182 Altered mental status, unspecified: Secondary | ICD-10-CM | POA: Diagnosis not present

## 2024-07-20 DIAGNOSIS — R2689 Other abnormalities of gait and mobility: Secondary | ICD-10-CM | POA: Diagnosis not present

## 2024-07-20 NOTE — Progress Notes (Signed)
 Patient was monitored by this RN during MRI scan due to presence of a pacemaker. Cardiac rhythm was continuously monitored throughout the procedure. Prior to the start of the scan, the pacemaker was placed in MRI-safe mode by the MRI technician. Following the completion of the scan, the device was returned to its pre-MRI settings. Neurological status and orientation post-procedure were unchanged from baseline.   Pre-procedure Heart Rate (Prior to being placed in MRI safe mode):65-70 bpm Post-procedure Heart Rate (Once pacemaker is returned to baseline mode): 63 bpm

## 2024-07-21 DIAGNOSIS — R278 Other lack of coordination: Secondary | ICD-10-CM | POA: Diagnosis not present

## 2024-07-21 DIAGNOSIS — Z9181 History of falling: Secondary | ICD-10-CM | POA: Diagnosis not present

## 2024-07-21 DIAGNOSIS — R2681 Unsteadiness on feet: Secondary | ICD-10-CM | POA: Diagnosis not present

## 2024-07-21 DIAGNOSIS — M6281 Muscle weakness (generalized): Secondary | ICD-10-CM | POA: Diagnosis not present

## 2024-07-22 ENCOUNTER — Telehealth: Payer: Self-pay | Admitting: Cardiovascular Disease

## 2024-07-22 DIAGNOSIS — I480 Paroxysmal atrial fibrillation: Secondary | ICD-10-CM

## 2024-07-22 NOTE — Telephone Encounter (Signed)
*  STAT* If patient is at the pharmacy, call can be transferred to refill team.   1. Which medications need to be refilled? (please list name of each medication and dose if known)   ELIQUIS  5 MG TABS tablet     2. Would you like to learn more about the convenience, safety, & potential cost savings by using the [facility]?     3. Are you open to using the [facility] (Type [facility].  ).   4. Which pharmacy/location (including street and city if local pharmacy) is medication to be sent to? [facility]   5. Do they need a 30 day or 90 day supply? 90 day  Change in pharmacy

## 2024-07-23 ENCOUNTER — Other Ambulatory Visit: Payer: Self-pay

## 2024-07-23 DIAGNOSIS — I480 Paroxysmal atrial fibrillation: Secondary | ICD-10-CM

## 2024-07-25 MED ORDER — APIXABAN 5 MG PO TABS: 5.0000 mg | ORAL_TABLET | Freq: Two times a day (BID) | ORAL | 1 refills | Status: DC

## 2024-07-26 DIAGNOSIS — Z9181 History of falling: Secondary | ICD-10-CM | POA: Diagnosis not present

## 2024-07-26 DIAGNOSIS — R278 Other lack of coordination: Secondary | ICD-10-CM | POA: Diagnosis not present

## 2024-07-26 DIAGNOSIS — R2681 Unsteadiness on feet: Secondary | ICD-10-CM | POA: Diagnosis not present

## 2024-07-26 DIAGNOSIS — M6281 Muscle weakness (generalized): Secondary | ICD-10-CM | POA: Diagnosis not present

## 2024-07-27 ENCOUNTER — Other Ambulatory Visit: Payer: Self-pay | Admitting: *Deleted

## 2024-07-27 DIAGNOSIS — R2681 Unsteadiness on feet: Secondary | ICD-10-CM | POA: Diagnosis not present

## 2024-07-27 DIAGNOSIS — I480 Paroxysmal atrial fibrillation: Secondary | ICD-10-CM

## 2024-07-27 DIAGNOSIS — M6281 Muscle weakness (generalized): Secondary | ICD-10-CM | POA: Diagnosis not present

## 2024-07-27 DIAGNOSIS — Z9181 History of falling: Secondary | ICD-10-CM | POA: Diagnosis not present

## 2024-07-27 DIAGNOSIS — R278 Other lack of coordination: Secondary | ICD-10-CM | POA: Diagnosis not present

## 2024-07-27 MED ORDER — APIXABAN 5 MG PO TABS: 5.0000 mg | ORAL_TABLET | Freq: Two times a day (BID) | ORAL | 1 refills | Status: DC

## 2024-07-27 NOTE — Telephone Encounter (Signed)
 Prescription refill request for Eliquis  received. Indication: PAF Last office visit: 07/13/24  C Fenton PA Scr: 1.11 on 07/13/24  Epic Age: 83 Weight: 109.2kg  Based on above findings Eliquis  5mg  twice daily is the appropriate dose.  Refill approved.

## 2024-07-28 DIAGNOSIS — D225 Melanocytic nevi of trunk: Secondary | ICD-10-CM | POA: Diagnosis not present

## 2024-07-28 DIAGNOSIS — D2262 Melanocytic nevi of left upper limb, including shoulder: Secondary | ICD-10-CM | POA: Diagnosis not present

## 2024-07-28 DIAGNOSIS — L821 Other seborrheic keratosis: Secondary | ICD-10-CM | POA: Diagnosis not present

## 2024-07-28 DIAGNOSIS — D2272 Melanocytic nevi of left lower limb, including hip: Secondary | ICD-10-CM | POA: Diagnosis not present

## 2024-07-30 ENCOUNTER — Ambulatory Visit: Payer: Self-pay | Admitting: Family Medicine

## 2024-07-30 DIAGNOSIS — I2510 Atherosclerotic heart disease of native coronary artery without angina pectoris: Secondary | ICD-10-CM | POA: Diagnosis not present

## 2024-07-30 DIAGNOSIS — J8410 Pulmonary fibrosis, unspecified: Secondary | ICD-10-CM | POA: Diagnosis not present

## 2024-08-02 DIAGNOSIS — M6281 Muscle weakness (generalized): Secondary | ICD-10-CM | POA: Diagnosis not present

## 2024-08-02 DIAGNOSIS — R278 Other lack of coordination: Secondary | ICD-10-CM | POA: Diagnosis not present

## 2024-08-02 DIAGNOSIS — Z9181 History of falling: Secondary | ICD-10-CM | POA: Diagnosis not present

## 2024-08-02 DIAGNOSIS — R2681 Unsteadiness on feet: Secondary | ICD-10-CM | POA: Diagnosis not present

## 2024-08-04 ENCOUNTER — Telehealth: Payer: Self-pay

## 2024-08-04 DIAGNOSIS — R2681 Unsteadiness on feet: Secondary | ICD-10-CM | POA: Diagnosis not present

## 2024-08-04 DIAGNOSIS — Z9181 History of falling: Secondary | ICD-10-CM | POA: Diagnosis not present

## 2024-08-04 DIAGNOSIS — R1314 Dysphagia, pharyngoesophageal phase: Secondary | ICD-10-CM | POA: Diagnosis not present

## 2024-08-04 DIAGNOSIS — R41841 Cognitive communication deficit: Secondary | ICD-10-CM | POA: Diagnosis not present

## 2024-08-04 DIAGNOSIS — R278 Other lack of coordination: Secondary | ICD-10-CM | POA: Diagnosis not present

## 2024-08-04 DIAGNOSIS — M6281 Muscle weakness (generalized): Secondary | ICD-10-CM | POA: Diagnosis not present

## 2024-08-04 NOTE — Telephone Encounter (Signed)
 Please see result note

## 2024-08-04 NOTE — Telephone Encounter (Signed)
 Copied from CRM #[id number]. Topic: General - Other >> Aug 04, 2024  2:47 PM Berneda F wrote: Reason for CRM: Pt is returning phone call to Seiya Silsby. Called the CAL to see if he was available. Was informed he was in a room with a patient. She is heading to a meeting and requested he please call her back tomorrow  Please call patient back at [phone]

## 2024-08-04 NOTE — Telephone Encounter (Unsigned)
 Copied from CRM #[id number]. Topic: Appointments - Scheduling Inquiry for Clinic >> Aug 04, 2024 10:58 AM Vena HERO wrote: Reason for CRM: Pt called in and wanted to know since her MRI was good, does she still need to see the neurologist or can she cancel that appt? Please call [phone] to advise

## 2024-08-06 DIAGNOSIS — Z7901 Long term (current) use of anticoagulants: Secondary | ICD-10-CM | POA: Diagnosis not present

## 2024-08-06 DIAGNOSIS — R1310 Dysphagia, unspecified: Secondary | ICD-10-CM | POA: Diagnosis not present

## 2024-08-09 ENCOUNTER — Telehealth: Payer: Self-pay

## 2024-08-09 DIAGNOSIS — Z9181 History of falling: Secondary | ICD-10-CM | POA: Diagnosis not present

## 2024-08-09 DIAGNOSIS — R2681 Unsteadiness on feet: Secondary | ICD-10-CM | POA: Diagnosis not present

## 2024-08-09 DIAGNOSIS — R278 Other lack of coordination: Secondary | ICD-10-CM | POA: Diagnosis not present

## 2024-08-09 DIAGNOSIS — M6281 Muscle weakness (generalized): Secondary | ICD-10-CM | POA: Diagnosis not present

## 2024-08-09 NOTE — Telephone Encounter (Signed)
   Pre-operative Risk Assessment    Patient Name: [name] [name]  DOB: January 01, 1941 MRN: [id number]   Date of last office visit: 06/24/24 JEREL BALDING, MD Date of next office visit: NONE   Request for Surgical Clearance    Procedure:  ENDOSCOPY WITH DILATATION/ BOTOX  Date of Surgery:  Clearance TBD                                Surgeon:  DR ESTELITA MANAS Surgeon's Group or Practice Name:  [facility] Phone number:  [phone] Fax number:  [phone]   Type of Clearance Requested:   - Medical  - Pharmacy:  Hold Apixaban  (Eliquis )     Type of Anesthesia:  PROPOFOL    Additional requests/questions:    SignedLucie DELENA Ku   08/09/2024, 3:22 PM

## 2024-08-10 DIAGNOSIS — R41841 Cognitive communication deficit: Secondary | ICD-10-CM | POA: Diagnosis not present

## 2024-08-10 DIAGNOSIS — R1314 Dysphagia, pharyngoesophageal phase: Secondary | ICD-10-CM | POA: Diagnosis not present

## 2024-08-11 DIAGNOSIS — R278 Other lack of coordination: Secondary | ICD-10-CM | POA: Diagnosis not present

## 2024-08-11 DIAGNOSIS — Z9181 History of falling: Secondary | ICD-10-CM | POA: Diagnosis not present

## 2024-08-11 DIAGNOSIS — M6281 Muscle weakness (generalized): Secondary | ICD-10-CM | POA: Diagnosis not present

## 2024-08-11 DIAGNOSIS — R2681 Unsteadiness on feet: Secondary | ICD-10-CM | POA: Diagnosis not present

## 2024-08-12 NOTE — Telephone Encounter (Signed)
 S/W pt and scheduled TELE Preop appt for 09/08/24. Med Red and Consent done.  Pt stated that procedure would be possibly be in Nov 2025  Will update the surgeons office

## 2024-08-12 NOTE — Telephone Encounter (Signed)
   Name: [name] [name]  DOB: 1941-04-04  MRN: [id number]  Primary Cardiologist: Jerel Balding, MD   Preoperative team, please contact this patient and set up a phone call appointment for further preoperative risk assessment. Please obtain consent and complete medication review. Thank you for your help.  I confirm that guidance regarding antiplatelet and oral anticoagulation therapy has been completed and, if necessary, noted below.  I also confirmed the patient resides in the state of [place] . As per [facility] Medical Board telemedicine laws, the patient must reside in the state in which the provider is licensed.   Jon Garre Cutter Passey, PA 08/12/2024, 2:00 PM [facility]

## 2024-08-12 NOTE — Telephone Encounter (Signed)
 Patient with diagnosis of Afib on Eliquis  for anticoagulation.    Procedure: ENDOSCOPY WITH DILATATION/ BOTOX  Date of procedure: TBD   CHA2DS2-VASc Score = 8   This indicates a 10.8% annual risk of stroke. The patient's score is based upon: CHF History: 1 HTN History: 1 Diabetes History: 0 Stroke History: 2 Vascular Disease History: 1 Age Score: 2 Gender Score: 1    CrCl 44 ml/min (adjusted BW) Platelet count 256K  Patient has not had an Afib/aflutter ablation within the last 3 months or DCCV within the last 30 days  Per office protocol, patient can hold Eliquis  for 1 day prior to procedure.   Patient carline not need bridging with Lovenox  (enoxaparin ) around procedure.  **This guidance is not considered finalized until pre-operative APP has relayed final recommendations.**

## 2024-08-16 DIAGNOSIS — R278 Other lack of coordination: Secondary | ICD-10-CM | POA: Diagnosis not present

## 2024-08-16 DIAGNOSIS — Z9181 History of falling: Secondary | ICD-10-CM | POA: Diagnosis not present

## 2024-08-16 DIAGNOSIS — M6281 Muscle weakness (generalized): Secondary | ICD-10-CM | POA: Diagnosis not present

## 2024-08-16 DIAGNOSIS — R2681 Unsteadiness on feet: Secondary | ICD-10-CM | POA: Diagnosis not present

## 2024-08-17 ENCOUNTER — Ambulatory Visit: Payer: Self-pay | Admitting: Internal Medicine

## 2024-08-17 DIAGNOSIS — J209 Acute bronchitis, unspecified: Secondary | ICD-10-CM

## 2024-08-17 MED ORDER — PREDNISONE 10 MG PO TABS: ORAL_TABLET | ORAL | 0 refills | Status: AC

## 2024-08-17 NOTE — Telephone Encounter (Signed)
 FYI Only or Action Required?: Action required by provider: clinical question for provider. Patient is going to test herself for COVID and call with results. Patient is asking if medication can be sent into her pharmacy. Exposed to COVID by 2 positive individuals. Does live in [place].   Patient was last seen in primary care on 06/22/2024 by Micheal Wolm ORN, MD.  Called Nurse Triage reporting Cough.  Symptoms began today.  Interventions attempted: Rest, hydration, or home remedies.  Symptoms are: unchanged.  Triage Disposition: See HCP Within 4 Hours (Or PCP Triage)  Patient/caregiver understands and will follow disposition?: No, wishes to speak with PCP  Copied from CRM [id number]. Topic: Clinical - Medical Advice >> Aug 17, 2024 12:39 PM Nathanel DEL wrote: Reason for CRM: Pt exposed to 2 people who have covid  pt woke up this am coughing.  Has several medical condtions and if she gets covid there is concern concern. Reason for Disposition  Wheezing is present  Answer Assessment - Initial Assessment Questions 1. ONSET: When did the cough begin?      Cough increased this morning 2. SEVERITY: How bad is the cough today?     Moderate-severe 3. SPUTUM: Describe the color of your sputum (e.g., none, dry cough; clear, white, yellow, green)     Green 4. HEMOPTYSIS: Are you coughing up any blood? If Yes, ask: How much? (e.g., flecks, streaks, tablespoons, etc.)     no 5. DIFFICULTY BREATHING: Are you having difficulty breathing? If Yes, ask: How bad is it? (e.g., mild, moderate, severe)      Baseline shortness of breath 6. FEVER: Do you have a fever? If Yes, ask: What is your temperature, how was it measured, and when did it start?     no 7. CARDIAC HISTORY: Do you have any history of heart disease? (e.g., heart attack, congestive heart failure)      CHF 8. LUNG HISTORY: Do you have any history of lung disease?  (e.g., pulmonary embolus, asthma, emphysema)      Pulmonary fibrosis, pulmonary hypertension 9. PE RISK FACTORS: Do you have a history of blood clots? (or: recent major surgery, recent prolonged travel, bedridden)     no 10. OTHER SYMPTOMS: Do you have any other symptoms? (e.g., runny nose, wheezing, chest pain)       wheezing 12. TRAVEL: Have you traveled out of the country in the last month? (e.g., travel history, exposures)       No  Patient is reports having exposure to 2 individuals who tested positive for COVID. Patient reports increased cough today and states she has been coughing up green mucus for a week now. Patient is recommended to complete a home test for COVID and call back to the office. Patient is asking if medication can just be sent to her pharmacy so she doesn't have to be seen. Patient is asking for a follow up call.  Protocols used: Cough - Acute Productive-A-AH

## 2024-08-17 NOTE — Telephone Encounter (Signed)
 For cough/wheezing - can try Please take prednisone  40 mg x1 day, then 30 mg x1 day, then 20 mg x1 day, then 10 mg x1 day, and then 5 mg x1 day and stop  But for covid  - if she turns positive we can call in paxlovid but she needs to call back with a positive covid  If phlegm turns color  - can call in antibiotic  If she gets hypoxemic  - go to ER

## 2024-08-17 NOTE — Telephone Encounter (Signed)
 Called and spoke with the patient and she has not tested for COVID yet.  Dr. Geronimo please advise pt is requesting antibiotics.

## 2024-08-18 DIAGNOSIS — R278 Other lack of coordination: Secondary | ICD-10-CM | POA: Diagnosis not present

## 2024-08-18 DIAGNOSIS — M6281 Muscle weakness (generalized): Secondary | ICD-10-CM | POA: Diagnosis not present

## 2024-08-18 DIAGNOSIS — R2681 Unsteadiness on feet: Secondary | ICD-10-CM | POA: Diagnosis not present

## 2024-08-18 DIAGNOSIS — Z9181 History of falling: Secondary | ICD-10-CM | POA: Diagnosis not present

## 2024-08-19 ENCOUNTER — Telehealth: Payer: Self-pay | Admitting: *Deleted

## 2024-08-19 NOTE — Telephone Encounter (Signed)
 Spoke with the patient and informed her a visit is needed prior to medication being sent in.  Patient stated she just called the pharmacy for something and was told Prednisone  was sent in by pulmonologist and she will contact his office for further information.

## 2024-08-19 NOTE — Telephone Encounter (Signed)
 Copied from CRM [id number]. Topic: General - Other >> Aug 19, 2024 11:09 AM Revonda D wrote: Reason for CRM: Pt stated that she recently saw a nurse and the nurse stated that she seen something in the pt's lower lungs. Pt would like to know if Dr.Burchette is able to prescribed a medication or if she needs to come in first. Pt would like a callback with an update.

## 2024-08-20 NOTE — Telephone Encounter (Signed)
 Pt ws notified in previous encounter

## 2024-08-25 DIAGNOSIS — R2681 Unsteadiness on feet: Secondary | ICD-10-CM | POA: Diagnosis not present

## 2024-08-25 DIAGNOSIS — Z9181 History of falling: Secondary | ICD-10-CM | POA: Diagnosis not present

## 2024-08-25 DIAGNOSIS — M6281 Muscle weakness (generalized): Secondary | ICD-10-CM | POA: Diagnosis not present

## 2024-08-25 DIAGNOSIS — R278 Other lack of coordination: Secondary | ICD-10-CM | POA: Diagnosis not present

## 2024-08-30 ENCOUNTER — Other Ambulatory Visit: Payer: Self-pay | Admitting: Cardiovascular Disease

## 2024-08-30 DIAGNOSIS — I480 Paroxysmal atrial fibrillation: Secondary | ICD-10-CM

## 2024-08-30 DIAGNOSIS — J8410 Pulmonary fibrosis, unspecified: Secondary | ICD-10-CM | POA: Diagnosis not present

## 2024-08-30 DIAGNOSIS — I2510 Atherosclerotic heart disease of native coronary artery without angina pectoris: Secondary | ICD-10-CM | POA: Diagnosis not present

## 2024-08-30 MED ORDER — APIXABAN 5 MG PO TABS
5.0000 mg | ORAL_TABLET | Freq: Two times a day (BID) | ORAL | 2 refills | Status: AC
Start: 2024-08-30 — End: ?

## 2024-08-30 NOTE — Progress Notes (Signed)
 Remote PPM Transmission

## 2024-08-31 ENCOUNTER — Ambulatory Visit: Admitting: Neurology

## 2024-08-31 ENCOUNTER — Encounter: Payer: Self-pay | Admitting: Neurology

## 2024-08-31 VITALS — BP 130/74 | Ht 63.0 in | Wt 231.0 lb

## 2024-08-31 DIAGNOSIS — R4189 Other symptoms and signs involving cognitive functions and awareness: Secondary | ICD-10-CM | POA: Insufficient documentation

## 2024-08-31 DIAGNOSIS — R441 Visual hallucinations: Secondary | ICD-10-CM | POA: Insufficient documentation

## 2024-08-31 NOTE — Progress Notes (Signed)
 Chief Complaint  Patient presents with   Follow-up    Rm 14, alone, NP memory,      ASSESSMENT AND PLAN  [name] [name] is a 83 y.o. female   Mild cognitive impairment, Vivid dreams, lost sense of smells, visual hallucinations,  MoCA examination 23/30  Constellation of complaints to raise the possibility of central nervous system degenerative disorder, not typical for Alzheimer's disease, differentiation diagnosis also including Lewy body dementia, there was no significant parkinsonian features on examination, she do have mild gait abnormality that is related to her joint issues  Discussed with patient the extent of potential evaluation, she would want to remain the evaluation to treatable etiology, laboratory evaluation today  She to realize the visual hallucination is not real, does not want to try any medication now   Will inform her lab result, continue to follow-up with her primary care, return to clinic for worsening symptoms   DIAGNOSTIC DATA (LABS, IMAGING, TESTING) - I reviewed patient records, labs, notes, testing and imaging myself where available.   MEDICAL HISTORY:  [name] [name] is a 83 year old female, seen in request by her primary care doctor Micheal Pin for evaluation of cognitive malfunction, visual hallucinations, initial evaluation August 31, 2024   History is obtained from the patient and review of electronic medical records. I personally reviewed pertinent available imaging films in PACS.   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  She lives alone at [facility] independent living since 2021, spent most of her time in her room, is a retired Scientist, research (physical sciences), over the past couple years, she noticed mild memory loss, but denies significant difficulty  handling her daily activity, enjoying reading, watching TV, entertaining herself  Since March 2025, she began had intermittent episode of visual hallucination, describes seeing bugs, ants on the wall, then quickly realized that is not real, also described the plant in her room began to swirling,  She had long history of mild sleep-related abnormality, sleep walking in her 30s, remembering woke up in her kitchen to react on her dream  Now she complains of vivid dreams, she has lost sense of smell for many years, she denies tremor, though has mild gait abnormality that she contributed to her hip and knee issues  MRI of the brain July 20, 2024, mild to moderate small vessel disease CT angiogram July 2024: Multivessel intracranial atherosclerotic disease, no hemodynamic significant stenosis in the neck  Laboratory in 2025, normal TSH, CPK, vitamin D , CBC, BMP showed mild elevation of creatinine 1.11   PHYSICAL EXAM:   Vitals:   08/31/24 1430  BP: 130/74  Weight: 231 lb (104.8 kg)  Height: 5' 3 (1.6 m)     Body mass index is 40.92 kg/m.  PHYSICAL EXAMNIATION:  Gen: NAD, conversant, well nourised, well groomed                     Cardiovascular: Regular rate rhythm, no peripheral edema, warm, nontender. Eyes: Conjunctivae clear without exudates or hemorrhage Neck: Supple, no carotid bruits. Pulmonary: Clear to auscultation bilaterally   NEUROLOGICAL EXAM:  MENTAL STATUS: Speech/cognition: Awake, alert, oriented to history taking and casual conversation    08/31/2024    2:31 PM  Montreal Cognitive Assessment   Visuospatial/ Executive (0/5) 3  Naming (0/3) 3  Attention: Read list of digits (0/2) 2  Attention: Read list of letters (0/1) 1  Attention: Serial 7 subtraction starting at 100 (0/3) 1  Language: Repeat phrase (0/2) 1  Language : Fluency (0/1) 1  Abstraction (0/2) 1  Delayed Recall (0/5) 5  Orientation (0/6) 5  Total 23    CRANIAL NERVES: CN II: Visual fields  are full to confrontation. Pupils are round equal and briskly reactive to light. CN III, IV, VI: extraocular movement are normal. No ptosis. CN V: Facial sensation is intact to light touch CN VII: Face is symmetric with normal eye closure  CN VIII: Hearing is normal to causal conversation. CN IX, X: Phonation is normal. CN XI: Head turning and shoulder shrug are intact  MOTOR: There is no pronator drift of out-stretched arms. Muscle bulk and tone are normal. Muscle strength is normal.  REFLEXES: Reflexes are 1 and symmetric at the biceps, triceps, knees, and ankles. Plantar responses are flexor.  SENSORY: Intact to light touch, pinprick and vibratory sensation are intact in fingers and toes.  COORDINATION: There is no trunk or limb dysmetria noted.  GAIT/STANCE: Multiple effort to push-up from seated position, mildly antalgic  REVIEW OF SYSTEMS:  Full 14 system review of systems performed and notable only for as above All other review of systems were negative.   ALLERGIES: Allergies  Allergen Reactions   Cefazolin  Hives and Itching    Other reaction(s): severe hives   Pseudoephedrine Hcl     Other reaction(s): palpitations (moderate)   Ergotamine     unknown   Other Other (See Comments)   Pentobarbital     unknown   Pirfenidone      unknown   Caffeine Palpitations    HOME MEDICATIONS: Current Outpatient Medications  Medication Sig Dispense Refill   apixaban  (ELIQUIS ) 5 MG TABS tablet Take 1 tablet (5 mg total) by mouth 2 (two) times daily. 180 tablet 2   Cholecalciferol (VITAMIN D3 PO) Take 1 tablet by mouth daily.     diazepam  (VALIUM ) 5 MG tablet TAKE 1 TABLET ONCE A DAY AS NEEDED. 30 tablet 1   dofetilide  (TIKOSYN ) 500 MCG capsule Take 1 capsule (500 mcg total) by mouth 2 (two) times daily. 180 capsule 1   fluticasone  (FLONASE ) 50 MCG/ACT nasal spray 1 to 2 sprays per nostril once daily as needed for nasal congestion 16 g 0   fluticasone  (FLONASE ) 50 MCG/ACT  nasal spray Place 1 spray into both nostrils daily. 16 g 2   ipratropium (ATROVENT ) 0.06 % nasal spray Place 2 sprays into both nostrils 4 (four) times daily as needed for rhinitis. 15 mL 12   Magnesium  400 MG CAPS Take 400 mg by mouth daily. 30 capsule 0   metoprolol  tartrate (LOPRESSOR ) 25 MG tablet Take 1/2 tablets (12.5 mg total) by mouth 2 (two) times daily. 90 tablet 2   nitroGLYCERIN  (NITROSTAT ) 0.4 MG SL tablet Place 1 tablet (0.4 mg total) under the tongue every 5 (five) minutes as needed for chest pain. 25 tablet 1   nystatin  cream (MYCOSTATIN ) Apply to affected rash twice a day as needed 30 g 0   pantoprazole  (PROTONIX ) 40 MG tablet Take 1 tablet (40 mg total) by mouth 2 (two) times daily. 180 tablet 3   predniSONE  (DELTASONE ) 10 MG tablet Please take prednisone  40 mg x1 day, then 30 mg x1 day, then 20 mg x1 day, then 10 mg x1 day, and then 5 mg x1 day and stop 11 tablet 0   sertraline  (ZOLOFT ) 100 MG tablet TAKE ONE TABLET  BY MOUTH ONCE DAILY. 90 tablet 3   No current facility-administered medications for this visit.    PAST MEDICAL HISTORY: Past Medical History:  Diagnosis Date   Alpha-1-antitrypsin deficiency carrier    Arthritis    all over   Atherosclerosis of aorta    Atrial fibrillation (HCC)    Back pain    Complication of anesthesia    I woke up during 2 different procedures (10/04/2014)   Depression    Diastolic dysfunction    Eating disorder    Frequent UTI    GERD (gastroesophageal reflux disease)    Heart disease    Hypertension    Hypertriglyceridemia    Insulin  resistance    Long term current use of anticoagulant    Multiple lung nodules    Muscular deconditioning    MVP (mitral valve prolapse)    Obesity    OSA on CPAP    Osteoarthritis    Osteopenia    unspecified location   Pacemaker    PAF (paroxysmal atrial fibrillation) (HCC)    Positive ANA (antinuclear antibody)    Presbyesophagus    Presence of permanent cardiac pacemaker    Primary  osteoarthritis of both hands    neck and spine and hips   Pulmonary fibrosis (HCC)    followed by pulmonolgy   PVC's (premature ventricular contractions)    Shortness of breath    Sinus arrest 10/2014   s/p Medtronic Advisa model A2DR01 serial number [id number]   Sleep apnea    Stroke (HCC) 01/2014   denies deficits on 10/04/2014   Tachy-brady syndrome (HCC) 10/2014   TIA (transient ischemic attack)    Vitamin D  deficiency     PAST SURGICAL HISTORY: Past Surgical History:  Procedure Laterality Date   CARDIOVERSION N/A 09/02/2017   Procedure: CARDIOVERSION;  Surgeon: Francyne Headland, MD;  Location: [facility];  Service: Cardiovascular;  Laterality: N/A;   COLONOSCOPY  2019   ESOPHAGEAL DILATION     EXCISION VAGINAL CYST     benign nodules   INSERT / REPLACE / REMOVE PACEMAKER  10/04/2014   Medtronic Advisa model A2DR01 serial number [id number]   LOOP RECORDER EXPLANT N/A 10/04/2014   Procedure: LOOP RECORDER EXPLANT;  Surgeon: Headland Francyne, MD;  Location: [facility];  Service: Cardiovascular;  Laterality: N/A;   LOOP RECORDER IMPLANT N/A 04/19/2014   Procedure: LOOP RECORDER IMPLANT;  Surgeon: Headland Francyne, MD;  Location: [facility];  Service: Cardiovascular;  Laterality: N/A;   NM MYOCAR PERF WALL MOTION  12/18/2007   normal   PERMANENT PACEMAKER INSERTION N/A 10/04/2014   Procedure: PERMANENT PACEMAKER INSERTION;  Surgeon: Headland Francyne, MD;  Location: [facility];  Service: Cardiovascular;  Laterality: N/A;   TUBAL LIGATION     US  ECHOCARDIOGRAPHY  05/15/2010   LA mildly dilated,mild mitral annular ca+, AOV mildly sclerotic, mild asymmetric LVH    FAMILY HISTORY: Family History  Problem Relation Age of Onset   Heart attack Mother    Sudden death Mother    Stroke Father    Heart failure Father    Alcoholism Brother    Healthy Sister    Stroke Brother    Alpha-1 antitrypsin deficiency Daughter    Breast cancer Daughter    Prostate cancer Brother    Stroke Brother     Prostate cancer Brother    Healthy Brother    Prostate cancer Brother    Healthy Daughter    Diabetes Son    Arthritis Son  SOCIAL HISTORY: Social History   Socioeconomic History   Marital status: Divorced    Spouse name: Not on file   Number of children: 3   Years of education: college   Highest education level: Not on file  Occupational History   Occupation: Retired Scientist, research (physical sciences)   Occupation: retired  Tobacco Use   Smoking status: Former    Current packs/day: 0.00    Average packs/day: 2.0 packs/day for 18.0 years (36.0 ttl pk-yrs)    Types: Cigarettes    Start date: 07/18/1952    Quit date: 07/18/1970    Years since quitting: 54.1   Smokeless tobacco: Never   Tobacco comments:    Former smoker 07/18/22 quit smoking in 1971  Vaping Use   Vaping status: Never Used  Substance and Sexual Activity   Alcohol use: Yes    Alcohol/week: 0.0 standard drinks of alcohol    Comment: 10/04/2014 might have a drink a couple times/yr   Drug use: No   Sexual activity: Never  Other Topics Concern   Not on file  Social History Narrative   [facility]    Patient is right handed.   Patient has a college degree.   Patient is retired.      Social History      Diet? Awful sweets are a terrible addiction       Do you drink/eat things with caffeine? some      Marital status?          divorced                          What year were you married? 1960      Do you live in a house, apartment, assisted living, condo, trailer, etc.? [facility]       Is it one or more stories? 1st level      How many persons live in your home? 1      Do you have any pets in your home? (please list) no       Highest level of education completed? College graduate      Current or past profession: realtor       Do you exercise?             Not much                          Type & how often? Barely any  - bed exercise for injured knee      Advanced Directives      Do you  have a living will? yes      Do you have a DNR form?                                  If not, do you want to discuss one?yes      Do you have signed POA/HPOA for forms? yes      Functional Status      Do you have difficulty bathing or dressing yourself? No       Do you have difficulty preparing food or eating? No       Do you have difficulty managing your medications? No       Do you have difficulty managing your finances? No       Do you have difficulty affording  your medications? No  Except eloquist after donut hole - so far I have been able to get samples from cardiologist      Social Drivers of Health   Financial Resource Strain: Low Risk  (01/22/2023)   Overall Financial Resource Strain (CARDIA)    Difficulty of Paying Living Expenses: Not hard at all  Food Insecurity: No Food Insecurity (01/22/2023)   Hunger Vital Sign    Worried About Running Out of Food in the Last Year: Never true    Ran Out of Food in the Last Year: Never true  Transportation Needs: No Transportation Needs (01/22/2023)   PRAPARE - Administrator, Civil Service (Medical): No    Lack of Transportation (Non-Medical): No  Physical Activity: Sufficiently Active (01/22/2023)   Exercise Vital Sign    Days of Exercise per Week: 4 days    Minutes of Exercise per Session: 40 min  Recent Concern: Physical Activity - Insufficiently Active (01/22/2023)   Exercise Vital Sign    Days of Exercise per Week: 3 days    Minutes of Exercise per Session: 40 min  Stress: No Stress Concern Present (01/22/2023)   [employer] of Occupational Health - Occupational Stress Questionnaire    Feeling of Stress : Not at all  Social Connections: Unknown (01/22/2023)   Social Connection and Isolation Panel    Frequency of Communication with Friends and Family: More than three times a week    Frequency of Social Gatherings with Friends and Family: More than three times a week    Attends Religious Services: More than 4  times per year    Active Member of [employer] or Organizations: Yes    Attends Banker Meetings: More than 4 times per year    Marital Status: Patient declined  Intimate Partner Violence: Not At Risk (01/22/2023)   Humiliation, Afraid, Rape, and Kick questionnaire    Fear of Current or Ex-Partner: No    Emotionally Abused: No    Physically Abused: No    Sexually Abused: No      Modena Callander, M.D. Ph.D.  [facility] [address] [place], [place] [address] Ph: [phone] Fax: [phone]  CC:  Micheal Wolm ORN, MD [address] [place],  [place] [address]  Micheal Wolm ORN, MD

## 2024-09-01 ENCOUNTER — Ambulatory Visit: Payer: Self-pay | Admitting: Neurology

## 2024-09-01 LAB — VITAMIN B12: Vitamin B-12: 408 pg/mL (ref 232–1245)

## 2024-09-01 LAB — RPR: RPR Ser Ql: NONREACTIVE

## 2024-09-01 LAB — FOLATE: Folate: 9.3 ng/mL (ref >3.0)

## 2024-09-02 ENCOUNTER — Ambulatory Visit: Admitting: Adult Health

## 2024-09-02 ENCOUNTER — Telehealth: Payer: Self-pay | Admitting: Cardiovascular Disease

## 2024-09-02 ENCOUNTER — Encounter: Payer: Self-pay | Admitting: Adult Health

## 2024-09-02 DIAGNOSIS — J9611 Chronic respiratory failure with hypoxia: Secondary | ICD-10-CM

## 2024-09-02 DIAGNOSIS — R41841 Cognitive communication deficit: Secondary | ICD-10-CM | POA: Diagnosis not present

## 2024-09-02 DIAGNOSIS — R1314 Dysphagia, pharyngoesophageal phase: Secondary | ICD-10-CM | POA: Diagnosis not present

## 2024-09-02 DIAGNOSIS — G4733 Obstructive sleep apnea (adult) (pediatric): Secondary | ICD-10-CM

## 2024-09-02 DIAGNOSIS — R918 Other nonspecific abnormal finding of lung field: Secondary | ICD-10-CM

## 2024-09-02 NOTE — Telephone Encounter (Signed)
 Please stay on 5 mg twice daily

## 2024-09-02 NOTE — Telephone Encounter (Signed)
 Ref #[id number]  Pt c/o medication issue:  1. Name of Medication: apixaban  (ELIQUIS ) 5 MG TABS tablet   2. How are you currently taking this medication (dosage and times per day)?    3. Are you having a reaction (difficulty breathing--STAT)? no  4. What is your medication issue? Calling to verify the dr wants that high of dose for patient been that she is 73. Stated the normal recommendation is 2.5

## 2024-09-02 NOTE — Telephone Encounter (Signed)
 Please review and advise.

## 2024-09-07 DIAGNOSIS — R1314 Dysphagia, pharyngoesophageal phase: Secondary | ICD-10-CM | POA: Diagnosis not present

## 2024-09-07 DIAGNOSIS — R41841 Cognitive communication deficit: Secondary | ICD-10-CM | POA: Diagnosis not present

## 2024-09-08 ENCOUNTER — Ambulatory Visit: Attending: Cardiology

## 2024-09-08 DIAGNOSIS — Z01810 Encounter for preprocedural cardiovascular examination: Secondary | ICD-10-CM

## 2024-09-08 NOTE — Progress Notes (Signed)
 Virtual Visit via Telephone Note   Because of [name] [name] co-morbid illnesses, she is at least at moderate risk for complications without adequate follow up.  This format is felt to be most appropriate for this patient at this time.  Due to technical limitations with video connection (technology), today's appointment will be conducted as an audio only telehealth visit, and ADDISYNN VASSELL verbally agreed to proceed in this manner.   All issues noted in this document were discussed and addressed.  No physical exam could be performed with this format.  Evaluation Performed:  Preoperative cardiovascular risk assessment _____________   Date:  09/08/2024   Patient ID:  [name] [name], DOB 10-25-1941, MRN [id number] Patient Location:  Home Provider location:   Office  Primary Care Provider:  Micheal Wolm ORN, MD Primary Cardiologist:  Jerel Balding, MD  Chief Complaint / Patient Profile   83 y.o. y/o female with a h/o paroxysmal atrial fibrillation, sick sinus syndrome, PPM, OSA who is pending endoscopy with dilation and presents today for telephonic preoperative cardiovascular risk assessment.  History of Present Illness    [name] [name] is a 83 y.o. female who presents via audio/video conferencing for a telehealth visit today.  Pt was last seen in cardiology clinic on 06/24/2024 by Dr. Balding.  At that time GRAYCEN DEGAN was doing well .  The patient is now pending procedure as outlined above. Since her last visit, she remains stable from a cardiac standpoint.  Today she denies chest pain,  lower extremity edema, fatigue, palpitations, melena, hematuria, hemoptysis, diaphoresis, weakness, presyncope, syncope, orthopnea, and PND.   Past Medical History    Past Medical History:  Diagnosis Date   Alpha-1-antitrypsin deficiency carrier    Arthritis    all over   Atherosclerosis of aorta    Atrial fibrillation (HCC)    Back pain    Complication of anesthesia    I woke up  during 2 different procedures (10/04/2014)   Depression    Diastolic dysfunction    Eating disorder    Frequent UTI    GERD (gastroesophageal reflux disease)    Heart disease    Hypertension    Hypertriglyceridemia    Insulin  resistance    Long term current use of anticoagulant    Multiple lung nodules    Muscular deconditioning    MVP (mitral valve prolapse)    Obesity    OSA on CPAP    Osteoarthritis    Osteopenia    unspecified location   Pacemaker    PAF (paroxysmal atrial fibrillation) (HCC)    Positive ANA (antinuclear antibody)    Presbyesophagus    Presence of permanent cardiac pacemaker    Primary osteoarthritis of both hands    neck and spine and hips   Pulmonary fibrosis (HCC)    followed by pulmonolgy   PVC's (premature ventricular contractions)    Shortness of breath    Sinus arrest 10/2014   s/p Medtronic Advisa model A2DR01 serial number [id number]   Sleep apnea    Stroke (HCC) 01/2014   denies deficits on 10/04/2014   Tachy-brady syndrome (HCC) 10/2014   TIA (transient ischemic attack)    Vitamin D  deficiency    Past Surgical History:  Procedure Laterality Date   CARDIOVERSION N/A 09/02/2017   Procedure: CARDIOVERSION;  Surgeon: Balding Jerel, MD;  Location: [facility];  Service: Cardiovascular;  Laterality: N/A;   COLONOSCOPY  2019   ESOPHAGEAL DILATION     EXCISION VAGINAL  CYST     benign nodules   INSERT / REPLACE / REMOVE PACEMAKER  10/04/2014   Medtronic Advisa model A2DR01 serial number [id number]   LOOP RECORDER EXPLANT N/A 10/04/2014   Procedure: LOOP RECORDER EXPLANT;  Surgeon: Jerel Balding, MD;  Location: [facility];  Service: Cardiovascular;  Laterality: N/A;   LOOP RECORDER IMPLANT N/A 04/19/2014   Procedure: LOOP RECORDER IMPLANT;  Surgeon: Jerel Balding, MD;  Location: [facility];  Service: Cardiovascular;  Laterality: N/A;   NM MYOCAR PERF WALL MOTION  12/18/2007   normal   PERMANENT PACEMAKER INSERTION N/A 10/04/2014    Procedure: PERMANENT PACEMAKER INSERTION;  Surgeon: Jerel Balding, MD;  Location: [facility];  Service: Cardiovascular;  Laterality: N/A;   TUBAL LIGATION     US  ECHOCARDIOGRAPHY  05/15/2010   LA mildly dilated,mild mitral annular ca+, AOV mildly sclerotic, mild asymmetric LVH    Allergies  Allergies  Allergen Reactions   Cefazolin  Hives and Itching    Other reaction(s): severe hives   Pseudoephedrine Hcl     Other reaction(s): palpitations (moderate)   Ergotamine     unknown   Other Other (See Comments)   Pentobarbital     unknown   Pirfenidone      unknown   Caffeine Palpitations    Home Medications    Prior to Admission medications   Medication Sig Start Date End Date Taking? Authorizing Provider  apixaban  (ELIQUIS ) 5 MG TABS tablet Take 1 tablet (5 mg total) by mouth 2 (two) times daily. 08/30/24   Croitoru, Mihai, MD  Cholecalciferol (VITAMIN D3 PO) Take 1 tablet by mouth daily.    [provider]  diazepam  (VALIUM ) 5 MG tablet TAKE 1 TABLET ONCE A DAY AS NEEDED. 04/27/24   Burchette, Wolm ORN, MD  dofetilide  (TIKOSYN ) 500 MCG capsule Take 1 capsule (500 mcg total) by mouth 2 (two) times daily. 04/20/24   Fenton, Clint R, PA  fluticasone  (FLONASE ) 50 MCG/ACT nasal spray 1 to 2 sprays per nostril once daily as needed for nasal congestion 04/16/24   Burchette, Wolm ORN, MD  fluticasone  (FLONASE ) 50 MCG/ACT nasal spray Place 1 spray into both nostrils daily. 06/18/24   Burchette, Wolm ORN, MD  ipratropium (ATROVENT ) 0.06 % nasal spray Place 2 sprays into both nostrils 4 (four) times daily as needed for rhinitis. 06/24/23   Sood, Vineet, MD  Magnesium  400 MG CAPS Take 400 mg by mouth daily. 03/15/22   Dow Arland BROCKS, NP  metoprolol  tartrate (LOPRESSOR ) 25 MG tablet Take 1/2 tablets (12.5 mg total) by mouth 2 (two) times daily. 12/09/23   Croitoru, Mihai, MD  nitroGLYCERIN  (NITROSTAT ) 0.4 MG SL tablet Place 1 tablet (0.4 mg total) under the tongue every 5 (five) minutes as needed  for chest pain. 06/24/24   Croitoru, Mihai, MD  nystatin  cream (MYCOSTATIN ) Apply to affected rash twice a day as needed 08/08/23   Burchette, Wolm ORN, MD  pantoprazole  (PROTONIX ) 40 MG tablet Take 1 tablet (40 mg total) by mouth 2 (two) times daily. 11/20/23   Cirigliano, Vito V, DO  predniSONE  (DELTASONE ) 10 MG tablet Please take prednisone  40 mg x1 day, then 30 mg x1 day, then 20 mg x1 day, then 10 mg x1 day, and then 5 mg x1 day and stop 08/17/24   Geronimo Amel, MD  sertraline  (ZOLOFT ) 100 MG tablet TAKE ONE TABLET BY MOUTH ONCE DAILY. 12/09/23   Micheal Wolm ORN, MD    Physical Exam    Vital Signs:  Erminio  S Kakos does not have vital signs available for review today.  Given telephonic nature of communication, physical exam is limited. AAOx3. NAD. Normal affect.  Speech and respirations are unlabored.  Accessory Clinical Findings    None  Assessment & Plan    1.  Preoperative Cardiovascular Risk Assessment:  ENDOSCOPY WITH DILATATION/ BOTOX   Date of Surgery:  Clearance TBD                                  Surgeon:  DR ESTELITA MANAS Surgeon's Group or Practice Name:  [facility] Phone number:  [phone] Fax number:  [phone]      Primary Cardiologist: Jerel Balding, MD  Chart reviewed as part of pre-operative protocol coverage. Given past medical history and time since last visit, based on ACC/AHA guidelines, ZAIYA ANNUNZIATO would be at acceptable risk for the planned procedure without further cardiovascular testing.   Patient was advised that if she develops new symptoms prior to surgery to contact our office to arrange a follow-up appointment.  She verbalized understanding.  Per office protocol, patient can hold Eliquis  for 1 day prior to procedure.   Patient will not need bridging with Lovenox  (enoxaparin ) around procedure.  I will route this recommendation to the requesting party via Epic fax function and remove from pre-op pool.      Time:   Today, I have  spent 6 minutes with the patient with telehealth technology discussing medical history, symptoms, and management plan. I spent 10 minutes reviewing patient's past cardiac history and cardiac medications.     Josefa CHRISTELLA Beauvais, NP  09/08/2024, 8:00 AM

## 2024-09-09 ENCOUNTER — Encounter (HOSPITAL_COMMUNITY): Payer: Self-pay | Admitting: Gastroenterology

## 2024-09-09 ENCOUNTER — Other Ambulatory Visit: Payer: Self-pay | Admitting: Gastroenterology

## 2024-09-09 DIAGNOSIS — R41841 Cognitive communication deficit: Secondary | ICD-10-CM | POA: Diagnosis not present

## 2024-09-09 DIAGNOSIS — R1314 Dysphagia, pharyngoesophageal phase: Secondary | ICD-10-CM | POA: Diagnosis not present

## 2024-09-10 ENCOUNTER — Other Ambulatory Visit: Payer: Self-pay | Admitting: Cardiovascular Disease

## 2024-09-13 NOTE — H&P (Signed)
 History of Present Illness  83 year old female, last seen in 3/23 for dysphagia. Barium swallow 02/27/2022 showed distal esophageal narrowing could be related to dysmotility, barium tablet could not pass. She was recommended to undergo EGD with Botox in 3/23. Virtual colonoscopy 3/23: Diverticulosis no polypoid lesion colonoscopy 2012- diverticulosis in sigmoid        colonoscopy 2007- diverticulosis in sigmoid        colonoscopy 2000-diverticulosis in sigmoid        Barium swallow with a pill 2011: small hiatal hernia and schatzki's ring She has issues with CHF, pulmonary fibrosis, arthritis, PHTN. She co difficulty swallowing, she feels she can barely eat, she most eats soup or soft food and chews a lot but food continues to get stuck in her throat. She has trouble with solids as well as liquids. She finds herself drinking gingerale but occasionally it regurgitates back. She feels she is losing weight, 2 years ago she was 276 lbs and now weighs around 236 lbs. She has regular Bms, denies blood in stool or black stools. She lives in a retirement committee at [facility]. She is on Eliquis . She takes omeprazole 40 mg twice a day for acid reflux and heartburn, she is not sure it that helps. She feels she spits up quite often including food and liquid.  Vital Signs Wt: 236.0, Wt change: -25.2 lbs, Ht: 66, BMI: 38.09, Temp: 97.7, Pulse sitting: 69, BP sitting: 110/73.  Examination GENERAL APPEARANCE: Well developed, well nourished, no active distress, pleasant.  SCLERA: anicteric.  CARDIOVASCULAR Normal RRR .  RESPIRATORY Breath sounds normal. Respiration even and unlabored.  ABDOMEN No masses palpated. Liver and spleen not palpated, normal. Bowel sounds normal, Abdomen not distended.  EXTREMITIES: No edema.  NEURO: alert, oriented to time, place and person, walks with a cane.  PSYCH: mood/affect normal.   Assessments 1. Dysphagia - R13.10 (Primary)    2. On apixaban  therapy -  Z79.01    Treatment 1. Dysphagia      IMAGING: EGD w/ DILATATION (Ordered for 08/06/2024)   IMAGING: EGD w/ Directed Submucosal Injection(s) (Ordered for 08/06/2024)     Notes: Recommend diagnostic endoscopy with esophageal biopsies to evaluate for eosinophilic esophagitis. Recommend balloon dilation during endoscopy if an obvious narrowing/stricture is noted.   2. On apixaban  therapy        Notes: Will get cardiac clearance to hold eliquis  prior to the procedure.

## 2024-09-13 NOTE — Anesthesia Preprocedure Evaluation (Signed)
 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 Left ventricular ejection fraction, by estimation, is 60 to 65%. The  left ventricle has normal function. The left ventricle has no regional  wall motion abnormalities. Left ventricular diastolic parameters are  consistent with Grade I diastolic  dysfunction (impaired relaxation).   2. Right ventricular systolic function is normal. The right ventricular  size is normal. There is normal pulmonary artery systolic pressure. The  estimated right ventricular systolic pressure is 8.0 mmHg.   3. The mitral valve is degenerative. Mild mitral valve regurgitation. No  evidence of mitral stenosis.   4. The aortic valve is tricuspid. Aortic valve regurgitation is not  visualized. Aortic valve sclerosis is present, with no evidence of aortic  valve stenosis.   5. The inferior vena cava is normal in size with greater than 50%  respiratory variability, suggesting right atrial pressure of 3 mmHg.      Neuro/Psych  PSYCHIATRIC DISORDERS Anxiety Depression    TIA   GI/Hepatic ,GERD  Medicated and Controlled,,  Endo/Other  neg diabetes    Renal/GU Renal disease     Musculoskeletal   Abdominal   Peds  Hematology   Anesthesia Other Findings   Reproductive/Obstetrics                               Anesthesia Physical Anesthesia Plan  ASA: 3  Anesthesia Plan: MAC   Post-op Pain Management:    Induction: Intravenous  PONV Risk Score and Plan: 2 and Propofol  infusion  Airway Management Planned: Simple Face Mask  Additional Equipment: None  Intra-op Plan:   Post-operative Plan: Extubation in OR  Informed Consent:      Dental advisory given  Plan Discussed with: CRNA and Surgeon  Anesthesia Plan Comments:          Anesthesia Quick Evaluation

## 2024-09-14 ENCOUNTER — Ambulatory Visit (HOSPITAL_COMMUNITY): Payer: Self-pay | Admitting: Anesthesiology

## 2024-09-14 ENCOUNTER — Other Ambulatory Visit: Payer: Self-pay

## 2024-09-14 ENCOUNTER — Encounter (HOSPITAL_COMMUNITY)
Admission: RE | Disposition: A | Discharge status: Home / Self Care | Payer: Self-pay | Source: Home / Self Care | Attending: Gastroenterology

## 2024-09-14 ENCOUNTER — Encounter (HOSPITAL_COMMUNITY): Payer: Self-pay | Admitting: Gastroenterology

## 2024-09-14 ENCOUNTER — Ambulatory Visit (HOSPITAL_COMMUNITY)
Admission: RE | Admit: 2024-09-14 | Discharge: 2024-09-14 | Disposition: A | Discharge status: Home / Self Care | Attending: Gastroenterology | Admitting: Gastroenterology

## 2024-09-14 DIAGNOSIS — I509 Heart failure, unspecified: Secondary | ICD-10-CM | POA: Insufficient documentation

## 2024-09-14 DIAGNOSIS — Q399 Congenital malformation of esophagus, unspecified: Secondary | ICD-10-CM | POA: Insufficient documentation

## 2024-09-14 DIAGNOSIS — F418 Other specified anxiety disorders: Secondary | ICD-10-CM | POA: Diagnosis not present

## 2024-09-14 DIAGNOSIS — Z7901 Long term (current) use of anticoagulants: Secondary | ICD-10-CM | POA: Diagnosis not present

## 2024-09-14 DIAGNOSIS — I272 Pulmonary hypertension, unspecified: Secondary | ICD-10-CM | POA: Diagnosis not present

## 2024-09-14 DIAGNOSIS — Z87891 Personal history of nicotine dependence: Secondary | ICD-10-CM

## 2024-09-14 DIAGNOSIS — I110 Hypertensive heart disease with heart failure: Secondary | ICD-10-CM | POA: Diagnosis not present

## 2024-09-14 DIAGNOSIS — T18128A Food in esophagus causing other injury, initial encounter: Secondary | ICD-10-CM | POA: Diagnosis not present

## 2024-09-14 DIAGNOSIS — R1310 Dysphagia, unspecified: Secondary | ICD-10-CM | POA: Diagnosis not present

## 2024-09-14 DIAGNOSIS — Z79899 Other long term (current) drug therapy: Secondary | ICD-10-CM | POA: Insufficient documentation

## 2024-09-14 DIAGNOSIS — J8410 Pulmonary fibrosis, unspecified: Secondary | ICD-10-CM | POA: Diagnosis not present

## 2024-09-14 DIAGNOSIS — W44F3XA Food entering into or through a natural orifice, initial encounter: Secondary | ICD-10-CM | POA: Diagnosis not present

## 2024-09-14 DIAGNOSIS — M1990 Unspecified osteoarthritis, unspecified site: Secondary | ICD-10-CM | POA: Diagnosis not present

## 2024-09-14 DIAGNOSIS — I10 Essential (primary) hypertension: Secondary | ICD-10-CM

## 2024-09-14 DIAGNOSIS — K2289 Other specified disease of esophagus: Secondary | ICD-10-CM | POA: Diagnosis not present

## 2024-09-14 DIAGNOSIS — K219 Gastro-esophageal reflux disease without esophagitis: Secondary | ICD-10-CM | POA: Insufficient documentation

## 2024-09-14 HISTORY — PX: ESOPHAGOGASTRODUODENOSCOPY: SHX5428

## 2024-09-14 HISTORY — PX: BOTOX INJECTION: SHX5754

## 2024-09-14 SURGERY — EGD (ESOPHAGOGASTRODUODENOSCOPY)
Anesthesia: Monitor Anesthesia Care

## 2024-09-14 MED ORDER — LIDOCAINE 2% (20 MG/ML) 5 ML SYRINGE
INTRAMUSCULAR | Status: DC | PRN
  Administered 2024-09-14 (×2): 50 mg via INTRAVENOUS

## 2024-09-14 MED ORDER — SODIUM CHLORIDE 0.9 % IV SOLN
INTRAVENOUS | Status: DC | PRN
  Administered 2024-09-14: 500 mL via INTRAMUSCULAR

## 2024-09-14 MED ORDER — ONDANSETRON HCL 4 MG/2ML IJ SOLN
INTRAMUSCULAR | Status: DC | PRN
  Administered 2024-09-14: 4 mg via INTRAVENOUS

## 2024-09-14 MED ORDER — ACETAMINOPHEN 10 MG/ML IV SOLN: 1000.0000 mg | Freq: Once | INTRAVENOUS | Status: DC | PRN

## 2024-09-14 MED ORDER — IPRATROPIUM-ALBUTEROL 0.5-2.5 (3) MG/3ML IN SOLN
RESPIRATORY_TRACT | Status: AC
  Filled 2024-09-14: qty 3

## 2024-09-14 MED ORDER — LACTATED RINGERS IV SOLN: INTRAVENOUS | Status: DC | PRN

## 2024-09-14 MED ORDER — PROPOFOL 10 MG/ML IV BOLUS
INTRAVENOUS | Status: DC | PRN
  Administered 2024-09-14: 30 mg via INTRAVENOUS
  Administered 2024-09-14: 20 mg via INTRAVENOUS
  Administered 2024-09-14: 100 mg via INTRAVENOUS

## 2024-09-14 MED ORDER — DEXAMETHASONE SODIUM PHOSPHATE 4 MG/ML IJ SOLN
INTRAMUSCULAR | Status: DC | PRN
  Administered 2024-09-14: 4 mg via INTRAVENOUS

## 2024-09-14 MED ORDER — OXYCODONE HCL 5 MG PO TABS: 5.0000 mg | ORAL_TABLET | Freq: Once | ORAL | Status: DC | PRN

## 2024-09-14 MED ORDER — SODIUM CHLORIDE 0.9 % IV SOLN: INTRAVENOUS | Status: DC

## 2024-09-14 MED ORDER — ONDANSETRON HCL 4 MG/2ML IJ SOLN: 4.0000 mg | Freq: Once | INTRAMUSCULAR | Status: DC | PRN

## 2024-09-14 MED ORDER — SUCCINYLCHOLINE CHLORIDE 200 MG/10ML IV SOSY
PREFILLED_SYRINGE | INTRAVENOUS | Status: DC | PRN
Start: 2024-09-14 — End: 2024-09-14
  Administered 2024-09-14: 100 mg via INTRAVENOUS

## 2024-09-14 MED ORDER — PROPOFOL 500 MG/50ML IV EMUL
INTRAVENOUS | Status: DC | PRN
Start: 2024-09-14 — End: 2024-09-14
  Administered 2024-09-14: 150 ug/kg/min via INTRAVENOUS

## 2024-09-14 MED ORDER — IPRATROPIUM-ALBUTEROL 0.5-2.5 (3) MG/3ML IN SOLN
3.0000 mL | Freq: Once | RESPIRATORY_TRACT | Status: AC
  Administered 2024-09-14: 3 mL via RESPIRATORY_TRACT

## 2024-09-14 MED ORDER — DROPERIDOL 2.5 MG/ML IJ SOLN: 0.6250 mg | Freq: Once | INTRAMUSCULAR | Status: DC | PRN

## 2024-09-14 MED ORDER — SODIUM CHLORIDE (PF) 0.9 % IJ SOLN
INTRAMUSCULAR | Status: AC
  Filled 2024-09-14: qty 10

## 2024-09-14 MED ORDER — FENTANYL CITRATE (PF) 100 MCG/2ML IJ SOLN: 25.0000 ug | INTRAMUSCULAR | Status: DC | PRN

## 2024-09-14 MED ORDER — OXYCODONE HCL 5 MG/5ML PO SOLN: 5.0000 mg | Freq: Once | ORAL | Status: DC | PRN

## 2024-09-14 MED ORDER — ONABOTULINUMTOXINA 100 UNITS IJ SOLR
INTRAMUSCULAR | Status: DC | PRN
  Administered 2024-09-14: 100 [IU] via INTRAMUSCULAR

## 2024-09-14 MED ORDER — ONABOTULINUMTOXINA 100 UNITS IJ SOLR
INTRAMUSCULAR | Status: AC
  Filled 2024-09-14: qty 100

## 2024-09-14 NOTE — Transfer of Care (Signed)
 Immediate Anesthesia Transfer of Care Note  Patient: [name]  Procedure(s) Performed: EGD (ESOPHAGOGASTRODUODENOSCOPY) BOTOX INJECTION BALLOON DILATION Balloon dilation wire-guided  Patient Location: PACU  Anesthesia Type:General  Level of Consciousness: drowsy and patient cooperative  Airway & Oxygen  Therapy: Patient Spontanous Breathing and Patient connected to face mask oxygen   Post-op Assessment: Report given to RN and Post -op Vital signs reviewed and stable  Post vital signs: Reviewed and stable  Last Vitals:  Vitals Value Taken Time  BP 143/73 09/14/24 08:31  Temp    Pulse 63 09/14/24 08:33  Resp 25 09/14/24 08:33  SpO2 98 % 09/14/24 08:33  Vitals shown include unfiled device data.  Last Pain:  Vitals:   09/14/24 0710  TempSrc: Temporal  PainSc: 0-No pain         Complications: No notable events documented.

## 2024-09-14 NOTE — Anesthesia Postprocedure Evaluation (Signed)
 Anesthesia Post Note  Patient: [name] [name]  Procedure(s) Performed: EGD (ESOPHAGOGASTRODUODENOSCOPY) BOTOX INJECTION     Patient location during evaluation: Endoscopy Anesthesia Type: General Level of consciousness: awake Pain management: pain level controlled Vital Signs Assessment: post-procedure vital signs reviewed and stable Respiratory status: spontaneous breathing, nonlabored ventilation and respiratory function stable Cardiovascular status: blood pressure returned to baseline Postop Assessment: no apparent nausea or vomiting Anesthetic complications: no Comments: Patient assessed several times throughout extended PACU stay. On arrival, increased wheezing on exam with SpO2 > 90% on room air and a DuoNeb was given with improvement in exam. Respiratory status at baseline throughout recovery stay with SpO2 > 90% on room air. Incentive spirometry performed throughout with adequate volumes. Discussed with patient and her friend at bedside concerns and signs/symptoms of developing pneumonia following aspiration. Advised to contact PCP or ED if developed a fever, worsening respiratory status or change in sputum production. Patient and friend understand concerns and instructions. Stable for discharge home.    No notable events documented.  Last Vitals:  Vitals:   09/14/24 1100 09/14/24 1110  BP: (!) 141/56 (!) 140/79  Pulse: 63 62  Resp: 13 20  Temp:  (!) 36.4 C  SpO2: 93% 91%    Last Pain:  Vitals:   09/14/24 1110  TempSrc: Oral  PainSc: 0-No pain                 Lauraine KATHEE [place]

## 2024-09-14 NOTE — Interval H&P Note (Signed)
 History and Physical Interval Note: 82/female with dysphagia and abnormal barium swallow for EGD with botox and possible balloon dilation with propofol .  09/14/2024 7:39 AM  [name] Mon  has presented today for EGD with botox and possible balloon dilation with propofol  with the diagnosis of R13.10 Dysphagia.  The various methods of treatment have been discussed with the patient and family. After consideration of risks, benefits and other options for treatment, the patient has consented to  Procedure(s): EGD (ESOPHAGOGASTRODUODENOSCOPY) (N/A) BOTOX INJECTION (N/A) BALLOON DILATION (N/A) Balloon dilation wire-guided (N/A) as a surgical intervention.  The patient's history has been reviewed, patient examined, no change in status, stable for surgery.  I have reviewed the patient's chart and labs.  Questions were answered to the patient's satisfaction.     [name] [name]

## 2024-09-14 NOTE — Anesthesia Procedure Notes (Signed)
 Procedure Name: Intubation Date/Time: 09/14/2024 7:58 AM  Performed by: Augusta Daved SAILOR, CRNAPre-anesthesia Checklist: Patient identified, Emergency Drugs available, Suction available and Patient being monitored Patient Re-evaluated:Patient Re-evaluated prior to induction Oxygen  Delivery Method: Circle System Utilized Preoxygenation: Pre-oxygenation with 100% oxygen  Induction Type: IV induction and Rapid sequence Laryngoscope Size: Glidescope and 1 Grade View: Grade II Tube type: Oral Tube size: 7.0 mm Number of attempts: 1 Airway Equipment and Method: Stylet and Oral airway Placement Confirmation: ETT inserted through vocal cords under direct vision, positive ETCO2 and breath sounds checked- equal and bilateral Secured at: 20 cm Tube secured with: Tape Dental Injury: Teeth and Oropharynx as per pre-operative assessment

## 2024-09-14 NOTE — Progress Notes (Addendum)
 Pt presented to endoscopy today for EGD with botox injection with MD [place]. Post-operatively, pt with end expiratory wheezing in upper lung fields, strong non-productive cough. O2 saturations 91-93% on RA.  MDA Calhoun assessed at bedside. VO for duoneb x1. Medication given as ordered.  [name] VEAR Pouch, RN 09/14/24 8:58 AM   Addendum [phone]: Pt has interval improvement in wheezing s/p duoneb administration. Pt denies dyspnea, states breathing feels at baseline. O2 saturation ranges from 89-95% on RA. Deep breathing, cough, and incentive spirometry encouraged. MDA Calhoun at bedside to assess. Per MDA Calhoun, continue to observe.   Addendum 1108: Pt remains stable with O2 saturations ranging from 90-97%. Pt denies dyspnea, continues to state that her breathing feels to be at baseline. MDA Calhoun at bedside, provided instruction to pt and friend Ival Pear. Pt discharged to home with instructions to return if she experiences s/s of PNA as well as standard post-EGD and post-general anesthesia instructions.  [name] VEAR Pouch, RN 09/14/24 11:38 AM

## 2024-09-14 NOTE — Discharge Instructions (Addendum)
 Restart your Eliquis  tomorrow (09/15/24)  YOU HAD AN ENDOSCOPIC PROCEDURE TODAY: Refer to the procedure report and other information in the discharge instructions given to you for any specific questions about what was found during the examination. If this information does not answer your questions, please call the [facility] office at [phone] to clarify.   YOU SHOULD EXPECT: Some feelings of bloating in the abdomen. Passage of more gas than usual. Walking can help get rid of the air that was put into your GI tract during the procedure and reduce the bloating.  DIET: Your first meal following the procedure should be a light meal and then it is ok to progress to your normal diet. A half-sandwich or bowl of soup is an example of a good first meal. Heavy or fried foods are harder to digest and may make you feel nauseous or bloated. Drink plenty of fluids but you should avoid alcoholic beverages for 24 hours.    ACTIVITY: Your care partner should take you home directly after the procedure. You should plan to take it easy, moving slowly for the rest of the day. You can resume normal activity the day after the procedure however YOU SHOULD NOT DRIVE, use power tools, machinery or perform tasks that involve climbing or major physical exertion for 24 hours (because of the sedation medicines used during the test).   SYMPTOMS TO REPORT IMMEDIATELY: A gastroenterologist can be reached at any hour. Please call [phone]  for any of the following symptoms:   Following upper endoscopy (EGD, EUS, ERCP, esophageal dilation) Vomiting of blood or coffee ground material  New, significant abdominal pain  New, significant chest pain or pain under the shoulder blades  Painful or persistently difficult swallowing  New shortness of breath  Black, tarry-looking or red, bloody stools  FOLLOW UP:  If any biopsies were taken you will be contacted by phone or by letter within the next 1-3 weeks. Call [phone]  if  you have not heard about the biopsies in 3 weeks.  Please also call with any specific questions about appointments or follow up tests.

## 2024-09-14 NOTE — Op Note (Signed)
 [facility] Patient Name: [name] [name] Procedure Date: 09/14/2024 MRN: [id number] Attending MD: Estelita Manas , MD, [id number] Date of Birth: 15-Feb-1941 CSN: [id number] Age: 83 Admit Type: Outpatient Procedure:                Upper GI endoscopy Indications:              Dysphagia Providers:                Estelita Manas, MD, Darice BIRCH. Bascom, RN, Corky Czech,                            Technician Referring MD:             Wolm Micheal COME Medicines:                Monitored Anesthesia Care Complications:            No immediate complications. Estimated blood loss:                            Minimal. Estimated Blood Loss:     Estimated blood loss was minimal. Procedure:                Pre-Anesthesia Assessment:                           - Prior to the procedure, a History and Physical                            was performed, and patient medications and                            allergies were reviewed. The patient's tolerance of                            previous anesthesia was also reviewed. The risks                            and benefits of the procedure and the sedation                            options and risks were discussed with the patient.                            All questions were answered, and informed consent                            was obtained. Prior Anticoagulants: The patient has                            taken Eliquis  (apixaban ), last dose was 2 days                            prior to procedure. ASA Grade Assessment: III - A  patient with severe systemic disease. After                            reviewing the risks and benefits, the patient was                            deemed in satisfactory condition to undergo the                            procedure.                           After obtaining informed consent, the endoscope was                            passed under direct vision. Throughout the                             procedure, the patient's blood pressure, pulse, and                            oxygen  saturations were monitored continuously. The                            [id number]-[id number] ([id number]) Olympus endoscope was introduced                            through the mouth, and advanced to the second part                            of duodenum. The [id number]-[id number] ([id number]) Olympus                            double channel endoscope was introduced through the                            and advanced to the. The upper GI endoscopy was                            accomplished without difficulty. The patient                            tolerated the procedure well.                           The patient started vomiting large amount of food                            as soon as the scope was advanced in her mouth.                           She was then intubated for airway protection. Scope In: Scope Out: Findings:      Food, semiliquid and solid was found in the entire esophagus. A double  channel therapeutic endoscope was used to suction most of the food to       allow luminal visualization.      The lumen of the esophagus was severely dilated. Findings suggestive of       achalasia.      The examined esophagus was significantly tortuous. There was no obvious       stricture noted.      An area, 1 cm above the GE junction(which was at 40 cm) was successfully       injected with 100 units botulinum toxin, 25 units/ml, 1 ml each in a       four quadrant fashion.      The entire examined stomach was normal.      The cardia and gastric fundus were normal on retroflexion.      The examined duodenum was normal. Impression:               - Food in the esophagus. Injected with botulinum                            toxin.                           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                           - Tortuous esophagus.                            - Normal stomach.                           - Normal examined duodenum.                           - No specimens collected. Moderate Sedation:      Patient did not receive moderate sedation for this procedure, but       instead received monitored anesthesia care. Recommendation:           - Patient has a contact number available for                            emergencies. The signs and symptoms of potential                            delayed complications were discussed with the                            patient. Return to normal activities tomorrow.                            Written discharge instructions were provided to the                            patient.                           -  Advance diet as tolerated.                           - Resume Eliquis  (apixaban ) at prior dose tomorrow. Procedure Code(s):        --- Professional ---                           [phone], Esophagogastroduodenoscopy, flexible,                            transoral; with directed submucosal injection(s),                            any substance Diagnosis Code(s):        --- Professional ---                           U81.871J, Food in esophagus causing other injury,                            initial encounter                           K22.89, Other specified disease of esophagus                           Q39.9, Congenital malformation of esophagus,                            unspecified                           R13.10, Dysphagia, unspecified CPT copyright 2022 [employer]. All rights reserved. The codes documented in this report are preliminary and upon coder review may  be revised to meet current compliance requirements. Estelita Manas, MD 09/14/2024 8:29:17 AM This report has been signed electronically. Number of Addenda: 0

## 2024-09-15 ENCOUNTER — Encounter (HOSPITAL_COMMUNITY): Payer: Self-pay | Admitting: Gastroenterology

## 2024-09-16 DIAGNOSIS — R41841 Cognitive communication deficit: Secondary | ICD-10-CM | POA: Diagnosis not present

## 2024-09-16 DIAGNOSIS — R1314 Dysphagia, pharyngoesophageal phase: Secondary | ICD-10-CM | POA: Diagnosis not present

## 2024-09-28 DIAGNOSIS — R41841 Cognitive communication deficit: Secondary | ICD-10-CM | POA: Diagnosis not present

## 2024-09-28 DIAGNOSIS — R1314 Dysphagia, pharyngoesophageal phase: Secondary | ICD-10-CM | POA: Diagnosis not present

## 2024-09-29 DIAGNOSIS — J8410 Pulmonary fibrosis, unspecified: Secondary | ICD-10-CM | POA: Diagnosis not present

## 2024-09-29 DIAGNOSIS — I2510 Atherosclerotic heart disease of native coronary artery without angina pectoris: Secondary | ICD-10-CM | POA: Diagnosis not present

## 2024-10-06 ENCOUNTER — Ambulatory Visit: Payer: Medicare Other

## 2024-10-06 DIAGNOSIS — I480 Paroxysmal atrial fibrillation: Secondary | ICD-10-CM

## 2024-10-08 ENCOUNTER — Ambulatory Visit: Payer: Self-pay | Admitting: Internal Medicine

## 2024-10-08 LAB — CUP PACEART REMOTE DEVICE CHECK
Battery Remaining Longevity: 9 mo
Battery Voltage: 2.87 V
Brady Statistic AP VP Percent: 0.16 %
Brady Statistic AP VS Percent: 96.1 %
Brady Statistic AS VP Percent: 0 %
Brady Statistic AS VS Percent: 3.74 %
Brady Statistic RA Percent Paced: 96.15 %
Brady Statistic RV Percent Paced: 0.2 %
Date Time Interrogation Session: 20251107123154
Implantable Lead Connection Status: 753985
Implantable Lead Connection Status: 753985
Implantable Lead Implant Date: 20151103
Implantable Lead Implant Date: 20151103
Implantable Lead Location: 753859
Implantable Lead Location: 753860
Implantable Lead Model: 5076
Implantable Lead Model: 5076
Implantable Pulse Generator Implant Date: 20151103
Lead Channel Impedance Value: 418 Ohm
Lead Channel Impedance Value: 456 Ohm
Lead Channel Impedance Value: 494 Ohm
Lead Channel Impedance Value: 513 Ohm
Lead Channel Pacing Threshold Amplitude: 0.5 V
Lead Channel Pacing Threshold Amplitude: 0.625 V
Lead Channel Pacing Threshold Pulse Width: 0.4 ms
Lead Channel Pacing Threshold Pulse Width: 0.4 ms
Lead Channel Sensing Intrinsic Amplitude: 3.625 mV
Lead Channel Sensing Intrinsic Amplitude: 3.625 mV
Lead Channel Sensing Intrinsic Amplitude: 6.875 mV
Lead Channel Sensing Intrinsic Amplitude: 6.875 mV
Lead Channel Setting Pacing Amplitude: 1.5 V
Lead Channel Setting Pacing Amplitude: 2 V
Lead Channel Setting Pacing Pulse Width: 0.4 ms
Lead Channel Setting Sensing Sensitivity: 2.8 mV
Zone Setting Status: 755011
Zone Setting Status: 755011

## 2024-10-08 NOTE — Telephone Encounter (Addendum)
 FYI Only or Action Required?: Action required by provider: update on patient condition. Appointment scheduled for 10/11/2024  Patient is followed in Pulmonology for pulmonary fibrosis, last seen on 03/04/2024 by [name] Amel, MD.  Called Nurse Triage reporting Cough.  Symptoms began today.  Interventions attempted: Home oxygen  use and Increased fluids/rest.  Symptoms are: gradually improving.  Triage Disposition: See PCP When Office is Open (Within 3 Days)  Patient/caregiver understands and will follow disposition?: Yes  Patient/caregiver understands and will follow disposition?: Yes  Copied from CRM #[id number]. Topic: Clinical - Red Word Triage >> Oct 08, 2024  1:57 PM Russell PARAS wrote:  Patient states that she was on the phone talking and laughing for about an hour and this caused her cough to flare up  Red Word that prompted transfer to Nurse Triage:   Is seeing blood in sputum, starting about an hour More SOB than normal, history of pulmonary fibrosis More difficulty breathing  On Eliquis   Pt of [place] Reason for Disposition  [1] Coughed up blood-tinged sputum AND [2] more than once  Answer Assessment - Initial Assessment Questions 1. ONSET: When did the cough begin?      Today, one hour ago 2. SEVERITY: How bad is the cough today?      Patient has been coughing really hard 3. SPUTUM: Describe the color of your sputum (e.g., none, dry cough; clear, white, yellow, green)     Green with blood flecks.  States that it stays green 4. HEMOPTYSIS: Are you coughing up any blood? If Yes, ask: How much? (e.g., flecks, streaks, tablespoons, etc.)     Began as a fleck of blood, but has had a streak as well 5. DIFFICULTY BREATHING: Are you having difficulty breathing? If Yes, ask: How bad is it? (e.g., mild, moderate, severe)      Breathing feels worse, increased SOB at rest, talking causes her to get out of air Oxygen  saturation 93% on room air, patient does have home  oxygen  6. FEVER: Do you have a fever? If Yes, ask: What is your temperature, how was it measured, and when did it start?     denies 7. CARDIAC HISTORY: Do you have any history of heart disease? (e.g., heart attack, congestive heart failure)      TIA, HTN 8. LUNG HISTORY: Do you have any history of lung disease?  (e.g., pulmonary embolus, asthma, emphysema)     Pulmonary fibrosis 9. PE RISK FACTORS: Do you have a history of blood clots? (or: recent major surgery, recent prolonged travel, bedridden)     N/a 10. OTHER SYMPTOMS: Do you have any other symptoms? (e.g., runny nose, wheezing, chest pain)       Positive for stuffy nose, all other symptoms negative  Protocols used: Cough - Acute Productive-A-AH

## 2024-10-09 ENCOUNTER — Ambulatory Visit: Payer: Self-pay | Admitting: Cardiovascular Disease

## 2024-10-11 NOTE — Progress Notes (Signed)
 Remote PPM Transmission

## 2024-10-18 ENCOUNTER — Other Ambulatory Visit (HOSPITAL_COMMUNITY): Payer: Self-pay | Admitting: *Deleted

## 2024-10-18 MED ORDER — DOFETILIDE 500 MCG PO CAPS: 500.0000 ug | ORAL_CAPSULE | Freq: Two times a day (BID) | ORAL | 1 refills | Status: AC

## 2024-10-30 DIAGNOSIS — J8410 Pulmonary fibrosis, unspecified: Secondary | ICD-10-CM | POA: Diagnosis not present

## 2024-10-30 DIAGNOSIS — I2510 Atherosclerotic heart disease of native coronary artery without angina pectoris: Secondary | ICD-10-CM | POA: Diagnosis not present

## 2024-11-02 ENCOUNTER — Ambulatory Visit: Payer: Self-pay

## 2024-11-02 NOTE — Telephone Encounter (Signed)
 FYI Only or Action Required?: FYI only for provider: ED advised. Patient refused  Patient was last seen in primary care on 06/22/2024 by Micheal Wolm ORN, MD.  Called Nurse Triage reporting Shortness of Breath.  Symptoms began several days ago.  Interventions attempted: Nothing.  Symptoms are: gradually worsening.  Triage Disposition: Go to ED Now (Notify PCP)  Patient/caregiver understands and will follow disposition?:   Copied from CRM #[id number]. Topic: Clinical - Red Word Triage >> Nov 02, 2024  9:38 AM Berneda FALCON wrote: Red Word that prompted transfer to Nurse Triage: Patient states she is having shortness of breath gotten worse in the last month or so. She also mentions extreme fatigue. Reason for Disposition  [1] MODERATE difficulty breathing (e.g., speaks in phrases, SOB even at rest, pulse 100-120) AND [2] NEW-onset or WORSE than normal  Answer Assessment - Initial Assessment Questions Advised ED now. Patient declines and reports I do not have the energy to go there and sit and wait for 5 to 6 hours, I just want to see Dr. Micheal. Nurse offered to call 911, patient declines and request call back/appt.  Advised 911 if symptoms worsen, patient verbalized understanding.  1. RESPIRATORY STATUS: Describe your breathing? (e.g., wheezing, shortness of breath, unable to speak, severe coughing)      Audible sob at rest, my breathing is getting worse.  9. OTHER SYMPTOMS: Do you have any other symptoms? (e.g., chest pain, cough, dizziness, fever, runny nose)     Denies chest pain, feeling faint at this time but reports does feel faint when getting up; orthostatic pt reports 10. O2 SATURATION MONITOR:  Do you use an oxygen  saturation monitor (pulse oximeter) at home? If Yes, ask: What is your reading (oxygen  level) today? What is your usual oxygen  saturation reading? (e.g., 95%)       91% RA, HR 75 oxygen  2LNC  Protocols used: Breathing Difficulty-A-AH

## 2024-11-03 ENCOUNTER — Encounter: Payer: Self-pay | Admitting: Family Medicine

## 2024-11-03 ENCOUNTER — Ambulatory Visit

## 2024-11-03 ENCOUNTER — Ambulatory Visit: Admitting: Family Medicine

## 2024-11-03 ENCOUNTER — Ambulatory Visit: Payer: Self-pay | Admitting: Family Medicine

## 2024-11-03 VITALS — BP 126/74 | HR 80 | Temp 97.5°F | Ht 63.0 in

## 2024-11-03 DIAGNOSIS — R0602 Shortness of breath: Secondary | ICD-10-CM

## 2024-11-03 DIAGNOSIS — I480 Paroxysmal atrial fibrillation: Secondary | ICD-10-CM | POA: Diagnosis not present

## 2024-11-03 DIAGNOSIS — J8410 Pulmonary fibrosis, unspecified: Secondary | ICD-10-CM

## 2024-11-03 DIAGNOSIS — R5383 Other fatigue: Secondary | ICD-10-CM

## 2024-11-03 DIAGNOSIS — J9611 Chronic respiratory failure with hypoxia: Secondary | ICD-10-CM

## 2024-11-03 DIAGNOSIS — R918 Other nonspecific abnormal finding of lung field: Secondary | ICD-10-CM | POA: Diagnosis not present

## 2024-11-03 NOTE — Progress Notes (Signed)
 Established Patient Office Visit   Subjective  [name], female    DOB: Sep 06, 1941  Age: 83 y.o. MRN: [id number]  Chief Complaint  Patient presents with   Acute Visit    Patient just doesn't feel well, started 5 weeks ago, patient has SOB green mucus,     Pt is an 83 yo female with pmh sig for paroxysmal A-fib, atherosclerosis, history of TIA, OSA on CPAP, pulmonary fibrosis, multiple lung nodules, GERD, chronic respiratory failure with hypoxia on 2 L O2 continuously, osteopenia, arthritis, HLD, cognitive impairment, diastolic dysfunction who is followed by Dr. Micheal.  Pt scheduled for acute concern but endorses ongoing symptoms.    Pt endorses feeling weaker and that lungs feel worse x 4-5 weeks.  States may have had a cold when symptoms started.  Was also wheezing and coughing.  Still coughing frequently in AM.  Feels like legs are wobbly when walking.  Patient is unsure if her pulse oximeter is working correctly at home.  States O2 was 89% on her normal 2 L of O2 continuously.  In the past inhalers caused thrush.  Patient mentions recent neurology visit where she was noted to have Lewy body dementia.  Feels like she has all the symptoms.  Patient advised LBD on DDx per chart review.  On Tikosyn  for A-fib.  Patient denies current A-fib symptoms though mentions she has never felt palpitations prior to her diagnosis.  Also mentions heart failure.  Denies LE edema.      Patient Active Problem List   Diagnosis Date Noted   Cognitive impairment 08/31/2024   Visual hallucination 08/31/2024   Cerebral atherosclerosis 06/11/2023   Viral URI 11/15/2022   Heart disease 10/29/2022   Hypercoagulable state due to paroxysmal atrial fibrillation (HCC) 03/05/2022   Paroxysmal A-fib (HCC) 03/05/2022   Rectal bleeding 09/18/2021   Chronic respiratory failure with hypoxia (HCC) 04/05/2021   Alpha-1-antitrypsin deficiency carrier 07/29/2020   Arthritis 12/12/2019   Muscular  deconditioning 05/30/2019   Presbyesophagus 03/05/2019   ASCVD (arteriosclerotic cardiovascular disease) risk score 18.9% 11/22/2018   Depression 10/15/2018   Seasonal allergies, using Flonase  and Loratadine  prn 10/15/2018   Atherosclerosis of aorta 10/15/2018   Diastolic dysfunction 10/15/2018   Insulin  resistance 11/05/2017   Vitamin D  deficiency 09/17/2017   Class 3 severe obesity with serious comorbidity and body mass index (BMI) of 40.0 to 44.9 in adult [facility]) 09/17/2017   Hypertriglyceridemia 09/17/2017   Multiple lung nodules 05/23/2017   Pulmonary fibrosis (HCC), followed by Pulmonolgy 05/23/2017   Abnormal CT scan of lung 01/07/2017   GERD (gastroesophageal reflux disease) 12/12/2016   Dyspnea on exertion 12/10/2016   Anxiety state 07/15/2016   OSA on CPAP, compliant 05/04/2016   Pacemaker 10/04/2014   Encounter for monitoring dofetilide  therapy 06/27/2014   Paroxysmal atrial fibrillation (HCC) 06/06/2014   Tachy-brady syndrome (HCC) 06/06/2014   TIA (transient ischemic attack) 03/08/2014   Osteopenia 08/15/2008   Acute bronchitis 01/07/2008   Past Medical History:  Diagnosis Date   Alpha-1-antitrypsin deficiency carrier    Arthritis    all over   Atherosclerosis of aorta    Atrial fibrillation (HCC)    Back pain    Complication of anesthesia    I woke up during 2 different procedures (10/04/2014)   Depression    Diastolic dysfunction    Eating disorder    Frequent UTI    GERD (gastroesophageal reflux disease)    Heart disease    Hypertension  Hypertriglyceridemia    Insulin  resistance    Long term current use of anticoagulant    Multiple lung nodules    Muscular deconditioning    MVP (mitral valve prolapse)    Obesity    OSA on CPAP    Osteoarthritis    Osteopenia    unspecified location   Pacemaker    PAF (paroxysmal atrial fibrillation) (HCC)    Positive ANA (antinuclear antibody)    Presbyesophagus    Presence of permanent cardiac pacemaker     Primary osteoarthritis of both hands    neck and spine and hips   Pulmonary fibrosis (HCC)    followed by pulmonolgy   PVC's (premature ventricular contractions)    Shortness of breath    Sinus arrest 10/2014   s/p Medtronic Advisa model A2DR01 serial number [id number]   Sleep apnea    Stroke (HCC) 01/2014   denies deficits on 10/04/2014   Tachy-brady syndrome (HCC) 10/2014   TIA (transient ischemic attack)    Vitamin D  deficiency    Past Surgical History:  Procedure Laterality Date   BOTOX  INJECTION N/A 09/14/2024   Procedure: BOTOX  INJECTION;  Surgeon: Saintclair Jasper, MD;  Location: [facility];  Service: Gastroenterology;  Laterality: N/A;   CARDIOVERSION N/A 09/02/2017   Procedure: CARDIOVERSION;  Surgeon: Francyne Headland, MD;  Location: [facility];  Service: Cardiovascular;  Laterality: N/A;   COLONOSCOPY  2019   ESOPHAGEAL DILATION     ESOPHAGOGASTRODUODENOSCOPY N/A 09/14/2024   Procedure: EGD (ESOPHAGOGASTRODUODENOSCOPY);  Surgeon: Saintclair Jasper, MD;  Location: [facility];  Service: Gastroenterology;  Laterality: N/A;   EXCISION VAGINAL CYST     benign nodules   INSERT / REPLACE / REMOVE PACEMAKER  10/04/2014   Medtronic Advisa model A2DR01 serial number [id number]   LOOP RECORDER EXPLANT N/A 10/04/2014   Procedure: LOOP RECORDER EXPLANT;  Surgeon: Headland Francyne, MD;  Location: [facility];  Service: Cardiovascular;  Laterality: N/A;   LOOP RECORDER IMPLANT N/A 04/19/2014   Procedure: LOOP RECORDER IMPLANT;  Surgeon: Headland Francyne, MD;  Location: [facility];  Service: Cardiovascular;  Laterality: N/A;   NM MYOCAR PERF WALL MOTION  12/18/2007   normal   PERMANENT PACEMAKER INSERTION N/A 10/04/2014   Procedure: PERMANENT PACEMAKER INSERTION;  Surgeon: Headland Francyne, MD;  Location: [facility];  Service: Cardiovascular;  Laterality: N/A;   TUBAL LIGATION     US  ECHOCARDIOGRAPHY  05/15/2010   LA mildly dilated,mild mitral annular ca+, AOV mildly sclerotic, mild asymmetric LVH    Social History   Tobacco Use   Smoking status: Former    Current packs/day: 0.00    Average packs/day: 2.0 packs/day for 18.0 years (36.0 ttl pk-yrs)    Types: Cigarettes    Start date: 07/18/1952    Quit date: 07/18/1970    Years since quitting: 54.3   Smokeless tobacco: Never   Tobacco comments:    Former smoker 07/18/22 quit smoking in 1971  Vaping Use   Vaping status: Never Used  Substance Use Topics   Alcohol use: Yes    Alcohol/week: 0.0 standard drinks of alcohol    Comment: 10/04/2014 might have a drink a couple times/yr   Drug use: No   Family History  Problem Relation Age of Onset   Heart attack Mother    Sudden death Mother    Stroke Father    Heart failure Father    Alcoholism Brother    Healthy Sister    Stroke Brother    Alpha-1 antitrypsin deficiency  Daughter    Breast cancer Daughter    Prostate cancer Brother    Stroke Brother    Prostate cancer Brother    Healthy Brother    Prostate cancer Brother    Healthy Daughter    Diabetes Son    Arthritis Son    Allergies  Allergen Reactions   Cefazolin  Hives and Itching    Other reaction(s): severe hives   Pseudoephedrine Hcl     Other reaction(s): palpitations (moderate)   Ergotamine     unknown   Other Other (See Comments)   Pentobarbital     unknown   Pirfenidone      unknown   Caffeine Palpitations    ROS Negative unless stated above    Objective:     BP 126/74 (BP Location: Left Arm, Patient Position: Sitting, Cuff Size: Large)   Pulse 80   Temp (!) 97.5 F (36.4 C) (Oral)   Ht 5' 3 (1.6 m)   LMP  (LMP Unknown)   SpO2 94%   BMI 40.93 kg/m   Pt unable to stand on scale. BP Readings from Last 3 Encounters:  11/03/24 126/74  09/14/24 (!) 140/79  08/31/24 130/74   Wt Readings from Last 3 Encounters:  09/14/24 231 lb 0.7 oz (104.8 kg)  08/31/24 231 lb (104.8 kg)  07/13/24 240 lb 12.8 oz (109.2 kg)      Physical Exam Constitutional:      General: She is not in acute  distress.    Appearance: Normal appearance. She is not ill-appearing or toxic-appearing.  HENT:     Head: Normocephalic and atraumatic.     Nose: Nose normal.     Mouth/Throat:     Mouth: Mucous membranes are moist.  Cardiovascular:     Rate and Rhythm: Normal rate. Rhythm irregular.     Heart sounds: Normal heart sounds. No murmur heard.    No gallop.     Comments: Trace edema of b/l LEs to shin.  HR with 2 irregular beats. Pulmonary:     Effort: Pulmonary effort is normal. No respiratory distress.     Breath sounds: Normal breath sounds. No wheezing, rhonchi or rales.     Comments: On 2 L O2 via portable concentrator.  No increased WOB. Skin:    General: Skin is warm and dry.  Neurological:     Mental Status: She is alert and oriented to person, place, and time.        06/22/2024    5:07 PM 03/11/2024    3:53 PM 03/09/2024    3:29 PM  Depression screen PHQ 2/9  Decreased Interest 2 0 2  Down, Depressed, Hopeless 1 1 1   PHQ - 2 Score 3 1 3   Altered sleeping 2  2  Tired, decreased energy 3    Change in appetite 2    Feeling bad or failure about yourself  0    Trouble concentrating 1    Moving slowly or fidgety/restless 0    Suicidal thoughts 1    PHQ-9 Score 12   5   Difficult doing work/chores Not difficult at all       Data saved with a previous flowsheet row definition      01/04/2019   12:05 PM 12/17/2018    3:53 PM 12/03/2018   12:11 PM 11/05/2018    9:38 AM  GAD 7 : Generalized Anxiety Score  Nervous, Anxious, on Edge 1 2 1 1   Control/stop worrying 3 2 2 1   Worry  too much - different things 2 1 2 1   Trouble relaxing 3 1 2 1   Restless 0 0 0 0  Easily annoyed or irritable 1 0 0 0  Afraid - awful might happen 0 0 0 0  Total GAD 7 Score 10 6 7 4   Anxiety Difficulty Somewhat difficult Not difficult at all Somewhat difficult Somewhat difficult     No results found for any visits on 11/03/24.    Assessment & Plan:   Pulmonary fibrosis (HCC), followed by  Pulmonolgy -     DG Chest 2 View; Future  Fatigue, unspecified type -     DG Chest 2 View; Future  SOB (shortness of breath) on exertion -     DG Chest 2 View; Future  Paroxysmal A-fib (HCC)  Chronic respiratory failure with hypoxia (HCC)  Pt seen for ongoing concerns of fatigue, SOB, weakness x 1 month or more.  No increased O2 requirement noted.  VS stable.  Exam reassuring.  Still consider PNA, bronchitis, CHF exacerbation, deconditioning.  Obtain CXR given symptoms and history.  Further recs if needed based on results such as prednisone  taper.  Continue 2L O2 via [place].  F/u with PCP and Pulm.  A-fib stable.  Continue current medicines including Tikosyn  500 mg, Eliquis  5 mg, Lopressor  25 mg twice daily.    Return if symptoms worsen or fail to improve.   Clotilda JONELLE Single, MD

## 2024-11-03 NOTE — Telephone Encounter (Signed)
 I spoke with the patient and she reported ongoing SOB for the past 3-4 weeks. Patient denies any chest pain but was advised to visit nearest ED for evaluation.  Patient refused ED evaluation at this time and requested appt today. Patient was scheduled to see Dr. Mercer today

## 2024-11-04 NOTE — Telephone Encounter (Signed)
 Noted.  [name] Covey MD [facility] at [facility]

## 2024-11-30 ENCOUNTER — Encounter: Payer: Self-pay | Admitting: *Deleted

## 2024-12-04 ENCOUNTER — Other Ambulatory Visit: Payer: Self-pay | Admitting: Family Medicine

## 2024-12-04 DIAGNOSIS — F331 Major depressive disorder, recurrent, moderate: Secondary | ICD-10-CM

## 2024-12-07 ENCOUNTER — Telehealth: Payer: Self-pay | Admitting: *Deleted

## 2024-12-07 NOTE — Telephone Encounter (Signed)
 I called pt not able to leave a message

## 2024-12-17 ENCOUNTER — Telehealth: Payer: Self-pay

## 2024-12-17 NOTE — Telephone Encounter (Signed)
 Received CMN from [employer] for oxygen  for pt. Per LOV notes:  Fibrosis is slowly and mildly progressive over a year or 2 but not at a point where you are requiring oxygen .  CMN not signed

## 2025-01-05 ENCOUNTER — Ambulatory Visit

## 2025-01-06 ENCOUNTER — Ambulatory Visit: Payer: Self-pay | Admitting: Cardiovascular Disease

## 2025-01-06 LAB — CUP PACEART REMOTE DEVICE CHECK
Battery Remaining Longevity: 6 mo
Battery Voltage: 2.86 V
Brady Statistic AP VP Percent: 0.27 %
Brady Statistic AP VS Percent: 96.73 %
Brady Statistic AS VP Percent: 0.01 %
Brady Statistic AS VS Percent: 2.99 %
Brady Statistic RA Percent Paced: 96.8 %
Brady Statistic RV Percent Paced: 0.37 %
Date Time Interrogation Session: 20260205001752
Implantable Lead Connection Status: 753985
Implantable Lead Connection Status: 753985
Implantable Lead Implant Date: 20151103
Implantable Lead Implant Date: 20151103
Implantable Lead Location: 753859
Implantable Lead Location: 753860
Implantable Lead Model: 5076
Implantable Lead Model: 5076
Implantable Pulse Generator Implant Date: 20151103
Lead Channel Impedance Value: 399 Ohm
Lead Channel Impedance Value: 456 Ohm
Lead Channel Impedance Value: 475 Ohm
Lead Channel Impedance Value: 494 Ohm
Lead Channel Pacing Threshold Amplitude: 0.5 V
Lead Channel Pacing Threshold Amplitude: 0.625 V
Lead Channel Pacing Threshold Pulse Width: 0.4 ms
Lead Channel Pacing Threshold Pulse Width: 0.4 ms
Lead Channel Sensing Intrinsic Amplitude: 3.75 mV
Lead Channel Sensing Intrinsic Amplitude: 3.75 mV
Lead Channel Sensing Intrinsic Amplitude: 7.25 mV
Lead Channel Sensing Intrinsic Amplitude: 7.25 mV
Lead Channel Setting Pacing Amplitude: 1.5 V
Lead Channel Setting Pacing Amplitude: 2 V
Lead Channel Setting Pacing Pulse Width: 0.4 ms
Lead Channel Setting Sensing Sensitivity: 2.8 mV
Zone Setting Status: 755011
Zone Setting Status: 755011

## 2025-01-18 ENCOUNTER — Ambulatory Visit (HOSPITAL_COMMUNITY): Admitting: Physician Assistant

## 2025-02-07 ENCOUNTER — Ambulatory Visit

## 2025-03-10 ENCOUNTER — Ambulatory Visit

## 2025-04-06 ENCOUNTER — Ambulatory Visit

## 2025-07-06 ENCOUNTER — Ambulatory Visit

## 2025-10-05 ENCOUNTER — Ambulatory Visit

## 2026-01-04 ENCOUNTER — Ambulatory Visit
